# Patient Record
Sex: Female | Born: 1945
Health system: Southern US, Community
[De-identification: ages and names within clinical notes are randomized; demographics above are authoritative.]

## PROBLEM LIST (undated history)

## (undated) DIAGNOSIS — I200 Unstable angina: Secondary | ICD-10-CM

## (undated) DIAGNOSIS — I219 Acute myocardial infarction, unspecified: Secondary | ICD-10-CM

## (undated) DIAGNOSIS — J42 Unspecified chronic bronchitis: Secondary | ICD-10-CM

## (undated) DIAGNOSIS — I2510 Atherosclerotic heart disease of native coronary artery without angina pectoris: Secondary | ICD-10-CM

## (undated) DIAGNOSIS — R079 Chest pain, unspecified: Secondary | ICD-10-CM

## (undated) DIAGNOSIS — J189 Pneumonia, unspecified organism: Secondary | ICD-10-CM

## (undated) DIAGNOSIS — R7303 Prediabetes: Secondary | ICD-10-CM

## (undated) DIAGNOSIS — J449 Chronic obstructive pulmonary disease, unspecified: Secondary | ICD-10-CM

## (undated) DIAGNOSIS — D649 Anemia, unspecified: Secondary | ICD-10-CM

## (undated) DIAGNOSIS — I739 Peripheral vascular disease, unspecified: Secondary | ICD-10-CM

## (undated) DIAGNOSIS — J90 Pleural effusion, not elsewhere classified: Secondary | ICD-10-CM

## (undated) DIAGNOSIS — I771 Stricture of artery: Secondary | ICD-10-CM

## (undated) DIAGNOSIS — E785 Hyperlipidemia, unspecified: Secondary | ICD-10-CM

## (undated) DIAGNOSIS — I10 Essential (primary) hypertension: Secondary | ICD-10-CM

## (undated) DIAGNOSIS — Z9861 Coronary angioplasty status: Secondary | ICD-10-CM

## (undated) DIAGNOSIS — M1990 Unspecified osteoarthritis, unspecified site: Secondary | ICD-10-CM

## (undated) DIAGNOSIS — R911 Solitary pulmonary nodule: Secondary | ICD-10-CM

## (undated) DIAGNOSIS — I469 Cardiac arrest, cause unspecified: Secondary | ICD-10-CM

## (undated) DIAGNOSIS — R918 Other nonspecific abnormal finding of lung field: Secondary | ICD-10-CM

## (undated) DIAGNOSIS — F1110 Opioid abuse, uncomplicated: Secondary | ICD-10-CM

## (undated) DIAGNOSIS — Z720 Tobacco use: Secondary | ICD-10-CM

## (undated) DIAGNOSIS — I5042 Chronic combined systolic (congestive) and diastolic (congestive) heart failure: Secondary | ICD-10-CM

## (undated) HISTORY — PX: FRACTURE SURGERY: SHX138

## (undated) HISTORY — DX: Solitary pulmonary nodule: R91.1

## (undated) HISTORY — DX: Unstable angina: I20.0

## (undated) HISTORY — PX: ABDOMINAL HYSTERECTOMY: SHX81

## (undated) HISTORY — PX: CORONARY ANGIOPLASTY WITH STENT PLACEMENT: SHX49

## (undated) HISTORY — DX: Anemia, unspecified: D64.9

## (undated) HISTORY — DX: Pleural effusion, not elsewhere classified: J90

## (undated) HISTORY — DX: Tobacco use: Z72.0

## (undated) HISTORY — DX: Prediabetes: R73.03

## (undated) HISTORY — DX: Hyperlipidemia, unspecified: E78.5

## (undated) HISTORY — DX: Chronic obstructive pulmonary disease, unspecified: J44.9

## (undated) HISTORY — DX: Other nonspecific abnormal finding of lung field: R91.8

## (undated) HISTORY — DX: Chronic combined systolic (congestive) and diastolic (congestive) heart failure: I50.42

## (undated) HISTORY — PX: ANKLE FRACTURE SURGERY: SHX122

## (undated) HISTORY — DX: Peripheral vascular disease, unspecified: I73.9

## (undated) HISTORY — DX: Cardiac arrest, cause unspecified: I46.9

## (undated) HISTORY — DX: Chest pain, unspecified: R07.9

## (undated) HISTORY — DX: Stricture of artery: I77.1

---

## 2007-05-16 DIAGNOSIS — I219 Acute myocardial infarction, unspecified: Secondary | ICD-10-CM

## 2007-05-16 HISTORY — DX: Acute myocardial infarction, unspecified: I21.9

## 2009-01-13 DIAGNOSIS — I469 Cardiac arrest, cause unspecified: Secondary | ICD-10-CM

## 2009-01-13 HISTORY — DX: Cardiac arrest, cause unspecified: I46.9

## 2009-01-14 ENCOUNTER — Inpatient Hospital Stay (HOSPITAL_COMMUNITY): Admission: EM | Admit: 2009-01-14 | Discharge: 2009-01-16 | Payer: Self-pay | Admitting: Emergency Medicine

## 2009-01-14 ENCOUNTER — Ambulatory Visit: Payer: Self-pay | Admitting: Cardiology

## 2010-07-04 ENCOUNTER — Observation Stay (HOSPITAL_COMMUNITY)
Admission: RE | Admit: 2010-07-04 | Discharge: 2010-07-05 | Disposition: A | Discharge status: Home / Self Care | Payer: Medicare Other | Source: Ambulatory Visit | Attending: Interventional Cardiology | Admitting: Interventional Cardiology

## 2010-07-04 DIAGNOSIS — T82897A Other specified complication of cardiac prosthetic devices, implants and grafts, initial encounter: Principal | ICD-10-CM | POA: Insufficient documentation

## 2010-07-04 DIAGNOSIS — I2510 Atherosclerotic heart disease of native coronary artery without angina pectoris: Secondary | ICD-10-CM | POA: Insufficient documentation

## 2010-07-04 DIAGNOSIS — Y831 Surgical operation with implant of artificial internal device as the cause of abnormal reaction of the patient, or of later complication, without mention of misadventure at the time of the procedure: Secondary | ICD-10-CM | POA: Insufficient documentation

## 2010-07-04 LAB — POCT ACTIVATED CLOTTING TIME: Activated Clotting Time: 470 seconds

## 2010-07-05 LAB — BASIC METABOLIC PANEL
CO2: 25 mEq/L (ref 19–32)
Calcium: 9.4 mg/dL (ref 8.4–10.5)
GFR calc Af Amer: >60 mL/min (ref >60)
GFR calc non Af Amer: >60 mL/min (ref >60)
Sodium: 142 mEq/L (ref 135–145)

## 2010-07-05 LAB — CBC
Hemoglobin: 11.9 g/dL — ABNORMAL LOW (ref 12.0–15.0)
MCHC: 33.5 g/dL (ref 30.0–36.0)
RDW: 12.7 % (ref 11.5–15.5)

## 2010-07-22 NOTE — Procedures (Signed)
Kathryn Garrett, Kathryn Garrett             ACCOUNT NO.:  [id number]  MEDICAL RECORD NO.:  [id number]           PATIENT TYPE:  I  LOCATION:  6527                         FACILITY:  [facility]  PHYSICIAN:  Corky Crafts, MDDATE OF BIRTH:  1945-12-27  DATE OF PROCEDURE:  07/04/2010 DATE OF DISCHARGE:  07/05/2010                           CARDIAC CATHETERIZATION   REFERRING PHYSICIAN:  Mauricio Po, NP, [facility].  PROCEDURES PERFORMED:  Coronary angiogram, FFR of the LAD, PCI of the RCA.  OPERATOR:  Corky Crafts, MD  INDICATIONS:  Angina.  PROCEDURE NOTE:  The risks and benefits of cardiac catheterization were explained to the patient and informed consent was obtained.  She was brought to the cath lab.  She was prepped and draped in the usual sterile fashion.  Her right wrist was infiltrated with 1% lidocaine.  A 5-French glide sheath was placed in right radial artery using modified Seldinger technique.  Verapamil was given to prevent vasospasm.  Right coronary artery angiography was performed using a JR-4 pigtail catheter. The catheter was advanced to the vessel ostium under fluoroscopic guidance.  Digital angiography was performed in multiple projections using hand injection of contrast.  Left coronary artery angiography was performed using a JL-3.5 catheter in similar fashion.  A JL-3.5 guiding catheter was used.  Angiomax was given.  An FFR was performed on the disease in the distal left main and ostial LAD with adenosine been given for the stress, please see below for details.  Subsequently, the intervention of the RCA was performed.  The sheath was removed using TR band.  FINDINGS:  The left main had a distal 25% lesion.  Left circumflex is a medium-sized vessel and appeared widely patent.  The LAD had an ostial 50% lesion.  There were three small diagonals all of which were widely patent.  The distal LAD was small. The right coronary artery in the  proximal to mid RCA the bare-metal stent was noted.  There is a focal area of 75% stenosis and mild diffuse in-stent restenosis throughout the midportion of the stent.  The posterolateral artery and PDA appeared patent.  Interventional narrative JL-3.5 guiding catheter was used.  Pressure wire was placed in the LAD. Resting FFR was 0.97, after adenosine FFR was decreased to 0.89.  Intervention of the RCA was performed with a JR-4 guiding catheter. Prowater wire was placed across the lesion.  A 2.5 x 20 promise stent was used to cover the entire area of in-stent restenosis in the proximal to midportion of the old stent.  It was deployed at 14 atmospheres for 28 seconds.  This stent was postdilated with a 3.0 x 15 [place] Trek balloon inflated to 20 atmospheres for 30 seconds.  There is no residual stenosis and TIMI 3 flow was maintained throughout.  IMPRESSION: 1. Moderate left main/left anterior descending disease which was not     significant by fractional flow reserve. 2. In-stent restenosis of the bare-metal stent of the right coronary     artery.  This was successfully treated with a drug-eluting stent to     the proximal to mid right coronary artery postdilated  to greater     than 3 mm in diameter.  RECOMMENDATIONS:  Continue aspirin and Plavix for at least a year.  I will follow up with her in the office.     Corky Crafts, MD     JSV/MEDQ  D:  07/05/2010  T:  07/05/2010  Job:  [id number]  Electronically Signed by Lance Muss MD on 07/21/2010 10:32:39 AM

## 2010-07-22 NOTE — Discharge Summary (Signed)
  [name], Kathryn Garrett             ACCOUNT NO.:  [id number]  MEDICAL RECORD NO.:  [id number]           PATIENT TYPE:  I  LOCATION:  6527                         FACILITY:  [facility]  PHYSICIAN:  Corky Crafts, MDDATE OF BIRTH:  January 01, 1946  DATE OF ADMISSION:  07/04/2010 DATE OF DISCHARGE:  07/05/2010                              DISCHARGE SUMMARY   FINAL DIAGNOSES: 1. Coronary artery disease. 2. Angina. 3. Tobacco abuse. 4. Hyperlipidemia.  PROCEDURE PERFORMED:  Cardiac catheterization and drug-eluting stent placement to the right coronary artery.  FFR of the left main/LAD revealed nonsignificant disease.  PROCEDURE NOTE:  The patient underwent cardiac catheterization with the above findings.  She was admitted afterwards for observation.  She did well.  She had no further chest pain or shortness of breath.  Her right radial cath site maintain a normal pulse and had no significant bleeding issues.  She felt well and was deemed ready for discharge the day after the procedure.  DISCHARGE MEDICATIONS: 1. Aspirin 325 mg daily. 2. Celexa 20 mg daily. 3. Crestor 10 mg daily. 4. Gemfibrozil 600 mg daily. 5. Vicodin p.r.n. 6. Isosorbide 30 mg daily. 7. Metoprolol 50 mg b.i.d. 8. Plavix 75 mg daily.  DIET:  Heart-healthy diet.  ACTIVITY:  Increase activity slowly.  She may shower and bathe.  No driving for 2 days.  Follow the post cath radial instructions.  FOLLOWUP APPOINTMENT:  With Dr. Eldridge Dace on July 19, 2010 at 1:30.  LAB WORK:  Shows a creatinine of 0.62 and a potassium of 4.4, on day of discharge, hemoglobin 11.9.  SPECIAL INSTRUCTIONS:  Avoid any heavy activity with the right wrist for at least 48 hours.     Corky Crafts, MD     JSV/MEDQ  D:  07/05/2010  T:  07/05/2010  Job:  [id number]  cc:   Dema Severin, NP  Electronically Signed by Lance Muss MD on 07/21/2010 10:31:26 AM

## 2010-08-19 LAB — CBC
HCT: 30 % — ABNORMAL LOW (ref 36.0–46.0)
HCT: 32.6 % — ABNORMAL LOW (ref 36.0–46.0)
Hemoglobin: 10.1 g/dL — ABNORMAL LOW (ref 12.0–15.0)
Hemoglobin: 10.9 g/dL — ABNORMAL LOW (ref 12.0–15.0)
MCHC: 33.7 g/dL (ref 30.0–36.0)
MCV: 96.2 fL (ref 78.0–100.0)
MCV: 96.3 fL (ref 78.0–100.0)
MCV: 96.7 fL (ref 78.0–100.0)
Platelets: 224 10*3/uL (ref 150–400)
Platelets: 232 10*3/uL (ref 150–400)
RBC: 3.12 MIL/uL — ABNORMAL LOW (ref 3.87–5.11)
RBC: 3.37 MIL/uL — ABNORMAL LOW (ref 3.87–5.11)
RDW: 13 % (ref 11.5–15.5)
RDW: 13.3 % (ref 11.5–15.5)
WBC: 12 10*3/uL — ABNORMAL HIGH (ref 4.0–10.5)
WBC: 12.1 10*3/uL — ABNORMAL HIGH (ref 4.0–10.5)

## 2010-08-19 LAB — BASIC METABOLIC PANEL
BUN: 15 mg/dL (ref 6–23)
BUN: 9 mg/dL (ref 6–23)
CO2: 26 mEq/L (ref 19–32)
Chloride: 111 mEq/L (ref 96–112)
Chloride: 111 mEq/L (ref 96–112)
Creatinine, Ser: 0.56 mg/dL (ref 0.4–1.2)
Creatinine, Ser: 0.64 mg/dL (ref 0.4–1.2)
GFR calc Af Amer: >60 mL/min (ref >60)
GFR calc non Af Amer: >60 mL/min (ref >60)
Glucose, Bld: 105 mg/dL — ABNORMAL HIGH (ref 70–99)
Glucose, Bld: 114 mg/dL — ABNORMAL HIGH (ref 70–99)
Potassium: 3.8 mEq/L (ref 3.5–5.1)
Sodium: 141 mEq/L (ref 135–145)

## 2010-08-19 LAB — LIPID PANEL
LDL Cholesterol: 91 mg/dL (ref 0–99)
Triglycerides: 210 mg/dL — ABNORMAL HIGH (ref <150)

## 2010-08-19 LAB — CK TOTAL AND CKMB (NOT AT ARMC): Total CK: 754 U/L — ABNORMAL HIGH (ref 7–177)

## 2010-08-19 LAB — POCT CARDIAC MARKERS
CKMB, poc: 1.1 ng/mL (ref 1.0–8.0)
Troponin i, poc: <0.05 ng/mL (ref 0.00–0.09)

## 2010-08-19 LAB — POCT I-STAT, CHEM 8
Chloride: 107 mEq/L (ref 96–112)
HCT: 37 % (ref 36.0–46.0)
Potassium: 3.3 mEq/L — ABNORMAL LOW (ref 3.5–5.1)
Sodium: 141 mEq/L (ref 135–145)

## 2010-08-19 LAB — TROPONIN I: Troponin I: 11.46 ng/mL (ref 0.00–0.06)

## 2010-08-19 LAB — CARDIAC PANEL(CRET KIN+CKTOT+MB+TROPI): Troponin I: 8.72 ng/mL (ref 0.00–0.06)

## 2010-08-19 LAB — APTT: aPTT: 33 seconds (ref 24–37)

## 2010-08-19 LAB — TSH: TSH: 6.454 u[IU]/mL — ABNORMAL HIGH (ref 0.350–4.500)

## 2010-09-21 ENCOUNTER — Inpatient Hospital Stay: Payer: Self-pay | Admitting: Specialist

## 2010-09-21 IMAGING — CR DG CHEST 1V PORT
1 series · 1 of 1 positions shown · non-contrast
Comparison: none

REASON FOR EXAM: preop
COMMENTS:

PROCEDURE:     DXR - DXR PORTABLE CHEST SINGLE VIEW  - [DATE]  [DATE]
RESULT:     The lung fields are clear. No pneumonia, pneumothorax or pleural
effusion is seen. The heart size is normal. The mediastinal and osseous
structures show no significant abnormalities.

[view not recorded]
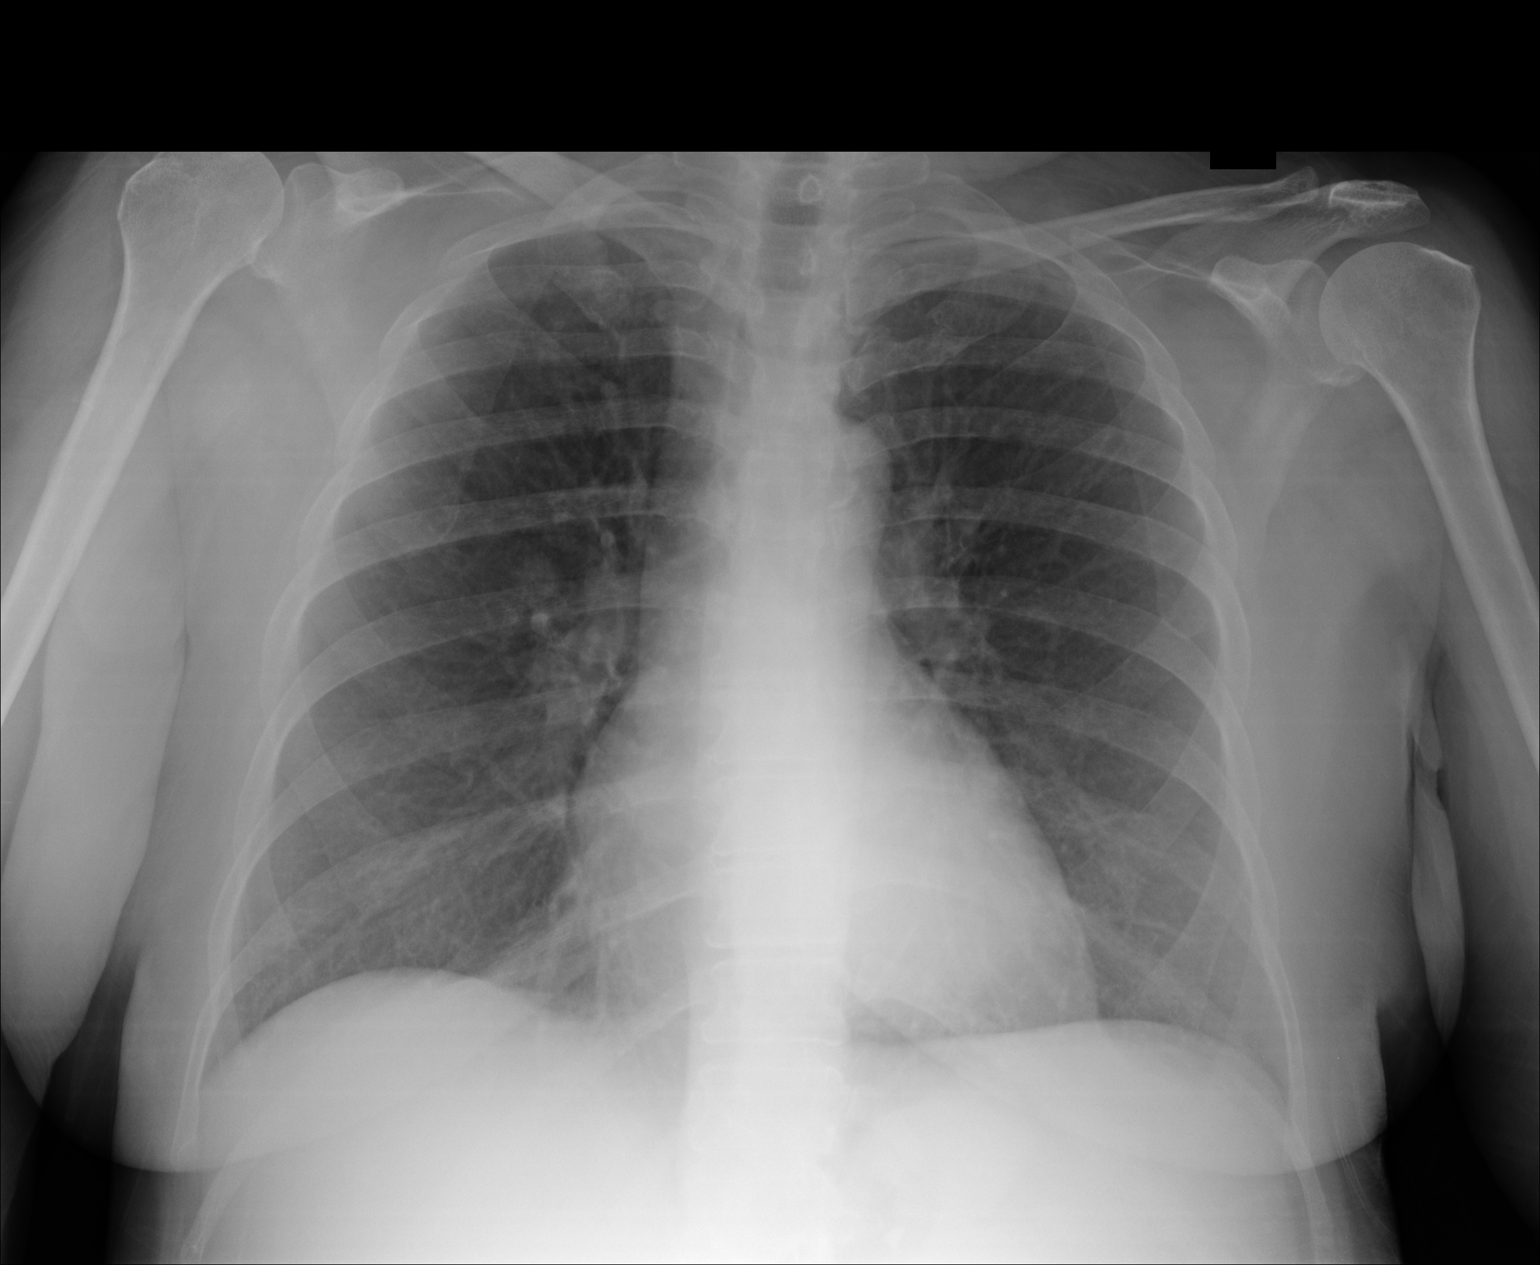

[1 of 1 positions shown; findings below may reference images not displayed]

IMPRESSION: No acute changes are identified.

## 2010-09-21 IMAGING — CR RIGHT ANKLE - COMPLETE 3+ VIEW
1 series · 5 of 5 positions shown · non-contrast
Comparison: none

REASON FOR EXAM: fall; pt in [HOSPITAL]
COMMENTS:

[Series 1: view not recorded · 0.17mm/px · 5 of 5 slices shown]
[im 1/5]
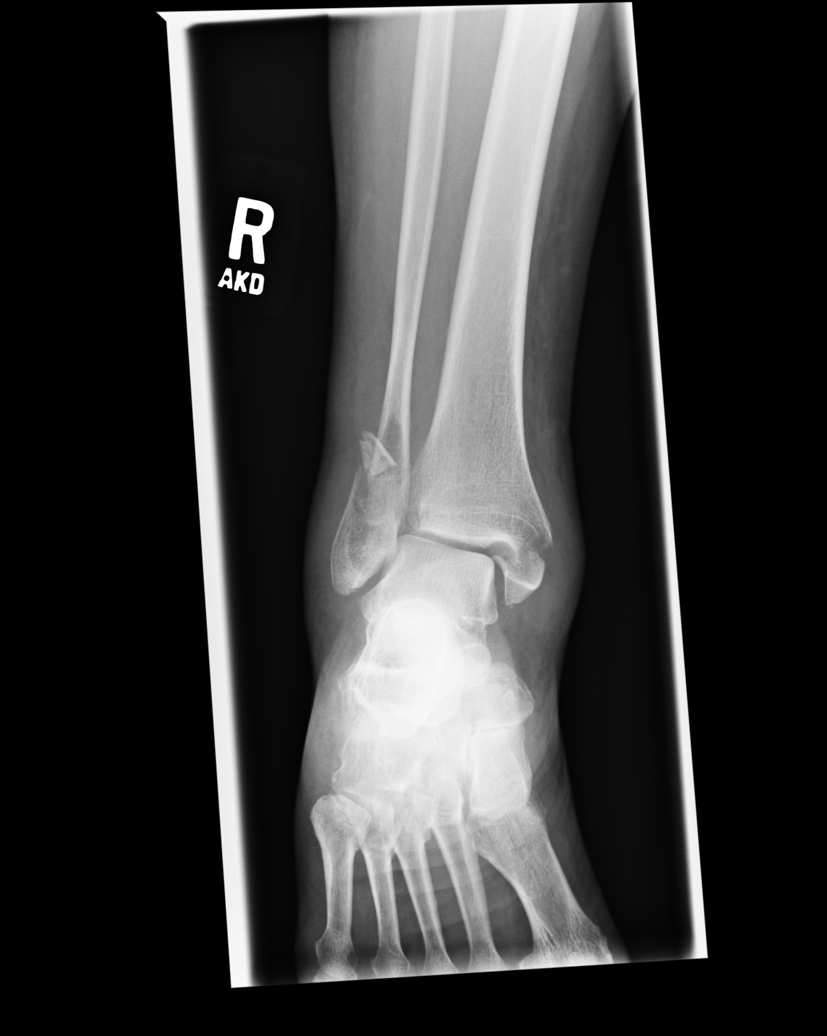
[im 2/5]
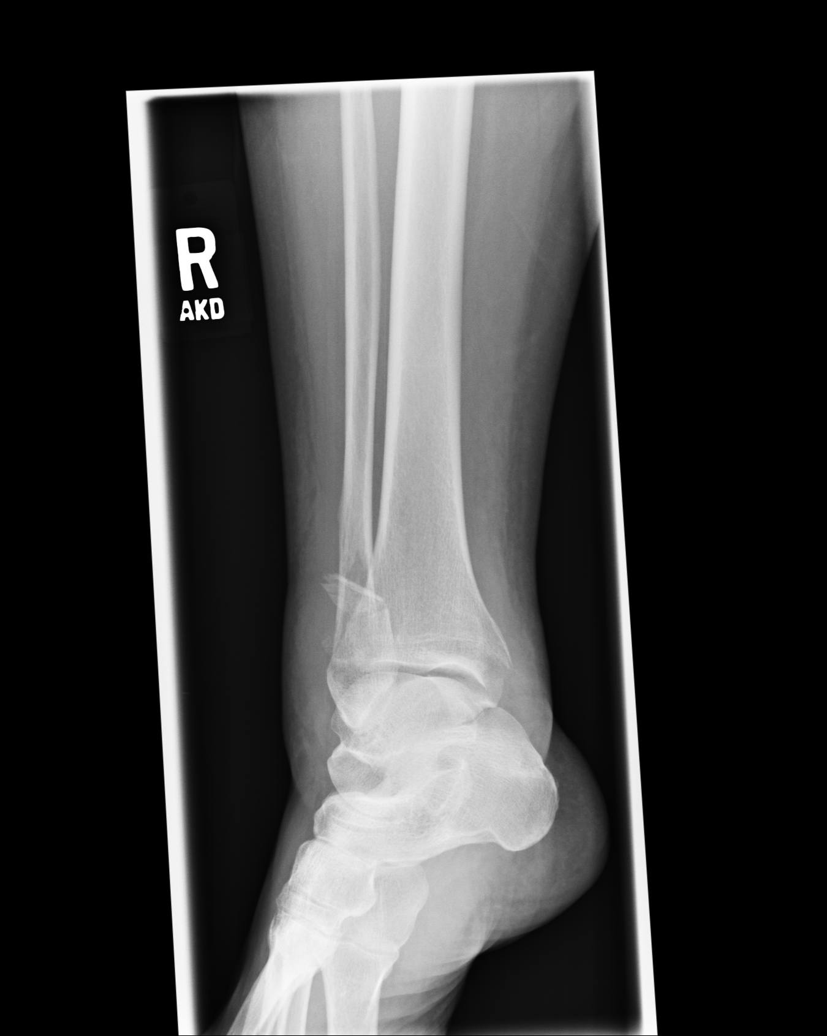
[im 3/5]
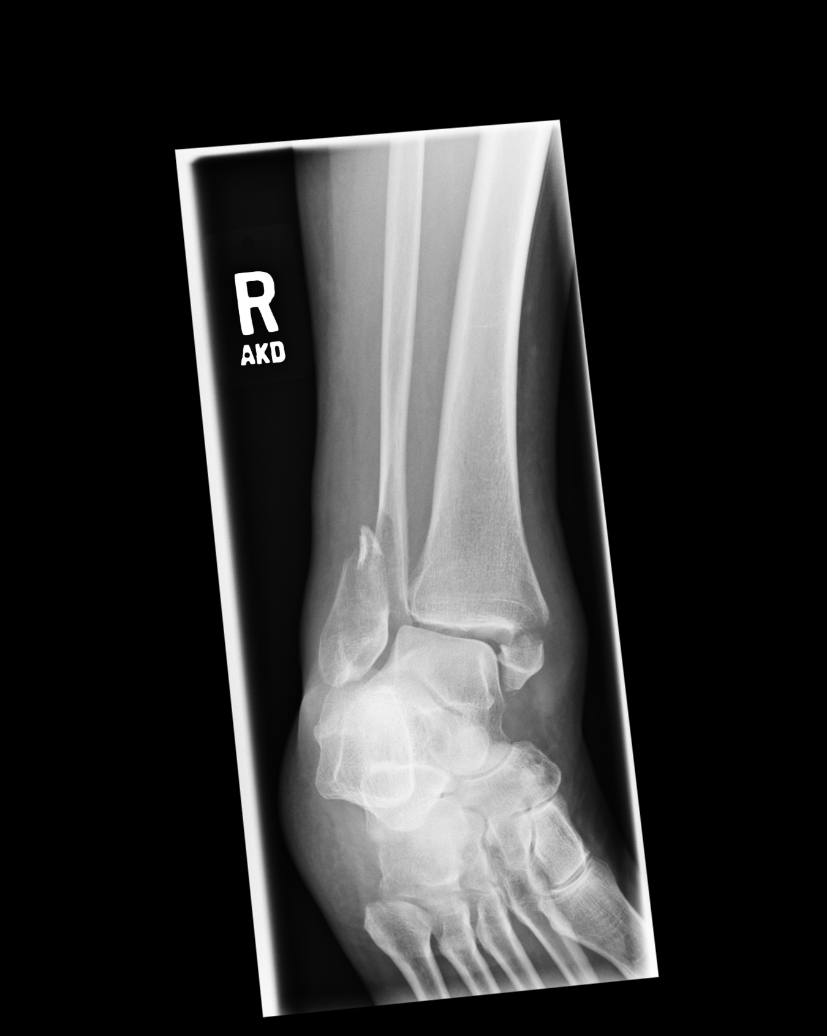
[im 4/5]
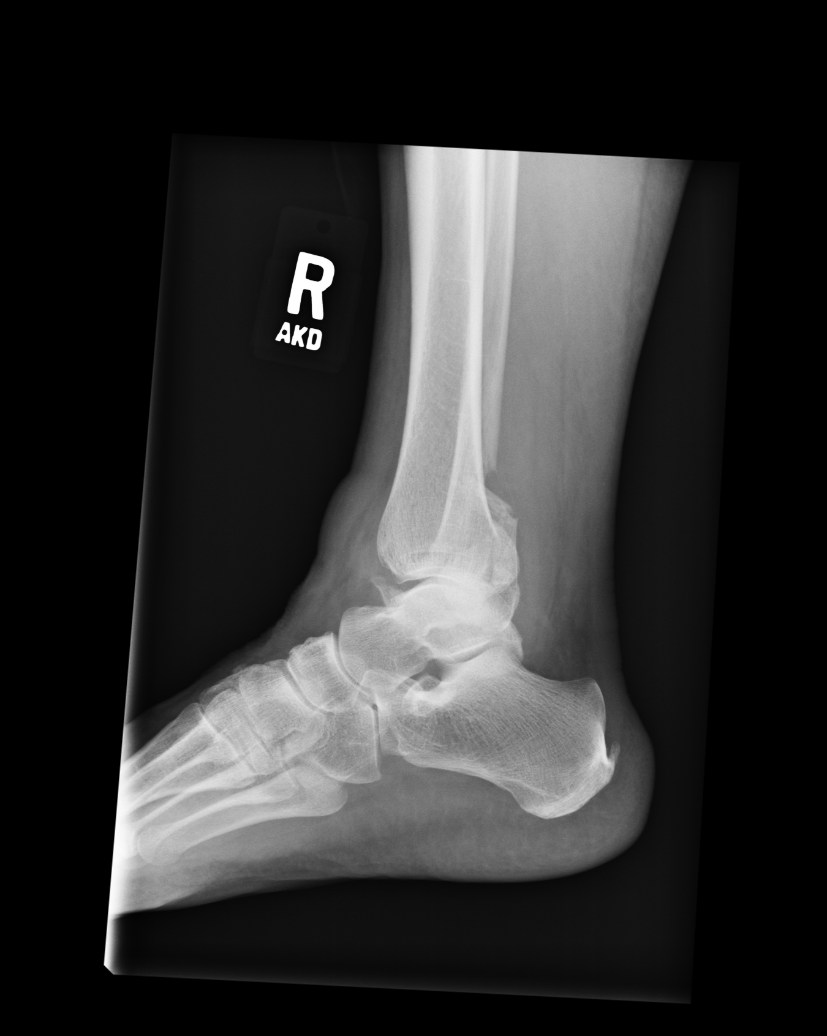
[im 5/5]
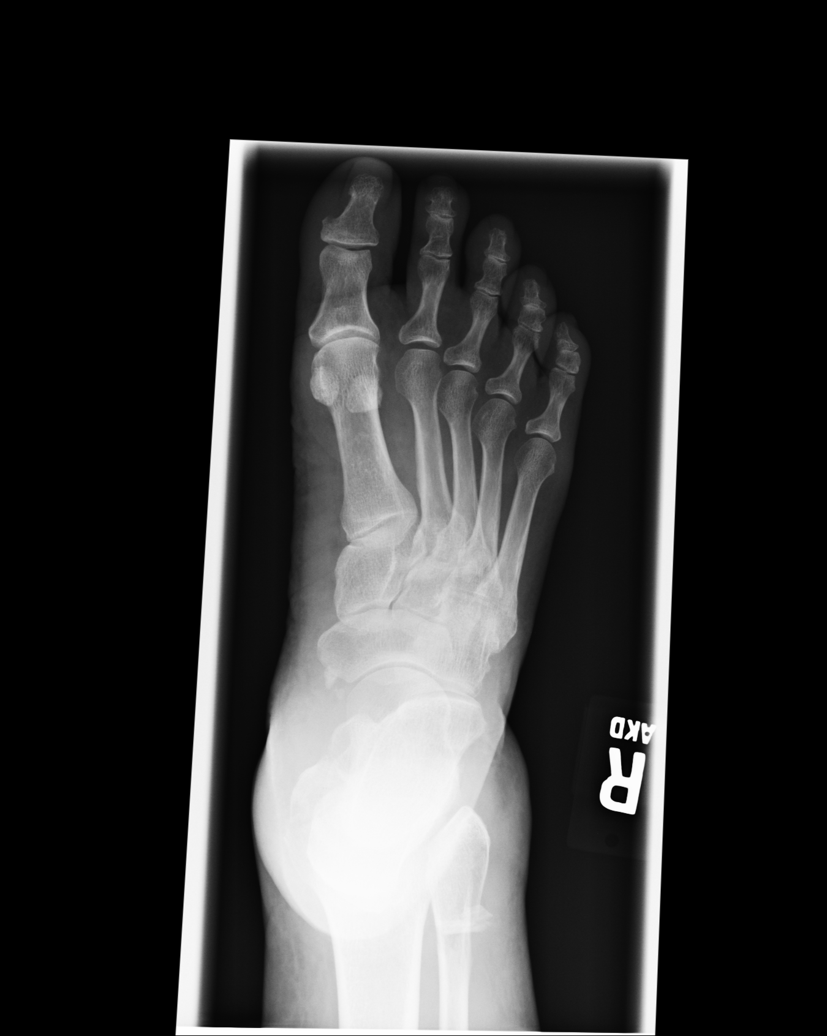

[5 of 5 positions shown; findings below may reference images not displayed]

PROCEDURE:     DXR - DXR ANKLE RIGHT COMPLETE  - [DATE]  [DATE]

RESULT:     Images of the right ankle demonstrate a fracture superior to the
distal tibiofibular articulation with comminution present in the distal
fibula. There is a fracture at the base of the medial malleolus. The talus
and fracture fragments are displaced laterally. There is evidence of a
posterior malleolus fracture as well without significant anterior or
posterior displacement. Small fracture from the anterior portion of the
distal tibia is not excluded.
IMPRESSION: Fracture-dislocation with trimalleolar fracture in the
right ankle with some comminution. Orthopedic surgical consultation and
follow-up is recommended.

## 2010-09-27 NOTE — H&P (Signed)
Kathryn Garrett, Kathryn Garrett             ACCOUNT NO.:  [id number]   MEDICAL RECORD NO.:  [id number]          PATIENT TYPE:  INP   LOCATION:  1823                         FACILITY:  [facility]   PHYSICIAN:  Darryl D. Prime, MD    DATE OF BIRTH:  1945/12/28   DATE OF ADMISSION:  01/14/2009  DATE OF DISCHARGE:                              HISTORY & PHYSICAL   The patient is full code.  She has no primary care physician that we are  aware of.  No cardiologist.  No history of coronary artery disease.   CHIEF COMPLAINT:  Chest pain.   HISTORY OF PRESENT ILLNESS:  Kathryn Garrett is a 65 year old female with a  history of gastroesophageal reflux disease, although the details of her  past medical history is unclear as she has been hemodynamically  unstable.  No prior history of coronary artery disease.  She called EMS  with chest pain at home at 1:30 a.m.  When they got out there, EKG  showed an inferior ST elevation profound, greater than 5 mm with  profound reciprocal changes laterally.  She also had ST depressions at  V1-V2 and R-wave forces strong in V2.  The patient was given aspirin  there and two sublingual nitroglycerins and transported.  Around 3 a.m.  right before she got in to the ER here en route, she went into  ventricular fibrillation and shock x1 and came out immediately  hemodynamically stable.  When she got into the ED here, she had five  episodes of V fib, full arrest and shock x5.  She received amiodarone  300, lidocaine 100 and heparin 4000 units and stabilized and brought  into the cath lab.  She did not require intubation.  She was on 100%  nonrebreather.   PAST MEDICAL HISTORY/PAST SURGICAL HISTORY:  Unsure of the details as  noted above.  She has a history of gastroesophageal reflux disease.   ALLERGIES:  SHE IS ALLERGIC TO SULFA DRUGS.   MEDICATIONS:  Unknown.   SOCIAL HISTORY:  Positive for tobacco apparently, unsure of the further  details.   FAMILY HISTORY:  Could not  obtain from the patient.   REVIEW OF SYSTEMS:  A review of systems could not be fully obtained due  to hemodynamic instability.   PHYSICAL EXAMINATION:  VITAL SIGNS:  Blood pressure 144/82, pulse 80,  respiratory rate 24, saturations 100% on 100% nonrebreather in the cath  lab.  GENERAL:  She is a female who is very drowsy and lethargic laying flat  in bed in the ER.  She is able to follow commands.  The patient had  signs of mild cyanosis, duskiness in the extremities, cool extremities.  HEENT:  Normocephalic, atraumatic.  Pupils are equal, round and reactive  to light.  The oropharynx reveals no posterior pharyngeal lesions.  NECK:  Supple with no evidence of adenopathy or thyromegaly.  No carotid  bruits.  CARDIOVASCULAR:  Shows no murmurs.  LUNGS:  Clear, although poor inspiratory effort.  ABDOMEN:  Soft and nontender.  Decreased bowel sounds.  EXTREMITIES:  Show no clubbing, cyanosis or edema.  She had  1+ pulse  bilaterally in the femoral.  NEUROLOGIC:  Again, able to answer simple questions, very drowsy.   Her EKG is as above.  She had a ventricular rate in the range of about  100.  Her PR interval was in the range of 0.122 seconds, QRS 66  milliseconds, QT corrected 396 milliseconds.  Chest x-ray is pending.  Creatinine is 0.7, hemoglobin 12.6.   ASSESSMENT/PLAN:  This is patient with no history of coronary artery  disease, who is here for an inferior ST elevation myocardial infarction,  possible inferior-posterior ST elevation myocardial infarction.  She  will be taken to the cath lab for reperfusion.  She will need  antiplatelet and anticoagulant therapy.  We will need an assessment of  her left ventricular function.  She will need statin therapy, ACE  inhibition and counseled concerning discontinuation of tobacco products  if needed.  Her sister is here and we will fill her in on what has  happened.  Gastrointestinal and deep venous thrombosis prophylaxis will  be  ordered.      Darryl D. Prime, MD  Electronically Signed     DDP/MEDQ  D:  01/14/2009  T:  01/14/2009  Job:  [id number]

## 2010-09-27 NOTE — Cardiovascular Report (Signed)
Kathryn Garrett, MAYOR             ACCOUNT NO.:  [id number]   MEDICAL RECORD NO.:  [id number]          PATIENT TYPE:  INP   LOCATION:  2902                         FACILITY:  [facility]   PHYSICIAN:  Corky Crafts, MDDATE OF BIRTH:  06/28/1945   DATE OF PROCEDURE:  01/14/2009  DATE OF DISCHARGE:                            CARDIAC CATHETERIZATION   REFERRING PHYSICIAN:  Stoney Bang, MD, [place]   PROCEDURES PERFORMED:  1. Left heart catheterization.  2. Coronary angiogram.  3. Abdominal aortogram.  4. Left ventriculogram.  5. Percutaneous coronary intervention of the right coronary artery.  6. Aspiration thrombectomy of the right coronary artery.   OPERATOR:  Corky Crafts, MD   INDICATIONS:  Acute inferoposterior MI.   PROCEDURE NARRATIVE:  The patient was brought emergently from the ER to  the cath lab.  She was prepped and draped in the usual sterile fashion.  Her right groin was infiltrated with 1% lidocaine.  A 6-French sheath  was placed into the right femoral vein using the modified Seldinger  technique.  A 6-French sheath was then placed into the right femoral  artery using modified Seldinger technique.  A JL-4 pigtail catheter was  advanced to the ostium of the left main and digital angiography was  performed in multiple projections using hand injection of contrast.  A  JR-4 guiding catheter was then advanced to the ostium of the RCA and  digital angiography was performed in multiple projections using hand  injection of contrast.  The PCI of the right coronary artery was then  performed.  Please see below for details.  The pigtail catheter was  advanced to the ascending aorta and across the aortic valve under  fluoroscopic guidance.  A power injection of contrast performed in the  RAO projection to image the left ventricle.  The catheter was pulled  back under continuous hemodynamic pressure monitoring.  The catheter was  then withdrawn to the  abdominal aorta and a power injection of contrast  was performed in the AP projection.  The sheaths were sewn in and will  be removed after the Angiomax is worn off.   FINDINGS:  The left main was widely patent proximally.  There is a 25%  distal left main stenosis that extended into the ostium of the LAD.  The left circumflex was a medium-sized vessel with mild irregularities.  There is a medium-sized OM-1 with mild irregularities proximally.  The  remainder of the circumflex is very small.  The left anterior descending had an ostial 50% stenosis as described  above with this plaque extending from the distal left main into this  vessel.  There are mild irregularities proximally.  The distal LAD was a  small vessel which gave right to left collaterals.  There are three  diagonal vessels all of which were small but widely patent.  The right coronary artery is occluded proximally.  After the PCI, it was  better seen and there was mild disease in the mid vessel.  There was a  medium-sized PDA and posterolateral artery, both of which were widely  patent.  Left ventriculogram  showed inferior hypokinesis from the apical to the  base.  The estimated ejection fraction was 40-45%.  There is apparently  2+ MR.   HEMODYNAMICS:  Ventricular pressure 85/16 with an LVEDP of 24 mmHg.  Aortic pressure 85/45 with a mean aortic pressure of 62 mmHg.  The abdominal aortogram showed mild atherosclerosis in the infrarenal  aorta.  There are bilateral single renal arteries.  The left renal had  an ostial 25% stenosis.  The right renal was widely patent.  The right  common iliac had an ostial 50% stenosis.   PERCUTANEOUS CORONARY INTERVENTION NARRATIVE:  A JR-4 guiding catheter  was used.  Prowater wire was used to cross the lesion.  A fetch catheter  was used and successfully aspirated the thrombus.  After the thrombus  aspiration, a 2.0 x 12 apex was placed across the lesion and inflated to  10  atmospheres for 19 seconds and then more proximally to 12 atmospheres  for 20 seconds, 12 atmospheres for 20 seconds, 12 atmospheres for 30  seconds.  A 2.5 x 23 Vision stent was then placed across the diseased  area and inflated to 12 atmospheres for 30 seconds.  The stent was  postdilated with a 3.0 x 20 [place] balloon inflated to 16  atmospheres for 35 seconds and then to 16 atmospheres for 15 seconds.  The initial flow was TIMI 0.  This was improved to TIMI 3.  The initial  stenosis was 100%.  This was decreased to 0%.  The lesion length was 21  mm.   IMPRESSION:  1. Acute inferoposterior myocardial infarction.  2. Moderate left main and ostial left anterior descending disease.  3. Decreased left ventricular ejection fraction at about 40-45% with      2+ mitral regurgitation.  4. Peripheral vascular disease in the infrarenal aorta and      specifically at the right common iliac.   RECOMMENDATIONS:  Continue aspirin and Plavix for at least 30 days but  likely longer.  Continue amiodarone IV given that she had 6 episodes of  ventricular fibrillation requiring defibrillation.  Encourage smoking  cessation.  We will start statin as well and watch her in the CCU.      Corky Crafts, MD  Electronically Signed     JSV/MEDQ  D:  01/14/2009  T:  01/14/2009  Job:  [id number]

## 2013-03-19 ENCOUNTER — Inpatient Hospital Stay: Payer: Self-pay | Admitting: Internal Medicine

## 2013-03-19 LAB — COMPREHENSIVE METABOLIC PANEL
Alkaline Phosphatase: 99 U/L (ref 50–136)
Anion Gap: 4 — ABNORMAL LOW (ref 7–16)
BUN: 16 mg/dL (ref 7–18)
Calcium, Total: 9.6 mg/dL (ref 8.5–10.1)
Chloride: 101 mmol/L (ref 98–107)
Co2: 28 mmol/L (ref 21–32)
EGFR (Non-African Amer.): >60
Glucose: 104 mg/dL — ABNORMAL HIGH (ref 65–99)
Osmolality: 268 (ref 275–301)
SGOT(AST): 16 U/L (ref 15–37)
SGPT (ALT): 13 U/L (ref 12–78)
Sodium: 133 mmol/L — ABNORMAL LOW (ref 136–145)

## 2013-03-19 LAB — URINALYSIS, COMPLETE
Bilirubin,UR: NEGATIVE
Leukocyte Esterase: NEGATIVE
Nitrite: NEGATIVE
Ph: 6 (ref 4.5–8.0)
Protein: 30
RBC,UR: 4 /HPF (ref 0–5)

## 2013-03-19 LAB — CK TOTAL AND CKMB (NOT AT ARMC): CK, Total: 92 U/L (ref 21–215)

## 2013-03-19 LAB — CBC
HCT: 41.7 % (ref 35.0–47.0)
HGB: 14 g/dL (ref 12.0–16.0)
Platelet: 333 10*3/uL (ref 150–440)
RDW: 12.9 % (ref 11.5–14.5)

## 2013-03-19 LAB — TROPONIN I: Troponin-I: <0.02 ng/mL

## 2013-03-19 LAB — RAPID INFLUENZA A&B ANTIGENS

## 2013-03-19 IMAGING — CR DG CHEST 2V
1 series · 2 of 2 positions shown · non-contrast
Comparison: [DATE]

CLINICAL DATA: Shortness of breath

EXAM:
CHEST  2 VIEW

[Series 1: pa · 0.17mm/px · 2 of 2 slices shown]
[im 1/2]
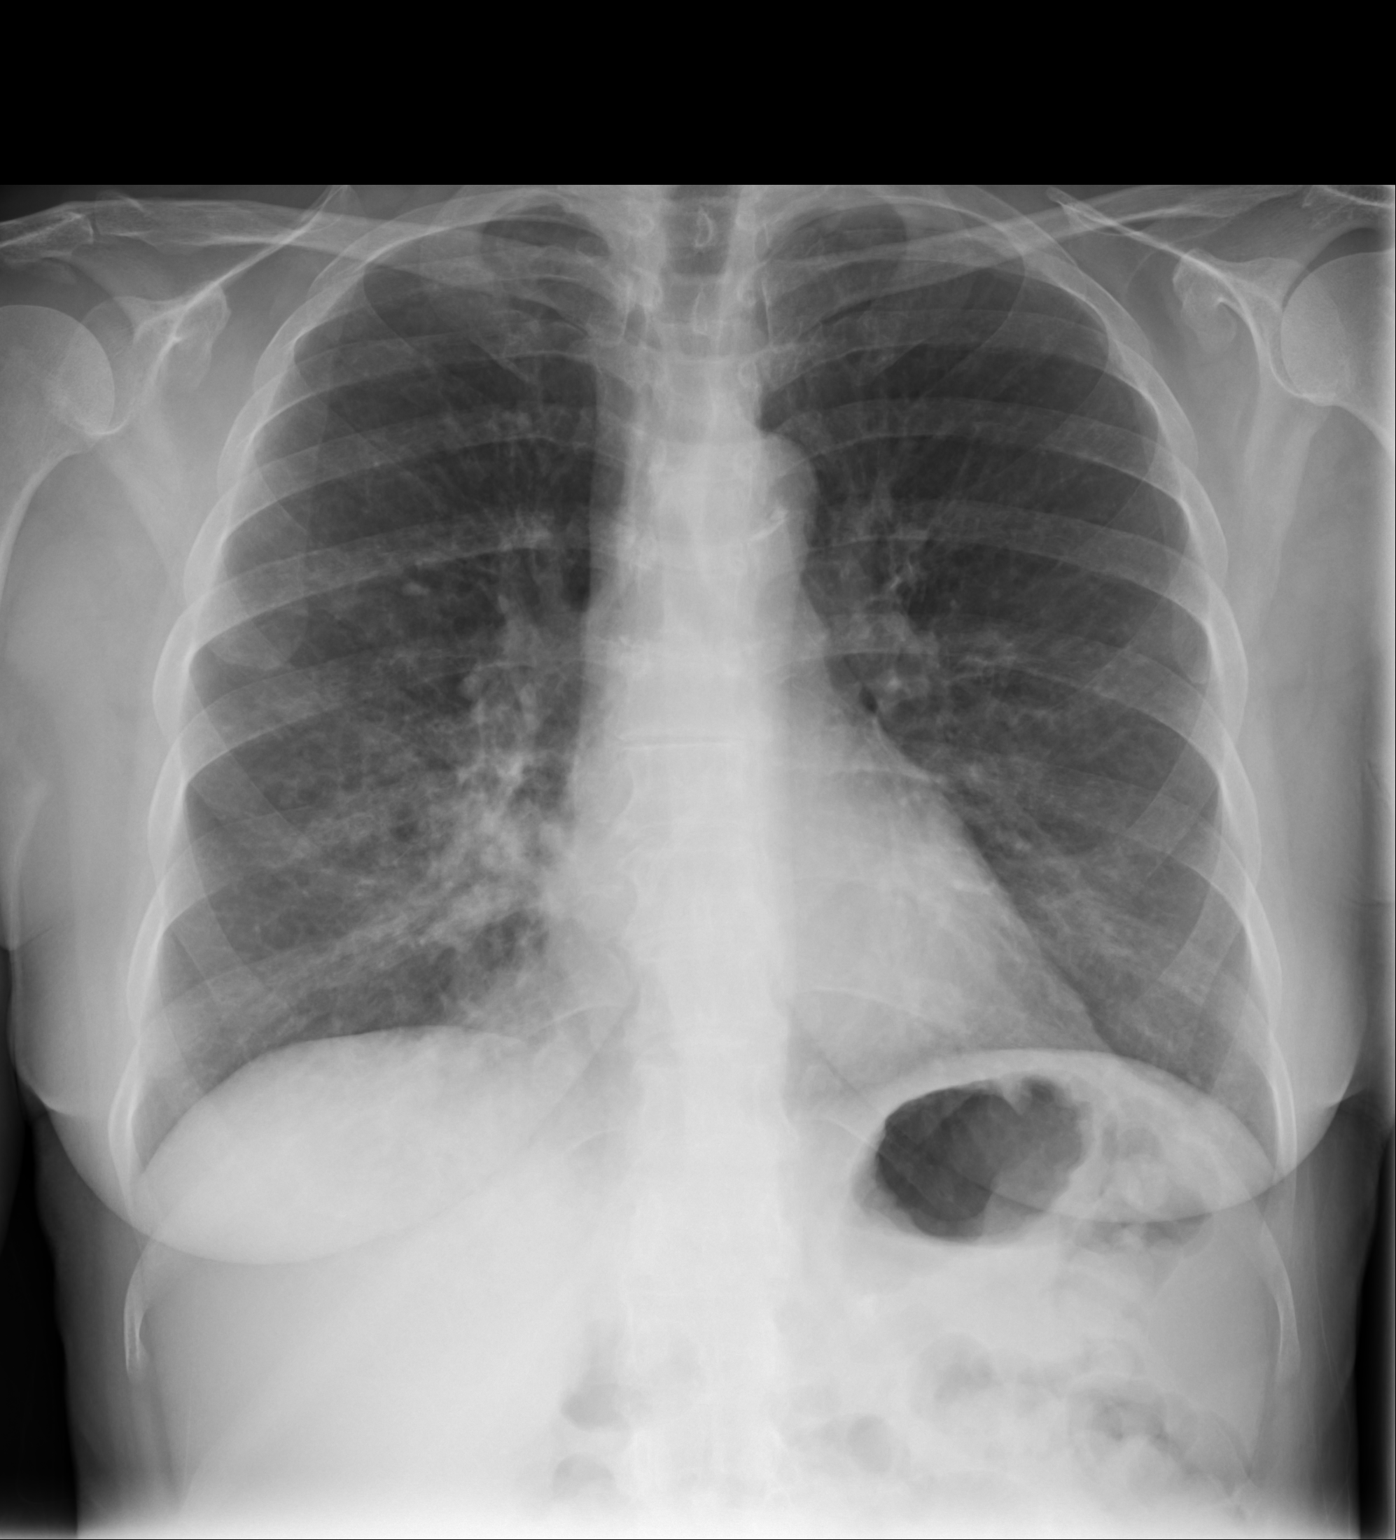
[im 2/2]
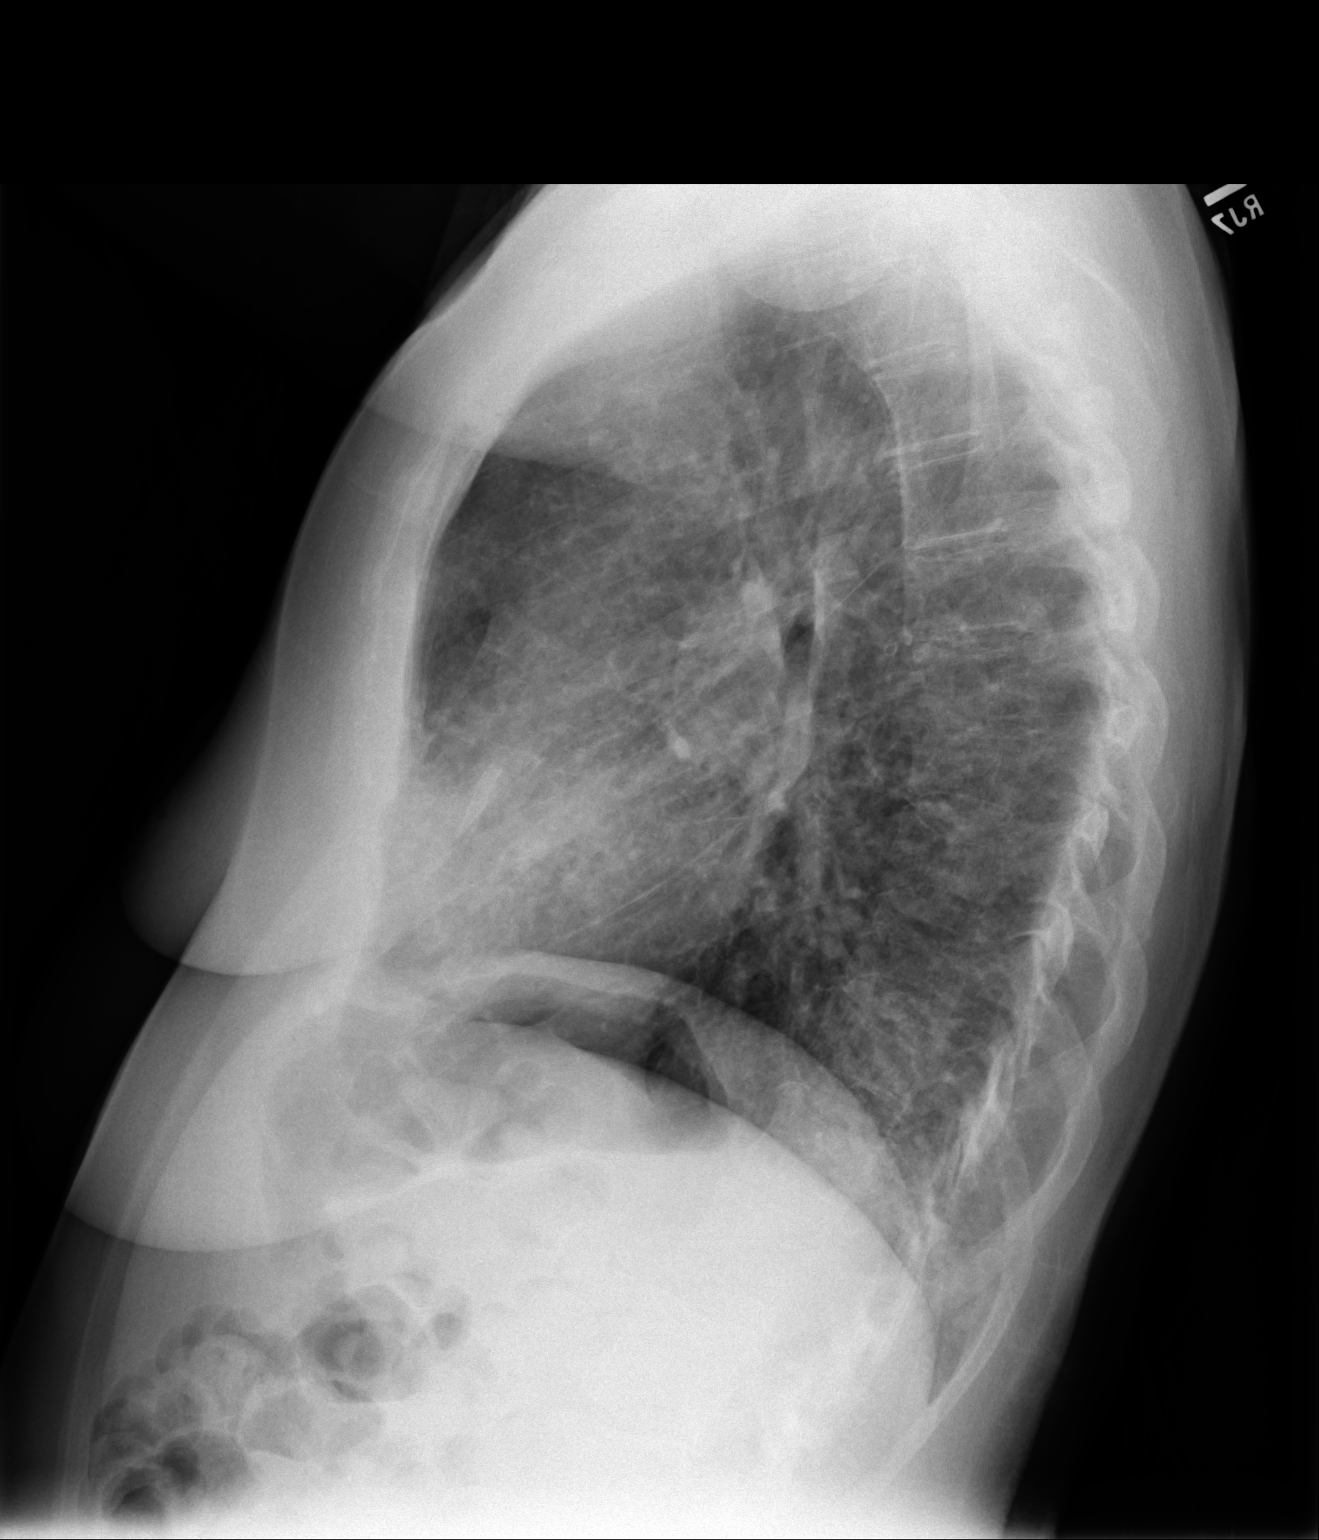

[2 of 2 positions shown; findings below may reference images not displayed]

FINDINGS: Normal heart size, mediastinal contours, and pulmonary vascularity.

Right middle lobe consolidation consistent with pneumonia.

Atherosclerotic calcification aorta.

Minimal peribronchial thickening.

Remaining lungs clear.

No pleural effusion or pneumothorax.

Minimal endplate spur formation thoracic spine.

Coronary arterial stent noted.
IMPRESSION: Right middle lobe consolidation consistent with pneumonia.

## 2013-03-20 LAB — CBC WITH DIFFERENTIAL/PLATELET
Basophil #: 0 10*3/uL (ref 0.0–0.1)
Eosinophil #: 0 10*3/uL (ref 0.0–0.7)
Eosinophil %: 0.3 %
HGB: 10.4 g/dL — ABNORMAL LOW (ref 12.0–16.0)
Lymphocyte #: 1.4 10*3/uL (ref 1.0–3.6)
Lymphocyte %: 13.7 %
MCH: 33.1 pg (ref 26.0–34.0)
Monocyte #: 1.4 x10 3/mm — ABNORMAL HIGH (ref 0.2–0.9)
Neutrophil #: 7.1 10*3/uL — ABNORMAL HIGH (ref 1.4–6.5)
Neutrophil %: 71.3 %
RBC: 3.14 10*6/uL — ABNORMAL LOW (ref 3.80–5.20)
RDW: 13.1 % (ref 11.5–14.5)
WBC: 9.9 10*3/uL (ref 3.6–11.0)

## 2013-03-20 LAB — LIPID PANEL
HDL Cholesterol: 21 mg/dL — ABNORMAL LOW (ref 40–60)
Ldl Cholesterol, Calc: 42 mg/dL (ref 0–100)
Triglycerides: 112 mg/dL (ref 0–200)

## 2013-03-20 LAB — BASIC METABOLIC PANEL
BUN: 12 mg/dL (ref 7–18)
Chloride: 107 mmol/L (ref 98–107)
Creatinine: 0.7 mg/dL (ref 0.60–1.30)
EGFR (African American): >60
Glucose: 131 mg/dL — ABNORMAL HIGH (ref 65–99)
Potassium: 3.8 mmol/L (ref 3.5–5.1)
Sodium: 138 mmol/L (ref 136–145)

## 2013-03-20 LAB — TSH: Thyroid Stimulating Horm: 1.87 u[IU]/mL

## 2013-03-22 IMAGING — CT CT CHEST W/O CM
2 of 3 series · 15 of 36 positions shown, 18 images · non-contrast
Comparison: Chest radiograph dated [DATE]

CLINICAL DATA: Follow-up pneumonia

EXAM:
CT CHEST WITHOUT CONTRAST
TECHNIQUE: Multidetector CT imaging of the chest was performed following the
standard protocol without IV contrast.

[Series 2: routine chest wo · axial · 0.55mm/px · z∈[-607,-322]mm · 12 of 67 slices shown, 15 images]
[im 5/67  mediastinal]
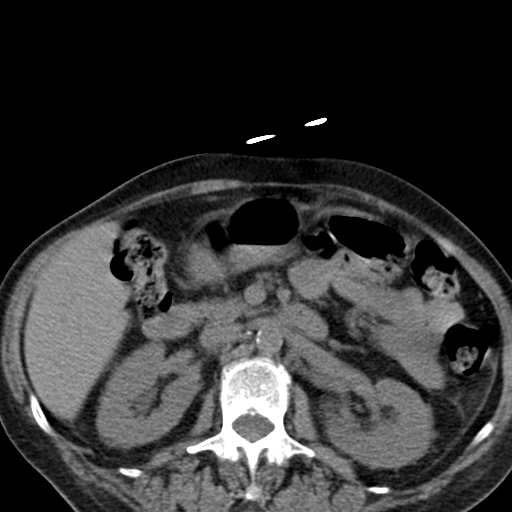
[im 5/67  lung]
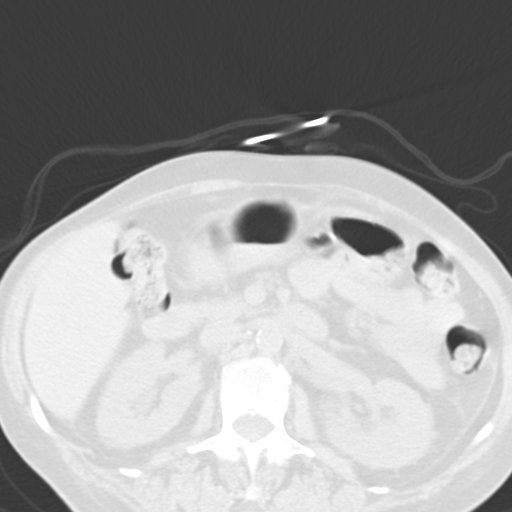
[im 10/67  lung]
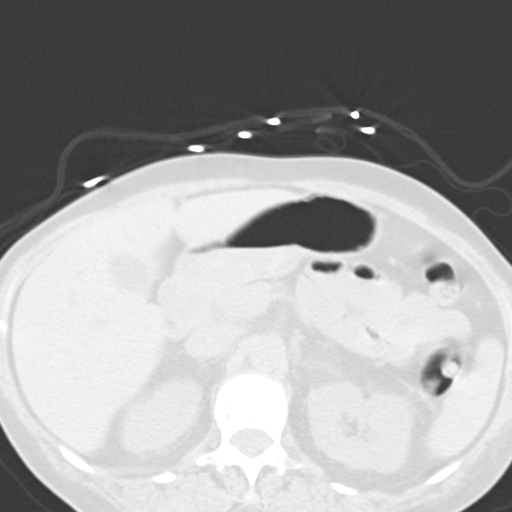
[im 15/67  lung]
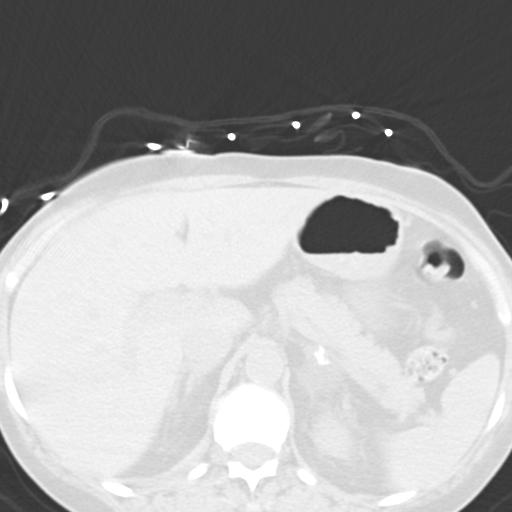
[im 20/67  lung]
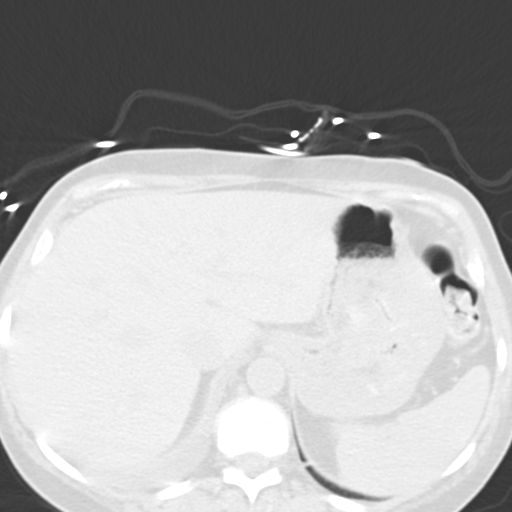
[im 25/67  mediastinal]
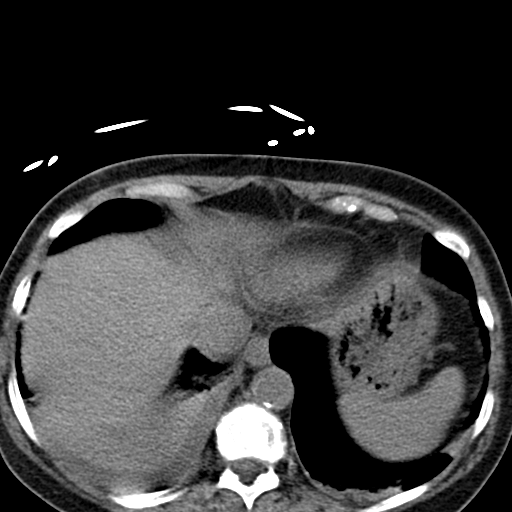
[im 25/67  lung]
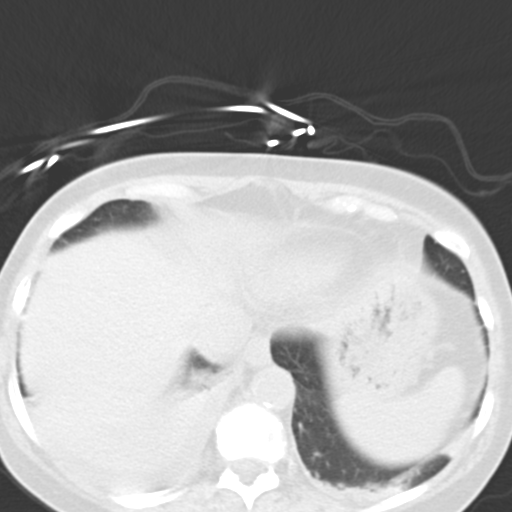
[im 30/67  lung]
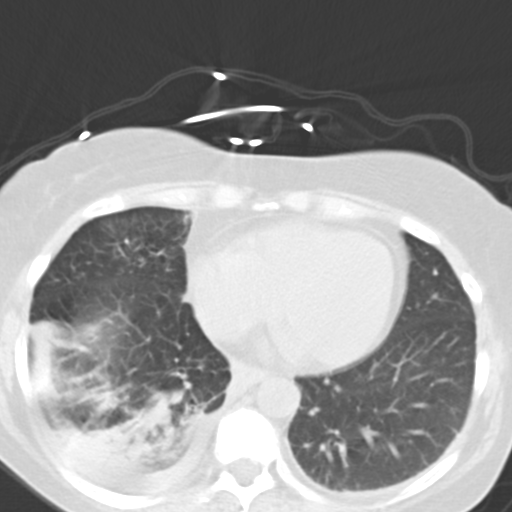
[im 37/67  lung]
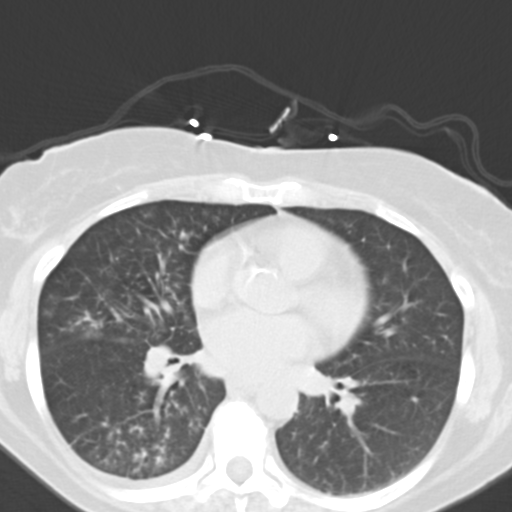
[im 42/67  lung]
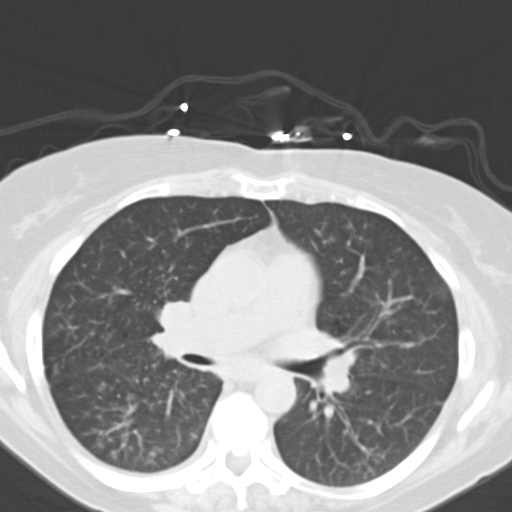
[im 47/67  mediastinal]
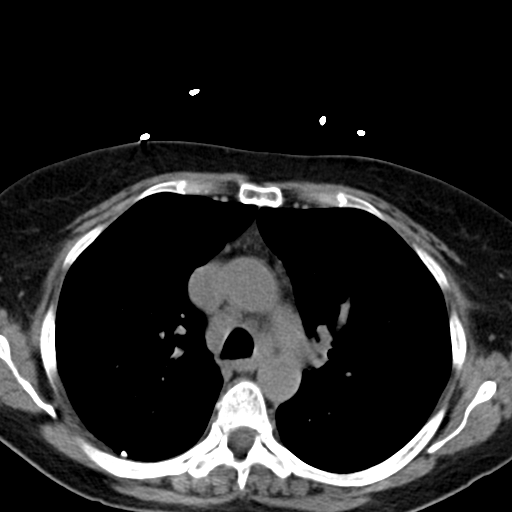
[im 47/67  lung]
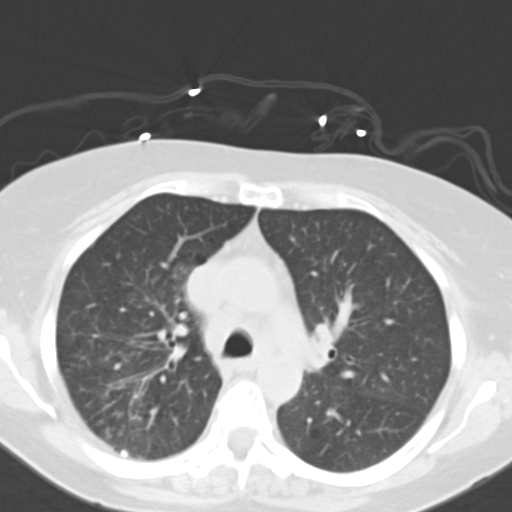
[im 52/67  lung]
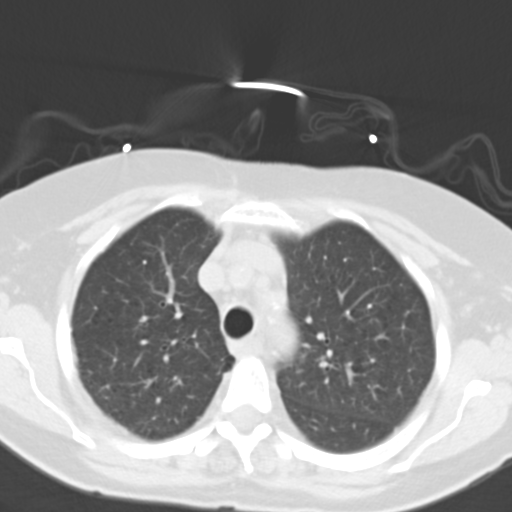
[im 57/67  lung]
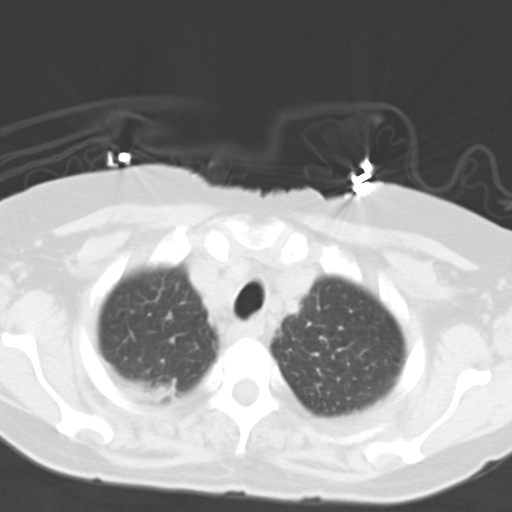
[im 62/67  lung]
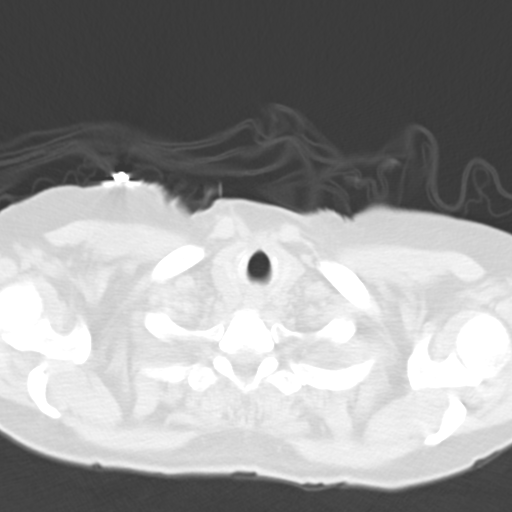

[Series 5: cor routine chest wo · coronal · 0.55mm/px · 3 of 102 slices shown]
[im 21/102  lung]
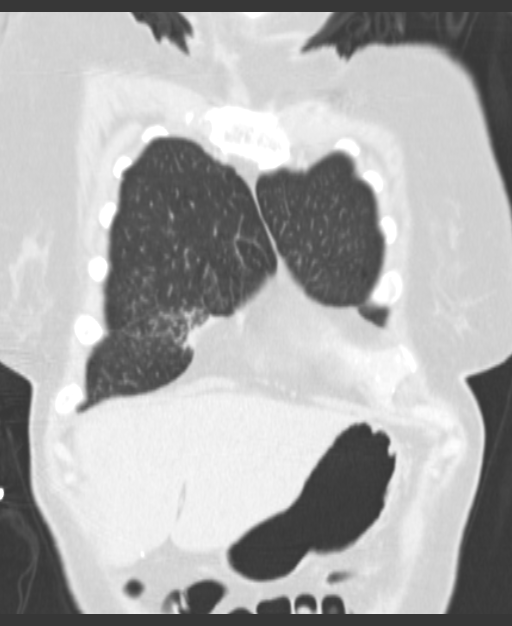
[im 41/102  lung]
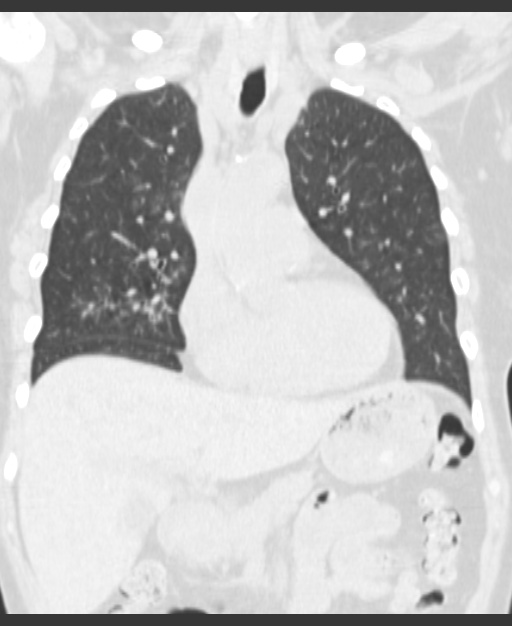
[im 61/102  lung]
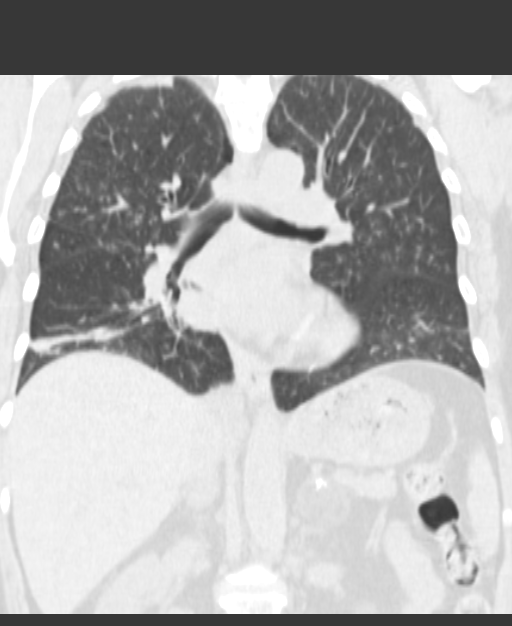

[15 of 36 positions shown; findings below may reference images not displayed]

FINDINGS: Mild patchy opacity/consolidation in the posterior right lower lobe,
suspicious for pneumonia. Associated small right pleural effusion.

Additional faint tree-in-bud nodularity throughout all lobes, right
middle/lower lobe predominant, likely infectious.

Underlying 7 x 6 mm anterior right upper lobe pulmonary nodule
(series 3/ image 19). No pneumothorax.

Visualized thyroid is unremarkable.

Heart is normal in size. No pericardial effusion. Atherosclerotic
calcifications of the aortic arch.

Small mediastinal lymph nodes, likely reactive.

Visualized upper abdomen is notable for a 3.1 cm left adrenal
myelolipoma.

Degenerative changes of the visualized thoracolumbar spine.
IMPRESSION: Patchy opacity/consolidation in the right lower lobe, suspicious for
pneumonia. Associated small right pleural effusion.

Additional faint tree-in-bud nodularity throughout all lobes, likely
infectious.

Underlying 7 mm right upper lobe pulmonary nodule. If high risk for
primary bronchogenic neoplasm, follow-up CT chest is suggested in
3-6 months. If low risk, follow-up CT chest is suggested in 6-12
months.

3.1 cm left adrenal myelolipoma.

Above recommendation follows the consensus statement: Guidelines for
Management of Small Pulmonary Nodules Detected on CT Scans: A
Statement from the [HOSPITAL] as published in Radiology

## 2013-03-23 LAB — CBC WITH DIFFERENTIAL/PLATELET
Basophil #: 0.1 10*3/uL (ref 0.0–0.1)
Eosinophil #: 0 10*3/uL (ref 0.0–0.7)
HCT: 31.5 % — ABNORMAL LOW (ref 35.0–47.0)
Lymphocyte #: 2.7 10*3/uL (ref 1.0–3.6)
Lymphocyte %: 14.7 %
MCH: 32.3 pg (ref 26.0–34.0)
MCHC: 33.8 g/dL (ref 32.0–36.0)
MCV: 96 fL (ref 80–100)
Monocyte #: 1.6 x10 3/mm — ABNORMAL HIGH (ref 0.2–0.9)
Monocyte %: 8.6 %
Neutrophil #: 13.9 10*3/uL — ABNORMAL HIGH (ref 1.4–6.5)
Neutrophil %: 76.2 %
Platelet: 398 10*3/uL (ref 150–440)
RBC: 3.3 10*6/uL — ABNORMAL LOW (ref 3.80–5.20)
RDW: 13 % (ref 11.5–14.5)
WBC: 18.2 10*3/uL — ABNORMAL HIGH (ref 3.6–11.0)

## 2013-03-23 LAB — BASIC METABOLIC PANEL
Anion Gap: 3 — ABNORMAL LOW (ref 7–16)
Co2: 30 mmol/L (ref 21–32)
EGFR (Non-African Amer.): >60
Glucose: 94 mg/dL (ref 65–99)
Osmolality: 278 (ref 275–301)
Sodium: 138 mmol/L (ref 136–145)

## 2013-03-24 LAB — CULTURE, BLOOD (SINGLE)

## 2013-03-24 LAB — WBC: WBC: 17.7 10*3/uL — ABNORMAL HIGH (ref 3.6–11.0)

## 2013-03-25 ENCOUNTER — Emergency Department: Payer: Self-pay | Admitting: Emergency Medicine

## 2013-03-25 IMAGING — CR DG TIBIA/FIBULA 2V*L*
1 series · 2 of 2 positions shown · non-contrast
Comparison: None.

CLINICAL DATA: Proximal left lower leg puncture wound following a
fall today.

EXAM:
LEFT TIBIA AND FIBULA - 2 VIEW

[Series 1: x tib-fib ap left · 0.14mm/px · 2 of 2 slices shown]
[im 1/2]
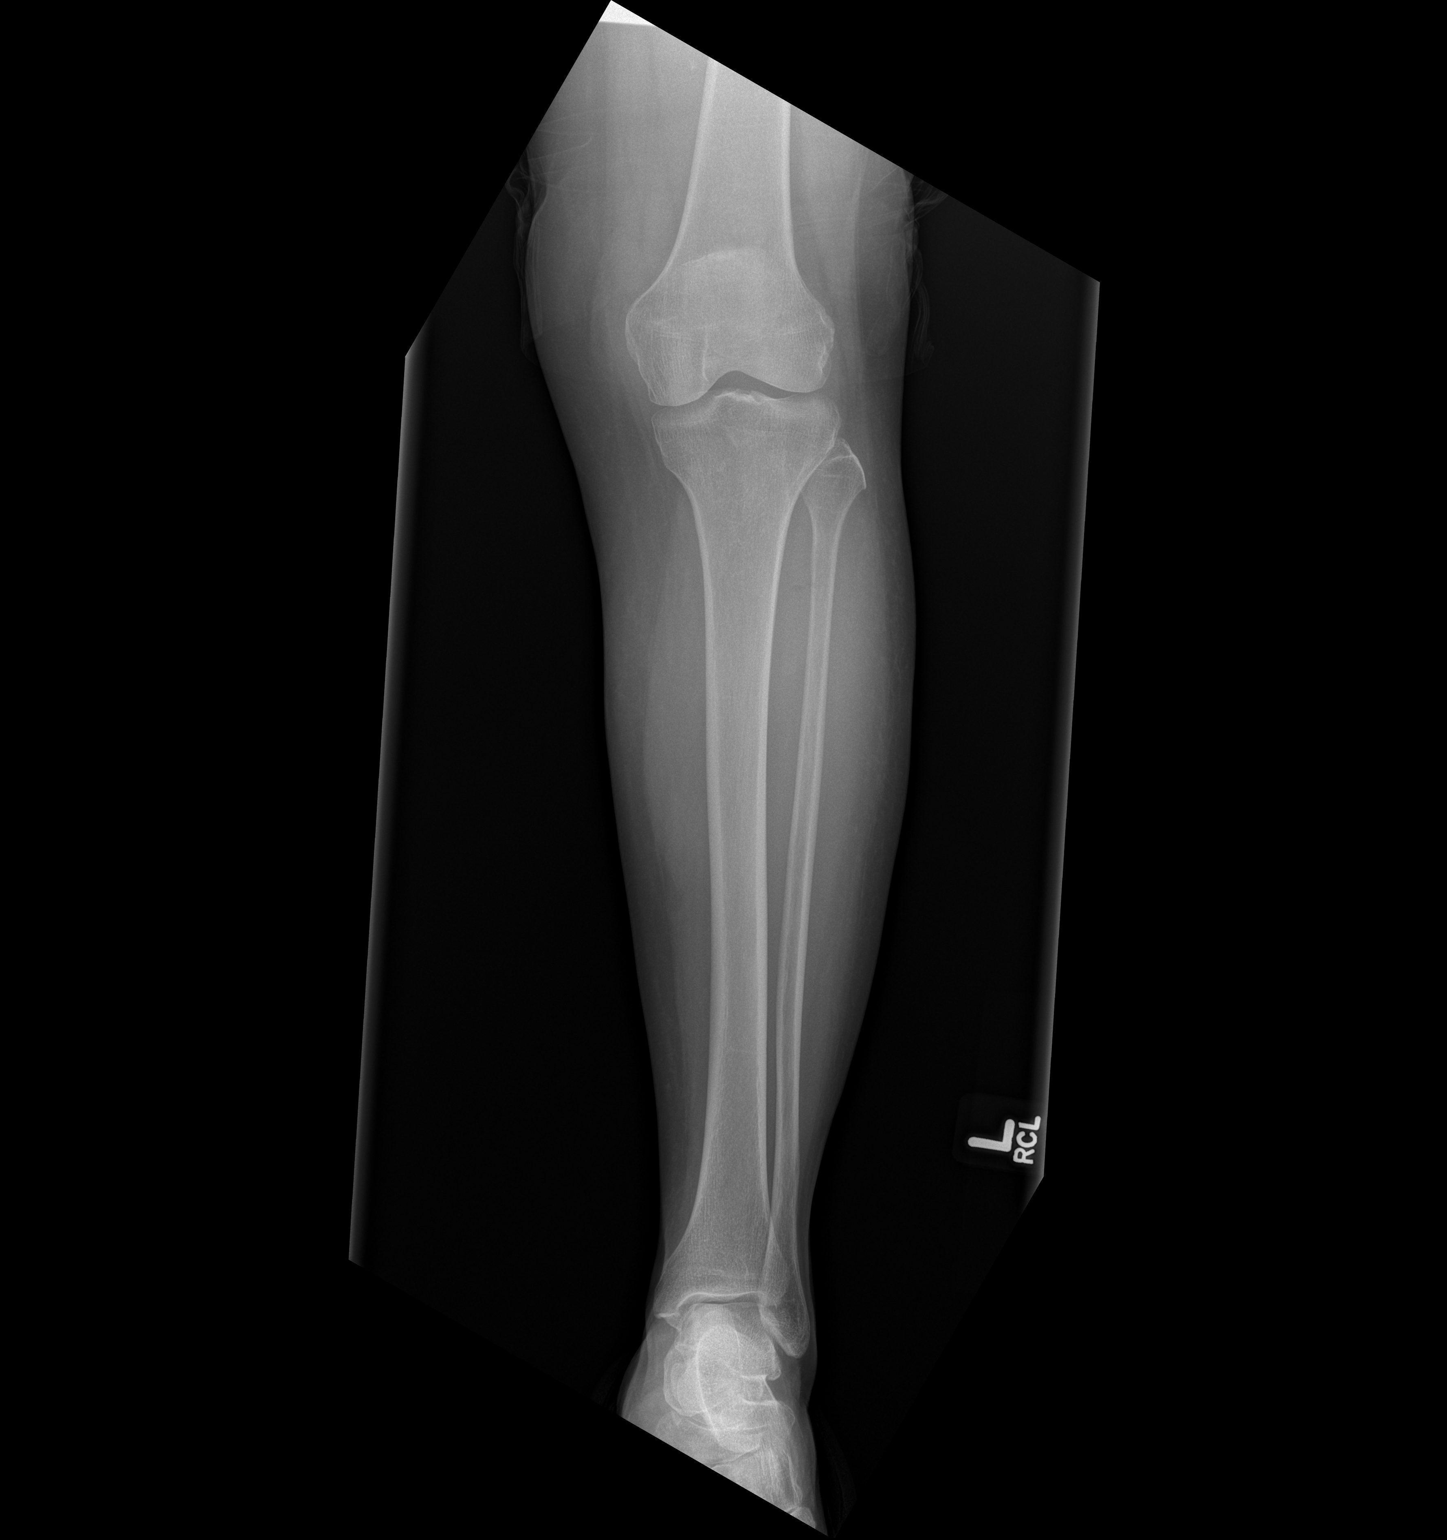
[im 2/2]
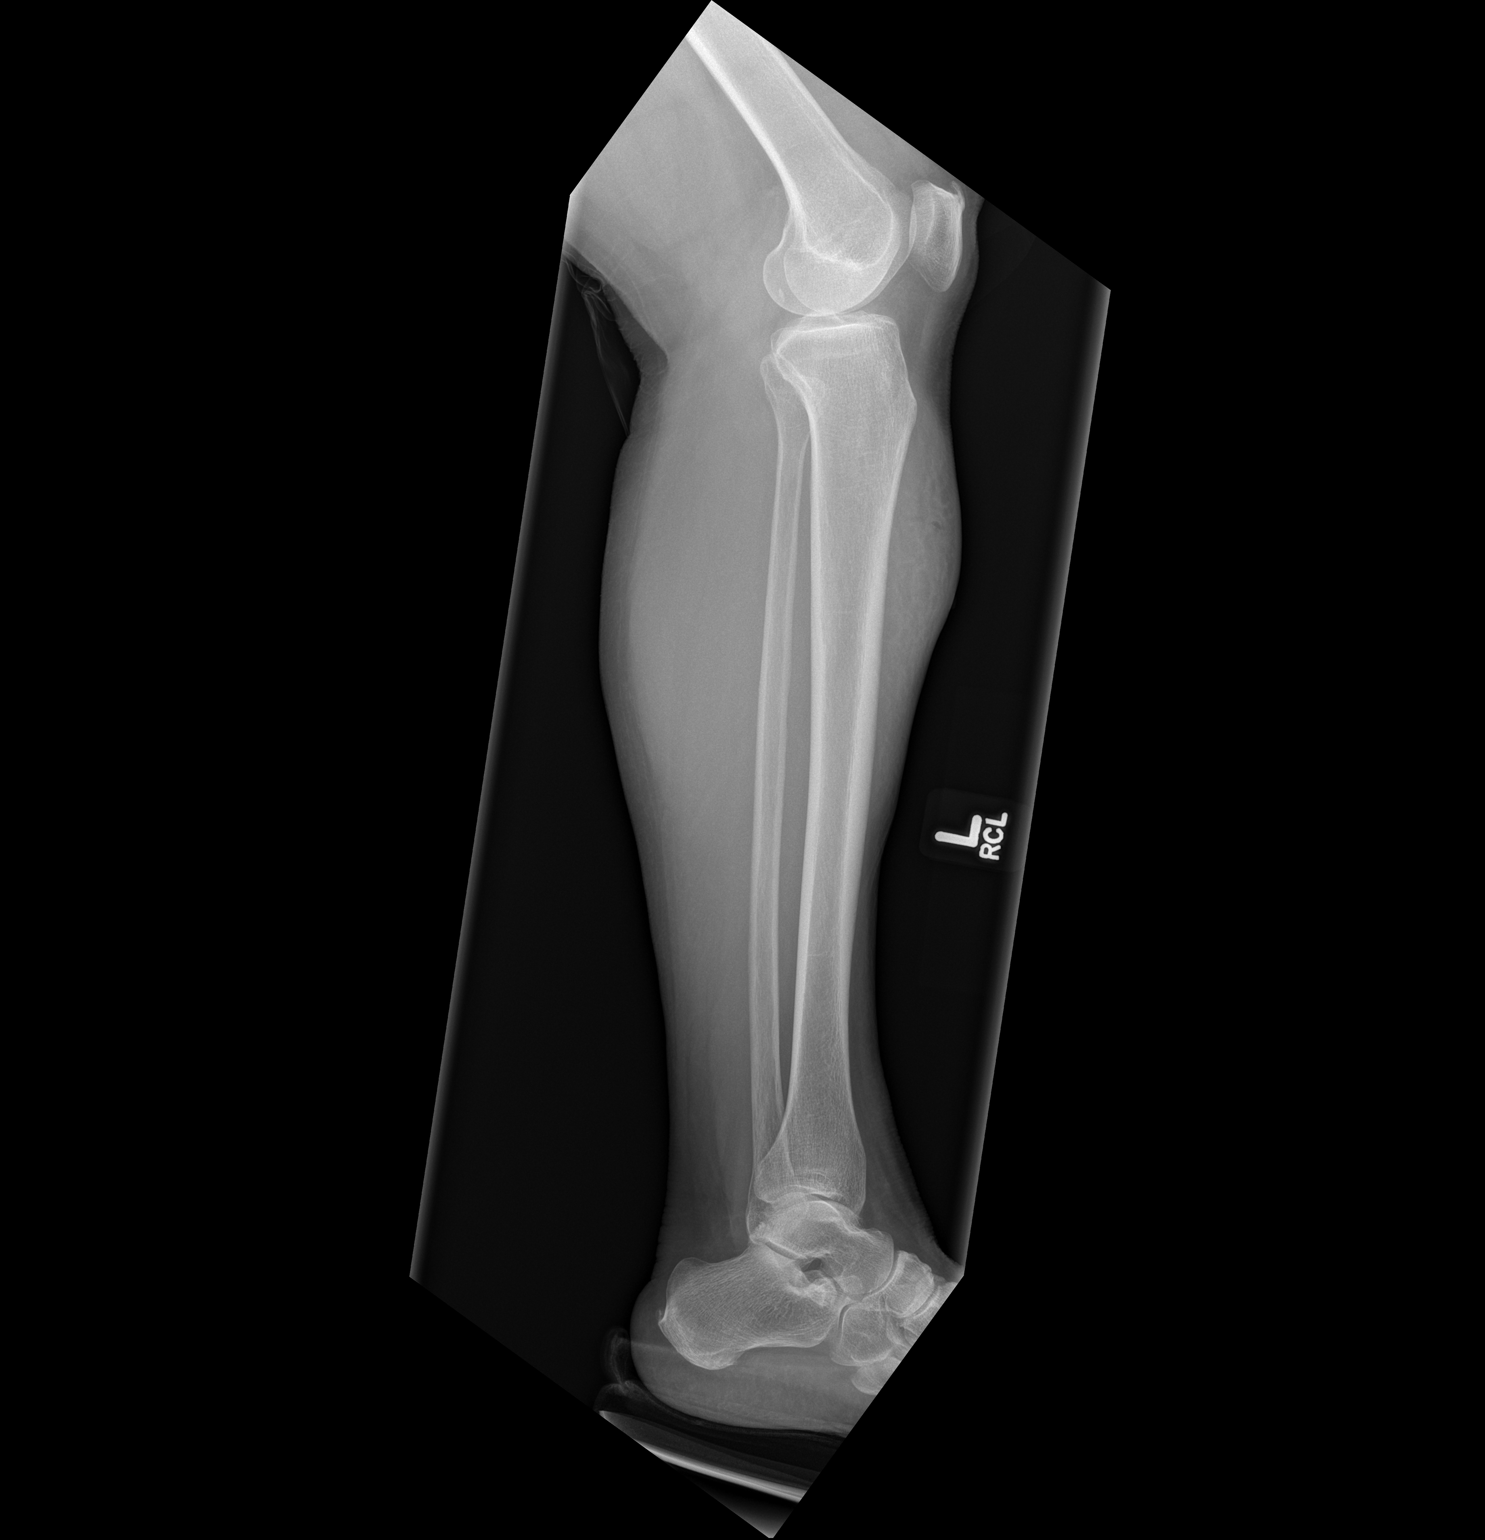

[2 of 2 positions shown; findings below may reference images not displayed]

FINDINGS: Anterior soft tissue swelling in the proximal left lower leg. There
is also a linear collection of air within the swelling, compatible
with the site of puncture wound. No fracture or radiopaque foreign
body seen.
IMPRESSION: No fracture or radiopaque foreign body. Anterior soft tissue
swelling and air.

## 2014-01-27 LAB — PULMONARY FUNCTION TEST

## 2014-03-31 DIAGNOSIS — K7689 Other specified diseases of liver: Secondary | ICD-10-CM | POA: Insufficient documentation

## 2014-03-31 DIAGNOSIS — R911 Solitary pulmonary nodule: Secondary | ICD-10-CM | POA: Insufficient documentation

## 2014-09-04 NOTE — H&P (Signed)
PATIENT NAME:  Kathryn Garrett, Kathryn Garrett MR#:  [id number] DATE OF BIRTH:  12/16/1945  DATE OF ADMISSION:  03/19/2013  PRIMARY CARE PHYSICIAN: None local.   REFERRING PHYSICIAN:  Dr. Karma Greaser   CHIEF COMPLAINT: Cough, sputum, shortness of breath, fever for 3 days.   HISTORY OF PRESENT ILLNESS:  A 69 year old Caucasian female with a history of CAD, cardiac arrest, depression and hyperlipidemia presented to ED with cough, sputum, shortness of breath for 3 days. The patient is alert, awake, oriented, in no acute distress. The patient said that she got a cold then started to have cough, sputum, shortness of breath and fever for the past 3 days. The symptoms have been worsening, so she came to the ED for further evaluation. The patient's O2 saturation was in the 90s in the ED and her x-rays show infiltrates.  She was treated with Levaquin in the ED.   PAST MEDICAL HISTORY: CAD, cardiac arrest 6 times, depression and hyperlipidemia.   PAST SURGICAL HISTORY: Hysterectomy.  FAMILY HISTORY: Brother and sister have CAD. Father died of heart attack at 68.   SOCIAL HISTORY: The patient has smoked 1 pack a day since 69 years old. Denies any alcohol drinking or illicit drugs.   ALLERGIES: None.  HOME MEDICATIONS: Plavix 75 mg p.o. daily, Lopressor 50 mg p.o. b.i.d., aspirin 325 mg p.o. daily.   REVIEW OF SYSTEMS: CONSTITUTIONAL: The patient had a fever, chills. No headache or dizziness, but has weakness.  EYES: No double vision or blurry vision.  ENT: No postnasal drip, slurred speech or dysphagia.  CARDIOVASCULAR: No chest pain, palpitation, orthopnea, or nocturnal dyspnea. No leg edema.  PULMONARY: Positive for cough, shortness of breath, sputum, but no hemoptysis.  GASTROINTESTINAL: No abdominal pain, nausea, vomiting or diarrhea. No melena or bloody stool.  GENITOURINARY: No dysuria, hematuria, or incontinence.  SKIN: No rash or jaundice.  NEUROLOGY: No syncope, loss of consciousness or seizure.   HEMATOLOGY: No easy bruising or bleeding.  ENDOCRINE: No polyuria, polydipsia, heat or cold intolerance.  SKIN: No rash or jaundice.   PHYSICAL EXAMINATION:  VITAL SIGNS:  Temperature was 101, blood pressure is 160/70, O2 saturation 95% on oxygen.  GENERAL: The patient is alert, awake, oriented, in no acute distress.  HEENT: Pupils round, equal and reactive to light and accommodation. Moist oral mucosa. Clear oropharynx.  NECK: Supple. No JVD or carotid bruit. No lymphadenopathy. No thyromegaly.  CARDIOVASCULAR: S1, S2 regular rate and rhythm. No murmurs or gallops.  PULMONARY: Bilateral air entry. Bilateral crackles especially on the left side, but no wheezing. No rales. No use of accessory muscle to breathe.  ABDOMEN: Soft. No distention or tenderness. No organomegaly. Bowel sounds present.  EXTREMITIES: No edema, clubbing or cyanosis. No calf tenderness. Strong bilateral pedal pulses.  SKIN: No rash or jaundice.  NEUROLOGY: A and O x 3. No focal deficit. Power 5/5. Sensation intact.   LABORATORY AND IMAGING DATA: ABG showed pH of 7.43, CO2 39, pO2 51, FiO2 21. Influenza A and  negative. Glucose 104, BUN 16, creatinine 0.77, sodium 133, potassium 3.9, chloride 101, bicarbonate 28. Troponin less than 0.02. WBC 13.2, hemoglobin 14, platelets 333. Chest x-ray shows right middle lobe consolidation consistent with pneumonia.   EKG shows normal sinus rhythm at 107 BPM.  IMPRESSIONS: 1.  Pneumonia, community acquired pneumonia (CAP).  2.  Acute respiratory failure.  3.  Systemic inflammatory response syndrome. 4.  Hyponatremia.  5.  Hypertension.  6.  Coronary artery disease.  7.  Hyperlipidemia.  8.  Tobacco abuse.   PLAN OF TREATMENT: 1.  The patient will be admitted to a medical floor. We will continue O2 by nasal cannula. Continue Levaquin, nebulizer, robitussin p.r.n.  2.  We will follow up CBC, blood culture and sputum culture.  3.  We will give normal saline IV. Follow up BMP.   4.  For hypertension we will continue Lopressor, aspirin, Plavix.  5.  Smoking cessation. Was counseled for 6 minutes.    The patient is a FULL CODE.   I discussed patient's condition and plan of treatment with the patient and the patient's sister.   TIME SPENT: About 52 minutes.   ____________________________ Demetrios Loll, MD qc:dp D: 03/19/2013 15:17:11 ET T: 03/19/2013 17:11:45 ET JOB#: [id number]  cc: Demetrios Loll, MD, <Dictator> Demetrios Loll MD ELECTRONICALLY SIGNED 03/22/2013 13:43

## 2014-09-04 NOTE — Discharge Summary (Signed)
PATIENT NAME:  Kathryn Garrett, Kathryn Garrett MR#:  [id number] DATE OF BIRTH:  03-22-1946  DATE OF ADMISSION:  03/19/2013 DATE OF DISCHARGE:  03/25/2013  ADMITTING PHYSICIAN:  Demetrios Loll, MD  DISCHARGING PHYSICIAN: Gladstone Lighter, MD  PRIMARY CARE PHYSICIAN: Nonlocal.    [place]: None.   DISCHARGE DIAGNOSES:  1.  Community-acquired pneumonia right lower lobe.  2.  Right upper lobe pulmonary nodule. 3.  Coronary artery disease.  4.  Chronic obstructive pulmonary disease exacerbation.  5.  Tobacco use disorder.   DISCHARGE HOME MEDICATIONS:  1.  Aspirin 320 mg p.o. daily.  2.  Plavix 75 mg p.o. daily.  3.  Benzonatate 100 mg capsule q.6h. p.r.n. for cough.  4.  Metoprolol 50 mg p.o. b.i.d.  5.  Advair 500/50 one puff b.i.d.  6.  Combivent Respimat 1 puff 4 times a day.  7.  Guaifenesin 600 mg 1 tablet p.o. b.i.d.   8.  Robitussin-AC 10 mL q.6 hours p.r.n. for cough. 9.  Levaquin 500 mg p.o. daily for 4 more days.  10.  Augmentin 875 mg 1 tablet p.o. b.i.d. for 6 more days.  11.  Prednisone taper.   DISCHARGE HOME OXYGEN:  None.  DISCHARGE DIET: Low-sodium diet.   DISCHARGE ACTIVITY: As tolerated.    FOLLOWUP INSTRUCTIONS: 1.  PCP followup in 2 weeks.  2.  CT chest followup in 3 months for right upper lobe pulmonary nodule.  4.  Smoking cessation.   LABORATORY AND RADIOLOGICAL DATA PRIOR TO DISCHARGE:   1.  WBC 17.7, hemoglobin 10.6, hematocrit 31.5, platelet count 398.  2.  Sodium 138, potassium 4.2, chloride 105, bicarbonate 30, BUN 19, creatinine 0.75, glucose 94 and calcium of 9.0.  3.  Sputum culture is growing E. coli.  4.  CT of the chest without contrast done showing 7 x 6 mm anterior right upper lobe pulmonary nodule and right lower lobe opacity/consolidation and associated small right pleural effusion.  5.  LDL cholesterol 42, HDL 21, total cholesterol 85, triglycerides 112.  6.  TSH within normal limits at 1.87.  7.  Urinalysis negative for any  infection.  8.  Blood cultures negative.  9.  Influenza antigen test is negative.  10.  Chest x-ray on admission showing clear lung fields other than right middle lobe and lower lobe consolidation consistent with pneumonia.   BRIEF HOSPITAL COURSE: Ms. Chahal is a 69 year old Caucasian female with past medical history significant coronary artery disease and ongoing smoking, presents to the hospital secondary to possible pneumonia.  1.  Community-acquired pneumonia. The patient initially required 2 liters of oxygen and since her cough is getting worse, not improving, she had a CT chest done which showed right lower lobe consolidation with associated small effusion, no empyema noted, and there were scattered possible infectious nodules in the lungs.  She was initially on Levaquin but was broadened coverage with vancomycin and Zosyn. Clinically, she has improved a lot except for the cough.  Her breathing has improved and she is off the oxygen. Antibiotics are being narrowed down to Levaquin and Augmentin to cover a total of 10-day course for her pneumonia. She will follow up with her PCP.  2.  Right upper lobe nodule. Incidentally noted to have a 6 mm right upper lobe lung nodule. Since she is a smoker, she will need strict followup to make sure it is not malignant. Three-month CT is recommended at this time.  3.  Coronary artery disease has been stable without any cardiac  symptoms. She is on aspirin and Plavix for the same.   4.  Tobacco use disorder. She was strongly counseled against smoking. Now, with the right upper lobe pulmonary nodule, the patient is very concerned and has decided to quit smoking.  5.  COPD exacerbation. Treated with steroids while in the hospital.  Her leukocytosis was partly from pneumonia and partly from her steroids. She is being discharged on a prednisone taper. She is also on Advair and Combivent Respimat.    Her course has been otherwise uneventful in the hospital.    DISCHARGE CONDITION: Stable.   DISCHARGE DISPOSITION: Home.   TIME SPENT ON DISCHARGE: 45 minutes.    ____________________________ Gladstone Lighter, MD rk:cs D: 03/25/2013 13:06:26 ET T: 03/25/2013 14:49:16 ET JOB#: [id number]  cc: Gladstone Lighter, MD, <Dictator> Gladstone Lighter MD ELECTRONICALLY SIGNED 04/08/2013 18:06

## 2015-06-08 DIAGNOSIS — J209 Acute bronchitis, unspecified: Secondary | ICD-10-CM | POA: Diagnosis not present

## 2015-09-13 DIAGNOSIS — J209 Acute bronchitis, unspecified: Secondary | ICD-10-CM | POA: Diagnosis not present

## 2015-09-24 ENCOUNTER — Inpatient Hospital Stay
Admission: EM | Admit: 2015-09-24 | Discharge: 2015-10-01 | DRG: 853 | Disposition: A | Discharge status: Home / Self Care | Payer: Medicare HMO | Attending: Internal Medicine | Admitting: Internal Medicine

## 2015-09-24 ENCOUNTER — Emergency Department: Payer: Medicare HMO

## 2015-09-24 ENCOUNTER — Encounter: Payer: Self-pay | Admitting: Emergency Medicine

## 2015-09-24 DIAGNOSIS — R0781 Pleurodynia: Secondary | ICD-10-CM | POA: Diagnosis not present

## 2015-09-24 DIAGNOSIS — M1990 Unspecified osteoarthritis, unspecified site: Secondary | ICD-10-CM | POA: Diagnosis present

## 2015-09-24 DIAGNOSIS — I509 Heart failure, unspecified: Secondary | ICD-10-CM | POA: Diagnosis present

## 2015-09-24 DIAGNOSIS — J159 Unspecified bacterial pneumonia: Secondary | ICD-10-CM | POA: Diagnosis not present

## 2015-09-24 DIAGNOSIS — R0602 Shortness of breath: Secondary | ICD-10-CM

## 2015-09-24 DIAGNOSIS — I10 Essential (primary) hypertension: Secondary | ICD-10-CM | POA: Diagnosis present

## 2015-09-24 DIAGNOSIS — A419 Sepsis, unspecified organism: Principal | ICD-10-CM | POA: Diagnosis present

## 2015-09-24 DIAGNOSIS — Z7901 Long term (current) use of anticoagulants: Secondary | ICD-10-CM | POA: Diagnosis not present

## 2015-09-24 DIAGNOSIS — J90 Pleural effusion, not elsewhere classified: Secondary | ICD-10-CM | POA: Diagnosis not present

## 2015-09-24 DIAGNOSIS — Z79899 Other long term (current) drug therapy: Secondary | ICD-10-CM

## 2015-09-24 DIAGNOSIS — R091 Pleurisy: Secondary | ICD-10-CM | POA: Diagnosis not present

## 2015-09-24 DIAGNOSIS — J869 Pyothorax without fistula: Secondary | ICD-10-CM | POA: Diagnosis not present

## 2015-09-24 DIAGNOSIS — I110 Hypertensive heart disease with heart failure: Secondary | ICD-10-CM | POA: Diagnosis not present

## 2015-09-24 DIAGNOSIS — R52 Pain, unspecified: Secondary | ICD-10-CM | POA: Diagnosis not present

## 2015-09-24 DIAGNOSIS — I959 Hypotension, unspecified: Secondary | ICD-10-CM | POA: Diagnosis not present

## 2015-09-24 DIAGNOSIS — E872 Acidosis: Secondary | ICD-10-CM | POA: Diagnosis present

## 2015-09-24 DIAGNOSIS — R079 Chest pain, unspecified: Secondary | ICD-10-CM | POA: Diagnosis not present

## 2015-09-24 DIAGNOSIS — R509 Fever, unspecified: Secondary | ICD-10-CM

## 2015-09-24 DIAGNOSIS — R1012 Left upper quadrant pain: Secondary | ICD-10-CM | POA: Diagnosis not present

## 2015-09-24 DIAGNOSIS — F17200 Nicotine dependence, unspecified, uncomplicated: Secondary | ICD-10-CM | POA: Diagnosis not present

## 2015-09-24 DIAGNOSIS — I252 Old myocardial infarction: Secondary | ICD-10-CM | POA: Diagnosis not present

## 2015-09-24 DIAGNOSIS — R000 Tachycardia, unspecified: Secondary | ICD-10-CM | POA: Diagnosis present

## 2015-09-24 DIAGNOSIS — D72829 Elevated white blood cell count, unspecified: Secondary | ICD-10-CM | POA: Diagnosis not present

## 2015-09-24 DIAGNOSIS — R69 Illness, unspecified: Secondary | ICD-10-CM | POA: Diagnosis not present

## 2015-09-24 DIAGNOSIS — J189 Pneumonia, unspecified organism: Secondary | ICD-10-CM | POA: Diagnosis present

## 2015-09-24 DIAGNOSIS — Z8249 Family history of ischemic heart disease and other diseases of the circulatory system: Secondary | ICD-10-CM

## 2015-09-24 DIAGNOSIS — J948 Other specified pleural conditions: Secondary | ICD-10-CM | POA: Diagnosis not present

## 2015-09-24 DIAGNOSIS — Z881 Allergy status to other antibiotic agents status: Secondary | ICD-10-CM

## 2015-09-24 DIAGNOSIS — R05 Cough: Secondary | ICD-10-CM | POA: Diagnosis not present

## 2015-09-24 DIAGNOSIS — Z9889 Other specified postprocedural states: Secondary | ICD-10-CM

## 2015-09-24 DIAGNOSIS — Z09 Encounter for follow-up examination after completed treatment for conditions other than malignant neoplasm: Secondary | ICD-10-CM | POA: Diagnosis not present

## 2015-09-24 DIAGNOSIS — I2510 Atherosclerotic heart disease of native coronary artery without angina pectoris: Secondary | ICD-10-CM | POA: Diagnosis not present

## 2015-09-24 DIAGNOSIS — Z801 Family history of malignant neoplasm of trachea, bronchus and lung: Secondary | ICD-10-CM

## 2015-09-24 DIAGNOSIS — I739 Peripheral vascular disease, unspecified: Secondary | ICD-10-CM | POA: Diagnosis not present

## 2015-09-24 DIAGNOSIS — J910 Malignant pleural effusion: Secondary | ICD-10-CM | POA: Diagnosis not present

## 2015-09-24 DIAGNOSIS — R0600 Dyspnea, unspecified: Secondary | ICD-10-CM | POA: Diagnosis not present

## 2015-09-24 HISTORY — DX: Pleural effusion, not elsewhere classified: J90

## 2015-09-24 HISTORY — DX: Atherosclerotic heart disease of native coronary artery without angina pectoris: I25.10

## 2015-09-24 HISTORY — DX: Unspecified osteoarthritis, unspecified site: M19.90

## 2015-09-24 HISTORY — DX: Essential (primary) hypertension: I10

## 2015-09-24 HISTORY — DX: Acute myocardial infarction, unspecified: I21.9

## 2015-09-24 LAB — BRAIN NATRIURETIC PEPTIDE: B NATRIURETIC PEPTIDE 5: 30 pg/mL (ref 0.0–100.0)

## 2015-09-24 LAB — COMPREHENSIVE METABOLIC PANEL
ALK PHOS: 73 U/L (ref 38–126)
ALT: 12 U/L — AB (ref 14–54)
AST: 19 U/L (ref 15–41)
Albumin: 3.8 g/dL (ref 3.5–5.0)
Anion gap: 11 (ref 5–15)
BILIRUBIN TOTAL: 0.3 mg/dL (ref 0.3–1.2)
BUN: 20 mg/dL (ref 6–20)
CALCIUM: 9.1 mg/dL (ref 8.9–10.3)
CO2: 20 mmol/L — ABNORMAL LOW (ref 22–32)
CREATININE: 0.65 mg/dL (ref 0.44–1.00)
Chloride: 109 mmol/L (ref 101–111)
Glucose, Bld: 92 mg/dL (ref 65–99)
Potassium: 4.3 mmol/L (ref 3.5–5.1)
Sodium: 140 mmol/L (ref 135–145)
Total Protein: 7.5 g/dL (ref 6.5–8.1)

## 2015-09-24 LAB — CBC
HEMATOCRIT: 41.8 % (ref 35.0–47.0)
HEMOGLOBIN: 13.7 g/dL (ref 12.0–16.0)
MCH: 31.3 pg (ref 26.0–34.0)
MCHC: 32.8 g/dL (ref 32.0–36.0)
MCV: 95.4 fL (ref 80.0–100.0)
Platelets: 356 10*3/uL (ref 150–440)
RBC: 4.38 MIL/uL (ref 3.80–5.20)
RDW: 13.4 % (ref 11.5–14.5)
WBC: 14 10*3/uL — AB (ref 3.6–11.0)

## 2015-09-24 LAB — TROPONIN I

## 2015-09-24 LAB — LACTIC ACID, PLASMA: Lactic Acid, Venous: 1.5 mmol/L (ref 0.5–2.0)

## 2015-09-24 IMAGING — CT CT ANGIO CHEST
2 of 6 series · 18 of 46 positions shown · IV contrast (APPLIED)
Comparison: [DATE]

CLINICAL DATA: Reports left side pain with deep breathing. Seen at
[REDACTED] and dx with plural effusion. No resp distress

EXAM:
CT ANGIOGRAPHY CHEST WITH CONTRAST
TECHNIQUE: Multidetector CT imaging of the chest was performed using the
standard protocol during bolus administration of intravenous
contrast. Multiplanar CT image reconstructions and MIPs were
obtained to evaluate the vascular anatomy.
CONTRAST:  100 cc Isovue 370

[Series 5: pe thins 1.5 · axial · 0.68mm/px · z∈[-312,-53]mm · 15 of 242 slices shown]
[im 13/242  lung]
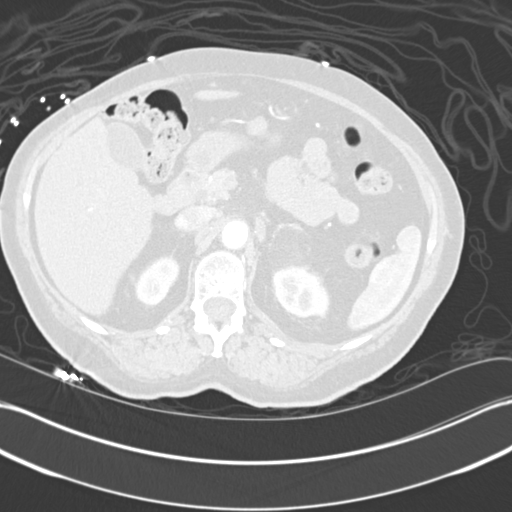
[im 26/242  soft-tissue]
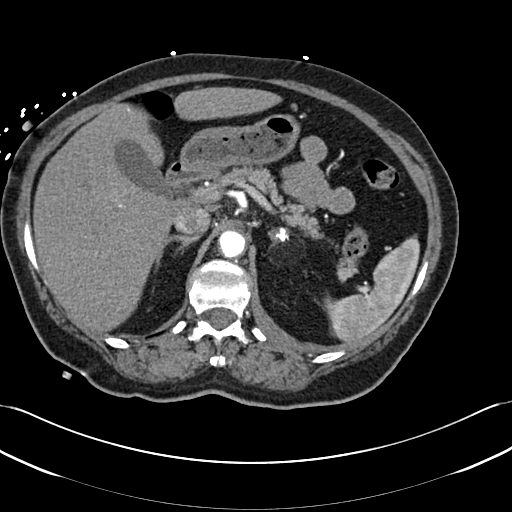
[im 51/242  lung]
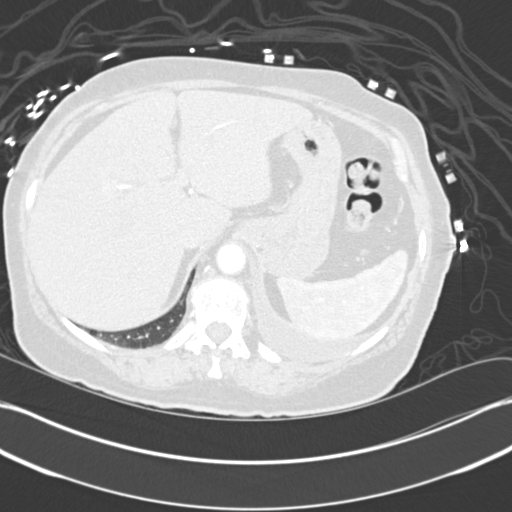
[im 64/242  soft-tissue]
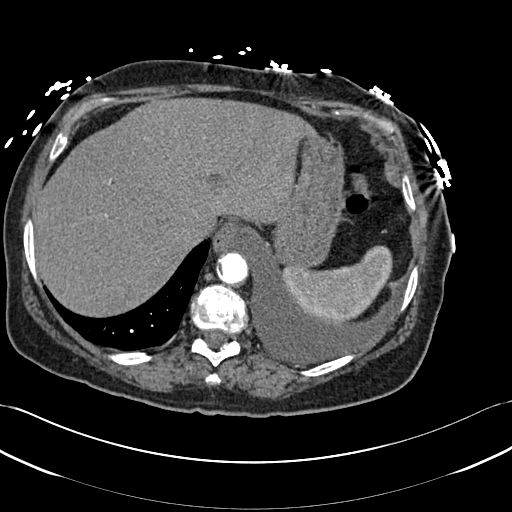
[im 77/242  lung]
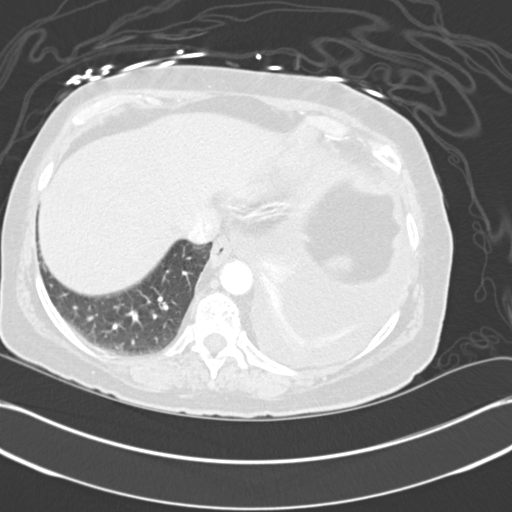
[im 89/242  soft-tissue]
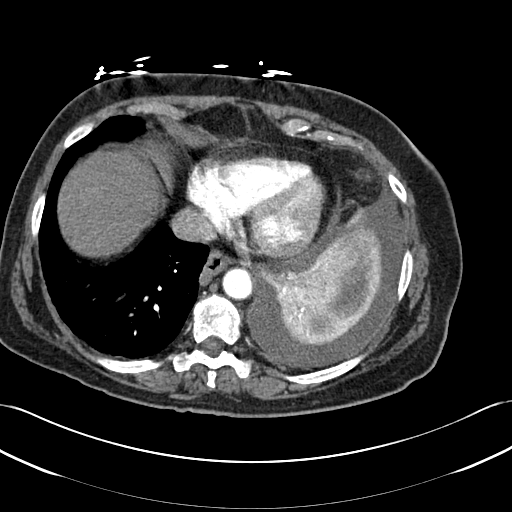
[im 102/242  lung]
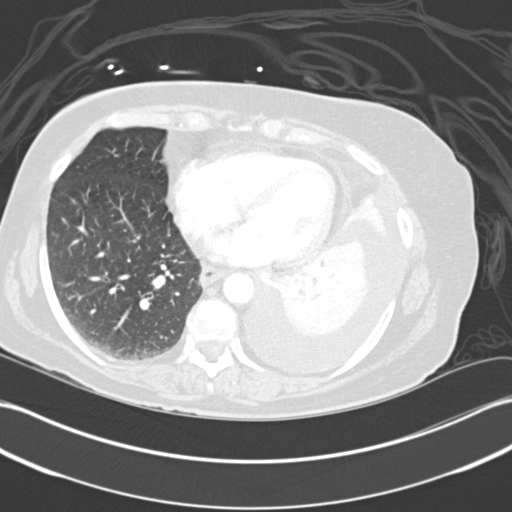
[im 127/242  soft-tissue]
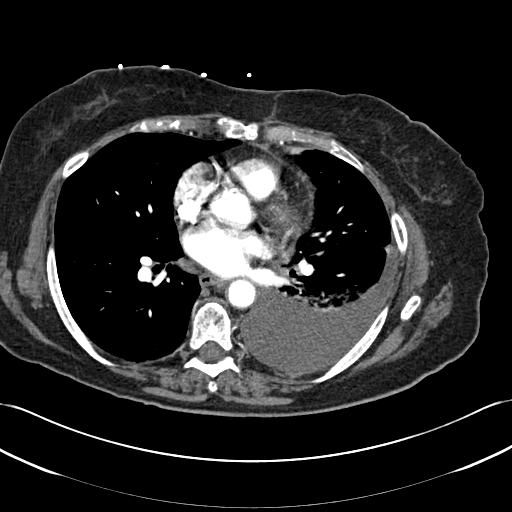
[im 140/242  lung]
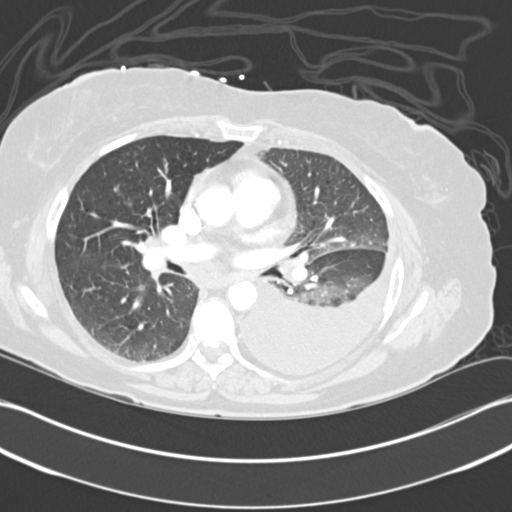
[im 153/242  soft-tissue]
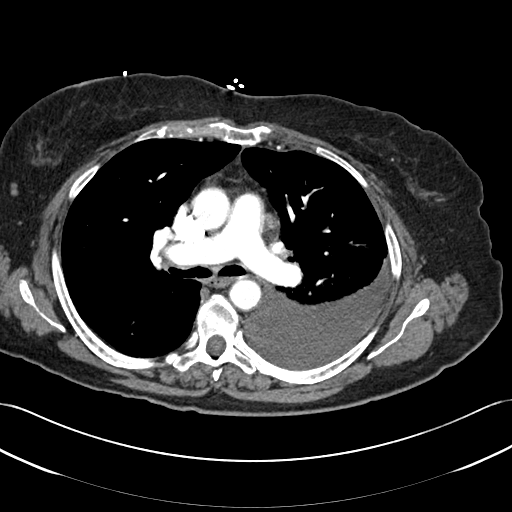
[im 165/242  lung]
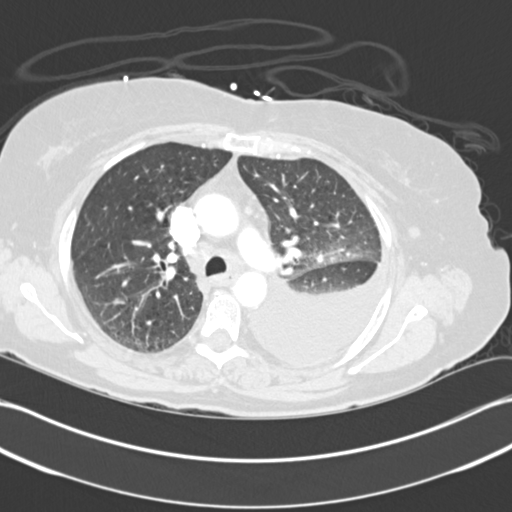
[im 178/242  soft-tissue]
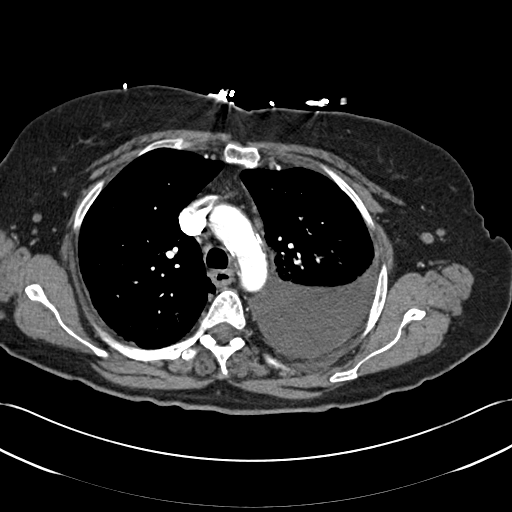
[im 203/242  lung]
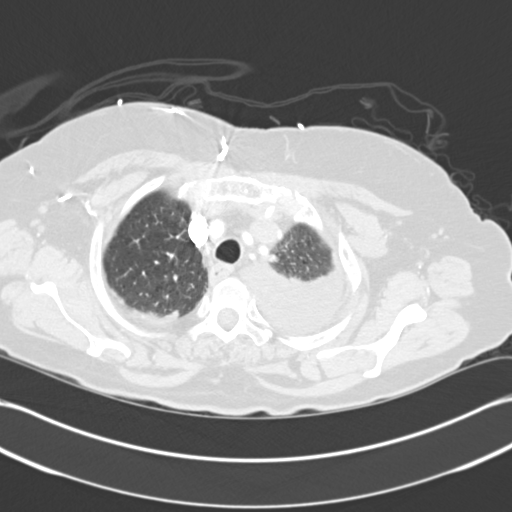
[im 216/242  soft-tissue]
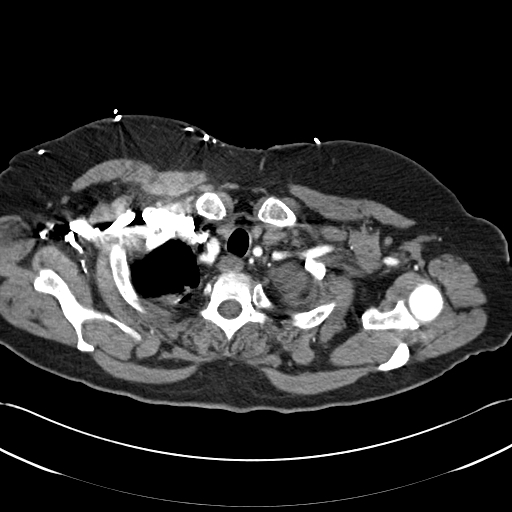
[im 229/242  lung]
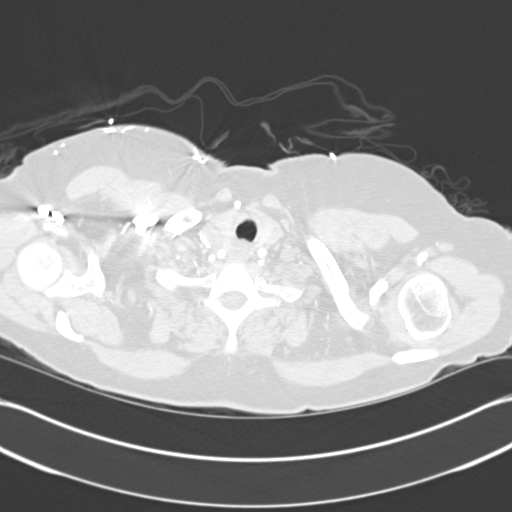

[Series 7: cor mpr 2.0 · coronal · 0.61mm/px · 3 of 117 slices shown]
[im 30/117  soft-tissue]
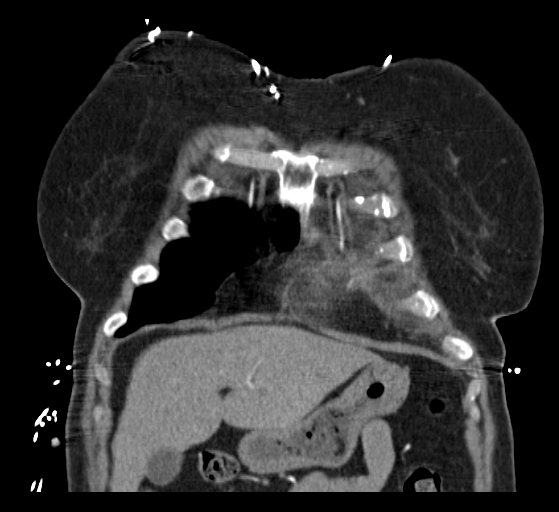
[im 59/117  soft-tissue]
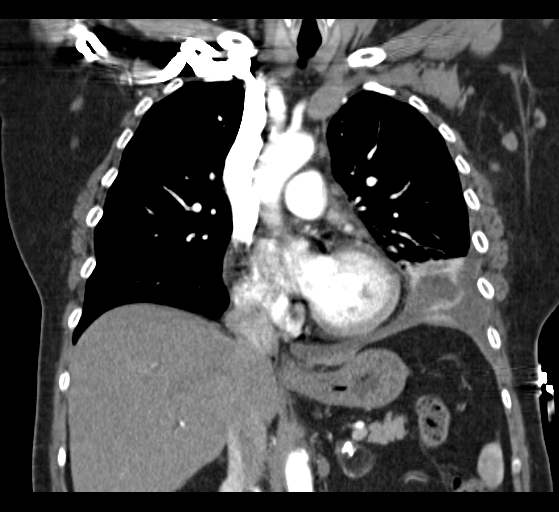
[im 88/117  soft-tissue]
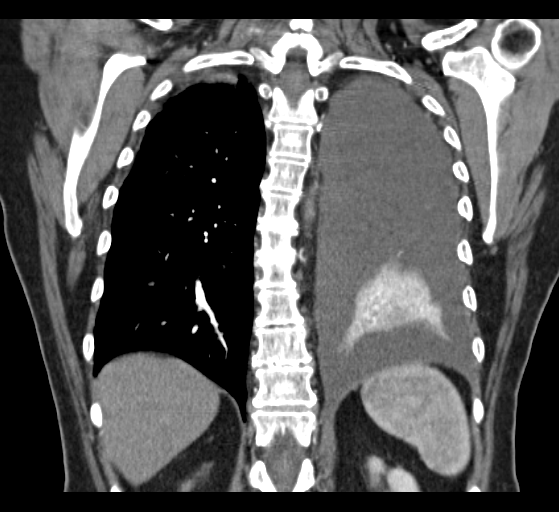

[18 of 46 positions shown; findings below may reference images not displayed]

FINDINGS: Heart:  Heart size is normal.  There is trace pericardial effusion.

Vascular structures: There is atherosclerotic calcification of the
thoracic aorta. No aneurysm. Note is made of a discrete origin for
the left vertebral artery from the aortic arch.

The pulmonary arteries are well opacified by contrast bolus. No
acute pulmonary embolus.

Mediastinum/thyroid: The visualized portion of the thyroid gland has
a normal appearance. Esophagus has a normal appearance.

There are small mediastinal lymph nodes, measuring 1.0 cm in the
prevascular region, 1.0 cm in the pre carinal region, 1.3 cm in the
left infrahilar region.

Lungs/Airways: There is a moderate left pleural effusion. In the
right upper lobe there is a 7 x 6 mm nodule (7 mm mean diameter) on
image 35 of series 6. Appearance is unchanged since previous chest
CT from [1C]. Mild emphysematous changes are present. There is
smooth septal thickening of the lungs bilaterally. Left lower lobe
consolidation is present.

Upper abdomen: Gallbladder is present. Benign left adrenal lesion is
fatty attenuation consistent with myelolipoma measuring 3.3 cm.

Chest wall/osseous structures: Degenerative changes are seen in the
spine. No suspicious lytic or blastic lesions are identified.

Review of the MIP images confirms the above findings.
IMPRESSION: 1. Moderate left pleural effusion and left lower lobe consolidation,
favored to be benign. However, follow-up is recommended to document
clearing.
2. Stable appearance of right upper lobe 7 mm nodule. Given the
long-term stability, this process is benign and no specific
follow-up is necessary.
3. Small mediastinal lymph nodes may be reactive.
4. Trace pericardial effusion.

## 2015-09-24 IMAGING — CR DG CHEST 2V
1 series · 2 of 2 positions shown · non-contrast
Comparison: [DATE]

CLINICAL DATA: Left side pain with deep breathing starting
[REDACTED]

EXAM:
CHEST  2 VIEW

[Series 1: dg chest 2 view · 0.14mm/px · 2 of 2 slices shown]
[im 1/2]
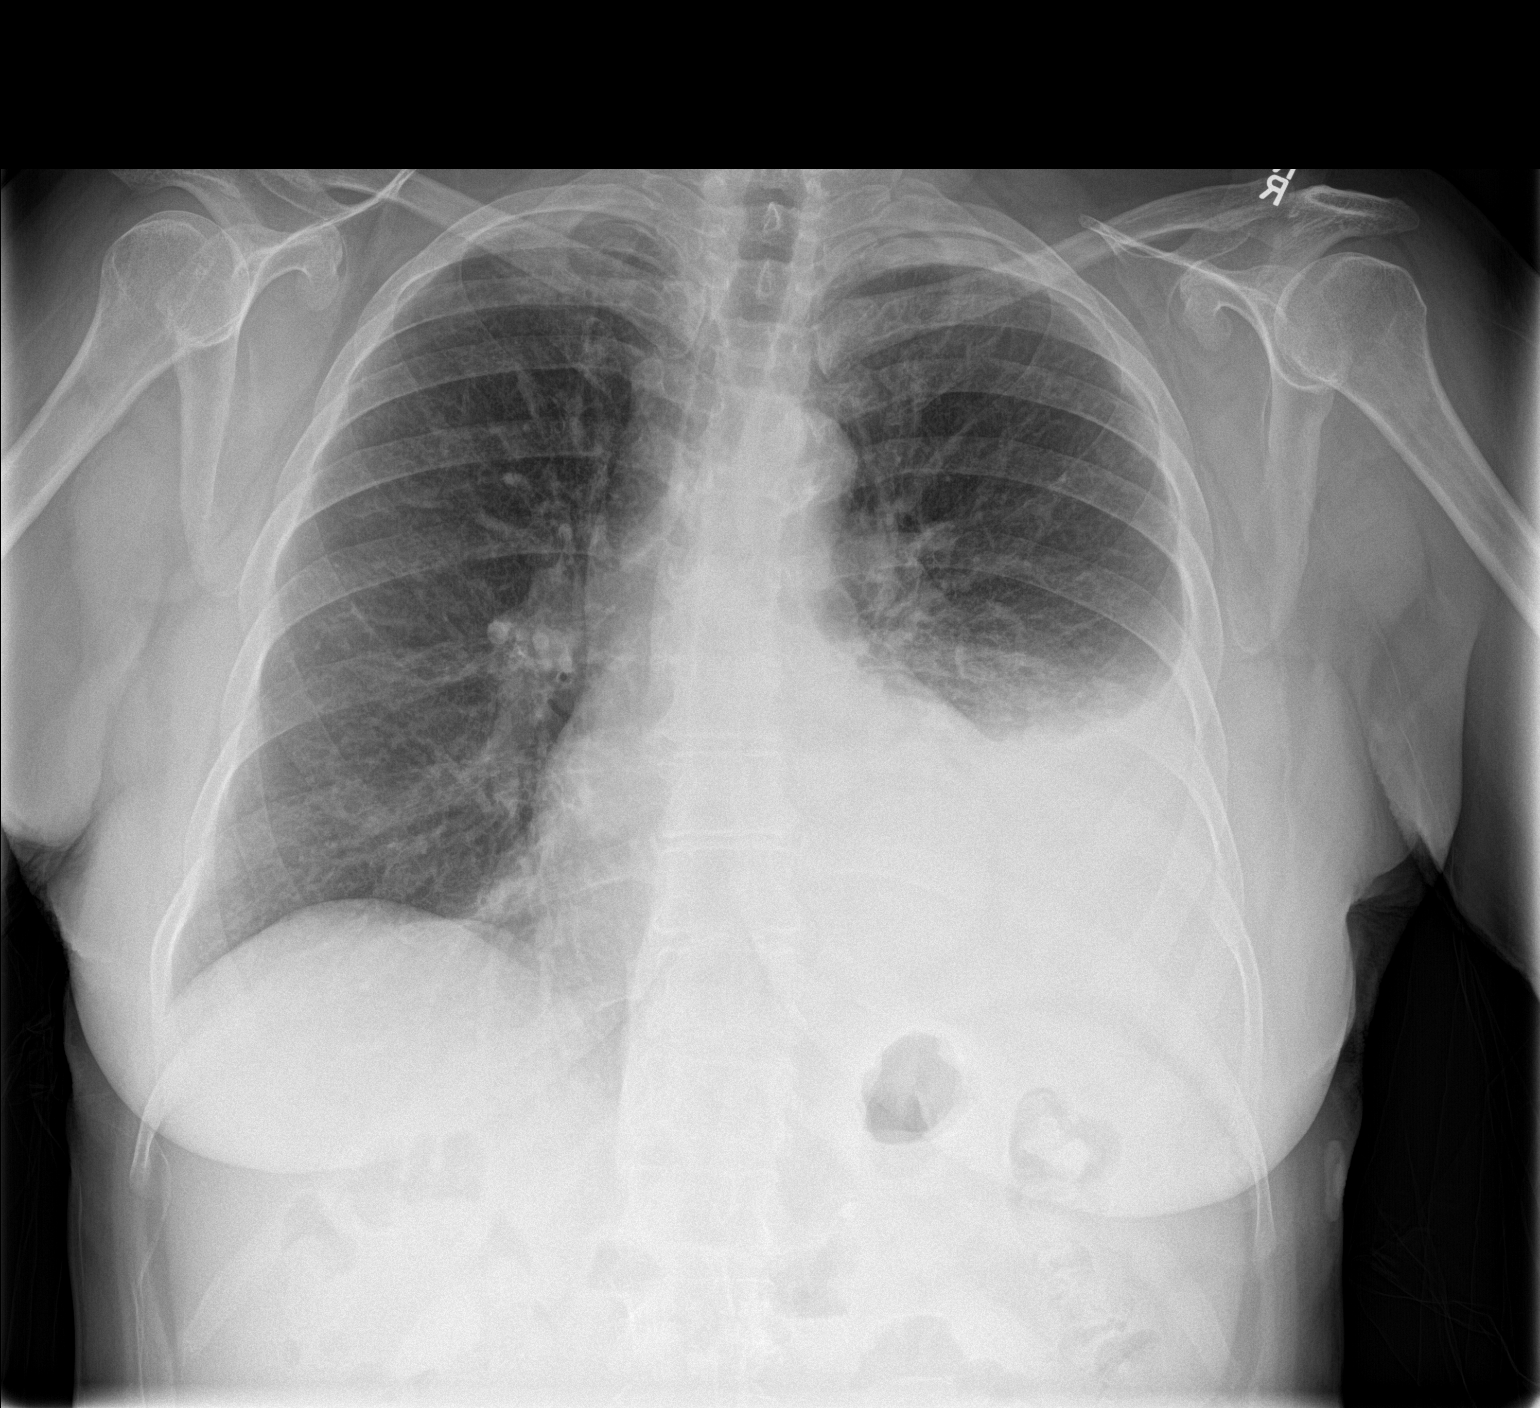
[im 2/2]
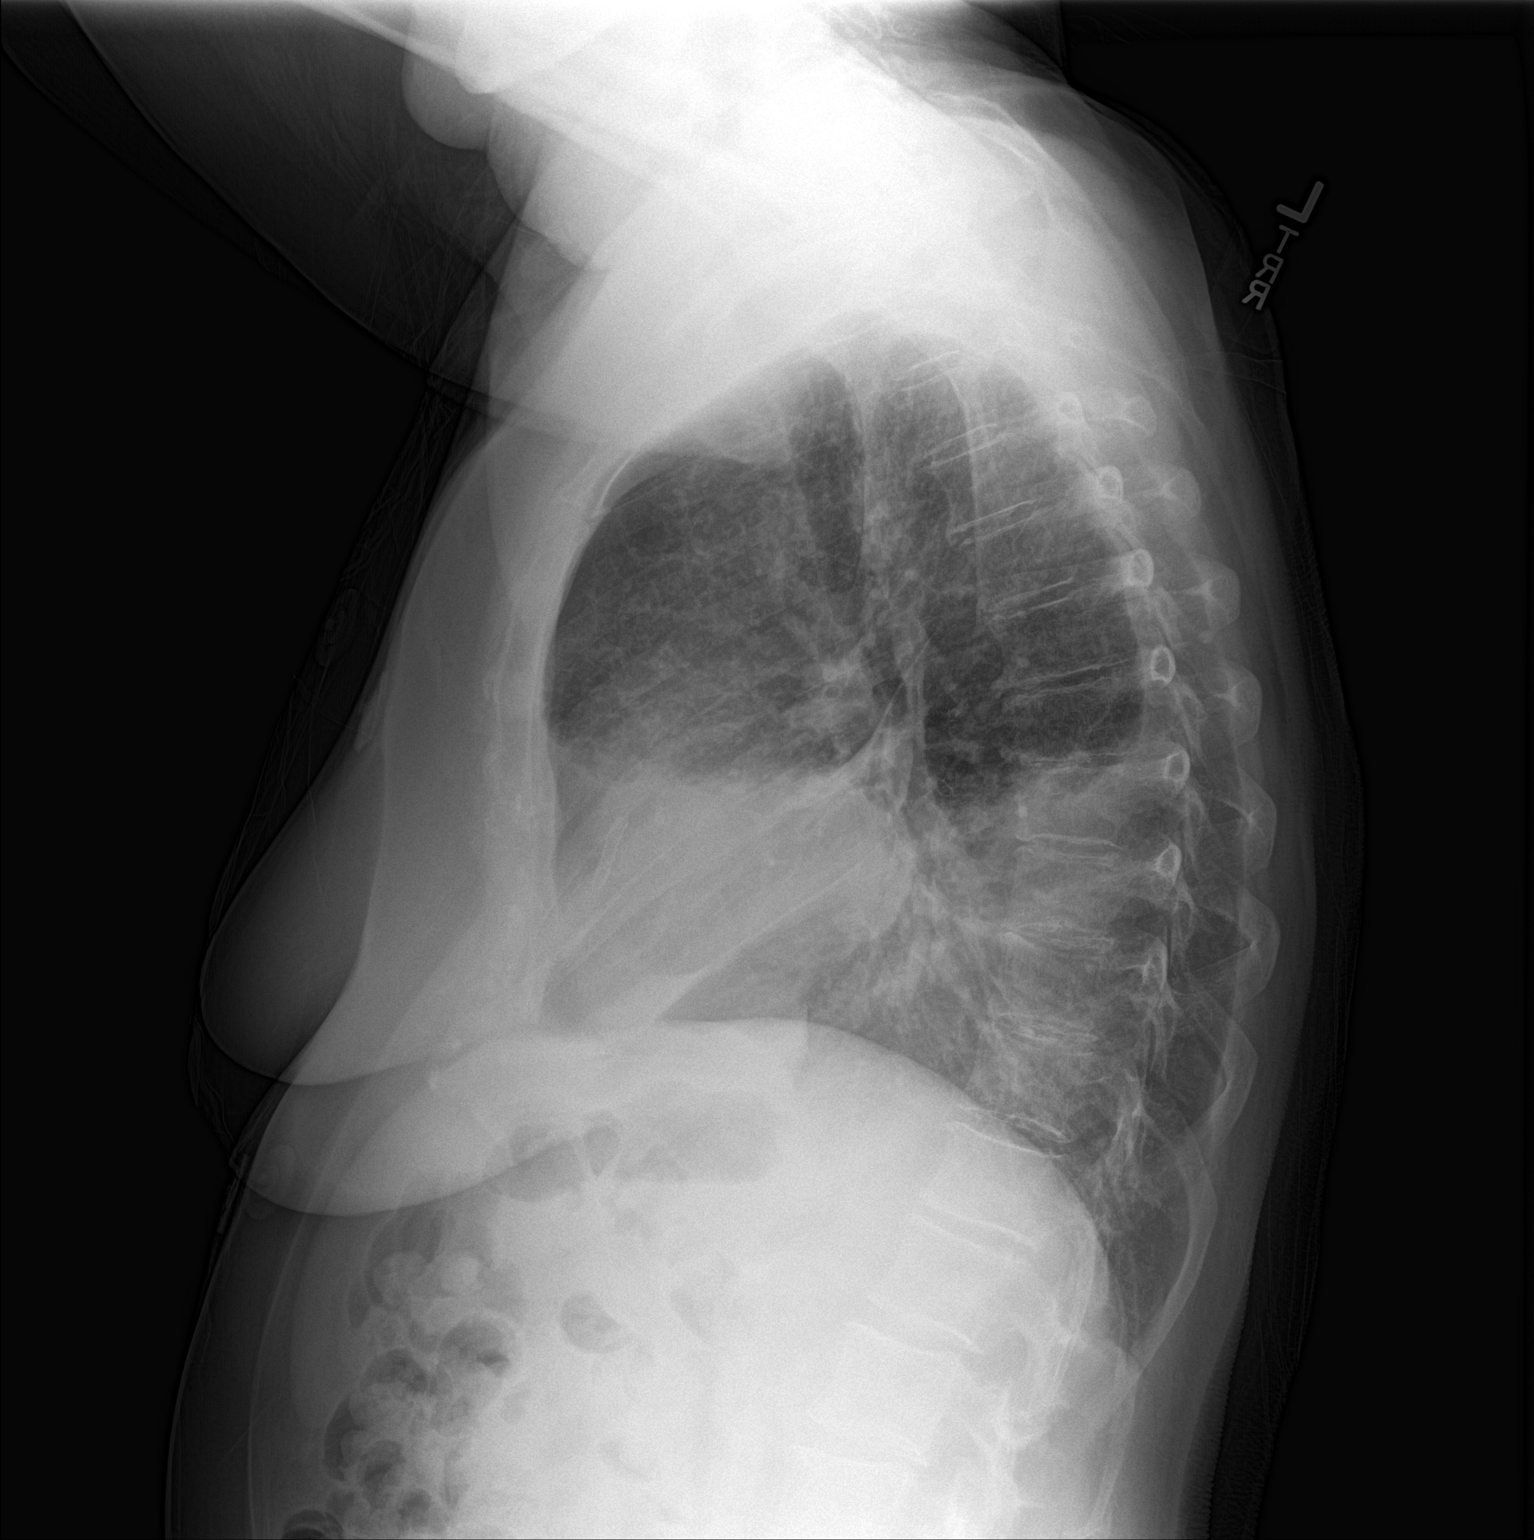

[2 of 2 positions shown; findings below may reference images not displayed]

FINDINGS: Cardiomediastinal silhouette is stable. There is moderate size left
pleural effusion with left lower lobe atelectasis or infiltrate.
Right lung is clear. No pulmonary edema.
IMPRESSION: Moderate left pleural effusion with left lower lobe atelectasis or
infiltrate. No pulmonary edema.

## 2015-09-24 MED ORDER — ONDANSETRON HCL 4 MG/2ML IJ SOLN: 4.0000 mg | Freq: Four times a day (QID) | INTRAMUSCULAR | Status: DC | PRN

## 2015-09-24 MED ORDER — SODIUM CHLORIDE 0.9 % IV SOLN
INTRAVENOUS | Status: AC
  Administered 2015-09-24: 23:00:00 via INTRAVENOUS

## 2015-09-24 MED ORDER — ONDANSETRON HCL 4 MG PO TABS: 4.0000 mg | ORAL_TABLET | Freq: Four times a day (QID) | ORAL | Status: DC | PRN

## 2015-09-24 MED ORDER — IOPAMIDOL (ISOVUE-370) INJECTION 76%
100.0000 mL | Freq: Once | INTRAVENOUS | Status: AC | PRN
Start: 2015-09-24 — End: 2015-09-24
  Administered 2015-09-24: 100 mL via INTRAVENOUS

## 2015-09-24 MED ORDER — ACETAMINOPHEN 650 MG RE SUPP: 650.0000 mg | Freq: Four times a day (QID) | RECTAL | Status: DC | PRN

## 2015-09-24 MED ORDER — ACETAMINOPHEN 325 MG PO TABS
650.0000 mg | ORAL_TABLET | Freq: Four times a day (QID) | ORAL | Status: DC | PRN
  Administered 2015-09-27 – 2015-10-01 (×4): 650 mg via ORAL
  Filled 2015-09-24 (×4): qty 2

## 2015-09-24 MED ORDER — METOPROLOL TARTRATE 25 MG PO TABS
25.0000 mg | ORAL_TABLET | Freq: Two times a day (BID) | ORAL | Status: DC
  Administered 2015-09-25 – 2015-09-26 (×4): 25 mg via ORAL
  Filled 2015-09-24 (×5): qty 1

## 2015-09-24 MED ORDER — OXYCODONE HCL 5 MG PO TABS
5.0000 mg | ORAL_TABLET | ORAL | Status: DC | PRN
  Administered 2015-09-24 – 2015-10-01 (×17): 5 mg via ORAL
  Filled 2015-09-24 (×18): qty 1

## 2015-09-24 MED ORDER — VANCOMYCIN HCL IN DEXTROSE 1-5 GM/200ML-% IV SOLN
1000.0000 mg | Freq: Once | INTRAVENOUS | Status: AC
  Administered 2015-09-25: 1000 mg via INTRAVENOUS
  Filled 2015-09-24: qty 200

## 2015-09-24 MED ORDER — CLOPIDOGREL BISULFATE 75 MG PO TABS
75.0000 mg | ORAL_TABLET | Freq: Every day | ORAL | Status: DC
  Administered 2015-09-25 – 2015-09-27 (×3): 75 mg via ORAL
  Filled 2015-09-24 (×3): qty 1

## 2015-09-24 MED ORDER — VANCOMYCIN HCL IN DEXTROSE 750-5 MG/150ML-% IV SOLN
750.0000 mg | INTRAVENOUS | Status: DC
  Administered 2015-09-25 – 2015-09-27 (×3): 750 mg via INTRAVENOUS
  Filled 2015-09-24 (×4): qty 150

## 2015-09-24 MED ORDER — SODIUM CHLORIDE 0.9% FLUSH
3.0000 mL | Freq: Two times a day (BID) | INTRAVENOUS | Status: DC
  Administered 2015-09-25 – 2015-09-30 (×8): 3 mL via INTRAVENOUS

## 2015-09-24 MED ORDER — LEVOFLOXACIN IN D5W 750 MG/150ML IV SOLN
750.0000 mg | Freq: Once | INTRAVENOUS | Status: AC
  Administered 2015-09-24: 750 mg via INTRAVENOUS
  Filled 2015-09-24: qty 150

## 2015-09-24 MED ORDER — PIPERACILLIN-TAZOBACTAM 3.375 G IVPB
3.3750 g | Freq: Three times a day (TID) | INTRAVENOUS | Status: DC
  Administered 2015-09-25 – 2015-09-27 (×7): 3.375 g via INTRAVENOUS
  Filled 2015-09-24 (×9): qty 50

## 2015-09-24 MED ORDER — ENOXAPARIN SODIUM 40 MG/0.4ML ~~LOC~~ SOLN
40.0000 mg | SUBCUTANEOUS | Status: DC
  Administered 2015-09-25 – 2015-09-26 (×2): 40 mg via SUBCUTANEOUS
  Filled 2015-09-24 (×2): qty 0.4

## 2015-09-24 NOTE — ED Notes (Signed)
This RN checking on pt pt reports pain to IV site where antibiotic infusing, Removed IV site dry minimal swelling noted, Dr. Corky Downs made aware  Removed IV and placed ice pack on left hand . Instructed pt if she feels pain or discomfort while fluid being infused in vein to immediately report to nurse pt verbalizes understanding  Antibiotic infusion changed to right [facility]

## 2015-09-24 NOTE — ED Notes (Signed)
Reports left side pain with deep breathing.  Seen at [facility] and dx with plural effusion.  No resp distress

## 2015-09-24 NOTE — Progress Notes (Signed)
Pharmacy Antibiotic Note  Kathryn Garrett is a 70 y.o. female admitted on 09/24/2015 with pneumonia.  Pharmacy has been consulted for Zosyn and vancomycin dosing.  Plan: 1. Zosyn 3.375 gm IV Q8H EI 2. Vancomycin 1 gm IV x 1 in ED followed by vancomycin 750 mg IV Q18H, predicted trough 20 mcg/mL. Pharmacy will continue to follow and adjust as needed to maintain trough 15 to 20 mcg/mL.  Vd 31.5 L, Ke 0.043 hr-1, T1/2 16.1 hr  Height: 4\' 11"  (149.9 cm) Weight: 136 lb (61.689 kg) IBW/kg (Calculated) : 43.2  Temp (24hrs), Avg:99.3 F (37.4 C), Min:99.1 F (37.3 C), Max:99.4 F (37.4 C)   Recent Labs Lab 09/24/15 1807 09/24/15 2209  WBC 14.0*  --   CREATININE 0.65  --   LATICACIDVEN  --  1.5    Estimated Creatinine Clearance: 52.3 mL/min (by C-G formula based on Cr of 0.65).    Allergies  Allergen Reactions  . Azithromycin Rash   Thank you for allowing pharmacy to be a part of this patient's care.  Laural Benes, Pharm.D., BCPS Clinical Pharmacist 09/24/2015 10:54 PM

## 2015-09-24 NOTE — ED Provider Notes (Signed)
[facility] Emergency Department Provider Note  ____________________________________________    I have reviewed the triage vital signs and the nursing notes.   HISTORY  Chief Complaint Pleurisy    HPI Kathryn Garrett is a 70 y.o. female who presents with complaints of left-sided chest pain with deep inspiration. Patient was seen by her physician and she was found to have a pleural effusion. She complains of mild shortness of breath. She has had low-grade temperatures at home. No nausea or vomiting or diaphoresis. No recent travel. No history of blood clots.     Past Medical History  Diagnosis Date  . MI (myocardial infarction) ([place])   . Arthritis     There are no active problems to display for this patient.   Past Surgical History  Procedure Laterality Date  . Abdominal hysterectomy      Current Outpatient Rx  Name  Route  Sig  Dispense  Refill  . amoxicillin (AMOXIL) 875 MG tablet   Oral   Take 875 mg by mouth 2 (two) times daily. for 10 days      0   . clopidogrel (PLAVIX) 75 MG tablet   Oral   Take 1 tablet by mouth daily.         . metoprolol tartrate (LOPRESSOR) 25 MG tablet   Oral   Take 1 tablet by mouth 2 (two) times daily.         . SUBOXONE 8-2 MG FILM   Sublingual   Place 1 Film under the tongue 3 (three) times daily.      0     Dispense as written.     Allergies Azithromycin  No family history on file.  Social History Social History  Substance Use Topics  . Smoking status: Never Smoker   . Smokeless tobacco: None  . Alcohol Use: None    Review of Systems  Constitutional: Negative for fever. Eyes: Negative for redness ENT: Negative for sore throat Cardiovascular: As above Respiratory: As above Gastrointestinal: Negative for abdominal pain Genitourinary: Negative for dysuria. Musculoskeletal: Negative for back pain. Skin: Negative for rash. Neurological: Negative for focal weakness Psychiatric:  no anxiety    ____________________________________________   PHYSICAL EXAM:  VITAL SIGNS: ED Triage Vitals  Enc Vitals Group     BP 09/24/15 1659 161/73 mmHg     Pulse Rate 09/24/15 1659 110     Resp 09/24/15 1659 20     Temp 09/24/15 1659 99.1 F (37.3 C)     Temp Source 09/24/15 1659 Oral     SpO2 09/24/15 1659 96 %     Weight 09/24/15 1659 136 lb (61.689 kg)     Height 09/24/15 1659 4\' 11"  (1.499 m)     Head Cir --      Peak Flow --      Pain Score 09/24/15 1655 5     Pain Loc --      Pain Edu? --      Excl. in [place]? --      Constitutional: Alert and oriented. Well appearing and in no distress.  Eyes: Conjunctivae are normal. No erythema or injection ENT   Head: Normocephalic and atraumatic.   Mouth/Throat: Mucous membranes are moist. Cardiovascular: Tachycardia, regular rhythm. Normal and symmetric distal pulses are present in the upper extremities. Respiratory: Normal respiratory effort without tachypnea nor retractions. Breath sounds are clear and equal bilaterally.  Gastrointestinal: Soft and non-tender in all quadrants. No distention. There is no CVA tenderness. Genitourinary: deferred  Musculoskeletal: Nontender with normal range of motion in all extremities. No lower extremity tenderness nor edema. Neurologic:  Normal speech and language. No gross focal neurologic deficits are appreciated. Skin:  Skin is warm, dry and intact. No rash noted. Psychiatric: Mood and affect are normal. Patient exhibits appropriate insight and judgment.  ____________________________________________    LABS (pertinent positives/negatives)  Labs Reviewed  CBC - Abnormal; Notable for the following:    WBC 14.0 (*)    All other components within normal limits  COMPREHENSIVE METABOLIC PANEL - Abnormal; Notable for the following:    CO2 20 (*)    ALT 12 (*)    All other components within normal limits  TROPONIN I  BRAIN NATRIURETIC PEPTIDE     ____________________________________________   EKG  ED ECG REPORT I, Lavonia Drafts, the attending physician, personally viewed and interpreted this ECG.  Date: 09/24/2015 EKG Time: 6:17 PM Rate: 103 Rhythm: normal sinus rhythm QRS Axis: normal Intervals: normal ST/T Wave abnormalities: normal Conduction Disturbances: none Narrative Interpretation: unremarkable   ____________________________________________    RADIOLOGY  Left-sided pleural effusion, possible infiltrate CT angiography shows likely consolidation secondary to pneumonia ____________________________________________   PROCEDURES  Procedure(s) performed: none  Critical Care performed: none  ____________________________________________   INITIAL IMPRESSION / ASSESSMENT AND PLAN / ED COURSE  Pertinent labs & imaging results that were available during my care of the patient were reviewed by me and considered in my medical decision making (see chart for details).  Patient with pleurisy, left-sided and a pleural effusion, elevated white blood cell count, we will sent for CT scan of the chest to further evaluate,  ----------------------------------------- 8:45 PM on 09/24/2015 -----------------------------------------  CT scan most consistent with pneumonia. We will draw blood cultures, given IV Levaquin and admitted to the hospital given continue tachycardia elevated white blood cell count.  ____________________________________________   FINAL CLINICAL IMPRESSION(S) / ED DIAGNOSES  Final diagnoses:  Community acquired pneumonia          Lavonia Drafts, MD 09/24/15 2313

## 2015-09-24 NOTE — H&P (Signed)
[place] [facility] [place] NAME: Kathryn Garrett    MR#:  [id number]  DATE OF BIRTH:  1945/11/15  DATE OF ADMISSION:  09/24/2015  PRIMARY CARE PHYSICIAN: No primary care provider on file.   REQUESTING/REFERRING PHYSICIAN: Corky Downs, MD  CHIEF COMPLAINT:   Chief Complaint  Patient presents with  . Pleurisy    HISTORY OF PRESENT ILLNESS:  Kathryn Garrett  is a 70 y.o. female who presents with Shortness of breath and left-sided pleurisy. Patient states that she was seen in outpatient clinic a couple weeks ago and diagnosed with bronchitis. She was given azithromycin at that time. She subsequently developed a rash and her antibiotic was changed to amoxicillin. However, over the last several days she's been feeling significantly worse. She denies any measured fevers, but states that she feels "cold" all the time. She's had a mild cough, nonproductive. She went back to the clinic for reevaluation today and was found on chest x-ray to have left-sided pleural effusion, and was sent to the ED for further evaluation. In the ED she was found to be tachycardic and to have a leukocytosis on laboratory evaluation, and CT scan confirming pleural effusion and favoring infectious etiology. Hospitals were called for admission.  PAST MEDICAL HISTORY:   Past Medical History  Diagnosis Date  . MI (myocardial infarction) ([place])   . Arthritis   . CHF (congestive heart failure) ([place])   . Hypertension   . CAD (coronary artery disease)     PAST SURGICAL HISTORY:   Past Surgical History  Procedure Laterality Date  . Abdominal hysterectomy      SOCIAL HISTORY:   Social History  Substance Use Topics  . Smoking status: Current Every Day Smoker  . Smokeless tobacco: Not on file  . Alcohol Use: No    FAMILY HISTORY:   Family History  Problem Relation Age of Onset  . Cancer Brother   . Coronary artery disease Father   . Coronary artery disease Mother      DRUG ALLERGIES:   Allergies  Allergen Reactions  . Azithromycin Rash    MEDICATIONS AT HOME:   Prior to Admission medications   Medication Sig Start Date End Date Taking? Authorizing Provider  amoxicillin (AMOXIL) 875 MG tablet Take 875 mg by mouth 2 (two) times daily. for 10 days 09/17/15 09/26/15 Yes Historical Provider, MD  clopidogrel (PLAVIX) 75 MG tablet Take 1 tablet by mouth daily. 08/23/15  Yes Historical Provider, MD  metoprolol tartrate (LOPRESSOR) 25 MG tablet Take 1 tablet by mouth 2 (two) times daily. 08/23/15  Yes Historical Provider, MD  SUBOXONE 8-2 MG FILM Place 1 Film under the tongue 3 (three) times daily. 09/14/15  Yes Historical Provider, MD    REVIEW OF SYSTEMS:  Review of Systems  Constitutional: Positive for malaise/fatigue. Negative for fever, chills and weight loss.  HENT: Negative for ear pain, hearing loss and tinnitus.   Eyes: Negative for blurred vision, double vision, pain and redness.  Respiratory: Positive for cough and shortness of breath. Negative for hemoptysis.   Cardiovascular: Positive for chest pain (left-sided, pleuritic). Negative for palpitations, orthopnea and leg swelling.  Gastrointestinal: Negative for nausea, vomiting, abdominal pain, diarrhea and constipation.  Genitourinary: Negative for dysuria, frequency and hematuria.  Musculoskeletal: Negative for back pain, joint pain and neck pain.  Skin:       No acne, rash, or lesions  Neurological: Negative for dizziness, tremors, focal weakness and weakness.  Endo/Heme/Allergies: Negative for polydipsia.  Does not bruise/bleed easily.  Psychiatric/Behavioral: Negative for depression. The patient is not nervous/anxious and does not have insomnia.      VITAL SIGNS:   Filed Vitals:   09/24/15 1659 09/24/15 1819 09/24/15 1819 09/24/15 1830  BP: 161/73 140/64 140/64 133/49  Pulse: 110 105 106   Temp: 99.1 F (37.3 C)     TempSrc: Oral     Resp: 20 20 18 19   Height: 4\' 11"  (1.499 m)      Weight: 61.689 kg (136 lb)     SpO2: 96% 95% 97%    Wt Readings from Last 3 Encounters:  09/24/15 61.689 kg (136 lb)    PHYSICAL EXAMINATION:  Physical Exam  Vitals reviewed. Constitutional: She is oriented to person, place, and time. She appears well-developed and well-nourished. No distress.  HENT:  Head: Normocephalic and atraumatic.  Dry mucous membranes  Eyes: Conjunctivae and EOM are normal. Pupils are equal, round, and reactive to light. No scleral icterus.  Neck: Normal range of motion. Neck supple. No JVD present. No thyromegaly present.  Cardiovascular: Regular rhythm and intact distal pulses.  Exam reveals no gallop and no friction rub.   No murmur heard. Tachycardic  Respiratory: Effort normal. No respiratory distress. She has no wheezes.  Sounds absent left lower lung field, with crackles in mid lung field, otherwise clear throughout  GI: Soft. Bowel sounds are normal. She exhibits no distension. There is no tenderness.  Musculoskeletal: Normal range of motion. She exhibits no edema.  No arthritis, no gout  Lymphadenopathy:    She has no cervical adenopathy.  Neurological: She is alert and oriented to person, place, and time. No cranial nerve deficit.  No dysarthria, no aphasia  Skin: Skin is warm and dry. No rash noted. No erythema.  Psychiatric: She has a normal mood and affect. Her behavior is normal. Judgment and thought content normal.    LABORATORY PANEL:   CBC  Recent Labs Lab 09/24/15 1807  WBC 14.0*  HGB 13.7  HCT 41.8  PLT 356   ------------------------------------------------------------------------------------------------------------------  Chemistries   Recent Labs Lab 09/24/15 1807  NA 140  K 4.3  CL 109  CO2 20*  GLUCOSE 92  BUN 20  CREATININE 0.65  CALCIUM 9.1  AST 19  ALT 12*  ALKPHOS 73  BILITOT 0.3    ------------------------------------------------------------------------------------------------------------------  Cardiac Enzymes  Recent Labs Lab 09/24/15 1807  TROPONINI <0.03   ------------------------------------------------------------------------------------------------------------------  RADIOLOGY:  Dg Chest 2 View  09/24/2015  CLINICAL DATA:  Left side pain with deep breathing starting Wednesday EXAM: CHEST  2 VIEW COMPARISON:  01/27/2014 FINDINGS: Cardiomediastinal silhouette is stable. There is moderate size left pleural effusion with left lower lobe atelectasis or infiltrate. Right lung is clear. No pulmonary edema. IMPRESSION: Moderate left pleural effusion with left lower lobe atelectasis or infiltrate. No pulmonary edema. Electronically Signed   By: Lahoma Crocker M.D.   On: 09/24/2015 18:54   Ct Angio Chest Pe W/cm &/or Wo Cm  09/24/2015  CLINICAL DATA:  Reports left side pain with deep breathing. Seen at [facility] and dx with plural effusion. No resp distress EXAM: CT ANGIOGRAPHY CHEST WITH CONTRAST TECHNIQUE: Multidetector CT imaging of the chest was performed using the standard protocol during bolus administration of intravenous contrast. Multiplanar CT image reconstructions and MIPs were obtained to evaluate the vascular anatomy. CONTRAST:  100 cc Isovue 370 COMPARISON:  01/27/2014 FINDINGS: Heart:  Heart size is normal.  There is trace pericardial effusion. Vascular structures: There is atherosclerotic calcification  of the thoracic aorta. No aneurysm. Note is made of a discrete origin for the left vertebral artery from the aortic arch. The pulmonary arteries are well opacified by contrast bolus. No acute pulmonary embolus. Mediastinum/thyroid: The visualized portion of the thyroid gland has a normal appearance. Esophagus has a normal appearance. There are small mediastinal lymph nodes, measuring 1.0 cm in the prevascular region, 1.0 cm in the pre carinal region, 1.3 cm in the left  infrahilar region. Lungs/Airways: There is a moderate left pleural effusion. In the right upper lobe there is a 7 x 6 mm nodule (7 mm mean diameter) on image 35 of series 6. Appearance is unchanged since previous chest CT from 2014. Mild emphysematous changes are present. There is smooth septal thickening of the lungs bilaterally. Left lower lobe consolidation is present. Upper abdomen: Gallbladder is present. Benign left adrenal lesion is fatty attenuation consistent with myelolipoma measuring 3.3 cm. Chest wall/osseous structures: Degenerative changes are seen in the spine. No suspicious lytic or blastic lesions are identified. Review of the MIP images confirms the above findings. IMPRESSION: 1. Moderate left pleural effusion and left lower lobe consolidation, favored to be benign. However, follow-up is recommended to document clearing. 2. Stable appearance of right upper lobe 7 mm nodule. Given the long-term stability, this process is benign and no specific follow-up is necessary. 3. Small mediastinal lymph nodes may be reactive. 4. Trace pericardial effusion. Electronically Signed   By: Nolon Nations M.D.   On: 09/24/2015 20:12    EKG:   Orders placed or performed during the hospital encounter of 09/24/15  . ED EKG  . ED EKG  . EKG 12-Lead  . EKG 12-Lead    IMPRESSION AND PLAN:  Principal Problem:   Sepsis ([place]) - leukocytosis and tachycardia, hemodynamically stable, sources left-sided pneumonia, lactic acid pending. IV antibiotics started in the ED, we will use IV vancomycin and Zosyn initially on admission. Blood culture sent from the ED. Active Problems:   Pleural effusion on left - likely infectious in origin, we will get an ultrasound guided thoracentesis tomorrow with labs for further evaluation   CAP (community acquired pneumonia) - IV antibiotics and cultures as above, add sputum culture patient is able to produce sputum.   CAD (coronary artery disease) - continue home meds   HTN  (hypertension) - continue home meds  All the records are reviewed and case discussed with ED provider. Management plans discussed with the patient and/or family.  DVT PROPHYLAXIS: SubQ lovenox  GI PROPHYLAXIS: None  ADMISSION STATUS: Inpatient  CODE STATUS: Full Code Status History    This patient does not have a recorded code status. Please follow your organizational policy for patients in this situation.      TOTAL TIME TAKING CARE OF THIS PATIENT: 45 minutes.    Kathryn Garrett [place] 09/24/2015, 9:22 PM  [facility]  Office  [phone]  CC: Primary care physician; No primary care provider on file.

## 2015-09-25 ENCOUNTER — Inpatient Hospital Stay: Payer: Medicare HMO

## 2015-09-25 LAB — CBC
HCT: 37 % (ref 35.0–47.0)
Hemoglobin: 12.2 g/dL (ref 12.0–16.0)
MCH: 31.2 pg (ref 26.0–34.0)
MCHC: 33.1 g/dL (ref 32.0–36.0)
MCV: 94.4 fL (ref 80.0–100.0)
PLATELETS: 340 10*3/uL (ref 150–440)
RBC: 3.92 MIL/uL (ref 3.80–5.20)
RDW: 13.2 % (ref 11.5–14.5)
WBC: 12.1 10*3/uL — ABNORMAL HIGH (ref 3.6–11.0)

## 2015-09-25 LAB — AMYLASE: Amylase: 71 U/L (ref 28–100)

## 2015-09-25 LAB — COMPREHENSIVE METABOLIC PANEL
ALT: 11 U/L — AB (ref 14–54)
ANION GAP: 3 — AB (ref 5–15)
AST: 14 U/L — ABNORMAL LOW (ref 15–41)
Albumin: 3.2 g/dL — ABNORMAL LOW (ref 3.5–5.0)
Alkaline Phosphatase: 70 U/L (ref 38–126)
BILIRUBIN TOTAL: 0.3 mg/dL (ref 0.3–1.2)
BUN: 15 mg/dL (ref 6–20)
CALCIUM: 8.8 mg/dL — AB (ref 8.9–10.3)
CO2: 26 mmol/L (ref 22–32)
CREATININE: 0.66 mg/dL (ref 0.44–1.00)
Chloride: 110 mmol/L (ref 101–111)
GFR calc Af Amer: >60 mL/min (ref >60)
Glucose, Bld: 119 mg/dL — ABNORMAL HIGH (ref 65–99)
Potassium: 4 mmol/L (ref 3.5–5.1)
Sodium: 139 mmol/L (ref 135–145)
Total Protein: 6.5 g/dL (ref 6.5–8.1)

## 2015-09-25 LAB — LACTATE DEHYDROGENASE, PLEURAL OR PERITONEAL FLUID: LD, Fluid: 247 U/L — ABNORMAL HIGH (ref 3–23)

## 2015-09-25 LAB — BODY FLUID CELL COUNT WITH DIFFERENTIAL
Eos, Fluid: 0 %
Lymphs, Fluid: 31 %
Monocyte-Macrophage-Serous Fluid: 4 %
NEUTROPHIL FLUID: 65 %
OTHER CELLS FL: 0 %
Total Nucleated Cell Count, Fluid: 3037 cu mm

## 2015-09-25 LAB — PROTEIN, BODY FLUID: Total protein, fluid: 4.5 g/dL

## 2015-09-25 LAB — GLUCOSE, SEROUS FLUID: GLUCOSE FL: 127 mg/dL

## 2015-09-25 LAB — LACTIC ACID, PLASMA: Lactic Acid, Venous: 0.7 mmol/L (ref 0.5–2.0)

## 2015-09-25 LAB — LACTATE DEHYDROGENASE: LDH: 136 U/L (ref 98–192)

## 2015-09-25 IMAGING — CR DG CHEST 1V
1 series · 1 of 1 positions shown · non-contrast
Comparison: [DATE]

CLINICAL DATA: Status post left thoracentesis, left pleural
effusion, shortness of breath

EXAM:
CHEST 1 VIEW

[chest ap]
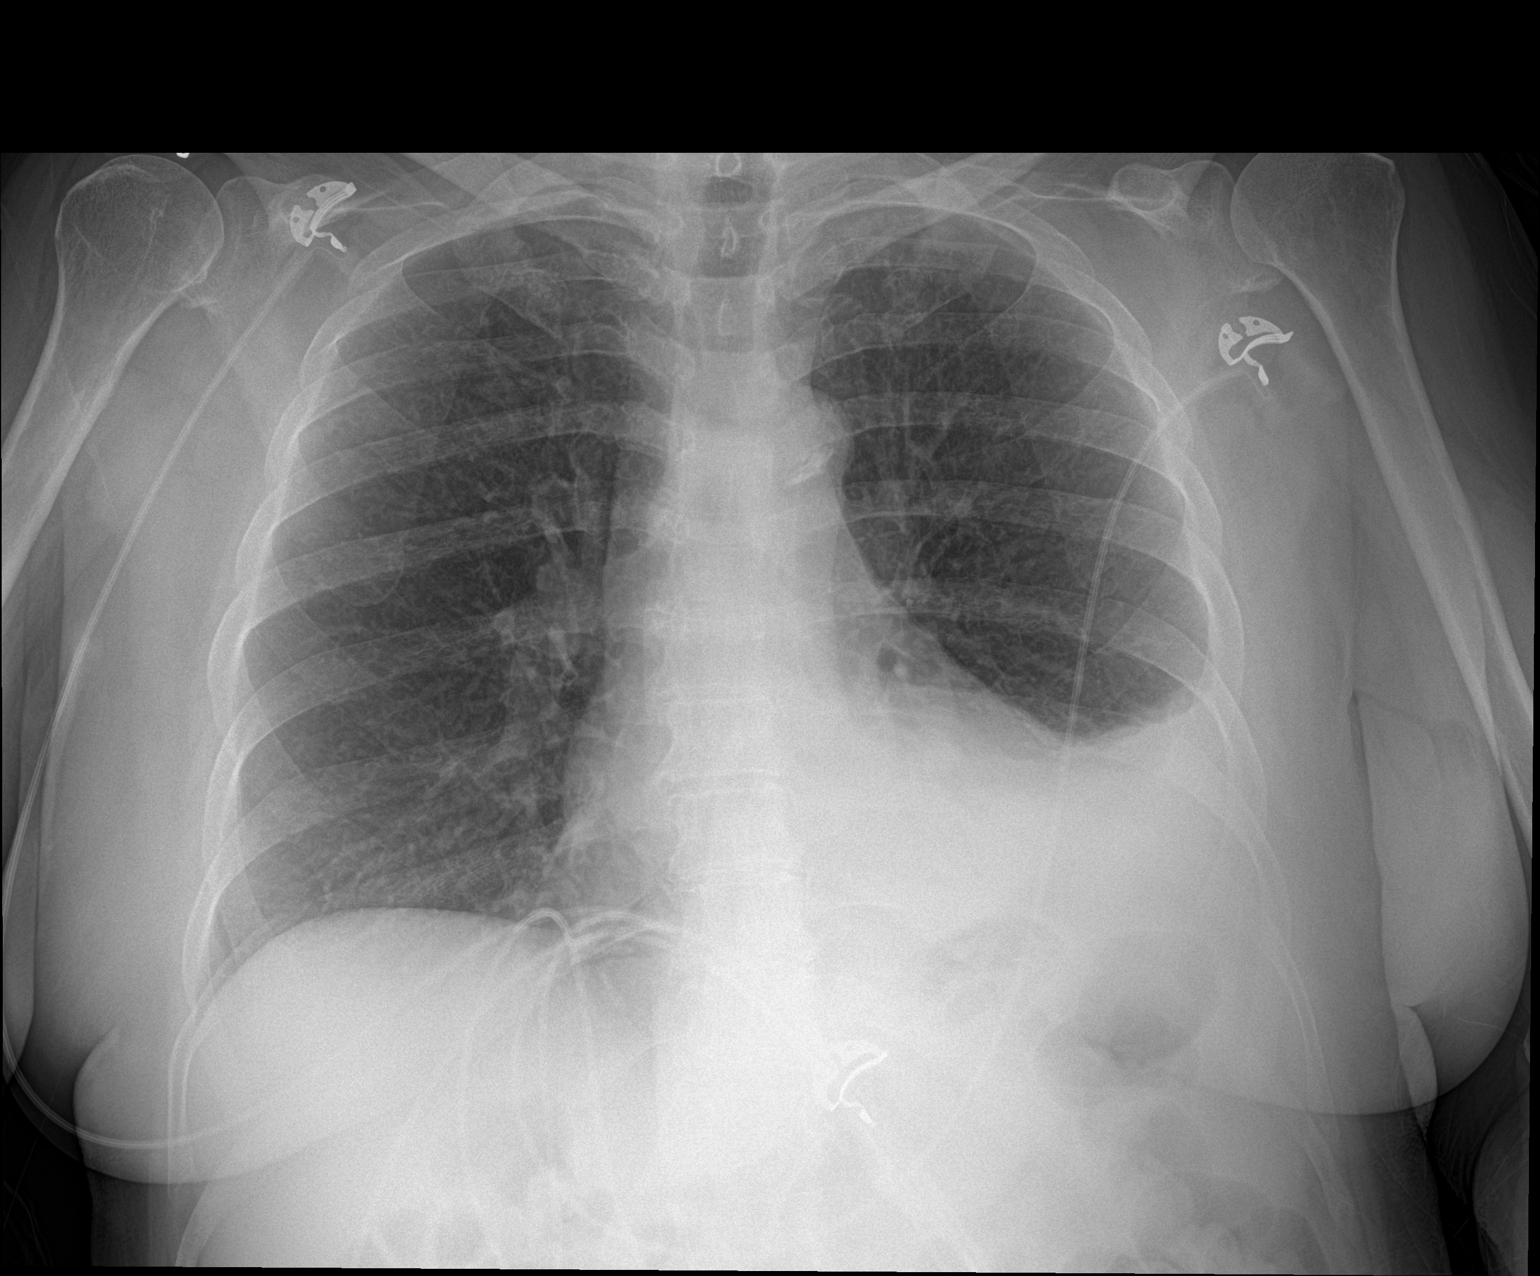

[1 of 1 positions shown; findings below may reference images not displayed]

FINDINGS: Slight decrease in the left effusion following left thoracentesis
removing 700 cc pleural fluid. Minimal improvement in the lingular
aeration. No pneumothorax. Right lung remains clear. Normal heart
size and vascularity. Trachea midline. Atherosclerosis of the aorta
evident. Degenerative changes of the spine.
IMPRESSION: Slight improvement in the left effusion following thoracentesis
yielding 700 cc.

No complicating feature or pneumothorax.

## 2015-09-25 IMAGING — US US THORACENTESIS ASP PLEURAL SPACE W/IMG GUIDE
1 series · 5 of 5 positions shown · non-contrast
Comparison: none

INDICATION: Large left pleural effusion and left lower lobe collapse. Shortness
of breath.

[Series 1: us thoracentesis asp pleural space w/img guide · 0.28mm/px · 5 of 5 slices shown]
[im 1/5]
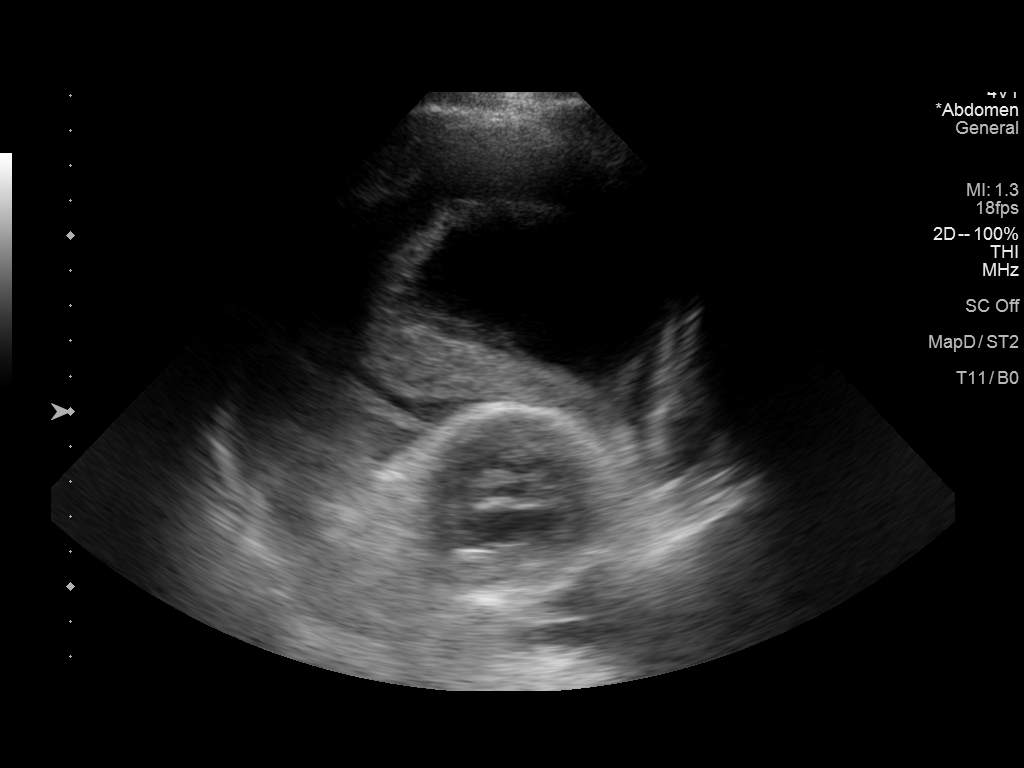
[im 2/5]
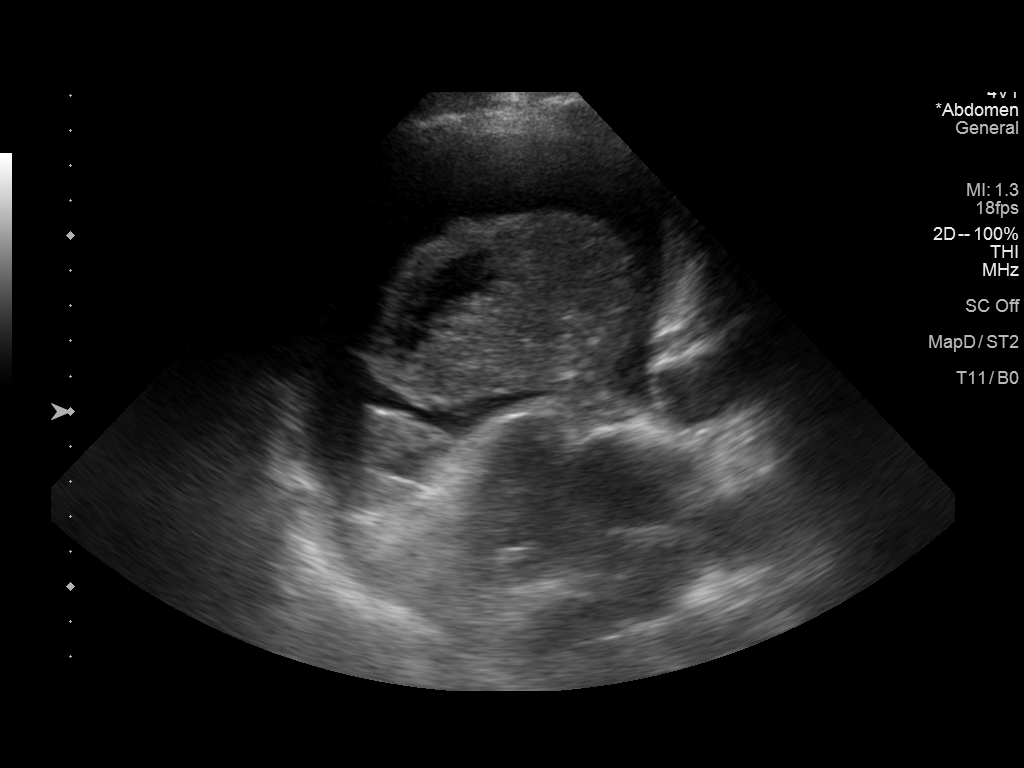
[im 3/5]
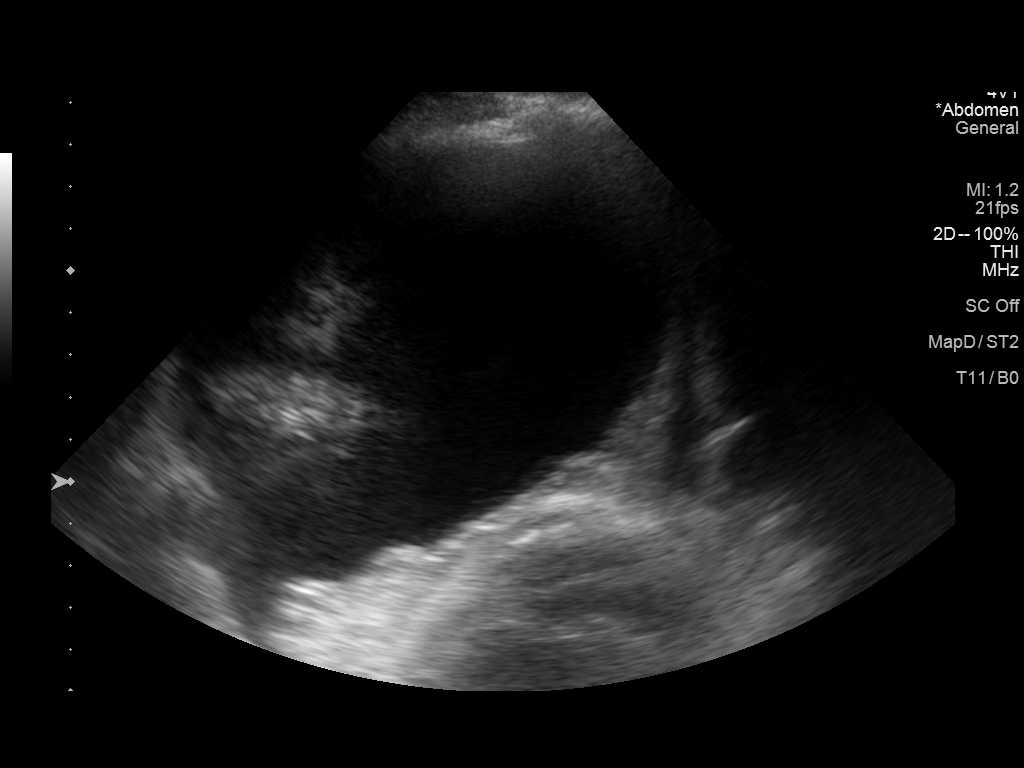
[im 4/5]
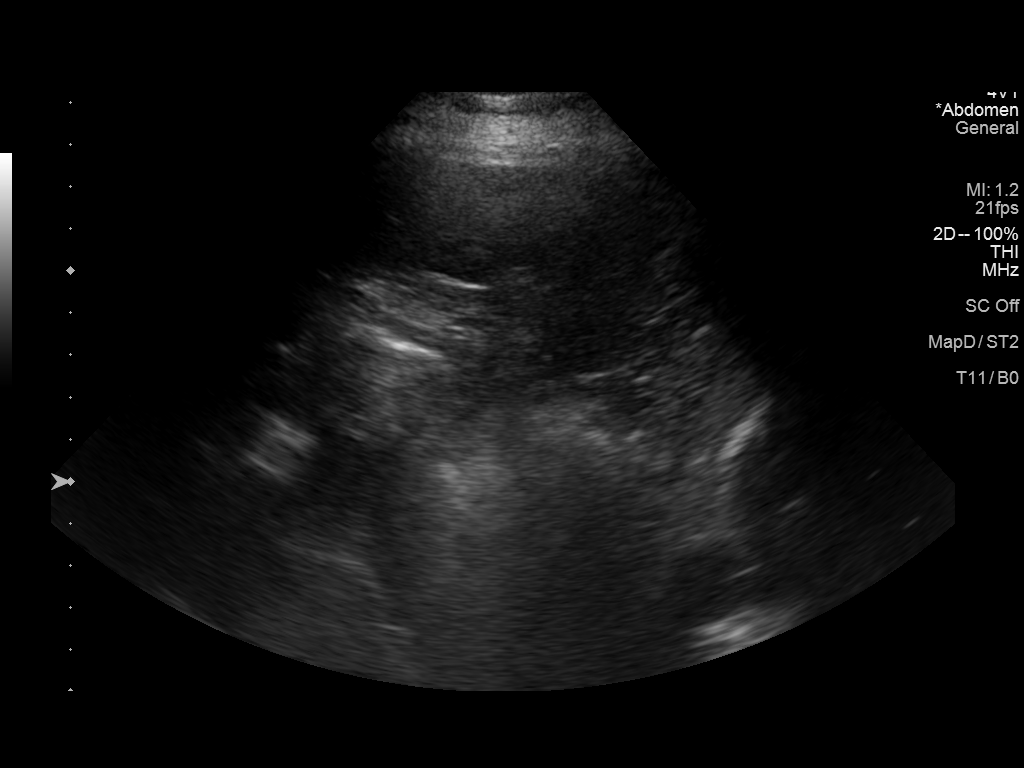
[im 5/5]
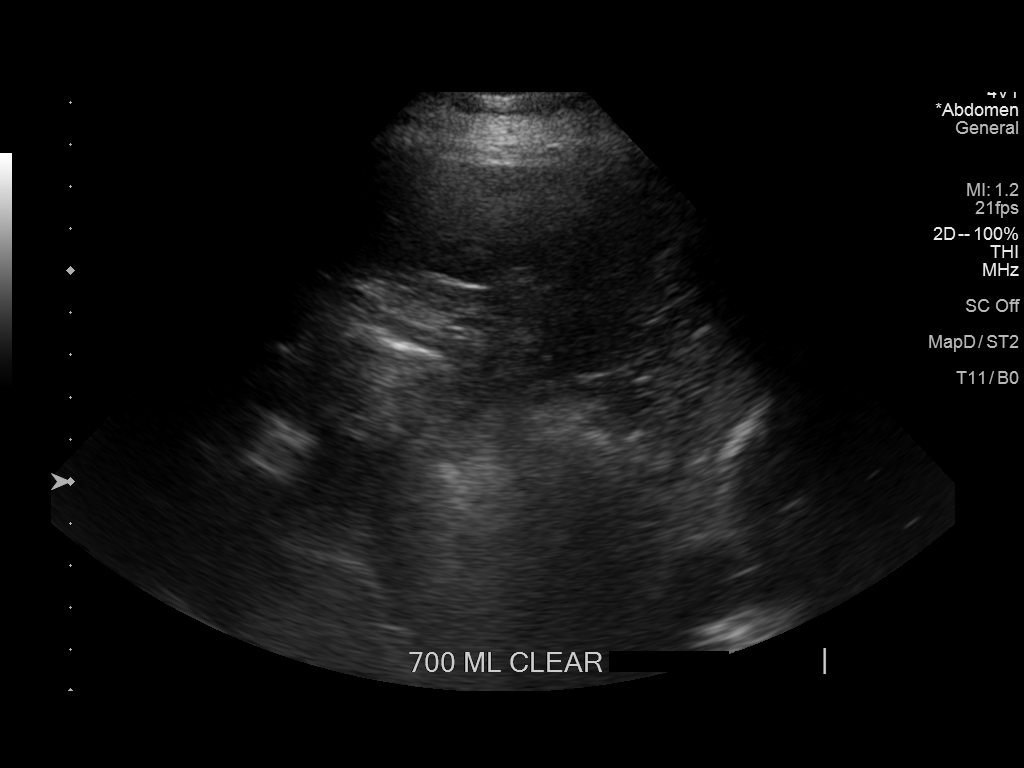

[5 of 5 positions shown; findings below may reference images not displayed]

EXAM:
ULTRASOUND GUIDED LEFT THORACENTESIS

MEDICATIONS:
1% lidocaine locally

COMPLICATIONS:
None immediate.

PROCEDURE:
An ultrasound guided thoracentesis was thoroughly discussed with the
patient and questions answered. The benefits, risks, alternatives
and complications were also discussed. The patient understands and
wishes to proceed with the procedure. Written consent was obtained.

Ultrasound was performed to localize and mark an adequate pocket of
fluid in the left chest. The area was then prepped and draped in the
normal sterile fashion. 1% Lidocaine was used for local anesthesia.
Under ultrasound guidance a 6 Fr Safe-T-Centesis catheter was
introduced. Thoracentesis was performed. The catheter was removed
and a dressing applied.
FINDINGS: A total of approximately 700 cc of clear pleural fluid was removed.
Samples were sent to the laboratory as requested by the clinical
team.
IMPRESSION: Successful ultrasound guided left thoracentesis yielding 700 cc of
pleural fluid.

## 2015-09-25 MED ORDER — MORPHINE SULFATE (PF) 2 MG/ML IV SOLN
2.0000 mg | Freq: Once | INTRAVENOUS | Status: AC
  Administered 2015-09-25: 2 mg via INTRAVENOUS
  Filled 2015-09-25: qty 1

## 2015-09-25 NOTE — Progress Notes (Signed)
Pt arrived from her procedure. She was having significant pain in her left side/flank area.  Pt medicated with oxycodone

## 2015-09-25 NOTE — Progress Notes (Signed)
Dr Ether Griffins gave me an update that radiology plans to do thoracentesis either today or tomorrow. Clear liquid diet ordered

## 2015-09-25 NOTE — Progress Notes (Signed)
Pt left for thoracentesis

## 2015-09-25 NOTE — Procedures (Signed)
Successful LT THORACENTESIS 700cc removed Labs sent No comp Full report in PACS

## 2015-09-25 NOTE — Progress Notes (Signed)
Pt. Still hurting 8 out of 10 on left side and pain med not due at this time. Dr. Jannifer Franklin notified and ordered morphine 2mg  IV once.

## 2015-09-25 NOTE — Progress Notes (Signed)
[place] at [place] NAME: Kathryn Garrett    MR#:  [id number]  DATE OF BIRTH:  05-28-1945  SUBJECTIVE:  CHIEF COMPLAINT:   Chief Complaint  Patient presents with  . Pleurisy  Patient is 70 year old Caucasian female with past history significant for history of coronary artery disease, CHF, hypertension, who presents to the hospital with complaints of shortness of breath, left-sided chest pains. Apparently patient was diagnosed with bronchitis couple weeks ago, given azithromycin, however, over the past few days she's been feeling poorly, having chills, nonproductive cough, left-sided chest pain. She was seen in emergency room, where she was noted to be tachycardic, had leukocytosis on lab studies, CT scan revealed pleural effusion. Patient was admitted for therapy due to concerns of empyema. I discussed case with the radiologist, who is going to perform thoracentesis this weekend.   Review of Systems  Unable to perform ROS: medical condition  Constitutional: Positive for chills and malaise/fatigue. Negative for fever and weight loss.  HENT: Negative for congestion.   Eyes: Negative for blurred vision and double vision.  Respiratory: Positive for cough and shortness of breath. Negative for sputum production and wheezing.   Cardiovascular: Positive for chest pain. Negative for palpitations, orthopnea, leg swelling and PND.  Gastrointestinal: Negative for nausea, vomiting, abdominal pain, diarrhea, constipation and blood in stool.  Genitourinary: Negative for dysuria, urgency, frequency and hematuria.  Musculoskeletal: Negative for falls.  Neurological: Negative for dizziness, tremors, focal weakness and headaches.  Endo/Heme/Allergies: Does not bruise/bleed easily.  Psychiatric/Behavioral: Negative for depression. The patient does not have insomnia.     VITAL SIGNS: Blood pressure 140/64, pulse 93, temperature 98 F (36.7 C), temperature  source Oral, resp. rate 18, height 4\' 11"  (1.499 m), weight 61.961 kg (136 lb 9.6 oz), SpO2 94 %.  PHYSICAL EXAMINATION:   GENERAL:  70 y.o.-year-old patient lying in the bed in moderate distress, flushed in the face, somewhat somnolent, and uncomfortable, suffering facial expression.  EYES: Pupils equal, round, reactive to light and accommodation. No scleral icterus. Extraocular muscles intact.  HEENT: Head atraumatic, normocephalic. Oropharynx and nasopharynx clear.  NECK:  Supple, no jugular venous distention. No thyroid enlargement, no tenderness.  LUNGS: Diminished breath sounds on the left, some dullness to percussion in the lower part of the lung, intermittent crackles, normal air entrance on the right, no wheezing, rales,rhonchi or crepitation. No use of accessory muscles of respiration.  CARDIOVASCULAR: S1, S2 normal. No murmurs, rubs, or gallops.  ABDOMEN: Soft, nontender, nondistended. Bowel sounds present. No organomegaly or mass.  EXTREMITIES: No pedal edema, cyanosis, or clubbing.  NEUROLOGIC: Cranial nerves II through XII are intact. Muscle strength 5/5 in all extremities. Sensation intact. Gait not checked.  PSYCHIATRIC: The patient is somnolent , oriented x 3.  SKIN: No obvious rash, lesion, or ulcer.   ORDERS/RESULTS REVIEWED:   CBC  Recent Labs Lab 09/24/15 1807 09/25/15 0438  WBC 14.0* 12.1*  HGB 13.7 12.2  HCT 41.8 37.0  PLT 356 340  MCV 95.4 94.4  MCH 31.3 31.2  MCHC 32.8 33.1  RDW 13.4 13.2   ------------------------------------------------------------------------------------------------------------------  Chemistries   Recent Labs Lab 09/24/15 1807 09/25/15 0438  NA 140 139  K 4.3 4.0  CL 109 110  CO2 20* 26  GLUCOSE 92 119*  BUN 20 15  CREATININE 0.65 0.66  CALCIUM 9.1 8.8*  AST 19 14*  ALT 12* 11*  ALKPHOS 73 70  BILITOT 0.3 0.3    ------------------------------------------------------------------------------------------------------------------ estimated  creatinine clearance is 52.4 mL/min (by C-G formula based on Cr of 0.66). ------------------------------------------------------------------------------------------------------------------ No results for input(s): TSH, T4TOTAL, T3FREE, THYROIDAB in the last 72 hours.  Invalid input(s): FREET3  Cardiac Enzymes  Recent Labs Lab 09/24/15 1807  TROPONINI <0.03   ------------------------------------------------------------------------------------------------------------------ Invalid input(s): POCBNP ---------------------------------------------------------------------------------------------------------------  RADIOLOGY: Dg Chest 2 View  09/24/2015  CLINICAL DATA:  Left side pain with deep breathing starting Wednesday EXAM: CHEST  2 VIEW COMPARISON:  01/27/2014 FINDINGS: Cardiomediastinal silhouette is stable. There is moderate size left pleural effusion with left lower lobe atelectasis or infiltrate. Right lung is clear. No pulmonary edema. IMPRESSION: Moderate left pleural effusion with left lower lobe atelectasis or infiltrate. No pulmonary edema. Electronically Signed   By: Lahoma Crocker M.D.   On: 09/24/2015 18:54   Ct Angio Chest Pe W/cm &/or Wo Cm  09/24/2015  CLINICAL DATA:  Reports left side pain with deep breathing. Seen at [facility] and dx with plural effusion. No resp distress EXAM: CT ANGIOGRAPHY CHEST WITH CONTRAST TECHNIQUE: Multidetector CT imaging of the chest was performed using the standard protocol during bolus administration of intravenous contrast. Multiplanar CT image reconstructions and MIPs were obtained to evaluate the vascular anatomy. CONTRAST:  100 cc Isovue 370 COMPARISON:  01/27/2014 FINDINGS: Heart:  Heart size is normal.  There is trace pericardial effusion. Vascular structures: There is atherosclerotic calcification of the thoracic aorta. No  aneurysm. Note is made of a discrete origin for the left vertebral artery from the aortic arch. The pulmonary arteries are well opacified by contrast bolus. No acute pulmonary embolus. Mediastinum/thyroid: The visualized portion of the thyroid gland has a normal appearance. Esophagus has a normal appearance. There are small mediastinal lymph nodes, measuring 1.0 cm in the prevascular region, 1.0 cm in the pre carinal region, 1.3 cm in the left infrahilar region. Lungs/Airways: There is a moderate left pleural effusion. In the right upper lobe there is a 7 x 6 mm nodule (7 mm mean diameter) on image 35 of series 6. Appearance is unchanged since previous chest CT from 2014. Mild emphysematous changes are present. There is smooth septal thickening of the lungs bilaterally. Left lower lobe consolidation is present. Upper abdomen: Gallbladder is present. Benign left adrenal lesion is fatty attenuation consistent with myelolipoma measuring 3.3 cm. Chest wall/osseous structures: Degenerative changes are seen in the spine. No suspicious lytic or blastic lesions are identified. Review of the MIP images confirms the above findings. IMPRESSION: 1. Moderate left pleural effusion and left lower lobe consolidation, favored to be benign. However, follow-up is recommended to document clearing. 2. Stable appearance of right upper lobe 7 mm nodule. Given the long-term stability, this process is benign and no specific follow-up is necessary. 3. Small mediastinal lymph nodes may be reactive. 4. Trace pericardial effusion. Electronically Signed   By: Nolon Nations M.D.   On: 09/24/2015 20:12    EKG:  Orders placed or performed during the hospital encounter of 09/24/15  . ED EKG  . ED EKG  . EKG 12-Lead  . EKG 12-Lead    ASSESSMENT AND PLAN:  Principal Problem:   Sepsis ([place]) Active Problems:   Pleural effusion on left   CAP (community acquired pneumonia)   CAD (coronary artery disease)   HTN (hypertension)  #1.  Sepsis due to community-acquired pneumonia and suspected left-sided empyema, continue patient on broad-spectrum antibiotic therapy with levofloxacin, vancomycin and Zosyn, thoracentesis will be performed over this weekend, I discussed case with interventional radiologist today. Blood cultures are negative so  far, sputum culture is pending #2. Left-sided Pleural effusion, concerning for empyema, continue antibiotic therapy, awaiting for left thoracentesis. Adjust antibiotics depending on the culture results. #3. Metabolic acidosis, as based on basic metabolic panel on admission, no lactic acidosis, improved today, following closely #4. Leukocytosis, improving with therapy, follow closely #5. Essential hypertension, continue outpatient medications, following closely #6. Sinus tachycardia, resolved with IV fluid administration, supportive therapy #7. Community acquired bacterial pneumonia, failed outpatient therapy, now on broad-spectrum antibiotics, getting sputum cultures, blood cultures are negative so far, adjust antibodies depending on culture results   Management plans discussed with the patient, family and they are in agreement.   DRUG ALLERGIES:  Allergies  Allergen Reactions  . Azithromycin Rash    CODE STATUS:     Code Status Orders        Start     Ordered   09/24/15 2246  Full code   Continuous     09/24/15 2245    Code Status History    Date Active Date Inactive Code Status Order ID Comments User Context   This patient has a current code status but no historical code status.      TOTAL TIME TAKING CARE OF THIS PATIENT: 40 minutes.    Theodoro Grist M.D on 09/25/2015 at 1:01 PM  Between 7am to 6pm - Pager - [phone]  After 6pm go to [email or web address] - password [email or web address] [facility]  [facility]  Office  [phone]  CC: Primary care physician; No primary care provider on file.

## 2015-09-26 ENCOUNTER — Inpatient Hospital Stay: Payer: Medicare HMO

## 2015-09-26 IMAGING — DX DG CHEST 1V PORT
1 series · 1 of 1 positions shown · non-contrast
Comparison: [DATE]

CLINICAL DATA: Follow-up pleural effusion.  Thoracentesis yesterday

EXAM:
PORTABLE CHEST 1 VIEW

[chest ap]
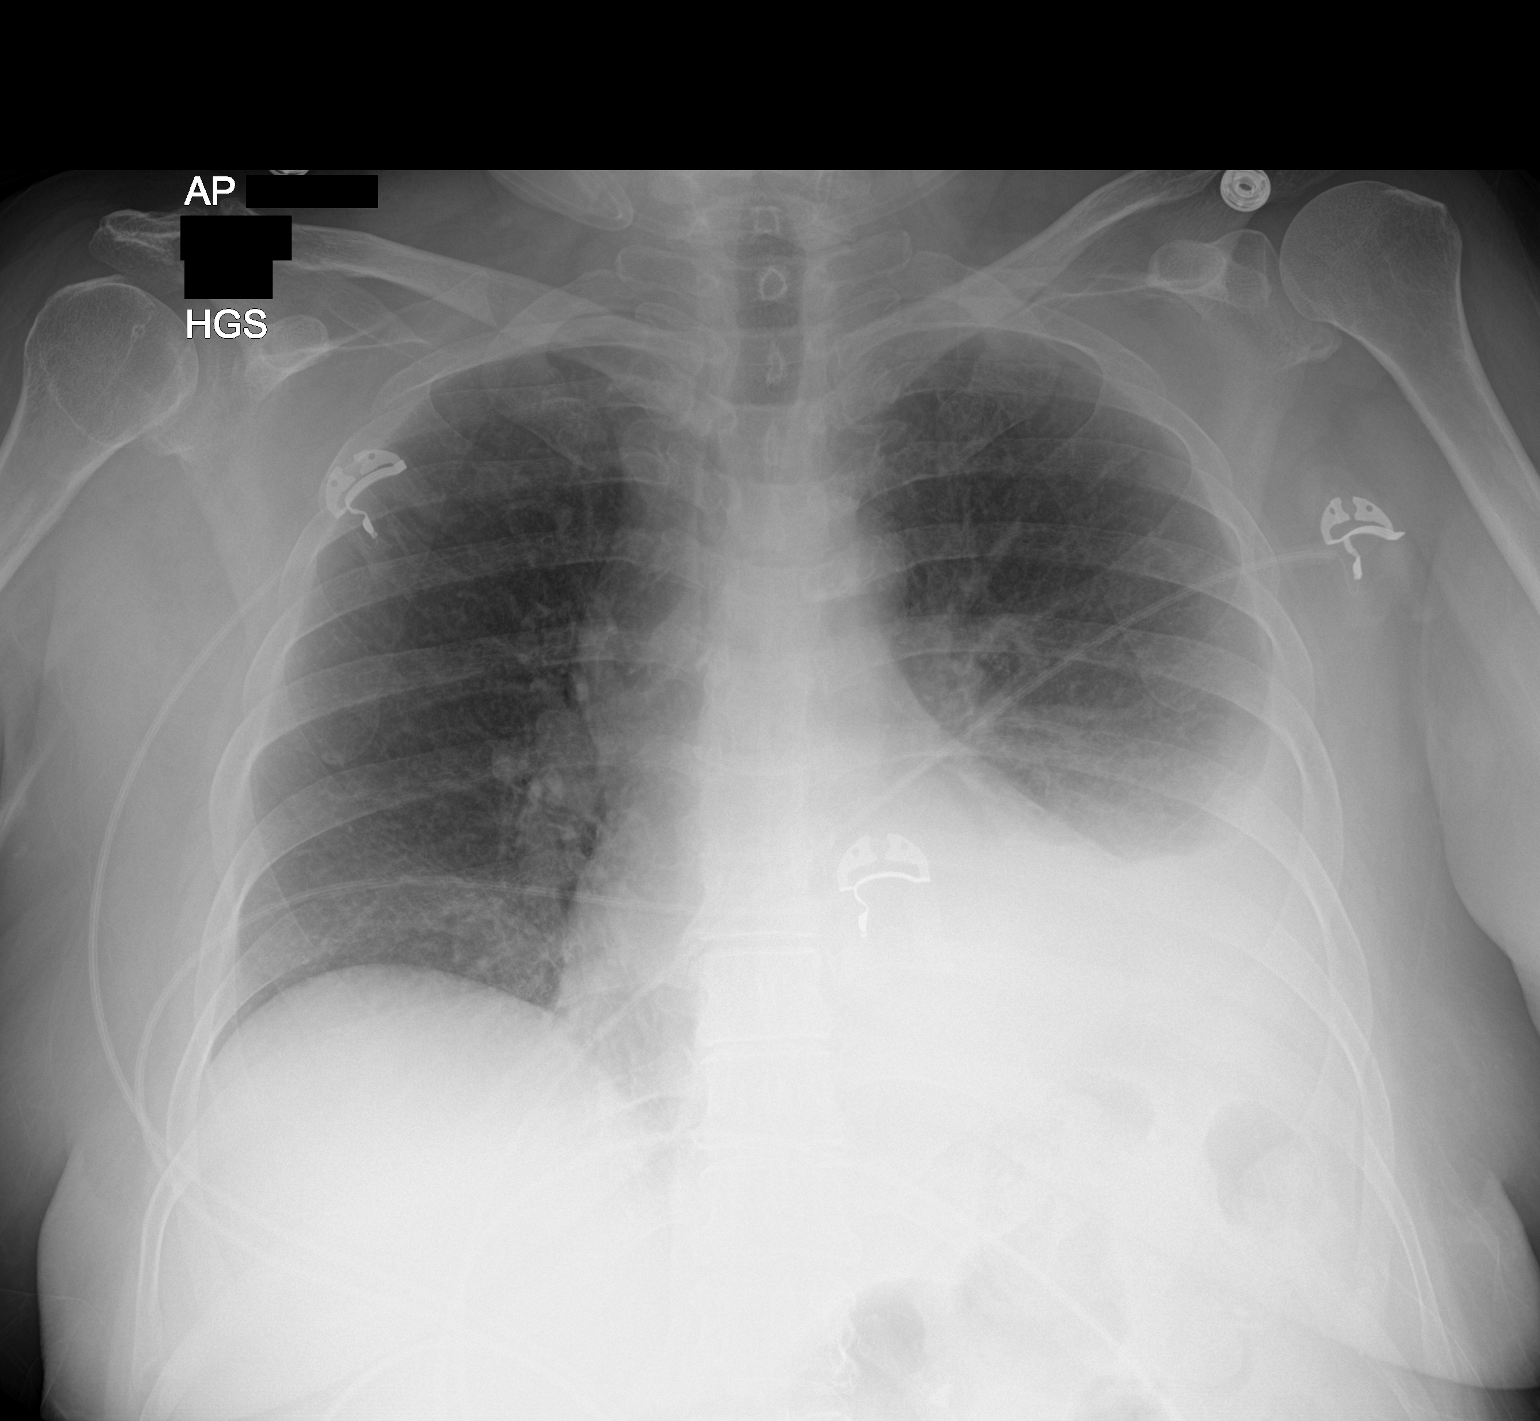

[1 of 1 positions shown; findings below may reference images not displayed]

FINDINGS: Interval increase in left pleural effusion which is now moderate in
size. Compressive atelectasis left lower lobe. No pneumothorax

Right lung clear.
IMPRESSION: Progression of left pleural effusion since yesterday with increase
in left lower lobe atelectasis.

## 2015-09-26 NOTE — Progress Notes (Addendum)
Pt called, c/o sharp chest pain to left side at base of lung. Pt states pain was severe but is now beginning to subside. Lung sounds diminished at left and right base, fine, diminished crackles noted to left base. Notified MD. Portable chest xray ordered STAT. Will notify MD of results.

## 2015-09-26 NOTE — Progress Notes (Signed)
Spoke with Dr. Arther Abbott. Need Pulmonology consult. Notified Dr. Ether Griffins. Consult placed. Will continue to monitor pt, report any changes in status.

## 2015-09-26 NOTE — Care Management Note (Signed)
Case Management Note  Patient Details  Name: Tahje Walkowski MRN: [id number] Date of Birth: 28-Nov-1945  Subjective/Objective:   70yo Mrs Ruqiya Auster was admitted 09/24/15 with pleursy and PNA. Hx: MI, CHF, HTN, Arthritis. Resides at home with her adult son and a grandson. PCP=Regina York at [facility]. Pharmacy=[facility] in [place]. Assistive equipment includes a RW and a wheelchair. No home oxygen. No home health services. Drives her own car and does her own ADL's. Very independent at home. Large number of visitors today. Case management will follow for discharge planning.               Action/Plan:   Expected Discharge Date:                  Expected Discharge Plan:     In-House Referral:     Discharge planning Services     Post Acute Care Choice:    Choice offered to:     DME Arranged:    DME Agency:     HH Arranged:    [place] Agency:     Status of Service:     Medicare Important Message Given:    Date Medicare IM Given:    Medicare IM give by:    Date Additional Medicare IM Given:    Additional Medicare Important Message give by:     If discussed at [place] of Stay Meetings, dates discussed:    Additional Comments:  Paublo Warshawsky A, RN 09/26/2015, 3:12 PM

## 2015-09-26 NOTE — Progress Notes (Signed)
[place] [facility] [place] NAME: Joseph Reith    MR#:  [id number]  DATE OF BIRTH:  [birth year]  SUBJECTIVE:  CHIEF COMPLAINT:   Chief Complaint  Patient presents with  . Pleurisy  Patient is 70 year old Caucasian female with past history significant for history of coronary artery disease, CHF, hypertension, who presents to the hospital with complaints of shortness of breath, left-sided chest pains. Apparently patient was diagnosed with bronchitis couple weeks ago, given azithromycin, however, over the past few days Prior to coming to the hospital she's been feeling poorly, having chills, nonproductive cough, left-sided chest pain. She was seen in emergency room, where she was noted to be tachycardic, had leukocytosis on lab studies, CT scan revealed pleural effusion. Patient was admitted for therapy due to concerns of empyema. The patient underwent thoracentesis 09/26/2015, pain has improved somewhat, less shortness of breath. Pleural fluid studies revealed elevated white blood cell count, LDH, but normal glucose level. Cultures did not show an any growth so far.. The patient is afebrile, white cell count has improved somewhat.  Review of Systems  Constitutional: Positive for malaise/fatigue. Negative for fever, chills and weight loss.  HENT: Negative for congestion.   Eyes: Negative for blurred vision and double vision.  Respiratory: Positive for cough. Negative for sputum production, shortness of breath and wheezing.   Cardiovascular: Positive for chest pain. Negative for palpitations, orthopnea, leg swelling and PND.  Gastrointestinal: Negative for nausea, vomiting, abdominal pain, diarrhea, constipation and blood in stool.  Genitourinary: Negative for dysuria, urgency, frequency and hematuria.  Musculoskeletal: Negative for falls.  Neurological: Negative for dizziness, tremors, focal weakness and headaches.  Endo/Heme/Allergies: Does not  bruise/bleed easily.  Psychiatric/Behavioral: Negative for depression. The patient does not have insomnia.     VITAL SIGNS: Blood pressure 107/50, pulse 71, temperature 98.3 F (36.8 C), temperature source Oral, resp. rate 16, height 4\' 11"  (1.499 m), weight 61.961 kg (136 lb 9.6 oz), SpO2 95 %.  PHYSICAL EXAMINATION:   GENERAL:  70 y.o.-year-old patient lying in the bed in mild distress, more comfortable today, up and eating her breakfast  EYES: Pupils equal, round, reactive to light and accommodation. No scleral icterus. Extraocular muscles intact.  HEENT: Head atraumatic, normocephalic. Oropharynx and nasopharynx clear.  NECK:  Supple, no jugular venous distention. No thyroid enlargement, no tenderness.  LUNGS: Diminished breath sounds on the left at the base, dullness to percussion in the lower part of the lung,  normal air entrance on the right, no wheezing, rales,rhonchi or crepitation. No use of accessory muscles of respiration.  CARDIOVASCULAR: S1, S2 normal. No murmurs, rubs, or gallops.  ABDOMEN: Soft, nontender, nondistended. Bowel sounds present. No organomegaly or mass.  EXTREMITIES: No pedal edema, cyanosis, or clubbing.  NEUROLOGIC: Cranial nerves II through XII are intact. Muscle strength 5/5 in all extremities. Sensation intact. Gait not checked.  PSYCHIATRIC: The patient is alert and oriented x 3.  SKIN: No obvious rash, lesion, or ulcer.   ORDERS/RESULTS REVIEWED:   CBC  Recent Labs Lab 09/24/15 1807 09/25/15 0438  WBC 14.0* 12.1*  HGB 13.7 12.2  HCT 41.8 37.0  PLT 356 340  MCV 95.4 94.4  MCH 31.3 31.2  MCHC 32.8 33.1  RDW 13.4 13.2   ------------------------------------------------------------------------------------------------------------------  Chemistries   Recent Labs Lab 09/24/15 1807 09/25/15 0438  NA 140 139  K 4.3 4.0  CL 109 110  CO2 20* 26  GLUCOSE 92 119*  BUN 20 15  CREATININE 0.65  0.66  CALCIUM 9.1 8.8*  AST 19 14*  ALT 12* 11*   ALKPHOS 73 70  BILITOT 0.3 0.3   ------------------------------------------------------------------------------------------------------------------ estimated creatinine clearance is 52.4 mL/min (by C-G formula based on Cr of 0.66). ------------------------------------------------------------------------------------------------------------------ No results for input(s): TSH, T4TOTAL, T3FREE, THYROIDAB in the last 72 hours.  Invalid input(s): FREET3  Cardiac Enzymes  Recent Labs Lab 09/24/15 1807  TROPONINI <0.03   ------------------------------------------------------------------------------------------------------------------ Invalid input(s): POCBNP ---------------------------------------------------------------------------------------------------------------  RADIOLOGY: Dg Chest 1 View  09/25/2015  CLINICAL DATA:  Status post left thoracentesis, left pleural effusion, shortness of breath EXAM: CHEST 1 VIEW COMPARISON:  09/24/2015 FINDINGS: Slight decrease in the left effusion following left thoracentesis removing 700 cc pleural fluid. Minimal improvement in the lingular aeration. No pneumothorax. Right lung remains clear. Normal heart size and vascularity. Trachea midline. Atherosclerosis of the aorta evident. Degenerative changes of the spine. IMPRESSION: Slight improvement in the left effusion following thoracentesis yielding 700 cc. No complicating feature or pneumothorax. Electronically Signed   By: Jerilynn Mages.  Shick M.D.   On: 09/25/2015 14:31   Dg Chest 2 View  09/24/2015  CLINICAL DATA:  Left side pain with deep breathing starting Wednesday EXAM: CHEST  2 VIEW COMPARISON:  01/27/2014 FINDINGS: Cardiomediastinal silhouette is stable. There is moderate size left pleural effusion with left lower lobe atelectasis or infiltrate. Right lung is clear. No pulmonary edema. IMPRESSION: Moderate left pleural effusion with left lower lobe atelectasis or infiltrate. No pulmonary edema. Electronically  Signed   By: Lahoma Crocker M.D.   On: 09/24/2015 18:54   Ct Angio Chest Pe W/cm &/or Wo Cm  09/24/2015  CLINICAL DATA:  Reports left side pain with deep breathing. Seen at [facility] and dx with plural effusion. No resp distress EXAM: CT ANGIOGRAPHY CHEST WITH CONTRAST TECHNIQUE: Multidetector CT imaging of the chest was performed using the standard protocol during bolus administration of intravenous contrast. Multiplanar CT image reconstructions and MIPs were obtained to evaluate the vascular anatomy. CONTRAST:  100 cc Isovue 370 COMPARISON:  01/27/2014 FINDINGS: Heart:  Heart size is normal.  There is trace pericardial effusion. Vascular structures: There is atherosclerotic calcification of the thoracic aorta. No aneurysm. Note is made of a discrete origin for the left vertebral artery from the aortic arch. The pulmonary arteries are well opacified by contrast bolus. No acute pulmonary embolus. Mediastinum/thyroid: The visualized portion of the thyroid gland has a normal appearance. Esophagus has a normal appearance. There are small mediastinal lymph nodes, measuring 1.0 cm in the prevascular region, 1.0 cm in the pre carinal region, 1.3 cm in the left infrahilar region. Lungs/Airways: There is a moderate left pleural effusion. In the right upper lobe there is a 7 x 6 mm nodule (7 mm mean diameter) on image 35 of series 6. Appearance is unchanged since previous chest CT from 2014. Mild emphysematous changes are present. There is smooth septal thickening of the lungs bilaterally. Left lower lobe consolidation is present. Upper abdomen: Gallbladder is present. Benign left adrenal lesion is fatty attenuation consistent with myelolipoma measuring 3.3 cm. Chest wall/osseous structures: Degenerative changes are seen in the spine. No suspicious lytic or blastic lesions are identified. Review of the MIP images confirms the above findings. IMPRESSION: 1. Moderate left pleural effusion and left lower lobe consolidation, favored to  be benign. However, follow-up is recommended to document clearing. 2. Stable appearance of right upper lobe 7 mm nodule. Given the long-term stability, this process is benign and no specific follow-up is necessary. 3. Small mediastinal lymph nodes  may be reactive. 4. Trace pericardial effusion. Electronically Signed   By: Nolon Nations M.D.   On: 09/24/2015 20:12   US Thoracentesis Asp Pleural Space W/img Guide  09/25/2015  INDICATION: Large left pleural effusion and left lower lobe collapse. Shortness of breath. EXAM: ULTRASOUND GUIDED LEFT THORACENTESIS MEDICATIONS: 1% lidocaine locally COMPLICATIONS: None immediate. PROCEDURE: An ultrasound guided thoracentesis was thoroughly discussed with the patient and questions answered. The benefits, risks, alternatives and complications were also discussed. The patient understands and wishes to proceed with the procedure. Written consent was obtained. Ultrasound was performed to localize and mark an adequate pocket of fluid in the left chest. The area was then prepped and draped in the normal sterile fashion. 1% Lidocaine was used for local anesthesia. Under ultrasound guidance a 6 Fr Safe-T-Centesis catheter was introduced. Thoracentesis was performed. The catheter was removed and a dressing applied. FINDINGS: A total of approximately 700 cc of clear pleural fluid was removed. Samples were sent to the laboratory as requested by the clinical team. IMPRESSION: Successful ultrasound guided left thoracentesis yielding 700 cc of pleural fluid. Electronically Signed   By: Jerilynn Mages.  Shick M.D.   On: 09/25/2015 14:28    EKG:  Orders placed or performed during the hospital encounter of 09/24/15  . ED EKG  . ED EKG  . EKG 12-Lead  . EKG 12-Lead    ASSESSMENT AND PLAN:  Principal Problem:   Sepsis ([place]) Active Problems:   Pleural effusion on left   CAP (community acquired pneumonia)   CAD (coronary artery disease)   HTN (hypertension)  #1. Sepsis due to  community-acquired pneumonia and Parapneumonic effusion, continue patient on broad-spectrum antibiotic therapy with levofloxacin, vancomycin and Zosyn, status post left thoracentesis 09/25/2015. Pleural fluid as well as Blood cultures are negative so far, sputum culture is pending. Patient is clinically better today, follow white blood cell count in the morning. Repeat the chest x-ray revealed improvement of left pleural effusion following thoracentesis yielding 700 cc, no pneumothorax, repeat chest x-ray today again to rule out reaccumulation #2. Left-sided Pleural effusion, likely parapneumonic effusion, status post left thoracentesis 09/25/2015, no evidence of empyema at this time, continue antibiotic therapy . Adjust antibiotics depending on the culture results. #3. Metabolic acidosis, as based on basic metabolic panel on admission, no lactic acidosis, improved , following closely #4. Leukocytosis, improving with therapy, follow closely, recheck BMP tomorrow morning #5. Essential hypertension, continue outpatient medications, good control  #6. Sinus tachycardia, resolved with IV fluid administration, supportive therapy #7. Community acquired bacterial pneumonia, failed outpatient therapy, now on broad-spectrum antibiotics, awaiting for sputum cultures, blood cultures are negative so far, adjust antibodies depending on culture results as above, clinically improved   Management plans discussed with the patient, family and they are in agreement.   DRUG ALLERGIES:  Allergies  Allergen Reactions  . Azithromycin Rash    CODE STATUS:     Code Status Orders        Start     Ordered   09/24/15 2246  Full code   Continuous     09/24/15 2245    Code Status History    Date Active Date Inactive Code Status Order ID Comments User Context   This patient has a current code status but no historical code status.      TOTAL TIME TAKING CARE OF THIS PATIENT: 35 minutes.  Discussed with patient,  all questions answered  Quashon Jesus M.D on 09/26/2015 at 1:41 PM  Between 7am to 6pm - Pager -  [phone]  After 6pm go to [email or web address] - password [email or web address] [facility]  [facility]  Office  [phone]  CC: Primary care physician; No primary care provider on file.

## 2015-09-26 NOTE — Progress Notes (Addendum)
Paged MD. Roosevelt Locks results showed worsening pleural effusion and atelectasis to left side (see results). Dr. Ether Griffins requests surgical consult (called this AM). Pt may need chest tube placement. Consult called again, will await response. Nursing will continue to monitor.

## 2015-09-27 ENCOUNTER — Inpatient Hospital Stay (HOSPITAL_COMMUNITY)
Admit: 2015-09-27 | Discharge: 2015-09-27 | Disposition: A | Discharge status: Home / Self Care | Payer: Medicare HMO | Attending: Internal Medicine | Admitting: Internal Medicine

## 2015-09-27 ENCOUNTER — Inpatient Hospital Stay: Payer: Medicare HMO

## 2015-09-27 DIAGNOSIS — J948 Other specified pleural conditions: Secondary | ICD-10-CM

## 2015-09-27 DIAGNOSIS — R0600 Dyspnea, unspecified: Secondary | ICD-10-CM

## 2015-09-27 IMAGING — DX DG CHEST 1V PORT
1 series · 1 of 1 positions shown · non-contrast
Comparison: [DATE]

CLINICAL DATA: C/o pleural effusion; coronary artery disease, CHF,
hypertension; recent diagnosis bronchitis couple weeks ago, given
azithromycin, however, over the past few days Prior to coming to the
hospital she's been feeling poorly, having chills, nonproductive
cough, left-sided chest pain. Patient was admitted for therapy due
to concerns of empyema. The patient underwent thoracentesis [DATE]

EXAM:
PORTABLE CHEST 1 VIEW

[chest ap]
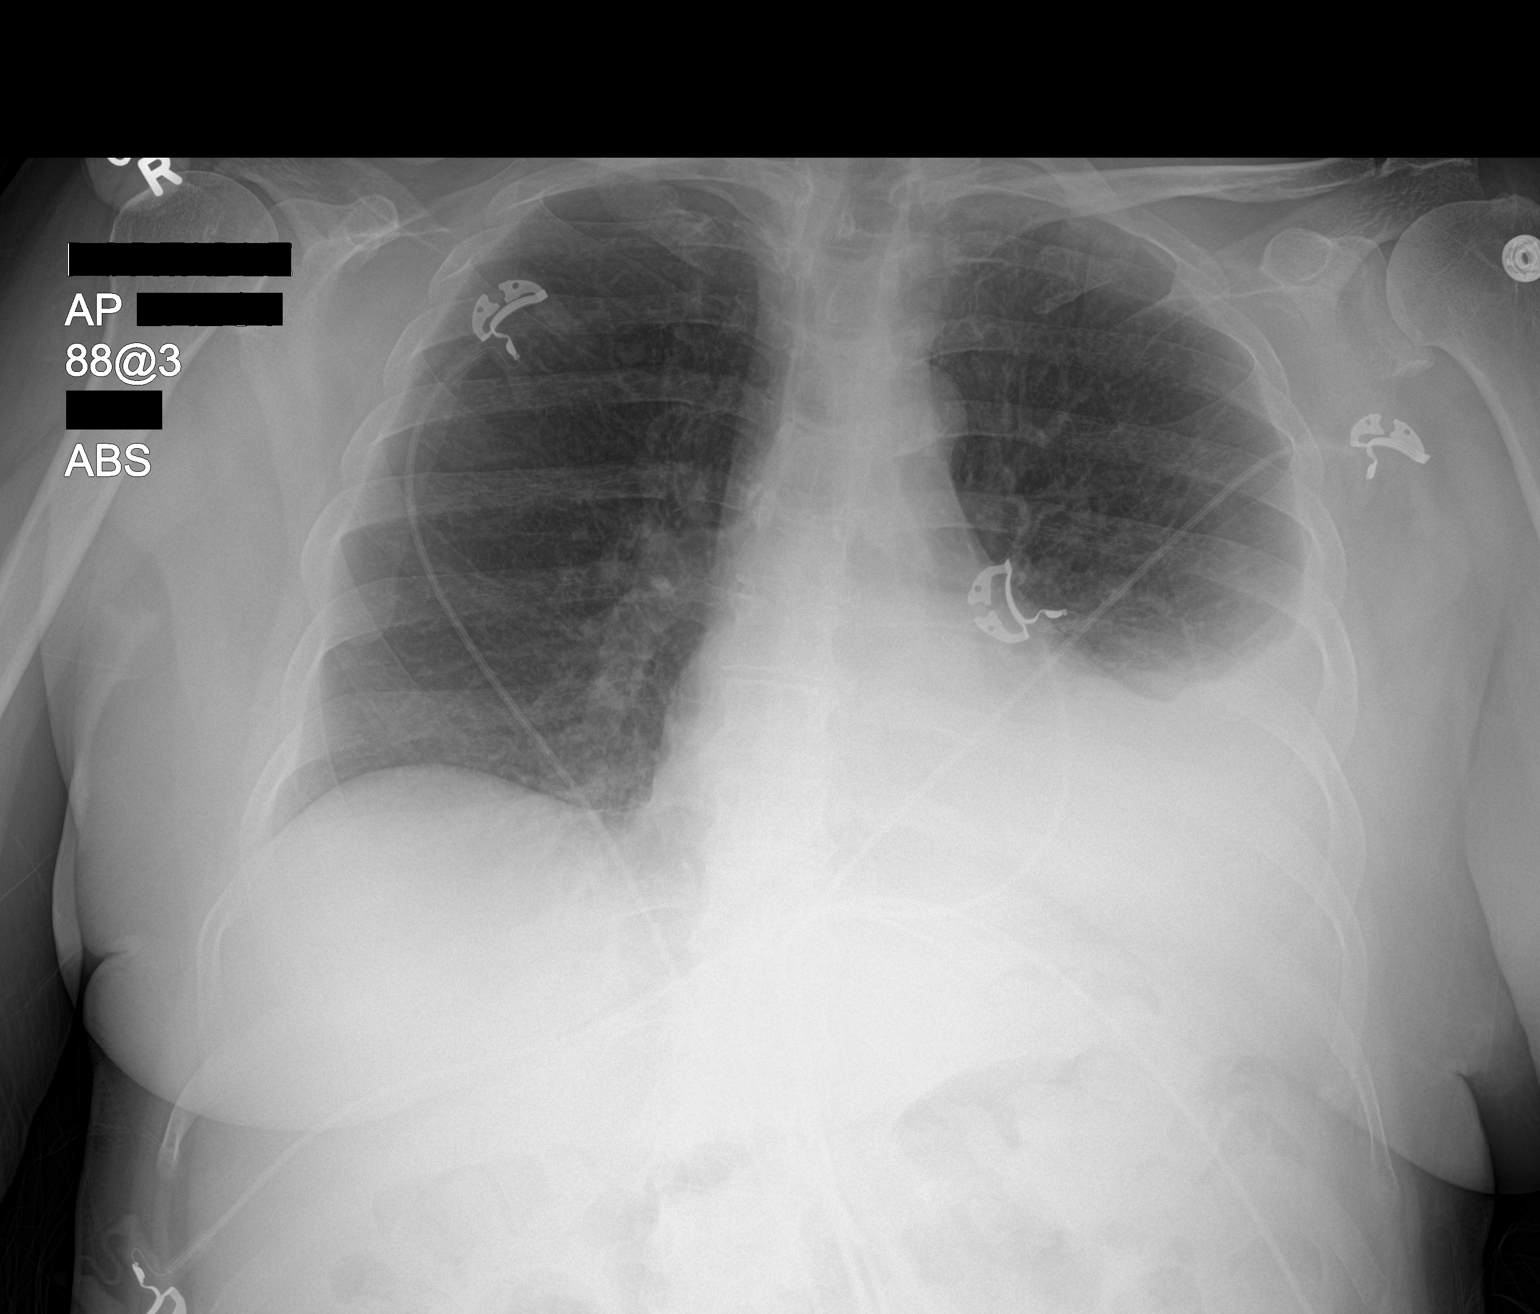

[1 of 1 positions shown; findings below may reference images not displayed]

FINDINGS: No pneumothorax. Moderate left pleural effusion obscures the left
hemidiaphragm and most of the left heart border.

No right pleural effusion.  No pulmonary edema.

Heart, mediastinum and hila are unremarkable.
IMPRESSION: 1. Moderate left pleural effusion is similar to the previous day's
study.
2. No pneumothorax.

## 2015-09-27 MED ORDER — METOPROLOL TARTRATE 25 MG PO TABS
12.5000 mg | ORAL_TABLET | Freq: Two times a day (BID) | ORAL | Status: DC
  Administered 2015-09-27 – 2015-10-01 (×5): 12.5 mg via ORAL
  Filled 2015-09-27 (×5): qty 1

## 2015-09-27 MED ORDER — DOCUSATE SODIUM 100 MG PO CAPS
100.0000 mg | ORAL_CAPSULE | Freq: Two times a day (BID) | ORAL | Status: DC
  Administered 2015-09-27 – 2015-09-30 (×6): 100 mg via ORAL
  Filled 2015-09-27 (×7): qty 1

## 2015-09-27 MED ORDER — SENNA 8.6 MG PO TABS
1.0000 | ORAL_TABLET | Freq: Every day | ORAL | Status: DC
  Administered 2015-09-27 – 2015-09-30 (×3): 8.6 mg via ORAL
  Filled 2015-09-27 (×4): qty 1

## 2015-09-27 NOTE — Progress Notes (Addendum)
   I attempted to reach patient's daughter, Mrs. [name] at [phone]. Unfortunately, I was not able to get to talk to her, the phone was ringing with some beeps, not able even to leave her message.

## 2015-09-27 NOTE — Consult Note (Signed)
[name]      Date: 09/27/2015,   MRN# [id number] Kathryn Garrett 11/29/1945 Code Status:     Code Status Orders        Start     Ordered   09/24/15 2246  Full code   Continuous     09/24/15 2245    Code Status History    Date Active Date Inactive Code Status Order ID Comments User Context   This patient has a current code status but no historical code status.     Hosp day:@LENGTHOFSTAYDAYS @ Referring MD: @ATDPROV @     PCP:      AdmissionWeight: 136 lb (61.689 kg)                 CurrentWeight: 136 lb 9.6 oz (61.961 kg) Kathryn Garrett is a 70 y.o. old female seen in consultation for pleural effusion at the request of Dr. Ether Griffins     CHIEF COMPLAINT:   Left sided  Chest pain and SOB   HISTORY OF PRESENT ILLNESS  Kathryn Garrett is a 70 y.o. female who presents with Shortness of breath and left-sided pleurisy for 1 week -Patient states that she was seen in outpatient clinic a couple weeks ago and diagnosed with bronchitis.  -she had associated  fevers and generalized weakness and progressive SOB -She was given azithromycin at that time. She subsequently developed a rash and her antibiotic was changed to amoxicillin. -last several days she's been feeling significantly worse PTA.  -CT scan confirming pleural effusion unknown etiology s/p thoracentesis 700 cc's of amber fluid removed  Patient states that she feels better, but slight SOB    PAST MEDICAL HISTORY   Past Medical History  Diagnosis Date  . MI (myocardial infarction) ([place])   . Arthritis   . CHF (congestive heart failure) ([place])   . Hypertension   . CAD (coronary artery disease)      SURGICAL HISTORY   Past Surgical History  Procedure Laterality Date  . Abdominal hysterectomy       FAMILY HISTORY   Family History  Problem Relation Age of Onset  . Cancer Brother   . Coronary artery disease Father   . Coronary artery disease Mother      SOCIAL  HISTORY   Social History  Substance Use Topics  . Smoking status: Current Every Day Smoker  . Smokeless tobacco: None  . Alcohol Use: No     MEDICATIONS    Home Medication:  No current outpatient prescriptions on file.  Current Medication:  Current facility-administered medications:  .  acetaminophen (TYLENOL) tablet 650 mg, 650 mg, Oral, Q6H PRN, 650 mg at 09/27/15 0841 **OR** acetaminophen (TYLENOL) suppository 650 mg, 650 mg, Rectal, Q6H PRN, Lance Coon, MD .  docusate sodium (COLACE) capsule 100 mg, 100 mg, Oral, BID, Theodoro Grist, MD .  enoxaparin (LOVENOX) injection 40 mg, 40 mg, Subcutaneous, Q24H, Lance Coon, MD, 40 mg at 09/26/15 2111 .  metoprolol tartrate (LOPRESSOR) tablet 12.5 mg, 12.5 mg, Oral, BID, Theodoro Grist, MD .  ondansetron (ZOFRAN) tablet 4 mg, 4 mg, Oral, Q6H PRN **OR** ondansetron (ZOFRAN) injection 4 mg, 4 mg, Intravenous, Q6H PRN, Lance Coon, MD .  oxyCODONE (Oxy IR/ROXICODONE) immediate release tablet 5 mg, 5 mg, Oral, Q4H PRN, Lance Coon, MD, 5 mg at 09/27/15 1336 .  senna (SENOKOT) tablet 8.6 mg, 1 tablet, Oral, Daily, Theodoro Grist, MD .  sodium chloride flush (NS) 0.9 % injection 3 mL, 3 mL, Intravenous, Q12H, Lance Coon,  MD, 3 mL at 09/27/15 0848    ALLERGIES   Azithromycin     REVIEW OF SYSTEMS   Review of Systems  Constitutional: Positive for fever, chills and malaise/fatigue. Negative for weight loss and diaphoresis.  HENT: Negative for congestion and hearing loss.   Eyes: Negative for blurred vision and double vision.  Respiratory: Positive for cough and shortness of breath. Negative for wheezing.   Cardiovascular: Negative for chest pain, palpitations, orthopnea and leg swelling.  Gastrointestinal: Negative for heartburn, nausea, vomiting and abdominal pain.  Genitourinary: Negative for dysuria and urgency.  Musculoskeletal: Negative for myalgias.  Skin: Negative for rash.  Neurological: Negative for dizziness, weakness  and headaches.  Endo/Heme/Allergies: Does not bruise/bleed easily.  Psychiatric/Behavioral: The patient is nervous/anxious.   All other systems reviewed and are negative.    VS: BP 116/48 mmHg  Pulse 88  Temp(Src) 98.3 F (36.8 C) (Oral)  Resp 18  Ht 4\' 11"  (1.499 m)  Wt 136 lb 9.6 oz (61.961 kg)  BMI 27.57 kg/m2  SpO2 97%     PHYSICAL EXAM  Physical Exam  Constitutional: She is oriented to person, place, and time. She appears well-developed and well-nourished. No distress.  HENT:  Head: Normocephalic and atraumatic.  Mouth/Throat: No oropharyngeal exudate.  Eyes: EOM are normal. Pupils are equal, round, and reactive to light. No scleral icterus.  Neck: Normal range of motion. Neck supple.  Cardiovascular: Normal rate, regular rhythm and normal heart sounds.   No murmur heard. Pulmonary/Chest: No stridor. No respiratory distress. She has no wheezes.  Abdominal: Soft. Bowel sounds are normal. She exhibits distension. There is no tenderness. There is no rebound.  Musculoskeletal: Normal range of motion. She exhibits no edema.  Neurological: She is alert and oriented to person, place, and time. No cranial nerve deficit.  Skin: Skin is warm. She is not diaphoretic.  Psychiatric: She has a normal mood and affect.        LABS    Recent Labs     09/24/15  1807  09/25/15  0438  HGB  13.7  12.2  HCT  41.8  37.0  MCV  95.4  94.4  WBC  14.0*  12.1*  BUN  20  15  CREATININE  0.65  0.66  GLUCOSE  92  119*  CALCIUM  9.1  8.8*  ,      CULTURE RESULTS   Recent Results (from the past 240 hour(s))  Blood culture (routine x 2)     Status: None (Preliminary result)   Collection Time: 09/24/15 10:09 PM  Result Value Ref Range Status   Specimen Description BLOOD RIGHT ASSIST CONTROL  Final   Special Requests   Final    BOTTLES DRAWN AEROBIC AND ANAEROBIC 14CCAERO,15CCANA   Culture NO GROWTH 3 DAYS  Final   Report Status PENDING  Incomplete  Blood culture (routine x 2)      Status: None (Preliminary result)   Collection Time: 09/24/15 10:09 PM  Result Value Ref Range Status   Specimen Description BLOOD LEFT HAND  Final   Special Requests BOTTLES DRAWN AEROBIC AND ANAEROBIC Daniel  Final   Culture NO GROWTH 3 DAYS  Final   Report Status PENDING  Incomplete  Culture, body fluid-bottle     Status: None (Preliminary result)   Collection Time: 09/25/15  2:00 PM  Result Value Ref Range Status   Specimen Description PLEURAL  Final   Special Requests NONE  Final   Gram Stain   Final  WBC SEEN WBC PRESENT,BOTH PMN AND MONONUCLEAR NO ORGANISMS SEEN CYTOSPIN SMEAR    Culture NO GROWTH 2 DAYS  Final   Report Status PENDING  Incomplete          IMAGING    Dg Chest 1 View  09/25/2015  CLINICAL DATA:  Status post left thoracentesis, left pleural effusion, shortness of breath EXAM: CHEST 1 VIEW COMPARISON:  09/24/2015 FINDINGS: Slight decrease in the left effusion following left thoracentesis removing 700 cc pleural fluid. Minimal improvement in the lingular aeration. No pneumothorax. Right lung remains clear. Normal heart size and vascularity. Trachea midline. Atherosclerosis of the aorta evident. Degenerative changes of the spine. IMPRESSION: Slight improvement in the left effusion following thoracentesis yielding 700 cc. No complicating feature or pneumothorax. Electronically Signed   By: Jerilynn Mages.  Shick M.D.   On: 09/25/2015 14:31   Dg Chest 2 View  09/24/2015  CLINICAL DATA:  Left side pain with deep breathing starting Wednesday EXAM: CHEST  2 VIEW COMPARISON:  01/27/2014 FINDINGS: Cardiomediastinal silhouette is stable. There is moderate size left pleural effusion with left lower lobe atelectasis or infiltrate. Right lung is clear. No pulmonary edema. IMPRESSION: Moderate left pleural effusion with left lower lobe atelectasis or infiltrate. No pulmonary edema. Electronically Signed   By: Lahoma Crocker M.D.   On: 09/24/2015 18:54   Ct Angio Chest Pe W/cm &/or  Wo Cm  09/24/2015  CLINICAL DATA:  Reports left side pain with deep breathing. Seen at [facility] and dx with plural effusion. No resp distress EXAM: CT ANGIOGRAPHY CHEST WITH CONTRAST TECHNIQUE: Multidetector CT imaging of the chest was performed using the standard protocol during bolus administration of intravenous contrast. Multiplanar CT image reconstructions and MIPs were obtained to evaluate the vascular anatomy. CONTRAST:  100 cc Isovue 370 COMPARISON:  01/27/2014 FINDINGS: Heart:  Heart size is normal.  There is trace pericardial effusion. Vascular structures: There is atherosclerotic calcification of the thoracic aorta. No aneurysm. Note is made of a discrete origin for the left vertebral artery from the aortic arch. The pulmonary arteries are well opacified by contrast bolus. No acute pulmonary embolus. Mediastinum/thyroid: The visualized portion of the thyroid gland has a normal appearance. Esophagus has a normal appearance. There are small mediastinal lymph nodes, measuring 1.0 cm in the prevascular region, 1.0 cm in the pre carinal region, 1.3 cm in the left infrahilar region. Lungs/Airways: There is a moderate left pleural effusion. In the right upper lobe there is a 7 x 6 mm nodule (7 mm mean diameter) on image 35 of series 6. Appearance is unchanged since previous chest CT from 2014. Mild emphysematous changes are present. There is smooth septal thickening of the lungs bilaterally. Left lower lobe consolidation is present. Upper abdomen: Gallbladder is present. Benign left adrenal lesion is fatty attenuation consistent with myelolipoma measuring 3.3 cm. Chest wall/osseous structures: Degenerative changes are seen in the spine. No suspicious lytic or blastic lesions are identified. Review of the MIP images confirms the above findings. IMPRESSION: 1. Moderate left pleural effusion and left lower lobe consolidation, favored to be benign. However, follow-up is recommended to document clearing. 2. Stable  appearance of right upper lobe 7 mm nodule. Given the long-term stability, this process is benign and no specific follow-up is necessary. 3. Small mediastinal lymph nodes may be reactive. 4. Trace pericardial effusion. Electronically Signed   By: Nolon Nations M.D.   On: 09/24/2015 20:12   Dg Chest Port 1 View  09/26/2015  CLINICAL DATA:  Follow-up pleural  effusion.  Thoracentesis yesterday EXAM: PORTABLE CHEST 1 VIEW COMPARISON:  09/25/2015 FINDINGS: Interval increase in left pleural effusion which is now moderate in size. Compressive atelectasis left lower lobe. No pneumothorax Right lung clear. IMPRESSION: Progression of left pleural effusion since yesterday with increase in left lower lobe atelectasis. Electronically Signed   By: Franchot Gallo M.D.   On: 09/26/2015 16:26   US Thoracentesis Asp Pleural Space W/img Guide  09/25/2015  INDICATION: Large left pleural effusion and left lower lobe collapse. Shortness of breath. EXAM: ULTRASOUND GUIDED LEFT THORACENTESIS MEDICATIONS: 1% lidocaine locally COMPLICATIONS: None immediate. PROCEDURE: An ultrasound guided thoracentesis was thoroughly discussed with the patient and questions answered. The benefits, risks, alternatives and complications were also discussed. The patient understands and wishes to proceed with the procedure. Written consent was obtained. Ultrasound was performed to localize and mark an adequate pocket of fluid in the left chest. The area was then prepped and draped in the normal sterile fashion. 1% Lidocaine was used for local anesthesia. Under ultrasound guidance a 6 Fr Safe-T-Centesis catheter was introduced. Thoracentesis was performed. The catheter was removed and a dressing applied. FINDINGS: A total of approximately 700 cc of clear pleural fluid was removed. Samples were sent to the laboratory as requested by the clinical team. IMPRESSION: Successful ultrasound guided left thoracentesis yielding 700 cc of pleural fluid.  Electronically Signed   By: Jerilynn Mages.  Shick M.D.   On: 09/25/2015 14:28      ASSESSMENT/PLAN   70 yo white female with extensive smoking history, admitted for increased WOB/SOB and fevers with large left sided pleural effusion c/w exudative effusion. Possible empyema and malignant effusion  1.check ECHO 2.check US liver 3.Dr. Genevive Bi with CT surgery to perform thoracoscopy on Wednesday    I have personally obtained a history, examined the patient, evaluated laboratory and independently reviewed imaging results, formulated the assessment and plan and placed orders.  The Patient requires high complexity decision making for assessment and support, frequent evaluation and titration of therapies, application of advanced monitoring technologies and extensive interpretation of multiple databases.   Patient  satisfied with Plan of action and management. All questions answered  Corrin Parker, M.D.  [facility]  Medical Director [place] Director [facility] Cardio-Pulmonary Department

## 2015-09-27 NOTE — Progress Notes (Signed)
[place] [facility] [place] NAME: Kathryn Garrett    MR#:  [id number]  DATE OF BIRTH:  1945-08-22  SUBJECTIVE:  CHIEF COMPLAINT:   Chief Complaint  Patient presents with  . Pleurisy  Patient is 70 year old Caucasian female with past history significant for history of coronary artery disease, CHF, hypertension, who presents to the hospital with complaints of shortness of breath, left-sided chest pains. Apparently patient was diagnosed with bronchitis couple weeks ago, given azithromycin, however, over the past few days Prior to coming to the hospital she's been feeling poorly, having chills, nonproductive cough, left-sided chest pain. She was seen in emergency room, where she was noted to be tachycardic, had leukocytosis on lab studies, CT scan revealed pleural effusion. Patient was admitted for therapy due to concerns of empyema. The patient underwent thoracentesis 09/26/2015, pain has improved somewhat, less shortness of breath. Pleural fluid studies revealed elevated white blood cell count, LDH, but normal glucose level. Cultures did not show an any growth so far.. Cytology of pleural fluid is pending. Patient developed significant left-sided chest pain yesterday, urgent chest x-ray showed worsening pleural effusion. Patient was seen by Dr. Genevive Bi today for possible VATS procedure.  Review of Systems  Constitutional: Positive for malaise/fatigue. Negative for fever, chills and weight loss.  HENT: Negative for congestion.   Eyes: Negative for blurred vision and double vision.  Respiratory: Positive for cough. Negative for sputum production, shortness of breath and wheezing.   Cardiovascular: Positive for chest pain. Negative for palpitations, orthopnea, leg swelling and PND.  Gastrointestinal: Negative for nausea, vomiting, abdominal pain, diarrhea, constipation and blood in stool.  Genitourinary: Negative for dysuria, urgency, frequency and hematuria.   Musculoskeletal: Negative for falls.  Neurological: Negative for dizziness, tremors, focal weakness and headaches.  Endo/Heme/Allergies: Does not bruise/bleed easily.  Psychiatric/Behavioral: Negative for depression. The patient does not have insomnia.     VITAL SIGNS: Blood pressure 104/42, pulse 79, temperature 98.5 F (36.9 C), temperature source Axillary, resp. rate 18, height 4\' 11"  (1.499 m), weight 61.961 kg (136 lb 9.6 oz), SpO2 95 %.  PHYSICAL EXAMINATION:   GENERAL:  70 y.o.-year-old patient lying in the bed in mild distress, more comfortable today, up and eating her breakfast  EYES: Pupils equal, round, reactive to light and accommodation. No scleral icterus. Extraocular muscles intact.  HEENT: Head atraumatic, normocephalic. Oropharynx and nasopharynx clear.  NECK:  Supple, no jugular venous distention. No thyroid enlargement, no tenderness.  LUNGS: Diminished breath sounds on the left at the base, dullness to percussion in the lower part of the lung,  normal air entrance on the right, no wheezing, rales,rhonchi or crepitation. No use of accessory muscles of respiration.  CARDIOVASCULAR: S1, S2 normal. No murmurs, rubs, or gallops.  ABDOMEN: Soft, nontender, nondistended. Bowel sounds present. No organomegaly or mass.  EXTREMITIES: No pedal edema, cyanosis, or clubbing.  NEUROLOGIC: Cranial nerves II through XII are intact. Muscle strength 5/5 in all extremities. Sensation intact. Gait not checked.  PSYCHIATRIC: The patient is alert and oriented x 3.  SKIN: No obvious rash, lesion, or ulcer.   ORDERS/RESULTS REVIEWED:   CBC  Recent Labs Lab 09/24/15 1807 09/25/15 0438  WBC 14.0* 12.1*  HGB 13.7 12.2  HCT 41.8 37.0  PLT 356 340  MCV 95.4 94.4  MCH 31.3 31.2  MCHC 32.8 33.1  RDW 13.4 13.2   ------------------------------------------------------------------------------------------------------------------  Chemistries   Recent Labs Lab 09/24/15 1807  09/25/15 0438  NA 140 139  K 4.3 4.0  CL 109 110  CO2 20* 26  GLUCOSE 92 119*  BUN 20 15  CREATININE 0.65 0.66  CALCIUM 9.1 8.8*  AST 19 14*  ALT 12* 11*  ALKPHOS 73 70  BILITOT 0.3 0.3   ------------------------------------------------------------------------------------------------------------------ estimated creatinine clearance is 52.4 mL/min (by C-G formula based on Cr of 0.66). ------------------------------------------------------------------------------------------------------------------ No results for input(s): TSH, T4TOTAL, T3FREE, THYROIDAB in the last 72 hours.  Invalid input(s): FREET3  Cardiac Enzymes  Recent Labs Lab 09/24/15 1807  TROPONINI <0.03   ------------------------------------------------------------------------------------------------------------------ Invalid input(s): POCBNP ---------------------------------------------------------------------------------------------------------------  RADIOLOGY: Dg Chest 1 View  09/25/2015  CLINICAL DATA:  Status post left thoracentesis, left pleural effusion, shortness of breath EXAM: CHEST 1 VIEW COMPARISON:  09/24/2015 FINDINGS: Slight decrease in the left effusion following left thoracentesis removing 700 cc pleural fluid. Minimal improvement in the lingular aeration. No pneumothorax. Right lung remains clear. Normal heart size and vascularity. Trachea midline. Atherosclerosis of the aorta evident. Degenerative changes of the spine. IMPRESSION: Slight improvement in the left effusion following thoracentesis yielding 700 cc. No complicating feature or pneumothorax. Electronically Signed   By: Jerilynn Mages.  Shick M.D.   On: 09/25/2015 14:31   Dg Chest Port 1 View  09/26/2015  CLINICAL DATA:  Follow-up pleural effusion.  Thoracentesis yesterday EXAM: PORTABLE CHEST 1 VIEW COMPARISON:  09/25/2015 FINDINGS: Interval increase in left pleural effusion which is now moderate in size. Compressive atelectasis left lower lobe. No  pneumothorax Right lung clear. IMPRESSION: Progression of left pleural effusion since yesterday with increase in left lower lobe atelectasis. Electronically Signed   By: Franchot Gallo M.D.   On: 09/26/2015 16:26   US Thoracentesis Asp Pleural Space W/img Guide  09/25/2015  INDICATION: Large left pleural effusion and left lower lobe collapse. Shortness of breath. EXAM: ULTRASOUND GUIDED LEFT THORACENTESIS MEDICATIONS: 1% lidocaine locally COMPLICATIONS: None immediate. PROCEDURE: An ultrasound guided thoracentesis was thoroughly discussed with the patient and questions answered. The benefits, risks, alternatives and complications were also discussed. The patient understands and wishes to proceed with the procedure. Written consent was obtained. Ultrasound was performed to localize and mark an adequate pocket of fluid in the left chest. The area was then prepped and draped in the normal sterile fashion. 1% Lidocaine was used for local anesthesia. Under ultrasound guidance a 6 Fr Safe-T-Centesis catheter was introduced. Thoracentesis was performed. The catheter was removed and a dressing applied. FINDINGS: A total of approximately 700 cc of clear pleural fluid was removed. Samples were sent to the laboratory as requested by the clinical team. IMPRESSION: Successful ultrasound guided left thoracentesis yielding 700 cc of pleural fluid. Electronically Signed   By: Jerilynn Mages.  Shick M.D.   On: 09/25/2015 14:28    EKG:  Orders placed or performed during the hospital encounter of 09/24/15  . ED EKG  . ED EKG  . EKG 12-Lead  . EKG 12-Lead    ASSESSMENT AND PLAN:  Principal Problem:   Sepsis ([place]) Active Problems:   Pleural effusion on left   CAP (community acquired pneumonia)   CAD (coronary artery disease)   HTN (hypertension)  #1. SIRS , was felt due to Community-acquired pneumonia and parapneumonic effusion/empyema, discontinue antibiotics as cultures are negative, status post left thoracentesis  09/25/2015. Pleural fluid as well as Blood cultures are negative so far, sputum culture is pending. Pleural fluid cytology is pending, sent. Patient is not significantly clinically better, follow white blood cell count in the morning. Repeated chest x-ray showed some fluid reaccumulation. Patient is to  be seen by thoracic surgeon, Dr. Faith Rogue and will make decisions about possible VATS procedure. Continue supportive care. Discussed with DR Genevive Bi #2. Left-sided Pleural effusion, which was suspected due to pneumonia/parapneumonic effusion, status post left thoracentesis 09/25/2015,  discontinue antibiotics as cultures are negative, patient is going to be prepped for possible VATS procedure. Pleural fluid cytology is sent, pending results  #3. Metabolic acidosis, as based on basic metabolic panel on admission, no lactic acidosis, improved , following closely #4. Leukocytosis, improving with therapy,  recheck BMP tomorrow  #5. Essential hypertension, now patient is hypotensive, decrease metoprolol dose as patient's blood pressure is low  #6. Sinus tachycardia, resolved with IV fluid administration, supportive therapy, continue lower dose of metoprolol #7. Community acquired bacterial pneumonia ?, The patient was treated with broad-spectrum antibiotics, so far. Pleural fluid, blood cultures are negative  Management plans discussed with the patient, family and they are in agreement.   DRUG ALLERGIES:  Allergies  Allergen Reactions  . Azithromycin Rash    CODE STATUS:     Code Status Orders        Start     Ordered   09/24/15 2246  Full code   Continuous     09/24/15 2245    Code Status History    Date Active Date Inactive Code Status Order ID Comments User Context   This patient has a current code status but no historical code status.      TOTAL TIME TAKING CARE OF THIS PATIENT: 35 minutes.   Discussed with patient,DR Aram Beecham M.D on 09/27/2015 at 12:35 PM  Between 7am to 6pm  - Pager - [phone]  After 6pm go to [email or web address] - password [email or web address] [facility]  [place] [facility]  Office  [phone]  CC: Primary care physician; No primary care provider on file.

## 2015-09-27 NOTE — Progress Notes (Signed)
Patient ID: Kathryn Garrett, female   DOB: 01-22-1946, 70 y.o.   MRN: [id number]  Chief Complaint  Patient presents with  . Pleurisy    Referred By Dr. Aggie Moats Reason for Referral Recurrent left pleural effusion  HPI Location, Quality, Duration, Severity, Timing, Context, Modifying Factors, Associated Signs and Symptoms.  Kathryn Garrett is a 70 y.o. female.  She presented to her primary care physician with symptoms of congestion and cough. She also complained of some left-sided chest pain. She was treated with a Z-Pak and developed a rash to that. She was subsequently switched to amoxicillin. However she continued to have significant pain in her left side associated with some shortness of breath and she came in the emergency room where she was found to have a moderate size left pleural effusion. A ultrasound-guided thoracentesis was performed. This revealed 700 cc of clear fluid. The analysis was not consistent with an empyema. There is no known history of malignancy although the patient is a heavy smoker. She smoked most of her life at least 1-2 packs per day. She also has a very strong family history of 3 siblings and a parent with lung cancer. All of these people smoked. 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 She does not complain of any significant shortness of breath although her job does not require significant exertion. She states she's gained some weight as opposed to having any weight loss. She denied any hemoptysis.   Past Medical History  Diagnosis Date  . MI (myocardial infarction) ([place])   . Arthritis   . CHF (congestive heart failure) ([place])   . Hypertension   . CAD (coronary artery disease)     Past Surgical History  Procedure Laterality Date  . Abdominal hysterectomy      Family History  Problem Relation Age of Onset  . Cancer Brother   . Coronary artery disease Father   . Coronary artery disease Mother     Social History Social  History  Substance Use Topics  . Smoking status: Current Every Day Smoker  . Smokeless tobacco: None  . Alcohol Use: No    Allergies  Allergen Reactions  . Azithromycin Rash    Current Facility-Administered Medications  Medication Dose Route Frequency Provider Last Rate Last Dose  . acetaminophen (TYLENOL) tablet 650 mg  650 mg Oral Q6H PRN Lance Coon, MD   650 mg at 09/27/15 0841   Or  . acetaminophen (TYLENOL) suppository 650 mg  650 mg Rectal Q6H PRN Lance Coon, MD      . docusate sodium (COLACE) capsule 100 mg  100 mg Oral BID Theodoro Grist, MD   100 mg at 09/27/15 1521  . metoprolol tartrate (LOPRESSOR) tablet 12.5 mg  12.5 mg Oral BID Theodoro Grist, MD      . ondansetron (ZOFRAN) tablet 4 mg  4 mg Oral Q6H PRN Lance Coon, MD       Or  . ondansetron [facility]) injection 4 mg  4 mg Intravenous Q6H PRN Lance Coon, MD      . oxyCODONE (Oxy IR/ROXICODONE) immediate release tablet 5 mg  5 mg Oral Q4H PRN Lance Coon, MD   5 mg at 09/27/15 1336  . senna (SENOKOT) tablet 8.6 mg  1 tablet Oral Daily Theodoro Grist, MD   8.6 mg at 09/27/15 1521  . sodium chloride flush (NS) 0.9 % injection 3 mL  3 mL Intravenous Q12H Lance Coon, MD   3  mL at 09/27/15 0848      Review of Systems A complete review of systems was asked and was negative except for the following positive findingsSignificant chest pain prior to admission and after. She believes this may be related to her thoracentesis. In addition she complains of some cough and ankle swelling.  Blood pressure 116/48, pulse 88, temperature 98.3 F (36.8 C), temperature source Oral, resp. rate 18, height 4\' 11"  (1.499 m), weight 136 lb 9.6 oz (61.961 kg), SpO2 97 %.  Physical Exam CONSTITUTIONAL:  Pleasant, well-developed, well-nourished, and in no acute distress. EYES: Pupils equal and reactive to light, Sclera non-icteric EARS, NOSE, MOUTH AND THROAT:  The oropharynx was clear.  Dentition is poorrepair.  Oral mucosa pink and  moist. LYMPH NODES:  Lymph nodes in the neck and axillae were normal RESPIRATORY:  Lungs were clear on the right and diminished at the left base.  Normal respiratory effort without pathologic use of accessory muscles of respiration CARDIOVASCULAR: Heart was regular without murmurs.  There were no carotid bruits. GI: The abdomen was soft, nontender, and nondistended. There were no palpable masses. There was no hepatosplenomegaly. There were normal bowel sounds in all quadrants. GU:  Rectal deferred.   MUSCULOSKELETAL:  Normal muscle strength and tone.  No clubbing or cyanosis.   SKIN:  There were no pathologic skin lesions.  There were no nodules on palpation. NEUROLOGIC:  Sensation is normal.  Cranial nerves are grossly intact. PSYCH:  Oriented to person, place and time.  Mood and affect are normal.  Data Reviewed Chest x-ray and CT scan  I have personally reviewed the patient's imaging, laboratory findings and medical records.    Assessment    I have discussed her care with Dr. Stoney Bang. He believes that a diagnostic thoracoscopy would be indicated and I agree. Of also discussed her care with Dr. Aggie Moats and she is also in agreement. Despite the fact that the CT scan does not show any obvious mass on the left I am still concerned about a malignant pleural effusion.    Plan    I talked to the patient about the options. I explained to her the procedure of a preoperative bronchoscopy with left sided thoracoscopy pleural biopsy and talc pleurodesis. We also discussed the possible role Pleurx catheter insertion. She understood that there were risks associated with this procedure and she would like [place] to proceed. We will hold her Lovenox and Plavix. We will begin pneumatic compression hose. We will check an ultrasound and echocardiogram that been ordered by Dr. Stoney Bang.       [name], MD 09/27/2015, 3:54 PM

## 2015-09-27 NOTE — Progress Notes (Signed)
Paged attending MD r/t BP, awaiting response

## 2015-09-28 ENCOUNTER — Inpatient Hospital Stay: Payer: Medicare HMO

## 2015-09-28 DIAGNOSIS — J90 Pleural effusion, not elsewhere classified: Secondary | ICD-10-CM | POA: Insufficient documentation

## 2015-09-28 LAB — URINALYSIS COMPLETE WITH MICROSCOPIC (ARMC ONLY)
Bacteria, UA: NONE SEEN
Bilirubin Urine: NEGATIVE
Glucose, UA: NEGATIVE mg/dL
KETONES UR: NEGATIVE mg/dL
Leukocytes, UA: NEGATIVE
NITRITE: NEGATIVE
Protein, ur: NEGATIVE mg/dL
SPECIFIC GRAVITY, URINE: 1.011 (ref 1.005–1.030)
pH: 6 (ref 5.0–8.0)

## 2015-09-28 LAB — CBC
HCT: 36.5 % (ref 35.0–47.0)
HEMOGLOBIN: 12.4 g/dL (ref 12.0–16.0)
MCH: 31.3 pg (ref 26.0–34.0)
MCHC: 33.9 g/dL (ref 32.0–36.0)
MCV: 92.4 fL (ref 80.0–100.0)
Platelets: 353 10*3/uL (ref 150–440)
RBC: 3.95 MIL/uL (ref 3.80–5.20)
RDW: 13.1 % (ref 11.5–14.5)
WBC: 13.9 10*3/uL — AB (ref 3.6–11.0)

## 2015-09-28 LAB — COMPREHENSIVE METABOLIC PANEL
ALBUMIN: 2.9 g/dL — AB (ref 3.5–5.0)
ALK PHOS: 58 U/L (ref 38–126)
ALT: 11 U/L — AB (ref 14–54)
ANION GAP: 6 (ref 5–15)
AST: 15 U/L (ref 15–41)
BILIRUBIN TOTAL: 0.3 mg/dL (ref 0.3–1.2)
BUN: 15 mg/dL (ref 6–20)
CALCIUM: 8.2 mg/dL — AB (ref 8.9–10.3)
CO2: 27 mmol/L (ref 22–32)
CREATININE: 0.7 mg/dL (ref 0.44–1.00)
Chloride: 103 mmol/L (ref 101–111)
GFR calc non Af Amer: >60 mL/min (ref >60)
GLUCOSE: 133 mg/dL — AB (ref 65–99)
Potassium: 3.8 mmol/L (ref 3.5–5.1)
Sodium: 136 mmol/L (ref 135–145)
TOTAL PROTEIN: 6.2 g/dL — AB (ref 6.5–8.1)

## 2015-09-28 LAB — GLUCOSE, CAPILLARY: Glucose-Capillary: 133 mg/dL — ABNORMAL HIGH (ref 65–99)

## 2015-09-28 LAB — ECHOCARDIOGRAM COMPLETE
Height: 59 in
WEIGHTICAEL: 2185.6 [oz_av]

## 2015-09-28 LAB — PROCALCITONIN: Procalcitonin: <0.1 ng/mL

## 2015-09-28 MED ORDER — LEVOFLOXACIN IN D5W 500 MG/100ML IV SOLN
500.0000 mg | INTRAVENOUS | Status: DC
  Administered 2015-09-28 – 2015-09-30 (×4): 500 mg via INTRAVENOUS
  Filled 2015-09-28 (×5): qty 100

## 2015-09-28 NOTE — Progress Notes (Signed)
Kathryn Garrett Inpatient Post-Op Note  Patient ID: Kathryn Garrett, female   DOB: 11-25-1945, 70 y.o.   MRN: [id number]  HISTORY: She had an ultrasound of her abdomen and a echocardiogram performed. She has no new complaints today.   Filed Vitals:   09/28/15 0414 09/28/15 0955  BP: 101/43 106/38  Pulse: 96 90  Temp: 98.4 F (36.9 C)   Resp: 19      EXAM: Resp: Lungs are clear bilaterally.  No respiratory distress, normal effort. Heart:  Regular without murmurs Abd:  Abdomen is soft, non distended and non tender. No masses are palpable.  There is no rebound and no guarding.  Neurological: Alert and oriented to person, place, and time. Coordination normal.  Skin: Skin is warm and dry. No rash noted. No diaphoretic. No erythema. No pallor.  Psychiatric: Normal mood and affect. Normal behavior. Judgment and thought content normal.    ASSESSMENT: I have reviewed the results of the echo and ultrasound. I discussed her care with Dr. Ether Griffins.  The echocardiogram and ultrasound were sensing normal. The pleural fluid cytology was again normal.   PLAN:   I had a long discussion with the patient. I reviewed with her the options. I again discussed the role of bronchoscopy with thoracoscopy possible thoracotomy and talc pleurodesis with pleural biopsy. She understands and would like [place] to proceed.    [name], MD

## 2015-09-28 NOTE — Consult Note (Signed)
[facility]     Reason for Consult: sepsis3    Referring Physician:  Ether Griffins Date of Admission:  09/24/2015   Principal Problem:   Sepsis [facility]) Active Problems:   Pleural effusion on left   CAP (community acquired pneumonia)   CAD (coronary artery disease)   HTN (hypertension)   Pleural effusion   HPI: Kathryn Garrett is a 70 y.o. female admitted 5/12 with SOB and L sided pain. Had been dxed with bronchitis as otpt and treated with azitrho but developed a rash and changed to amoxicillin. She had on admit a CXR with L pleural effusion. WBC on admit was 14. BCX has been negative.  Had thoracentesis done 5/13 with fluid exudative by lights criteria, but not evidently empyema. Has been seen by Dr Genevive Bi   Past Medical History  Diagnosis Date  . MI (myocardial infarction) ([place])   . Arthritis   . CHF (congestive heart failure) ([place])   . Hypertension   . CAD (coronary artery disease)    Past Surgical History  Procedure Laterality Date  . Abdominal hysterectomy     Social History  Substance Use Topics  . Smoking status: Current Every Day Smoker  . Smokeless tobacco: None  . Alcohol Use: No   Family History  Problem Relation Age of Onset  . Cancer Brother   . Coronary artery disease Father   . Coronary artery disease Mother     Allergies:  Allergies  Allergen Reactions  . Azithromycin Rash    Current antibiotics: Antibiotics Given (last 72 hours)    Date/Time Action Medication Dose Rate   09/25/15 1800 Given   vancomycin (VANCOCIN) IVPB 750 mg/150 ml premix 750 mg 150 mL/hr   09/25/15 2106 Given   piperacillin-tazobactam (ZOSYN) IVPB 3.375 g 3.375 g 12.5 mL/hr   09/26/15 0515 Given   piperacillin-tazobactam (ZOSYN) IVPB 3.375 g 3.375 g 12.5 mL/hr   09/26/15 1242 Given   vancomycin (VANCOCIN) IVPB 750 mg/150 ml premix 750 mg 150 mL/hr   09/26/15 1730 Given  [rescheduled due to loss of IV access]   piperacillin-tazobactam (ZOSYN) IVPB 3.375 g  3.375 g 12.5 mL/hr   09/26/15 2111 Given   piperacillin-tazobactam (ZOSYN) IVPB 3.375 g 3.375 g 12.5 mL/hr   09/27/15 0502 Given   piperacillin-tazobactam (ZOSYN) IVPB 3.375 g 3.375 g 12.5 mL/hr   09/27/15 0503 Given   vancomycin (VANCOCIN) IVPB 750 mg/150 ml premix 750 mg 150 mL/hr      MEDICATIONS: . docusate sodium  100 mg Oral BID  . levofloxacin (LEVAQUIN) IV  500 mg Intravenous Q24H  . metoprolol tartrate  12.5 mg Oral BID  . senna  1 tablet Oral Daily  . sodium chloride flush  3 mL Intravenous Q12H    Review of Systems - 11 systems reviewed and negative per HPI   OBJECTIVE: Temp:  [98.4 F (36.9 C)-100.2 F (37.9 C)] 98.4 F (36.9 C) (05/16 0414) Pulse Rate:  [90-108] 90 (05/16 0955) Resp:  [18-19] 19 (05/16 0414) BP: (101-140)/(38-78) 106/38 mmHg (05/16 0955) SpO2:  [91 %-94 %] 91 % (05/16 0414) Physical Exam  Constitutional:  oriented to person, place, and time. appears well-developed and well-nourished. No distress.  HENT: [place]/AT, PERRLA, no scleral icterus Mouth/Throat: Oropharynx is clear and moist. No oropharyngeal exudate.  Cardiovascular: Normal rate, regular rhythm and normal heart sounds. Exam reveals no gallop and no friction rub.  No murmur heard.  Pulmonary/Chest: dec BSR base Neck = supple, no nuchal rigidity Abdominal: Soft. Bowel sounds  are normal.  exhibits no distension. There is no tenderness.  Lymphadenopathy: no cervical adenopathy. No axillary adenopathy Neurological: alert and oriented to person, place, and time.  Skin: Skin is warm and dry. No rash noted. No erythema.  Psychiatric: a normal mood and affect.  behavior is normal.    LABS: Results for orders placed or performed during the hospital encounter of 09/24/15 (from the past 48 hour(s))  CBC     Status: Abnormal   Collection Time: 09/28/15  3:36 AM  Result Value Ref Range   WBC 13.9 (H) 3.6 - 11.0 K/uL   RBC 3.95 3.80 - 5.20 MIL/uL   Hemoglobin 12.4 12.0 - 16.0 g/dL   HCT 36.5  35.0 - 47.0 %   MCV 92.4 80.0 - 100.0 fL   MCH 31.3 26.0 - 34.0 pg   MCHC 33.9 32.0 - 36.0 g/dL   RDW 13.1 11.5 - 14.5 %   Platelets 353 150 - 440 K/uL   No components found for: ESR, C REACTIVE PROTEIN MICRO: Recent Results (from the past 720 hour(s))  Blood culture (routine x 2)     Status: None (Preliminary result)   Collection Time: 09/24/15 10:09 PM  Result Value Ref Range Status   Specimen Description BLOOD RIGHT ASSIST CONTROL  Final   Special Requests   Final    BOTTLES DRAWN AEROBIC AND ANAEROBIC 14CCAERO,15CCANA   Culture NO GROWTH 4 DAYS  Final   Report Status PENDING  Incomplete  Blood culture (routine x 2)     Status: None (Preliminary result)   Collection Time: 09/24/15 10:09 PM  Result Value Ref Range Status   Specimen Description BLOOD LEFT HAND  Final   Special Requests BOTTLES DRAWN AEROBIC AND ANAEROBIC [place]  Final   Culture NO GROWTH 4 DAYS  Final   Report Status PENDING  Incomplete  Culture, body fluid-bottle     Status: None (Preliminary result)   Collection Time: 09/25/15  2:00 PM  Result Value Ref Range Status   Specimen Description PLEURAL  Final   Special Requests NONE  Final   Gram Stain   Final    WBC SEEN WBC PRESENT,BOTH PMN AND MONONUCLEAR NO ORGANISMS SEEN CYTOSPIN SMEAR    Culture NO GROWTH 3 DAYS  Final   Report Status PENDING  Incomplete    IMAGING: Dg Chest 1 View  09/25/2015  CLINICAL DATA:  Status post left thoracentesis, left pleural effusion, shortness of breath EXAM: CHEST 1 VIEW COMPARISON:  09/24/2015 FINDINGS: Slight decrease in the left effusion following left thoracentesis removing 700 cc pleural fluid. Minimal improvement in the lingular aeration. No pneumothorax. Right lung remains clear. Normal heart size and vascularity. Trachea midline. Atherosclerosis of the aorta evident. Degenerative changes of the spine. IMPRESSION: Slight improvement in the left effusion following thoracentesis yielding 700 cc. No complicating  feature or pneumothorax. Electronically Signed   By: Jerilynn Mages.  Shick M.D.   On: 09/25/2015 14:31   Dg Chest 2 View  09/24/2015  CLINICAL DATA:  Left side pain with deep breathing starting Wednesday EXAM: CHEST  2 VIEW COMPARISON:  01/27/2014 FINDINGS: Cardiomediastinal silhouette is stable. There is moderate size left pleural effusion with left lower lobe atelectasis or infiltrate. Right lung is clear. No pulmonary edema. IMPRESSION: Moderate left pleural effusion with left lower lobe atelectasis or infiltrate. No pulmonary edema. Electronically Signed   By: Lahoma Crocker M.D.   On: 09/24/2015 18:54   Ct Angio Chest Pe W/cm &/or Wo Cm  09/24/2015  CLINICAL  DATA:  Reports left side pain with deep breathing. Seen at [facility] and dx with plural effusion. No resp distress EXAM: CT ANGIOGRAPHY CHEST WITH CONTRAST TECHNIQUE: Multidetector CT imaging of the chest was performed using the standard protocol during bolus administration of intravenous contrast. Multiplanar CT image reconstructions and MIPs were obtained to evaluate the vascular anatomy. CONTRAST:  100 cc Isovue 370 COMPARISON:  01/27/2014 FINDINGS: Heart:  Heart size is normal.  There is trace pericardial effusion. Vascular structures: There is atherosclerotic calcification of the thoracic aorta. No aneurysm. Note is made of a discrete origin for the left vertebral artery from the aortic arch. The pulmonary arteries are well opacified by contrast bolus. No acute pulmonary embolus. Mediastinum/thyroid: The visualized portion of the thyroid gland has a normal appearance. Esophagus has a normal appearance. There are small mediastinal lymph nodes, measuring 1.0 cm in the prevascular region, 1.0 cm in the pre carinal region, 1.3 cm in the left infrahilar region. Lungs/Airways: There is a moderate left pleural effusion. In the right upper lobe there is a 7 x 6 mm nodule (7 mm mean diameter) on image 35 of series 6. Appearance is unchanged since previous chest CT from 2014.  Mild emphysematous changes are present. There is smooth septal thickening of the lungs bilaterally. Left lower lobe consolidation is present. Upper abdomen: Gallbladder is present. Benign left adrenal lesion is fatty attenuation consistent with myelolipoma measuring 3.3 cm. Chest wall/osseous structures: Degenerative changes are seen in the spine. No suspicious lytic or blastic lesions are identified. Review of the MIP images confirms the above findings. IMPRESSION: 1. Moderate left pleural effusion and left lower lobe consolidation, favored to be benign. However, follow-up is recommended to document clearing. 2. Stable appearance of right upper lobe 7 mm nodule. Given the long-term stability, this process is benign and no specific follow-up is necessary. 3. Small mediastinal lymph nodes may be reactive. 4. Trace pericardial effusion. Electronically Signed   By: Nolon Nations M.D.   On: 09/24/2015 20:12   US Abdomen Complete  09/28/2015  CLINICAL DATA:  Left upper quadrant abdominal pain. Shortness of breath. Clinical concern for ascites. EXAM: ABDOMEN ULTRASOUND COMPLETE COMPARISON:  None. FINDINGS: Gallbladder: No gallstones or wall thickening visualized. No sonographic Murphy sign noted by sonographer. Common bile duct: Diameter: 5 mm Liver: Liver parenchymal echogenicity is at the upper limits of normal. Liver parenchymal echotexture is within normal limits. No definite liver surface irregularity. No liver mass demonstrated. IVC: No abnormality visualized. Pancreas: Visualized portion unremarkable. Spleen: Size and appearance within normal limits. Right Kidney: Length: 10.6 cm. Echogenicity within normal limits. No mass or hydronephrosis visualized. Left Kidney: Length: 11.0 cm. Echogenicity within normal limits. No mass or hydronephrosis visualized. Abdominal aorta: No aneurysm visualized. Other findings: Left pleural effusion is incidentally noted. No ascites. IMPRESSION: 1. Normal abdominal sonogram. No  ascites. Liver appears normal. Normal size spleen. 2. Incidental left pleural effusion. Electronically Signed   By: Ilona Sorrel M.D.   On: 09/28/2015 09:02   Dg Chest Port 1 View  09/27/2015  CLINICAL DATA:  C/o pleural effusion; coronary artery disease, CHF, hypertension; recent diagnosis bronchitis couple weeks ago, given azithromycin, however, over the past few days Prior to coming to the hospital she's been feeling poorly, having chills, nonproductive cough, left-sided chest pain. Patient was admitted for therapy due to concerns of empyema. The patient underwent thoracentesis 09/26/15 EXAM: PORTABLE CHEST 1 VIEW COMPARISON:  09/26/2015 FINDINGS: No pneumothorax. Moderate left pleural effusion obscures the left hemidiaphragm and most  of the left heart border. No right pleural effusion.  No pulmonary edema. Heart, mediastinum and hila are unremarkable. IMPRESSION: 1. Moderate left pleural effusion is similar to the previous day's study. 2. No pneumothorax. Electronically Signed   By: Lajean Manes M.D.   On: 09/27/2015 14:11   Dg Chest Port 1 View  09/26/2015  CLINICAL DATA:  Follow-up pleural effusion.  Thoracentesis yesterday EXAM: PORTABLE CHEST 1 VIEW COMPARISON:  09/25/2015 FINDINGS: Interval increase in left pleural effusion which is now moderate in size. Compressive atelectasis left lower lobe. No pneumothorax Right lung clear. IMPRESSION: Progression of left pleural effusion since yesterday with increase in left lower lobe atelectasis. Electronically Signed   By: Franchot Gallo M.D.   On: 09/26/2015 16:26   US Thoracentesis Asp Pleural Space W/img Guide  09/25/2015  INDICATION: Large left pleural effusion and left lower lobe collapse. Shortness of breath. EXAM: ULTRASOUND GUIDED LEFT THORACENTESIS MEDICATIONS: 1% lidocaine locally COMPLICATIONS: None immediate. PROCEDURE: An ultrasound guided thoracentesis was thoroughly discussed with the patient and questions answered. The benefits, risks,  alternatives and complications were also discussed. The patient understands and wishes to proceed with the procedure. Written consent was obtained. Ultrasound was performed to localize and mark an adequate pocket of fluid in the left chest. The area was then prepped and draped in the normal sterile fashion. 1% Lidocaine was used for local anesthesia. Under ultrasound guidance a 6 Fr Safe-T-Centesis catheter was introduced. Thoracentesis was performed. The catheter was removed and a dressing applied. FINDINGS: A total of approximately 700 cc of clear pleural fluid was removed. Samples were sent to the laboratory as requested by the clinical team. IMPRESSION: Successful ultrasound guided left thoracentesis yielding 700 cc of pleural fluid. Electronically Signed   By: Jerilynn Mages.  Shick M.D.   On: 09/25/2015 14:28    Assessment:   Kathryn Garrett is a 70 y.o. female with what seems to be a para pneumonic effusion and focal consolidation following a resp illness. Cultures are negative but still wti low grad fever and elevated wbc. Has received 3 days azitrho and a few of amoxicillin  Recommendations Add levo for a 7 day course Agree with CT surgery management of the effusion Thank you very much for allowing me to participate in the care of this patient. Please call with questions.   Cheral Marker. Ola Spurr, MD

## 2015-09-28 NOTE — Progress Notes (Signed)
[place] [facility] [place] NAME: Kathryn Garrett    MR#:  [id number]  DATE OF BIRTH:  [birth year]  SUBJECTIVE:  CHIEF COMPLAINT:   Chief Complaint  Patient presents with  . Pleurisy  Patient is 70 year old Caucasian female with past history significant for history of coronary artery disease, CHF, hypertension, who presents to the hospital with complaints of shortness of breath, left-sided chest pains. Apparently patient was diagnosed with bronchitis couple weeks ago, given azithromycin, however, over the past few days Prior to coming to the hospital she's been feeling poorly, having chills, nonproductive cough, left-sided chest pain. She was seen in emergency room, where she was noted to be tachycardic, had leukocytosis on lab studies, CT scan revealed pleural effusion. Patient was admitted for therapy due to concerns of empyema. The patient underwent thoracentesis 09/26/2015, pain has improved somewhat, less shortness of breath. Pleural fluid studies revealed elevated white blood cell count, LDH, but normal glucose level. Cultures did not show an any growth so far.. Cytology of pleural fluid is pending. Patient Is comfortable today, she will undergo thoracoscopy procedure with pleural biopsy tomorrow, per Dr. Genevive Bi  Review of Systems  Constitutional: Positive for malaise/fatigue. Negative for fever, chills and weight loss.  HENT: Negative for congestion.   Eyes: Negative for blurred vision and double vision.  Respiratory: Positive for cough. Negative for sputum production, shortness of breath and wheezing.   Cardiovascular: Positive for chest pain. Negative for palpitations, orthopnea, leg swelling and PND.  Gastrointestinal: Negative for nausea, vomiting, abdominal pain, diarrhea, constipation and blood in stool.  Genitourinary: Negative for dysuria, urgency, frequency and hematuria.  Musculoskeletal: Negative for falls.  Neurological: Negative for  dizziness, tremors, focal weakness and headaches.  Endo/Heme/Allergies: Does not bruise/bleed easily.  Psychiatric/Behavioral: Negative for depression. The patient does not have insomnia.     VITAL SIGNS: Blood pressure 106/38, pulse 90, temperature 98.4 F (36.9 C), temperature source Oral, resp. rate 19, height 4\' 11"  (1.499 m), weight 61.961 kg (136 lb 9.6 oz), SpO2 91 %.  PHYSICAL EXAMINATION:   GENERAL:  70 y.o.-year-old patient lying in the bed in mild distress, more comfortable today, up and eating her breakfast  EYES: Pupils equal, round, reactive to light and accommodation. No scleral icterus. Extraocular muscles intact.  HEENT: Head atraumatic, normocephalic. Oropharynx and nasopharynx clear.  NECK:  Supple, no jugular venous distention. No thyroid enlargement, no tenderness.  LUNGS: Diminished breath sounds on the left at the base, dullness to percussion in the lower part of the lung,  normal air entrance on the right, no wheezing, rales,rhonchi or crepitation. No use of accessory muscles of respiration.  CARDIOVASCULAR: S1, S2 normal. No murmurs, rubs, or gallops.  ABDOMEN: Soft, nontender, nondistended. Bowel sounds present. No organomegaly or mass.  EXTREMITIES: No pedal edema, cyanosis, or clubbing.  NEUROLOGIC: Cranial nerves II through XII are intact. Muscle strength 5/5 in all extremities. Sensation intact. Gait not checked.  PSYCHIATRIC: The patient is alert and oriented x 3.  SKIN: No obvious rash, lesion, or ulcer.   ORDERS/RESULTS REVIEWED:   CBC  Recent Labs Lab 09/24/15 1807 09/25/15 0438 09/28/15 0336  WBC 14.0* 12.1* 13.9*  HGB 13.7 12.2 12.4  HCT 41.8 37.0 36.5  PLT 356 340 353  MCV 95.4 94.4 92.4  MCH 31.3 31.2 31.3  MCHC 32.8 33.1 33.9  RDW 13.4 13.2 13.1   ------------------------------------------------------------------------------------------------------------------  Chemistries   Recent Labs Lab 09/24/15 1807 09/25/15 0438  NA 140 139  K 4.3 4.0  CL 109 110  CO2 20* 26  GLUCOSE 92 119*  BUN 20 15  CREATININE 0.65 0.66  CALCIUM 9.1 8.8*  AST 19 14*  ALT 12* 11*  ALKPHOS 73 70  BILITOT 0.3 0.3   ------------------------------------------------------------------------------------------------------------------ estimated creatinine clearance is 52.4 mL/min (by C-G formula based on Cr of 0.66). ------------------------------------------------------------------------------------------------------------------ No results for input(s): TSH, T4TOTAL, T3FREE, THYROIDAB in the last 72 hours.  Invalid input(s): FREET3  Cardiac Enzymes  Recent Labs Lab 09/24/15 1807  TROPONINI <0.03   ------------------------------------------------------------------------------------------------------------------ Invalid input(s): POCBNP ---------------------------------------------------------------------------------------------------------------  RADIOLOGY: US Abdomen Complete  09/28/2015  CLINICAL DATA:  Left upper quadrant abdominal pain. Shortness of breath. Clinical concern for ascites. EXAM: ABDOMEN ULTRASOUND COMPLETE COMPARISON:  None. FINDINGS: Gallbladder: No gallstones or wall thickening visualized. No sonographic Murphy sign noted by sonographer. Common bile duct: Diameter: 5 mm Liver: Liver parenchymal echogenicity is at the upper limits of normal. Liver parenchymal echotexture is within normal limits. No definite liver surface irregularity. No liver mass demonstrated. IVC: No abnormality visualized. Pancreas: Visualized portion unremarkable. Spleen: Size and appearance within normal limits. Right Kidney: Length: 10.6 cm. Echogenicity within normal limits. No mass or hydronephrosis visualized. Left Kidney: Length: 11.0 cm. Echogenicity within normal limits. No mass or hydronephrosis visualized. Abdominal aorta: No aneurysm visualized. Other findings: Left pleural effusion is incidentally noted. No ascites. IMPRESSION: 1. Normal  abdominal sonogram. No ascites. Liver appears normal. Normal size spleen. 2. Incidental left pleural effusion. Electronically Signed   By: Ilona Sorrel M.D.   On: 09/28/2015 09:02   Dg Chest Port 1 View  09/27/2015  CLINICAL DATA:  C/o pleural effusion; coronary artery disease, CHF, hypertension; recent diagnosis bronchitis couple weeks ago, given azithromycin, however, over the past few days Prior to coming to the hospital she's been feeling poorly, having chills, nonproductive cough, left-sided chest pain. Patient was admitted for therapy due to concerns of empyema. The patient underwent thoracentesis 09/26/15 EXAM: PORTABLE CHEST 1 VIEW COMPARISON:  09/26/2015 FINDINGS: No pneumothorax. Moderate left pleural effusion obscures the left hemidiaphragm and most of the left heart border. No right pleural effusion.  No pulmonary edema. Heart, mediastinum and hila are unremarkable. IMPRESSION: 1. Moderate left pleural effusion is similar to the previous day's study. 2. No pneumothorax. Electronically Signed   By: Lajean Manes M.D.   On: 09/27/2015 14:11   Dg Chest Port 1 View  09/26/2015  CLINICAL DATA:  Follow-up pleural effusion.  Thoracentesis yesterday EXAM: PORTABLE CHEST 1 VIEW COMPARISON:  09/25/2015 FINDINGS: Interval increase in left pleural effusion which is now moderate in size. Compressive atelectasis left lower lobe. No pneumothorax Right lung clear. IMPRESSION: Progression of left pleural effusion since yesterday with increase in left lower lobe atelectasis. Electronically Signed   By: Franchot Gallo M.D.   On: 09/26/2015 16:26    EKG:  Orders placed or performed during the hospital encounter of 09/24/15  . ED EKG  . ED EKG  . EKG 12-Lead  . EKG 12-Lead    ASSESSMENT AND PLAN:  Principal Problem:   Sepsis ([place]) Active Problems:   Pleural effusion on left   CAP (community acquired pneumonia)   CAD (coronary artery disease)   HTN (hypertension)  #1. SIRS , was felt due to  Community-acquired pneumonia and parapneumonic effusion/empyema, Now off antibiotics as cultures were negative, status post left thoracentesis 09/25/2015. Pleural fluid as well as Blood cultures are negative so far, sputum culture is pending. Pleural fluid cytology is pending, sent. White blood cell  count today is up from 12.1 to 13.9 today . Repeated chest x-ray showed some fluid reaccumulation. Patient wasn't seen by thoracic surgeon, Dr. Genevive Bi, patient will be going to thoracoscopy procedure tomorrow with pleural biopsy. Continue supportive care. Appreciate pulmonary input. Discussed with DR Genevive Bi. Get infectious disease specialist consultation as well #2. Left-sided Pleural effusion, which was suspected due to pneumonia/parapneumonic effusion, status post left thoracentesis 09/25/2015,   cultures are negative, patient's older cell count is slightly up, her temperature is also up, questionable reinitiation on antibiotics,  patient is going to be prepped for thoracoscopy procedure. Pleural fluid cytology is sent, pending results  #3. Metabolic acidosis, as based on basic metabolic panel on admission, no lactic acidosis, improved , following closely #4. Leukocytosis, worse today, off antibiotic therapy, awaiting for infectious disease specialist input  #5. Essential hypertension, now patient is hypotensive, now on decreased metoprolol dose as patient's blood pressure was low  #6. Sinus tachycardia, resolved with IV fluid administration, supportive therapy, continue lower dose of metoprolol #7. Community acquired bacterial pneumonia?  The patient was treated with broad-spectrum antibiotics, but so far pleural fluid, blood cultures are negative. Get procalcitonin level, continue levofloxacin  Management plans discussed with the patient, family and they are in agreement.   DRUG ALLERGIES:  Allergies  Allergen Reactions  . Azithromycin Rash    CODE STATUS:     Code Status Orders        Start      Ordered   09/24/15 2246  Full code   Continuous     09/24/15 2245    Code Status History    Date Active Date Inactive Code Status Order ID Comments User Context   This patient has a current code status but no historical code status.      TOTAL TIME TAKING CARE OF THIS PATIENT: 35 minutes.   Discussed with patient,DR Aram Beecham M.D on 09/28/2015 at 2:58 PM  Between 7am to 6pm - Pager - [phone]  After 6pm go to [email or web address] - password [email or web address] [facility]  [facility]  Office  [phone]  CC: Primary care physician; No primary care provider on file.

## 2015-09-28 NOTE — Progress Notes (Signed)
ANTIBIOTIC CONSULT NOTE - INITIAL  Pharmacy Consult for Levaquin  Indication: pneumonia  Allergies  Allergen Reactions  . Azithromycin Rash    Patient Measurements: Height: 4\' 11"  (149.9 cm) Weight: 136 lb 9.6 oz (61.961 kg) IBW/kg (Calculated) : 43.2 Adjusted Body Weight:   Vital Signs: Temp: 98.7 F (37.1 C) (05/16 1909) Temp Source: Oral (05/16 1909) BP: 110/32 mmHg (05/16 1909) Pulse Rate: 99 (05/16 1909) Intake/Output from previous day: 05/15 0701 - 05/16 0700 In: 5444.7 [P.O.:480; I.V.:4964.7] Out: -  Intake/Output from this shift:    Labs:  Recent Labs  09/28/15 0336 09/28/15 1806  WBC 13.9*  --   HGB 12.4  --   PLT 353  --   CREATININE  --  0.70   Estimated Creatinine Clearance: 52.4 mL/min (by C-G formula based on Cr of 0.7). No results for input(s): VANCOTROUGH, VANCOPEAK, VANCORANDOM, GENTTROUGH, GENTPEAK, GENTRANDOM, TOBRATROUGH, TOBRAPEAK, TOBRARND, AMIKACINPEAK, AMIKACINTROU, AMIKACIN in the last 72 hours.   Microbiology: Recent Results (from the past 720 hour(s))  Blood culture (routine x 2)     Status: None (Preliminary result)   Collection Time: 09/24/15 10:09 PM  Result Value Ref Range Status   Specimen Description BLOOD RIGHT ASSIST CONTROL  Final   Special Requests   Final    BOTTLES DRAWN AEROBIC AND ANAEROBIC 14CCAERO,15CCANA   Culture NO GROWTH 4 DAYS  Final   Report Status PENDING  Incomplete  Blood culture (routine x 2)     Status: None (Preliminary result)   Collection Time: 09/24/15 10:09 PM  Result Value Ref Range Status   Specimen Description BLOOD LEFT HAND  Final   Special Requests BOTTLES DRAWN AEROBIC AND ANAEROBIC [place]  Final   Culture NO GROWTH 4 DAYS  Final   Report Status PENDING  Incomplete  Culture, body fluid-bottle     Status: None (Preliminary result)   Collection Time: 09/25/15  2:00 PM  Result Value Ref Range Status   Specimen Description PLEURAL  Final   Special Requests NONE  Final   Gram Stain    Final    WBC SEEN WBC PRESENT,BOTH PMN AND MONONUCLEAR NO ORGANISMS SEEN CYTOSPIN SMEAR    Culture NO GROWTH 3 DAYS  Final   Report Status PENDING  Incomplete    Medical History: Past Medical History  Diagnosis Date  . MI (myocardial infarction) ([place])   . Arthritis   . CHF (congestive heart failure) ([place])   . Hypertension   . CAD (coronary artery disease)     Medications:  Prescriptions prior to admission  Medication Sig Dispense Refill Last Dose  . [EXPIRED] amoxicillin (AMOXIL) 875 MG tablet Take 875 mg by mouth 2 (two) times daily. for 10 days  0 09/24/2015 at 0800  . clopidogrel (PLAVIX) 75 MG tablet Take 1 tablet by mouth daily.   09/24/2015 at 0800  . metoprolol tartrate (LOPRESSOR) 25 MG tablet Take 1 tablet by mouth 2 (two) times daily.   09/24/2015 at 0800  . SUBOXONE 8-2 MG FILM Place 1 Film under the tongue 3 (three) times daily.  0 09/24/2015 at 0800   Assessment: CrCl = 52.4 ml/min   Goal of Therapy:  resolution of infection  Plan:  Expected duration 7 days with resolution of temperature and/or normalization of WBC   Levaquin 500 mg IV Q24H currently ordered.  No dose adjustment needed.   Marciano Mundt D 09/28/2015,9:00 PM

## 2015-09-28 NOTE — Progress Notes (Signed)
Called dr about patient BP and HR. Dr wants to hold patient night time BP medication, will continue to monitor.

## 2015-09-28 NOTE — Plan of Care (Signed)
Problem: Respiratory: Goal: Respiratory status will improve Outcome: Not Progressing Pt still remaining on oxygen at 2L. Goal: Ability to maintain a clear airway will improve Outcome: Progressing No complaints of shortness of breath. Goal: Pain level will decrease Outcome: Progressing Pain control with oral pain medications.

## 2015-09-28 NOTE — Care Management Important Message (Signed)
Important Message  Patient Details  Name: Kathryn Garrett MRN: [id number] Date of Birth: 1945-09-15   Medicare Important Message Given:  Yes    [name] 09/28/2015, 3:50 PM

## 2015-09-28 NOTE — Progress Notes (Signed)
Call to Dr. Ether Griffins, verbal orders to resume previous diet order until NPO order at midnight for 09/28/15 0001.

## 2015-09-29 ENCOUNTER — Encounter: Payer: Self-pay | Admitting: Cardiothoracic Surgery

## 2015-09-29 ENCOUNTER — Inpatient Hospital Stay: Payer: Medicare HMO

## 2015-09-29 ENCOUNTER — Encounter
Admission: EM | Disposition: A | Discharge status: Home / Self Care | Payer: Self-pay | Source: Home / Self Care | Attending: Internal Medicine

## 2015-09-29 ENCOUNTER — Inpatient Hospital Stay: Payer: Medicare HMO | Admitting: Certified Registered Nurse Anesthetist

## 2015-09-29 DIAGNOSIS — J90 Pleural effusion, not elsewhere classified: Secondary | ICD-10-CM

## 2015-09-29 HISTORY — PX: VIDEO ASSISTED THORACOSCOPY (VATS)/THOROCOTOMY: SHX6173

## 2015-09-29 LAB — CBC
HCT: 34.6 % — ABNORMAL LOW (ref 35.0–47.0)
Hemoglobin: 11.8 g/dL — ABNORMAL LOW (ref 12.0–16.0)
MCH: 31.4 pg (ref 26.0–34.0)
MCHC: 34 g/dL (ref 32.0–36.0)
MCV: 92.4 fL (ref 80.0–100.0)
PLATELETS: 359 10*3/uL (ref 150–440)
RBC: 3.74 MIL/uL — AB (ref 3.80–5.20)
RDW: 13 % (ref 11.5–14.5)
WBC: 11.8 10*3/uL — ABNORMAL HIGH (ref 3.6–11.0)

## 2015-09-29 IMAGING — DX DG CHEST 1V PORT
1 series · 1 of 1 positions shown · non-contrast
Comparison: [DATE]

CLINICAL DATA: Postop chest tube.

EXAM:
PORTABLE CHEST 1 VIEW

[chest ap]
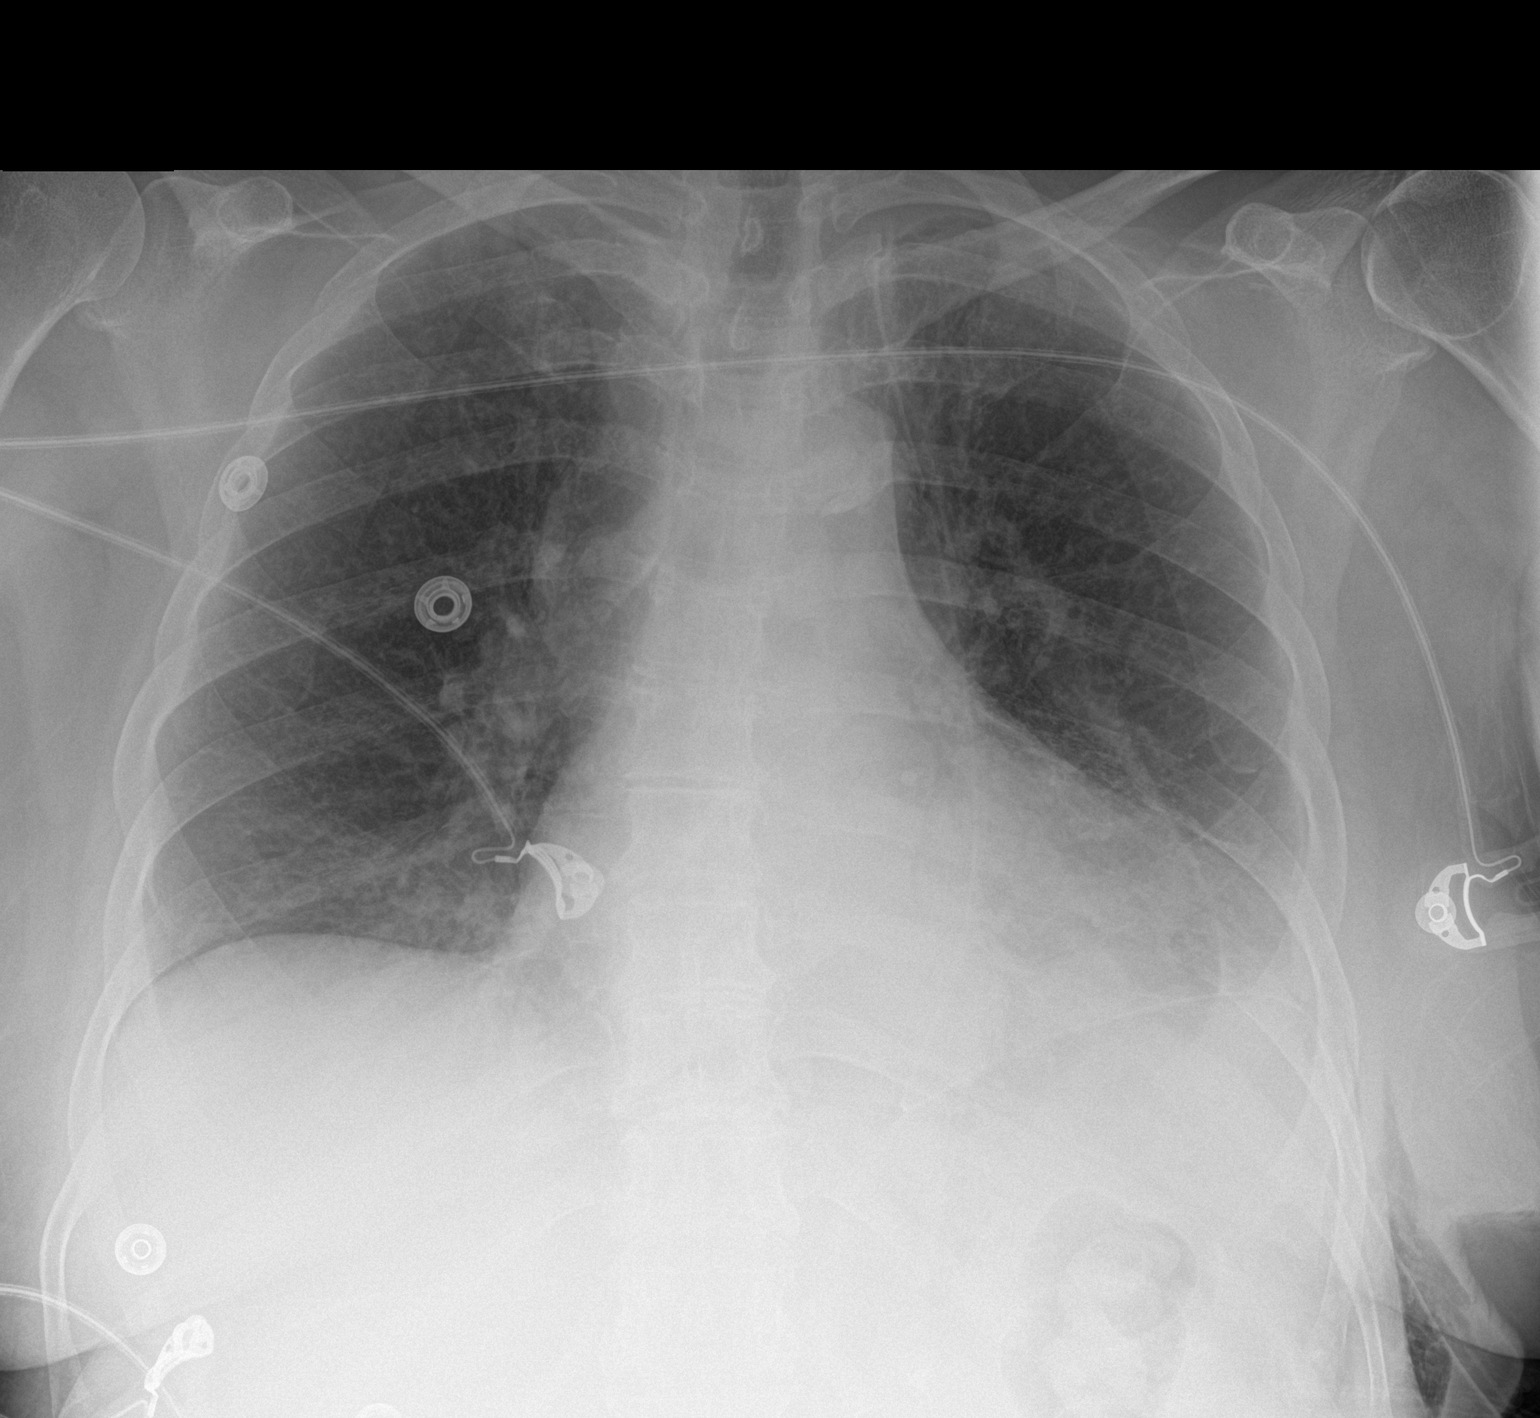

[1 of 1 positions shown; findings below may reference images not displayed]

FINDINGS: The cardiac silhouette is mildly enlarged. A small caliber left
pleural catheter has been placed and courses from the lung base
superiorly, terminating near the apex. Left pleural effusion has
greatly decreased in size, with small volume fluid remaining. There
is improved aeration of the left lung base with mild parenchymal
opacity likely reflecting atelectasis. No pneumothorax or acute
osseous abnormality is identified.
IMPRESSION: 1. Interval left pleural catheter placement with small volume
residual pleural effusion, greatly decreased from prior.
2. Mild left basilar atelectasis.

## 2015-09-29 SURGERY — VIDEO ASSISTED THORACOSCOPY (VATS)/THOROCOTOMY
Anesthesia: General | Laterality: Left | Wound class: Clean

## 2015-09-29 MED ORDER — ONDANSETRON HCL 4 MG/2ML IJ SOLN
INTRAMUSCULAR | Status: DC | PRN
Start: 2015-09-29 — End: 2015-09-29
  Administered 2015-09-29: 4 mg via INTRAVENOUS

## 2015-09-29 MED ORDER — KETAMINE HCL 50 MG/ML IJ SOLN
INTRAMUSCULAR | Status: DC | PRN
  Administered 2015-09-29 (×2): 25 mg via INTRAMUSCULAR

## 2015-09-29 MED ORDER — MIDAZOLAM HCL 2 MG/2ML IJ SOLN
INTRAMUSCULAR | Status: DC | PRN
  Administered 2015-09-29: 1 mg via INTRAVENOUS

## 2015-09-29 MED ORDER — FENTANYL CITRATE (PF) 100 MCG/2ML IJ SOLN
25.0000 ug | INTRAMUSCULAR | Status: AC | PRN
  Administered 2015-09-29 (×6): 25 ug via INTRAVENOUS

## 2015-09-29 MED ORDER — FENTANYL CITRATE (PF) 100 MCG/2ML IJ SOLN
INTRAMUSCULAR | Status: AC
  Administered 2015-09-29: 25 ug via INTRAVENOUS
  Filled 2015-09-29: qty 2

## 2015-09-29 MED ORDER — ALBUTEROL SULFATE (2.5 MG/3ML) 0.083% IN NEBU
2.5000 mg | INHALATION_SOLUTION | RESPIRATORY_TRACT | Status: DC
  Administered 2015-09-29 (×2): 2.5 mg via RESPIRATORY_TRACT
  Filled 2015-09-29 (×2): qty 3

## 2015-09-29 MED ORDER — LACTATED RINGERS IV SOLN
INTRAVENOUS | Status: DC | PRN
Start: 2015-09-29 — End: 2015-09-29
  Administered 2015-09-29: 08:00:00 via INTRAVENOUS

## 2015-09-29 MED ORDER — BISACODYL 5 MG PO TBEC
10.0000 mg | DELAYED_RELEASE_TABLET | Freq: Every day | ORAL | Status: DC
  Administered 2015-09-30: 11:00:00 10 mg via ORAL
  Filled 2015-09-29 (×2): qty 2

## 2015-09-29 MED ORDER — SUCCINYLCHOLINE CHLORIDE 20 MG/ML IJ SOLN
INTRAMUSCULAR | Status: DC | PRN
  Administered 2015-09-29: 100 mg via INTRAVENOUS

## 2015-09-29 MED ORDER — ONDANSETRON HCL 4 MG/2ML IJ SOLN: 4.0000 mg | Freq: Four times a day (QID) | INTRAMUSCULAR | Status: DC | PRN

## 2015-09-29 MED ORDER — ACETAMINOPHEN 10 MG/ML IV SOLN
INTRAVENOUS | Status: DC | PRN
  Administered 2015-09-29: 1000 mg via INTRAVENOUS

## 2015-09-29 MED ORDER — ONDANSETRON HCL 4 MG/2ML IJ SOLN: 4.0000 mg | Freq: Once | INTRAMUSCULAR | Status: DC | PRN

## 2015-09-29 MED ORDER — ALBUTEROL SULFATE (2.5 MG/3ML) 0.083% IN NEBU
2.5000 mg | INHALATION_SOLUTION | RESPIRATORY_TRACT | Status: DC
  Administered 2015-09-30 (×3): 2.5 mg via RESPIRATORY_TRACT
  Filled 2015-09-29 (×3): qty 3

## 2015-09-29 MED ORDER — LEVOFLOXACIN IN D5W 500 MG/100ML IV SOLN
INTRAVENOUS | Status: AC
  Filled 2015-09-29: qty 100

## 2015-09-29 MED ORDER — SUGAMMADEX SODIUM 500 MG/5ML IV SOLN
INTRAVENOUS | Status: DC | PRN
  Administered 2015-09-29: 950 mg via INTRAVENOUS

## 2015-09-29 MED ORDER — TALC 5 G PL SUSR
5.0000 g | Freq: Once | INTRAPLEURAL | Status: DC
  Filled 2015-09-29: qty 5

## 2015-09-29 MED ORDER — LIDOCAINE HCL (CARDIAC) 20 MG/ML IV SOLN
INTRAVENOUS | Status: DC | PRN
  Administered 2015-09-29: 50 mg via INTRAVENOUS

## 2015-09-29 MED ORDER — PHENYLEPHRINE HCL 10 MG/ML IJ SOLN
INTRAMUSCULAR | Status: DC | PRN
  Administered 2015-09-29: 100 ug via INTRAVENOUS

## 2015-09-29 MED ORDER — PROPOFOL 10 MG/ML IV BOLUS
INTRAVENOUS | Status: DC | PRN
  Administered 2015-09-29: 150 mg via INTRAVENOUS

## 2015-09-29 MED ORDER — ROCURONIUM BROMIDE 100 MG/10ML IV SOLN
INTRAVENOUS | Status: DC | PRN
Start: 2015-09-29 — End: 2015-09-29
  Administered 2015-09-29: 45 mg via INTRAVENOUS
  Administered 2015-09-29: 5 mg via INTRAVENOUS

## 2015-09-29 MED ORDER — DEXTROSE-NACL 5-0.45 % IV SOLN
INTRAVENOUS | Status: DC
  Administered 2015-09-29 – 2015-09-30 (×4): via INTRAVENOUS

## 2015-09-29 MED ORDER — FENTANYL CITRATE (PF) 100 MCG/2ML IJ SOLN
INTRAMUSCULAR | Status: DC | PRN
  Administered 2015-09-29: 50 ug via INTRAVENOUS
  Administered 2015-09-29: 100 ug via INTRAVENOUS
  Administered 2015-09-29 (×2): 50 ug via INTRAVENOUS

## 2015-09-29 MED ORDER — HEPARIN SODIUM (PORCINE) 5000 UNIT/ML IJ SOLN
INTRAMUSCULAR | Status: AC
  Filled 2015-09-29: qty 1

## 2015-09-29 MED ORDER — LIDOCAINE HCL 1 % IJ SOLN
INTRAMUSCULAR | Status: DC | PRN
  Administered 2015-09-29: 10 mL

## 2015-09-29 SURGICAL SUPPLY — 59 items
BNDG COHESIVE 4X5 TAN STRL (GAUZE/BANDAGES/DRESSINGS) IMPLANT
BRONCHOSCOPE PED SLIM DISP (MISCELLANEOUS) ×2 IMPLANT
CANISTER SUCT 3000ML PPV (MISCELLANEOUS) ×2 IMPLANT
CATH TRAY 16F METER LATEX (MISCELLANEOUS) ×2 IMPLANT
CATH URET ROBINSON 16FR STRL (CATHETERS) IMPLANT
CHLORAPREP W/TINT 26ML (MISCELLANEOUS) ×4 IMPLANT
CNTNR SPEC 2.5X3XGRAD LEK (MISCELLANEOUS) ×1
CONT SPEC 4OZ STER OR WHT (MISCELLANEOUS) ×1
CONTAINER SPEC 2.5X3XGRAD LEK (MISCELLANEOUS) ×1 IMPLANT
CUTTER ECHEON FLEX ENDO 45 340 (ENDOMECHANICALS) IMPLANT
DEFOGGER SCOPE WARMER CLEARIFY (MISCELLANEOUS) ×2 IMPLANT
DRAIN CHEST DRY SUCT SGL (MISCELLANEOUS) IMPLANT
DRAPE C-SECTION (MISCELLANEOUS) ×2 IMPLANT
DRAPE MAG INST 16X20 L/F (DRAPES) ×2 IMPLANT
DRSG OPSITE POSTOP 3X4 (GAUZE/BANDAGES/DRESSINGS) IMPLANT
ELECT BLADE 6 FLAT ULTRCLN (ELECTRODE) IMPLANT
ELECT BLADE 6.5 EXT (BLADE) IMPLANT
ELECT CAUTERY BLADE TIP 2.5 (TIP) ×2
ELECT REM PT RETURN 9FT ADLT (ELECTROSURGICAL) ×2
ELECTRODE CAUTERY BLDE TIP 2.5 (TIP) ×1 IMPLANT
ELECTRODE REM PT RTRN 9FT ADLT (ELECTROSURGICAL) ×1 IMPLANT
GAUZE SPONGE 4X4 12PLY STRL (GAUZE/BANDAGES/DRESSINGS) ×2 IMPLANT
GLOVE SURG SYN 7.5  E (GLOVE) ×4
GLOVE SURG SYN 7.5 E (GLOVE) ×4 IMPLANT
GOWN STRL REUS W/ TWL LRG LVL3 (GOWN DISPOSABLE) ×2 IMPLANT
GOWN STRL REUS W/TWL LRG LVL3 (GOWN DISPOSABLE) ×2
KIT PLEURX DRAIN CATH 1000ML (MISCELLANEOUS) ×2 IMPLANT
KIT PLEURX DRAIN CATH 15.5FR (DRAIN) ×2 IMPLANT
KIT RM TURNOVER STRD PROC AR (KITS) ×2 IMPLANT
LABEL OR SOLS (LABEL) ×2 IMPLANT
LOOP RED MAXI  1X406MM (MISCELLANEOUS)
LOOP VESSEL MAXI 1X406 RED (MISCELLANEOUS) IMPLANT
MARKER SKIN DUAL TIP RULER LAB (MISCELLANEOUS) ×2 IMPLANT
NEEDLE SPNL 20GX3.5 QUINCKE YW (NEEDLE) IMPLANT
NS IRRIG 500ML POUR BTL (IV SOLUTION) ×2 IMPLANT
PACK BASIN MAJOR ARMC (MISCELLANEOUS) ×2 IMPLANT
SCISSORS METZENBAUM CVD 33 (INSTRUMENTS) IMPLANT
SET DRAINAGE LINE (MISCELLANEOUS) ×2 IMPLANT
SPONGE KITTNER 5P (MISCELLANEOUS) ×2 IMPLANT
STAPLER SKIN PROX 35W (STAPLE) IMPLANT
STAPLER VASCULAR ECHELON 35 (CUTTER) IMPLANT
SUT ETHILON 4 0 P 3 18 (SUTURE) ×2 IMPLANT
SUT ETHILON 4-0 (SUTURE)
SUT ETHILON 4-0 FS2 18XMFL BLK (SUTURE)
SUT MNCRL AB 3-0 PS2 27 (SUTURE) ×2 IMPLANT
SUT SILK 1 SH (SUTURE) ×4 IMPLANT
SUT VIC AB 0 CT1 36 (SUTURE) ×4 IMPLANT
SUT VIC AB 2-0 CT1 27 (SUTURE)
SUT VIC AB 2-0 CT1 TAPERPNT 27 (SUTURE) IMPLANT
SUT VIC AB 2-0 CT2 27 (SUTURE) ×4 IMPLANT
SUT VICRYL 2 TP 1 (SUTURE) IMPLANT
SUTURE ETHLN 4-0 FS2 18XMF BLK (SUTURE) IMPLANT
SYR BULB IRRIG 60ML STRL (SYRINGE) ×2 IMPLANT
TAPE ADH 3 LX (MISCELLANEOUS) ×2 IMPLANT
TAPE TRANSPORE STRL 2 31045 (GAUZE/BANDAGES/DRESSINGS) IMPLANT
TROCAR FLEXIPATH 20X80 (ENDOMECHANICALS) ×2 IMPLANT
TROCAR FLEXIPATH THORACIC 15MM (ENDOMECHANICALS) IMPLANT
WATER STERILE IRR 1000ML POUR (IV SOLUTION) ×2 IMPLANT
YANKAUER SUCT BULB TIP FLEX NO (MISCELLANEOUS) ×2 IMPLANT

## 2015-09-29 NOTE — Care Management (Signed)
Transferred to 1C today after bronchoscopy, thoracoscopy, biopsy and pleurx catheter.

## 2015-09-29 NOTE — Anesthesia Procedure Notes (Signed)
Procedure Name: Intubation Date/Time: 09/29/2015 11:28 AM Performed by: Johnna Acosta Pre-anesthesia Checklist: Patient identified, Emergency Drugs available, Suction available, Patient being monitored and Timeout performed Patient Re-evaluated:Patient Re-evaluated prior to inductionOxygen Delivery Method: Circle system utilized Preoxygenation: Pre-oxygenation with 100% oxygen Intubation Type: IV induction Ventilation: Mask ventilation without difficulty Laryngoscope Size: Miller and 4 Grade View: Grade I Tube type: Oral Endobronchial tube: Double lumen EBT, Left, EBT position confirmed by auscultation and EBT position confirmed by fiberoptic bronchoscope and 37 Fr Number of attempts: 1 Airway Equipment and Method: Stylet Placement Confirmation: ETT inserted through vocal cords under direct vision,  positive ETCO2 and breath sounds checked- equal and bilateral Secured at: 27 cm Tube secured with: Tape Dental Injury: Teeth and Oropharynx as per pre-operative assessment

## 2015-09-29 NOTE — Progress Notes (Signed)
[facility] NAME: Kathryn Garrett    MR#:  [id number]  DATE OF BIRTH:  [birth year]  SUBJECTIVE:  CHIEF COMPLAINT:   Chief Complaint  Patient presents with  . Pleurisy  Patient is 70 year old Caucasian female with past history significant for history of coronary artery disease, CHF, hypertension, who presents to the hospital with complaints of shortness of breath, left-sided chest pains. Apparently patient was diagnosed with bronchitis couple weeks ago, given azithromycin, however, over the past few days Prior to coming to the hospital she's been feeling poorly, having chills, nonproductive cough, left-sided chest pain. She was seen in emergency room, where she was noted to be tachycardic, had leukocytosis on lab studies, CT scan revealed pleural effusion. Patient was admitted for therapy due to concerns of empyema. The patient underwent thoracentesis 09/26/2015, pain has improved somewhat, less shortness of breath. Pleural fluid studies revealed elevated white blood cell count, LDH, but normal glucose level. Cultures did not show an any growth so far.. Cytology of pleural fluid is pending. Patient underwent preoperative bronchoscopy, left thoracoscopy with pleural biopsy, insertion of Pleurx catheter by  Dr. Genevive Bi today 17th of May 2017. Feels satisfactory, denies any significant pain, wants to eat, remains very somnolent, not able to review systems  Review of Systems  Unable to perform ROS: mental acuity    VITAL SIGNS: Blood pressure 120/55, pulse 96, temperature 98.2 F (36.8 C), temperature source Oral, resp. rate 20, height 4\' 11"  (1.499 m), weight 61.961 kg (136 lb 9.6 oz), SpO2 98 %.  PHYSICAL EXAMINATION:   GENERAL:  70 y.o.-year-old patient lying in the bed, not in distress , comfortable today, but sleepy  EYES: Pupils equal, round, reactive to light and accommodation. No scleral icterus. Extraocular muscles intact.  HEENT:  Head atraumatic, normocephalic. Oropharynx and nasopharynx clear.  NECK:  Supple, no jugular venous distention. No thyroid enlargement, no tenderness.  LUNGS: Better air entranceon the left ,  normal air entrance on the right, no wheezing, rales,rhonchi or crepitation. No use of accessory muscles of respiration. Pleurx catheter is draining minimal amount of bloody fluid  CARDIOVASCULAR: S1, S2 normal. No murmurs, rubs, or gallops.  ABDOMEN: Soft, nontender, nondistended. Bowel sounds present. No organomegaly or mass.  EXTREMITIES: No pedal edema, cyanosis, or clubbing.  NEUROLOGIC: Cranial nerves II through XII are intact. Muscle strength 5/5 in all extremities. Sensation intact. Gait not checked.  PSYCHIATRIC: The patient is somnolent, difficult to assess orientation.  SKIN: No obvious rash, lesion, or ulcer.   ORDERS/RESULTS REVIEWED:   CBC  Recent Labs Lab 09/24/15 1807 09/25/15 0438 09/28/15 0336 09/29/15 0400  WBC 14.0* 12.1* 13.9* 11.8*  HGB 13.7 12.2 12.4 11.8*  HCT 41.8 37.0 36.5 34.6*  PLT 356 340 353 359  MCV 95.4 94.4 92.4 92.4  MCH 31.3 31.2 31.3 31.4  MCHC 32.8 33.1 33.9 34.0  RDW 13.4 13.2 13.1 13.0   ------------------------------------------------------------------------------------------------------------------  Chemistries   Recent Labs Lab 09/24/15 1807 09/25/15 0438 09/28/15 1806  NA 140 139 136  K 4.3 4.0 3.8  CL 109 110 103  CO2 20* 26 27  GLUCOSE 92 119* 133*  BUN 20 15 15   CREATININE 0.65 0.66 0.70  CALCIUM 9.1 8.8* 8.2*  AST 19 14* 15  ALT 12* 11* 11*  ALKPHOS 73 70 58  BILITOT 0.3 0.3 0.3   ------------------------------------------------------------------------------------------------------------------ estimated creatinine clearance is 52.4 mL/min (by C-G formula based on Cr of 0.7). ------------------------------------------------------------------------------------------------------------------ No results for input(s):  TSH, T4TOTAL,  T3FREE, THYROIDAB in the last 72 hours.  Invalid input(s): FREET3  Cardiac Enzymes  Recent Labs Lab 09/24/15 1807  TROPONINI <0.03   ------------------------------------------------------------------------------------------------------------------ Invalid input(s): POCBNP ---------------------------------------------------------------------------------------------------------------  RADIOLOGY: US Abdomen Complete  09/28/2015  CLINICAL DATA:  Left upper quadrant abdominal pain. Shortness of breath. Clinical concern for ascites. EXAM: ABDOMEN ULTRASOUND COMPLETE COMPARISON:  None. FINDINGS: Gallbladder: No gallstones or wall thickening visualized. No sonographic Murphy sign noted by sonographer. Common bile duct: Diameter: 5 mm Liver: Liver parenchymal echogenicity is at the upper limits of normal. Liver parenchymal echotexture is within normal limits. No definite liver surface irregularity. No liver mass demonstrated. IVC: No abnormality visualized. Pancreas: Visualized portion unremarkable. Spleen: Size and appearance within normal limits. Right Kidney: Length: 10.6 cm. Echogenicity within normal limits. No mass or hydronephrosis visualized. Left Kidney: Length: 11.0 cm. Echogenicity within normal limits. No mass or hydronephrosis visualized. Abdominal aorta: No aneurysm visualized. Other findings: Left pleural effusion is incidentally noted. No ascites. IMPRESSION: 1. Normal abdominal sonogram. No ascites. Liver appears normal. Normal size spleen. 2. Incidental left pleural effusion. Electronically Signed   By: Ilona Sorrel M.D.   On: 09/28/2015 09:02   Dg Chest Port 1 View  09/29/2015  CLINICAL DATA:  Postop chest tube. EXAM: PORTABLE CHEST 1 VIEW COMPARISON:  09/27/2015 FINDINGS: The cardiac silhouette is mildly enlarged. A small caliber left pleural catheter has been placed and courses from the lung base superiorly, terminating near the apex. Left pleural effusion has greatly decreased in  size, with small volume fluid remaining. There is improved aeration of the left lung base with mild parenchymal opacity likely reflecting atelectasis. No pneumothorax or acute osseous abnormality is identified. IMPRESSION: 1. Interval left pleural catheter placement with small volume residual pleural effusion, greatly decreased from prior. 2. Mild left basilar atelectasis. Electronically Signed   By: Logan Bores M.D.   On: 09/29/2015 13:30    EKG:  Orders placed or performed during the hospital encounter of 09/24/15  . ED EKG  . ED EKG  . EKG 12-Lead  . EKG 12-Lead    ASSESSMENT AND PLAN:  Principal Problem:   Sepsis ([place]) Active Problems:   Pleural effusion on left   CAP (community acquired pneumonia)   CAD (coronary artery disease)   HTN (hypertension)   Pleural effusion  #1. Sepsis, was felt due to Community-acquired pneumonia and parapneumonic effusion/empyema, continue levofloxacin for 7 day course per Dr. Blane Ohara recommendations , cultures were negative, status post left thoracentesis 09/25/2015. Pleural fluid as well as Blood cultures are negative so far, sputum culture is pending. Pleural fluid cytology is negative for malignancy. White blood cell count improved to 11.8 today after levofloxacin was started. Repeated chest x-ray showed some fluid reaccumulation. Patient was seen by thoracic surgeon, Dr. Genevive Bi, patient underwent thoracoscopy  with pleural biopsy, Pleurx catheter placement, perioperative bronchoscopy. Continue supportive care.  Appreciate Dr. Genevive Bi and Dr. Blane Ohara input #2. Left-sided Pleural effusion, which was suspected due to pneumonia/parapneumonic effusion, status post left thoracentesis 09/25/2015,   cultures are negative, . Pleural fluid cytology is negative for malignancy. Patient underwent pleural biopsy today, results pending #3. Metabolic acidosis, as based on basic metabolic panel on admission, no lactic acidosis, improved , following closely #4.  Leukocytosis, improved today on levofloxacin, Dr. Ola Spurr recommends to continue antibiotic for 7 days #5. Essential hypertension, now patient is relatively hypotensive, now on decreased metoprolol dose #6. Sinus tachycardia, resolved with IV fluid administration, supportive therapy, continue lower dose of metoprolol #7.  Community acquired bacterial pneumonia  . Procalcitonin level is low at less than 0.1, however, infectious disease specialist, Dr. Ola Spurr recommended antibiotic therapy for 7 day, continue levofloxacin for 7 day therapy, awaiting for pleural biopsy results. Pleural fluid cytology is negative for malignancy.   Management plans discussed with the patient, family and they are in agreement.   DRUG ALLERGIES:  Allergies  Allergen Reactions  . Azithromycin Rash    CODE STATUS:     Code Status Orders        Start     Ordered   09/24/15 2246  Full code   Continuous     09/24/15 2245    Code Status History    Date Active Date Inactive Code Status Order ID Comments User Context   This patient has a current code status but no historical code status.      TOTAL TIME TAKING CARE OF THIS PATIENT: 35 minutes.   Discussed with Patient, family, all questions were answered, voiced understanding, emotional support provided  Piotr Christopher M.D on 09/29/2015 at 6:41 PM  Between 7am to 6pm - Pager - [phone]  After 6pm go to [email or web address] - password [email or web address] [facility]  [facility]  Office  [phone]  CC: Primary care physician; No primary care provider on file.

## 2015-09-29 NOTE — Op Note (Signed)
  09/24/2015 - 09/29/2015  3:41 PM  PATIENT:  Kathryn Garrett  70 y.o. female  PRE-OPERATIVE DIAGNOSIS:  Recurrent left pleural effusion  POST-OPERATIVE DIAGNOSIS:  Same  PROCEDURE:  #1 preoperative bronchoscopy to assess endobronchial anatomy #2 left thoracoscopy with pleural biopsy #3 insertion of Pleurx catheter  SURGEON:  Surgeon(s) and Role:    * [name], MD - Primary  ASSISTANTS: None  ANESTHESIA: Gen.  INDICATIONS FOR PROCEDURE this is a 70 year old woman with a history of increasing shortness of breath and a chest x-ray showing a recurrent left-sided pleural effusion after thoracentesis. She has a history of smoking and analysis of the pleural fluid was nondiagnostic. She was therefore offered the above-named procedure for definitive diagnosis and treatment. In occasions and risks of the procedure were explained to the patient gave her informed consent.  DICTATION: The patient is brought to the operating suite and placed in the supine position. General endotracheal anesthesia was given through a double-lumen tube. Preoperative bronchoscopy was carried out and was normal for any major airway obstruction or tumor. The patient was then positioned for left thoracoscopy. All pressure points were carefully padded. Patient was prepped and draped in usual sterile fashion. A 20 mm incision was made on the lower aspect of the left hemithorax and the incision was deepened down through the muscles of the chest until the pleural space was entered. There is approximate 500 cc of clear slightly yellow fluid. This was sent for analysis. A thoracoscope was then introduced. There were multiple small 5-6 mm nodules scattered throughout the pleural space. There were some present on the surface of the lung as well. These had an atypical appearance for malignancy. Multiple samples were taken and sent for frozen section which was essentially nondiagnostic. There were sufficient samples present but a  definitive diagnosis of malignancy could not be made. Additional samples were then sent for culture from the pleural biopsies. In the left lower lobe there were several small nodules that were yellowish in appearance and these were similar to what was present on the chest wall. At the completion of this procedure with an placed a Pleurx catheter in the paravertebral space and brought out through a separate stab wound. The wounds were then closed in multiple layers using Vicryl on the muscle. The skin was ultimately closed with nylon. The catheter was secured and connected to a Pleur-evac. Sterile dressings were applied. The patient is expanded and taken to the recovery room in stable condition.   [name], MD

## 2015-09-29 NOTE — Anesthesia Preprocedure Evaluation (Signed)
Anesthesia Evaluation  Patient identified by MRN, date of birth, ID band Patient awake    Reviewed: Allergy & Precautions, H&P , NPO status , Patient's Chart, lab work & pertinent test results, reviewed documented beta blocker date and time   Airway Mallampati: II  TM Distance: >3 FB Neck ROM: full    Dental  (+) Teeth Intact   Pulmonary neg pulmonary ROS, pneumonia, resolved, Current Smoker,    Pulmonary exam normal        Cardiovascular hypertension, + CAD, + Past MI and +CHF  negative cardio ROS Normal cardiovascular exam Rhythm:regular Rate:Normal     Neuro/Psych negative neurological ROS  negative psych ROS   GI/Hepatic negative GI ROS, Neg liver ROS,   Endo/Other  negative endocrine ROS  Renal/GU negative Renal ROS  negative genitourinary   Musculoskeletal   Abdominal   Peds  Hematology negative hematology ROS (+)   Anesthesia Other Findings Past Medical History:   MI (myocardial infarction) ([place])                             Arthritis                                                    CHF (congestive heart failure) ([facility])                         Hypertension                                                 CAD (coronary artery disease)                              Past Surgical History:   ABDOMINAL HYSTERECTOMY                                      BMI    Body Mass Index   27.57 kg/m 2     Reproductive/Obstetrics negative OB ROS                             Anesthesia Physical Anesthesia Plan  ASA: III  Anesthesia Plan: General ETT   Post-op Pain Management:    Induction: Intravenous  Airway Management Planned: Double Lumen EBT  Additional Equipment:   Intra-op Plan:   Post-operative Plan:   Informed Consent: I have reviewed the patients History and Physical, chart, labs and discussed the procedure including the risks, benefits and alternatives for the proposed  anesthesia with the patient or authorized representative who has indicated his/her understanding and acceptance.   Dental Advisory Given  Plan Discussed with: CRNA  Anesthesia Plan Comments:         Anesthesia Quick Evaluation

## 2015-09-29 NOTE — Interval H&P Note (Signed)
History and Physical Interval Note:  09/29/2015 7:17 AM  Kathryn Garrett  has presented today for surgery, with the diagnosis of L  The various methods of treatment have been discussed with the patient and family. After consideration of risks, benefits and other options for treatment, the patient has consented to  Procedure(s): PREOP BRONCHOSCOPY, LEFT THORACOSCOPY, POSSIBLE THORACOTOMY, PLEURAL BIOPSY, TALC (Left) as a surgical intervention .  The patient's history has been reviewed, patient examined, no change in status, stable for surgery.  I have reviewed the patient's chart and labs.  Questions were answered to the patient's satisfaction.     [name]

## 2015-09-29 NOTE — Transfer of Care (Signed)
Immediate Anesthesia Transfer of Care Note  Patient: Kathryn Garrett  Procedure(s) Performed: Procedure(s): PREOP BRONCHOSCOPY, LEFT THORACOSCOPY, POSSIBLE THORACOTOMY, PLEURAL BIOPSY, TALC (Left)  Patient Location: PACU  Anesthesia Type:General  Level of Consciousness: awake and alert   Airway & Oxygen Therapy: Patient Spontanous Breathing and Patient connected to face mask oxygen  Post-op Assessment: Report given to RN and Post -op Vital signs reviewed and stable  Post vital signs: Reviewed and stable  Last Vitals:  Filed Vitals:   09/28/15 2346 09/29/15 0418  BP: 128/45 106/44  Pulse: 94 94  Temp: 36.8 C 37.1 C  Resp: 18 18    Last Pain:  Filed Vitals:   09/29/15 0419  PainSc: 4          Complications: No apparent anesthesia complications

## 2015-09-29 NOTE — H&P (View-Only) (Signed)
[facility] Post-[facility] Note  Patient ID: Yanitzia Traino, female   DOB: 07-Jan-1946, 70 y.o.   MRN: [id number]  HISTORY: She had an ultrasound of her abdomen and a echocardiogram performed. She has no new complaints today.   Filed Vitals:   09/28/15 0414 09/28/15 0955  BP: 101/43 106/38  Pulse: 96 90  Temp: 98.4 F (36.9 C)   Resp: 19      EXAM: Resp: Lungs are clear bilaterally.  No respiratory distress, normal effort. Heart:  Regular without murmurs Abd:  Abdomen is soft, non distended and non tender. No masses are palpable.  There is no rebound and no guarding.  Neurological: Alert and oriented to person, place, and time. Coordination normal.  Skin: Skin is warm and dry. No rash noted. No diaphoretic. No erythema. No pallor.  Psychiatric: Normal mood and affect. Normal behavior. Judgment and thought content normal.    ASSESSMENT: I have reviewed the results of the echo and ultrasound. I discussed her care with Dr. Ether Griffins.  The echocardiogram and ultrasound were sensing normal. The pleural fluid cytology was again normal.   PLAN:   I had a long discussion with the patient. I reviewed with her the options. I again discussed the role of bronchoscopy with thoracoscopy possible thoracotomy and talc pleurodesis with pleural biopsy. She understands and would like [place] to proceed.    [name], MD

## 2015-09-30 LAB — PROCALCITONIN: Procalcitonin: <0.1 ng/mL

## 2015-09-30 LAB — CBC
HEMATOCRIT: 33.5 % — AB (ref 35.0–47.0)
HEMOGLOBIN: 11 g/dL — AB (ref 12.0–16.0)
MCH: 31.3 pg (ref 26.0–34.0)
MCHC: 32.9 g/dL (ref 32.0–36.0)
MCV: 95.2 fL (ref 80.0–100.0)
Platelets: 360 10*3/uL (ref 150–440)
RBC: 3.52 MIL/uL — AB (ref 3.80–5.20)
RDW: 12.9 % (ref 11.5–14.5)
WBC: 12.4 10*3/uL — ABNORMAL HIGH (ref 3.6–11.0)

## 2015-09-30 LAB — CULTURE, BODY FLUID-BOTTLE: CULTURE: NO GROWTH

## 2015-09-30 LAB — CULTURE, BLOOD (ROUTINE X 2)
CULTURE: NO GROWTH
Culture: NO GROWTH

## 2015-09-30 LAB — CULTURE, BODY FLUID W GRAM STAIN -BOTTLE

## 2015-09-30 MED ORDER — ALBUTEROL SULFATE (2.5 MG/3ML) 0.083% IN NEBU
2.5000 mg | INHALATION_SOLUTION | RESPIRATORY_TRACT | Status: DC
  Administered 2015-09-30 – 2015-10-01 (×3): 2.5 mg via RESPIRATORY_TRACT
  Filled 2015-09-30 (×4): qty 3

## 2015-09-30 NOTE — Anesthesia Postprocedure Evaluation (Signed)
Anesthesia Post Note  Patient: Kathryn Garrett  Procedure(s) Performed: Procedure(s) (LRB): PREOP BRONCHOSCOPY, LEFT THORACOSCOPY, POSSIBLE THORACOTOMY, PLEURAL BIOPSY, TALC (Left)  Patient location during evaluation: PACU Anesthesia Type: General Level of consciousness: awake and alert Pain management: pain level controlled Vital Signs Assessment: post-procedure vital signs reviewed and stable Respiratory status: spontaneous breathing, nonlabored ventilation, respiratory function stable and patient connected to nasal cannula oxygen Cardiovascular status: blood pressure returned to baseline and stable Postop Assessment: no signs of nausea or vomiting Anesthetic complications: no    Last Vitals:  Filed Vitals:   09/29/15 2107 09/30/15 0455  BP: 115/41 110/50  Pulse: 107 101  Temp: 37 C 38.1 C  Resp: 18 18    Last Pain:  Filed Vitals:   09/30/15 0530  PainSc: 2                  Martha Clan

## 2015-09-30 NOTE — Progress Notes (Signed)
Did well overnight.  Not short of breath.  No air leak seen and minimal drainage from PleurX catheter  Wounds are clean and dry  Will place to water seal today.  Repeat CXRay in morning.  If OK, may cap PleurX catheter and discharge with patient and family instructions on PleurX management.  [employer].

## 2015-09-30 NOTE — Progress Notes (Signed)
Patient afebrile throughout shift until this AM vitals.  Temperature recorded at 100.5.  Paged Dr. Marcille Blanco who advised to give Tylenol and continue to monitor.

## 2015-09-30 NOTE — Progress Notes (Signed)
[facility]FECTI[facility] PROGRESS NOTE Date of Admission:  09/24/2015     ID: Kathryn Garrett is a 70 y.o. female with pleural effusion  Principal Problem:   Sepsis ([place]) Active Problems:   Pleural effusion on left   CAP (community acquired pneumonia)   CAD (coronary artery disease)   HTN (hypertension)   Pleural effusion   Subjective: Feels ok since surgery yest, no fevers, still on O2  ROS  Eleven systems are reviewed and negative except per hpi  Medications:  Antibiotics Given (last 72 hours)    Date/Time Action Medication Dose Rate   09/28/15 1611 Given   levofloxacin (LEVAQUIN) IVPB 500 mg 500 mg 100 mL/hr   09/29/15 1141 Given   levofloxacin (LEVAQUIN) IVPB 500 mg 500 mg    09/29/15 1535 Given  [given in OR]   levofloxacin (LEVAQUIN) IVPB 500 mg 500 mg 100 mL/hr     . albuterol  2.5 mg Nebulization Q4H while awake  . bisacodyl  10 mg Oral Daily  . docusate sodium  100 mg Oral BID  . levofloxacin (LEVAQUIN) IV  500 mg Intravenous Q24H  . metoprolol tartrate  12.5 mg Oral BID  . senna  1 tablet Oral Daily  . sodium chloride flush  3 mL Intravenous Q12H  . talc  5 g Intrapleural Once    Objective: Vital signs in last 24 hours: Temp:  [97.5 F (36.4 C)-100.5 F (38.1 C)] 98.3 F (36.8 C) (05/18 0838) Pulse Rate:  [81-107] 99 (05/18 0838) Resp:  [13-24] 20 (05/18 0838) BP: (110-155)/(41-74) 112/47 mmHg (05/18 0838) SpO2:  [92 %-100 %] 94 % (05/18 0838) Constitutional: oriented to person, place, and time. appears well-developed and well-nourished. No distress.  HENT: [place]/AT, PERRLA, no scleral icterus Mouth/Throat: Oropharynx is clear and moist. No oropharyngeal exudate.  Cardiovascular: Normal rate, regular rhythm and normal heart sounds. Exam reveals no gallop and no friction rub.  No murmur heard.  Pulmonary/Chest: dec BSR base Neck = supple, no nuchal rigidity Abdominal: Soft. Bowel sounds are normal. exhibits no distension. There is no  tenderness.  Lymphadenopathy: no cervical adenopathy. No axillary adenopathy Neurological: alert and oriented to person, place, and time.  Skin: Skin is warm and dry. No rash noted. No erythema.  Psychiatric: a normal mood and affect. behavior is normal.   Lab Results  Recent Labs  09/28/15 1806 09/29/15 0400 09/30/15 0413  WBC  --  11.8* 12.4*  HGB  --  11.8* 11.0*  HCT  --  34.6* 33.5*  NA 136  --   --   K 3.8  --   --   CL 103  --   --   CO2 27  --   --   BUN 15  --   --   CREATININE 0.70  --   --     Microbiology: Results for orders placed or performed during the hospital encounter of 09/24/15  Blood culture (routine x 2)     Status: None (Preliminary result)   Collection Time: 09/24/15 10:09 PM  Result Value Ref Range Status   Specimen Description BLOOD RIGHT ASSIST CONTROL  Final   Special Requests   Final    BOTTLES DRAWN AEROBIC AND ANAEROBIC 14CCAERO,15CCANA   Culture NO GROWTH 4 DAYS  Final   Report Status PENDING  Incomplete  Blood culture (routine x 2)     Status: None (Preliminary result)   Collection Time: 09/24/15 10:09 PM  Result Value Ref Range Status  Specimen Description BLOOD LEFT HAND  Final   Special Requests BOTTLES DRAWN AEROBIC AND ANAEROBIC [place]  Final   Culture NO GROWTH 4 DAYS  Final   Report Status PENDING  Incomplete  Culture, body fluid-bottle     Status: None (Preliminary result)   Collection Time: 09/25/15  2:00 PM  Result Value Ref Range Status   Specimen Description PLEURAL  Final   Special Requests NONE  Final   Gram Stain   Final    WBC SEEN WBC PRESENT,BOTH PMN AND MONONUCLEAR NO ORGANISMS SEEN CYTOSPIN SMEAR    Culture NO GROWTH 3 DAYS  Final   Report Status PENDING  Incomplete  Tissue culture     Status: None (Preliminary result)   Collection Time: 09/29/15 11:41 AM  Result Value Ref Range Status   Specimen Description BIOPSY  Final   Special Requests NONE  Final   Gram Stain   Final    FEW WBC SEEN FEW  RED BLOOD CELLS NO ORGANISMS SEEN    Culture NO GROWTH < 24 HOURS  Final   Report Status PENDING  Incomplete  Culture, body fluid-bottle     Status: None (Preliminary result)   Collection Time: 09/29/15 11:51 AM  Result Value Ref Range Status   Specimen Description PLEURAL  Final   Special Requests NONE  Final   Gram Stain   Final    WBC PRESENT,BOTH PMN AND MONONUCLEAR NO ORGANISMS SEEN CYTOSPIN SMEAR    Culture PENDING  Incomplete   Report Status PENDING  Incomplete    Studies/Results: Dg Chest Port 1 View  09/29/2015  CLINICAL DATA:  Postop chest tube. EXAM: PORTABLE CHEST 1 VIEW COMPARISON:  09/27/2015 FINDINGS: The cardiac silhouette is mildly enlarged. A small caliber left pleural catheter has been placed and courses from the lung base superiorly, terminating near the apex. Left pleural effusion has greatly decreased in size, with small volume fluid remaining. There is improved aeration of the left lung base with mild parenchymal opacity likely reflecting atelectasis. No pneumothorax or acute osseous abnormality is identified. IMPRESSION: 1. Interval left pleural catheter placement with small volume residual pleural effusion, greatly decreased from prior. 2. Mild left basilar atelectasis. Electronically Signed   By: Logan Bores M.D.   On: 09/29/2015 13:30    Assessment/Plan: Kathryn Garrett is a 70 y.o. female with what seems to be a para pneumonic effusion and focal consolidation following a resp illness. Cultures are negative but significant WBC noted on fluid and on gram stain. She had received 3 days azitrho and a few of amoxicillin as otpt. Now on levofloxacin On 5/17 s/Garrett bronchoscopy and thoracoscopy with removal 500 cc fluid and finding of multiple 5-6 mm nodules on pleural space and lung surface.  Bxps done and more samples sent for culture  Recommendations Continue levo for a 7 day course Cultures for routine and fungal are pending on the fluid and tissue.  I have asked  micro to add on AFB cultures as well to tissue and fluid (denies TB risk factors)   Thank you very much for the consult. Will follow with you.  Kathryn Garrett   09/30/2015, 10:39 AM

## 2015-09-30 NOTE — Plan of Care (Signed)
Problem: Activity: Goal: Ability to tolerate increased activity will improve Outcome: Progressing Able to move in  Bed well.  Problem: Pain Management: Goal: Expressions of feelings of enhanced comfort will increase Outcome: Progressing Prn po x1 for c/o back pain with  improvement  Problem: Skin Integrity: Goal: Risk for impaired skin integrity will decrease Outcome: Progressing Rash on body remains but  improved  Problem: Tissue Perfusion: Goal: Risk factors for ineffective tissue perfusion will decrease Outcome: Progressing Chest tube intact and draining  serosy drainage today

## 2015-09-30 NOTE — Care Management Important Message (Signed)
Important Message  Patient Details  Name: Kathryn Garrett MRN: [id number] Date of Birth: Apr 19, 1946   Medicare Important Message Given:  Yes    Katrina Stack, RN 09/30/2015, 9:51 AM

## 2015-09-30 NOTE — Care Management (Signed)
Had anticipated that patient was going to require Pleurx cath drainage system but informed by attending that now the plan is most likely the tube will be clamped tomorrow and do not anticipate need for the drainage system.  Have completed a referral form and placed it in the medical record. Patient wishes to think about whether she will need home health nursing.  Her need for 02 is acute and will need assessment of need for home 02.

## 2015-10-01 ENCOUNTER — Inpatient Hospital Stay: Payer: Medicare HMO

## 2015-10-01 LAB — CREATININE, SERUM
Creatinine, Ser: 0.71 mg/dL (ref 0.44–1.00)
GFR calc non Af Amer: >60 mL/min (ref >60)

## 2015-10-01 LAB — SURGICAL PATHOLOGY

## 2015-10-01 LAB — CYTOLOGY - NON PAP

## 2015-10-01 IMAGING — CR DG CHEST 2V
1 series · 2 of 2 positions shown · non-contrast
Comparison: [DATE]

CLINICAL DATA: Postop

EXAM:
CHEST  2 VIEW

[Series 1: dg chest 2 view · 0.14mm/px · 2 of 2 slices shown]
[im 1/2]
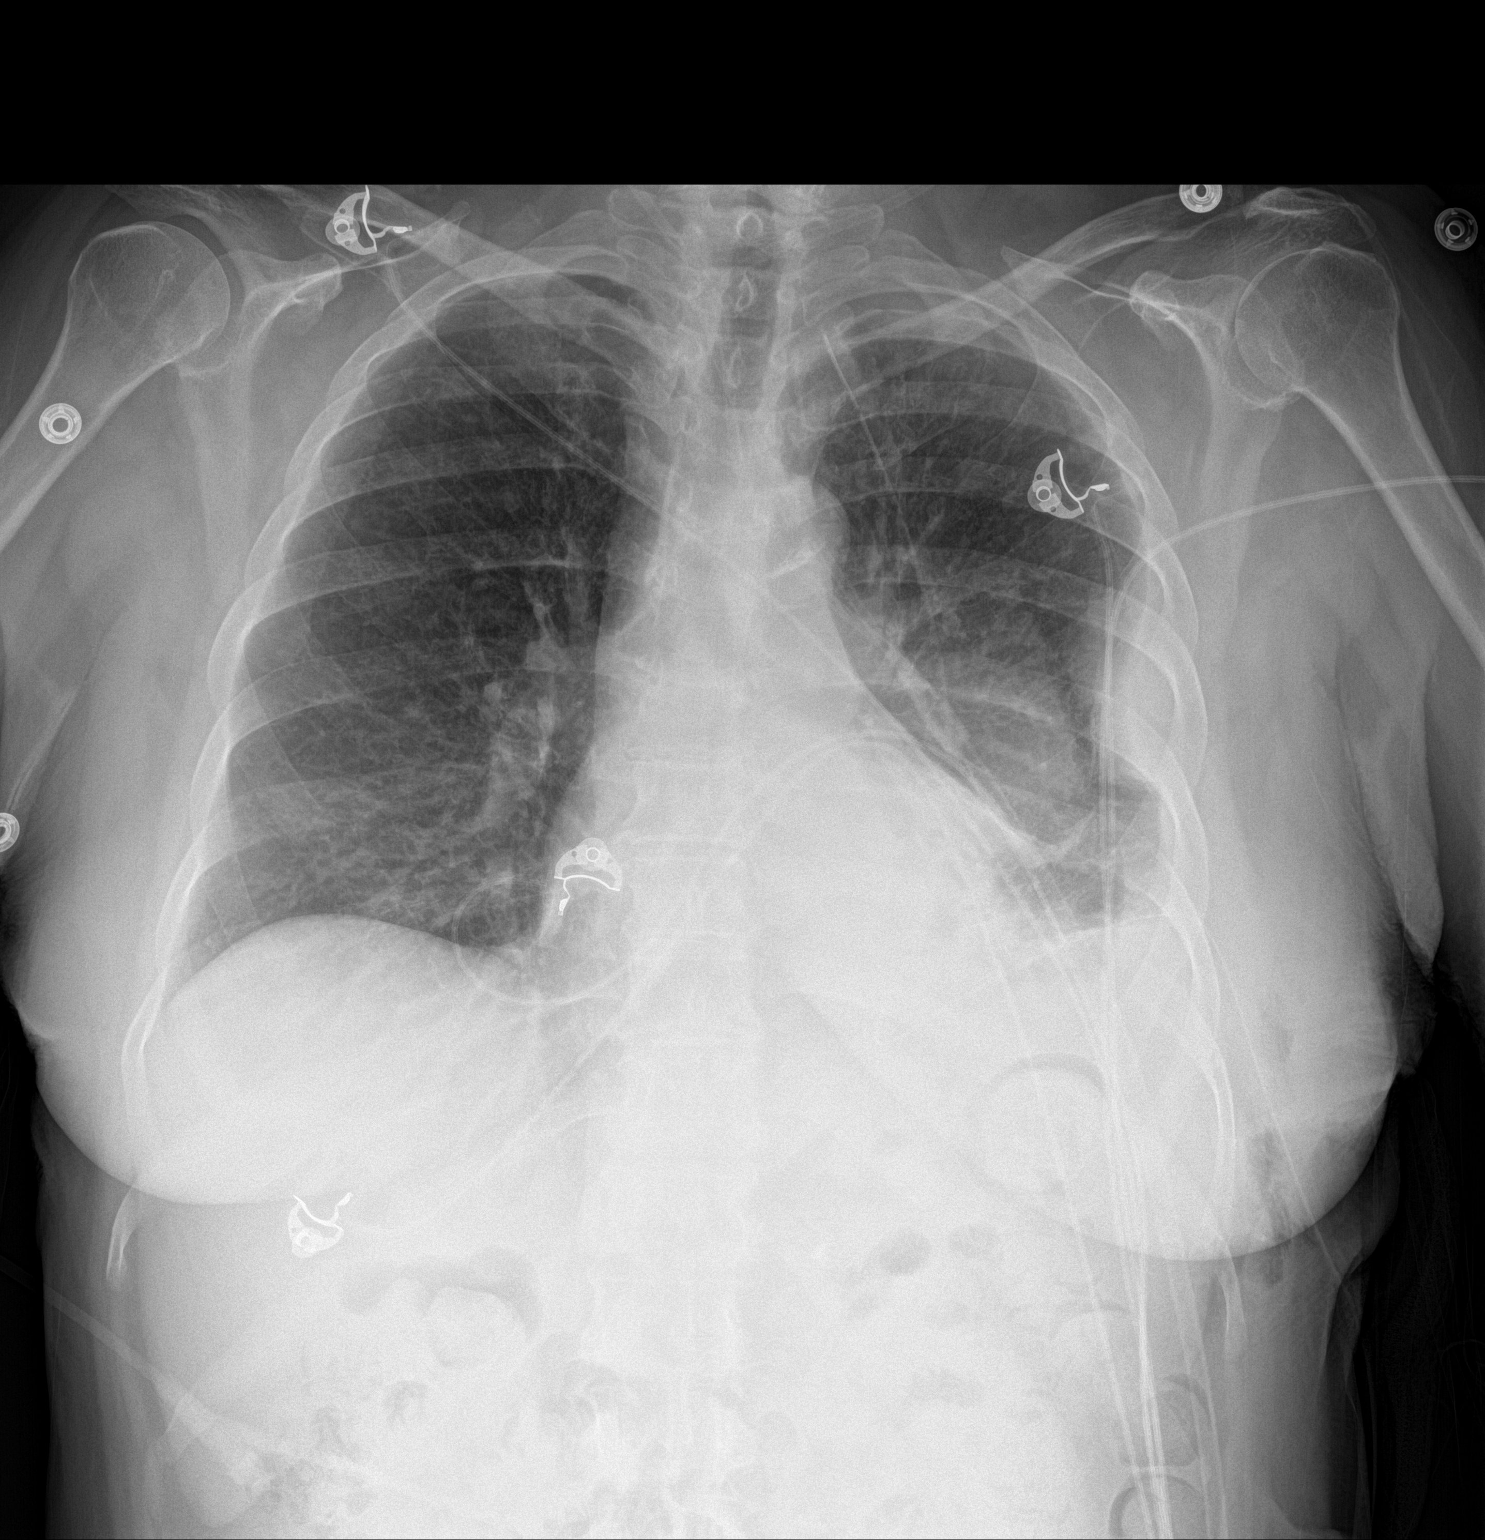
[im 2/2]
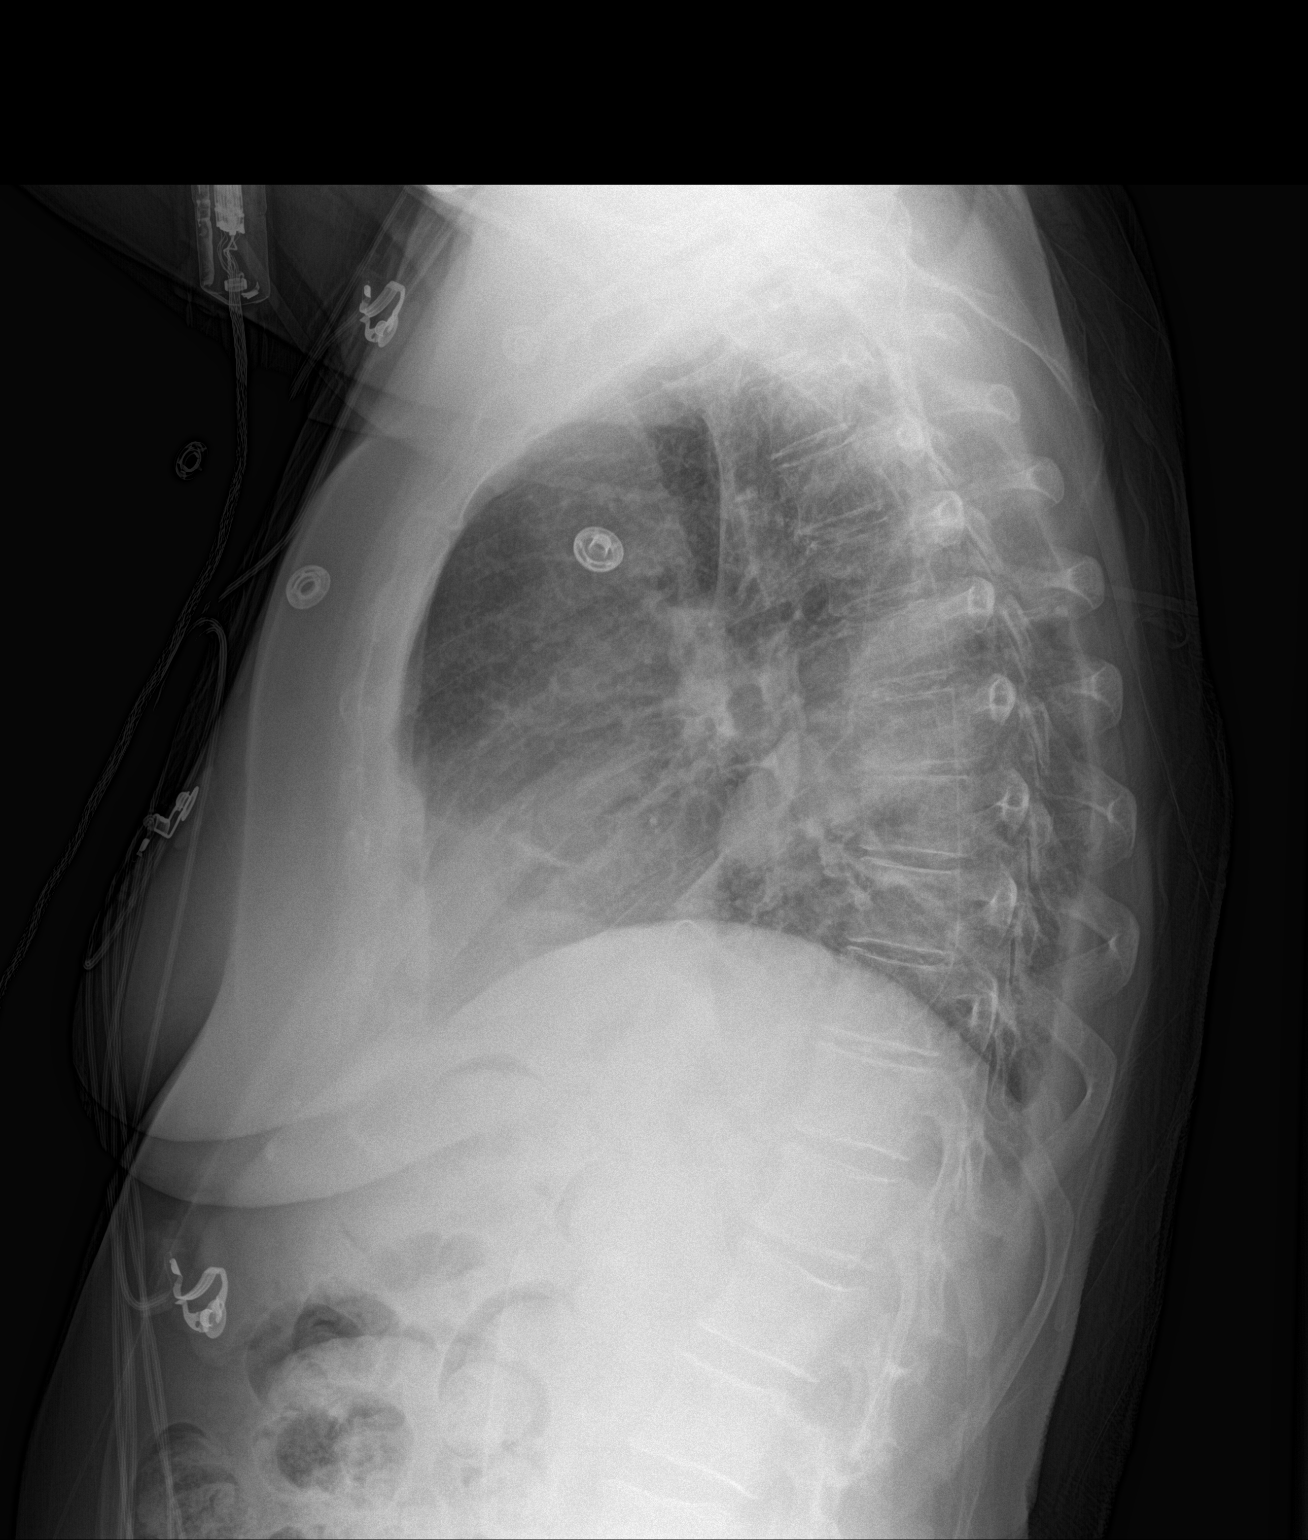

[2 of 2 positions shown; findings below may reference images not displayed]

FINDINGS: Mild cardiomegaly. Consolidation at the left base is stable. Left
pleural effusion has increased. There may be a loculated portion in
the lateral mid lung zone. Right lung is clear. Left chest tube
remains in place and there is no pneumothorax.
IMPRESSION: Stable left basilar consolidation.

Increasing left pleural effusion

No pneumothorax.  Stable left chest tube.

## 2015-10-01 MED ORDER — OXYCODONE HCL 5 MG PO TABS: 5.0000 mg | ORAL_TABLET | ORAL | Status: DC | PRN

## 2015-10-01 MED ORDER — ALBUTEROL SULFATE (2.5 MG/3ML) 0.083% IN NEBU: 2.5000 mg | INHALATION_SOLUTION | Freq: Three times a day (TID) | RESPIRATORY_TRACT | Status: DC

## 2015-10-01 MED ORDER — LEVOFLOXACIN 500 MG PO TABS: 500.0000 mg | ORAL_TABLET | Freq: Every day | ORAL | Status: DC

## 2015-10-01 NOTE — Progress Notes (Signed)
Pt to be discharged home. Alert. No resp distress.  02 weaned to room air. Pt sats 92-93 on room air.  pleuryx cath  Capped chest tube d/cd. Tol.  Instructions discussed with pt and dtr.presc  Given and discussed. Home meds discussed and diet and f/u discussed.verbalizes understanding.

## 2015-10-01 NOTE — Care Management (Signed)
Received Rx for Pleur-X drainage at discharge. Faxed prescription and all insurance info to [phone] at the direction of Pleur-x specialist. They informed this CM that some one will contact the family within 24 hours and supplies will be shipped within 3 business days. No further CM needs at present. Will continue to be available should additional needs arise. Will sign off for now.

## 2015-10-01 NOTE — Progress Notes (Signed)
ANTIBIOTIC CONSULT NOTE - Follow up  Pharmacy Consult for Levaquin  Indication: pneumonia  Allergies  Allergen Reactions  . Azithromycin Rash    Patient Measurements: Height: 4\' 11"  (149.9 cm) Weight: 136 lb 9.6 oz (61.961 kg) IBW/kg (Calculated) : 43.2  Vital Signs: Temp: 98.8 F (37.1 C) (05/19 0817) Temp Source: Oral (05/19 0817) BP: 133/46 mmHg (05/19 0817) Pulse Rate: 92 (05/19 0817) Intake/Output from previous day: 05/18 0701 - 05/19 0700 In: 3226.5 [P.O.:820; I.V.:2406.5] Out: 1995 [id number]; Chest Tube:45] Intake/Output from this shift:    Labs:  Recent Labs  09/28/15 1806 09/29/15 0400 09/30/15 0413 10/01/15 0408  WBC  --  11.8* 12.4*  --   HGB  --  11.8* 11.0*  --   PLT  --  359 360  --   CREATININE 0.70  --   --  0.71   Estimated Creatinine Clearance: 52.4 mL/min (by C-G formula based on Cr of 0.71). No results for input(s): VANCOTROUGH, VANCOPEAK, VANCORANDOM, GENTTROUGH, GENTPEAK, GENTRANDOM, TOBRATROUGH, TOBRAPEAK, TOBRARND, AMIKACINPEAK, AMIKACINTROU, AMIKACIN in the last 72 hours.   Microbiology: Recent Results (from the past 720 hour(s))  Blood culture (routine x 2)     Status: None   Collection Time: 09/24/15 10:09 PM  Result Value Ref Range Status   Specimen Description BLOOD RIGHT ASSIST CONTROL  Final   Special Requests   Final    BOTTLES DRAWN AEROBIC AND ANAEROBIC 14CCAERO,15CCANA   Culture NO GROWTH 6 DAYS  Final   Report Status 09/30/2015 FINAL  Final  Blood culture (routine x 2)     Status: None   Collection Time: 09/24/15 10:09 PM  Result Value Ref Range Status   Specimen Description BLOOD LEFT HAND  Final   Special Requests BOTTLES DRAWN AEROBIC AND ANAEROBIC [place]  Final   Culture NO GROWTH 6 DAYS  Final   Report Status 09/30/2015 FINAL  Final  Culture, body fluid-bottle     Status: None   Collection Time: 09/25/15  2:00 PM  Result Value Ref Range Status   Specimen Description PLEURAL  Final   Special Requests  NONE  Final   Gram Stain   Final    WBC SEEN WBC PRESENT,BOTH PMN AND MONONUCLEAR NO ORGANISMS SEEN CYTOSPIN SMEAR    Culture NO GROWTH 5 DAYS  Final   Report Status 09/30/2015 FINAL  Final  CULTURE, BLOOD (ROUTINE X 2) w Reflex to PCR ID Panel     Status: None (Preliminary result)   Collection Time: 09/28/15  9:26 AM  Result Value Ref Range Status   Specimen Description BLOOD RIGHT AC  Final   Special Requests   Final    BOTTLES DRAWN AEROBIC AND ANAEROBIC AER 6ML ANA 2ML   Culture NO GROWTH 2 DAYS  Final   Report Status PENDING  Incomplete  CULTURE, BLOOD (ROUTINE X 2) w Reflex to PCR ID Panel     Status: None (Preliminary result)   Collection Time: 09/28/15  9:26 AM  Result Value Ref Range Status   Specimen Description BLOOD LEFT AC  Final   Special Requests   Final    BOTTLES DRAWN AEROBIC AND ANAEROBIC AER 9ML ANA 4ML   Culture NO GROWTH 2 DAYS  Final   Report Status PENDING  Incomplete  Tissue culture     Status: None (Preliminary result)   Collection Time: 09/29/15 11:41 AM  Result Value Ref Range Status   Specimen Description BIOPSY  Final   Special Requests NONE  Final   Gram  Stain   Final    FEW WBC SEEN FEW RED BLOOD CELLS NO ORGANISMS SEEN    Culture NO GROWTH < 24 HOURS  Final   Report Status PENDING  Incomplete  Culture, fungus without smear ([facility]-Only)     Status: None (Preliminary result)   Collection Time: 09/29/15 11:41 AM  Result Value Ref Range Status   Specimen Description BIOPSY  Final   Special Requests NONE  Final   Culture NO FUNGUS ISOLATED AFTER 1 DAY  Final   Report Status PENDING  Incomplete  Culture, body fluid-bottle     Status: None (Preliminary result)   Collection Time: 09/29/15 11:51 AM  Result Value Ref Range Status   Specimen Description PLEURAL  Final   Special Requests NONE  Final   Gram Stain   Final    WBC PRESENT,BOTH PMN AND MONONUCLEAR NO ORGANISMS SEEN CYTOSPIN SMEAR    Culture NO GROWTH 1 DAY  Final   Report Status  PENDING  Incomplete   Assessment: 70 yo female on day 4/7 of levaquin for CAP CrCl = 52.4 ml/min   Goal of Therapy:  resolution of infection  Plan:  Expected duration 7 days with resolution of temperature and/or normalization of WBC   Will continue Levaquin 500 mg IV Q24H currently ordered.  No dose adjustment needed. Per ID MD note planning for 7 day course.   Rayna Sexton L 10/01/2015,9:18 AM

## 2015-10-01 NOTE — Progress Notes (Signed)
[facility] [place] NAME: Kathryn Garrett    MR#:  [id number]  DATE OF BIRTH:  Aug 04, 1945  SUBJECTIVE:  CHIEF COMPLAINT:   Chief Complaint  Patient presents with  . Pleurisy  Patient is 70 year old Caucasian female with past history significant for history of coronary artery disease, CHF, hypertension, who presents to the hospital with complaints of shortness of breath, left-sided chest pains. Apparently patient was diagnosed with bronchitis couple weeks ago, given azithromycin, however, over the past few days Prior to coming to the hospital she's been feeling poorly, having chills, nonproductive cough, left-sided chest pain. She was seen in emergency room, where she was noted to be tachycardic, had leukocytosis on lab studies, CT scan revealed pleural effusion. Patient was admitted for therapy due to concerns of empyema. The patient underwent thoracentesis 09/26/2015, pain has improved somewhat, less shortness of breath. Pleural fluid studies revealed elevated white blood cell count, LDH, but normal glucose level. Cultures did not show an any growth so far.. Cytology of pleural fluid is pending. Patient underwent preoperative bronchoscopy, left thoracoscopy with pleural biopsy, insertion of Pleurx catheter by  Dr. Genevive Bi  17th of May 2017. Feels satisfactory, denies any significant pain, catheter is on water seal now.  Review of Systems  Constitutional: Negative for fever, chills and malaise/fatigue.  HENT: Negative for ear discharge, sore throat and tinnitus.   Eyes: Negative for blurred vision and pain.  Respiratory: Negative for cough, hemoptysis, shortness of breath and wheezing.   Cardiovascular: Negative for chest pain, palpitations and leg swelling.  Gastrointestinal: Negative for nausea, vomiting, abdominal pain, diarrhea and melena.  Genitourinary: Negative for urgency, frequency and flank pain.  Musculoskeletal: Negative for myalgias,  back pain and joint pain.  Skin: Negative for itching and rash.  Neurological: Negative for dizziness, tremors, speech change, focal weakness and headaches.  Psychiatric/Behavioral: Negative for depression and suicidal ideas.    VITAL SIGNS: Blood pressure 93/31, pulse 94, temperature 98.6 F (37 C), temperature source Oral, resp. rate 18, height 4\' 11"  (1.499 m), weight 61.961 kg (136 lb 9.6 oz), SpO2 94 %.  PHYSICAL EXAMINATION:   GENERAL:  70 y.o.-year-old patient lying in the bed, not in distress , comfortable today, but sleepy  EYES: Pupils equal, round, reactive to light and accommodation. No scleral icterus. Extraocular muscles intact.  HEENT: Head atraumatic, normocephalic. Oropharynx and nasopharynx clear.  NECK:  Supple, no jugular venous distention. No thyroid enlargement, no tenderness.  LUNGS: Better air entranceon the left ,  normal air entrance on the right, no wheezing, rales,rhonchi or crepitation. No use of accessory muscles of respiration. Pleurx catheter is draining minimal amount of bloody fluid  CARDIOVASCULAR: S1, S2 normal. No murmurs, rubs, or gallops.  ABDOMEN: Soft, nontender, nondistended. Bowel sounds present. No organomegaly or mass.  EXTREMITIES: No pedal edema, cyanosis, or clubbing.  NEUROLOGIC: Cranial nerves II through XII are intact. Muscle strength 5/5 in all extremities. Sensation intact. Gait not checked.  PSYCHIATRIC: The patient is somnolent, difficult to assess orientation.  SKIN: No obvious rash, lesion, or ulcer.   ORDERS/RESULTS REVIEWED:   CBC  Recent Labs Lab 09/24/15 1807 09/25/15 0438 09/28/15 0336 09/29/15 0400 09/30/15 0413  WBC 14.0* 12.1* 13.9* 11.8* 12.4*  HGB 13.7 12.2 12.4 11.8* 11.0*  HCT 41.8 37.0 36.5 34.6* 33.5*  PLT 356 340 353 359 360  MCV 95.4 94.4 92.4 92.4 95.2  MCH 31.3 31.2 31.3 31.4 31.3  MCHC 32.8 33.1 33.9 34.0 32.9  RDW 13.4 13.2 13.1 13.0 12.9    ------------------------------------------------------------------------------------------------------------------  Chemistries   Recent Labs Lab 09/24/15 1807 09/25/15 0438 09/28/15 1806 10/01/15 0408  NA 140 139 136  --   K 4.3 4.0 3.8  --   CL 109 110 103  --   CO2 20* 26 27  --   GLUCOSE 92 119* 133*  --   BUN 20 15 15   --   CREATININE 0.65 0.66 0.70 0.71  CALCIUM 9.1 8.8* 8.2*  --   AST 19 14* 15  --   ALT 12* 11* 11*  --   ALKPHOS 73 70 58  --   BILITOT 0.3 0.3 0.3  --    ------------------------------------------------------------------------------------------------------------------ estimated creatinine clearance is 52.4 mL/min (by C-G formula based on Cr of 0.71). ------------------------------------------------------------------------------------------------------------------ No results for input(s): TSH, T4TOTAL, T3FREE, THYROIDAB in the last 72 hours.  Invalid input(s): FREET3  Cardiac Enzymes  Recent Labs Lab 09/24/15 1807  TROPONINI <0.03   ------------------------------------------------------------------------------------------------------------------ Invalid input(s): POCBNP ---------------------------------------------------------------------------------------------------------------  RADIOLOGY: Dg Chest Port 1 View  09/29/2015  CLINICAL DATA:  Postop chest tube. EXAM: PORTABLE CHEST 1 VIEW COMPARISON:  09/27/2015 FINDINGS: The cardiac silhouette is mildly enlarged. A small caliber left pleural catheter has been placed and courses from the lung base superiorly, terminating near the apex. Left pleural effusion has greatly decreased in size, with small volume fluid remaining. There is improved aeration of the left lung base with mild parenchymal opacity likely reflecting atelectasis. No pneumothorax or acute osseous abnormality is identified. IMPRESSION: 1. Interval left pleural catheter placement with small volume residual pleural effusion, greatly  decreased from prior. 2. Mild left basilar atelectasis. Electronically Signed   By: Logan Bores M.D.   On: 09/29/2015 13:30    EKG:  Orders placed or performed during the hospital encounter of 09/24/15  . ED EKG  . ED EKG  . EKG 12-Lead  . EKG 12-Lead    ASSESSMENT AND PLAN:  Principal Problem:   Sepsis ([place]) Active Problems:   Pleural effusion on left   CAP (community acquired pneumonia)   CAD (coronary artery disease)   HTN (hypertension)   Pleural effusion  #1. Sepsis, was felt due to Community-acquired pneumonia and parapneumonic effusion/empyema, continue levofloxacin for 7 day course per Dr. Blane Ohara recommendations , cultures were negative, status post left thoracentesis 09/25/2015. Pleural fluid as well as Blood cultures are negative so far, sputum culture is pending. Pleural fluid cytology is negative for malignancy. White blood cell count improved to 11.8 today after levofloxacin was started. Repeated chest x-ray showed some fluid reaccumulation. Patient was seen by thoracic surgeon, Dr. Genevive Bi, patient underwent thoracoscopy  with pleural biopsy, Pleurx catheter placement, perioperative bronchoscopy. Continue supportive care.  Appreciate Dr. Genevive Bi and Dr. Blane Ohara input Now catheter is underwater seal, and if stable - may d/c home tomorrow with pleurax care. #2. Left-sided Pleural effusion, which was suspected due to pneumonia/parapneumonic effusion, status post left thoracentesis 09/25/2015,   cultures are negative, . Pleural fluid cytology is negative for malignancy. Patient underwent pleural biopsy today, results pending  ID added fungal and AFB cultures to the samples. #3. Metabolic acidosis, as based on basic metabolic panel on admission, no lactic acidosis, improved , following closely #4. Leukocytosis, improved today on levofloxacin, Dr. Ola Spurr recommends to continue antibiotic for 7 days #5. Essential hypertension, now patient is relatively hypotensive, now on  decreased metoprolol dose #6. Sinus tachycardia, resolved with IV fluid administration, supportive therapy, continue lower dose of metoprolol #7. Community acquired  bacterial pneumonia  . Procalcitonin level is low at less than 0.1, however, infectious disease specialist, Dr. Ola Spurr recommended antibiotic therapy for 7 day, continue levofloxacin for 7 day therapy, awaiting for pleural biopsy results. Pleural fluid cytology is negative for malignancy.   Management plans discussed with the patient, family and they are in agreement.   DRUG ALLERGIES:  Allergies  Allergen Reactions  . Azithromycin Rash    CODE STATUS:     Code Status Orders        Start     Ordered   09/24/15 2246  Full code   Continuous     09/24/15 2245    Code Status History    Date Active Date Inactive Code Status Order ID Comments User Context   This patient has a current code status but no historical code status.      TOTAL TIME TAKING CARE OF THIS PATIENT: 35 minutes.   Discussed with Patient, family, all questions were answered, voiced understanding. Possible d/c tomorrow, CM to help arrange Catheter care at home.  Vaughan Basta M.D on 10/01/2015 at 7:37 AM  Between 7am to 6pm - Pager - [phone]  After 6pm go to [email or web address] - password [email or web address]  [facility]  Office  [phone]  CC: Primary care physician; No primary care provider on file.

## 2015-10-02 LAB — TISSUE CULTURE: CULTURE: NO GROWTH

## 2015-10-03 LAB — CULTURE, BLOOD (ROUTINE X 2)
CULTURE: NO GROWTH
Culture: NO GROWTH

## 2015-10-03 LAB — CULTURE, BODY FLUID-BOTTLE: CULTURE: NO GROWTH

## 2015-10-03 LAB — ANAEROBIC CULTURE

## 2015-10-03 LAB — CULTURE, BODY FLUID W GRAM STAIN -BOTTLE

## 2015-10-05 ENCOUNTER — Encounter: Payer: Self-pay | Admitting: Cardiothoracic Surgery

## 2015-10-05 ENCOUNTER — Ambulatory Visit
Admission: RE | Admit: 2015-10-05 | Discharge: 2015-10-05 | Disposition: A | Discharge status: Home / Self Care | Payer: Medicare HMO | Source: Ambulatory Visit | Attending: Cardiothoracic Surgery | Admitting: Cardiothoracic Surgery

## 2015-10-05 ENCOUNTER — Other Ambulatory Visit: Payer: Self-pay

## 2015-10-05 ENCOUNTER — Ambulatory Visit (INDEPENDENT_AMBULATORY_CARE_PROVIDER_SITE_OTHER): Payer: Medicare HMO | Admitting: Cardiothoracic Surgery

## 2015-10-05 VITALS — BP 131/78 | HR 99 | Temp 98.6°F | Ht 59.0 in | Wt 133.6 lb

## 2015-10-05 DIAGNOSIS — R918 Other nonspecific abnormal finding of lung field: Secondary | ICD-10-CM

## 2015-10-05 DIAGNOSIS — R0600 Dyspnea, unspecified: Secondary | ICD-10-CM

## 2015-10-05 IMAGING — CR DG CHEST 2V
2 series · 2 of 2 positions shown · non-contrast
Comparison: Chest x-ray of [DATE]

CLINICAL DATA: Status post thoracentesis and thoracoscopy on [DATE]

EXAM:
CHEST  2 VIEW

[chest pa]
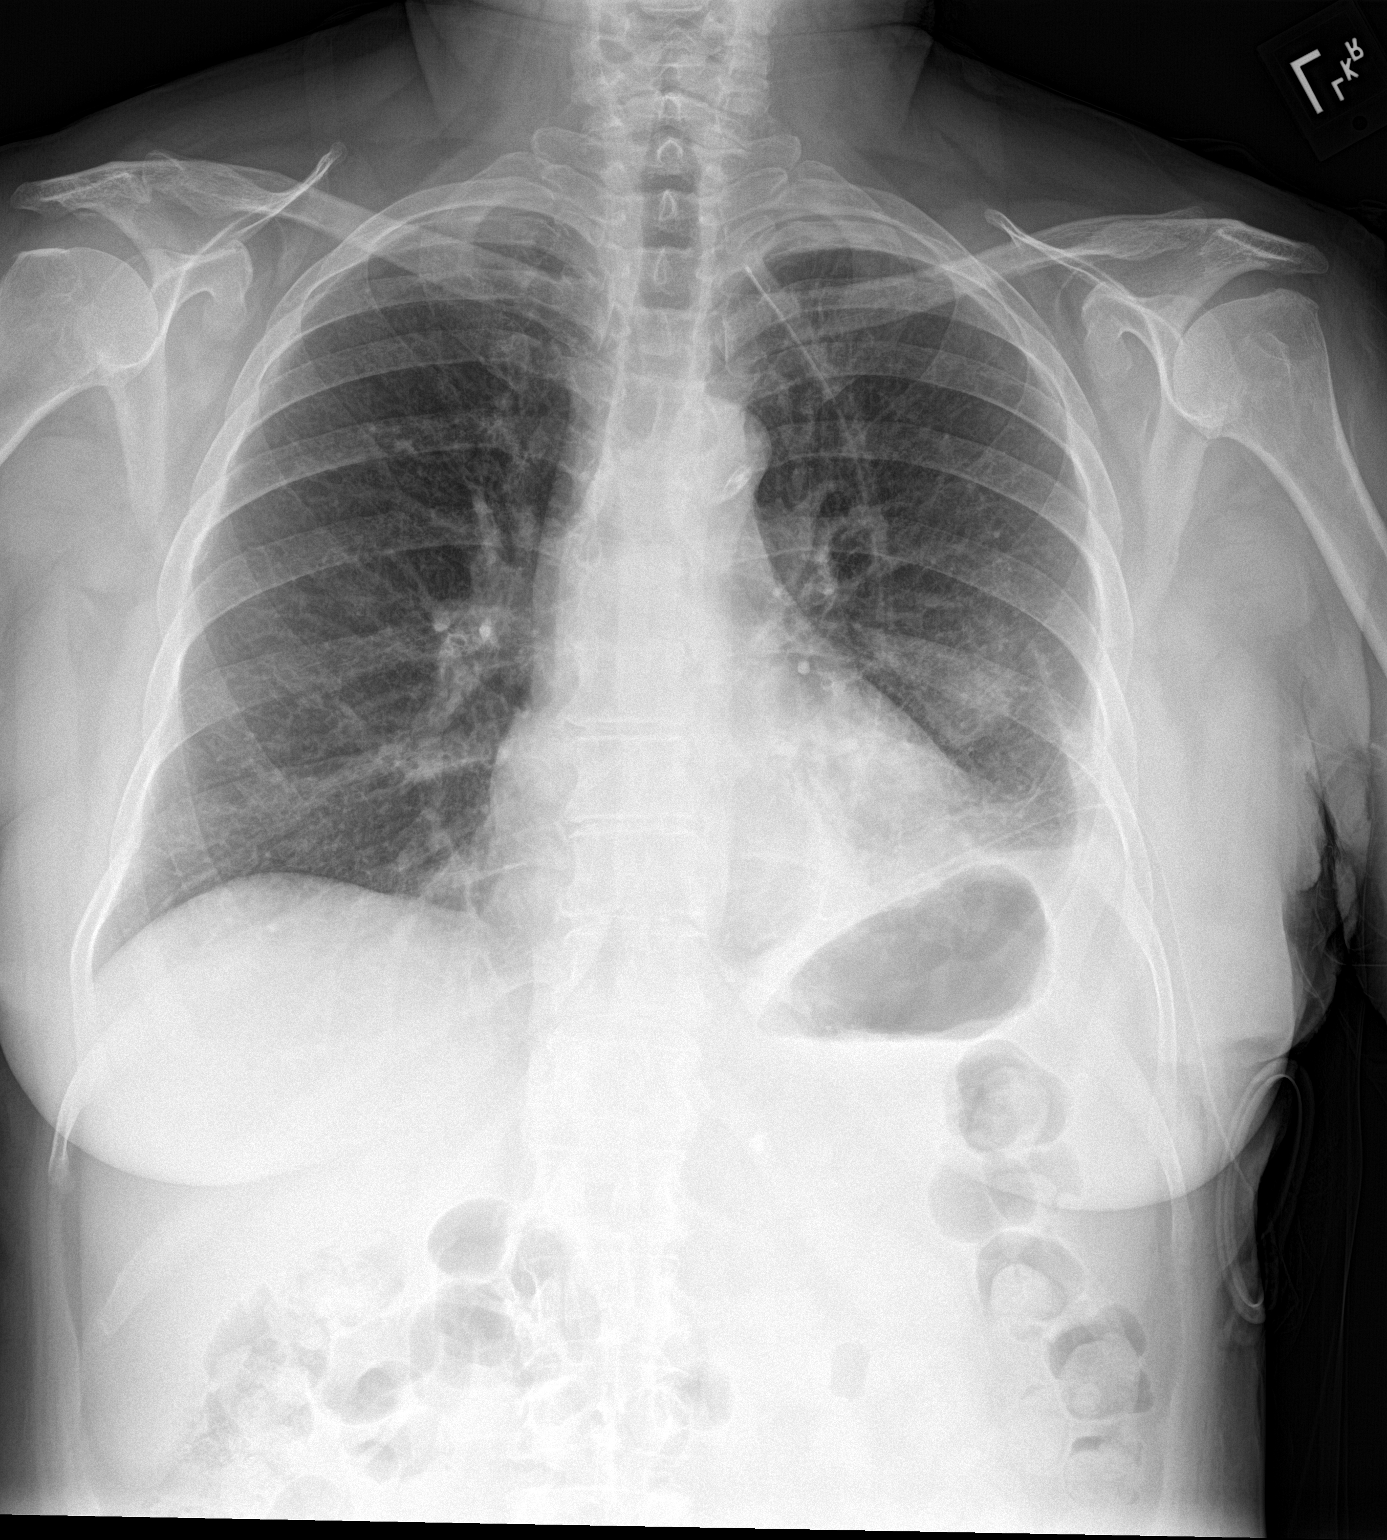

[chest lat]
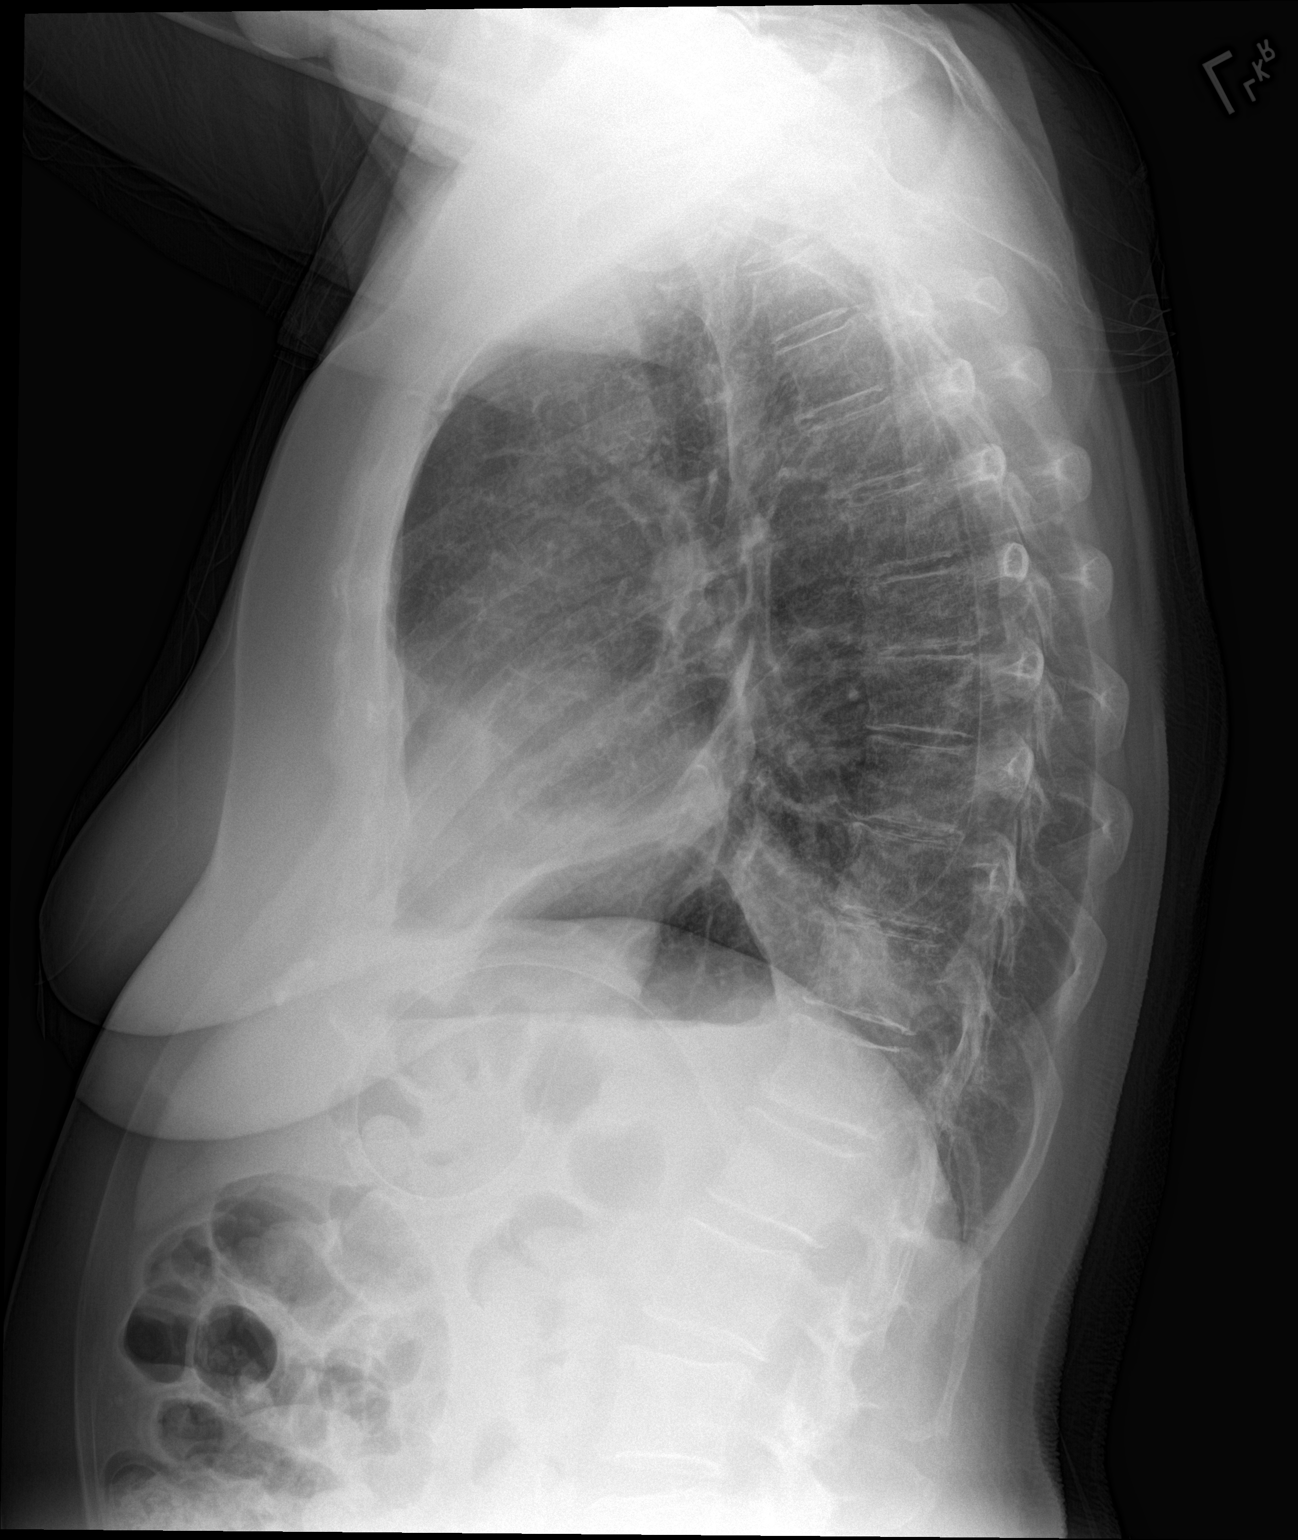

[2 of 2 positions shown; findings below may reference images not displayed]

FINDINGS: There remains a small left pleural effusion. A small caliber
left-sided chest tube is in place with the tip overlying the
posterior medial aspect of the left third rib. There is no
pneumothorax. No right pleural effusion is observed. The
interstitial markings of both lungs remain mildly increased but have
improved. The cardiac silhouette is normal in size. The pulmonary
vascularity is not engorged. The bony thorax exhibits no acute
abnormality.
IMPRESSION: A small amount of residual pleural fluid on the left is present.
Mild interstitial prominence bilaterally is observed. The left chest
tube is in stable position.

## 2015-10-05 NOTE — Progress Notes (Signed)
Kathryn Garrett Inpatient Post-Op Note  Patient ID: Kathryn Garrett, female   DOB: Jul 13, 1945, 70 y.o.   MRN: [id number]  HISTORY: This patient returns today in follow-up. She underwent a left thoracoscopy with pleural biopsy and Pleurx catheter insertion. Our working diagnosis at the time was a malignant pleural effusion. The final result came back from the pleural biopsy and this was negative for malignancy. All showed some inflammatory changes. She was discharged to home about a week ago with a Pleurx catheter in place. She has not change the dressing as she states she was told not remove it. In addition she's not draining the catheter she has no supplies.   Filed Vitals:   10/05/15 1149  BP: 131/78  Pulse: 99  Temp: 98.6 F (37 C)     EXAM: Resp: Lungs are clear bilaterally.  No respiratory distress, normal effort. Heart:  Regular without murmurs Abd:  Abdomen is soft, non distended and non tender. No masses are palpable.  There is no rebound and no guarding.  Neurological: Alert and oriented to person, place, and time. Coordination normal.  Skin: Skin is warm and dry. No rash noted. No diaphoretic. No erythema. No pallor.  her wounds are all clean dry and intact.  Psychiatric: Normal mood and affect. Normal behavior. Judgment and thought content normal.    ASSESSMENT: We did send the patient for chest x-ray. That did not reveal any pleural effusion. There is no infiltrate. There is some blunting of the left costophrenic angle which is postoperative in nature.   PLAN:   I had a long discussion with her regarding the options at this point. There is no sign of malignancy. There is no obvious infection that still present. She denied any fevers or chills. The fluid is present within the tube does not look infected. After long discussion with her she would like to have the catheter removed as there is no evidence of a recurrent pleural effusion. I will set her up for that.    [name], MD

## 2015-10-05 NOTE — Patient Instructions (Signed)
We will send you down for an xray. Please return to the office after your xray.

## 2015-10-06 ENCOUNTER — Telehealth: Payer: Self-pay | Admitting: Cardiothoracic Surgery

## 2015-10-06 NOTE — Telephone Encounter (Signed)
Pt advised of pre op date/time and sx date. Sx: 10/08/15 with Dr Geanie Kenning Cath removal. Pre op: 10/07/15 between 1-5:00pm--Phone.   Patient made aware to call [phone], between 1-3:00pm the day before surgery, to find out what time to arrive.

## 2015-10-07 ENCOUNTER — Other Ambulatory Visit: Payer: Medicare HMO

## 2015-10-07 ENCOUNTER — Encounter: Payer: Self-pay | Admitting: *Deleted

## 2015-10-07 LAB — ACID FAST SMEAR (AFB, MYCOBACTERIA): Acid Fast Smear: NEGATIVE

## 2015-10-07 LAB — ACID FAST CULTURE WITH REFLEXED SENSITIVITIES

## 2015-10-07 LAB — ACID FAST SMEAR (AFB): ACID FAST SMEAR - AFSCU2: NEGATIVE

## 2015-10-07 NOTE — Telephone Encounter (Signed)
I have called patient daughter Amy and advised her of her mothers surgery time to arrive. She stated that she will make sure to tell her.

## 2015-10-07 NOTE — Patient Instructions (Signed)
  Your procedure is scheduled on: 10-08-15  Report to [facility] @ 10:30 AM (PT NOTIFIED OF TIME DURING PHONE INTERVIEW)   Remember: Instructions that are not followed completely may result in serious medical risk, up to and including death, or upon the discretion of your surgeon and anesthesiologist your surgery may need to be rescheduled.    _X___ 1. Do not eat food or drink liquids after midnight. No gum chewing or hard candies.     _X___ 2. No Alcohol for 24 hours before or after surgery.   ____ 3. Bring all medications with you on the day of surgery if instructed.    ____ 4. Notify your doctor if there is any change in your medical condition     (cold, fever, infections).     Do not wear jewelry, make-up, hairpins, clips or nail polish.  Do not wear lotions, powders, or perfumes. You may wear deodorant.  Do not shave 48 hours prior to surgery. Men may shave face and neck.  Do not bring valuables to the hospital.    [facility] is not responsible for any belongings or valuables.               Contacts, dentures or bridgework may not be worn into surgery.  Leave your suitcase in the car. After surgery it may be brought to your room.  For patients admitted to the hospital, discharge time is determined by your treatment team.   Patients discharged the day of surgery will not be allowed to drive home.   Please read over the following fact sheets that you were given:      _X___ Take these medicines the morning of surgery with A SIP OF WATER:    1. METOPROLOL  2. SUBOXONE  3.   4.  5.  6.  ____ Fleet Enema (as directed)   ____ Use CHG Soap as directed  ____ Use inhalers on the day of surgery  ____ Stop metformin 2 days prior to surgery    ____ Take 1/2 of usual insulin dose the night before surgery and none on the morning of surgery.   ____ Stop Coumadin/Plavix/aspirin-N/A  _X___ Stop Anti-inflammatories-NO NSAIDS OR ASA PRODUCTS-TYLENOL OK  TO TAKE TODAY   ____ Stop supplements until after surgery.    ____ Bring C-Pap to the hospital.

## 2015-10-08 ENCOUNTER — Encounter: Payer: Self-pay | Admitting: *Deleted

## 2015-10-08 ENCOUNTER — Ambulatory Visit: Payer: Medicare HMO | Admitting: Anesthesiology

## 2015-10-08 ENCOUNTER — Encounter
Admission: RE | Disposition: A | Discharge status: Home / Self Care | Payer: Self-pay | Source: Ambulatory Visit | Attending: Cardiothoracic Surgery

## 2015-10-08 ENCOUNTER — Encounter: Payer: Self-pay | Admitting: Cardiothoracic Surgery

## 2015-10-08 ENCOUNTER — Ambulatory Visit
Admission: RE | Admit: 2015-10-08 | Discharge: 2015-10-08 | Disposition: A | Discharge status: Home / Self Care | Payer: Medicare HMO | Source: Ambulatory Visit | Attending: Cardiothoracic Surgery | Admitting: Cardiothoracic Surgery

## 2015-10-08 DIAGNOSIS — I509 Heart failure, unspecified: Secondary | ICD-10-CM | POA: Diagnosis not present

## 2015-10-08 DIAGNOSIS — T859XXA Unspecified complication of internal prosthetic device, implant and graft, initial encounter: Secondary | ICD-10-CM | POA: Diagnosis not present

## 2015-10-08 DIAGNOSIS — R918 Other nonspecific abnormal finding of lung field: Secondary | ICD-10-CM | POA: Diagnosis not present

## 2015-10-08 DIAGNOSIS — I252 Old myocardial infarction: Secondary | ICD-10-CM | POA: Insufficient documentation

## 2015-10-08 DIAGNOSIS — I110 Hypertensive heart disease with heart failure: Secondary | ICD-10-CM | POA: Insufficient documentation

## 2015-10-08 DIAGNOSIS — M1990 Unspecified osteoarthritis, unspecified site: Secondary | ICD-10-CM | POA: Insufficient documentation

## 2015-10-08 DIAGNOSIS — I2510 Atherosclerotic heart disease of native coronary artery without angina pectoris: Secondary | ICD-10-CM | POA: Diagnosis not present

## 2015-10-08 DIAGNOSIS — F17200 Nicotine dependence, unspecified, uncomplicated: Secondary | ICD-10-CM | POA: Insufficient documentation

## 2015-10-08 DIAGNOSIS — R69 Illness, unspecified: Secondary | ICD-10-CM | POA: Diagnosis not present

## 2015-10-08 HISTORY — PX: CHEST TUBE INSERTION: SHX231

## 2015-10-08 HISTORY — DX: Opioid abuse, uncomplicated: F11.10

## 2015-10-08 SURGERY — CHEST TUBE INSERTION
Anesthesia: Monitor Anesthesia Care | Wound class: Clean

## 2015-10-08 MED ORDER — FAMOTIDINE 20 MG PO TABS
ORAL_TABLET | ORAL | Status: AC
  Administered 2015-10-08: 20 mg via ORAL
  Filled 2015-10-08: qty 1

## 2015-10-08 MED ORDER — BUPIVACAINE HCL (PF) 0.5 % IJ SOLN
INTRAMUSCULAR | Status: DC | PRN
  Administered 2015-10-08: 6 mL

## 2015-10-08 MED ORDER — FENTANYL CITRATE (PF) 100 MCG/2ML IJ SOLN: 25.0000 ug | INTRAMUSCULAR | Status: DC | PRN

## 2015-10-08 MED ORDER — FAMOTIDINE 20 MG PO TABS
20.0000 mg | ORAL_TABLET | Freq: Once | ORAL | Status: AC
  Administered 2015-10-08: 20 mg via ORAL

## 2015-10-08 MED ORDER — OXYCODONE HCL 5 MG/5ML PO SOLN: 5.0000 mg | Freq: Once | ORAL | Status: DC | PRN

## 2015-10-08 MED ORDER — KETAMINE HCL 50 MG/ML IJ SOLN
INTRAMUSCULAR | Status: DC | PRN
  Administered 2015-10-08 (×2): 50 mg via INTRAMUSCULAR

## 2015-10-08 MED ORDER — BACITRACIN ZINC 500 UNIT/GM EX OINT
TOPICAL_OINTMENT | CUTANEOUS | Status: AC
  Filled 2015-10-08: qty 28.35

## 2015-10-08 MED ORDER — LACTATED RINGERS IV SOLN
INTRAVENOUS | Status: DC
  Administered 2015-10-08 (×2): via INTRAVENOUS

## 2015-10-08 MED ORDER — VANCOMYCIN HCL 500 MG IV SOLR
500.0000 mg | INTRAVENOUS | Status: AC
  Administered 2015-10-08 (×2): 500 mg via INTRAVENOUS
  Filled 2015-10-08: qty 500

## 2015-10-08 MED ORDER — OXYCODONE HCL 5 MG PO TABS: 5.0000 mg | ORAL_TABLET | Freq: Once | ORAL | Status: DC | PRN

## 2015-10-08 MED ORDER — BUPIVACAINE HCL (PF) 0.5 % IJ SOLN
INTRAMUSCULAR | Status: AC
  Filled 2015-10-08: qty 30

## 2015-10-08 MED ORDER — FENTANYL CITRATE (PF) 100 MCG/2ML IJ SOLN
INTRAMUSCULAR | Status: DC | PRN
  Administered 2015-10-08: 100 ug via INTRAVENOUS

## 2015-10-08 MED ORDER — MIDAZOLAM HCL 2 MG/2ML IJ SOLN
INTRAMUSCULAR | Status: DC | PRN
  Administered 2015-10-08: 2 mg via INTRAVENOUS

## 2015-10-08 SURGICAL SUPPLY — 35 items
BLADE SURG SZ11 CARB STEEL (BLADE) IMPLANT
CANISTER SUCT 1200ML W/VALVE (MISCELLANEOUS) IMPLANT
CHLORAPREP W/TINT 26ML (MISCELLANEOUS) ×2 IMPLANT
DRAIN CHEST DRY SUCT SGL (MISCELLANEOUS) IMPLANT
DRAPE LAPAROTOMY 77X122 PED (DRAPES) IMPLANT
ELECT REM PT RETURN 9FT ADLT (ELECTROSURGICAL) ×2
ELECTRODE REM PT RTRN 9FT ADLT (ELECTROSURGICAL) ×1 IMPLANT
GAUZE SPONGE 4X4 12PLY STRL (GAUZE/BANDAGES/DRESSINGS) ×2 IMPLANT
GLOVE SURG SYN 7.5  E (GLOVE) ×1
GLOVE SURG SYN 7.5 E (GLOVE) ×1 IMPLANT
GOWN STRL REUS W/ TWL LRG LVL3 (GOWN DISPOSABLE) IMPLANT
GOWN STRL REUS W/TWL LRG LVL3 (GOWN DISPOSABLE)
KIT PLEURX DRAIN CATH 15.5FR (DRAIN) IMPLANT
KIT RM TURNOVER STRD PROC AR (KITS) ×2 IMPLANT
LABEL OR SOLS (LABEL) IMPLANT
MARKER SKIN DUAL TIP RULER LAB (MISCELLANEOUS) IMPLANT
PACK BASIN MINOR ARMC (MISCELLANEOUS) IMPLANT
SUCTION FRAZIER HANDLE 10FR (MISCELLANEOUS) ×1
SUCTION TUBE FRAZIER 10FR DISP (MISCELLANEOUS) ×1 IMPLANT
SUT ETH BLK MONO 3 0 FS 1 12/B (SUTURE) IMPLANT
SUT ETHILON 3-0 FS-10 30 BLK (SUTURE)
SUT ETHILON 4-0 (SUTURE)
SUT ETHILON 4-0 FS2 18XMFL BLK (SUTURE)
SUT SILK 0 (SUTURE)
SUT SILK 0 30XBRD TIE 6 (SUTURE) IMPLANT
SUT SILK 1 SH (SUTURE) IMPLANT
SUT VIC AB 0 SH 27 (SUTURE) ×2 IMPLANT
SUT VIC AB 2-0 SH 27 (SUTURE) ×1
SUT VIC AB 2-0 SH 27XBRD (SUTURE) ×1 IMPLANT
SUT VIC AB 3-0 SH 27 (SUTURE)
SUT VIC AB 3-0 SH 27X BRD (SUTURE) IMPLANT
SUTURE EHLN 3-0 FS-10 30 BLK (SUTURE) IMPLANT
SUTURE ETHLN 4-0 FS2 18XMF BLK (SUTURE) IMPLANT
SYR 30ML LL (SYRINGE) IMPLANT
SYRINGE 10CC LL (SYRINGE) ×4 IMPLANT

## 2015-10-08 NOTE — Transfer of Care (Signed)
Immediate Anesthesia Transfer of Care Note  Patient: Kathryn Garrett  Procedure(s) Performed: Procedure(s): PLEURX CATH REMOVAL (N/A)  Patient Location: PACU  Anesthesia Type:MAC  Level of Consciousness: awake, alert  and oriented  Airway & Oxygen Therapy: Patient Spontanous Breathing and Patient connected to nasal cannula oxygen  Post-op Assessment: Report given to RN  Post vital signs: Reviewed and stable  Last Vitals:  Filed Vitals:   10/08/15 1036  BP: 125/52  Pulse: 89  Temp: 37.1 C  Resp: 20    Last Pain:  Filed Vitals:   10/08/15 1205  PainSc: 0-No pain      Patients Stated Pain Goal: 2 ([id number])  Complications: No apparent anesthesia complications

## 2015-10-08 NOTE — H&P (View-Only) (Signed)
Kathryn Garrett Inpatient Post-Op Note  Patient ID: Kathryn Garrett, female   DOB: August 26, 1945, 70 y.o.   MRN: [id number]  HISTORY: This patient returns today in follow-up. She underwent a left thoracoscopy with pleural biopsy and Pleurx catheter insertion. Our working diagnosis at the time was a malignant pleural effusion. The final result came back from the pleural biopsy and this was negative for malignancy. All showed some inflammatory changes. She was discharged to home about a week ago with a Pleurx catheter in place. She has not change the dressing as she states she was told not remove it. In addition she's not draining the catheter she has no supplies.   Filed Vitals:   10/05/15 1149  BP: 131/78  Pulse: 99  Temp: 98.6 F (37 C)     EXAM: Resp: Lungs are clear bilaterally.  No respiratory distress, normal effort. Heart:  Regular without murmurs Abd:  Abdomen is soft, non distended and non tender. No masses are palpable.  There is no rebound and no guarding.  Neurological: Alert and oriented to person, place, and time. Coordination normal.  Skin: Skin is warm and dry. No rash noted. No diaphoretic. No erythema. No pallor.  her wounds are all clean dry and intact.  Psychiatric: Normal mood and affect. Normal behavior. Judgment and thought content normal.    ASSESSMENT: We did send the patient for chest x-ray. That did not reveal any pleural effusion. There is no infiltrate. There is some blunting of the left costophrenic angle which is postoperative in nature.   PLAN:   I had a long discussion with her regarding the options at this point. There is no sign of malignancy. There is no obvious infection that still present. She denied any fevers or chills. The fluid is present within the tube does not look infected. After long discussion with her she would like to have the catheter removed as there is no evidence of a recurrent pleural effusion. I will set her up for that.    [name], MD

## 2015-10-08 NOTE — Op Note (Signed)
  10/08/2015  12:05 PM  PATIENT:  Kathryn Garrett  69 y.o. female  PRE-OPERATIVE DIAGNOSIS:  Nonfunctioning PleurX   POST-OPERATIVE DIAGNOSIS:  Same  PROCEDURE:  Removal of PleurX catheter`  SURGEON:  Surgeon(s) and Role:    * [name], MD - Primary  ASSISTANTS: none   ANESTHESIA: MAC  INDICATIONS FOR PROCEDURE Nonfunctioning PleurX  DICTATION: Patient was brought to the OR and placed in the lateral position.  Prepped and draped using sterile technique.  6 cc of 1/2 % Marcaine used to infiltrate the skin exit site.  Suture securing PleurX was cut and steady firm traction on the catheter was applied.  The catheter was removed in toto and documented with photos.  Skin entrance site was covered with sterile gauze and OpSite dressing.  Tolerated well.  Taken to RR in stable condition.    [name], MD

## 2015-10-08 NOTE — Interval H&P Note (Signed)
History and Physical Interval Note:  10/08/2015 11:19 AM  Kathryn Garrett  has presented today for surgery, with the diagnosis of lung mass  The various methods of treatment have been discussed with the patient and family. After consideration of risks, benefits and other options for treatment, the patient has consented to  Procedure(s): PLEURX CATH REMOVAL (N/A) as a surgical intervention .  The patient's history has been reviewed, patient examined, no change in status, stable for surgery.  I have reviewed the patient's chart and labs.  Questions were answered to the patient's satisfaction.     [name]

## 2015-10-08 NOTE — Anesthesia Postprocedure Evaluation (Signed)
Anesthesia Post Note  Patient: Kathryn Garrett  Procedure(s) Performed: Procedure(s) (LRB): PLEURX CATH REMOVAL (N/A)  Patient location during evaluation: PACU Anesthesia Type: General Level of consciousness: awake and alert Pain management: pain level controlled Vital Signs Assessment: post-procedure vital signs reviewed and stable Respiratory status: spontaneous breathing, nonlabored ventilation, respiratory function stable and patient connected to nasal cannula oxygen Cardiovascular status: blood pressure returned to baseline and stable Postop Assessment: no signs of nausea or vomiting Anesthetic complications: no    Last Vitals:  Filed Vitals:   10/08/15 1230 10/08/15 1250  BP: 119/61 128/73  Pulse: 79 87  Temp: 37.6 C   Resp: 15 18    Last Pain:  Filed Vitals:   10/08/15 1251  PainSc: 0-No pain                 Precious Haws Carisha Kantor

## 2015-10-08 NOTE — Discharge Instructions (Signed)

## 2015-10-08 NOTE — Anesthesia Preprocedure Evaluation (Addendum)
Anesthesia Evaluation  Patient identified by MRN, date of birth, ID band Patient awake    Reviewed: Allergy & Precautions, H&P , NPO status , Patient's Chart, lab work & pertinent test results, reviewed documented beta blocker date and time   History of Anesthesia Complications Negative for: history of anesthetic complications  Airway Mallampati: II  TM Distance: <3 FB Neck ROM: limited    Dental  (+) Poor Dentition, Chipped, Missing, Upper Dentures   Pulmonary pneumonia, resolved, Current Smoker,    Pulmonary exam normal        Cardiovascular hypertension, + CAD, + Past MI and +CHF  Normal cardiovascular exam Rhythm:regular Rate:Normal     Neuro/Psych negative neurological ROS  negative psych ROS   GI/Hepatic negative GI ROS, Neg liver ROS,   Endo/Other  negative endocrine ROS  Renal/GU negative Renal ROS  negative genitourinary   Musculoskeletal  (+) Arthritis ,   Abdominal   Peds  Hematology negative hematology ROS (+)   Anesthesia Other Findings Past Medical History:   MI (myocardial infarction) ([facility])                             Arthritis                                                    CHF (congestive heart failure) ([facility])                         Hypertension                                                 CAD (coronary artery disease)                              Past Surgical History:   ABDOMINAL HYSTERECTOMY                                      BMI    Body Mass Index   27.57 kg/m 2     Reproductive/Obstetrics negative OB ROS                            Anesthesia Physical  Anesthesia Plan  ASA: III  Anesthesia Plan: General and MAC   Post-op Pain Management:    Induction: Intravenous  Airway Management Planned: Double Lumen EBT  Additional Equipment:   Intra-op Plan:   Post-operative Plan:   Informed Consent: I have reviewed the patients History and  Physical, chart, labs and discussed the procedure including the risks, benefits and alternatives for the proposed anesthesia with the patient or authorized representative who has indicated his/her understanding and acceptance.   Dental Advisory Given  Plan Discussed with: CRNA  Anesthesia Plan Comments:        Anesthesia Quick Evaluation

## 2015-10-10 ENCOUNTER — Encounter: Payer: Self-pay | Admitting: Cardiothoracic Surgery

## 2015-10-13 NOTE — Discharge Summary (Signed)
[facility] [place] NAME: Kathryn Garrett    MR#:  [id number]  DATE OF BIRTH:  1945-09-13  DATE OF ADMISSION:  09/24/2015 ADMITTING PHYSICIAN: Lance Coon, MD  DATE OF DISCHARGE: 10/01/2015  1:58 PM  PRIMARY CARE PHYSICIAN: No primary care provider on file.    ADMISSION DIAGNOSIS:  Pleural effusion [J90] Community acquired pneumonia [J18.9]  DISCHARGE DIAGNOSIS:  Principal Problem:   Sepsis ([place]) Active Problems:   Pleural effusion on left   CAP (community acquired pneumonia)   CAD (coronary artery disease)   HTN (hypertension)   Pleural effusion   SECONDARY DIAGNOSIS:   Past Medical History  Diagnosis Date  . Arthritis   . CHF (congestive heart failure) ([place])   . Hypertension   . CAD (coronary artery disease)   . MI (myocardial infarction) ([place]) 2009  . Narcotic abuse     pt now taking Suboxone tid    HOSPITAL COURSE:   #1. Sepsis, was felt due to Community-acquired pneumonia and parapneumonic effusion/empyema, continue levofloxacin for 7 day course per Dr. Blane Ohara recommendations , cultures were negative, status post left thoracentesis 09/25/2015. Pleural fluid as well as Blood cultures are negative so far, sputum culture is pending. Pleural fluid cytology is negative for malignancy. White blood cell count improved to 11.8 today after levofloxacin was started. Repeated chest x-ray showed some fluid reaccumulation. Patient was seen by thoracic surgeon, Dr. Genevive Bi, patient underwent thoracoscopy with pleural biopsy, Pleurx catheter placement, perioperative bronchoscopy. Continue supportive care. Appreciate Dr. Genevive Bi and Dr. Blane Ohara input D/c with catheter clamped. #2. Left-sided Pleural effusion, which was suspected due to pneumonia/parapneumonic effusion, status post left thoracentesis 09/25/2015, cultures are negative, . Pleural fluid cytology is negative for malignancy. Patient underwent pleural biopsy ,  results pending- advised to follow in office. ID added fungal and AFB cultures to the samples. #3. Metabolic acidosis, as based on basic metabolic panel on admission, no lactic acidosis, improved , following closely #4. Leukocytosis, improved today on levofloxacin, Dr. Ola Spurr recommends to continue antibiotic for 7 days #5. Essential hypertension, now patient is relatively hypotensive, now on decreased metoprolol dose #6. Sinus tachycardia, resolved with IV fluid administration, supportive therapy, continue lower dose of metoprolol #7. Community acquired bacterial pneumonia . Procalcitonin level is low at less than 0.1, however, infectious disease specialist, Dr. Ola Spurr recommended antibiotic therapy for 7 day, continue levofloxacin for 7 day therapy, awaiting for pleural biopsy results. Pleural fluid cytology is negative for malignancy.   DISCHARGE CONDITIONS:   stable.  CONSULTS OBTAINED:  Treatment Team:  [name], MD Leonel Ramsay, MD  DRUG ALLERGIES:   Allergies  Allergen Reactions  . Azithromycin Rash    DISCHARGE MEDICATIONS:   Discharge Medication List as of 10/01/2015  1:39 PM    START taking these medications   Details  levofloxacin (LEVAQUIN) 500 MG tablet Take 1 tablet (500 mg total) by mouth daily., Starting 10/02/2015, Until Discontinued, Print    oxyCODONE (OXY IR/ROXICODONE) 5 MG immediate release tablet Take 1 tablet (5 mg total) by mouth every 4 (four) hours as needed for moderate pain., Starting 10/01/2015, Until Discontinued, Print      CONTINUE these medications which have NOT CHANGED   Details  clopidogrel (PLAVIX) 75 MG tablet Take 1 tablet by mouth daily., Starting 08/23/2015, Until Discontinued, Historical Med    metoprolol tartrate (LOPRESSOR) 25 MG tablet Take 1 tablet by mouth 2 (two) times daily., Starting 08/23/2015, Until Discontinued, Historical Med  STOP taking these medications     amoxicillin (AMOXIL) 875 MG tablet       SUBOXONE 8-2 MG FILM          DISCHARGE INSTRUCTIONS:    Follow with Dr. Genevive Bi.  If you experience worsening of your admission symptoms, develop shortness of breath, life threatening emergency, suicidal or homicidal thoughts you must seek medical attention immediately by calling 911 or calling your MD immediately  if symptoms less severe.  You Must read complete instructions/literature along with all the possible adverse reactions/side effects for all the Medicines you take and that have been prescribed to you. Take any new Medicines after you have completely understood and accept all the possible adverse reactions/side effects.   Please note  You were cared for by a hospitalist during your hospital stay. If you have any questions about your discharge medications or the care you received while you were in the hospital after you are discharged, you can call the unit and asked to speak with the hospitalist on call if the hospitalist that took care of you is not available. Once you are discharged, your primary care physician will handle any further medical issues. Please note that NO REFILLS for any discharge medications will be authorized once you are discharged, as it is imperative that you return to your primary care physician (or establish a relationship with a primary care physician if you do not have one) for your aftercare needs so that they can reassess your need for medications and monitor your lab values.    Today   CHIEF COMPLAINT:   Chief Complaint  Patient presents with  . Pleurisy    HISTORY OF PRESENT ILLNESS:  Kathryn Garrett  is a 70 y.o. female presents with Shortness of breath and left-sided pleurisy. Patient states that she was seen in outpatient clinic a couple weeks ago and diagnosed with bronchitis. She was given azithromycin at that time. She subsequently developed a rash and her antibiotic was changed to amoxicillin. However, over the last several days she's  been feeling significantly worse. She denies any measured fevers, but states that she feels "cold" all the time. She's had a mild cough, nonproductive. She went back to the clinic for reevaluation today and was found on chest x-ray to have left-sided pleural effusion, and was sent to the ED for further evaluation. In the ED she was found to be tachycardic and to have a leukocytosis on laboratory evaluation, and CT scan confirming pleural effusion and favoring infectious etiology. Hospitals were called for admission.  VITAL SIGNS:  Blood pressure 96/72, pulse 101, temperature 99.4 F (37.4 C), temperature source Oral, resp. rate 18, height 4\' 11"  (1.499 m), weight 61.961 kg (136 lb 9.6 oz), SpO2 94 %.  I/O:  No intake or output data in the 24 hours ending 10/13/15 1527  PHYSICAL EXAMINATION:   GENERAL: 70 y.o.-year-old patient lying in the bed, not in distress , comfortable today, but sleepy  EYES: Pupils equal, round, reactive to light and accommodation. No scleral icterus. Extraocular muscles intact.  HEENT: Head atraumatic, normocephalic. Oropharynx and nasopharynx clear.  NECK: Supple, no jugular venous distention. No thyroid enlargement, no tenderness.  LUNGS: Better air entranceon the left , normal air entrance on the right, no wheezing, rales,rhonchi or crepitation. No use of accessory muscles of respiration. Pleurx catheter is draining minimal amount of bloody fluid  CARDIOVASCULAR: S1, S2 normal. No murmurs, rubs, or gallops.  ABDOMEN: Soft, nontender, nondistended. Bowel sounds present. No organomegaly or  mass.  EXTREMITIES: No pedal edema, cyanosis, or clubbing.  NEUROLOGIC: Cranial nerves II through XII are intact. Muscle strength 5/5 in all extremities. Sensation intact. Gait not checked.  PSYCHIATRIC: The patient is somnolent, difficult to assess orientation.  SKIN: No obvious rash, lesion, or ulcer.   DATA REVIEW:   CBC No results for input(s): WBC, HGB, HCT, PLT  in the last 168 hours.  Chemistries  No results for input(s): NA, K, CL, CO2, GLUCOSE, BUN, CREATININE, CALCIUM, MG, AST, ALT, ALKPHOS, BILITOT in the last 168 hours.  Invalid input(s): GFRCGP  Cardiac Enzymes No results for input(s): TROPONINI in the last 168 hours.  Microbiology Results  Results for orders placed or performed during the hospital encounter of 09/24/15  Blood culture (routine x 2)     Status: None   Collection Time: 09/24/15 10:09 PM  Result Value Ref Range Status   Specimen Description BLOOD RIGHT ASSIST CONTROL  Final   Special Requests   Final    BOTTLES DRAWN AEROBIC AND ANAEROBIC 14CCAERO,15CCANA   Culture NO GROWTH 6 DAYS  Final   Report Status 09/30/2015 FINAL  Final  Blood culture (routine x 2)     Status: None   Collection Time: 09/24/15 10:09 PM  Result Value Ref Range Status   Specimen Description BLOOD LEFT HAND  Final   Special Requests BOTTLES DRAWN AEROBIC AND ANAEROBIC [place]  Final   Culture NO GROWTH 6 DAYS  Final   Report Status 09/30/2015 FINAL  Final  Culture, body fluid-bottle     Status: None   Collection Time: 09/25/15  2:00 PM  Result Value Ref Range Status   Specimen Description PLEURAL  Final   Special Requests NONE  Final   Gram Stain   Final    WBC SEEN WBC PRESENT,BOTH PMN AND MONONUCLEAR NO ORGANISMS SEEN CYTOSPIN SMEAR    Culture NO GROWTH 5 DAYS  Final   Report Status 09/30/2015 FINAL  Final  CULTURE, BLOOD (ROUTINE X 2) w Reflex to PCR ID Panel     Status: None   Collection Time: 09/28/15  9:26 AM  Result Value Ref Range Status   Specimen Description BLOOD RIGHT AC  Final   Special Requests   Final    BOTTLES DRAWN AEROBIC AND ANAEROBIC AER 6ML ANA 2ML   Culture NO GROWTH 5 DAYS  Final   Report Status 10/03/2015 FINAL  Final  CULTURE, BLOOD (ROUTINE X 2) w Reflex to PCR ID Panel     Status: None   Collection Time: 09/28/15  9:26 AM  Result Value Ref Range Status   Specimen Description BLOOD LEFT AC  Final    Special Requests   Final    BOTTLES DRAWN AEROBIC AND ANAEROBIC AER 9ML ANA 4ML   Culture NO GROWTH 5 DAYS  Final   Report Status 10/03/2015 FINAL  Final  Anaerobic culture     Status: None   Collection Time: 09/29/15 11:41 AM  Result Value Ref Range Status   Specimen Description BIOPSY  Final   Special Requests NONE  Final   Culture   Final    NO ANAEROBES ISOLATED; CULTURE IN PROGRESS FOR 5 DAYS   Report Status 10/03/2015 FINAL  Final  Tissue culture     Status: None   Collection Time: 09/29/15 11:41 AM  Result Value Ref Range Status   Specimen Description BIOPSY  Final   Special Requests NONE  Final   Gram Stain   Final    FEW WBC  SEEN FEW RED BLOOD CELLS NO ORGANISMS SEEN    Culture NO GROWTH 3 DAYS  Final   Report Status 10/02/2015 FINAL  Final  Culture, fungus without smear ([facility]-Only)     Status: None (Preliminary result)   Collection Time: 09/29/15 11:41 AM  Result Value Ref Range Status   Specimen Description BIOPSY  Final   Special Requests NONE  Final   Culture NO FUNGUS ISOLATED AFTER 14 DAYS  Final   Report Status PENDING  Incomplete  Acid Fast Smear (AFB)     Status: None   Collection Time: 09/29/15 11:41 AM  Result Value Ref Range Status   AFB Specimen Processing Concentration  Final   Acid Fast Smear Negative  Final    Comment: (NOTE) Performed At: [facility] [place], [place] [id number] Lindon Romp MD [id number]    Source (AFB) TISSUE  Final  Acid Fast Culture with reflexed sensitivities     Status: None   Collection Time: 09/29/15 11:41 AM  Result Value Ref Range Status   Acid Fast Culture Comment  Final    Comment: (NOTE) Specimen has been received and testing has been initiated. Performed At: [facility] [place], [place] [id number] Lindon Romp MD [id number]    Source of Sample TISSUE  Final  Culture, body fluid-bottle     Status: None   Collection Time: 09/29/15 11:51 AM  Result  Value Ref Range Status   Specimen Description PLEURAL  Final   Special Requests NONE  Final   Gram Stain   Final    WBC PRESENT,BOTH PMN AND MONONUCLEAR NO ORGANISMS SEEN CYTOSPIN SMEAR    Culture NO GROWTH 4 DAYS  Final   Report Status 10/03/2015 FINAL  Final  Acid Fast Smear (AFB)     Status: None   Collection Time: 09/29/15 11:51 AM  Result Value Ref Range Status   AFB Specimen Processing Concentration  Final   Acid Fast Smear Negative  Final    Comment: (NOTE) Performed At: [facility] [place], [place] [id number] Lindon Romp MD [id number]    Source (AFB) FLUID  Final  Acid Fast Culture with reflexed sensitivities     Status: None   Collection Time: 09/29/15 11:51 AM  Result Value Ref Range Status   Acid Fast Culture Comment  Final    Comment: (NOTE) Specimen has been received and testing has been initiated. Performed At: [facility] [place], [place] [id number] Lindon Romp MD [id number]    Source of Sample FLUID  Final    RADIOLOGY:  No results found.   Management plans discussed with the patient, family and they are in agreement.  CODE STATUS:  Code Status History    Date Active Date Inactive Code Status Order ID Comments User Context   09/24/2015 10:45 PM 10/01/2015  5:19 PM Full Code [id number]  Lance Coon, MD ED      TOTAL TIME TAKING CARE OF THIS PATIENT: 35 minutes.    Vaughan Basta M.D on 10/13/2015 at 3:27 PM  Between 7am to 6pm - Pager - [phone]  After 6pm go to [email or web address] - password [email or web address] [facility]  Office  [phone]  CC: Primary care physician; No primary care provider on file.   Note: This dictation was prepared with Dragon dictation along with smaller phrase technology. Any transcriptional errors that result from this process are unintentional.

## 2015-10-15 ENCOUNTER — Ambulatory Visit (INDEPENDENT_AMBULATORY_CARE_PROVIDER_SITE_OTHER): Payer: Medicare HMO | Admitting: Cardiothoracic Surgery

## 2015-10-15 ENCOUNTER — Encounter: Payer: Self-pay | Admitting: Cardiothoracic Surgery

## 2015-10-15 VITALS — BP 128/78 | HR 86 | Temp 98.1°F | Resp 20 | Ht 59.0 in | Wt 132.6 lb

## 2015-10-15 DIAGNOSIS — J90 Pleural effusion, not elsewhere classified: Secondary | ICD-10-CM

## 2015-10-15 DIAGNOSIS — Z720 Tobacco use: Secondary | ICD-10-CM | POA: Insufficient documentation

## 2015-10-15 DIAGNOSIS — Z955 Presence of coronary angioplasty implant and graft: Secondary | ICD-10-CM | POA: Insufficient documentation

## 2015-10-15 NOTE — Progress Notes (Signed)
Kathryn Garrett Inpatient Post-Op Note  Patient ID: Kathryn Garrett, female   DOB: 27-Apr-1946, 70 y.o.   MRN: [id number]  HISTORY: She returns today in follow-up. She denies any fevers or chills. She states she does have some early fatigue. She has a slight cough but this has improved. She does not have any shortness of breath   Filed Vitals:   10/15/15 1106  BP: 128/78  Pulse: 86  Temp: 98.1 F (36.7 C)  Resp: 20     EXAM: Resp: Lungs are clear bilaterally.  No respiratory distress, normal effort. Heart:  Regular without murmurs Abd:  Abdomen is soft, non distended and non tender. No masses are palpable.  There is no rebound and no guarding.  Neurological: Alert and oriented to person, place, and time. Coordination normal.  Skin: Skin is warm and dry. No rash noted. No diaphoretic. No erythema. No pallor.  Psychiatric: Normal mood and affect. Normal behavior. Judgment and thought content normal.    ASSESSMENT: Her wounds are all clean and healed. There is no erythema.   PLAN:   I would like her to come back in 3 months time with a repeat chest CT. We'll go ahead and schedule her for. She'll follow-up with her family physician next week.    [name], MD

## 2015-10-15 NOTE — Patient Instructions (Signed)
I will call you with your appointment for Dr. Allean Found (Your PCP). To see what she can do to help you stop smoking.  You look great today. You do not need to set-up a follow-up appointment. However, Call me if you have any questions or concerns.  Smoking Cessation, Tips for Success If you are ready to quit smoking, congratulations! You have chosen to help yourself be healthier. Cigarettes bring nicotine, tar, carbon monoxide, and other irritants into your body. Your lungs, heart, and blood vessels will be able to work better without these poisons. There are many different ways to quit smoking. Nicotine gum, nicotine patches, a nicotine inhaler, or nicotine nasal spray can help with physical craving. Hypnosis, support groups, and medicines help break the habit of smoking. WHAT THINGS CAN I DO TO MAKE QUITTING EASIER?  Here are some tips to help you quit for good:  Pick a date when you will quit smoking completely. Tell all of your friends and family about your plan to quit on that date.  Do not try to slowly cut down on the number of cigarettes you are smoking. Pick a quit date and quit smoking completely starting on that day.  Throw away all cigarettes.   Clean and remove all ashtrays from your home, work, and car.  On a card, write down your reasons for quitting. Carry the card with you and read it when you get the urge to smoke.  Cleanse your body of nicotine. Drink enough water and fluids to keep your urine clear or pale yellow. Do this after quitting to flush the nicotine from your body.  Learn to predict your moods. Do not let a bad situation be your excuse to have a cigarette. Some situations in your life might tempt you into wanting a cigarette.  Never have "just one" cigarette. It leads to wanting another and another. Remind yourself of your decision to quit.  Change habits associated with smoking. If you smoked while driving or when feeling stressed, try other activities to replace  smoking. Stand up when drinking your coffee. Brush your teeth after eating. Sit in a different chair when you read the paper. Avoid alcohol while trying to quit, and try to drink fewer caffeinated beverages. Alcohol and caffeine may urge you to smoke.  Avoid foods and drinks that can trigger a desire to smoke, such as sugary or spicy foods and alcohol.  Ask people who smoke not to smoke around you.  Have something planned to do right after eating or having a cup of coffee. For example, plan to take a walk or exercise.  Try a relaxation exercise to calm you down and decrease your stress. Remember, you may be tense and nervous for the first 2 weeks after you quit, but this will pass.  Find new activities to keep your hands busy. Play with a pen, coin, or rubber band. Doodle or draw things on paper.  Brush your teeth right after eating. This will help cut down on the craving for the taste of tobacco after meals. You can also try mouthwash.   Use oral substitutes in place of cigarettes. Try using lemon drops, carrots, cinnamon sticks, or chewing gum. Keep them handy so they are available when you have the urge to smoke.  When you have the urge to smoke, try deep breathing.  Designate your home as a nonsmoking area.  If you are a heavy smoker, ask your health care provider about a prescription for nicotine chewing gum. It  can ease your withdrawal from nicotine.  Reward yourself. Set aside the cigarette money you save and buy yourself something nice.  Look for support from others. Join a support group or smoking cessation program. Ask someone at home or at work to help you with your plan to quit smoking.  Always ask yourself, "Do I need this cigarette or is this just a reflex?" Tell yourself, "Today, I choose not to smoke," or "I do not want to smoke." You are reminding yourself of your decision to quit.  Do not replace cigarette smoking with electronic cigarettes (commonly called  e-cigarettes). The safety of e-cigarettes is unknown, and some may contain harmful chemicals.  If you relapse, do not give up! Plan ahead and think about what you will do the next time you get the urge to smoke. HOW WILL I FEEL WHEN I QUIT SMOKING? You may have symptoms of withdrawal because your body is used to nicotine (the addictive substance in cigarettes). You may crave cigarettes, be irritable, feel very hungry, cough often, get headaches, or have difficulty concentrating. The withdrawal symptoms are only temporary. They are strongest when you first quit but will go away within 10-14 days. When withdrawal symptoms occur, stay in control. Think about your reasons for quitting. Remind yourself that these are signs that your body is healing and getting used to being without cigarettes. Remember that withdrawal symptoms are easier to treat than the major diseases that smoking can cause.  Even after the withdrawal is over, expect periodic urges to smoke. However, these cravings are generally short lived and will go away whether you smoke or not. Do not smoke! WHAT RESOURCES ARE AVAILABLE TO HELP ME QUIT SMOKING? Your health care provider can direct you to community resources or hospitals for support, which may include:  Group support.  Education.  Hypnosis.  Therapy.   This information is not intended to replace advice given to you by your health care provider. Make sure you discuss any questions you have with your health care provider.   Document Released: 01/28/2004 Document Revised: 05/22/2014 Document Reviewed: 10/17/2012 Elsevier Interactive Patient Education [employer].

## 2015-10-20 ENCOUNTER — Telehealth: Payer: Self-pay

## 2015-10-20 NOTE — Telephone Encounter (Signed)
Appointment made with Dr. Allean Found for Smoking Cessation and Physical on 6/15 at 1230pm. Patient was given this information and read back information over the phone.

## 2015-10-26 DIAGNOSIS — R05 Cough: Secondary | ICD-10-CM | POA: Diagnosis not present

## 2015-10-26 DIAGNOSIS — J188 Other pneumonia, unspecified organism: Secondary | ICD-10-CM | POA: Diagnosis not present

## 2015-10-27 ENCOUNTER — Ambulatory Visit
Admission: RE | Admit: 2015-10-27 | Discharge: 2015-10-27 | Disposition: A | Discharge status: Home / Self Care | Payer: Medicare HMO | Source: Ambulatory Visit | Attending: Nurse Practitioner | Admitting: Nurse Practitioner

## 2015-10-27 ENCOUNTER — Other Ambulatory Visit: Payer: Self-pay | Admitting: Nurse Practitioner

## 2015-10-27 DIAGNOSIS — R05 Cough: Secondary | ICD-10-CM | POA: Insufficient documentation

## 2015-10-27 DIAGNOSIS — J188 Other pneumonia, unspecified organism: Secondary | ICD-10-CM | POA: Insufficient documentation

## 2015-10-27 DIAGNOSIS — R0602 Shortness of breath: Secondary | ICD-10-CM | POA: Diagnosis not present

## 2015-10-27 DIAGNOSIS — R059 Cough, unspecified: Secondary | ICD-10-CM

## 2015-10-27 IMAGING — CR DG CHEST 2V
1 series · 2 of 2 positions shown · non-contrast
Comparison: [DATE]

CLINICAL DATA: Cough for 1 month.  Shortness of breath

EXAM:
CHEST  2 VIEW

[Series 1: dg chest 2 view · 0.14mm/px · 2 of 2 slices shown]
[im 1/2]
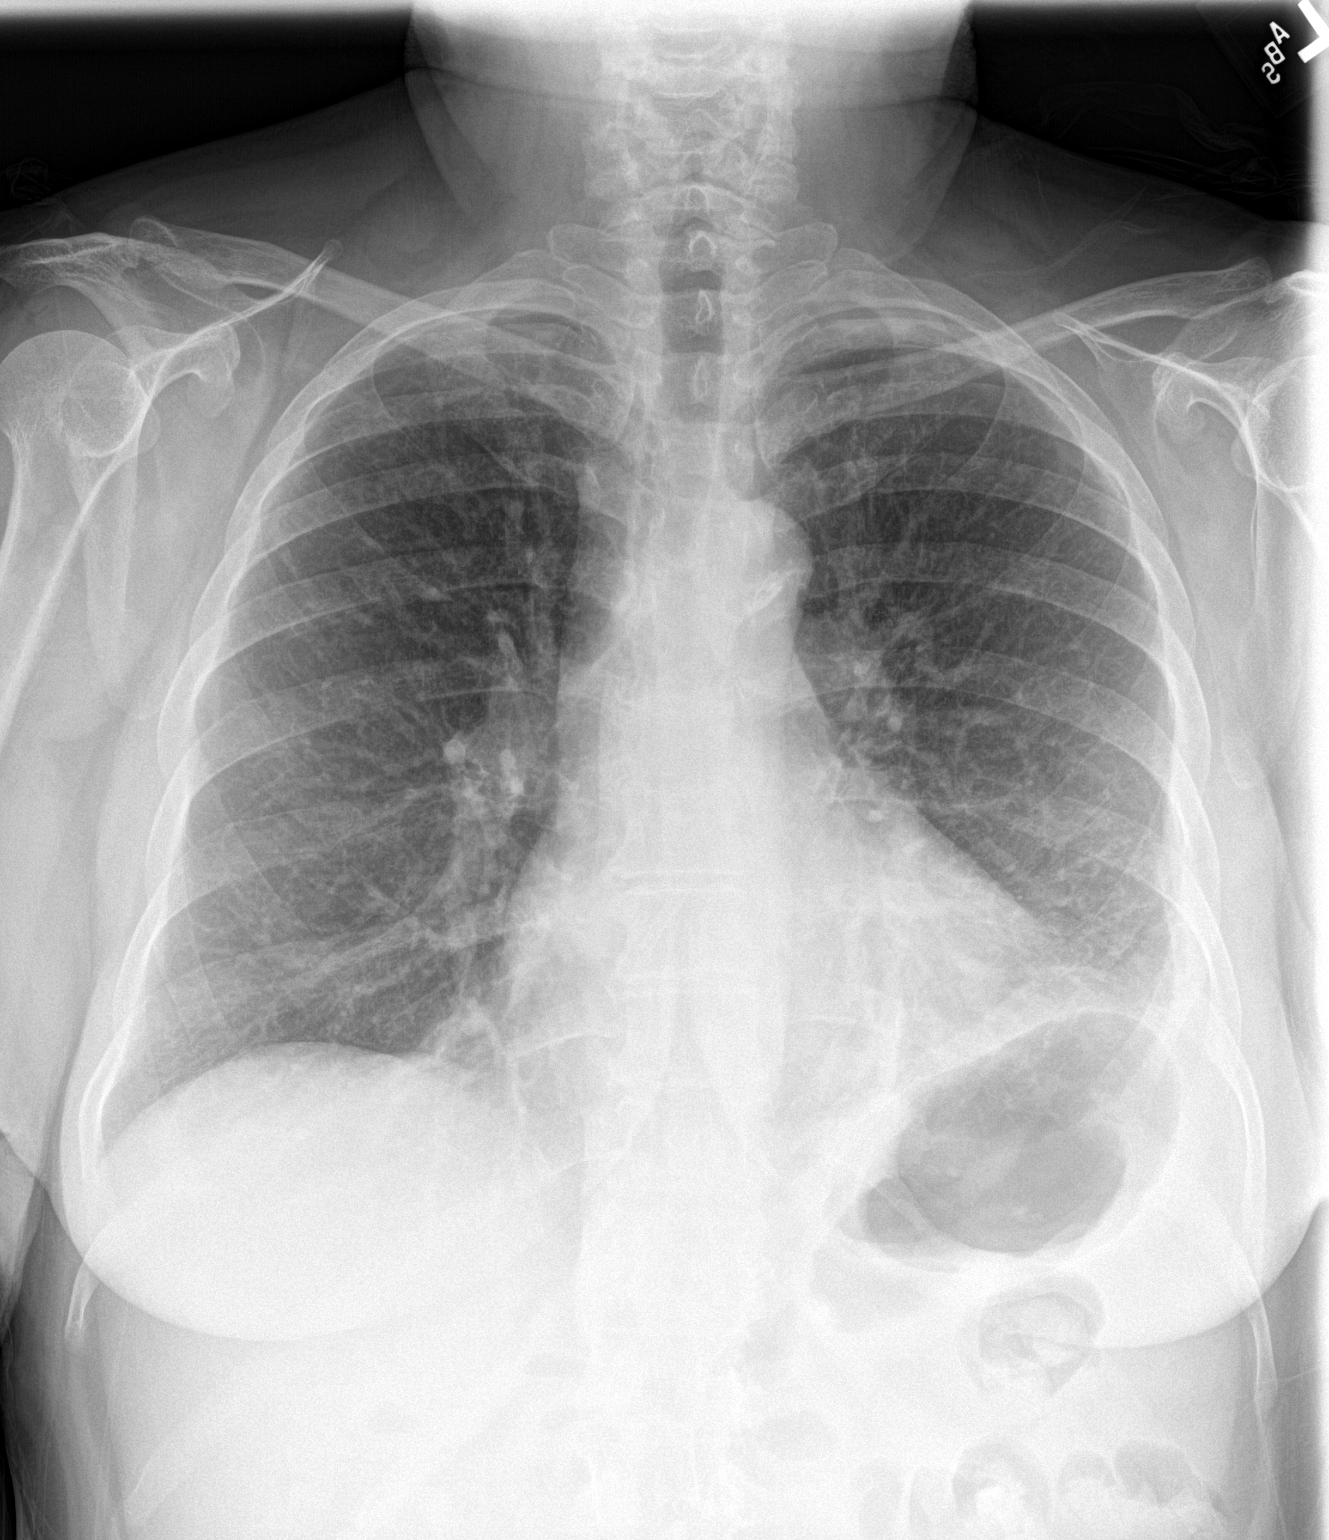
[im 2/2]
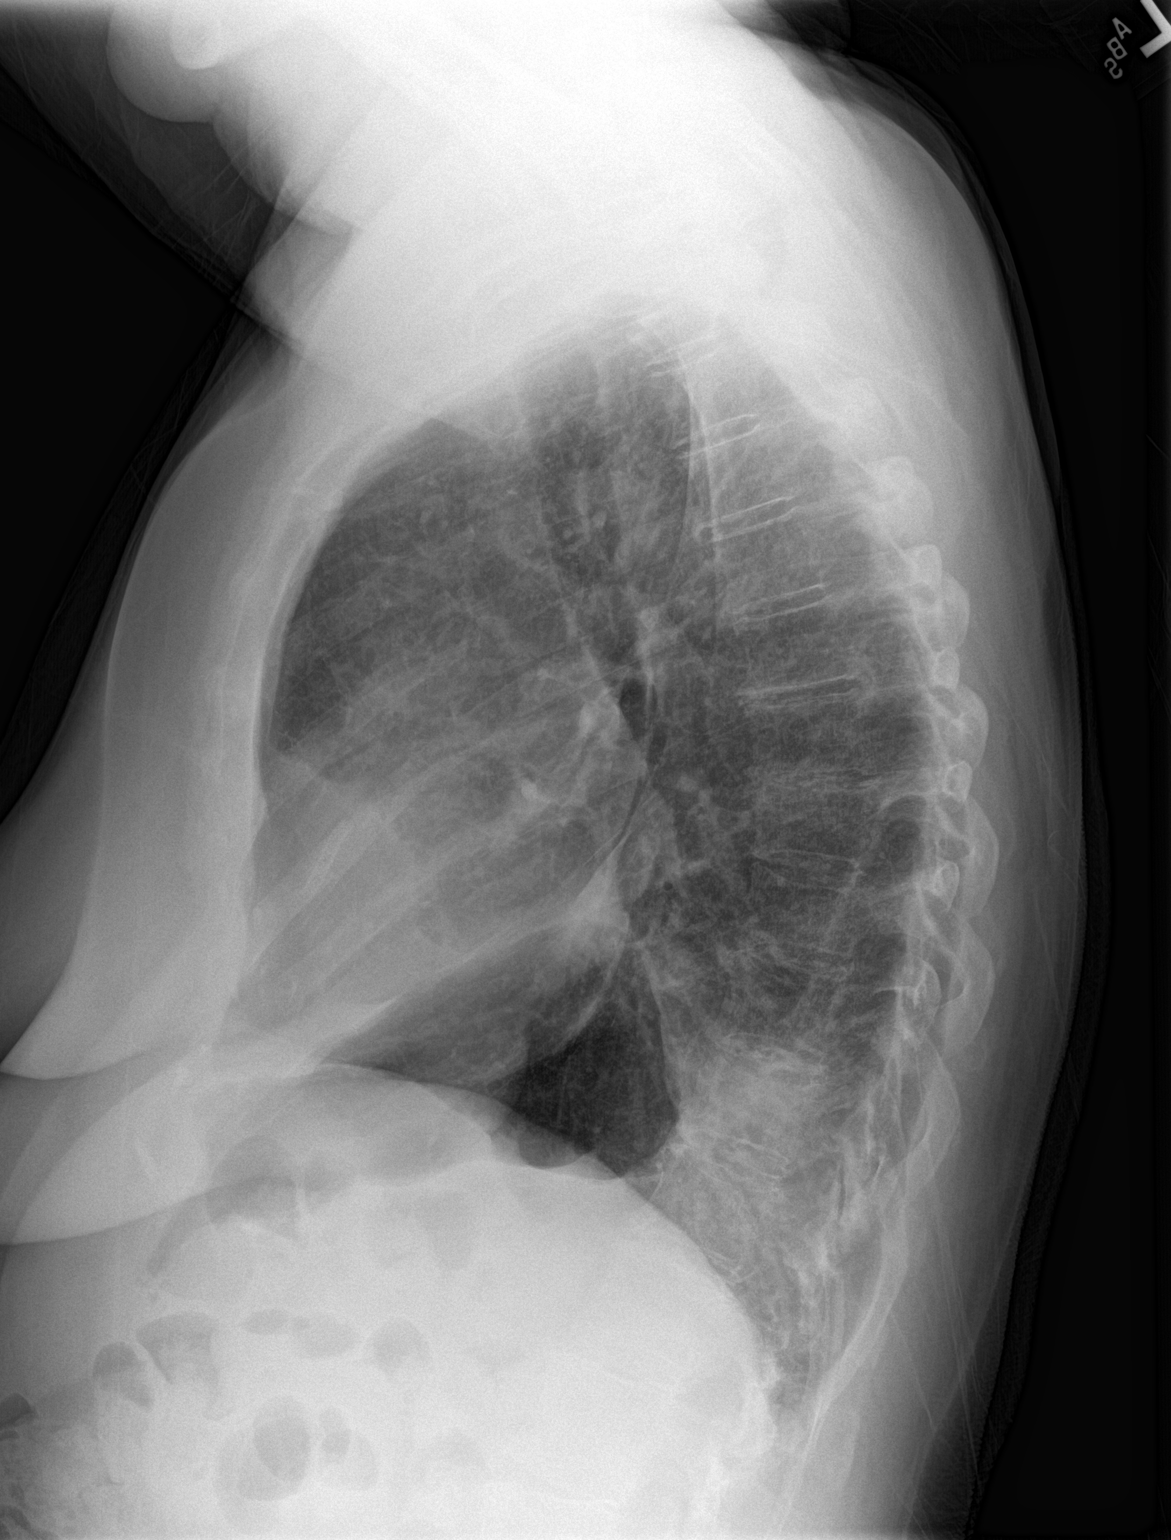

[2 of 2 positions shown; findings below may reference images not displayed]

FINDINGS: There is scarring in the lateral left base with slight elevation of
the left hemidiaphragm. There is no appreciable edema or
consolidation. Heart size and pulmonary vascularity are normal. No
adenopathy. There is atherosclerotic calcification in the aorta.
There is degenerative change in the thoracic spine.
IMPRESSION: Scarring lateral left base with elevation of left hemidiaphragm. No
edema or consolidation. Stable cardiac silhouette.

## 2015-10-29 LAB — CULTURE, FUNGUS WITHOUT SMEAR

## 2015-11-04 ENCOUNTER — Encounter: Payer: Self-pay | Admitting: Interventional Cardiology

## 2015-11-11 ENCOUNTER — Encounter: Payer: Self-pay | Admitting: Interventional Cardiology

## 2015-12-06 LAB — CYTOLOGY - NON PAP

## 2015-12-08 ENCOUNTER — Ambulatory Visit: Payer: Medicare HMO | Admitting: Interventional Cardiology

## 2015-12-09 ENCOUNTER — Ambulatory Visit: Payer: Medicare HMO | Admitting: Interventional Cardiology

## 2015-12-15 ENCOUNTER — Encounter: Payer: Self-pay | Admitting: Interventional Cardiology

## 2016-01-05 ENCOUNTER — Telehealth: Payer: Self-pay

## 2016-01-05 NOTE — Telephone Encounter (Signed)
Patient is in need of 3 month follow-up with Dr. Genevive Bi the first week in September with CT Scan of Chest prior.  CT Scan ordered at this time. Call made to Central Scheduling. No Answer. Will call tomorrow.

## 2016-01-10 NOTE — Telephone Encounter (Signed)
CT Scan scheduled for 01/14/16 at [facility] at Wednesday at 1330.  Follow-up appointment scheduled with Dr. Genevive Bi on 01/14/16 at 0830am.  Called patient at this time to give above information. No answer. Left voicemail for return phone call.

## 2016-01-12 ENCOUNTER — Ambulatory Visit: Payer: Medicare HMO

## 2016-01-12 NOTE — Telephone Encounter (Signed)
Called patient once again at this time. No answer. Left voicemail for return phone call.   Call made to Kathryn Garrett [facility]). No answer. Left voicemail for return phone call.  Call made to patient's employer- [phone] and patient picked up. Number added to her demographics.   CT scheduled for Tomorrow, 8/31 at [facility] at 1445.   Follow-up scheduled for 01/14/16 with Dr. Genevive Bi.

## 2016-01-13 ENCOUNTER — Ambulatory Visit
Admission: RE | Admit: 2016-01-13 | Discharge: 2016-01-13 | Disposition: A | Discharge status: Home / Self Care | Payer: Medicare HMO | Source: Ambulatory Visit | Attending: Cardiothoracic Surgery | Admitting: Cardiothoracic Surgery

## 2016-01-13 DIAGNOSIS — R911 Solitary pulmonary nodule: Secondary | ICD-10-CM | POA: Insufficient documentation

## 2016-01-13 DIAGNOSIS — J90 Pleural effusion, not elsewhere classified: Secondary | ICD-10-CM | POA: Diagnosis not present

## 2016-01-13 DIAGNOSIS — I700 Atherosclerosis of aorta: Secondary | ICD-10-CM | POA: Diagnosis not present

## 2016-01-13 DIAGNOSIS — I2510 Atherosclerotic heart disease of native coronary artery without angina pectoris: Secondary | ICD-10-CM | POA: Insufficient documentation

## 2016-01-13 DIAGNOSIS — J984 Other disorders of lung: Secondary | ICD-10-CM | POA: Diagnosis not present

## 2016-01-13 DIAGNOSIS — J439 Emphysema, unspecified: Secondary | ICD-10-CM | POA: Insufficient documentation

## 2016-01-13 DIAGNOSIS — D3502 Benign neoplasm of left adrenal gland: Secondary | ICD-10-CM | POA: Diagnosis not present

## 2016-01-13 LAB — POCT I-STAT CREATININE: Creatinine, Ser: 0.7 mg/dL (ref 0.44–1.00)

## 2016-01-13 IMAGING — CT CT CHEST W/ CM
2 of 3 series · 14 of 36 positions shown, 17 images · IV contrast (iopamidol)
Comparison: [DATE] chest radiograph. [DATE] chest CT
angiogram.

CLINICAL DATA: Follow-up pleural effusion and pulmonary nodule.

EXAM:
CT CHEST WITH CONTRAST
TECHNIQUE: Multidetector CT imaging of the chest was performed during
intravenous contrast administration.
CONTRAST:  75mL [V8] IOPAMIDOL ([V8]) INJECTION 61%

[Series 2: axial st · axial · 0.65mm/px · z∈[+472,+696]mm · 11 of 132 slices shown, 14 images]
[im 10/132  mediastinal]
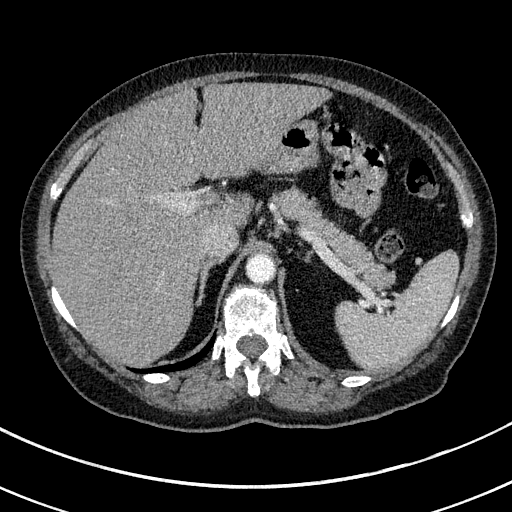
[im 10/132  lung]
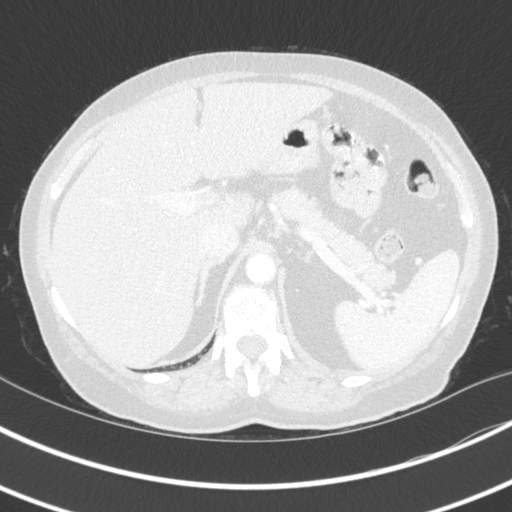
[im 20/132  lung]
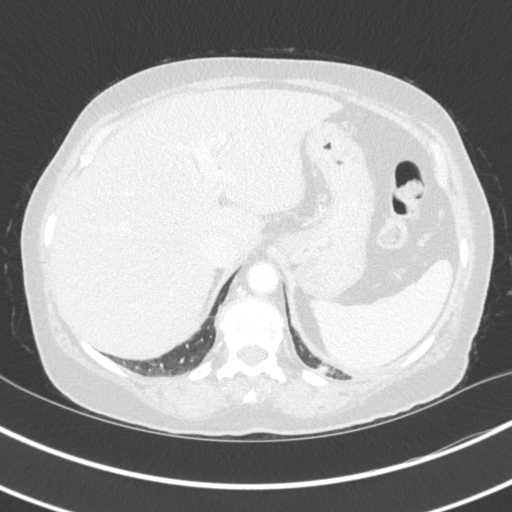
[im 30/132  lung]
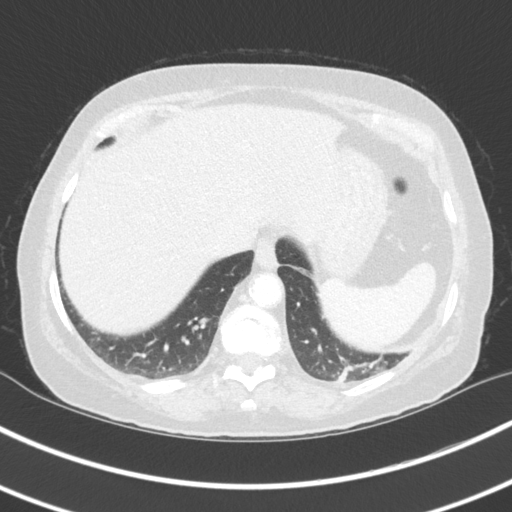
[im 44/132  lung]
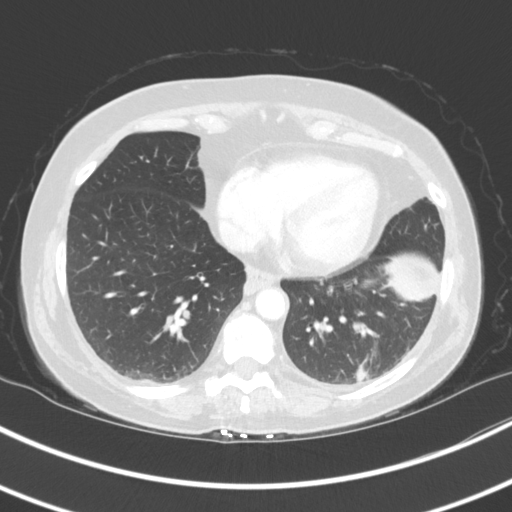
[im 54/132  mediastinal]
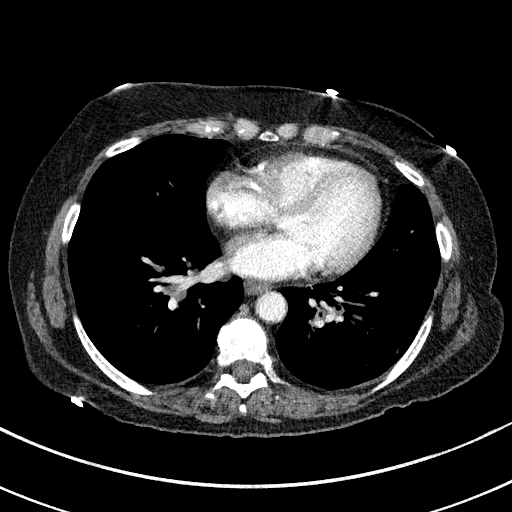
[im 54/132  lung]
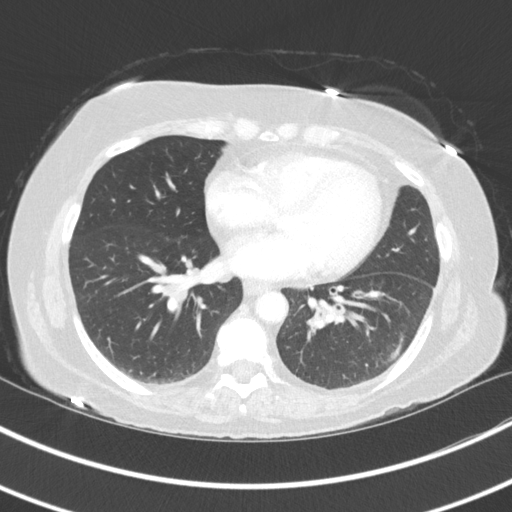
[im 68/132  lung]
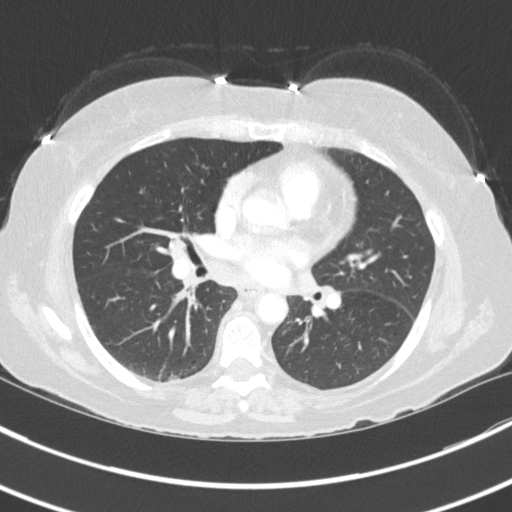
[im 78/132  lung]
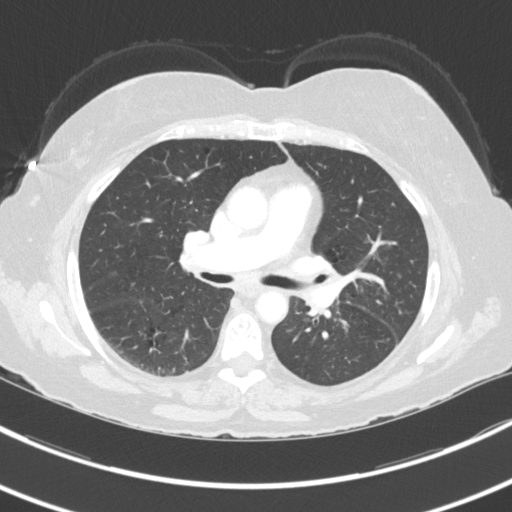
[im 88/132  lung]
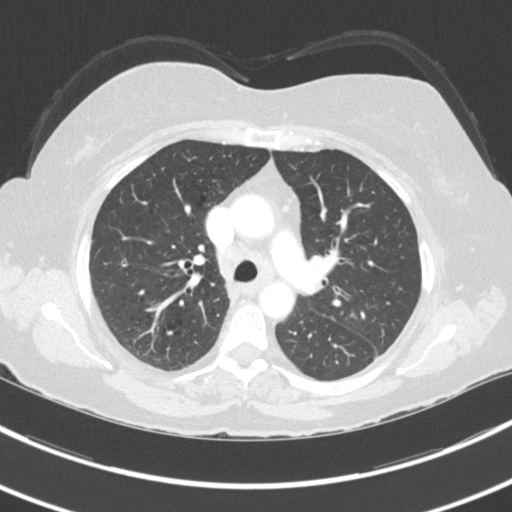
[im 102/132  mediastinal]
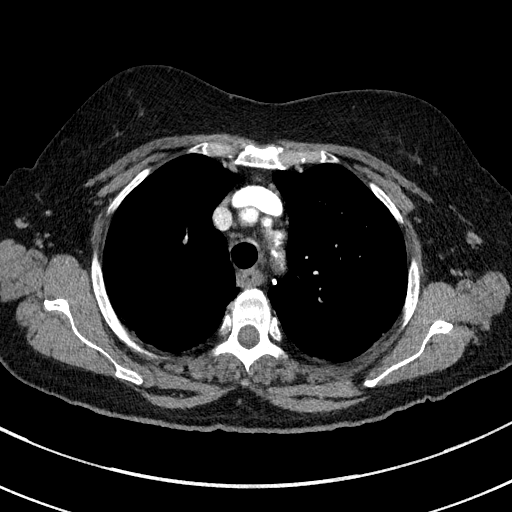
[im 102/132  lung]
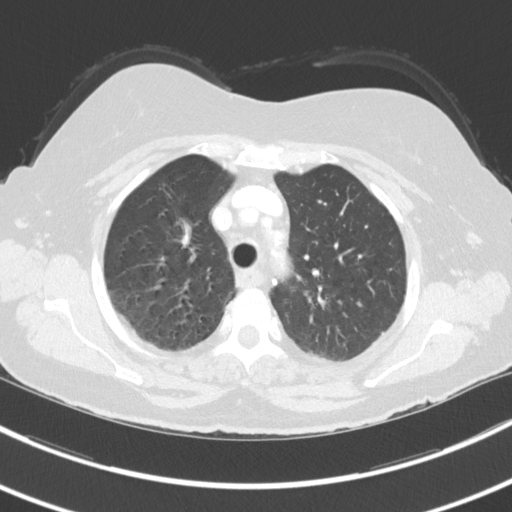
[im 112/132  lung]
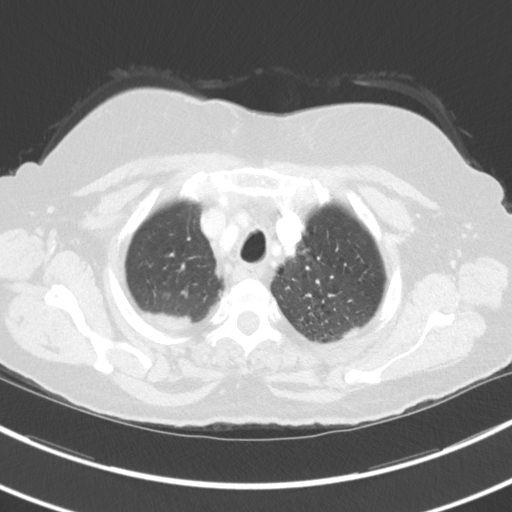
[im 122/132  lung]
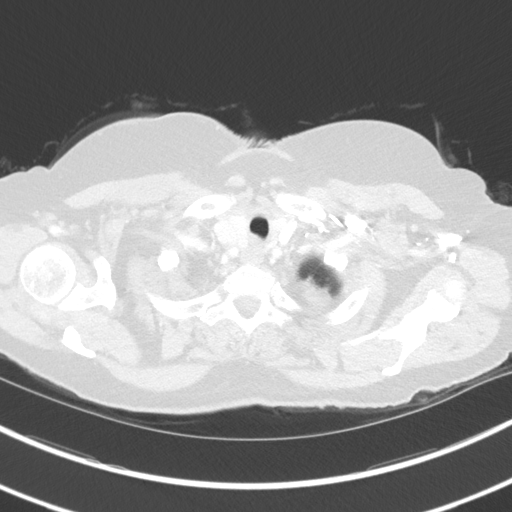

[Series 5: coronal · coronal · 0.57mm/px · 3 of 114 slices shown]
[im 23/114  lung]
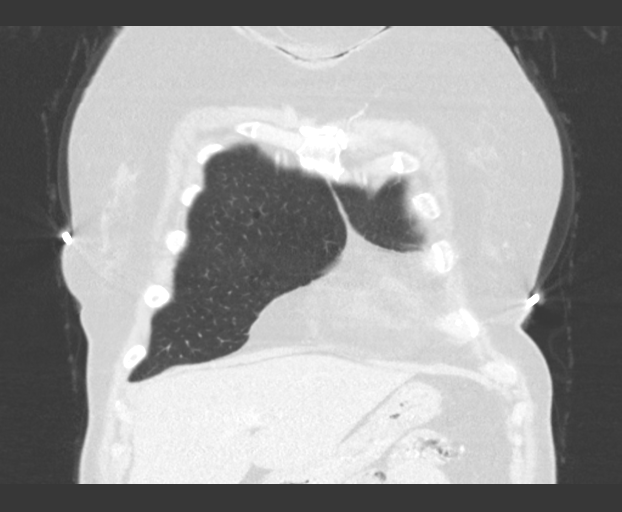
[im 46/114  lung]
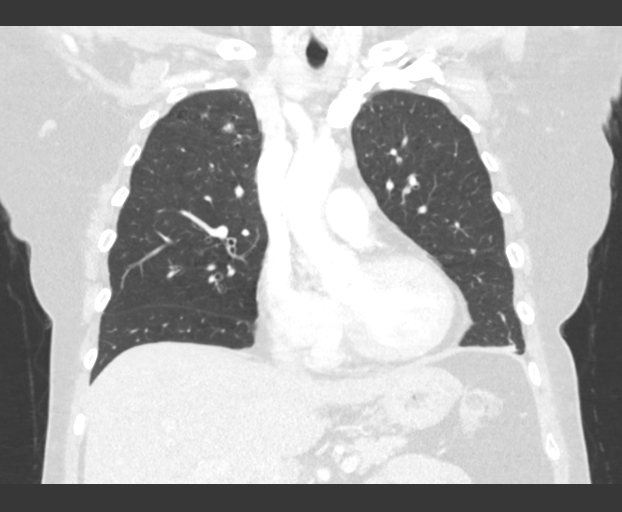
[im 68/114  lung]
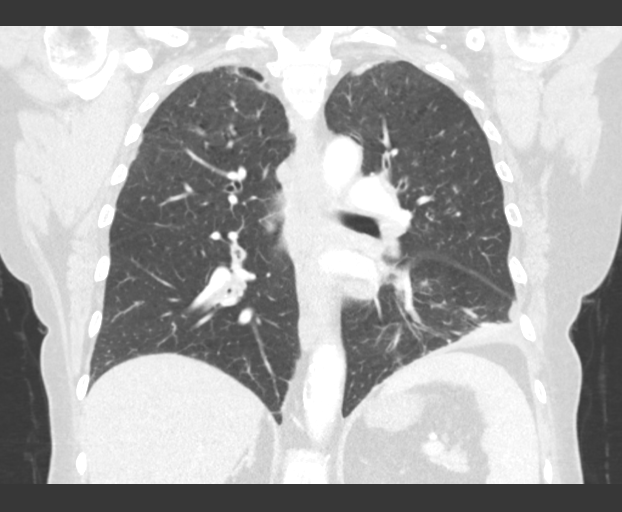

[14 of 36 positions shown; findings below may reference images not displayed]

FINDINGS: Mediastinum/Nodes: Normal heart size. No significant pericardial
fluid/thickening. Left anterior descending and right coronary
atherosclerosis. Atherosclerotic nonaneurysmal thoracic aorta.
Normal caliber pulmonary arteries. No central pulmonary emboli. No
discrete thyroid nodules. Unremarkable esophagus. No axillary
adenopathy. Stable mildly enlarged 1.0 cm right paratracheal node
(series 2/ image 43). Stable mildly enlarged 1.0 cm subcarinal node
(series 2/ image 61). Stable mildly enlarged 1.1 cm left
paratracheal node (series 2/ image 42). Stable mildly enlarged
cm left hilar node (series 2/ image 63). No additional
pathologically enlarged mediastinal or hilar nodes.

Lungs/Pleura: No pneumothorax. No pleural effusion. Mild
centrilobular and paraseptal emphysema and mild diffuse bronchial
wall thickening. Solid right upper lobe 7 x 6 mm pulmonary nodule
(series 3/ image 35), previously 7 x 6 mm back to [DATE] chest
CT, stable. Patchy tiny centrilobular ground-glass pulmonary nodules
in both lungs, upper lung predominant, measuring up to 6 mm in the
right upper lobe (series 3/image 30), stable. Calcified
subcentimeter right lung granuloma associated with the superior
right major fissure. Parenchymal band at the basilar left lower
lobe, consistent with postinfectious/ postinflammatory scarring. No
acute consolidative airspace disease, lung masses or additional
significant solid pulmonary nodules.

Upper abdomen: Subcentimeter hypodense left liver lobe lesion is too
small to characterize and stable. Stable partially visualized left
adrenal 3.2 cm myelolipoma.

Musculoskeletal: No aggressive appearing focal osseous lesions.
Moderate thoracic spondylosis.
IMPRESSION: 1. No residual pleural effusion. Postinfectious scarring at the left
lung base. No new or residual consolidative airspace disease.
2. Solid 7 mm right upper lobe pulmonary nodule, stable back to
[DATE], consistent with a benign nodule.
3. Patchy tiny centrilobular ground-glass pulmonary nodules in both
lungs, upper lung predominant, stable, favored to be due to smoking
related interstitial lung disease if the patient is a current smoker
(as per [REDACTED] records).
4. Stable nonspecific mild mediastinal and left hilar adenopathy.
5. Mild centrilobular and paraseptal emphysema and mild diffuse
bronchial wall thickening, suggesting COPD.
6. Additional findings include aortic atherosclerosis, 2 vessel
coronary atherosclerosis and stable left adrenal myelolipoma.

## 2016-01-13 MED ORDER — IOPAMIDOL (ISOVUE-300) INJECTION 61%
75.0000 mL | Freq: Once | INTRAVENOUS | Status: AC | PRN
  Administered 2016-01-13: 75 mL via INTRAVENOUS

## 2016-01-14 ENCOUNTER — Ambulatory Visit (INDEPENDENT_AMBULATORY_CARE_PROVIDER_SITE_OTHER): Payer: Medicare HMO | Admitting: Cardiothoracic Surgery

## 2016-01-14 ENCOUNTER — Encounter: Payer: Self-pay | Admitting: Cardiothoracic Surgery

## 2016-01-14 VITALS — BP 146/79 | HR 103 | Temp 98.3°F | Wt 132.0 lb

## 2016-01-14 DIAGNOSIS — R911 Solitary pulmonary nodule: Secondary | ICD-10-CM

## 2016-01-14 NOTE — Progress Notes (Signed)
This patient returns today in follow-up. She states that over the last several days she developed some coughing with some chest tightness and shortness of breath. She has been taking Mucinex for it. She denied any fevers or chills. She's not seen her primary care physician for this particular problem but she did have a chest CT scan made yesterday.  When I entered the room I explained to the patient that her CT scan showed no obvious pneumonia on it. While I was in the process of reviewing her CT scan results with her the patient asked if I could call in some antibiotics for her. I reviewed suggested that she contact her primary care physician for that and she promptly left the room. I explained her that I was not the kind of physician who would manage chronic bronchitis and she obviously became upset and left the room without any further discussion. She left the office without making any appointments.  We will send her a letter requesting that she follow-up with her primary care physician for her bronchitis and her chronic lung disease.

## 2016-01-26 ENCOUNTER — Encounter: Payer: Self-pay | Admitting: Physician Assistant

## 2016-01-27 ENCOUNTER — Encounter: Payer: Self-pay | Admitting: Physician Assistant

## 2016-01-28 ENCOUNTER — Telehealth: Payer: Self-pay | Admitting: *Deleted

## 2016-01-28 ENCOUNTER — Encounter: Payer: Self-pay | Admitting: Physician Assistant

## 2016-01-28 ENCOUNTER — Ambulatory Visit: Payer: Medicare HMO | Admitting: Physician Assistant

## 2016-01-28 DIAGNOSIS — E785 Hyperlipidemia, unspecified: Secondary | ICD-10-CM

## 2016-01-28 HISTORY — DX: Hyperlipidemia, unspecified: E78.5

## 2016-01-28 NOTE — Telephone Encounter (Signed)
Patient was scheduled for visit today with APP at 8:30 am.  She was in a 30 min appt slot but is a new patient to the practice so she would have been a 60 min  appt per provider, Kathryn Copa, PA-C   Pt arrived accompanied by a young man at 9:02 am and was informed she would have to reschedule.   Pt became tearful at checkout.  She was offered an 11:30 appointment today with same provider, or reschedule for Monday with a different APP.  I discussed with patient, she stated that she was not going to wait, unless we felt it was emergent that she be seen today.   Advised patient it is her choice which appointment she chooses, and if she needed seen emergently, we would rec ER or urgent care.  Pt remained slightly tearful, but in no acute distress.  The person with her suggested they go eat and come back.  She decided to come back Monday at 1:45 pm.  Pt has not had a new patient appointment as of this time.  She is listed as NO SHOW for new pt visit with Dr. Irish Lack on 12/09/15.

## 2016-01-28 NOTE — Progress Notes (Deleted)
Cardiology Office Note    Date:  01/28/2016  ID:  KATLEEN NESHEIM, DOB 03/27/1946, MRN [id number] PCP:  Imagene Riches, NP  Cardiologist:  Remotely Dr. Irish Lack  ***refresh  - This is a preliminary note being prepped for an upcoming appointment.   Chief Complaint: f/u CAD***  History of Present Illness:  Kathryn Garrett is a 70 y.o. female with history of VF arrest 01/2009/CAD with inferoposterior MI s/p aspiration thrombectomy/BMS of RCA at that time, ISR of BMS s/p DES to RCA 06/2010 with moderate LM/LAD disease not significant by FFR), chronic combined CHF (EF 40-45% by cath 2010, 60-65% with grade 1 DD by echo 09/2015), COPD, tobacco abuse, PVD (mild atherosclerosis of infrarenal aorta, 25% ostial left renal artery stenosis, 50% ostial right common iliac by cath 2010), GERD, prior narcotic abuse, pulm nodule who presents for f/u. She was admitted 09/2015 with sepsis felt due to CAP/parapneumonic pleural effusion/empyema with cytology negative for malignancy, s/p PleurX catheter. CT Chest 01/13/16 without residual effusion, + post infectious scaring LLL, solid 63mm RUL pulm nodule c/w benign nodule, ground glass pulm nodules in both lungs favored to be related to interstitial lung disease, stable nonspecific mild mediastinal and left hilar adenopathy, COPD, aortic atherosclerosis/coronary atherosclerosis. 2D echo 09/2015: EF 60-65%, no RWMA, grade 1 DD, normal RV, normal PASP. Last labs 10/2015 showed Na 140, K 4.2, glu 110, albumin 4.0, LFTs wnl, Tchol 192, trig 214, HDL 38, LDL 123, WBC 8.8, Hgb 12.1, Hct 37.1, A1C 6.0, BNP 38. There is an OV from 01/14/16 which outlines the patient following up with general surgery to review chest CT results in which she requested antibiotics in absence of evidence of recurrent PNA, became upset and left the provider visit.   Chol too high    Past Medical History:  Diagnosis Date  . Arthritis   . CAD (coronary artery disease)    a. VF arrest 01/2009/CAD with  inferoposterior MI s/p aspiration thrombectomy/BMS of RCA at that time. b. ISR of BMS s/p DES to RCA 06/2010 with moderate LM/LAD disease not significant by FFR.  . Cardiac arrest ([place]) 01/2009   a. in setting of inf-post STEMI 01/2009 (VF).  . Chronic combined systolic and diastolic CHF (congestive heart failure) ([place])    a. EF 40-45% by cath 2010. b. 60-65% with grade 1 DD by echo 09/2015  . COPD (chronic obstructive pulmonary disease) ([place])   . Hypertension   . MI (myocardial infarction) ([place]) 2009  . Narcotic abuse    pt now taking Suboxone tid  . Pulmonary nodule   . PVD (peripheral vascular disease) ([facility])    mild atherosclerosis of infrarenal aorta, 25% ostial left renal artery stenosis, 50% ostial right common iliac by cath 2010  . Tobacco abuse     Past Surgical History:  Procedure Laterality Date  . ABDOMINAL HYSTERECTOMY    . CHEST TUBE INSERTION N/A 10/08/2015   Procedure: PLEURX CATH REMOVAL;  Surgeon: [name], MD;  Location: [facility] ORS;  Service: Thoracic;  Laterality: N/A;  . CORONARY ANGIOPLASTY WITH STENT PLACEMENT    . VIDEO ASSISTED THORACOSCOPY (VATS)/THOROCOTOMY Left 09/29/2015   Procedure: PREOP BRONCHOSCOPY, LEFT THORACOSCOPY, POSSIBLE THORACOTOMY, PLEURAL BIOPSY, TALC;  Surgeon: [name], MD;  Location: [facility] ORS;  Service: General;  Laterality: Left;    Current Medications: Current Outpatient Prescriptions  Medication Sig Dispense Refill  . albuterol (PROVENTIL HFA;VENTOLIN HFA) 108 (90 Base) MCG/ACT inhaler Inhale 2 puffs into the lungs as needed.    Marland Kitchen  buprenorphine-naloxone (SUBOXONE) 2-0.5 MG SUBL SL tablet Place 1 tablet under the tongue 3 (three) times daily.    . CHANTIX STARTING MONTH PAK 0.5 MG X 11 & 1 MG X 42 tablet Take 1 tablet by mouth See admin instructions. see package  0  . clopidogrel (PLAVIX) 75 MG tablet Take 1 tablet by mouth daily.    . metoprolol tartrate (LOPRESSOR) 25 MG tablet Take 1 tablet by mouth 2 (two) times daily.    . VENTOLIN  HFA 108 (90 Base) MCG/ACT inhaler Inhale 2 puffs into the lungs every 6 (six) hours as needed.  1   No current facility-administered medications for this visit.      Allergies:   Azithromycin   Social History   Social History  . Marital status: Legally Separated    Spouse name: N/A  . Number of children: N/A  . Years of education: N/A   Social History Main Topics  . Smoking status: Current Every Day Smoker    Packs/day: 0.25    Types: Cigarettes  . Smokeless tobacco: Never Used  . Alcohol use No  . Drug use: No  . Sexual activity: Not on file   Other Topics Concern  . Not on file   Social History Narrative  . No narrative on file     Family History:  The patient's family history includes Cancer in her brother; Coronary artery disease in her father and mother. ***  ROS:   Please see the history of present illness. Otherwise, review of systems is positive for ***.  All other systems are reviewed and otherwise negative.    PHYSICAL EXAM:   VS:  There were no vitals taken for this visit.  BMI: There is no height or weight on file to calculate BMI. GEN: Well nourished, well developed, in no acute distress  HEENT: normocephalic, atraumatic Neck: no JVD, carotid bruits, or masses Cardiac: ***RRR; no murmurs, rubs, or gallops, no edema  Respiratory:  clear to auscultation bilaterally, normal work of breathing GI: soft, nontender, nondistended, + BS MS: no deformity or atrophy  Skin: warm and dry, no rash Neuro:  Alert and Oriented x 3, Strength and sensation are intact, follows commands Psych: euthymic mood, full affect  Wt Readings from Last 3 Encounters:  01/14/16 132 lb (59.9 kg)  10/15/15 132 lb 9.6 oz (60.1 kg)  10/08/15 133 lb (60.3 kg)      Studies/Labs Reviewed:   EKG:  EKG was ordered today and personally reviewed by me and demonstrates *** EKG was not ordered today.***  Recent Labs: 09/24/2015: B Natriuretic Peptide 30.0 09/28/2015: ALT 11; BUN 15;  Potassium 3.8; Sodium 136 09/30/2015: Hemoglobin 11.0; Platelets 360 01/13/2016: Creatinine, Ser 0.70   Lipid Panel    Component Value Date/Time   CHOL 85 03/20/2013 0557   TRIG 112 03/20/2013 0557   HDL 21 (L) 03/20/2013 0557   CHOLHDL 5.8 01/14/2009 0319   VLDL 22 03/20/2013 0557   LDLCALC 42 03/20/2013 0557    Additional studies/ records that were reviewed today include: Summarized above.***    ASSESSMENT & PLAN:   1. CAD 2. Chronic combined CHF with improved LVEF 3. Tobacco abuse 4. PVD 5. Hyperlipidemia  Disposition: F/u with ***   Medication Adjustments/Labs and Tests Ordered: Current medicines are reviewed at length with the patient today.  Concerns regarding medicines are outlined above. Medication changes, Labs and Tests ordered today are summarized above and listed in the Patient Instructions accessible in Encounters.   Signed,  Melina Copa PA-C  01/28/2016 7:41 AM    [facility] [address], [place], [place]  [address] Phone: [phone]; Fax: [phone]

## 2016-01-31 ENCOUNTER — Encounter (INDEPENDENT_AMBULATORY_CARE_PROVIDER_SITE_OTHER): Payer: Self-pay

## 2016-01-31 ENCOUNTER — Other Ambulatory Visit: Payer: Self-pay | Admitting: Physician Assistant

## 2016-01-31 ENCOUNTER — Encounter: Payer: Self-pay | Admitting: Physician Assistant

## 2016-01-31 ENCOUNTER — Encounter: Payer: Self-pay | Admitting: *Deleted

## 2016-01-31 ENCOUNTER — Ambulatory Visit (INDEPENDENT_AMBULATORY_CARE_PROVIDER_SITE_OTHER): Payer: Medicare HMO | Admitting: Physician Assistant

## 2016-01-31 VITALS — BP 146/78 | HR 103 | Ht 59.0 in | Wt 132.4 lb

## 2016-01-31 DIAGNOSIS — R079 Chest pain, unspecified: Secondary | ICD-10-CM | POA: Diagnosis not present

## 2016-01-31 DIAGNOSIS — I25119 Atherosclerotic heart disease of native coronary artery with unspecified angina pectoris: Secondary | ICD-10-CM

## 2016-01-31 DIAGNOSIS — Z720 Tobacco use: Secondary | ICD-10-CM

## 2016-01-31 DIAGNOSIS — I10 Essential (primary) hypertension: Secondary | ICD-10-CM | POA: Diagnosis not present

## 2016-01-31 DIAGNOSIS — E785 Hyperlipidemia, unspecified: Secondary | ICD-10-CM | POA: Diagnosis not present

## 2016-01-31 LAB — CBC WITH DIFFERENTIAL/PLATELET
BASOS PCT: 0 %
Basophils Absolute: 0 cells/uL (ref 0–200)
EOS ABS: 115 {cells}/uL (ref 15–500)
Eosinophils Relative: 1 %
HCT: 37.4 % (ref 35.0–45.0)
HEMOGLOBIN: 12.4 g/dL (ref 11.7–15.5)
LYMPHS PCT: 28 %
Lymphs Abs: 3220 cells/uL (ref 850–3900)
MCH: 30.3 pg (ref 27.0–33.0)
MCHC: 33.2 g/dL (ref 32.0–36.0)
MCV: 91.4 fL (ref 80.0–100.0)
MONO ABS: 1035 {cells}/uL — AB (ref 200–950)
MPV: 9.6 fL (ref 7.5–12.5)
Monocytes Relative: 9 %
NEUTROS ABS: 7130 {cells}/uL (ref 1500–7800)
Neutrophils Relative %: 62 %
Platelets: 341 10*3/uL (ref 140–400)
RBC: 4.09 MIL/uL (ref 3.80–5.10)
RDW: 13.8 % (ref 11.0–15.0)
WBC: 11.5 10*3/uL — AB (ref 3.8–10.8)

## 2016-01-31 MED ORDER — METOPROLOL TARTRATE 25 MG PO TABS: 25.0000 mg | ORAL_TABLET | Freq: Two times a day (BID) | ORAL | 3 refills | Status: DC

## 2016-01-31 MED ORDER — CLOPIDOGREL BISULFATE 75 MG PO TABS: 75.0000 mg | ORAL_TABLET | Freq: Every day | ORAL | 3 refills | Status: DC

## 2016-01-31 MED ORDER — ASPIRIN EC 325 MG PO TBEC: 325.0000 mg | DELAYED_RELEASE_TABLET | Freq: Every day | ORAL | 11 refills | Status: DC

## 2016-01-31 NOTE — Patient Instructions (Signed)
Medication Instructions:  Your physician recommends that you continue on your current medications as directed. Please refer to the Current Medication list given to you today.   Labwork: TODAY:  BMET, CBC W/DIFF, & PT/INR  Testing/Procedures: Your physician has requested that you have a cardiac catheterization. Cardiac catheterization is used to diagnose and/or treat various heart conditions. Doctors may recommend this procedure for a number of different reasons. The most common reason is to evaluate chest pain. Chest pain can be a symptom of coronary artery disease (CAD), and cardiac catheterization can show whether plaque is narrowing or blocking your heart's arteries. This procedure is also used to evaluate the valves, as well as measure the blood flow and oxygen levels in different parts of your heart. For further information please visit www.cardiosmart.org. Please follow instruction sheet, as given.    Follow-Up: Your physician recommends that you schedule a follow-up appointment in: WILL BE SET UP AT DISCHARGE   Any Other Special Instructions Will Be Listed Below (If Applicable).  Coronary Angiogram A coronary angiogram, also called coronary angiography, is an X-ray procedure used to look at the arteries in the heart. In this procedure, a dye (contrast dye) is injected through a long, hollow tube (catheter). The catheter is about the size of a piece of cooked spaghetti and is inserted through your groin, wrist, or arm. The dye is injected into each artery, and X-rays are then taken to show if there is a blockage in the arteries of your heart. LET YOUR HEALTH CARE PROVIDER KNOW ABOUT:  Any allergies you have, including allergies to shellfish or contrast dye.   All medicines you are taking, including vitamins, herbs, eye drops, creams, and over-the-counter medicines.   Previous problems you or members of your family have had with the use of anesthetics.   Any blood disorders you  have.   Previous surgeries you have had.  History of kidney problems or failure.   Other medical conditions you have. RISKS AND COMPLICATIONS  Generally, a coronary angiogram is a safe procedure. However, problems can occur and include:  Allergic reaction to the dye.  Bleeding from the access site or other locations.  Kidney injury, especially in people with impaired kidney function.  Stroke (rare).  Heart attack (rare). BEFORE THE PROCEDURE   Do not eat or drink anything after midnight the night before the procedure or as directed by your health care provider.   Ask your health care provider about changing or stopping your regular medicines. This is especially important if you are taking diabetes medicines or blood thinners. PROCEDURE  You may be given a medicine to help you relax (sedative) before the procedure. This medicine is given through an intravenous (IV) access tube that is inserted into one of your veins.   The area where the catheter will be inserted will be washed and shaved. This is usually done in the groin but may be done in the fold of your arm (near your elbow) or in the wrist.   A medicine will be given to numb the area where the catheter will be inserted (local anesthetic).   The health care provider will insert the catheter into an artery. The catheter will be guided by using a special type of X-ray (fluoroscopy) of the blood vessel being examined.   A special dye will then be injected into the catheter, and X-rays will be taken. The dye will help to show where any narrowing or blockages are located in the heart arteries.    AFTER THE PROCEDURE   If the procedure is done through the leg, you will be kept in bed lying flat for several hours. You will be instructed to not bend or cross your legs.  The insertion site will be checked frequently.   The pulse in your feet or wrist will be checked frequently.   Additional blood tests, X-rays, and an  electrocardiogram may be done.    This information is not intended to replace advice given to you by your health care provider. Make sure you discuss any questions you have with your health care provider.   Document Released: 11/05/2002 Document Revised: 05/22/2014 Document Reviewed: 09/23/2012 Elsevier Interactive Patient Education 2016 [employer]    If you need a refill on your cardiac medications before your next appointment, please call your pharmacy.   

## 2016-01-31 NOTE — Progress Notes (Signed)
Cardiology Office Note    Date:  01/31/2016   ID:  Kathryn Garrett, DOB 1945-07-09, MRN [id number]  PCP:  Imagene Riches, NP  Cardiologist: Dr. Irish Lack   No chief complaint on file.   History of Present Illness:  Kathryn Garrett is a 70 y.o. female history of CAD status post inferior MI treated with bare-metal stent to the RCA in 2010. status post DES to the proximal to mid RCA 06/2010 for in stent restenosis of the bare-metal stent. She had moderate left main/LAD disease which was not significant.  She had admission to the hospital 10/01/15 with sepsis felt secondary to pneumonia. She had left sided pleural effusion and underwent left thoracentesis which was negative for malignancy. Had some sinus tachycardia resolved with IV fluids and low-dose metoprolol. 2-D echo that admission showed normal LVEF 60-65% with grade 1 DD.  Complains of left arm pain with activity-doing laundry, dishes, cooking. Always eases with SL NTG. Occurs about once a month for the past year. Ran out of Plavix and metoprolol for 1 month. 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 Still smoking less than a pack a day. Using chantix but makes her nauseated. CT scan 01/13/16 showed aortic atherosclerosis and 2 vessel coronary atherosclerosis.  Past Medical History:  Diagnosis Date  . Arthritis   . CAD (coronary artery disease)    a. VF arrest 01/2009/CAD with inferoposterior MI s/p aspiration thrombectomy/BMS of RCA at that time. b. ISR of BMS s/p DES to RCA 06/2010 with moderate LM/LAD disease not significant by FFR.  . Cardiac arrest ([place]) 01/2009   a. in setting of inf-post STEMI 01/2009 (VF).  . Chronic combined systolic and diastolic CHF (congestive heart failure) ([place])    a. EF 40-45% by cath 2010. b. 60-65% with grade 1 DD by echo 09/2015  . COPD (chronic obstructive pulmonary disease) ([place])   . Hyperlipidemia 01/28/2016  . Hypertension   . MI (myocardial infarction) ([place]) 2009  . Narcotic  abuse    pt now taking Suboxone tid  . Pulmonary nodule   . PVD (peripheral vascular disease) ([facility])    mild atherosclerosis of infrarenal aorta, 25% ostial left renal artery stenosis, 50% ostial right common iliac by cath 2010  . Tobacco abuse     Past Surgical History:  Procedure Laterality Date  . ABDOMINAL HYSTERECTOMY    . CHEST TUBE INSERTION N/A 10/08/2015   Procedure: PLEURX CATH REMOVAL;  Surgeon: [name], MD;  Location: [facility] ORS;  Service: Thoracic;  Laterality: N/A;  . CORONARY ANGIOPLASTY WITH STENT PLACEMENT    . VIDEO ASSISTED THORACOSCOPY (VATS)/THOROCOTOMY Left 09/29/2015   Procedure: PREOP BRONCHOSCOPY, LEFT THORACOSCOPY, POSSIBLE THORACOTOMY, PLEURAL BIOPSY, TALC;  Surgeon: [name], MD;  Location: [facility] ORS;  Service: General;  Laterality: Left;    Current Medications: Outpatient Medications Prior to Visit  Medication Sig Dispense Refill  . buprenorphine-naloxone (SUBOXONE) 2-0.5 MG SUBL SL tablet Place 1 tablet under the tongue 3 (three) times daily.    . CHANTIX STARTING MONTH PAK 0.5 MG X 11 & 1 MG X 42 tablet Take 1 tablet by mouth See admin instructions. see package  0  . VENTOLIN HFA 108 (90 Base) MCG/ACT inhaler Inhale 2 puffs into the lungs every 4 (four) hours as needed for wheezing or shortness of breath.   1  . clopidogrel (PLAVIX) 75 MG tablet Take 1 tablet by mouth daily.    . metoprolol tartrate (LOPRESSOR) 25 MG tablet Take 1  tablet by mouth 2 (two) times daily.    Marland Kitchen albuterol (PROVENTIL HFA;VENTOLIN HFA) 108 (90 Base) MCG/ACT inhaler Inhale 2 puffs into the lungs every 4 (four) hours as needed for wheezing or shortness of breath.      No facility-administered medications prior to visit.      Allergies:   Azithromycin   Social History   Social History  . Marital status: Legally Separated    Spouse name: N/A  . Number of children: N/A  . Years of education: N/A   Social History Main Topics  . Smoking status: Current Every Day Smoker     Packs/day: 0.25    Types: Cigarettes  . Smokeless tobacco: Never Used  . Alcohol use No  . Drug use: No  . Sexual activity: Not Asked   Other Topics Concern  . None   Social History Narrative  . None     Family History:  The patient's   family history includes Cancer in her brother; Coronary artery disease in her father and mother.   ROS:   Please see the history of present illness.    Review of Systems  Constitution: Positive for malaise/fatigue.  HENT: Negative.   Eyes: Negative.   Cardiovascular: Positive for chest pain, dyspnea on exertion and leg swelling.  Respiratory: Positive for wheezing.   Hematologic/Lymphatic: Negative.   Musculoskeletal: Positive for back pain and myalgias. Negative for joint pain.  Gastrointestinal: Negative.   Genitourinary: Negative.   Neurological: Negative.    All other systems reviewed and are negative.   PHYSICAL EXAM:   VS:  BP (!) 146/78   Pulse (!) 103   Ht 4\' 11"  (1.499 m)   Wt 132 lb 6.4 oz (60.1 kg)   BMI 26.74 kg/m   Physical Exam  GEN: Well nourished, well developed, in no acute distress  HEENT: normal  Neck: no JVD, carotid bruits, or masses Cardiac:RRR; C4, no murmurs, rubs Respiratory:  Decreased breath sounds with scattered rhonchi GI: soft, nontender, nondistended, + BS Ext: without cyanosis, clubbing, or edema, Good distal pulses bilaterally MS: no deformity or atrophy  Skin: warm and dry, no rash Neuro:  Alert and Oriented x 3, Strength and sensation are intact Psych: euthymic mood, full affect  Wt Readings from Last 3 Encounters:  01/31/16 132 lb 6.4 oz (60.1 kg)  01/14/16 132 lb (59.9 kg)  10/15/15 132 lb 9.6 oz (60.1 kg)      Studies/Labs Reviewed:   EKG:  EKG is ordered today.  The ekg ordered today demonstrates Tachycardia at 103 bpm nonspecific ST-T wave changes, no acute change from prior tracing  Recent Labs: 09/24/2015: B Natriuretic Peptide 30.0 09/28/2015: ALT 11; BUN 15; Potassium 3.8; Sodium  136 09/30/2015: Hemoglobin 11.0; Platelets 360 01/13/2016: Creatinine, Ser 0.70   Lipid Panel    Component Value Date/Time   CHOL 85 03/20/2013 0557   TRIG 112 03/20/2013 0557   HDL 21 (L) 03/20/2013 0557   CHOLHDL 5.8 01/14/2009 0319   VLDL 22 03/20/2013 0557   LDLCALC 42 03/20/2013 0557    Additional studies/ records that were reviewed today include:  Cardiac catheterization 06/2010 IMPRESSION: 1. Moderate left main/left anterior descending disease which was not     significant by fractional flow reserve. 2. In-stent restenosis of the bare-metal stent of the right coronary     artery.  This was successfully treated with a drug-eluting stent to     the proximal to mid right coronary artery postdilated to greater  than 3 mm in diameter.   RECOMMENDATIONS:  Continue aspirin and Plavix for at least a year.  I will follow up with her in the office.   The echo 09/2015  Study Conclusions   - Left ventricle: The cavity size was normal. Systolic function was   normal. The estimated ejection fraction was in the range of 60%   to 65%. Wall motion was normal; there were no regional wall   motion abnormalities. Doppler parameters are consistent with   abnormal left ventricular relaxation (grade 1 diastolic   dysfunction). - Right ventricle: Systolic function was normal. - Pulmonary arteries: Systolic pressure was within the normal   range.   Impressions:   - Normal study.    CT of the chest 01/13/16 IMPRESSION: 1. No residual pleural effusion. Postinfectious scarring at the left lung base. No new or residual consolidative airspace disease. 2. Solid 7 mm right upper lobe pulmonary nodule, stable back to 03/22/2013, consistent with a benign nodule. 3. Patchy tiny centrilobular ground-glass pulmonary nodules in both lungs, upper lung predominant, stable, favored to be due to smoking related interstitial lung disease if the patient is a current smoker (as per [facility] records). 4.  Stable nonspecific mild mediastinal and left hilar adenopathy. 5. Mild centrilobular and paraseptal emphysema and mild diffuse bronchial wall thickening, suggesting COPD. 6. Additional findings include aortic atherosclerosis, 2 vessel coronary atherosclerosis and stable left adrenal myelolipoma.     Electronically Signed   By: Ilona Sorrel M.D.   On: 01/13/2016 16:59       ASSESSMENT:    1. Chest pain, unspecified chest pain type   2. Coronary artery disease involving native coronary artery of native heart with angina pectoris ([place])   3. Essential hypertension   4. Hyperlipidemia   5. Current tobacco use      PLAN:  In order of problems listed above:  Left arm pain and some chest pain consistent with her prior angina relieved with sublingual nitroglycerin. Discussed with Dr.Varanasi who recommends cardiac catheterization. Resume Plavix and metoprolol. Aspirin 81 mg daily. I have reviewed the risks, indications, and alternatives to angioplasty and stenting with the patient. Risks include but are not limited to bleeding, infection, vascular injury, stroke, myocardial infection, arrhythmia, kidney injury, radiation-related injury in the case of prolonged fluoroscopy use, emergency cardiac surgery, and death. The patient understands the risks of serious complication is low (123456) and he agrees to proceed.   CAD status post inferior MI with BMS to the RCA in 2010, status post DES to the RCA 2012 for in-stent restenosis, and residual left main and LAD disease that was not significant, now with increase in angina for cardiac catheterization.  Essential hypertension blood pressure is up a bit today but she's been out of her metoprolol for a month. Resume metoprolol  Hyperlipidemia patient is not on a statin and says her cholesterol is normal. He stopped Crestor along, go. Should be on Lipitor. Be started after cath.  Tobacco abuse patient is trying to decrease smoking but is having  trouble. Chantix is making her nauseated.   Medication Adjustments/Labs and Tests Ordered: Current medicines are reviewed at length with the patient today.  Concerns regarding medicines are outlined above.  Medication changes, Labs and Tests ordered today are listed in the Patient Instructions below. Patient Instructions  Medication Instructions:  Your physician recommends that you continue on your current medications as directed. Please refer to the Current Medication list given to you today.   Labwork: TODAY:  BMET, CBC W/DIFF, & PT/INR  Testing/Procedures: Your physician has requested that you have a cardiac catheterization. Cardiac catheterization is used to diagnose and/or treat various heart conditions. Doctors may recommend this procedure for a number of different reasons. The most common reason is to evaluate chest pain. Chest pain can be a symptom of coronary artery disease (CAD), and cardiac catheterization can show whether plaque is narrowing or blocking your heart's arteries. This procedure is also used to evaluate the valves, as well as measure the blood flow and oxygen levels in different parts of your heart. For further information please visit [email or web address]. Please follow instruction sheet, as given.    Follow-Up: Your physician recommends that you schedule a follow-up appointment in: WILL BE SET UP AT DISCHARGE   Any Other Special Instructions Will Be Listed Below (If Applicable).   Coronary Angiogram A coronary angiogram, also called coronary angiography, is an X-ray procedure used to look at the arteries in the heart. In this procedure, a dye (contrast dye) is injected through a long, hollow tube (catheter). The catheter is about the size of a piece of cooked spaghetti and is inserted through your groin, wrist, or arm. The dye is injected into each artery, and X-rays are then taken to show if there is a blockage in the arteries of your heart. LET [facility] CARE  PROVIDER KNOW ABOUT:  Any allergies you have, including allergies to shellfish or contrast dye.   All medicines you are taking, including vitamins, herbs, eye drops, creams, and over-the-counter medicines.   Previous problems you or members of your family have had with the use of anesthetics.   Any blood disorders you have.   Previous surgeries you have had.  History of kidney problems or failure.   Other medical conditions you have. RISKS AND COMPLICATIONS  Generally, a coronary angiogram is a safe procedure. However, problems can occur and include:  Allergic reaction to the dye.  Bleeding from the access site or other locations.  Kidney injury, especially in people with impaired kidney function.  Stroke (rare).  Heart attack (rare). BEFORE THE PROCEDURE   Do not eat or drink anything after midnight the night before the procedure or as directed by your health care provider.   Ask your health care provider about changing or stopping your regular medicines. This is especially important if you are taking diabetes medicines or blood thinners. PROCEDURE  You may be given a medicine to help you relax (sedative) before the procedure. This medicine is given through an intravenous (IV) access tube that is inserted into one of your veins.   The area where the catheter will be inserted will be washed and shaved. This is usually done in the groin but may be done in the fold of your arm (near your elbow) or in the wrist.   A medicine will be given to numb the area where the catheter will be inserted (local anesthetic).   The health care provider will insert the catheter into an artery. The catheter will be guided by using a special type of X-ray (fluoroscopy) of the blood vessel being examined.   A special dye will then be injected into the catheter, and X-rays will be taken. The dye will help to show where any narrowing or blockages are located in the heart arteries.   AFTER THE PROCEDURE   If the procedure is done through the leg, you will be kept in bed lying flat for several hours. You will be instructed to  not bend or cross your legs.  The insertion site will be checked frequently.   The pulse in your feet or wrist will be checked frequently.   Additional blood tests, X-rays, and an electrocardiogram may be done.    This information is not intended to replace advice given to you by your health care provider. Make sure you discuss any questions you have with your health care provider.   Document Released: 11/05/2002 Document Revised: 05/22/2014 Document Reviewed: 09/23/2012 Elsevier Interactive Patient Education [employer].   If you need a refill on your cardiac medications before your next appointment, please call your pharmacy.      Signed, Ermalinda Barrios, PA-C  01/31/2016 2:50 PM    [facility] [address], [place], [place]  [address] Phone: [phone]; Fax: [phone]

## 2016-02-01 ENCOUNTER — Other Ambulatory Visit (HOSPITAL_COMMUNITY): Payer: Self-pay | Admitting: Interventional Cardiology

## 2016-02-01 DIAGNOSIS — I209 Angina pectoris, unspecified: Secondary | ICD-10-CM

## 2016-02-01 LAB — BASIC METABOLIC PANEL
BUN: 19 mg/dL (ref 7–25)
CHLORIDE: 105 mmol/L (ref 98–110)
CO2: 25 mmol/L (ref 20–31)
Calcium: 9.4 mg/dL (ref 8.6–10.4)
Creat: 0.64 mg/dL (ref 0.60–0.93)
Glucose, Bld: 86 mg/dL (ref 65–99)
POTASSIUM: 4.5 mmol/L (ref 3.5–5.3)
SODIUM: 141 mmol/L (ref 135–146)

## 2016-02-01 LAB — PROTIME-INR
INR: 1.1
PROTHROMBIN TIME: 11.5 s (ref 9.0–11.5)

## 2016-02-10 ENCOUNTER — Encounter (HOSPITAL_COMMUNITY): Payer: Self-pay | Admitting: Interventional Cardiology

## 2016-02-10 ENCOUNTER — Encounter (HOSPITAL_COMMUNITY)
Admission: RE | Disposition: A | Discharge status: Home / Self Care | Payer: Self-pay | Source: Ambulatory Visit | Attending: Interventional Cardiology

## 2016-02-10 ENCOUNTER — Ambulatory Visit (HOSPITAL_COMMUNITY)
Admission: RE | Admit: 2016-02-10 | Discharge: 2016-02-11 | Disposition: A | Discharge status: Home / Self Care | Payer: Medicare HMO | Source: Ambulatory Visit | Attending: Interventional Cardiology | Admitting: Interventional Cardiology

## 2016-02-10 DIAGNOSIS — I25119 Atherosclerotic heart disease of native coronary artery with unspecified angina pectoris: Secondary | ICD-10-CM | POA: Diagnosis not present

## 2016-02-10 DIAGNOSIS — F17210 Nicotine dependence, cigarettes, uncomplicated: Secondary | ICD-10-CM | POA: Diagnosis not present

## 2016-02-10 DIAGNOSIS — Z7982 Long term (current) use of aspirin: Secondary | ICD-10-CM | POA: Diagnosis not present

## 2016-02-10 DIAGNOSIS — I200 Unstable angina: Secondary | ICD-10-CM

## 2016-02-10 DIAGNOSIS — E785 Hyperlipidemia, unspecified: Secondary | ICD-10-CM | POA: Diagnosis not present

## 2016-02-10 DIAGNOSIS — I739 Peripheral vascular disease, unspecified: Secondary | ICD-10-CM | POA: Diagnosis not present

## 2016-02-10 DIAGNOSIS — J449 Chronic obstructive pulmonary disease, unspecified: Secondary | ICD-10-CM | POA: Diagnosis not present

## 2016-02-10 DIAGNOSIS — Z9861 Coronary angioplasty status: Secondary | ICD-10-CM

## 2016-02-10 DIAGNOSIS — I10 Essential (primary) hypertension: Secondary | ICD-10-CM | POA: Diagnosis not present

## 2016-02-10 DIAGNOSIS — T82855A Stenosis of coronary artery stent, initial encounter: Secondary | ICD-10-CM | POA: Diagnosis not present

## 2016-02-10 DIAGNOSIS — I252 Old myocardial infarction: Secondary | ICD-10-CM | POA: Insufficient documentation

## 2016-02-10 DIAGNOSIS — Y831 Surgical operation with implant of artificial internal device as the cause of abnormal reaction of the patient, or of later complication, without mention of misadventure at the time of the procedure: Secondary | ICD-10-CM | POA: Insufficient documentation

## 2016-02-10 DIAGNOSIS — R079 Chest pain, unspecified: Secondary | ICD-10-CM | POA: Diagnosis present

## 2016-02-10 DIAGNOSIS — I2510 Atherosclerotic heart disease of native coronary artery without angina pectoris: Secondary | ICD-10-CM | POA: Diagnosis present

## 2016-02-10 DIAGNOSIS — Z720 Tobacco use: Secondary | ICD-10-CM | POA: Diagnosis present

## 2016-02-10 DIAGNOSIS — R69 Illness, unspecified: Secondary | ICD-10-CM | POA: Diagnosis not present

## 2016-02-10 DIAGNOSIS — I209 Angina pectoris, unspecified: Secondary | ICD-10-CM

## 2016-02-10 HISTORY — DX: Coronary angioplasty status: Z98.61

## 2016-02-10 HISTORY — PX: CARDIAC CATHETERIZATION: SHX172

## 2016-02-10 HISTORY — DX: Unspecified chronic bronchitis: J42

## 2016-02-10 HISTORY — DX: Pneumonia, unspecified organism: J18.9

## 2016-02-10 LAB — POCT ACTIVATED CLOTTING TIME: ACTIVATED CLOTTING TIME: 318 s

## 2016-02-10 SURGERY — LEFT HEART CATH AND CORONARY ANGIOGRAPHY

## 2016-02-10 MED ORDER — MOMETASONE FURO-FORMOTEROL FUM 200-5 MCG/ACT IN AERO
2.0000 | INHALATION_SPRAY | Freq: Two times a day (BID) | RESPIRATORY_TRACT | Status: DC
  Administered 2016-02-10 – 2016-02-11 (×2): 2 via RESPIRATORY_TRACT
  Filled 2016-02-10: qty 8.8

## 2016-02-10 MED ORDER — SODIUM CHLORIDE 0.9 % IV SOLN
INTRAVENOUS | Status: AC
  Administered 2016-02-10: 14:00:00 500 mL via INTRAVENOUS

## 2016-02-10 MED ORDER — HEPARIN SODIUM (PORCINE) 1000 UNIT/ML IJ SOLN
INTRAMUSCULAR | Status: AC
  Filled 2016-02-10: qty 1

## 2016-02-10 MED ORDER — LIDOCAINE HCL (PF) 1 % IJ SOLN
INTRAMUSCULAR | Status: AC
  Filled 2016-02-10: qty 30

## 2016-02-10 MED ORDER — CLOPIDOGREL BISULFATE 75 MG PO TABS
75.0000 mg | ORAL_TABLET | Freq: Every day | ORAL | Status: DC
Start: 2016-02-11 — End: 2016-02-10

## 2016-02-10 MED ORDER — SODIUM CHLORIDE 0.9% FLUSH: 3.0000 mL | Freq: Two times a day (BID) | INTRAVENOUS | Status: DC

## 2016-02-10 MED ORDER — MIDAZOLAM HCL 2 MG/2ML IJ SOLN
INTRAMUSCULAR | Status: AC
  Filled 2016-02-10: qty 2

## 2016-02-10 MED ORDER — ASPIRIN 81 MG PO CHEW
81.0000 mg | CHEWABLE_TABLET | ORAL | Status: AC
  Administered 2016-02-10: 81 mg via ORAL

## 2016-02-10 MED ORDER — CLOPIDOGREL BISULFATE 75 MG PO TABS
75.0000 mg | ORAL_TABLET | Freq: Every day | ORAL | Status: DC
  Administered 2016-02-11: 09:00:00 75 mg via ORAL
  Filled 2016-02-10: qty 1

## 2016-02-10 MED ORDER — NITROGLYCERIN 1 MG/10 ML FOR IR/CATH LAB
INTRA_ARTERIAL | Status: AC
  Filled 2016-02-10: qty 10

## 2016-02-10 MED ORDER — VERAPAMIL HCL 2.5 MG/ML IV SOLN
INTRAVENOUS | Status: AC
  Filled 2016-02-10: qty 2

## 2016-02-10 MED ORDER — IOPAMIDOL (ISOVUE-370) INJECTION 76%
INTRAVENOUS | Status: AC
  Filled 2016-02-10: qty 100

## 2016-02-10 MED ORDER — FENTANYL CITRATE (PF) 100 MCG/2ML IJ SOLN
INTRAMUSCULAR | Status: AC
  Filled 2016-02-10: qty 2

## 2016-02-10 MED ORDER — ONDANSETRON HCL 4 MG/2ML IJ SOLN: 4.0000 mg | Freq: Four times a day (QID) | INTRAMUSCULAR | Status: DC | PRN

## 2016-02-10 MED ORDER — SODIUM CHLORIDE 0.9% FLUSH
3.0000 mL | Freq: Two times a day (BID) | INTRAVENOUS | Status: DC
  Administered 2016-02-10 – 2016-02-11 (×3): 3 mL via INTRAVENOUS

## 2016-02-10 MED ORDER — SODIUM CHLORIDE 0.9 % IV SOLN: 250.0000 mL | INTRAVENOUS | Status: DC | PRN

## 2016-02-10 MED ORDER — SODIUM CHLORIDE 0.9 % WEIGHT BASED INFUSION
1.0000 mL/kg/h | INTRAVENOUS | Status: AC
  Administered 2016-02-10: 09:00:00 1 mL/kg/h via INTRAVENOUS

## 2016-02-10 MED ORDER — HEPARIN (PORCINE) IN NACL 2-0.9 UNIT/ML-% IJ SOLN
INTRAMUSCULAR | Status: AC
  Filled 2016-02-10: qty 1500

## 2016-02-10 MED ORDER — SODIUM CHLORIDE 0.9% FLUSH: 3.0000 mL | INTRAVENOUS | Status: DC | PRN

## 2016-02-10 MED ORDER — NITROGLYCERIN 1 MG/10 ML FOR IR/CATH LAB
INTRA_ARTERIAL | Status: DC | PRN
  Administered 2016-02-10: 200 ug via INTRACORONARY
  Administered 2016-02-10: 400 ug via INTRA_ARTERIAL

## 2016-02-10 MED ORDER — CLOPIDOGREL BISULFATE 300 MG PO TABS
ORAL_TABLET | ORAL | Status: DC | PRN
  Administered 2016-02-10 (×2): 300 mg via ORAL

## 2016-02-10 MED ORDER — SODIUM CHLORIDE 0.9 % WEIGHT BASED INFUSION
3.0000 mL/kg/h | INTRAVENOUS | Status: DC
  Administered 2016-02-10: 3 mL/kg/h via INTRAVENOUS

## 2016-02-10 MED ORDER — ALBUTEROL SULFATE HFA 108 (90 BASE) MCG/ACT IN AERS: 2.0000 | INHALATION_SPRAY | RESPIRATORY_TRACT | Status: DC | PRN

## 2016-02-10 MED ORDER — ACETAMINOPHEN 500 MG PO TABS: 1000.0000 mg | ORAL_TABLET | Freq: Four times a day (QID) | ORAL | Status: DC | PRN

## 2016-02-10 MED ORDER — METOPROLOL TARTRATE 25 MG PO TABS
25.0000 mg | ORAL_TABLET | Freq: Two times a day (BID) | ORAL | Status: DC
  Administered 2016-02-11: 09:00:00 25 mg via ORAL
  Filled 2016-02-10 (×2): qty 1

## 2016-02-10 MED ORDER — HEPARIN (PORCINE) IN NACL 2-0.9 UNIT/ML-% IJ SOLN
INTRAMUSCULAR | Status: DC | PRN
  Administered 2016-02-10: 1500 mL via INTRA_ARTERIAL

## 2016-02-10 MED ORDER — LIDOCAINE HCL (PF) 1 % IJ SOLN
INTRAMUSCULAR | Status: DC | PRN
  Administered 2016-02-10: 2 mL via INTRADERMAL

## 2016-02-10 MED ORDER — ASPIRIN 81 MG PO CHEW
CHEWABLE_TABLET | ORAL | Status: AC
  Filled 2016-02-10: qty 1

## 2016-02-10 MED ORDER — HEPARIN SODIUM (PORCINE) 1000 UNIT/ML IJ SOLN
INTRAMUSCULAR | Status: DC | PRN
  Administered 2016-02-10: 3000 [IU] via INTRAVENOUS
  Administered 2016-02-10: 5000 [IU] via INTRAVENOUS

## 2016-02-10 MED ORDER — ASPIRIN 81 MG PO CHEW: 81.0000 mg | CHEWABLE_TABLET | ORAL | Status: DC

## 2016-02-10 MED ORDER — ASPIRIN 81 MG PO CHEW: 81.0000 mg | CHEWABLE_TABLET | Freq: Every day | ORAL | Status: DC

## 2016-02-10 MED ORDER — ACETAMINOPHEN 325 MG PO TABS
650.0000 mg | ORAL_TABLET | ORAL | Status: DC | PRN
  Administered 2016-02-10: 650 mg via ORAL
  Filled 2016-02-10: qty 2

## 2016-02-10 MED ORDER — ASPIRIN EC 81 MG PO TBEC
81.0000 mg | DELAYED_RELEASE_TABLET | Freq: Every day | ORAL | Status: DC
  Administered 2016-02-11: 81 mg via ORAL
  Filled 2016-02-10: qty 1

## 2016-02-10 MED ORDER — ALBUTEROL SULFATE (2.5 MG/3ML) 0.083% IN NEBU: 2.5000 mg | INHALATION_SOLUTION | RESPIRATORY_TRACT | Status: DC | PRN

## 2016-02-10 MED ORDER — CLOPIDOGREL BISULFATE 300 MG PO TABS
ORAL_TABLET | ORAL | Status: AC
  Filled 2016-02-10: qty 1

## 2016-02-10 MED ORDER — SODIUM CHLORIDE 0.9 % WEIGHT BASED INFUSION: 1.0000 mL/kg/h | INTRAVENOUS | Status: DC

## 2016-02-10 MED ORDER — FENTANYL CITRATE (PF) 100 MCG/2ML IJ SOLN
INTRAMUSCULAR | Status: DC | PRN
  Administered 2016-02-10 (×2): 25 ug via INTRAVENOUS

## 2016-02-10 MED ORDER — VERAPAMIL HCL 2.5 MG/ML IV SOLN
INTRAVENOUS | Status: DC | PRN
  Administered 2016-02-10: 08:00:00 via INTRA_ARTERIAL

## 2016-02-10 MED ORDER — MIDAZOLAM HCL 2 MG/2ML IJ SOLN
INTRAMUSCULAR | Status: DC | PRN
  Administered 2016-02-10: 2 mg via INTRAVENOUS
  Administered 2016-02-10: 1 mg via INTRAVENOUS

## 2016-02-10 MED ORDER — IOPAMIDOL (ISOVUE-370) INJECTION 76%
INTRAVENOUS | Status: DC | PRN
  Administered 2016-02-10: 80 mL via INTRA_ARTERIAL

## 2016-02-10 SURGICAL SUPPLY — 15 items
BALLN ANGIOSCULPT RX 3.0X10 (BALLOONS) ×2
BALLOON ANGIOSCULPT RX 3.0X10 (BALLOONS) ×1 IMPLANT
CATH IMPULSE 5F ANG/FL3.5 (CATHETERS) ×2 IMPLANT
CATH LAUNCHER 5F JR4 (CATHETERS) ×2 IMPLANT
DEVICE RAD COMP TR BAND LRG (VASCULAR PRODUCTS) ×2 IMPLANT
GLIDESHEATH SLEND SS 6F .021 (SHEATH) ×2 IMPLANT
KIT ENCORE 26 ADVANTAGE (KITS) ×2 IMPLANT
KIT HEART LEFT (KITS) ×2 IMPLANT
PACK CARDIAC CATHETERIZATION (CUSTOM PROCEDURE TRAY) ×2 IMPLANT
TRANSDUCER W/STOPCOCK (MISCELLANEOUS) ×2 IMPLANT
TUBING CIL FLEX 10 FLL-RA (TUBING) ×2 IMPLANT
VALVE GUARDIAN II ~~LOC~~ HEMO (MISCELLANEOUS) ×2 IMPLANT
WIRE ASAHI PROWATER 180CM (WIRE) ×2 IMPLANT
WIRE HI TORQ VERSACORE-J 145CM (WIRE) ×2 IMPLANT
WIRE SAFE-T 1.5MM-J .035X260CM (WIRE) ×2 IMPLANT

## 2016-02-10 NOTE — H&P (View-Only) (Signed)
Cardiology Office Note    Date:  01/31/2016   ID:  Kathryn Garrett, DOB 1946-01-18, MRN [id number]  PCP:  Imagene Riches, NP  Cardiologist: Dr. Irish Lack   No chief complaint on file.   History of Present Illness:  Kathryn Garrett is a 70 y.o. female history of CAD status post inferior MI treated with bare-metal stent to the RCA in 2010. status post DES to the proximal to mid RCA 06/2010 for in stent restenosis of the bare-metal stent. She had moderate left main/LAD disease which was not significant.  She had admission to the hospital 10/01/15 with sepsis felt secondary to pneumonia. She had left sided pleural effusion and underwent left thoracentesis which was negative for malignancy. Had some sinus tachycardia resolved with IV fluids and low-dose metoprolol. 2-D echo that admission showed normal LVEF 60-65% with grade 1 DD.  Complains of left arm pain with activity-doing laundry, dishes, cooking. Always eases with SL NTG. Occurs about once a month for the past year. Ran out of Plavix and metoprolol for 1 month. 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 Still smoking less than a pack a day. Using chantix but makes her nauseated. CT scan 01/13/16 showed aortic atherosclerosis and 2 vessel coronary atherosclerosis.  Past Medical History:  Diagnosis Date  . Arthritis   . CAD (coronary artery disease)    a. VF arrest 01/2009/CAD with inferoposterior MI s/p aspiration thrombectomy/BMS of RCA at that time. b. ISR of BMS s/p DES to RCA 06/2010 with moderate LM/LAD disease not significant by FFR.  . Cardiac arrest ([place]) 01/2009   a. in setting of inf-post STEMI 01/2009 (VF).  . Chronic combined systolic and diastolic CHF (congestive heart failure) ([place])    a. EF 40-45% by cath 2010. b. 60-65% with grade 1 DD by echo 09/2015  . COPD (chronic obstructive pulmonary disease) ([place])   . Hyperlipidemia 01/28/2016  . Hypertension   . MI (myocardial infarction) ([place]) 2009  . Narcotic  abuse    pt now taking Suboxone tid  . Pulmonary nodule   . PVD (peripheral vascular disease) ([facility])    mild atherosclerosis of infrarenal aorta, 25% ostial left renal artery stenosis, 50% ostial right common iliac by cath 2010  . Tobacco abuse     Past Surgical History:  Procedure Laterality Date  . ABDOMINAL HYSTERECTOMY    . CHEST TUBE INSERTION N/A 10/08/2015   Procedure: PLEURX CATH REMOVAL;  Surgeon: [name], MD;  Location: [facility] ORS;  Service: Thoracic;  Laterality: N/A;  . CORONARY ANGIOPLASTY WITH STENT PLACEMENT    . VIDEO ASSISTED THORACOSCOPY (VATS)/THOROCOTOMY Left 09/29/2015   Procedure: PREOP BRONCHOSCOPY, LEFT THORACOSCOPY, POSSIBLE THORACOTOMY, PLEURAL BIOPSY, TALC;  Surgeon: [name], MD;  Location: [facility] ORS;  Service: General;  Laterality: Left;    Current Medications: Outpatient Medications Prior to Visit  Medication Sig Dispense Refill  . buprenorphine-naloxone (SUBOXONE) 2-0.5 MG SUBL SL tablet Place 1 tablet under the tongue 3 (three) times daily.    . CHANTIX STARTING MONTH PAK 0.5 MG X 11 & 1 MG X 42 tablet Take 1 tablet by mouth See admin instructions. see package  0  . VENTOLIN HFA 108 (90 Base) MCG/ACT inhaler Inhale 2 puffs into the lungs every 4 (four) hours as needed for wheezing or shortness of breath.   1  . clopidogrel (PLAVIX) 75 MG tablet Take 1 tablet by mouth daily.    . metoprolol tartrate (LOPRESSOR) 25 MG tablet Take 1  tablet by mouth 2 (two) times daily.    Marland Kitchen albuterol (PROVENTIL HFA;VENTOLIN HFA) 108 (90 Base) MCG/ACT inhaler Inhale 2 puffs into the lungs every 4 (four) hours as needed for wheezing or shortness of breath.      No facility-administered medications prior to visit.      Allergies:   Azithromycin   Social History   Social History  . Marital status: Legally Separated    Spouse name: N/A  . Number of children: N/A  . Years of education: N/A   Social History Main Topics  . Smoking status: Current Every Day Smoker     Packs/day: 0.25    Types: Cigarettes  . Smokeless tobacco: Never Used  . Alcohol use No  . Drug use: No  . Sexual activity: Not Asked   Other Topics Concern  . None   Social History Narrative  . None     Family History:  The patient's   family history includes Cancer in her brother; Coronary artery disease in her father and mother.   ROS:   Please see the history of present illness.    Review of Systems  Constitution: Positive for malaise/fatigue.  HENT: Negative.   Eyes: Negative.   Cardiovascular: Positive for chest pain, dyspnea on exertion and leg swelling.  Respiratory: Positive for wheezing.   Hematologic/Lymphatic: Negative.   Musculoskeletal: Positive for back pain and myalgias. Negative for joint pain.  Gastrointestinal: Negative.   Genitourinary: Negative.   Neurological: Negative.    All other systems reviewed and are negative.   PHYSICAL EXAM:   VS:  BP (!) 146/78   Pulse (!) 103   Ht 4\' 11"  (1.499 m)   Wt 132 lb 6.4 oz (60.1 kg)   BMI 26.74 kg/m   Physical Exam  GEN: Well nourished, well developed, in no acute distress  HEENT: normal  Neck: no JVD, carotid bruits, or masses Cardiac:RRR; C4, no murmurs, rubs Respiratory:  Decreased breath sounds with scattered rhonchi GI: soft, nontender, nondistended, + BS Ext: without cyanosis, clubbing, or edema, Good distal pulses bilaterally MS: no deformity or atrophy  Skin: warm and dry, no rash Neuro:  Alert and Oriented x 3, Strength and sensation are intact Psych: euthymic mood, full affect  Wt Readings from Last 3 Encounters:  01/31/16 132 lb 6.4 oz (60.1 kg)  01/14/16 132 lb (59.9 kg)  10/15/15 132 lb 9.6 oz (60.1 kg)      Studies/Labs Reviewed:   EKG:  EKG is ordered today.  The ekg ordered today demonstrates Tachycardia at 103 bpm nonspecific ST-T wave changes, no acute change from prior tracing  Recent Labs: 09/24/2015: B Natriuretic Peptide 30.0 09/28/2015: ALT 11; BUN 15; Potassium 3.8; Sodium  136 09/30/2015: Hemoglobin 11.0; Platelets 360 01/13/2016: Creatinine, Ser 0.70   Lipid Panel    Component Value Date/Time   CHOL 85 03/20/2013 0557   TRIG 112 03/20/2013 0557   HDL 21 (L) 03/20/2013 0557   CHOLHDL 5.8 01/14/2009 0319   VLDL 22 03/20/2013 0557   LDLCALC 42 03/20/2013 0557    Additional studies/ records that were reviewed today include:  Cardiac catheterization 06/2010 IMPRESSION: 1. Moderate left main/left anterior descending disease which was not     significant by fractional flow reserve. 2. In-stent restenosis of the bare-metal stent of the right coronary     artery.  This was successfully treated with a drug-eluting stent to     the proximal to mid right coronary artery postdilated to greater  than 3 mm in diameter.   RECOMMENDATIONS:  Continue aspirin and Plavix for at least a year.  I will follow up with her in the office.   The echo 09/2015  Study Conclusions   - Left ventricle: The cavity size was normal. Systolic function was   normal. The estimated ejection fraction was in the range of 60%   to 65%. Wall motion was normal; there were no regional wall   motion abnormalities. Doppler parameters are consistent with   abnormal left ventricular relaxation (grade 1 diastolic   dysfunction). - Right ventricle: Systolic function was normal. - Pulmonary arteries: Systolic pressure was within the normal   range.   Impressions:   - Normal study.    CT of the chest 01/13/16 IMPRESSION: 1. No residual pleural effusion. Postinfectious scarring at the left lung base. No new or residual consolidative airspace disease. 2. Solid 7 mm right upper lobe pulmonary nodule, stable back to 03/22/2013, consistent with a benign nodule. 3. Patchy tiny centrilobular ground-glass pulmonary nodules in both lungs, upper lung predominant, stable, favored to be due to smoking related interstitial lung disease if the patient is a current smoker (as per [facility] records). 4.  Stable nonspecific mild mediastinal and left hilar adenopathy. 5. Mild centrilobular and paraseptal emphysema and mild diffuse bronchial wall thickening, suggesting COPD. 6. Additional findings include aortic atherosclerosis, 2 vessel coronary atherosclerosis and stable left adrenal myelolipoma.     Electronically Signed   By: Ilona Sorrel M.D.   On: 01/13/2016 16:59       ASSESSMENT:    1. Chest pain, unspecified chest pain type   2. Coronary artery disease involving native coronary artery of native heart with angina pectoris ([place])   3. Essential hypertension   4. Hyperlipidemia   5. Current tobacco use      PLAN:  In order of problems listed above:  Left arm pain and some chest pain consistent with her prior angina relieved with sublingual nitroglycerin. Discussed with Dr.Varanasi who recommends cardiac catheterization. Resume Plavix and metoprolol. Aspirin 81 mg daily. I have reviewed the risks, indications, and alternatives to angioplasty and stenting with the patient. Risks include but are not limited to bleeding, infection, vascular injury, stroke, myocardial infection, arrhythmia, kidney injury, radiation-related injury in the case of prolonged fluoroscopy use, emergency cardiac surgery, and death. The patient understands the risks of serious complication is low (123456) and he agrees to proceed.   CAD status post inferior MI with BMS to the RCA in 2010, status post DES to the RCA 2012 for in-stent restenosis, and residual left main and LAD disease that was not significant, now with increase in angina for cardiac catheterization.  Essential hypertension blood pressure is up a bit today but she's been out of her metoprolol for a month. Resume metoprolol  Hyperlipidemia patient is not on a statin and says her cholesterol is normal. He stopped Crestor along, go. Should be on Lipitor. Be started after cath.  Tobacco abuse patient is trying to decrease smoking but is having  trouble. Chantix is making her nauseated.   Medication Adjustments/Labs and Tests Ordered: Current medicines are reviewed at length with the patient today.  Concerns regarding medicines are outlined above.  Medication changes, Labs and Tests ordered today are listed in the Patient Instructions below. Patient Instructions  Medication Instructions:  Your physician recommends that you continue on your current medications as directed. Please refer to the Current Medication list given to you today.   Labwork: TODAY:  BMET, CBC W/DIFF, & PT/INR  Testing/Procedures: Your physician has requested that you have a cardiac catheterization. Cardiac catheterization is used to diagnose and/or treat various heart conditions. Doctors may recommend this procedure for a number of different reasons. The most common reason is to evaluate chest pain. Chest pain can be a symptom of coronary artery disease (CAD), and cardiac catheterization can show whether plaque is narrowing or blocking your heart's arteries. This procedure is also used to evaluate the valves, as well as measure the blood flow and oxygen levels in different parts of your heart. For further information please visit [email or web address]. Please follow instruction sheet, as given.    Follow-Up: Your physician recommends that you schedule a follow-up appointment in: WILL BE SET UP AT DISCHARGE   Any Other Special Instructions Will Be Listed Below (If Applicable).   Coronary Angiogram A coronary angiogram, also called coronary angiography, is an X-ray procedure used to look at the arteries in the heart. In this procedure, a dye (contrast dye) is injected through a long, hollow tube (catheter). The catheter is about the size of a piece of cooked spaghetti and is inserted through your groin, wrist, or arm. The dye is injected into each artery, and X-rays are then taken to show if there is a blockage in the arteries of your heart. LET [facility] CARE  PROVIDER KNOW ABOUT:  Any allergies you have, including allergies to shellfish or contrast dye.   All medicines you are taking, including vitamins, herbs, eye drops, creams, and over-the-counter medicines.   Previous problems you or members of your family have had with the use of anesthetics.   Any blood disorders you have.   Previous surgeries you have had.  History of kidney problems or failure.   Other medical conditions you have. RISKS AND COMPLICATIONS  Generally, a coronary angiogram is a safe procedure. However, problems can occur and include:  Allergic reaction to the dye.  Bleeding from the access site or other locations.  Kidney injury, especially in people with impaired kidney function.  Stroke (rare).  Heart attack (rare). BEFORE THE PROCEDURE   Do not eat or drink anything after midnight the night before the procedure or as directed by your health care provider.   Ask your health care provider about changing or stopping your regular medicines. This is especially important if you are taking diabetes medicines or blood thinners. PROCEDURE  You may be given a medicine to help you relax (sedative) before the procedure. This medicine is given through an intravenous (IV) access tube that is inserted into one of your veins.   The area where the catheter will be inserted will be washed and shaved. This is usually done in the groin but may be done in the fold of your arm (near your elbow) or in the wrist.   A medicine will be given to numb the area where the catheter will be inserted (local anesthetic).   The health care provider will insert the catheter into an artery. The catheter will be guided by using a special type of X-ray (fluoroscopy) of the blood vessel being examined.   A special dye will then be injected into the catheter, and X-rays will be taken. The dye will help to show where any narrowing or blockages are located in the heart arteries.   AFTER THE PROCEDURE   If the procedure is done through the leg, you will be kept in bed lying flat for several hours. You will be instructed to  not bend or cross your legs.  The insertion site will be checked frequently.   The pulse in your feet or wrist will be checked frequently.   Additional blood tests, X-rays, and an electrocardiogram may be done.    This information is not intended to replace advice given to you by your health care provider. Make sure you discuss any questions you have with your health care provider.   Document Released: 11/05/2002 Document Revised: 05/22/2014 Document Reviewed: 09/23/2012 Elsevier Interactive Patient Education [employer].   If you need a refill on your cardiac medications before your next appointment, please call your pharmacy.      Signed, Ermalinda Barrios, PA-C  01/31/2016 2:50 PM    [facility] [place], [place], [place]  [address] Phone: [phone]; Fax: [phone]

## 2016-02-10 NOTE — Progress Notes (Signed)
TR BAND REMOVAL  LOCATION:  right radial  DEFLATED PER PROTOCOL:  Yes.    TIME BAND OFF / DRESSING APPLIED:   1400   SITE UPON ARRIVAL:   Level 0  SITE AFTER BAND REMOVAL:  Level 0  CIRCULATION SENSATION AND MOVEMENT:  Within Normal Limits  Yes.    COMMENTS:    

## 2016-02-10 NOTE — Progress Notes (Signed)
CARDIAC REHAB PHASE I   Pt post PTCA, in bed, BP low, receiving bolus, pt RN agreeable to ambulate with pt later this afternoon. Completed PCI education with pt and daughters at bedside.  Reviewed risk factors, tobacco cessation (gave pt fake cigarette), anti-platelet therapy, activity restrictions, ntg, exercise, heart healthy diet, and phase 2 cardiac rehab. Pt verbalized understanding, somewhat sleepy. Pt agrees to phase 2 cardiac rehab referral, will send to [facility] per pt request. Pt in bed, call bell within reach.  [place], RN, BSN 02/10/2016 3:04 PM

## 2016-02-10 NOTE — Care Management Note (Signed)
Case Management Note  Patient Details  Name: Kathryn Garrett MRN: [id number] Date of Birth: 08/25/1945  Subjective/Objective:  S/p balloon angioplasty will be on plavix.  NCM will cont to follow for dc needs.                  Action/Plan:   Expected Discharge Date:                  Expected Discharge Plan:  Home/Self Care  In-House Referral:     Discharge planning Services  CM Consult  Post Acute Care Choice:    Choice offered to:     DME Arranged:    DME Agency:     HH Arranged:    HH Agency:     Status of Service:  In process, will continue to follow  If discussed at Long Length of Stay Meetings, dates discussed:    Additional Comments:  Zenon Mayo, RN 02/10/2016, 11:36 AM

## 2016-02-10 NOTE — Interval H&P Note (Signed)
Cath Lab Visit (complete for each Cath Lab visit)  Clinical Evaluation Leading to the Procedure:   ACS: No.  Non-ACS:    Anginal Classification: CCS III  Anti-ischemic medical therapy: Minimal Therapy (1 class of medications)  Non-Invasive Test Results: No non-invasive testing performed  Prior CABG: No previous CABG      History and Physical Interval Note:  02/10/2016 7:41 AM  Dillard Cannon  has presented today for surgery, with the diagnosis of cp  The various methods of treatment have been discussed with the patient and family. After consideration of risks, benefits and other options for treatment, the patient has consented to  Procedure(s): Left Heart Cath and Coronary Angiography (N/A) as a surgical intervention .  The patient's history has been reviewed, patient examined, no change in status, stable for surgery.  I have reviewed the patient's chart and labs.  Questions were answered to the patient's satisfaction.     Larae Grooms

## 2016-02-11 ENCOUNTER — Encounter (HOSPITAL_COMMUNITY): Payer: Self-pay | Admitting: Cardiology

## 2016-02-11 DIAGNOSIS — I209 Angina pectoris, unspecified: Secondary | ICD-10-CM

## 2016-02-11 DIAGNOSIS — Z7982 Long term (current) use of aspirin: Secondary | ICD-10-CM | POA: Diagnosis not present

## 2016-02-11 DIAGNOSIS — R69 Illness, unspecified: Secondary | ICD-10-CM | POA: Diagnosis not present

## 2016-02-11 DIAGNOSIS — T82855A Stenosis of coronary artery stent, initial encounter: Secondary | ICD-10-CM | POA: Diagnosis not present

## 2016-02-11 DIAGNOSIS — I10 Essential (primary) hypertension: Secondary | ICD-10-CM | POA: Diagnosis not present

## 2016-02-11 DIAGNOSIS — E785 Hyperlipidemia, unspecified: Secondary | ICD-10-CM | POA: Diagnosis not present

## 2016-02-11 DIAGNOSIS — I739 Peripheral vascular disease, unspecified: Secondary | ICD-10-CM | POA: Diagnosis not present

## 2016-02-11 DIAGNOSIS — I252 Old myocardial infarction: Secondary | ICD-10-CM | POA: Diagnosis not present

## 2016-02-11 DIAGNOSIS — Z9861 Coronary angioplasty status: Secondary | ICD-10-CM

## 2016-02-11 DIAGNOSIS — J449 Chronic obstructive pulmonary disease, unspecified: Secondary | ICD-10-CM | POA: Diagnosis not present

## 2016-02-11 DIAGNOSIS — I25119 Atherosclerotic heart disease of native coronary artery with unspecified angina pectoris: Secondary | ICD-10-CM | POA: Diagnosis not present

## 2016-02-11 HISTORY — DX: Coronary angioplasty status: Z98.61

## 2016-02-11 LAB — CBC
HEMATOCRIT: 37.4 % (ref 36.0–46.0)
Hemoglobin: 11.8 g/dL — ABNORMAL LOW (ref 12.0–15.0)
MCH: 30.2 pg (ref 26.0–34.0)
MCHC: 31.6 g/dL (ref 30.0–36.0)
MCV: 95.7 fL (ref 78.0–100.0)
PLATELETS: 240 10*3/uL (ref 150–400)
RBC: 3.91 MIL/uL (ref 3.87–5.11)
RDW: 13.8 % (ref 11.5–15.5)
WBC: 7.5 10*3/uL (ref 4.0–10.5)

## 2016-02-11 LAB — BASIC METABOLIC PANEL
Anion gap: 4 — ABNORMAL LOW (ref 5–15)
BUN: 12 mg/dL (ref 6–20)
CHLORIDE: 113 mmol/L — AB (ref 101–111)
CO2: 23 mmol/L (ref 22–32)
CREATININE: 0.72 mg/dL (ref 0.44–1.00)
Calcium: 8.7 mg/dL — ABNORMAL LOW (ref 8.9–10.3)
GFR calc Af Amer: >60 mL/min (ref >60)
GFR calc non Af Amer: >60 mL/min (ref >60)
GLUCOSE: 122 mg/dL — AB (ref 65–99)
POTASSIUM: 4.5 mmol/L (ref 3.5–5.1)
SODIUM: 140 mmol/L (ref 135–145)

## 2016-02-11 MED ORDER — NITROGLYCERIN 0.4 MG SL SUBL: 0.4000 mg | SUBLINGUAL_TABLET | SUBLINGUAL | Status: DC | PRN

## 2016-02-11 MED ORDER — ATORVASTATIN CALCIUM 40 MG PO TABS: 40.0000 mg | ORAL_TABLET | Freq: Every day | ORAL | 6 refills | Status: DC

## 2016-02-11 MED ORDER — NITROGLYCERIN 0.4 MG SL SUBL: 0.4000 mg | SUBLINGUAL_TABLET | SUBLINGUAL | 4 refills | Status: DC | PRN

## 2016-02-11 MED ORDER — ANGIOPLASTY BOOK
Freq: Once | Status: AC
  Administered 2016-02-11: 07:00:00
  Filled 2016-02-11: qty 1

## 2016-02-11 MED ORDER — ATORVASTATIN CALCIUM 40 MG PO TABS: 40.0000 mg | ORAL_TABLET | Freq: Every day | ORAL | Status: DC

## 2016-02-11 NOTE — Discharge Instructions (Signed)
Call [facility] at [phone] if any bleeding, swelling or drainage at cath site.  May shower, no tub baths for 48 hours for groin sticks. No lifting over 5 pounds for 3 days.  No Driving for 3 days  Heart healthy diet  Do not stop Asprin and Plavix.  Bring meds with you to follow up appt.  Stop smoking

## 2016-02-11 NOTE — Progress Notes (Signed)
70 y.o. female history of CAD status post inferior MI treated with bare-metal stent to the RCA in 2010. status post DES to the proximal to mid RCA 06/2010 for in stent restenosis of the bare-metal stent. She had moderate left main/LAD disease which was not significant.  She presented with ongoing pain.     Subjective: No complaints ambulated without problems  Objective: Vital signs in last 24 hours: Temp:  [97 F (36.1 C)-97.8 F (36.6 C)] 97.6 F (36.4 C) (09/29 0821) Pulse Rate:  [56-82] 79 (09/29 0821) Resp:  [7-17] 17 (09/29 0821) BP: (74-151)/(27-74) 114/36 (09/29 0821) SpO2:  [93 %-100 %] 93 % (09/29 0821) Weight:  [136 lb 11 oz (62 kg)] 136 lb 11 oz (62 kg) (09/29 0419) Weight change: 6 lb 11 oz (3.032 kg) Last BM Date: 02/09/16 Intake/Output from previous day: 09/28 0701 - 09/29 0700 In: 735.5 [I.V.:735.5] Out: 350 [Urine:350] Intake/Output this shift: Total I/O In: 120 [P.O.:120] Out: 300 [Urine:300]  PE: General:Pleasant affect, NAD Skin:Warm and dry, brisk capillary refill HEENT:normocephalic, sclera clear, mucus membranes moist Neck:supple, no JVD  Heart:S1S2 RRR without murmur, gallup, rub or click Lungs:clear without rales, rhonchi, or wheezes [id number], non tender, + BS, do not palpate liver spleen or masses Ext:no lower ext edema, 2+ pedal pulses, 2+ radial pulses, cath site without hematoma Neuro:alert and oriented X 3, MAE, follows commands, + facial symmetry Tele:  SR  EKG  SR normal  EKG   Lab Results:  Recent Labs  02/11/16 0426  WBC 7.5  HGB 11.8*  HCT 37.4  PLT 240   BMET  Recent Labs  02/11/16 0426  NA 140  K 4.5  CL 113*  CO2 23  GLUCOSE 122*  BUN 12  CREATININE 0.72  CALCIUM 8.7*   No results for input(s): TROPONINI in the last 72 hours.  Invalid input(s): CK, MB  Lab Results  Component Value Date   CHOL 85 03/20/2013   HDL 21 (L) 03/20/2013   LDLCALC 42 03/20/2013   TRIG 112 03/20/2013   CHOLHDL 5.8  01/14/2009   No results found for: HGBA1C   Lab Results  Component Value Date   TSH 1.87 03/20/2013    Studies/Results: Cardiac cath Procedures   Coronary Balloon Angioplasty  Left Heart Cath and Coronary Angiography  Conclusion     Ost LAD to Prox LAD lesion, 40 %stenosed. Similar to prior cath.  The left ventricular systolic function is normal.  LV end diastolic pressure is normal.  The left ventricular ejection fraction is 55-65% by visual estimate.  There is no aortic valve stenosis.  Prox RCA lesion, 75 %stenosed, in-stent restenosis.  Post intervention with 3.0 x 10 Angiosculpt inflated to 3.5 mm, there is a 0% residual stenosis.   Continue aggressive secondary prevention.  Clopidogrel restarted.     Medications: I have reviewed the patient's current medications. Scheduled Meds: . aspirin EC  81 mg Oral Daily  . clopidogrel  75 mg Oral Q breakfast  . metoprolol tartrate  25 mg Oral BID  . mometasone-formoterol  2 puff Inhalation BID  . sodium chloride flush  3 mL Intravenous Q12H   Continuous Infusions:  PRN Meds:.sodium chloride, acetaminophen, albuterol, ondansetron (ZOFRAN) IV, sodium chloride flush  Assessment/Plan: Principal Problem:   Angina pectoris (HCC) Active Problems:   S/P PTCA (percutaneous transluminal coronary angioplasty) 02/10/16 to RCA lesion for in stent restenois   CAD (coronary artery disease)   HTN (hypertension)   Current tobacco use  Hyperlipidemia  No pain, feels well. VS stable.  Continue ASA plavix, BB is not on statin will place on statin- lipitor.  Her pcp has been following.  LDL goal < 70  EF 55-65%   Plan for discharge home and add statin.     LOS: 0 days   Time spent with pt. :15 minutes. Cecilie Kicks  Nurse Practitioner Certified Pager [phone] or after 5pm and on weekends call [phone] 02/11/2016, 9:35 AM  History and all data above reviewed.  Patient examined.  I agree with the findings as above.  The  patient exam reveals COR:RRR  ,  Lungs: Clear  ,  Abd: Positive bowel sounds, no rebound no guarding, Ext right wrist without bruising or bleeding  .  All available labs, radiology testing, previous records reviewed. Agree with documented assessment and plan. OK to go home.  We discussed at length the need to stop smoking and strategies.    Jeneen Rinks Susi Goslin  11:03 AM  02/11/2016

## 2016-02-11 NOTE — Discharge Summary (Signed)
Physician Discharge Summary       Patient ID: Kathryn Garrett MRN: [id number] DOB/AGE: January 04, 1946 70 y.o.  Admit date: 02/10/2016 Discharge date: 02/11/2016 Primary Cardiologist:Dr. Irish Lack   Discharge Diagnoses:  Principal Problem:   Angina pectoris [facility]) Active Problems:   S/P PTCA (percutaneous transluminal coronary angioplasty) 02/10/16 to RCA lesion for in stent restenois   CAD (coronary artery disease)   HTN (hypertension)   Current tobacco use   Hyperlipidemia LDL goal <70   Discharged Condition: good  Procedures: 02/10/16  Cardiac cath and PCI by Dr. Irish Lack Procedures   Coronary Balloon Angioplasty  Left Heart Cath and Coronary Angiography  Conclusion     Ost LAD to Prox LAD lesion, 40 %stenosed. Similar to prior cath.  The left ventricular systolic function is normal.  LV end diastolic pressure is normal.  The left ventricular ejection fraction is 55-65% by visual estimate.  There is no aortic valve stenosis.  Prox RCA lesion, 75 %stenosed, in-stent restenosis.  Post intervention with 3.0 x 10 Angiosculpt inflated to 3.5 mm, there is a 0% residual stenosis.  Continue aggressive secondary prevention. Clopidogrel restarted.    EF was normal   Hospital Course:   70 y.o. female history of CAD status post inferior MI treated with bare-metal stent to the RCA in 2010. status post DES to the proximal to mid RCA 06/2010 for in stent restenosis of the bare-metal stent. She had moderate left main/LAD disease which was not significant.  She had admission to the hospital 10/01/15 with sepsis felt secondary to pneumonia. She had left sided pleural effusion and underwent left thoracentesis which was negative for malignancy. Had some sinus tachycardia resolved with IV fluids and low-dose metoprolol. 2-D echo that admission showed normal LVEF 60-65% with grade 1 DD.  Complains of left arm pain with activity-doing laundry, dishes, cooking. Always eases with SL  NTG. Occurs about once a month for the past year. Ran out of Plavix and metoprolol for 1 month. 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 Still smoking less than a pack a day. Using chantix but makes her nauseated. CT scan 01/13/16 showed aortic atherosclerosis and 2 vessel coronary atherosclerosis.  Dr. Irish Lack arranged cardiac cath and she presented 02/10/16.  Pt had PTCA without complications.  By the morning post she walked with cardiac rehab without problems except for some wheezing that cleared with inhaler.  EKG was stable.  Pt seen and evaluated by Dr. Percival Spanish and found stable for discharge.  She will keep follow up appt with Dr.  Irish Lack.  She is on BB, she has normal EF and BP well controlled, no ACE. Will add statin.   Consults: None  Significant Diagnostic Studies:  BMP Latest Ref Rng & Units 02/11/2016 01/31/2016 01/13/2016  Glucose 65 - 99 mg/dL 122(H) 86 -  BUN 6 - 20 mg/dL 12 19 -  Creatinine 0.44 - 1.00 mg/dL 0.72 0.64 0.70  Sodium 135 - 145 mmol/L 140 141 -  Potassium 3.5 - 5.1 mmol/L 4.5 4.5 -  Chloride 101 - 111 mmol/L 113(H) 105 -  CO2 22 - 32 mmol/L 23 25 -  Calcium 8.9 - 10.3 mg/dL 8.7(L) 9.4 -   CBC Latest Ref Rng & Units 02/11/2016 01/31/2016 09/30/2015  WBC 4.0 - 10.5 K/uL 7.5 11.5(H) 12.4(H)  Hemoglobin 12.0 - 15.0 g/dL 11.8(L) 12.4 11.0(L)  Hematocrit 36.0 - 46.0 % 37.4 37.4 33.5(L)  Platelets 150 - 400 K/uL 240 341 360   Discharge Exam: Blood  pressure (!) 114/36, pulse 79, temperature 97.6 F (36.4 C), temperature source Oral, resp. rate 17, height 4\' 11"  (1.499 m), weight 136 lb 11 oz (62 kg), SpO2 93 %.  Disposition: 01-Home or Self Care  Discharge Instructions    Amb Referral to Cardiac Rehabilitation    Complete by:  As directed    Diagnosis:  PTCA       Medication List    TAKE these medications   acetaminophen 500 MG tablet Commonly known as:  TYLENOL Take 1,000 mg by mouth every 6 (six) hours as needed for headache.   ADVAIR  DISKUS 500-50 MCG/DOSE Aepb Generic drug:  Fluticasone-Salmeterol Inhale 1 puff into the lungs 2 (two) times daily.   aspirin EC 81 MG tablet Take 81 mg by mouth daily. What changed:  Another medication with the same name was removed. Continue taking this medication, and follow the directions you see here.   atorvastatin 40 MG tablet Commonly known as:  LIPITOR Take 1 tablet (40 mg total) by mouth daily at 6 PM.   clopidogrel 75 MG tablet Commonly known as:  PLAVIX Take 1 tablet (75 mg total) by mouth daily.   metoprolol tartrate 25 MG tablet Commonly known as:  LOPRESSOR Take 1 tablet (25 mg total) by mouth 2 (two) times daily.   nitroGLYCERIN 0.4 MG SL tablet Commonly known as:  NITROSTAT Place 1 tablet (0.4 mg total) under the tongue every 5 (five) minutes as needed for chest pain.   SUBOXONE 8-2 MG Film Generic drug:  Buprenorphine HCl-Naloxone HCl Place 1 Film under the tongue 3 (three) times daily.   VENTOLIN HFA 108 (90 Base) MCG/ACT inhaler Generic drug:  albuterol Inhale 2 puffs into the lungs every 4 (four) hours as needed for wheezing or shortness of breath.         Discharge Instructions: Call [facility] at [phone] if any bleeding, swelling or drainage at cath site.  May shower, no tub baths for 48 hours for groin sticks. No lifting over 5 pounds for 3 days.  No Driving for 3 days  Heart healthy diet  Do not stop Asprin and Plavix.  Bring meds with you to follow up appt  Stop smoking  Signed: Cecilie Kicks Nurse Practitioner-Certified [facility]: [facility] 02/11/2016, 10:10 AM  Time spent on discharge : 30 minutes.

## 2016-02-11 NOTE — Care Management Note (Signed)
Case Management Note  Patient Details  Name: ADANA PEDRO MRN: [id number] Date of Birth: March 13, 1946  Subjective/Objective:     S/p balloon angioplasty will be on plavix, for dc today, no needs.               Action/Plan:   Expected Discharge Date:                  Expected Discharge Plan:  Home/Self Care  In-House Referral:     Discharge planning Services  CM Consult  Post Acute Care Choice:    Choice offered to:     DME Arranged:    DME Agency:     HH Arranged:    HH Agency:     Status of Service:  Completed, signed off  If discussed at [employer] of Stay Meetings, dates discussed:    Additional Comments:  Zenon Mayo, RN 02/11/2016, 11:47 AM

## 2016-02-11 NOTE — Progress Notes (Signed)
CARDIAC REHAB PHASE I   PRE:  Rate/Rhythm: 82 SR  BP:  Sitting: 114/36        SaO2: 94 RA  MODE:  Ambulation: 500 ft   POST:  Rate/Rhythm: 110 ST  BP:  Sitting: 163/39         SaO2: 94 RA  Pt ambulated 500 ft on RA, independent, steady gait, tolerated fairly well. Pt c/o moderate DOE, wheezing, cough with ambulation, denies any other complaints, requesting inhaler, RN notified. Reinforced tobacco cessation, activity restrictions, medication compliance. Pt verbalizes understanding, states she has no additional questions at this time. Pt to bed per pt request after walk, call bell within reach.  [id number] Lenna Sciara, RN, BSN 02/11/2016 8:41 AM

## 2016-02-24 ENCOUNTER — Ambulatory Visit: Payer: Medicare HMO | Admitting: Interventional Cardiology

## 2016-02-26 DIAGNOSIS — Z23 Encounter for immunization: Secondary | ICD-10-CM | POA: Diagnosis not present

## 2016-03-13 ENCOUNTER — Ambulatory Visit (INDEPENDENT_AMBULATORY_CARE_PROVIDER_SITE_OTHER): Payer: Medicare HMO | Admitting: Physician Assistant

## 2016-03-13 ENCOUNTER — Encounter: Payer: Self-pay | Admitting: Physician Assistant

## 2016-03-13 VITALS — BP 120/80 | HR 72 | Ht 59.0 in | Wt 132.0 lb

## 2016-03-13 DIAGNOSIS — I10 Essential (primary) hypertension: Secondary | ICD-10-CM | POA: Diagnosis not present

## 2016-03-13 DIAGNOSIS — I2510 Atherosclerotic heart disease of native coronary artery without angina pectoris: Secondary | ICD-10-CM

## 2016-03-13 DIAGNOSIS — M79604 Pain in right leg: Secondary | ICD-10-CM

## 2016-03-13 DIAGNOSIS — E785 Hyperlipidemia, unspecified: Secondary | ICD-10-CM | POA: Diagnosis not present

## 2016-03-13 DIAGNOSIS — Z720 Tobacco use: Secondary | ICD-10-CM | POA: Diagnosis not present

## 2016-03-13 NOTE — Progress Notes (Signed)
Cardiology Office Note    Date:  03/13/2016  ID:  [name], DOB Oct 05, 1945, MRN [id number] PCP:  Imagene Riches, NP  Cardiologist:  Irish Lack   Chief Complaint: f/u cath  History of Present Illness:  Kathryn Garrett is a 70 y.o. female with history of VF arrest 01/2009/CAD with inferoposterior MI s/p aspiration thrombectomy/BMS of RCA at that time, ISR of BMS s/p DES to RCA 06/2010, PTCA to ISR of prox RCA 01/2016, chronic combined CHF (EF 40-45% by cath 2010, improved to 60-65% in 09/2015), COPD, tobacco abuse, PVD (mild atherosclerosis of infrarenal aorta, 25% ostial left renal artery stenosis, 50% ostial right common iliac by cath 2010), GERD, prior narcotic abuse, pulm nodule who presents for f/u. She was admitted 09/2015 with sepsis felt due to CAP/parapneumonic pleural effusion/empyema with cytology negative for malignancy, s/p PleurX catheter. CT Chest 01/13/16 without residual effusion, + post infectious scaring LLL, solid 25mm RUL pulm nodule c/w benign nodule, ground glass pulm nodules in both lungs favored to be related to interstitial lung disease, stable nonspecific mild mediastinal and left hilar adenopathy, COPD, aortic atherosclerosis/coronary atherosclerosis. She recently saw Dr. Irish Lack complaining of exertional left arm pain, relieved with NTG. She had run out of Plavix and metoprolol for 1 month. LHC 02/10/16: 40% ostial-prox LAD similar to prior cath, 75% prox RCA (ISR) s/p angiosculpt, LVEF 55-65%, normal LVEDP. Plavix was restarted and atorvastatin was added. Prior echo 09/2015: EF 60-65%, grade 1 DD. Lipids 10/2015: LDL 123, trig 214, HDL 38, A1C 6.0, LFTs wnl.  She returns for follow-up overall feeling great. No recurrent left arm pain or chest pain. No shortness of breath. No syncope. Reports compliance with meds. No bleeding problems. She does report an area of focal numbness on her lateral right thigh just proximal to her kneecap. Also gets occasional pain down the side of  that kneecap. She has not had any calf or hip/buttock pain with ambulation.   Past Medical History:  Diagnosis Date  . Arthritis    "qwhere" (02/10/2016)  . CAD (coronary artery disease)    a. VF arrest 01/2009/CAD with inferoposterior MI s/p aspiration thrombectomy/BMS of RCA at that time. b. ISR of BMS s/p DES to RCA 06/2010 with moderate LM/LAD disease not significant by FFR. c. s/p angiosculpt PTCA to prox RCA 01/2016 for ISR.  Marland Kitchen Cardiac arrest ([place]) 01/2009   a. in setting of inf-post STEMI 01/2009 (VF).  . Chronic bronchitis ([place])   . Chronic combined systolic and diastolic CHF (congestive heart failure) ([place])    a. EF 40-45% by cath 2010. b. 60-65% with grade 1 DD by echo 09/2015  . COPD (chronic obstructive pulmonary disease) ([place])   . Hyperlipidemia 01/28/2016  . Hypertension   . MI (myocardial infarction) 01/2009  . Narcotic abuse    pt now taking Suboxone tid  . Pneumonia 06/2014; 09/2015  . Pre-diabetes   . Pulmonary nodule   . PVD (peripheral vascular disease) ([facility])    mild atherosclerosis of infrarenal aorta, 25% ostial left renal artery stenosis, 50% ostial right common iliac by cath 2010  . S/P PTCA (percutaneous transluminal coronary angioplasty) 02/10/16 to RCA lesion for in stent restenois 02/11/2016  . Tobacco abuse     Past Surgical History:  Procedure Laterality Date  . ABDOMINAL HYSTERECTOMY    . ANKLE FRACTURE SURGERY Right 2000s  . CARDIAC CATHETERIZATION N/A 02/10/2016   Procedure: Left Heart Cath and Coronary Angiography;  Surgeon: Jettie Booze, MD;  Location: [facility];  Service: Cardiovascular;  Laterality: N/A;  . CARDIAC CATHETERIZATION N/A 02/10/2016   Procedure: Coronary Balloon Angioplasty;  Surgeon: Jettie Booze, MD;  Location: [facility];  Service: Cardiovascular;  Laterality: N/A;  instent RCA  . CHEST TUBE INSERTION N/A 10/08/2015   Procedure: PLEURX CATH REMOVAL;  Surgeon: [name], MD;  Location: [facility];  Service:  Thoracic;  Laterality: N/A;  . CORONARY ANGIOPLASTY WITH STENT PLACEMENT  01/2009; 2012  . FRACTURE SURGERY    . VIDEO ASSISTED THORACOSCOPY (VATS)/THOROCOTOMY Left 09/29/2015   Procedure: PREOP BRONCHOSCOPY, LEFT THORACOSCOPY, POSSIBLE THORACOTOMY, PLEURAL BIOPSY, TALC;  Surgeon: [name], MD;  Location: [facility] OR[facility];  Service: General;  Laterality: Left;    Current Medications: Current Outpatient Prescriptions  Medication Sig Dispense Refill  . acetaminophen (TYLENOL) 500 MG tablet Take 1,000 mg by mouth every 6 (six) hours as needed for headache.    . ADVAIR DISKUS 500-50 MCG/DOSE AEPB Inhale 1 puff into the lungs 2 (two) times daily.  0  . aspirin EC 81 MG tablet Take 81 mg by mouth daily.    Marland Kitchen atorvastatin (LIPITOR) 40 MG tablet Take 1 tablet (40 mg total) by mouth daily at 6 PM. 30 tablet 6  . clopidogrel (PLAVIX) 75 MG tablet Take 1 tablet (75 mg total) by mouth daily. 90 tablet 3  . metoprolol tartrate (LOPRESSOR) 25 MG tablet Take 1 tablet (25 mg total) by mouth 2 (two) times daily. 180 tablet 3  . nitroGLYCERIN (NITROSTAT) 0.4 MG SL tablet Place 1 tablet (0.4 mg total) under the tongue every 5 (five) minutes as needed for chest pain. 25 tablet 4  . SUBOXONE 8-2 MG FILM Place 1 Film under the tongue 3 (three) times daily.   0  . VENTOLIN HFA 108 (90 Base) MCG/ACT inhaler Inhale 2 puffs into the lungs every 4 (four) hours as needed for wheezing or shortness of breath.   1   No current facility-administered medications for this visit.      Allergies:   Azithromycin   Social History   Social History  . Marital status: Legally Separated    Spouse name: N/A  . Number of children: N/A  . Years of education: N/A   Social History Main Topics  . Smoking status: Current Every Day Smoker    Packs/day: 0.50    Years: 56.00    Types: Cigarettes  . Smokeless tobacco: Never Used  . Alcohol use No  . Drug use: No  . Sexual activity: Not Currently   Other Topics Concern  . None    Social History Narrative  . None     Family History:  The patient's family history includes Cancer in her brother; Coronary artery disease in her father and mother.   ROS:   Please see the history of present illness.  All other systems are reviewed and otherwise negative.    PHYSICAL EXAM:   VS:  BP 120/80   Pulse 72   Ht 4\' 11"  (1.499 m)   Wt 132 lb (59.9 kg)   BMI 26.66 kg/m   BMI: Body mass index is 26.66 kg/m. GEN: Well nourished, well developed WF in no acute distress  HEENT: normocephalic, atraumatic Neck: no JVD, carotid bruits, or masses Cardiac: RRR; no murmurs, rubs, or gallops, no edema, 2+ pedal pulses bilaterally Respiratory:  clear to auscultation bilaterally, normal work of breathing GI: soft, nontender, nondistended, + BS MS: no deformity or atrophy  Skin: warm and dry, no rash,  right radial cath site without hematoma or ecchymosis; good pulse. Neuro:  Alert and Oriented x 3, Strength and sensation are intact, follows commands Psych: euthymic mood, full affect  Wt Readings from Last 3 Encounters:  03/13/16 132 lb (59.9 kg)  02/11/16 136 lb 11 oz (62 kg)  01/31/16 132 lb 6.4 oz (60.1 kg)      Studies/Labs Reviewed:   EKG:  EKG was not ordered today.  Recent Labs: 09/24/2015: B Natriuretic Peptide 30.0 09/28/2015: ALT 11 02/11/2016: BUN 12; Creatinine, Ser 0.72; Hemoglobin 11.8; Platelets 240; Potassium 4.5; Sodium 140   Lipid Panel    Component Value Date/Time   CHOL 85 03/20/2013 0557   TRIG 112 03/20/2013 0557   HDL 21 (L) 03/20/2013 0557   CHOLHDL 5.8 01/14/2009 0319   VLDL 22 03/20/2013 0557   LDLCALC 42 03/20/2013 0557    Additional studies/ records that were reviewed today include: Summarized above.    ASSESSMENT & PLAN:   1. CAD as above - symptoms improved. Compliance with meds reinforced. She plans to contact cardiac rehab to enroll in the program. Continue current regimen. 2. Essential HTN - controlled. 3. Hyperlipidemia -  recently started on statin. Check CMET/lipids in another 2 weeks. If LDL is not at goal would consider further advancing Lipitor to 80mg  daily. 4. Tobacco abuse - counseled on importance of cessation. 5. Right leg pain/numbness - atypical for claudication, more suggestive of nerve compression or orthopedic issue. Good pulses in legs. Reviewed possibility of screening for PAD versus observation for continued symptoms - she would like to proceed with cardiac rehab first to see how things go. If she develops any symptoms of calf/leg pain with exertion, would proceed with ABIs/LE arterial duplex.  Disposition: F/u with Dr. Irish Lack in 3 months.   Medication Adjustments/Labs and Tests Ordered: Current medicines are reviewed at length with the patient today.  Concerns regarding medicines are outlined above. Medication changes, Labs and Tests ordered today are summarized above and listed in the Patient Instructions accessible in Encounters.   Raechel Ache PA-C  03/13/2016 3:23 PM    [facility] [address], [place], [place]  [address] Phone: [phone]; Fax: [phone]

## 2016-03-13 NOTE — Patient Instructions (Addendum)
Medication Instructions:  Your physician recommends that you continue on your current medications as directed. Please refer to the Current Medication list given to you today.   Labwork: 03/28/16:  FASTING LIPID & CMET  Testing/Procedures: None ordered  Follow-Up: Your physician recommends that you schedule a follow-up appointment in: [place] DR. VARANASI  Any Other Special Instructions Will Be Listed Below (If Applicable).   If you need a refill on your cardiac medications before your next appointment, please call your pharmacy.

## 2016-03-28 ENCOUNTER — Other Ambulatory Visit: Payer: Medicare HMO

## 2016-04-14 DIAGNOSIS — R69 Illness, unspecified: Secondary | ICD-10-CM | POA: Diagnosis not present

## 2016-04-14 DIAGNOSIS — F17290 Nicotine dependence, other tobacco product, uncomplicated: Secondary | ICD-10-CM | POA: Diagnosis not present

## 2016-04-23 DIAGNOSIS — R509 Fever, unspecified: Secondary | ICD-10-CM | POA: Diagnosis not present

## 2016-04-23 DIAGNOSIS — J209 Acute bronchitis, unspecified: Secondary | ICD-10-CM | POA: Diagnosis not present

## 2016-04-27 DIAGNOSIS — H524 Presbyopia: Secondary | ICD-10-CM | POA: Diagnosis not present

## 2016-04-27 DIAGNOSIS — H2511 Age-related nuclear cataract, right eye: Secondary | ICD-10-CM | POA: Diagnosis not present

## 2016-04-27 DIAGNOSIS — H2512 Age-related nuclear cataract, left eye: Secondary | ICD-10-CM | POA: Diagnosis not present

## 2016-04-28 DIAGNOSIS — R69 Illness, unspecified: Secondary | ICD-10-CM | POA: Diagnosis not present

## 2016-05-12 DIAGNOSIS — I10 Essential (primary) hypertension: Secondary | ICD-10-CM | POA: Diagnosis not present

## 2016-05-12 DIAGNOSIS — H2511 Age-related nuclear cataract, right eye: Secondary | ICD-10-CM | POA: Diagnosis not present

## 2016-05-12 DIAGNOSIS — H18411 Arcus senilis, right eye: Secondary | ICD-10-CM | POA: Diagnosis not present

## 2016-05-12 DIAGNOSIS — H02839 Dermatochalasis of unspecified eye, unspecified eyelid: Secondary | ICD-10-CM | POA: Diagnosis not present

## 2016-05-23 DIAGNOSIS — R69 Illness, unspecified: Secondary | ICD-10-CM | POA: Diagnosis not present

## 2016-06-04 ENCOUNTER — Inpatient Hospital Stay (HOSPITAL_COMMUNITY)
Admission: EM | Admit: 2016-06-04 | Discharge: 2016-06-06 | DRG: 251 | Disposition: A | Discharge status: Home / Self Care | Payer: Medicare HMO | Attending: Internal Medicine | Admitting: Internal Medicine

## 2016-06-04 ENCOUNTER — Emergency Department (HOSPITAL_COMMUNITY): Payer: Medicare HMO

## 2016-06-04 ENCOUNTER — Encounter (HOSPITAL_COMMUNITY): Payer: Self-pay | Admitting: Emergency Medicine

## 2016-06-04 DIAGNOSIS — I739 Peripheral vascular disease, unspecified: Secondary | ICD-10-CM | POA: Diagnosis present

## 2016-06-04 DIAGNOSIS — I252 Old myocardial infarction: Secondary | ICD-10-CM

## 2016-06-04 DIAGNOSIS — I25110 Atherosclerotic heart disease of native coronary artery with unstable angina pectoris: Secondary | ICD-10-CM | POA: Diagnosis not present

## 2016-06-04 DIAGNOSIS — I110 Hypertensive heart disease with heart failure: Secondary | ICD-10-CM | POA: Diagnosis not present

## 2016-06-04 DIAGNOSIS — I200 Unstable angina: Secondary | ICD-10-CM

## 2016-06-04 DIAGNOSIS — I10 Essential (primary) hypertension: Secondary | ICD-10-CM | POA: Diagnosis not present

## 2016-06-04 DIAGNOSIS — Y831 Surgical operation with implant of artificial internal device as the cause of abnormal reaction of the patient, or of later complication, without mention of misadventure at the time of the procedure: Secondary | ICD-10-CM | POA: Diagnosis present

## 2016-06-04 DIAGNOSIS — R0989 Other specified symptoms and signs involving the circulatory and respiratory systems: Secondary | ICD-10-CM | POA: Diagnosis not present

## 2016-06-04 DIAGNOSIS — I340 Nonrheumatic mitral (valve) insufficiency: Secondary | ICD-10-CM | POA: Diagnosis present

## 2016-06-04 DIAGNOSIS — T82855A Stenosis of coronary artery stent, initial encounter: Principal | ICD-10-CM | POA: Diagnosis present

## 2016-06-04 DIAGNOSIS — J449 Chronic obstructive pulmonary disease, unspecified: Secondary | ICD-10-CM | POA: Diagnosis not present

## 2016-06-04 DIAGNOSIS — R7303 Prediabetes: Secondary | ICD-10-CM | POA: Diagnosis present

## 2016-06-04 DIAGNOSIS — Z7982 Long term (current) use of aspirin: Secondary | ICD-10-CM

## 2016-06-04 DIAGNOSIS — Z7902 Long term (current) use of antithrombotics/antiplatelets: Secondary | ICD-10-CM

## 2016-06-04 DIAGNOSIS — Z79899 Other long term (current) drug therapy: Secondary | ICD-10-CM

## 2016-06-04 DIAGNOSIS — F17210 Nicotine dependence, cigarettes, uncomplicated: Secondary | ICD-10-CM | POA: Diagnosis present

## 2016-06-04 DIAGNOSIS — Z8674 Personal history of sudden cardiac arrest: Secondary | ICD-10-CM | POA: Diagnosis not present

## 2016-06-04 DIAGNOSIS — Z9861 Coronary angioplasty status: Secondary | ICD-10-CM

## 2016-06-04 DIAGNOSIS — Z720 Tobacco use: Secondary | ICD-10-CM

## 2016-06-04 DIAGNOSIS — R69 Illness, unspecified: Secondary | ICD-10-CM | POA: Diagnosis not present

## 2016-06-04 DIAGNOSIS — I5042 Chronic combined systolic (congestive) and diastolic (congestive) heart failure: Secondary | ICD-10-CM | POA: Diagnosis present

## 2016-06-04 DIAGNOSIS — R079 Chest pain, unspecified: Secondary | ICD-10-CM | POA: Diagnosis present

## 2016-06-04 DIAGNOSIS — M1990 Unspecified osteoarthritis, unspecified site: Secondary | ICD-10-CM | POA: Diagnosis present

## 2016-06-04 DIAGNOSIS — E785 Hyperlipidemia, unspecified: Secondary | ICD-10-CM | POA: Diagnosis not present

## 2016-06-04 DIAGNOSIS — R0789 Other chest pain: Secondary | ICD-10-CM | POA: Diagnosis not present

## 2016-06-04 DIAGNOSIS — I2510 Atherosclerotic heart disease of native coronary artery without angina pectoris: Secondary | ICD-10-CM | POA: Diagnosis present

## 2016-06-04 HISTORY — DX: Chest pain, unspecified: R07.9

## 2016-06-04 LAB — CBC
HCT: 38.8 % (ref 36.0–46.0)
Hemoglobin: 12.9 g/dL (ref 12.0–15.0)
MCH: 31.3 pg (ref 26.0–34.0)
MCHC: 33.2 g/dL (ref 30.0–36.0)
MCV: 94.2 fL (ref 78.0–100.0)
PLATELETS: 295 10*3/uL (ref 150–400)
RBC: 4.12 MIL/uL (ref 3.87–5.11)
RDW: 13.2 % (ref 11.5–15.5)
WBC: 9.2 10*3/uL (ref 4.0–10.5)

## 2016-06-04 LAB — BASIC METABOLIC PANEL
Anion gap: 8 (ref 5–15)
BUN: 9 mg/dL (ref 6–20)
CALCIUM: 9.3 mg/dL (ref 8.9–10.3)
CHLORIDE: 107 mmol/L (ref 101–111)
CO2: 25 mmol/L (ref 22–32)
CREATININE: 0.68 mg/dL (ref 0.44–1.00)
GFR calc non Af Amer: >60 mL/min (ref >60)
GLUCOSE: 98 mg/dL (ref 65–99)
Potassium: 4.3 mmol/L (ref 3.5–5.1)
Sodium: 140 mmol/L (ref 135–145)

## 2016-06-04 LAB — I-STAT TROPONIN, ED: TROPONIN I, POC: 0 ng/mL (ref 0.00–0.08)

## 2016-06-04 LAB — TROPONIN I

## 2016-06-04 IMAGING — DX DG CHEST 2V
2 series · 2 of 2 positions shown · non-contrast
Comparison: [DATE]

CLINICAL DATA: Left arm pain over the last 4 days. No chest pain.
Previous history of myocardial infarction and stents.

EXAM:
CHEST  2 VIEW

[chest pa]
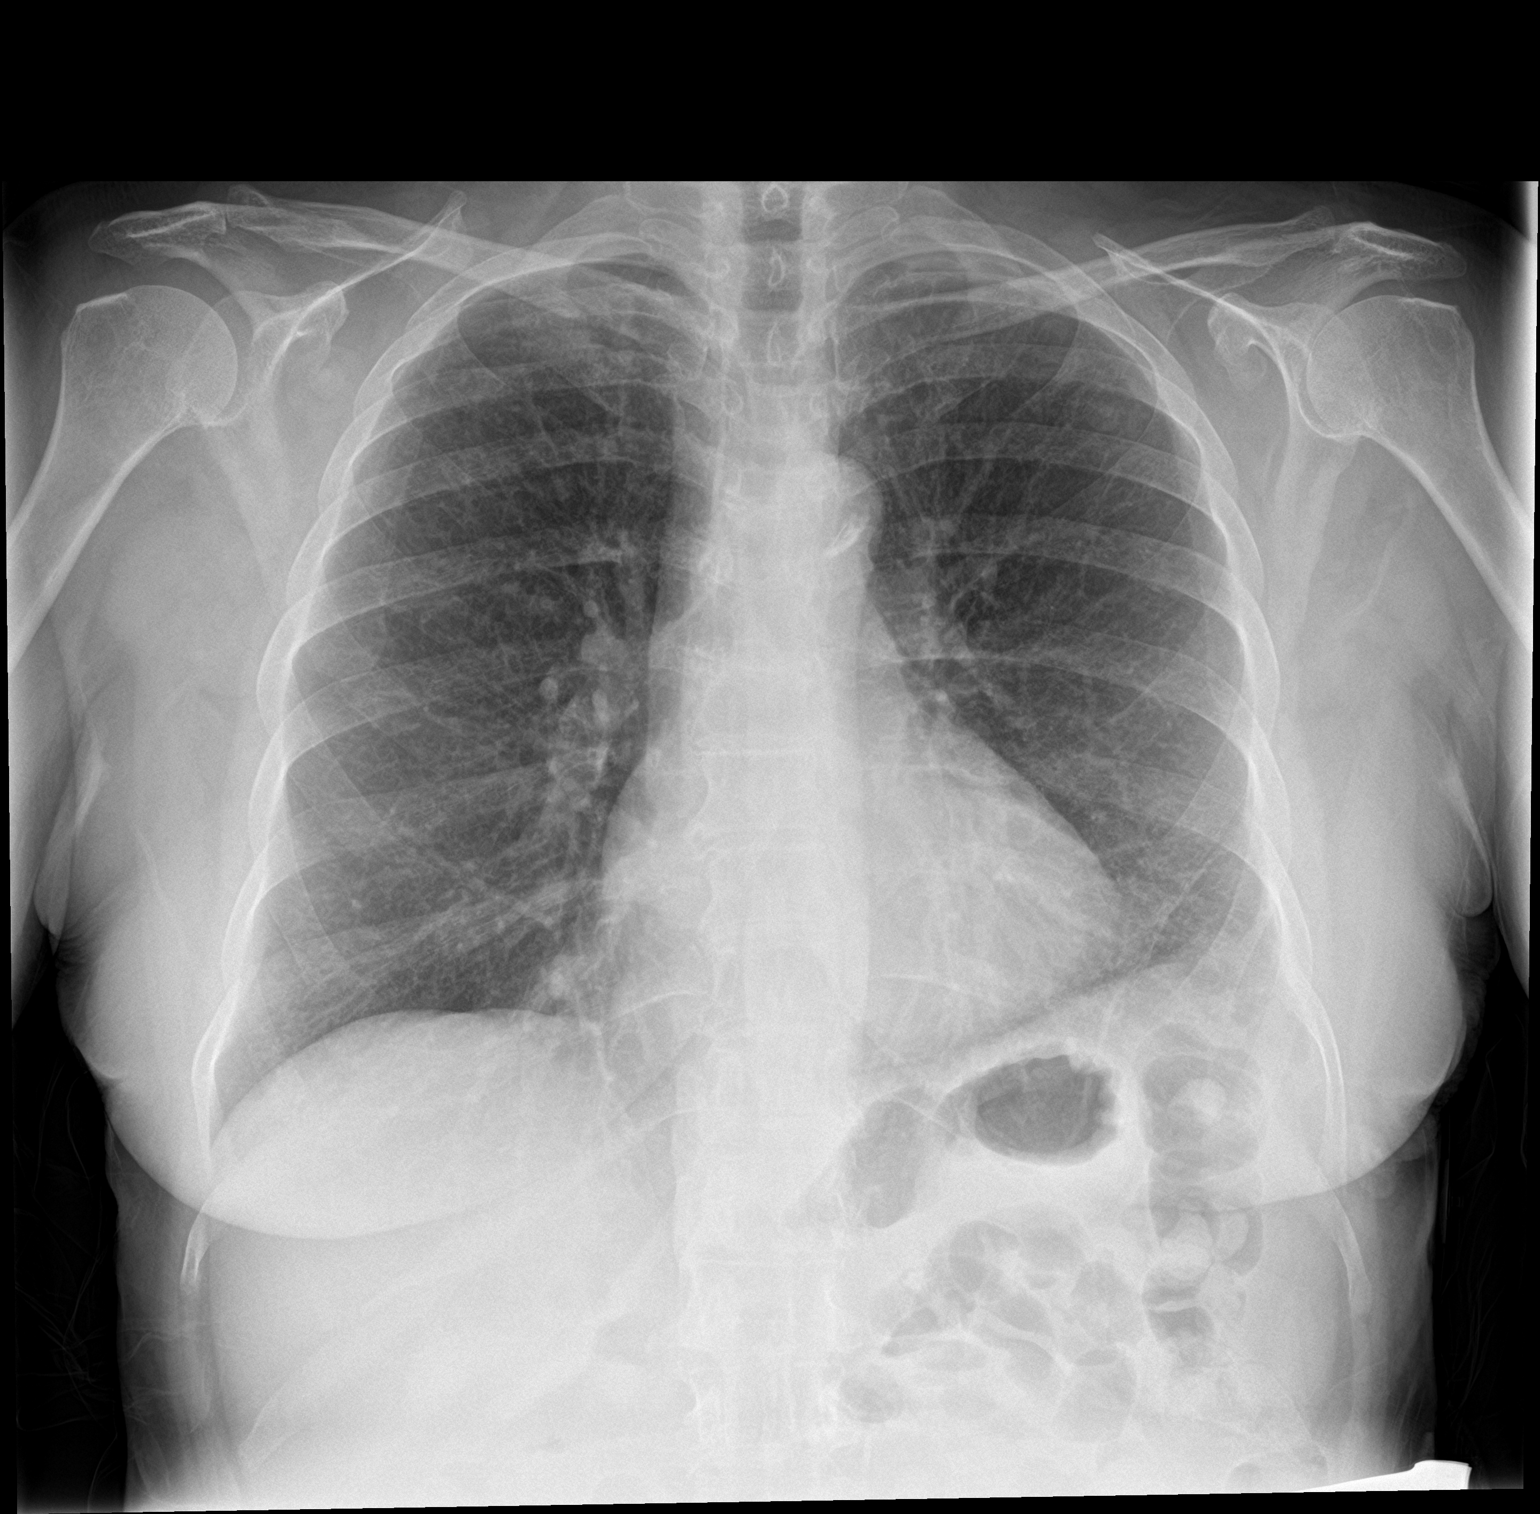

[chest lat]
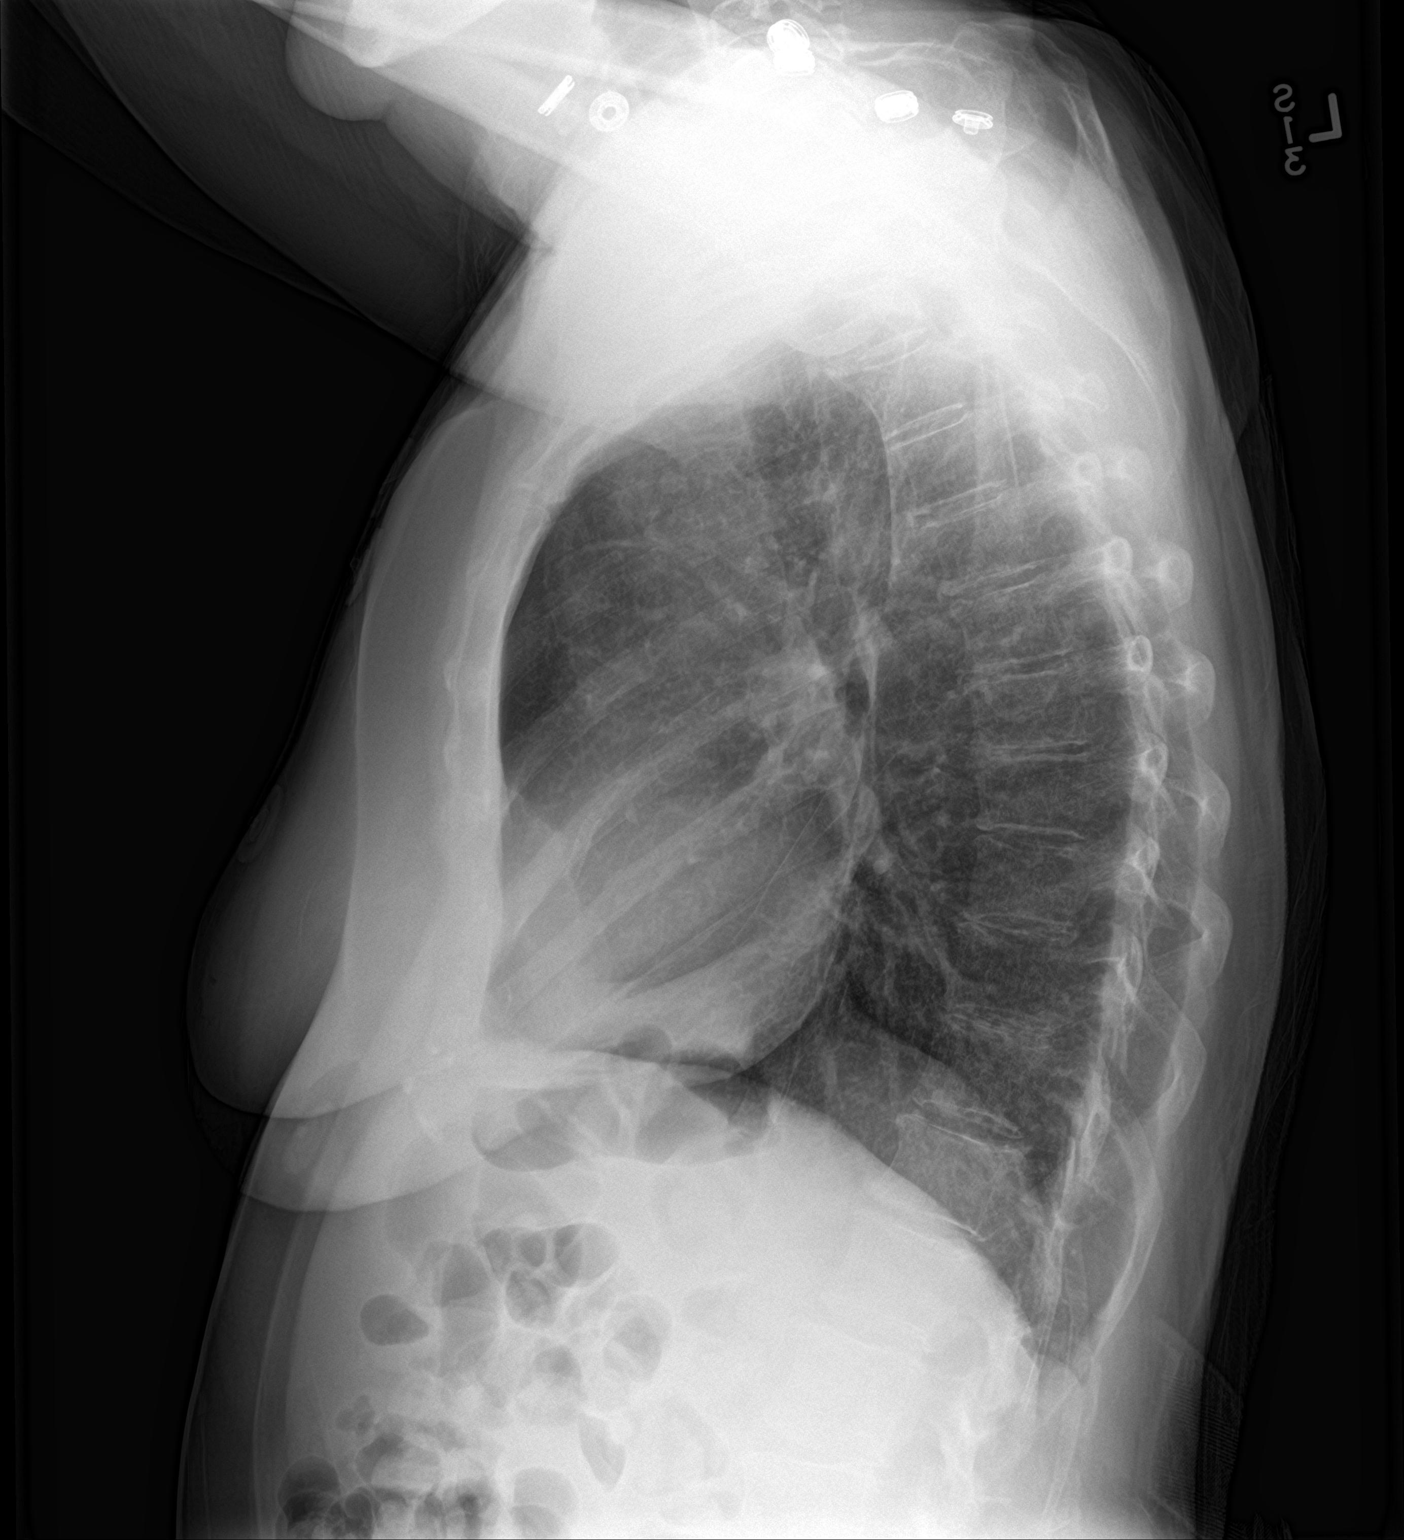

[2 of 2 positions shown; findings below may reference images not displayed]

FINDINGS: Heart size is normal. There is aortic atherosclerosis. There is
chronic pleural and parenchymal scarring at the left base laterally.
No evidence of active infiltrate, mass, effusion or collapse. No
pulmonary edema. No significant bone finding.
IMPRESSION: Chronic pleural and parenchymal scarring at the left base. No active
process.

Aortic atherosclerosis.

## 2016-06-04 MED ORDER — ASPIRIN 81 MG PO CHEW
81.0000 mg | CHEWABLE_TABLET | ORAL | Status: AC
  Administered 2016-06-05: 81 mg via ORAL
  Filled 2016-06-04: qty 1

## 2016-06-04 MED ORDER — METOPROLOL TARTRATE 25 MG PO TABS
25.0000 mg | ORAL_TABLET | Freq: Two times a day (BID) | ORAL | Status: DC
  Administered 2016-06-04 – 2016-06-06 (×4): 25 mg via ORAL
  Filled 2016-06-04 (×4): qty 1

## 2016-06-04 MED ORDER — SODIUM CHLORIDE 0.9 % IV SOLN: 250.0000 mL | INTRAVENOUS | Status: DC | PRN

## 2016-06-04 MED ORDER — ALBUTEROL SULFATE (2.5 MG/3ML) 0.083% IN NEBU: 2.5000 mg | INHALATION_SOLUTION | RESPIRATORY_TRACT | Status: DC | PRN

## 2016-06-04 MED ORDER — SODIUM CHLORIDE 0.9% FLUSH: 3.0000 mL | INTRAVENOUS | Status: DC | PRN

## 2016-06-04 MED ORDER — SODIUM CHLORIDE 0.9 % WEIGHT BASED INFUSION
1.0000 mL/kg/h | INTRAVENOUS | Status: DC
  Administered 2016-06-05 (×2): 1 mL/kg/h via INTRAVENOUS

## 2016-06-04 MED ORDER — ONDANSETRON HCL 4 MG/2ML IJ SOLN: 4.0000 mg | Freq: Four times a day (QID) | INTRAMUSCULAR | Status: DC | PRN

## 2016-06-04 MED ORDER — ASPIRIN EC 81 MG PO TBEC
81.0000 mg | DELAYED_RELEASE_TABLET | Freq: Every day | ORAL | Status: DC
  Administered 2016-06-06: 81 mg via ORAL
  Filled 2016-06-04: qty 1

## 2016-06-04 MED ORDER — HEPARIN (PORCINE) IN NACL 100-0.45 UNIT/ML-% IJ SOLN
800.0000 [IU]/h | INTRAMUSCULAR | Status: DC
  Administered 2016-06-04: 700 [IU]/h via INTRAVENOUS
  Filled 2016-06-04 (×2): qty 250

## 2016-06-04 MED ORDER — ALBUTEROL SULFATE HFA 108 (90 BASE) MCG/ACT IN AERS: 2.0000 | INHALATION_SPRAY | RESPIRATORY_TRACT | Status: DC | PRN

## 2016-06-04 MED ORDER — ACETAMINOPHEN 325 MG PO TABS: 650.0000 mg | ORAL_TABLET | ORAL | Status: DC | PRN

## 2016-06-04 MED ORDER — ATORVASTATIN CALCIUM 40 MG PO TABS
40.0000 mg | ORAL_TABLET | Freq: Every day | ORAL | Status: DC
  Administered 2016-06-04 – 2016-06-05 (×2): 40 mg via ORAL
  Filled 2016-06-04 (×2): qty 1

## 2016-06-04 MED ORDER — HEPARIN BOLUS VIA INFUSION
2500.0000 [IU] | Freq: Once | INTRAVENOUS | Status: AC
  Administered 2016-06-04: 2500 [IU] via INTRAVENOUS
  Filled 2016-06-04: qty 2500

## 2016-06-04 MED ORDER — SODIUM CHLORIDE 0.9 % WEIGHT BASED INFUSION
3.0000 mL/kg/h | INTRAVENOUS | Status: DC
  Administered 2016-06-05: 3 mL/kg/h via INTRAVENOUS

## 2016-06-04 MED ORDER — NITROGLYCERIN 0.4 MG SL SUBL: 0.4000 mg | SUBLINGUAL_TABLET | SUBLINGUAL | Status: DC | PRN

## 2016-06-04 MED ORDER — NITROGLYCERIN 0.2 MG/HR TD PT24
0.2000 mg | MEDICATED_PATCH | Freq: Every day | TRANSDERMAL | Status: DC
  Administered 2016-06-04 – 2016-06-05 (×2): 0.2 mg via TRANSDERMAL
  Filled 2016-06-04 (×3): qty 1

## 2016-06-04 MED ORDER — SODIUM CHLORIDE 0.9% FLUSH
3.0000 mL | Freq: Two times a day (BID) | INTRAVENOUS | Status: DC
  Administered 2016-06-04: 3 mL via INTRAVENOUS

## 2016-06-04 MED ORDER — CLOPIDOGREL BISULFATE 75 MG PO TABS
75.0000 mg | ORAL_TABLET | Freq: Every day | ORAL | Status: DC
  Administered 2016-06-04 – 2016-06-06 (×3): 75 mg via ORAL
  Filled 2016-06-04 (×3): qty 1

## 2016-06-04 NOTE — H&P (Signed)
CARDIOLOGY H&P   HPI:     71 y.o.femalewith history of HTN, HL, COPD with ongoing tobacco use, CAD status post inferior MI treated with bare-metal stent to the RCA in 2010. status post DES to the proximal to mid RCA 06/2010 for instent restenosis of the bare-metal stent.She had moderate left main/LAD disease which was not significant.  She was admitted in 9/17 for [place]. Cath with ISR of RCA underwent balloon angioplasty of RCA with ISR 75%->0%. Plavix restarted.   Has been doing fine until Tuesday. Since that time has increasing episodes of CP radiating to left arm. Has about 4-5 episodes per day. Feels like previous angina. Relieved quickly with NTG.   CP more frequent today so came to ER. CP relieved with NTG. Now pain free. ECG with mild ST depression in inferior lead. 1st troponin normal.   Still smoking < 1ppd. Taking Plavix.    Cath 9/17:    Ost LAD to Prox LAD lesion, 40 %stenosed. Similar to prior cath.  The left ventricular systolic function is normal.  LV end diastolic pressure is normal.  The left ventricular ejection fraction is 55-65% by visual estimate.  There is no aortic valve stenosis.  Prox RCA lesion, 75 %stenosed, in-stent restenosis.  Post intervention with 3.0 x 10 Angiosculpt inflated to 3.5 mm, there is a 0% residual stenosis.     Review of Systems:     Cardiac Review of Systems: {Y] = yes [ ]  = no  Chest Pain [ y   ]  Resting SOB [   ] Exertional SOB  [ y ]  Orthopnea [  ]   Pedal Edema [   ]    Palpitations [  ] Syncope  [  ]   Presyncope [   ]  General Review of Systems: [Y] = yes [  ]=no Constitional: recent weight change [  ]; anorexia [  ]; fatigue [ y ]; nausea [  ]; night sweats [  ]; fever [  ]; or chills [  ];                                                                     Dental: poor dentition[  ];   Eye : blurred vision [  ]; diplopia [   ]; vision changes [  ];  Amaurosis fugax[  ]; Resp: cough [  ];  wheezing[  ];   hemoptysis[  ]; shortness of breath[  ]; paroxysmal nocturnal dyspnea[  ]; dyspnea on exertion[  ]; or orthopnea[  ];  GI:  gallstones[  ], vomiting[  ];  dysphagia[  ]; melena[  ];  hematochezia [  ]; heartburn[  ];   GU: kidney stones [  ]; hematuria[  ];   dysuria [  ];  nocturia[  ];               Skin: rash [  ], swelling[  ];, hair loss[  ];  peripheral edema[  ];  or itching[  ]; Musculosketetal: myalgias[  ];  joint swelling[  ];  joint erythema[  ];  joint pain[ y ];  back pain[ y ];  Heme/Lymph: bruising[  ];  bleeding[  ];  anemia[  ];  Neuro: TIA[  ];  headaches[  ];  stroke[  ];  vertigo[  ];  seizures[  ];   paresthesias[  ];  difficulty walking[  ];  Psych:depression[  ]; anxiety[  ];  Endocrine: diabetes[  ];  thyroid dysfunction[  ];  Other:  Past Medical History:  Diagnosis Date  . Arthritis    "qwhere" (02/10/2016)  . CAD (coronary artery disease)    a. VF arrest 01/2009/CAD with inferoposterior MI s/p aspiration thrombectomy/BMS of RCA at that time. b. ISR of BMS s/p DES to RCA 06/2010 with moderate LM/LAD disease not significant by FFR. c. s/p angiosculpt PTCA to prox RCA 01/2016 for ISR.  Marland Kitchen Cardiac arrest ([place]) 01/2009   a. in setting of inf-post STEMI 01/2009 (VF).  . Chronic bronchitis ([place])   . Chronic combined systolic and diastolic CHF (congestive heart failure) ([place])    a. EF 40-45% by cath 2010. b. 60-65% with grade 1 DD by echo 09/2015  . COPD (chronic obstructive pulmonary disease) ([place])   . Hyperlipidemia 01/28/2016  . Hypertension   . MI (myocardial infarction) 01/2009  . Narcotic abuse    pt now taking Suboxone tid  . Pneumonia 06/2014; 09/2015  . Pre-diabetes   . Pulmonary nodule   . PVD (peripheral vascular disease) (H[facility])    mild atherosclerosis of infrarenal aorta, 25% ostial left renal artery stenosis, 50% ostial right common iliac by cath 2010  . S/P PTCA (percutaneous transluminal coronary angioplasty) 02/10/16 to RCA lesion for in stent restenois  02/11/2016  . Tobacco abuse     Prior to Admission medications   Medication Sig Start Date End Date Taking? Authorizing Provider  acetaminophen (TYLENOL) 500 MG tablet Take 1,000 mg by mouth every 6 (six) hours as needed for headache.   Yes Historical Provider, MD  albuterol (PROVENTIL HFA;VENTOLIN HFA) 108 (90 Base) MCG/ACT inhaler Inhale 2 puffs into the lungs every 4 (four) hours as needed for wheezing or shortness of breath.   Yes Historical Provider, MD  aspirin EC 81 MG tablet Take 81 mg by mouth at bedtime.    Yes Historical Provider, MD  atorvastatin (LIPITOR) 40 MG tablet Take 1 tablet (40 mg total) by mouth daily at 6 PM. 02/11/16  Yes Isaiah Serge, NP  Buprenorphine HCl-Naloxone HCl 8-2 MG FILM Place 1 Film under the tongue 3 (three) times daily.   Yes Historical Provider, MD  clopidogrel (PLAVIX) 75 MG tablet Take 1 tablet (75 mg total) by mouth daily. Patient taking differently: Take 75 mg by mouth at bedtime.  01/31/16  Yes Imogene Burn, PA-C  metoprolol tartrate (LOPRESSOR) 25 MG tablet Take 1 tablet (25 mg total) by mouth 2 (two) times daily. 01/31/16  Yes Imogene Burn, PA-C  nitroGLYCERIN (NITROSTAT) 0.4 MG SL tablet Place 1 tablet (0.4 mg total) under the tongue every 5 (five) minutes as needed for chest pain. 02/11/16  Yes Isaiah Serge, NP      Allergies  Allergen Reactions  . Azithromycin Rash    Social History   Social History  . Marital status: Legally Separated    Spouse name: N/A  . Number of children: N/A  . Years of education: N/A   Occupational History  . Not on file.   Social History Main Topics  . Smoking status: Current Every Day Smoker    Packs/day: 0.50    Years: 56.00    Types: Cigarettes  . Smokeless tobacco: Never Used  . Alcohol use No  .  Drug use: No  . Sexual activity: Not Currently   Other Topics Concern  . Not on file   Social History Narrative  . No narrative on file    Family History  Problem Relation Age of Onset  .  Coronary artery disease Father   . Coronary artery disease Mother   . Cancer Brother     PHYSICAL EXAM: Vitals:   06/04/16 1602  BP: (!) 110/47  Resp: 16  Temp: 98.6 F (37 C)   General:  Well appearing. No respiratory difficulty HEENT: normal Neck: supple. no JVD. Carotids 2+ bilat; + R bruit. No lymphadenopathy or thryomegaly appreciated. Cor: PMI nondisplaced. Regular rate & rhythm. No rubs, gallops or murmurs. Lungs: clear but markedly diminished breath sounds throughout Abdomen: soft, nontender, nondistended. No hepatosplenomegaly. No bruits or masses. Good bowel sounds. Extremities: no cyanosis, clubbing, rash, edema Neuro: alert & oriented x 3, cranial nerves grossly intact. moves all 4 extremities w/o difficulty. Affect pleasant.  ECG: NSR 87 mild ST depression in inferior leads.   Results for orders placed or performed during the hospital encounter of 06/04/16 (from the past 24 hour(s))  Basic metabolic panel     Status: None   Collection Time: 06/04/16  1:16 PM  Result Value Ref Range   Sodium 140 135 - 145 mmol/L   Potassium 4.3 3.5 - 5.1 mmol/L   Chloride 107 101 - 111 mmol/L   CO2 25 22 - 32 mmol/L   Glucose, Bld 98 65 - 99 mg/dL   BUN 9 6 - 20 mg/dL   Creatinine, Ser 0.68 0.44 - 1.00 mg/dL   Calcium 9.3 8.9 - 10.3 mg/dL   GFR calc non Af Amer >60 >60 mL/min   GFR calc Af Amer >60 >60 mL/min   Anion gap 8 5 - 15  CBC     Status: None   Collection Time: 06/04/16  1:16 PM  Result Value Ref Range   WBC 9.2 4.0 - 10.5 K/uL   RBC 4.12 3.87 - 5.11 MIL/uL   Hemoglobin 12.9 12.0 - 15.0 g/dL   HCT 38.8 36.0 - 46.0 %   MCV 94.2 78.0 - 100.0 fL   MCH 31.3 26.0 - 34.0 pg   MCHC 33.2 30.0 - 36.0 g/dL   RDW 13.2 11.5 - 15.5 %   Platelets 295 150 - 400 K/uL  I-stat troponin, ED     Status: None   Collection Time: 06/04/16  1:27 PM  Result Value Ref Range   Troponin i, poc 0.00 0.00 - 0.08 ng/mL   Comment 3           Dg Chest 2 View  Result Date:  06/04/2016 CLINICAL DATA:  Left arm pain over the last 4 days. No chest pain. Previous history of myocardial infarction and stents. EXAM: CHEST  2 VIEW COMPARISON:  10/27/2015 FINDINGS: Heart size is normal. There is aortic atherosclerosis. There is chronic pleural and parenchymal scarring at the left base laterally. No evidence of active infiltrate, mass, effusion or collapse. No pulmonary edema. No significant bone finding. IMPRESSION: Chronic pleural and parenchymal scarring at the left base. No active process. Aortic atherosclerosis. Electronically Signed   By: Nelson Chimes M.D.   On: 06/04/2016 15:12     ASSESSMENT: 1. Unstable angina 2. CAD s/p PCI of RCA due to in-stent restenosis 9/17 3. Ongoing tobacco use 4. HTN 5. HL 6. R carotid bruit  PLAN/DISCUSSION:  Chest pain concerning for unstable angina. Will start heparin. Continue  ASA, Plavix, statin and bblocker. Add NTG patch.  Plan cath in am. Reinforced need for smoking cessation. U/s for R carotid bruit.   Bensimhon, Daniel,MD 4:11 PM

## 2016-06-04 NOTE — ED Provider Notes (Signed)
[facility] DEPT Provider Note   CSN: [id number] Arrival date & time: 06/04/16  1302     History   Chief Complaint Chief Complaint  Patient presents with  . Chest Pain    HPI Kathryn Garrett is a 71 y.o. female.  HPI   71 year old female with history CAD status post inferior MI treated with parent mental stent, status post DES, with hospitalization admission for sepsis secondary to pneumonia in May 2017 presents to the chest pain. Patient notes since her hospitalization she has been doing relatively well. She notes that approximate 5 days ago at 1:30 AM she started having pain in her left arm described as achy with centralized chest burning. She notes she took her nitroglycerin which she has not taken before, this resolved her symptoms. She notes since then she has had recurrence of symptoms daily. She notes these are worse with exertion, but also occasionally happen with no exertion. She notes the nitroglycerin seems to improve the symptoms but only temporarily. She notes yesterday she took approximately 5 and the nitroglycerin, she needed to take 1 today. Patient reports symptoms felt similar to previous MI. Patient denies any productive cough, URI symptoms, fever or chills, shortness of breath. She denies any lower extremity swelling or edema, diaphoresis, nausea. Patient and she continues to smoke.   Patient most recently with cardiac cath on 0000000 without complication's. She was noted to have Ost LAD to Prox LAD lesion 40 %stenosed an dProx RCA lesion 75 %stenosed, in-stent restenosis.     Past Medical History:  Diagnosis Date  . Arthritis    "qwhere" (02/10/2016)  . CAD (coronary artery disease)    a. VF arrest 01/2009/CAD with inferoposterior MI s/p aspiration thrombectomy/BMS of RCA at that time. b. ISR of BMS s/p DES to RCA 06/2010 with moderate LM/LAD disease not significant by FFR. c. s/p angiosculpt PTCA to prox RCA 01/2016 for ISR.  Marland Kitchen Cardiac arrest ([place]) 01/2009   a. in setting of inf-post STEMI 01/2009 (VF).  . Chronic bronchitis ([place])   . Chronic combined systolic and diastolic CHF (congestive heart failure) ([place])    a. EF 40-45% by cath 2010. b. 60-65% with grade 1 DD by echo 09/2015  . COPD (chronic obstructive pulmonary disease) ([place])   . Hyperlipidemia 01/28/2016  . Hypertension   . MI (myocardial infarction) 01/2009  . Narcotic abuse    pt now taking Suboxone tid  . Pneumonia 06/2014; 09/2015  . Pre-diabetes   . Pulmonary nodule   . PVD (peripheral vascular disease) (H[facility])    mild atherosclerosis of infrarenal aorta, 25% ostial left renal artery stenosis, 50% ostial right common iliac by cath 2010  . S/P PTCA (percutaneous transluminal coronary angioplasty) 02/10/16 to RCA lesion for in stent restenois 02/11/2016  . Tobacco abuse     Patient Active Problem List   Diagnosis Date Noted  . Chest pain 06/04/2016  . S/P PTCA (percutaneous transluminal coronary angioplasty) 02/10/16 to RCA lesion for in stent restenois 02/11/2016  . Angina pectoris ([place])   . Hyperlipidemia LDL goal <70 01/28/2016  . Presence of coronary angioplasty implant and graft 10/15/2015  . Current tobacco use 10/15/2015  . Lung mass   . Pleural effusion   . Pleural effusion on left 09/24/2015  . Sepsis ([place]) 09/24/2015  . CAP (community acquired pneumonia) 09/24/2015  . CAD (coronary artery disease) 09/24/2015  . HTN (hypertension) 09/24/2015  . Lung nodule, solitary 03/31/2014  . Liver nodule 03/31/2014    Past Surgical History:  Procedure Laterality Date  . ABDOMINAL HYSTERECTOMY    . ANKLE FRACTURE SURGERY Right 2000s  . CARDIAC CATHETERIZATION N/A 02/10/2016   Procedure: Left Heart Cath and Coronary Angiography;  Surgeon: Jettie Booze, MD;  Location: [facility];  Service: Cardiovascular;  Laterality: N/A;  . CARDIAC CATHETERIZATION N/A 02/10/2016   Procedure: Coronary Balloon Angioplasty;  Surgeon: Jettie Booze, MD;  Location: [facility];  Service: Cardiovascular;  Laterality: N/A;  instent RCA  . CHEST TUBE INSERTION N/A 10/08/2015   Procedure: PLEURX CATH REMOVAL;  Surgeon: [name], MD;  Location: [facility];  Service: Thoracic;  Laterality: N/A;  . CORONARY ANGIOPLASTY WITH STENT PLACEMENT  01/2009; 2012  . FRACTURE SURGERY    . VIDEO ASSISTED THORACOSCOPY (VATS)/THOROCOTOMY Left 09/29/2015   Procedure: PREOP BRONCHOSCOPY, LEFT THORACOSCOPY, POSSIBLE THORACOTOMY, PLEURAL BIOPSY, TALC;  Surgeon: [name], MD;  Location: [facility] OR[facility];  Service: General;  Laterality: Left;    OB History    No data available       Home Medications    Prior to Admission medications   Medication Sig Start Date End Date Taking? Authorizing Provider  acetaminophen (TYLENOL) 500 MG tablet Take 1,000 mg by mouth every 6 (six) hours as needed for headache.   Yes Historical Provider, MD  albuterol (PROVENTIL HFA;VENTOLIN HFA) 108 (90 Base) MCG/ACT inhaler Inhale 2 puffs into the lungs every 4 (four) hours as needed for wheezing or shortness of breath.   Yes Historical Provider, MD  aspirin EC 81 MG tablet Take 81 mg by mouth at bedtime.    Yes Historical Provider, MD  atorvastatin (LIPITOR) 40 MG tablet Take 1 tablet (40 mg total) by mouth daily at 6 PM. 02/11/16  Yes Isaiah Serge, NP  Buprenorphine HCl-Naloxone HCl 8-2 MG FILM Place 1 Film under the tongue 3 (three) times daily.   Yes Historical Provider, MD  clopidogrel (PLAVIX) 75 MG tablet Take 1 tablet (75 mg total) by mouth daily. Patient taking differently: Take 75 mg by mouth at bedtime.  01/31/16  Yes Imogene Burn, PA-C  metoprolol tartrate (LOPRESSOR) 25 MG tablet Take 1 tablet (25 mg total) by mouth 2 (two) times daily. 01/31/16  Yes Imogene Burn, PA-C  nitroGLYCERIN (NITROSTAT) 0.4 MG SL tablet Place 1 tablet (0.4 mg total) under the tongue every 5 (five) minutes as needed for chest pain. 02/11/16  Yes Isaiah Serge, NP    Family History Family History  Problem Relation  Age of Onset  . Coronary artery disease Father   . Coronary artery disease Mother   . Cancer Brother     Social History Social History  Substance Use Topics  . Smoking status: Current Every Day Smoker    Packs/day: 0.50    Years: 56.00    Types: Cigarettes  . Smokeless tobacco: Never Used  . Alcohol use No     Allergies   Azithromycin   Review of Systems Review of Systems  All other systems reviewed and are negative.    Physical Exam Updated Vital Signs BP (!) 114/40   Pulse 78 Comment: Simultaneous filing. User may not have seen previous data.  Temp 98.6 F (37 C) (Oral)   Resp 12 Comment: Simultaneous filing. User may not have seen previous data.  SpO2 96% Comment: Simultaneous filing. User may not have seen previous data.  Physical Exam  Constitutional: She is oriented to person, place, and time. She appears well-developed and well-nourished.  HENT:  Head:  Normocephalic and atraumatic.  Eyes: Conjunctivae are normal. Pupils are equal, round, and reactive to light. Right eye exhibits no discharge. Left eye exhibits no discharge. No scleral icterus.  Neck: Normal range of motion. No JVD present. No tracheal deviation present.  Cardiovascular: Normal rate, regular rhythm, normal heart sounds and intact distal pulses.  Exam reveals no gallop and no friction rub.   No murmur heard. Pulmonary/Chest: Effort normal and breath sounds normal. No stridor. No respiratory distress.  Faint crackles left lower lobe   Abdominal: Soft. She exhibits no distension. There is no tenderness.  Musculoskeletal: Normal range of motion. She exhibits no edema.  Neurological: She is alert and oriented to person, place, and time. Coordination normal.  Skin: Skin is warm.  Psychiatric: She has a normal mood and affect. Her behavior is normal. Judgment and thought content normal.  Nursing note and vitals reviewed.   ED Treatments / Results  Labs (all labs ordered are listed, but only  abnormal results are displayed) Labs Reviewed  [place], ED    EKG  EKG Interpretation  Date/Time:  Sunday June 04 2016 13:07:15 EST Ventricular Rate:  87 PR Interval:  120 QRS Duration: 80 QT Interval:  348 QTC Calculation: 418 R Axis:   74 Text Interpretation:  Normal sinus rhythm more pronounced  ST depression in Inferior leads Confirmed by Maryan Rued  MD, Loree Fee ([id number]) on 06/04/2016 1:47:58 PM       Radiology Dg Chest 2 View  Result Date: 06/04/2016 CLINICAL DATA:  Left arm pain over the last 4 days. No chest pain. Previous history of myocardial infarction and stents. EXAM: CHEST  2 VIEW COMPARISON:  10/27/2015 FINDINGS: Heart size is normal. There is aortic atherosclerosis. There is chronic pleural and parenchymal scarring at the left base laterally. No evidence of active infiltrate, mass, effusion or collapse. No pulmonary edema. No significant bone finding. IMPRESSION: Chronic pleural and parenchymal scarring at the left base. No active process. Aortic atherosclerosis. Electronically Signed   By: Nelson Chimes M.D.   On: 06/04/2016 15:12    Procedures Procedures (including critical care time)  Medications Ordered in ED Medications  nitroGLYCERIN (NITRODUR - Dosed in mg/24 hr) patch 0.2 mg (0.2 mg Transdermal Patch Applied 06/04/16 1706)     Initial Impression / Assessment and Plan / ED Course  I have reviewed the triage vital signs and the nursing notes.  Pertinent labs & imaging results that were available during my care of the patient were reviewed by me and considered in my medical decision making (see chart for details).      Final Clinical Impressions(s) / ED Diagnoses   Final diagnoses:  Chest pain, unspecified type    Labs:Troponin, BMP, CBC  Imaging: DG chest 2 view  Consults:  Therapeutics:  Discharge Meds:   Assessment/Plan: 71 year old female presents today with complaints of chest pain. Patient has a  significant past medical history of MI and cardiac arrest. Most recently she had cardiac cath with intervention in September due to significant stenosis. Patient having intermittent chest pain worse with exertion concerning for unstable angina. Patient well-appearing here with reassuring vital signs, no elevation in troponin. Very minor changes in her EKG. Cardiology was consult at 2 personally evaluated the patient here in the ED and will be admitting the patient for further management.      New Prescriptions New Prescriptions   No medications on file     Okey Regal, PA-C 06/04/16 1711  Blanchie Dessert, MD 06/04/16 2128

## 2016-06-04 NOTE — ED Notes (Signed)
Patient given Coffee Decaf. Diet tray ordered

## 2016-06-04 NOTE — Progress Notes (Signed)
New pt admission from ED. Pt brought to the floor in stable condition. Vitals taken. Initial Assessment done. All immediate pertinent needs to patient addressed. Important safety instructions relating to hospitalization reviewed with patient. Patient verbalized understanding. Pt having cardiac cath tomorrow, NPO from midnight, OR procedures due, report will be provided to the next shift since pt arrived to the unit at end of the shift. Will continue to monitor pt.

## 2016-06-04 NOTE — Progress Notes (Deleted)
Cardiology Office Note   Date:  06/04/2016   ID:  Kathryn Garrett, DOB [birth year], MRN [id number]  PCP:  Imagene Riches, NP    No chief complaint on file.    Wt Readings from Last 3 Encounters:  03/13/16 132 lb (59.9 kg)  02/11/16 136 lb 11 oz (62 kg)  01/31/16 132 lb 6.4 oz (60.1 kg)       History of Present Illness: Kathryn Garrett is a 71 y.o. female   with history of VF arrest 01/2009/CAD with inferoposterior MI s/p aspiration thrombectomy/BMS of RCA at that time, ISR of BMS s/p DES to RCA 06/2010, PTCA to ISR of prox RCA 01/2016, chronic combined CHF (EF 40-45% by cath 2010, improved to 60-65% in 09/2015), COPD, tobacco abuse, PVD (mild atherosclerosis of infrarenal aorta, 25% ostial left renal artery stenosis, 50% ostial right common iliac by cath 2010), GERD, prior narcotic abuse, pulm nodule who presents for f/u. She was admitted 09/2015 with sepsis felt due to CAP/parapneumonic pleural effusion/empyema with cytology negative for malignancy, s/p PleurX catheter. CT Chest 01/13/16 without residual effusion, + post infectious scaring LLL, solid 61mm RUL pulm nodule c/w benign nodule, ground glass pulm nodules in both lungs favored to be related to interstitial lung disease, stable nonspecific mild mediastinal and left hilar adenopathy, COPD, aortic atherosclerosis/coronary atherosclerosis.   In 9/17, she reported exertional left arm pain, relieved with NTG. She had run out of Plavix and metoprolol for 1 month. LHC 02/10/16: 40% ostial-prox LAD similar to prior cath, 75% prox RCA (ISR) s/p angiosculpt, LVEF 55-65%, normal LVEDP. Plavix was restarted and atorvastatin was added. Prior echo 09/2015: EF 60-65%, grade 1 DD. Lipids 10/2015: LDL 123, trig 214, HDL 38, A1C 6.0, LFTs wnl.  She returns for follow-up overall feeling great. No recurrent left arm pain or chest pain. No shortness of breath. No syncope. Reports compliance with meds. No bleeding problems.   At her last visit post PTCA,  she did report an area of focal numbness on her lateral right thigh just proximal to her kneecap. Also gets occasional pain down the side of that kneecap. She has not had any calf or hip/buttock pain with ambulation.    Past Medical History:  Diagnosis Date  . Arthritis    "qwhere" (02/10/2016)  . CAD (coronary artery disease)    a. VF arrest 01/2009/CAD with inferoposterior MI s/p aspiration thrombectomy/BMS of RCA at that time. b. ISR of BMS s/p DES to RCA 06/2010 with moderate LM/LAD disease not significant by FFR. c. s/p angiosculpt PTCA to prox RCA 01/2016 for ISR.  Marland Kitchen Cardiac arrest ([place]) 01/2009   a. in setting of inf-post STEMI 01/2009 (VF).  . Chronic bronchitis ([place])   . Chronic combined systolic and diastolic CHF (congestive heart failure) ([place])    a. EF 40-45% by cath 2010. b. 60-65% with grade 1 DD by echo 09/2015  . COPD (chronic obstructive pulmonary disease) ([place])   . Hyperlipidemia 01/28/2016  . Hypertension   . MI (myocardial infarction) 01/2009  . Narcotic abuse    pt now taking Suboxone tid  . Pneumonia 06/2014; 09/2015  . Pre-diabetes   . Pulmonary nodule   . PVD (peripheral vascular disease) ([facility])    mild atherosclerosis of infrarenal aorta, 25% ostial left renal artery stenosis, 50% ostial right common iliac by cath 2010  . S/P PTCA (percutaneous transluminal coronary angioplasty) 02/10/16 to RCA lesion for in stent restenois 02/11/2016  . Tobacco abuse  Past Surgical History:  Procedure Laterality Date  . ABDOMINAL HYSTERECTOMY    . ANKLE FRACTURE SURGERY Right 2000s  . CARDIAC CATHETERIZATION N/A 02/10/2016   Procedure: Left Heart Cath and Coronary Angiography;  Surgeon: Jettie Booze, MD;  Location: [facility];  Service: Cardiovascular;  Laterality: N/A;  . CARDIAC CATHETERIZATION N/A 02/10/2016   Procedure: Coronary Balloon Angioplasty;  Surgeon: Jettie Booze, MD;  Location: [facility];  Service: Cardiovascular;  Laterality: N/A;   instent RCA  . CHEST TUBE INSERTION N/A 10/08/2015   Procedure: PLEURX CATH REMOVAL;  Surgeon: [name], MD;  Location: [facility];  Service: Thoracic;  Laterality: N/A;  . CORONARY ANGIOPLASTY WITH STENT PLACEMENT  01/2009; 2012  . FRACTURE SURGERY    . VIDEO ASSISTED THORACOSCOPY (VATS)/THOROCOTOMY Left 09/29/2015   Procedure: PREOP BRONCHOSCOPY, LEFT THORACOSCOPY, POSSIBLE THORACOTOMY, PLEURAL BIOPSY, TALC;  Surgeon: [name], MD;  Location: [facility];  Service: General;  Laterality: Left;     No current facility-administered medications for this visit.    Current Outpatient Prescriptions  Medication Sig Dispense Refill  . acetaminophen (TYLENOL) 500 MG tablet Take 1,000 mg by mouth every 6 (six) hours as needed for headache.    . albuterol (PROVENTIL HFA;VENTOLIN HFA) 108 (90 Base) MCG/ACT inhaler Inhale 2 puffs into the lungs every 4 (four) hours as needed for wheezing or shortness of breath.    Marland Kitchen aspirin EC 81 MG tablet Take 81 mg by mouth at bedtime.     Marland Kitchen atorvastatin (LIPITOR) 40 MG tablet Take 1 tablet (40 mg total) by mouth daily at 6 PM. 30 tablet 6  . Buprenorphine HCl-Naloxone HCl 8-2 MG FILM Place 1 Film under the tongue 3 (three) times daily.    . clopidogrel (PLAVIX) 75 MG tablet Take 1 tablet (75 mg total) by mouth daily. (Patient taking differently: Take 75 mg by mouth at bedtime. ) 90 tablet 3  . metoprolol tartrate (LOPRESSOR) 25 MG tablet Take 1 tablet (25 mg total) by mouth 2 (two) times daily. 180 tablet 3  . nitroGLYCERIN (NITROSTAT) 0.4 MG SL tablet Place 1 tablet (0.4 mg total) under the tongue every 5 (five) minutes as needed for chest pain. 25 tablet 4   Facility-Administered Medications Ordered in Other Visits  Medication Dose Route Frequency Provider Last Rate Last Dose  . nitroGLYCERIN (NITRODUR - Dosed in mg/24 hr) patch 0.2 mg  0.2 mg Transdermal Daily Jolaine Artist, MD   0.2 mg at 06/04/16 1706    Allergies:   Azithromycin    Social History:   The patient  reports that she has been smoking Cigarettes.  She has a 28.00 pack-year smoking history. She has never used smokeless tobacco. She reports that she does not drink alcohol or use drugs.   Family History:  The patient's ***family history includes Cancer in her brother; Coronary artery disease in her father and mother.    ROS:  Please see the history of present illness.   Otherwise, review of systems are positive for ***.   All other systems are reviewed and negative.    PHYSICAL EXAM: VS:  There were no vitals taken for this visit. , BMI There is no height or weight on file to calculate BMI. GEN: Well nourished, well developed, in no acute distress  HEENT: normal  Neck: no JVD, carotid bruits, or masses Cardiac: ***RRR; no murmurs, rubs, or gallops,no edema  Respiratory:  clear to auscultation bilaterally, normal work of breathing GI: soft, nontender,  nondistended, + BS MS: no deformity or atrophy  Skin: warm and dry, no rash Neuro:  Strength and sensation are intact Psych: euthymic mood, full affect   EKG:   The ekg ordered today demonstrates ***   Recent Labs: 09/24/2015: B Natriuretic Peptide 30.0 09/28/2015: ALT 11 06/04/2016: BUN 9; Creatinine, Ser 0.68; Hemoglobin 12.9; Platelets 295; Potassium 4.3; Sodium 140   Lipid Panel    Component Value Date/Time   CHOL 85 03/20/2013 0557   TRIG 112 03/20/2013 0557   HDL 21 (L) 03/20/2013 0557   CHOLHDL 5.8 01/14/2009 0319   VLDL 22 03/20/2013 0557   LDLCALC 42 03/20/2013 0557     Other studies Reviewed: Additional studies/ records that were reviewed today with results demonstrating: ***.   ASSESSMENT AND PLAN:  1. CAD  2. Hyperlipidemia: Needs a f/u lipid panel.  LDL target 70. 3. Tobacco abuse: She needs to stop smoking. 4. HTN:   Current medicines are reviewed at length with the patient today.  The patient concerns regarding her medicines were addressed.  The following changes have been made:  No  change***  Labs/ tests ordered today include: *** No orders of the defined types were placed in this encounter.   Recommend 150 minutes/week of aerobic exercise Low fat, low carb, high fiber diet recommended  Disposition:   FU in ***   Signed, Larae Grooms, MD  06/04/2016 5:39 PM    [facility] [place], [place], [place]  [address] Phone: [phone]; Fax: [phone]

## 2016-06-04 NOTE — Progress Notes (Signed)
ANTICOAGULATION CONSULT NOTE - Initial Consult  Pharmacy Consult:  Heparin Indication: chest pain/ACS  Allergies  Allergen Reactions  . Azithromycin Rash    Patient Measurements: Weight: 132 lb 0.9 oz (59.9 kg) Heparin Dosing Weight: 56 kg  Vital Signs: Temp: 98.6 F (37 C) (01/21 1602) Temp Source: Oral (01/21 1602) BP: 120/50 (01/21 1730) Pulse Rate: 103 (01/21 1730)  Labs:  Recent Labs  06/04/16 1316  HGB 12.9  HCT 38.8  PLT 295  CREATININE 0.68    Estimated Creatinine Clearance: 50.8 mL/min (by C-G formula based on SCr of 0.68 mg/dL).   Medical History: Past Medical History:  Diagnosis Date  . Arthritis    "qwhere" (02/10/2016)  . CAD (coronary artery disease)    a. VF arrest 01/2009/CAD with inferoposterior MI s/p aspiration thrombectomy/BMS of RCA at that time. b. ISR of BMS s/p DES to RCA 06/2010 with moderate LM/LAD disease not significant by FFR. c. s/p angiosculpt PTCA to prox RCA 01/2016 for ISR.  Marland Kitchen Cardiac arrest ([place]) 01/2009   a. in setting of inf-post STEMI 01/2009 (VF).  . Chronic bronchitis ([place])   . Chronic combined systolic and diastolic CHF (congestive heart failure) ([place])    a. EF 40-45% by cath 2010. b. 60-65% with grade 1 DD by echo 09/2015  . COPD (chronic obstructive pulmonary disease) ([place])   . Hyperlipidemia 01/28/2016  . Hypertension   . MI (myocardial infarction) 01/2009  . Narcotic abuse    pt now taking Suboxone tid  . Pneumonia 06/2014; 09/2015  . Pre-diabetes   . Pulmonary nodule   . PVD (peripheral vascular disease) (H[facility])    mild atherosclerosis of infrarenal aorta, 25% ostial left renal artery stenosis, 50% ostial right common iliac by cath 2010  . S/P PTCA (percutaneous transluminal coronary angioplasty) 02/10/16 to RCA lesion for in stent restenois 02/11/2016  . Tobacco abuse        Assessment: 24 YOF with history of HTN, HLD, HF and CAD post stenting.  In September of 2017, patient underwent a cath and clean out the previous  BMS.  Plavix was restarted at that time.  She presented today with chest pain and Pharmacy consulted to initiate IV heparin for ACS.  Baseline labs and home meds reviewed.    Goal of Therapy:  Heparin level 0.3-0.7 units/ml Monitor platelets by anticoagulation protocol: Yes    Plan:  - Heparin 2500 units IV bolus x 1, then - Heparin gtt at 700 units/hr - Check 8 hr heparin level - Daily heparin level and CBC   Reinhardt Licausi D. Mina Marble, PharmD, BCPS Pager:  [phone] 06/04/2016, 6:04 PM

## 2016-06-04 NOTE — Progress Notes (Signed)
When doing assessment asked patient about home medications. Pt told me that she has some medication from home with her ( in her bag). Informed patient about policy and procedure and advised patient that we need to count medication and send them to pharmacy. Informed her that medications will be returned prior discharge. Patient refused to send medication to pharmacy, told me that she will not be taking anything except what's given here and told me that she will send them home as soon as possible. Will continue to monitor.  Calvin Chura, RN

## 2016-06-04 NOTE — ED Triage Notes (Signed)
Has had cp since Tuesday has been taking nitro  And it has been helping but she did n ot come in  No n/v/sob ,states it is burning

## 2016-06-05 ENCOUNTER — Encounter (HOSPITAL_COMMUNITY)
Admission: EM | Disposition: A | Discharge status: Home / Self Care | Payer: Self-pay | Source: Home / Self Care | Attending: Internal Medicine

## 2016-06-05 ENCOUNTER — Inpatient Hospital Stay (HOSPITAL_COMMUNITY): Payer: Medicare HMO

## 2016-06-05 ENCOUNTER — Ambulatory Visit: Payer: Medicare HMO | Admitting: Interventional Cardiology

## 2016-06-05 DIAGNOSIS — R0989 Other specified symptoms and signs involving the circulatory and respiratory systems: Secondary | ICD-10-CM

## 2016-06-05 DIAGNOSIS — I25110 Atherosclerotic heart disease of native coronary artery with unstable angina pectoris: Secondary | ICD-10-CM

## 2016-06-05 HISTORY — PX: CARDIAC CATHETERIZATION: SHX172

## 2016-06-05 LAB — TROPONIN I
TROPONIN I: 0.04 ng/mL — AB (ref <0.03)
Troponin I: 0.06 ng/mL (ref <0.03)

## 2016-06-05 LAB — VAS US CAROTID
LCCADDIAS: -21 cm/s
LEFT ECA DIAS: -20 cm/s
LEFT VERTEBRAL DIAS: 18 cm/s
LICAPDIAS: -22 cm/s
Left CCA dist sys: -89 cm/s
Left CCA prox dias: 20 cm/s
Left CCA prox sys: 112 cm/s
Left ICA dist dias: -34 cm/s
Left ICA dist sys: -140 cm/s
Left ICA prox sys: -112 cm/s
RCCADSYS: -87 cm/s
RCCAPDIAS: 24 cm/s
RIGHT ECA DIAS: -16 cm/s
RIGHT VERTEBRAL DIAS: 9 cm/s
Right CCA prox sys: 106 cm/s

## 2016-06-05 LAB — BASIC METABOLIC PANEL
Anion gap: 11 (ref 5–15)
BUN: 8 mg/dL (ref 6–20)
CHLORIDE: 103 mmol/L (ref 101–111)
CO2: 27 mmol/L (ref 22–32)
Calcium: 9.2 mg/dL (ref 8.9–10.3)
Creatinine, Ser: 0.86 mg/dL (ref 0.44–1.00)
GFR calc Af Amer: >60 mL/min (ref >60)
GLUCOSE: 118 mg/dL — AB (ref 65–99)
POTASSIUM: 4.3 mmol/L (ref 3.5–5.1)
SODIUM: 141 mmol/L (ref 135–145)

## 2016-06-05 LAB — LIPID PANEL
CHOL/HDL RATIO: 3.5 ratio
Cholesterol: 113 mg/dL (ref 0–200)
HDL: 32 mg/dL — AB (ref >40)
LDL Cholesterol: 57 mg/dL (ref 0–99)
Triglycerides: 120 mg/dL (ref <150)
VLDL: 24 mg/dL (ref 0–40)

## 2016-06-05 LAB — POCT ACTIVATED CLOTTING TIME
ACTIVATED CLOTTING TIME: 356 s
ACTIVATED CLOTTING TIME: 373 s

## 2016-06-05 LAB — PROTIME-INR
INR: 1.29
PROTHROMBIN TIME: 16.2 s — AB (ref 11.4–15.2)

## 2016-06-05 LAB — CBC
HEMATOCRIT: 35.5 % — AB (ref 36.0–46.0)
HEMOGLOBIN: 11.4 g/dL — AB (ref 12.0–15.0)
MCH: 30.7 pg (ref 26.0–34.0)
MCHC: 32.1 g/dL (ref 30.0–36.0)
MCV: 95.7 fL (ref 78.0–100.0)
PLATELETS: 246 10*3/uL (ref 150–400)
RBC: 3.71 MIL/uL — AB (ref 3.87–5.11)
RDW: 13.3 % (ref 11.5–15.5)
WBC: 10 10*3/uL (ref 4.0–10.5)

## 2016-06-05 LAB — HEPARIN LEVEL (UNFRACTIONATED)
Heparin Unfractionated: 0.22 IU/mL — ABNORMAL LOW (ref 0.30–0.70)
Heparin Unfractionated: 0.31 IU/mL (ref 0.30–0.70)

## 2016-06-05 SURGERY — LEFT HEART CATH AND CORONARY ANGIOGRAPHY
Anesthesia: LOCAL

## 2016-06-05 MED ORDER — ATROPINE SULFATE 1 MG/10ML IJ SOSY
PREFILLED_SYRINGE | INTRAMUSCULAR | Status: AC
  Filled 2016-06-05: qty 10

## 2016-06-05 MED ORDER — IOPAMIDOL (ISOVUE-370) INJECTION 76%
INTRAVENOUS | Status: AC
  Filled 2016-06-05: qty 50

## 2016-06-05 MED ORDER — FENTANYL CITRATE (PF) 100 MCG/2ML IJ SOLN
INTRAMUSCULAR | Status: AC
  Filled 2016-06-05: qty 2

## 2016-06-05 MED ORDER — SODIUM CHLORIDE 0.9 % IV SOLN: 250.0000 mL | INTRAVENOUS | Status: DC | PRN

## 2016-06-05 MED ORDER — IOPAMIDOL (ISOVUE-370) INJECTION 76%
INTRAVENOUS | Status: AC
Start: 2016-06-05 — End: 2016-06-05
  Filled 2016-06-05: qty 100

## 2016-06-05 MED ORDER — HEPARIN (PORCINE) IN NACL 2-0.9 UNIT/ML-% IJ SOLN
INTRAMUSCULAR | Status: AC
  Filled 2016-06-05: qty 1000

## 2016-06-05 MED ORDER — SODIUM CHLORIDE 0.9% FLUSH: 3.0000 mL | INTRAVENOUS | Status: DC | PRN

## 2016-06-05 MED ORDER — ADENOSINE 12 MG/4ML IV SOLN
INTRAVENOUS | Status: AC
  Filled 2016-06-05: qty 16

## 2016-06-05 MED ORDER — ADENOSINE (DIAGNOSTIC) 140MCG/KG/MIN
INTRAVENOUS | Status: DC | PRN
  Administered 2016-06-05: 140 ug/kg/min via INTRAVENOUS

## 2016-06-05 MED ORDER — LABETALOL HCL 5 MG/ML IV SOLN: 10.0000 mg | INTRAVENOUS | Status: AC | PRN

## 2016-06-05 MED ORDER — HEPARIN (PORCINE) IN NACL 2-0.9 UNIT/ML-% IJ SOLN
INTRAMUSCULAR | Status: DC | PRN
  Administered 2016-06-05: 1000 mL

## 2016-06-05 MED ORDER — HYDRALAZINE HCL 20 MG/ML IJ SOLN: 5.0000 mg | INTRAMUSCULAR | Status: AC | PRN

## 2016-06-05 MED ORDER — LABETALOL HCL 5 MG/ML IV SOLN
INTRAVENOUS | Status: AC
  Filled 2016-06-05: qty 4

## 2016-06-05 MED ORDER — HEPARIN SODIUM (PORCINE) 1000 UNIT/ML IJ SOLN
INTRAMUSCULAR | Status: AC
  Filled 2016-06-05: qty 1

## 2016-06-05 MED ORDER — LIDOCAINE HCL (PF) 1 % IJ SOLN
INTRAMUSCULAR | Status: DC | PRN
  Administered 2016-06-05: 2 mL via INTRADERMAL

## 2016-06-05 MED ORDER — VERAPAMIL HCL 2.5 MG/ML IV SOLN
INTRAVENOUS | Status: AC
Start: 2016-06-05 — End: 2016-06-05
  Filled 2016-06-05: qty 2

## 2016-06-05 MED ORDER — MIDAZOLAM HCL 2 MG/2ML IJ SOLN
INTRAMUSCULAR | Status: DC | PRN
Start: 2016-06-05 — End: 2016-06-05
  Administered 2016-06-05 (×4): 1 mg via INTRAVENOUS

## 2016-06-05 MED ORDER — FENTANYL CITRATE (PF) 100 MCG/2ML IJ SOLN
INTRAMUSCULAR | Status: DC | PRN
  Administered 2016-06-05: 50 ug via INTRAVENOUS
  Administered 2016-06-05: 25 ug via INTRAVENOUS
  Administered 2016-06-05 (×2): 50 ug via INTRAVENOUS
  Administered 2016-06-05: 25 ug via INTRAVENOUS

## 2016-06-05 MED ORDER — IOPAMIDOL (ISOVUE-370) INJECTION 76%
INTRAVENOUS | Status: DC | PRN
  Administered 2016-06-05: 175 mL via INTRA_ARTERIAL

## 2016-06-05 MED ORDER — HEPARIN SODIUM (PORCINE) 1000 UNIT/ML IJ SOLN
INTRAMUSCULAR | Status: DC | PRN
Start: 2016-06-05 — End: 2016-06-05
  Administered 2016-06-05: 6000 [IU] via INTRAVENOUS

## 2016-06-05 MED ORDER — SODIUM CHLORIDE 0.9% FLUSH: 3.0000 mL | Freq: Two times a day (BID) | INTRAVENOUS | Status: DC

## 2016-06-05 MED ORDER — SODIUM CHLORIDE 0.9 % IV SOLN
INTRAVENOUS | Status: DC
  Administered 2016-06-05: 19:00:00 via INTRAVENOUS

## 2016-06-05 MED ORDER — LIDOCAINE HCL (PF) 1 % IJ SOLN
INTRAMUSCULAR | Status: AC
Start: 2016-06-05 — End: 2016-06-05
  Filled 2016-06-05: qty 30

## 2016-06-05 MED ORDER — MIDAZOLAM HCL 2 MG/2ML IJ SOLN
INTRAMUSCULAR | Status: AC
  Filled 2016-06-05: qty 2

## 2016-06-05 MED ORDER — VERAPAMIL HCL 2.5 MG/ML IV SOLN
INTRAVENOUS | Status: AC
  Filled 2016-06-05: qty 2

## 2016-06-05 SURGICAL SUPPLY — 29 items
BALLN ANGIOSCULPT RX 3.0X10 (BALLOONS) ×2
BALLN EUPHORA RX 2.5X12 (BALLOONS) ×2
BALLN ~~LOC~~ MOZEC 3.5X15 (BALLOONS) ×2
BALLOON ANGIOSCULPT RX 3.0X10 (BALLOONS) ×1 IMPLANT
BALLOON EUPHORA RX 2.5X12 (BALLOONS) ×1 IMPLANT
BALLOON ~~LOC~~ MOZEC 3.5X15 (BALLOONS) ×1 IMPLANT
CATH INFINITI 5FR MULTPACK ANG (CATHETERS) ×2 IMPLANT
CATH LAUNCHER 5F JL3.5 (CATHETERS) ×1 IMPLANT
CATH LAUNCHER 5F JR4 (CATHETERS) ×2 IMPLANT
CATH VISTA GUIDE 6FR XBLAD3.5 (CATHETERS) ×2 IMPLANT
CATHETER LAUNCHER 5F JL3.5 (CATHETERS) ×2
COVER PRB 48X5XTLSCP FOLD TPE (BAG) ×1 IMPLANT
COVER PROBE 5X48 (BAG) ×1
GLIDESHEATH SLEND A-KIT 6F 22G (SHEATH) ×2 IMPLANT
GLIDESHEATH SLEND SS 6F .021 (SHEATH) ×2 IMPLANT
GUIDEWIRE INQWIRE 1.5J.035X260 (WIRE) ×1 IMPLANT
GUIDEWIRE PRESSURE COMET II (WIRE) ×2 IMPLANT
INQWIRE 1.5J .035X260CM (WIRE) ×2
KIT ENCORE 26 ADVANTAGE (KITS) ×2 IMPLANT
KIT ESSENTIALS PG (KITS) ×2 IMPLANT
KIT HEART LEFT (KITS) ×2 IMPLANT
PACK CARDIAC CATHETERIZATION (CUSTOM PROCEDURE TRAY) ×2 IMPLANT
SHEATH PINNACLE 5F 10CM (SHEATH) ×2 IMPLANT
SHEATH PINNACLE 6F 10CM (SHEATH) ×2 IMPLANT
SYR MEDRAD MARK V 150ML (SYRINGE) ×2 IMPLANT
TRANSDUCER W/STOPCOCK (MISCELLANEOUS) ×2 IMPLANT
TUBING CIL FLEX 10 FLL-RA (TUBING) ×2 IMPLANT
WIRE ASAHI PROWATER 180CM (WIRE) ×2 IMPLANT
WIRE EMERALD 3MM-J .035X150CM (WIRE) ×2 IMPLANT

## 2016-06-05 NOTE — Progress Notes (Signed)
Patient with no complaints or concerns during 7pm - 7am shift.  Tia Gelb, RN 

## 2016-06-05 NOTE — Progress Notes (Signed)
Spoke with [name],NP informed of patient event.

## 2016-06-05 NOTE — Progress Notes (Signed)
*  PRELIMINARY RESULTS* Vascular Ultrasound Carotid Duplex (Doppler) has been completed.  Preliminary findings: Bilateral: No significant (1-39%) ICA stenosis. Antegrade vertebral flow.  Bilateral >50% ECA stenosis, by velocity (>250 cm/s)   Landry Mellow, RDMS, RVT  06/05/2016, 10:04 AM

## 2016-06-05 NOTE — Progress Notes (Addendum)
Pt's daughters are at bedside, as expected. Per daughters a family member called; and the conversation had bothered the patient to the point of agitation. Pt reports rapid onset of left chest pain at this time. EKG to be performed. Pt just had nitro patch replaced, metoprolol PO per orders, plavix PO per orders, Heparin drip infusing.   Pt currently displaying sinus rhythm on the monitor, HR of  89.  Pt denies need for EKG. Per protocol, and given pt hx/condition EKG was performed. Page to cardiology to notify, pt is awaiting cath lab.   NP with Cardiology paged @ [phone]

## 2016-06-05 NOTE — Progress Notes (Signed)
CRITICAL VALUE ALERT  Critical value received:  Troponin 0.06  Date of notification:  06/05/2016  Time of notification:  3:20  Critical value read back:Yes.    Nurse who received alert:  Amadeus Oyama   MD notified (1st page):  Turner T  Time of first page:  3:21  MD notified (2nd page):  Time of second page:  Responding MD:  Ashok Norris  Time MD responded:  3:32  No new orders at this time per MD. Will continue to monitor.  Elivia Robotham, RN

## 2016-06-05 NOTE — Interval H&P Note (Signed)
History and Physical Interval Note:  06/05/2016 3:31 PM  Kathryn Garrett  has presented today for surgery, with the diagnosis of unstable angina  The various methods of treatment have been discussed with the patient and family. After consideration of risks, benefits and other options for treatment, the patient has consented to  Procedure(s): Left Heart Cath and Coronary Angiography (N/A) with possible Percutaneous Coronary Intervention as a surgical intervention .  The patient's history has been reviewed, patient examined, no change in status, stable for surgery.  I have reviewed the patient's chart and labs.  Questions were answered to the patient's satisfaction.    Cath Lab Visit (complete for each Cath Lab visit)  Clinical Evaluation Leading to the Procedure:   ACS: Yes.    Non-ACS:    Anginal Classification: CCS III  Anti-ischemic medical therapy: Minimal Therapy (1 class of medications)  Non-Invasive Test Results: No non-invasive testing performed  Prior CABG: No previous CABG    Glenetta Hew

## 2016-06-05 NOTE — Progress Notes (Signed)
ANTICOAGULATION CONSULT NOTE - Follow Up Consult  Pharmacy Consult for Heparin  Indication: chest pain/ACS  Allergies  Allergen Reactions  . Azithromycin Rash    Patient Measurements: Height: 4\' 11"  (149.9 cm) Weight: 127 lb 3.2 oz (57.7 kg) IBW/kg (Calculated) : 43.2  Vital Signs: Temp: 97.6 F (36.4 C) (01/22 0222) Temp Source: Oral (01/22 0222) BP: 100/35 (01/22 0222) Pulse Rate: 68 (01/22 0222)  Labs:  Recent Labs  06/04/16 1316 06/04/16 1904 06/05/16 0225  HGB 12.9  --   --   HCT 38.8  --   --   PLT 295  --   --   LABPROT  --   --  16.2*  INR  --   --  1.29  HEPARINUNFRC  --   --  0.22*  CREATININE 0.68  --  0.86  TROPONINI  --  <0.03 0.06*    Estimated Creatinine Clearance: 46.4 mL/min (by C-G formula based on SCr of 0.86 mg/dL).  Assessment: Heparin for CP, initial heparin level is low, no issues per RN, likely cath today  Goal of Therapy:  Heparin level 0.3-0.7 units/ml Monitor platelets by anticoagulation protocol: Yes   Plan:  -Inc heparin to 800 units/hr -1200 HL  Kathryn Garrett 06/05/2016,3:20 AM

## 2016-06-05 NOTE — H&P (View-Only) (Signed)
Progress Note  Patient Name: Kathryn Garrett Date of Encounter: 06/05/2016  Primary Cardiologist: Dr. Irish Lack  Subjective   Feels well, denies chest pain and SOB.   Inpatient Medications    Scheduled Meds: . aspirin EC  81 mg Oral Daily  . atorvastatin  40 mg Oral q1800  . clopidogrel  75 mg Oral Daily  . metoprolol tartrate  25 mg Oral BID  . nitroGLYCERIN  0.2 mg Transdermal Daily  . sodium chloride flush  3 mL Intravenous Q12H   Continuous Infusions: . sodium chloride 1 mL/kg/hr (06/05/16 0645)  . heparin 800 Units/hr (06/05/16 0328)   PRN Meds: sodium chloride, acetaminophen, albuterol, nitroGLYCERIN, ondansetron (ZOFRAN) IV, sodium chloride flush   Vital Signs    Vitals:   06/04/16 1840 06/04/16 2100 06/05/16 0222 06/05/16 0705  BP: (!) 102/43 (!) 105/40 (!) 100/35 (!) 111/46  Pulse: 80 72 68 70  Resp: 16 17 18 18   Temp: 98.4 F (36.9 C) 97.3 F (36.3 C) 97.6 F (36.4 C) 97.3 F (36.3 C)  TempSrc: Oral Oral Oral Oral  SpO2: 93% 93% 100% 99%  Weight: 127 lb 3.2 oz (57.7 kg)   128 lb 1.6 oz (58.1 kg)  Height: 4\' 11"  (1.499 m)       Intake/Output Summary (Last 24 hours) at 06/05/16 1035 Last data filed at 06/05/16 0700  Gross per 24 hour  Intake            459.3 ml  Output              950 ml  Net           -490.7 ml   Filed Weights   06/04/16 1745 06/04/16 1840 06/05/16 0705  Weight: 132 lb 0.9 oz (59.9 kg) 127 lb 3.2 oz (57.7 kg) 128 lb 1.6 oz (58.1 kg)    Telemetry     NSR- Personally Reviewed  ECG    NSR with some nonspecific ST changes.  - Personally Reviewed  Physical Exam   GEN: No acute distress.  Neck: No JVD Cardiac: RRR, no murmurs, rubs, or gallops.  Respiratory: Clear to auscultation bilaterally. GI: Soft, nontender, non-distended  MS: No edema; No deformity. Neuro:  AAOx3. Psych: Normal affect  Labs    Chemistry Recent Labs Lab 06/04/16 1316 06/05/16 0225  NA 140 141  K 4.3 4.3  CL 107 103  CO2 25 27    GLUCOSE 98 118*  BUN 9 8  CREATININE 0.68 0.86  CALCIUM 9.3 9.2  GFRNONAA >60 >60  GFRAA >60 >60  ANIONGAP 8 11     Hematology Recent Labs Lab 06/04/16 1316 06/05/16 0911  WBC 9.2 10.0  RBC 4.12 3.71*  HGB 12.9 11.4*  HCT 38.8 35.5*  MCV 94.2 95.7  MCH 31.3 30.7  MCHC 33.2 32.1  RDW 13.2 13.3  PLT 295 246    Cardiac Enzymes Recent Labs Lab 06/04/16 1904 06/05/16 0225  TROPONINI <0.03 0.06*    Recent Labs Lab 06/04/16 1327  TROPIPOC 0.00     BNPNo results for input(s): BNP, PROBNP in the last 168 hours.   DDimer No results for input(s): DDIMER in the last 168 hours.   Radiology    Dg Chest 2 View  Result Date: 06/04/2016 CLINICAL DATA:  Left arm pain over the last 4 days. No chest pain. Previous history of myocardial infarction and stents. EXAM: CHEST  2 VIEW COMPARISON:  10/27/2015 FINDINGS: Heart size is normal. There is aortic atherosclerosis. There  is chronic pleural and parenchymal scarring at the left base laterally. No evidence of active infiltrate, mass, effusion or collapse. No pulmonary edema. No significant bone finding. IMPRESSION: Chronic pleural and parenchymal scarring at the left base. No active process. Aortic atherosclerosis. Electronically Signed   By: Nelson Chimes M.D.   On: 06/04/2016 15:12      Patient Profile     71 y.o. female with history of HTN, HL, COPD with ongoing tobacco use, CAD status post inferior MI treated with bare-metal stent to the RCA in 2010. status post DES to the proximal to mid RCA 06/2010 for instent restenosis of the bare-metal stent.She had moderate left main/LAD disease which was not significant.  She was admitted in 9/17 for [place]. Cath with ISR of RCA underwent balloon angioplasty of RCA with ISR 75%->0%. Plavix restarted.   Has been doing fine until Tuesday 05/30/16. Since that time has increasing episodes of CP radiating to left arm. Has about 4-5 episodes per day. Feels like previous angina. Relieved quickly  with NTG. For cath today.   Assessment & Plan    1. Unstable angina: For cath today. Continue ASA, Plavix.   2. CAD s/p PCI of RCA due to in-stent restenosis 9/17  3. Ongoing tobacco abuse  4. R Carotid bruit: vascular ultrasound done this am, bilateral ECA stenosis >50%. No significant ICA.     Signed, Arbutus Leas, NP  06/05/2016, 10:35 AM   Patient seen and examined and history reviewed. Agree with above findings and plan. Patient is still having shoulder pain intermittently. States her symptoms are typical for her angina. She continues to smoke. She is s/p stenting of the proximal RCA in 2010 with BMS. Repeat stenting in 2012 with DES. Recurrent stenosis in September 2017 with POBA. Also has 50% ostial LAD disease. FFR of this lesion in 2012 was not significant. Plan cardiac cath today. The procedure and risks were reviewed including but not limited to death, myocardial infarction, stroke, arrythmias, bleeding, transfusion, emergency surgery, dye allergy, or renal dysfunction. The patient voices understanding and is agreeable to proceed.Marland Kitchen  Peter Martinique, [place] 06/05/2016 11:02 AM

## 2016-06-05 NOTE — Progress Notes (Signed)
Progress Note  Patient Name: Kathryn Garrett Date of Encounter: 06/05/2016  Primary Cardiologist: Dr. Irish Lack  Subjective   Feels well, denies chest pain and SOB.   Inpatient Medications    Scheduled Meds: . aspirin EC  81 mg Oral Daily  . atorvastatin  40 mg Oral q1800  . clopidogrel  75 mg Oral Daily  . metoprolol tartrate  25 mg Oral BID  . nitroGLYCERIN  0.2 mg Transdermal Daily  . sodium chloride flush  3 mL Intravenous Q12H   Continuous Infusions: . sodium chloride 1 mL/kg/hr (06/05/16 0645)  . heparin 800 Units/hr (06/05/16 0328)   PRN Meds: sodium chloride, acetaminophen, albuterol, nitroGLYCERIN, ondansetron (ZOFRAN) IV, sodium chloride flush   Vital Signs    Vitals:   06/04/16 1840 06/04/16 2100 06/05/16 0222 06/05/16 0705  BP: (!) 102/43 (!) 105/40 (!) 100/35 (!) 111/46  Pulse: 80 72 68 70  Resp: 16 17 18 18   Temp: 98.4 F (36.9 C) 97.3 F (36.3 C) 97.6 F (36.4 C) 97.3 F (36.3 C)  TempSrc: Oral Oral Oral Oral  SpO2: 93% 93% 100% 99%  Weight: 127 lb 3.2 oz (57.7 kg)   128 lb 1.6 oz (58.1 kg)  Height: 4\' 11"  (1.499 m)       Intake/Output Summary (Last 24 hours) at 06/05/16 1035 Last data filed at 06/05/16 0700  Gross per 24 hour  Intake            459.3 ml  Output              950 ml  Net           -490.7 ml   Filed Weights   06/04/16 1745 06/04/16 1840 06/05/16 0705  Weight: 132 lb 0.9 oz (59.9 kg) 127 lb 3.2 oz (57.7 kg) 128 lb 1.6 oz (58.1 kg)    Telemetry     NSR- Personally Reviewed  ECG    NSR with some nonspecific ST changes.  - Personally Reviewed  Physical Exam   GEN: No acute distress.  Neck: No JVD Cardiac: RRR, no murmurs, rubs, or gallops.  Respiratory: Clear to auscultation bilaterally. GI: Soft, nontender, non-distended  MS: No edema; No deformity. Neuro:  AAOx3. Psych: Normal affect  Labs    Chemistry Recent Labs Lab 06/04/16 1316 06/05/16 0225  NA 140 141  K 4.3 4.3  CL 107 103  CO2 25 27    GLUCOSE 98 118*  BUN 9 8  CREATININE 0.68 0.86  CALCIUM 9.3 9.2  GFRNONAA >60 >60  GFRAA >60 >60  ANIONGAP 8 11     Hematology Recent Labs Lab 06/04/16 1316 06/05/16 0911  WBC 9.2 10.0  RBC 4.12 3.71*  HGB 12.9 11.4*  HCT 38.8 35.5*  MCV 94.2 95.7  MCH 31.3 30.7  MCHC 33.2 32.1  RDW 13.2 13.3  PLT 295 246    Cardiac Enzymes Recent Labs Lab 06/04/16 1904 06/05/16 0225  TROPONINI <0.03 0.06*    Recent Labs Lab 06/04/16 1327  TROPIPOC 0.00     BNPNo results for input(s): BNP, PROBNP in the last 168 hours.   DDimer No results for input(s): DDIMER in the last 168 hours.   Radiology    Dg Chest 2 View  Result Date: 06/04/2016 CLINICAL DATA:  Left arm pain over the last 4 days. No chest pain. Previous history of myocardial infarction and stents. EXAM: CHEST  2 VIEW COMPARISON:  10/27/2015 FINDINGS: Heart size is normal. There is aortic atherosclerosis. There  is chronic pleural and parenchymal scarring at the left base laterally. No evidence of active infiltrate, mass, effusion or collapse. No pulmonary edema. No significant bone finding. IMPRESSION: Chronic pleural and parenchymal scarring at the left base. No active process. Aortic atherosclerosis. Electronically Signed   By: Nelson Chimes M.D.   On: 06/04/2016 15:12      Patient Profile     71 y.o. female with history of HTN, HL, COPD with ongoing tobacco use, CAD status post inferior MI treated with bare-metal stent to the RCA in 2010. status post DES to the proximal to mid RCA 06/2010 for instent restenosis of the bare-metal stent.She had moderate left main/LAD disease which was not significant.  She was admitted in 9/17 for [place]. Cath with ISR of RCA underwent balloon angioplasty of RCA with ISR 75%->0%. Plavix restarted.   Has been doing fine until Tuesday 05/30/16. Since that time has increasing episodes of CP radiating to left arm. Has about 4-5 episodes per day. Feels like previous angina. Relieved quickly  with NTG. For cath today.   Assessment & Plan    1. Unstable angina: For cath today. Continue ASA, Plavix.   2. CAD s/p PCI of RCA due to in-stent restenosis 9/17  3. Ongoing tobacco abuse  4. R Carotid bruit: vascular ultrasound done this am, bilateral ECA stenosis >50%. No significant ICA.     Signed, Arbutus Leas, NP  06/05/2016, 10:35 AM   Patient seen and examined and history reviewed. Agree with above findings and plan. Patient is still having shoulder pain intermittently. States her symptoms are typical for her angina. She continues to smoke. She is s/p stenting of the proximal RCA in 2010 with BMS. Repeat stenting in 2012 with DES. Recurrent stenosis in September 2017 with POBA. Also has 50% ostial LAD disease. FFR of this lesion in 2012 was not significant. Plan cardiac cath today. The procedure and risks were reviewed including but not limited to death, myocardial infarction, stroke, arrythmias, bleeding, transfusion, emergency surgery, dye allergy, or renal dysfunction. The patient voices understanding and is agreeable to proceed.Marland Kitchen  Jerney Baksh [place], [place] 06/05/2016 11:02 AM

## 2016-06-06 ENCOUNTER — Encounter (HOSPITAL_COMMUNITY): Payer: Self-pay | Admitting: Cardiology

## 2016-06-06 ENCOUNTER — Telehealth: Payer: Self-pay | Admitting: *Deleted

## 2016-06-06 DIAGNOSIS — I10 Essential (primary) hypertension: Secondary | ICD-10-CM

## 2016-06-06 LAB — CBC
HCT: 35.9 % — ABNORMAL LOW (ref 36.0–46.0)
Hemoglobin: 11.7 g/dL — ABNORMAL LOW (ref 12.0–15.0)
MCH: 30.7 pg (ref 26.0–34.0)
MCHC: 32.6 g/dL (ref 30.0–36.0)
MCV: 94.2 fL (ref 78.0–100.0)
Platelets: 229 10*3/uL (ref 150–400)
RBC: 3.81 MIL/uL — ABNORMAL LOW (ref 3.87–5.11)
RDW: 13.5 % (ref 11.5–15.5)
WBC: 9.7 10*3/uL (ref 4.0–10.5)

## 2016-06-06 LAB — POCT ACTIVATED CLOTTING TIME: ACTIVATED CLOTTING TIME: 153 s

## 2016-06-06 LAB — BASIC METABOLIC PANEL
Anion gap: 7 (ref 5–15)
BUN: 10 mg/dL (ref 6–20)
CO2: 24 mmol/L (ref 22–32)
CREATININE: 0.64 mg/dL (ref 0.44–1.00)
Calcium: 8.7 mg/dL — ABNORMAL LOW (ref 8.9–10.3)
Chloride: 106 mmol/L (ref 101–111)
GFR calc Af Amer: >60 mL/min (ref >60)
GFR calc non Af Amer: >60 mL/min (ref >60)
Glucose, Bld: 140 mg/dL — ABNORMAL HIGH (ref 65–99)
Potassium: 3.9 mmol/L (ref 3.5–5.1)
SODIUM: 137 mmol/L (ref 135–145)

## 2016-06-06 MED ORDER — ANGIOPLASTY BOOK
Freq: Once | Status: DC
  Filled 2016-06-06: qty 1

## 2016-06-06 MED FILL — Verapamil HCl IV Soln 2.5 MG/ML: INTRAVENOUS | Qty: 2 | Status: AC

## 2016-06-06 MED FILL — Labetalol HCl IV Soln 5 MG/ML: INTRAVENOUS | Qty: 4 | Status: AC

## 2016-06-06 NOTE — Telephone Encounter (Signed)
-----   Message from Arbutus Leas, NP sent at 06/06/2016 10:29 AM EST ----- Regarding: Need TOC phonecall  Appt. 2/1 with Dayna. Thank you!

## 2016-06-06 NOTE — Progress Notes (Signed)
Reviewed discharge instructions with patient. Awaiting wheelchair for discharge.   Ledia Han[place], Mervin Kung RN

## 2016-06-06 NOTE — Progress Notes (Signed)
Progress Note  Patient Name: Kathryn Garrett Date of Encounter: 06/06/2016  Primary Cardiologist: Dr. Irish Lack  Subjective   Feels well. Denies chest pain and SOB.   Inpatient Medications    Scheduled Meds: . angioplasty book   Does not apply Once  . aspirin EC  81 mg Oral Daily  . atorvastatin  40 mg Oral q1800  . clopidogrel  75 mg Oral Daily  . metoprolol tartrate  25 mg Oral BID  . nitroGLYCERIN  0.2 mg Transdermal Daily  . sodium chloride flush  3 mL Intravenous Q12H   Continuous Infusions:  PRN Meds: sodium chloride, acetaminophen, albuterol, nitroGLYCERIN, ondansetron (ZOFRAN) IV, sodium chloride flush   Vital Signs    Vitals:   06/05/16 2335 06/05/16 2338 06/06/16 0000 06/06/16 0220  BP: (!) 119/41 (!) 119/41 (!) 115/36 113/66  Pulse: 81 94 81 75  Resp: 19  20 11   Temp:   98 F (36.7 C) 98.3 F (36.8 C)  TempSrc:   Oral Oral  SpO2: 95%  95% 97%  Weight:    127 lb 13.9 oz (58 kg)  Height:        Intake/Output Summary (Last 24 hours) at 06/06/16 0659 Last data filed at 06/06/16 0224  Gross per 24 hour  Intake            277.5 ml  Output             1650 ml  Net          -1372.5 ml   Filed Weights   06/04/16 1840 06/05/16 0705 06/06/16 0220  Weight: 127 lb 3.2 oz (57.7 kg) 128 lb 1.6 oz (58.1 kg) 127 lb 13.9 oz (58 kg)    Telemetry    NSR - Personally Reviewed  ECG     NSR- Personally Reviewed  Physical Exam   GEN: No acute distress.  Neck: No JVD Cardiac: RRR, no murmurs, rubs, or gallops. Right radial and right femoral site stable.  Respiratory: Clear to auscultation bilaterally. GI: Soft, nontender, non-distended  MS: No edema; No deformity. Neuro:  AAOx3. Psych: Normal affect  Labs    Chemistry Recent Labs Lab 06/04/16 1316 06/05/16 0225 06/06/16 0205  NA 140 141 137  K 4.3 4.3 3.9  CL 107 103 106  CO2 25 27 24   GLUCOSE 98 118* 140*  BUN 9 8 10   CREATININE 0.68 0.86 0.64  CALCIUM 9.3 9.2 8.7*  GFRNONAA >60 >60 >60   GFRAA >60 >60 >60  ANIONGAP 8 11 7      Hematology Recent Labs Lab 06/04/16 1316 06/05/16 0911 06/06/16 0205  WBC 9.2 10.0 9.7  RBC 4.12 3.71* 3.81*  HGB 12.9 11.4* 11.7*  HCT 38.8 35.5* 35.9*  MCV 94.2 95.7 94.2  MCH 31.3 30.7 30.7  MCHC 33.2 32.1 32.6  RDW 13.2 13.3 13.5  PLT 295 246 229    Cardiac Enzymes Recent Labs Lab 06/04/16 1904 06/05/16 0225 06/05/16 0911  TROPONINI <0.03 0.06* 0.04*    Recent Labs Lab 06/04/16 1327  TROPIPOC 0.00     BNPNo results for input(s): BNP, PROBNP in the last 168 hours.   DDimer No results for input(s): DDIMER in the last 168 hours.   Radiology    Dg Chest 2 View  Result Date: 06/04/2016 CLINICAL DATA:  Left arm pain over the last 4 days. No chest pain. Previous history of myocardial infarction and stents. EXAM: CHEST  2 VIEW COMPARISON:  10/27/2015 FINDINGS: Heart size is normal.  There is aortic atherosclerosis. There is chronic pleural and parenchymal scarring at the left base laterally. No evidence of active infiltrate, mass, effusion or collapse. No pulmonary edema. No significant bone finding. IMPRESSION: Chronic pleural and parenchymal scarring at the left base. No active process. Aortic atherosclerosis. Electronically Signed   By: Nelson Chimes M.D.   On: 06/04/2016 15:12    Cardiac Studies   Coronary Balloon Angioplasty  Left Heart Cath and Coronary Angiography 06/05/16    Ost RCA lesion, 30 %stenosed.  LM lesion, 30 %stenosed.  Ost LAD lesion, 50 %stenosed - looks similar to prior catheterization. FFR 0.87  The left ventricular systolic function is normal.  LV end diastolic pressure is normal.  The left ventricular ejection fraction is 55-65% by visual estimate.  There is mild (2+) mitral regurgitation.  _______CULPRIT LESION_____________  Prox RCA Overlapped BMS-DES stents, 99 % -n-stent re-stenosed.  PTCA with 3.0 mm Scoring Balloon with Post-dilation using 3.5 mm Section Balloon was perfromed. Post  intervention, there is a 0% residual stenosis.   Recurrent in-stent restenosis of the overlapped stent (BMS and DES) in proximal RCA treated with aggressive angioplasty. Repeat FFR on ostial LAD confirms this is not likely significant.   Plan:  She'll be transferred to 6 Central post procedure unit for sheath removal and post PCI care.  Continue aggressive risk factor modification  Continue aspirin and Plavix lifelong  Smoking cessation counseling is mandatory to maintain stent patency  Patient Profile     71 y.o. female with history of HTN, HL, COPD with ongoing tobacco use, CAD status post inferior MI treated with bare-metal stent to the RCA in 2010. status post DES to the proximal to mid RCA 06/2010 for instent restenosis of the bare-metal stent.She had moderate left main/LAD disease which was not significant.  She was admitted in 9/17 for [place]. Cath with ISR of RCA underwent balloon angioplasty of RCA with ISR 75%->0%. Plavix restarted.   Has been doing fine until Tuesday 05/30/16. Since that time has increasing episodes of CP radiating to left arm. Has about 4-5 episodes per day. Feels like previous angina.Relieved quicklywith NTG. Cath on 06/05/16 showed 99% in stent restenosis of RCA.    Assessment & Plan    1. CAD s/p PTCA with scoring balloon: original stent placed to RCA in 2010 with BMS, then repeat stenting in 2012 with DES. Recurrent stenosis in Sept. 2017 with POBA. Now again with restenosis of RCA, PTCA with scoring balloon yesterday.   She will be on ASA and Plavix indefinitely. Smoking cessation is imperative.   2. Ongoing tobacco abuse.   3. R Carotid bruit: vascular ultrasound done this am, bilateral ECA stenosis >50%. No significant ICA.  4. HTN: Continue current regimen with metoprolol 25mg  BID. BP well controlled.   Signed, Arbutus Leas, NP  06/06/2016, 6:59 AM   Patient seen and examined and history reviewed. Agree with above findings and plan.  Patient feels very well this am. No chest or shoulder pain. No dyspnea. Right radial and right groin sites without hematoma. Ecg is normal. Labs Ok. No arrhythmia. Lipids look great on lipitor. We discussed the importance of smoking cessation. She is motivated to quit. Has tried Chantix and nicotine products in the past without benefit. Planning to quit cold [place]. She is stable for DC today.  Kable Haywood [place], [place] 06/06/2016 7:56 AM

## 2016-06-06 NOTE — Progress Notes (Signed)
CARDIAC REHAB PHASE I   PRE:  Rate/Rhythm: 82 SR    BP: sitting 119/48    SaO2:   MODE:  Ambulation: 500 ft   POST:  Rate/Rhythm: 103 ST    BP: sitting 113/35     SaO2:   Tolerated well, no c/o. Sts she has to quit smoking. She is trying to get motivated. Long discussion of quitting cold [place] and finding distractions/substitutions. Her whole family smokes. Encouraged her to be open and honest with them. She is interested in [facility] classes. Declined fake cigarette as it just "made her mad" previously. She wants to do CRPII this time and I will refer again to [place]. Sts she needs to "be hounded". Gave walking gl and reminders of Plavix.She understands. [place], ACSM 06/06/2016 8:37 AM

## 2016-06-06 NOTE — Discharge Summary (Signed)
Discharge Summary    Patient ID: Kathryn Garrett,  MRN: [id number], DOB/AGE: 1945-08-04 71 y.o.  Admit date: 06/04/2016 Discharge date: 06/06/2016  Primary Care Provider: Imagene Riches Primary Cardiologist: Dr. Irish Lack  Discharge Diagnoses    Principal Problem:   Unstable angina [facility]) Active Problems:   CAD (coronary artery disease)   HTN (hypertension)   Current tobacco use   Chest pain   Allergies Allergies  Allergen Reactions  . Azithromycin Rash    Diagnostic Studies/Procedures    Coronary Balloon Angioplasty  Left Heart Cath and Coronary Angiography 06/05/16   Ost RCA lesion, 30 %stenosed.  LM lesion, 30 %stenosed.  Ost LAD lesion, 50 %stenosed - looks similar to prior catheterization. FFR 0.87  The left ventricular systolic function is normal.  LV end diastolic pressure is normal.  The left ventricular ejection fraction is 55-65% by visual estimate.  There is mild (2+) mitral regurgitation.  _______CULPRIT LESION_____________  Prox RCA Overlapped BMS-DES stents, 99 % -n-stent re-stenosed.  PTCA with 3.0 mm Scoring Balloon with Post-dilation using 3.5 mm [place] Balloon was perfromed. Post intervention, there is a 0% residual stenosis.   Recurrent in-stent restenosis of the overlapped stent (BMS and DES) in proximal RCA treated with aggressive angioplasty. Repeat FFR on ostial LAD confirms this is not likely significant.   Plan:  She'll be transferred to 6 Central post procedure unit for sheath removal and post PCI care.  Continue aggressive risk factor modification  Continue aspirin and Plavix lifelong  Smoking cessation counseling is mandatory to maintain stent patency  Noninvasive Vascular Lab  Carotid Duplex Study Summary:  - Bilateral: No significant (1-39%) ICA stenosis. Antegrade   vertebral flow.   Bilateral >50% ECA stenosis, by velocity. - Appears to be >50% stenosis right subclavian artery    _____________   History  of Present Illness   71 y.o.femalewith history of HTN, HL, COPD with ongoing tobacco use, CAD status post inferior MI treated with bare-metal stent to the RCA in 2010. status post DES to the proximal to mid RCA 06/2010 for instent restenosis of the bare-metal stent.She had moderate left main/LAD disease which was not significant.  She was admitted in 9/17 for [place]. Cath with ISR of RCA underwent balloon angioplasty of RCA with ISR 75%->0%. Plavix restarted.   Has been doing fine until 05/30/16. Since that time has increasing episodes of CP radiating to left arm. Has about 4-5 episodes per day. Feels like previous angina. Relieved quickly with NTG.   CP more frequent today so came to ER. CP relieved with NTG. Now pain free. ECG with mild ST depression in inferior lead. 1st troponin normal.   Still smoking < 1ppd. Taking Plavix    Hospital Course     1. CAD s/p PTCA with scoring balloon: original stent placed to RCA in 2010 with BMS, then repeat stenting in 2012 with DES. Recurrent stenosis in Sept. 2017 with POBA. Now again with restenosis of RCA, PTCA with scoring balloon yesterday.   She will be on ASA and Plavix indefinitely. Smoking cessation is imperative.   2. Ongoing tobacco abuse:  We discussed the importance of smoking cessation. She is motivated to quit. Has tried Chantix and nicotine products in the past without benefit. Planning to quit [place].   3. R Carotid bruit: vascular ultrasound done this am, bilateral ECA stenosis >50%. No significant ICA.  4. HTN: Continue metoprolol 25mg  BID. Well controlled.    She was seen today by Dr.  [place] and deemed suitable for discharge.   _____________  Discharge Vitals Blood pressure (!) 113/35, pulse 77, temperature 98.1 F (36.7 C), temperature source Oral, resp. rate 15, height 4\' 11"  (1.499 m), weight 127 lb 13.9 oz (58 kg), SpO2 92 %.  Filed Weights   06/04/16 1840 06/05/16 0705 06/06/16 0220  Weight: 127 lb 3.2 oz  (57.7 kg) 128 lb 1.6 oz (58.1 kg) 127 lb 13.9 oz (58 kg)    Labs & Radiologic Studies     CBC  Recent Labs  06/05/16 0911 06/06/16 0205  WBC 10.0 9.7  HGB 11.4* 11.7*  HCT 35.5* 35.9*  MCV 95.7 94.2  PLT 246 [id number]   Basic Metabolic Panel  Recent Labs  06/05/16 0225 06/06/16 0205  NA 141 137  K 4.3 3.9  CL 103 106  CO2 27 24  GLUCOSE 118* 140*  BUN 8 10  CREATININE 0.86 0.64  CALCIUM 9.2 8.7*   Cardiac Enzymes  Recent Labs  06/04/16 1904 06/05/16 0225 06/05/16 0911  TROPONINI <0.03 0.06* 0.04*   Fasting Lipid Panel  Recent Labs  06/05/16 0911  CHOL 113  HDL 32*  LDLCALC 57  TRIG 120  CHOLHDL 3.5    Dg Chest 2 View  Result Date: 06/04/2016 CLINICAL DATA:  Left arm pain over the last 4 days. No chest pain. Previous history of myocardial infarction and stents. EXAM: CHEST  2 VIEW COMPARISON:  10/27/2015 FINDINGS: Heart size is normal. There is aortic atherosclerosis. There is chronic pleural and parenchymal scarring at the left base laterally. No evidence of active infiltrate, mass, effusion or collapse. No pulmonary edema. No significant bone finding. IMPRESSION: Chronic pleural and parenchymal scarring at the left base. No active process. Aortic atherosclerosis. Electronically Signed   By: Nelson Chimes M.D.   On: 06/04/2016 15:12    Disposition   Pt is being discharged home today in good condition.  Follow-up Plans & Appointments    Follow-up Information    Charlie Pitter, PA-C Follow up on 06/15/2016.   Specialties:  Cardiology, Radiology Why:  at 9:30 am for hospital follow up  Contact information: [address] [place] [address] [phone]          Discharge Instructions    Amb Referral to Cardiac Rehabilitation    Complete by:  As directed    Diagnosis:  PTCA      Discharge Medications   Current Discharge Medication List    CONTINUE these medications which have NOT CHANGED   Details  acetaminophen  (TYLENOL) 500 MG tablet Take 1,000 mg by mouth every 6 (six) hours as needed for headache.    albuterol (PROVENTIL HFA;VENTOLIN HFA) 108 (90 Base) MCG/ACT inhaler Inhale 2 puffs into the lungs every 4 (four) hours as needed for wheezing or shortness of breath.    aspirin EC 81 MG tablet Take 81 mg by mouth at bedtime.     atorvastatin (LIPITOR) 40 MG tablet Take 1 tablet (40 mg total) by mouth daily at 6 PM. Qty: 30 tablet, Refills: 6    Buprenorphine HCl-Naloxone HCl 8-2 MG FILM Place 1 Film under the tongue 3 (three) times daily.    clopidogrel (PLAVIX) 75 MG tablet Take 1 tablet (75 mg total) by mouth daily. Qty: 90 tablet, Refills: 3    metoprolol tartrate (LOPRESSOR) 25 MG tablet Take 1 tablet (25 mg total) by mouth 2 (two) times daily. Qty: 180 tablet, Refills: 3    nitroGLYCERIN (NITROSTAT) 0.4 MG  SL tablet Place 1 tablet (0.4 mg total) under the tongue every 5 (five) minutes as needed for chest pain. Qty: 25 tablet, Refills: 4         Aspirin prescribed at discharge?  Yes High Intensity Statin Prescribed? (Lipitor 40-80mg  or Crestor 20-40mg ): Yes Beta Blocker Prescribed? Yes For EF 40% or less, Was ACEI/ARB Prescribed? No: EF normal  ADP Receptor Inhibitor Prescribed? (i.e. Plavix etc.-Includes Medically Managed Patients): Yes For EF <40%, Aldosterone Inhibitor Prescribed? No: EF normal  Was EF assessed during THIS hospitalization? No:  Was Cardiac Rehab II ordered? (Included Medically managed Patients): Yes   Outstanding Labs/Studies    Duration of Discharge Encounter   Greater than 30 minutes including physician time.  Signed, Arbutus Leas NP 06/06/2016, 10:31 AM

## 2016-06-06 NOTE — Progress Notes (Signed)
R Femerol sheath pulled. Small bruise noted. Level 1 Will continue to monitor. VSS.

## 2016-06-06 NOTE — Care Management Note (Signed)
Case Management Note  Patient Details  Name: Kathryn Garrett MRN: [id number] Date of Birth: 02-Jan-1946  Subjective/Objective:     S/p Coronary Balloon Angioplasty, will be on plavix, NCM will cont to follow for dc needs.                 Action/Plan:   Expected Discharge Date:                  Expected Discharge Plan:  Home/Self Care  In-House Referral:     Discharge planning Services  CM Consult  Post Acute Care Choice:    Choice offered to:     DME Arranged:    DME Agency:     HH Arranged:    HH Agency:     Status of Service:  In process, will continue to follow  If discussed at Long Length of Stay Meetings, dates discussed:    Additional Comments:  Zenon Mayo, RN 06/06/2016, 8:45 AM

## 2016-06-07 NOTE — Telephone Encounter (Signed)
Called patient.  Stated that she is doing well after cath.  Understands her med list. Is aware that she has an appt with Melina Copa on 06-15-16 @9 :59.  Didn't have any questions.

## 2016-06-12 ENCOUNTER — Encounter: Payer: Self-pay | Admitting: Physician Assistant

## 2016-06-12 NOTE — Progress Notes (Deleted)
Cardiology Office Note    Date:  06/12/2016  ID:  Kathryn Garrett, DOB 14-Feb-1946, MRN [id number] PCP:  Imagene Riches, NP  Cardiologist:  Dr. Irish Lack   Chief Complaint: f/u cath  History of Present Illness:  Kathryn Garrett is a 71 y.o. female with history of CAD (h/o VF arrest 01/2009 with inferoposterior MI s/p aspiration thrombectomy/BMS of RCA at that time, ISR of BMS s/p DES to RCA 06/2010, PTCA to ISR of prox RCA 01/2016, PTCA to ISR of prox RCA 05/2016), chronic combined CHF (EF 40-45% by cath 2010, subsequently normal since then), COPD, tobacco abuse, PVD (mild atherosclerosis of infrarenal aorta, 25% ostial left renal artery stenosis, 50% ostial right common iliac by cath 2010, >50% R subclavian 05/2016), GERD, prior narcotic abuse, pulm nodule, mild anemia who presents for f/u. She had complex last couple of years.  She was admitted 09/2015 with sepsis felt due to CAP/parapneumonic pleural effusion/empyema with cytology negative for malignancy, s/p PleurX catheter. CT Chest 01/13/16 without residual effusion, + post infectious scaring LLL, solid 18mm RUL pulm nodule c/w benign nodule, ground glass pulm nodules in both lungs favored to be related to interstitial lung disease, stable nonspecific mild mediastinal and left hilar adenopathy, COPD, aortic atherosclerosis/coronary atherosclerosis. In 01/2016 she underwent PCI of RCA due to in-stent restenosis. More recently in 05/2016 she was admitted with unstable angina with LHC resulting in aggressive angioplasty to ISR of the previously stented proximal RCA. Repeat FFR on ostial LAD confirmed this was not likely significant. It was recommended to continue aspirin and Plavix lifelong. Carotid duplex 05/2016: 1-39% BICA, >50% ECA, >50% right subclavian stenosis. Labs showed glucose 140, Cr 0.64, Hgb 11.7, LDL 57.  p2y12 advance lipitor if cmet OK a1C? Subclavian Cath site  CAD Chronic combined CHF Ongoing tobacco  abuse Hyperglycemia Hyperlipidemia PAD    Past Medical History:  Diagnosis Date  . Anemia, mild   . Arthritis    "qwhere" (02/10/2016)  . CAD (coronary artery disease)    a. VF arrest 01/2009/CAD with inferoposterior MI s/p aspiration thrombectomy/BMS of RCA at that time. b. ISR of BMS s/p DES to RCA 06/2010 with moderate LM/LAD disease not significant by FFR. c. s/p angiosculpt PTCA to prox RCA 01/2016 for ISR. d. Aggressive PTCA to prox RCA for ISR 05/2016.  . Cardiac arrest ([place]) 01/2009   a. in setting of inf-post STEMI 01/2009 (VF).  . Chronic bronchitis ([place])   . Chronic combined systolic and diastolic CHF (congestive heart failure) ([place])    a. EF 40-45% by cath 2010. b. 60-65% with grade 1 DD by echo 09/2015  . COPD (chronic obstructive pulmonary disease) ([place])   . Hyperlipidemia 01/28/2016  . Hypertension   . MI (myocardial infarction) 01/2009  . Narcotic abuse    pt now taking Suboxone tid  . Pneumonia 06/2014; 09/2015  . Pre-diabetes   . Pulmonary nodule   . PVD (peripheral vascular disease) (H[facility])    mild atherosclerosis of infrarenal aorta, 25% ostial left renal artery stenosis, 50% ostial right common iliac by cath 2010  . S/P PTCA (percutaneous transluminal coronary angioplasty) 02/10/16 to RCA lesion for in stent restenois 02/11/2016  . Subclavian artery stenosis (H[facility])    a. >50% by duplex 05/2016.  . Tobacco abuse     Past Surgical History:  Procedure Laterality Date  . ABDOMINAL HYSTERECTOMY    . ANKLE FRACTURE SURGERY Right 2000s  . CARDIAC CATHETERIZATION N/A 02/10/2016   Procedure: Left Heart Cath and Coronary  Angiography;  Surgeon: Jettie Booze, MD;  Location: [facility];  Service: Cardiovascular;  Laterality: N/A;  . CARDIAC CATHETERIZATION N/A 02/10/2016   Procedure: Coronary Balloon Angioplasty;  Surgeon: Jettie Booze, MD;  Location: [facility];  Service: Cardiovascular;  Laterality: N/A;  instent RCA  . CARDIAC CATHETERIZATION N/A  06/05/2016   Procedure: Left Heart Cath and Coronary Angiography;  Surgeon: Leonie Man, MD;  Location: [facility];  Service: Cardiovascular;  Laterality: N/A;  . CARDIAC CATHETERIZATION N/A 06/05/2016   Procedure: Coronary Balloon Angioplasty;  Surgeon: Leonie Man, MD;  Location: [facility];  Service: Cardiovascular;  Laterality: N/A;  . CHEST TUBE INSERTION N/A 10/08/2015   Procedure: PLEURX CATH REMOVAL;  Surgeon: [name], MD;  Location: [facility];  Service: Thoracic;  Laterality: N/A;  . CORONARY ANGIOPLASTY WITH STENT PLACEMENT  01/2009; 2012  . FRACTURE SURGERY    . VIDEO ASSISTED THORACOSCOPY (VATS)/THOROCOTOMY Left 09/29/2015   Procedure: PREOP BRONCHOSCOPY, LEFT THORACOSCOPY, POSSIBLE THORACOTOMY, PLEURAL BIOPSY, TALC;  Surgeon: [name], MD;  Location: [facility];  Service: General;  Laterality: Left;    Current Medications: Current Outpatient Prescriptions  Medication Sig Dispense Refill  . acetaminophen (TYLENOL) 500 MG tablet Take 1,000 mg by mouth every 6 (six) hours as needed for headache.    . albuterol (PROVENTIL HFA;VENTOLIN HFA) 108 (90 Base) MCG/ACT inhaler Inhale 2 puffs into the lungs every 4 (four) hours as needed for wheezing or shortness of breath.    Marland Kitchen aspirin EC 81 MG tablet Take 81 mg by mouth at bedtime.     Marland Kitchen atorvastatin (LIPITOR) 40 MG tablet Take 1 tablet (40 mg total) by mouth daily at 6 PM. 30 tablet 6  . Buprenorphine HCl-Naloxone HCl 8-2 MG FILM Place 1 Film under the tongue 3 (three) times daily.    . clopidogrel (PLAVIX) 75 MG tablet Take 1 tablet (75 mg total) by mouth daily. (Patient taking differently: Take 75 mg by mouth at bedtime. ) 90 tablet 3  . metoprolol tartrate (LOPRESSOR) 25 MG tablet Take 1 tablet (25 mg total) by mouth 2 (two) times daily. 180 tablet 3  . nitroGLYCERIN (NITROSTAT) 0.4 MG SL tablet Place 1 tablet (0.4 mg total) under the tongue every 5 (five) minutes as needed for chest pain. 25 tablet 4   No  current facility-administered medications for this visit.      Allergies:   Azithromycin   Social History   Social History  . Marital status: Legally Separated    Spouse name: N/A  . Number of children: N/A  . Years of education: N/A   Social History Main Topics  . Smoking status: Current Every Day Smoker    Packs/day: 0.50    Years: 56.00    Types: Cigarettes  . Smokeless tobacco: Never Used  . Alcohol use No  . Drug use: No  . Sexual activity: Not Currently   Other Topics Concern  . Not on file   Social History Narrative  . No narrative on file     Family History:  The patient's family history includes Cancer in her brother; Coronary artery disease in her father and mother. ***  ROS:   Please see the history of present illness. Otherwise, review of systems is positive for ***.  All other systems are reviewed and otherwise negative.    PHYSICAL EXAM:   VS:  There were no vitals taken for this visit.  BMI: There is no  height or weight on file to calculate BMI. GEN: Well nourished, well developed, in no acute distress  HEENT: normocephalic, atraumatic Neck: no JVD, carotid bruits, or masses Cardiac: ***RRR; no murmurs, rubs, or gallops, no edema  Respiratory:  clear to auscultation bilaterally, normal work of breathing GI: soft, nontender, nondistended, + BS MS: no deformity or atrophy  Skin: warm and dry, no rash Neuro:  Alert and Oriented x 3, Strength and sensation are intact, follows commands Psych: euthymic mood, full affect  Wt Readings from Last 3 Encounters:  06/06/16 127 lb 13.9 oz (58 kg)  03/13/16 132 lb (59.9 kg)  02/11/16 136 lb 11 oz (62 kg)      Studies/Labs Reviewed:   EKG:  EKG was ordered today and personally reviewed by me and demonstrates *** EKG was not ordered today.***  Recent Labs: 09/24/2015: B Natriuretic Peptide 30.0 09/28/2015: ALT 11 06/06/2016: BUN 10; Creatinine, Ser 0.64; Hemoglobin 11.7; Platelets 229; Potassium 3.9; Sodium  137   Lipid Panel    Component Value Date/Time   CHOL 113 06/05/2016 0911   CHOL 85 03/20/2013 0557   TRIG 120 06/05/2016 0911   TRIG 112 03/20/2013 0557   HDL 32 (L) 06/05/2016 0911   HDL 21 (L) 03/20/2013 0557   CHOLHDL 3.5 06/05/2016 0911   VLDL 24 06/05/2016 0911   VLDL 22 03/20/2013 0557   LDLCALC 57 06/05/2016 0911   LDLCALC 42 03/20/2013 0557    Additional studies/ records that were reviewed today include: Summarized above.***    ASSESSMENT & PLAN:   1. ***  Disposition: F/u with ***   Medication Adjustments/Labs and Tests Ordered: Current medicines are reviewed at length with the patient today.  Concerns regarding medicines are outlined above. Medication changes, Labs and Tests ordered today are summarized above and listed in the Patient Instructions accessible in Encounters.   Raechel Ache PA-C  06/12/2016 4:54 PM    [facility] [place], [place], [place]  [address] Phone: [phone]; Fax: [phone]

## 2016-06-15 ENCOUNTER — Encounter: Payer: Medicare HMO | Admitting: Physician Assistant

## 2016-06-16 ENCOUNTER — Encounter: Payer: Medicare HMO | Admitting: Cardiology

## 2016-06-16 ENCOUNTER — Telehealth: Payer: Self-pay

## 2016-06-16 NOTE — Telephone Encounter (Signed)
The pt is scheduled to have cataract extractions w/intraocular lens implantation of the left eye followed by the right eye on 07/10/16. They are requesting: 1. Cardiac clearance. 2. The pt is taking Asa 81 mg and Plavix 75 mg daily.  Please advise.

## 2016-06-16 NOTE — Telephone Encounter (Signed)
Antiplatelet and cardiac clearance are per MD recommendation.

## 2016-06-20 ENCOUNTER — Encounter (INDEPENDENT_AMBULATORY_CARE_PROVIDER_SITE_OTHER): Payer: Self-pay

## 2016-06-20 ENCOUNTER — Ambulatory Visit (INDEPENDENT_AMBULATORY_CARE_PROVIDER_SITE_OTHER): Payer: Medicare HMO | Admitting: Cardiology

## 2016-06-20 ENCOUNTER — Encounter: Payer: Self-pay | Admitting: Cardiology

## 2016-06-20 VITALS — BP 136/44 | HR 94 | Ht 59.0 in | Wt 129.4 lb

## 2016-06-20 DIAGNOSIS — I739 Peripheral vascular disease, unspecified: Secondary | ICD-10-CM

## 2016-06-20 NOTE — Telephone Encounter (Signed)
Continue aspirin and Plavix.  No further cardiac testing needed before surgery.  Please cc her ophthalmologist.

## 2016-06-20 NOTE — Progress Notes (Signed)
06/20/2016 Kathryn Garrett   09-22-1945  [id number]  Primary Physician Imagene Riches, NP Primary Cardiologist: Dr. Irish Lack    Reason for Visit/CC: [facility] f/u for CAD   HPI:  71 y.o.femalewith history of HTN, HL, COPD with ongoing tobacco use, CAD status post inferior MI treated with bare-metal stent to the RCA in 2010, status post DES to the proximal to mid RCA 06/2010 for instent restenosis of the bare-metal stent.She had moderate left main/LAD disease which was not significant.  She was admitted in 9/17 for [place]. Cath with ISR of RCA for which she  underwent balloon angioplasty of RCA with ISR 75%->0%. Plavix was restarted.   She presented [facility] to [facility] on 06[facility]16 with complaint of recurrent CP c/w unstable angina. Her CP was relieved with NTG. ECG with mild ST depression in inferior leads. 1st troponin was normal. Given her history and symptomatology, she was referred for LHC. She was found to have recurrent in-stent restenosis within the proximal RCA, treated with PTCA with scoring balloon. Repeat FFR on ostial LAD confirms this is not likely significant. She was continued on DAPT with ASA + Plavix and metoprolol. Smoking cessation was advised.   Of note, she was also found to have a right carotid bruit on exam. Subsequent doppler study showed bilateral ECA stenosis >50% but no significant ICA.   She presents back to clinic for post hospital f/u. She reports that she has done well w/o recurrent anginal symptoms. No post cath complications. She reports full medication compliance. Unfortunately she continues to smoke but is making efforts to cut back. She has an Rx for chantix but is afraid to try it. She did not do well with patches in the past.   She also notes symptoms of claudication. She notes pain in both lower extremities with exercise that improve with rest. Her feet also stay cold, per patient report.   Current Meds  Medication Sig  . acetaminophen (TYLENOL) 500 MG  tablet Take 1,000 mg by mouth every 6 (six) hours as needed for headache.  . albuterol (PROVENTIL HFA;VENTOLIN HFA) 108 (90 Base) MCG/ACT inhaler Inhale 2 puffs into the lungs every 4 (four) hours as needed for wheezing or shortness of breath.  Marland Kitchen aspirin EC 81 MG tablet Take 81 mg by mouth at bedtime.   Marland Kitchen atorvastatin (LIPITOR) 40 MG tablet Take 1 tablet (40 mg total) by mouth daily at 6 PM.  . Buprenorphine HCl-Naloxone HCl 8-2 MG FILM Place 1 Film under the tongue 3 (three) times daily.  . clopidogrel (PLAVIX) 75 MG tablet Take 1 tablet (75 mg total) by mouth daily. (Patient taking differently: Take 75 mg by mouth at bedtime. )  . metoprolol tartrate (LOPRESSOR) 25 MG tablet Take 1 tablet (25 mg total) by mouth 2 (two) times daily.  . nitroGLYCERIN (NITROSTAT) 0.4 MG SL tablet Place 1 tablet (0.4 mg total) under the tongue every 5 (five) minutes as needed for chest pain.   Allergies  Allergen Reactions  . Azithromycin Rash   Past Medical History:  Diagnosis Date  . Anemia, mild   . Arthritis    "qwhere" (02/10/2016)  . CAD (coronary artery disease)    a. VF arrest 01/2009/CAD with inferoposterior MI s/p aspiration thrombectomy/BMS of RCA at that time. b. ISR of BMS s/p DES to RCA 06/2010 with moderate LM/LAD disease not significant by FFR. c. s/p angiosculpt PTCA to prox RCA 01/2016 for ISR. d. Aggressive PTCA to prox RCA for ISR 05/2016.  Marland Kitchen  Cardiac arrest ([place]) 01/2009   a. in setting of inf-post STEMI 01/2009 (VF).  . Chronic bronchitis ([place])   . Chronic combined systolic and diastolic CHF (congestive heart failure) ([place])    a. EF 40-45% by cath 2010. b. 60-65% with grade 1 DD by echo 09/2015  . COPD (chronic obstructive pulmonary disease) ([place])   . Hyperlipidemia 01/28/2016  . Hypertension   . MI (myocardial infarction) 01/2009  . Narcotic abuse    pt now taking Suboxone tid  . Pneumonia 06/2014; 09/2015  . Pre-diabetes   . Pulmonary nodule   . PVD (peripheral vascular disease) (H[facility])     mild atherosclerosis of infrarenal aorta, 25% ostial left renal artery stenosis, 50% ostial right common iliac by cath 2010  . S/P PTCA (percutaneous transluminal coronary angioplasty) 02/10/16 to RCA lesion for in stent restenois 02/11/2016  . Subclavian artery stenosis (H[facility])    a. >50% by duplex 05/2016.  . Tobacco abuse    Family History  Problem Relation Age of Onset  . Coronary artery disease Father   . Coronary artery disease Mother   . Cancer Brother    Past Surgical History:  Procedure Laterality Date  . ABDOMINAL HYSTERECTOMY    . ANKLE FRACTURE SURGERY Right 2000s  . CARDIAC CATHETERIZATION N/A 02/10/2016   Procedure: Left Heart Cath and Coronary Angiography;  Surgeon: Jettie Booze, MD;  Location: [place] [facility];  Service: Cardiovascular;  Laterality: N/A;  . CARDIAC CATHETERIZATION N/A 02/10/2016   Procedure: Coronary Balloon Angioplasty;  Surgeon: Jettie Booze, MD;  Location: [place] [facility];  Service: Cardiovascular;  Laterality: N/A;  instent RCA  . CARDIAC CATHETERIZATION N/A 06/05/2016   Procedure: Left Heart Cath and Coronary Angiography;  Surgeon: Leonie Man, MD;  Location: [place] [facility];  Service: Cardiovascular;  Laterality: N/A;  . CARDIAC CATHETERIZATION N/A 06/05/2016   Procedure: Coronary Balloon Angioplasty;  Surgeon: Leonie Man, MD;  Location: [place] [facility];  Service: Cardiovascular;  Laterality: N/A;  . CHEST TUBE INSERTION N/A 10/08/2015   Procedure: PLEURX CATH REMOVAL;  Surgeon: [name], MD;  Location: [facility];  Service: Thoracic;  Laterality: N/A;  . CORONARY ANGIOPLASTY WITH STENT PLACEMENT  01/2009; 2012  . FRACTURE SURGERY    . VIDEO ASSISTED THORACOSCOPY (VATS)/THOROCOTOMY Left 09/29/2015   Procedure: PREOP BRONCHOSCOPY, LEFT THORACOSCOPY, POSSIBLE THORACOTOMY, PLEURAL BIOPSY, TALC;  Surgeon: [name], MD;  Location: [facility];  Service: General;  Laterality: Left;   Social History   Social History  . Marital  status: Legally Separated    Spouse name: N/A  . Number of children: N/A  . Years of education: N/A   Occupational History  . Not on file.   Social History Main Topics  . Smoking status: Current Every Day Smoker    Packs/day: 0.50    Years: 56.00    Types: Cigarettes  . Smokeless tobacco: Never Used  . Alcohol use No  . Drug use: No  . Sexual activity: Not Currently   Other Topics Concern  . Not on file   Social History Narrative  . No narrative on file     Review of Systems: General: negative for chills, fever, night sweats or weight changes.  Cardiovascular: negative for chest pain, dyspnea on exertion, edema, orthopnea, palpitations, paroxysmal nocturnal dyspnea or shortness of breath Dermatological: negative for rash Respiratory: negative for cough or wheezing Urologic: negative for hematuria Abdominal: negative for nausea, vomiting, diarrhea, bright red blood per rectum, melena,  or hematemesis Neurologic: negative for visual changes, syncope, or dizziness All other systems reviewed and are otherwise negative except as noted above.   Physical Exam:  Blood pressure (!) 136/44, pulse 94, height 4\' 11"  (1.499 m), weight 129 lb 6.4 oz (58.7 kg), SpO2 96 %.  General appearance: alert, cooperative and no distress Neck: no carotid bruit and no JVD Lungs: clear to auscultation bilaterally Heart: regular rate and rhythm, S1, S2 normal, no murmur, click, rub or gallop Extremities: extremities normal, atraumatic, no cyanosis or edema Pulses: 2+ and symmetric Skin: Skin color, texture, turgor normal. No rashes or lesions Neurologic: Grossly normal  EKG not performed   ASSESSMENT AND PLAN:   1. CAD: as outlined above, multiple PCIs in the past, most recent PTCA for RCA stent restenosis. Stable w/o recurrent angina. Continue DAPT with ASA + Plavix, statin therapy + Beta blocker. We dicussed the importance of compete smoking cessation to reduce risk of worsening CAD/ recurrent  ISR.  2. Claudication: she has decreased DPs bilaterally, also known CAD and ongoing tobacco abuse. We will screen for PVD with bilateral LE arterial dopplers. If abnormal, we will refer to Dr. Gwenlyn Found or Dr. Fletcher Anon.   3. Tobacco Abuse: long discussion held regarding need for complete smoking cessation.   4. HTN: BP is controlled.  5. HLD: on statin therapy with Lipitor. LDL at goal at 57 mg/dL.   PLAN  F/u with Dr. Irish Lack in 3 months.   Vandy Tsuchiya PA-C 06/20/2016 10:04 AM

## 2016-06-20 NOTE — Telephone Encounter (Signed)
Will fax to [facility].

## 2016-06-20 NOTE — Patient Instructions (Addendum)
Medication Instructions:   Your physician recommends that you continue on your current medications as directed. Please refer to the Current Medication list given to you today.   If you need a refill on your cardiac medications before your next appointment, please call your pharmacy.  Labwork: NONE ORDERED  TODAY    Testing/Procedures: Your physician has requested that you have a lower extremity arterial exercise duplex. During this test, exercise and ultrasound are used to evaluate arterial blood flow in the legs. Allow one hour for this exam. There are no restrictions or special instructions.    Follow-Up: 3 MONTHS WITH DR Irish Lack   Any Other Special Instructions Will Be Listed Below (If Applicable).   Call [place] Telephone Service is available 24/7 toll-free at 1-800-QUIT-NOW [phone]).  Interpretation services available for many languages.  Spanish: 1-855-Dejelo-Ya [phone])  [id number]  OR save on time and phone minutes by registering online.   Steps to Quit Smoking Smoking tobacco can be bad for your health. It can also affect almost every organ in your body. Smoking puts you and people around you at risk for many serious long-lasting (chronic) diseases. Quitting smoking is hard, but it is one of the best things that you can do for your health. It is never too late to quit. What are the benefits of quitting smoking? When you quit smoking, you lower your risk for getting serious diseases and conditions. They can include:  Lung cancer or lung disease.  Heart disease.  Stroke.  Heart attack.  Not being able to have children (infertility).  Weak bones (osteoporosis) and broken bones (fractures). If you have coughing, wheezing, and shortness of breath, those symptoms may get better when you quit. You may also get sick less often. If you are pregnant, quitting smoking can help to lower your chances of having a baby of low birth weight. What can  I do to help me quit smoking? Talk with your doctor about what can help you quit smoking. Some things you can do (strategies) include:  Quitting smoking totally, instead of slowly cutting back how much you smoke over a period of time.  Going to in-person counseling. You are more likely to quit if you go to many counseling sessions.  Using resources and support systems, such as:  Online chats with a Social worker.  Phone quitlines.  Printed Furniture conservator/restorer.  Support groups or group counseling.  Text messaging programs.  Mobile phone apps or applications.  Taking medicines. Some of these medicines may have nicotine in them. If you are pregnant or breastfeeding, do not take any medicines to quit smoking unless your doctor says it is okay. Talk with your doctor about counseling or other things that can help you. Talk with your doctor about using more than one strategy at the same time, such as taking medicines while you are also going to in-person counseling. This can help make quitting easier. What things can I do to make it easier to quit? Quitting smoking might feel very hard at first, but there is a lot that you can do to make it easier. Take these steps:  Talk to your family and friends. Ask them to support and encourage you.  Call phone quitlines, reach out to support groups, or work with a Social worker.  Ask people who smoke to not smoke around you.  Avoid places that make you want (trigger) to smoke, such as:  Bars.  Parties.  Smoke-break areas at work.  Spend time with people who do  not smoke.  Lower the stress in your life. Stress can make you want to smoke. Try these things to help your stress:  Getting regular exercise.  Deep-breathing exercises.  Yoga.  Meditating.  Doing a body scan. To do this, close your eyes, focus on one area of your body at a time from head to toe, and notice which parts of your body are tense. Try to relax the muscles in those  areas.  Download or buy apps on your mobile phone or tablet that can help you stick to your quit plan. There are many free apps, such as QuitGuide from the [employer] Office manager for Disease Control and Prevention). You can find more support from smokefree.gov and other websites. This information is not intended to replace advice given to you by your health care provider. Make sure you discuss any questions you have with your health care provider. Document Released: 02/25/2009 Document Revised: 12/28/2015 Document Reviewed: 09/15/2014 Elsevier Interactive Patient Education  2017 [employer].

## 2016-06-26 ENCOUNTER — Encounter: Payer: Medicare HMO | Attending: Interventional Cardiology

## 2016-06-26 IMAGING — US US ABDOMEN COMPLETE
1 series · 14 of 25 positions shown · non-contrast
Comparison: None.

CLINICAL DATA: Left upper quadrant abdominal pain. Shortness of
breath. Clinical concern for ascites.

EXAM:
ABDOMEN ULTRASOUND COMPLETE

[Series 1: us abdomen complete · 0.23mm/px · 14 of 92 slices shown]
[im 1/92]
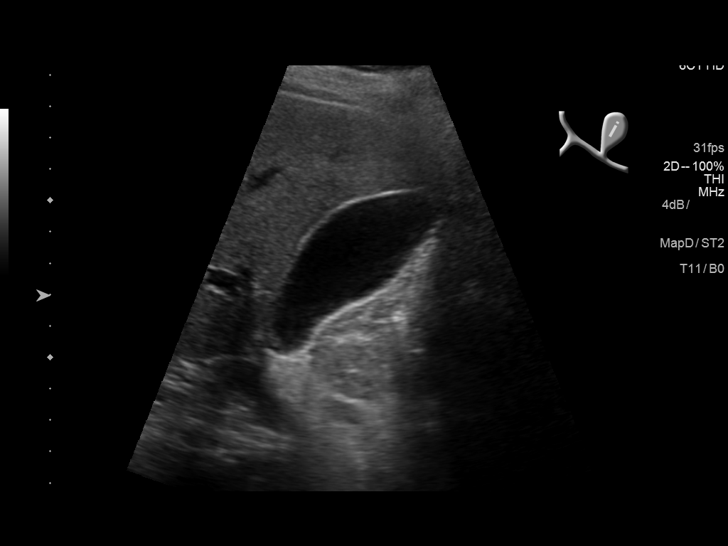
[im 8/92]
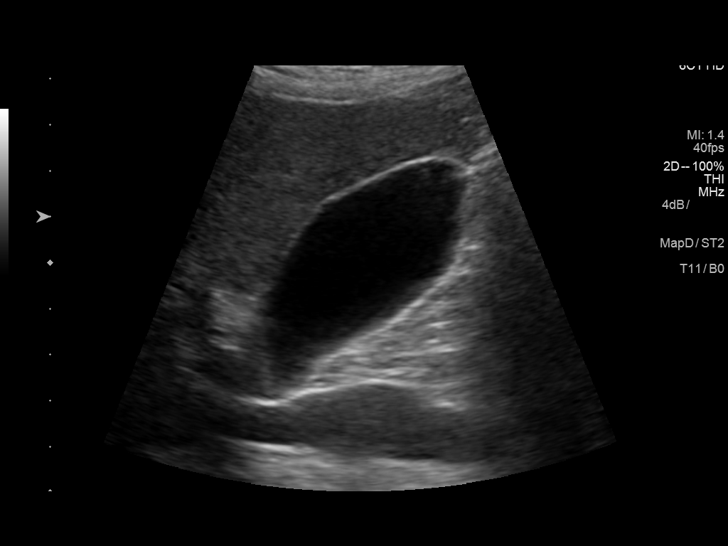
[im 16/92]
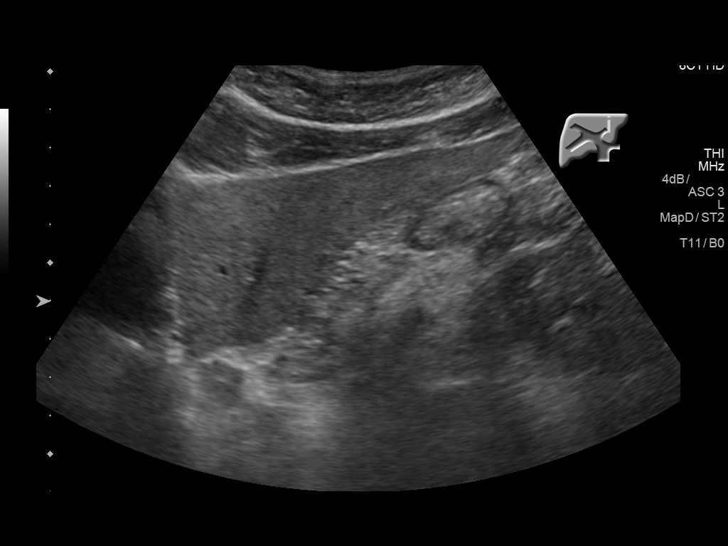
[im 23/92]
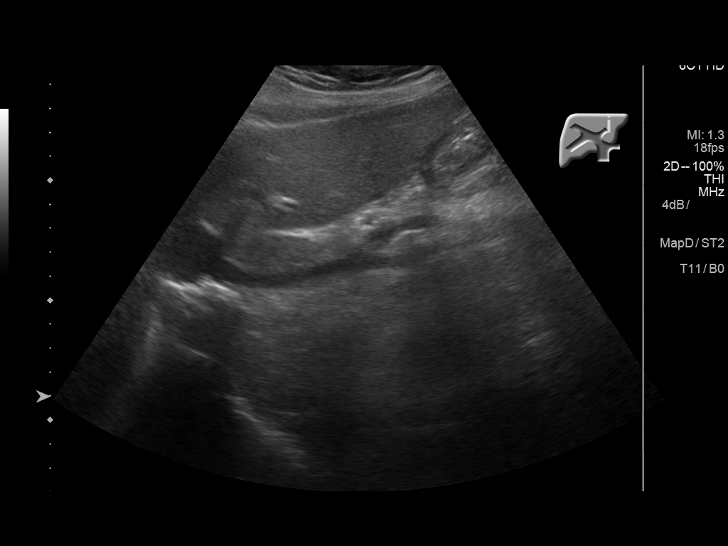
[im 31/92]
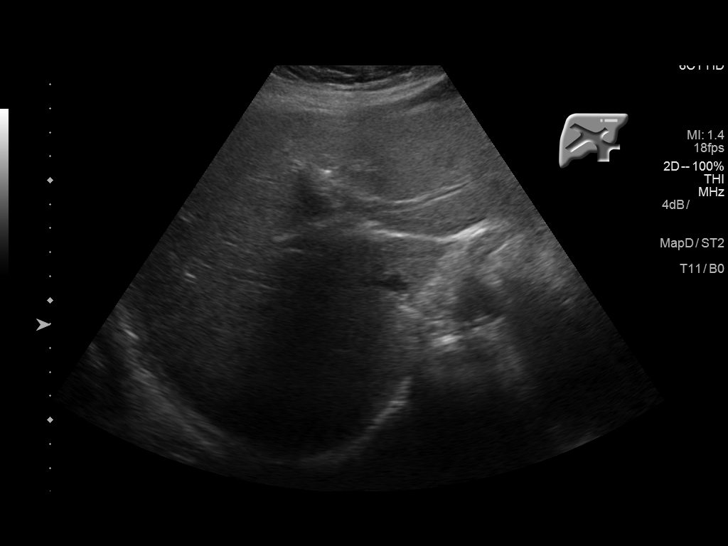
[im 35/92]
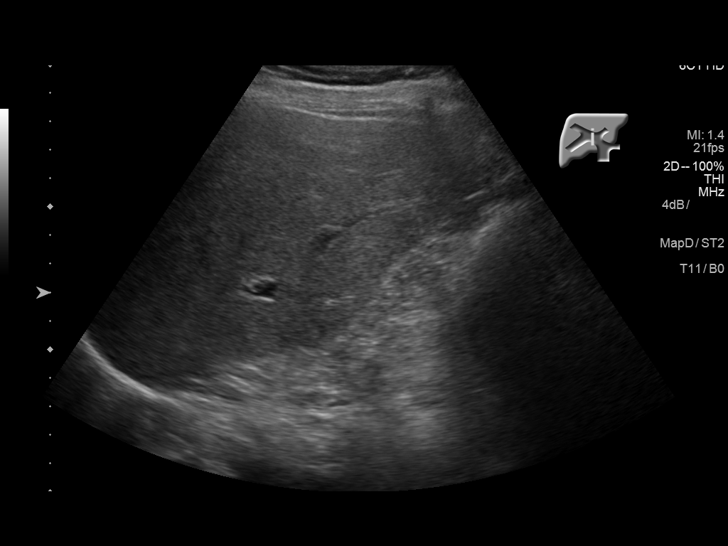
[im 42/92]
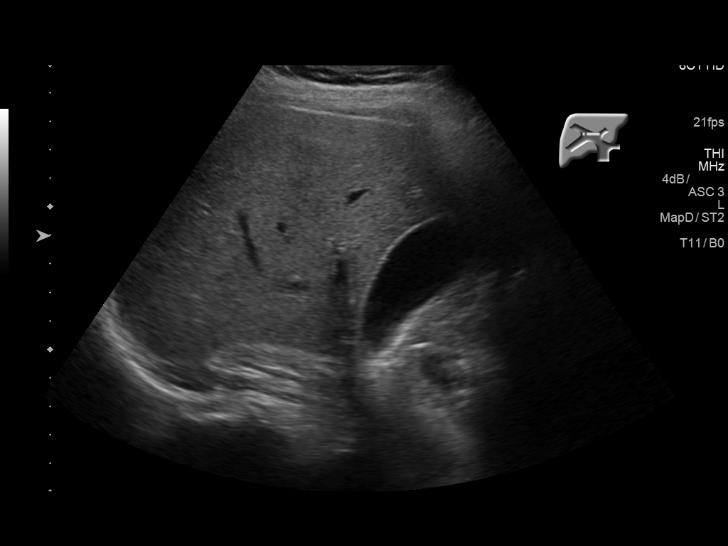
[im 50/92]
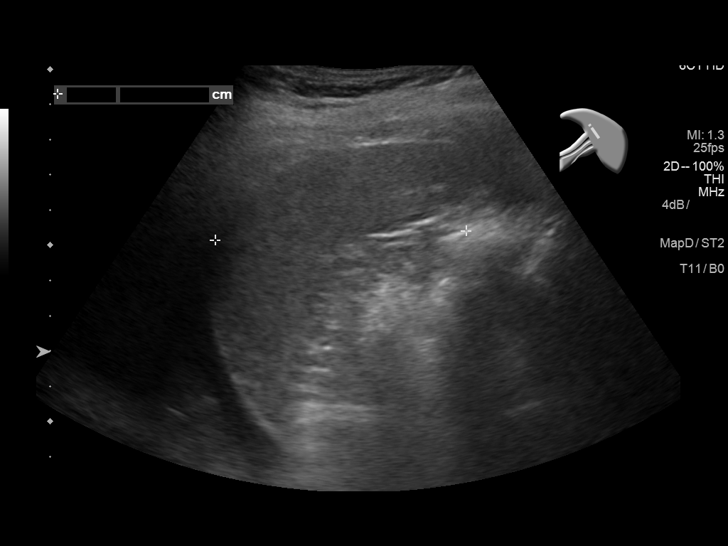
[im 57/92]
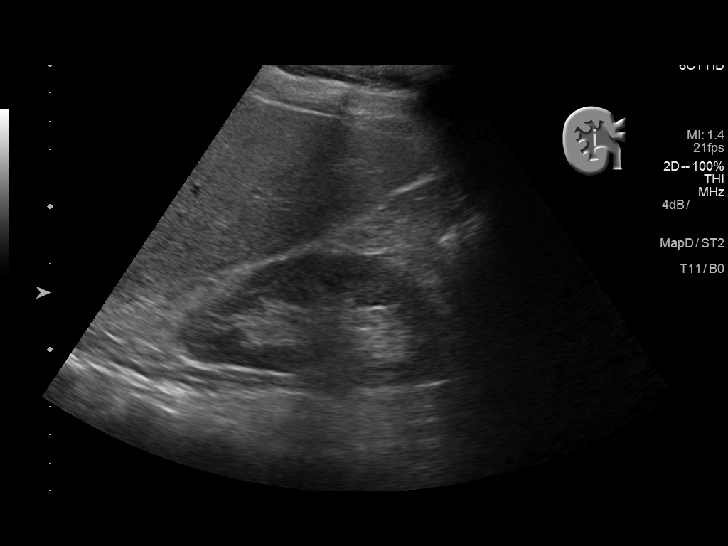
[im 61/92]
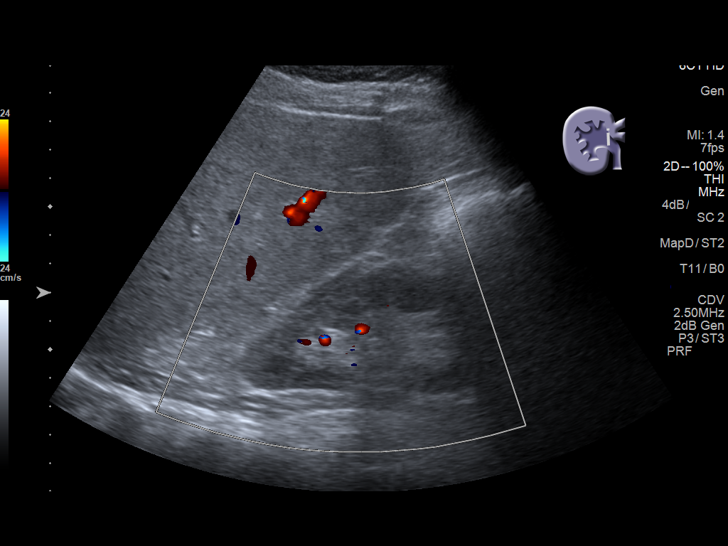
[im 69/92]
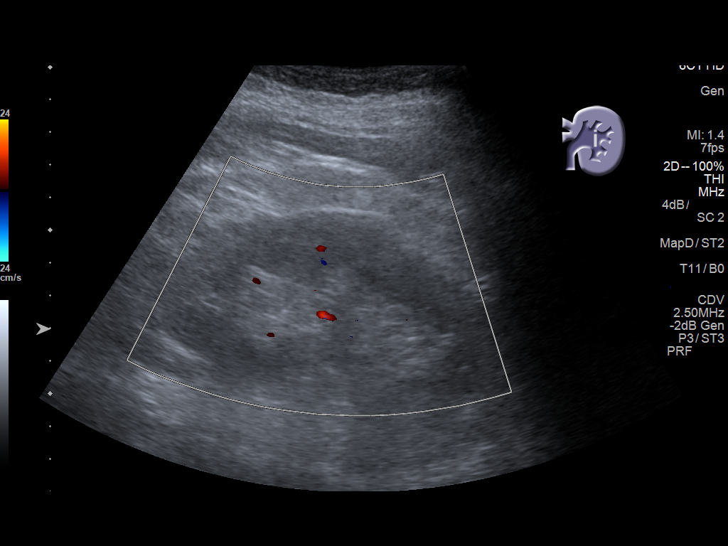
[im 76/92]
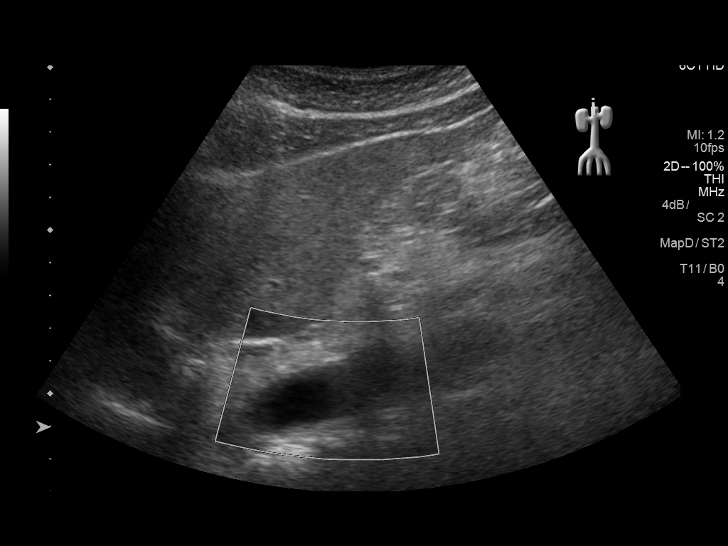
[im 84/92]
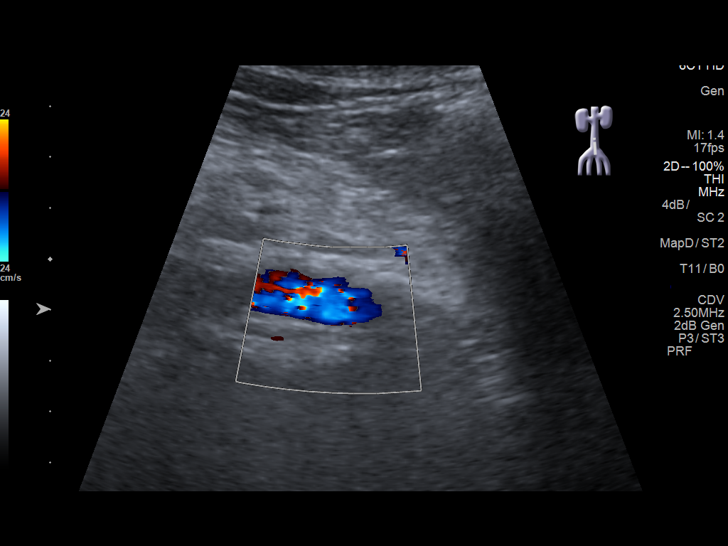
[im 92/92]
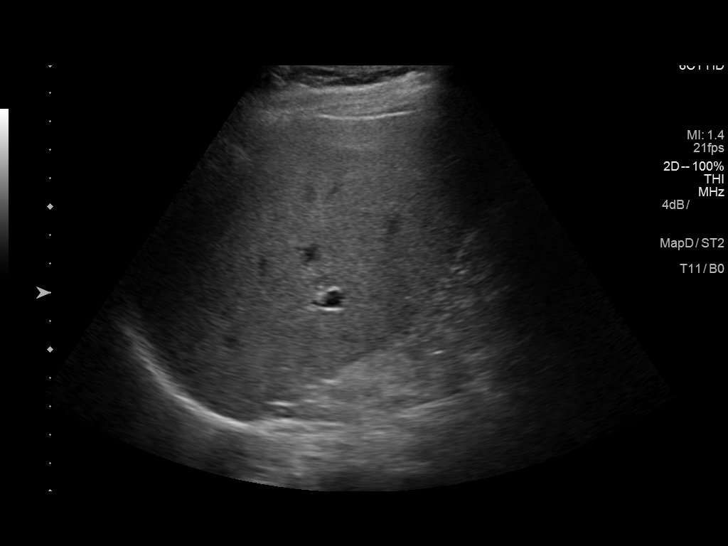

[14 of 25 positions shown; findings below may reference images not displayed]

FINDINGS: Gallbladder: No gallstones or wall thickening visualized. No
sonographic Murphy sign noted by sonographer.

Common bile duct: Diameter: 5 mm

Liver: Liver parenchymal echogenicity is at the upper limits of
normal. Liver parenchymal echotexture is within normal limits. No
definite liver surface irregularity. No liver mass demonstrated.

IVC: No abnormality visualized.

Pancreas: Visualized portion unremarkable.

Spleen: Size and appearance within normal limits.

Right Kidney: Length: 10.6 cm. Echogenicity within normal limits. No
mass or hydronephrosis visualized.

Left Kidney: Length: 11.0 cm. Echogenicity within normal limits. No
mass or hydronephrosis visualized.

Abdominal aorta: No aneurysm visualized.

Other findings: Left pleural effusion is incidentally noted. No
ascites.
IMPRESSION: 1. Normal abdominal sonogram. No ascites. Liver appears normal.
Normal size spleen.
2. Incidental left pleural effusion.

## 2016-06-28 DIAGNOSIS — R69 Illness, unspecified: Secondary | ICD-10-CM | POA: Diagnosis not present

## 2016-06-30 ENCOUNTER — Other Ambulatory Visit: Payer: Self-pay | Admitting: Cardiology

## 2016-06-30 DIAGNOSIS — I739 Peripheral vascular disease, unspecified: Secondary | ICD-10-CM

## 2016-07-05 ENCOUNTER — Inpatient Hospital Stay (HOSPITAL_COMMUNITY): Admission: RE | Admit: 2016-07-05 | Payer: Medicare HMO | Source: Ambulatory Visit

## 2016-07-10 DIAGNOSIS — H2512 Age-related nuclear cataract, left eye: Secondary | ICD-10-CM | POA: Diagnosis not present

## 2016-07-10 DIAGNOSIS — Z961 Presence of intraocular lens: Secondary | ICD-10-CM | POA: Diagnosis not present

## 2016-07-11 DIAGNOSIS — H2511 Age-related nuclear cataract, right eye: Secondary | ICD-10-CM | POA: Diagnosis not present

## 2016-07-24 DIAGNOSIS — H2511 Age-related nuclear cataract, right eye: Secondary | ICD-10-CM | POA: Diagnosis not present

## 2016-07-24 DIAGNOSIS — Z961 Presence of intraocular lens: Secondary | ICD-10-CM | POA: Diagnosis not present

## 2016-07-25 DIAGNOSIS — R69 Illness, unspecified: Secondary | ICD-10-CM | POA: Diagnosis not present

## 2016-08-07 ENCOUNTER — Ambulatory Visit (HOSPITAL_COMMUNITY)
Admission: RE | Admit: 2016-08-07 | Discharge: 2016-08-07 | Disposition: A | Discharge status: Home / Self Care | Payer: Medicare HMO | Source: Ambulatory Visit | Attending: Cardiology | Admitting: Cardiology

## 2016-08-07 DIAGNOSIS — I739 Peripheral vascular disease, unspecified: Secondary | ICD-10-CM

## 2016-08-07 DIAGNOSIS — R0989 Other specified symptoms and signs involving the circulatory and respiratory systems: Secondary | ICD-10-CM | POA: Diagnosis not present

## 2016-08-22 DIAGNOSIS — R69 Illness, unspecified: Secondary | ICD-10-CM | POA: Diagnosis not present

## 2016-08-30 ENCOUNTER — Ambulatory Visit (INDEPENDENT_AMBULATORY_CARE_PROVIDER_SITE_OTHER): Payer: Medicare HMO | Admitting: Cardiovascular Disease

## 2016-08-30 ENCOUNTER — Encounter: Payer: Self-pay | Admitting: Cardiovascular Disease

## 2016-08-30 DIAGNOSIS — I739 Peripheral vascular disease, unspecified: Secondary | ICD-10-CM | POA: Insufficient documentation

## 2016-08-30 NOTE — Progress Notes (Signed)
08/30/2016 Kathryn Garrett   [birth year]  [id number]  Primary Physician Kathryn Riches, NP Primary Cardiologist: Kathryn Harp MD Kathryn Garrett  HPI:  Kathryn Garrett is a 71 year old thin and frail-appearing Caucasian female referred by Kathryn Garrett per peripheral vascular evaluation. She has a history of ongoing tobacco abuse, hyperlipidemia and hypertension. She had cath in 2010 bare-metal stenting to her RCA. She had DES stent to her mid RCA February 2012 for "in-stent restenosis of the bare metal stent. That time showed moderate left main/LAD disease. She was noted to have a 50% ostial right common iliac artery stenosis at the time of her cath in 2010. She recently underwent repeat cath by Kathryn Garrett 06/05/16 revealing progressive "in-stent restenosis within the RCA and had aggressive wound dilatation of this. She has nocturnal cramps but really denies lifestyle limiting claudication. Recent Dopplers from 08/07/16 revealed right ABI 0.75 and left of 0.94. Her iliac arteries were not well visualized.   Current Outpatient Prescriptions  Medication Sig Dispense Refill  . acetaminophen (TYLENOL) 500 MG tablet Take 1,000 mg by mouth every 6 (six) hours as needed for headache.    . albuterol (PROVENTIL HFA;VENTOLIN HFA) 108 (90 Base) MCG/ACT inhaler Inhale 2 puffs into the lungs every 4 (four) hours as needed for wheezing or shortness of breath.    Marland Kitchen aspirin EC 81 MG tablet Take 81 mg by mouth at bedtime.     Marland Kitchen atorvastatin (LIPITOR) 40 MG tablet Take 1 tablet (40 mg total) by mouth daily at 6 PM. 30 tablet 6  . Buprenorphine HCl-Naloxone HCl 8-2 MG FILM Place 1 Film under the tongue 3 (three) times daily.    . clopidogrel (PLAVIX) 75 MG tablet Take 1 tablet (75 mg total) by mouth daily. (Patient taking differently: Take 75 mg by mouth at bedtime. ) 90 tablet 3  . metoprolol tartrate (LOPRESSOR) 25 MG tablet Take 1 tablet (25 mg total) by mouth 2 (two) times daily. 180 tablet 3  .  nitroGLYCERIN (NITROSTAT) 0.4 MG SL tablet Place 1 tablet (0.4 mg total) under the tongue every 5 (five) minutes as needed for chest pain. 25 tablet 4   No current facility-administered medications for this visit.     Allergies  Allergen Reactions  . Azithromycin Rash    Social History   Social History  . Marital status: Legally Separated    Spouse name: N/A  . Number of children: N/A  . Years of education: N/A   Occupational History  . Not on file.   Social History Main Topics  . Smoking status: Current Every Day Smoker    Packs/day: 0.50    Years: 56.00    Types: Cigarettes  . Smokeless tobacco: Never Used  . Alcohol use No  . Drug use: No  . Sexual activity: Not Currently   Other Topics Concern  . Not on file   Social History Narrative  . No narrative on file     Review of Systems: General: negative for chills, fever, night sweats or weight changes.  Cardiovascular: negative for chest pain, dyspnea on exertion, edema, orthopnea, palpitations, paroxysmal nocturnal dyspnea or shortness of breath Dermatological: negative for rash Respiratory: negative for cough or wheezing Urologic: negative for hematuria Abdominal: negative for nausea, vomiting, diarrhea, bright red blood per rectum, melena, or hematemesis Neurologic: negative for visual changes, syncope, or dizziness All other systems reviewed and are otherwise negative except as noted above.    Blood pressure (!) 129/56, pulse  81, height 4\' 11"  (1.499 m), weight 132 lb (59.9 kg), SpO2 94 %.  General appearance: alert and no distress Neck: no adenopathy, no JVD, supple, symmetrical, trachea midline, thyroid not enlarged, symmetric, no tenderness/mass/nodules and Right carotid and subclavian bruit Lungs: clear to auscultation bilaterally Heart: regular rate and rhythm, S1, S2 normal, no murmur, click, rub or gallop Extremities: 2+ femorals with soft bruits bilaterally  EKG not performed today  ASSESSMENT  AND PLAN:   Peripheral arterial disease (Kathryn Garrett) Kathryn Garrett was referred by Kathryn Garrett for peripheral vascular evaluation. She does have a history of coronary artery disease status post RCA stenting in the past with re-intervention by Kathryn Garrett 06/03/16 because of progressive "in-stent restenosis. She does continue to smoke and has a history of hyperlipidemia and hypertension. She really denies claudication but has nocturnal cramps. The cath in 2010 was noted she had a 50% ostial right common iliac artery stenosis. Dopplers performed in our office 08/07/16 revealed a right ABI 0.75 a left of .94. There is no obvious obstruction noted although no comment was made of the iliac system. She has bilateral femoral bruits on exam. At this point, given the Garrett of a developing claudication I would not pursue this further.      Kathryn Harp MD FACP,FACC,FAHA, [facility] 08/30/2016 4:50 PM

## 2016-08-30 NOTE — Assessment & Plan Note (Signed)
Kathryn Garrett was referred by Dr. Irish Lack for peripheral vascular evaluation. She does have a history of coronary artery disease status post RCA stenting in the past with re-intervention by Dr. Ellyn Hack 06/03/16 because of progressive "in-stent restenosis. She does continue to smoke and has a history of hyperlipidemia and hypertension. She really denies claudication but has nocturnal cramps. The cath in 2010 was noted she had a 50% ostial right common iliac artery stenosis. Dopplers performed in our office 08/07/16 revealed a right ABI 0.75 a left of .94. There is no obvious obstruction noted although no comment was made of the iliac system. She has bilateral femoral bruits on exam. At this point, given the lack of a developing claudication I would not pursue this further.

## 2016-08-30 NOTE — Patient Instructions (Signed)
Medication Instructions: Your physician recommends that you continue on your current medications as directed. Please refer to the Current Medication list given to you today.   Follow-Up: Your physician recommends that you schedule a follow-up appointment as needed with Dr. Berry.    

## 2016-08-31 ENCOUNTER — Encounter: Payer: Self-pay | Admitting: Interventional Cardiology

## 2016-09-20 ENCOUNTER — Ambulatory Visit: Payer: Medicare HMO | Admitting: Interventional Cardiology

## 2016-09-21 ENCOUNTER — Encounter: Payer: Self-pay | Admitting: Interventional Cardiology

## 2016-09-22 DIAGNOSIS — R69 Illness, unspecified: Secondary | ICD-10-CM | POA: Diagnosis not present

## 2016-10-17 ENCOUNTER — Other Ambulatory Visit: Payer: Self-pay | Admitting: Cardiology

## 2016-10-20 DIAGNOSIS — M654 Radial styloid tenosynovitis [de Quervain]: Secondary | ICD-10-CM | POA: Diagnosis not present

## 2016-10-23 DIAGNOSIS — R69 Illness, unspecified: Secondary | ICD-10-CM | POA: Diagnosis not present

## 2016-11-21 DIAGNOSIS — Z634 Disappearance and death of family member: Secondary | ICD-10-CM | POA: Diagnosis not present

## 2016-11-21 DIAGNOSIS — R69 Illness, unspecified: Secondary | ICD-10-CM | POA: Diagnosis not present

## 2016-12-22 DIAGNOSIS — R69 Illness, unspecified: Secondary | ICD-10-CM | POA: Diagnosis not present

## 2016-12-31 DIAGNOSIS — J209 Acute bronchitis, unspecified: Secondary | ICD-10-CM | POA: Diagnosis not present

## 2016-12-31 DIAGNOSIS — R062 Wheezing: Secondary | ICD-10-CM | POA: Diagnosis not present

## 2016-12-31 DIAGNOSIS — R69 Illness, unspecified: Secondary | ICD-10-CM | POA: Diagnosis not present

## 2016-12-31 DIAGNOSIS — R05 Cough: Secondary | ICD-10-CM | POA: Diagnosis not present

## 2017-01-01 DIAGNOSIS — M654 Radial styloid tenosynovitis [de Quervain]: Secondary | ICD-10-CM | POA: Diagnosis not present

## 2017-01-19 DIAGNOSIS — R69 Illness, unspecified: Secondary | ICD-10-CM | POA: Diagnosis not present

## 2017-01-19 DIAGNOSIS — F4323 Adjustment disorder with mixed anxiety and depressed mood: Secondary | ICD-10-CM | POA: Diagnosis not present

## 2017-02-13 DIAGNOSIS — R69 Illness, unspecified: Secondary | ICD-10-CM | POA: Diagnosis not present

## 2017-02-13 DIAGNOSIS — F4323 Adjustment disorder with mixed anxiety and depressed mood: Secondary | ICD-10-CM | POA: Diagnosis not present

## 2017-03-20 DIAGNOSIS — R69 Illness, unspecified: Secondary | ICD-10-CM | POA: Diagnosis not present

## 2017-03-20 DIAGNOSIS — F4323 Adjustment disorder with mixed anxiety and depressed mood: Secondary | ICD-10-CM | POA: Diagnosis not present

## 2017-04-09 ENCOUNTER — Other Ambulatory Visit: Payer: Self-pay

## 2017-04-09 MED ORDER — CLOPIDOGREL BISULFATE 75 MG PO TABS: 75.0000 mg | ORAL_TABLET | Freq: Every day | ORAL | 3 refills | Status: DC

## 2017-04-09 MED ORDER — METOPROLOL TARTRATE 25 MG PO TABS: 25.0000 mg | ORAL_TABLET | Freq: Two times a day (BID) | ORAL | 3 refills | Status: DC

## 2017-04-17 DIAGNOSIS — R69 Illness, unspecified: Secondary | ICD-10-CM | POA: Diagnosis not present

## 2017-04-17 DIAGNOSIS — F4323 Adjustment disorder with mixed anxiety and depressed mood: Secondary | ICD-10-CM | POA: Diagnosis not present

## 2017-05-11 ENCOUNTER — Encounter: Payer: Self-pay | Admitting: Nurse Practitioner

## 2017-05-18 DIAGNOSIS — R69 Illness, unspecified: Secondary | ICD-10-CM | POA: Diagnosis not present

## 2017-05-18 DIAGNOSIS — F4323 Adjustment disorder with mixed anxiety and depressed mood: Secondary | ICD-10-CM | POA: Diagnosis not present

## 2017-05-22 ENCOUNTER — Encounter (INDEPENDENT_AMBULATORY_CARE_PROVIDER_SITE_OTHER): Payer: Self-pay

## 2017-05-22 ENCOUNTER — Ambulatory Visit (INDEPENDENT_AMBULATORY_CARE_PROVIDER_SITE_OTHER): Payer: Medicare HMO | Admitting: Interventional Cardiology

## 2017-05-22 ENCOUNTER — Encounter: Payer: Self-pay | Admitting: Interventional Cardiology

## 2017-05-22 ENCOUNTER — Telehealth: Payer: Self-pay | Admitting: *Deleted

## 2017-05-22 VITALS — BP 116/80 | HR 76 | Ht 59.0 in | Wt 139.4 lb

## 2017-05-22 DIAGNOSIS — G4730 Sleep apnea, unspecified: Secondary | ICD-10-CM | POA: Diagnosis not present

## 2017-05-22 DIAGNOSIS — R400 Somnolence: Secondary | ICD-10-CM | POA: Diagnosis not present

## 2017-05-22 DIAGNOSIS — Z9861 Coronary angioplasty status: Secondary | ICD-10-CM | POA: Diagnosis not present

## 2017-05-22 DIAGNOSIS — I2510 Atherosclerotic heart disease of native coronary artery without angina pectoris: Secondary | ICD-10-CM

## 2017-05-22 DIAGNOSIS — R682 Dry mouth, unspecified: Secondary | ICD-10-CM

## 2017-05-22 DIAGNOSIS — R0683 Snoring: Secondary | ICD-10-CM

## 2017-05-22 MED ORDER — VARENICLINE TARTRATE 0.5 MG X 11 & 1 MG X 42 PO MISC: ORAL | 0 refills | Status: DC

## 2017-05-22 MED ORDER — ROSUVASTATIN CALCIUM 20 MG PO TABS: 20.0000 mg | ORAL_TABLET | Freq: Every day | ORAL | 3 refills | Status: DC

## 2017-05-22 NOTE — Patient Instructions (Addendum)
Medication Instructions:  Your physician has recommended you make the following change in your medication:   START: Chantix- Use as directed  START rosuvastatin (crestor) 20 mg daily  Labwork: None ordered  Testing/Procedures: Your physician has recommended that you have a sleep study. This test records several body functions during sleep, including: brain activity, eye movement, oxygen and carbon dioxide blood levels, heart rate and rhythm, breathing rate and rhythm, the flow of air through your mouth and nose, snoring, body muscle movements, and chest and belly movement.  Follow-Up: Your physician recommends that you schedule a follow-up appointment in: 4 months with Dr. Irish Lack    Any Other Special Instructions Will Be Listed Below (If Applicable).     If you need a refill on your cardiac medications before your next appointment, please call your pharmacy.

## 2017-05-22 NOTE — Telephone Encounter (Signed)
-----   Message from Cleon Gustin, [place] sent at 05/22/2017  2:21 PM EST ----- Regarding: Sleep Study Dr. Irish Lack ordered a sleep study for this patient for: Excessive daytime sleepiness, snoring, and dry mouth. Please forward to Sleep Study Pool for Approval and then schedule. ESS=19  Thanks, [place], Therapist, sports

## 2017-05-22 NOTE — Progress Notes (Signed)
Cardiology Office Note   Date:  05/22/2017   ID:  Kathryn Garrett, DOB 02-Mar-1946, MRN [id number]  PCP:  Imagene Riches, NP    No chief complaint on file. CAD   Wt Readings from Last 3 Encounters:  05/22/17 139 lb 6.4 oz (63.2 kg)  08/30/16 132 lb (59.9 kg)  06/20/16 129 lb 6.4 oz (58.7 kg)       History of Present Illness: Kathryn Garrett is a 72 y.o. female  with history of HTN, HL, COPD with ongoing tobacco use, CAD status post inferior MI treated with bare-metal stent to the RCA in 2010, status post DES to the proximal to mid RCA 06/2010 for instent restenosis of the bare-metal stent.She had moderate left main/LAD disease which was not significant.  She was admitted in 9/17 for [place]. Cath with ISR of RCA for which she  underwent balloon angioplasty of RCA with ISR 75%->0%. Plavix was restarted.   She presented recently to [facility] on 06/04/16 with complaint of recurrent CP c/w unstable angina. Her CP was relieved with NTG. ECG with mild ST depression in inferior leads. 1st troponin was normal. Given her history and symptomatology, she was referred for LHC. She was found to have recurrent in-stent restenosis within the proximal RCA, treated with PTCA with scoring balloon. Repeat FFR on ostial LAD confirms this is not likely significant. She was continued on DAPT with ASA + Plavix and metoprolol. Smoking cessation was advised.   Of note, she was also found to have a right carotid bruit on exam. Subsequent doppler study showed bilateral ECA stenosis >50% but no significant ICA.   Sleepiness: Falls asleep easy during the day.  Does not feel refreshed in AM.  She snores and has a dry mouth upon walking.   Denies :  Dizziness. Leg edema. Orthopnea. Palpitations. Paroxysmal nocturnal dyspnea. Shortness of breath. Syncope.  Did not tolerate atorvastatin.  Has had some occasional left arm pain and used NTG.  Not the frequency of sx. like when she had her PTCA.   Past Medical  History:  Diagnosis Date  . Anemia, mild   . Arthritis    "qwhere" (02/10/2016)  . CAD (coronary artery disease)    a. VF arrest 01/2009/CAD with inferoposterior MI s/p aspiration thrombectomy/BMS of RCA at that time. b. ISR of BMS s/p DES to RCA 06/2010 with moderate LM/LAD disease not significant by FFR. c. s/p angiosculpt PTCA to prox RCA 01/2016 for ISR. d. Aggressive PTCA to prox RCA for ISR 05/2016.  . Cardiac arrest ([place]) 01/2009   a. in setting of inf-post STEMI 01/2009 (VF).  . Chronic bronchitis ([place])   . Chronic combined systolic and diastolic CHF (congestive heart failure) ([place])    a. EF 40-45% by cath 2010. b. 60-65% with grade 1 DD by echo 09/2015  . COPD (chronic obstructive pulmonary disease) ([place])   . Hyperlipidemia 01/28/2016  . Hypertension   . MI (myocardial infarction) ([place]) 01/2009  . Narcotic abuse ([place])    pt now taking Suboxone tid  . Pneumonia 06/2014; 09/2015  . Pre-diabetes   . Pulmonary nodule   . PVD (peripheral vascular disease) ([facility])    mild atherosclerosis of infrarenal aorta, 25% ostial left renal artery stenosis, 50% ostial right common iliac by cath 2010  . S/P PTCA (percutaneous transluminal coronary angioplasty) 02/10/16 to RCA lesion for in stent restenois 02/11/2016  . Subclavian artery stenosis ([facility])    a. >50% by duplex 05/2016.  . Tobacco  abuse     Past Surgical History:  Procedure Laterality Date  . ABDOMINAL HYSTERECTOMY    . ANKLE FRACTURE SURGERY Right 2000s  . CARDIAC CATHETERIZATION N/A 02/10/2016   Procedure: Left Heart Cath and Coronary Angiography;  Surgeon: Jettie Booze, MD;  Location: [place] [facility];  Service: Cardiovascular;  Laterality: N/A;  . CARDIAC CATHETERIZATION N/A 02/10/2016   Procedure: Coronary Balloon Angioplasty;  Surgeon: Jettie Booze, MD;  Location: [place] [facility];  Service: Cardiovascular;  Laterality: N/A;  instent RCA  . CARDIAC CATHETERIZATION N/A 06/05/2016   Procedure: Left Heart Cath and Coronary  Angiography;  Surgeon: Leonie Man, MD;  Location: [place] [facility];  Service: Cardiovascular;  Laterality: N/A;  . CARDIAC CATHETERIZATION N/A 06/05/2016   Procedure: Coronary Balloon Angioplasty;  Surgeon: Leonie Man, MD;  Location: [place] [facility];  Service: Cardiovascular;  Laterality: N/A;  . CHEST TUBE INSERTION N/A 10/08/2015   Procedure: PLEURX CATH REMOVAL;  Surgeon: [name], MD;  Location: [facility];  Service: Thoracic;  Laterality: N/A;  . CORONARY ANGIOPLASTY WITH STENT PLACEMENT  01/2009; 2012  . FRACTURE SURGERY    . VIDEO ASSISTED THORACOSCOPY (VATS)/THOROCOTOMY Left 09/29/2015   Procedure: PREOP BRONCHOSCOPY, LEFT THORACOSCOPY, POSSIBLE THORACOTOMY, PLEURAL BIOPSY, TALC;  Surgeon: [name], MD;  Location: [facility];  Service: General;  Laterality: Left;     Current Outpatient Medications  Medication Sig Dispense Refill  . acetaminophen (TYLENOL) 500 MG tablet Take 1,000 mg by mouth every 6 (six) hours as needed for headache.    . albuterol (PROVENTIL HFA;VENTOLIN HFA) 108 (90 Base) MCG/ACT inhaler Inhale 2 puffs into the lungs every 4 (four) hours as needed for wheezing or shortness of breath.    Marland Kitchen aspirin EC 81 MG tablet Take 81 mg by mouth at bedtime.     . Buprenorphine HCl-Naloxone HCl 8-2 MG FILM Place 1 Film under the tongue 3 (three) times daily.    . clopidogrel (PLAVIX) 75 MG tablet Take 1 tablet (75 mg total) by mouth at bedtime. 60 tablet 3  . metoprolol tartrate (LOPRESSOR) 25 MG tablet Take 1 tablet (25 mg total) by mouth 2 (two) times daily. 180 tablet 3  . nitroGLYCERIN (NITROSTAT) 0.4 MG SL tablet Place 1 tablet (0.4 mg total) under the tongue every 5 (five) minutes as needed for chest pain. 25 tablet 4  . varenicline (CHANTIX STARTING MONTH PAK) 0.5 MG X 11 & 1 MG X 42 tablet Take one 0.5 mg tablet by mouth once daily for 3 days, then increase to one 0.5 mg tablet twice daily for 4 days, then increase to one 1 mg tablet twice daily. 53 tablet 0     No current facility-administered medications for this visit.     Allergies:   Azithromycin    Social History:  The patient  reports that she has been smoking cigarettes.  She has a 28.00 pack-year smoking history. she has never used smokeless tobacco. She reports that she does not drink alcohol or use drugs.   Family History:  The patient's family history includes Cancer in her brother; Coronary artery disease in her father and mother.    ROS:  Please see the history of present illness.   Otherwise, review of systems are positive for falls asleep easily during the day.   All other systems are reviewed and negative.    PHYSICAL EXAM: VS:  BP 116/80   Pulse 76   Ht 4\' 11"  (1.499 m)  Wt 139 lb 6.4 oz (63.2 kg)   SpO2 93%   BMI 28.16 kg/m  , BMI Body mass index is 28.16 kg/m. GEN: Well nourished, well developed, in no acute distress  HEENT: normal  Neck: no JVD, carotid bruits, or masses Cardiac: RRR; no murmurs, rubs, or gallops,no edema  Respiratory:  clear to auscultation bilaterally, normal work of breathing GI: soft, nontender, nondistended, + BS MS: no deformity or atrophy  Skin: warm and dry, no rash Neuro:  Strength and sensation are intact Psych: euthymic mood, full affect   EKG:   The ekg ordered today demonstrates NSR, no ST changes   Recent Labs: 06/06/2016: BUN 10; Creatinine, Ser 0.64; Hemoglobin 11.7; Platelets 229; Potassium 3.9; Sodium 137   Lipid Panel    Component Value Date/Time   CHOL 113 06/05/2016 0911   CHOL 85 03/20/2013 0557   TRIG 120 06/05/2016 0911   TRIG 112 03/20/2013 0557   HDL 32 (L) 06/05/2016 0911   HDL 21 (L) 03/20/2013 0557   CHOLHDL 3.5 06/05/2016 0911   VLDL 24 06/05/2016 0911   VLDL 22 03/20/2013 0557   LDLCALC 57 06/05/2016 0911   LDLCALC 42 03/20/2013 0557     Other studies Reviewed: Additional studies/ records that were reviewed today with results demonstrating: cath results reviewed.   ASSESSMENT AND  PLAN:  1. CAD: Mild anginal sx.  We discussed eval with cath but the patient states they are rare, not like before her last to PTCA.  Sx typically in the left arm.  She will let us know if they get worse.  Did not tolerate atorvastatin.  Start Crestor 20 mg daily. No problems in the past on Crestor.  Check labs at that time.  If she required PCI of the same spot in the RCA that has been troubling, would use SYNERGY DES to see if this gave a better result.   2. Sleepiness: Falls asleep easy during the day.  Does not feel refreshed in AM.  She snores and has a dry mouth upon walking.  Will plan for sleep study.  3. Tobacco abuse: Willing to try Chantix.  WIll give Rx.   Current medicines are reviewed at length with the patient today.  The patient concerns regarding her medicines were addressed.  The following changes have been made:  No change  Labs/ tests ordered today include:   Orders Placed This Encounter  Procedures  . EKG 12-Lead  . Split night study    Recommend 150 minutes/week of aerobic exercise Low fat, low carb, high fiber diet recommended  Disposition:   FU in 3 months to check angina   Signed, Larae Grooms, MD  05/22/2017 1:54 PM    [facility] [place], [place], [place]  [address] Phone: [phone]; Fax: [phone]

## 2017-05-22 NOTE — Telephone Encounter (Signed)
Sent to sleep pool. 

## 2017-05-29 ENCOUNTER — Telehealth: Payer: Self-pay

## 2017-05-29 ENCOUNTER — Encounter: Payer: Self-pay | Admitting: Adult Health

## 2017-05-29 ENCOUNTER — Encounter: Payer: Self-pay | Admitting: Nurse Practitioner

## 2017-05-29 ENCOUNTER — Ambulatory Visit (INDEPENDENT_AMBULATORY_CARE_PROVIDER_SITE_OTHER): Payer: Medicare HMO | Admitting: Nurse Practitioner

## 2017-05-29 VITALS — BP 128/80 | HR 66 | Ht 59.0 in | Wt 137.7 lb

## 2017-05-29 DIAGNOSIS — Z7901 Long term (current) use of anticoagulants: Secondary | ICD-10-CM | POA: Diagnosis not present

## 2017-05-29 DIAGNOSIS — Z1211 Encounter for screening for malignant neoplasm of colon: Secondary | ICD-10-CM

## 2017-05-29 NOTE — Progress Notes (Unsigned)
   Primary Cardiologist: No primary care provider on file.  Chart reviewed as part of pre-operative protocol coverage. Given past medical history and time since last visit, based on ACC/AHA guidelines, Kathryn Garrett would be at acceptable risk for the planned procedure without further cardiovascular testing.   The patient is on Plavix will need to stop this from 5-7 days prior to procedure if polypectomy or biopsies are anticipated.  I will route this recommendation to the requesting party via Epic fax function and remove from pre-op pool.  Please call with questions.  Jory Sims, NP 05/29/2017, 2:49 PM

## 2017-05-29 NOTE — Telephone Encounter (Signed)
  [facility] [address] [place], [place]  [address] Phone:  [phone]   Fax:  [phone]  05/29/2017   RE:      Kathryn Garrett DOB:   22-May-1945 MRN:   [id number]   Dear Dr. Irish Lack,    We have scheduled the above patient for an endoscopic procedure. Our records show that she is on anticoagulation therapy.   Please advise as to how long the patient may come off her therapy of Plavix prior to the colonoscopy procedure, which is scheduled for 06/12/17.  Please fax back/ or route the completed form to [place] at [phone].   Sincerely,    Thurmon Fair, RMA

## 2017-05-29 NOTE — Progress Notes (Addendum)
Chief Complaint:  Colon cancer screening  Referring Provider:      Self-referred   ASSESSMENT AND PLAN;   19.  72 year old female self-referred for colon cancer screening (request of insurance company).   -Patient will be scheduled for a screening colonoscopy with possible polypectomy.  The risks and benefits of the procedure were discussed and the patient agrees to proceed.  -Patient has a significant cardiac history.  She has had recurrent restenosis of stents .  Cardiology might feel it is too risky for her to hold Plavix for screening colonoscopy.  If so then we can pursue noninvasive methods of colon cancer screening    2.  Significant cardiac history  / chronic Plavix for cardiac stents / chronic combined systolic and diastolic CHF. EF 60-65%.  CAD /MI s/p BMS in 2010. Had DES in 2012 for in-stent restenosis of the bare-metal stent.  Admitted September 2017 for unstable angina.  Cath done - in-stent restenosis of RCA, s/p balloon angioplasty.  [facility]2016 again with unstable angina / mild ST depression.  Had LHC, found to have recurrent in-stent restenosis within the proximal RCA treated with PTCA with balloon.  Last seen by cardiology on the eighth of this month.   3. COPD, ongoing tobacco use   Agree with Ms. Maui Ahart's assessment and plan.  If unable to hold clopidogrel we can do a colonoscopy on that and anticipate clipping post-polypectomy if needed.  Gatha Mayer, MD, [facility]    HPI:    Patient is a 72 year old female here for colon cancer screening.  Never had a colonoscopy, apparently her insurance recommended she have one done. No bowel changes, rectal bleeding, weight loss or other alarm features.  She has mild normocytic anemia but this is chronic.  No upper GI complaints.  Patient has a significant cardiac history, she is on chronic Plavix.  Last cardiology visit was earlier this month, she had mild chest pain   Past Medical History:    Diagnosis Date  . Anemia, mild   . Arthritis    "qwhere" (02/10/2016)  . CAD (coronary artery disease)    a. VF arrest 01/2009/CAD with inferoposterior MI s/p aspiration thrombectomy/BMS of RCA at that time. b. ISR of BMS s/p DES to RCA 06/2010 with moderate LM/LAD disease not significant by FFR. c. s/p angiosculpt PTCA to prox RCA 01/2016 for ISR. d. Aggressive PTCA to prox RCA for ISR 05/2016.  . Cardiac arrest ([place]) 01/2009   a. in setting of inf-post STEMI 01/2009 (VF).  . Chronic bronchitis ([place])   . Chronic combined systolic and diastolic CHF (congestive heart failure) ([place])    a. EF 40-45% by cath 2010. b. 60-65% with grade 1 DD by echo 09/2015  . COPD (chronic obstructive pulmonary disease) ([place])   . Hyperlipidemia 01/28/2016  . Hypertension   . MI (myocardial infarction) ([place]) 01/2009  . Narcotic abuse ([place])    pt now taking Suboxone tid  . Pneumonia 06/2014; 09/2015  . Pre-diabetes   . Pulmonary nodule   . PVD (peripheral vascular disease) ([facility])    mild atherosclerosis of infrarenal aorta, 25% ostial left renal artery stenosis, 50% ostial right common iliac by cath 2010  . S/P PTCA (percutaneous transluminal coronary angioplasty) 02/10/16 to RCA lesion for in stent restenois 02/11/2016  . Subclavian artery stenosis ([facility])    a. >50% by duplex 05/2016.  . Tobacco abuse      Past Surgical History:  Procedure Laterality Date  .  ABDOMINAL HYSTERECTOMY    . ANKLE FRACTURE SURGERY Right 2000s  . CARDIAC CATHETERIZATION N/A 02/10/2016   Procedure: Left Heart Cath and Coronary Angiography;  Surgeon: Jettie Booze, MD;  Location: [place] [facility];  Service: Cardiovascular;  Laterality: N/A;  . CARDIAC CATHETERIZATION N/A 02/10/2016   Procedure: Coronary Balloon Angioplasty;  Surgeon: Jettie Booze, MD;  Location: [place] [facility];  Service: Cardiovascular;  Laterality: N/A;  instent RCA  . CARDIAC CATHETERIZATION N/A 06/05/2016   Procedure: Left Heart Cath and Coronary  Angiography;  Surgeon: Leonie Man, MD;  Location: [place] [facility];  Service: Cardiovascular;  Laterality: N/A;  . CARDIAC CATHETERIZATION N/A 06/05/2016   Procedure: Coronary Balloon Angioplasty;  Surgeon: Leonie Man, MD;  Location: [place] [facility];  Service: Cardiovascular;  Laterality: N/A;  . CHEST TUBE INSERTION N/A 10/08/2015   Procedure: PLEURX CATH REMOVAL;  Surgeon: [name], MD;  Location: [facility];  Service: Thoracic;  Laterality: N/A;  . CORONARY ANGIOPLASTY WITH STENT PLACEMENT  01/2009; 2012  . FRACTURE SURGERY    . VIDEO ASSISTED THORACOSCOPY (VATS)/THOROCOTOMY Left 09/29/2015   Procedure: PREOP BRONCHOSCOPY, LEFT THORACOSCOPY, POSSIBLE THORACOTOMY, PLEURAL BIOPSY, TALC;  Surgeon: [name], MD;  Location: [facility];  Service: General;  Laterality: Left;   Family History  Problem Relation Age of Onset  . Coronary artery disease Father   . Coronary artery disease Mother   . Cancer Brother    Social History   Tobacco Use  . Smoking status: Current Every Day Smoker    Packs/day: 0.50    Years: 56.00    Pack years: 28.00    Types: Cigarettes  . Smokeless tobacco: Never Used  Substance Use Topics  . Alcohol use: No    Alcohol/week: 0.0 oz  . Drug use: No   Current Outpatient Medications  Medication Sig Dispense Refill  . acetaminophen (TYLENOL) 500 MG tablet Take 1,000 mg by mouth every 6 (six) hours as needed for headache.    . albuterol (PROVENTIL HFA;VENTOLIN HFA) 108 (90 Base) MCG/ACT inhaler Inhale 2 puffs into the lungs every 4 (four) hours as needed for wheezing or shortness of breath.    Marland Kitchen aspirin EC 81 MG tablet Take 81 mg by mouth at bedtime.     . Buprenorphine HCl-Naloxone HCl 8-2 MG FILM Place 1 Film under the tongue 3 (three) times daily.    . clopidogrel (PLAVIX) 75 MG tablet Take 1 tablet (75 mg total) by mouth at bedtime. 60 tablet 3  . metoprolol tartrate (LOPRESSOR) 25 MG tablet Take 1 tablet (25 mg total) by mouth 2 (two) times  daily. 180 tablet 3  . nitroGLYCERIN (NITROSTAT) 0.4 MG SL tablet Place 1 tablet (0.4 mg total) under the tongue every 5 (five) minutes as needed for chest pain. 25 tablet 4  . rosuvastatin (CRESTOR) 20 MG tablet Take 1 tablet (20 mg total) by mouth daily. 90 tablet 3  . varenicline (CHANTIX STARTING MONTH PAK) 0.5 MG X 11 & 1 MG X 42 tablet Take one 0.5 mg tablet by mouth once daily for 3 days, then increase to one 0.5 mg tablet twice daily for 4 days, then increase to one 1 mg tablet twice daily. 53 tablet 0   No current facility-administered medications for this visit.    Allergies  Allergen Reactions  . Azithromycin Rash     Review of Systems: All systems reviewed and negative except where noted in HPI.     Physical Exam:  BP 128/80   Pulse 66   Ht 4\' 11"  (1.499 m)   Wt 137 lb 11.2 oz (62.5 kg)   SpO2 96%   BMI 27.81 kg/m  Constitutional:  Well-developed, white female in no acute distress. Psychiatric: Normal mood and affect. Behavior is normal. EENT: Pupils normal.  Conjunctivae are normal. No scleral icterus. Neck supple.  Cardiovascular: Normal rate, regular rhythm. No edema Pulmonary/chest: Effort normal and breath sounds normal. No wheezing, rales or rhonchi. Abdominal: Soft, nondistended. Nontender. Bowel sounds active throughout. There are no masses palpable. No hepatomegaly. Lymphadenopathy: No cervical adenopathy noted. Neurological: Alert and oriented to person place and time. Skin: Skin is warm and dry. No rashes noted.  Tye Savoy, NP  05/29/2017, 1:38 PM   Imagene Riches, NP

## 2017-05-29 NOTE — Telephone Encounter (Signed)
**Note De-Identified [name] Obfuscation** Approval on Chantix PA received Kathryn Garrett fax from [place]. Approval good from 05/13/2017 until 11/26/2017.

## 2017-05-29 NOTE — Patient Instructions (Addendum)
If you are age 73 or older, your body mass index should be between 23-30. Your Body mass index is 27.81 kg/m. If this is out of the aforementioned range listed, please consider follow up with your Primary Care Provider.  If you are age 88 or younger, your body mass index should be between 19-25. Your Body mass index is 27.81 kg/m. If this is out of the aformentioned range listed, please consider follow up with your Primary Care Provider.   You have been scheduled for a colonoscopy. Please follow written instructions given to you at your visit today.  Please pick up your prep supplies at the pharmacy within the next 1-3 days. If you use inhalers (even only as needed), please bring them with you on the day of your procedure. Your physician has requested that you go to www.startemmi.com and enter the access code given to you at your visit today. This web site gives a general overview about your procedure. However, you should still follow specific instructions given to you by our office regarding your preparation for the procedure.  You will be contacted by our office prior to your procedure for directions on holding your Plavix.  If you do not hear from our office 1 week prior to your scheduled procedure, please call [phone] to discuss.   Thank you for choosing me and [facility].   Tye Savoy, NP

## 2017-05-29 NOTE — Telephone Encounter (Signed)
**Note De-Identified Kathryn Garrett Obfuscation** I have done a Chantix PA through covermymeds.

## 2017-05-30 ENCOUNTER — Encounter: Payer: Self-pay | Admitting: Nurse Practitioner

## 2017-05-30 ENCOUNTER — Telehealth: Payer: Self-pay | Admitting: Interventional Cardiology

## 2017-05-30 NOTE — Telephone Encounter (Signed)
Patient calling and states that she is suppose to have a colonoscopy done and will need to hold her plavix. Patient asking if this is okay to do. Jory Sims, NP has already cleared the patient to hold plavix 5-7 days prior to procedure. Also discussed with Dr. Irish Lack, who is okay with the patient holding her plavix prior to her colonoscopy. Made patient aware. She verbalizes understanding.

## 2017-05-30 NOTE — Telephone Encounter (Signed)
Kathryn Garrett is calling because she has a question for you about her colonoscopy . Please call

## 2017-06-01 ENCOUNTER — Telehealth: Payer: Self-pay | Admitting: Interventional Cardiology

## 2017-06-01 NOTE — Telephone Encounter (Signed)
New message   Patient daughter calling with concerns about patient being weak and does not have appetite. Patient daughter is requesting a stress test be ordered. Please call

## 2017-06-01 NOTE — Telephone Encounter (Signed)
Returned call to patient's daughter Amy. There is no DPR on file for her. Made her aware that I cannot speak to her regarding the patient's without permission. Daughter called the patient on 3-way for her to give permission. Patient on the phone and gives permission for me to speak to daughter regarding her care.   Patient's daughter states that she is concerned that her mother has been weak with no appetite and has been very sleepy for the past 3 weeks. The daughter states that this is how the patient was when she had her last heart attack. Daughter states that she feels like the patient needs a stress test.   The patient is on the phone yelling at her daughter stating that she is not having any pain and has not had to use any NTG and that at her OV on 05/22/17 with Dr. Irish Lack he ordered for her to have a sleep study. Patient is saying that she has just had a hard time staying awake. Made patient and daughter aware that the seelp study has been ordered and that it has to be approved by her insurance prior to scheduling. (Spoke with Gae Bon, Sleep Coordinator, who has sent this to the pre-cert pool for sleep studies and it has not been approved to schedule yet.) Daughter is yelling back at the patient telling her that she is just scared to admit that something is wrong and that she knows her mother better than anyone else and she feels like she needs a stress test.   Asked the patient if she was willing or if she wanted to do a stress test if Dr. Irish Lack felt like that was necessary. Patient states that she would be willing to do it if he felt it necessary. Made the patient and her daughter both aware that the information would be forwarded to Dr. Irish Lack for review and recommendation. ER precautions reviewed. They verbalize understanding.

## 2017-06-05 NOTE — Telephone Encounter (Signed)
Attempted to return call to daughter but there was no answer and VM was full.   Attempted to contact patient but there was no answer. Left message for patient to call back.  Reviewed with Dr. Irish Lack. Per Dr. Irish Lack, we will hold off on ordering the stress test for now and patient should proceed with endoscopic procedure that she has already been cleared for.

## 2017-06-06 ENCOUNTER — Telehealth: Payer: Self-pay

## 2017-06-06 NOTE — Telephone Encounter (Signed)
I called patient this morning to advise her when to stop Plavix prior to her procedure.  She stated that she needed reschedule her procedure because she states she may have a blockage; that Dr. Hassell Done office contacted her yesterday.  I rescheduled her colonoscopy for 07/1517 at 330 pm.  I explained to her that she only needed to change the dates on her instructions; since the time of the procedure is the same, she would not need to change the times in her instructions.  Also advised that she would need to stop Plavix 5-7 days prior to 07/27/17 as advised by her cardiologist unless they advise her differently.  Patient verbalized understanding.

## 2017-06-08 NOTE — Telephone Encounter (Signed)
Kathryn Garrett is returning your call . Thanks

## 2017-06-08 NOTE — Telephone Encounter (Signed)
Returned call to patient. Patient states that she has been feeling pretty good other than having the sleepy spells. Made her aware that per Dr. Irish Lack, we will hold off on ordering the stress test for now and that she should proceed with the endoscopic procedure that she has already been cleared for. Touched base with Gae Bon, Sleep Coordinator who is going to follow up on the patient's sleep study. Made patient aware that she should be contacted soon about scheduling her sleep study. Patient verbalizes understanding and thanked me for the call.

## 2017-06-12 ENCOUNTER — Encounter: Payer: Medicare HMO | Admitting: Internal Medicine

## 2017-06-14 DIAGNOSIS — R69 Illness, unspecified: Secondary | ICD-10-CM | POA: Diagnosis not present

## 2017-06-14 DIAGNOSIS — F4323 Adjustment disorder with mixed anxiety and depressed mood: Secondary | ICD-10-CM | POA: Diagnosis not present

## 2017-07-08 ENCOUNTER — Ambulatory Visit (HOSPITAL_BASED_OUTPATIENT_CLINIC_OR_DEPARTMENT_OTHER): Payer: Medicare HMO | Attending: Interventional Cardiology | Admitting: Cardiology

## 2017-07-08 VITALS — Ht 59.0 in | Wt 134.0 lb

## 2017-07-08 DIAGNOSIS — G4710 Hypersomnia, unspecified: Secondary | ICD-10-CM | POA: Diagnosis not present

## 2017-07-08 DIAGNOSIS — R682 Dry mouth, unspecified: Secondary | ICD-10-CM

## 2017-07-08 DIAGNOSIS — R0683 Snoring: Secondary | ICD-10-CM

## 2017-07-08 DIAGNOSIS — R400 Somnolence: Secondary | ICD-10-CM

## 2017-07-08 DIAGNOSIS — G4734 Idiopathic sleep related nonobstructive alveolar hypoventilation: Secondary | ICD-10-CM

## 2017-07-08 DIAGNOSIS — R0902 Hypoxemia: Secondary | ICD-10-CM | POA: Insufficient documentation

## 2017-07-09 NOTE — Procedures (Signed)
   NAME: JEARLEAN DEMAURO DATE OF BIRTH:  1946/02/21 MEDICAL RECORD NUMBER [id number]  LOCATION: [facility]  PHYSICIAN: Callum Wolf  DATE OF STUDY: 07/08/2017  SLEEP STUDY TYPE: Nocturnal Polysomnogram               REFERRING PHYSICIAN: Jettie Booze, MD   Gender: Female D.O.B: 02-Oct-1945 Age (years): 71 Referring Provider: Larae Grooms Height (inches): 32 Interpreting Physician: Fransico Him MD, ABSM Weight (lbs): 134 RPSGT: Earney Hamburg BMI: 27 MRN: [id number] Neck Size: 14.00  CLINICAL INFORMATION Sleep Study Type: NPSG  Indication for sleep study: Excessive Daytime Sleepiness, Snoring  Epworth Sleepiness Score: 15  SLEEP STUDY TECHNIQUE As per the AASM Manual for the Scoring of Sleep and Associated Events v2.3 (April 2016) with a hypopnea requiring 4% desaturations.  The channels recorded and monitored were frontal, central and occipital EEG, electrooculogram (EOG), submentalis EMG (chin), nasal and oral airflow, thoracic and abdominal wall motion, anterior tibialis EMG, snore microphone, electrocardiogram, and pulse oximetry.  MEDICATIONS Medications self-administered by patient taken the night of the study : ROSUVASTAIN CALCIUM , CLOPIDOGREL, METOPROLOL TARTRATE  SLEEP ARCHITECTURE The study was initiated at 9:58:10 PM and ended at 4:44:11 AM.  Sleep onset time was 5.4 minutes and the sleep efficiency was 95.0%%. The total sleep time was 385.6 minutes.  Stage REM latency was 81.5 minutes.  The patient spent 0.9%% of the night in stage N1 sleep, 86.9%% in stage N2 sleep, 0.5%% in stage N3 and 11.67% in REM.  Alpha intrusion was absent.  Supine sleep was 71.97%.  RESPIRATORY PARAMETERS The overall apnea/hypopnea index (AHI) was 0.0 per hour. There were 0 total apneas, including 0 obstructive, 0 central and 0 mixed apneas. There were 0 hypopneas and 4 RERAs.  The AHI during Stage REM sleep was 0.0 per hour.  AHI while  supine was 0.0 per hour.  The mean oxygen saturation was 93.8%. The minimum SpO2 during sleep was 85.0%.  loud snoring was noted during this study.  CARDIAC DATA The 2 lead EKG demonstrated sinus rhythm. The mean heart rate was 75.1 beats per minute. Other EKG findings include: None.  LEG MOVEMENT DATA The total PLMS were 0 with a resulting PLMS index of 0.0. Associated arousal with leg movement index was 0.0 .  IMPRESSIONS - No significant obstructive sleep apnea occurred during this study (AHI = 0.0/h). - No significant central sleep apnea occurred during this study (CAI = 0.0/h). - Mild oxygen desaturation was noted during this study (Min O2 = 85.0%). - The patient snored with loud snoring volume. - No cardiac abnormalities were noted during this study. - Clinically significant periodic limb movements did not occur during sleep. No significant associated arousals. - test 2 of template  DIAGNOSIS - Nocturnal Hypoxemia (327.26 [G47.36 ICD-10])  RECOMMENDATIONS - Avoid alcohol, sedatives and other CNS depressants that may worsen sleep apnea and disrupt normal sleep architecture. - Sleep hygiene should be reviewed to assess factors that may improve sleep quality. - Weight management and regular exercise should be initiated or continued if appropriate. - Oxygen at 2L [place] during sleep - Overnight pulse oximetry on O2  Devonte Migues Diplomate, Tax adviser of Sleep Medicine  ELECTRONICALLY SIGNED ON:  07/09/2017, 11:00 PM [place] PH: [phone]   FX: (336) [place]

## 2017-07-10 DIAGNOSIS — F4323 Adjustment disorder with mixed anxiety and depressed mood: Secondary | ICD-10-CM | POA: Diagnosis not present

## 2017-07-10 DIAGNOSIS — R69 Illness, unspecified: Secondary | ICD-10-CM | POA: Diagnosis not present

## 2017-07-12 ENCOUNTER — Telehealth: Payer: Self-pay | Admitting: *Deleted

## 2017-07-12 NOTE — Telephone Encounter (Signed)
-----   Message from Sueanne Margarita, MD sent at 07/09/2017 11:05 PM EST ----- Please let patient know that sleep study showed no significant sleep apnea but did show nocturnal hypoxemia - please start O2 at 2L [place] a night time.  Please get an overnigh pulse ox on 2 L O2

## 2017-07-12 NOTE — Telephone Encounter (Signed)
Informed patient of sleep study results and patient understanding was verbalized. Patient understands her sleep study showed no significant sleep apnea but did show nocturnal hypoxemia (night time oxygen drops) -Patient understands Dr Radford Pax recommends she start O2 at 2L [place] at night time. Patient understand Dr Radford Pax recommends she get an overnight pulse ox on 2 L O2. Patient understands she will be contacted by [facility] to set up her 02 and her overnight pulse ox on 2L 02. She understands to call if [facility] does not contact her with new setup in a timely manner.  [facility] notified of new cpap order  She was grateful for the call and thanked me.

## 2017-07-26 ENCOUNTER — Telehealth: Payer: Self-pay | Admitting: Internal Medicine

## 2017-07-26 ENCOUNTER — Telehealth: Payer: Self-pay | Admitting: *Deleted

## 2017-07-26 NOTE — Telephone Encounter (Signed)
Patient will undergo additional testing for her nocturnal hypoxemia. She may be starting on home O2.  She will call back when they complete her medical testing.

## 2017-07-26 NOTE — Telephone Encounter (Signed)
OK no charge ?

## 2017-07-26 NOTE — Telephone Encounter (Signed)
Please advise as to rescheduling. Pt is not sure if she should have a colonoscopy at this time.

## 2017-07-27 ENCOUNTER — Encounter: Payer: Medicare HMO | Admitting: Internal Medicine

## 2017-07-30 NOTE — Telephone Encounter (Signed)
This chart was accessed in error. SM

## 2017-08-03 ENCOUNTER — Telehealth: Payer: Self-pay | Admitting: Interventional Cardiology

## 2017-08-03 NOTE — Telephone Encounter (Signed)
Spoke with patient who reports she has never beencontacted about the overnight oximetry or home 02.  Called [facility] at [phone] and spoke with Ivin Booty.  They use a company [employer] and she will contact them to determine the status.

## 2017-08-03 NOTE — Telephone Encounter (Signed)
  Freada Bergeron, [place]  Certified Medical Assistant                   [] Hide copied text  [] Hover for details   Informed patient of sleep study results and patient understanding was verbalized. Patient understands her sleep study showed no significant sleep apnea but did show nocturnal hypoxemia (night time oxygen drops) -Patient understands Dr Radford Pax recommends she start O2 at 2L [place] at night time. Patient understand Dr Radford Pax recommends she get an overnight pulse ox on 2 L O2. Patient understands she will be contacted by [place] to set up her 02 and her overnight pulse ox on 2L 02. She understands to call if [facility] does not contact her with new setup in a timely manner.  [facility] notified of new cpap order  She was grateful for the call and thanked me.            Left message for pt to c/b to discuss her needs.  I believe the above information she what she referring to.

## 2017-08-03 NOTE — Telephone Encounter (Signed)
Kathryn Garrett is calling about a test in which she is supposed to have because he oxygen level drops to 71 while she is asleep . Please call

## 2017-08-03 NOTE — Telephone Encounter (Signed)
Follow Up ° °Pt returning call for nurse °

## 2017-08-06 NOTE — Telephone Encounter (Signed)
Never received a call back from [place].  Will forward to [facility] for f/u.

## 2017-08-07 DIAGNOSIS — F4323 Adjustment disorder with mixed anxiety and depressed mood: Secondary | ICD-10-CM | POA: Diagnosis not present

## 2017-08-07 DIAGNOSIS — R69 Illness, unspecified: Secondary | ICD-10-CM | POA: Diagnosis not present

## 2017-08-07 NOTE — Telephone Encounter (Signed)
Patient has a 10 week follow up appointment scheduled for  October 30 2017 with doctor Turner. Patient understands she needs to keep this appointment for insurance compliance. Patient was grateful for the call and thanked me.

## 2017-08-09 NOTE — Telephone Encounter (Signed)
Called Sharon at [facility] to follow up on patient's ONO and she says she will call call [employer] and call me back today.

## 2017-08-28 ENCOUNTER — Encounter: Payer: Medicare HMO | Admitting: Internal Medicine

## 2017-09-04 DIAGNOSIS — F4323 Adjustment disorder with mixed anxiety and depressed mood: Secondary | ICD-10-CM | POA: Diagnosis not present

## 2017-09-04 DIAGNOSIS — R69 Illness, unspecified: Secondary | ICD-10-CM | POA: Diagnosis not present

## 2017-09-07 ENCOUNTER — Encounter: Payer: Self-pay | Admitting: Interventional Cardiology

## 2017-09-20 NOTE — Telephone Encounter (Signed)
Spoke to patient today 09/20/17 and gave her contact information for [facility] at [phone] to call and be set up for a ONO pulse ox and 02. [facility] or [facility]  have not be able to reach the patient until now. Pt is aware and agreeable to treatment.

## 2017-09-24 NOTE — Progress Notes (Signed)
Cardiology Office Note   Date:  09/25/2017   ID:  Kathryn Garrett, DOB July 28, 1945, MRN [id number]  PCP:  Imagene Riches, NP    No chief complaint on file.  CAD  Wt Readings from Last 3 Encounters:  09/25/17 139 lb (63 kg)  07/08/17 134 lb (60.8 kg)  05/29/17 137 lb 11.2 oz (62.5 kg)       History of Present Illness: Kathryn Garrett is a 72 y.o. female  with history of HTN, HL, COPD with ongoing tobacco use, CAD status post inferior MI treated with bare-metal stent to the RCA in 2010,status post DES to the proximal to mid RCA 06/2010 for instent restenosis of the bare-metal stent.She had moderate left main/LAD disease which was not significant.  She was admitted in 9/17 for [place]. Cath with ISR of RCAfor which sheunderwent balloon angioplasty of RCA with ISR 75%->0%. Plavix wasrestarted.   She presented recently to [facility] on [facility]16 with complaint of recurrent CP c/w unstable angina. Her CP was relieved with NTG.ECG with mild ST depression in inferior leads. 1st troponinwasnormal. Given her history and symptomatology, she was referred for LHC. She was found to have recurrent in-stent restenosis within the proximal RCA, treated with PTCA with scoring balloon.Repeat FFR on ostial LAD confirms this was not significant.She was continued on DAPT with ASA + Plavix and metoprolol. Smoking cessation was advised.   Of note, she was also found to have a right carotid bruit on exam. Subsequent doppler study showed bilateral ECA stenosis >50%but no significant ICA.   Since the last visit, she had a couple of episodes of left arm pain.  THey last a few seconds and resolve.  She was found to desat duing sleep into the 70s.    She has some pain in her calves with walking.     She is still tying Chantix.  SHe has cut back significantly.       Past Medical History:  Diagnosis Date  . Anemia, mild   . Arthritis    "qwhere" (02/10/2016)  . CAD (coronary artery disease)    a.  VF arrest 01/2009/CAD with inferoposterior MI s/p aspiration thrombectomy/BMS of RCA at that time. b. ISR of BMS s/p DES to RCA 06/2010 with moderate LM/LAD disease not significant by FFR. c. s/p angiosculpt PTCA to prox RCA 01/2016 for ISR. d. Aggressive PTCA to prox RCA for ISR 05/2016.  . Cardiac arrest ([place]) 01/2009   a. in setting of inf-post STEMI 01/2009 (VF).  . Chronic bronchitis ([place])   . Chronic combined systolic and diastolic CHF (congestive heart failure) ([place])    a. EF 40-45% by cath 2010. b. 60-65% with grade 1 DD by echo 09/2015  . COPD (chronic obstructive pulmonary disease) ([place])   . Hyperlipidemia 01/28/2016  . Hypertension   . MI (myocardial infarction) ([place]) 01/2009  . Narcotic abuse ([place])    pt now taking Suboxone tid  . Pneumonia 06/2014; 09/2015  . Pre-diabetes   . Pulmonary nodule   . PVD (peripheral vascular disease) ([facility])    mild atherosclerosis of infrarenal aorta, 25% ostial left renal artery stenosis, 50% ostial right common iliac by cath 2010  . S/P PTCA (percutaneous transluminal coronary angioplasty) 02/10/16 to RCA lesion for in stent restenois 02/11/2016  . Subclavian artery stenosis ([facility])    a. >50% by duplex 05/2016.  . Tobacco abuse     Past Surgical History:  Procedure Laterality Date  . ABDOMINAL HYSTERECTOMY    .  ANKLE FRACTURE SURGERY Right 2000s  . CARDIAC CATHETERIZATION N/A 02/10/2016   Procedure: Left Heart Cath and Coronary Angiography;  Surgeon: Jettie Booze, MD;  Location: [place] [facility];  Service: Cardiovascular;  Laterality: N/A;  . CARDIAC CATHETERIZATION N/A 02/10/2016   Procedure: Coronary Balloon Angioplasty;  Surgeon: Jettie Booze, MD;  Location: [place] [facility];  Service: Cardiovascular;  Laterality: N/A;  instent RCA  . CARDIAC CATHETERIZATION N/A 06/05/2016   Procedure: Left Heart Cath and Coronary Angiography;  Surgeon: Leonie Man, MD;  Location: [place] [facility];  Service: Cardiovascular;  Laterality: N/A;  .  CARDIAC CATHETERIZATION N/A 06/05/2016   Procedure: Coronary Balloon Angioplasty;  Surgeon: Leonie Man, MD;  Location: [place] [facility];  Service: Cardiovascular;  Laterality: N/A;  . CHEST TUBE INSERTION N/A 10/08/2015   Procedure: PLEURX CATH REMOVAL;  Surgeon: [name], MD;  Location: [facility];  Service: Thoracic;  Laterality: N/A;  . CORONARY ANGIOPLASTY WITH STENT PLACEMENT  01/2009; 2012  . FRACTURE SURGERY    . VIDEO ASSISTED THORACOSCOPY (VATS)/THOROCOTOMY Left 09/29/2015   Procedure: PREOP BRONCHOSCOPY, LEFT THORACOSCOPY, POSSIBLE THORACOTOMY, PLEURAL BIOPSY, TALC;  Surgeon: [name], MD;  Location: [facility];  Service: General;  Laterality: Left;     Current Outpatient Medications  Medication Sig Dispense Refill  . acetaminophen (TYLENOL) 500 MG tablet Take 1,000 mg by mouth every 6 (six) hours as needed for headache.    . albuterol (PROVENTIL HFA;VENTOLIN HFA) 108 (90 Base) MCG/ACT inhaler Inhale 2 puffs into the lungs every 4 (four) hours as needed for wheezing or shortness of breath.    Marland Kitchen aspirin EC 81 MG tablet Take 81 mg by mouth at bedtime.     Marland Kitchen azelastine (ASTELIN) 0.1 % nasal spray USE 2 SPRAYS BID UNTIL DIRECTED TO STOP. USE FOR RUNNY NOSE  0  . Buprenorphine HCl-Naloxone HCl 8-2 MG FILM Place 1 Film under the tongue 3 (three) times daily.    . clopidogrel (PLAVIX) 75 MG tablet Take 1 tablet (75 mg total) by mouth at bedtime. 60 tablet 3  . ibuprofen (ADVIL,MOTRIN) 600 MG tablet Take 600 mg by mouth as needed for pain.  0  . metoprolol tartrate (LOPRESSOR) 25 MG tablet Take 1 tablet (25 mg total) by mouth 2 (two) times daily. 180 tablet 3  . nitroGLYCERIN (NITROSTAT) 0.4 MG SL tablet Place 1 tablet (0.4 mg total) under the tongue every 5 (five) minutes as needed for chest pain. 25 tablet 4  . rosuvastatin (CRESTOR) 20 MG tablet Take 20 mg by mouth daily.    . varenicline (CHANTIX STARTING MONTH PAK) 0.5 MG X 11 & 1 MG X 42 tablet Take one 0.5 mg tablet by mouth  once daily for 3 days, then increase to one 0.5 mg tablet twice daily for 4 days, then increase to one 1 mg tablet twice daily. 53 tablet 0   No current facility-administered medications for this visit.     Allergies:   Azithromycin    Social History:  The patient  reports that she has been smoking cigarettes.  She has a 28.00 pack-year smoking history. She has never used smokeless tobacco. She reports that she does not drink alcohol or use drugs.   Family History:  The patient's family history includes Cancer in her brother; Coronary artery disease in her father and mother.    ROS:  Please see the history of present illness.   Otherwise, review of systems are positive for occasional NTG  use.   All other systems are reviewed and negative.    PHYSICAL EXAM: VS:  BP 124/76   Pulse 68   Ht 4\' 11"  (1.499 m)   Wt 139 lb (63 kg)   SpO2 97%   BMI 28.07 kg/m  , BMI Body mass index is 28.07 kg/m. GEN: Well nourished, well developed, in no acute distress  HEENT: normal  Neck: no JVD, carotid bruits, or masses Cardiac: RRR; no murmurs, rubs, or gallops,no edema ; pulses: 2+ DP pulses bilaterally; 2+ right radial pulse Respiratory:  clear to auscultation bilaterally, normal work of breathing GI: soft, nontender, nondistended, + BS MS: no deformity or atrophy  Skin: warm and dry, no rash Neuro:  Strength and sensation are intact Psych: euthymic mood, full affect    Recent Labs: No results found for requested labs within last 8760 hours.   Lipid Panel    Component Value Date/Time   CHOL 113 06/05/2016 0911   CHOL 85 03/20/2013 0557   TRIG 120 06/05/2016 0911   TRIG 112 03/20/2013 0557   HDL 32 (L) 06/05/2016 0911   HDL 21 (L) 03/20/2013 0557   CHOLHDL 3.5 06/05/2016 0911   VLDL 24 06/05/2016 0911   VLDL 22 03/20/2013 0557   LDLCALC 57 06/05/2016 0911   LDLCALC 42 03/20/2013 0557     Other studies Reviewed: Additional studies/ records that were reviewed today with results  demonstrating: prior cath records reviewed.   ASSESSMENT AND PLAN:  1. CAD: Mild angina, controlled wth medicine.  If hte frequency of her NTG use increases, she will let us know.  I would plan on heart cath if she were to have more frequent symptoms.  Check lipids today for lyperlipidemia. 2. Tobacco abuse: She is trying to cut back.  Currently, she is using Chantix. 3. ?OSA based on past sx of daytime sleepiness: She has follow-up with Dr. Radford Pax regarding her oxygen desaturations. 4. PAD: Mild in the past.  INcrease exercise.  PV studies from 2018 reviewed.  Palpable pulses in feet are reassuring regarding leg circulation.   She needs to increase walking to help her circulation.   Current medicines are reviewed at length with the patient today.  The patient concerns regarding her medicines were addressed.  The following changes have been made:  No change  Labs/ tests ordered today include:  No orders of the defined types were placed in this encounter.   Recommend 150 minutes/week of aerobic exercise Low fat, low carb, high fiber diet recommended  Disposition:   FU in 6 months   Signed, Larae Grooms, MD  09/25/2017 10:54 AM    [facility] [place], [place], [place]  [address] Phone: [phone]; Fax: [phone]

## 2017-09-25 ENCOUNTER — Ambulatory Visit (INDEPENDENT_AMBULATORY_CARE_PROVIDER_SITE_OTHER): Payer: Medicare HMO | Admitting: Interventional Cardiology

## 2017-09-25 ENCOUNTER — Encounter: Payer: Self-pay | Admitting: Interventional Cardiology

## 2017-09-25 ENCOUNTER — Telehealth: Payer: Self-pay

## 2017-09-25 VITALS — BP 124/76 | HR 68 | Ht 59.0 in | Wt 139.0 lb

## 2017-09-25 DIAGNOSIS — E785 Hyperlipidemia, unspecified: Secondary | ICD-10-CM

## 2017-09-25 DIAGNOSIS — Z720 Tobacco use: Secondary | ICD-10-CM | POA: Diagnosis not present

## 2017-09-25 DIAGNOSIS — I2510 Atherosclerotic heart disease of native coronary artery without angina pectoris: Secondary | ICD-10-CM

## 2017-09-25 DIAGNOSIS — R400 Somnolence: Secondary | ICD-10-CM | POA: Diagnosis not present

## 2017-09-25 LAB — COMPREHENSIVE METABOLIC PANEL
A/G RATIO: 1.6 (ref 1.2–2.2)
ALBUMIN: 4.3 g/dL (ref 3.5–4.8)
ALK PHOS: 84 IU/L (ref 39–117)
ALT: 14 IU/L (ref 0–32)
AST: 17 IU/L (ref 0–40)
BILIRUBIN TOTAL: 0.3 mg/dL (ref 0.0–1.2)
BUN / CREAT RATIO: 26 (ref 12–28)
BUN: 21 mg/dL (ref 8–27)
CHLORIDE: 102 mmol/L (ref 96–106)
CO2: 22 mmol/L (ref 20–29)
Calcium: 9.7 mg/dL (ref 8.7–10.3)
Creatinine, Ser: 0.81 mg/dL (ref 0.57–1.00)
GFR calc non Af Amer: 73 mL/min/{1.73_m2} (ref >59)
GFR, EST AFRICAN AMERICAN: 84 mL/min/{1.73_m2} (ref >59)
Globulin, Total: 2.7 g/dL (ref 1.5–4.5)
Glucose: 96 mg/dL (ref 65–99)
POTASSIUM: 5 mmol/L (ref 3.5–5.2)
SODIUM: 139 mmol/L (ref 134–144)
Total Protein: 7 g/dL (ref 6.0–8.5)

## 2017-09-25 LAB — LIPID PANEL
CHOLESTEROL TOTAL: 191 mg/dL (ref 100–199)
Chol/HDL Ratio: 4.2 ratio (ref 0.0–4.4)
HDL: 46 mg/dL (ref >39)
LDL Calculated: 119 mg/dL — ABNORMAL HIGH (ref 0–99)
Triglycerides: 128 mg/dL (ref 0–149)
VLDL CHOLESTEROL CAL: 26 mg/dL (ref 5–40)

## 2017-09-25 NOTE — Telephone Encounter (Signed)
-----   Message from Jettie Booze, MD sent at 09/25/2017  4:43 PM EDT ----- LDL increased. Other labs normal.  Has she been taking the Crestor regularly.  If she has, would increase to 40 mg daily.  If not, she needs to take it regularly.  Repeat lipids and liver in three months.

## 2017-09-25 NOTE — Patient Instructions (Addendum)
Medication Instructions:  Your physician recommends that you continue on your current medications as directed. Please refer to the Current Medication list given to you today.   Labwork: TODAY: CMET, LIPIDS  Testing/Procedures: None ordered  Follow-Up: Your physician wants you to follow-up in: 6 months with Dr. Varanasi. You will receive a reminder letter in the mail two months in advance. If you don't receive a letter, please call our office to schedule the follow-up appointment.   Any Other Special Instructions Will Be Listed Below (If Applicable).     If you need a refill on your cardiac medications before your next appointment, please call your pharmacy.   

## 2017-09-25 NOTE — Telephone Encounter (Signed)
Patient made aware of results. Patient states that she has not been taking her crestor daily. Instructed patient to take her crestor 20 mg QD as prescribed to help lower her LDL and decrease her risk for cardiac event or stroke. Lab appointment made for 7/16. Patient verbalizes understanding.

## 2017-09-28 ENCOUNTER — Telehealth: Payer: Self-pay | Admitting: *Deleted

## 2017-09-28 NOTE — Telephone Encounter (Signed)
ONO completed and report received and scanned.

## 2017-09-28 NOTE — Telephone Encounter (Signed)
-----   Message from Sueanne Margarita, MD sent at 09/28/2017  1:45 PM EDT ----- No oxygen desats on current dose of nighttime O2

## 2017-09-28 NOTE — Telephone Encounter (Signed)
Pt is aware and agreeable to normal results. Patient is aware and agreeable to ONO results having No oxygen desats on current dose of nighttime O2 Patient is agreeable to trearment.

## 2017-10-02 DIAGNOSIS — R69 Illness, unspecified: Secondary | ICD-10-CM | POA: Diagnosis not present

## 2017-10-30 ENCOUNTER — Ambulatory Visit: Payer: Medicare HMO | Admitting: Cardiology

## 2017-10-30 DIAGNOSIS — G4734 Idiopathic sleep related nonobstructive alveolar hypoventilation: Secondary | ICD-10-CM | POA: Insufficient documentation

## 2017-10-31 DIAGNOSIS — F4323 Adjustment disorder with mixed anxiety and depressed mood: Secondary | ICD-10-CM | POA: Diagnosis not present

## 2017-10-31 DIAGNOSIS — R69 Illness, unspecified: Secondary | ICD-10-CM | POA: Diagnosis not present

## 2017-11-01 ENCOUNTER — Encounter: Payer: Self-pay | Admitting: Cardiology

## 2017-11-27 ENCOUNTER — Other Ambulatory Visit: Payer: Medicare HMO

## 2017-11-27 DIAGNOSIS — R69 Illness, unspecified: Secondary | ICD-10-CM | POA: Diagnosis not present

## 2017-11-27 DIAGNOSIS — E785 Hyperlipidemia, unspecified: Secondary | ICD-10-CM | POA: Diagnosis not present

## 2017-11-27 DIAGNOSIS — F4323 Adjustment disorder with mixed anxiety and depressed mood: Secondary | ICD-10-CM | POA: Diagnosis not present

## 2017-11-27 LAB — LIPID PANEL
CHOLESTEROL TOTAL: 191 mg/dL (ref 100–199)
Chol/HDL Ratio: 5.6 ratio — ABNORMAL HIGH (ref 0.0–4.4)
HDL: 34 mg/dL — ABNORMAL LOW (ref >39)
LDL Calculated: 105 mg/dL — ABNORMAL HIGH (ref 0–99)
Triglycerides: 262 mg/dL — ABNORMAL HIGH (ref 0–149)
VLDL Cholesterol Cal: 52 mg/dL — ABNORMAL HIGH (ref 5–40)

## 2017-11-30 ENCOUNTER — Telehealth: Payer: Self-pay | Admitting: Interventional Cardiology

## 2017-11-30 DIAGNOSIS — E785 Hyperlipidemia, unspecified: Secondary | ICD-10-CM

## 2017-11-30 NOTE — Telephone Encounter (Signed)
Called patient and made her aware of results and that her LDL goal is <70. Patient states that she has not been taking her crestor 20 mg QD. She states that she doesn't take it everyday because it makes her tired. Made patient aware that she could try to make changes to her diet, get regular exercise, and take her crestor QD as prescribed or if she felt like she could not tolerate takeing the crestor everyday that we could schedule her in the lipid clinic to discuss other options. Patient states that she wants to try taking the crestor everyday and make dietary changes to see if that helps. Repeat fasting LIPIDS ordered for 01/14/18.

## 2017-11-30 NOTE — Telephone Encounter (Signed)
New Message ° ° °Patient is returning call in reference to her labs. Please call to discuss.  °

## 2017-11-30 NOTE — Telephone Encounter (Signed)
-----   Message from Kathryn Garrett, [facility] sent at 11/29/2017  1:30 PM EDT ----- Pt with history of CAD and LDL not at goal <70. Would see if compliant with rosuvastatin 20mg  daily. Of note she does have a history of intolerance to atorvastatin. If compliant without issues would recommend increase rosuvastatin to 40mg  daily and start ezetimibe 10mg  daily to bring LDL to goal. Recheck lipid and hepatic panel in 2-3 months. If unable to tolerate rosuvastatin would schedule in lipid clinic for discussion of PCSK9i/other options.

## 2017-12-21 DIAGNOSIS — R69 Illness, unspecified: Secondary | ICD-10-CM | POA: Diagnosis not present

## 2017-12-21 DIAGNOSIS — R51 Headache: Secondary | ICD-10-CM | POA: Diagnosis not present

## 2018-01-15 ENCOUNTER — Other Ambulatory Visit: Payer: Medicare HMO | Admitting: *Deleted

## 2018-01-15 DIAGNOSIS — E785 Hyperlipidemia, unspecified: Secondary | ICD-10-CM

## 2018-01-15 LAB — LIPID PANEL
CHOLESTEROL TOTAL: 91 mg/dL — AB (ref 100–199)
Chol/HDL Ratio: 2.8 ratio (ref 0.0–4.4)
HDL: 33 mg/dL — ABNORMAL LOW (ref >39)
LDL CALC: 36 mg/dL (ref 0–99)
Triglycerides: 108 mg/dL (ref 0–149)
VLDL Cholesterol Cal: 22 mg/dL (ref 5–40)

## 2018-01-22 DIAGNOSIS — R69 Illness, unspecified: Secondary | ICD-10-CM | POA: Diagnosis not present

## 2018-01-22 DIAGNOSIS — F4323 Adjustment disorder with mixed anxiety and depressed mood: Secondary | ICD-10-CM | POA: Diagnosis not present

## 2018-02-11 ENCOUNTER — Telehealth: Payer: Self-pay | Admitting: Internal Medicine

## 2018-02-11 NOTE — Telephone Encounter (Signed)
Patient will need an OV.  No recent OV with cardiology and patient is on Plavix.  I reached the patient, but then was cut off. I attempted to reach her back but no answer.  I am unable to leave a message because her VM is full.

## 2018-02-12 NOTE — Telephone Encounter (Signed)
Patient notified she will need appt.  She is scheduled for 02/19/18 with Tye Savoy RNP

## 2018-02-15 DIAGNOSIS — F4323 Adjustment disorder with mixed anxiety and depressed mood: Secondary | ICD-10-CM | POA: Diagnosis not present

## 2018-02-15 DIAGNOSIS — R69 Illness, unspecified: Secondary | ICD-10-CM | POA: Diagnosis not present

## 2018-02-18 ENCOUNTER — Other Ambulatory Visit: Payer: Self-pay | Admitting: Interventional Cardiology

## 2018-02-18 MED ORDER — CLOPIDOGREL BISULFATE 75 MG PO TABS: 75.0000 mg | ORAL_TABLET | Freq: Every day | ORAL | 2 refills | Status: DC

## 2018-02-19 ENCOUNTER — Ambulatory Visit: Payer: Medicare HMO | Admitting: Nurse Practitioner

## 2018-02-23 ENCOUNTER — Telehealth: Payer: Medicare HMO | Admitting: Nurse Practitioner

## 2018-02-23 DIAGNOSIS — R059 Cough, unspecified: Secondary | ICD-10-CM

## 2018-02-23 DIAGNOSIS — R05 Cough: Secondary | ICD-10-CM

## 2018-02-23 MED ORDER — DOXYCYCLINE HYCLATE 100 MG PO TABS: 100.0000 mg | ORAL_TABLET | Freq: Two times a day (BID) | ORAL | 0 refills | Status: DC

## 2018-02-23 NOTE — Progress Notes (Signed)
We are sorry that you are not feeling well.  Here is how we plan to help!  Based on your presentation I believe you most likely have A cough due to bacteria.  When patients have a fever and a productive cough with a change in color or increased sputum production, we are concerned about bacterial bronchitis.  If left untreated it can progress to pneumonia.  If your symptoms do not improve with your treatment plan it is important that you contact your provider.   I have prescribed Doxycycline 100 mg twice a day for 7 days     In addition you may use A non-prescription cough medication called Mucinex DM: take 2 tablets every 12 hours.   From your responses in the eVisit questionnaire you describe inflammation in the upper respiratory tract which is causing a significant cough.  This is commonly called Bronchitis and has four common causes:    Allergies  Viral Infections  Acid Reflux  Bacterial Infection Allergies, viruses and acid reflux are treated by controlling symptoms or eliminating the cause. An example might be a cough caused by taking certain blood pressure medications. You stop the cough by changing the medication. Another example might be a cough caused by acid reflux. Controlling the reflux helps control the cough.  USE OF BRONCHODILATOR ("RESCUE") INHALERS: There is a risk from using your bronchodilator too frequently.  The risk is that over-reliance on a medication which only relaxes the muscles surrounding the breathing tubes can reduce the effectiveness of medications prescribed to reduce swelling and congestion of the tubes themselves.  Although you feel brief relief from the bronchodilator inhaler, your asthma may actually be worsening with the tubes becoming more swollen and filled with mucus.  This can delay other crucial treatments, such as oral steroid medications. If you need to use a bronchodilator inhaler daily, several times per day, you should discuss this with your  provider.  There are probably better treatments that could be used to keep your asthma under control.     HOME CARE . Only take medications as instructed by your medical team. . Complete the entire course of an antibiotic. . Drink plenty of fluids and get plenty of rest. . Avoid close contacts especially the very young and the elderly . Cover your mouth if you cough or cough into your sleeve. . Always remember to wash your hands . A steam or ultrasonic humidifier can help congestion.   GET HELP RIGHT AWAY IF: . You develop worsening fever. . You become short of breath . You cough up blood. . Your symptoms persist after you have completed your treatment plan MAKE SURE YOU   Understand these instructions.  Will watch your condition.  Will get help right away if you are not doing well or get worse.  Your e-visit answers were reviewed by a board certified advanced clinical practitioner to complete your personal care plan.  Depending on the condition, your plan could have included both over the counter or prescription medications. If there is a problem please reply  once you have received a response from your provider. Your safety is important to us.  If you have drug allergies check your prescription carefully.    You can use MyChart to ask questions about today's visit, request a non-urgent call back, or ask for a work or school excuse for 24 hours related to this e-Visit. If it has been greater than 24 hours you will need to follow up with your provider,   or enter a new e-Visit to address those concerns. You will get an e-mail in the next two days asking about your experience.  I hope that your e-visit has been valuable and will speed your recovery. Thank you for using e-visits.   

## 2018-03-05 ENCOUNTER — Encounter: Payer: Self-pay | Admitting: Primary Care

## 2018-03-05 ENCOUNTER — Ambulatory Visit (INDEPENDENT_AMBULATORY_CARE_PROVIDER_SITE_OTHER): Payer: Medicare HMO | Admitting: Primary Care

## 2018-03-05 VITALS — BP 130/70 | HR 60 | Temp 98.1°F | Ht 59.0 in | Wt 141.8 lb

## 2018-03-05 DIAGNOSIS — I10 Essential (primary) hypertension: Secondary | ICD-10-CM | POA: Diagnosis not present

## 2018-03-05 DIAGNOSIS — Z720 Tobacco use: Secondary | ICD-10-CM

## 2018-03-05 DIAGNOSIS — F1111 Opioid abuse, in remission: Secondary | ICD-10-CM

## 2018-03-05 DIAGNOSIS — Z23 Encounter for immunization: Secondary | ICD-10-CM

## 2018-03-05 DIAGNOSIS — I25110 Atherosclerotic heart disease of native coronary artery with unstable angina pectoris: Secondary | ICD-10-CM

## 2018-03-05 DIAGNOSIS — I739 Peripheral vascular disease, unspecified: Secondary | ICD-10-CM

## 2018-03-05 DIAGNOSIS — R911 Solitary pulmonary nodule: Secondary | ICD-10-CM

## 2018-03-05 DIAGNOSIS — J449 Chronic obstructive pulmonary disease, unspecified: Secondary | ICD-10-CM | POA: Diagnosis not present

## 2018-03-05 DIAGNOSIS — E785 Hyperlipidemia, unspecified: Secondary | ICD-10-CM

## 2018-03-05 DIAGNOSIS — R69 Illness, unspecified: Secondary | ICD-10-CM | POA: Diagnosis not present

## 2018-03-05 MED ORDER — BUDESONIDE-FORMOTEROL FUMARATE 160-4.5 MCG/ACT IN AERO
1.0000 | INHALATION_SPRAY | Freq: Two times a day (BID) | RESPIRATORY_TRACT | 0 refills | Status: DC
Start: 2018-03-05 — End: 2020-09-23

## 2018-03-05 NOTE — Progress Notes (Signed)
Subjective:    Patient ID: Kathryn Garrett, female    DOB: 04-07-1946, 72 y.o.   MRN: [id number]  HPI  Kathryn Garrett is a 72 year old female who presents today to establish care and discuss the problems mentioned below. Will obtain old records. Her last physical was years ago.   1) CAD with stent placement/Unstable Angina/PAD/Hypertension: Currently managed on clopidogrel 75 mg, metoprolol tartrate 25 mg BID, rosuvastatin 20 mg, nitroglycerin PRN.   She is currently following with cardiology with her last visit being in May 2019. She has had use of her nitrocycline two weeks ago.   2) Chronic Pain: Currently managed on buprenorphine-naloxone 8-2 mg tablets. She is currently following with Dr. Koleen Nimrod in [place]. She was once addicted to opoid pain medications, she's had no opioids in years.   3) COPD: Diagnosed around 2018. She is currently using her albuterol inhaler once daily three to four times weekly for symptoms of shortness of breath and wheezing. She smokes cigarettes and is down to 1/2 PPD. She's smoked since the age of 72. She has never been managed on an ICS or LABA.   Review of Systems  Respiratory: Positive for shortness of breath and wheezing. Negative for cough.   Cardiovascular: Negative for chest pain.  Musculoskeletal: Positive for arthralgias.  Neurological: Negative for headaches.       Past Medical History:  Diagnosis Date  . Anemia, mild   . Arthritis    "qwhere" (02/10/2016)  . CAD (coronary artery disease)    a. VF arrest 01/2009/CAD with inferoposterior MI s/p aspiration thrombectomy/BMS of RCA at that time. b. ISR of BMS s/p DES to RCA 06/2010 with moderate LM/LAD disease not significant by FFR. c. s/p angiosculpt PTCA to prox RCA 01/2016 for ISR. d. Aggressive PTCA to prox RCA for ISR 05/2016.  . Cardiac arrest ([place]) 01/2009   a. in setting of inf-post STEMI 01/2009 (VF).  . Chest pain 06/04/2016  . Chronic bronchitis ([place])   . Chronic combined systolic and  diastolic CHF (congestive heart failure) ([place])    a. EF 40-45% by cath 2010. b. 60-65% with grade 1 DD by echo 09/2015  . COPD (chronic obstructive pulmonary disease) ([place])   . Hyperlipidemia 01/28/2016  . Hyperlipidemia LDL goal <70 01/28/2016  . Hypertension   . Lung mass   . MI (myocardial infarction) ([place]) 01/2009  . Narcotic abuse ([place])    pt now taking Suboxone tid  . Pleural effusion on left 09/24/2015  . Pneumonia 06/2014; 09/2015  . Pre-diabetes   . Pulmonary nodule   . PVD (peripheral vascular disease) ([facility])    mild atherosclerosis of infrarenal aorta, 25% ostial left renal artery stenosis, 50% ostial right common iliac by cath 2010  . S/P PTCA (percutaneous transluminal coronary angioplasty) 02/10/16 to RCA lesion for in stent restenois 02/11/2016  . Subclavian artery stenosis ([facility])    a. >50% by duplex 05/2016.  . Tobacco abuse   . Unstable angina [facility])      Social History   Socioeconomic History  . Marital status: Legally Separated    Spouse name: Not on file  . Number of children: Not on file  . Years of education: Not on file  . Highest education level: Not on file  Occupational History  . Not on file  Social Needs  . Financial resource strain: Not on file  . Food insecurity:    Worry: Not on file    Inability: Not on file  .  Transportation needs:    Medical: Not on file    Non-medical: Not on file  Tobacco Use  . Smoking status: Current Every Day Smoker    Packs/day: 0.50    Years: 56.00    Pack years: 28.00    Types: Cigarettes  . Smokeless tobacco: Never Used  Substance and Sexual Activity  . Alcohol use: No    Alcohol/week: 0.0 standard drinks  . Drug use: No  . Sexual activity: Not Currently  Lifestyle  . Physical activity:    Days per week: Not on file    Minutes per session: Not on file  . Stress: Not on file  Relationships  . Social connections:    Talks on phone: Not on file    Gets together: Not on file    Attends religious service: Not on  file    Active member of club or organization: Not on file    Attends meetings of clubs or organizations: Not on file    Relationship status: Not on file  . Intimate partner violence:    Fear of current or ex partner: Not on file    Emotionally abused: Not on file    Physically abused: Not on file    Forced sexual activity: Not on file  Other Topics Concern  . Not on file  Social History Narrative  . Not on file    Past Surgical History:  Procedure Laterality Date  . ABDOMINAL HYSTERECTOMY    . ANKLE FRACTURE SURGERY Right 2000s  . CARDIAC CATHETERIZATION N/A 02/10/2016   Procedure: Left Heart Cath and Coronary Angiography;  Surgeon: Jettie Booze, MD;  Location: [facility];  Service: Cardiovascular;  Laterality: N/A;  . CARDIAC CATHETERIZATION N/A 02/10/2016   Procedure: Coronary Balloon Angioplasty;  Surgeon: Jettie Booze, MD;  Location: [facility];  Service: Cardiovascular;  Laterality: N/A;  instent RCA  . CARDIAC CATHETERIZATION N/A 06/05/2016   Procedure: Left Heart Cath and Coronary Angiography;  Surgeon: Leonie Man, MD;  Location: [facility];  Service: Cardiovascular;  Laterality: N/A;  . CARDIAC CATHETERIZATION N/A 06/05/2016   Procedure: Coronary Balloon Angioplasty;  Surgeon: Leonie Man, MD;  Location: [facility];  Service: Cardiovascular;  Laterality: N/A;  . CHEST TUBE INSERTION N/A 10/08/2015   Procedure: PLEURX CATH REMOVAL;  Surgeon: [name], MD;  Location: [facility];  Service: Thoracic;  Laterality: N/A;  . CORONARY ANGIOPLASTY WITH STENT PLACEMENT  01/2009; 2012  . FRACTURE SURGERY    . VIDEO ASSISTED THORACOSCOPY (VATS)/THOROCOTOMY Left 09/29/2015   Procedure: PREOP BRONCHOSCOPY, LEFT THORACOSCOPY, POSSIBLE THORACOTOMY, PLEURAL BIOPSY, TALC;  Surgeon: [name], MD;  Location: [facility];  Service: General;  Laterality: Left;    Family History  Problem Relation Age of Onset  . Coronary artery disease Father     . Coronary artery disease Mother   . Cancer Brother     Allergies  Allergen Reactions  . Azithromycin Rash    Current Outpatient Medications on File Prior to Visit  Medication Sig Dispense Refill  . acetaminophen (TYLENOL) 500 MG tablet Take 1,000 mg by mouth every 6 (six) hours as needed for headache.    . albuterol (PROVENTIL HFA;VENTOLIN HFA) 108 (90 Base) MCG/ACT inhaler Inhale 2 puffs into the lungs every 4 (four) hours as needed for wheezing or shortness of breath.    Marland Kitchen aspirin EC 81 MG tablet Take 81 mg by mouth at bedtime.     Marland Kitchen azelastine (  ASTELIN) 0.1 % nasal spray USE 2 SPRAYS BID UNTIL DIRECTED TO STOP. USE FOR RUNNY NOSE  0  . Buprenorphine HCl-Naloxone HCl 8-2 MG FILM Place 1 Film under the tongue 3 (three) times daily.    . clopidogrel (PLAVIX) 75 MG tablet Take 1 tablet (75 mg total) by mouth at bedtime. 90 tablet 2  . fluticasone (FLONASE) 50 MCG/ACT nasal spray Place 1 spray into both nostrils daily.    Marland Kitchen ibuprofen (ADVIL,MOTRIN) 600 MG tablet Take 600 mg by mouth as needed for pain.  0  . metoprolol tartrate (LOPRESSOR) 25 MG tablet Take 1 tablet (25 mg total) by mouth 2 (two) times daily. 180 tablet 3  . nitroGLYCERIN (NITROSTAT) 0.4 MG SL tablet Place 1 tablet (0.4 mg total) under the tongue every 5 (five) minutes as needed for chest pain. 25 tablet 4  . rosuvastatin (CRESTOR) 20 MG tablet Take 20 mg by mouth daily.     No current facility-administered medications on file prior to visit.     BP 130/70 (BP Location: Right Arm, Patient Position: Sitting, Cuff Size: Normal)   Pulse 60   Temp 98.1 F (36.7 C) (Oral)   Ht 4\' 11"  (1.499 m)   Wt 141 lb 12.8 oz (64.3 kg)   SpO2 100%   BMI 28.64 kg/m    Objective:   Physical Exam  Constitutional: She appears well-nourished.  Neck: Neck supple.  Cardiovascular: Normal rate and regular rhythm.  Respiratory: Effort normal and breath sounds normal.  Skin: Skin is warm and dry.  Psychiatric: She has a normal mood  and affect.           Assessment & Plan:

## 2018-03-05 NOTE — Assessment & Plan Note (Signed)
Stable in the office today, continue metoprolol tartrate BID.

## 2018-03-05 NOTE — Assessment & Plan Note (Signed)
Following with cardiology. S/p stent placement. Last use of nitroglycerin 2 weeks ago with improvement in symptoms. Continue clopidogrel, statin, metoprolol.

## 2018-03-05 NOTE — Patient Instructions (Signed)
Start the budesonide-formoterol (Symbicort) inhaler for COPD symptoms. Inhale 1 puff into the lungs twice daily.   Use the albuterol/ventolin inhaler only as needed for breakthrough shortness of breath/wheezing.  Continue to follow up with Dr. Koleen Nimrod and your cardiologist.  Please schedule a physical with me and a Wellness Visit with our nurse in 1-2 months.   It was a pleasure to meet you today! Please don't hesitate to call or message me with any questions. Welcome to [facility]!

## 2018-03-05 NOTE — Assessment & Plan Note (Signed)
Lipid panel in September 2019 with LDL below goal. Continue rosuvastatin.

## 2018-03-05 NOTE — Assessment & Plan Note (Addendum)
Evaluated by vascular last year, work up negative for blockages. Discussed to quit smoking, she is working on cutting back. Continue statin and clopidogrel.

## 2018-03-05 NOTE — Assessment & Plan Note (Signed)
Managed on Suboxone and is followed by Dr. Koleen Nimrod in [place].

## 2018-03-05 NOTE — Assessment & Plan Note (Signed)
Inappropriate use of albuterol inhaler, more than 3 times weekly on average. Exam today overall unremarkable.   Trial of Symbicort 160 mcg, 1 puff BID. Continue albuterol PRN. She will update in 3-4 weeks. Follow up in 1-2 months.

## 2018-03-05 NOTE — Assessment & Plan Note (Signed)
She is working on cutting back tobacco abuse, encouraged to quit.

## 2018-03-05 NOTE — Assessment & Plan Note (Signed)
CT chest from 2017 reviewed and confirmed benign lung nodule. Consider lung cancer screening testing during upcoming visit.

## 2018-03-11 ENCOUNTER — Telehealth: Payer: Medicare HMO | Admitting: Physician Assistant

## 2018-03-11 ENCOUNTER — Other Ambulatory Visit: Payer: Self-pay | Admitting: Physician Assistant

## 2018-03-11 DIAGNOSIS — J208 Acute bronchitis due to other specified organisms: Principal | ICD-10-CM

## 2018-03-11 DIAGNOSIS — B9689 Other specified bacterial agents as the cause of diseases classified elsewhere: Secondary | ICD-10-CM

## 2018-03-11 DIAGNOSIS — Z8709 Personal history of other diseases of the respiratory system: Secondary | ICD-10-CM

## 2018-03-11 MED ORDER — LEVOFLOXACIN 500 MG PO TABS: 500.0000 mg | ORAL_TABLET | Freq: Every day | ORAL | 0 refills | Status: DC

## 2018-03-11 MED ORDER — FLUTICASONE PROPIONATE 50 MCG/ACT NA SUSP: 2.0000 | Freq: Every day | NASAL | 0 refills | Status: DC

## 2018-03-11 MED ORDER — PREDNISONE 10 MG PO TABS
ORAL_TABLET | ORAL | 0 refills | Status: DC
Start: 2018-03-11 — End: 2018-09-17

## 2018-03-11 MED ORDER — CETIRIZINE HCL 10 MG PO TABS
10.0000 mg | ORAL_TABLET | Freq: Every day | ORAL | 0 refills | Status: DC
Start: 2018-03-11 — End: 2024-03-12

## 2018-03-11 NOTE — Progress Notes (Signed)
If your cough persists or if you develop symptoms such as shortness of breath or wheezing, or if your demand to your inhaler use is increased,please follow up with your primary care provider to rule out COPD exacerbation.

## 2018-03-11 NOTE — Progress Notes (Signed)
We are sorry that you are not feeling well.  Here is how we plan to help!  Based on your presentation I believe you most likely have A cough due to bacteria.  When patients have a fever and a productive cough with a change in color or increased sputum production, we are concerned about bacterial bronchitis.  If left untreated it can progress to pneumonia.  If your symptoms do not improve with your treatment plan it is important that you contact your provider.   I have prescribed Levofloxacin 500 mg daily for 7 days    In addition you may use A prescription cough medication called Tessalon Perles 100mg . You may take 1-2 capsules every 8 hours as needed for your cough.   To help with allergic symptoms, take prescribed Zyrtec once daily and Flonase, 2 sprays in each nostril once daily.    I have also prescribed Prednisone 5 mg daily for 6 days (see taper instructions below)  Directions for 6 day taper: Day 1: 2 tablets before breakfast, 1 after both lunch & dinner and 2 at bedtime Day 2: 1 tab before breakfast, 1 after both lunch & dinner and 2 at bedtime Day 3: 1 tab at each meal & 1 at bedtime Day 4: 1 tab at breakfast, 1 at lunch, 1 at bedtime Day 5: 1 tab at breakfast & 1 tab at bedtime Day 6: 1 tab at breakfast   From your responses in the eVisit questionnaire you describe inflammation in the upper respiratory tract which is causing a significant cough.  This is commonly called Bronchitis and has four common causes:    Allergies  Viral Infections  Acid Reflux  Bacterial Infection Allergies, viruses and acid reflux are treated by controlling symptoms or eliminating the cause. An example might be a cough caused by taking certain blood pressure medications. You stop the cough by changing the medication. Another example might be a cough caused by acid reflux. Controlling the reflux helps control the cough.  USE OF BRONCHODILATOR ("RESCUE") INHALERS: There is a risk from using your  bronchodilator too frequently.  The risk is that over-reliance on a medication which only relaxes the muscles surrounding the breathing tubes can reduce the effectiveness of medications prescribed to reduce swelling and congestion of the tubes themselves.  Although you feel brief relief from the bronchodilator inhaler, your asthma may actually be worsening with the tubes becoming more swollen and filled with mucus.  This can delay other crucial treatments, such as oral steroid medications. If you need to use a bronchodilator inhaler daily, several times per day, you should discuss this with your provider.  There are probably better treatments that could be used to keep your asthma under control.     HOME CARE . Only take medications as instructed by your medical team. . Complete the entire course of an antibiotic. . Drink plenty of fluids and get plenty of rest. . Avoid close contacts especially the very young and the elderly . Cover your mouth if you cough or cough into your sleeve. . Always remember to wash your hands . A steam or ultrasonic humidifier can help congestion.   GET HELP RIGHT AWAY IF: . You develop worsening fever. . You become short of breath . You cough up blood. . Your symptoms persist after you have completed your treatment plan MAKE SURE YOU   Understand these instructions.  Will watch your condition.  Will get help right away if you are not doing well  or get worse.  Your e-visit answers were reviewed by a board certified advanced clinical practitioner to complete your personal care plan.  Depending on the condition, your plan could have included both over the counter or prescription medications. If there is a problem please reply  once you have received a response from your provider. Your safety is important to [place].  If you have drug allergies check your prescription carefully.    You can use MyChart to ask questions about today's visit, request a non-urgent call  back, or ask for a work or school excuse for 24 hours related to this e-Visit. If it has been greater than 24 hours you will need to follow up with your provider, or enter a new e-Visit to address those concerns. You will get an e-mail in the next two days asking about your experience.  I hope that your e-visit has been valuable and will speed your recovery. Thank you for using e-visits.

## 2018-03-12 ENCOUNTER — Telehealth: Payer: Self-pay | Admitting: Interventional Cardiology

## 2018-03-12 DIAGNOSIS — Z01419 Encounter for gynecological examination (general) (routine) without abnormal findings: Secondary | ICD-10-CM | POA: Diagnosis not present

## 2018-03-12 DIAGNOSIS — Z1231 Encounter for screening mammogram for malignant neoplasm of breast: Secondary | ICD-10-CM | POA: Diagnosis not present

## 2018-03-12 MED ORDER — NITROGLYCERIN 0.4 MG SL SUBL: 0.4000 mg | SUBLINGUAL_TABLET | SUBLINGUAL | 4 refills | Status: DC | PRN

## 2018-03-12 NOTE — Telephone Encounter (Signed)
New Message   Patients daughter Amy is calling on her behalf. She states that her mother called her aunt who in turned called 45 because she thought she was having a heart attack. But when the ambulance arrived she refused to go to the ER. She states at this point her mom is not answering the phone and her aunt is on the way to her moms house. She wants her mom to be seen right away. She could not really answer any questions about symptoms other than that her mom took some nitroglycerin this morning about 4am when she called for the ambulance. I did schedule an appointment for tomorrow at 8:30am with Ermalinda Barrios.

## 2018-03-12 NOTE — Telephone Encounter (Signed)
Returned call to Amy. Made her aware that she is not on the patient's DPR. Listened to the information provided below:  Amy states that the patient called her sister at 4 AM this morning complaining of chest pain. She states that she told her sister she felt like she was having a heart attack. They called 911. The patient took NTG x 2 and an ASA. She states that the patient's pain subsided by the time the ambulance got there so she would not go to the hospital. She states that her BP was very high. She states that the operator scheduled an appointment for her to come in tomorrow morning at 8:30 AM see an APP.   Let Amy know that I will reach out to the patient.

## 2018-03-12 NOTE — Telephone Encounter (Signed)
Multiple attempts have been made to reach the patient, but there was no answer and VM is full. Will try again later.

## 2018-03-12 NOTE — Progress Notes (Signed)
Cardiology Office Note    Date:  03/13/2018   ID:  EMMETT ARNTZ, DOB 1946/03/05, MRN [id number]  PCP:  Pleas Koch, NP  Cardiologist: Larae Grooms, MD EPS: None  Chief Complaint  Patient presents with  . Chest Pain    History of Present Illness:  Kathryn Garrett is a 72 y.o. female with history of CAD status post inferior MI treated with BMS to the RCA in 2010, DES to the proximal to mid RCA 06/2010 for in-stent restenosis of the BMS, moderate left main and LAD disease which was not significant.  Unstable angina 01/2016 cath showing ISR of RCA which she underwent balloon angioplasty of the RCA.  Plavix restarted.  Unstable angina 06/04/2016 with mild ST depression negative troponins found to have in-stent restenosis of the proximal RCA treated with PTCA with scoring balloon.  Repeat FFR on ostial LAD confirms this was not significant.  Also has hypertension, HLD, COPD with ongoing tobacco abuse.  Last saw Dr. Irish Lack 09/25/2017 and had mild angina.  He said if the frequency of her nitroglycerin use increases he recommended the patient have a cardiac catheterization.  Patient was placed on steroids for URI and took 6 tablets along with levaquin on an empty stomach with coffee. She awaked 4:30 am with burning chest discomfort and sweaty and nausea. No arm pain which she always has with her angina. Started easing before she took a NTG. Has not had any recent angina or nitroglycerin use.  Continues to smoke a half a pack per day.  Trying to quit with her sister.  Past Medical History:  Diagnosis Date  . Anemia, mild   . Arthritis    "qwhere" (02/10/2016)  . CAD (coronary artery disease)    a. VF arrest 01/2009/CAD with inferoposterior MI s/p aspiration thrombectomy/BMS of RCA at that time. b. ISR of BMS s/p DES to RCA 06/2010 with moderate LM/LAD disease not significant by FFR. c. s/p angiosculpt PTCA to prox RCA 01/2016 for ISR. d. Aggressive PTCA to prox RCA for ISR 05/2016.    . Cardiac arrest ([place]) 01/2009   a. in setting of inf-post STEMI 01/2009 (VF).  . Chest pain 06/04/2016  . Chronic bronchitis ([place])   . Chronic combined systolic and diastolic CHF (congestive heart failure) ([place])    a. EF 40-45% by cath 2010. b. 60-65% with grade 1 DD by echo 09/2015  . COPD (chronic obstructive pulmonary disease) ([place])   . Hyperlipidemia 01/28/2016  . Hyperlipidemia LDL goal <70 01/28/2016  . Hypertension   . Lung mass   . MI (myocardial infarction) ([place]) 01/2009  . Narcotic abuse ([place])    pt now taking Suboxone tid  . Pleural effusion on left 09/24/2015  . Pneumonia 06/2014; 09/2015  . Pre-diabetes   . Pulmonary nodule   . PVD (peripheral vascular disease) (H[facility])    mild atherosclerosis of infrarenal aorta, 25% ostial left renal artery stenosis, 50% ostial right common iliac by cath 2010  . S/P PTCA (percutaneous transluminal coronary angioplasty) 02/10/16 to RCA lesion for in stent restenois 02/11/2016  . Subclavian artery stenosis (H[facility])    a. >50% by duplex 05/2016.  . Tobacco abuse   . Unstable angina [facility])     Past Surgical History:  Procedure Laterality Date  . ABDOMINAL HYSTERECTOMY    . ANKLE FRACTURE SURGERY Right 2000s  . CARDIAC CATHETERIZATION N/A 02/10/2016   Procedure: Left Heart Cath and Coronary Angiography;  Surgeon: Jettie Booze, MD;  Location: [facility]  INVASIVE CV LAB;  Service: Cardiovascular;  Laterality: N/A;  . CARDIAC CATHETERIZATION N/A 02/10/2016   Procedure: Coronary Balloon Angioplasty;  Surgeon: Jettie Booze, MD;  Location: [place] CV LAB;  Service: Cardiovascular;  Laterality: N/A;  instent RCA  . CARDIAC CATHETERIZATION N/A 06/05/2016   Procedure: Left Heart Cath and Coronary Angiography;  Surgeon: Leonie Man, MD;  Location: [place] CV LAB;  Service: Cardiovascular;  Laterality: N/A;  . CARDIAC CATHETERIZATION N/A 06/05/2016   Procedure: Coronary Balloon Angioplasty;  Surgeon: Leonie Man, MD;  Location: [place] CV  LAB;  Service: Cardiovascular;  Laterality: N/A;  . CHEST TUBE INSERTION N/A 10/08/2015   Procedure: PLEURX CATH REMOVAL;  Surgeon: [name], MD;  Location: [facility] ORS;  Service: Thoracic;  Laterality: N/A;  . CORONARY ANGIOPLASTY WITH STENT PLACEMENT  01/2009; 2012  . FRACTURE SURGERY    . VIDEO ASSISTED THORACOSCOPY (VATS)/THOROCOTOMY Left 09/29/2015   Procedure: PREOP BRONCHOSCOPY, LEFT THORACOSCOPY, POSSIBLE THORACOTOMY, PLEURAL BIOPSY, TALC;  Surgeon: [name], MD;  Location: [facility] ORS;  Service: General;  Laterality: Left;    Current Medications: Current Meds  Medication Sig  . acetaminophen (TYLENOL) 500 MG tablet Take 1,000 mg by mouth every 6 (six) hours as needed for headache.  . albuterol (PROVENTIL HFA;VENTOLIN HFA) 108 (90 Base) MCG/ACT inhaler Inhale 2 puffs into the lungs every 4 (four) hours as needed for wheezing or shortness of breath.  Marland Kitchen aspirin EC 81 MG tablet Take 81 mg by mouth at bedtime.   Marland Kitchen azelastine (ASTELIN) 0.1 % nasal spray USE 2 SPRAYS BID UNTIL DIRECTED TO STOP. USE FOR RUNNY NOSE  . budesonide-formoterol (SYMBICORT) 160-4.5 MCG/ACT inhaler Inhale 1 puff into the lungs 2 (two) times daily.  . Buprenorphine HCl-Naloxone HCl 8-2 MG FILM Place 1 Film under the tongue 3 (three) times daily.  . cetirizine (ZYRTEC) 10 MG tablet Take 1 tablet (10 mg total) by mouth daily.  . clopidogrel (PLAVIX) 75 MG tablet Take 1 tablet (75 mg total) by mouth at bedtime.  . fluticasone (FLONASE) 50 MCG/ACT nasal spray Place 1 spray into both nostrils daily.  Marland Kitchen ibuprofen (ADVIL,MOTRIN) 600 MG tablet Take 600 mg by mouth as needed for pain.  Marland Kitchen levofloxacin (LEVAQUIN) 500 MG tablet Take 1 tablet (500 mg total) by mouth daily.  . metoprolol tartrate (LOPRESSOR) 25 MG tablet Take 1 tablet (25 mg total) by mouth 2 (two) times daily.  . nitroGLYCERIN (NITROSTAT) 0.4 MG SL tablet Place 1 tablet (0.4 mg total) under the tongue every 5 (five) minutes as needed for chest pain.  . predniSONE  (DELTASONE) 10 MG tablet Take 6 pills day one, 5 day two, 4 day three, 3 day four, 2 day five, 1 pill day six  . rosuvastatin (CRESTOR) 20 MG tablet Take 20 mg by mouth daily.     Allergies:   Azithromycin   Social History   Socioeconomic History  . Marital status: Legally Separated    Spouse name: Not on file  . Number of children: Not on file  . Years of education: Not on file  . Highest education level: Not on file  Occupational History  . Not on file  Social Needs  . Financial resource strain: Not on file  . Food insecurity:    Worry: Not on file    Inability: Not on file  . Transportation needs:    Medical: Not on file    Non-medical: Not on file  Tobacco Use  . Smoking status: Current Every  Day Smoker    Packs/day: 0.50    Years: 56.00    Pack years: 28.00    Types: Cigarettes  . Smokeless tobacco: Never Used  Substance and Sexual Activity  . Alcohol use: No    Alcohol/week: 0.0 standard drinks  . Drug use: No  . Sexual activity: Not Currently  Lifestyle  . Physical activity:    Days per week: Not on file    Minutes per session: Not on file  . Stress: Not on file  Relationships  . Social connections:    Talks on phone: Not on file    Gets together: Not on file    Attends religious service: Not on file    Active member of club or organization: Not on file    Attends meetings of clubs or organizations: Not on file    Relationship status: Not on file  Other Topics Concern  . Not on file  Social History Narrative  . Not on file     Family History:  The patient's family history includes Cancer in her brother; Coronary artery disease in her father and mother; Early death in her father; Heart attack in her father and mother; Hyperlipidemia in her father and mother.   ROS:   Please see the history of present illness.    Review of Systems  Constitution: Negative.  HENT: Negative.   Eyes: Negative.   Cardiovascular: Positive for dyspnea on exertion.    Respiratory: Positive for cough and wheezing.   Hematologic/Lymphatic: Negative.   Musculoskeletal: Positive for back pain and myalgias. Negative for joint pain.  Gastrointestinal: Negative.   Genitourinary: Negative.   Neurological: Negative.    All other systems reviewed and are negative.   PHYSICAL EXAM:   VS:  BP (!) 130/98   Pulse 88   Ht '4\' 11"'$  (1.499 m)   Wt 138 lb 12.8 oz (63 kg)   SpO2 97%   BMI 28.03 kg/m   Physical Exam  GEN: Well nourished, well developed, in no acute distress  Neck: no JVD, carotid bruits, or masses Cardiac:RRR; no murmurs, rubs, or gallops  Respiratory:  clear to auscultation bilaterally, normal work of breathing GI: soft, nontender, nondistended, + BS Ext: without cyanosis, clubbing, or edema, Good distal pulses bilaterally Neuro:  Alert and Oriented x 3 Psych: euthymic mood, full affect  Wt Readings from Last 3 Encounters:  03/13/18 138 lb 12.8 oz (63 kg)  03/05/18 141 lb 12.8 oz (64.3 kg)  09/25/17 139 lb (63 kg)      Studies/Labs Reviewed:   EKG:  EKG is ordered today.  The ekg ordered today demonstrates normal sinus rhythm nonspecific ST-T wave changes, no acute change from prior tracing and EKG reviewed from EMS and shows no acute change  Recent Labs: 09/25/2017: ALT 14; BUN 21; Creatinine, Ser 0.81; Potassium 5.0; Sodium 139   Lipid Panel    Component Value Date/Time   CHOL 91 (L) 01/15/2018 1110   CHOL 85 03/20/2013 0557   TRIG 108 01/15/2018 1110   TRIG 112 03/20/2013 0557   HDL 33 (L) 01/15/2018 1110   HDL 21 (L) 03/20/2013 0557   CHOLHDL 2.8 01/15/2018 1110   CHOLHDL 3.5 06/05/2016 0911   VLDL 24 06/05/2016 0911   VLDL 22 03/20/2013 0557   LDLCALC 36 01/15/2018 1110   LDLCALC 42 03/20/2013 0557    Additional studies/ records that were reviewed today include:  5/2017Study Conclusions   - Left ventricle: The cavity size was normal. Systolic function  was   normal. The estimated ejection fraction was in the range of  60%   to 65%. Wall motion was normal; there were no regional wall   motion abnormalities. Doppler parameters are consistent with   abnormal left ventricular relaxation (grade 1 diastolic   dysfunction). - Right ventricle: Systolic function was normal. - Pulmonary arteries: Systolic pressure was within the normal   range.   Impressions:   - Normal study.  Cardiac catheterization 06/05/2016    Ost RCA lesion, 30 %stenosed.  LM lesion, 30 %stenosed.  Ost LAD lesion, 50 %stenosed - looks similar to prior catheterization. FFR 0.87  The left ventricular systolic function is normal.  LV end diastolic pressure is normal.  The left ventricular ejection fraction is 55-65% by visual estimate.  There is mild (2+) mitral regurgitation.  _______CULPRIT LESION_____________  Prox RCA Overlapped BMS-DES stents, 99 % -n-stent re-stenosed.  PTCA with 3.0 mm Scoring Balloon with Post-dilation using 3.5 mm [place] Balloon was perfromed. Post intervention, there is a 0% residual stenosis.   Recurrent in-stent restenosis of the overlapped stent (BMS and DES) in proximal RCA treated with aggressive angioplasty. Repeat FFR on ostial LAD confirms this is not likely significant.     Plan:  She'll be transferred to 6 [facility] post procedure unit for sheath removal and post PCI care.  Continue aggressive risk factor modification  Continue aspirin and Plavix lifelong  Smoking cessation counseling is mandatory to maintain stent patency       ASSESSMENT:    1. Coronary artery disease involving native coronary artery of native heart with unstable angina pectoris ([place])   2. Essential hypertension   3. Hyperlipidemia LDL goal <70   4. Chronic obstructive pulmonary disease, unspecified COPD type ([place])      PLAN:  In order of problems listed above:  CAD status post inferior MI treated with BMS to the RCA in 2010, DES to the proximal mid RCA 2012 for in-stent restenosis of the BMS, unstable  angina 01/2016 with ISR of the RCA treated with balloon angioplasty of the RCA, Plavix restarted, and stable angina 05/2016 with mild ST depression and negative troponins with in-stent restenosis of the proximal RCA treated with PTCA and scoring balloon.  Repeat FFR and ostial LAD confirms this was not significant.  Patient with recurrent chest pain that awakened her from sleep but not the same as her regular angina.  She always has pain in her arm which she did not have.  She had taken steroids and Levaquin on an empty stomach with coffee the night before.  She truly thinks it was GI.  EKG without acute change.  Patient feels great and has had no recent angina.  Continue medical management.  She will call if she has any recurrent symptoms.  Add Imdur 30 mg once daily because blood pressure is up.  Follow-up with Dr. Irish Lack as scheduled.  Be met today  Essential hypertension blood pressure elevated today.  Add Imdur 30 mg once daily.  Hyperlipidemia LDL 36 on Crestor 01/15/2018  COPD with recent exacerbation on steroids and Levaquin  Tobacco abuse advised to quit smoking  Medication Adjustments/Labs and Tests Ordered: Current medicines are reviewed at length with the patient today.  Concerns regarding medicines are outlined above.  Medication changes, Labs and Tests ordered today are listed in the Patient Instructions below. Patient Instructions  Medication Instructions:  START: Isosorbide 30 MG daily  If you need a refill on your cardiac medications before your next appointment,  please call your pharmacy.   Lab work: TODAY: BMET  If you have labs (blood work) drawn today and your tests are completely normal, you will receive your results only by: Marland Kitchen MyChart Message (if you have MyChart) OR . A paper copy in the mail If you have any lab test that is abnormal or we need to change your treatment, we will call you to review the results.  Testing/Procedures: None  Follow-Up: Please keep follow  up appointment with Dr. Irish Lack on 04/18/18 @ 10:20 AM   Any Other Special Instructions Will Be Listed Below (If Applicable).  Steps to Quit Smoking Smoking tobacco can be bad for your health. It can also affect almost every organ in your body. Smoking puts you and people around you at risk for many serious long-lasting (chronic) diseases. Quitting smoking is hard, but it is one of the best things that you can do for your health. It is never too late to quit. What are the benefits of quitting smoking? When you quit smoking, you lower your risk for getting serious diseases and conditions. They can include:  Lung cancer or lung disease.  Heart disease.  Stroke.  Heart attack.  Not being able to have children (infertility).  Weak bones (osteoporosis) and broken bones (fractures).  If you have coughing, wheezing, and shortness of breath, those symptoms may get better when you quit. You may also get sick less often. If you are pregnant, quitting smoking can help to lower your chances of having a baby of low birth weight. What can I do to help me quit smoking? Talk with your doctor about what can help you quit smoking. Some things you can do (strategies) include:  Quitting smoking totally, instead of slowly cutting back how much you smoke over a period of time.  Going to in-person counseling. You are more likely to quit if you go to many counseling sessions.  Using resources and support systems, such as: ? Database administrator with a Social worker. ? Phone quitlines. ? Careers information officer. ? Support groups or group counseling. ? Text messaging programs. ? Mobile phone apps or applications.  Taking medicines. Some of these medicines may have nicotine in them. If you are pregnant or breastfeeding, do not take any medicines to quit smoking unless your doctor says it is okay. Talk with your doctor about counseling or other things that can help you.  Talk with your doctor about using more  than one strategy at the same time, such as taking medicines while you are also going to in-person counseling. This can help make quitting easier. What things can I do to make it easier to quit? Quitting smoking might feel very hard at first, but there is a lot that you can do to make it easier. Take these steps:  Talk to your family and friends. Ask them to support and encourage you.  Call phone quitlines, reach out to support groups, or work with a Social worker.  Ask people who smoke to not smoke around you.  Avoid places that make you want (trigger) to smoke, such as: ? Bars. ? Parties. ? Smoke-break areas at work.  Spend time with people who do not smoke.  Lower the stress in your life. Stress can make you want to smoke. Try these things to help your stress: ? Getting regular exercise. ? Deep-breathing exercises. ? Yoga. ? Meditating. ? Doing a body scan. To do this, close your eyes, focus on one area of your body at a  time from head to toe, and notice which parts of your body are tense. Try to relax the muscles in those areas.  Download or buy apps on your mobile phone or tablet that can help you stick to your quit plan. There are many free apps, such as QuitGuide from the [employer] Office manager for Disease Control and Prevention). You can find more support from smokefree.gov and other websites.  This information is not intended to replace advice given to you by your health care provider. Make sure you discuss any questions you have with your health care provider. Document Released: 02/25/2009 Document Revised: 12/28/2015 Document Reviewed: 09/15/2014 Elsevier Interactive Patient Education  2018 [place], Ermalinda Barrios, [place]  03/13/2018 9:12 AM    [facility] [address], [place],   [address] Phone: [phone]; Fax: [phone]

## 2018-03-12 NOTE — Telephone Encounter (Signed)
Patient returning call. Patient states that she woke up out of her sleep at 4 AM and was sweaty, clammy, and felt like she was going to pass out. She states that she was SOB. She states that she did not have chest pain but she did have a burning sensation in the center of her chest. She called 911. She states that she took a baby ASA and NTG x 2 with relief. She states that by the time EMS arrived she felt better. She states that they did an EKG and it was normal. She states that her SBP was 190. Patient states that since she felt better she did not go to the hospital and she did not want to go to [facility]. Patient states that she has not had any recurrent episodes. Made patient aware that the when Dr. Irish Lack saw her last he said that she would need a cath if she had Sx or had to increase NTG use. Patient did not want to go to the ER. Patient does not have any Sx at this time. Patient already has appointment with Ermalinda Barrios, PA in the morning. Instructed her to keep appointment in the morning to set up cath. Instructed the patient to be seen in the ER if she has any recurrent Sx. Patient verbalized understanding and thanked me for the call.

## 2018-03-13 ENCOUNTER — Ambulatory Visit (INDEPENDENT_AMBULATORY_CARE_PROVIDER_SITE_OTHER): Payer: Medicare HMO | Admitting: Physician Assistant

## 2018-03-13 ENCOUNTER — Encounter: Payer: Self-pay | Admitting: Physician Assistant

## 2018-03-13 VITALS — BP 130/98 | HR 88 | Ht 59.0 in | Wt 138.8 lb

## 2018-03-13 DIAGNOSIS — E785 Hyperlipidemia, unspecified: Secondary | ICD-10-CM | POA: Diagnosis not present

## 2018-03-13 DIAGNOSIS — I10 Essential (primary) hypertension: Secondary | ICD-10-CM | POA: Diagnosis not present

## 2018-03-13 DIAGNOSIS — I25110 Atherosclerotic heart disease of native coronary artery with unstable angina pectoris: Secondary | ICD-10-CM | POA: Diagnosis not present

## 2018-03-13 DIAGNOSIS — J449 Chronic obstructive pulmonary disease, unspecified: Secondary | ICD-10-CM | POA: Diagnosis not present

## 2018-03-13 LAB — BASIC METABOLIC PANEL
BUN / CREAT RATIO: 25 (ref 12–28)
BUN: 20 mg/dL (ref 8–27)
CO2: 20 mmol/L (ref 20–29)
Calcium: 9.6 mg/dL (ref 8.7–10.3)
Chloride: 102 mmol/L (ref 96–106)
Creatinine, Ser: 0.79 mg/dL (ref 0.57–1.00)
GFR calc Af Amer: 86 mL/min/{1.73_m2} (ref >59)
GFR calc non Af Amer: 74 mL/min/{1.73_m2} (ref >59)
Glucose: 104 mg/dL — ABNORMAL HIGH (ref 65–99)
POTASSIUM: 4.7 mmol/L (ref 3.5–5.2)
SODIUM: 140 mmol/L (ref 134–144)

## 2018-03-13 MED ORDER — ISOSORBIDE MONONITRATE ER 30 MG PO TB24: 30.0000 mg | ORAL_TABLET | Freq: Every day | ORAL | 3 refills | Status: DC

## 2018-03-13 NOTE — Patient Instructions (Signed)
Medication Instructions:  START: Isosorbide 30 MG daily  If you need a refill on your cardiac medications before your next appointment, please call your pharmacy.   Lab work: TODAY: BMET  If you have labs (blood work) drawn today and your tests are completely normal, you will receive your results only by: Marland Kitchen MyChart Message (if you have MyChart) OR . A paper copy in the mail If you have any lab test that is abnormal or we need to change your treatment, we will call you to review the results.  Testing/Procedures: None  Follow-Up: Please keep follow up appointment with Dr. Irish Lack on 04/18/18 @ 10:20 AM   Any Other Special Instructions Will Be Listed Below (If Applicable).  Steps to Quit Smoking Smoking tobacco can be bad for your health. It can also affect almost every organ in your body. Smoking puts you and people around you at risk for many serious long-lasting (chronic) diseases. Quitting smoking is hard, but it is one of the best things that you can do for your health. It is never too late to quit. What are the benefits of quitting smoking? When you quit smoking, you lower your risk for getting serious diseases and conditions. They can include:  Lung cancer or lung disease.  Heart disease.  Stroke.  Heart attack.  Not being able to have children (infertility).  Weak bones (osteoporosis) and broken bones (fractures).  If you have coughing, wheezing, and shortness of breath, those symptoms may get better when you quit. You may also get sick less often. If you are pregnant, quitting smoking can help to lower your chances of having a baby of low birth weight. What can I do to help me quit smoking? Talk with your doctor about what can help you quit smoking. Some things you can do (strategies) include:  Quitting smoking totally, instead of slowly cutting back how much you smoke over a period of time.  Going to in-person counseling. You are more likely to quit if you go to  many counseling sessions.  Using resources and support systems, such as: ? Database administrator with a Social worker. ? Phone quitlines. ? Careers information officer. ? Support groups or group counseling. ? Text messaging programs. ? Mobile phone apps or applications.  Taking medicines. Some of these medicines may have nicotine in them. If you are pregnant or breastfeeding, do not take any medicines to quit smoking unless your doctor says it is okay. Talk with your doctor about counseling or other things that can help you.  Talk with your doctor about using more than one strategy at the same time, such as taking medicines while you are also going to in-person counseling. This can help make quitting easier. What things can I do to make it easier to quit? Quitting smoking might feel very hard at first, but there is a lot that you can do to make it easier. Take these steps:  Talk to your family and friends. Ask them to support and encourage you.  Call phone quitlines, reach out to support groups, or work with a Social worker.  Ask people who smoke to not smoke around you.  Avoid places that make you want (trigger) to smoke, such as: ? Bars. ? Parties. ? Smoke-break areas at work.  Spend time with people who do not smoke.  Lower the stress in your life. Stress can make you want to smoke. Try these things to help your stress: ? Getting regular exercise. ? Deep-breathing exercises. ? Yoga. ?  Meditating. ? Doing a body scan. To do this, close your eyes, focus on one area of your body at a time from head to toe, and notice which parts of your body are tense. Try to relax the muscles in those areas.  Download or buy apps on your mobile phone or tablet that can help you stick to your quit plan. There are many free apps, such as QuitGuide from the [employer] Office manager for Disease Control and Prevention). You can find more support from smokefree.gov and other websites.  This information is not intended to replace  advice given to you by your health care provider. Make sure you discuss any questions you have with your health care provider. Document Released: 02/25/2009 Document Revised: 12/28/2015 Document Reviewed: 09/15/2014 Elsevier Interactive Patient Education  2018 [employer].

## 2018-03-17 DIAGNOSIS — F4323 Adjustment disorder with mixed anxiety and depressed mood: Secondary | ICD-10-CM | POA: Diagnosis not present

## 2018-03-17 DIAGNOSIS — R69 Illness, unspecified: Secondary | ICD-10-CM | POA: Diagnosis not present

## 2018-03-18 ENCOUNTER — Other Ambulatory Visit: Payer: Self-pay | Admitting: Obstetrics and Gynecology

## 2018-03-18 DIAGNOSIS — J181 Lobar pneumonia, unspecified organism: Secondary | ICD-10-CM | POA: Diagnosis not present

## 2018-03-18 DIAGNOSIS — R05 Cough: Secondary | ICD-10-CM | POA: Diagnosis not present

## 2018-03-18 DIAGNOSIS — N6310 Unspecified lump in the right breast, unspecified quadrant: Secondary | ICD-10-CM

## 2018-03-19 ENCOUNTER — Other Ambulatory Visit: Payer: Self-pay | Admitting: Obstetrics and Gynecology

## 2018-03-19 DIAGNOSIS — E2839 Other primary ovarian failure: Secondary | ICD-10-CM

## 2018-03-22 ENCOUNTER — Ambulatory Visit
Admission: RE | Admit: 2018-03-22 | Discharge: 2018-03-22 | Disposition: A | Discharge status: Home / Self Care | Payer: Medicare HMO | Source: Ambulatory Visit | Attending: Obstetrics and Gynecology | Admitting: Obstetrics and Gynecology

## 2018-03-22 ENCOUNTER — Other Ambulatory Visit: Payer: Self-pay | Admitting: Obstetrics and Gynecology

## 2018-03-22 DIAGNOSIS — N6310 Unspecified lump in the right breast, unspecified quadrant: Secondary | ICD-10-CM

## 2018-03-22 DIAGNOSIS — N6311 Unspecified lump in the right breast, upper outer quadrant: Secondary | ICD-10-CM | POA: Diagnosis not present

## 2018-03-22 DIAGNOSIS — R928 Other abnormal and inconclusive findings on diagnostic imaging of breast: Secondary | ICD-10-CM | POA: Diagnosis not present

## 2018-03-22 IMAGING — US ULTRASOUND RIGHT BREAST LIMITED
1 series · 13 of 16 positions shown · non-contrast
Comparison: Baseline screening mammogram dated [DATE].

CLINICAL DATA: Patient was called back from screening mammogram for
a possible mass in the right breast.

EXAM:
DIGITAL DIAGNOSTIC RIGHT MAMMOGRAM WITH TOMO
ULTRASOUND RIGHT BREAST

[Series 1: ultrasound right breast limited · 0.07mm/px · 13 of 16 slices shown]
[im 1/16]
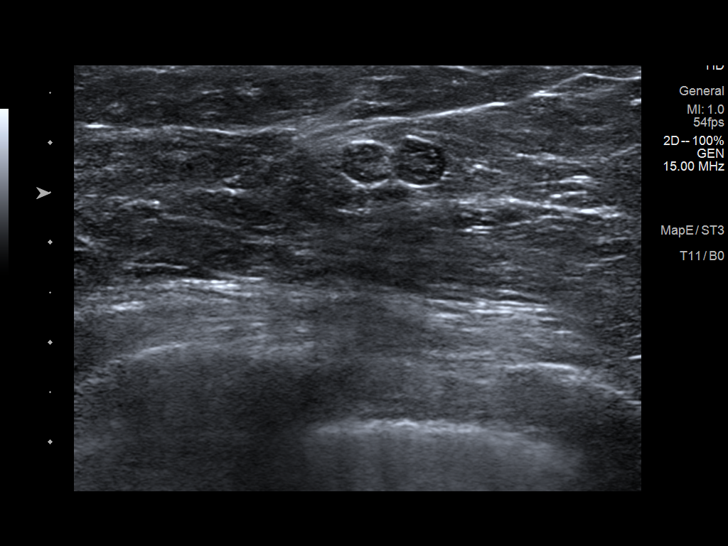
[im 2/16]
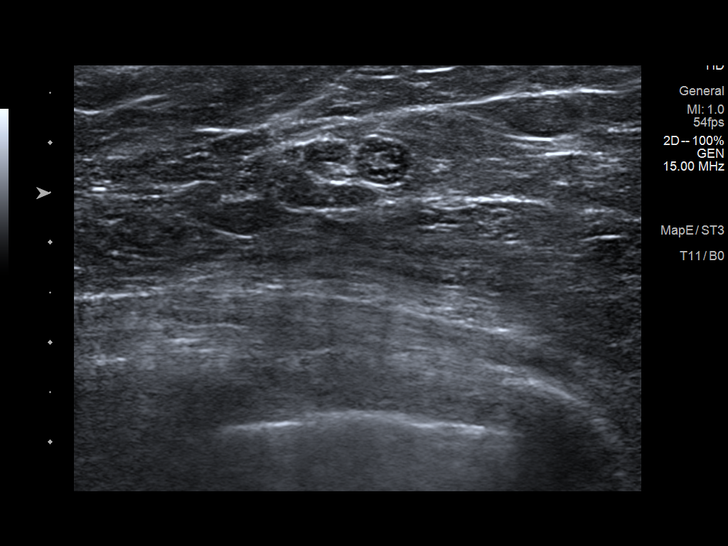
[im 4/16]
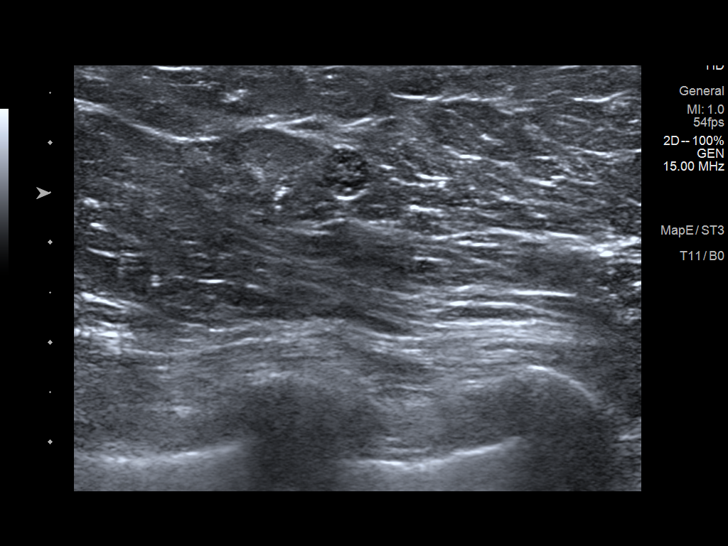
[im 5/16]
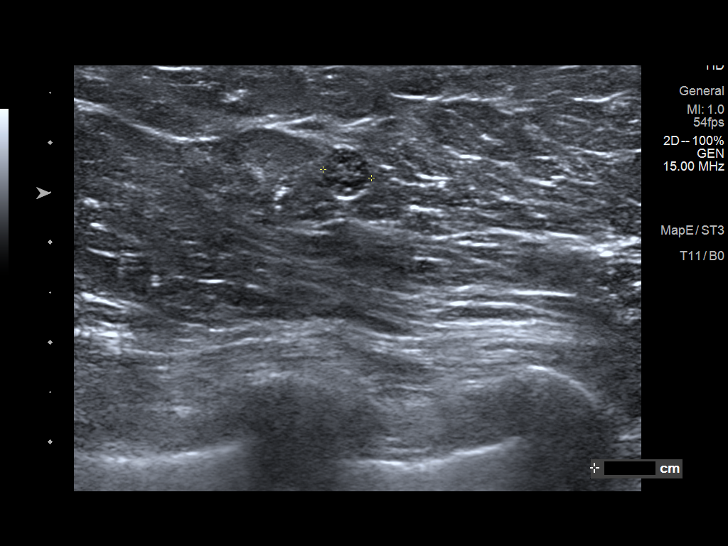
[im 6/16]
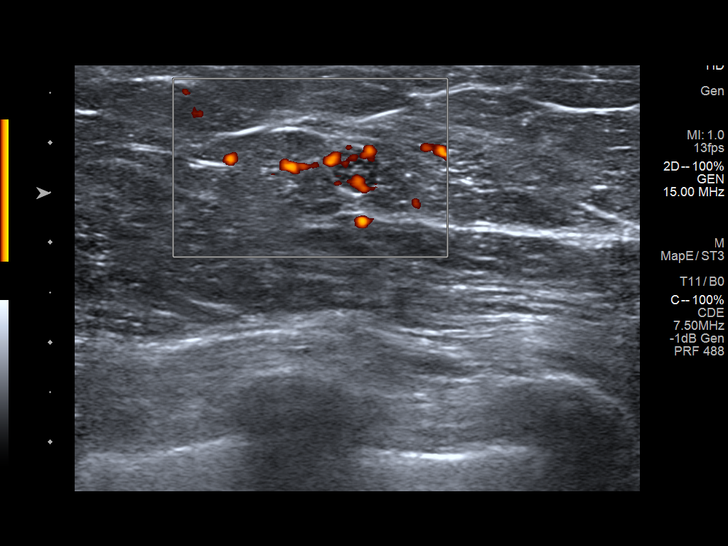
[im 7/16]
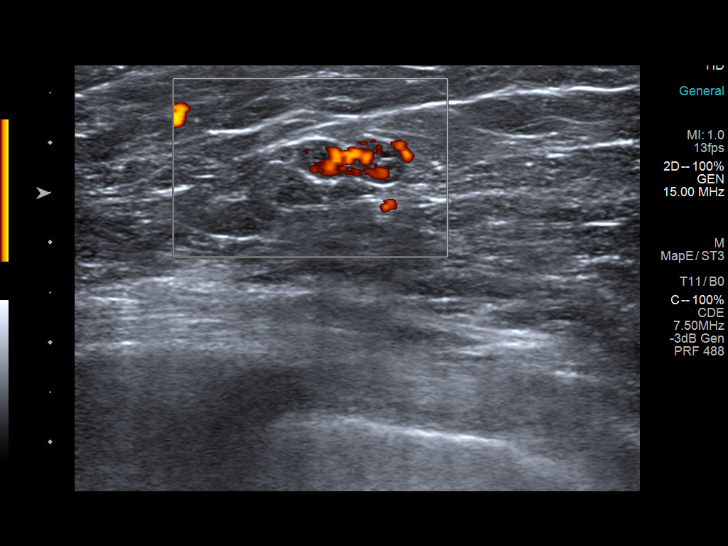
[im 9/16]
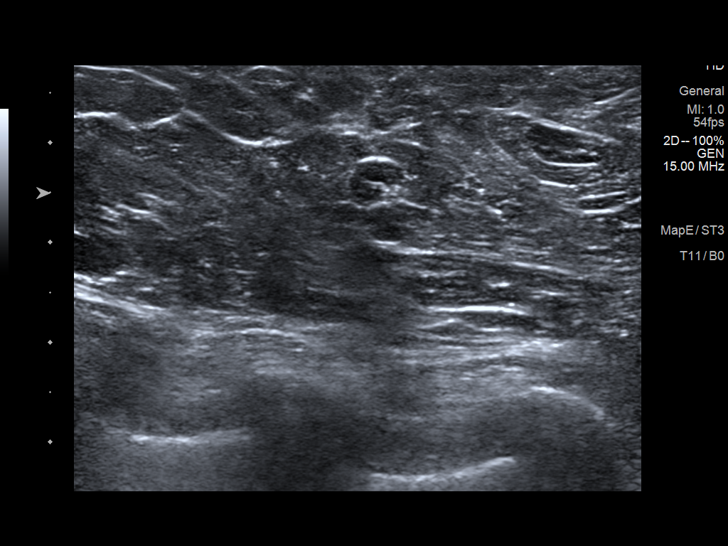
[im 10/16]
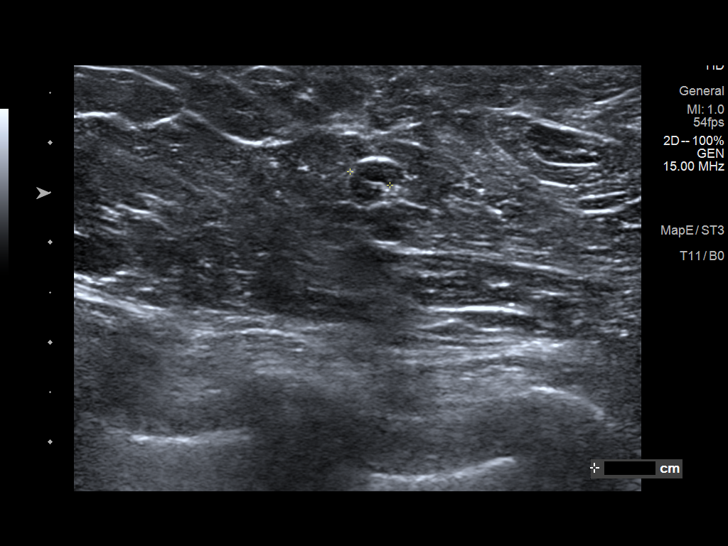
[im 11/16]
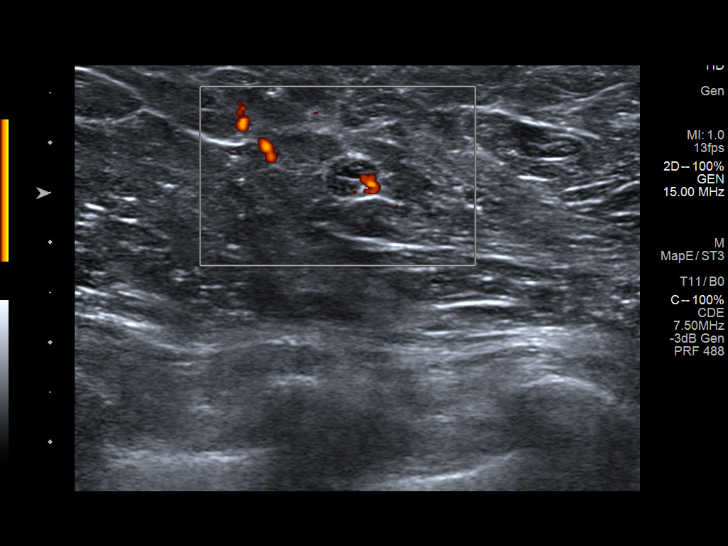
[im 12/16]
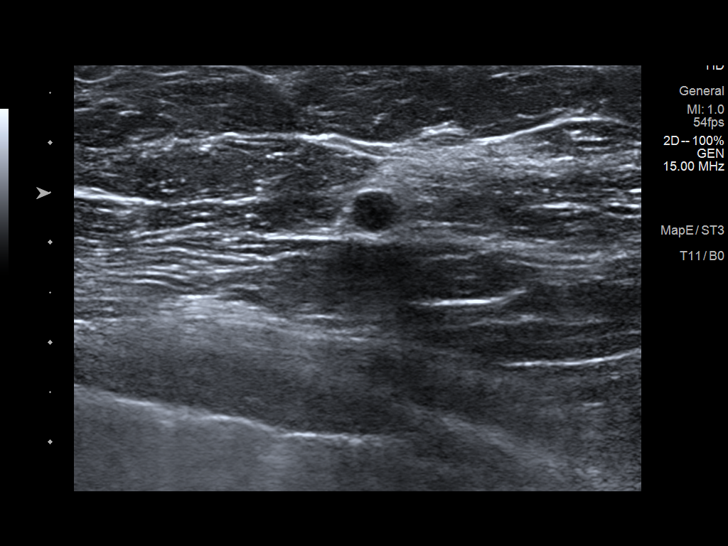
[im 13/16]
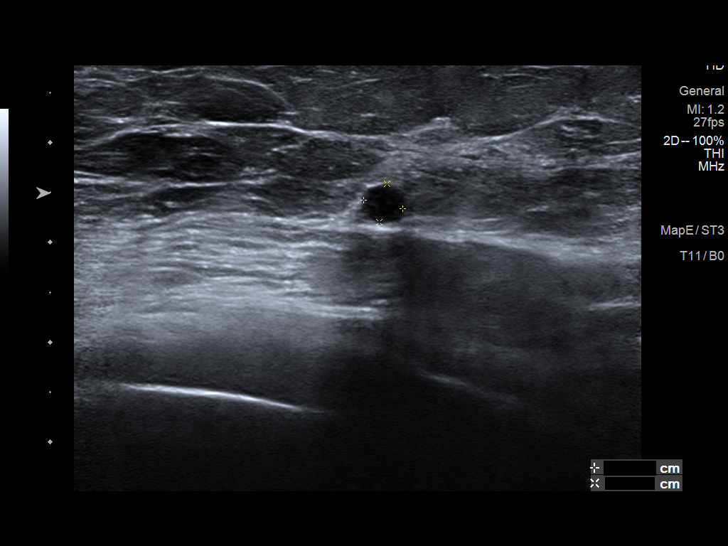
[im 15/16]
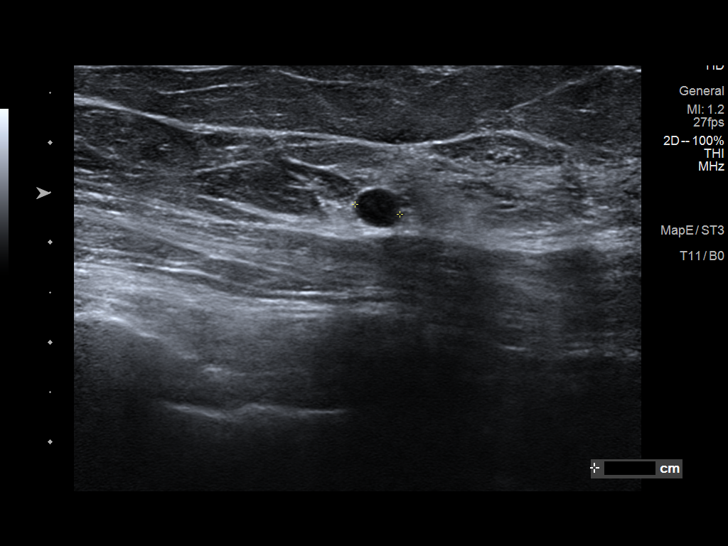
[im 16/16]
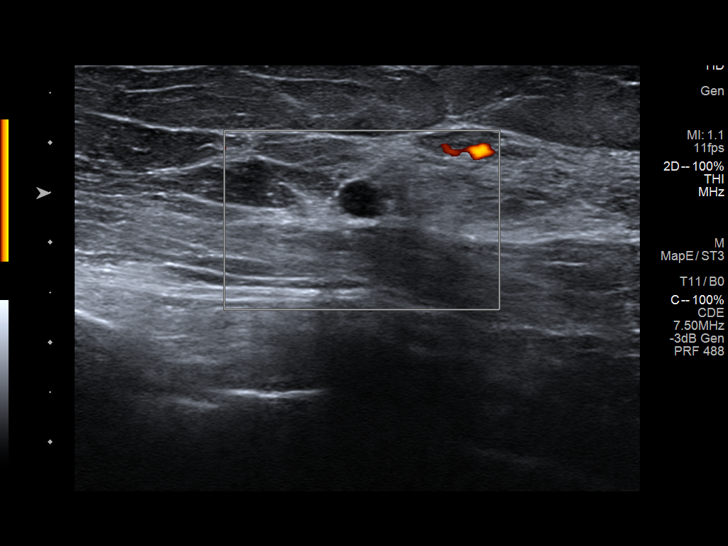

[13 of 16 positions shown; findings below may reference images not displayed]

ACR Breast Density Category b: There are scattered areas of
fibroglandular density.
FINDINGS: Additional imaging of the right breast was performed. There is
persistence of a well-circumscribed 8 mm mass in the upper-outer
quadrant of the breast. There are no malignant type
microcalcifications.

Targeted ultrasound is performed, showing a well-circumscribed near
anechoic mass in the right breast at 10 o'clock 6 cm from the nipple
measuring 4 x 4 x 5 mm. Two benign-appearing lymph nodes are seen in
the right breast at 10 o'clock 10 cm from the nipple.
IMPRESSION: Probable benign cyst in the right breast.

RECOMMENDATION:
Short-term interval follow-up right breast ultrasound in 6 months is
recommended.

I have discussed the findings and recommendations with the patient.
Results were also provided in writing at the conclusion of the
visit. If applicable, a reminder letter will be sent to the patient
regarding the next appointment.

BI-RADS CATEGORY  3: Probably benign.

## 2018-04-02 ENCOUNTER — Other Ambulatory Visit: Payer: Medicare HMO

## 2018-04-13 ENCOUNTER — Other Ambulatory Visit: Payer: Self-pay | Admitting: Physician Assistant

## 2018-04-13 DIAGNOSIS — B9689 Other specified bacterial agents as the cause of diseases classified elsewhere: Secondary | ICD-10-CM

## 2018-04-13 DIAGNOSIS — J208 Acute bronchitis due to other specified organisms: Principal | ICD-10-CM

## 2018-04-17 NOTE — Progress Notes (Deleted)
Cardiology Office Note   Date:  04/17/2018   ID:  Kathryn Garrett, DOB 24-Sep-1945, MRN [id number]  PCP:  Pleas Koch, NP    No chief complaint on file.    Wt Readings from Last 3 Encounters:  03/13/18 138 lb 12.8 oz (63 kg)  03/05/18 141 lb 12.8 oz (64.3 kg)  09/25/17 139 lb (63 kg)       History of Present Illness: Kathryn Garrett is a 72 y.o. female  with history of HTN, HL, COPD with ongoing tobacco use, CAD status post inferior MI treated with bare-metal stent to the RCA in 2010,status post DES to the proximal to mid RCA 06/2010 for instent restenosis of the bare-metal stent.She had moderate left main/LAD disease which was not significant.  She was admitted in 9/17 for [place]. Cath with ISR of RCAfor which sheunderwent balloon angioplasty of RCA with ISR 75%->0%. Plavix wasrestarted.   She presented recently to [facility] on 06/04/16 with complaint of recurrent CP c/w unstable angina. Her CP was relieved with NTG.ECG with mild ST depression in inferior leads. 1st troponinwasnormal. Given her history and symptomatology, she was referred for LHC. She was found to have recurrent in-stent restenosis within the proximal RCA, treated with PTCA with scoring balloon.Repeat FFR on ostial LAD confirms this was not significant.She was continued on DAPT with ASA + Plavix and metoprolol. Smoking cessation was advised.   Of note, she was also found to have a right carotid bruit on exam. Subsequent doppler study showed bilateral ECA stenosis >50%but no significant ICA.  She was found to desat duing sleep into the 70s.    Tried Chantix for smoking cessation.  Past Medical History:  Diagnosis Date  . Anemia, mild   . Arthritis    "qwhere" (02/10/2016)  . CAD (coronary artery disease)    a. VF arrest 01/2009/CAD with inferoposterior MI s/p aspiration thrombectomy/BMS of RCA at that time. b. ISR of BMS s/p DES to RCA 06/2010 with moderate LM/LAD disease not significant by  FFR. c. s/p angiosculpt PTCA to prox RCA 01/2016 for ISR. d. Aggressive PTCA to prox RCA for ISR 05/2016.  . Cardiac arrest ([place]) 01/2009   a. in setting of inf-post STEMI 01/2009 (VF).  . Chest pain 06/04/2016  . Chronic bronchitis ([place])   . Chronic combined systolic and diastolic CHF (congestive heart failure) ([place])    a. EF 40-45% by cath 2010. b. 60-65% with grade 1 DD by echo 09/2015  . COPD (chronic obstructive pulmonary disease) ([place])   . Hyperlipidemia 01/28/2016  . Hyperlipidemia LDL goal <70 01/28/2016  . Hypertension   . Lung mass   . MI (myocardial infarction) ([place]) 01/2009  . Narcotic abuse ([place])    pt now taking Suboxone tid  . Pleural effusion on left 09/24/2015  . Pneumonia 06/2014; 09/2015  . Pre-diabetes   . Pulmonary nodule   . PVD (peripheral vascular disease) (H[facility])    mild atherosclerosis of infrarenal aorta, 25% ostial left renal artery stenosis, 50% ostial right common iliac by cath 2010  . S/P PTCA (percutaneous transluminal coronary angioplasty) 02/10/16 to RCA lesion for in stent restenois 02/11/2016  . Subclavian artery stenosis (H[facility])    a. >50% by duplex 05/2016.  . Tobacco abuse   . Unstable angina [facility])     Past Surgical History:  Procedure Laterality Date  . ABDOMINAL HYSTERECTOMY    . ANKLE FRACTURE SURGERY Right 2000s  . CARDIAC CATHETERIZATION N/A 02/10/2016   Procedure: Left  Heart Cath and Coronary Angiography;  Surgeon: Jettie Booze, MD;  Location: [place] [facility];  Service: Cardiovascular;  Laterality: N/A;  . CARDIAC CATHETERIZATION N/A 02/10/2016   Procedure: Coronary Balloon Angioplasty;  Surgeon: Jettie Booze, MD;  Location: [place] [facility];  Service: Cardiovascular;  Laterality: N/A;  instent RCA  . CARDIAC CATHETERIZATION N/A 06/05/2016   Procedure: Left Heart Cath and Coronary Angiography;  Surgeon: Leonie Man, MD;  Location: [place] [facility];  Service: Cardiovascular;  Laterality: N/A;  . CARDIAC CATHETERIZATION N/A  06/05/2016   Procedure: Coronary Balloon Angioplasty;  Surgeon: Leonie Man, MD;  Location: [place] [facility];  Service: Cardiovascular;  Laterality: N/A;  . CHEST TUBE INSERTION N/A 10/08/2015   Procedure: PLEURX CATH REMOVAL;  Surgeon: [name], MD;  Location: [facility] ORS;  Service: Thoracic;  Laterality: N/A;  . CORONARY ANGIOPLASTY WITH STENT PLACEMENT  01/2009; 2012  . FRACTURE SURGERY    . VIDEO ASSISTED THORACOSCOPY (VATS)/THOROCOTOMY Left 09/29/2015   Procedure: PREOP BRONCHOSCOPY, LEFT THORACOSCOPY, POSSIBLE THORACOTOMY, PLEURAL BIOPSY, TALC;  Surgeon: [name], MD;  Location: [facility] ORS;  Service: General;  Laterality: Left;     Current Outpatient Medications  Medication Sig Dispense Refill  . acetaminophen (TYLENOL) 500 MG tablet Take 1,000 mg by mouth every 6 (six) hours as needed for headache.    . albuterol (PROVENTIL HFA;VENTOLIN HFA) 108 (90 Base) MCG/ACT inhaler Inhale 2 puffs into the lungs every 4 (four) hours as needed for wheezing or shortness of breath.    Marland Kitchen aspirin EC 81 MG tablet Take 81 mg by mouth at bedtime.     Marland Kitchen azelastine (ASTELIN) 0.1 % nasal spray USE 2 SPRAYS BID UNTIL DIRECTED TO STOP. USE FOR RUNNY NOSE  0  . budesonide-formoterol (SYMBICORT) 160-4.5 MCG/ACT inhaler Inhale 1 puff into the lungs 2 (two) times daily. 3 Inhaler 0  . Buprenorphine HCl-Naloxone HCl 8-2 MG FILM Place 1 Film under the tongue 3 (three) times daily.    . cetirizine (ZYRTEC) 10 MG tablet Take 1 tablet (10 mg total) by mouth daily. 30 tablet 0  . clopidogrel (PLAVIX) 75 MG tablet Take 1 tablet (75 mg total) by mouth at bedtime. 90 tablet 2  . fluticasone (FLONASE) 50 MCG/ACT nasal spray Place 1 spray into both nostrils daily.    Marland Kitchen ibuprofen (ADVIL,MOTRIN) 600 MG tablet Take 600 mg by mouth as needed for pain.  0  . isosorbide mononitrate (IMDUR) 30 MG 24 hr tablet Take 1 tablet (30 mg total) by mouth daily. 90 tablet 3  . levofloxacin (LEVAQUIN) 500 MG tablet Take 1 tablet (500 mg  total) by mouth daily. 7 tablet 0  . metoprolol tartrate (LOPRESSOR) 25 MG tablet Take 1 tablet (25 mg total) by mouth 2 (two) times daily. 180 tablet 3  . nitroGLYCERIN (NITROSTAT) 0.4 MG SL tablet Place 1 tablet (0.4 mg total) under the tongue every 5 (five) minutes as needed for chest pain. 25 tablet 4  . predniSONE (DELTASONE) 10 MG tablet Take 6 pills day one, 5 day two, 4 day three, 3 day four, 2 day five, 1 pill day six 21 tablet 0  . rosuvastatin (CRESTOR) 20 MG tablet Take 20 mg by mouth daily.     No current facility-administered medications for this visit.     Allergies:   Azithromycin    Social History:  The patient  reports that she has been smoking cigarettes. She has a 28.00 pack-year smoking history. She has never  used smokeless tobacco. She reports that she does not drink alcohol or use drugs.   Family History:  The patient's ***family history includes Cancer in her brother; Coronary artery disease in her father and mother; Early death in her father; Heart attack in her father and mother; Hyperlipidemia in her father and mother.    ROS:  Please see the history of present illness.   Otherwise, review of systems are positive for ***.   All other systems are reviewed and negative.    PHYSICAL EXAM: VS:  There were no vitals taken for this visit. , BMI There is no height or weight on file to calculate BMI. GEN: Well nourished, well developed, in no acute distress  HEENT: normal  Neck: no JVD, carotid bruits, or masses Cardiac: ***RRR; no murmurs, rubs, or gallops,no edema  Respiratory:  clear to auscultation bilaterally, normal work of breathing GI: soft, nontender, nondistended, + BS MS: no deformity or atrophy  Skin: warm and dry, no rash Neuro:  Strength and sensation are intact Psych: euthymic mood, full affect   EKG:   The ekg ordered today demonstrates ***   Recent Labs: 09/25/2017: ALT 14 03/13/2018: BUN 20; Creatinine, Ser 0.79; Potassium 4.7; Sodium 140    Lipid Panel    Component Value Date/Time   CHOL 91 (L) 01/15/2018 1110   CHOL 85 03/20/2013 0557   TRIG 108 01/15/2018 1110   TRIG 112 03/20/2013 0557   HDL 33 (L) 01/15/2018 1110   HDL 21 (L) 03/20/2013 0557   CHOLHDL 2.8 01/15/2018 1110   CHOLHDL 3.5 06/05/2016 0911   VLDL 24 06/05/2016 0911   VLDL 22 03/20/2013 0557   LDLCALC 36 01/15/2018 1110   LDLCALC 42 03/20/2013 0557     Other studies Reviewed: Additional studies/ records that were reviewed today with results demonstrating: ***.   ASSESSMENT AND PLAN:  1. CAD:   2. Tobacco abuse: 3. PAD: Mild in the past.   4. ?OSA:   Current medicines are reviewed at length with the patient today.  The patient concerns regarding her medicines were addressed.  The following changes have been made:  No change***  Labs/ tests ordered today include: *** No orders of the defined types were placed in this encounter.   Recommend 150 minutes/week of aerobic exercise Low fat, low carb, high fiber diet recommended  Disposition:   FU in ***   Signed, Larae Grooms, MD  04/17/2018 1:33 PM    [facility] [address], [place], [place]  [address] Phone: [phone]; Fax: [phone]

## 2018-04-18 ENCOUNTER — Ambulatory Visit: Payer: Medicare HMO | Admitting: Interventional Cardiology

## 2018-04-23 ENCOUNTER — Encounter: Payer: Self-pay | Admitting: Interventional Cardiology

## 2018-05-13 ENCOUNTER — Other Ambulatory Visit: Payer: Self-pay | Admitting: Primary Care

## 2018-05-13 DIAGNOSIS — Z1159 Encounter for screening for other viral diseases: Secondary | ICD-10-CM

## 2018-05-13 DIAGNOSIS — I10 Essential (primary) hypertension: Secondary | ICD-10-CM

## 2018-05-13 DIAGNOSIS — E785 Hyperlipidemia, unspecified: Secondary | ICD-10-CM

## 2018-05-20 ENCOUNTER — Encounter: Payer: Medicare HMO | Admitting: Primary Care

## 2018-05-21 ENCOUNTER — Ambulatory Visit: Payer: Medicare HMO | Admitting: Physician Assistant

## 2018-05-21 ENCOUNTER — Encounter

## 2018-05-21 ENCOUNTER — Ambulatory Visit: Payer: Medicare HMO

## 2018-05-21 DIAGNOSIS — F4323 Adjustment disorder with mixed anxiety and depressed mood: Secondary | ICD-10-CM | POA: Diagnosis not present

## 2018-05-21 DIAGNOSIS — R69 Illness, unspecified: Secondary | ICD-10-CM | POA: Diagnosis not present

## 2018-05-22 ENCOUNTER — Encounter: Payer: Self-pay | Admitting: Physician Assistant

## 2018-05-28 ENCOUNTER — Encounter: Payer: Medicare HMO | Admitting: Primary Care

## 2018-05-28 ENCOUNTER — Other Ambulatory Visit: Payer: Medicare HMO

## 2018-05-30 ENCOUNTER — Ambulatory Visit
Admission: RE | Admit: 2018-05-30 | Discharge: 2018-05-30 | Disposition: A | Discharge status: Home / Self Care | Payer: Medicare HMO | Source: Ambulatory Visit | Attending: Obstetrics and Gynecology | Admitting: Obstetrics and Gynecology

## 2018-05-30 DIAGNOSIS — E2839 Other primary ovarian failure: Secondary | ICD-10-CM

## 2018-05-30 DIAGNOSIS — M8589 Other specified disorders of bone density and structure, multiple sites: Secondary | ICD-10-CM | POA: Diagnosis not present

## 2018-05-30 DIAGNOSIS — Z780 Asymptomatic menopausal state: Secondary | ICD-10-CM | POA: Diagnosis not present

## 2018-05-31 ENCOUNTER — Telehealth: Payer: Self-pay | Admitting: Primary Care

## 2018-05-31 NOTE — Telephone Encounter (Signed)
Lvm asking patient to call office to schedule awv with Kathryn Garrett. Ok for it to be after pt sees Anda Kraft on Tuesday due to availability on Kathryn Garrett's schedule or we can reschedule visit with Anda Kraft.

## 2018-06-04 ENCOUNTER — Encounter: Payer: Medicare HMO | Admitting: Primary Care

## 2018-07-01 ENCOUNTER — Other Ambulatory Visit: Payer: Self-pay | Admitting: Physician Assistant

## 2018-07-01 ENCOUNTER — Other Ambulatory Visit: Payer: Self-pay | Admitting: Interventional Cardiology

## 2018-08-12 DIAGNOSIS — R69 Illness, unspecified: Secondary | ICD-10-CM | POA: Diagnosis not present

## 2018-08-12 DIAGNOSIS — F4323 Adjustment disorder with mixed anxiety and depressed mood: Secondary | ICD-10-CM | POA: Diagnosis not present

## 2018-09-16 ENCOUNTER — Telehealth: Payer: Self-pay

## 2018-09-16 NOTE — Progress Notes (Signed)
Virtual Visit via Video Note   This visit type was conducted due to national recommendations for restrictions regarding the COVID-19 Pandemic (e.g. social distancing) in an effort to limit this patient's exposure and mitigate transmission in our community.  Due to her co-morbid illnesses, this patient is at least at moderate risk for complications without adequate follow up.  This format is felt to be most appropriate for this patient at this time.  All issues noted in this document were discussed and addressed.  A limited physical exam was performed with this format.  Please refer to the patient's chart for her consent to telehealth for [facility].   Date:  09/17/2018   ID:  Kathryn Garrett, DOB June 12, 1945, MRN [id number]  Patient Location: Home Provider Location: Home  PCP:  Pleas Koch, NP  Cardiologist:  Larae Grooms, MD  Electrophysiologist:  None   Evaluation Performed:  Follow-Up Visit  Chief Complaint:  F/U  History of Present Illness:    Kathryn Garrett is a 73 y.o. female with with history of CAD status post inferior MI treated with BMS to the RCA in 2010, DES to the proximal to mid RCA 06/2010 for in-stent restenosis of the BMS, moderate left main and LAD disease which was not significant.  Unstable angina 01/2016 cath showing ISR of RCA which she underwent balloon angioplasty of the RCA.  Plavix restarted.  Unstable angina 06/04/2016 with mild ST depression negative troponins found to have in-stent restenosis of the proximal RCA treated with PTCA with scoring balloon.  Repeat FFR on ostial LAD confirms this was not significant.  Also has hypertension, HLD, COPD with ongoing tobacco abuse.  Last saw Dr. Irish Lack 09/25/2017 and had mild angina.  He said if the frequency of her nitroglycerin use increases he recommended the patient have a cardiac catheterization.   I saw the patient 02/2018 at which time she had some burning chest discomfort after taking Levaquin  and prednisone on an empty stomach with coffee.  She did not feel like it was her angina and was not having to use nitroglycerin.  She was smoking a half a pack of cigarettes daily. I added Imdur 30 mg daily.  Patient says she very seldom has angina. Occasionally has left arm pain when laying in bed and relieved with NTG. Only happens once every couple of months. Denies chest tightness. Works as a [employer] park manager.  Still smoking less than a pack a day. Working in the garden but no other exercise. Ankle swelling. Eating fast food daily.  The patient does not have symptoms concerning for COVID-19 infection (fever, chills, cough, or new shortness of breath).    Past Medical History:  Diagnosis Date  . Anemia, mild   . Arthritis    "qwhere" (02/10/2016)  . CAD (coronary artery disease)    a. VF arrest 01/2009/CAD with inferoposterior MI s/p aspiration thrombectomy/BMS of RCA at that time. b. ISR of BMS s/p DES to RCA 06/2010 with moderate LM/LAD disease not significant by FFR. c. s/p angiosculpt PTCA to prox RCA 01/2016 for ISR. d. Aggressive PTCA to prox RCA for ISR 05/2016.  . Cardiac arrest ([place]) 01/2009   a. in setting of inf-post STEMI 01/2009 (VF).  . Chest pain 06/04/2016  . Chronic bronchitis ([place])   . Chronic combined systolic and diastolic CHF (congestive heart failure) ([place])    a. EF 40-45% by cath 2010. b. 60-65% with grade 1 DD by echo 09/2015  . COPD (chronic obstructive pulmonary disease) (  [place])   . Hyperlipidemia 01/28/2016  . Hyperlipidemia LDL goal <70 01/28/2016  . Hypertension   . Lung mass   . MI (myocardial infarction) ([place]) 01/2009  . Narcotic abuse ([place])    pt now taking Suboxone tid  . Pleural effusion on left 09/24/2015  . Pneumonia 06/2014; 09/2015  . Pre-diabetes   . Pulmonary nodule   . PVD (peripheral vascular disease) ([facility])    mild atherosclerosis of infrarenal aorta, 25% ostial left renal artery stenosis, 50% ostial right common iliac by cath 2010  . S/P PTCA (percutaneous  transluminal coronary angioplasty) 02/10/16 to RCA lesion for in stent restenois 02/11/2016  . Subclavian artery stenosis (H[facility])    a. >50% by duplex 05/2016.  . Tobacco abuse   . Unstable angina [facility])    Past Surgical History:  Procedure Laterality Date  . ABDOMINAL HYSTERECTOMY    . ANKLE FRACTURE SURGERY Right 2000s  . CARDIAC CATHETERIZATION N/A 02/10/2016   Procedure: Left Heart Cath and Coronary Angiography;  Surgeon: Jettie Booze, MD;  Location: [place] [facility];  Service: Cardiovascular;  Laterality: N/A;  . CARDIAC CATHETERIZATION N/A 02/10/2016   Procedure: Coronary Balloon Angioplasty;  Surgeon: Jettie Booze, MD;  Location: [place] [facility];  Service: Cardiovascular;  Laterality: N/A;  instent RCA  . CARDIAC CATHETERIZATION N/A 06/05/2016   Procedure: Left Heart Cath and Coronary Angiography;  Surgeon: Leonie Man, MD;  Location: [place] [facility];  Service: Cardiovascular;  Laterality: N/A;  . CARDIAC CATHETERIZATION N/A 06/05/2016   Procedure: Coronary Balloon Angioplasty;  Surgeon: Leonie Man, MD;  Location: [place] [facility];  Service: Cardiovascular;  Laterality: N/A;  . CHEST TUBE INSERTION N/A 10/08/2015   Procedure: PLEURX CATH REMOVAL;  Surgeon: [name], MD;  Location: [facility] ORS;  Service: Thoracic;  Laterality: N/A;  . CORONARY ANGIOPLASTY WITH STENT PLACEMENT  01/2009; 2012  . FRACTURE SURGERY    . VIDEO ASSISTED THORACOSCOPY (VATS)/THOROCOTOMY Left 09/29/2015   Procedure: PREOP BRONCHOSCOPY, LEFT THORACOSCOPY, POSSIBLE THORACOTOMY, PLEURAL BIOPSY, TALC;  Surgeon: [name], MD;  Location: [facility] ORS;  Service: General;  Laterality: Left;     Current Meds  Medication Sig  . acetaminophen (TYLENOL) 500 MG tablet Take 1,000 mg by mouth every 6 (six) hours as needed for headache.  . albuterol (PROVENTIL HFA;VENTOLIN HFA) 108 (90 Base) MCG/ACT inhaler Inhale 2 puffs into the lungs every 4 (four) hours as needed for wheezing or shortness of breath.   Marland Kitchen aspirin EC 81 MG tablet Take 81 mg by mouth at bedtime.   Marland Kitchen azelastine (ASTELIN) 0.1 % nasal spray USE 2 SPRAYS BID UNTIL DIRECTED TO STOP. USE FOR RUNNY NOSE  . budesonide-formoterol (SYMBICORT) 160-4.5 MCG/ACT inhaler Inhale 1 puff into the lungs 2 (two) times daily.  . Buprenorphine HCl-Naloxone HCl 8-2 MG FILM Place 1 Film under the tongue 3 (three) times daily.  . cetirizine (ZYRTEC) 10 MG tablet Take 1 tablet (10 mg total) by mouth daily.  . clopidogrel (PLAVIX) 75 MG tablet Take 1 tablet (75 mg total) by mouth at bedtime.  . fluticasone (FLONASE) 50 MCG/ACT nasal spray Place 1 spray into both nostrils daily.  Marland Kitchen ibuprofen (ADVIL,MOTRIN) 600 MG tablet Take 600 mg by mouth as needed for pain.  . isosorbide mononitrate (IMDUR) 30 MG 24 hr tablet Take 1 tablet (30 mg total) by mouth daily.  . metoprolol tartrate (LOPRESSOR) 25 MG tablet TAKE 1 TABLET BY MOUTH TWICE DAILY  . nitroGLYCERIN (NITROSTAT) 0.4 MG SL tablet  Place 1 tablet (0.4 mg total) under the tongue every 5 (five) minutes as needed for chest pain.  . rosuvastatin (CRESTOR) 20 MG tablet TAKE 1 TABLET BY MOUTH ONCE DAILY  . [DISCONTINUED] clopidogrel (PLAVIX) 75 MG tablet Take 1 tablet (75 mg total) by mouth at bedtime.     Allergies:   Azithromycin   Social History   Tobacco Use  . Smoking status: Current Every Day Smoker    Packs/day: 0.50    Years: 56.00    Pack years: 28.00    Types: Cigarettes  . Smokeless tobacco: Never Used  Substance Use Topics  . Alcohol use: No    Alcohol/week: 0.0 standard drinks  . Drug use: No     Family Hx: The patient's family history includes Cancer in her brother; Coronary artery disease in her father and mother; Early death in her father; Heart attack in her father and mother; Hyperlipidemia in her father and mother.  ROS:   Please see the history of present illness.    Review of Systems  Constitution: Negative.  HENT: Negative.   Eyes: Negative.   Cardiovascular: Positive  for dyspnea on exertion.  Respiratory: Negative.   Hematologic/Lymphatic: Negative.   Musculoskeletal: Negative.  Negative for joint pain.  Gastrointestinal: Negative.   Genitourinary: Negative.   Neurological: Negative.     All other systems reviewed and are negative.   Prior CV studies:   The following studies were reviewed today:  09/2015 Study Conclusions   - Left ventricle: The cavity size was normal. Systolic function was   normal. The estimated ejection fraction was in the range of 60%   to 65%. Wall motion was normal; there were no regional wall   motion abnormalities. Doppler parameters are consistent with   abnormal left ventricular relaxation (grade 1 diastolic   dysfunction). - Right ventricle: Systolic function was normal. - Pulmonary arteries: Systolic pressure was within the normal   range.   Impressions:   - Normal study.   Cardiac catheterization 06/05/2016    Ost RCA lesion, 30 %stenosed.  LM lesion, 30 %stenosed.  Ost LAD lesion, 50 %stenosed - looks similar to prior catheterization. FFR 0.87  The left ventricular systolic function is normal.  LV end diastolic pressure is normal.  The left ventricular ejection fraction is 55-65% by visual estimate.  There is mild (2+) mitral regurgitation.  _______CULPRIT LESION_____________  Prox RCA Overlapped BMS-DES stents, 99 % -n-stent re-stenosed.  PTCA with 3.0 mm Scoring Balloon with Post-dilation using 3.5 mm  Balloon was perfromed. Post intervention, there is a 0% residual stenosis.   Recurrent in-stent restenosis of the overlapped stent (BMS and DES) in proximal RCA treated with aggressive angioplasty. Repeat FFR on ostial LAD confirms this is not likely significant.     Plan:  She'll be transferred to 6 [facility] post procedure unit for sheath removal and post PCI care.  Continue aggressive risk factor modification  Continue aspirin and Plavix lifelong  Smoking cessation counseling is  mandatory to maintain stent patency         Labs/Other Tests and Data Reviewed:    EKG:  An ECG dated 03/13/18 was personally reviewed today and demonstrated:  NSR with nonspecific ST changes  Recent Labs: 09/25/2017: ALT 14 03/13/2018: BUN 20; Creatinine, Ser 0.79; Potassium 4.7; Sodium 140   Recent Lipid Panel Lab Results  Component Value Date/Time   CHOL 91 (L) 01/15/2018 11:10 AM   CHOL 85 03/20/2013 05:57 AM   TRIG 108 01/15/2018 11:10  AM   TRIG 112 03/20/2013 05:57 AM   HDL 33 (L) 01/15/2018 11:10 AM   HDL 21 (L) 03/20/2013 05:57 AM   CHOLHDL 2.8 01/15/2018 11:10 AM   CHOLHDL 3.5 06/05/2016 09:11 AM   LDLCALC 36 01/15/2018 11:10 AM   LDLCALC 42 03/20/2013 05:57 AM    Wt Readings from Last 3 Encounters:  09/17/18 136 lb (61.7 kg)  03/13/18 138 lb 12.8 oz (63 kg)  03/05/18 141 lb 12.8 oz (64.3 kg)     Objective:    Vital Signs:  BP 115/71 (BP Location: Left Arm, Patient Position: Sitting, Cuff Size: Normal)   Pulse 85   Ht 4\' 11"  (1.499 m)   Wt 136 lb (61.7 kg)   BMI 27.47 kg/m    VITAL SIGNS:  reviewed GEN:  no acute distress RESPIRATORY:  normal respiratory effort, symmetric expansion CARDIOVASCULAR:  lower extremity edema noted  ASSESSMENT & PLAN:    CAD status post inferior MI treated with BMS to the RCA in 2010, DES to the proximal mid RCA 2012 for in-stent restenosis of the BMS, unstable angina 01/2016 with ISR of the RCA treated with balloon angioplasty of the RCA, Plavix restarted, and stable angina 05/2016 with mild ST depression and negative troponins with in-stent restenosis of the proximal RCA treated with PTCA and scoring balloon.  Repeat FFR and ostial LAD confirms this was not significant.  Needs refills on Plavix. Minimal angina. No changes  Essential hypertension-well controlled- some leg swelling and   Hyperlipidemia on Crestor LDL 36 01/15/18  Tobacco abuse-smoking cessation discussed. Nightmares on nicotine patches and chantix didn't work.    COVID-19 Education: The signs and symptoms of COVID-19 were discussed with the patient and how to seek care for testing (follow up with PCP or arrange E-visit).   The importance of social distancing was discussed today.  Time:   Today, I have spent  12:30 minutes with the patient with telehealth technology discussing the above problems.     Medication Adjustments/Labs and Tests Ordered: Current medicines are reviewed at length with the patient today.  Concerns regarding medicines are outlined above.   Tests Ordered: No orders of the defined types were placed in this encounter.   Medication Changes: Meds ordered this encounter  Medications  . clopidogrel (PLAVIX) 75 MG tablet    Sig: Take 1 tablet (75 mg total) by mouth at bedtime.    Dispense:  90 tablet    Refill:  3    Disposition:  Follow up in 6 month(s) Dr.Varanasi  Signed, Ermalinda Barrios, PA-C  09/17/2018 11:04 AM    [facility]

## 2018-09-16 NOTE — Telephone Encounter (Signed)

## 2018-09-17 ENCOUNTER — Telehealth (INDEPENDENT_AMBULATORY_CARE_PROVIDER_SITE_OTHER): Payer: Medicare HMO | Admitting: Physician Assistant

## 2018-09-17 ENCOUNTER — Other Ambulatory Visit: Payer: Self-pay

## 2018-09-17 ENCOUNTER — Encounter: Payer: Self-pay | Admitting: Physician Assistant

## 2018-09-17 VITALS — BP 115/71 | HR 85 | Ht 59.0 in | Wt 136.0 lb

## 2018-09-17 DIAGNOSIS — Z720 Tobacco use: Secondary | ICD-10-CM

## 2018-09-17 DIAGNOSIS — I10 Essential (primary) hypertension: Secondary | ICD-10-CM

## 2018-09-17 DIAGNOSIS — E785 Hyperlipidemia, unspecified: Secondary | ICD-10-CM

## 2018-09-17 DIAGNOSIS — I2510 Atherosclerotic heart disease of native coronary artery without angina pectoris: Secondary | ICD-10-CM | POA: Diagnosis not present

## 2018-09-17 MED ORDER — CLOPIDOGREL BISULFATE 75 MG PO TABS: 75.0000 mg | ORAL_TABLET | Freq: Every day | ORAL | 3 refills | Status: DC

## 2018-09-17 NOTE — Patient Instructions (Signed)
Medication Instructions:  Your physician recommends that you continue on your current medications as directed. Please refer to the Current Medication list given to you today.  If you need a refill on your cardiac medications before your next appointment, please call your pharmacy.   Lab work: None  If you have labs (blood work) drawn today and your tests are completely normal, you will receive your results only by: Marland Kitchen MyChart Message (if you have MyChart) OR . A paper copy in the mail If you have any lab test that is abnormal or we need to change your treatment, we will call you to review the results.  Testing/Procedures: None  Follow-Up: At [facility], you and your health needs are our priority.  As part of our continuing mission to provide you with exceptional heart care, we have created designated Provider Care Teams.  These Care Teams include your primary Cardiologist (physician) and Advanced Practice Providers (APPs -  Physician Assistants and Nurse Practitioners) who all work together to provide you with the care you need, when you need it. You will need a follow up appointment in 6 months.  Please call our office 2 months in advance to schedule this appointment.  You may see Larae Grooms, MD or one of the following Advanced Practice Providers on your designated Care Team:   [place], PA-C Melina Copa, PA-C . Ermalinda Barrios, PA-C  Any Other Special Instructions Will Be Listed Below (If Applicable).  Coping with Quitting Smoking  Quitting smoking is a physical and mental challenge. You will face cravings, withdrawal symptoms, and temptation. Before quitting, work with your health care provider to make a plan that can help you cope. Preparation can help you quit and keep you from giving in. How can I cope with cravings? Cravings usually last for 5-10 minutes. If you get through it, the craving will pass. Consider taking the following actions to help you cope with  cravings:  Keep your mouth busy: ? Chew sugar-free gum. ? Suck on hard candies or a straw. ? Brush your teeth.  Keep your hands and body busy: ? Immediately change to a different activity when you feel a craving. ? Squeeze or play with a ball. ? Do an activity or a hobby, like making bead jewelry, practicing needlepoint, or working with wood. ? Mix up your normal routine. ? Take a short exercise break. Go for a quick walk or run up and down stairs. ? Spend time in public places where smoking is not allowed.  Focus on doing something kind or helpful for someone else.  Call a friend or family member to talk during a craving.  Join a support group.  Call a quit line, such as 1-800-QUIT-NOW.  Talk with your health care provider about medicines that might help you cope with cravings and make quitting easier for you. How can I deal with withdrawal symptoms? Your body may experience negative effects as it tries to get used to not having nicotine in the system. These effects are called withdrawal symptoms. They may include:  Feeling hungrier than normal.  Trouble concentrating.  Irritability.  Trouble sleeping.  Feeling depressed.  Restlessness and agitation.  Craving a cigarette. To manage withdrawal symptoms:  Avoid places, people, and activities that trigger your cravings.  Remember why you want to quit.  Get plenty of sleep.  Avoid coffee and other caffeinated drinks. These may worsen some of your symptoms. How can I handle social situations? Social situations can be difficult when  you are quitting smoking, especially in the first few weeks. To manage this, you can:  Avoid parties, bars, and other social situations where people might be smoking.  Avoid alcohol.  Leave right away if you have the urge to smoke.  Explain to your family and friends that you are quitting smoking. Ask for understanding and support.  Plan activities with friends or family where  smoking is not an option. What are some ways I can cope with stress? Wanting to smoke may cause stress, and stress can make you want to smoke. Find ways to manage your stress. Relaxation techniques can help. For example:  Breathe slowly and deeply, in through your nose and out through your mouth.  Listen to soothing, relaxing music.  Talk with a family member or friend about your stress.  Light a candle.  Soak in a bath or take a shower.  Think about a peaceful place. What are some ways I can prevent weight gain? Be aware that many people gain weight after they quit smoking. However, not everyone does. To keep from gaining weight, have a plan in place before you quit and stick to the plan after you quit. Your plan should include:  Having healthy snacks. When you have a craving, it may help to: ? Eat plain popcorn, crunchy carrots, celery, or other cut vegetables. ? Chew sugar-free gum.  Changing how you eat: ? Eat small portion sizes at meals. ? Eat 4-6 small meals throughout the day instead of 1-2 large meals a day. ? Be mindful when you eat. Do not watch television or do other things that might distract you as you eat.  Exercising regularly: ? Make time to exercise each day. If you do not have time for a long workout, do short bouts of exercise for 5-10 minutes several times a day. ? Do some form of strengthening exercise, like weight lifting, and some form of aerobic exercise, like running or swimming.  Drinking plenty of water or other low-calorie or no-calorie drinks. Drink 6-8 glasses of water daily, or as much as instructed by your health care provider. Summary  Quitting smoking is a physical and mental challenge. You will face cravings, withdrawal symptoms, and temptation to smoke again. Preparation can help you as you go through these challenges.  You can cope with cravings by keeping your mouth busy (such as by chewing gum), keeping your body and hands busy, and making  calls to family, friends, or a helpline for people who want to quit smoking.  You can cope with withdrawal symptoms by avoiding places where people smoke, avoiding drinks with caffeine, and getting plenty of rest.  Ask your health care provider about the different ways to prevent weight gain, avoid stress, and handle social situations. This information is not intended to replace advice given to you by your health care provider. Make sure you discuss any questions you have with your health care provider. Document Released: 04/28/2016 Document Revised: 04/28/2016 Document Reviewed: 04/28/2016 Elsevier Interactive Patient Education  2019 [place].   Two Gram Sodium Diet 2000 mg  What is Sodium? Sodium is a mineral found naturally in many foods. The most significant source of sodium in the diet is table salt, which is about 40% sodium.  Processed, convenience, and preserved foods also contain a large amount of sodium.  The body needs only 500 mg of sodium daily to function,  A normal diet provides more than enough sodium even if you do not use salt.  Why Limit Sodium? A build up of sodium in the body can cause thirst, increased blood pressure, shortness of breath, and water retention.  Decreasing sodium in the diet can reduce edema and risk of heart attack or stroke associated with high blood pressure.  Keep in mind that there are many other factors involved in these health problems.  Heredity, obesity, lack of exercise, cigarette smoking, stress and what you eat all play a role.  General Guidelines:  Do not add salt at the table or in cooking.  One teaspoon of salt contains over 2 grams of sodium.  Read food labels  Avoid processed and convenience foods  Ask your dietitian before eating any foods not dicussed in the menu planning guidelines  Consult your physician if you wish to use a salt substitute or a sodium containing medication such as antacids.  Limit milk and milk products to 16  oz (2 cups) per day.  Shopping Hints:  READ LABELS!! "Dietetic" does not necessarily mean low sodium.  Salt and other sodium ingredients are often added to foods during processing.   Menu Planning Guidelines Food Group Choose More Often Avoid  Beverages (see also the milk group All fruit juices, low-sodium, salt-free vegetables juices, low-sodium carbonated beverages Regular vegetable or tomato juices, commercially softened water used for drinking or cooking  Breads and Cereals Enriched white, wheat, rye and pumpernickel bread, hard rolls and dinner rolls; muffins, cornbread and waffles; most dry cereals, cooked cereal without added salt; unsalted crackers and breadsticks; low sodium or homemade bread crumbs Bread, rolls and crackers with salted tops; quick breads; instant hot cereals; pancakes; commercial bread stuffing; self-rising flower and biscuit mixes; regular bread crumbs or cracker crumbs  Desserts and Sweets Desserts and sweets mad with mild should be within allowance Instant pudding mixes and cake mixes  Fats Butter or margarine; vegetable oils; unsalted salad dressings, regular salad dressings limited to 1 Tbs; light, sour and heavy cream Regular salad dressings containing bacon fat, bacon bits, and salt pork; snack dips made with instant soup mixes or processed cheese; salted nuts  Fruits Most fresh, frozen and canned fruits Fruits processed with salt or sodium-containing ingredient (some dried fruits are processed with sodium sulfites        Vegetables Fresh, frozen vegetables and low- sodium canned vegetables Regular canned vegetables, sauerkraut, pickled vegetables, and others prepared in brine; frozen vegetables in sauces; vegetables seasoned with ham, bacon or salt pork  Condiments, Sauces, Miscellaneous  Salt substitute with physician's approval; pepper, herbs, spices; vinegar, lemon or lime juice; hot pepper sauce; garlic powder, onion powder, low sodium soy sauce (1  Tbs.); low sodium condiments (ketchup, chili sauce, mustard) in limited amounts (1 tsp.) fresh ground horseradish; unsalted tortilla chips, pretzels, potato chips, popcorn, salsa (1/4 cup) Any seasoning made with salt including garlic salt, celery salt, onion salt, and seasoned salt; sea salt, rock salt, kosher salt; meat tenderizers; monosodium glutamate; mustard, regular soy sauce, barbecue, sauce, chili sauce, teriyaki sauce, steak sauce, Worcestershire sauce, and most flavored vinegars; canned gravy and mixes; regular condiments; salted snack foods, olives, picles, relish, horseradish sauce, catsup   Food preparation: Try these seasonings Meats:    Pork Sage, onion Serve with applesauce  Chicken Poultry seasoning, thyme, parsley Serve with cranberry sauce  Lamb Curry powder, rosemary, garlic, thyme Serve with mint sauce or jelly  Veal Marjoram, basil Serve with current jelly, cranberry sauce  Beef Pepper, bay leaf Serve with dry mustard, unsalted chive butter  [place] leaf,  dill Serve with unsalted lemon butter, unsalted parsley butter  Vegetables:    Asparagus Lemon juice   Broccoli Lemon juice   Carrots Mustard dressing parsley, mint, nutmeg, glazed with unsalted butter and sugar   Green beans Marjoram, lemon juice, nutmeg,dill seed   Tomatoes Basil, marjoram, onion   Spice /blend for [employer]" 4 tsp ground thyme 1 tsp ground sage 3 tsp ground rosemary 4 tsp ground marjoram   Test your knowledge 1. A product that says "Salt Free" may still contain sodium. True or False 2. Garlic Powder and Hot Pepper Sauce an be used as alternative seasonings.True or False 3. Processed foods have more sodium than fresh foods.  True or False 4. Canned Vegetables have less sodium than froze True or False  WAYS TO DECREASE YOUR SODIUM INTAKE 1. Avoid the use of added salt in cooking and at the table.  Table salt (and other prepared seasonings which contain salt) is probably one of the greatest  sources of sodium in the diet.  Unsalted foods can gain flavor from the sweet, sour, and butter taste sensations of herbs and spices.  Instead of using salt for seasoning, try the following seasonings with the foods listed.  Remember: how you use them to enhance natural food flavors is limited only by your creativity... Allspice-Meat, fish, eggs, fruit, peas, red and yellow vegetables Almond Extract-Fruit baked goods Anise Seed-Sweet breads, fruit, carrots, beets, cottage cheese, cookies (tastes like licorice) Basil-Meat, fish, eggs, vegetables, rice, vegetables salads, soups, sauces Bay Leaf-Meat, fish, stews, poultry Burnet-Salad, vegetables (cucumber-like flavor) Caraway Seed-Bread, cookies, cottage cheese, meat, vegetables, cheese, rice Cardamon-Baked goods, fruit, soups Celery Powder or seed-Salads, salad dressings, sauces, meatloaf, soup, bread.Do not use  celery salt Chervil-Meats, salads, fish, eggs, vegetables, cottage cheese (parsley-like flavor) Chili Power-Meatloaf, chicken cheese, corn, eggplant, egg dishes Chives-Salads cottage cheese, egg dishes, soups, vegetables, sauces Cilantro-Salsa, casseroles Cinnamon-Baked goods, fruit, pork, lamb, chicken, carrots Cloves-Fruit, baked goods, fish, pot roast, green beans, beets, carrots Coriander-Pastry, cookies, meat, salads, cheese (lemon-orange flavor) Cumin-Meatloaf, fish,cheese, eggs, cabbage,fruit pie (caraway flavor) [employer], fruit, eggs, fish, poultry, cottage cheese, vegetables Dill Seed-Meat, cottage cheese, poultry, vegetables, fish, salads, bread Fennel Seed-Bread, cookies, apples, pork, eggs, fish, beets, cabbage, cheese, Licorice-like flavor Garlic-(buds or powder) Salads, meat, poultry, fish, bread, butter, vegetables, potatoes.Do not  use garlic salt Ginger-Fruit, vegetables, baked goods, meat, fish, poultry Horseradish Root-Meet, vegetables, butter Lemon Juice or Extract-Vegetables, fruit, tea, baked goods, fish  salads Mace-Baked goods fruit, vegetables, fish, poultry (taste like nutmeg) Maple Extract-Syrups Marjoram-Meat, chicken, fish, vegetables, breads, green salads (taste like Sage) Mint-Tea, lamb, sherbet, vegetables, desserts, carrots, cabbage Mustard, Dry or Seed-Cheese, eggs, meats, vegetables, poultry Nutmeg-Baked goods, fruit, chicken, eggs, vegetables, desserts Onion Powder-Meat, fish, poultry, vegetables, cheese, eggs, bread, rice salads (Do not use   Onion salt) Orange Extract-Desserts, baked goods Oregano-Pasta, eggs, cheese, onions, pork, lamb, fish, chicken, vegetables, green salads Paprika-Meat, fish, poultry, eggs, cheese, vegetables Parsley Flakes-Butter, vegetables, meat fish, poultry, eggs, bread, salads (certain forms may   Contain sodium Pepper-Meat fish, poultry, vegetables, eggs Peppermint Extract-Desserts, baked goods Poppy Seed-Eggs, bread, cheese, fruit dressings, baked goods, noodles, vegetables, cottage  [employer], poultry, meat, fish, cauliflower, turnips,eggs bread Saffron-Rice, bread, veal, chicken, fish, eggs Sage-Meat, fish, poultry, onions, eggplant, tomateos, pork, stews Savory-Eggs, salads, poultry, meat, rice, vegetables, soups, pork Tarragon-Meat, poultry, fish, eggs, butter, vegetables (licorice-like flavor)  Thyme-Meat, poultry, fish, eggs, vegetables, (clover-like flavor), sauces, soups Tumeric-Salads, butter, eggs, fish, rice, vegetables (saffron-like flavor) Vanilla Extract-Baked  goods, candy Vinegar-Salads, vegetables, meat marinades Walnut Extract-baked goods, candy  2. Choose your Foods Wisely   The following is a list of foods to avoid which are high in sodium:  Meats-Avoid all smoked, canned, salt cured, dried and kosher meat and fish as well as Anchovies   [employer] meats:Bologna, Liverwurst, Pastrami Canned meat or fish  Marinated herring Caviar    Pepperoni Corned Beef   Pizza  Dried chipped beef  Salami Frozen breaded fish or meat Salt pork Frankfurters or hot dogs  Sardines Gefilte fish   Sausage Ham (boiled ham, Proscuitto Smoked butt    spiced ham)   Spam      TV Dinners Vegetables Canned vegetables (Regular) Relish Canned mushrooms  Sauerkraut Olives    Tomato juice Pickles  Bakery and Dessert Products Canned puddings  Cream pies Cheesecake   Decorated cakes Cookies  Beverages/Juices Tomato juice, regular  Gatorade   V-8 vegetable juice, regular  Breads and Cereals Biscuit mixes   Salted potato chips, corn chips, pretzels Bread stuffing mixes  Salted crackers and rolls Pancake and waffle mixes Self-rising flour  Seasonings Accent    Meat sauces Barbecue sauce  Meat tenderizer Catsup    Monosodium glutamate (MSG) Celery salt   Onion salt Chili sauce   Prepared mustard Garlic salt   Salt, seasoned salt, sea salt Gravy mixes   Soy sauce Horseradish   Steak sauce Ketchup   Tartar sauce Lite salt    Teriyaki sauce Marinade mixes   Worcestershire sauce  Others Baking powder   Cocoa and cocoa mixes Baking soda   Commercial casserole mixes Candy-caramels, chocolate  Dehydrated soups    Bars, fudge,nougats  Instant rice and pasta mixes Canned broth or soup  Maraschino cherries Cheese, aged and processed cheese and cheese spreads  Learning Assessment Quiz  Indicated T (for True) or F (for False) for each of the following statements:  1. _____ Fresh fruits and vegetables and unprocessed grains are generally low in sodium 2. _____ Water may contain a considerable amount of sodium, depending on the source 3. _____ You can always tell if a food is high in sodium by tasting it 4. _____ Certain laxatives my be high in sodium and should be avoided unless prescribed   by a physician or pharmacist 5. _____ Salt substitutes may be used freely by anyone on a sodium restricted diet 6. _____ Sodium is present in table salt, food additives and as a  natural component of   most foods 7. _____ Table salt is approximately 90% sodium 8. _____ Limiting sodium intake may help prevent excess fluid accumulation in the body 9. _____ On a sodium-restricted diet, seasonings such as bouillon soy sauce, and    cooking wine should be used in place of table salt 10. _____ On an ingredient list, a product which lists monosodium glutamate as the first   ingredient is an appropriate food to include on a low sodium diet  Circle the best answer(s) to the following statements (Hint: there may be more than one correct answer)  11. On a low-sodium diet, some acceptable snack items are:    A. Olives  F. Bean dip   K. Grapefruit juice    B. Salted Pretzels G. Commercial Popcorn   L. Canned peaches    C. Carrot Sticks  H. Bouillon   M. Unsalted nuts   D. [place] fries  I. Peanut butter crackers N. Salami   E. Sweet pickles  [place]  12.  Seasonings that may be used freely on a reduced - sodium diet include   A. Lemon wedges F.Monosodium glutamate K. Celery seed    B.Soysauce   G. Pepper   L. Mustard powder   C. Sea salt  H. Cooking wine  M. Onion flakes   D. Vinegar  E. Prepared horseradish N. Salsa   E. Sage   J. Worcestershire sauce  O. Chutney

## 2018-09-23 ENCOUNTER — Other Ambulatory Visit: Payer: Medicare HMO

## 2018-10-04 DIAGNOSIS — F4323 Adjustment disorder with mixed anxiety and depressed mood: Secondary | ICD-10-CM | POA: Diagnosis not present

## 2018-10-04 DIAGNOSIS — R69 Illness, unspecified: Secondary | ICD-10-CM | POA: Diagnosis not present

## 2018-10-10 ENCOUNTER — Other Ambulatory Visit: Payer: Self-pay | Admitting: Physician Assistant

## 2018-10-18 DIAGNOSIS — I10 Essential (primary) hypertension: Secondary | ICD-10-CM | POA: Diagnosis not present

## 2018-10-18 DIAGNOSIS — J449 Chronic obstructive pulmonary disease, unspecified: Secondary | ICD-10-CM | POA: Diagnosis not present

## 2018-10-18 DIAGNOSIS — E785 Hyperlipidemia, unspecified: Secondary | ICD-10-CM | POA: Diagnosis not present

## 2018-11-01 DIAGNOSIS — R69 Illness, unspecified: Secondary | ICD-10-CM | POA: Diagnosis not present

## 2018-11-27 DIAGNOSIS — F4323 Adjustment disorder with mixed anxiety and depressed mood: Secondary | ICD-10-CM | POA: Diagnosis not present

## 2018-11-27 DIAGNOSIS — R69 Illness, unspecified: Secondary | ICD-10-CM | POA: Diagnosis not present

## 2018-12-28 DIAGNOSIS — F4323 Adjustment disorder with mixed anxiety and depressed mood: Secondary | ICD-10-CM | POA: Diagnosis not present

## 2018-12-28 DIAGNOSIS — R69 Illness, unspecified: Secondary | ICD-10-CM | POA: Diagnosis not present

## 2019-01-28 DIAGNOSIS — R69 Illness, unspecified: Secondary | ICD-10-CM | POA: Diagnosis not present

## 2019-01-28 DIAGNOSIS — F4323 Adjustment disorder with mixed anxiety and depressed mood: Secondary | ICD-10-CM | POA: Diagnosis not present

## 2019-02-25 DIAGNOSIS — R69 Illness, unspecified: Secondary | ICD-10-CM | POA: Diagnosis not present

## 2019-02-25 DIAGNOSIS — F4323 Adjustment disorder with mixed anxiety and depressed mood: Secondary | ICD-10-CM | POA: Diagnosis not present

## 2019-02-27 DIAGNOSIS — I10 Essential (primary) hypertension: Secondary | ICD-10-CM | POA: Diagnosis not present

## 2019-02-27 DIAGNOSIS — Z23 Encounter for immunization: Secondary | ICD-10-CM | POA: Diagnosis not present

## 2019-02-27 DIAGNOSIS — N3000 Acute cystitis without hematuria: Secondary | ICD-10-CM | POA: Diagnosis not present

## 2019-04-02 DIAGNOSIS — R69 Illness, unspecified: Secondary | ICD-10-CM | POA: Diagnosis not present

## 2019-04-02 DIAGNOSIS — F4323 Adjustment disorder with mixed anxiety and depressed mood: Secondary | ICD-10-CM | POA: Diagnosis not present

## 2019-04-17 ENCOUNTER — Other Ambulatory Visit: Payer: Self-pay | Admitting: Interventional Cardiology

## 2019-04-17 MED ORDER — ROSUVASTATIN CALCIUM 20 MG PO TABS: 20.0000 mg | ORAL_TABLET | Freq: Every day | ORAL | 1 refills | Status: DC

## 2019-09-22 ENCOUNTER — Other Ambulatory Visit: Payer: Self-pay | Admitting: Physician Assistant

## 2019-09-22 MED ORDER — ROSUVASTATIN CALCIUM 20 MG PO TABS: 20.0000 mg | ORAL_TABLET | Freq: Every day | ORAL | 0 refills | Status: DC

## 2019-10-01 NOTE — Progress Notes (Signed)
Cardiology Office Note   Date:  10/02/2019   ID:  JRU BELLMORE, DOB [birth year], MRN [id number]  PCP:  Imagene Riches, NP    No chief complaint on file.  CAD/old MI  Wt Readings from Last 3 Encounters:  10/02/19 135 lb 3.2 oz (61.3 kg)  09/17/18 136 lb (61.7 kg)  03/13/18 138 lb 12.8 oz (63 kg)       History of Present Illness: Kathryn Garrett is a 74 y.o. female  with history of HTN, HL, COPD with ongoing tobacco use, CAD status post inferior MI treated with bare-metal stent to the RCA in 2010,status post DES to the proximal to mid RCA 06/2010 for instent restenosis of the bare-metal stent.She had moderate left main/LAD disease which was not significant.  She was admitted in 9/17 for [place]. Cath with ISR of RCAfor which sheunderwent balloon angioplasty of RCA with ISR 75%->0%. Plavix wasrestarted.   She presented recently to [facility] on 06/04/16 with complaint of recurrent CP c/w unstable angina. Her CP was relieved with NTG.ECG with mild ST depression in inferior leads. 1st troponinwasnormal. Given her history and symptomatology, she was referred for LHC. She was found to have recurrent in-stent restenosis within the proximal RCA, treated with PTCA with scoring balloon.Repeat FFR on ostial LAD confirms this was not significant.She was continued on DAPT with ASA + Plavix and metoprolol. Smoking cessation was advised.   Of note, she was also found to have a right carotid bruit on exam. Subsequent doppler study showed bilateral ECA stenosis >50%but no significant ICA.  Between 2019 and today's visit, she has had some atypical chest discomfort.  She has felt some left arm pain at times.  No symptoms related to exertion.  She very rarely uses any nitroglycerin.  Since the last visit, she has had some arm pain which prompts use of NTG.  She takes 1 every few months.  She is more bothered by "pins and needles" feeling in her legs.  She has some right leg pain as well.      Past Medical History:  Diagnosis Date  . Anemia, mild   . Arthritis    "qwhere" (02/10/2016)  . CAD (coronary artery disease)    a. VF arrest 01/2009/CAD with inferoposterior MI s/p aspiration thrombectomy/BMS of RCA at that time. b. ISR of BMS s/p DES to RCA 06/2010 with moderate LM/LAD disease not significant by FFR. c. s/p angiosculpt PTCA to prox RCA 01/2016 for ISR. d. Aggressive PTCA to prox RCA for ISR 05/2016.  . Cardiac arrest ([place]) 01/2009   a. in setting of inf-post STEMI 01/2009 (VF).  . Chest pain 06/04/2016  . Chronic bronchitis ([place])   . Chronic combined systolic and diastolic CHF (congestive heart failure) ([place])    a. EF 40-45% by cath 2010. b. 60-65% with grade 1 DD by echo 09/2015  . COPD (chronic obstructive pulmonary disease) ([place])   . Hyperlipidemia 01/28/2016  . Hyperlipidemia LDL goal <70 01/28/2016  . Hypertension   . Lung mass   . MI (myocardial infarction) ([place]) 01/2009  . Narcotic abuse ([place])    pt now taking Suboxone tid  . Pleural effusion on left 09/24/2015  . Pneumonia 06/2014; 09/2015  . Pre-diabetes   . Pulmonary nodule   . PVD (peripheral vascular disease) ([facility])    mild atherosclerosis of infrarenal aorta, 25% ostial left renal artery stenosis, 50% ostial right common iliac by cath 2010  . S/P PTCA (percutaneous transluminal coronary angioplasty) 02/10/16  to RCA lesion for in stent restenois 02/11/2016  . Subclavian artery stenosis ([facility])    a. >50% by duplex 05/2016.  . Tobacco abuse   . Unstable angina [facility])     Past Surgical History:  Procedure Laterality Date  . ABDOMINAL HYSTERECTOMY    . ANKLE FRACTURE SURGERY Right 2000s  . CARDIAC CATHETERIZATION N/A 02/10/2016   Procedure: Left Heart Cath and Coronary Angiography;  Surgeon: Jettie Booze, MD;  Location: [place] [facility];  Service: Cardiovascular;  Laterality: N/A;  . CARDIAC CATHETERIZATION N/A 02/10/2016   Procedure: Coronary Balloon Angioplasty;  Surgeon: Jettie Booze, MD;   Location: [place] [facility];  Service: Cardiovascular;  Laterality: N/A;  instent RCA  . CARDIAC CATHETERIZATION N/A 06/05/2016   Procedure: Left Heart Cath and Coronary Angiography;  Surgeon: Leonie Man, MD;  Location: [place] [facility];  Service: Cardiovascular;  Laterality: N/A;  . CARDIAC CATHETERIZATION N/A 06/05/2016   Procedure: Coronary Balloon Angioplasty;  Surgeon: Leonie Man, MD;  Location: [place] [facility];  Service: Cardiovascular;  Laterality: N/A;  . CHEST TUBE INSERTION N/A 10/08/2015   Procedure: PLEURX CATH REMOVAL;  Surgeon: [name], MD;  Location: [facility] ORS;  Service: Thoracic;  Laterality: N/A;  . CORONARY ANGIOPLASTY WITH STENT PLACEMENT  01/2009; 2012  . FRACTURE SURGERY    . VIDEO ASSISTED THORACOSCOPY (VATS)/THOROCOTOMY Left 09/29/2015   Procedure: PREOP BRONCHOSCOPY, LEFT THORACOSCOPY, POSSIBLE THORACOTOMY, PLEURAL BIOPSY, TALC;  Surgeon: [name], MD;  Location: [facility] OR[facility];  Service: General;  Laterality: Left;     Current Outpatient Medications  Medication Sig Dispense Refill  . acetaminophen (TYLENOL) 500 MG tablet Take 1,000 mg by mouth every 6 (six) hours as needed for headache.    . albuterol (PROVENTIL HFA;VENTOLIN HFA) 108 (90 Base) MCG/ACT inhaler Inhale 2 puffs into the lungs every 4 (four) hours as needed for wheezing or shortness of breath.    Marland Kitchen aspirin EC 81 MG tablet Take 81 mg by mouth at bedtime.     Marland Kitchen azelastine (ASTELIN) 0.1 % nasal spray USE 2 SPRAYS BID UNTIL DIRECTED TO STOP. USE FOR RUNNY NOSE  0  . budesonide-formoterol (SYMBICORT) 160-4.5 MCG/ACT inhaler Inhale 1 puff into the lungs 2 (two) times daily. 3 Inhaler 0  . Buprenorphine HCl-Naloxone HCl 8-2 MG FILM Place 1 Film under the tongue 3 (three) times daily.    . cetirizine (ZYRTEC) 10 MG tablet Take 1 tablet (10 mg total) by mouth daily. 30 tablet 0  . clopidogrel (PLAVIX) 75 MG tablet TAKE 1 TABLET(75 MG) BY MOUTH AT BEDTIME 90 tablet 0  . fluticasone (FLONASE) 50 MCG/ACT  nasal spray Place 1 spray into both nostrils daily.    Marland Kitchen ibuprofen (ADVIL,MOTRIN) 600 MG tablet Take 600 mg by mouth as needed for pain.  0  . isosorbide mononitrate (IMDUR) 30 MG 24 hr tablet Take 1 tablet (30 mg total) by mouth daily. 90 tablet 3  . metoprolol tartrate (LOPRESSOR) 25 MG tablet TAKE 1 TABLET BY MOUTH TWICE DAILY 180 tablet 3  . nitroGLYCERIN (NITROSTAT) 0.4 MG SL tablet Place 1 tablet (0.4 mg total) under the tongue every 5 (five) minutes as needed for chest pain. 25 tablet 4  . rosuvastatin (CRESTOR) 20 MG tablet Take 1 tablet (20 mg total) by mouth daily. 90 tablet 0   No current facility-administered medications for this visit.    Allergies:   Azithromycin    Social History:  The patient  reports that she has been  smoking cigarettes. She has a 28.00 pack-year smoking history. She has never used smokeless tobacco. She reports that she does not drink alcohol or use drugs.   Family History:  The patient's family history includes Cancer in her brother; Coronary artery disease in her father and mother; Early death in her father; Heart attack in her father and mother; Hyperlipidemia in her father and mother.    ROS:  Please see the history of present illness.   Otherwise, review of systems are positive for occasional shoulder pain; leg pains.   All other systems are reviewed and negative.    PHYSICAL EXAM: VS:  BP 140/78   Pulse 84   Ht 4\' 11"  (1.499 m)   Wt 135 lb 3.2 oz (61.3 kg)   SpO2 95%   BMI 27.31 kg/m  , BMI Body mass index is 27.31 kg/m. GEN: Well nourished, well developed, in no acute distress  HEENT: normal  Neck: no JVD, carotid bruits, or masses Cardiac: RRR; no murmurs, rubs, or gallops,no edema; varicose veins present  Respiratory:  clear to auscultation bilaterally, normal work of breathing GI: soft, nontender, nondistended, + BS MS: no deformity or atrophy  Skin: warm and dry, no rash Neuro:  Strength and sensation are intact Psych: euthymic  mood, full affect   EKG:   The ekg ordered today demonstrates NSR, nonspecific ST changes   Recent Labs: No results found for requested labs within last 8760 hours.   Lipid Panel    Component Value Date/Time   CHOL 91 (L) 01/15/2018 1110   CHOL 85 03/20/2013 0557   TRIG 108 01/15/2018 1110   TRIG 112 03/20/2013 0557   HDL 33 (L) 01/15/2018 1110   HDL 21 (L) 03/20/2013 0557   CHOLHDL 2.8 01/15/2018 1110   CHOLHDL 3.5 06/05/2016 0911   VLDL 24 06/05/2016 0911   VLDL 22 03/20/2013 0557   LDLCALC 36 01/15/2018 1110   LDLCALC 42 03/20/2013 0557     Other studies Reviewed: Additional studies/ records that were reviewed today with results demonstrating: 2019 labs reviewed.   ASSESSMENT AND PLAN:  1. CAD/old MI: Rare anginal sx.  Uses a NTG tab once every few months. If sx get more frequent or intense, would consider cath.  2. Tobacco abuse: cut back but not stopped.  Needs to stop.  3. Hyperlipidemia: Check lipids. Continue Crestor.  Whole food plant based diet.  4. COPD: Needs to stop smoking. She is considering a new inhaler.  5. Elevated fasting blood glucose: Will check A1C.  Glucose was 104. 6. Given leg pains suspicious for neuropathy, will also check B12 level.    Current medicines are reviewed at length with the patient today.  The patient concerns regarding her medicines were addressed.  The following changes have been made:  No change  Labs/ tests ordered today include:  No orders of the defined types were placed in this encounter.   Recommend 150 minutes/week of aerobic exercise Low fat, low carb, high fiber diet recommended  Disposition:   FU in 6 months   Signed, Larae Grooms, MD  10/02/2019 12:05 PM    [facility] [address], [place], [place]  [address] Phone: [phone]; Fax: [phone]

## 2019-10-02 ENCOUNTER — Other Ambulatory Visit: Payer: Self-pay

## 2019-10-02 ENCOUNTER — Ambulatory Visit (INDEPENDENT_AMBULATORY_CARE_PROVIDER_SITE_OTHER): Payer: Medicare HMO | Admitting: Interventional Cardiology

## 2019-10-02 ENCOUNTER — Encounter: Payer: Self-pay | Admitting: Interventional Cardiology

## 2019-10-02 VITALS — BP 140/78 | HR 84 | Ht 59.0 in | Wt 135.2 lb

## 2019-10-02 DIAGNOSIS — R739 Hyperglycemia, unspecified: Secondary | ICD-10-CM

## 2019-10-02 DIAGNOSIS — I2510 Atherosclerotic heart disease of native coronary artery without angina pectoris: Secondary | ICD-10-CM | POA: Diagnosis not present

## 2019-10-02 DIAGNOSIS — E785 Hyperlipidemia, unspecified: Secondary | ICD-10-CM | POA: Diagnosis not present

## 2019-10-02 DIAGNOSIS — M25562 Pain in left knee: Secondary | ICD-10-CM

## 2019-10-02 DIAGNOSIS — J449 Chronic obstructive pulmonary disease, unspecified: Secondary | ICD-10-CM | POA: Diagnosis not present

## 2019-10-02 DIAGNOSIS — M25561 Pain in right knee: Secondary | ICD-10-CM

## 2019-10-02 DIAGNOSIS — Z720 Tobacco use: Secondary | ICD-10-CM

## 2019-10-02 MED ORDER — ISOSORBIDE MONONITRATE ER 30 MG PO TB24: 30.0000 mg | ORAL_TABLET | Freq: Every day | ORAL | 3 refills | Status: DC

## 2019-10-02 NOTE — Patient Instructions (Signed)
Medication Instructions:  Your physician recommends that you continue on your current medications as directed. Please refer to the Current Medication list given to you today.  *If you need a refill on your cardiac medications before your next appointment, please call your pharmacy*   Lab Work: TODAY: CMET, LIPIDS, A1C, B12  If you have labs (blood work) drawn today and your tests are completely normal, you will receive your results only by: Marland Kitchen MyChart Message (if you have MyChart) OR . A paper copy in the mail If you have any lab test that is abnormal or we need to change your treatment, we will call you to review the results.   Testing/Procedures: None ordered   Follow-Up: At [facility], you and your health needs are our priority.  As part of our continuing mission to provide you with exceptional heart care, we have created designated Provider Care Teams.  These Care Teams include your primary Cardiologist (physician) and Advanced Practice Providers (APPs -  Physician Assistants and Nurse Practitioners) who all work together to provide you with the care you need, when you need it.  We recommend signing up for the patient portal called "MyChart".  Sign up information is provided on this After Visit Summary.  MyChart is used to connect with patients for Virtual Visits (Telemedicine).  Patients are able to view lab/test results, encounter notes, upcoming appointments, etc.  Non-urgent messages can be sent to your provider as well.   To learn more about what you can do with MyChart, go to [email or web address].    Your next appointment:   6 month(s)  The format for your next appointment:   In Person  Provider:   Casandra Doffing, MD or Ermalinda Barrios, PA-C   Other Instructions  High-Fiber Diet Fiber, also called dietary fiber, is a type of carbohydrate that is found in fruits, vegetables, whole grains, and beans. A high-fiber diet can have many health benefits. Your health care  provider may recommend a high-fiber diet to help:  Prevent constipation. Fiber can make your bowel movements more regular.  Lower your cholesterol.  Relieve the following conditions: ? Swelling of veins in the anus (hemorrhoids). ? Swelling and irritation (inflammation) of specific areas of the digestive tract (uncomplicated diverticulosis). ? A problem of the large intestine (colon) that sometimes causes pain and diarrhea (irritable bowel syndrome, IBS).  Prevent overeating as part of a weight-loss plan.  Prevent heart disease, type 2 diabetes, and certain cancers. What is my plan? The recommended daily fiber intake in grams (g) includes:  38 g for men age 17 or younger.  30 g for men over age 5.  46 g for women age 78 or younger.  21 g for women over age 44. You can get the recommended daily intake of dietary fiber by:  Eating a variety of fruits, vegetables, grains, and beans.  Taking a fiber supplement, if it is not possible to get enough fiber through your diet. What do I need to know about a high-fiber diet?  It is better to get fiber through food sources rather than from fiber supplements. There is not a lot of research about how effective supplements are.  Always check the fiber content on the nutrition facts label of any prepackaged food. Look for foods that contain 5 g of fiber or more per serving.  Talk with a diet and nutrition specialist (dietitian) if you have questions about specific foods that are recommended or not recommended for your medical condition, especially if  those foods are not listed below.  Gradually increase how much fiber you consume. If you increase your intake of dietary fiber too quickly, you may have bloating, cramping, or gas.  Drink plenty of water. Water helps you to digest fiber. What are tips for following this plan?  Eat a wide variety of high-fiber foods.  Make sure that half of the grains that you eat each day are whole  grains.  Eat breads and cereals that are made with whole-grain flour instead of refined flour or white flour.  Eat brown rice, bulgur wheat, or millet instead of white rice.  Start the day with a breakfast that is high in fiber, such as a cereal that contains 5 g of fiber or more per serving.  Use beans in place of meat in soups, salads, and pasta dishes.  Eat high-fiber snacks, such as berries, raw vegetables, nuts, and popcorn.  Choose whole fruits and vegetables instead of processed forms like juice or sauce. What foods can I eat?  Fruits Berries. Pears. Apples. Oranges. Avocado. Prunes and raisins. Dried figs. Vegetables Sweet potatoes. Spinach. Kale. Artichokes. Cabbage. Broccoli. Cauliflower. Green peas. Carrots. Squash. Grains Whole-grain breads. Multigrain cereal. Oats and oatmeal. Brown rice. Barley. Bulgur wheat. [place]. Quinoa. Bran muffins. Popcorn. Rye wafer crackers. Meats and other proteins Navy, kidney, and pinto beans. Soybeans. Split peas. Lentils. Nuts and seeds. Dairy Fiber-fortified yogurt. Beverages Fiber-fortified soy milk. Fiber-fortified orange juice. Other foods Fiber bars. The items listed above may not be a complete list of recommended foods and beverages. Contact a dietitian for more options. What foods are not recommended? Fruits Fruit juice. Cooked, strained fruit. Vegetables Fried potatoes. Canned vegetables. Well-cooked vegetables. Grains White bread. Pasta made with refined flour. White rice. Meats and other proteins Fatty cuts of meat. Fried chicken or fried fish. Dairy Milk. Yogurt. Cream cheese. Sour cream. Fats and oils Butters. Beverages Soft drinks. Other foods Cakes and pastries. The items listed above may not be a complete list of foods and beverages to avoid. Contact a dietitian for more information. Summary  Fiber is a type of carbohydrate. It is found in fruits, vegetables, whole grains, and beans.  There are many health  benefits of eating a high-fiber diet, such as preventing constipation, lowering blood cholesterol, helping with weight loss, and reducing your risk of heart disease, diabetes, and certain cancers.  Gradually increase your intake of fiber. Increasing too fast can result in cramping, bloating, and gas. Drink plenty of water while you increase your fiber.  The best sources of fiber include whole fruits and vegetables, whole grains, nuts, seeds, and beans. This information is not intended to replace advice given to you by your health care provider. Make sure you discuss any questions you have with your health care provider. Document Revised: 03/05/2017 Document Reviewed: 03/05/2017 Elsevier Patient Education  2020 [employer].

## 2019-10-03 LAB — COMPREHENSIVE METABOLIC PANEL
ALT: 11 IU/L (ref 0–32)
AST: 13 IU/L (ref 0–40)
Albumin/Globulin Ratio: 1.7 (ref 1.2–2.2)
Albumin: 4.2 g/dL (ref 3.7–4.7)
Alkaline Phosphatase: 82 IU/L (ref 48–121)
BUN/Creatinine Ratio: 22 (ref 12–28)
BUN: 19 mg/dL (ref 8–27)
Bilirubin Total: <0.2 mg/dL (ref 0.0–1.2)
CO2: 24 mmol/L (ref 20–29)
Calcium: 9.5 mg/dL (ref 8.7–10.3)
Chloride: 104 mmol/L (ref 96–106)
Creatinine, Ser: 0.86 mg/dL (ref 0.57–1.00)
GFR calc Af Amer: 77 mL/min/{1.73_m2} (ref >59)
GFR calc non Af Amer: 67 mL/min/{1.73_m2} (ref >59)
Globulin, Total: 2.5 g/dL (ref 1.5–4.5)
Glucose: 63 mg/dL — ABNORMAL LOW (ref 65–99)
Potassium: 5 mmol/L (ref 3.5–5.2)
Sodium: 141 mmol/L (ref 134–144)
Total Protein: 6.7 g/dL (ref 6.0–8.5)

## 2019-10-03 LAB — HEMOGLOBIN A1C
Est. average glucose Bld gHb Est-mCnc: 137 mg/dL
Hgb A1c MFr Bld: 6.4 % — ABNORMAL HIGH (ref 4.8–5.6)

## 2019-10-03 LAB — LIPID PANEL
Chol/HDL Ratio: 2.6 ratio (ref 0.0–4.4)
Cholesterol, Total: 107 mg/dL (ref 100–199)
HDL: 41 mg/dL (ref >39)
LDL Chol Calc (NIH): 43 mg/dL (ref 0–99)
Triglycerides: 127 mg/dL (ref 0–149)
VLDL Cholesterol Cal: 23 mg/dL (ref 5–40)

## 2019-10-03 LAB — VITAMIN B12: Vitamin B-12: 368 pg/mL (ref 232–1245)

## 2019-12-21 ENCOUNTER — Other Ambulatory Visit: Payer: Self-pay | Admitting: Physician Assistant

## 2019-12-21 ENCOUNTER — Other Ambulatory Visit: Payer: Self-pay | Admitting: Interventional Cardiology

## 2020-01-16 ENCOUNTER — Telehealth: Payer: Self-pay | Admitting: Interventional Cardiology

## 2020-01-16 MED ORDER — FUROSEMIDE 40 MG PO TABS: 40.0000 mg | ORAL_TABLET | Freq: Every day | ORAL | 3 refills | Status: DC | PRN

## 2020-01-16 MED ORDER — POTASSIUM CHLORIDE CRYS ER 20 MEQ PO TBCR: 20.0000 meq | EXTENDED_RELEASE_TABLET | Freq: Every day | ORAL | 3 refills | Status: DC | PRN

## 2020-01-16 NOTE — Telephone Encounter (Signed)
Pt states she took a home COVID test and it came back positive. She states her grandson is also positive for COVID. Patient states that for the last week her legs & ankles have been extremely swollen. She is not currently on a diuretic. She denies any SOB or chest pain. She does not have her current weight or BP/HR. She is unsure is she has gained any weight. She denies any abdominal swelling.

## 2020-01-16 NOTE — Telephone Encounter (Signed)
Pt c/o swelling: STAT is pt has developed SOB within 24 hours  1) How much weight have you gained and in what time span? No  2) If swelling, where is the swelling located? Ankles  3) Are you currently taking a fluid pill? No  4) Are you currently SOB? Yes  5) Do you have a log of your daily weights (if so, list)? No  6) Have you gained 3 pounds in a day or 5 pounds in a week? 5 lbs in a week  7) Have you traveled recently? No

## 2020-01-16 NOTE — Telephone Encounter (Signed)
Called and spoke to patient. Made her aware of recommendations below. Rx sent to preferred pharmacy. She will take lasix & potassium QD x 3 days and then decrease to PRN. Instructed to elevate legs, wear compression stockings, and avoid salt. She verbalized understanding and thanked me for the call.

## 2020-01-16 NOTE — Telephone Encounter (Signed)
OK to start Lasix 40 mg daily prn swelling, with Kcl 20 mEq daily prn when taking Lasix.  Would take both meds for 3 days and see if swelling improves.  Elevate legs as well.  JV

## 2020-07-05 NOTE — Progress Notes (Unsigned)
Cardiology Office Note   Date:  07/06/2020   ID:  [name], [name] 20-Jun-1945, MRN [id number]  PCP:  Imagene Riches, NP    No chief complaint on file.  CAD  Wt Readings from Last 3 Encounters:  07/06/20 135 lb (61.2 kg)  10/02/19 135 lb 3.2 oz (61.3 kg)  09/17/18 136 lb (61.7 kg)       History of Present Illness: Kathryn Garrett is a 75 y.o. female   with history of HTN, HL, COPD with ongoing tobacco use, CAD status post inferior MI treated with bare-metal stent to the RCA in 2010,status post DES to the proximal to mid RCA 06/2010 for instent restenosis of the bare-metal stent.She had moderate left main/LAD disease which was not significant.  She was admitted in 9/17 for [place]. Cath with ISR of RCAfor which sheunderwent balloon angioplasty of RCA with ISR 75%->0%. Plavix wasrestarted.   She presented recently to [facility] on 06/04/16 with complaint of recurrent CP c/w unstable angina. Her CP was relieved with NTG.ECG with mild ST depression in inferior leads. 1st troponinwasnormal. Given her history and symptomatology, she was referred for LHC. She was found to have recurrent in-stent restenosis within the proximal RCA, treated with PTCA with scoring balloon.Repeat FFR on ostial LAD confirms thiswas not significant.She was continued on DAPT with ASA + Plavix and metoprolol. Smoking cessation was advised.   Of note, she was also found to have a right carotid bruit on exam. Subsequent doppler study showed bilateral ECA stenosis >50%but no significant ICA.  Between 2019 and today's visit, she has had some atypical chest discomfort.  She has felt some left arm pain at times.  No symptoms related to exertion.  She very rarely uses any nitroglycerin.  She had COVID in September 2021.  She was on oxygen for a while, all of the time.   Denies : Chest pain. Dizziness. Leg edema. Nitroglycerin use. Orthopnea. Palpitations. Paroxysmal nocturnal dyspnea. Shortness of breath.  Syncope.   She continues to have fatigue since COVID.  Started on 24 cg of Synthroid recently for hypothyroidism.   Past Medical History:  Diagnosis Date  . Anemia, mild   . Arthritis    "qwhere" (02/10/2016)  . CAD (coronary artery disease)    a. VF arrest 01/2009/CAD with inferoposterior MI s/p aspiration thrombectomy/BMS of RCA at that time. b. ISR of BMS s/p DES to RCA 06/2010 with moderate LM/LAD disease not significant by FFR. c. s/p angiosculpt PTCA to prox RCA 01/2016 for ISR. d. Aggressive PTCA to prox RCA for ISR 05/2016.  . Cardiac arrest ([place]) 01/2009   a. in setting of inf-post STEMI 01/2009 (VF).  . Chest pain 06/04/2016  . Chronic bronchitis ([place])   . Chronic combined systolic and diastolic CHF (congestive heart failure) ([place])    a. EF 40-45% by cath 2010. b. 60-65% with grade 1 DD by echo 09/2015  . COPD (chronic obstructive pulmonary disease) ([place])   . Hyperlipidemia 01/28/2016  . Hyperlipidemia LDL goal <70 01/28/2016  . Hypertension   . Lung mass   . MI (myocardial infarction) ([place]) 01/2009  . Narcotic abuse ([place])    pt now taking Suboxone tid  . Pleural effusion on left 09/24/2015  . Pneumonia 06/2014; 09/2015  . Pre-diabetes   . Pulmonary nodule   . PVD (peripheral vascular disease) ([facility])    mild atherosclerosis of infrarenal aorta, 25% ostial left renal artery stenosis, 50% ostial right common iliac by cath 2010  .  S/P PTCA (percutaneous transluminal coronary angioplasty) 02/10/16 to RCA lesion for in stent restenois 02/11/2016  . Subclavian artery stenosis ([facility])    a. >50% by duplex 05/2016.  . Tobacco abuse   . Unstable angina [facility])     Past Surgical History:  Procedure Laterality Date  . ABDOMINAL HYSTERECTOMY    . ANKLE FRACTURE SURGERY Right 2000s  . CARDIAC CATHETERIZATION N/A 02/10/2016   Procedure: Left Heart Cath and Coronary Angiography;  Surgeon: Jettie Booze, MD;  Location: [place] [facility];  Service: Cardiovascular;  Laterality: N/A;  . CARDIAC  CATHETERIZATION N/A 02/10/2016   Procedure: Coronary Balloon Angioplasty;  Surgeon: Jettie Booze, MD;  Location: [place] [facility];  Service: Cardiovascular;  Laterality: N/A;  instent RCA  . CARDIAC CATHETERIZATION N/A 06/05/2016   Procedure: Left Heart Cath and Coronary Angiography;  Surgeon: Leonie Man, MD;  Location: [place] [facility];  Service: Cardiovascular;  Laterality: N/A;  . CARDIAC CATHETERIZATION N/A 06/05/2016   Procedure: Coronary Balloon Angioplasty;  Surgeon: Leonie Man, MD;  Location: [place] [facility];  Service: Cardiovascular;  Laterality: N/A;  . CHEST TUBE INSERTION N/A 10/08/2015   Procedure: PLEURX CATH REMOVAL;  Surgeon: [name], MD;  Location: [facility];  Service: Thoracic;  Laterality: N/A;  . CORONARY ANGIOPLASTY WITH STENT PLACEMENT  01/2009; 2012  . FRACTURE SURGERY    . VIDEO ASSISTED THORACOSCOPY (VATS)/THOROCOTOMY Left 09/29/2015   Procedure: PREOP BRONCHOSCOPY, LEFT THORACOSCOPY, POSSIBLE THORACOTOMY, PLEURAL BIOPSY, TALC;  Surgeon: [name], MD;  Location: [facility];  Service: General;  Laterality: Left;     Current Outpatient Medications  Medication Sig Dispense Refill  . acetaminophen (TYLENOL) 500 MG tablet Take 1,000 mg by mouth every 6 (six) hours as needed for headache.    . albuterol (PROVENTIL HFA;VENTOLIN HFA) 108 (90 Base) MCG/ACT inhaler Inhale 2 puffs into the lungs every 4 (four) hours as needed for wheezing or shortness of breath.    Marland Kitchen aspirin EC 81 MG tablet Take 81 mg by mouth at bedtime.     Marland Kitchen azelastine (ASTELIN) 0.1 % nasal spray USE 2 SPRAYS BID UNTIL DIRECTED TO STOP. USE FOR RUNNY NOSE  0  . budesonide-formoterol (SYMBICORT) 160-4.5 MCG/ACT inhaler Inhale 1 puff into the lungs 2 (two) times daily. 3 Inhaler 0  . Buprenorphine HCl-Naloxone HCl 8-2 MG FILM Place 1 Film under the tongue 3 (three) times daily.    . cetirizine (ZYRTEC) 10 MG tablet Take 1 tablet (10 mg total) by mouth daily. 30 tablet 0  . clopidogrel  (PLAVIX) 75 MG tablet TAKE 1 TABLET(75 MG) BY MOUTH AT BEDTIME 90 tablet 2  . fluticasone (FLONASE) 50 MCG/ACT nasal spray Place 1 spray into both nostrils daily.    . furosemide (LASIX) 40 MG tablet Take 1 tablet (40 mg total) by mouth daily as needed (swelling). 90 tablet 3  . ibuprofen (ADVIL,MOTRIN) 600 MG tablet Take 600 mg by mouth as needed for pain.  0  . isosorbide mononitrate (IMDUR) 30 MG 24 hr tablet Take 1 tablet (30 mg total) by mouth daily. 90 tablet 3  . levothyroxine (SYNTHROID) 88 MCG tablet Take 88 mcg by mouth every morning.    . metoprolol tartrate (LOPRESSOR) 25 MG tablet Take 50 mg by mouth 2 (two) times daily.    . nitroGLYCERIN (NITROSTAT) 0.4 MG SL tablet Place 1 tablet (0.4 mg total) under the tongue every 5 (five) minutes as needed for chest pain. 25 tablet 4  . potassium  chloride SA (KLOR-CON) 20 MEQ tablet Take 1 tablet (20 mEq total) by mouth daily as needed (Take with furosemide (lasix)). 90 tablet 3  . rosuvastatin (CRESTOR) 20 MG tablet TAKE 1 TABLET(20 MG) BY MOUTH DAILY 90 tablet 2   No current facility-administered medications for this visit.    Allergies:   Azithromycin    Social History:  The patient  reports that she has been smoking cigarettes. She has a 28.00 pack-year smoking history. She has never used smokeless tobacco. She reports that she does not drink alcohol and does not use drugs.   Family History:  The patient's family history includes Cancer in her brother; Coronary artery disease in her father and mother; Early death in her father; Heart attack in her father and mother; Hyperlipidemia in her father and mother.    ROS:  Please see the history of present illness.   Otherwise, review of systems are positive for fatigue, worse since COVID.   All other systems are reviewed and negative.    PHYSICAL EXAM: VS:  BP 136/88   Pulse 88   Ht 4\' 11"  (1.499 m)   Wt 135 lb (61.2 kg)   SpO2 95%   BMI 27.27 kg/m  , BMI Body mass index is 27.27  kg/m. GEN: Well nourished, well developed, in no acute distress  HEENT: normal  Neck: no JVD, carotid bruits, or masses Cardiac: RRR; no murmurs, rubs, or gallops,no edema  Respiratory:  wheezing to auscultation bilaterally, normal work of breathing GI: soft, nontender, nondistended, + BS MS: no deformity or atrophy  Skin: warm and dry, no rash Neuro:  Strength and sensation are intact Psych: euthymic mood, full affect   EKG:   The ekg ordered today demonstrates NSR, no ST changes   Recent Labs: 10/02/2019: ALT 11; BUN 19; Creatinine, Ser 0.86; Potassium 5.0; Sodium 141   Lipid Panel    Component Value Date/Time   CHOL 107 10/02/2019 1228   CHOL 85 03/20/2013 0557   TRIG 127 10/02/2019 1228   TRIG 112 03/20/2013 0557   HDL 41 10/02/2019 1228   HDL 21 (L) 03/20/2013 0557   CHOLHDL 2.6 10/02/2019 1228   CHOLHDL 3.5 06/05/2016 0911   VLDL 24 06/05/2016 0911   VLDL 22 03/20/2013 0557   LDLCALC 43 10/02/2019 1228   LDLCALC 42 03/20/2013 0557     Other studies Reviewed: Additional studies/ records that were reviewed today with results demonstrating: labs reviewed, LDL 43 in 5/21.   ASSESSMENT AND PLAN:  1. CAD/Old MI: No angina. COntiue aggressive secondary prevention.  2. Tobacco abuse: Still smoking.  3. Hyperlipidemia: Whole food, plant based diet.  Avoid processed foods.  4. COPD: Needs to stop smoking.  5. Elevated blood glucose: A1C 6.4.  PreDM.  6. Hypothyroid: Now on Synthroid.   Current medicines are reviewed at length with the patient today.  The patient concerns regarding her medicines were addressed.  The following changes have been made:  No change  Labs/ tests ordered today include:  No orders of the defined types were placed in this encounter.   Recommend 150 minutes/week of aerobic exercise Low fat, low carb, high fiber diet recommended  Disposition:   FU in 1 year   Signed, Larae Grooms, MD  07/06/2020 2:25 PM    [facility] [address], [address], [place]  [address] Phone: [phone]; Fax: [phone]

## 2020-07-06 ENCOUNTER — Other Ambulatory Visit: Payer: Self-pay

## 2020-07-06 ENCOUNTER — Encounter: Payer: Self-pay | Admitting: Interventional Cardiology

## 2020-07-06 ENCOUNTER — Ambulatory Visit (INDEPENDENT_AMBULATORY_CARE_PROVIDER_SITE_OTHER): Payer: Medicare HMO | Admitting: Interventional Cardiology

## 2020-07-06 VITALS — BP 136/88 | HR 88 | Ht 59.0 in | Wt 135.0 lb

## 2020-07-06 DIAGNOSIS — I10 Essential (primary) hypertension: Secondary | ICD-10-CM

## 2020-07-06 DIAGNOSIS — E785 Hyperlipidemia, unspecified: Secondary | ICD-10-CM | POA: Diagnosis not present

## 2020-07-06 DIAGNOSIS — Z720 Tobacco use: Secondary | ICD-10-CM

## 2020-07-06 DIAGNOSIS — J449 Chronic obstructive pulmonary disease, unspecified: Secondary | ICD-10-CM

## 2020-07-06 DIAGNOSIS — I2510 Atherosclerotic heart disease of native coronary artery without angina pectoris: Secondary | ICD-10-CM | POA: Diagnosis not present

## 2020-07-06 NOTE — Patient Instructions (Signed)
Medication Instructions:  Your physician recommends that you continue on your current medications as directed. Please refer to the Current Medication list given to you today.  *If you need a refill on your cardiac medications before your next appointment, please call your pharmacy*   Lab Work: none If you have labs (blood work) drawn today and your tests are completely normal, you will receive your results only by: Marland Kitchen MyChart Message (if you have MyChart) OR . A paper copy in the mail If you have any lab test that is abnormal or we need to change your treatment, we will call you to review the results.   Testing/Procedures: none   Follow-Up: At [facility], you and your health needs are our priority.  As part of our continuing mission to provide you with exceptional heart care, we have created designated Provider Care Teams.  These Care Teams include your primary Cardiologist (physician) and Advanced Practice Providers (APPs -  Physician Assistants and Nurse Practitioners) who all work together to provide you with the care you need, when you need it.  We recommend signing up for the patient portal called "MyChart".  Sign up information is provided on this After Visit Summary.  MyChart is used to connect with patients for Virtual Visits (Telemedicine).  Patients are able to view lab/test results, encounter notes, upcoming appointments, etc.  Non-urgent messages can be sent to your provider as well.   To learn more about what you can do with MyChart, go to [email or web address].    Your next appointment:   12 month(s)  The format for your next appointment:   In Person  Provider:   You may see Larae Grooms, MD or one of the following Advanced Practice Providers on your designated Care Team:    Melina Copa, PA-C  Ermalinda Barrios, PA-C    Other Instructions  Dr Irish Lack recommends you quit smoking   High-Fiber Eating Plan Fiber, also called dietary fiber, is a type of  carbohydrate. It is found foods such as fruits, vegetables, whole grains, and beans. A high-fiber diet can have many health benefits. Your health care provider may recommend a high-fiber diet to help:  Prevent constipation. Fiber can make your bowel movements more regular.  Lower your cholesterol.  Relieve the following conditions: ? Inflammation of veins in the anus (hemorrhoids). ? Inflammation of specific areas of the digestive tract (uncomplicated diverticulosis). ? A problem of the large intestine, also called the colon, that sometimes causes pain and diarrhea (irritable bowel syndrome, or IBS).  Prevent overeating as part of a weight-loss plan.  Prevent heart disease, type 2 diabetes, and certain cancers. What are tips for following this plan? Reading food labels  Check the nutrition facts label on food products for the amount of dietary fiber. Choose foods that have 5 grams of fiber or more per serving.  The goals for recommended daily fiber intake include: ? Men (age 62 or younger): 34-38 g. ? Men (over age 89): 28-34 g. ? Women (age 31 or younger): 25-28 g. ? Women (over age 57): 22-25 g. Your daily fiber goal is _____________ g.   Shopping  Choose whole fruits and vegetables instead of processed forms, such as apple juice or applesauce.  Choose a wide variety of high-fiber foods such as avocados, lentils, oats, and kidney beans.  Read the nutrition facts label of the foods you choose. Be aware of foods with added fiber. These foods often have high sugar and sodium amounts per serving. Cooking  Use whole-grain flour for baking and cooking.  Cook with brown rice instead of white rice. Meal planning  Start the day with a breakfast that is high in fiber, such as a cereal that contains 5 g of fiber or more per serving.  Eat breads and cereals that are made with whole-grain flour instead of refined flour or white flour.  Eat brown rice, bulgur wheat, or millet instead  of white rice.  Use beans in place of meat in soups, salads, and pasta dishes.  Be sure that half of the grains you eat each day are whole grains. General information  You can get the recommended daily intake of dietary fiber by: ? Eating a variety of fruits, vegetables, grains, nuts, and beans. ? Taking a fiber supplement if you are not able to take in enough fiber in your diet. It is better to get fiber through food than from a supplement.  Gradually increase how much fiber you consume. If you increase your intake of dietary fiber too quickly, you may have bloating, cramping, or gas.  Drink plenty of water to help you digest fiber.  Choose high-fiber snacks, such as berries, raw vegetables, nuts, and popcorn. What foods should I eat? Fruits Berries. Pears. Apples. Oranges. Avocado. Prunes and raisins. Dried figs. Vegetables Sweet potatoes. Spinach. Kale. Artichokes. Cabbage. Broccoli. Cauliflower. Green peas. Carrots. Squash. Grains Whole-grain breads. Multigrain cereal. Oats and oatmeal. Brown rice. Barley. Bulgur wheat. Ferry. Quinoa. Bran muffins. Popcorn. Rye wafer crackers. Meats and other proteins Navy beans, kidney beans, and pinto beans. Soybeans. Split peas. Lentils. Nuts and seeds. Dairy Fiber-fortified yogurt. Beverages Fiber-fortified soy milk. Fiber-fortified orange juice. Other foods Fiber bars. The items listed above may not be a complete list of recommended foods and beverages. Contact a dietitian for more information. What foods should I avoid? Fruits Fruit juice. Cooked, strained fruit. Vegetables Fried potatoes. Canned vegetables. Well-cooked vegetables. Grains White bread. Pasta made with refined flour. White rice. Meats and other proteins Fatty cuts of meat. Fried chicken or fried fish. Dairy Milk. Yogurt. Cream cheese. Sour cream. Fats and oils Butters. Beverages Soft drinks. Other foods Cakes and pastries. The items listed above may not be a  complete list of foods and beverages to avoid. Talk with your dietitian about what choices are best for you. Summary  Fiber is a type of carbohydrate. It is found in foods such as fruits, vegetables, whole grains, and beans.  A high-fiber diet has many benefits. It can help to prevent constipation, lower blood cholesterol, aid weight loss, and reduce your risk of heart disease, diabetes, and certain cancers.  Increase your intake of fiber gradually. Increasing fiber too quickly may cause cramping, bloating, and gas. Drink plenty of water while you increase the amount of fiber you consume.  The best sources of fiber include whole fruits and vegetables, whole grains, nuts, seeds, and beans. This information is not intended to replace advice given to you by your health care provider. Make sure you discuss any questions you have with your health care provider. Document Revised: 09/04/2019 Document Reviewed: 09/04/2019 Elsevier Patient Education  2021 [employer].

## 2020-09-10 ENCOUNTER — Emergency Department: Payer: Medicare HMO

## 2020-09-10 ENCOUNTER — Other Ambulatory Visit: Payer: Self-pay

## 2020-09-10 ENCOUNTER — Inpatient Hospital Stay
Admission: EM | Admit: 2020-09-10 | Discharge: 2020-10-13 | DRG: 870 | Disposition: A | Discharge status: Home Health | Payer: Medicare HMO | Attending: Internal Medicine | Admitting: Internal Medicine

## 2020-09-10 DIAGNOSIS — I5041 Acute combined systolic (congestive) and diastolic (congestive) heart failure: Secondary | ICD-10-CM | POA: Diagnosis not present

## 2020-09-10 DIAGNOSIS — J9602 Acute respiratory failure with hypercapnia: Secondary | ICD-10-CM | POA: Diagnosis present

## 2020-09-10 DIAGNOSIS — I480 Paroxysmal atrial fibrillation: Secondary | ICD-10-CM | POA: Diagnosis present

## 2020-09-10 DIAGNOSIS — E785 Hyperlipidemia, unspecified: Secondary | ICD-10-CM | POA: Diagnosis present

## 2020-09-10 DIAGNOSIS — Z8249 Family history of ischemic heart disease and other diseases of the circulatory system: Secondary | ICD-10-CM

## 2020-09-10 DIAGNOSIS — R7881 Bacteremia: Secondary | ICD-10-CM | POA: Diagnosis not present

## 2020-09-10 DIAGNOSIS — J432 Centrilobular emphysema: Secondary | ICD-10-CM | POA: Diagnosis present

## 2020-09-10 DIAGNOSIS — R262 Difficulty in walking, not elsewhere classified: Secondary | ICD-10-CM | POA: Diagnosis present

## 2020-09-10 DIAGNOSIS — I739 Peripheral vascular disease, unspecified: Secondary | ICD-10-CM | POA: Diagnosis present

## 2020-09-10 DIAGNOSIS — Z720 Tobacco use: Secondary | ICD-10-CM | POA: Diagnosis not present

## 2020-09-10 DIAGNOSIS — Z1611 Resistance to penicillins: Secondary | ICD-10-CM | POA: Diagnosis present

## 2020-09-10 DIAGNOSIS — Z515 Encounter for palliative care: Secondary | ICD-10-CM | POA: Diagnosis not present

## 2020-09-10 DIAGNOSIS — Z20822 Contact with and (suspected) exposure to covid-19: Secondary | ICD-10-CM | POA: Diagnosis present

## 2020-09-10 DIAGNOSIS — I429 Cardiomyopathy, unspecified: Secondary | ICD-10-CM | POA: Diagnosis present

## 2020-09-10 DIAGNOSIS — R0603 Acute respiratory distress: Secondary | ICD-10-CM | POA: Diagnosis not present

## 2020-09-10 DIAGNOSIS — A419 Sepsis, unspecified organism: Secondary | ICD-10-CM | POA: Diagnosis present

## 2020-09-10 DIAGNOSIS — I493 Ventricular premature depolarization: Secondary | ICD-10-CM | POA: Diagnosis present

## 2020-09-10 DIAGNOSIS — J9600 Acute respiratory failure, unspecified whether with hypoxia or hypercapnia: Secondary | ICD-10-CM

## 2020-09-10 DIAGNOSIS — I248 Other forms of acute ischemic heart disease: Secondary | ICD-10-CM | POA: Diagnosis not present

## 2020-09-10 DIAGNOSIS — I428 Other cardiomyopathies: Secondary | ICD-10-CM | POA: Diagnosis present

## 2020-09-10 DIAGNOSIS — R739 Hyperglycemia, unspecified: Secondary | ICD-10-CM | POA: Diagnosis present

## 2020-09-10 DIAGNOSIS — I25110 Atherosclerotic heart disease of native coronary artery with unstable angina pectoris: Secondary | ICD-10-CM | POA: Diagnosis present

## 2020-09-10 DIAGNOSIS — I214 Non-ST elevation (NSTEMI) myocardial infarction: Secondary | ICD-10-CM

## 2020-09-10 DIAGNOSIS — G928 Other toxic encephalopathy: Secondary | ICD-10-CM | POA: Diagnosis present

## 2020-09-10 DIAGNOSIS — Z8674 Personal history of sudden cardiac arrest: Secondary | ICD-10-CM

## 2020-09-10 DIAGNOSIS — R0902 Hypoxemia: Secondary | ICD-10-CM | POA: Diagnosis not present

## 2020-09-10 DIAGNOSIS — J9601 Acute respiratory failure with hypoxia: Secondary | ICD-10-CM

## 2020-09-10 DIAGNOSIS — A4159 Other Gram-negative sepsis: Secondary | ICD-10-CM | POA: Diagnosis present

## 2020-09-10 DIAGNOSIS — Z7902 Long term (current) use of antithrombotics/antiplatelets: Secondary | ICD-10-CM

## 2020-09-10 DIAGNOSIS — E876 Hypokalemia: Secondary | ICD-10-CM | POA: Diagnosis not present

## 2020-09-10 DIAGNOSIS — R570 Cardiogenic shock: Secondary | ICD-10-CM | POA: Diagnosis not present

## 2020-09-10 DIAGNOSIS — F17210 Nicotine dependence, cigarettes, uncomplicated: Secondary | ICD-10-CM | POA: Diagnosis present

## 2020-09-10 DIAGNOSIS — R6520 Severe sepsis without septic shock: Secondary | ICD-10-CM | POA: Diagnosis present

## 2020-09-10 DIAGNOSIS — I471 Supraventricular tachycardia, unspecified: Secondary | ICD-10-CM

## 2020-09-10 DIAGNOSIS — F1110 Opioid abuse, uncomplicated: Secondary | ICD-10-CM | POA: Diagnosis present

## 2020-09-10 DIAGNOSIS — I5020 Unspecified systolic (congestive) heart failure: Secondary | ICD-10-CM

## 2020-09-10 DIAGNOSIS — E039 Hypothyroidism, unspecified: Secondary | ICD-10-CM | POA: Diagnosis present

## 2020-09-10 DIAGNOSIS — I110 Hypertensive heart disease with heart failure: Secondary | ICD-10-CM | POA: Diagnosis present

## 2020-09-10 DIAGNOSIS — I2510 Atherosclerotic heart disease of native coronary artery without angina pectoris: Secondary | ICD-10-CM | POA: Diagnosis not present

## 2020-09-10 DIAGNOSIS — Z978 Presence of other specified devices: Secondary | ICD-10-CM

## 2020-09-10 DIAGNOSIS — Z79891 Long term (current) use of opiate analgesic: Secondary | ICD-10-CM

## 2020-09-10 DIAGNOSIS — Z7982 Long term (current) use of aspirin: Secondary | ICD-10-CM

## 2020-09-10 DIAGNOSIS — Z6824 Body mass index (BMI) 24.0-24.9, adult: Secondary | ICD-10-CM | POA: Diagnosis not present

## 2020-09-10 DIAGNOSIS — E7800 Pure hypercholesterolemia, unspecified: Secondary | ICD-10-CM | POA: Diagnosis not present

## 2020-09-10 DIAGNOSIS — I5043 Acute on chronic combined systolic (congestive) and diastolic (congestive) heart failure: Secondary | ICD-10-CM | POA: Diagnosis present

## 2020-09-10 DIAGNOSIS — E43 Unspecified severe protein-calorie malnutrition: Secondary | ICD-10-CM | POA: Diagnosis present

## 2020-09-10 DIAGNOSIS — R6521 Severe sepsis with septic shock: Secondary | ICD-10-CM | POA: Diagnosis present

## 2020-09-10 DIAGNOSIS — J150 Pneumonia due to Klebsiella pneumoniae: Secondary | ICD-10-CM | POA: Diagnosis present

## 2020-09-10 DIAGNOSIS — J189 Pneumonia, unspecified organism: Secondary | ICD-10-CM

## 2020-09-10 DIAGNOSIS — I5021 Acute systolic (congestive) heart failure: Secondary | ICD-10-CM | POA: Diagnosis not present

## 2020-09-10 DIAGNOSIS — R0602 Shortness of breath: Secondary | ICD-10-CM

## 2020-09-10 DIAGNOSIS — J441 Chronic obstructive pulmonary disease with (acute) exacerbation: Secondary | ICD-10-CM | POA: Diagnosis not present

## 2020-09-10 DIAGNOSIS — Z83438 Family history of other disorder of lipoprotein metabolism and other lipidemia: Secondary | ICD-10-CM

## 2020-09-10 DIAGNOSIS — Z888 Allergy status to other drugs, medicaments and biological substances status: Secondary | ICD-10-CM

## 2020-09-10 DIAGNOSIS — Z79890 Hormone replacement therapy (postmenopausal): Secondary | ICD-10-CM

## 2020-09-10 DIAGNOSIS — I10 Essential (primary) hypertension: Secondary | ICD-10-CM | POA: Diagnosis not present

## 2020-09-10 DIAGNOSIS — Z881 Allergy status to other antibiotic agents status: Secondary | ICD-10-CM

## 2020-09-10 DIAGNOSIS — Z7951 Long term (current) use of inhaled steroids: Secondary | ICD-10-CM

## 2020-09-10 DIAGNOSIS — Z452 Encounter for adjustment and management of vascular access device: Secondary | ICD-10-CM

## 2020-09-10 DIAGNOSIS — Z955 Presence of coronary angioplasty implant and graft: Secondary | ICD-10-CM

## 2020-09-10 DIAGNOSIS — R7303 Prediabetes: Secondary | ICD-10-CM | POA: Diagnosis present

## 2020-09-10 DIAGNOSIS — A414 Sepsis due to anaerobes: Secondary | ICD-10-CM | POA: Diagnosis not present

## 2020-09-10 DIAGNOSIS — I5023 Acute on chronic systolic (congestive) heart failure: Secondary | ICD-10-CM | POA: Diagnosis not present

## 2020-09-10 DIAGNOSIS — Z7189 Other specified counseling: Secondary | ICD-10-CM | POA: Diagnosis not present

## 2020-09-10 DIAGNOSIS — J188 Other pneumonia, unspecified organism: Secondary | ICD-10-CM

## 2020-09-10 DIAGNOSIS — I252 Old myocardial infarction: Secondary | ICD-10-CM

## 2020-09-10 DIAGNOSIS — T380X5A Adverse effect of glucocorticoids and synthetic analogues, initial encounter: Secondary | ICD-10-CM | POA: Diagnosis present

## 2020-09-10 DIAGNOSIS — Z79899 Other long term (current) drug therapy: Secondary | ICD-10-CM

## 2020-09-10 DIAGNOSIS — B961 Klebsiella pneumoniae [K. pneumoniae] as the cause of diseases classified elsewhere: Secondary | ICD-10-CM | POA: Diagnosis not present

## 2020-09-10 DIAGNOSIS — Z7952 Long term (current) use of systemic steroids: Secondary | ICD-10-CM

## 2020-09-10 LAB — BLOOD GAS, VENOUS
Acid-base deficit: 14.4 mmol/L — ABNORMAL HIGH (ref 0.0–2.0)
Bicarbonate: 14.9 mmol/L — ABNORMAL LOW (ref 20.0–28.0)
Delivery systems: POSITIVE
FIO2: 100
Mechanical Rate: 16
O2 Saturation: 97.5 %
Patient temperature: 37
RATE: 16 resp/min
pCO2, Ven: 47 mmHg (ref 44.0–60.0)
pH, Ven: 7.11 — CL (ref 7.250–7.430)
pO2, Ven: 125 mmHg — ABNORMAL HIGH (ref 32.0–45.0)

## 2020-09-10 LAB — PROTIME-INR
INR: 1.4 — ABNORMAL HIGH (ref 0.8–1.2)
Prothrombin Time: 17 seconds — ABNORMAL HIGH (ref 11.4–15.2)

## 2020-09-10 LAB — TROPONIN I (HIGH SENSITIVITY)
Troponin I (High Sensitivity): 70 ng/L — ABNORMAL HIGH (ref <18)
Troponin I (High Sensitivity): 745 ng/L (ref <18)
Troponin I (High Sensitivity): 1838 ng/L (ref <18)

## 2020-09-10 LAB — COMPREHENSIVE METABOLIC PANEL
ALT: 42 U/L (ref 0–44)
AST: 37 U/L (ref 15–41)
Albumin: 3.7 g/dL (ref 3.5–5.0)
Alkaline Phosphatase: 93 U/L (ref 38–126)
Anion gap: 15 (ref 5–15)
BUN: 22 mg/dL (ref 8–23)
CO2: 15 mmol/L — ABNORMAL LOW (ref 22–32)
Calcium: 8.8 mg/dL — ABNORMAL LOW (ref 8.9–10.3)
Chloride: 106 mmol/L (ref 98–111)
Creatinine, Ser: 0.89 mg/dL (ref 0.44–1.00)
GFR, Estimated: >60 mL/min (ref >60)
Glucose, Bld: 350 mg/dL — ABNORMAL HIGH (ref 70–99)
Potassium: 3.8 mmol/L (ref 3.5–5.1)
Sodium: 136 mmol/L (ref 135–145)
Total Bilirubin: 0.7 mg/dL (ref 0.3–1.2)
Total Protein: 7 g/dL (ref 6.5–8.1)

## 2020-09-10 LAB — CBC
HCT: 42.8 % (ref 36.0–46.0)
Hemoglobin: 13.5 g/dL (ref 12.0–15.0)
MCH: 30.3 pg (ref 26.0–34.0)
MCHC: 31.5 g/dL (ref 30.0–36.0)
MCV: 96.2 fL (ref 80.0–100.0)
Platelets: 429 10*3/uL — ABNORMAL HIGH (ref 150–400)
RBC: 4.45 MIL/uL (ref 3.87–5.11)
RDW: 14.2 % (ref 11.5–15.5)
WBC: 40.2 10*3/uL — ABNORMAL HIGH (ref 4.0–10.5)
nRBC: 0.1 % (ref 0.0–0.2)

## 2020-09-10 LAB — RESP PANEL BY RT-PCR (FLU A&B, COVID) ARPGX2
Influenza A by PCR: NEGATIVE
Influenza B by PCR: NEGATIVE
SARS Coronavirus 2 by RT PCR: NEGATIVE

## 2020-09-10 LAB — LACTIC ACID, PLASMA
Lactic Acid, Venous: 2.5 mmol/L (ref 0.5–1.9)
Lactic Acid, Venous: 2.2 mmol/L (ref 0.5–1.9)
Lactic Acid, Venous: 2.5 mmol/L (ref 0.5–1.9)

## 2020-09-10 LAB — PROCALCITONIN: Procalcitonin: 0.12 ng/mL

## 2020-09-10 LAB — APTT: aPTT: 28 seconds (ref 24–36)

## 2020-09-10 LAB — BRAIN NATRIURETIC PEPTIDE: B Natriuretic Peptide: 222.6 pg/mL — ABNORMAL HIGH (ref 0.0–100.0)

## 2020-09-10 LAB — MAGNESIUM: Magnesium: 2.4 mg/dL (ref 1.7–2.4)

## 2020-09-10 IMAGING — DX DG CHEST 1V PORT
1 series · 1 of 1 positions shown · non-contrast
Comparison: [DATE].

CLINICAL DATA: Respiratory distress.

EXAM:
PORTABLE CHEST 1 VIEW

[chest ap]
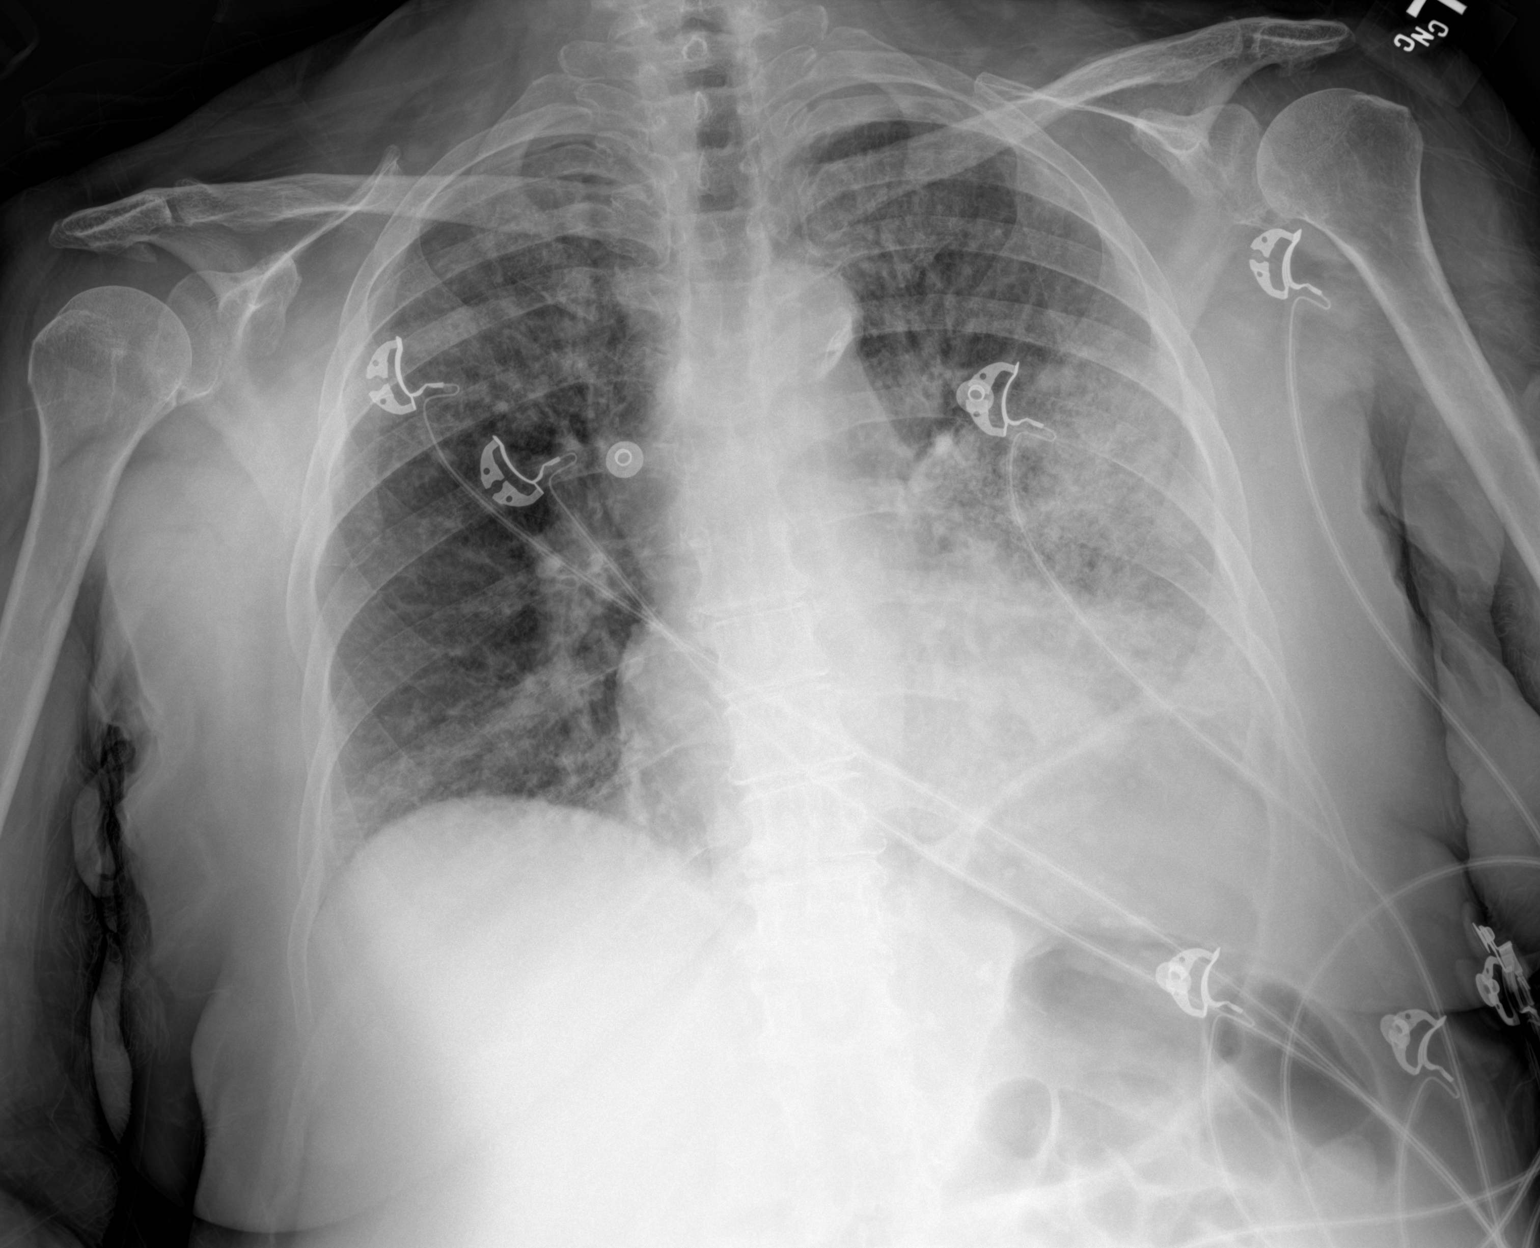

[1 of 1 positions shown; findings below may reference images not displayed]

FINDINGS: The heart size and mediastinal contours are within normal limits. No
pneumothorax or pleural effusion is noted. New large left lower lobe
airspace opacity is noted concerning for pneumonia. Minimal right
basilar atelectasis or infiltrate is noted. The visualized skeletal
structures are unremarkable.
IMPRESSION: New large left lower lobe airspace opacity is noted concerning for
pneumonia.

Aortic Atherosclerosis ([JP]-[JP]).

## 2020-09-10 MED ORDER — LEVOTHYROXINE SODIUM 88 MCG PO TABS
88.0000 ug | ORAL_TABLET | Freq: Every day | ORAL | Status: DC
  Filled 2020-09-10: qty 1

## 2020-09-10 MED ORDER — HEPARIN BOLUS VIA INFUSION
4000.0000 [IU] | Freq: Once | INTRAVENOUS | Status: AC
  Administered 2020-09-10: 4000 [IU] via INTRAVENOUS
  Filled 2020-09-10: qty 4000

## 2020-09-10 MED ORDER — INSULIN ASPART 100 UNIT/ML IJ SOLN: 0.0000 [IU] | Freq: Three times a day (TID) | INTRAMUSCULAR | Status: DC

## 2020-09-10 MED ORDER — VANCOMYCIN HCL 1500 MG/300ML IV SOLN
1500.0000 mg | Freq: Once | INTRAVENOUS | Status: AC
  Administered 2020-09-10: 1500 mg via INTRAVENOUS
  Filled 2020-09-10 (×2): qty 300

## 2020-09-10 MED ORDER — LACTATED RINGERS IV SOLN: INTRAVENOUS | Status: DC

## 2020-09-10 MED ORDER — IPRATROPIUM-ALBUTEROL 0.5-2.5 (3) MG/3ML IN SOLN
3.0000 mL | Freq: Four times a day (QID) | RESPIRATORY_TRACT | Status: DC
  Administered 2020-09-10 – 2020-09-20 (×38): 3 mL via RESPIRATORY_TRACT
  Filled 2020-09-10 (×39): qty 3

## 2020-09-10 MED ORDER — LORAZEPAM 2 MG/ML IJ SOLN: 0.5000 mg | INTRAMUSCULAR | Status: DC | PRN

## 2020-09-10 MED ORDER — VANCOMYCIN HCL 500 MG/100ML IV SOLN
500.0000 mg | INTRAVENOUS | Status: DC
  Administered 2020-09-11 – 2020-09-12 (×2): 500 mg via INTRAVENOUS
  Filled 2020-09-10 (×3): qty 100

## 2020-09-10 MED ORDER — ACETAMINOPHEN 325 MG PO TABS: 650.0000 mg | ORAL_TABLET | Freq: Four times a day (QID) | ORAL | Status: DC | PRN

## 2020-09-10 MED ORDER — METOPROLOL TARTRATE 50 MG PO TABS: 50.0000 mg | ORAL_TABLET | Freq: Two times a day (BID) | ORAL | Status: DC

## 2020-09-10 MED ORDER — SODIUM CHLORIDE 0.9 % IV SOLN
2.0000 g | Freq: Two times a day (BID) | INTRAVENOUS | Status: DC
  Administered 2020-09-11 – 2020-09-14 (×7): 2 g via INTRAVENOUS
  Filled 2020-09-10 (×9): qty 2

## 2020-09-10 MED ORDER — ISOSORBIDE MONONITRATE ER 30 MG PO TB24: 30.0000 mg | ORAL_TABLET | Freq: Every day | ORAL | Status: DC

## 2020-09-10 MED ORDER — ROSUVASTATIN CALCIUM 20 MG PO TABS
20.0000 mg | ORAL_TABLET | Freq: Every day | ORAL | Status: DC
  Filled 2020-09-10: qty 1

## 2020-09-10 MED ORDER — LISINOPRIL 5 MG PO TABS: 2.5000 mg | ORAL_TABLET | Freq: Every day | ORAL | Status: DC

## 2020-09-10 MED ORDER — ALBUTEROL SULFATE (2.5 MG/3ML) 0.083% IN NEBU: 2.5000 mg | INHALATION_SOLUTION | RESPIRATORY_TRACT | Status: DC | PRN

## 2020-09-10 MED ORDER — ASPIRIN EC 81 MG PO TBEC: 81.0000 mg | DELAYED_RELEASE_TABLET | Freq: Every day | ORAL | Status: DC

## 2020-09-10 MED ORDER — SODIUM CHLORIDE 0.9 % IV SOLN
500.0000 mg | Freq: Once | INTRAVENOUS | Status: DC
  Filled 2020-09-10: qty 500

## 2020-09-10 MED ORDER — SODIUM CHLORIDE 0.9 % IV SOLN
2.0000 g | Freq: Once | INTRAVENOUS | Status: AC
  Administered 2020-09-10: 2 g via INTRAVENOUS
  Filled 2020-09-10: qty 2

## 2020-09-10 MED ORDER — FLUTICASONE PROPIONATE 50 MCG/ACT NA SUSP
1.0000 | Freq: Every day | NASAL | Status: DC
  Administered 2020-09-14 – 2020-10-13 (×24): 1 via NASAL
  Filled 2020-09-10 (×2): qty 16

## 2020-09-10 MED ORDER — ACETAMINOPHEN 650 MG RE SUPP: 650.0000 mg | Freq: Four times a day (QID) | RECTAL | Status: DC | PRN

## 2020-09-10 MED ORDER — HEPARIN (PORCINE) 25000 UT/250ML-% IV SOLN
900.0000 [IU]/h | INTRAVENOUS | Status: DC
  Administered 2020-09-10: 800 [IU]/h via INTRAVENOUS
  Administered 2020-09-11: 900 [IU]/h via INTRAVENOUS
  Administered 2020-09-11: 800 [IU]/h via INTRAVENOUS
  Filled 2020-09-10 (×2): qty 250

## 2020-09-10 MED ORDER — CLOPIDOGREL BISULFATE 75 MG PO TABS: 75.0000 mg | ORAL_TABLET | Freq: Every day | ORAL | Status: DC

## 2020-09-10 MED ORDER — SODIUM CHLORIDE 0.9 % IV SOLN
100.0000 mg | Freq: Two times a day (BID) | INTRAVENOUS | Status: DC
  Administered 2020-09-10 – 2020-09-14 (×8): 100 mg via INTRAVENOUS
  Filled 2020-09-10 (×10): qty 100

## 2020-09-10 MED ORDER — METHYLPREDNISOLONE SODIUM SUCC 40 MG IJ SOLR
40.0000 mg | Freq: Four times a day (QID) | INTRAMUSCULAR | Status: DC
  Administered 2020-09-10 – 2020-09-11 (×2): 40 mg via INTRAVENOUS
  Filled 2020-09-10 (×2): qty 1

## 2020-09-10 MED ORDER — SODIUM CHLORIDE 0.9 % IV BOLUS
500.0000 mL | Freq: Once | INTRAVENOUS | Status: AC
  Administered 2020-09-10: 500 mL via INTRAVENOUS

## 2020-09-10 NOTE — ED Notes (Addendum)
MD Robinson at bedside 

## 2020-09-10 NOTE — Consult Note (Signed)
PHARMACY -  BRIEF ANTIBIOTIC NOTE   Pharmacy has received consult(s) for CAP from an ED provider.  The patient's profile has been reviewed for ht/wt/allergies/indication/available labs.    One time order(s) placed for cefepime  Further antibiotics/pharmacy consults should be ordered by admitting physician if indicated.                       Thank you, Oswald Hillock 09/10/2020  3:29 PM

## 2020-09-10 NOTE — ED Triage Notes (Signed)
Pt BIB EMS from work for acute respiratory distress. Pt is on 2L nasal cannula chronically. Pt attempted her inhaler without relief. Placed on C-PAP by EMS and given 125 solumedrol IV, 2g magnesium IV, and 2 duonebs. Pt on CPAP on arrival to ED, hx of pneumonia.

## 2020-09-10 NOTE — ED Provider Notes (Addendum)
[facility] Emergency Department Provider Note    Event Date/Time   First MD Initiated Contact with Patient 09/10/20 1417     (approximate)  I have reviewed the triage vital signs and the nursing notes.   HISTORY  Chief Complaint Respiratory Distress  Level V Caveat:  resp distress  HPI Kathryn Garrett is a 75 y.o. female with extensive past medical history as listed below presents to the ER in severe respiratory distress.   According to EMS patient had sudden onset of worsening shortness of breath while at work today.  Patient tachypneic placed on CPAP.  Given nebs as well as Solu-Medrol in route.   Past Medical History:  Diagnosis Date  . Anemia, mild   . Arthritis    "qwhere" (02/10/2016)  . CAD (coronary artery disease)    a. VF arrest 01/2009/CAD with inferoposterior MI s/p aspiration thrombectomy/BMS of RCA at that time. b. ISR of BMS s/p DES to RCA 06/2010 with moderate LM/LAD disease not significant by FFR. c. s/p angiosculpt PTCA to prox RCA 01/2016 for ISR. d. Aggressive PTCA to prox RCA for ISR 05/2016.  . Cardiac arrest ([place]) 01/2009   a. in setting of inf-post STEMI 01/2009 (VF).  . Chest pain 06/04/2016  . Chronic bronchitis ([place])   . Chronic combined systolic and diastolic CHF (congestive heart failure) ([place])    a. EF 40-45% by cath 2010. b. 60-65% with grade 1 DD by echo 09/2015  . COPD (chronic obstructive pulmonary disease) ([place])   . Hyperlipidemia 01/28/2016  . Hyperlipidemia LDL goal <70 01/28/2016  . Hypertension   . Lung mass   . MI (myocardial infarction) ([place]) 01/2009  . Narcotic abuse ([place])    pt now taking Suboxone tid  . Pleural effusion on left 09/24/2015  . Pneumonia 06/2014; 09/2015  . Pre-diabetes   . Pulmonary nodule   . PVD (peripheral vascular disease) (HCC)    mild atherosclerosis of infrarenal aorta, 25% ostial left renal artery stenosis, 50% ostial right common iliac by cath 2010  . S/P PTCA (percutaneous transluminal  coronary angioplasty) 02/10/16 to RCA lesion for in stent restenois 02/11/2016  . Subclavian artery stenosis (HCC)    a. >50% by duplex 05/2016.  . Tobacco abuse   . Unstable angina (HCC)    Family History  Problem Relation Age of Onset  . Coronary artery disease Father   . Hyperlipidemia Father   . Early death Father   . Heart attack Father   . Coronary artery disease Mother   . Hyperlipidemia Mother   . Heart attack Mother   . Cancer Brother    Past Surgical History:  Procedure Laterality Date  . ABDOMINAL HYSTERECTOMY    . ANKLE FRACTURE SURGERY Right 2000s  . CARDIAC CATHETERIZATION N/A 02/10/2016   Procedure: Left Heart Cath and Coronary Angiography;  Surgeon: Jettie Booze, MD;  Location: [place] [facility];  Service: Cardiovascular;  Laterality: N/A;  . CARDIAC CATHETERIZATION N/A 02/10/2016   Procedure: Coronary Balloon Angioplasty;  Surgeon: Jettie Booze, MD;  Location: [place] [facility];  Service: Cardiovascular;  Laterality: N/A;  instent RCA  . CARDIAC CATHETERIZATION N/A 06/05/2016   Procedure: Left Heart Cath and Coronary Angiography;  Surgeon: Leonie Man, MD;  Location: [place] [facility];  Service: Cardiovascular;  Laterality: N/A;  . CARDIAC CATHETERIZATION N/A 06/05/2016   Procedure: Coronary Balloon Angioplasty;  Surgeon: Leonie Man, MD;  Location: [place] [facility];  Service: Cardiovascular;  Laterality: N/A;  . CHEST TUBE INSERTION N/A 10/08/2015   Procedure: PLEURX CATH REMOVAL;  Surgeon: Hulda Marin, MD;  Location: [facility] ORS;  Service: Thoracic;  Laterality: N/A;  . CORONARY ANGIOPLASTY WITH STENT PLACEMENT  01/2009; 2012  . FRACTURE SURGERY    . VIDEO ASSISTED THORACOSCOPY (VATS)/THOROCOTOMY Left 09/29/2015   Procedure: PREOP BRONCHOSCOPY, LEFT THORACOSCOPY, POSSIBLE THORACOTOMY, PLEURAL BIOPSY, TALC;  Surgeon: Hulda Marin, MD;  Location: [facility] OR[facility];  Service: General;  Laterality: Left;   Patient Active Problem List   Diagnosis Date Noted   . Chronic obstructive pulmonary disease (HCC) 03/05/2018  . History of opioid abuse (HCC) 03/05/2018  . Nocturnal hypoxemia 10/30/2017  . Peripheral arterial disease (HCC) 08/30/2016  . Chest pain 06/04/2016  . S/P PTCA (percutaneous transluminal coronary angioplasty) 02/10/16 to RCA lesion for in stent restenois 02/11/2016  . Unstable angina (HCC)   . Hyperlipidemia LDL goal <70 01/28/2016  . Presence of coronary angioplasty implant and graft 10/15/2015  . Tobacco abuse 10/15/2015  . CAD (coronary artery disease) 09/24/2015  . HTN (hypertension) 09/24/2015  . Lung nodule, solitary 03/31/2014  . Liver nodule 03/31/2014      Prior to Admission medications   Medication Sig Start Date End Date Taking? Authorizing Provider  acetaminophen (TYLENOL) 500 MG tablet Take 1,000 mg by mouth every 6 (six) hours as needed for headache.   Yes [provider]  albuterol (PROVENTIL HFA;VENTOLIN HFA) 108 (90 Base) MCG/ACT inhaler Inhale 2 puffs into the lungs every 4 (four) hours as needed for wheezing or shortness of breath.   Yes [provider]  amoxicillin (AMOXIL) 500 MG tablet Take 500 mg by mouth 3 (three) times daily. 08/03/20  Yes [provider]  aspirin EC 81 MG tablet Take 81 mg by mouth at bedtime.    Yes [provider]  azelastine (ASTELIN) 0.1 % nasal spray USE 2 SPRAYS BID UNTIL DIRECTED TO STOP. USE FOR RUNNY NOSE 08/14/17  Yes [provider]  budesonide-formoterol (SYMBICORT) 160-4.5 MCG/ACT inhaler Inhale 1 puff into the lungs 2 (two) times daily. 03/05/18  Yes Doreene Nest, NP  Buprenorphine HCl-Naloxone HCl 8-2 MG FILM Place 1 Film under the tongue 3 (three) times daily.   Yes [provider]  cetirizine (ZYRTEC) 10 MG tablet Take 1 tablet (10 mg total) by mouth daily. 03/11/18  Yes Camila Li, Sahar M, PA-C  clopidogrel (PLAVIX) 75 MG tablet TAKE 1 TABLET(75 MG) BY MOUTH AT BEDTIME 12/23/19  Yes Corky Crafts, MD   dexamethasone (DECADRON) 6 MG tablet dexamethasone 6 mg tablet  TAKE 1 TABLET BY MOUTH EVERY DAY   Yes [provider]  doxycycline (VIBRAMYCIN) 100 MG capsule doxycycline hyclate 100 mg capsule  TAKE 1 CAPSULE BY MOUTH TWICE DAILY   Yes [provider]  fluticasone (FLONASE) 50 MCG/ACT nasal spray Place 1 spray into both nostrils daily.   Yes [provider]  furosemide (LASIX) 40 MG tablet Take 1 tablet (40 mg total) by mouth daily as needed (swelling). 01/16/20  Yes Corky Crafts, MD  ibuprofen (ADVIL,MOTRIN) 600 MG tablet Take 600 mg by mouth as needed for pain. 08/14/17  Yes [provider]  isosorbide mononitrate (IMDUR) 30 MG 24 hr tablet Take 1 tablet (30 mg total) by mouth daily. 10/02/19  Yes Corky Crafts, MD  levothyroxine (SYNTHROID) 88 MCG tablet Take 88 mcg by mouth every morning. 05/26/20  Yes [provider]  metoprolol tartrate (LOPRESSOR) 25 MG tablet Take 50 mg by mouth  2 (two) times daily.   Yes [provider]  mupirocin ointment (BACTROBAN) 2 % mupirocin 2 % topical ointment  APPLY TOPICALLY TO LOWER LEG THREE TIMES DAILY FOR 10 DAYS   Yes [provider]  nystatin cream (MYCOSTATIN) Apply topically 2 (two) times daily. 08/11/20  Yes [provider]  potassium chloride SA (KLOR-CON) 20 MEQ tablet Take 1 tablet (20 mEq total) by mouth daily as needed (Take with furosemide (lasix)). 01/16/20  Yes Jettie Booze, MD  predniSONE (DELTASONE) 20 MG tablet prednisone 20 mg tablet   Yes [provider]  rosuvastatin (CRESTOR) 20 MG tablet TAKE 1 TABLET(20 MG) BY MOUTH DAILY 12/23/19  Yes Jettie Booze, MD  TRELEGY ELLIPTA 100-62.5-25 MCG/INH AEPB Take 1 puff by mouth daily. 08/13/20  Yes [provider]  triamcinolone cream (KENALOG) 0.1 % Apply topically 2 (two) times daily. to affected area 08/11/20  Yes [provider]  nitroGLYCERIN (NITROSTAT) 0.4 MG SL tablet Place  1 tablet (0.4 mg total) under the tongue every 5 (five) minutes as needed for chest pain. 03/12/18   Jettie Booze, MD    Allergies Azithromycin    Social History Social History   Tobacco Use  . Smoking status: Current Every Day Smoker    Packs/day: 0.50    Years: 56.00    Pack years: 28.00    Types: Cigarettes  . Smokeless tobacco: Never Used  Vaping Use  . Vaping Use: Never used  Substance Use Topics  . Alcohol use: No    Alcohol/week: 0.0 standard drinks  . Drug use: No    Review of Systems Patient denies headaches, rhinorrhea, blurry vision, numbness, shortness of breath, chest pain, edema, cough, abdominal pain, nausea, vomiting, diarrhea, dysuria, fevers, rashes or hallucinations unless otherwise stated above in HPI. ____________________________________________   PHYSICAL EXAM:  VITAL SIGNS: Vitals:   09/10/20 1445 09/10/20 1500  BP:  127/72  Pulse: (!) 110 (!) 108  Resp: (!) 22 (!) 22  Temp:    SpO2: 98% 99%    Constitutional: Alert, critically ill-appearing Eyes: Conjunctivae are normal.  Head: Atraumatic. Nose: No congestion/rhinnorhea. Mouth/Throat: Mucous membranes are dry.   Neck: No stridor. Painless ROM.  Cardiovascular: tachy rate, regular rhythm. Grossly normal heart sounds.  Good peripheral circulation. Respiratory: Significant tachypnea with acute respiratory distress expiratory wheeze Gastrointestinal: Soft and nontender. No distention. No abdominal bruits. No CVA tenderness. Genitourinary:  Musculoskeletal: No lower extremity tenderness nor edema.  No joint effusions. Neurologic:  Normal speech and language. No gross focal neurologic deficits are appreciated. No facial droop Skin:  Skin is warm, dry and intact. No rash noted. Psychiatric: unable to assess.  ____________________________________________   LABS (all labs ordered are listed, but only abnormal results are displayed)  Results for orders placed or performed during the  hospital encounter of 09/10/20 (from the past 24 hour(s))  CBC     Status: Abnormal   Collection Time: 09/10/20  2:17 PM  Result Value Ref Range   WBC 40.2 (H) 4.0 - 10.5 K/uL   RBC 4.45 3.87 - 5.11 MIL/uL   Hemoglobin 13.5 12.0 - 15.0 g/dL   HCT 42.8 36.0 - 46.0 %   MCV 96.2 80.0 - 100.0 fL   MCH 30.3 26.0 - 34.0 pg   MCHC 31.5 30.0 - 36.0 g/dL   RDW 14.2 11.5 - 15.5 %   Platelets 429 (H) 150 - 400 K/uL   nRBC 0.1 0.0 - 0.2 %  Comprehensive metabolic panel  Status: Abnormal   Collection Time: 09/10/20  2:17 PM  Result Value Ref Range   Sodium 136 135 - 145 mmol/L   Potassium 3.8 3.5 - 5.1 mmol/L   Chloride 106 98 - 111 mmol/L   CO2 15 (L) 22 - 32 mmol/L   Glucose, Bld 350 (H) 70 - 99 mg/dL   BUN 22 8 - 23 mg/dL   Creatinine, Ser 0.89 0.44 - 1.00 mg/dL   Calcium 8.8 (L) 8.9 - 10.3 mg/dL   Total Protein 7.0 6.5 - 8.1 g/dL   Albumin 3.7 3.5 - 5.0 g/dL   AST 37 15 - 41 U/L   ALT 42 0 - 44 U/L   Alkaline Phosphatase 93 38 - 126 U/L   Total Bilirubin 0.7 0.3 - 1.2 mg/dL   GFR, Estimated >60 >60 mL/min   Anion gap 15 5 - 15  Troponin I (High Sensitivity)     Status: Abnormal   Collection Time: 09/10/20  2:17 PM  Result Value Ref Range   Troponin I (High Sensitivity) 70 (H) <18 ng/L  Blood gas, venous     Status: Abnormal   Collection Time: 09/10/20  2:18 PM  Result Value Ref Range   FIO2 100.00    Delivery systems BILEVEL POSITIVE AIRWAY PRESSURE    LHR 16 resp/min   pH, Ven 7.11 (LL) 7.250 - 7.430   pCO2, Ven 47 44.0 - 60.0 mmHg   pO2, Ven 125.0 (H) 32.0 - 45.0 mmHg   Bicarbonate 14.9 (L) 20.0 - 28.0 mmol/L   Acid-base deficit 14.4 (H) 0.0 - 2.0 mmol/L   O2 Saturation 97.5 %   Patient temperature 37.0    Collection site VENOUS    Sample type VENOUS    Mechanical Rate 16   Resp Panel by RT-PCR (Flu A&B, Covid) Nasopharyngeal Swab     Status: None   Collection Time: 09/10/20  2:18 PM   Specimen: Nasopharyngeal Swab; Nasopharyngeal(NP) swabs in vial transport medium   Result Value Ref Range   SARS Coronavirus 2 by RT PCR NEGATIVE NEGATIVE   Influenza A by PCR NEGATIVE NEGATIVE   Influenza B by PCR NEGATIVE NEGATIVE   ____________________________________________  EKG My review and personal interpretation at Time: 14:18   Indication: sob  Rate: 135  Rhythm: sobn Axis: normal Other: normal intervals, no stemi, nonspecific st abn, likely rate dependent ____________________________________________  RADIOLOGY  I personally reviewed all radiographic images ordered to evaluate for the above acute complaints and reviewed radiology reports and findings.  These findings were personally discussed with the patient.  Please see medical record for radiology report.  ____________________________________________   PROCEDURES  Procedure(s) performed:  .Critical Care Performed by: Merlyn Lot, MD Authorized by: Merlyn Lot, MD   Critical care provider statement:    Critical care time (minutes):  35   Critical care time was exclusive of:  Separately billable procedures and treating other patients   Critical care was necessary to treat or prevent imminent or life-threatening deterioration of the following conditions:  Respiratory failure   Critical care was time spent personally by me on the following activities:  Development of treatment plan with patient or surrogate, discussions with consultants, evaluation of patient's response to treatment, examination of patient, obtaining history from patient or surrogate, ordering and performing treatments and interventions, ordering and review of laboratory studies, ordering and review of radiographic studies, pulse oximetry, re-evaluation of patient's condition and review of old charts      Critical Care performed: yes ____________________________________________  INITIAL IMPRESSION / ASSESSMENT AND PLAN / ED COURSE  Pertinent labs & imaging results that were available during my care of the patient  were reviewed by me and considered in my medical decision making (see chart for details).   DDX: Asthma, copd, CHF, pna, ptx, malignancy, Pe, anemia   VLADA URIOSTEGUI is a 75 y.o. who presents to the ED with acute respiratory distress with hypoxia.  Patient transitioned over to BiPAP.  Protecting her airway but in severe respiratory distress concerning for requiring intubation.  No lower extremity edema.  Does have wheezing on exam.  After close bedside monitoring patient is starting to turn around on BiPAP saying that she is feeling better went from not being able to speak in more than yes or no responses to now providing some history.  States that she has had some cough.  We will continue to observe on BiPAP order blood work and sent for the but differential.  Clinical Course as of 09/10/20 1541  Fri Sep 10, 2020  1425 Patient appears to be clinically improving on BiPAP. [PR]  1435 Patient continuing to improve.  Now speaking more complete sentences. [PR]  1505 Potassium: 3.8 Chest x-ray with evidence of pneumonia while or antibiotics.  Significantly elevated white count.  Troponin elevated likely secondary to hypoxic demand ischemia. [PR]  1539 Patient with significant improvement.  Broad-spectrum antibiotics ordered.  Will discuss with hospitalist for admission. [PR]    Clinical Course User Index [PR] Merlyn Lot, MD    The patient was evaluated in Emergency Department today for the symptoms described in the history of present illness. He/she was evaluated in the context of the global COVID-19 pandemic, which necessitated consideration that the patient might be at risk for infection with the SARS-CoV-2 virus that causes COVID-19. Institutional protocols and algorithms that pertain to the evaluation of patients at risk for COVID-19 are in a state of rapid change based on information released by regulatory bodies including the CDC and federal and state organizations. These policies and  algorithms were followed during the patient's care in the ED.  As part of my medical decision making, I reviewed the following data within the [facility] notes reviewed and incorporated, Labs reviewed, notes from prior ED visits and [place] Controlled Substance Database   ____________________________________________   FINAL CLINICAL IMPRESSION(S) / ED DIAGNOSES  Final diagnoses:  Acute respiratory distress  Pneumonia of left lower lobe due to infectious organism      NEW MEDICATIONS STARTED DURING THIS VISIT:  New Prescriptions   No medications on file     Note:  This document was prepared using Dragon voice recognition software and may include unintentional dictation errors.       Merlyn Lot, MD 09/10/20 [phone]

## 2020-09-10 NOTE — ED Notes (Signed)
Pharmacy called to send heparin infusion to ED.

## 2020-09-10 NOTE — ED Notes (Signed)
This RN entered room to pt's BiPAP alarming, BiPAP was disconnected at mask and pt was tachypneic in 40s and tachycardic in 120s with complaint of shortness of breath with O2 saturation of 89%. BiPAP reconnected and pt's O2 increased to 97%, heart rate decreased to 115, respiration rate decreased to 30.

## 2020-09-10 NOTE — ED Notes (Addendum)
Per lab lactic acid is 2.5 and troponin is 745, MD paged to make aware.

## 2020-09-10 NOTE — ED Notes (Signed)
Amy, daughter phone [phone]

## 2020-09-10 NOTE — Consult Note (Signed)
Pharmacy Antibiotic Note  Kathryn Garrett is a 75 y.o. female admitted on 09/10/2020 with pneumonia and sepsis.  Pharmacy has been consulted for cefepime and vancomycin dosing.  Plan: Cefepime 2g q12H   Will give vancomycin 1500 mg loading dose followed by 500 mg q24H. Predicted AUC of 435, Goal AUC 400-550. Scr used 0.89. Vd 0.5, (BMI > 30), Plan to order vancomycin level in 4-5 days.    Height: 4\' 11"  (149.9 cm) Weight: 68.2 kg (150 lb 6.4 oz) IBW/kg (Calculated) : 43.2  Temp (24hrs), Avg:98 F (36.7 C), Min:98 F (36.7 C), Max:98 F (36.7 C)  Recent Labs  Lab 09/10/20 1417 09/10/20 1600  WBC 40.2*  --   CREATININE 0.89  --   LATICACIDVEN  --  2.5*    Estimated Creatinine Clearance: 45.9 mL/min (by C-G formula based on SCr of 0.89 mg/dL).    Allergies  Allergen Reactions  . Azithromycin Rash    Antimicrobials this admission: 4/29 cefepime >>  4/29 vancomycin >>   Dose adjustments this admission: None  Microbiology results: 4/29 BCx: pending  Thank you for allowing pharmacy to be a part of this patient's care.  Oswald Hillock, PharmD, BCPS 09/10/2020 5:42 PM

## 2020-09-10 NOTE — ED Notes (Signed)
Pt unable to sing MSE waiver due to critical condition.

## 2020-09-10 NOTE — ED Notes (Signed)
Pt changed to BiPAP by respiratory therapist.

## 2020-09-10 NOTE — ED Notes (Signed)
Pt resting in bed in NAD, BiPAP in place, a/o x 4, GCS 15; explained importance of keeping BiPAP mask on, pt verbalized understanding

## 2020-09-10 NOTE — Progress Notes (Signed)
Brief note regarding plan, with full H&P to follow:  75 year old female with history of COPD, who is admitted to [facility] today for acute COPD exacerbation in the setting of pneumonia after presenting to [facility] ED complaining of 2 to 3 days of progressive shortness of breath associated with new onset cough.  COVID-negative.  On BiPAP, and appearing much more comfortable with current settings of 12/6 and 100% FiO2.  On scheduled duo nebulizer treatments, as needed albuterol nebulizer, and Solu-Medrol as well as broad-spectrum antibiotics.     Babs Bertin, DO Hospitalist

## 2020-09-10 NOTE — Consult Note (Signed)
ANTICOAGULATION CONSULT NOTE - Initial Consult  Pharmacy Consult for Heparin Indication: chest pain/ACS  Allergies  Allergen Reactions  . Azithromycin Rash    Patient Measurements: Height: 4\' 11"  (149.9 cm) Weight: 68.2 kg (150 lb 6.4 oz) IBW/kg (Calculated) : 43.2 Heparin Dosing Weight: 68.2 kg  Vital Signs: Temp: 98 F (36.7 C) (04/29 1418) Temp Source: Axillary (04/29 1418) BP: 117/75 (04/29 1800) Pulse Rate: 92 (04/29 1800)  Labs: Recent Labs    09/10/20 1417 09/10/20 1600  HGB 13.5  --   HCT 42.8  --   PLT 429*  --   APTT  --  28  CREATININE 0.89  --   TROPONINIHS 70* 745*    Estimated Creatinine Clearance: 45.9 mL/min (by C-G formula based on SCr of 0.89 mg/dL).   Medical History: Past Medical History:  Diagnosis Date  . Anemia, mild   . Arthritis    "qwhere" (02/10/2016)  . CAD (coronary artery disease)    a. VF arrest 01/2009/CAD with inferoposterior MI s/p aspiration thrombectomy/BMS of RCA at that time. b. ISR of BMS s/p DES to RCA 06/2010 with moderate LM/LAD disease not significant by FFR. c. s/p angiosculpt PTCA to prox RCA 01/2016 for ISR. d. Aggressive PTCA to prox RCA for ISR 05/2016.  . Cardiac arrest ([place]) 01/2009   a. in setting of inf-post STEMI 01/2009 (VF).  . Chest pain 06/04/2016  . Chronic bronchitis ([place])   . Chronic combined systolic and diastolic CHF (congestive heart failure) ([place])    a. EF 40-45% by cath 2010. b. 60-65% with grade 1 DD by echo 09/2015  . COPD (chronic obstructive pulmonary disease) ([place])   . Hyperlipidemia 01/28/2016  . Hyperlipidemia LDL goal <70 01/28/2016  . Hypertension   . Lung mass   . MI (myocardial infarction) ([place]) 01/2009  . Narcotic abuse ([place])    pt now taking Suboxone tid  . Pleural effusion on left 09/24/2015  . Pneumonia 06/2014; 09/2015  . Pre-diabetes   . Pulmonary nodule   . PVD (peripheral vascular disease) (HCC)    mild atherosclerosis of infrarenal aorta, 25% ostial left renal artery stenosis, 50%  ostial right common iliac by cath 2010  . S/P PTCA (percutaneous transluminal coronary angioplasty) 02/10/16 to RCA lesion for in stent restenois 02/11/2016  . Subclavian artery stenosis (HCC)    a. >50% by duplex 05/2016.  . Tobacco abuse   . Unstable angina (HCC)     Medications:  (Not in a hospital admission)  Scheduled:  . ipratropium-albuterol  3 mL Nebulization Q6H  . methylPREDNISolone (SOLU-MEDROL) injection  40 mg Intravenous Q6H   Infusions:  . [START ON 09/11/2020] ceFEPime (MAXIPIME) IV    . doxycycline (VIBRAMYCIN) IV 100 mg (09/10/20 1744)  . lactated ringers 150 mL/hr at 09/10/20 1742  . vancomycin    . [START ON 09/11/2020] vancomycin     PRN: acetaminophen **OR** acetaminophen, albuterol Anti-infectives (From admission, onward)   Start     Dose/Rate Route Frequency Ordered Stop   09/11/20 1800  vancomycin (VANCOREADY) IVPB 500 mg/100 mL        500 mg 100 mL/hr over 60 Minutes Intravenous Every 24 hours 09/10/20 1754     09/11/20 0600  ceFEPIme (MAXIPIME) 2 g in sodium chloride 0.9 % 100 mL IVPB        2 g 200 mL/hr over 30 Minutes Intravenous Every 12 hours 09/10/20 1754     09/10/20 1900  vancomycin (VANCOREADY) IVPB 1500 mg/300 mL  1,500 mg 150 mL/hr over 120 Minutes Intravenous  Once 09/10/20 1754     09/10/20 1800  doxycycline (VIBRAMYCIN) 100 mg in sodium chloride 0.9 % 250 mL IVPB        100 mg 125 mL/hr over 120 Minutes Intravenous Every 12 hours 09/10/20 1703     09/10/20 1530  ceFEPIme (MAXIPIME) 2 g in sodium chloride 0.9 % 100 mL IVPB        2 g 200 mL/hr over 30 Minutes Intravenous  Once 09/10/20 1516 09/10/20 1633   09/10/20 1530  azithromycin (ZITHROMAX) 500 mg in sodium chloride 0.9 % 250 mL IVPB  Status:  Discontinued        500 mg 250 mL/hr over 60 Minutes Intravenous  Once 09/10/20 1516 09/10/20 1702      Assessment: Pharmacy consulted to start heparin for NSTEMI. Trop 745. No DOAC PTA noted.   Goal of Therapy:  Heparin level  0.3-0.7 units/ml Monitor platelets by anticoagulation protocol: Yes   Plan:  Give 4000 units bolus x 1 Start heparin infusion at 800 units/hr Check anti-Xa level in 8 hours and daily while on heparin Continue to monitor H&H and platelets  Oswald Hillock, PharmD, BCPS 09/10/2020,6:06 PM

## 2020-09-10 NOTE — H&P (Signed)
History and Physical    PLEASE NOTE THAT DRAGON DICTATION SOFTWARE WAS USED IN THE CONSTRUCTION OF THIS NOTE.   JALEENA VIVIANI DDU:[id number] DOB: 02/19/1946 DOA: 09/10/2020  PCP: Imagene Riches, NP Patient coming from: home   I have personally briefly reviewed patient's old medical records in [place]  Chief Complaint: Shortness of breath  HPI: Kathryn Garrett is a 75 y.o. female with medical history significant for coronary artery disease status post RCA stent with ensuing recurrent in-stent restenosis of RCA stent, most recently in 2376, chronic diastolic heart failure, COPD, hypertension, hyperlipidemia, who is admitted to [facility] on 09/10/2020 with acute COPD exacerbation after presenting from home to [facility] ED complaining of shortness of breath.   The patient reports 2 to 3 days of progressive shortness of breath associated with new onset nonproductive cough and subjective fever.  Denies any associated chest pain, palpitations, diaphoresis, nausea, vomiting, dizziness, presyncope, or syncope.  She also denies any associated orthopnea, PND, new lower extremity edema, calf tenderness, lower extremity erythema, or hemoptysis.  Denies associated chills, full body rigors, or generalized myalgias.  No recent trauma, surgical procedures, or periods of prolonged diminished amatory status.  No recent melena or hematochezia.  No recent traveling or known COVID-19 exposures.  She denies any associated abdominal pain, diarrhea, or rash.  She also denies any recent dysuria, gross hematuria, or change in urinary urgency/frequency.  Denies any recent rhinitis, rhinorrhea, sore throat, or wheezing.  She confirms a history of COPD in the context of a long smoking history, for which she admits that she has continuing to smoke.  Not on any supplemental oxygen at baseline.  Her medical history is notable for CAD status post stent to the RCA, PTCA to the RCA for in-stent  restenosis in September 2017, aggressive angioplasty of recurrent in-stent restenosis of stent in proximal RCA in January 2018, which represents her most recent cardiac cath.  She reports compliance with lifelong dual antiplatelet therapy with baby aspirin and Plavix.  She also has a history of chronic diastolic heart failure, with most recent echocardiogram in May 2017 showing LVEF 60 to 65% normal left ventricle cavity size, no focal wall motion abnormalities, grade 1 diastolic dysfunction in the absence of any significant valvular pathology.  She has followed with [facility] cardiology, including Dr. Irish Lack.   In the setting of progression of her shortness of breath, the patient contacted EMS earlier today, who, while not reporting in initial oxygen saturation, with the patient on CPAP in the setting of noted tachypnea.  She received slight Medrol, duo nebulizer treatment in route to [facility] ED for further evaluation of the above.   ED Course:  Vital signs in the ED were notable for the following: Tetramex 98.0; heart rate initially noted to be 135, with interval trend down to 92; blood pressures ranged from 106/65 -131/75; initial respiratory rate 40, which is improved to 21-22 following transition from presenting CPAP to BiPAP with settings of 12/6 and 100% FiO2, upon which the patient has been maintaining oxygen saturations of 95 to 100%.   Labs were notable for the following: CMP was notable for the following: Sodium 136, potassium 3.8, creatinine 0.89, glucose 350.  CBC notable for white blood cell count of 40,000.  Initial lactic acid found to be 2.5, with interval trend down to 2.2 following interval IV fluids, as further described below.  BNP found to be 222, relative to only prior value of 30 in 2017.  Initial high-sensitivity troponin I noted to be 70, without any prior high-sensitivity troponin I values for point of comparison.  Screening nasopharyngeal COVID-19/influenza PCR was performed in the ED  today, and found to be negative.  Blood cultures x2 were collected prior to initiation of antibiotics.  EKG showed sinus tachycardia with heart rate 134, PVC x1, 2 inversion in aVL, less than 1 mm ST depression in lead III.  Chest x-ray showed new left lower lobe infiltrate consistent with pneumonia without evidence of edema, effusion, or pneumothorax.  While in the ED, the following were administered: Cefepime; normal saline x500 cc bolus followed by transition to lactated Ringer's at 150 cc/h.     Review of Systems: As per HPI otherwise 10 point review of systems negative.   Past Medical History:  Diagnosis Date  . Anemia, mild   . Arthritis    "qwhere" (02/10/2016)  . CAD (coronary artery disease)    a. VF arrest 01/2009/CAD with inferoposterior MI s/p aspiration thrombectomy/BMS of RCA at that time. b. ISR of BMS s/p DES to RCA 06/2010 with moderate LM/LAD disease not significant by FFR. c. s/p angiosculpt PTCA to prox RCA 01/2016 for ISR. d. Aggressive PTCA to prox RCA for ISR 05/2016.  . Cardiac arrest ([place]) 01/2009   a. in setting of inf-post STEMI 01/2009 (VF).  . Chest pain 06/04/2016  . Chronic bronchitis ([place])   . Chronic combined systolic and diastolic CHF (congestive heart failure) ([place])    a. EF 40-45% by cath 2010. b. 60-65% with grade 1 DD by echo 09/2015  . COPD (chronic obstructive pulmonary disease) ([place])   . Hyperlipidemia 01/28/2016  . Hyperlipidemia LDL goal <70 01/28/2016  . Hypertension   . Lung mass   . MI (myocardial infarction) ([place]) 01/2009  . Narcotic abuse ([place])    pt now taking Suboxone tid  . Pleural effusion on left 09/24/2015  . Pneumonia 06/2014; 09/2015  . Pre-diabetes   . Pulmonary nodule   . PVD (peripheral vascular disease) (HCC)    mild atherosclerosis of infrarenal aorta, 25% ostial left renal artery stenosis, 50% ostial right common iliac by cath 2010  . S/P PTCA (percutaneous transluminal coronary angioplasty) 02/10/16 to RCA lesion for in stent  restenois 02/11/2016  . Subclavian artery stenosis (HCC)    a. >50% by duplex 05/2016.  . Tobacco abuse   . Unstable angina [facility])     Past Surgical History:  Procedure Laterality Date  . ABDOMINAL HYSTERECTOMY    . ANKLE FRACTURE SURGERY Right 2000s  . CARDIAC CATHETERIZATION N/A 02/10/2016   Procedure: Left Heart Cath and Coronary Angiography;  Surgeon: Jettie Booze, MD;  Location: [place] CV LAB;  Service: Cardiovascular;  Laterality: N/A;  . CARDIAC CATHETERIZATION N/A 02/10/2016   Procedure: Coronary Balloon Angioplasty;  Surgeon: Jettie Booze, MD;  Location: [place] CV LAB;  Service: Cardiovascular;  Laterality: N/A;  instent RCA  . CARDIAC CATHETERIZATION N/A 06/05/2016   Procedure: Left Heart Cath and Coronary Angiography;  Surgeon: Leonie Man, MD;  Location: [place] CV LAB;  Service: Cardiovascular;  Laterality: N/A;  . CARDIAC CATHETERIZATION N/A 06/05/2016   Procedure: Coronary Balloon Angioplasty;  Surgeon: Leonie Man, MD;  Location: [place] CV LAB;  Service: Cardiovascular;  Laterality: N/A;  . CHEST TUBE INSERTION N/A 10/08/2015   Procedure: PLEURX CATH REMOVAL;  Surgeon: [name], MD;  Location: [facility] ORS;  Service: Thoracic;  Laterality: N/A;  . CORONARY ANGIOPLASTY WITH STENT PLACEMENT  01/2009;  2012  . FRACTURE SURGERY    . VIDEO ASSISTED THORACOSCOPY (VATS)/THOROCOTOMY Left 09/29/2015   Procedure: PREOP BRONCHOSCOPY, LEFT THORACOSCOPY, POSSIBLE THORACOTOMY, PLEURAL BIOPSY, TALC;  Surgeon: [name], MD;  Location: [facility] ORS;  Service: General;  Laterality: Left;    Social History:  reports that she has been smoking cigarettes. She has a 28.00 pack-year smoking history. She has never used smokeless tobacco. She reports that she does not drink alcohol and does not use drugs.   Allergies  Allergen Reactions  . Azithromycin Rash    Family History  Problem Relation Age of Onset  . Coronary artery disease Father   . Hyperlipidemia  Father   . Early death Father   . Heart attack Father   . Coronary artery disease Mother   . Hyperlipidemia Mother   . Heart attack Mother   . Cancer Brother     Prior to Admission medications   Medication Sig Start Date End Date Taking? Authorizing Provider  acetaminophen (TYLENOL) 500 MG tablet Take 1,000 mg by mouth every 6 (six) hours as needed for headache.   Yes [provider]  albuterol (PROVENTIL HFA;VENTOLIN HFA) 108 (90 Base) MCG/ACT inhaler Inhale 2 puffs into the lungs every 4 (four) hours as needed for wheezing or shortness of breath.   Yes [provider]  amoxicillin (AMOXIL) 500 MG tablet Take 500 mg by mouth 3 (three) times daily. 08/03/20  Yes [provider]  aspirin EC 81 MG tablet Take 81 mg by mouth at bedtime.    Yes [provider]  azelastine (ASTELIN) 0.1 % nasal spray USE 2 SPRAYS BID UNTIL DIRECTED TO STOP. USE FOR RUNNY NOSE 08/14/17  Yes [provider]  budesonide-formoterol (SYMBICORT) 160-4.5 MCG/ACT inhaler Inhale 1 puff into the lungs 2 (two) times daily. 03/05/18  Yes Pleas Koch, NP  Buprenorphine HCl-Naloxone HCl 8-2 MG FILM Place 1 Film under the tongue 3 (three) times daily.   Yes [provider]  cetirizine (ZYRTEC) 10 MG tablet Take 1 tablet (10 mg total) by mouth daily. 03/11/18  Yes Alene Mires, Sahar M, PA-C  clopidogrel (PLAVIX) 75 MG tablet TAKE 1 TABLET(75 MG) BY MOUTH AT BEDTIME 12/23/19  Yes Jettie Booze, MD  dexamethasone (DECADRON) 6 MG tablet dexamethasone 6 mg tablet  TAKE 1 TABLET BY MOUTH EVERY DAY   Yes [provider]  doxycycline (VIBRAMYCIN) 100 MG capsule doxycycline hyclate 100 mg capsule  TAKE 1 CAPSULE BY MOUTH TWICE DAILY   Yes [provider]  fluticasone (FLONASE) 50 MCG/ACT nasal spray Place 1 spray into both nostrils daily.   Yes [provider]  furosemide (LASIX) 40 MG tablet Take 1 tablet (40 mg total) by mouth daily as needed  (swelling). 01/16/20  Yes Jettie Booze, MD  ibuprofen (ADVIL,MOTRIN) 600 MG tablet Take 600 mg by mouth as needed for pain. 08/14/17  Yes [provider]  isosorbide mononitrate (IMDUR) 30 MG 24 hr tablet Take 1 tablet (30 mg total) by mouth daily. 10/02/19  Yes Jettie Booze, MD  levothyroxine (SYNTHROID) 88 MCG tablet Take 88 mcg by mouth every morning. 05/26/20  Yes [provider]  metoprolol tartrate (LOPRESSOR) 25 MG tablet Take 50 mg by mouth 2 (two) times daily.   Yes [provider]  mupirocin ointment (BACTROBAN) 2 % mupirocin 2 % topical ointment  APPLY TOPICALLY TO LOWER LEG THREE TIMES DAILY FOR 10 DAYS   Yes [provider]  nystatin cream (MYCOSTATIN) Apply topically 2 (  two) times daily. 08/11/20  Yes [provider]  potassium chloride SA (KLOR-CON) 20 MEQ tablet Take 1 tablet (20 mEq total) by mouth daily as needed (Take with furosemide (lasix)). 01/16/20  Yes Corky Crafts, MD  predniSONE (DELTASONE) 20 MG tablet prednisone 20 mg tablet   Yes [provider]  rosuvastatin (CRESTOR) 20 MG tablet TAKE 1 TABLET(20 MG) BY MOUTH DAILY 12/23/19  Yes Corky Crafts, MD  TRELEGY ELLIPTA 100-62.5-25 MCG/INH AEPB Take 1 puff by mouth daily. 08/13/20  Yes [provider]  triamcinolone cream (KENALOG) 0.1 % Apply topically 2 (two) times daily. to affected area 08/11/20  Yes [provider]  nitroGLYCERIN (NITROSTAT) 0.4 MG SL tablet Place 1 tablet (0.4 mg total) under the tongue every 5 (five) minutes as needed for chest pain. 03/12/18   Corky Crafts, MD     Objective    Physical Exam: Vitals:   09/10/20 1430 09/10/20 1435 09/10/20 1445 09/10/20 1500  BP:  131/75  127/72  Pulse: (!) 116 (!) 115 (!) 110 (!) 108  Resp: (!) 26 19 (!) 22 (!) 22  Temp:      TempSrc:      SpO2: 98% 98% 98% 99%  Weight:      Height:        General: appears to be stated age; alert, oriented; increased work  of breathing noted.  Skin: warm, dry, no rash Head:  AT/[place] Mouth:  Oral mucosa membranes appear moist, normal dentition Neck: supple; trachea midline Heart:  RRR; did not appreciate any M/R/G Lungs: Left basilar rales noted; otherwise, CTAB, did not appreciate any wheezes, or rhonchi Abdomen: + BS; soft, ND, NT Vascular: 2+ pedal pulses b/l; 2+ radial pulses b/l Extremities: no peripheral edema, no muscle wasting Neuro: strength and sensation intact in upper and lower extremities b/l   Labs on Admission: I have personally reviewed following labs and imaging studies  CBC: Recent Labs  Lab 09/10/20 1417  WBC 40.2*  HGB 13.5  HCT 42.8  MCV 96.2  PLT 429*   Basic Metabolic Panel: Recent Labs  Lab 09/10/20 1417  NA 136  K 3.8  CL 106  CO2 15*  GLUCOSE 350*  BUN 22  CREATININE 0.89  CALCIUM 8.8*   GFR: Estimated Creatinine Clearance: 45.9 mL/min (by C-G formula based on SCr of 0.89 mg/dL). Liver Function Tests: Recent Labs  Lab 09/10/20 1417  AST 37  ALT 42  ALKPHOS 93  BILITOT 0.7  PROT 7.0  ALBUMIN 3.7   No results for input(s): LIPASE, AMYLASE in the last 168 hours. No results for input(s): AMMONIA in the last 168 hours. Coagulation Profile: No results for input(s): INR, PROTIME in the last 168 hours. Cardiac Enzymes: No results for input(s): CKTOTAL, CKMB, CKMBINDEX, TROPONINI in the last 168 hours. BNP (last 3 results) No results for input(s): PROBNP in the last 8760 hours. HbA1C: No results for input(s): HGBA1C in the last 72 hours. CBG: No results for input(s): GLUCAP in the last 168 hours. Lipid Profile: No results for input(s): CHOL, HDL, LDLCALC, TRIG, CHOLHDL, LDLDIRECT in the last 72 hours. Thyroid Function Tests: No results for input(s): TSH, T4TOTAL, FREET4, T3FREE, THYROIDAB in the last 72 hours. Anemia Panel: No results for input(s): VITAMINB12, FOLATE, FERRITIN, TIBC, IRON, RETICCTPCT in the last 72 hours. Urine analysis:    Component  Value Date/Time   COLORURINE YELLOW (A) 09/28/2015 1600   APPEARANCEUR CLEAR (A) 09/28/2015 1600   APPEARANCEUR Clear 03/19/2013 1617  LABSPEC 1.011 09/28/2015 1600   LABSPEC 1.011 03/19/2013 1617   PHURINE 6.0 09/28/2015 1600   GLUCOSEU NEGATIVE 09/28/2015 1600   GLUCOSEU Negative 03/19/2013 1617   HGBUR 1+ (A) 09/28/2015 1600   BILIRUBINUR NEGATIVE 09/28/2015 1600   BILIRUBINUR Negative 03/19/2013 [place] 09/28/2015 1600   PROTEINUR NEGATIVE 09/28/2015 1600   NITRITE NEGATIVE 09/28/2015 1600   LEUKOCYTESUR NEGATIVE 09/28/2015 1600   LEUKOCYTESUR Negative 03/19/2013 1617    Radiological Exams on Admission: DG Chest Portable 1 View  Result Date: 09/10/2020 CLINICAL DATA:  Respiratory distress. EXAM: PORTABLE CHEST 1 VIEW COMPARISON:  June 04, 2016. FINDINGS: The heart size and mediastinal contours are within normal limits. No pneumothorax or pleural effusion is noted. New large left lower lobe airspace opacity is noted concerning for pneumonia. Minimal right basilar atelectasis or infiltrate is noted. The visualized skeletal structures are unremarkable. IMPRESSION: New large left lower lobe airspace opacity is noted concerning for pneumonia. Aortic Atherosclerosis (ICD10-I70.0). Electronically Signed   By: Marijo Conception M.D.   On: 09/10/2020 14:51     Assessment/Plan   Kathryn Garrett is a 75 y.o. female with medical history significant for coronary artery disease status post RCA stent with ensuing recurrent in-stent restenosis of RCA stent, most recently in 6010, chronic diastolic heart failure, COPD, hypertension, hyperlipidemia, who is admitted to [facility] on 09/10/2020 with acute COPD exacerbation after presenting from home to [facility] ED complaining of shortness of breath.     Principal Problem:   Acute exacerbation of chronic obstructive pulmonary disease (COPD) ([place]) Active Problems:   CAP (community acquired pneumonia)   HTN  (hypertension)   Tobacco abuse   Hyperlipidemia LDL goal <70   Severe sepsis (HCC)   NSTEMI (non-ST elevated myocardial infarction) (HCC)   Hyperglycemia   SOB (shortness of breath)    #) Acute COPD exacerbation: in the context of a documented history of COPD with extensive smoking history, patient presents with 2 to 3 days of progressive shortness of breath increased work of breathing.  The patient Caryn Section is a severe sepsis due to left lower lobe pneumonia, as further described below.  Unfortunately there is no documentation from EMS or in the ED of initial oxygen saturation prior to initiation of BiPAP, upon which the patient meets at this time, with settings of 12/6 on 100% FiO2, therefore unable to quantify any underlying acute hypoxia at this time.  She denies any baseline supplemental oxygen requirements, and confirms that she continues to smoke.  Of note, nasopharyngeal COVID-19/influenza PCR performed today were found to be negative.  Plan: Stat VBG to assess ventilation on current BiPAP settings.  Cymetra 40 mg IV every 6 hours.  Scheduled duo nebulizer treatments.  As needed albuterol nebulizer.  Add on serum phosphorus and magnesium levels.  Antibiotic coverage for suspected precipitating left lower lobe pneumonia, as further described below.  Monitor on continuous pulse oximetry.  Counseled the patient on the importance of complete smoking discontinuation. Will attempt additional chart review to evaluate any prior pulmonary function test results.  Monitor on telemetry.  Repeat VBG in the morning.      #) Severe sepsis due to left lower lobe pneumonia: Diagnosis on the basis of 2 to 3 days of presenting shortness of breath with new onset nonproductive cough, subjective fever, with chest x-ray showing interval development of left lower lobe infiltrate consistent with pneumonia in the absence of evidence of edema, effusion, or pneumothorax.  SIRS criteria met  via presenting leukocytosis  and tachypnea.  Patient sepsis meets criteria to be considered severe nature on the basis of concomitant evidence of endorgan damage in the form of elevated initial lactic acid of 2.5, which is subsequently trended down to 2.2.  No evidence of hypotension.  Consequently, there is no indication at this time for administration of a 30 mL/kg IVF bolus.  Blood cultures x2 collected in the ED prior to antibiotics.  In the setting of presenting severe sepsis, will initiate broad-spectrum IV antibiotics, including IV vancomycin and cefepime.  Additionally, will start doxycycline for atypical coverage given the patient's reported allergy to azithromycin.  No evidence of additional underlying infectious source at this time, including negative COVID-19 and influenza PCR today.  We will also check urinalysis.   Plan: Monitor for results blood cultures x2.  IV vancomycin, cefepime, doxycycline, as above.  MRSA PCR, which, if negative, can likely discontinue IV vancomycin due to the high negative predictive value associated with this result in the setting of pneumonia.  Reduce current IV fluids to 75 cc/h, with repeat lactate ordered in 3 hours from now.  Repeat CBC with differential in the morning.  Add on procalcitonin.  Recurrent management of presenting / suspected consequential acute COPD exacerbation, as above.  Monitor continuous pulse oximetry.  Check strep pneumoniae urine antigen.  Check urinalysis.       #) NSTEMI: Elevated initial troponin of 70, has increased to 745 EKG notable for less than 1 mm ST depression in lead III, aVF, in the absence of any recent chest pain, with chest x-ray showing left lower lobe infiltrate suggestive of pneumonia in the absence of any pneumothorax.  Suspect that his elevated troponin is on the basis of supply demand mismatch causing type II NSTEMI in the setting of presenting severe sepsis and ensuing acute COPD exacerbation.  However, given the patient's history of coronary  artery disease with recurrent in-stent restenosis, I have initiated medical management, including heparin drip, as further detailed below, and have consulted on-call cardiology, Dr. Oval Linsey, who confirms that cardiology will evaluation the patient. No current or recent chest pain.    Plan: Heparin drip, resume home Lopressor, resume home high intensity rosuvastatin.  Start low-dose lisinopril.  Echocardiogram has been ordered for the morning.  Monitor on telemetry.  Monitor continuous pulse oximetry.  Continue to trend high-sensitivity troponin I values.  Cardiology formally consulted, as above.     #) Hyperglycemia: In the context of no documented history of underlying diabetes, presenting blood sugar per CMP noted to be 350.  Potential contributing factors include high-dose Solu-Medrol administered via EMS in route to the ED today as well as elevation and as a consequence of physiologic stress in the setting of presenting severe sepsis, acute COPD exacerbation, and NSTEMI, as further detailed above.  No evidence of anion gap metabolic acidosis.   Plan: Add on hemoglobin A1c level.  Accu-Cheks before every meal and at bedtime with very low-dose sliding scale insulin, particularly given plan for additional systemic corticosteroids as management of presenting acute COPD exacerbation.       #) Essential hypertension: Outpatient hypertensive regimen includes Lopressor, Imdur, but no ACE inhibitor or ARB.  Blood pressures have remained normotensive in the ED.   Plan: Resume home Lopressor and Imdur.  In the setting of presenting NSTEMI, will have also started low-dose lisinopril, as above.  Close monitoring of ensuing blood pressure via routine vital signs.      #) Hyperlipidemia: On high intensity rosuvastatin  as an outpatient.  Plan: Resume home rosuvastatin, with first dose to occur now.       #) Chronic diastolic heart failure: Most recent echocardiogram in May 2017 was notable for  grade 1 diastolic dysfunction, as well as additional results as further detailed above.  No clinical or radiographic evidence to suggest acutely decompensated heart failure at this time.  The patient conveys that she is on as needed Lasix as an outpatient, in the absence of any scheduled diuretic medications.   Plan: Monitor strict I's and O's and daily weights.  Add on serum magnesium level.  Repeat BMP in the morning.        #) Acquired hypothyroidism: On Synthroid as an outpatient.  Plan: Continue home Synthroid.      #) Chronic tobacco abuse: The patient acknowledges a long smoking history, and reports that she continues to smoke cigarettes.  Plan: Counseled the patient on the importance of complete smoking discontinuation, particularly in the setting of her history of coronary artery disease as well as COPD.     DVT prophylaxis: Heparin drip, as above Code Status: Full code Family Communication: none Disposition Plan: Per Rounding Team Consults called: Dr. Oval Linsey of [facility] cardiology consulted, as further detailed above. Admission status: Inpatient; stepdown unit (on bipap)     Of note, this patient was added by me to the following Admit List/Treatment Team: armcadmits.      PLEASE NOTE THAT DRAGON DICTATION SOFTWARE WAS USED IN THE CONSTRUCTION OF THIS NOTE.   [place] [facility] Pager [phone] From 12PM - 12AM  Otherwise, please contact night-coverage  [email or web address] Password [facility]   09/10/2020, 4:14 PM

## 2020-09-11 ENCOUNTER — Inpatient Hospital Stay: Payer: Medicare HMO

## 2020-09-11 ENCOUNTER — Inpatient Hospital Stay (HOSPITAL_COMMUNITY)
Admit: 2020-09-11 | Discharge: 2020-09-11 | Disposition: A | Discharge status: Home / Self Care | Payer: Medicare HMO | Attending: Internal Medicine | Admitting: Internal Medicine

## 2020-09-11 ENCOUNTER — Encounter: Payer: Self-pay | Admitting: Pulmonary Disease

## 2020-09-11 DIAGNOSIS — J441 Chronic obstructive pulmonary disease with (acute) exacerbation: Secondary | ICD-10-CM | POA: Diagnosis not present

## 2020-09-11 DIAGNOSIS — J9600 Acute respiratory failure, unspecified whether with hypoxia or hypercapnia: Secondary | ICD-10-CM | POA: Diagnosis present

## 2020-09-11 DIAGNOSIS — I214 Non-ST elevation (NSTEMI) myocardial infarction: Secondary | ICD-10-CM

## 2020-09-11 DIAGNOSIS — Z720 Tobacco use: Secondary | ICD-10-CM | POA: Diagnosis not present

## 2020-09-11 DIAGNOSIS — I5020 Unspecified systolic (congestive) heart failure: Secondary | ICD-10-CM

## 2020-09-11 LAB — COMPREHENSIVE METABOLIC PANEL
ALT: 58 U/L — ABNORMAL HIGH (ref 0–44)
AST: 42 U/L — ABNORMAL HIGH (ref 15–41)
Albumin: 3.4 g/dL — ABNORMAL LOW (ref 3.5–5.0)
Alkaline Phosphatase: 97 U/L (ref 38–126)
Anion gap: 15 (ref 5–15)
BUN: 26 mg/dL — ABNORMAL HIGH (ref 8–23)
CO2: 19 mmol/L — ABNORMAL LOW (ref 22–32)
Calcium: 8.2 mg/dL — ABNORMAL LOW (ref 8.9–10.3)
Chloride: 108 mmol/L (ref 98–111)
Creatinine, Ser: 0.87 mg/dL (ref 0.44–1.00)
GFR, Estimated: >60 mL/min (ref >60)
Glucose, Bld: 182 mg/dL — ABNORMAL HIGH (ref 70–99)
Potassium: 3.3 mmol/L — ABNORMAL LOW (ref 3.5–5.1)
Sodium: 142 mmol/L (ref 135–145)
Total Bilirubin: 0.8 mg/dL (ref 0.3–1.2)
Total Protein: 6.9 g/dL (ref 6.5–8.1)

## 2020-09-11 LAB — BLOOD GAS, VENOUS
Acid-base deficit: 5.8 mmol/L — ABNORMAL HIGH (ref 0.0–2.0)
Bicarbonate: 20.7 mmol/L (ref 20.0–28.0)
O2 Saturation: 98.3 %
Patient temperature: 37
pCO2, Ven: 43 mmHg — ABNORMAL LOW (ref 44.0–60.0)
pH, Ven: 7.29 (ref 7.250–7.430)
pO2, Ven: 121 mmHg — ABNORMAL HIGH (ref 32.0–45.0)

## 2020-09-11 LAB — ECHOCARDIOGRAM COMPLETE
AR max vel: 1.65 cm2
AV Peak grad: 4.1 mmHg
Ao pk vel: 1.01 m/s
Area-P 1/2: 5.97 cm2
Height: 59 in
S' Lateral: 3.77 cm
Weight: 2243.4 oz

## 2020-09-11 LAB — URINE DRUG SCREEN, QUALITATIVE (ARMC ONLY)
Amphetamines, Ur Screen: NOT DETECTED
Barbiturates, Ur Screen: NOT DETECTED
Benzodiazepine, Ur Scrn: NOT DETECTED
Cannabinoid 50 Ng, Ur ~~LOC~~: NOT DETECTED
Cocaine Metabolite,Ur ~~LOC~~: NOT DETECTED
MDMA (Ecstasy)Ur Screen: NOT DETECTED
Methadone Scn, Ur: NOT DETECTED
Opiate, Ur Screen: NOT DETECTED
Phencyclidine (PCP) Ur S: NOT DETECTED
Tricyclic, Ur Screen: NOT DETECTED

## 2020-09-11 LAB — HEPARIN LEVEL (UNFRACTIONATED)
Heparin Unfractionated: 0.5 IU/mL (ref 0.30–0.70)
Heparin Unfractionated: 0.24 IU/mL — ABNORMAL LOW (ref 0.30–0.70)
Heparin Unfractionated: 0.55 IU/mL (ref 0.30–0.70)

## 2020-09-11 LAB — URINALYSIS, COMPLETE (UACMP) WITH MICROSCOPIC
Bilirubin Urine: NEGATIVE
Glucose, UA: 50 mg/dL — AB
Ketones, ur: 5 mg/dL — AB
Leukocytes,Ua: NEGATIVE
Nitrite: POSITIVE — AB
Protein, ur: 100 mg/dL — AB
Specific Gravity, Urine: 1.023 (ref 1.005–1.030)
pH: 5 (ref 5.0–8.0)

## 2020-09-11 LAB — BLOOD GAS, ARTERIAL
Acid-base deficit: 3.7 mmol/L — ABNORMAL HIGH (ref 0.0–2.0)
Bicarbonate: 20.7 mmol/L (ref 20.0–28.0)
FIO2: 50
MECHVT: 450 mL
Mechanical Rate: 18
O2 Saturation: 96.3 %
PEEP: 8 cmH2O
Patient temperature: 37
RATE: 18 resp/min
pCO2 arterial: 35 mmHg (ref 32.0–48.0)
pH, Arterial: 7.38 (ref 7.350–7.450)
pO2, Arterial: 86 mmHg (ref 83.0–108.0)

## 2020-09-11 LAB — CBC WITH DIFFERENTIAL/PLATELET
Abs Immature Granulocytes: 0.26 10*3/uL — ABNORMAL HIGH (ref 0.00–0.07)
Basophils Absolute: 0.1 10*3/uL (ref 0.0–0.1)
Basophils Relative: 0 %
Eosinophils Absolute: 0 10*3/uL (ref 0.0–0.5)
Eosinophils Relative: 0 %
HCT: 44.8 % (ref 36.0–46.0)
Hemoglobin: 14.2 g/dL (ref 12.0–15.0)
Immature Granulocytes: 1 %
Lymphocytes Relative: 14 %
Lymphs Abs: 4.2 10*3/uL — ABNORMAL HIGH (ref 0.7–4.0)
MCH: 30.7 pg (ref 26.0–34.0)
MCHC: 31.7 g/dL (ref 30.0–36.0)
MCV: 97 fL (ref 80.0–100.0)
Monocytes Absolute: 1.8 10*3/uL — ABNORMAL HIGH (ref 0.1–1.0)
Monocytes Relative: 6 %
Neutro Abs: 24.5 10*3/uL — ABNORMAL HIGH (ref 1.7–7.7)
Neutrophils Relative %: 79 %
Platelets: 324 10*3/uL (ref 150–400)
RBC: 4.62 MIL/uL (ref 3.87–5.11)
RDW: 14.6 % (ref 11.5–15.5)
Smear Review: NORMAL
WBC: 30.7 10*3/uL — ABNORMAL HIGH (ref 4.0–10.5)
nRBC: 0 % (ref 0.0–0.2)

## 2020-09-11 LAB — TROPONIN I (HIGH SENSITIVITY)
Troponin I (High Sensitivity): 2434 ng/L (ref <18)
Troponin I (High Sensitivity): 2265 ng/L (ref <18)
Troponin I (High Sensitivity): 2455 ng/L (ref <18)
Troponin I (High Sensitivity): 2317 ng/L (ref <18)

## 2020-09-11 LAB — MRSA PCR SCREENING: MRSA by PCR: POSITIVE — AB

## 2020-09-11 LAB — HEMOGLOBIN A1C
Hgb A1c MFr Bld: 6.4 % — ABNORMAL HIGH (ref 4.8–5.6)
Mean Plasma Glucose: 136.98 mg/dL

## 2020-09-11 LAB — BRAIN NATRIURETIC PEPTIDE
B Natriuretic Peptide: 1009.8 pg/mL — ABNORMAL HIGH (ref 0.0–100.0)
B Natriuretic Peptide: 1298.4 pg/mL — ABNORMAL HIGH (ref 0.0–100.0)

## 2020-09-11 LAB — D-DIMER, QUANTITATIVE: D-Dimer, Quant: 3.47 ug/mL-FEU — ABNORMAL HIGH (ref 0.00–0.50)

## 2020-09-11 LAB — MAGNESIUM: Magnesium: 2.1 mg/dL (ref 1.7–2.4)

## 2020-09-11 LAB — LACTIC ACID, PLASMA
Lactic Acid, Venous: 5.5 mmol/L (ref 0.5–1.9)
Lactic Acid, Venous: 2.5 mmol/L (ref 0.5–1.9)
Lactic Acid, Venous: 1.9 mmol/L (ref 0.5–1.9)

## 2020-09-11 LAB — CKMB (ARMC ONLY): CK, MB: 26.6 ng/mL — ABNORMAL HIGH (ref 0.5–5.0)

## 2020-09-11 LAB — GLUCOSE, CAPILLARY: Glucose-Capillary: 145 mg/dL — ABNORMAL HIGH (ref 70–99)

## 2020-09-11 LAB — PHOSPHORUS: Phosphorus: 5.1 mg/dL — ABNORMAL HIGH (ref 2.5–4.6)

## 2020-09-11 LAB — AST: AST: 23 U/L (ref 15–41)

## 2020-09-11 LAB — STREP PNEUMONIAE URINARY ANTIGEN: Strep Pneumo Urinary Antigen: NEGATIVE

## 2020-09-11 IMAGING — DX DG CHEST 1V PORT
1 series · 1 of 1 positions shown · non-contrast
Comparison: [DATE]

CLINICAL DATA: Endotracheal tube and orogastric tube placement.

EXAM:
PORTABLE CHEST 1 VIEW

[chest ap]
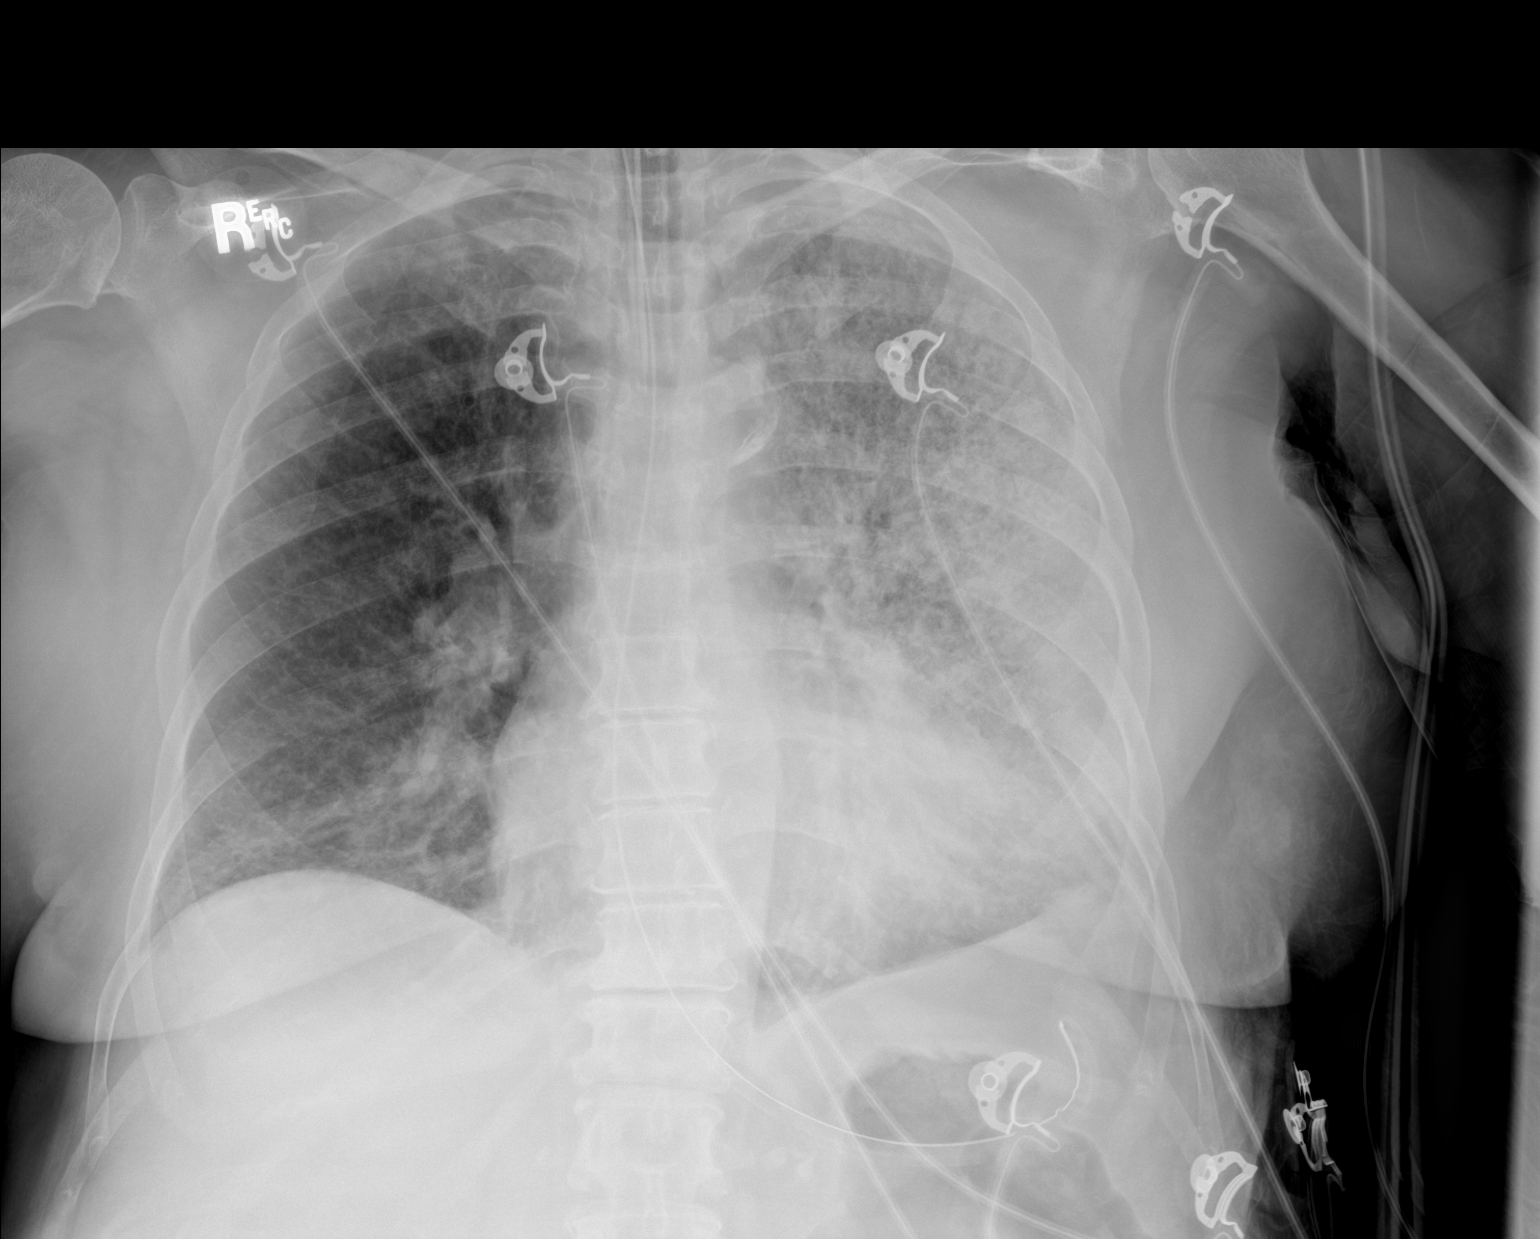

[1 of 1 positions shown; findings below may reference images not displayed]

FINDINGS: An endotracheal tube is seen with its distal tip approximately
cm from the carina. A nasogastric tube is noted with its distal tip
overlying the expected region of the gastric fundus. Marked severity
infiltrate is seen throughout the left lung. This is mildly
increased in severity when compared to the prior study. Mild, stable
right basilar atelectasis and/or infiltrate is seen. There is no
evidence of a pleural effusion or pneumothorax. The heart size and
mediastinal contours are within normal limits. There is moderate
severity calcification of the aortic arch. The visualized skeletal
structures are unremarkable.
IMPRESSION: Interval endotracheal tube and nasogastric tube placement
positioning, as described above.

## 2020-09-11 IMAGING — CT CT HEAD W/O CM
3 series · 15 of 46 positions shown, 18 images · non-contrast
Comparison: None.

CLINICAL DATA: 74-year-old female with unexplained altered mental
status. Intubated and unresponsive.

EXAM:
CT HEAD WITHOUT CONTRAST
TECHNIQUE: Contiguous axial images were obtained from the base of the skull
through the vertex without intravenous contrast.

[Series 3: head wo · axial · 0.42mm/px · z∈[-165,-45]mm · 9 of 29 slices shown, 12 images]
[im 3/29  brain]
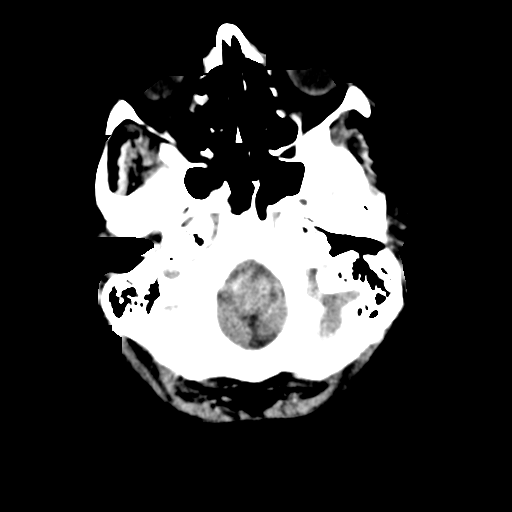
[im 3/29  bone]
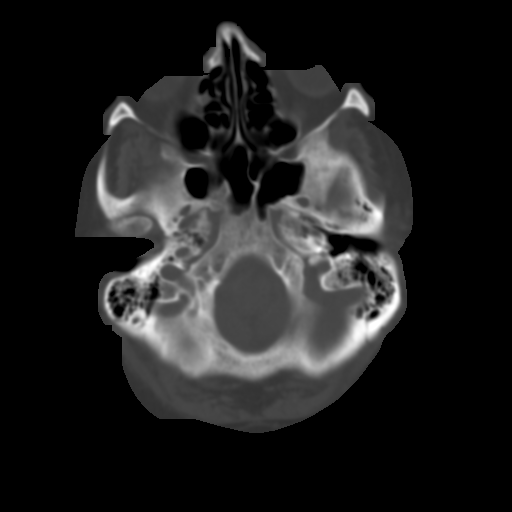
[im 6/29  brain]
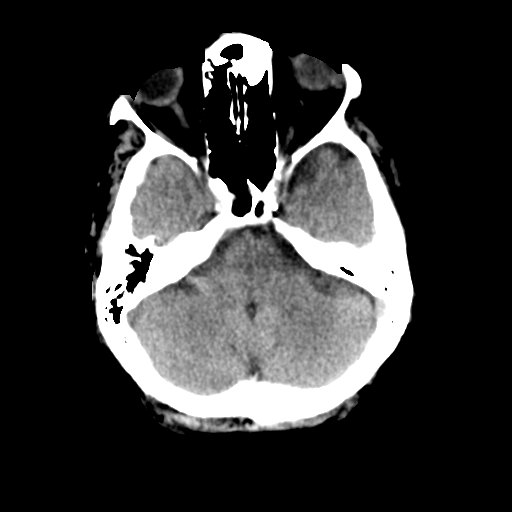
[im 9/29  brain]
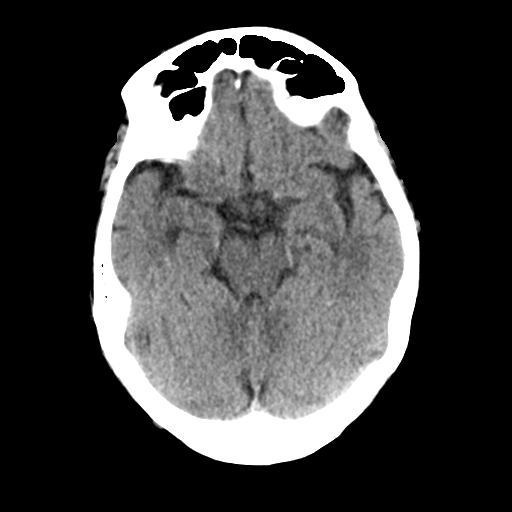
[im 12/29  brain]
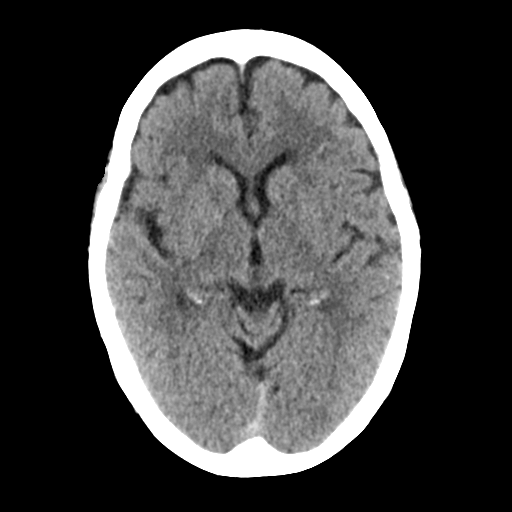
[im 15/29  brain]
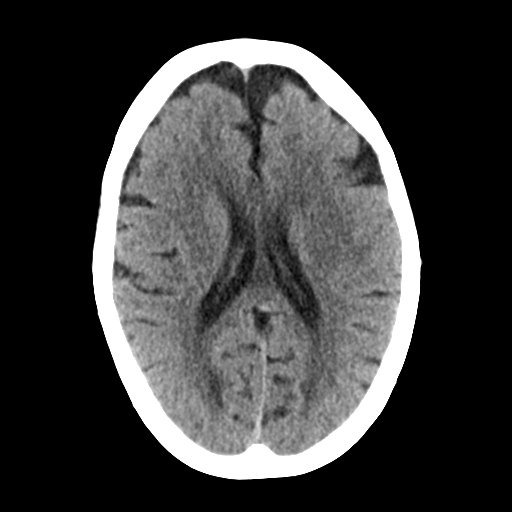
[im 15/29  bone]
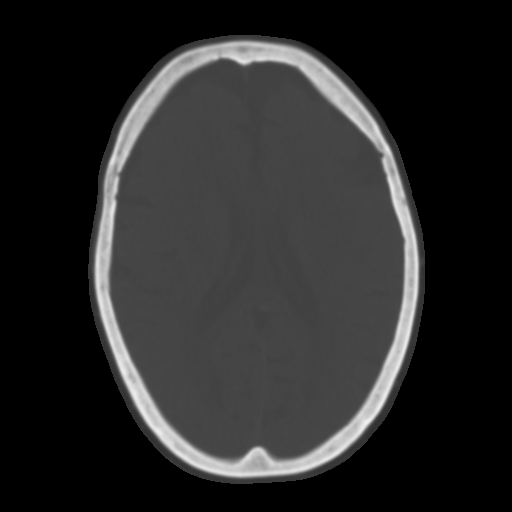
[im 18/29  brain]
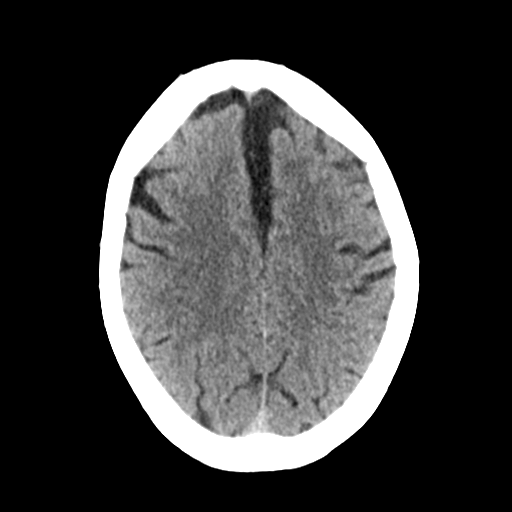
[im 21/29  brain]
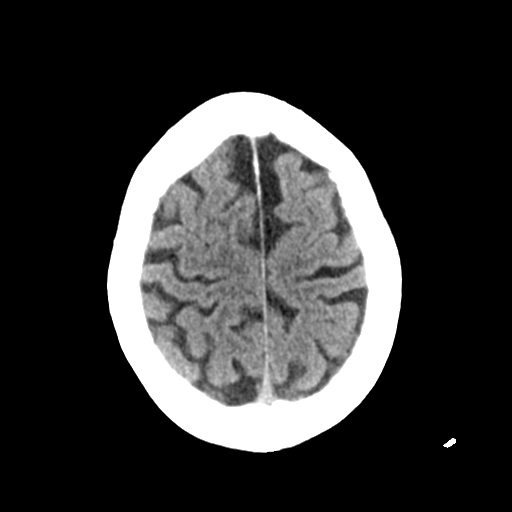
[im 24/29  brain]
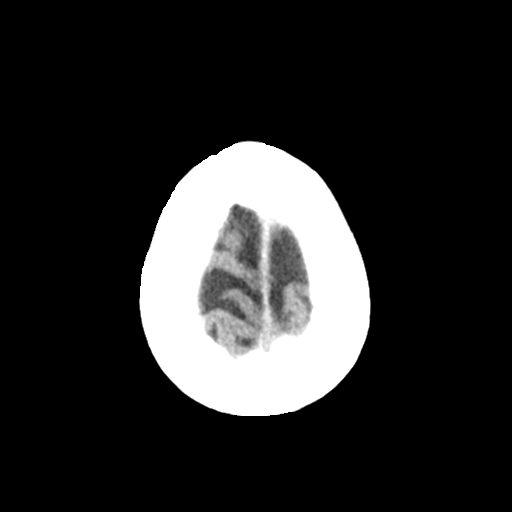
[im 27/29  brain]
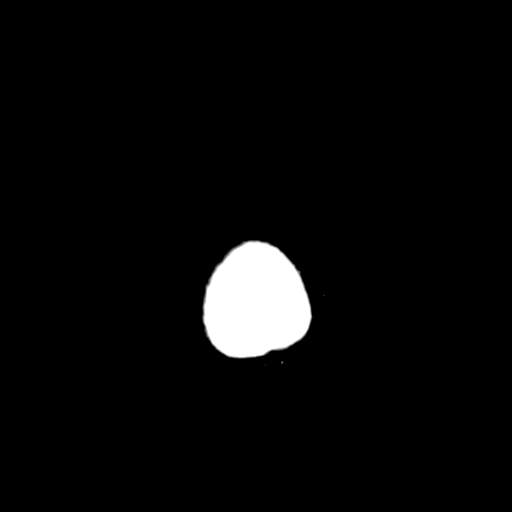
[im 27/29  bone]
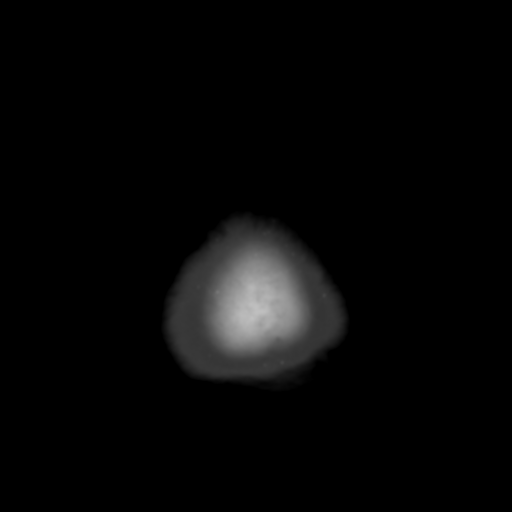

[Series 4: coronal soft tissue · coronal · 0.27mm/px · 3 of 66 slices shown]
[im 22/66  brain]
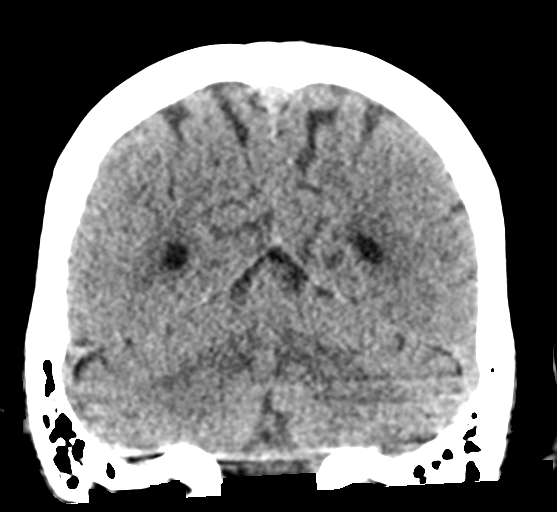
[im 29/66  brain]
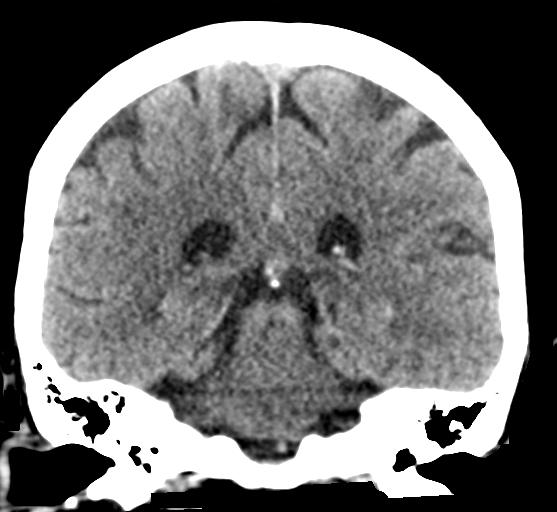
[im 37/66  brain]
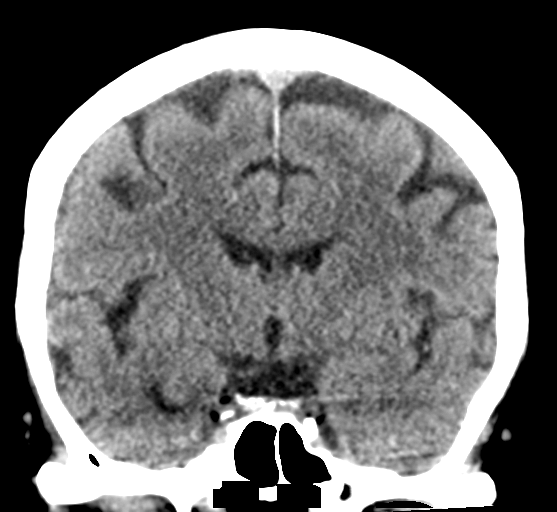

[Series 5: sagittal soft tissue · sagittal · 0.27mm/px · 3 of 51 slices shown]
[im 17/51  brain]
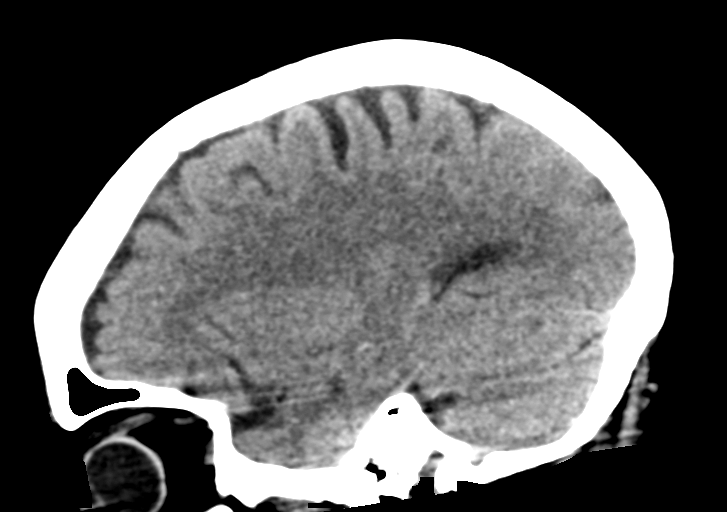
[im 26/51  brain]
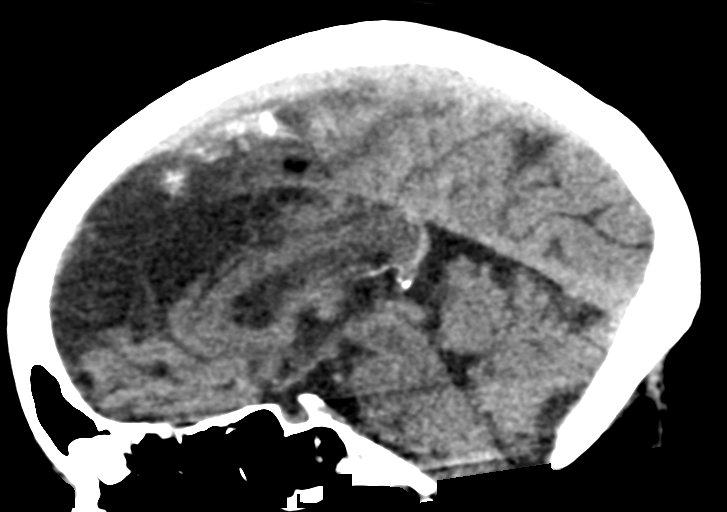
[im 34/51  brain]
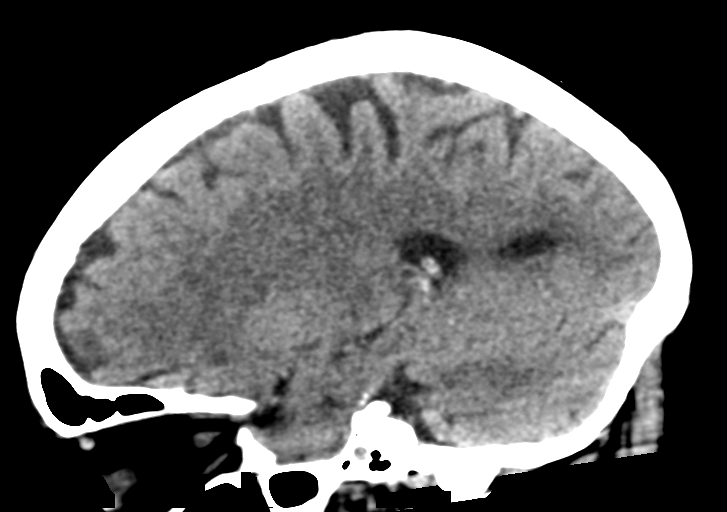

[15 of 46 positions shown; findings below may reference images not displayed]

FINDINGS: Brain: No midline shift, ventriculomegaly, mass effect, evidence of
mass lesion, intracranial hemorrhage or evidence of cortically based
acute infarction. Gray-white matter differentiation appears
symmetric and within normal limits for age. No cortical
encephalomalacia identified. Mild dystrophic calcification in the
basal ganglia.

Vascular: Calcified atherosclerosis at the skull base. No suspicious
intracranial vascular hyperdensity.

Skull: Negative.

Sinuses/Orbits: Frontoethmoidal sinus osteomas appear
inconsequential. Visualized paranasal sinuses and mastoids are
clear.

Other: No acute orbit or scalp soft tissue finding. Chronic
postoperative changes to the globes.
IMPRESSION: Negative for age noncontrast CT appearance of the brain. No acute
intracranial abnormality identified.

## 2020-09-11 IMAGING — DX DG CHEST 1V PORT
1 series · 1 of 1 positions shown · non-contrast
Comparison: Portable chest [1J] hours today and earlier.

CLINICAL DATA: 74-year-old female with unexplained altered mental
status. Intubated and unresponsive.

EXAM:
PORTABLE CHEST 1 VIEW

[chest ap]
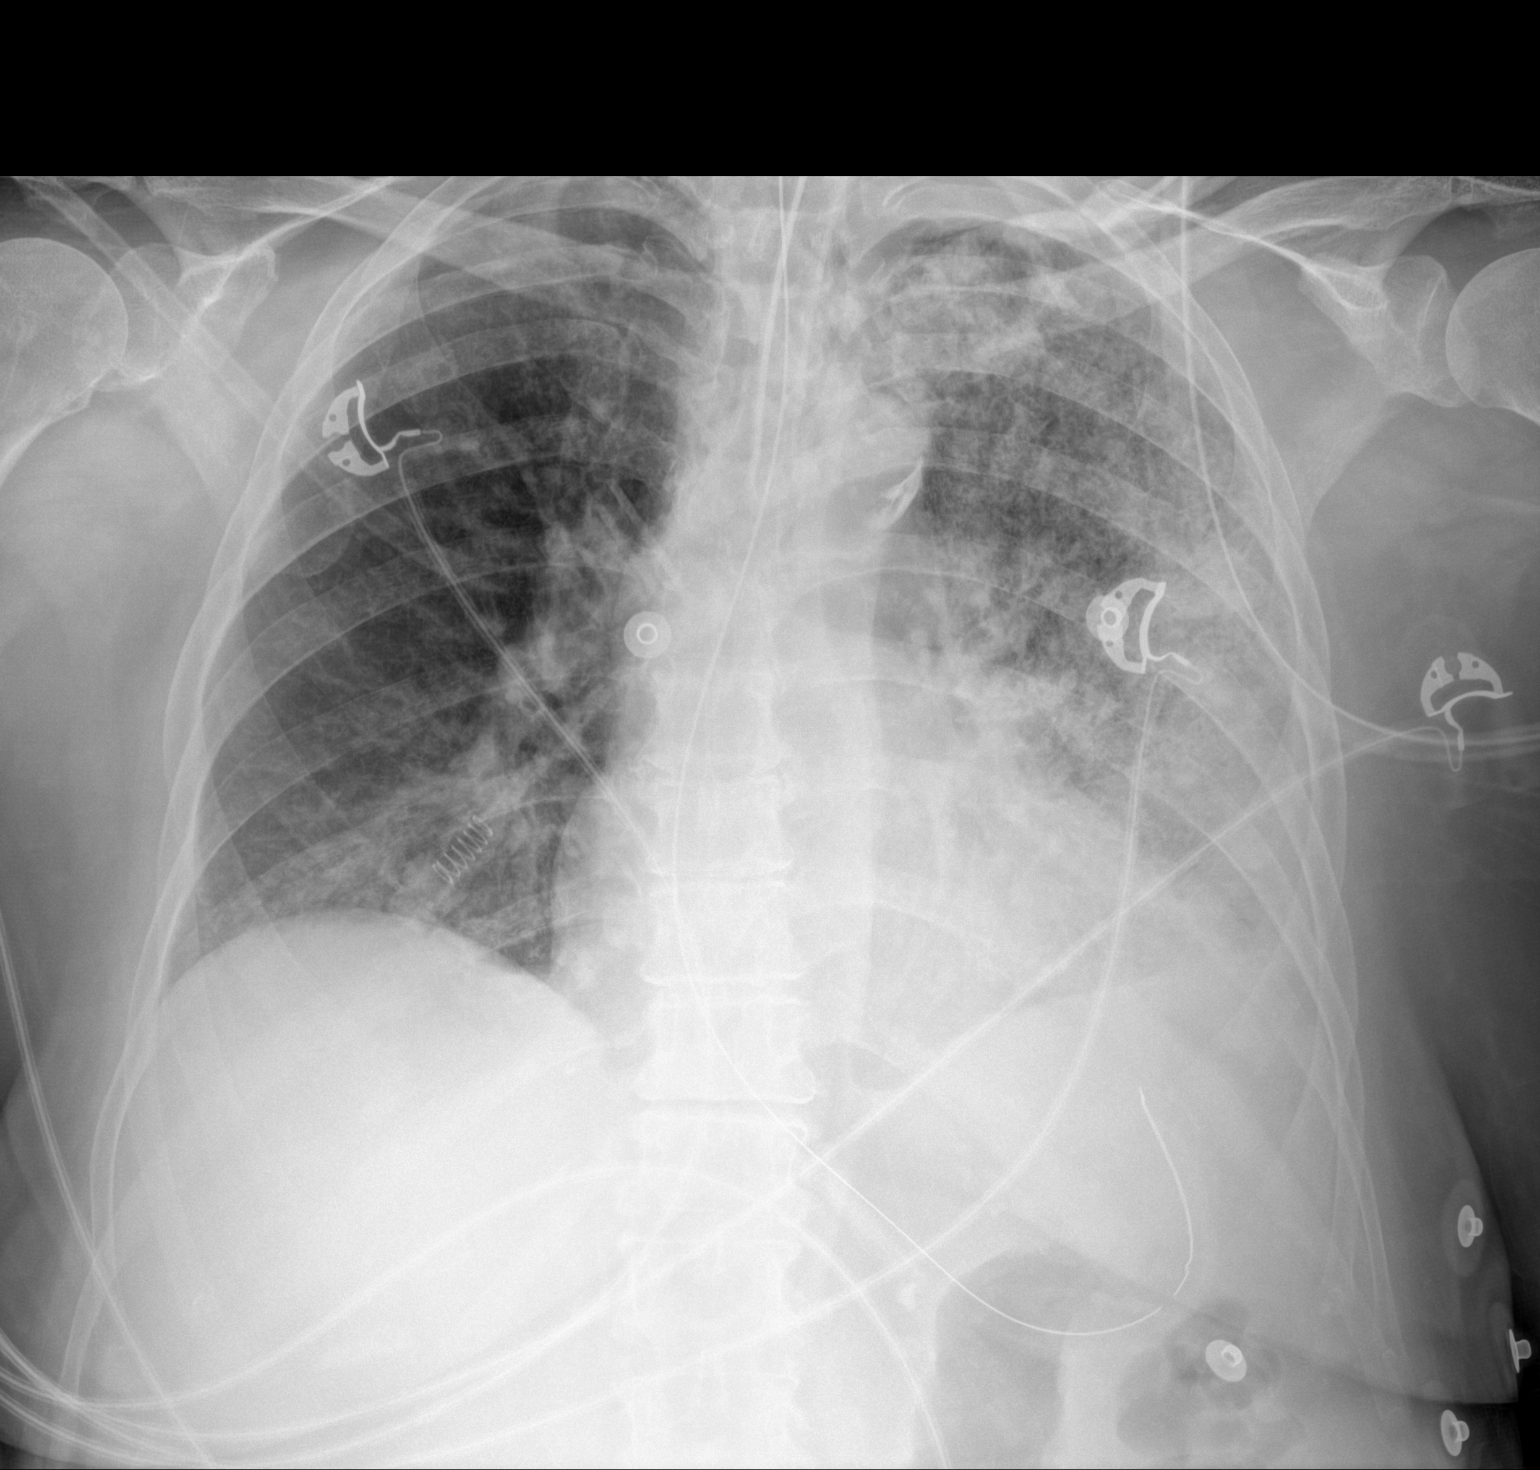

[1 of 1 positions shown; findings below may reference images not displayed]

FINDINGS: Portable AP semi upright view at [1J] hours. Stable lines and tubes.
Mildly regressed widespread patchy airspace and interstitial opacity
in the left lung from earlier today, now resembling that on
[DATE]. Allowing for portable technique the right lung remains
clear. No pneumothorax. Normal cardiac size and mediastinal
contours. Negative visible bowel gas pattern. No acute osseous
abnormality identified.
IMPRESSION: 1. Mildly improved widespread left lung opacity from earlier today.
No new cardiopulmonary abnormality.
2.  Stable lines and tubes.

## 2020-09-11 IMAGING — CT CT ANGIO CHEST
2 of 6 series · 18 of 36 positions shown · IV contrast (APPLIED)
Comparison: Portable chest [GL] hours today.  Chest CTA [DATE].

CLINICAL DATA: 74-year-old female with unexplained altered mental
status. Intubated and unresponsive.

EXAM:
CT ANGIOGRAPHY CHEST WITH CONTRAST
TECHNIQUE: Multidetector CT imaging of the chest was performed using the
standard protocol during bolus administration of intravenous
contrast. Multiplanar CT image reconstructions and MIPs were
obtained to evaluate the vascular anatomy.
CONTRAST:  75mL OMNIPAQUE IOHEXOL 350 MG/ML SOLN

[Series 10: thins · axial · 0.61mm/px · z∈[-531,-293]mm · 17 of 266 slices shown]
[im 14/266  lung]
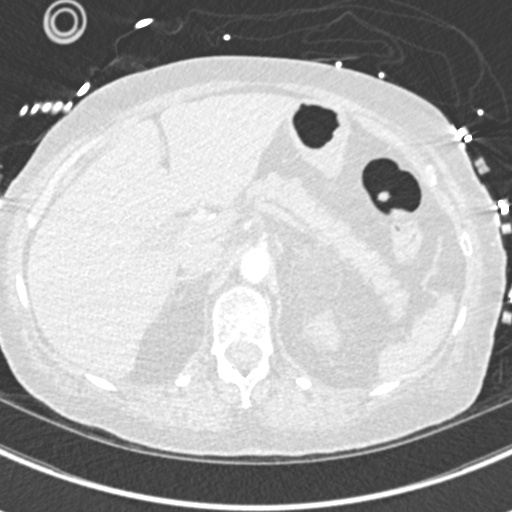
[im 27/266  mediastinal]
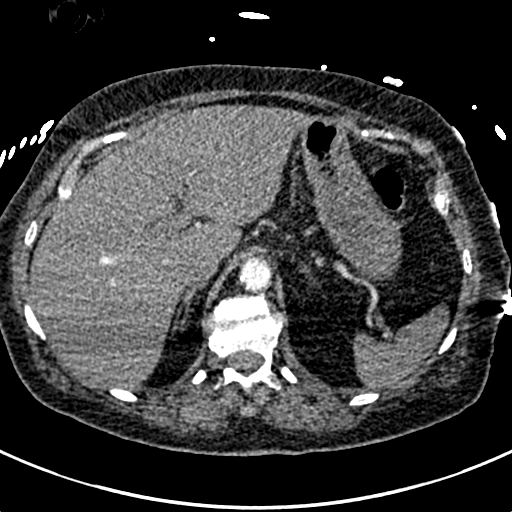
[im 40/266  lung]
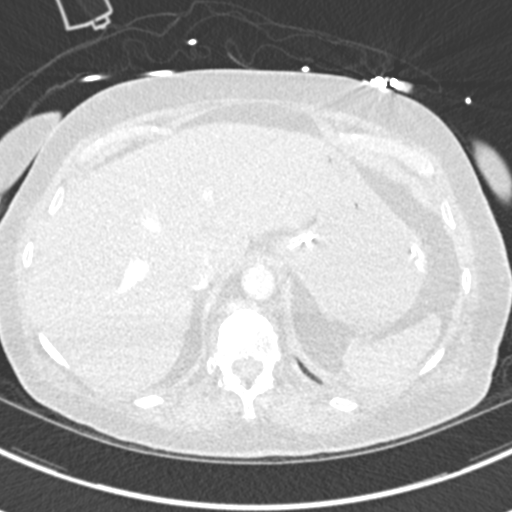
[im 54/266  mediastinal]
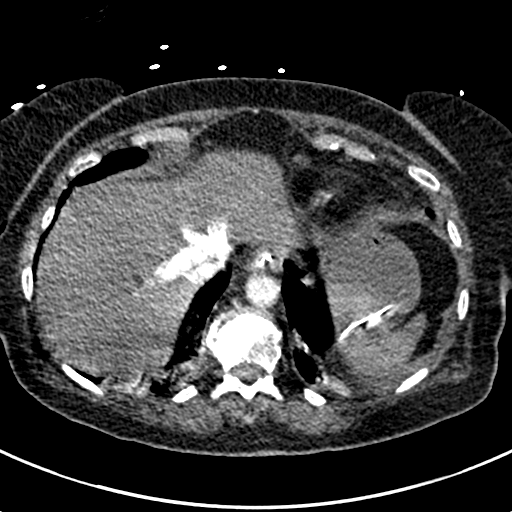
[im 80/266  lung]
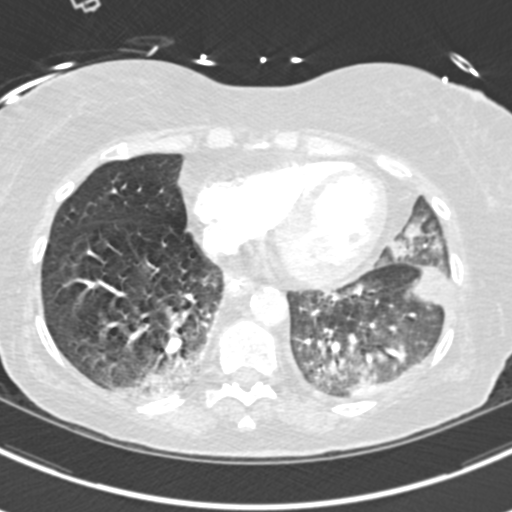
[im 93/266  mediastinal]
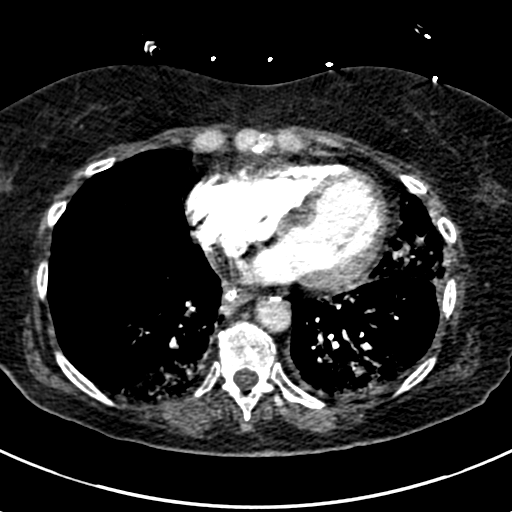
[im 107/266  lung]
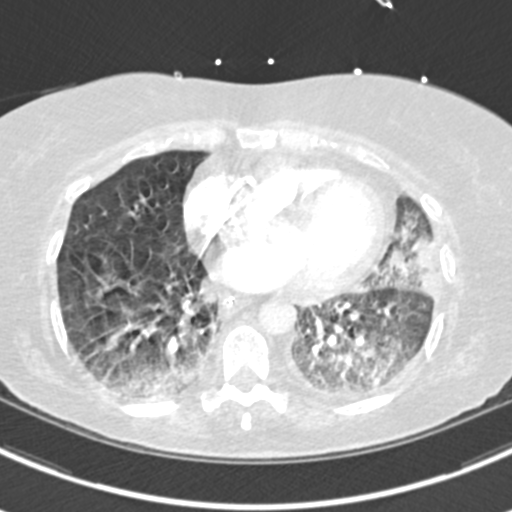
[im 120/266  mediastinal]
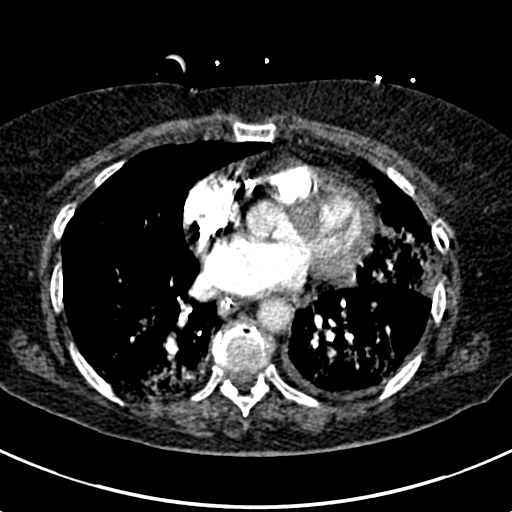
[im 133/266  lung]
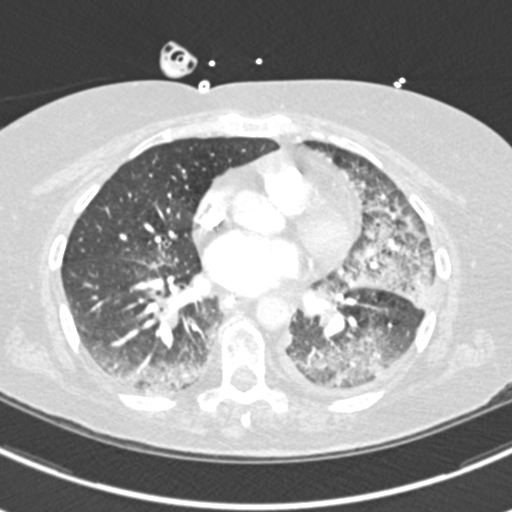
[im 146/266  mediastinal]
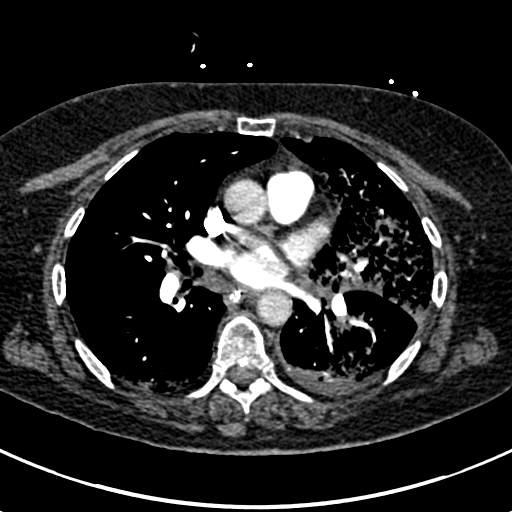
[im 160/266  lung]
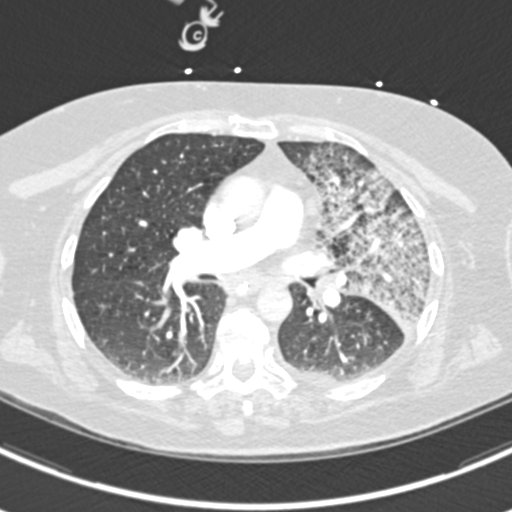
[im 173/266  mediastinal]
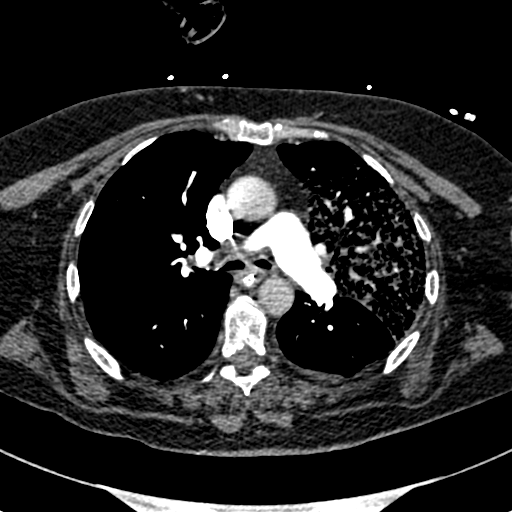
[im 186/266  lung]
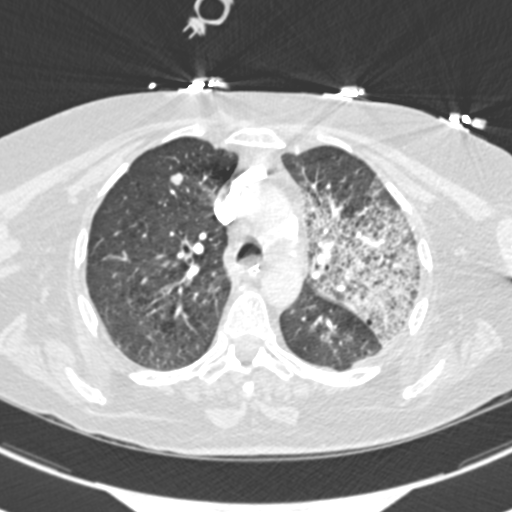
[im 213/266  mediastinal]
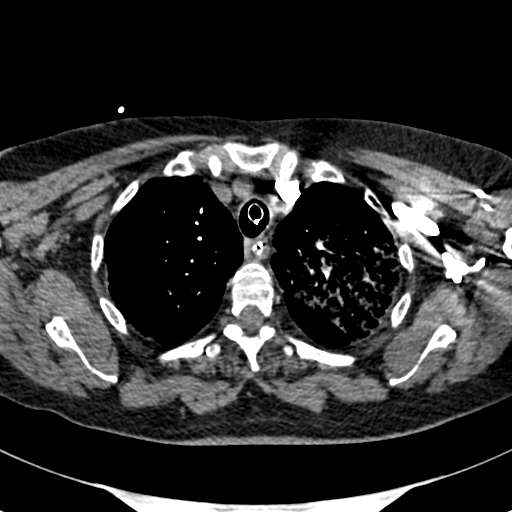
[im 226/266  lung]
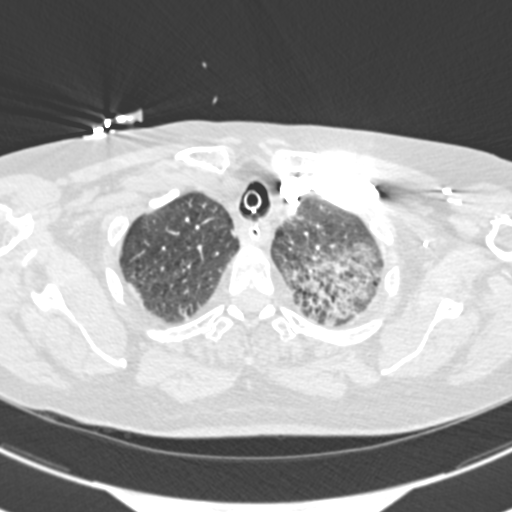
[im 239/266  mediastinal]
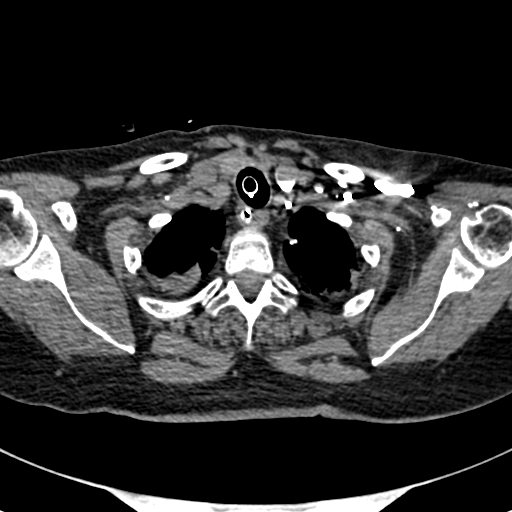
[im 252/266  lung]
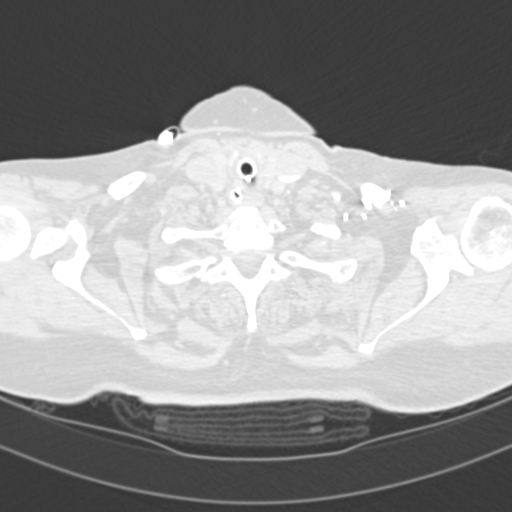

[Series 12: coronal mpr · coronal · 0.54mm/px · 1 of 81 slices shown]
[im 41/81  mediastinal]
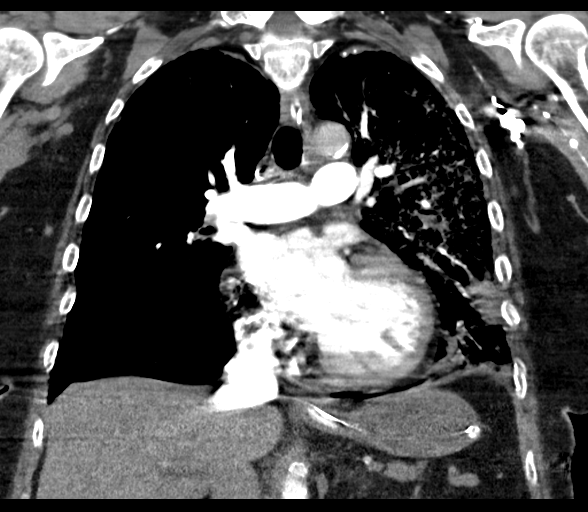

[18 of 36 positions shown; findings below may reference images not displayed]

FINDINGS: Cardiovascular: Good contrast bolus timing in the pulmonary arterial
tree. Mild respiratory motion. There is no central or hilar
pulmonary embolus. The segmental branches also appear to be patent.
There is no convincing subsegmental filling defect.

Calcified coronary artery and Calcified aortic atherosclerosis. No
cardiomegaly or pericardial effusion.

Mediastinum/Nodes: No mediastinal lymphadenopathy. Enteric tube
courses through the esophagus and into the stomach.

Lungs/Pleura: Confluent reticulonodular opacity throughout the left
upper lobe and lingula. Less pronounced similar opacity in the left
lower lobe sparing the superior segment. Similar opacity in the
medial basal segment of the right lower lobe. Some superimposed
dependent atelectasis as well which is enhancing. There is trace
pleural fluid in the left lung.

Endotracheal tube tip in good position above the carina. Atelectatic
changes to the mainstem bronchi.

Underlying centrilobular emphysema. Mildly lobulated right upper
lobe 7-8 mm pulmonary nodule is stable since [GL], and stable by
report since [GL] at that time-benign.

Upper Abdomen: Benign left adrenal myelolipoma. Mild reflux of IV
contrast into the hepatic veins. Otherwise negative visible liver,
gallbladder, spleen, pancreas, right adrenal gland and bowel.
Enteric tube terminates in the stomach.

Musculoskeletal: Advanced degenerative changes in the mid and lower
thoracic spine. No acute osseous abnormality identified.

Review of the MIP images confirms the above findings.
IMPRESSION: 1. Negative for PE. Mild respiratory motion degrades some distal
pulmonary branch detail but there is no convincing pulmonary
embolus.
2. Multilobar Pneumonia maximal in the left upper lobe. Early right
lower lobe involvement. Trace left pleural effusion.
3. Stable and benign right upper lobe pulmonary nodule. Underlying
Aortic Atherosclerosis ([GL]-[GL]) and Emphysema ([GL]-[GL]).
Calcified coronary artery atherosclerosis.

## 2020-09-11 MED ORDER — SODIUM BICARBONATE 8.4 % IV SOLN
50.0000 meq | Freq: Once | INTRAVENOUS | Status: AC
  Administered 2020-09-11: 50 meq via INTRAVENOUS
  Filled 2020-09-11: qty 50

## 2020-09-11 MED ORDER — NOREPINEPHRINE 4 MG/250ML-% IV SOLN: 2.0000 ug/min | INTRAVENOUS | Status: DC

## 2020-09-11 MED ORDER — FENTANYL 2500MCG IN NS 250ML (10MCG/ML) PREMIX INFUSION
25.0000 ug/h | INTRAVENOUS | Status: DC
  Administered 2020-09-11: 25 ug/h via INTRAVENOUS
  Administered 2020-09-11 – 2020-09-12 (×2): 200 ug/h via INTRAVENOUS
  Administered 2020-09-12: 125 ug/h via INTRAVENOUS
  Filled 2020-09-11 (×5): qty 250

## 2020-09-11 MED ORDER — LORAZEPAM 2 MG/ML IJ SOLN
INTRAMUSCULAR | Status: AC
  Administered 2020-09-11: 0.5 mg via INTRAVENOUS
  Filled 2020-09-11: qty 1

## 2020-09-11 MED ORDER — MIDAZOLAM HCL 2 MG/2ML IJ SOLN
1.0000 mg | INTRAMUSCULAR | Status: DC | PRN
  Filled 2020-09-11: qty 2

## 2020-09-11 MED ORDER — CHLORHEXIDINE GLUCONATE 0.12% ORAL RINSE (MEDLINE KIT)
15.0000 mL | Freq: Two times a day (BID) | OROMUCOSAL | Status: DC
  Administered 2020-09-11 – 2020-09-17 (×14): 15 mL via OROMUCOSAL

## 2020-09-11 MED ORDER — POTASSIUM CHLORIDE 10 MEQ/100ML IV SOLN
10.0000 meq | INTRAVENOUS | Status: AC
  Administered 2020-09-11 (×4): 10 meq via INTRAVENOUS
  Filled 2020-09-11 (×4): qty 100

## 2020-09-11 MED ORDER — ORAL CARE MOUTH RINSE
15.0000 mL | OROMUCOSAL | Status: DC
  Administered 2020-09-11 – 2020-09-17 (×66): 15 mL via OROMUCOSAL

## 2020-09-11 MED ORDER — FUROSEMIDE 10 MG/ML IJ SOLN
40.0000 mg | Freq: Once | INTRAMUSCULAR | Status: AC
  Administered 2020-09-11: 40 mg via INTRAVENOUS
  Filled 2020-09-11: qty 4

## 2020-09-11 MED ORDER — POLYETHYLENE GLYCOL 3350 17 G PO PACK: 17.0000 g | PACK | Freq: Every day | ORAL | Status: DC | PRN

## 2020-09-11 MED ORDER — BUDESONIDE 0.25 MG/2ML IN SUSP
0.2500 mg | Freq: Two times a day (BID) | RESPIRATORY_TRACT | Status: DC
  Administered 2020-09-11 – 2020-10-04 (×44): 0.25 mg via RESPIRATORY_TRACT
  Filled 2020-09-11 (×46): qty 2

## 2020-09-11 MED ORDER — LEVOTHYROXINE SODIUM 88 MCG PO TABS
88.0000 ug | ORAL_TABLET | Freq: Every day | ORAL | Status: DC
  Administered 2020-09-11 – 2020-09-17 (×7): 88 ug
  Filled 2020-09-11 (×7): qty 1

## 2020-09-11 MED ORDER — MORPHINE SULFATE (PF) 2 MG/ML IV SOLN
2.0000 mg | Freq: Once | INTRAVENOUS | Status: AC
  Administered 2020-09-11: 2 mg via INTRAVENOUS
  Filled 2020-09-11: qty 1

## 2020-09-11 MED ORDER — DOCUSATE SODIUM 100 MG PO CAPS: 100.0000 mg | ORAL_CAPSULE | Freq: Two times a day (BID) | ORAL | Status: DC | PRN

## 2020-09-11 MED ORDER — FENTANYL CITRATE (PF) 100 MCG/2ML IJ SOLN: 25.0000 ug | Freq: Once | INTRAMUSCULAR | Status: DC

## 2020-09-11 MED ORDER — CLOPIDOGREL BISULFATE 75 MG PO TABS
75.0000 mg | ORAL_TABLET | Freq: Every day | ORAL | Status: DC
  Administered 2020-09-11 – 2020-09-16 (×6): 75 mg
  Filled 2020-09-11 (×6): qty 1

## 2020-09-11 MED ORDER — FENTANYL BOLUS VIA INFUSION
25.0000 ug | INTRAVENOUS | Status: DC | PRN
Start: 2020-09-11 — End: 2020-09-13
  Administered 2020-09-11 – 2020-09-13 (×3): 50 ug via INTRAVENOUS
  Filled 2020-09-11: qty 100

## 2020-09-11 MED ORDER — POTASSIUM CHLORIDE 20 MEQ PO PACK
20.0000 meq | PACK | ORAL | Status: AC
  Administered 2020-09-11 (×2): 20 meq
  Filled 2020-09-11 (×2): qty 1

## 2020-09-11 MED ORDER — POLYETHYLENE GLYCOL 3350 17 G PO PACK
17.0000 g | PACK | Freq: Every day | ORAL | Status: DC
  Administered 2020-09-11 – 2020-09-17 (×7): 17 g
  Filled 2020-09-11 (×6): qty 1

## 2020-09-11 MED ORDER — PROPOFOL 1000 MG/100ML IV EMUL
0.0000 ug/kg/min | INTRAVENOUS | Status: DC
  Administered 2020-09-11: 50 ug/kg/min via INTRAVENOUS
  Administered 2020-09-11: 5 ug/kg/min via INTRAVENOUS
  Administered 2020-09-11 – 2020-09-12 (×5): 50 ug/kg/min via INTRAVENOUS
  Administered 2020-09-12: 40 ug/kg/min via INTRAVENOUS
  Administered 2020-09-12 – 2020-09-13 (×4): 50 ug/kg/min via INTRAVENOUS
  Administered 2020-09-13: 20 ug/kg/min via INTRAVENOUS
  Administered 2020-09-13: 50 ug/kg/min via INTRAVENOUS
  Administered 2020-09-14: 30 ug/kg/min via INTRAVENOUS
  Administered 2020-09-15 (×2): 25 ug/kg/min via INTRAVENOUS
  Filled 2020-09-11 (×17): qty 100

## 2020-09-11 MED ORDER — HEPARIN BOLUS VIA INFUSION
900.0000 [IU] | Freq: Once | INTRAVENOUS | Status: AC
  Administered 2020-09-11: 900 [IU] via INTRAVENOUS
  Filled 2020-09-11: qty 900

## 2020-09-11 MED ORDER — MUPIROCIN 2 % EX OINT
1.0000 "application " | TOPICAL_OINTMENT | Freq: Two times a day (BID) | CUTANEOUS | Status: AC
  Administered 2020-09-11 – 2020-09-15 (×10): 1 via NASAL
  Filled 2020-09-11: qty 22

## 2020-09-11 MED ORDER — SODIUM CHLORIDE 0.9 % IV SOLN
250.0000 mL | INTRAVENOUS | Status: DC
  Administered 2020-09-16: 250 mL via INTRAVENOUS
  Administered 2020-09-24: 10 mL/h via INTRAVENOUS
  Administered 2020-10-11: 250 mL via INTRAVENOUS

## 2020-09-11 MED ORDER — METHYLPREDNISOLONE SODIUM SUCC 40 MG IJ SOLR
40.0000 mg | Freq: Two times a day (BID) | INTRAMUSCULAR | Status: DC
  Administered 2020-09-11 – 2020-09-12 (×2): 40 mg via INTRAVENOUS
  Filled 2020-09-11 (×2): qty 1

## 2020-09-11 MED ORDER — CHLORHEXIDINE GLUCONATE CLOTH 2 % EX PADS: 6.0000 | MEDICATED_PAD | Freq: Every day | CUTANEOUS | Status: DC

## 2020-09-11 MED ORDER — DOCUSATE SODIUM 50 MG/5ML PO LIQD
100.0000 mg | Freq: Two times a day (BID) | ORAL | Status: DC
  Administered 2020-09-11 – 2020-09-17 (×13): 100 mg
  Filled 2020-09-11 (×15): qty 10

## 2020-09-11 MED ORDER — PROPOFOL 1000 MG/100ML IV EMUL
INTRAVENOUS | Status: AC
  Filled 2020-09-11: qty 100

## 2020-09-11 MED ORDER — MIDAZOLAM HCL 2 MG/2ML IJ SOLN
1.0000 mg | INTRAMUSCULAR | Status: DC | PRN
  Administered 2020-09-11 – 2020-09-12 (×2): 1 mg via INTRAVENOUS
  Filled 2020-09-11 (×2): qty 2

## 2020-09-11 MED ORDER — ASPIRIN 81 MG PO CHEW
81.0000 mg | CHEWABLE_TABLET | Freq: Every day | ORAL | Status: DC
  Administered 2020-09-11 – 2020-09-16 (×6): 81 mg
  Filled 2020-09-11 (×6): qty 1

## 2020-09-11 MED ORDER — PHENYLEPHRINE HCL-NACL 10-0.9 MG/250ML-% IV SOLN
0.0000 ug/min | INTRAVENOUS | Status: DC
  Administered 2020-09-11: 50 ug/min via INTRAVENOUS
  Administered 2020-09-11: 60 ug/min via INTRAVENOUS
  Administered 2020-09-11: 80 ug/min via INTRAVENOUS
  Administered 2020-09-11: 40 ug/min via INTRAVENOUS
  Administered 2020-09-11: 20 ug/min via INTRAVENOUS
  Administered 2020-09-12: 50 ug/min via INTRAVENOUS
  Administered 2020-09-12: 40 ug/min via INTRAVENOUS
  Administered 2020-09-12: 30 ug/min via INTRAVENOUS
  Administered 2020-09-12: 50 ug/min via INTRAVENOUS
  Filled 2020-09-11 (×8): qty 250

## 2020-09-11 MED ORDER — FAMOTIDINE IN NACL 20-0.9 MG/50ML-% IV SOLN
20.0000 mg | Freq: Two times a day (BID) | INTRAVENOUS | Status: DC
  Administered 2020-09-11 – 2020-09-15 (×11): 20 mg via INTRAVENOUS
  Filled 2020-09-11 (×12): qty 50

## 2020-09-11 MED ORDER — IOHEXOL 350 MG/ML SOLN
75.0000 mL | Freq: Once | INTRAVENOUS | Status: AC | PRN
  Administered 2020-09-11: 75 mL via INTRAVENOUS

## 2020-09-11 MED ORDER — CHLORHEXIDINE GLUCONATE CLOTH 2 % EX PADS
6.0000 | MEDICATED_PAD | Freq: Every day | CUTANEOUS | Status: DC
  Administered 2020-09-11 – 2020-10-07 (×24): 6 via TOPICAL

## 2020-09-11 MED ORDER — INSULIN ASPART 100 UNIT/ML IJ SOLN
0.0000 [IU] | INTRAMUSCULAR | Status: DC
  Administered 2020-09-11: 2 [IU] via SUBCUTANEOUS
  Administered 2020-09-11: 3 [IU] via SUBCUTANEOUS
  Administered 2020-09-11: 2 [IU] via SUBCUTANEOUS
  Administered 2020-09-11: 3 [IU] via SUBCUTANEOUS
  Administered 2020-09-11 (×2): 2 [IU] via SUBCUTANEOUS
  Administered 2020-09-12: 3 [IU] via SUBCUTANEOUS
  Administered 2020-09-12 (×2): 2 [IU] via SUBCUTANEOUS
  Administered 2020-09-12: 5 [IU] via SUBCUTANEOUS
  Administered 2020-09-12: 2 [IU] via SUBCUTANEOUS
  Administered 2020-09-13 (×2): 3 [IU] via SUBCUTANEOUS
  Administered 2020-09-14 (×3): 2 [IU] via SUBCUTANEOUS
  Administered 2020-09-14 (×2): 3 [IU] via SUBCUTANEOUS
  Administered 2020-09-14 – 2020-09-15 (×4): 2 [IU] via SUBCUTANEOUS
  Administered 2020-09-15 – 2020-09-16 (×2): 3 [IU] via SUBCUTANEOUS
  Administered 2020-09-16: 2 [IU] via SUBCUTANEOUS
  Administered 2020-09-16: 3 [IU] via SUBCUTANEOUS
  Administered 2020-09-16: 2 [IU] via SUBCUTANEOUS
  Administered 2020-09-16: 3 [IU] via SUBCUTANEOUS
  Administered 2020-09-16 – 2020-09-19 (×9): 2 [IU] via SUBCUTANEOUS
  Administered 2020-09-20: 3 [IU] via SUBCUTANEOUS
  Administered 2020-09-20: 5 [IU] via SUBCUTANEOUS
  Administered 2020-09-20 – 2020-09-21 (×5): 2 [IU] via SUBCUTANEOUS
  Administered 2020-09-21: 3 [IU] via SUBCUTANEOUS
  Administered 2020-09-22: 2 [IU] via SUBCUTANEOUS
  Administered 2020-09-22: 3 [IU] via SUBCUTANEOUS
  Administered 2020-09-23 (×2): 2 [IU] via SUBCUTANEOUS
  Administered 2020-09-24: 3 [IU] via SUBCUTANEOUS
  Administered 2020-09-24 – 2020-09-29 (×10): 2 [IU] via SUBCUTANEOUS
  Administered 2020-09-30 (×2): 3 [IU] via SUBCUTANEOUS
  Administered 2020-10-01 (×3): 2 [IU] via SUBCUTANEOUS
  Administered 2020-10-02 (×2): 3 [IU] via SUBCUTANEOUS
  Administered 2020-10-02: 2 [IU] via SUBCUTANEOUS
  Administered 2020-10-03: 3 [IU] via SUBCUTANEOUS
  Administered 2020-10-03 – 2020-10-04 (×7): 2 [IU] via SUBCUTANEOUS
  Administered 2020-10-05: 3 [IU] via SUBCUTANEOUS
  Administered 2020-10-05 – 2020-10-10 (×9): 2 [IU] via SUBCUTANEOUS
  Administered 2020-10-10: 3 [IU] via SUBCUTANEOUS
  Administered 2020-10-11: 2 [IU] via SUBCUTANEOUS
  Filled 2020-09-11 (×88): qty 1

## 2020-09-11 MED ORDER — METOPROLOL TARTRATE 5 MG/5ML IV SOLN
2.5000 mg | Freq: Four times a day (QID) | INTRAVENOUS | Status: DC
  Administered 2020-09-11: 2.5 mg via INTRAVENOUS
  Filled 2020-09-11: qty 5

## 2020-09-11 NOTE — ED Provider Notes (Signed)
-----------------------------------------   12:39 AM on 09/11/2020 -----------------------------------------  Although the patient has been admitted for hours, I was called to the room because she had a sudden mental status change.  Blood pressure was about 192/110 and although she had been able to hit the call button to call in her nurse, she went unresponsive.  She was on BiPAP and maintaining her own breathing, but was no longer responsive including to painful stimuli.  Her oxygen saturation was 100 but she had very thick and wet breath sounds throughout with some frothy pink sputum consistent with flash pulmonary edema.  I alerted the hospitalist team who also alerted Domingo Pulse Rust-Chester, ICU NP, and we met in the room.  For the patient's best interest I intubated her emergently to protect her airway and to support her oxygenation and ventilation with the agreement of Dr. Damita Dunnings with the hospitalist service.  Domingo Pulse Rust-Chester, ICU NP, will put in all of the postintubation orders and to take her to the ICU.  Procedure Name: Intubation Date/Time: 09/11/2020 12:41 AM Performed by: Hinda Kehr, MD Pre-anesthesia Checklist: Patient identified, Emergency Drugs available, Suction available and Patient being monitored Oxygen Delivery Method: Non-rebreather mask Preoxygenation: Pre-oxygenation with 100% oxygen (Bipap) Induction Type: IV induction and Rapid sequence Laryngoscope Size: Glidescope and 3 Tube size: 7.5 mm Number of attempts: 1 Placement Confirmation: ETT inserted through vocal cords under direct vision,  CO2 detector and Breath sounds checked- equal and bilateral Secured at: 23 cm Tube secured with: ETT holder Dental Injury: Teeth and Oropharynx as per pre-operative assessment  Comments: Etomidate 20 mg IV and succinylcholine 100 mg IV used for RSI.  No desaturation noted on pulse oximeter during the procedure.    .Critical Care Performed by: Hinda Kehr,  MD Authorized by: Hinda Kehr, MD   Critical care provider statement:    Critical care time (minutes):  15   Critical care time was exclusive of:  Separately billable procedures and treating other patients   Critical care was necessary to treat or prevent imminent or life-threatening deterioration of the following conditions:  Respiratory failure and CNS failure or compromise   Critical care was time spent personally by me on the following activities:  Development of treatment plan with patient or surrogate, discussions with consultants, evaluation of patient's response to treatment, examination of patient, obtaining history from patient or surrogate, ordering and performing treatments and interventions, ordering and review of laboratory studies, ordering and review of radiographic studies, pulse oximetry, re-evaluation of patient's condition and review of old charts   Care discussed with: admitting provider        Hinda Kehr, MD 09/11/20 [phone]

## 2020-09-11 NOTE — Consult Note (Addendum)
Pharmacy Antibiotic Note  Kathryn Garrett is a 75 y.o. female admitted on 09/10/2020 with pneumonia and sepsis.  Pharmacy has been consulted for cefepime and vancomycin dosing.  MRSA PCR + Also on Doxycycline (azithromycin allergy)  Plan: Cefepime 2g q12H   Will give vancomycin 1500 mg loading dose followed by 500 mg q24H. Predicted AUC of 435, Goal AUC 400-550. Scr used 0.89. Vd 0.5, (BMI > 30), Plan to order vancomycin level in 4-5 days.      Height: 4\' 11"  (149.9 cm) Weight: 63.6 kg (140 lb 3.4 oz) IBW/kg (Calculated) : 43.2  Temp (24hrs), Avg:98.9 F (37.2 C), Min:97.34 F (36.3 C), Max:100.4 F (38 C)  Recent Labs  Lab 09/10/20 1417 09/10/20 1600 09/10/20 1716 09/10/20 2050 09/11/20 0437  WBC 40.2*  --   --   --  30.7*  CREATININE 0.89  --   --   --  0.87  LATICACIDVEN  --  2.5* 2.2* 2.5* 5.5*    Estimated Creatinine Clearance: 45.3 mL/min (by C-G formula based on SCr of 0.87 mg/dL).    Allergies  Allergen Reactions  . Azithromycin Rash    Antimicrobials this admission: 4/29 cefepime >>  4/29 vancomycin >>   Dose adjustments this admission: None  Microbiology results: 4/29 BCx: pending 4/29 MRSA PCR +  Thank you for allowing pharmacy to be a part of this patient's care.  Janira Mandell A, PharmD 09/11/2020 12:28 PM

## 2020-09-11 NOTE — ED Notes (Signed)
Pt became more agitated and restless, purewick applied to patient d/t needing to void, MD made aware of increased agitation, RT requested to come to eval pt, pt airway suctioned d/t mucus present

## 2020-09-11 NOTE — Consult Note (Signed)
[place] for Heparin Indication: chest pain/ACS  Allergies  Allergen Reactions  . Azithromycin Rash    Patient Measurements: Height: 4' 11" (149.9 cm) Weight: 63.6 kg (140 lb 3.4 oz) IBW/kg (Calculated) : 43.2 Heparin Dosing Weight: 57 kg  Vital Signs: Temp: 100.4 F (38 C) (04/30 1145) Temp Source: Bladder (04/30 0600) BP: 137/81 (04/30 1145) Pulse Rate: 139 (04/30 1145)  Labs: Recent Labs    09/10/20 1417 09/10/20 1600 09/10/20 1743 09/10/20 2050 09/11/20 0121 09/11/20 0437 09/11/20 1008  HGB 13.5  --   --   --   --  14.2  --   HCT 42.8  --   --   --   --  44.8  --   PLT 429*  --   --   --   --  324  --   APTT  --  28  --   --   --   --   --   LABPROT  --   --  17.0*  --   --   --   --   INR  --   --  1.4*  --   --   --   --   HEPARINUNFRC  --   --   --   --  0.50  --  0.24*  CREATININE 0.89  --   --   --   --  0.87  --   TROPONINIHS 70* 745*  --  1,838*  --  2,434*  --     Estimated Creatinine Clearance: 45.3 mL/min (by C-G formula based on SCr of 0.87 mg/dL).   Medical History: Past Medical History:  Diagnosis Date  . Anemia, mild   . Arthritis    "qwhere" (02/10/2016)  . CAD (coronary artery disease)    a. VF arrest 01/2009/CAD with inferoposterior MI s/p aspiration thrombectomy/BMS of RCA at that time. b. ISR of BMS s/p DES to RCA 06/2010 with moderate LM/LAD disease not significant by FFR. c. s/p angiosculpt PTCA to prox RCA 01/2016 for ISR. d. Aggressive PTCA to prox RCA for ISR 05/2016.  . Cardiac arrest ([place]) 01/2009   a. in setting of inf-post STEMI 01/2009 (VF).  . Chest pain 06/04/2016  . Chronic bronchitis ([place])   . Chronic combined systolic and diastolic CHF (congestive heart failure) ([place])    a. EF 40-45% by cath 2010. b. 60-65% with grade 1 DD by echo 09/2015  . COPD (chronic obstructive pulmonary disease) ([place])   . Hyperlipidemia 01/28/2016  . Hyperlipidemia LDL goal <70 01/28/2016  . Hypertension   . Lung  mass   . MI (myocardial infarction) ([place]) 01/2009  . Narcotic abuse ([place])    pt now taking Suboxone tid  . Pleural effusion on left 09/24/2015  . Pneumonia 06/2014; 09/2015  . Pre-diabetes   . Pulmonary nodule   . PVD (peripheral vascular disease) ([facility])    mild atherosclerosis of infrarenal aorta, 25% ostial left renal artery stenosis, 50% ostial right common iliac by cath 2010  . S/P PTCA (percutaneous transluminal coronary angioplasty) 02/10/16 to RCA lesion for in stent restenois 02/11/2016  . Subclavian artery stenosis ([facility])    a. >50% by duplex 05/2016.  . Tobacco abuse   . Unstable angina ([facility])     Medications:  Medications Prior to Admission  Medication Sig Dispense Refill Last Dose  . acetaminophen (TYLENOL) 500 MG tablet Take 1,000 mg by mouth every 6 (six) hours as needed for headache.  09/09/2020 at Unknown time  . albuterol (PROVENTIL HFA;VENTOLIN HFA) 108 (90 Base) MCG/ACT inhaler Inhale 2 puffs into the lungs every 4 (four) hours as needed for wheezing or shortness of breath.   09/10/2020 at Unknown time  . amoxicillin (AMOXIL) 500 MG tablet Take 500 mg by mouth 3 (three) times daily.   09/10/2020 at Unknown time  . aspirin EC 81 MG tablet Take 81 mg by mouth at bedtime.    09/09/2020 at Unknown time  . azelastine (ASTELIN) 0.1 % nasal spray USE 2 SPRAYS BID UNTIL DIRECTED TO STOP. USE FOR RUNNY NOSE  0 09/10/2020 at Unknown time  . budesonide-formoterol (SYMBICORT) 160-4.5 MCG/ACT inhaler Inhale 1 puff into the lungs 2 (two) times daily. 3 Inhaler 0 09/10/2020 at Unknown time  . Buprenorphine HCl-Naloxone HCl 8-2 MG FILM Place 1 Film under the tongue 3 (three) times daily.   09/10/2020 at Unknown time  . cetirizine (ZYRTEC) 10 MG tablet Take 1 tablet (10 mg total) by mouth daily. 30 tablet 0 Past Week at Unknown time  . clopidogrel (PLAVIX) 75 MG tablet TAKE 1 TABLET(75 MG) BY MOUTH AT BEDTIME 90 tablet 2 09/09/2020 at Unknown time  . dexamethasone (DECADRON) 6 MG tablet dexamethasone  6 mg tablet  TAKE 1 TABLET BY MOUTH EVERY DAY   09/10/2020 at Unknown time  . doxycycline (VIBRAMYCIN) 100 MG capsule doxycycline hyclate 100 mg capsule  TAKE 1 CAPSULE BY MOUTH TWICE DAILY   09/10/2020 at Unknown time  . fluticasone (FLONASE) 50 MCG/ACT nasal spray Place 1 spray into both nostrils daily.   09/10/2020 at Unknown time  . furosemide (LASIX) 40 MG tablet Take 1 tablet (40 mg total) by mouth daily as needed (swelling). 90 tablet 3 09/10/2020 at Unknown time  . ibuprofen (ADVIL,MOTRIN) 600 MG tablet Take 600 mg by mouth as needed for pain.  0 09/10/2020 at Unknown time  . isosorbide mononitrate (IMDUR) 30 MG 24 hr tablet Take 1 tablet (30 mg total) by mouth daily. 90 tablet 3 09/10/2020 at Unknown time  . levothyroxine (SYNTHROID) 88 MCG tablet Take 88 mcg by mouth every morning.   09/10/2020 at Unknown time  . metoprolol tartrate (LOPRESSOR) 25 MG tablet Take 50 mg by mouth 2 (two) times daily.   09/10/2020 at Unknown time  . mupirocin ointment (BACTROBAN) 2 % mupirocin 2 % topical ointment  APPLY TOPICALLY TO LOWER LEG THREE TIMES DAILY FOR 10 DAYS   09/10/2020 at Unknown time  . nystatin cream (MYCOSTATIN) Apply topically 2 (two) times daily.   09/10/2020 at Unknown time  . potassium chloride SA (KLOR-CON) 20 MEQ tablet Take 1 tablet (20 mEq total) by mouth daily as needed (Take with furosemide (lasix)). 90 tablet 3 09/10/2020 at Unknown time  . predniSONE (DELTASONE) 20 MG tablet prednisone 20 mg tablet   09/10/2020 at Unknown time  . rosuvastatin (CRESTOR) 20 MG tablet TAKE 1 TABLET(20 MG) BY MOUTH DAILY 90 tablet 2 09/09/2020 at Unknown time  . TRELEGY ELLIPTA 100-62.5-25 MCG/INH AEPB Take 1 puff by mouth daily.   09/10/2020 at Unknown time  . triamcinolone cream (KENALOG) 0.1 % Apply topically 2 (two) times daily. to affected area   09/10/2020 at Unknown time  . nitroGLYCERIN (NITROSTAT) 0.4 MG SL tablet Place 1 tablet (0.4 mg total) under the tongue every 5 (five) minutes as needed for chest  pain. 25 tablet 4 prn at prn   Scheduled:  . aspirin  81 mg Per Tube QHS  . budesonide (PULMICORT) nebulizer solution  0.25 mg Nebulization BID  . chlorhexidine gluconate (MEDLINE KIT)  15 mL Mouth Rinse BID  . Chlorhexidine Gluconate Cloth  6 each Topical QHS  . clopidogrel  75 mg Per Tube QHS  . docusate  100 mg Per Tube BID  . fentaNYL (SUBLIMAZE) injection  25 mcg Intravenous Once  . fluticasone  1 spray Each Nare Daily  . insulin aspart  0-15 Units Subcutaneous Q4H  . ipratropium-albuterol  3 mL Nebulization Q6H  . levothyroxine  88 mcg Per Tube Q0600  . mouth rinse  15 mL Mouth Rinse 10 times per day  . methylPREDNISolone (SOLU-MEDROL) injection  40 mg Intravenous Q12H  . metoprolol tartrate  2.5 mg Intravenous Q6H  . mupirocin ointment  1 application Nasal BID  . polyethylene glycol  17 g Per Tube Daily   Infusions:  . sodium chloride    . ceFEPime (MAXIPIME) IV Stopped (09/11/20 0537)  . doxycycline (VIBRAMYCIN) IV 125 mL/hr at 09/11/20 0700  . famotidine (PEPCID) IV Stopped (09/11/20 0316)  . fentaNYL infusion INTRAVENOUS 150 mcg/hr (09/11/20 1006)  . heparin 800 Units/hr (09/11/20 0700)  . phenylephrine (NEO-SYNEPHRINE) Adult infusion 40 mcg/min (09/11/20 0952)  . propofol (DIPRIVAN) infusion 60 mcg/kg/min (09/11/20 1214)  . vancomycin     PRN: acetaminophen **OR** acetaminophen, docusate sodium, fentaNYL, midazolam, midazolam, polyethylene glycol Anti-infectives (From admission, onward)   Start     Dose/Rate Route Frequency Ordered Stop   09/11/20 1800  vancomycin (VANCOREADY) IVPB 500 mg/100 mL        500 mg 100 mL/hr over 60 Minutes Intravenous Every 24 hours 09/10/20 1754     09/11/20 0600  ceFEPIme (MAXIPIME) 2 g in sodium chloride 0.9 % 100 mL IVPB        2 g 200 mL/hr over 30 Minutes Intravenous Every 12 hours 09/10/20 1754     09/10/20 1900  vancomycin (VANCOREADY) IVPB 1500 mg/300 mL        1,500 mg 150 mL/hr over 120 Minutes Intravenous  Once 09/10/20  1754 09/10/20 2307   09/10/20 1800  doxycycline (VIBRAMYCIN) 100 mg in sodium chloride 0.9 % 250 mL IVPB        100 mg 125 mL/hr over 120 Minutes Intravenous Every 12 hours 09/10/20 1703     09/10/20 1530  ceFEPIme (MAXIPIME) 2 g in sodium chloride 0.9 % 100 mL IVPB        2 g 200 mL/hr over 30 Minutes Intravenous  Once 09/10/20 1516 09/10/20 1633   09/10/20 1530  azithromycin (ZITHROMAX) 500 mg in sodium chloride 0.9 % 250 mL IVPB  Status:  Discontinued        500 mg 250 mL/hr over 60 Minutes Intravenous  Once 09/10/20 1516 09/10/20 1702      Assessment: Pharmacy consulted to start heparin for NSTEMI. Trop 745. No DOAC PTA noted.   4/30:  HL @ 0121 = 0.5 Will continue pt on current rate  Goal of Therapy:  Heparin level 0.3-0.7 units/ml Monitor platelets by anticoagulation protocol: Yes   Plan:  4/30:  HL @ 1008 = 0.24 Will order 900 unit bolus x 1 and increase drip rate to 900 units/hr and recheck HL in 8 hrs on   Merrill,Kristin A, PharmD 09/11/2020,12:14 PM

## 2020-09-11 NOTE — ED Notes (Signed)
ED MD to bedside to eval pt, BiPAP in place after adjustment of mask fit from RT during eval; hospitalist MD notified of change in patient condition d/t increased WOB and worsening lung sounds

## 2020-09-11 NOTE — Consult Note (Signed)
ANTICOAGULATION CONSULT NOTE - Initial Consult  Pharmacy Consult for Heparin Indication: chest pain/ACS  Allergies  Allergen Reactions  . Azithromycin Rash    Patient Measurements: Height: 4\' 11"  (149.9 cm) Weight: 63.6 kg (140 lb 3.4 oz) IBW/kg (Calculated) : 43.2 Heparin Dosing Weight: 68.2 kg  Vital Signs: Temp: 98.06 F (36.7 C) (04/30 0330) Temp Source: Axillary (04/30 0330) BP: 110/58 (04/30 0330) Pulse Rate: 133 (04/30 0330)  Labs: Recent Labs    09/10/20 1417 09/10/20 1600 09/10/20 1743 09/10/20 2050 09/11/20 0121  HGB 13.5  --   --   --   --   HCT 42.8  --   --   --   --   PLT 429*  --   --   --   --   APTT  --  28  --   --   --   LABPROT  --   --  17.0*  --   --   INR  --   --  1.4*  --   --   HEPARINUNFRC  --   --   --   --  0.50  CREATININE 0.89  --   --   --   --   TROPONINIHS 70* 745*  --  1,838*  --     Estimated Creatinine Clearance: 44.3 mL/min (by C-G formula based on SCr of 0.89 mg/dL).   Medical History: Past Medical History:  Diagnosis Date  . Anemia, mild   . Arthritis    "qwhere" (02/10/2016)  . CAD (coronary artery disease)    a. VF arrest 01/2009/CAD with inferoposterior MI s/p aspiration thrombectomy/BMS of RCA at that time. b. ISR of BMS s/p DES to RCA 06/2010 with moderate LM/LAD disease not significant by FFR. c. s/p angiosculpt PTCA to prox RCA 01/2016 for ISR. d. Aggressive PTCA to prox RCA for ISR 05/2016.  . Cardiac arrest ([place]) 01/2009   a. in setting of inf-post STEMI 01/2009 (VF).  . Chest pain 06/04/2016  . Chronic bronchitis ([place])   . Chronic combined systolic and diastolic CHF (congestive heart failure) ([place])    a. EF 40-45% by cath 2010. b. 60-65% with grade 1 DD by echo 09/2015  . COPD (chronic obstructive pulmonary disease) ([place])   . Hyperlipidemia 01/28/2016  . Hyperlipidemia LDL goal <70 01/28/2016  . Hypertension   . Lung mass   . MI (myocardial infarction) ([place]) 01/2009  . Narcotic abuse ([place])    pt now taking  Suboxone tid  . Pleural effusion on left 09/24/2015  . Pneumonia 06/2014; 09/2015  . Pre-diabetes   . Pulmonary nodule   . PVD (peripheral vascular disease) ([facility])    mild atherosclerosis of infrarenal aorta, 25% ostial left renal artery stenosis, 50% ostial right common iliac by cath 2010  . S/P PTCA (percutaneous transluminal coronary angioplasty) 02/10/16 to RCA lesion for in stent restenois 02/11/2016  . Subclavian artery stenosis ([facility])    a. >50% by duplex 05/2016.  . Tobacco abuse   . Unstable angina ([facility])     Medications:  Medications Prior to Admission  Medication Sig Dispense Refill Last Dose  . acetaminophen (TYLENOL) 500 MG tablet Take 1,000 mg by mouth every 6 (six) hours as needed for headache.   09/09/2020 at Unknown time  . albuterol (PROVENTIL HFA;VENTOLIN HFA) 108 (90 Base) MCG/ACT inhaler Inhale 2 puffs into the lungs every 4 (four) hours as needed for wheezing or shortness of breath.   09/10/2020 at Unknown time  . amoxicillin (  AMOXIL) 500 MG tablet Take 500 mg by mouth 3 (three) times daily.   09/10/2020 at Unknown time  . aspirin EC 81 MG tablet Take 81 mg by mouth at bedtime.    09/09/2020 at Unknown time  . azelastine (ASTELIN) 0.1 % nasal spray USE 2 SPRAYS BID UNTIL DIRECTED TO STOP. USE FOR RUNNY NOSE  0 09/10/2020 at Unknown time  . budesonide-formoterol (SYMBICORT) 160-4.5 MCG/ACT inhaler Inhale 1 puff into the lungs 2 (two) times daily. 3 Inhaler 0 09/10/2020 at Unknown time  . Buprenorphine HCl-Naloxone HCl 8-2 MG FILM Place 1 Film under the tongue 3 (three) times daily.   09/10/2020 at Unknown time  . cetirizine (ZYRTEC) 10 MG tablet Take 1 tablet (10 mg total) by mouth daily. 30 tablet 0 Past Week at Unknown time  . clopidogrel (PLAVIX) 75 MG tablet TAKE 1 TABLET(75 MG) BY MOUTH AT BEDTIME 90 tablet 2 09/09/2020 at Unknown time  . dexamethasone (DECADRON) 6 MG tablet dexamethasone 6 mg tablet  TAKE 1 TABLET BY MOUTH EVERY DAY   09/10/2020 at Unknown time  . doxycycline  (VIBRAMYCIN) 100 MG capsule doxycycline hyclate 100 mg capsule  TAKE 1 CAPSULE BY MOUTH TWICE DAILY   09/10/2020 at Unknown time  . fluticasone (FLONASE) 50 MCG/ACT nasal spray Place 1 spray into both nostrils daily.   09/10/2020 at Unknown time  . furosemide (LASIX) 40 MG tablet Take 1 tablet (40 mg total) by mouth daily as needed (swelling). 90 tablet 3 09/10/2020 at Unknown time  . ibuprofen (ADVIL,MOTRIN) 600 MG tablet Take 600 mg by mouth as needed for pain.  0 09/10/2020 at Unknown time  . isosorbide mononitrate (IMDUR) 30 MG 24 hr tablet Take 1 tablet (30 mg total) by mouth daily. 90 tablet 3 09/10/2020 at Unknown time  . levothyroxine (SYNTHROID) 88 MCG tablet Take 88 mcg by mouth every morning.   09/10/2020 at Unknown time  . metoprolol tartrate (LOPRESSOR) 25 MG tablet Take 50 mg by mouth 2 (two) times daily.   09/10/2020 at Unknown time  . mupirocin ointment (BACTROBAN) 2 % mupirocin 2 % topical ointment  APPLY TOPICALLY TO LOWER LEG THREE TIMES DAILY FOR 10 DAYS   09/10/2020 at Unknown time  . nystatin cream (MYCOSTATIN) Apply topically 2 (two) times daily.   09/10/2020 at Unknown time  . potassium chloride SA (KLOR-CON) 20 MEQ tablet Take 1 tablet (20 mEq total) by mouth daily as needed (Take with furosemide (lasix)). 90 tablet 3 09/10/2020 at Unknown time  . predniSONE (DELTASONE) 20 MG tablet prednisone 20 mg tablet   09/10/2020 at Unknown time  . rosuvastatin (CRESTOR) 20 MG tablet TAKE 1 TABLET(20 MG) BY MOUTH DAILY 90 tablet 2 09/09/2020 at Unknown time  . TRELEGY ELLIPTA 100-62.5-25 MCG/INH AEPB Take 1 puff by mouth daily.   09/10/2020 at Unknown time  . triamcinolone cream (KENALOG) 0.1 % Apply topically 2 (two) times daily. to affected area   09/10/2020 at Unknown time  . nitroGLYCERIN (NITROSTAT) 0.4 MG SL tablet Place 1 tablet (0.4 mg total) under the tongue every 5 (five) minutes as needed for chest pain. 25 tablet 4 prn at prn   Scheduled:  . aspirin EC  81 mg Oral QHS  . chlorhexidine  gluconate (MEDLINE KIT)  15 mL Mouth Rinse BID  . Chlorhexidine Gluconate Cloth  6 each Topical Daily  . clopidogrel  75 mg Per Tube QHS  . docusate  100 mg Per Tube BID  . fentaNYL (SUBLIMAZE) injection  25 mcg  Intravenous Once  . fluticasone  1 spray Each Nare Daily  . insulin aspart  0-15 Units Subcutaneous Q4H  . ipratropium-albuterol  3 mL Nebulization Q6H  . levothyroxine  88 mcg Per Tube Q0600  . mouth rinse  15 mL Mouth Rinse 10 times per day  . methylPREDNISolone (SOLU-MEDROL) injection  40 mg Intravenous Q12H  . polyethylene glycol  17 g Per Tube Daily   Infusions:  . sodium chloride    . ceFEPime (MAXIPIME) IV    . doxycycline (VIBRAMYCIN) IV Stopped (09/10/20 2026)  . famotidine (PEPCID) IV 100 mL/hr at 09/11/20 0300  . fentaNYL infusion INTRAVENOUS 100 mcg/hr (09/11/20 0300)  . heparin 800 Units/hr (09/11/20 0300)  . phenylephrine (NEO-SYNEPHRINE) Adult infusion    . propofol (DIPRIVAN) infusion 50 mcg/kg/min (09/11/20 0300)  . vancomycin     PRN: acetaminophen **OR** acetaminophen, albuterol, docusate sodium, fentaNYL, midazolam, midazolam, polyethylene glycol Anti-infectives (From admission, onward)   Start     Dose/Rate Route Frequency Ordered Stop   09/11/20 1800  vancomycin (VANCOREADY) IVPB 500 mg/100 mL        500 mg 100 mL/hr over 60 Minutes Intravenous Every 24 hours 09/10/20 1754     09/11/20 0600  ceFEPIme (MAXIPIME) 2 g in sodium chloride 0.9 % 100 mL IVPB        2 g 200 mL/hr over 30 Minutes Intravenous Every 12 hours 09/10/20 1754     09/10/20 1900  vancomycin (VANCOREADY) IVPB 1500 mg/300 mL        1,500 mg 150 mL/hr over 120 Minutes Intravenous  Once 09/10/20 1754 09/10/20 2307   09/10/20 1800  doxycycline (VIBRAMYCIN) 100 mg in sodium chloride 0.9 % 250 mL IVPB        100 mg 125 mL/hr over 120 Minutes Intravenous Every 12 hours 09/10/20 1703     09/10/20 1530  ceFEPIme (MAXIPIME) 2 g in sodium chloride 0.9 % 100 mL IVPB        2 g 200 mL/hr over  30 Minutes Intravenous  Once 09/10/20 1516 09/10/20 1633   09/10/20 1530  azithromycin (ZITHROMAX) 500 mg in sodium chloride 0.9 % 250 mL IVPB  Status:  Discontinued        500 mg 250 mL/hr over 60 Minutes Intravenous  Once 09/10/20 1516 09/10/20 1702      Assessment: Pharmacy consulted to start heparin for NSTEMI. Trop 745. No DOAC PTA noted.   Goal of Therapy:  Heparin level 0.3-0.7 units/ml Monitor platelets by anticoagulation protocol: Yes   Plan:  4/30:  HL @ 0121 = 0.5 Will continue pt on current rate and recheck HL in 8 hrs on 4/30 @ 0900 .   Matteson Blue D, PharmD 09/11/2020,4:12 AM

## 2020-09-11 NOTE — Progress Notes (Signed)
*  PRELIMINARY RESULTS* Echocardiogram 2D Echocardiogram has been performed.  Kathryn Garrett 09/11/2020, 2:02 PM

## 2020-09-11 NOTE — ED Notes (Signed)
Pt intubated with Dr. Karma Greaser at bedside, given 20 mg etomidate IV and 100 succinylcholine IV for intubation; Toribio Harbour, ARNP and Dr. Damita Dunnings at bedside to eval pt and orders given for CXR and OG tube XR

## 2020-09-11 NOTE — Progress Notes (Signed)
Pharmacy Electrolyte Monitoring Consult:  Pharmacy consulted to assist in monitoring and replacing electrolytes in this 75 y.o. female admitted on 09/10/2020 with Respiratory Distress   Labs:  Sodium (mmol/L)  Date Value  09/11/2020 142  10/02/2019 141  03/23/2013 138   Potassium (mmol/L)  Date Value  09/11/2020 3.3 (L)  03/23/2013 4.2   Magnesium (mg/dL)  Date Value  09/11/2020 2.1  03/20/2013 2.0   Phosphorus (mg/dL)  Date Value  09/11/2020 5.1 (H)   Calcium (mg/dL)  Date Value  09/11/2020 8.2 (L)   Calcium, Total (mg/dL)  Date Value  03/23/2013 9.0   Albumin (g/dL)  Date Value  09/11/2020 3.4 (L)  10/02/2019 4.2  03/19/2013 3.3 (L)    Assessment/Plan: K 3.3  Mag 2.1  Phos 5.1  Scr 0.87  Na 142 Patient received Lasix x 2 this am NP ordered KCL 10 meq IV x4 doses and KCL 20 meq per tube q4h x 2 doses. Will f/u with electrolytes in am per CCM monitoring  Ayesha Markwell A 09/11/2020 12:30 PM

## 2020-09-11 NOTE — Consult Note (Addendum)
Cardiology Consult    Patient ID: Kathryn Garrett MRN: [id number], DOB/AGE: 1946-05-09   Admit date: 09/10/2020 Date of Consult: 09/11/2020  Primary Physician: Imagene Riches, NP Primary Cardiologist: Larae Grooms, MD Requesting Provider: F. Lanney Gins, MD  Patient Profile    Kathryn Garrett is a 75 y.o. female with a history of CAD status post prior VF arrest and RCA stenting complicated by prior restenosis, chronic combined systolic diastolic congestive heart failure, hypertension, hyperlipidemia, ongoing tobacco abuse, COPD, anemia, and narcotic abuse on chronic Suboxone therapy, who is being seen today for the evaluation of non-STEMI and acute pulmonary edema in the setting of respiratory failure/pneumonia at the request of Dr. Lanney Gins.  Past Medical History   Past Medical History:  Diagnosis Date  . Anemia, mild   . Arthritis    "qwhere" (02/10/2016)  . CAD (coronary artery disease)    a. VF arrest 01/2009/CAD with inferoposterior MI s/p aspiration thrombectomy/BMS of RCA at that time. b. ISR of BMS s/p DES to RCA 06/2010 with moderate LM/LAD disease not significant by FFR. c. s/p angiosculpt PTCA to prox RCA 01/2016 for ISR. d. Aggressive PTCA to prox RCA for ISR 05/2016.  . Cardiac arrest ([place]) 01/2009   a. in setting of inf-post STEMI 01/2009 (VF).  . Chest pain 06/04/2016  . Chronic bronchitis ([place])   . Chronic combined systolic and diastolic CHF (congestive heart failure) ([place])    a. EF 40-45% by cath 2010. b. 60-65% with grade 1 DD by echo 09/2015  . COPD (chronic obstructive pulmonary disease) ([place])   . Hyperlipidemia 01/28/2016  . Hyperlipidemia LDL goal <70 01/28/2016  . Hypertension   . Lung mass   . MI (myocardial infarction) ([place]) 01/2009  . Narcotic abuse ([place])    pt now taking Suboxone tid  . Pleural effusion on left 09/24/2015  . Pneumonia 06/2014; 09/2015  . Pre-diabetes   . Pulmonary nodule   . PVD (peripheral vascular disease) ([facility])    mild  atherosclerosis of infrarenal aorta, 25% ostial left renal artery stenosis, 50% ostial right common iliac by cath 2010  . S/P PTCA (percutaneous transluminal coronary angioplasty) 02/10/16 to RCA lesion for in stent restenois 02/11/2016  . Subclavian artery stenosis ([facility])    a. >50% by duplex 05/2016.  . Tobacco abuse   . Unstable angina [facility])     Past Surgical History:  Procedure Laterality Date  . ABDOMINAL HYSTERECTOMY    . ANKLE FRACTURE SURGERY Right 2000s  . CARDIAC CATHETERIZATION N/A 02/10/2016   Procedure: Left Heart Cath and Coronary Angiography;  Surgeon: Jettie Booze, MD;  Location: [place] [facility];  Service: Cardiovascular;  Laterality: N/A;  . CARDIAC CATHETERIZATION N/A 02/10/2016   Procedure: Coronary Balloon Angioplasty;  Surgeon: Jettie Booze, MD;  Location: [place] [facility];  Service: Cardiovascular;  Laterality: N/A;  instent RCA  . CARDIAC CATHETERIZATION N/A 06/05/2016   Procedure: Left Heart Cath and Coronary Angiography;  Surgeon: Leonie Man, MD;  Location: [place] [facility];  Service: Cardiovascular;  Laterality: N/A;  . CARDIAC CATHETERIZATION N/A 06/05/2016   Procedure: Coronary Balloon Angioplasty;  Surgeon: Leonie Man, MD;  Location: [place] [facility];  Service: Cardiovascular;  Laterality: N/A;  . CHEST TUBE INSERTION N/A 10/08/2015   Procedure: PLEURX CATH REMOVAL;  Surgeon: [name], MD;  Location: [facility] ORS;  Service: Thoracic;  Laterality: N/A;  . CORONARY ANGIOPLASTY WITH STENT PLACEMENT  01/2009; 2012  . FRACTURE SURGERY    .  VIDEO ASSISTED THORACOSCOPY (VATS)/THOROCOTOMY Left 09/29/2015   Procedure: PREOP BRONCHOSCOPY, LEFT THORACOSCOPY, POSSIBLE THORACOTOMY, PLEURAL BIOPSY, TALC;  Surgeon: [name], MD;  Location: [facility] ORS;  Service: General;  Laterality: Left;     Allergies  Allergies  Allergen Reactions  . Azithromycin Rash    History of Present Illness    75 year old female with the above complex past medical  history including CAD, chronic combined systolic and diastolic congestive heart failure, hypertension, hyperlipidemia, ongoing tobacco abuse, COPD, anemia, and narcotic abuse on chronic Suboxone therapy.  Cardiac history dates back to 2010, when she suffered a VF arrest in the setting of inferoposterior MI and required aspiration thrombectomy and bare-metal stenting of the right coronary artery.  She subsequently developed in-stent restenosis and required drug-eluting stent placement to the mid right coronary artery and February 2012, balloon angioplasty for in-stent restenosis in September 2017, and repeat balloon angioplasty in the setting of unstable angina and in-stent restenosis in January 2018.  At that time, she was also noted to have moderate LAD disease and fractional flow reserve was performed and normal.  She has been medically managed since with chronic dual antiplatelet therapy.  Patient lives locally and still works full-time.  She continues to smoke.  Per dtrs, when they last spoke to her earlier in the week, she was doing well.  However, they have since learned that she's been having increased dyspnea, cough, congestion, for about a week, and has been taking an old prednisone rx.  She was reportedly @ work on 4/29, when she went out to smoke a cigarette and became increasingly dyspneic. She returned inside and told colleagues that she couldn't breathe, and EMS was called.  She was placed on CPAP and treated w/ solumedrol and duoneb en route to [facility].  Here, she was afebrile, tachypneic, and tachycardic (sinus).  She was transitioned from CPAP to BiPAP @ 100% with sats 95-100%.  Labs notable for hyperglycemia @ 350, WBC 40k, lactage 2.5, initial HsTrop of 70, and neg COVID swab.  ECG showed sinus tach @ 134, LAE, 16mm ST elev in aVL, old antsept infarct, and upsloping inflat ST dep.  CXR w/ new LLL infiltrate w/o edema.  She was placed on IV abx, nebs, and IV steroids.  HsTrop has since risen (70  745   1838  2434).  F/u ECG yesterday afternoon did show mild ST elev in I and aVL, though pt denied chest pain.  She was placed on heparin.  Early this AM, just after midnight, pt became more agitated and dyspneic.  She was then witnessed to lose consciousness w/ frothy pink sputum and marked HTN - 192/110.  She was subsequently intubated and sedated.  She became hypotensive this AM and was placed on phenylephrine.  She remains intubated and sedated w/ 2 daughters @ bedside.  Inpatient Medications    . aspirin  81 mg Per Tube QHS  . chlorhexidine gluconate (MEDLINE KIT)  15 mL Mouth Rinse BID  . Chlorhexidine Gluconate Cloth  6 each Topical Daily  . clopidogrel  75 mg Per Tube QHS  . docusate  100 mg Per Tube BID  . fentaNYL (SUBLIMAZE) injection  25 mcg Intravenous Once  . fluticasone  1 spray Each Nare Daily  . furosemide  40 mg Intravenous Once  . insulin aspart  0-15 Units Subcutaneous Q4H  . ipratropium-albuterol  3 mL Nebulization Q6H  . levothyroxine  88 mcg Per Tube Q0600  . mouth rinse  15 mL Mouth Rinse 10  times per day  . methylPREDNISolone (SOLU-MEDROL) injection  40 mg Intravenous Q12H  . mupirocin ointment  1 application Nasal BID  . polyethylene glycol  17 g Per Tube Daily  . potassium chloride  20 mEq Per Tube Q4H    Family History-patient is intubated and sedated.  Family history obtained from prior records.    Family History  Problem Relation Age of Onset  . Coronary artery disease Father   . Hyperlipidemia Father   . Early death Father   . Heart attack Father   . Coronary artery disease Mother   . Hyperlipidemia Mother   . Heart attack Mother   . Cancer Brother    She indicated that her mother is deceased. She indicated that her father is deceased. She indicated that her sister is alive. She indicated that the status of her brother is unknown. She indicated that her maternal grandmother is deceased. She indicated that her maternal grandfather is deceased. She  indicated that her paternal grandmother is deceased. She indicated that her paternal grandfather is deceased.   Social History-patient is intubated and sedated.  Social history obtained from prior records.    Social History   Socioeconomic History  . Marital status: Legally Separated    Spouse name: Not on file  . Number of children: Not on file  . Years of education: Not on file  . Highest education level: Not on file  Occupational History  . Not on file  Tobacco Use  . Smoking status: Current Every Day Smoker    Packs/day: 0.50    Years: 56.00    Pack years: 28.00    Types: Cigarettes  . Smokeless tobacco: Never Used  Vaping Use  . Vaping Use: Never used  Substance and Sexual Activity  . Alcohol use: No    Alcohol/week: 0.0 standard drinks  . Drug use: No  . Sexual activity: Not Currently  Other Topics Concern  . Not on file  Social History Narrative  . Not on file   Social Determinants of Health   Financial Resource Strain: Not on file  Food Insecurity: Not on file  Transportation Needs: Not on file  Physical Activity: Not on file  Stress: Not on file  Social Connections: Not on file  Intimate Partner Violence: Not on file     Review of Systems    Patient is currently debated and sedated and unable to provide a complete review of systems.  Physical Exam    Blood pressure 128/69, pulse 98, temperature 98.78 F (37.1 C), resp. rate 12, height $RemoveBe'4\' 11"'hmIbMLReF$  (1.499 m), weight 63.6 kg, SpO2 99 %.  General: Intubated and sedated Psych: Sedated and unresponsive. Neuro: Unresponsive in the setting of sedation. HEENT: Normal  Neck: Supple without bruits or JVD. Lungs:  Resp regular and unlabored, coarse breath sounds bilaterally. Heart: RRR, distant, no s3, s4, or murmurs. Abdomen: Soft, non-tender, non-distended, BS + x 4.  Extremities: No clubbing, cyanosis or edema. DP/PT2+, Radials 2+ and equal bilaterally.  Labs    Cardiac Enzymes Recent Labs  Lab  09/10/20 1417 09/10/20 1600 09/10/20 2050 09/11/20 0437  TROPONINIHS 70* 745* 1,838* 2,434*      Lab Results  Component Value Date   WBC 30.7 (H) 09/11/2020   HGB 14.2 09/11/2020   HCT 44.8 09/11/2020   MCV 97.0 09/11/2020   PLT 324 09/11/2020    Recent Labs  Lab 09/11/20 0437  NA 142  K 3.3*  CL 108  CO2 19*  BUN  26*  CREATININE 0.87  CALCIUM 8.2*  PROT 6.9  BILITOT 0.8  ALKPHOS 97  ALT 58*  AST 42*  GLUCOSE 182*   Lab Results  Component Value Date   CHOL 107 10/02/2019   HDL 41 10/02/2019   LDLCALC 43 10/02/2019   TRIG 127 10/02/2019   Lab Results  Component Value Date   DDIMER 3.47 (H) 09/11/2020     Radiology Studies    DG Chest Portable 1 View  Result Date: 09/11/2020 CLINICAL DATA:  Endotracheal tube and orogastric tube placement. EXAM: PORTABLE CHEST 1 VIEW COMPARISON:  September 10, 2020 FINDINGS: An endotracheal tube is seen with its distal tip approximately 3.9 cm from the carina. A nasogastric tube is noted with its distal tip overlying the expected region of the gastric fundus. Marked severity infiltrate is seen throughout the left lung. This is mildly increased in severity when compared to the prior study. Mild, stable right basilar atelectasis and/or infiltrate is seen. There is no evidence of a pleural effusion or pneumothorax. The heart size and mediastinal contours are within normal limits. There is moderate severity calcification of the aortic arch. The visualized skeletal structures are unremarkable. IMPRESSION: Interval endotracheal tube and nasogastric tube placement positioning, as described above. Electronically Signed   By: Virgina Norfolk M.D.   On: 09/11/2020 02:57   DG Chest Portable 1 View  Result Date: 09/10/2020 CLINICAL DATA:  Respiratory distress. EXAM: PORTABLE CHEST 1 VIEW COMPARISON:  June 04, 2016. FINDINGS: The heart size and mediastinal contours are within normal limits. No pneumothorax or pleural effusion is noted. New large  left lower lobe airspace opacity is noted concerning for pneumonia. Minimal right basilar atelectasis or infiltrate is noted. The visualized skeletal structures are unremarkable. IMPRESSION: New large left lower lobe airspace opacity is noted concerning for pneumonia. Aortic Atherosclerosis (ICD10-I70.0). Electronically Signed   By: Marijo Conception M.D.   On: 09/10/2020 14:51    ECG & Cardiac Imaging    4/29 @ 1418: Sinus tachycardia, 134, LAE, 59mm ST elev aVL, upsloping inflat ST dep, old anteroseptal infarct - personally reviewed. 4/29 @ 1602: RSR, 98, LAE, 44mm ST elev I/aVL w/ inf ST dep. Old anteroseptal infarct - personally reviewed  Assessment & Plan    1.  Left lower lobe pneumonia/acute on chronic respiratory failure/AE COPD: Patient reportedly has been experiencing increasing dyspnea, cough, congestion, for which she has been using an old prednisone prescription at home.  She had acute worsening of dyspnea on April 29 prompting coworkers to call EMS.  She initially required BiPAP and left lower lobe pneumonia was noted on chest x-ray.  She is currently on antibiotics, steroids, nebulizers.  She did require intubation in the setting of hypertensive emergency and acute pulmonary edema around midnight this morning (see below).  Management per critical care.  2.  Non-STEMI/coronary artery disease: Patient with prior history of CAD with multiple intervention to the right coronary artery in the setting of recurrent in-stent restenosis.  Last intervention was in 2018 (PTCA).  She is known to have moderate ostial LAD disease as well.  In the setting of presentation with respiratory failure and pneumonia, she was noted to have a rise in troponin from 70  745  1838  2434.  Upon review of ECGs, ECG at 1602 yesterday afternoon also showed 1 mm ST elevation in 1 and aVL with inferior ST depression and old anteroseptal infarct.  Lateral ST elevation was new compared to prior ECGs.  Per report, patient  was not  having chest pain.  Repeat ECG was that this morning is more similar to ECG at 1418 on 429, with resolution of ST elevation in lead I but persistent ST elevation in aVL only.  Continue to trend troponins to peak.  Agree with heparin as well as continuation of dual antiplatelet therapy.  Home dose of beta-blocker and nitrate currently on hold in the setting of hypotension requiring vasopressor therapy.  Resume statin when taking p.o.'s.  Following recovery from pneumonia, we will plan on diagnostic catheterization.  Discussed with daughters at bedside today.  Echo pending.  3.  Acute on chronic combined systolic diastolic congestive heart failure/acute pulmonary edema: Most recent EF 60-65% with grade 1 diastolic dysfunction in May 2017.  Initial imaging did not show any significant heart failure though just after midnight this morning, patient became unresponsive with marked hypertension and frothy sputum concerning for acute pulmonary edema.  She was treated with Lasix and intubated at that time.  She received an additional dose of IV Lasix this morning.  Follow-up chest x-ray this morning shows mildly regressed widespread patchy airspace and interstitial opacity in the left lung.  Echo pending.  We will hold off on further diuresis at this time.  Blood pressure stable on phenylephrine.  4.  Essential hypertension: Patient with hypotension this morning required initiation of phenylephrine.  Follow.  5.  Hyperlipidemia: On rosuvastatin at home.  LDL 43 last year.  Plan to resume once taking p.o.'s.  6.  Hypokalemia: Supplementation ordered.  7.  Tobacco abuse: Will require cessation counseling.  8.  Chronic narcotic usage: On Suboxone at home.  Currently on fentanyl in setting of sedation.  9.  Hyperglycemia: A1c 6.4 this morning.  Follow while on steroids.  Signed, Murray Hodgkins, NP 09/11/2020, 7:27 AM  For questions or updates, please contact   Please consult [email or web address] for contact info  under Cardiology/STEMI.

## 2020-09-11 NOTE — ED Notes (Signed)
Report called to [facility], RN in ICU, pt to be assigned to room 19; pt output in foley 400 mL

## 2020-09-11 NOTE — H&P (Signed)
NAME:  Kathryn Garrett, MRN:  [id number], DOB:  03/08/1946, LOS: 1 ADMISSION DATE:  09/10/2020, CONSULTATION DATE: 09/11/2020 REFERRING MD: Dr. Damita Dunnings, CHIEF COMPLAINT: Shortness of breath  History of Present Illness:  75 year old female presented to the [facility] ED from home with complaints of shortness of breath.  Patient reported 2 to 3 days of progressive shortness of breath and associated new onset nonproductive cough as well as a subjective fever.  Per ED documentation she denied associated chest pain/palpitations/diaphoresis.  And denies any recent nausea/vomiting, chills/rigors/myalgias.  Patient confirmed a history of COPD and current smoking history stating she is not on any supplemental oxygen at baseline.  ED course: Patient received cefepime due to concerns for a left lower lobe infiltrate, 500 mL NS bolus & IV fluids at 150 mL an hour Initial vitals: Afebrile at 98, tachypneic at 22, tachycardic at 108, BP 127/72 & 99% on BiPAP Significant labs: Serum bicarb 15, hyperglycemic at 350, troponin 70 > 745 > 1838, BNP 222.6, VBG revealed metabolic acidosis: 8.54/62/703/50.0, leukocytosis at 40.2, lactic acidosis 2.5> 2.2 > 2.5  TRH hospitalist were consulted for admission.  Around midnight PCCM was called due to patient's deteriorating respiratory status.  Per ED provider Dr. Karma Greaser and care RN the patient complained that " I can't breathe", was given a dose of Ativan and became somnolent on BiPAP.  Coarse crackles auscultated bilaterally, and the decision was made to emergently intubate the patient and place her on mechanical ventilation. PCCM consulted for management Pertinent  Medical History  CAD status post stent to the RCA with in-stent restenosis in 02/02/2016 & in-stent restenosis in January 2018 CHF COPD Current smoker  Significant Hospital Events: Including procedures, antibiotic start and stop dates in addition to other pertinent events   . 09/10/2020-patient admits admitted with  hospitalist service for COPD exacerbation and left lower lobe pneumonia . 09/11/2020-overnight patient had suspected flash pulmonary edema, dyspnea followed by somnolence requiring emergent intubation and mechanical ventilation.  Admitted to ICU  Interim History / Subjective:  Patient unresponsive on BiPAP, not responsive to painful stimuli.  Dr. Karma Greaser setting up for intubation.  Vital signs include hypertension with SBP in the 190s and tachycardia with heart rate in the 130's  Objective   Blood pressure (!) 149/65, pulse (!) 127, temperature 98 F (36.7 C), temperature source Axillary, resp. rate (!) 23, height 4\' 11"  (1.499 m), weight 68.2 kg, SpO2 100 %.       No intake or output data in the 24 hours ending 09/11/20 0058 Filed Weights   09/10/20 1421  Weight: 68.2 kg    Examination: General: Adult female, critically ill, lying in bed-unresponsive intubation being prepared HEENT: MM pink/moist, anicteric, atraumatic, neck supple Neuro: Unresponsive, unable to follow commands, PERRL +3 CV: s1s2 RRR, sinus tachycardia on monitor, no r/m/g Pulm: Regular, non labored on BiPAP-being transitioned to mechanical ventilation, breath sounds coarse crackles-BUL & diminished-BLL GI: soft, rounded, non tender, bs x 4 Skin: Limited exam- no rashes/lesions noted Extremities: warm/dry, pulses + 2 R/P, trace edema noted BLE  Labs/imaging that I have personally reviewed  (right click and "Reselect all SmartList Selections" daily)  Na+/ K+: 136/3.8 BUN/Cr.:  22/0.89 Serum CO2/ AG: 15/15  Hgb: 13.5 Troponin: 70 > 745 > 1838 BNP: 222.6  WBC/ TMAX: 40.2/afebrile Lactic/ PCT: 2.5 > 2.2 > 2.5/0.12  VBG: 7.11/47/125/14.9 CXR 09/10/2020: New large left lower lobe airspace opacity concerning for pneumonia Resolved Hospital Problem list     Assessment & Plan:  Acute  Hypoxic Respiratory Failure secondary to suspected flash pulmonary edema in the setting of suspected CAP PMHx: COPD, current  smoker - Ventilator settings: PRVC  8 mL/kg, 100% FiO2, 8 PEEP, continue ventilator support & lung protective strategies - Wean PEEP & FiO2 as tolerated, maintain SpO2 > 88% - Head of bed elevated 30 degrees, VAP protocol in place - Plateau pressures less than 30 cm H20  - Intermittent chest x-ray & ABG PRN - Daily WUA with SBT as tolerated  - Ensure adequate pulmonary hygiene  - F/u cultures, trend PCT - Continue CAP Pna coverage cefepime & doxycycline (allergic to azithromycin) - Steroids initiated: solu-medrol 40 mg BID - Budesonide nebs BID, bronchodilators PRN - PAD protocol in place: continue Fentanyl & Propofol   Sepsis without septic shock due to suspected CAP Lactic acidosis Lactic: 2.5 > 2.2 > 2.5, Baseline PCT: 0.12, UA: Borderline with + Nitrites with rare bacteria, CXR: New left lower lobe opacity  Initial interventions/workup included: 500 L of NS followed by continuous IV fluid & Cefepime/ Vancomycin - Supplemental oxygen as needed, to maintain SpO2 > 90% - f/u cultures, trend lactic/ PCT - Daily CBC - monitor WBC/ fever curve - IV antibiotics: cefepime, doxycycline & vancomycin  -Holding further IVF hydration in the setting of acute pulmonary edema - Consider vasopressors to maintain MAP< 65 - Strict I/O's: alert provider if UOP < 0.5 mL/kg/hr  N-STEMI PMHx: CAD status post stent to RCA requiring additional interventions due to restenosis, HFpEF No complaints of chest pain, only dyspnea.  Concern for flash pulmonary edema prior to emergent intubation -Diurese as tolerated - Trend troponins  - Echocardiogram ordered - heparin drip per pharmacy consult - anti platelet: Plavix - continuous cardiac monitoring - Cardiology consulted, appreciate input  Non-anion gap metabolic acidosis of unknown etiology Patient's renal function is normal, with a borderline UA and no signs and symptoms of GI distress. - f/u repeat ABG, if still showing metabolic acidosis consider an  amp of bicarb - Strict I/O's: alert provider if UOP < 0.5 mL/kg/hr - Daily BMP, replace electrolytes PRN -Supportive care  Best practice (right click and "Reselect all SmartList Selections" daily)  Diet:  NPO Pain/Anxiety/Delirium protocol (if indicated): Yes (RASS goal -1) VAP protocol (if indicated): Yes DVT prophylaxis: Systemic AC GI prophylaxis: H2B Glucose control:  SSI Yes Central venous access:  N/A Arterial line:  N/A Foley:  Yes, and it is still needed Mobility:  bed rest  PT consulted: N/A Last date of multidisciplinary goals of care discussion 09/10/20 Code Status:  full code Disposition: ICU  Labs   CBC: Recent Labs  Lab 09/10/20 1417  WBC 40.2*  HGB 13.5  HCT 42.8  MCV 96.2  PLT 429*    Basic Metabolic Panel: Recent Labs  Lab 09/10/20 1417 09/10/20 1743  NA 136  --   K 3.8  --   CL 106  --   CO2 15*  --   GLUCOSE 350*  --   BUN 22  --   CREATININE 0.89  --   CALCIUM 8.8*  --   MG  --  2.4   GFR: Estimated Creatinine Clearance: 45.9 mL/min (by C-G formula based on SCr of 0.89 mg/dL). Recent Labs  Lab 09/10/20 1417 09/10/20 1600 09/10/20 1716 09/10/20 1954 09/10/20 2050  PROCALCITON  --   --   --  0.12  --   WBC 40.2*  --   --   --   --   LATICACIDVEN  --  2.5* 2.2*  --  2.5*    Liver Function Tests: Recent Labs  Lab 09/10/20 1417  AST 37  ALT 42  ALKPHOS 93  BILITOT 0.7  PROT 7.0  ALBUMIN 3.7   No results for input(s): LIPASE, AMYLASE in the last 168 hours. No results for input(s): AMMONIA in the last 168 hours.  ABG    Component Value Date/Time   HCO3 14.9 (L) 09/10/2020 1418   TCO2 23 01/14/2009 0327   ACIDBASEDEF 14.4 (H) 09/10/2020 1418   O2SAT 97.5 09/10/2020 1418     Coagulation Profile: Recent Labs  Lab 09/10/20 1743  INR 1.4*    Cardiac Enzymes: No results for input(s): CKTOTAL, CKMB, CKMBINDEX, TROPONINI in the last 168 hours.  HbA1C: Hgb A1c MFr Bld  Date/Time Value Ref Range Status  10/02/2019  12:28 PM 6.4 (H) 4.8 - 5.6 % Final    Comment:             Prediabetes: 5.7 - 6.4          Diabetes: >6.4          Glycemic control for adults with diabetes: <7.0     CBG: No results for input(s): GLUCAP in the last 168 hours.  Review of Systems:   UTA patient unresponsive -preparing for emergent intubation  Past Medical History:  She,  has a past medical history of Anemia, mild, Arthritis, CAD (coronary artery disease), Cardiac arrest ([place]) (01/2009), Chest pain (06/04/2016), Chronic bronchitis ([place]), Chronic combined systolic and diastolic CHF (congestive heart failure) ([place]), COPD (chronic obstructive pulmonary disease) ([place]), Hyperlipidemia (01/28/2016), Hyperlipidemia LDL goal <70 (01/28/2016), Hypertension, Lung mass, MI (myocardial infarction) ([place]) (01/2009), Narcotic abuse ([place]), Pleural effusion on left (09/24/2015), Pneumonia (06/2014; 09/2015), Pre-diabetes, Pulmonary nodule, PVD (peripheral vascular disease) ([place]), S/P PTCA (percutaneous transluminal coronary angioplasty) 02/10/16 to RCA lesion for in stent restenois (02/11/2016), Subclavian artery stenosis ([place]), Tobacco abuse, and Unstable angina ([place]).   Surgical History:   Past Surgical History:  Procedure Laterality Date  . ABDOMINAL HYSTERECTOMY    . ANKLE FRACTURE SURGERY Right 2000s  . CARDIAC CATHETERIZATION N/A 02/10/2016   Procedure: Left Heart Cath and Coronary Angiography;  Surgeon: Jettie Booze, MD;  Location: [place] [facility];  Service: Cardiovascular;  Laterality: N/A;  . CARDIAC CATHETERIZATION N/A 02/10/2016   Procedure: Coronary Balloon Angioplasty;  Surgeon: Jettie Booze, MD;  Location: [place] [facility];  Service: Cardiovascular;  Laterality: N/A;  instent RCA  . CARDIAC CATHETERIZATION N/A 06/05/2016   Procedure: Left Heart Cath and Coronary Angiography;  Surgeon: Leonie Man, MD;  Location: [place] [facility];  Service: Cardiovascular;  Laterality: N/A;  . CARDIAC CATHETERIZATION N/A  06/05/2016   Procedure: Coronary Balloon Angioplasty;  Surgeon: Leonie Man, MD;  Location: [place] [facility];  Service: Cardiovascular;  Laterality: N/A;  . CHEST TUBE INSERTION N/A 10/08/2015   Procedure: PLEURX CATH REMOVAL;  Surgeon: [name], MD;  Location: [facility];  Service: Thoracic;  Laterality: N/A;  . CORONARY ANGIOPLASTY WITH STENT PLACEMENT  01/2009; 2012  . FRACTURE SURGERY    . VIDEO ASSISTED THORACOSCOPY (VATS)/THOROCOTOMY Left 09/29/2015   Procedure: PREOP BRONCHOSCOPY, LEFT THORACOSCOPY, POSSIBLE THORACOTOMY, PLEURAL BIOPSY, TALC;  Surgeon: [name], MD;  Location: [facility];  Service: General;  Laterality: Left;     Social History:   reports that she has been smoking cigarettes. She has a 28.00 pack-year smoking history. She has never used smokeless tobacco. She reports that she does not drink  alcohol and does not use drugs.   Family History:  Her family history includes Cancer in her brother; Coronary artery disease in her father and mother; Early death in her father; Heart attack in her father and mother; Hyperlipidemia in her father and mother.   Allergies Allergies  Allergen Reactions  . Azithromycin Rash     Home Medications  Prior to Admission medications   Medication Sig Start Date End Date Taking? Authorizing Provider  acetaminophen (TYLENOL) 500 MG tablet Take 1,000 mg by mouth every 6 (six) hours as needed for headache.   Yes [provider]  albuterol (PROVENTIL HFA;VENTOLIN HFA) 108 (90 Base) MCG/ACT inhaler Inhale 2 puffs into the lungs every 4 (four) hours as needed for wheezing or shortness of breath.   Yes [provider]  amoxicillin (AMOXIL) 500 MG tablet Take 500 mg by mouth 3 (three) times daily. 08/03/20  Yes [provider]  aspirin EC 81 MG tablet Take 81 mg by mouth at bedtime.    Yes [provider]  azelastine (ASTELIN) 0.1 % nasal spray USE 2 SPRAYS BID UNTIL DIRECTED TO STOP. USE FOR RUNNY NOSE  08/14/17  Yes [provider]  budesonide-formoterol (SYMBICORT) 160-4.5 MCG/ACT inhaler Inhale 1 puff into the lungs 2 (two) times daily. 03/05/18  Yes Pleas Koch, NP  Buprenorphine HCl-Naloxone HCl 8-2 MG FILM Place 1 Film under the tongue 3 (three) times daily.   Yes [provider]  cetirizine (ZYRTEC) 10 MG tablet Take 1 tablet (10 mg total) by mouth daily. 03/11/18  Yes Alene Mires, Sahar M, PA-C  clopidogrel (PLAVIX) 75 MG tablet TAKE 1 TABLET(75 MG) BY MOUTH AT BEDTIME 12/23/19  Yes Jettie Booze, MD  dexamethasone (DECADRON) 6 MG tablet dexamethasone 6 mg tablet  TAKE 1 TABLET BY MOUTH EVERY DAY   Yes [provider]  doxycycline (VIBRAMYCIN) 100 MG capsule doxycycline hyclate 100 mg capsule  TAKE 1 CAPSULE BY MOUTH TWICE DAILY   Yes [provider]  fluticasone (FLONASE) 50 MCG/ACT nasal spray Place 1 spray into both nostrils daily.   Yes [provider]  furosemide (LASIX) 40 MG tablet Take 1 tablet (40 mg total) by mouth daily as needed (swelling). 01/16/20  Yes Jettie Booze, MD  ibuprofen (ADVIL,MOTRIN) 600 MG tablet Take 600 mg by mouth as needed for pain. 08/14/17  Yes [provider]  isosorbide mononitrate (IMDUR) 30 MG 24 hr tablet Take 1 tablet (30 mg total) by mouth daily. 10/02/19  Yes Jettie Booze, MD  levothyroxine (SYNTHROID) 88 MCG tablet Take 88 mcg by mouth every morning. 05/26/20  Yes [provider]  metoprolol tartrate (LOPRESSOR) 25 MG tablet Take 50 mg by mouth 2 (two) times daily.   Yes [provider]  mupirocin ointment (BACTROBAN) 2 % mupirocin 2 % topical ointment  APPLY TOPICALLY TO LOWER LEG THREE TIMES DAILY FOR 10 DAYS   Yes [provider]  nystatin cream (MYCOSTATIN) Apply topically 2 (two) times daily. 08/11/20  Yes [provider]  potassium chloride SA (KLOR-CON) 20 MEQ tablet Take 1 tablet (20 mEq total) by mouth daily as needed (Take with  furosemide (lasix)). 01/16/20  Yes Jettie Booze, MD  predniSONE (DELTASONE) 20 MG tablet prednisone 20 mg tablet   Yes [provider]  rosuvastatin (CRESTOR) 20 MG tablet TAKE 1 TABLET(20 MG) BY MOUTH DAILY 12/23/19  Yes Jettie Booze, MD  TRELEGY ELLIPTA 100-62.5-25 MCG/INH AEPB Take 1 puff by mouth daily. 08/13/20  Yes [provider]  triamcinolone cream (KENALOG) 0.1 % Apply topically 2 (two) times daily. to affected area 08/11/20  Yes [provider]  nitroGLYCERIN (NITROSTAT) 0.4 MG SL tablet Place 1 tablet (0.4 mg total) under the tongue every 5 (five) minutes as needed for chest pain. 03/12/18   Jettie Booze, MD     Critical care time: 73 minutes    Venetia Night, AGACNP-BC Acute Care Nurse Practitioner [facility]   [phone] / [phone] Please see Amion for pager details.

## 2020-09-11 NOTE — Consult Note (Signed)
[facility] for Heparin Indication: chest pain/ACS  Allergies  Allergen Reactions  . Azithromycin Rash    Patient Measurements: Height: 4\' 11"  (149.9 cm) Weight: 63.6 kg (140 lb 3.4 oz) IBW/kg (Calculated) : 43.2 Heparin Dosing Weight: 57 kg  Vital Signs: Temp: 100.76 F (38.2 C) (04/30 1900) BP: 104/57 (04/30 1900) Pulse Rate: 91 (04/30 1900)  Labs: Recent Labs    09/10/20 1417 09/10/20 1600 09/10/20 1743 09/10/20 2050 09/11/20 0121 09/11/20 0437 09/11/20 1008 09/11/20 1433 09/11/20 1611 09/11/20 1720 09/11/20 2011  HGB 13.5  --   --   --   --  14.2  --   --   --   --   --   HCT 42.8  --   --   --   --  44.8  --   --   --   --   --   PLT 429*  --   --   --   --  324  --   --   --   --   --   APTT  --  28  --   --   --   --   --   --   --   --   --   LABPROT  --   --  17.0*  --   --   --   --   --   --   --   --   INR  --   --  1.4*  --   --   --   --   --   --   --   --   HEPARINUNFRC  --   --   --   --  0.50  --  0.24*  --   --   --  0.55  CREATININE 0.89  --   --   --   --  0.87  --   --   --   --   --   CKMB  --   --   --   --   --   --   --   --   --  26.6*  --   TROPONINIHS 70* 745*  --    < >  --  2,434*  --  2,265* 2,455*  --  2,317*   < > = values in this interval not displayed.    Estimated Creatinine Clearance: 45.3 mL/min (by C-G formula based on SCr of 0.87 mg/dL).   Medical History: Past Medical History:  Diagnosis Date  . Anemia, mild   . Arthritis    "qwhere" (02/10/2016)  . CAD (coronary artery disease)    a. VF arrest 01/2009/CAD with inferoposterior MI s/p aspiration thrombectomy/BMS of RCA at that time. b. ISR of BMS s/p DES to RCA 06/2010 with moderate LM/LAD disease not significant by FFR. c. s/p angiosculpt PTCA to prox RCA 01/2016 for ISR. d. Aggressive PTCA to prox RCA for ISR 05/2016.  . Cardiac arrest ([place]) 01/2009   a. in setting of inf-post STEMI 01/2009 ([facility]).  . Chest pain 06/04/2016  . Chronic  bronchitis ([place])   . Chronic combined systolic and diastolic CHF (congestive heart failure) ([place])    a. EF 40-45% by cath 2010. b. 60-65% with grade 1 DD by echo 09/2015  . COPD (chronic obstructive pulmonary disease) ([place])   . Hyperlipidemia 01/28/2016  . Hyperlipidemia LDL goal <70 01/28/2016  . Hypertension   . Lung  mass   . MI (myocardial infarction) ([place]) 01/2009  . Narcotic abuse ([place])    pt now taking Suboxone tid  . Pleural effusion on left 09/24/2015  . Pneumonia 06/2014; 09/2015  . Pre-diabetes   . Pulmonary nodule   . PVD (peripheral vascular disease) ([facility])    mild atherosclerosis of infrarenal aorta, 25% ostial left renal artery stenosis, 50% ostial right common iliac by cath 2010  . S/P PTCA (percutaneous transluminal coronary angioplasty) 02/10/16 to RCA lesion for in stent restenois 02/11/2016  . Subclavian artery stenosis ([facility])    a. >50% by duplex 05/2016.  . Tobacco abuse   . Unstable angina ([facility])     Medications:  Medications Prior to Admission  Medication Sig Dispense Refill Last Dose  . acetaminophen (TYLENOL) 500 MG tablet Take 1,000 mg by mouth every 6 (six) hours as needed for headache.   09/09/2020 at Unknown time  . albuterol (PROVENTIL HFA;VENTOLIN HFA) 108 (90 Base) MCG/ACT inhaler Inhale 2 puffs into the lungs every 4 (four) hours as needed for wheezing or shortness of breath.   09/10/2020 at Unknown time  . amoxicillin (AMOXIL) 500 MG tablet Take 500 mg by mouth 3 (three) times daily.   09/10/2020 at Unknown time  . aspirin EC 81 MG tablet Take 81 mg by mouth at bedtime.    09/09/2020 at Unknown time  . azelastine (ASTELIN) 0.1 % nasal spray USE 2 SPRAYS BID UNTIL DIRECTED TO STOP. USE FOR RUNNY NOSE  0 09/10/2020 at Unknown time  . budesonide-formoterol (SYMBICORT) 160-4.5 MCG/ACT inhaler Inhale 1 puff into the lungs 2 (two) times daily. 3 Inhaler 0 09/10/2020 at Unknown time  . Buprenorphine HCl-Naloxone HCl 8-2 MG FILM Place 1 Film under the tongue 3 (three) times  daily.   09/10/2020 at Unknown time  . cetirizine (ZYRTEC) 10 MG tablet Take 1 tablet (10 mg total) by mouth daily. 30 tablet 0 Past Week at Unknown time  . clopidogrel (PLAVIX) 75 MG tablet TAKE 1 TABLET(75 MG) BY MOUTH AT BEDTIME 90 tablet 2 09/09/2020 at Unknown time  . dexamethasone (DECADRON) 6 MG tablet dexamethasone 6 mg tablet  TAKE 1 TABLET BY MOUTH EVERY DAY   09/10/2020 at Unknown time  . doxycycline (VIBRAMYCIN) 100 MG capsule doxycycline hyclate 100 mg capsule  TAKE 1 CAPSULE BY MOUTH TWICE DAILY   09/10/2020 at Unknown time  . fluticasone (FLONASE) 50 MCG/ACT nasal spray Place 1 spray into both nostrils daily.   09/10/2020 at Unknown time  . furosemide (LASIX) 40 MG tablet Take 1 tablet (40 mg total) by mouth daily as needed (swelling). 90 tablet 3 09/10/2020 at Unknown time  . ibuprofen (ADVIL,MOTRIN) 600 MG tablet Take 600 mg by mouth as needed for pain.  0 09/10/2020 at Unknown time  . isosorbide mononitrate (IMDUR) 30 MG 24 hr tablet Take 1 tablet (30 mg total) by mouth daily. 90 tablet 3 09/10/2020 at Unknown time  . levothyroxine (SYNTHROID) 88 MCG tablet Take 88 mcg by mouth every morning.   09/10/2020 at Unknown time  . metoprolol tartrate (LOPRESSOR) 25 MG tablet Take 50 mg by mouth 2 (two) times daily.   09/10/2020 at Unknown time  . mupirocin ointment (BACTROBAN) 2 % mupirocin 2 % topical ointment  APPLY TOPICALLY TO LOWER LEG THREE TIMES DAILY FOR 10 DAYS   09/10/2020 at Unknown time  . nystatin cream (MYCOSTATIN) Apply topically 2 (two) times daily.   09/10/2020 at Unknown time  . potassium chloride SA (KLOR-CON) 20 MEQ tablet  Take 1 tablet (20 mEq total) by mouth daily as needed (Take with furosemide (lasix)). 90 tablet 3 09/10/2020 at Unknown time  . predniSONE (DELTASONE) 20 MG tablet prednisone 20 mg tablet   09/10/2020 at Unknown time  . rosuvastatin (CRESTOR) 20 MG tablet TAKE 1 TABLET(20 MG) BY MOUTH DAILY 90 tablet 2 09/09/2020 at Unknown time  . TRELEGY ELLIPTA 100-62.5-25  MCG/INH AEPB Take 1 puff by mouth daily.   09/10/2020 at Unknown time  . triamcinolone cream (KENALOG) 0.1 % Apply topically 2 (two) times daily. to affected area   09/10/2020 at Unknown time  . nitroGLYCERIN (NITROSTAT) 0.4 MG SL tablet Place 1 tablet (0.4 mg total) under the tongue every 5 (five) minutes as needed for chest pain. 25 tablet 4 prn at prn   Scheduled:  . aspirin  81 mg Per Tube QHS  . budesonide (PULMICORT) nebulizer solution  0.25 mg Nebulization BID  . chlorhexidine gluconate (MEDLINE KIT)  15 mL Mouth Rinse BID  . Chlorhexidine Gluconate Cloth  6 each Topical QHS  . clopidogrel  75 mg Per Tube QHS  . docusate  100 mg Per Tube BID  . fentaNYL (SUBLIMAZE) injection  25 mcg Intravenous Once  . fluticasone  1 spray Each Nare Daily  . insulin aspart  0-15 Units Subcutaneous Q4H  . ipratropium-albuterol  3 mL Nebulization Q6H  . levothyroxine  88 mcg Per Tube Q0600  . mouth rinse  15 mL Mouth Rinse 10 times per day  . methylPREDNISolone (SOLU-MEDROL) injection  40 mg Intravenous Q12H  . metoprolol tartrate  2.5 mg Intravenous Q6H  . mupirocin ointment  1 application Nasal BID  . polyethylene glycol  17 g Per Tube Daily   Infusions:  . sodium chloride    . ceFEPime (MAXIPIME) IV Stopped (09/11/20 0537)  . doxycycline (VIBRAMYCIN) IV Stopped (09/11/20 1811)  . famotidine (PEPCID) IV 20 mg (09/11/20 2126)  . fentaNYL infusion INTRAVENOUS 200 mcg/hr (09/11/20 2000)  . heparin 900 Units/hr (09/11/20 2000)  . phenylephrine (NEO-SYNEPHRINE) Adult infusion 50 mcg/min (09/11/20 2125)  . propofol (DIPRIVAN) infusion 50 mcg/kg/min (09/11/20 2000)  . vancomycin Stopped (09/11/20 1944)   PRN: acetaminophen **OR** acetaminophen, docusate sodium, fentaNYL, midazolam, midazolam, polyethylene glycol Anti-infectives (From admission, onward)   Start     Dose/Rate Route Frequency Ordered Stop   09/11/20 1800  vancomycin (VANCOREADY) IVPB 500 mg/100 mL        500 mg 100 mL/hr over 60  Minutes Intravenous Every 24 hours 09/10/20 1754     09/11/20 0600  ceFEPIme (MAXIPIME) 2 g in sodium chloride 0.9 % 100 mL IVPB        2 g 200 mL/hr over 30 Minutes Intravenous Every 12 hours 09/10/20 1754     09/10/20 1900  vancomycin (VANCOREADY) IVPB 1500 mg/300 mL        1,500 mg 150 mL/hr over 120 Minutes Intravenous  Once 09/10/20 1754 09/10/20 2307   09/10/20 1800  doxycycline (VIBRAMYCIN) 100 mg in sodium chloride 0.9 % 250 mL IVPB        100 mg 125 mL/hr over 120 Minutes Intravenous Every 12 hours 09/10/20 1703     09/10/20 1530  ceFEPIme (MAXIPIME) 2 g in sodium chloride 0.9 % 100 mL IVPB        2 g 200 mL/hr over 30 Minutes Intravenous  Once 09/10/20 1516 09/10/20 1633   09/10/20 1530  azithromycin (ZITHROMAX) 500 mg in sodium chloride 0.9 % 250 mL IVPB  Status:  Discontinued  500 mg 250 mL/hr over 60 Minutes Intravenous  Once 09/10/20 1516 09/10/20 1702      Assessment: Pharmacy consulted to start heparin for NSTEMI. Trop 745. No DOAC PTA noted.   4/30:  HL @ 0121 = 0.5, therapeutic x 1 4/30:  HL @ 1008 = 0.24, rate increased to 900 units/hr 4/30:  HL @ 2011 = 0.55, therapeutic x 1  Goal of Therapy:  Heparin level 0.3-0.7 units/ml Monitor platelets by anticoagulation protocol: Yes   Plan:  4/30:  HL @ 2011 = 0.55 is therapeutic x 1 Will continue with current rate of  900 units/hr and recheck HL in 8 hrs for confirmation Daily CBC while on Heparin drip  Paulina Fusi, PharmD, BCPS 09/11/2020 9:48 PM

## 2020-09-11 NOTE — Progress Notes (Signed)
Rt assisted with patient transport from ICU to CT and back to ICU with no complications.

## 2020-09-12 ENCOUNTER — Inpatient Hospital Stay: Payer: Medicare HMO

## 2020-09-12 ENCOUNTER — Encounter: Payer: Self-pay | Admitting: Pulmonary Disease

## 2020-09-12 DIAGNOSIS — I5020 Unspecified systolic (congestive) heart failure: Secondary | ICD-10-CM | POA: Diagnosis not present

## 2020-09-12 DIAGNOSIS — J441 Chronic obstructive pulmonary disease with (acute) exacerbation: Secondary | ICD-10-CM | POA: Diagnosis not present

## 2020-09-12 DIAGNOSIS — I214 Non-ST elevation (NSTEMI) myocardial infarction: Secondary | ICD-10-CM | POA: Diagnosis not present

## 2020-09-12 LAB — BASIC METABOLIC PANEL
Anion gap: 8 (ref 5–15)
BUN: 31 mg/dL — ABNORMAL HIGH (ref 8–23)
CO2: 19 mmol/L — ABNORMAL LOW (ref 22–32)
Calcium: 8.4 mg/dL — ABNORMAL LOW (ref 8.9–10.3)
Chloride: 115 mmol/L — ABNORMAL HIGH (ref 98–111)
Creatinine, Ser: 0.72 mg/dL (ref 0.44–1.00)
GFR, Estimated: >60 mL/min (ref >60)
Glucose, Bld: 156 mg/dL — ABNORMAL HIGH (ref 70–99)
Potassium: 4.3 mmol/L (ref 3.5–5.1)
Sodium: 142 mmol/L (ref 135–145)

## 2020-09-12 LAB — CBC
HCT: 40.7 % (ref 36.0–46.0)
Hemoglobin: 12.6 g/dL (ref 12.0–15.0)
MCH: 30 pg (ref 26.0–34.0)
MCHC: 31 g/dL (ref 30.0–36.0)
MCV: 96.9 fL (ref 80.0–100.0)
Platelets: 353 10*3/uL (ref 150–400)
RBC: 4.2 MIL/uL (ref 3.87–5.11)
RDW: 15.4 % (ref 11.5–15.5)
WBC: 27.8 10*3/uL — ABNORMAL HIGH (ref 4.0–10.5)
nRBC: 0 % (ref 0.0–0.2)

## 2020-09-12 LAB — GLUCOSE, CAPILLARY
Glucose-Capillary: 131 mg/dL — ABNORMAL HIGH (ref 70–99)
Glucose-Capillary: 133 mg/dL — ABNORMAL HIGH (ref 70–99)
Glucose-Capillary: 139 mg/dL — ABNORMAL HIGH (ref 70–99)
Glucose-Capillary: 140 mg/dL — ABNORMAL HIGH (ref 70–99)
Glucose-Capillary: 142 mg/dL — ABNORMAL HIGH (ref 70–99)
Glucose-Capillary: 145 mg/dL — ABNORMAL HIGH (ref 70–99)
Glucose-Capillary: 153 mg/dL — ABNORMAL HIGH (ref 70–99)
Glucose-Capillary: 199 mg/dL — ABNORMAL HIGH (ref 70–99)
Glucose-Capillary: 200 mg/dL — ABNORMAL HIGH (ref 70–99)
Glucose-Capillary: 234 mg/dL — ABNORMAL HIGH (ref 70–99)
Glucose-Capillary: 86 mg/dL (ref 70–99)

## 2020-09-12 LAB — LACTIC ACID, PLASMA
Lactic Acid, Venous: 1.6 mmol/L (ref 0.5–1.9)
Lactic Acid, Venous: 1.3 mmol/L (ref 0.5–1.9)

## 2020-09-12 LAB — STREP PNEUMONIAE URINARY ANTIGEN: Strep Pneumo Urinary Antigen: NEGATIVE

## 2020-09-12 LAB — BRAIN NATRIURETIC PEPTIDE: B Natriuretic Peptide: 970.2 pg/mL — ABNORMAL HIGH (ref 0.0–100.0)

## 2020-09-12 LAB — MAGNESIUM: Magnesium: 2.1 mg/dL (ref 1.7–2.4)

## 2020-09-12 LAB — PHOSPHORUS: Phosphorus: 3.9 mg/dL (ref 2.5–4.6)

## 2020-09-12 LAB — PROCALCITONIN: Procalcitonin: 0.74 ng/mL

## 2020-09-12 LAB — HEPARIN LEVEL (UNFRACTIONATED): Heparin Unfractionated: 0.62 IU/mL (ref 0.30–0.70)

## 2020-09-12 LAB — TRIGLYCERIDES: Triglycerides: 179 mg/dL — ABNORMAL HIGH (ref <150)

## 2020-09-12 IMAGING — DX DG CHEST 1V PORT
2 series · 2 of 2 positions shown · non-contrast
Comparison: Radiograph and CT [DATE]

CLINICAL DATA: Acute respiratory failure, intubation

EXAM:
PORTABLE CHEST 1 VIEW

[chest ap (1 of 2)]
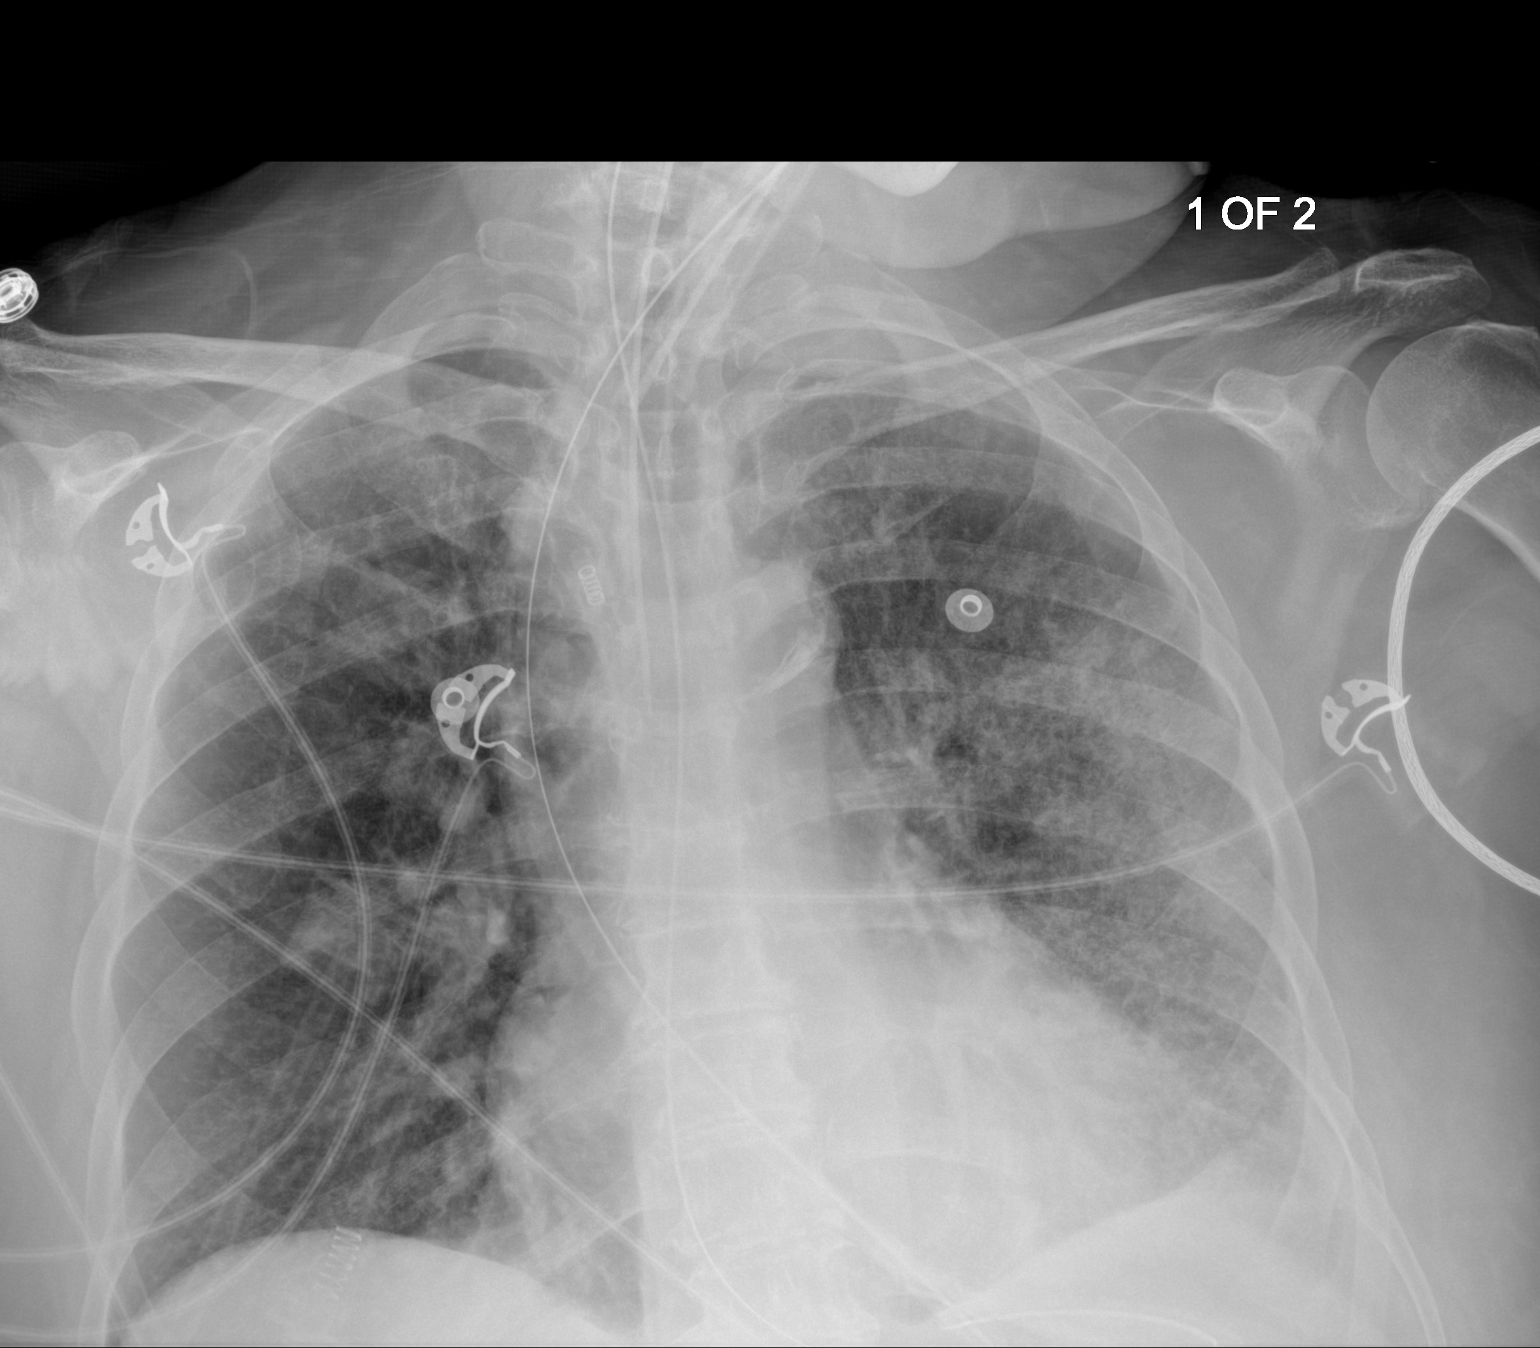

[chest ap (2 of 2)]
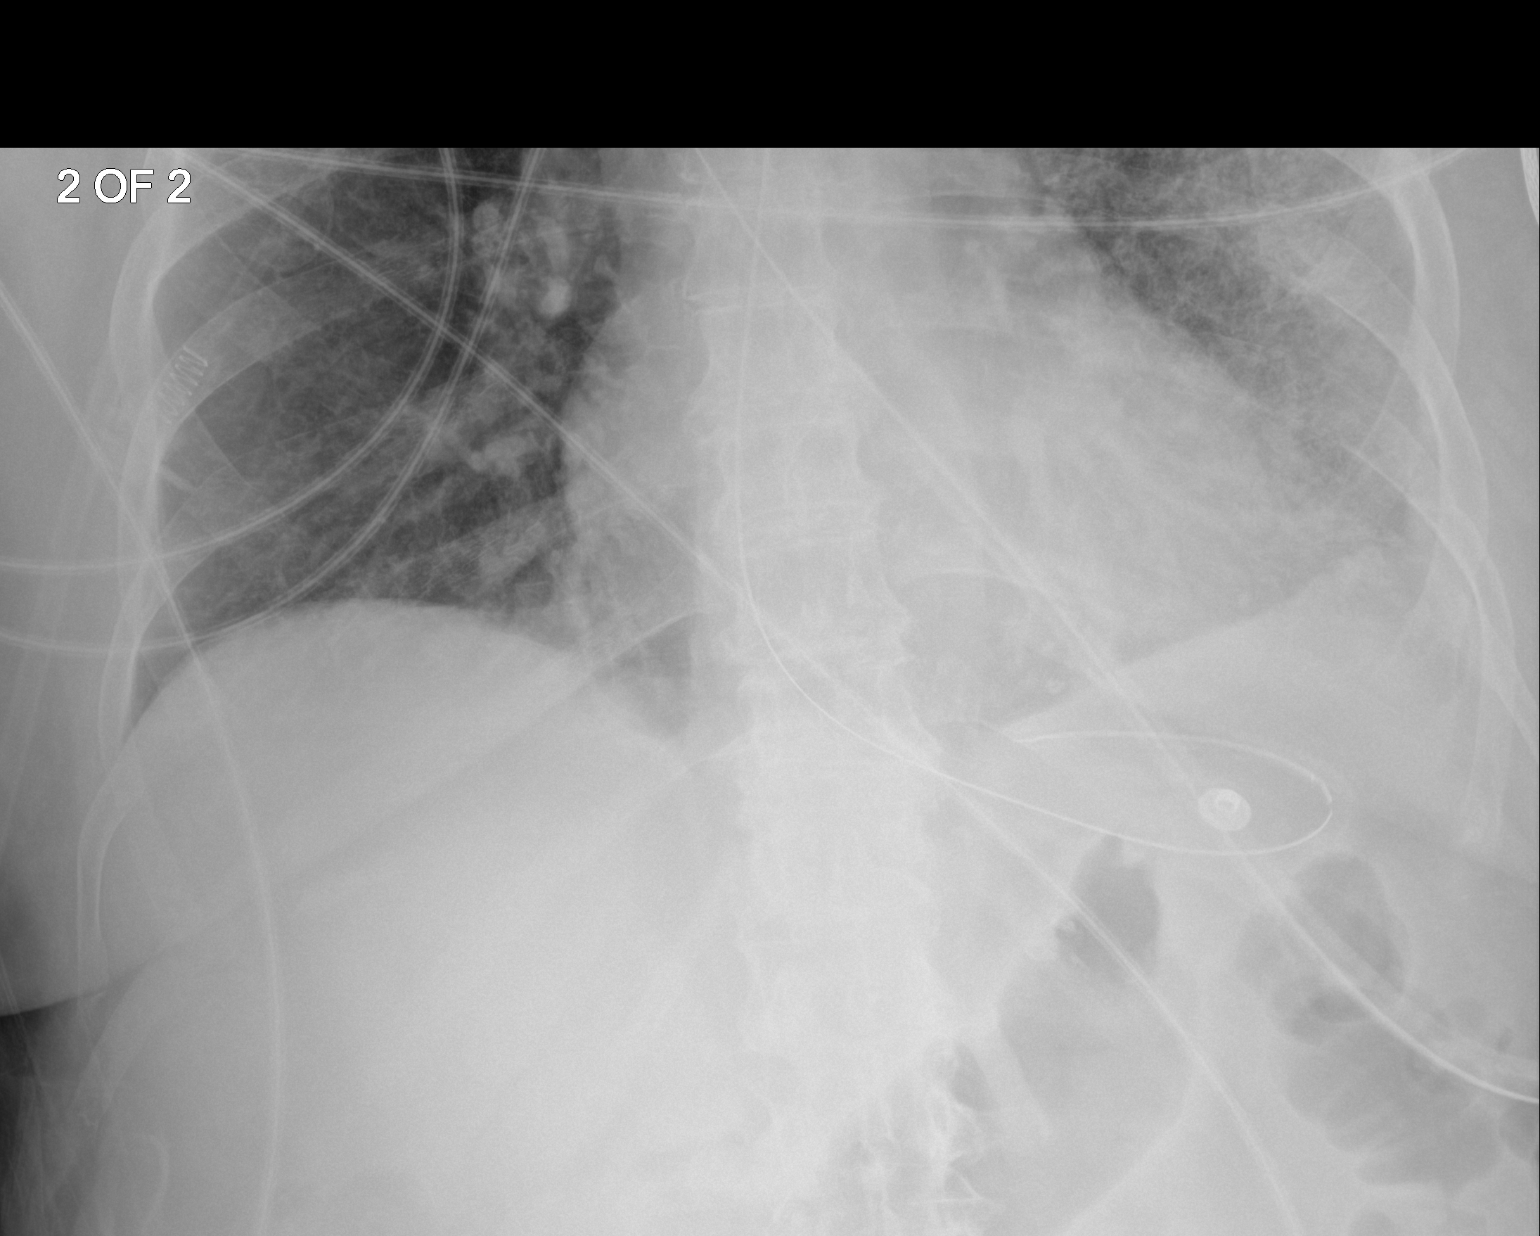

[2 of 2 positions shown; findings below may reference images not displayed]

FINDINGS: *Low positioning of the endotracheal tube 1.5 cm from the carina.
*Transesophageal tube tip and side port distal to the GE junction,
coiled in the left upper quadrant.
*Telemetry leads and external support devices overlie the chest.

Persistent multifocal patchy and interstitial opacities are present
throughout both lungs, most pronounced within the left hemithorax
though not significantly changed from recent comparison radiograph
and CT. Small left effusion. Cardiomediastinal contours are
unchanged from prior with a calcified aorta. No acute osseous or
soft tissue abnormality.
IMPRESSION: Persistent widespread pulmonary opacity in both lungs, left greater
than right.

Low positioning of the endotracheal tube 1.5 cm from the carina,
consider retraction 2-3 cm to the mid trachea.

Additional support devices as above.

Aortic Atherosclerosis ([ID]-[ID]).

These results will be called to the ordering clinician or
representative by the Radiologist Assistant, and communication
documented in the PACS or [REDACTED].

## 2020-09-12 IMAGING — DX DG CHEST 1V PORT
1 series · 1 of 1 positions shown · non-contrast
Comparison: [DATE] at 4 a.m.

CLINICAL DATA: Central line placement

EXAM:
PORTABLE CHEST 1 VIEW

[chest ap]
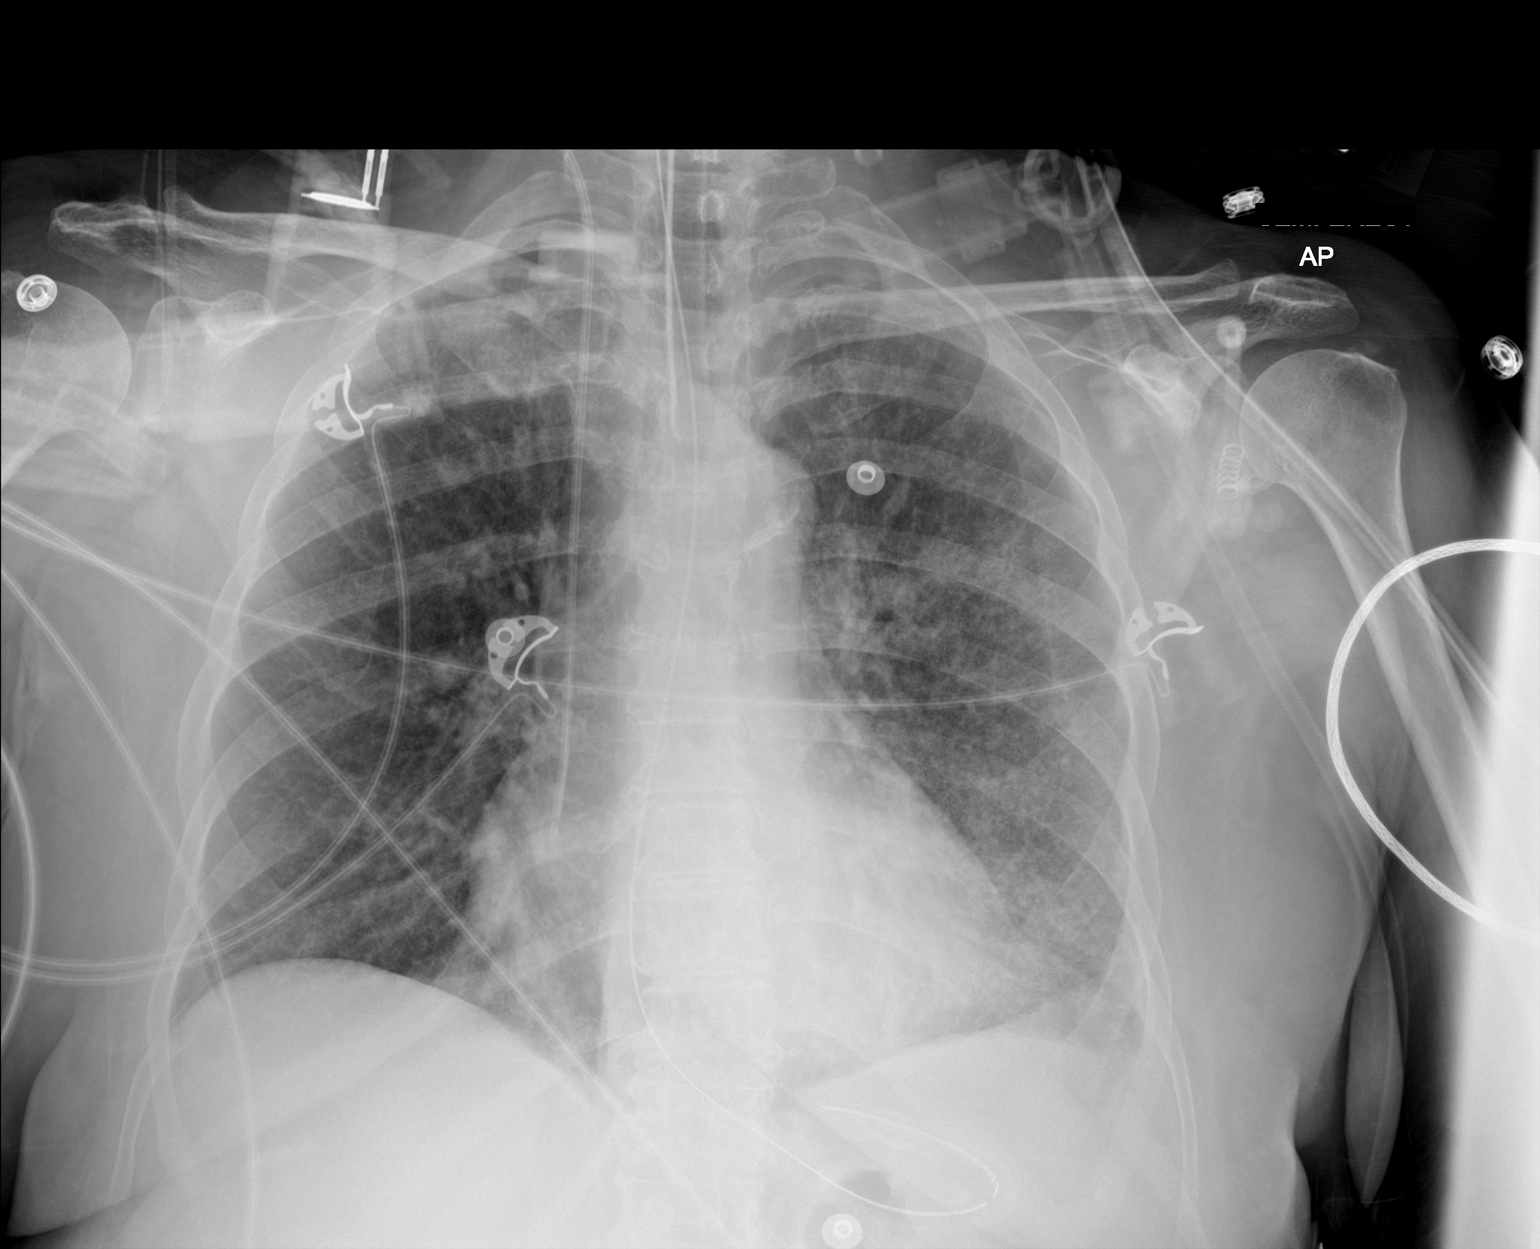

[1 of 1 positions shown; findings below may reference images not displayed]

FINDINGS: Single frontal view of the chest demonstrates interval placement of
a right internal jugular catheter tip overlying the atriocaval
junction. Endotracheal tube overlies tracheal air column tip midway
between thoracic inlet and carina. Enteric catheter tip projects
over the gastric fundus. Cardiac silhouette is stable. Continued
left greater than right ground-glass airspace disease. Small left
effusion again noted. No pneumothorax.
IMPRESSION: 1. No complication after central venous catheter placement.
2. Bilateral left greater than right ground-glass airspace disease
worrisome for multifocal pneumonia.
3. Stable trace left effusion.

## 2020-09-12 MED ORDER — METHYLPREDNISOLONE SODIUM SUCC 40 MG IJ SOLR
40.0000 mg | Freq: Every day | INTRAMUSCULAR | Status: DC
  Administered 2020-09-13 – 2020-09-17 (×5): 40 mg via INTRAVENOUS
  Filled 2020-09-12 (×5): qty 1

## 2020-09-12 MED ORDER — FUROSEMIDE 10 MG/ML IJ SOLN
40.0000 mg | Freq: Two times a day (BID) | INTRAMUSCULAR | Status: DC
  Administered 2020-09-12: 40 mg via INTRAVENOUS
  Filled 2020-09-12: qty 4

## 2020-09-12 MED ORDER — FUROSEMIDE 10 MG/ML IJ SOLN: 20.0000 mg | Freq: Every day | INTRAMUSCULAR | Status: DC

## 2020-09-12 MED ORDER — MORPHINE SULFATE (PF) 2 MG/ML IV SOLN: 2.0000 mg | INTRAVENOUS | Status: DC | PRN

## 2020-09-12 MED ORDER — METOPROLOL TARTRATE 5 MG/5ML IV SOLN
2.5000 mg | INTRAVENOUS | Status: DC | PRN
  Administered 2020-09-16: 2.5 mg via INTRAVENOUS
  Filled 2020-09-12 (×3): qty 5

## 2020-09-12 MED ORDER — MILRINONE LACTATE IN DEXTROSE 20-5 MG/100ML-% IV SOLN
0.1250 ug/kg/min | INTRAVENOUS | Status: DC
  Administered 2020-09-12 – 2020-09-13 (×2): 0.25 ug/kg/min via INTRAVENOUS
  Filled 2020-09-12 (×3): qty 100

## 2020-09-12 NOTE — Progress Notes (Signed)
Family at bedside (Amy and Amanda-daughters) and noted that pt's blood pressure on the softer side (84/44 MAP 56). Pressures had been slightly higher prior to this particular measurement. RN advised Amy that this could be for several reasons to include medications, positioning of cuff, or other reasons. Assured her that investigation would be done to determine the cause and that NP would be notified. Daughters left to go home. BP cuff readjusted and recycled with BP coming to 106/53 MAP 68. NP made aware of BP status and had no further orders but stated that if pt needed to be returned to pressors, that could be completed. Will continue to monitor BP closely and take necessary measures at they arise.

## 2020-09-12 NOTE — Plan of Care (Signed)
  Problem: Activity: Goal: Risk for activity intolerance will decrease Outcome: Progressing   Problem: Pain Managment: Goal: General experience of comfort will improve Outcome: Progressing   Problem: Safety: Goal: Ability to remain free from injury will improve Outcome: Progressing   Problem: Skin Integrity: Goal: Risk for impaired skin integrity will decrease Outcome: Progressing   Problem: Nutrition: Goal: Adequate nutrition will be maintained Outcome: Not Progressing

## 2020-09-12 NOTE — Consult Note (Signed)
[facility] for Heparin Indication: chest pain/ACS  Allergies  Allergen Reactions  . Azithromycin Rash    Patient Measurements: Height: 4\' 11"  (149.9 cm) Weight: 63.3 kg (139 lb 8.8 oz) IBW/kg (Calculated) : 43.2 Heparin Dosing Weight: 57 kg  Vital Signs: Temp: 98.42 F (36.9 C) (05/01 0500) Temp Source: Bladder (05/01 0000) BP: 125/63 (05/01 0500) Pulse Rate: 71 (05/01 0500)  Labs: Recent Labs    09/10/20 1417 09/10/20 1600 09/10/20 1743 09/10/20 2050 09/11/20 0437 09/11/20 1008 09/11/20 1433 09/11/20 1611 09/11/20 1720 09/11/20 2011 09/12/20 0441  HGB 13.5  --   --   --  14.2  --   --   --   --   --  12.6  HCT 42.8  --   --   --  44.8  --   --   --   --   --  40.7  PLT 429*  --   --   --  324  --   --   --   --   --  353  APTT  --  28  --   --   --   --   --   --   --   --   --   LABPROT  --   --  17.0*  --   --   --   --   --   --   --   --   INR  --   --  1.4*  --   --   --   --   --   --   --   --   HEPARINUNFRC  --   --   --    < >  --  0.24*  --   --   --  0.55 0.62  CREATININE 0.89  --   --   --  0.87  --   --   --   --   --  0.72  CKMB  --   --   --   --   --   --   --   --  26.6*  --   --   TROPONINIHS 70* 745*  --    < > 2,434*  --  2,265* 2,455*  --  2,317*  --    < > = values in this interval not displayed.    Estimated Creatinine Clearance: 49.1 mL/min (by C-G formula based on SCr of 0.72 mg/dL).   Medical History: Past Medical History:  Diagnosis Date  . Anemia, mild   . Arthritis    "qwhere" (02/10/2016)  . CAD (coronary artery disease)    a. VF arrest 01/2009/CAD with inferoposterior MI s/p aspiration thrombectomy/BMS of RCA at that time. b. ISR of BMS s/p DES to RCA 06/2010 with moderate LM/LAD disease not significant by FFR. c. s/p angiosculpt PTCA to prox RCA 01/2016 for ISR. d. Aggressive PTCA to prox RCA for ISR 05/2016.  . Cardiac arrest ([place]) 01/2009   a. in setting of inf-post STEMI 01/2009 (VF).  .  Chest pain 06/04/2016  . Chronic bronchitis ([place])   . Chronic combined systolic and diastolic CHF (congestive heart failure) ([place])    a. EF 40-45% by cath 2010. b. 60-65% with grade 1 DD by echo 09/2015  . COPD (chronic obstructive pulmonary disease) ([place])   . Hyperlipidemia 01/28/2016  . Hyperlipidemia LDL goal <70 01/28/2016  . Hypertension   . Lung mass   .  MI (myocardial infarction) ([place]) 01/2009  . Narcotic abuse ([place])    pt now taking Suboxone tid  . Pleural effusion on left 09/24/2015  . Pneumonia 06/2014; 09/2015  . Pre-diabetes   . Pulmonary nodule   . PVD (peripheral vascular disease) ([facility])    mild atherosclerosis of infrarenal aorta, 25% ostial left renal artery stenosis, 50% ostial right common iliac by cath 2010  . S/P PTCA (percutaneous transluminal coronary angioplasty) 02/10/16 to RCA lesion for in stent restenois 02/11/2016  . Subclavian artery stenosis ([facility])    a. >50% by duplex 05/2016.  . Tobacco abuse   . Unstable angina ([facility])     Medications:  Medications Prior to Admission  Medication Sig Dispense Refill Last Dose  . acetaminophen (TYLENOL) 500 MG tablet Take 1,000 mg by mouth every 6 (six) hours as needed for headache.   09/09/2020 at Unknown time  . albuterol (PROVENTIL HFA;VENTOLIN HFA) 108 (90 Base) MCG/ACT inhaler Inhale 2 puffs into the lungs every 4 (four) hours as needed for wheezing or shortness of breath.   09/10/2020 at Unknown time  . amoxicillin (AMOXIL) 500 MG tablet Take 500 mg by mouth 3 (three) times daily.   09/10/2020 at Unknown time  . aspirin EC 81 MG tablet Take 81 mg by mouth at bedtime.    09/09/2020 at Unknown time  . azelastine (ASTELIN) 0.1 % nasal spray USE 2 SPRAYS BID UNTIL DIRECTED TO STOP. USE FOR RUNNY NOSE  0 09/10/2020 at Unknown time  . budesonide-formoterol (SYMBICORT) 160-4.5 MCG/ACT inhaler Inhale 1 puff into the lungs 2 (two) times daily. 3 Inhaler 0 09/10/2020 at Unknown time  . Buprenorphine HCl-Naloxone HCl 8-2 MG FILM Place 1 Film  under the tongue 3 (three) times daily.   09/10/2020 at Unknown time  . cetirizine (ZYRTEC) 10 MG tablet Take 1 tablet (10 mg total) by mouth daily. 30 tablet 0 Past Week at Unknown time  . clopidogrel (PLAVIX) 75 MG tablet TAKE 1 TABLET(75 MG) BY MOUTH AT BEDTIME 90 tablet 2 09/09/2020 at Unknown time  . dexamethasone (DECADRON) 6 MG tablet dexamethasone 6 mg tablet  TAKE 1 TABLET BY MOUTH EVERY DAY   09/10/2020 at Unknown time  . doxycycline (VIBRAMYCIN) 100 MG capsule doxycycline hyclate 100 mg capsule  TAKE 1 CAPSULE BY MOUTH TWICE DAILY   09/10/2020 at Unknown time  . fluticasone (FLONASE) 50 MCG/ACT nasal spray Place 1 spray into both nostrils daily.   09/10/2020 at Unknown time  . furosemide (LASIX) 40 MG tablet Take 1 tablet (40 mg total) by mouth daily as needed (swelling). 90 tablet 3 09/10/2020 at Unknown time  . ibuprofen (ADVIL,MOTRIN) 600 MG tablet Take 600 mg by mouth as needed for pain.  0 09/10/2020 at Unknown time  . isosorbide mononitrate (IMDUR) 30 MG 24 hr tablet Take 1 tablet (30 mg total) by mouth daily. 90 tablet 3 09/10/2020 at Unknown time  . levothyroxine (SYNTHROID) 88 MCG tablet Take 88 mcg by mouth every morning.   09/10/2020 at Unknown time  . metoprolol tartrate (LOPRESSOR) 25 MG tablet Take 50 mg by mouth 2 (two) times daily.   09/10/2020 at Unknown time  . mupirocin ointment (BACTROBAN) 2 % mupirocin 2 % topical ointment  APPLY TOPICALLY TO LOWER LEG THREE TIMES DAILY FOR 10 DAYS   09/10/2020 at Unknown time  . nystatin cream (MYCOSTATIN) Apply topically 2 (two) times daily.   09/10/2020 at Unknown time  . potassium chloride SA (KLOR-CON) 20 MEQ tablet Take 1 tablet (20  mEq total) by mouth daily as needed (Take with furosemide (lasix)). 90 tablet 3 09/10/2020 at Unknown time  . predniSONE (DELTASONE) 20 MG tablet prednisone 20 mg tablet   09/10/2020 at Unknown time  . rosuvastatin (CRESTOR) 20 MG tablet TAKE 1 TABLET(20 MG) BY MOUTH DAILY 90 tablet 2 09/09/2020 at Unknown time   . TRELEGY ELLIPTA 100-62.5-25 MCG/INH AEPB Take 1 puff by mouth daily.   09/10/2020 at Unknown time  . triamcinolone cream (KENALOG) 0.1 % Apply topically 2 (two) times daily. to affected area   09/10/2020 at Unknown time  . nitroGLYCERIN (NITROSTAT) 0.4 MG SL tablet Place 1 tablet (0.4 mg total) under the tongue every 5 (five) minutes as needed for chest pain. 25 tablet 4 prn at prn   Scheduled:  . aspirin  81 mg Per Tube QHS  . budesonide (PULMICORT) nebulizer solution  0.25 mg Nebulization BID  . chlorhexidine gluconate (MEDLINE KIT)  15 mL Mouth Rinse BID  . Chlorhexidine Gluconate Cloth  6 each Topical QHS  . clopidogrel  75 mg Per Tube QHS  . docusate  100 mg Per Tube BID  . fentaNYL (SUBLIMAZE) injection  25 mcg Intravenous Once  . fluticasone  1 spray Each Nare Daily  . insulin aspart  0-15 Units Subcutaneous Q4H  . ipratropium-albuterol  3 mL Nebulization Q6H  . levothyroxine  88 mcg Per Tube Q0600  . mouth rinse  15 mL Mouth Rinse 10 times per day  . methylPREDNISolone (SOLU-MEDROL) injection  40 mg Intravenous Q12H  . metoprolol tartrate  2.5 mg Intravenous Q6H  . mupirocin ointment  1 application Nasal BID  . polyethylene glycol  17 g Per Tube Daily   Infusions:  . sodium chloride    . ceFEPime (MAXIPIME) IV Stopped (09/11/20 2231)  . doxycycline (VIBRAMYCIN) IV 100 mg (09/12/20 0506)  . famotidine (PEPCID) IV Stopped (09/11/20 2156)  . fentaNYL infusion INTRAVENOUS 200 mcg/hr (09/12/20 0506)  . heparin 900 Units/hr (09/12/20 0506)  . phenylephrine (NEO-SYNEPHRINE) Adult infusion 50 mcg/min (09/12/20 0506)  . propofol (DIPRIVAN) infusion 50 mcg/kg/min (09/12/20 0506)  . vancomycin Stopped (09/11/20 1944)   PRN: acetaminophen **OR** acetaminophen, docusate sodium, fentaNYL, midazolam, midazolam, polyethylene glycol Anti-infectives (From admission, onward)   Start     Dose/Rate Route Frequency Ordered Stop   09/11/20 1800  vancomycin (VANCOREADY) IVPB 500 mg/100 mL         500 mg 100 mL/hr over 60 Minutes Intravenous Every 24 hours 09/10/20 1754     09/11/20 0600  ceFEPIme (MAXIPIME) 2 g in sodium chloride 0.9 % 100 mL IVPB        2 g 200 mL/hr over 30 Minutes Intravenous Every 12 hours 09/10/20 1754     09/10/20 1900  vancomycin (VANCOREADY) IVPB 1500 mg/300 mL        1,500 mg 150 mL/hr over 120 Minutes Intravenous  Once 09/10/20 1754 09/10/20 2307   09/10/20 1800  doxycycline (VIBRAMYCIN) 100 mg in sodium chloride 0.9 % 250 mL IVPB        100 mg 125 mL/hr over 120 Minutes Intravenous Every 12 hours 09/10/20 1703     09/10/20 1530  ceFEPIme (MAXIPIME) 2 g in sodium chloride 0.9 % 100 mL IVPB        2 g 200 mL/hr over 30 Minutes Intravenous  Once 09/10/20 1516 09/10/20 1633   09/10/20 1530  azithromycin (ZITHROMAX) 500 mg in sodium chloride 0.9 % 250 mL IVPB  Status:  Discontinued  500 mg 250 mL/hr over 60 Minutes Intravenous  Once 09/10/20 1516 09/10/20 1702      Assessment: Pharmacy consulted to start heparin for NSTEMI. Trop 745. No DOAC PTA noted.   4/30:  HL @ 0121 = 0.5, therapeutic x 1 4/30:  HL @ 1008 = 0.24, rate increased to 900 units/hr 4/30:  HL @ 2011 = 0.55, therapeutic x 1 5/01:  HL @ 0441 = 0.62, therapeutic X 2   Goal of Therapy:  Heparin level 0.3-0.7 units/ml Monitor platelets by anticoagulation protocol: Yes   Plan:  5/1:  HL @ 0441 = 0.62 Will continue pt on current rate and recheck HL on 5/2 with AM labs.   Kathyjo Briere D 09/12/2020 5:52 AM

## 2020-09-12 NOTE — Progress Notes (Addendum)
Progress Note  Patient Name: Kathryn Garrett Date of Encounter: 09/12/2020  Primary Cardiologist: Larae Grooms, MD  Subjective   Remains intubated and sedated.  Patient became tachycardic with attempts to wake her up earlier.  Sedation reapplied.  Bloody secretions noted in suction equipment.  Heparin discontinued.  Inpatient Medications    Scheduled Meds: . aspirin  81 mg Per Tube QHS  . budesonide (PULMICORT) nebulizer solution  0.25 mg Nebulization BID  . chlorhexidine gluconate (MEDLINE KIT)  15 mL Mouth Rinse BID  . Chlorhexidine Gluconate Cloth  6 each Topical QHS  . clopidogrel  75 mg Per Tube QHS  . docusate  100 mg Per Tube BID  . fentaNYL (SUBLIMAZE) injection  25 mcg Intravenous Once  . fluticasone  1 spray Each Nare Daily  . insulin aspart  0-15 Units Subcutaneous Q4H  . ipratropium-albuterol  3 mL Nebulization Q6H  . levothyroxine  88 mcg Per Tube Q0600  . mouth rinse  15 mL Mouth Rinse 10 times per day  . [START ON 09/13/2020] methylPREDNISolone (SOLU-MEDROL) injection  40 mg Intravenous Daily  . mupirocin ointment  1 application Nasal BID  . polyethylene glycol  17 g Per Tube Daily   Continuous Infusions: . sodium chloride    . ceFEPime (MAXIPIME) IV 2 g (09/12/20 1100)  . doxycycline (VIBRAMYCIN) IV 125 mL/hr at 09/12/20 0600  . famotidine (PEPCID) IV 20 mg (09/12/20 0928)  . fentaNYL infusion INTRAVENOUS 125 mcg/hr (09/12/20 0936)  . milrinone    . phenylephrine (NEO-SYNEPHRINE) Adult infusion Stopped (09/12/20 1015)  . propofol (DIPRIVAN) infusion 20 mcg/kg/min (09/12/20 1022)  . vancomycin Stopped (09/11/20 1944)   PRN Meds:.acetaminophen **OR** acetaminophen, docusate sodium, fentaNYL, metoprolol tartrate, midazolam, midazolam, morphine injection, polyethylene glycol    Vital Signs    Vitals:   09/12/20 1315 09/12/20 1330 09/12/20 1345 09/12/20 1400  BP: (!) 96/52 (!) 89/51 (!) 95/57 (!) 96/53  Pulse: 81 74 75 74  Resp: _0 Temp:  (!) 97.34 F (36.3 C) (!) 97.16 F (36.2 C) (!) 97.16 F (36.2 C) (!) 97.16 F (36.2 C)  TempSrc:      SpO2: 97% 97% 98% 97%  Weight:      Height:        Intake/Output Summary (Last 24 hours) at 09/12/2020 1422 Last data filed at 09/12/2020 1025 Gross per 24 hour  Intake 3543.45 ml  Output 1450 ml  Net 2093.45 ml   Filed Weights   09/10/20 1421 09/11/20 0346 09/12/20 0233  Weight: 68.2 kg 63.6 kg 63.3 kg    Physical Exam   GEN: Ill-appearing.  Intubated.  Sedated. HEENT: Grossly normal.  Neck: Supple, moderately elevated JVP. Cardiac: RRR, distant, no murmurs, rubs, or gallops. No clubbing, cyanosis, edema.  Radials 2+, DP/PT 2+ and equal bilaterally.  Respiratory: Intubated.  Respirations regular-ventilated.  Coarse breath sound bilaterally.  Bloody secretions noted and suctioning equipment. GI: Soft, nontender, nondistended, BS + x 4. MS: no deformity or atrophy. Skin: warm and dry, no rash. Neuro: Sedated. Psych: Sedated.  Labs    Chemistry Recent Labs  Lab 09/10/20 1417 09/11/20 0437 09/11/20 1720 09/12/20 0441  NA 136 142  --  142  K 3.8 3.3*  --  4.3  CL 106 108  --  115*  CO2 15* 19*  --  19*  GLUCOSE 350* 182*  --  156*  BUN 22 26*  --  31*  CREATININE 0.89 0.87  --  0.72  CALCIUM  8.8* 8.2*  --  8.4*  PROT 7.0 6.9  --   --   ALBUMIN 3.7 3.4*  --   --   AST 37 42* 23  --   ALT 42 58*  --   --   ALKPHOS 93 97  --   --   BILITOT 0.7 0.8  --   --   GFRNONAA >60 >60  --  >60  ANIONGAP 15 15  --  8     Hematology Recent Labs  Lab 09/10/20 1417 09/11/20 0437 09/12/20 0441  WBC 40.2* 30.7* 27.8*  RBC 4.45 4.62 4.20  HGB 13.5 14.2 12.6  HCT 42.8 44.8 40.7  MCV 96.2 97.0 96.9  MCH 30.3 30.7 30.0  MCHC 31.5 31.7 31.0  RDW 14.2 14.6 15.4  PLT 429* 324 353    Cardiac Enzymes  Recent Labs  Lab 09/10/20 2050 09/11/20 0437 09/11/20 1433 09/11/20 1611 09/11/20 2011  TROPONINIHS 1,838* 2,434* 2,265* 2,455* 2,317*      BNP Recent Labs  Lab  09/11/20 0437 09/11/20 1720 09/12/20 0441  BNP 1,009.8* 1,298.4* 970.2*     DDimer  Recent Labs  Lab 09/11/20 0437  DDIMER 3.47*     Lipids  Lab Results  Component Value Date   CHOL 107 10/02/2019   HDL 41 10/02/2019   LDLCALC 43 10/02/2019   TRIG 179 (H) 09/12/2020   CHOLHDL 2.6 10/02/2019    HbA1c  Lab Results  Component Value Date   HGBA1C 6.4 (H) 09/11/2020    Radiology    CT HEAD WO CONTRAST  Result Date: 09/11/2020 CLINICAL DATA:  75 year old female with unexplained altered mental status. Intubated and unresponsive. EXAM: CT HEAD WITHOUT CONTRAST TECHNIQUE: Contiguous axial images were obtained from the base of the skull through the vertex without intravenous contrast. COMPARISON:  None. FINDINGS: Brain: No midline shift, ventriculomegaly, mass effect, evidence of mass lesion, intracranial hemorrhage or evidence of cortically based acute infarction. Gray-white matter differentiation appears symmetric and within normal limits for age. No cortical encephalomalacia identified. Mild dystrophic calcification in the basal ganglia. Vascular: Calcified atherosclerosis at the skull base. No suspicious intracranial vascular hyperdensity. Skull: Negative. Sinuses/Orbits: Frontoethmoidal sinus osteomas appear inconsequential. Visualized paranasal sinuses and mastoids are clear. Other: No acute orbit or scalp soft tissue finding. Chronic postoperative changes to the globes. IMPRESSION: Negative for age noncontrast CT appearance of the brain. No acute intracranial abnormality identified. Electronically Signed   By: Genevie Ann M.D.   On: 09/11/2020 09:20   CT ANGIO CHEST PE W OR WO CONTRAST  Result Date: 09/11/2020 CLINICAL DATA:  75 year old female with unexplained altered mental status. Intubated and unresponsive. EXAM: CT ANGIOGRAPHY CHEST WITH CONTRAST TECHNIQUE: Multidetector CT imaging of the chest was performed using the standard protocol during bolus administration of intravenous  contrast. Multiplanar CT image reconstructions and MIPs were obtained to evaluate the vascular anatomy. CONTRAST:  90m OMNIPAQUE IOHEXOL 350 MG/ML SOLN COMPARISON:  Portable chest 0706 hours today.  Chest CTA 09/24/2015. FINDINGS: Cardiovascular: Good contrast bolus timing in the pulmonary arterial tree. Mild respiratory motion. There is no central or hilar pulmonary embolus. The segmental branches also appear to be patent. There is no convincing subsegmental filling defect. Calcified coronary artery and Calcified aortic atherosclerosis. No cardiomegaly or pericardial effusion. Mediastinum/Nodes: No mediastinal lymphadenopathy. Enteric tube courses through the esophagus and into the stomach. Lungs/Pleura: Confluent reticulonodular opacity throughout the left upper lobe and lingula. Less pronounced similar opacity in the left lower lobe sparing the superior segment. Similar  opacity in the medial basal segment of the right lower lobe. Some superimposed dependent atelectasis as well which is enhancing. There is trace pleural fluid in the left lung. Endotracheal tube tip in good position above the carina. Atelectatic changes to the mainstem bronchi. Underlying centrilobular emphysema. Mildly lobulated right upper lobe 7-8 mm pulmonary nodule is stable since 2017, and stable by report since 2014 at that time-benign. Upper Abdomen: Benign left adrenal myelolipoma. Mild reflux of IV contrast into the hepatic veins. Otherwise negative visible liver, gallbladder, spleen, pancreas, right adrenal gland and bowel. Enteric tube terminates in the stomach. Musculoskeletal: Advanced degenerative changes in the mid and lower thoracic spine. No acute osseous abnormality identified. Review of the MIP images confirms the above findings. IMPRESSION: 1. Negative for PE. Mild respiratory motion degrades some distal pulmonary branch detail but there is no convincing pulmonary embolus. 2. Multilobar Pneumonia maximal in the left upper  lobe. Early right lower lobe involvement. Trace left pleural effusion. 3. Stable and benign right upper lobe pulmonary nodule. Underlying Aortic Atherosclerosis (ICD10-I70.0) and Emphysema (ICD10-J43.9). Calcified coronary artery atherosclerosis. Electronically Signed   By: Genevie Ann M.D.   On: 09/11/2020 09:35   DG Chest Port 1 View  Result Date: 09/12/2020 CLINICAL DATA:  Acute respiratory failure, intubation EXAM: PORTABLE CHEST 1 VIEW COMPARISON:  Radiograph and CT 09/11/2020 FINDINGS: *Low positioning of the endotracheal tube 1.5 cm from the carina. *Transesophageal tube tip and side port distal to the GE junction, coiled in the left upper quadrant. *Telemetry leads and external support devices overlie the chest. Persistent multifocal patchy and interstitial opacities are present throughout both lungs, most pronounced within the left hemithorax though not significantly changed from recent comparison radiograph and CT. Small left effusion. Cardiomediastinal contours are unchanged from prior with a calcified aorta. No acute osseous or soft tissue abnormality. IMPRESSION: Persistent widespread pulmonary opacity in both lungs, left greater than right. Low positioning of the endotracheal tube 1.5 cm from the carina, consider retraction 2-3 cm to the mid trachea. Additional support devices as above. Aortic Atherosclerosis (ICD10-I70.0). These results will be called to the ordering clinician or representative by the Radiologist Assistant, and communication documented in the PACS or [employer]. Electronically Signed   By: Lovena Le M.D.   On: 09/12/2020 06:32   DG Chest Port 1 View  Result Date: 09/11/2020 CLINICAL DATA:  75 year old female with unexplained altered mental status. Intubated and unresponsive. EXAM: PORTABLE CHEST 1 VIEW COMPARISON:  Portable chest 0043 hours today and earlier. FINDINGS: Portable AP semi upright view at 0706 hours. Stable lines and tubes. Mildly regressed widespread patchy  airspace and interstitial opacity in the left lung from earlier today, now resembling that on 09/10/2020. Allowing for portable technique the right lung remains clear. No pneumothorax. Normal cardiac size and mediastinal contours. Negative visible bowel gas pattern. No acute osseous abnormality identified. IMPRESSION: 1. Mildly improved widespread left lung opacity from earlier today. No new cardiopulmonary abnormality. 2.  Stable lines and tubes. Electronically Signed   By: Genevie Ann M.D.   On: 09/11/2020 09:22   DG Chest Portable 1 View  Result Date: 09/11/2020 CLINICAL DATA:  Endotracheal tube and orogastric tube placement. EXAM: PORTABLE CHEST 1 VIEW COMPARISON:  September 10, 2020 FINDINGS: An endotracheal tube is seen with its distal tip approximately 3.9 cm from the carina. A nasogastric tube is noted with its distal tip overlying the expected region of the gastric fundus. Marked severity infiltrate is seen throughout the left lung. This is  mildly increased in severity when compared to the prior study. Mild, stable right basilar atelectasis and/or infiltrate is seen. There is no evidence of a pleural effusion or pneumothorax. The heart size and mediastinal contours are within normal limits. There is moderate severity calcification of the aortic arch. The visualized skeletal structures are unremarkable. IMPRESSION: Interval endotracheal tube and nasogastric tube placement positioning, as described above. Electronically Signed   By: Virgina Norfolk M.D.   On: 09/11/2020 02:57   DG Chest Portable 1 View  Result Date: 09/10/2020 CLINICAL DATA:  Respiratory distress. EXAM: PORTABLE CHEST 1 VIEW COMPARISON:  June 04, 2016. FINDINGS: The heart size and mediastinal contours are within normal limits. No pneumothorax or pleural effusion is noted. New large left lower lobe airspace opacity is noted concerning for pneumonia. Minimal right basilar atelectasis or infiltrate is noted. The visualized skeletal  structures are unremarkable. IMPRESSION: New large left lower lobe airspace opacity is noted concerning for pneumonia. Aortic Atherosclerosis (ICD10-I70.0). Electronically Signed   By: Marijo Conception M.D.   On: 09/10/2020 14:51    Telemetry    Sinus rhythm to sinus tach with runs of atrial tachycardia up to 145 bpm- Personally Reviewed  Cardiac Studies   2D Echocardiogram 4.30.2022  Recommend limited echo with iv contrast agent (definity) for addequate  assessment of LVEF and wall motion.. Left ventricular ejection fraction,  by estimation, is 25 to 30%. The left ventricle has severely decreased  function. Left ventricular  endocardial border not optimally defined to evaluate regional wall motion.  Left ventricular diastolic parameters are indeterminate.   2. Right ventricular systolic function is normal. The right ventricular  size is normal.   3. The mitral valve was not well visualized. Mild mitral valve  regurgitation.   4. The aortic valve was not well visualized. Aortic valve regurgitation  is not visualized.  _____________   Patient Profile     75 y.o. female with a history of CAD status post prior VF arrest and RCA stenting complicated by prior restenosis, chronic combined systolic diastolic congestive heart failure, hypertension, hyperlipidemia, ongoing tobacco abuse, COPD, anemia, and narcotic abuse on chronic Suboxone therapy, who was admitted 4/30 w/ resp failure & PNA req intubation, and transient ST elevation w/ subsequent rise in HsTrop to 2455. Echo 4/30 w/ new LV dysfxn and EF of 25-30%.  Assessment & Plan    1.  Left lower lobe pneumonia/acute on chronic respiratory failure/acute exacerbation of COPD: Increasing dyspnea, cough, congestion for about a week prior to admission, with worsening dyspnea on April 29.  Initially managed with BiPAP but required intubation in the setting of hypotension and acute pulmonary edema in the early hours of April 30.  Chest x-ray with  left lower lobe pneumonia.  Antibiotics, steroids, nebulizers per critical care.  New LV dysfunction also noted with evidence of heart failure on chest x-ray.  See below.  2.  Acute on chronic combined systolic and diastolic congestive heart failure/acute pulmonary edema: Echo this admission with new LV dysfunction and an EF of 25 to 30%.  Chest x-ray with worsening lung opacities and heart failure.  Ideally would like to diurese - holding @ rec of CCM due to hypotension.  As previously noted, will need right and left heart cath following recovery from above.  Unable to implement guideline directed medical therapy secondary to ongoing hypotension intermittently requiring phenylephrine.  3.  Non-STEMI/CAD: History of CAD with multiple interventions to the right coronary artery due to in-stent restenosis.  Also  noted to have ostial LAD disease.  Peak troponin of 2455 in the setting of above.  She was also noted to have transient lateral ST segment elevation on admission.  Heparin has been discontinued in the setting of bloody respiratory secretions.  As above, plan on diagnostic catheterization once she has shown improved recovery.  4.  Hypotension: Vasopressor therapy per critical care medicine.  Home antihypertensives are on hold.  5.  Hyperlipidemia: On statin therapy at home.  6.  Hypokalemia: Normal this morning.  7.  Tobacco abuse: Will require cessation counseling.  8.  Chronic narcotic usage: On Suboxone at home.  9.  PSVT: Runs of what appears to be atrial tachycardia interspersed within sinus tachycardia with rates of 245 bpm.  She did not tolerate beta-blocker therapy yesterday secondary to recurrent hypotension requiring reinitiation of phenylephrine.  Rates being driven by acute illness and respiratory failure.  Follow.  Signed, Murray Hodgkins, NP  09/12/2020, 2:22 PM    For questions or updates, please contact   Please consult [email or web address] for contact info under Cardiology/STEMI.

## 2020-09-12 NOTE — Procedures (Addendum)
Central Venous Catheter Insertion Procedure Note  Kathryn Garrett  [id number]  [birth year]  Date:09/12/20  Time:10:20 PM   Provider Performing:Malinda Mayden L Rust-Chester   Procedure: Insertion of Non-tunneled Central Venous [phone]) with US guidance ([id number])   Indication(s) Medication administration  Consent Risks of the procedure as well as the alternatives and risks of each were explained to the patient and/or caregiver.  Consent for the procedure was obtained and is signed in the bedside chart  Anesthesia Topical only with 1% lidocaine   Timeout Verified patient identification, verified procedure, site/side was marked, verified correct patient position, special equipment/implants available, medications/allergies/relevant history reviewed, required imaging and test results available.  Sterile Technique Maximal sterile technique including full sterile barrier drape, hand hygiene, sterile gown, sterile gloves, mask, hair covering, sterile ultrasound probe cover (if used).  Procedure Description Area of catheter insertion was cleaned with chlorhexidine and draped in sterile fashion.  With real-time ultrasound guidance a central venous catheter was placed into the right internal jugular vein. Nonpulsatile blood flow and easy flushing noted in all ports.  The catheter was sutured in place and sterile dressing applied.  Complications/Tolerance None; patient tolerated the procedure well. Chest X-ray is ordered to verify placement for internal jugular or subclavian cannulation.   Chest x-ray is not ordered for femoral cannulation.  EBL Minimal  Specimen(s) None   Central line retracted 2 cm after CXR placed the catheter tip to be overlying the atrio caval junction.    Domingo Pulse Rust-Chester, AGACNP-BC Acute Care Nurse Practitioner [facility]   [phone] / [phone] Please see Amion for pager details.

## 2020-09-12 NOTE — Progress Notes (Signed)
Pharmacy Electrolyte Monitoring Consult:  Pharmacy consulted to assist in monitoring and replacing electrolytes in this 75 y.o. female admitted on 09/10/2020 with Respiratory Distress  Labs:  Sodium (mmol/L)  Date Value  09/12/2020 142  10/02/2019 141  03/23/2013 138   Potassium (mmol/L)  Date Value  09/12/2020 4.3  03/23/2013 4.2   Magnesium (mg/dL)  Date Value  09/12/2020 2.1  03/20/2013 2.0   Phosphorus (mg/dL)  Date Value  09/12/2020 3.9   Calcium (mg/dL)  Date Value  09/12/2020 8.4 (L)   Calcium, Total (mg/dL)  Date Value  03/23/2013 9.0   Albumin (g/dL)  Date Value  09/11/2020 3.4 (L)  10/02/2019 4.2  03/19/2013 3.3 (L)    Assessment: Patient is a 75 y/o F with medical history including CAD s/p stenting, CHF, COPD who is admitted with COPD exacerbation / pneumonia / decompensated heart failure / NSTEMI. Pharmacy has been consulted to assist with electrolyte monitoring and replacement as indicated.   Goals of therapy: K > 2 Mg > 2 All other electrolytes within normal limits  Plan --No electrolyte replacement warranted at this time --Follow-up electrolytes with AM labs tomorrow  Benita Gutter 09/12/2020 3:58 PM

## 2020-09-12 NOTE — Progress Notes (Signed)
NAME:  Kathryn Garrett, MRN:  [id number], DOB:  1945/05/18, LOS: 2 ADMISSION DATE:  09/10/2020, CONSULTATION DATE: 09/11/2020 REFERRING MD: Dr. Damita Dunnings, CHIEF COMPLAINT: Shortness of breath  History of Present Illness:  75 year old female presented to the [facility] ED from home with complaints of shortness of breath.  Patient reported 2 to 3 days of progressive shortness of breath and associated new onset nonproductive cough as well as a subjective fever.  Per ED documentation she denied associated chest pain/palpitations/diaphoresis.  And denies any recent nausea/vomiting, chills/rigors/myalgias.  Patient confirmed a history of COPD and current smoking history stating she is not on any supplemental oxygen at baseline.  ED course: Patient received cefepime due to concerns for a left lower lobe infiltrate, 500 mL NS bolus & IV fluids at 150 mL an hour Initial vitals: Afebrile at 98, tachypneic at 22, tachycardic at 108, BP 127/72 & 99% on BiPAP Significant labs: Serum bicarb 15, hyperglycemic at 350, troponin 70 > 745 > 1838, BNP 222.6, VBG revealed metabolic acidosis: 7.82/95/621/30.8, leukocytosis at 40.2, lactic acidosis 2.5> 2.2 > 2.5  TRH hospitalist were consulted for admission.  Around midnight PCCM was called due to patient's deteriorating respiratory status.  Per ED provider Dr. Karma Greaser and care RN the patient complained that " I can't breathe", was given a dose of Ativan and became somnolent on BiPAP.  Coarse crackles auscultated bilaterally, and the decision was made to emergently intubate the patient and place her on mechanical ventilation. PCCM consulted for management Pertinent  Medical History  CAD status post stent to the RCA with in-stent restenosis in 02/02/2016 & in-stent restenosis in January 2018 CHF COPD Current smoker  Significant Hospital Events: Including procedures, antibiotic start and stop dates in addition to other pertinent events   . 09/10/2020-patient admits admitted with  hospitalist service for COPD exacerbation and left lower lobe pneumonia . 09/11/2020-overnight patient had suspected flash pulmonary edema, dyspnea followed by somnolence requiring emergent intubation and mechanical ventilation.  Admitted to ICU . 09/12/20- patient failed SBT , she had blood tinged secretions fromETT and aggitation, family was at bedside and we agreed on giving her more time and try again.   Interim History / Subjective:  Patient unresponsive on BiPAP, not responsive to painful stimuli.  Dr. Karma Greaser setting up for intubation.  Vital signs include hypertension with SBP in the 190s and tachycardia with heart rate in the 130's  Objective   Blood pressure 133/78, pulse 95, temperature 98.42 F (36.9 C), resp. rate 19, height 4\' 11"  (1.499 m), weight 63.3 kg, SpO2 98 %.    Vent Mode: PRVC FiO2 (%):  [30 %-50 %] 30 % Set Rate:  [18 bmp] 18 bmp Vt Set:  [450 mL] 450 mL PEEP:  [5 cmH20-8 cmH20] 5 cmH20 Plateau Pressure:  [12 cmH20-20 cmH20] 20 cmH20   Intake/Output Summary (Last 24 hours) at 09/12/2020 1100 Last data filed at 09/12/2020 1025 Gross per 24 hour  Intake 3793.45 ml  Output 1450 ml  Net 2343.45 ml   Filed Weights   09/10/20 1421 09/11/20 0346 09/12/20 0233  Weight: 68.2 kg 63.6 kg 63.3 kg    Examination: General: Adult female, critically ill, lying in bed-unresponsive intubation being prepared HEENT: MM pink/moist, anicteric, atraumatic, neck supple Neuro: Unresponsive, unable to follow commands, PERRL +3 CV: s1s2 RRR, sinus tachycardia on monitor, no r/m/g Pulm: Regular, non labored on BiPAP-being transitioned to mechanical ventilation, breath sounds coarse crackles-BUL & diminished-BLL GI: soft, rounded, non tender, bs x 4 Skin: Limited  exam- no rashes/lesions noted Extremities: warm/dry, pulses + 2 R/P, trace edema noted BLE  Labs/imaging that I have personally reviewed  (right click and "Reselect all SmartList Selections" daily)   09/12/2020-patient remains  on the ventilator PRVC, blood and respiratory cultures are pending, leukocytosis trending down from 40-27,000, BMP with metabolic acidosis preserved renal, beta natruretic peptide trending down from 1300 to 970.   IMAGING     Assessment & Plan:   Acute Hypoxic Respiratory Failure -Left lung pneumonia           -present on admission-tracheal aspirate pending             -initially febrile over 101 with severe leukocytosis both are improving fevers have now resolved leukocytosis significantly improved on IV doxycycline and cefepime.             -CXR repeat with still significant sized consolidated infiltrate over the left lung present -Acute decompensated systolic CHF-cardiology on case appreciate input -Acute exacerbation of COPD-noted significant centrilobular emphysema on current CT chest-we will decrease steroids to 40 IV daily from twice daily due to overlying severe infection with pneumonia -will repeat tracheal aspirate with cytology due to some concern for possible overlying malignancy and lieu of hemoptysis. -Failed spontaneous breathing trial today currently remains on PRVC, reviewed with respiratory therapist.  Sedated to a RASS of -1   Sepsis due to pneumonia of the left lung -Present on admission cultures are pending-currently treating with IV antibiotics cefepime and doxycycline -MRSA PCR is positive -Calcitonin trend -Lactate trended -Short-term goal is improvement of pulmonary physiology to liberate from mechanical ventilation -Solu-Medrol 40 IV twice daily changed to once daily - COVID19 Negative  -Respiratory viral panel -serum fungitell -legionella ab -strep pneumoniae ur AG -Histoplasma Ur Ag -tracheal aspt resp cultures -AFB sputum expectorated specimen -tracheal aspt cytology     Acute decompensated systolic CHF with EF less than 30%    -Cardiology on case appreciate input    -Holding IV Lasix twice daily 40 mg today due to hypotension in the setting of  sepsis    Nonmassive hemoptysis  - Suspect due to pneumonia with overlying anticoagulation for NSTEMI  - Holding heparin for now    N-STEMI PMHx: CAD status post stent to RCA requiring additional interventions due to restenosis, HFpEF -Diurese held due to hypotension - Trend troponins  - Echocardiogram ordered - heparin drip holding due to hemoptysis - anti platelet: Plavix - continuous cardiac monitoring - Cardiology consulted, appreciate input    Best practice (right click and "Reselect all SmartList Selections" daily)  Diet:  NPO Pain/Anxiety/Delirium protocol (if indicated): Yes (RASS goal -1) VAP protocol (if indicated): Yes DVT prophylaxis: Systemic AC GI prophylaxis: H2B Glucose control:  SSI Yes Central venous access:  N/A Arterial line:  N/A Foley:  Yes, and it is still needed Mobility:  bed rest  PT consulted: N/A Code Status:  full code Disposition: ICU  Labs   CBC: Recent Labs  Lab 09/10/20 1417 09/11/20 0437 09/12/20 0441  WBC 40.2* 30.7* 27.8*  NEUTROABS  --  24.5*  --   HGB 13.5 14.2 12.6  HCT 42.8 44.8 40.7  MCV 96.2 97.0 96.9  PLT 429* 324 0000000    Basic Metabolic Panel: Recent Labs  Lab 09/10/20 1417 09/10/20 1743 09/11/20 0437 09/12/20 0441  NA 136  --  142 142  K 3.8  --  3.3* 4.3  CL 106  --  108 115*  CO2 15*  --  19* 19*  GLUCOSE 350*  --  182* 156*  BUN 22  --  26* 31*  CREATININE 0.89  --  0.87 0.72  CALCIUM 8.8*  --  8.2* 8.4*  MG  --  2.4 2.1 2.1  PHOS  --   --  5.1* 3.9   GFR: Estimated Creatinine Clearance: 49.1 mL/min (by C-G formula based on SCr of 0.72 mg/dL). Recent Labs  Lab 09/10/20 1417 09/10/20 1600 09/10/20 1954 09/10/20 2050 09/11/20 0437 09/11/20 1720 09/11/20 2011 09/12/20 0441  PROCALCITON  --   --  0.12  --   --   --   --   --   WBC 40.2*  --   --   --  30.7*  --   --  27.8*  LATICACIDVEN  --    < >  --  2.5* 5.5* 2.5* 1.9  --    < > = values in this interval not displayed.    Liver  Function Tests: Recent Labs  Lab 09/10/20 1417 09/11/20 0437 09/11/20 1720  AST 37 42* 23  ALT 42 58*  --   ALKPHOS 93 97  --   BILITOT 0.7 0.8  --   PROT 7.0 6.9  --   ALBUMIN 3.7 3.4*  --    No results for input(s): LIPASE, AMYLASE in the last 168 hours. No results for input(s): AMMONIA in the last 168 hours.  ABG    Component Value Date/Time   PHART 7.38 09/11/2020 1112   PCO2ART 35 09/11/2020 1112   PO2ART 86 09/11/2020 1112   HCO3 20.7 09/11/2020 1112   TCO2 23 01/14/2009 0327   ACIDBASEDEF 3.7 (H) 09/11/2020 1112   O2SAT 96.3 09/11/2020 1112     Coagulation Profile: Recent Labs  Lab 09/10/20 1743  INR 1.4*    Cardiac Enzymes: Recent Labs  Lab 09/11/20 1720  CKMB 26.6*    HbA1C: Hgb A1c MFr Bld  Date/Time Value Ref Range Status  09/11/2020 04:37 AM 6.4 (H) 4.8 - 5.6 % Final    Comment:    (NOTE) Pre diabetes:          5.7%-6.4%  Diabetes:              >6.4%  Glycemic control for   <7.0% adults with diabetes   10/02/2019 12:28 PM 6.4 (H) 4.8 - 5.6 % Final    Comment:             Prediabetes: 5.7 - 6.4          Diabetes: >6.4          Glycemic control for adults with diabetes: <7.0     CBG: Recent Labs  Lab 09/11/20 1555 09/11/20 1930 09/11/20 2354 09/12/20 0343 09/12/20 0746  GLUCAP 145* 133* 131* 142* 139*    Review of Systems:   UTA patient unresponsive -preparing for emergent intubation  Past Medical History:  She,  has a past medical history of Anemia, mild, Arthritis, CAD (coronary artery disease), Cardiac arrest ([place]) (01/2009), Chest pain (06/04/2016), Chronic bronchitis ([place]), Chronic combined systolic and diastolic CHF (congestive heart failure) ([place]), COPD (chronic obstructive pulmonary disease) ([place]), Hyperlipidemia (01/28/2016), Hyperlipidemia LDL goal <70 (01/28/2016), Hypertension, Lung mass, MI (myocardial infarction) ([place]) (01/2009), Narcotic abuse ([place]), Pleural effusion on left (09/24/2015), Pneumonia (06/2014; 09/2015),  Pre-diabetes, Pulmonary nodule, PVD (peripheral vascular disease) ([place]), S/P PTCA (percutaneous transluminal coronary angioplasty) 02/10/16 to RCA lesion for in stent restenois (02/11/2016), Subclavian artery stenosis ([place]), Tobacco abuse, and Unstable angina ([place]).   Surgical  History:   Past Surgical History:  Procedure Laterality Date  . ABDOMINAL HYSTERECTOMY    . ANKLE FRACTURE SURGERY Right 2000s  . CARDIAC CATHETERIZATION N/A 02/10/2016   Procedure: Left Heart Cath and Coronary Angiography;  Surgeon: Jettie Booze, MD;  Location: [facility];  Service: Cardiovascular;  Laterality: N/A;  . CARDIAC CATHETERIZATION N/A 02/10/2016   Procedure: Coronary Balloon Angioplasty;  Surgeon: Jettie Booze, MD;  Location: [place] [facility];  Service: Cardiovascular;  Laterality: N/A;  instent RCA  . CARDIAC CATHETERIZATION N/A 06/05/2016   Procedure: Left Heart Cath and Coronary Angiography;  Surgeon: Leonie Man, MD;  Location: [place] [facility];  Service: Cardiovascular;  Laterality: N/A;  . CARDIAC CATHETERIZATION N/A 06/05/2016   Procedure: Coronary Balloon Angioplasty;  Surgeon: Leonie Man, MD;  Location: [place] [facility];  Service: Cardiovascular;  Laterality: N/A;  . CHEST TUBE INSERTION N/A 10/08/2015   Procedure: PLEURX CATH REMOVAL;  Surgeon: [name], MD;  Location: [facility];  Service: Thoracic;  Laterality: N/A;  . CORONARY ANGIOPLASTY WITH STENT PLACEMENT  01/2009; 2012  . FRACTURE SURGERY    . VIDEO ASSISTED THORACOSCOPY (VATS)/THOROCOTOMY Left 09/29/2015   Procedure: PREOP BRONCHOSCOPY, LEFT THORACOSCOPY, POSSIBLE THORACOTOMY, PLEURAL BIOPSY, TALC;  Surgeon: [name], MD;  Location: [facility];  Service: General;  Laterality: Left;     Social History:   reports that she has been smoking cigarettes. She has a 28.00 pack-year smoking history. She has never used smokeless tobacco. She reports that she does not drink alcohol and does not use drugs.   Family  History:  Her family history includes Cancer in her brother; Coronary artery disease in her father and mother; Early death in her father; Heart attack in her father and mother; Hyperlipidemia in her father and mother.   Allergies Allergies  Allergen Reactions  . Azithromycin Rash     Home Medications  Prior to Admission medications   Medication Sig Start Date End Date Taking? Authorizing Provider  acetaminophen (TYLENOL) 500 MG tablet Take 1,000 mg by mouth every 6 (six) hours as needed for headache.   Yes [provider]  albuterol (PROVENTIL HFA;VENTOLIN HFA) 108 (90 Base) MCG/ACT inhaler Inhale 2 puffs into the lungs every 4 (four) hours as needed for wheezing or shortness of breath.   Yes [provider]  amoxicillin (AMOXIL) 500 MG tablet Take 500 mg by mouth 3 (three) times daily. 08/03/20  Yes [provider]  aspirin EC 81 MG tablet Take 81 mg by mouth at bedtime.    Yes [provider]  azelastine (ASTELIN) 0.1 % nasal spray USE 2 SPRAYS BID UNTIL DIRECTED TO STOP. USE FOR RUNNY NOSE 08/14/17  Yes [provider]  budesonide-formoterol (SYMBICORT) 160-4.5 MCG/ACT inhaler Inhale 1 puff into the lungs 2 (two) times daily. 03/05/18  Yes Pleas Koch, NP  Buprenorphine HCl-Naloxone HCl 8-2 MG FILM Place 1 Film under the tongue 3 (three) times daily.   Yes [provider]  cetirizine (ZYRTEC) 10 MG tablet Take 1 tablet (10 mg total) by mouth daily. 03/11/18  Yes Lacy Duverney M, PA-C  clopidogrel (PLAVIX) 75 MG tablet TAKE 1 TABLET(75 MG) BY MOUTH AT BEDTIME 12/23/19  Yes Jettie Booze, MD  dexamethasone (DECADRON) 6 MG tablet dexamethasone 6 mg tablet  TAKE 1 TABLET BY MOUTH EVERY DAY   Yes [provider]  doxycycline (VIBRAMYCIN) 100 MG capsule doxycycline hyclate 100 mg capsule  TAKE 1 CAPSULE BY MOUTH  TWICE DAILY   Yes [provider]  fluticasone (FLONASE) 50 MCG/ACT nasal spray Place 1 spray into both  nostrils daily.   Yes [provider]  furosemide (LASIX) 40 MG tablet Take 1 tablet (40 mg total) by mouth daily as needed (swelling). 01/16/20  Yes Jettie Booze, MD  ibuprofen (ADVIL,MOTRIN) 600 MG tablet Take 600 mg by mouth as needed for pain. 08/14/17  Yes [provider]  isosorbide mononitrate (IMDUR) 30 MG 24 hr tablet Take 1 tablet (30 mg total) by mouth daily. 10/02/19  Yes Jettie Booze, MD  levothyroxine (SYNTHROID) 88 MCG tablet Take 88 mcg by mouth every morning. 05/26/20  Yes [provider]  metoprolol tartrate (LOPRESSOR) 25 MG tablet Take 50 mg by mouth 2 (two) times daily.   Yes [provider]  mupirocin ointment (BACTROBAN) 2 % mupirocin 2 % topical ointment  APPLY TOPICALLY TO LOWER LEG THREE TIMES DAILY FOR 10 DAYS   Yes [provider]  nystatin cream (MYCOSTATIN) Apply topically 2 (two) times daily. 08/11/20  Yes [provider]  potassium chloride SA (KLOR-CON) 20 MEQ tablet Take 1 tablet (20 mEq total) by mouth daily as needed (Take with furosemide (lasix)). 01/16/20  Yes Jettie Booze, MD  predniSONE (DELTASONE) 20 MG tablet prednisone 20 mg tablet   Yes [provider]  rosuvastatin (CRESTOR) 20 MG tablet TAKE 1 TABLET(20 MG) BY MOUTH DAILY 12/23/19  Yes Jettie Booze, MD  TRELEGY ELLIPTA 100-62.5-25 MCG/INH AEPB Take 1 puff by mouth daily. 08/13/20  Yes [provider]  triamcinolone cream (KENALOG) 0.1 % Apply topically 2 (two) times daily. to affected area 08/11/20  Yes [provider]  nitroGLYCERIN (NITROSTAT) 0.4 MG SL tablet Place 1 tablet (0.4 mg total) under the tongue every 5 (five) minutes as needed for chest pain. 03/12/18   Jettie Booze, MD     Critical care provider statement:    Critical care time (minutes):  109   Critical care time was exclusive of:  Separately billable procedures and  treating other patients   Critical care was necessary to treat  or prevent imminent or  life-threatening deterioration of the following conditions:   Acute hypoxemic respiratory failure, left lung pneumonia, sepsis, NSTEMI, flash pulmonary edema, acute decompensated systolic CHF, acute exacerbation of COPD, nonmassive hemoptysis, multiple comorbid conditions.   Critical care was time spent personally by me on the following  activities:  Development of treatment plan with patient or surrogate,  discussions with consultants, evaluation of patient's response to  treatment, examination of patient, obtaining history from patient or  surrogate, ordering and performing treatments and interventions, ordering  and review of laboratory studies and re-evaluation of patient's condition   I assumed direction of critical care for this patient from another  provider in my specialty: no        Ottie Glazier, M.D.  [facility] [place]

## 2020-09-13 ENCOUNTER — Inpatient Hospital Stay: Payer: Medicare HMO

## 2020-09-13 DIAGNOSIS — I10 Essential (primary) hypertension: Secondary | ICD-10-CM

## 2020-09-13 DIAGNOSIS — J9601 Acute respiratory failure with hypoxia: Secondary | ICD-10-CM

## 2020-09-13 DIAGNOSIS — I5020 Unspecified systolic (congestive) heart failure: Secondary | ICD-10-CM | POA: Diagnosis not present

## 2020-09-13 DIAGNOSIS — J441 Chronic obstructive pulmonary disease with (acute) exacerbation: Secondary | ICD-10-CM

## 2020-09-13 DIAGNOSIS — I214 Non-ST elevation (NSTEMI) myocardial infarction: Secondary | ICD-10-CM | POA: Diagnosis not present

## 2020-09-13 DIAGNOSIS — J189 Pneumonia, unspecified organism: Secondary | ICD-10-CM | POA: Diagnosis not present

## 2020-09-13 DIAGNOSIS — I5023 Acute on chronic systolic (congestive) heart failure: Secondary | ICD-10-CM | POA: Diagnosis not present

## 2020-09-13 LAB — URINE CULTURE: Culture: 20000 — AB

## 2020-09-13 LAB — CBC WITH DIFFERENTIAL/PLATELET
Abs Immature Granulocytes: 0.23 10*3/uL — ABNORMAL HIGH (ref 0.00–0.07)
Basophils Absolute: 0 10*3/uL (ref 0.0–0.1)
Basophils Relative: 0 %
Eosinophils Absolute: 0 10*3/uL (ref 0.0–0.5)
Eosinophils Relative: 0 %
HCT: 39.9 % (ref 36.0–46.0)
Hemoglobin: 12.5 g/dL (ref 12.0–15.0)
Immature Granulocytes: 1 %
Lymphocytes Relative: 16 %
Lymphs Abs: 2.8 10*3/uL (ref 0.7–4.0)
MCH: 30.3 pg (ref 26.0–34.0)
MCHC: 31.3 g/dL (ref 30.0–36.0)
MCV: 96.8 fL (ref 80.0–100.0)
Monocytes Absolute: 1.3 10*3/uL — ABNORMAL HIGH (ref 0.1–1.0)
Monocytes Relative: 7 %
Neutro Abs: 13.8 10*3/uL — ABNORMAL HIGH (ref 1.7–7.7)
Neutrophils Relative %: 76 %
Platelets: 277 10*3/uL (ref 150–400)
RBC: 4.12 MIL/uL (ref 3.87–5.11)
RDW: 15.6 % — ABNORMAL HIGH (ref 11.5–15.5)
WBC: 18.1 10*3/uL — ABNORMAL HIGH (ref 4.0–10.5)
nRBC: 0.1 % (ref 0.0–0.2)

## 2020-09-13 LAB — CULTURE, RESPIRATORY W GRAM STAIN

## 2020-09-13 LAB — COOXEMETRY PANEL
Carboxyhemoglobin: 2 % — ABNORMAL HIGH (ref 0.5–1.5)
Carboxyhemoglobin: 2.2 % — ABNORMAL HIGH (ref 0.5–1.5)
Methemoglobin: 0.5 % (ref 0.0–1.5)
Methemoglobin: 0.6 % (ref 0.0–1.5)
O2 Saturation: 78.6 %
O2 Saturation: 78.1 %
Total hemoglobin: 13.7 g/dL (ref 12.0–16.0)
Total oxygen content: 76.6 mL/dL
Total oxygen content: 73.1 mL/dL

## 2020-09-13 LAB — GLUCOSE, CAPILLARY
Glucose-Capillary: 110 mg/dL — ABNORMAL HIGH (ref 70–99)
Glucose-Capillary: 141 mg/dL — ABNORMAL HIGH (ref 70–99)
Glucose-Capillary: 161 mg/dL — ABNORMAL HIGH (ref 70–99)
Glucose-Capillary: 170 mg/dL — ABNORMAL HIGH (ref 70–99)
Glucose-Capillary: 246 mg/dL — ABNORMAL HIGH (ref 70–99)
Glucose-Capillary: 89 mg/dL (ref 70–99)
Glucose-Capillary: 99 mg/dL (ref 70–99)

## 2020-09-13 LAB — BASIC METABOLIC PANEL
Anion gap: 10 (ref 5–15)
BUN: 49 mg/dL — ABNORMAL HIGH (ref 8–23)
CO2: 19 mmol/L — ABNORMAL LOW (ref 22–32)
Calcium: 8.7 mg/dL — ABNORMAL LOW (ref 8.9–10.3)
Chloride: 112 mmol/L — ABNORMAL HIGH (ref 98–111)
Creatinine, Ser: 0.76 mg/dL (ref 0.44–1.00)
GFR, Estimated: >60 mL/min (ref >60)
Glucose, Bld: 104 mg/dL — ABNORMAL HIGH (ref 70–99)
Potassium: 3.7 mmol/L (ref 3.5–5.1)
Sodium: 141 mmol/L (ref 135–145)

## 2020-09-13 LAB — MAGNESIUM: Magnesium: 2.3 mg/dL (ref 1.7–2.4)

## 2020-09-13 LAB — PROCALCITONIN: Procalcitonin: 0.45 ng/mL

## 2020-09-13 LAB — PHOSPHORUS: Phosphorus: 3.9 mg/dL (ref 2.5–4.6)

## 2020-09-13 IMAGING — MR MR HEAD W/O CM
13 of 17 series · 39 of 48 positions shown · non-contrast
Comparison: Head CT [DATE]

CLINICAL DATA: Altered mental status.  Stroke presentation.

EXAM:
MRI HEAD WITHOUT CONTRAST
TECHNIQUE: Multiplanar, multiecho pulse sequences of the brain and surrounding
structures were obtained without intravenous contrast.

[Series 5: ax dwi_tracew · axial · 3.0mm · 0.65mm/px · z∈[-5,+139]mm · 4 of 95 slices shown]
[im 1/95]
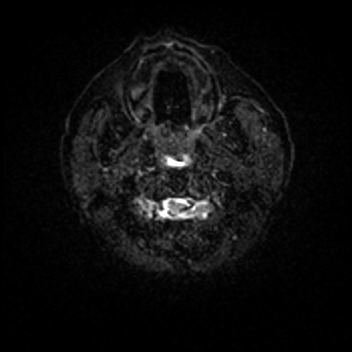
[im 32/95]
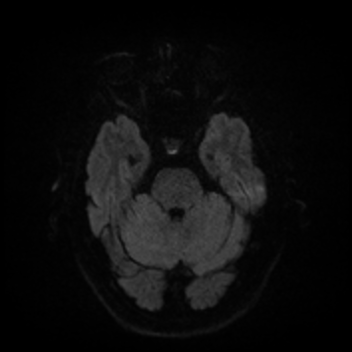
[im 63/95]
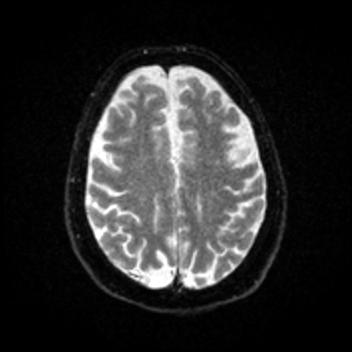
[im 95/95]
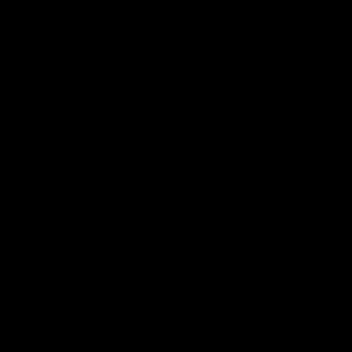

[Series 6: ax dwi_adc · axial · 3.0mm · 0.65mm/px · z∈[-5,+133]mm · 2 of 46 slices shown]
[im 1/46]
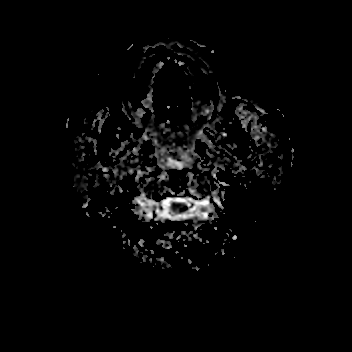
[im 46/46]
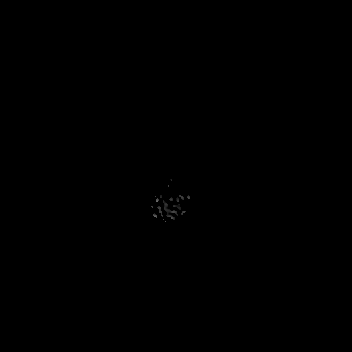

[Series 7: cor dwi_tracew · coronal · 5.0mm · 1.31mm/px · 4 of 76 slices shown]
[im 1/76]
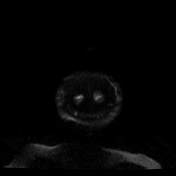
[im 26/76]
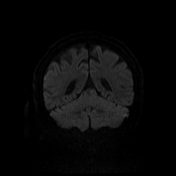
[im 51/76]
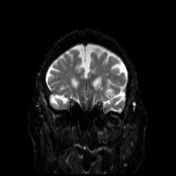
[im 76/76]
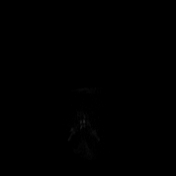

[Series 8: cor dwi_adc · coronal · 5.0mm · 1.31mm/px · 2 of 38 slices shown]
[im 1/38]
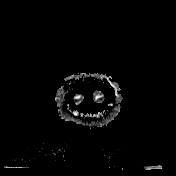
[im 38/38]
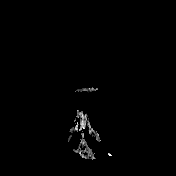

[Series 9: T1 · sagittal · 5.0mm · 0.62mm/px · 1 of 25 slices shown (1 of 2)]
[im 1/25]
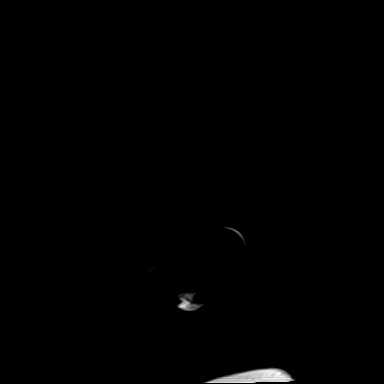

[Series 10: T2 · axial · 5.0mm · 0.53mm/px · 1 of 25 slices shown (1 of 2)]
[im 1/25]
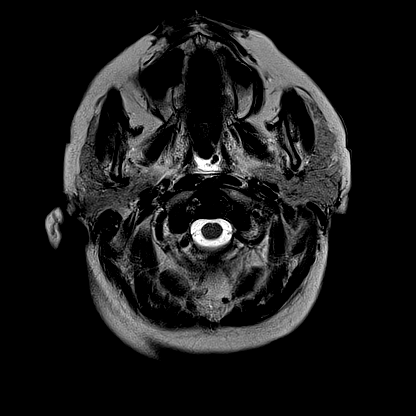

[Series 11: mag_images · axial · 3.0mm · 0.90mm/px · z∈[-21,+147]mm · 3 of 60 slices shown]
[im 1/60]
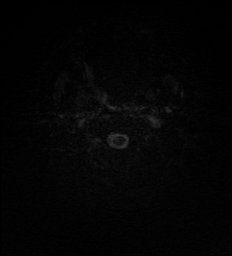
[im 30/60]
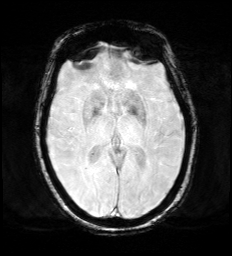
[im 60/60]
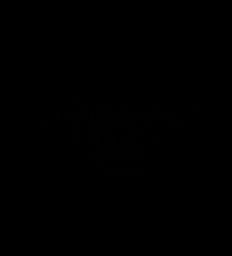

[Series 12: pha_images · axial · 3.0mm · 0.90mm/px · z∈[-21,+144]mm · 3 of 58 slices shown]
[im 1/58]
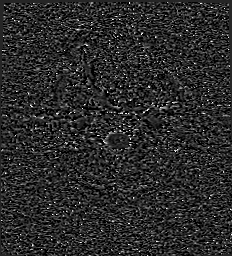
[im 29/58]
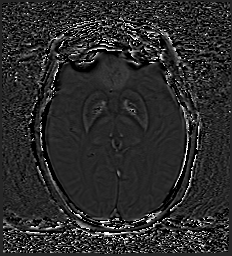
[im 58/58]
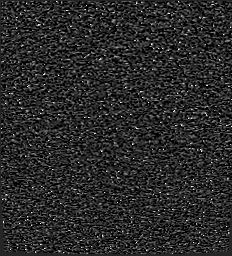

[Series 13: swi_images · axial · 3.0mm · 0.90mm/px · z∈[-21,+147]mm · 3 of 60 slices shown]
[im 1/60]
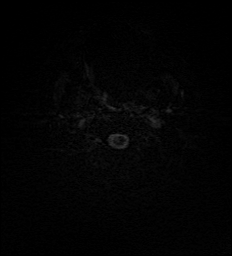
[im 30/60]
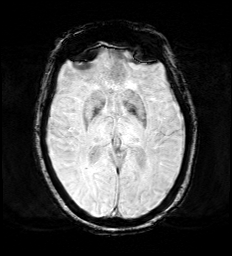
[im 60/60]
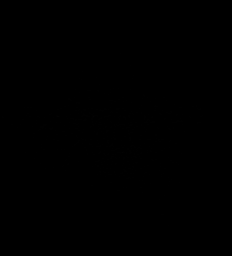

[Series 14: mip_images(sw) · axial · 24.0mm · 0.90mm/px · z∈[-11,+137]mm · 3 of 53 slices shown]
[im 1/53]
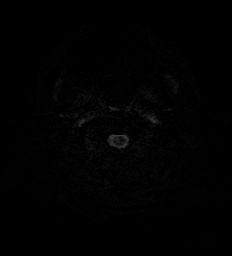
[im 27/53]
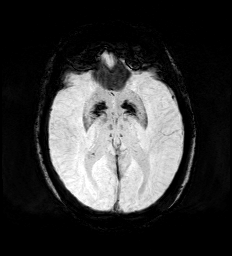
[im 53/53]
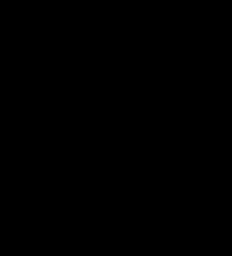

[Series 15: FLAIR · axial · 3.0mm · 0.53mm/px · z∈[-15,+138]mm · 3 of 55 slices shown]
[im 1/55]
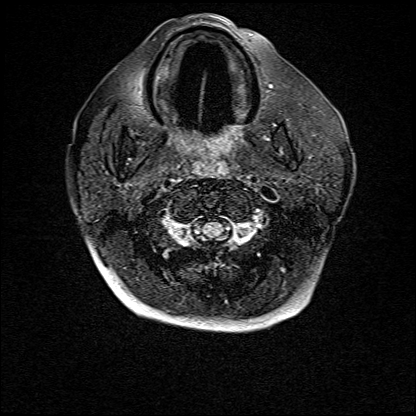
[im 28/55]
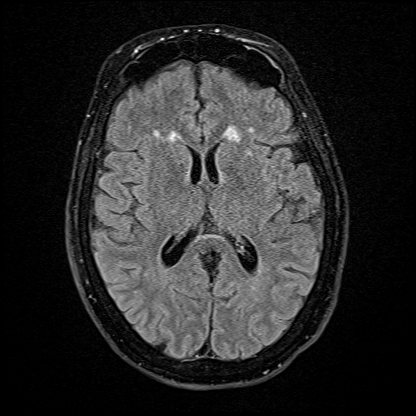
[im 55/55]
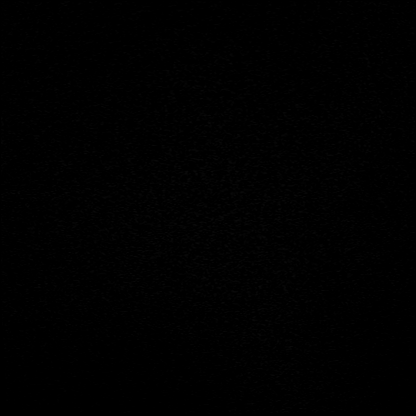

[Series 16: T1 · axial · 1.0mm · 0.98mm/px · z∈[-12,+139]mm · 8 of 160 slices shown (2 of 2)]
[im 1/160]
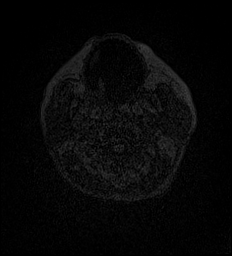
[im 23/160]
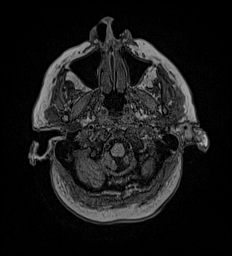
[im 46/160]
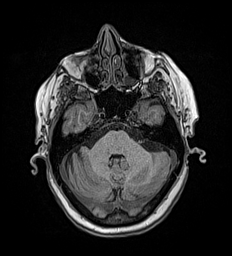
[im 69/160]
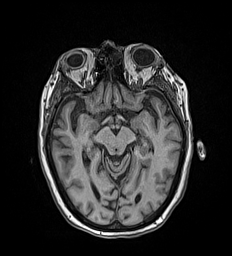
[im 91/160]
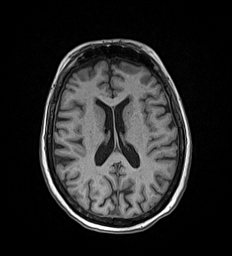
[im 114/160]
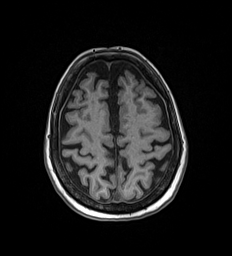
[im 137/160]
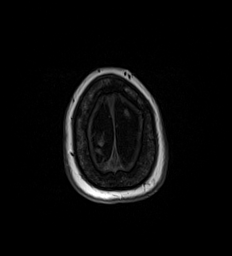
[im 160/160]
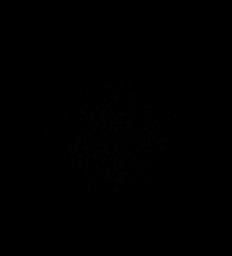

[Series 17: T2 · coronal · 5.0mm · 0.45mm/px · 2 of 31 slices shown (2 of 2)]
[im 1/31]
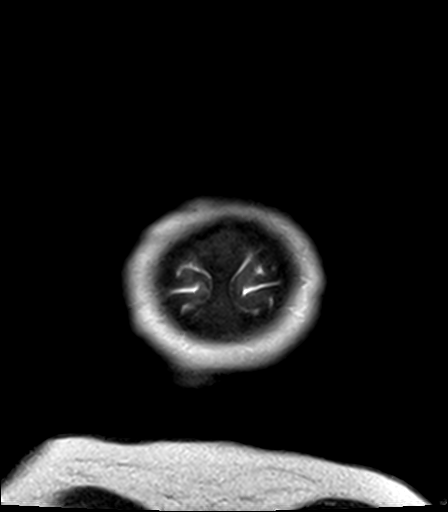
[im 31/31]
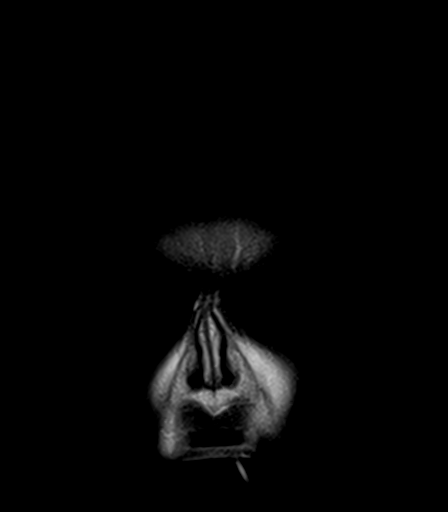

[39 of 48 positions shown; findings below may reference images not displayed]

FINDINGS: Brain: Diffusion imaging does not show any acute or subacute
infarction. The brain shows generalized age related atrophy. No
focal abnormality affects brainstem or cerebellum. Cerebral
hemispheres show mild changes of chronic small vessel disease,
fairly typical for age. No cortical or large vessel territory
infarction. No mass lesion, hemorrhage, hydrocephalus or extra-axial
collection.

Vascular: Major vessels at the base of the brain show flow.

Skull and upper cervical spine: Negative

Sinuses/Orbits: No significant sinus inflammation. Bilateral mastoid
effusions, often seen with mechanical ventilation. Orbits negative.

Other: None
IMPRESSION: No acute brain finding. Mild chronic small-vessel ischemic change of
the white matter, fairly typical for age.

## 2020-09-13 MED ORDER — VITAL HIGH PROTEIN PO LIQD
1000.0000 mL | ORAL | Status: DC
  Administered 2020-09-13 – 2020-09-17 (×5): 1000 mL

## 2020-09-13 MED ORDER — ADULT MULTIVITAMIN LIQUID CH
15.0000 mL | Freq: Every day | ORAL | Status: DC
  Administered 2020-09-14 – 2020-09-17 (×4): 15 mL
  Filled 2020-09-13 (×4): qty 15

## 2020-09-13 MED ORDER — FUROSEMIDE 10 MG/ML IJ SOLN
40.0000 mg | Freq: Once | INTRAMUSCULAR | Status: AC
  Administered 2020-09-13: 40 mg via INTRAVENOUS
  Filled 2020-09-13: qty 4

## 2020-09-13 MED ORDER — FREE WATER
30.0000 mL | Status: DC
  Administered 2020-09-13 – 2020-09-15 (×10): 30 mL

## 2020-09-13 MED ORDER — POTASSIUM CHLORIDE 20 MEQ PO PACK
40.0000 meq | PACK | Freq: Once | ORAL | Status: AC
  Administered 2020-09-13: 40 meq
  Filled 2020-09-13: qty 2

## 2020-09-13 MED ORDER — DEXMEDETOMIDINE HCL IN NACL 400 MCG/100ML IV SOLN
0.4000 ug/kg/h | INTRAVENOUS | Status: DC
  Administered 2020-09-13: 0.8 ug/kg/h via INTRAVENOUS
  Administered 2020-09-13: 0.4 ug/kg/h via INTRAVENOUS
  Administered 2020-09-14: 1.2 ug/kg/h via INTRAVENOUS
  Administered 2020-09-14: 0.8 ug/kg/h via INTRAVENOUS
  Administered 2020-09-14: 0.6 ug/kg/h via INTRAVENOUS
  Administered 2020-09-15 (×2): 1.8 ug/kg/h via INTRAVENOUS
  Administered 2020-09-15: 0.8 ug/kg/h via INTRAVENOUS
  Administered 2020-09-15 (×2): 1.6 ug/kg/h via INTRAVENOUS
  Administered 2020-09-16: 1.2 ug/kg/h via INTRAVENOUS
  Administered 2020-09-16 (×4): 1.8 ug/kg/h via INTRAVENOUS
  Administered 2020-09-17: 1 ug/kg/h via INTRAVENOUS
  Administered 2020-09-17: 0.4 ug/kg/h via INTRAVENOUS
  Filled 2020-09-13 (×21): qty 100

## 2020-09-13 NOTE — Progress Notes (Signed)
Pt off unit for MRI at this time.

## 2020-09-13 NOTE — Progress Notes (Signed)
Pharmacy Electrolyte Monitoring Consult:  Pharmacy consulted to assist in monitoring and replacing electrolytes in this 75 y.o. female admitted on 09/10/2020 with Respiratory Distress  Labs:  Sodium (mmol/L)  Date Value  09/13/2020 141  10/02/2019 141  03/23/2013 138   Potassium (mmol/L)  Date Value  09/13/2020 3.7  03/23/2013 4.2   Magnesium (mg/dL)  Date Value  09/13/2020 2.3  03/20/2013 2.0   Phosphorus (mg/dL)  Date Value  09/13/2020 3.9   Calcium (mg/dL)  Date Value  09/13/2020 8.7 (L)   Calcium, Total (mg/dL)  Date Value  03/23/2013 9.0   Albumin (g/dL)  Date Value  09/11/2020 3.4 (L)  10/02/2019 4.2  03/19/2013 3.3 (L)    Assessment: Patient is a 75 y/o F with medical history including CAD s/p stenting, CHF, COPD who is admitted with COPD exacerbation / pneumonia / decompensated heart failure / NSTEMI. Pharmacy has been consulted to assist with electrolyte monitoring and replacement as indicated.   Goals of therapy: K > 2 Mg > 2 All other electrolytes within normal limits  Plan --K 3.7 - will replace with 53mEq Kcl packet  --Follow-up electrolytes with AM labs tomorrow  Sherilyn Banker, PharmD Pharmacy Resident  09/13/2020 7:52 AM

## 2020-09-13 NOTE — Progress Notes (Signed)
Progress Note  Patient Name: Kathryn Garrett Date of Encounter: 09/13/2020  [facility] Cardiologist: Larae Grooms, MD   Subjective   Intubated.  Currently undergoing SBT but does not follow commands or open eyes.  Inpatient Medications    Scheduled Meds: . aspirin  81 mg Per Tube QHS  . budesonide (PULMICORT) nebulizer solution  0.25 mg Nebulization BID  . chlorhexidine gluconate (MEDLINE KIT)  15 mL Mouth Rinse BID  . Chlorhexidine Gluconate Cloth  6 each Topical QHS  . clopidogrel  75 mg Per Tube QHS  . docusate  100 mg Per Tube BID  . fentaNYL (SUBLIMAZE) injection  25 mcg Intravenous Once  . fluticasone  1 spray Each Nare Daily  . insulin aspart  0-15 Units Subcutaneous Q4H  . ipratropium-albuterol  3 mL Nebulization Q6H  . levothyroxine  88 mcg Per Tube Q0600  . mouth rinse  15 mL Mouth Rinse 10 times per day  . methylPREDNISolone (SOLU-MEDROL) injection  40 mg Intravenous Daily  . mupirocin ointment  1 application Nasal BID  . polyethylene glycol  17 g Per Tube Daily   Continuous Infusions: . sodium chloride    . ceFEPime (MAXIPIME) IV Stopped (09/12/20 2321)  . doxycycline (VIBRAMYCIN) IV 125 mL/hr at 09/13/20 0600  . famotidine (PEPCID) IV Stopped (09/13/20 0549)  . fentaNYL infusion INTRAVENOUS 125 mcg/hr (09/13/20 0600)  . milrinone 0.25 mcg/kg/min (09/13/20 0811)  . propofol (DIPRIVAN) infusion 50 mcg/kg/min (09/13/20 0600)   PRN Meds: acetaminophen **OR** acetaminophen, docusate sodium, metoprolol tartrate, polyethylene glycol   Vital Signs    Vitals:   09/13/20 0630 09/13/20 0645 09/13/20 0700 09/13/20 0800  BP: (!) 113/58 122/66 120/60 130/70  Pulse: 88 91 92 90  Resp: $Remo'17 18 18 11  'lQwZT$ Temp: (!) 96.98 F (36.1 C) (!) 96.98 F (36.1 C) (!) 96.98 F (36.1 C) (!) 97.16 F (36.2 C)  TempSrc:      SpO2: 99% 99% 98% 99%  Weight:      Height:        Intake/Output Summary (Last 24 hours) at 09/13/2020 0829 Last data filed at 09/13/2020  0600 Gross per 24 hour  Intake 2063.08 ml  Output 1300 ml  Net 763.08 ml   Last 3 Weights 09/13/2020 09/12/2020 09/11/2020  Weight (lbs) 152 lb 12.5 oz 139 lb 8.8 oz 140 lb 3.4 oz  Weight (kg) 69.3 kg 63.3 kg 63.6 kg      Telemetry    NSR/sinus tachycardia with PVC's.  Single 5-beat atrial run noted. - Personally Reviewed  ECG    No new tracing - Personally Reviewed  Physical Exam   GEN: Intubated and uncomfortable-appearing. Neck: Unable to assess JVP. Cardiac: Tachycardic but regular without murmurs. Respiratory: Coarse breath sounds anteriorly. GI: Soft, nontender, non-distended  MS: 2+ edema in upper extremities (R>L); No deformity. Neuro:  Agitated but does not follow commands. Psych: Unable to assess as patient is agitated and intubated.  Labs    High Sensitivity Troponin:   Recent Labs  Lab 09/10/20 2050 09/11/20 0437 09/11/20 1433 09/11/20 1611 09/11/20 2011  TROPONINIHS 1,838* 2,434* 2,265* 2,455* 2,317*      Chemistry Recent Labs  Lab 09/10/20 1417 09/11/20 0437 09/11/20 1720 09/12/20 0441 09/13/20 0439  NA 136 142  --  142 141  K 3.8 3.3*  --  4.3 3.7  CL 106 108  --  115* 112*  CO2 15* 19*  --  19* 19*  GLUCOSE 350* 182*  --  156* 104*  BUN 22 26*  --  31* 49*  CREATININE 0.89 0.87  --  0.72 0.76  CALCIUM 8.8* 8.2*  --  8.4* 8.7*  PROT 7.0 6.9  --   --   --   ALBUMIN 3.7 3.4*  --   --   --   AST 37 42* 23  --   --   ALT 42 58*  --   --   --   ALKPHOS 93 97  --   --   --   BILITOT 0.7 0.8  --   --   --   GFRNONAA >60 >60  --  >60 >60  ANIONGAP 15 15  --  8 10     Hematology Recent Labs  Lab 09/11/20 0437 09/12/20 0441 09/13/20 0439  WBC 30.7* 27.8* 18.1*  RBC 4.62 4.20 4.12  HGB 14.2 12.6 12.5  HCT 44.8 40.7 39.9  MCV 97.0 96.9 96.8  MCH 30.7 30.0 30.3  MCHC 31.7 31.0 31.3  RDW 14.6 15.4 15.6*  PLT 324 353 277    BNP Recent Labs  Lab 09/11/20 0437 09/11/20 1720 09/12/20 0441  BNP 1,009.8* 1,298.4* 970.2*     DDimer   Recent Labs  Lab 09/11/20 0437  DDIMER 3.47*     Radiology    CT HEAD WO CONTRAST  Result Date: 09/11/2020 CLINICAL DATA:  75 year old female with unexplained altered mental status. Intubated and unresponsive. EXAM: CT HEAD WITHOUT CONTRAST TECHNIQUE: Contiguous axial images were obtained from the base of the skull through the vertex without intravenous contrast. COMPARISON:  None. FINDINGS: Brain: No midline shift, ventriculomegaly, mass effect, evidence of mass lesion, intracranial hemorrhage or evidence of cortically based acute infarction. Gray-white matter differentiation appears symmetric and within normal limits for age. No cortical encephalomalacia identified. Mild dystrophic calcification in the basal ganglia. Vascular: Calcified atherosclerosis at the skull base. No suspicious intracranial vascular hyperdensity. Skull: Negative. Sinuses/Orbits: Frontoethmoidal sinus osteomas appear inconsequential. Visualized paranasal sinuses and mastoids are clear. Other: No acute orbit or scalp soft tissue finding. Chronic postoperative changes to the globes. IMPRESSION: Negative for age noncontrast CT appearance of the brain. No acute intracranial abnormality identified. Electronically Signed   By: Genevie Ann M.D.   On: 09/11/2020 09:20   CT ANGIO CHEST PE W OR WO CONTRAST  Result Date: 09/11/2020 CLINICAL DATA:  75 year old female with unexplained altered mental status. Intubated and unresponsive. EXAM: CT ANGIOGRAPHY CHEST WITH CONTRAST TECHNIQUE: Multidetector CT imaging of the chest was performed using the standard protocol during bolus administration of intravenous contrast. Multiplanar CT image reconstructions and MIPs were obtained to evaluate the vascular anatomy. CONTRAST:  5mL OMNIPAQUE IOHEXOL 350 MG/ML SOLN COMPARISON:  Portable chest 0706 hours today.  Chest CTA 09/24/2015. FINDINGS: Cardiovascular: Good contrast bolus timing in the pulmonary arterial tree. Mild respiratory motion. There is  no central or hilar pulmonary embolus. The segmental branches also appear to be patent. There is no convincing subsegmental filling defect. Calcified coronary artery and Calcified aortic atherosclerosis. No cardiomegaly or pericardial effusion. Mediastinum/Nodes: No mediastinal lymphadenopathy. Enteric tube courses through the esophagus and into the stomach. Lungs/Pleura: Confluent reticulonodular opacity throughout the left upper lobe and lingula. Less pronounced similar opacity in the left lower lobe sparing the superior segment. Similar opacity in the medial basal segment of the right lower lobe. Some superimposed dependent atelectasis as well which is enhancing. There is trace pleural fluid in the left lung. Endotracheal tube tip in good position above the carina. Atelectatic changes to the mainstem  bronchi. Underlying centrilobular emphysema. Mildly lobulated right upper lobe 7-8 mm pulmonary nodule is stable since 2017, and stable by report since 2014 at that time-benign. Upper Abdomen: Benign left adrenal myelolipoma. Mild reflux of IV contrast into the hepatic veins. Otherwise negative visible liver, gallbladder, spleen, pancreas, right adrenal gland and bowel. Enteric tube terminates in the stomach. Musculoskeletal: Advanced degenerative changes in the mid and lower thoracic spine. No acute osseous abnormality identified. Review of the MIP images confirms the above findings. IMPRESSION: 1. Negative for PE. Mild respiratory motion degrades some distal pulmonary branch detail but there is no convincing pulmonary embolus. 2. Multilobar Pneumonia maximal in the left upper lobe. Early right lower lobe involvement. Trace left pleural effusion. 3. Stable and benign right upper lobe pulmonary nodule. Underlying Aortic Atherosclerosis (ICD10-I70.0) and Emphysema (ICD10-J43.9). Calcified coronary artery atherosclerosis. Electronically Signed   By: Genevie Ann M.D.   On: 09/11/2020 09:35   DG Chest Port 1 View  Result  Date: 09/12/2020 CLINICAL DATA:  Central line placement EXAM: PORTABLE CHEST 1 VIEW COMPARISON:  09/12/2020 at 4 a.m. FINDINGS: Single frontal view of the chest demonstrates interval placement of a right internal jugular catheter tip overlying the atriocaval junction. Endotracheal tube overlies tracheal air column tip midway between thoracic inlet and carina. Enteric catheter tip projects over the gastric fundus. Cardiac silhouette is stable. Continued left greater than right ground-glass airspace disease. Small left effusion again noted. No pneumothorax. IMPRESSION: 1. No complication after central venous catheter placement. 2. Bilateral left greater than right ground-glass airspace disease worrisome for multifocal pneumonia. 3. Stable trace left effusion. Electronically Signed   By: Randa Ngo M.D.   On: 09/12/2020 22:39   DG Chest Port 1 View  Result Date: 09/12/2020 CLINICAL DATA:  Acute respiratory failure, intubation EXAM: PORTABLE CHEST 1 VIEW COMPARISON:  Radiograph and CT 09/11/2020 FINDINGS: *Low positioning of the endotracheal tube 1.5 cm from the carina. *Transesophageal tube tip and side port distal to the GE junction, coiled in the left upper quadrant. *Telemetry leads and external support devices overlie the chest. Persistent multifocal patchy and interstitial opacities are present throughout both lungs, most pronounced within the left hemithorax though not significantly changed from recent comparison radiograph and CT. Small left effusion. Cardiomediastinal contours are unchanged from prior with a calcified aorta. No acute osseous or soft tissue abnormality. IMPRESSION: Persistent widespread pulmonary opacity in both lungs, left greater than right. Low positioning of the endotracheal tube 1.5 cm from the carina, consider retraction 2-3 cm to the mid trachea. Additional support devices as above. Aortic Atherosclerosis (ICD10-I70.0). These results will be called to the ordering clinician or  representative by the Radiologist Assistant, and communication documented in the PACS or [employer]. Electronically Signed   By: Lovena Le M.D.   On: 09/12/2020 06:32   ECHOCARDIOGRAM COMPLETE  Result Date: 09/11/2020    ECHOCARDIOGRAM REPORT   Patient Name:   Kathryn Garrett Date of Exam: 09/11/2020 Medical Rec #:  [id number]         Height:       59.0 in Accession #:    [id number]        Weight:       140.2 lb Date of Birth:  [birth year]         BSA:          1.586 m Patient Age:    17 years          BP:  136/88 mmHg Patient Gender: F                 HR:           88 bpm. Exam Location:  [facility] Procedure: 2D Echo, Cardiac Doppler and Color Doppler Indications:     NSTEMI I21.4  History:         Patient has prior history of Echocardiogram examinations. CAD;                  Risk Factors:Hypertension.  Sonographer:     Alyse Low Roar Referring Phys:  [id number] Rhetta Mura Diagnosing Phys: Kate Sable MD IMPRESSIONS  1. Recommend limited echo with iv contrast agent (definity) for addequate assessment of LVEF and wall motion.. Left ventricular ejection fraction, by estimation, is 25 to 30%. The left ventricle has severely decreased function. Left ventricular endocardial border not optimally defined to evaluate regional wall motion. Left ventricular diastolic parameters are indeterminate.  2. Right ventricular systolic function is normal. The right ventricular size is normal.  3. The mitral valve was not well visualized. Mild mitral valve regurgitation.  4. The aortic valve was not well visualized. Aortic valve regurgitation is not visualized. FINDINGS  Left Ventricle: Recommend limited echo with iv contrast agent (definity) for addequate assessment of LVEF and wall motion. Left ventricular ejection fraction, by estimation, is 25 to 30%. The left ventricle has severely decreased function. Left ventricular endocardial border not optimally defined to evaluate regional wall motion. The left  ventricular internal cavity size was normal in size. There is no left ventricular hypertrophy. Left ventricular diastolic parameters are indeterminate. Right Ventricle: The right ventricular size is normal. No increase in right ventricular wall thickness. Right ventricular systolic function is normal. Left Atrium: Left atrial size was normal in size. Right Atrium: Right atrial size was normal in size. Pericardium: There is no evidence of pericardial effusion. Mitral Valve: The mitral valve was not well visualized. Mild mitral valve regurgitation. Tricuspid Valve: The tricuspid valve is not well visualized. Tricuspid valve regurgitation is mild. Aortic Valve: The aortic valve was not well visualized. Aortic valve regurgitation is not visualized. Aortic valve peak gradient measures 4.1 mmHg. Pulmonic Valve: The pulmonic valve was not well visualized. Pulmonic valve regurgitation is not visualized. Aorta: The aortic root is normal in size and structure. Venous: IVC assessment for right atrial pressure unable to be performed due to mechanical ventilation. IAS/Shunts: No atrial level shunt detected by color flow Doppler.  LEFT VENTRICLE PLAX 2D LVIDd:         4.20 cm  Diastology LVIDs:         3.77 cm  LV e' medial:    10.80 cm/s LV PW:         1.09 cm  LV E/e' medial:  12.5 LV IVS:        1.21 cm  LV e' lateral:   9.25 cm/s LVOT diam:     1.70 cm  LV E/e' lateral: 14.6 LVOT Area:     2.27 cm  RIGHT VENTRICLE RV Mid diam:    2.36 cm LEFT ATRIUM           Index       RIGHT ATRIUM          Index LA diam:      3.05 cm 1.92 cm/m  RA Area:     5.91 cm LA Vol (A2C): 16.9 ml 10.66 ml/m RA Volume:   7.72 ml  4.87 ml/m LA Vol (  A4C): 25.1 ml 15.83 ml/m  AORTIC VALVE                PULMONIC VALVE AV Area (Vmax): 1.65 cm    PV Vmax:        0.98 m/s AV Vmax:        101.00 cm/s PV Peak grad:   3.8 mmHg AV Peak Grad:   4.1 mmHg    RVOT Peak grad: 3 mmHg LVOT Vmax:      73.50 cm/s  AORTA Ao Root diam: 2.10 cm MITRAL VALVE                 TRICUSPID VALVE MV Area (PHT): 5.97 cm     TR Peak grad:   28.1 mmHg MV Decel Time: 127 msec     TR Vmax:        265.00 cm/s MV E velocity: 135.00 cm/s                             SHUNTS                             Systemic Diam: 1.70 cm Kate Sable MD Electronically signed by Kate Sable MD Signature Date/Time: 09/11/2020/2:41:45 PM    Final     Cardiac Studies   TTE (09/11/2020): 1. Recommend limited echo with iv contrast agent (definity) for addequate  assessment of LVEF and wall motion.. Left ventricular ejection fraction,  by estimation, is 25 to 30%. The left ventricle has severely decreased  function. Left ventricular  endocardial border not optimally defined to evaluate regional wall motion.  Left ventricular diastolic parameters are indeterminate.  2. Right ventricular systolic function is normal. The right ventricular  size is normal.  3. The mitral valve was not well visualized. Mild mitral valve  regurgitation.  4. The aortic valve was not well visualized. Aortic valve regurgitation  is not visualized.   Patient Profile     75 y.o. female with h/o CAD status post prior VF arrest and RCA stenting complicated by prior restenosis, chronic combined systolic diastolic congestive heart failure, hypertension, hyperlipidemia, ongoing tobacco abuse, COPD, anemia, and narcotic abuse on chronic Suboxone therapy, who was admitted 4/30 w/ resp failure & PNA req intubation, and transient ST elevation w/ subsequent rise in HsTrop to 2455. Echo 4/30 w/ new LV dysfxn and EF of 25-30%.  Assessment & Plan    Acute on chronic HFrEF: LVEF 25-30% during this admission.  CXR with bilateral opacities concerning for edema and/or multifocal PNA.  Patient started on milrinone yesterday for "blood pressure support."  Patient appears warm and well-perfused.  SvO2 this AM 79%.  Wean off milrinone.  I will decrease this to 0.125 mcg/kg/min now and repeat an SvO2 in 6 hours.  Continue  gentle diuresis.  Would aim for net negative fluid balance of 1-2L/24 hours as blood pressure tolerates.  Consider adding low-dose beta-blocker tomorrow if BP and HR tolerate after weaning milrinone today.  Add low-dose ACEI/ARB tomorrow if blood pressure tolerates.  NSTEMI: Troponin elevated in the setting of know CAD s/p PCI's to RCA.  Troponin trend plateaued on 4/30, most suggestive of supply-demand mismatch.  Heparin infusion completed.  Continue aspirin and clopidogrel.  Continue statin therapy.  Will need R/LHC once acute respiratory failure has resolved.  Acute respiratory failure with hypoxia, COPD, and pneumonia: Respiratory failure likely multifactorial, including PNA, COPD and  acute on chronic HFrEF.  Vent management per CCM.  ABX and steroids per CCM.  Continue gentle diuresis and HF management, as above.  For questions or updates, please contact [facility] Please consult [email or web address] for contact info under [facility].  CRITICAL CARE Performed by: Nelva Bush, MD  Total critical care time: 35 minutes  Critical care time was exclusive of separately billable procedures and treating other patients.  Critical care was necessary to treat or prevent imminent or life-threatening deterioration.  Critical care was time spent personally by me (independent of midlevel providers or residents) on the following activities: development of treatment plan with patient and/or surrogate as well as nursing, discussions with consultants, evaluation of patient's response to treatment, examination of patient, obtaining history from patient or surrogate, ordering and performing treatments and interventions, ordering and review of laboratory studies, ordering and review of radiographic studies, pulse oximetry and re-evaluation of patient's condition.  Signed, Nelva Bush, MD  09/13/2020, 8:29 AM

## 2020-09-13 NOTE — Consult Note (Signed)
Pharmacy Antibiotic Note  Kathryn Garrett is a 75 y.o. female with medical history including CAD sp stenting, CHF, COPD admitted on 09/10/2020 with pneumonia and sepsis. Pt was started on cefepime, doxycycline, and vancomycin. Vancomycin was stopped 5/2. Pharmacy has been consulted for cefepime dosing. 20,000 colonies/ml E coli from urine culture. Tracheal aspirate with rare Kleb pneumo (ampicillin resistant) and candida albicans.   D4 total IV abx; WBC 40.2>>18.1; procal 0.74>0.45; lactic acid 2.5>1.3  Plan: Continue Cefepime 2g q12H   Monitor renal function and adjust dose as clinically indicated  Height: 4\' 11"  (149.9 cm) Weight: 69.3 kg (152 lb 12.5 oz) IBW/kg (Calculated) : 43.2  Temp (24hrs), Avg:96.6 F (35.9 C), Min:96.08 F (35.6 C), Max:98.06 F (36.7 C)  Recent Labs  Lab 09/10/20 1417 09/10/20 1600 09/11/20 0437 09/11/20 1720 09/11/20 2011 09/12/20 0441 09/12/20 1159 09/12/20 1404 09/13/20 0439  WBC 40.2*  --  30.7*  --   --  27.8*  --   --  18.1*  CREATININE 0.89  --  0.87  --   --  0.72  --   --  0.76  LATICACIDVEN  --    < > 5.5* 2.5* 1.9  --  1.6 1.3  --    < > = values in this interval not displayed.    Estimated Creatinine Clearance: 51.4 mL/min (by C-G formula based on SCr of 0.76 mg/dL).    Allergies  Allergen Reactions  . Azithromycin Rash    Antimicrobials this admission: 4/29 cefepime >>  4/29 vancomycin >> 5/2 4/29 doxycycline >>   Dose adjustments this admission: None  Microbiology results: 4/29 BCx: NGTD  4/29 MRSA PCR: positive  4/29 Ucx: 20,000 colonies/ml E coli 4/30 Trach asp: rare Kleb pneumo (amp resistant), rare candida albicans  Thank you for allowing pharmacy to be a part of this patient's care.  Sherilyn Banker, PharmD Pharmacy Resident  09/13/2020 1:18 PM

## 2020-09-13 NOTE — Progress Notes (Signed)
NAME:  Kathryn Garrett, MRN:  [id number], DOB:  [birth year], LOS: 3 ADMISSION DATE:  09/10/2020, CONSULTATION DATE: 09/11/2020 REFERRING MD: Dr. Damita Dunnings, CHIEF COMPLAINT: Shortness of breath  History of Present Illness:  75 year old female presented to the [facility] ED from home with complaints of shortness of breath.  Patient reported 2 to 3 days of progressive shortness of breath and associated new onset nonproductive cough as well as a subjective fever.  Per ED documentation she denied associated chest pain/palpitations/diaphoresis.  And denies any recent nausea/vomiting, chills/rigors/myalgias.  Patient confirmed a history of COPD and current smoking history stating she is not on any supplemental oxygen at baseline.  ED course: Patient received cefepime due to concerns for a left lower lobe infiltrate, 500 mL NS bolus & IV fluids at 150 mL an hour Initial vitals: Afebrile at 98, tachypneic at 22, tachycardic at 108, BP 127/72 & 99% on BiPAP Significant labs: Serum bicarb 15, hyperglycemic at 350, troponin 70 > 745 > 1838, BNP 222.6, VBG revealed metabolic acidosis: 9.62/95/284/13.2, leukocytosis at 40.2, lactic acidosis 2.5> 2.2 > 2.5  TRH hospitalist were consulted for admission.  Around midnight PCCM was called due to patient's deteriorating respiratory status.  Per ED provider Dr. Karma Greaser and care RN the patient complained that " I can't breathe", was given a dose of Ativan and became somnolent on BiPAP.  Coarse crackles auscultated bilaterally, and the decision was made to emergently intubate the patient and place her on mechanical ventilation. PCCM consulted for management  Significant Hospital Events: Including procedures, antibiotic start and stop dates in addition to other pertinent events   . 09/10/2020-patient admits admitted with hospitalist service for COPD exacerbation and left lower lobe pneumonia . 09/11/2020-overnight patient had suspected flash pulmonary edema, dyspnea followed by  somnolence requiring emergent intubation and mechanical ventilation.  Admitted to ICU . 09/12/20- patient failed SBT , she had blood tinged secretions fromETT and aggitation, family was at bedside and we agreed on giving her more time and try again.   Interim History / Subjective:  Patient became agitated, tachypneic and tachycardic when sedation was turned off.  Last night she was started on milrinone infusion for systolic congestive heart failure This morning she is in A. fib with rapid ventricular response, heart rate reaching up to 150s  Objective   Blood pressure (!) 169/99, pulse (!) 128, temperature (!) 97.52 F (36.4 C), resp. rate 18, height 4\' 11"  (1.499 m), weight 69.3 kg, SpO2 94 %.    Vent Mode: PRVC FiO2 (%):  [30 %-40 %] 30 % Set Rate:  [18 bmp] 18 bmp Vt Set:  [450 mL] 450 mL PEEP:  [5 cmH20] 5 cmH20 Plateau Pressure:  [14 cmH20-19 cmH20] 16 cmH20   Intake/Output Summary (Last 24 hours) at 09/13/2020 4401 Last data filed at 09/13/2020 0272 Gross per 24 hour  Intake 2401.34 ml  Output 1400 ml  Net 1001.34 ml   Filed Weights   09/11/20 0346 09/12/20 0233 09/13/20 0500  Weight: 63.6 kg 63.3 kg 69.3 kg    Examination: General: Adult female, critically ill, lying in bed, orally intubated HEENT: MM pink/moist, anicteric, atraumatic, neck supple Neuro: Eyes closed, not following commands, moving all extremities except less movement was noted in right upper extremity CV: Irregularly irregular, tachycardic, no murmur Pulm: Bilateral faint crackles at bases, no wheezes or rhonchi GI: soft, rounded, non tender, bs x 4 Skin: Limited exam- no rashes/lesions noted Extremities: warm/dry, pulses + 2 R/P, trace edema noted BLE Skin: No rash  Labs/imaging that I have personally reviewed  (right click and "Reselect all SmartList Selections" daily)  Central venous oxygen 76% Procalcitonin 0.45 White count is trending down to 18 from 40  Assessment & Plan:  Acute hypoxic  respiratory failure  Sepsis due to left lung multifocal pneumonia Continue lung protective ventilation, patient was placed on a spontaneous breathing trial, she became tachypneic and tachycardic after stopping sedation Restarted on Precedex with RASS goal -1 Continue to hold propofol and fentanyl Continue IV antibiotics with cefepime and doxycycline as her white count has been trending down and she became afebrile Stop vancomycin Follow-up respiratory culture  Acute COPD exacerbation Continue nebs Continue IV steroid with Solu-Medrol 40 mg once daily  Acute systolic congestive heart failure Acute non-ST elevation MI New diagnosis of A. fib with RVR Patient's ejection fraction on echocardiogram is 30% Continue IV diuresis with Lasix Cardiology input is appreciated Taper off milrinone Patient completed therapy with IV heparin Continue aspirin Plavix and statin She will need left heart cath once stable She is diagnosed with new onset of atrial fibrillation, with rapid ventricular response Once milrinone is off we will start her on beta-blocker to see if she can tolerate  Acute metabolic/toxic encephalopathy Possible acute a stroke involving left MCA territory CT head was done on admission, it was read as negative but there is an area of suspicion involving left basal ganglia/internal capsule region We will get MRI brain Patient is on aspirin, Plavix and atorvastatin   Best practice (right click and "Reselect all SmartList Selections" daily)  Diet:  NPO Pain/Anxiety/Delirium protocol (if indicated): Yes (RASS goal -1) VAP protocol (if indicated): Yes DVT prophylaxis: SCD GI prophylaxis: H2B Glucose control:  SSI Yes Central venous access:, Needed Arterial line:  N/A Foley: Yes, discontinue today Mobility:  bed rest  PT consulted: N/A Code Status:  full code Disposition: ICU  Labs   CBC: Recent Labs  Lab 09/10/20 1417 09/11/20 0437 09/12/20 0441 09/13/20 0439  WBC  40.2* 30.7* 27.8* 18.1*  NEUTROABS  --  24.5*  --  13.8*  HGB 13.5 14.2 12.6 12.5  HCT 42.8 44.8 40.7 39.9  MCV 96.2 97.0 96.9 96.8  PLT 429* 324 353 951    Basic Metabolic Panel: Recent Labs  Lab 09/10/20 1417 09/10/20 1743 09/11/20 0437 09/12/20 0441 09/13/20 0439  NA 136  --  142 142 141  K 3.8  --  3.3* 4.3 3.7  CL 106  --  108 115* 112*  CO2 15*  --  19* 19* 19*  GLUCOSE 350*  --  182* 156* 104*  BUN 22  --  26* 31* 49*  CREATININE 0.89  --  0.87 0.72 0.76  CALCIUM 8.8*  --  8.2* 8.4* 8.7*  MG  --  2.4 2.1 2.1 2.3  PHOS  --   --  5.1* 3.9 3.9   GFR: Estimated Creatinine Clearance: 51.4 mL/min (by C-G formula based on SCr of 0.76 mg/dL). Recent Labs  Lab 09/10/20 1417 09/10/20 1600 09/10/20 1954 09/10/20 2050 09/11/20 0437 09/11/20 1720 09/11/20 2011 09/12/20 0441 09/12/20 1159 09/12/20 1404 09/13/20 0439  PROCALCITON  --   --  0.12  --   --   --   --   --  0.74  --  0.45  WBC 40.2*  --   --   --  30.7*  --   --  27.8*  --   --  18.1*  LATICACIDVEN  --    < >  --    < >  5.5* 2.5* 1.9  --  1.6 1.3  --    < > = values in this interval not displayed.    Liver Function Tests: Recent Labs  Lab 09/10/20 1417 09/11/20 0437 09/11/20 1720  AST 37 42* 23  ALT 42 58*  --   ALKPHOS 93 97  --   BILITOT 0.7 0.8  --   PROT 7.0 6.9  --   ALBUMIN 3.7 3.4*  --    No results for input(s): LIPASE, AMYLASE in the last 168 hours. No results for input(s): AMMONIA in the last 168 hours.  ABG    Component Value Date/Time   PHART 7.38 09/11/2020 1112   PCO2ART 35 09/11/2020 1112   PO2ART 86 09/11/2020 1112   HCO3 20.7 09/11/2020 1112   TCO2 23 01/14/2009 0327   ACIDBASEDEF 3.7 (H) 09/11/2020 1112   O2SAT 78.6 09/13/2020 0439     Coagulation Profile: Recent Labs  Lab 09/10/20 1743  INR 1.4*    Cardiac Enzymes: Recent Labs  Lab 09/11/20 1720  CKMB 26.6*    HbA1C: Hgb A1c MFr Bld  Date/Time Value Ref Range Status  09/11/2020 04:37 AM 6.4 (H) 4.8 -  5.6 % Final    Comment:    (NOTE) Pre diabetes:          5.7%-6.4%  Diabetes:              >6.4%  Glycemic control for   <7.0% adults with diabetes   10/02/2019 12:28 PM 6.4 (H) 4.8 - 5.6 % Final    Comment:             Prediabetes: 5.7 - 6.4          Diabetes: >6.4          Glycemic control for adults with diabetes: <7.0     CBG: Recent Labs  Lab 09/12/20 1538 09/12/20 1936 09/12/20 2349 09/13/20 0329 09/13/20 0712  GLUCAP 140* 234* 86 99 89    Total critical care time: 52 minutes  Performed by: [place] care time was exclusive of separately billable procedures and treating other patients.   Critical care was necessary to treat or prevent imminent or life-threatening deterioration.   Critical care was time spent personally by me on the following activities: development of treatment plan with patient and/or surrogate as well as nursing, discussions with consultants, evaluation of patient's response to treatment, examination of patient, obtaining history from patient or surrogate, ordering and performing treatments and interventions, ordering and review of laboratory studies, ordering and review of radiographic studies, pulse oximetry and re-evaluation of patient's condition.   Jacky Kindle MD [facility] See Amion for pager If no response to pager, please call [phone] until 7pm After 7pm, Please call E-link [phone]

## 2020-09-13 NOTE — Progress Notes (Signed)
Pt returned from MRI.  VSS.

## 2020-09-13 NOTE — Progress Notes (Signed)
Initial Nutrition Assessment  DOCUMENTATION CODES:   Obesity unspecified  INTERVENTION:   Initiate Vital HP @ 84ml/hr  Free water flushes 66ml q4 hours to maintain tube patency   Regimen provides 1200kcal/day, 105g/day protein and 1175ml/day free water   Liquid MVI daily via tube   NUTRITION DIAGNOSIS:   Inadequate oral intake related to inability to eat (pt sedated and ventilated) as evidenced by NPO status.  GOAL:   Provide needs based on ASPEN/SCCM guidelines  MONITOR:   Vent status,Labs,Weight trends,TF tolerance,Skin,I & O's  REASON FOR ASSESSMENT:   Ventilator    ASSESSMENT:   75 y/o female with h/o COPD, HTN, NSTEMI, CHF and lung mass who is admitted with PNA and possible acute a stroke involving left MCA territory   Pt sedated and ventilated. OGT in place. Plan is to start tube feeds today. Family at bedside reports pt with good appetite and oral intake at baseline. Per chart, pt appears weight stable at baseline.   Medications reviewed and include: aspirin, plavix, colace, insulin, synthroid, solu-medrol, miralax, cefepime, precedex, doxycycline, pepcid, fentanyl, propofol  Labs reviewed: K 3.7 wnl, BUN 49(H), P 3.9 wnl, Mg 2.3 wnl Wbc- 18.1(H)  Patient is currently intubated on ventilator support MV: 9.9 L/min Temp (24hrs), Avg:96.6 F (35.9 C), Min:96.08 F (35.6 C), Max:98.06 F (36.7 C)  Propofol: 3.8 ml/hr- provides 100kcal/day   MAP- >28mmHg  UOP- 1386ml  NUTRITION - FOCUSED PHYSICAL EXAM:  Flowsheet Row Most Recent Value  Orbital Region No depletion  Upper Arm Region No depletion  Thoracic and Lumbar Region No depletion  Buccal Region No depletion  Temple Region Moderate depletion  Clavicle Bone Region No depletion  Clavicle and Acromion Bone Region No depletion  Scapular Bone Region No depletion  Dorsal Hand No depletion  Patellar Region No depletion  Anterior Thigh Region No depletion  Posterior Calf Region No depletion  Edema  (RD Assessment) Moderate  Hair Reviewed  Eyes Reviewed  Mouth Reviewed  Skin Reviewed  Nails Reviewed     Diet Order:   Diet Order            Diet NPO time specified  Diet effective now                EDUCATION NEEDS:   No education needs have been identified at this time  Skin:  Skin Assessment: Reviewed RN Assessment  Last BM:  pta  Height:   Ht Readings from Last 1 Encounters:  09/11/20 4\' 11"  (1.499 m)    Weight:   Wt Readings from Last 1 Encounters:  09/13/20 69.3 kg    Ideal Body Weight:  44.5 kg  BMI:  Body mass index is 30.86 kg/m.  Estimated Nutritional Needs:   Kcal:  1279kcal/day  Protein:  100-110g/day  Fluid:  1.4L/day  [name] MS, RD, LDN Please refer to [facility] for RD and/or RD on-call/weekend/after hours pager

## 2020-09-14 ENCOUNTER — Inpatient Hospital Stay: Payer: Medicare HMO

## 2020-09-14 DIAGNOSIS — J9601 Acute respiratory failure with hypoxia: Secondary | ICD-10-CM | POA: Diagnosis not present

## 2020-09-14 DIAGNOSIS — I214 Non-ST elevation (NSTEMI) myocardial infarction: Secondary | ICD-10-CM | POA: Diagnosis not present

## 2020-09-14 DIAGNOSIS — I5020 Unspecified systolic (congestive) heart failure: Secondary | ICD-10-CM | POA: Diagnosis not present

## 2020-09-14 DIAGNOSIS — J441 Chronic obstructive pulmonary disease with (acute) exacerbation: Secondary | ICD-10-CM | POA: Diagnosis not present

## 2020-09-14 DIAGNOSIS — J189 Pneumonia, unspecified organism: Secondary | ICD-10-CM | POA: Diagnosis not present

## 2020-09-14 LAB — CBC WITH DIFFERENTIAL/PLATELET
Abs Immature Granulocytes: 0.22 K/uL — ABNORMAL HIGH (ref 0.00–0.07)
Basophils Absolute: 0 K/uL (ref 0.0–0.1)
Basophils Relative: 0 %
Eosinophils Absolute: 0 K/uL (ref 0.0–0.5)
Eosinophils Relative: 0 %
HCT: 36.5 % (ref 36.0–46.0)
Hemoglobin: 11.3 g/dL — ABNORMAL LOW (ref 12.0–15.0)
Immature Granulocytes: 2 %
Lymphocytes Relative: 16 %
Lymphs Abs: 1.8 K/uL (ref 0.7–4.0)
MCH: 30 pg (ref 26.0–34.0)
MCHC: 31 g/dL (ref 30.0–36.0)
MCV: 96.8 fL (ref 80.0–100.0)
Monocytes Absolute: 0.7 K/uL (ref 0.1–1.0)
Monocytes Relative: 7 %
Neutro Abs: 8.1 K/uL — ABNORMAL HIGH (ref 1.7–7.7)
Neutrophils Relative %: 75 %
Platelets: 215 K/uL (ref 150–400)
RBC: 3.77 MIL/uL — ABNORMAL LOW (ref 3.87–5.11)
RDW: 15.7 % — ABNORMAL HIGH (ref 11.5–15.5)
WBC: 10.9 K/uL — ABNORMAL HIGH (ref 4.0–10.5)
nRBC: 0.2 % (ref 0.0–0.2)

## 2020-09-14 LAB — LEGIONELLA PNEUMOPHILA SEROGP 1 UR AG: L. pneumophila Serogp 1 Ur Ag: NEGATIVE

## 2020-09-14 LAB — MAGNESIUM: Magnesium: 2.4 mg/dL (ref 1.7–2.4)

## 2020-09-14 LAB — BASIC METABOLIC PANEL
Anion gap: 9 (ref 5–15)
BUN: 66 mg/dL — ABNORMAL HIGH (ref 8–23)
CO2: 21 mmol/L — ABNORMAL LOW (ref 22–32)
Calcium: 8.6 mg/dL — ABNORMAL LOW (ref 8.9–10.3)
Chloride: 112 mmol/L — ABNORMAL HIGH (ref 98–111)
Creatinine, Ser: 0.72 mg/dL (ref 0.44–1.00)
GFR, Estimated: >60 mL/min (ref >60)
Glucose, Bld: 136 mg/dL — ABNORMAL HIGH (ref 70–99)
Potassium: 4 mmol/L (ref 3.5–5.1)
Sodium: 142 mmol/L (ref 135–145)

## 2020-09-14 LAB — GLUCOSE, CAPILLARY
Glucose-Capillary: 128 mg/dL — ABNORMAL HIGH (ref 70–99)
Glucose-Capillary: 177 mg/dL — ABNORMAL HIGH (ref 70–99)
Glucose-Capillary: 114 mg/dL — ABNORMAL HIGH (ref 70–99)
Glucose-Capillary: 177 mg/dL — ABNORMAL HIGH (ref 70–99)
Glucose-Capillary: 145 mg/dL — ABNORMAL HIGH (ref 70–99)
Glucose-Capillary: 128 mg/dL — ABNORMAL HIGH (ref 70–99)

## 2020-09-14 LAB — MYOGLOBIN, SERUM: Myoglobin: 44 ng/mL (ref 25–58)

## 2020-09-14 LAB — CYTOLOGY - NON PAP

## 2020-09-14 LAB — PHOSPHORUS: Phosphorus: 5.1 mg/dL — ABNORMAL HIGH (ref 2.5–4.6)

## 2020-09-14 IMAGING — DX DG CHEST 1V PORT
1 series · 1 of 1 positions shown · non-contrast
Comparison: Two days ago

CLINICAL DATA: Acute respiratory failure with hypoxia

EXAM:
PORTABLE CHEST 1 VIEW

[chest ap]
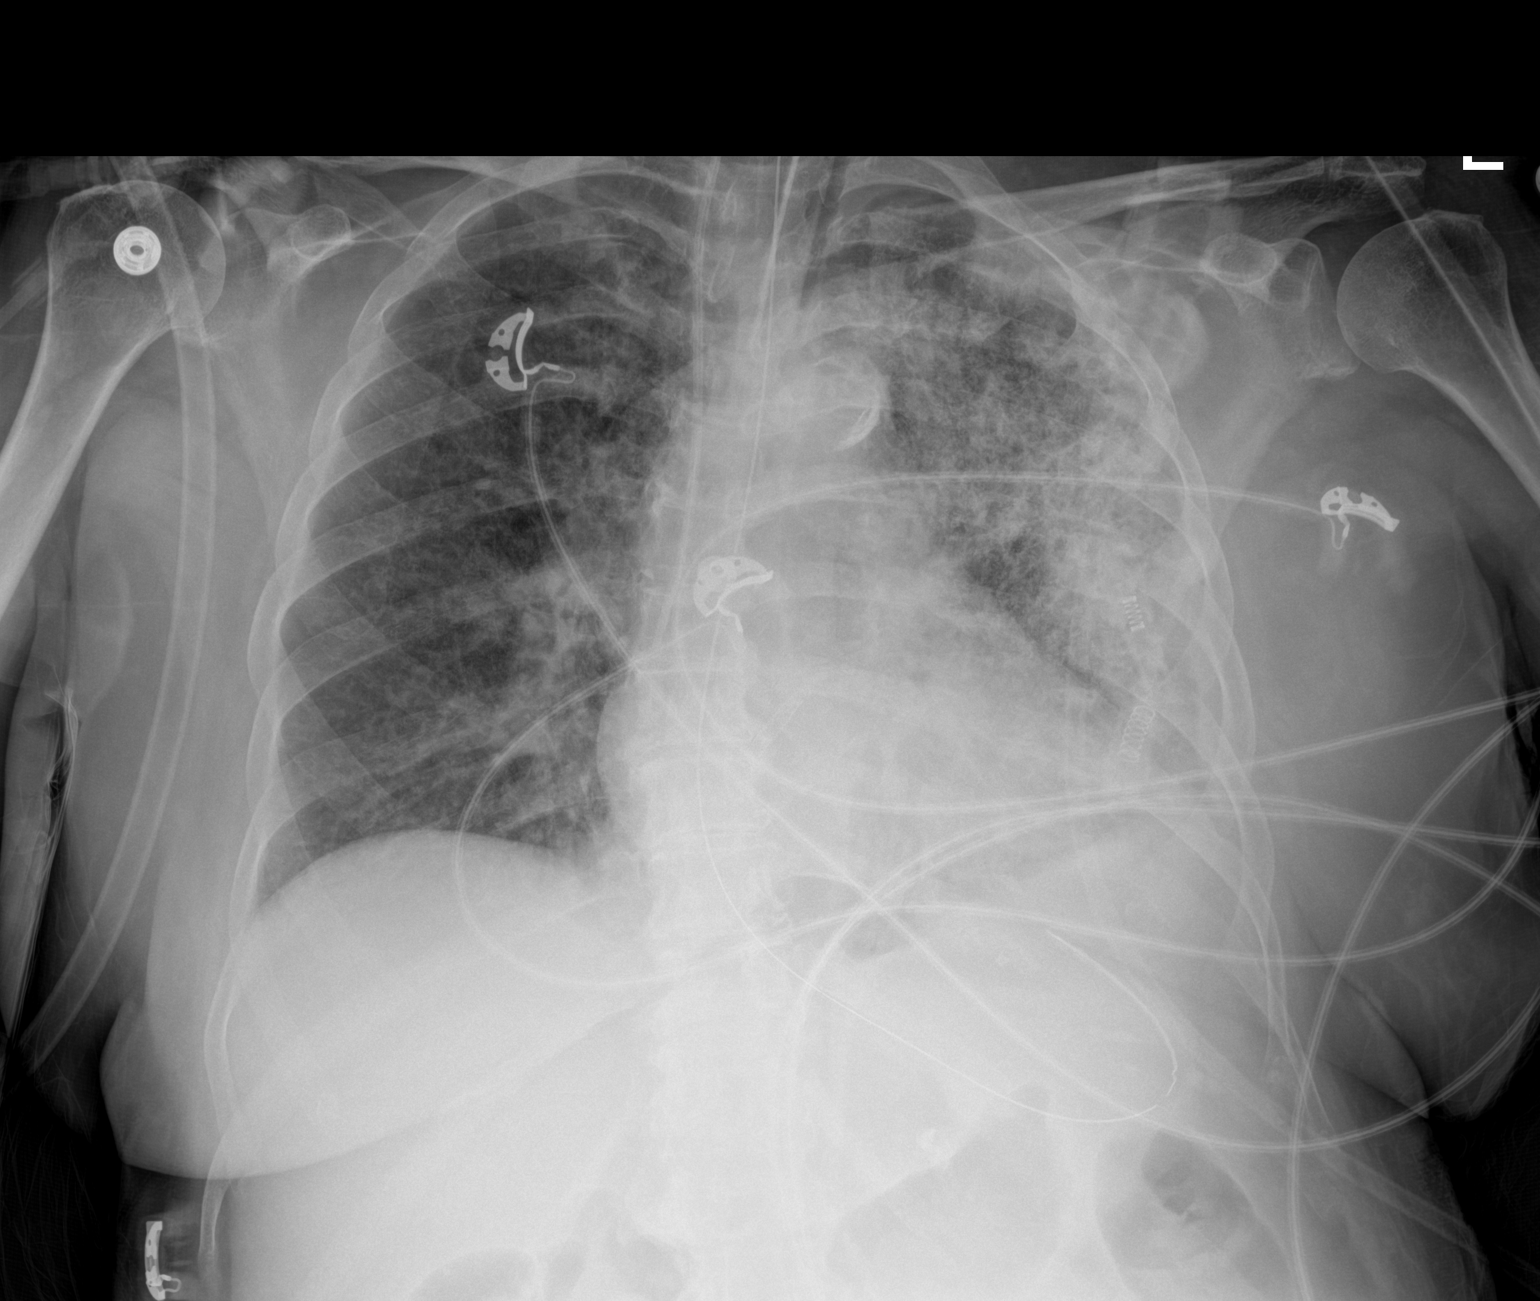

[1 of 1 positions shown; findings below may reference images not displayed]

FINDINGS: Airspace disease asymmetric to the left and progressed. No visible
effusion or pneumothorax.

Endotracheal tube with tip between the clavicular heads and carina.
Right IJ line with tip at the upper cavoatrial junction. Enteric
tube with tip at the stomach.

Normal heart size
IMPRESSION: 1. Worsening airspace disease on the left.
2. Unremarkable hardware.

## 2020-09-14 MED ORDER — FENTANYL 2500MCG IN NS 250ML (10MCG/ML) PREMIX INFUSION
25.0000 ug/h | INTRAVENOUS | Status: DC
  Administered 2020-09-14: 100 ug/h via INTRAVENOUS
  Administered 2020-09-16: 200 ug/h via INTRAVENOUS
  Filled 2020-09-14 (×4): qty 250

## 2020-09-14 MED ORDER — MIDAZOLAM HCL 2 MG/2ML IJ SOLN
2.0000 mg | INTRAMUSCULAR | Status: DC | PRN
  Administered 2020-09-14 – 2020-09-15 (×3): 2 mg via INTRAVENOUS
  Filled 2020-09-14 (×3): qty 2

## 2020-09-14 MED ORDER — FUROSEMIDE 10 MG/ML IJ SOLN
40.0000 mg | Freq: Once | INTRAMUSCULAR | Status: AC
  Administered 2020-09-14: 40 mg via INTRAVENOUS
  Filled 2020-09-14: qty 4

## 2020-09-14 MED ORDER — ROSUVASTATIN CALCIUM 10 MG PO TABS
20.0000 mg | ORAL_TABLET | Freq: Every day | ORAL | Status: DC
  Administered 2020-09-14 – 2020-09-17 (×4): 20 mg
  Filled 2020-09-14 (×4): qty 2

## 2020-09-14 MED ORDER — FENTANYL BOLUS VIA INFUSION
25.0000 ug | INTRAVENOUS | Status: DC | PRN
  Administered 2020-09-16: 100 ug via INTRAVENOUS
  Administered 2020-09-16 (×2): 50 ug via INTRAVENOUS
  Administered 2020-09-16: 100 ug via INTRAVENOUS
  Administered 2020-09-16: 50 ug via INTRAVENOUS
  Filled 2020-09-14: qty 100

## 2020-09-14 MED ORDER — CEFAZOLIN SODIUM-DEXTROSE 2-4 GM/100ML-% IV SOLN: 2.0000 g | Freq: Three times a day (TID) | INTRAVENOUS | Status: DC

## 2020-09-14 MED ORDER — METOPROLOL TARTRATE 25 MG PO TABS
12.5000 mg | ORAL_TABLET | Freq: Two times a day (BID) | ORAL | Status: DC
  Administered 2020-09-15 – 2020-09-16 (×3): 12.5 mg
  Filled 2020-09-14 (×4): qty 1

## 2020-09-14 MED ORDER — METOPROLOL TARTRATE 50 MG PO TABS: 50.0000 mg | ORAL_TABLET | Freq: Two times a day (BID) | ORAL | Status: DC

## 2020-09-14 MED ORDER — CEFAZOLIN SODIUM-DEXTROSE 2-4 GM/100ML-% IV SOLN
2.0000 g | Freq: Three times a day (TID) | INTRAVENOUS | Status: AC
  Administered 2020-09-14 – 2020-09-16 (×6): 2 g via INTRAVENOUS
  Filled 2020-09-14 (×6): qty 100

## 2020-09-14 MED ORDER — POTASSIUM CHLORIDE 20 MEQ PO PACK
20.0000 meq | PACK | Freq: Once | ORAL | Status: AC
  Administered 2020-09-14: 20 meq
  Filled 2020-09-14: qty 1

## 2020-09-14 MED ORDER — VECURONIUM BROMIDE 10 MG IV SOLR: 10.0000 mg | Freq: Once | INTRAVENOUS | Status: DC

## 2020-09-14 NOTE — Progress Notes (Signed)
Progress Note  Patient Name: Kathryn Garrett Date of Encounter: 09/14/2020  [facility] Cardiologist: Larae Grooms, MD   Subjective   Patient remains intubated and sedated.  She did not tolerate SBT well again today.  Inpatient Medications    Scheduled Meds: . aspirin  81 mg Per Tube QHS  . budesonide (PULMICORT) nebulizer solution  0.25 mg Nebulization BID  . chlorhexidine gluconate (MEDLINE KIT)  15 mL Mouth Rinse BID  . Chlorhexidine Gluconate Cloth  6 each Topical QHS  . clopidogrel  75 mg Per Tube QHS  . docusate  100 mg Per Tube BID  . fentaNYL (SUBLIMAZE) injection  25 mcg Intravenous Once  . fluticasone  1 spray Each Nare Daily  . free water  30 mL Per Tube Q4H  . insulin aspart  0-15 Units Subcutaneous Q4H  . ipratropium-albuterol  3 mL Nebulization Q6H  . levothyroxine  88 mcg Per Tube Q0600  . mouth rinse  15 mL Mouth Rinse 10 times per day  . methylPREDNISolone (SOLU-MEDROL) injection  40 mg Intravenous Daily  . metoprolol tartrate  50 mg Per Tube BID  . multivitamin  15 mL Per Tube Daily  . mupirocin ointment  1 application Nasal BID  . polyethylene glycol  17 g Per Tube Daily  . rosuvastatin  20 mg Per Tube Daily  . vecuronium  10 mg Intravenous Once   Continuous Infusions: . sodium chloride 20 mL/hr at 09/14/20 0910  .  ceFAZolin (ANCEF) IV 2 g (09/14/20 1737)  . dexmedetomidine (PRECEDEX) IV infusion 0.6 mcg/kg/hr (09/14/20 1500)  . famotidine (PEPCID) IV 20 mg (09/14/20 1040)  . feeding supplement (VITAL HIGH PROTEIN) 1,000 mL (09/14/20 1501)  . fentaNYL infusion INTRAVENOUS 100 mcg/hr (09/14/20 1502)  . propofol (DIPRIVAN) infusion 30 mcg/kg/min (09/14/20 1632)   PRN Meds: acetaminophen **OR** acetaminophen, docusate sodium, fentaNYL, metoprolol tartrate, midazolam, polyethylene glycol   Vital Signs    Vitals:   09/14/20 1200 09/14/20 1300 09/14/20 1400 09/14/20 1500  BP: (!) 106/54 106/62 (!) 92/48 121/60  Pulse: 98 (!) 102 (!) 103    Resp: 15 15 (!) 9 (!) 9  Temp:  99.5 F (37.5 C)    TempSrc:      SpO2: 95% 95% 99%   Weight:      Height:        Intake/Output Summary (Last 24 hours) at 09/14/2020 1743 Last data filed at 09/14/2020 1200 Gross per 24 hour  Intake 832.49 ml  Output 1150 ml  Net -317.51 ml   Last 3 Weights 09/14/2020 09/13/2020 09/12/2020  Weight (lbs) 150 lb 2.1 oz 152 lb 12.5 oz 139 lb 8.8 oz  Weight (kg) 68.1 kg 69.3 kg 63.3 kg      Telemetry    Normal sinus rhythm with PVCs - Personally Reviewed  ECG    No new tracing.  Physical Exam   GEN:  Intubated and sedated Neck:  Unable to assess JVP due to support devices and positioning. Cardiac:  Tachycardic but regular without murmurs. Respiratory:  Coarse breath sounds anteriorly. GI: Soft, nontender, non-distended  MS: No edema; No deformity. Neuro:   Intubated and sedated Psych: Intubated and sedated  Labs    High Sensitivity Troponin:   Recent Labs  Lab 09/10/20 2050 09/11/20 0437 09/11/20 1433 09/11/20 1611 09/11/20 2011  TROPONINIHS 1,838* 2,434* 2,265* 2,455* 2,317*      Chemistry Recent Labs  Lab 09/10/20 1417 09/11/20 0437 09/11/20 1720 09/12/20 0441 09/13/20 0439 09/14/20 0523  NA  136 142  --  142 141 142  K 3.8 3.3*  --  4.3 3.7 4.0  CL 106 108  --  115* 112* 112*  CO2 15* 19*  --  19* 19* 21*  GLUCOSE 350* 182*  --  156* 104* 136*  BUN 22 26*  --  31* 49* 66*  CREATININE 0.89 0.87  --  0.72 0.76 0.72  CALCIUM 8.8* 8.2*  --  8.4* 8.7* 8.6*  PROT 7.0 6.9  --   --   --   --   ALBUMIN 3.7 3.4*  --   --   --   --   AST 37 42* 23  --   --   --   ALT 42 58*  --   --   --   --   ALKPHOS 93 97  --   --   --   --   BILITOT 0.7 0.8  --   --   --   --   GFRNONAA >60 >60  --  >60 >60 >60  ANIONGAP 15 15  --  $R'8 10 9     'nl$ Hematology Recent Labs  Lab 09/12/20 0441 09/13/20 0439 09/14/20 0523  WBC 27.8* 18.1* 10.9*  RBC 4.20 4.12 3.77*  HGB 12.6 12.5 11.3*  HCT 40.7 39.9 36.5  MCV 96.9 96.8 96.8  MCH 30.0  30.3 30.0  MCHC 31.0 31.3 31.0  RDW 15.4 15.6* 15.7*  PLT 353 277 215    BNP Recent Labs  Lab 09/11/20 0437 09/11/20 1720 09/12/20 0441  BNP 1,009.8* 1,298.4* 970.2*     DDimer  Recent Labs  Lab 09/11/20 0437  DDIMER 3.47*     Radiology    MR BRAIN WO CONTRAST  Result Date: 09/13/2020 CLINICAL DATA:  Altered mental status.  Stroke presentation. EXAM: MRI HEAD WITHOUT CONTRAST TECHNIQUE: Multiplanar, multiecho pulse sequences of the brain and surrounding structures were obtained without intravenous contrast. COMPARISON:  Head CT 09/11/2020 FINDINGS: Brain: Diffusion imaging does not show any acute or subacute infarction. The brain shows generalized age related atrophy. No focal abnormality affects brainstem or cerebellum. Cerebral hemispheres show mild changes of chronic small vessel disease, fairly typical for age. No cortical or large vessel territory infarction. No mass lesion, hemorrhage, hydrocephalus or extra-axial collection. Vascular: Major vessels at the base of the brain show flow. Skull and upper cervical spine: Negative Sinuses/Orbits: No significant sinus inflammation. Bilateral mastoid effusions, often seen with mechanical ventilation. Orbits negative. Other: None IMPRESSION: No acute brain finding. Mild chronic small-vessel ischemic change of the white matter, fairly typical for age. Electronically Signed   By: Nelson Chimes M.D.   On: 09/13/2020 14:39   DG Chest Port 1 View  Result Date: 09/14/2020 CLINICAL DATA:  Acute respiratory failure with hypoxia EXAM: PORTABLE CHEST 1 VIEW COMPARISON:  Two days ago FINDINGS: Airspace disease asymmetric to the left and progressed. No visible effusion or pneumothorax. Endotracheal tube with tip between the clavicular heads and carina. Right IJ line with tip at the upper cavoatrial junction. Enteric tube with tip at the stomach. Normal heart size IMPRESSION: 1. Worsening airspace disease on the left. 2. Unremarkable hardware.  Electronically Signed   By: Monte Fantasia M.D.   On: 09/14/2020 10:49   DG Chest Port 1 View  Result Date: 09/12/2020 CLINICAL DATA:  Central line placement EXAM: PORTABLE CHEST 1 VIEW COMPARISON:  09/12/2020 at 4 a.m. FINDINGS: Single frontal view of the chest demonstrates interval placement of a right internal  jugular catheter tip overlying the atriocaval junction. Endotracheal tube overlies tracheal air column tip midway between thoracic inlet and carina. Enteric catheter tip projects over the gastric fundus. Cardiac silhouette is stable. Continued left greater than right ground-glass airspace disease. Small left effusion again noted. No pneumothorax. IMPRESSION: 1. No complication after central venous catheter placement. 2. Bilateral left greater than right ground-glass airspace disease worrisome for multifocal pneumonia. 3. Stable trace left effusion. Electronically Signed   By: Randa Ngo M.D.   On: 09/12/2020 22:39    Cardiac Studies   TTE (09/11/2020): 1. Recommend limited echo with iv contrast agent (definity) for addequate  assessment of LVEF and wall motion.. Left ventricular ejection fraction,  by estimation, is 25 to 30%. The left ventricle has severely decreased  function. Left ventricular  endocardial border not optimally defined to evaluate regional wall motion.  Left ventricular diastolic parameters are indeterminate.  2. Right ventricular systolic function is normal. The right ventricular  size is normal.  3. The mitral valve was not well visualized. Mild mitral valve  regurgitation.  4. The aortic valve was not well visualized. Aortic valve regurgitation  is not visualized.   Patient Profile     75 y.o. female with h/o CAD status post prior VF arrest and RCA stenting complicated by prior restenosis, chronic combined systolic diastolic congestive heart failure, hypertension, hyperlipidemia, ongoing tobacco abuse, COPD, anemia, and narcotic abuse on chronic Suboxone  therapy, who was admitted 4/30 w/ resp failure & PNA req intubation, and transient ST elevation w/ subsequent rise in HsTrop to 2455. Echo 4/30 w/ new LV dysfxn and EF of 25-30%.  Assessment & Plan    Acute on chronic HFrEF: Patient net -1.3 L yesterday and successfully weaned from milrinone.  Renal function is stable.  Continue gentle diuresis.  Metoprolol tartrate 50 mg twice daily ordered by primary service; we will decrease this to 12.5 mg daily in order to minimize risk for acute heart failure decompensation from excessive beta-blockade in the setting of severely reduced LVEF.  Coronary artery disease and NSTEMI: Unable to assess symptoms.  Troponin quite elevated on presentation.  This may reflect supply-demand mismatch in the setting of known CAD and acute medical illness.  IV heparin has been completed.  Continue aspirin and clopidogrel.  Add beta-blocker, as above.  Continue statin therapy.  Once respiratory status has improved, patient will likely need catheterization to redefine coronary anatomy.  Acute respiratory failure with hypoxia: Likely multifactorial including pneumonia, COPD exacerbation, and heart failure.  Gentle diuresis.  Antimicrobial and COPD therapy per CCM.   For questions or updates, please contact [place] Please consult [email or web address] for contact info under [facility].     Signed, Nelva Bush, MD  09/14/2020, 5:43 PM

## 2020-09-14 NOTE — Progress Notes (Addendum)
0730 spontaneous breathing trial- Patients respiratory rate 47, heart rate 168,pulse ox. down to 88%. B/P 208/108. Patient very agitated. Suctioned x 5 by nurse for copious amounts of blood tinged tan sputum. Daughters in room during SBT. All actions explained to daughters. Started on Fentanyl IV and bolused with Fentanyl 100 mcg x 2,Versed 2 mg and Propfol increased to 30 mcg/kg/min. Finally patient calmed down. B/P down to 140/70 respiratory rate 26 and heart rate down to 110. 0845 Dr. Tacy Learn assessed patient. Explained that NP ,RTand nurse had already done SBT a few minutes ago. Verbal orders given to d/c Propfol infusion. Heart rate up to 168,respiratory rate 47 and B/P 240/124. Patient very agitated. Verbal order to increase Fentanyl drip to 200 mcg/min. Per verbal order. Heart rate and respiratory rate  Still increased heart rate,rr rate and b/p. After no improvement patient was resedated and placed back on her original ventilator settings. Daughter extremely upset about 2nd SBT per Dr. Tacy Learn.

## 2020-09-14 NOTE — Progress Notes (Signed)
NAME:  Kathryn Garrett, MRN:  [id number], DOB:  Oct 20, 1945, LOS: 4 ADMISSION DATE:  09/10/2020, CONSULTATION DATE: 09/11/2020 REFERRING MD: Dr. Damita Dunnings, CHIEF COMPLAINT: Shortness of breath  History of Present Illness:  75 year old female presented to the [facility] ED from home with complaints of shortness of breath.  Patient reported 2 to 3 days of progressive shortness of breath and associated new onset nonproductive cough as well as a subjective fever.  Per ED documentation she denied associated chest pain/palpitations/diaphoresis.  And denies any recent nausea/vomiting, chills/rigors/myalgias.  Patient confirmed a history of COPD and current smoking history stating she is not on any supplemental oxygen at baseline.  ED course: Patient received cefepime due to concerns for a left lower lobe infiltrate, 500 mL NS bolus & IV fluids at 150 mL an hour Initial vitals: Afebrile at 98, tachypneic at 22, tachycardic at 108, BP 127/72 & 99% on BiPAP Significant labs: Serum bicarb 15, hyperglycemic at 350, troponin 70 > 745 > 1838, BNP 222.6, VBG revealed metabolic acidosis: 3.81/84/037/54.3, leukocytosis at 40.2, lactic acidosis 2.5> 2.2 > 2.5  [facility] hospitalist were consulted for admission.  Around midnight PCCM was called due to patient's deteriorating respiratory status.  Per ED provider Dr. Karma Greaser and care RN the patient complained that " I can't breathe", was given a dose of Ativan and became somnolent on BiPAP.  Coarse crackles auscultated bilaterally, and the decision was made to emergently intubate the patient and place her on mechanical ventilation. PCCM consulted for management  Significant Hospital Events: Including procedures, antibiotic start and stop dates in addition to other pertinent events   . 09/10/2020-patient admits admitted with hospitalist service for COPD exacerbation and left lower lobe pneumonia . 09/11/2020-overnight patient had suspected flash pulmonary edema, dyspnea followed by  somnolence requiring emergent intubation and mechanical ventilation.  Admitted to ICU . 09/12/20- patient failed SBT , she had blood tinged secretions fromETT and aggitation, family was at bedside and we agreed on giving her more time and try again.  . 09/14/20- Failed SBT (became hypoxic with sats mid 80's, increased WOB and assessory muscle use, HR 140-160's). Will diurese today and add Metoprolol.  Tracheal aspirate from 4/30 with KLEBSIELLA PNEUMONIAE (resistant to Ampicillin)    Interim History / Subjective:  -No acute events reported overnight -Afebrile, hemodynamically stable, NO vasopressors -MRI Brain yesterday negative -Failed SBT this morning (became hypoxic with sats mid 80's, increased WOB and assessory muscle use, HR 140-160's). -Will diurese today with 40 mg IV Lasix, add Metoprolol   Objective   Blood pressure 134/72, pulse 76, temperature (!) 97.1 F (36.2 C), temperature source Axillary, resp. rate 18, height _0  (1.499 m), weight 68.1 kg, SpO2 91 %.    Vent Mode: PRVC FiO2 (%):  [30 %-50 %] 50 % Set Rate:  [15 bmp-18 bmp] 15 bmp Vt Set:  [450 mL] 450 mL PEEP:  [5 cmH20] 5 cmH20   Intake/Output Summary (Last 24 hours) at 09/14/2020 0849 Last data filed at 09/14/2020 0600 Gross per 24 hour  Intake 1194.9 ml  Output 2500 ml  Net -1305.1 ml   Filed Weights   09/12/20 0233 09/13/20 0500 09/14/20 0500  Weight: 63.3 kg 69.3 kg 68.1 kg    Examination: General: Critically ill appearing female, laying in bed, intubated/sedated HEENT: Atraumatic, normocephalic, neck supple, no JVD, pupils PERRLA Neuro: Lightly sedated, does not follow commands CV: Tachycardia, regular rhythm, S1-S2, no murmurs, rubs, gallops, 2+ distal pulses Pulm: Coarse crackles throughout, no wheezing noted, overbreathing the  ventilator, tachypnea, even GI: Soft, nondistended, nontender, no guarding or rebound tenderness, bowel sounds positive x4 Skin: Limited exam-warm and dry.  No obvious rashes,  lesions, ulcerations Extremities: Warm and dry.  1+ edema bilateral lower extremities, no deformities   Labs/imaging that I have personally reviewed  (right click and "Reselect all SmartList Selections" daily)  Labs 09/14/2020: Bicarb 21, glucose 136, BUN 66, creatinine 0.72, WBC 10.9, hemoglobin 11.3 Tracheal aspirate from 4/30 w/ KLEBSIELLA PNEUMONIAE (resistant to Ampicillin) MRI brain 09/13/2020:No acute brain finding. Mild chronic small-vessel ischemic change of the white matter, fairly typical for age.  Assessment & Plan:  Acute hypoxic respiratory failure secondary to Acute COPD Exacerbation & left lung multifocal pneumonia -Full vent support, continue lung protective ventilation -Wean FiO2 and PEEP as tolerated to maintain O2 sats 88 to 92% -Follow intermittent chest x-ray and ABG as needed -Spontaneous breathing trials when respiratory parameters met -Implement VAP bundle -Bronchodilators -IV (Solu-Medrol 40 mg daily) and inhaled Steroids -Continue Cefepime and Doxycycline   Sepsis due to left lung multifocal pneumonia>> tracheal aspirate on 4/30 with KLEBSIELLA PNEUMONIAE (resistant to Ampicillin) -Monitor fever curve -Trend WBCs -Follow cultures as above -Continue cefepime and doxycycline  Acute systolic congestive heart failure Acute non-ST elevation MI New diagnosis of A. fib with RVR -Continuous cardiac monitoring -Patient's ejection fraction on echocardiogram is 30% -Continue IV diuresis with Lasix>>will give 40 mg IV Lasix x1 today 5/3 -Cardiology following, input is appreciated -Milrinone has been tapered off -Patient completed therapy with IV heparin -Continue aspirin Plavix and statin -She will need left heart cath once stable -Start Metoprolol 50 mg PO BID  Acute metabolic/toxic encephalopathy Possible acute a stroke involving left MCA territory -Maintain RASS goal of 0 to -1 -Fentanyl, Propofol, and Precedex as needed  -Avoid sedating meds as able -Daily  wake up assessment -CT Head at admission negative -MRI Brain 09/13/20 negative -Continue Aspirin, Plavix, Atorvastatin -Provide supportive care    Best practice (right click and "Reselect all SmartList Selections" daily)  Diet:  NPO Pain/Anxiety/Delirium protocol (if indicated): Yes (RASS goal -1) VAP protocol (if indicated): Yes DVT prophylaxis: SCD GI prophylaxis: H2B Glucose control:  SSI Yes Central venous access:, Needed Arterial line:  N/A Foley: N/A Mobility:  bed rest  PT consulted: N/A Code Status:  full code Disposition: ICU  Updated pt's 2 daughters at beside 09/14/20.  They were present at bedside during failed SBT.  Labs   CBC: Recent Labs  Lab 09/10/20 1417 09/11/20 0437 09/12/20 0441 09/13/20 0439 09/14/20 0523  WBC 40.2* 30.7* 27.8* 18.1* 10.9*  NEUTROABS  --  24.5*  --  13.8* 8.1*  HGB 13.5 14.2 12.6 12.5 11.3*  HCT 42.8 44.8 40.7 39.9 36.5  MCV 96.2 97.0 96.9 96.8 96.8  PLT 429* 324 353 277 269    Basic Metabolic Panel: Recent Labs  Lab 09/10/20 1417 09/10/20 1743 09/11/20 0437 09/12/20 0441 09/13/20 0439 09/14/20 0523  NA 136  --  142 142 141 142  K 3.8  --  3.3* 4.3 3.7 4.0  CL 106  --  108 115* 112* 112*  CO2 15*  --  19* 19* 19* 21*  GLUCOSE 350*  --  182* 156* 104* 136*  BUN 22  --  26* 31* 49* 66*  CREATININE 0.89  --  0.87 0.72 0.76 0.72  CALCIUM 8.8*  --  8.2* 8.4* 8.7* 8.6*  MG  --  2.4 2.1 2.1 2.3 2.4  PHOS  --   --  5.1* 3.9  3.9 5.1*   GFR: Estimated Creatinine Clearance: 51 mL/min (by C-G formula based on SCr of 0.72 mg/dL). Recent Labs  Lab 09/10/20 1954 09/10/20 2050 09/11/20 0437 09/11/20 1720 09/11/20 2011 09/12/20 0441 09/12/20 1159 09/12/20 1404 09/13/20 0439 09/14/20 0523  PROCALCITON 0.12  --   --   --   --   --  0.74  --  0.45  --   WBC  --   --  30.7*  --   --  27.8*  --   --  18.1* 10.9*  LATICACIDVEN  --    < > 5.5* 2.5* 1.9  --  1.6 1.3  --   --    < > = values in this interval not displayed.     Liver Function Tests: Recent Labs  Lab 09/10/20 1417 09/11/20 0437 09/11/20 1720  AST 37 42* 23  ALT 42 58*  --   ALKPHOS 93 97  --   BILITOT 0.7 0.8  --   PROT 7.0 6.9  --   ALBUMIN 3.7 3.4*  --    No results for input(s): LIPASE, AMYLASE in the last 168 hours. No results for input(s): AMMONIA in the last 168 hours.  ABG    Component Value Date/Time   PHART 7.38 09/11/2020 1112   PCO2ART 35 09/11/2020 1112   PO2ART 86 09/11/2020 1112   HCO3 20.7 09/11/2020 1112   TCO2 23 01/14/2009 0327   ACIDBASEDEF 3.7 (H) 09/11/2020 1112   O2SAT 78.1 09/13/2020 1716     Coagulation Profile: Recent Labs  Lab 09/10/20 1743  INR 1.4*    Cardiac Enzymes: Recent Labs  Lab 09/11/20 1720  CKMB 26.6*    HbA1C: Hgb A1c MFr Bld  Date/Time Value Ref Range Status  09/11/2020 04:37 AM 6.4 (H) 4.8 - 5.6 % Final    Comment:    (NOTE) Pre diabetes:          5.7%-6.4%  Diabetes:              >6.4%  Glycemic control for   <7.0% adults with diabetes   10/02/2019 12:28 PM 6.4 (H) 4.8 - 5.6 % Final    Comment:             Prediabetes: 5.7 - 6.4          Diabetes: >6.4          Glycemic control for adults with diabetes: <7.0     CBG: Recent Labs  Lab 09/13/20 1622 09/13/20 1922 09/13/20 2355 09/14/20 0336 09/14/20 0713  GLUCAP 170* 161* 141* 128* 177*      Darel Hong, AGACNP-BC [facility] Prefer epic messenger for cross cover needs If after hours, please call E-link

## 2020-09-14 NOTE — Progress Notes (Signed)
Patient remained sedated after SBT.  She remains in ST.as well. No new issues this PM

## 2020-09-14 NOTE — Progress Notes (Addendum)
Pharmacy Electrolyte Monitoring Consult:  Pharmacy consulted to assist in monitoring and replacing electrolytes in this 75 y.o. female admitted on 09/10/2020 with Respiratory Distress  Labs:  Sodium (mmol/L)  Date Value  09/14/2020 142  10/02/2019 141  03/23/2013 138   Potassium (mmol/L)  Date Value  09/14/2020 4.0  03/23/2013 4.2   Magnesium (mg/dL)  Date Value  09/14/2020 2.4  03/20/2013 2.0   Phosphorus (mg/dL)  Date Value  09/14/2020 5.1 (H)   Calcium (mg/dL)  Date Value  09/14/2020 8.6 (L)   Calcium, Total (mg/dL)  Date Value  03/23/2013 9.0   Albumin (g/dL)  Date Value  09/11/2020 3.4 (L)  10/02/2019 4.2  03/19/2013 3.3 (L)    Assessment: Patient is a 75 y/o F with medical history including CAD s/p stenting, CHF, COPD who is admitted with COPD exacerbation / pneumonia / decompensated heart failure / NSTEMI. Pharmacy has been consulted to assist with electrolyte monitoring and replacement as indicated.   Vital High Protein @50mL /hr  Goals of therapy: K 4-5.1 mmol/L Mg 2-2.4 mg/dL All other electrolytes within normal limits  Plan --K 4.0, however pt receiving 1x dose IV lasix - will give 69mEq KCl packet  --Phos 5.1 - will continue to monitor, if continues to increase tomorrow can consider phos binder  --Follow-up electrolytes with AM labs tomorrow  Sherilyn Banker, PharmD Pharmacy Resident  09/14/2020 6:39 AM

## 2020-09-15 ENCOUNTER — Inpatient Hospital Stay: Payer: Medicare HMO

## 2020-09-15 DIAGNOSIS — I214 Non-ST elevation (NSTEMI) myocardial infarction: Secondary | ICD-10-CM | POA: Diagnosis not present

## 2020-09-15 DIAGNOSIS — J441 Chronic obstructive pulmonary disease with (acute) exacerbation: Secondary | ICD-10-CM | POA: Diagnosis not present

## 2020-09-15 DIAGNOSIS — I5020 Unspecified systolic (congestive) heart failure: Secondary | ICD-10-CM | POA: Diagnosis not present

## 2020-09-15 LAB — BASIC METABOLIC PANEL
Anion gap: 4 — ABNORMAL LOW (ref 5–15)
BUN: 62 mg/dL — ABNORMAL HIGH (ref 8–23)
CO2: 25 mmol/L (ref 22–32)
Calcium: 8.3 mg/dL — ABNORMAL LOW (ref 8.9–10.3)
Chloride: 114 mmol/L — ABNORMAL HIGH (ref 98–111)
Creatinine, Ser: 0.62 mg/dL (ref 0.44–1.00)
GFR, Estimated: >60 mL/min (ref >60)
Glucose, Bld: 120 mg/dL — ABNORMAL HIGH (ref 70–99)
Potassium: 4.5 mmol/L (ref 3.5–5.1)
Sodium: 143 mmol/L (ref 135–145)

## 2020-09-15 LAB — GLUCOSE, CAPILLARY
Glucose-Capillary: 132 mg/dL — ABNORMAL HIGH (ref 70–99)
Glucose-Capillary: 104 mg/dL — ABNORMAL HIGH (ref 70–99)
Glucose-Capillary: 142 mg/dL — ABNORMAL HIGH (ref 70–99)
Glucose-Capillary: 175 mg/dL — ABNORMAL HIGH (ref 70–99)
Glucose-Capillary: 143 mg/dL — ABNORMAL HIGH (ref 70–99)
Glucose-Capillary: 126 mg/dL — ABNORMAL HIGH (ref 70–99)

## 2020-09-15 LAB — CULTURE, BLOOD (ROUTINE X 2)
Culture: NO GROWTH
Culture: NO GROWTH
Special Requests: ADEQUATE
Special Requests: ADEQUATE

## 2020-09-15 LAB — TRIGLYCERIDES: Triglycerides: 183 mg/dL — ABNORMAL HIGH (ref <150)

## 2020-09-15 LAB — CBC
HCT: 35 % — ABNORMAL LOW (ref 36.0–46.0)
Hemoglobin: 10.7 g/dL — ABNORMAL LOW (ref 12.0–15.0)
MCH: 30.1 pg (ref 26.0–34.0)
MCHC: 30.6 g/dL (ref 30.0–36.0)
MCV: 98.3 fL (ref 80.0–100.0)
Platelets: 218 10*3/uL (ref 150–400)
RBC: 3.56 MIL/uL — ABNORMAL LOW (ref 3.87–5.11)
RDW: 16.2 % — ABNORMAL HIGH (ref 11.5–15.5)
WBC: 13.6 10*3/uL — ABNORMAL HIGH (ref 4.0–10.5)
nRBC: 0 % (ref 0.0–0.2)

## 2020-09-15 LAB — MAGNESIUM: Magnesium: 2.4 mg/dL (ref 1.7–2.4)

## 2020-09-15 LAB — PHOSPHORUS: Phosphorus: 4.1 mg/dL (ref 2.5–4.6)

## 2020-09-15 LAB — LEGIONELLA PNEUMOPHILA TOTAL AB: Legionella Pneumo Total Ab: <0.91 OD ratio (ref 0.00–0.90)

## 2020-09-15 IMAGING — DX DG CHEST 1V PORT
1 series · 1 of 1 positions shown · non-contrast
Comparison: Radiograph [DATE], CT [DATE]

CLINICAL DATA: Acute respiratory failure with hypoxia

EXAM:
PORTABLE CHEST 1 VIEW

[chest ap]
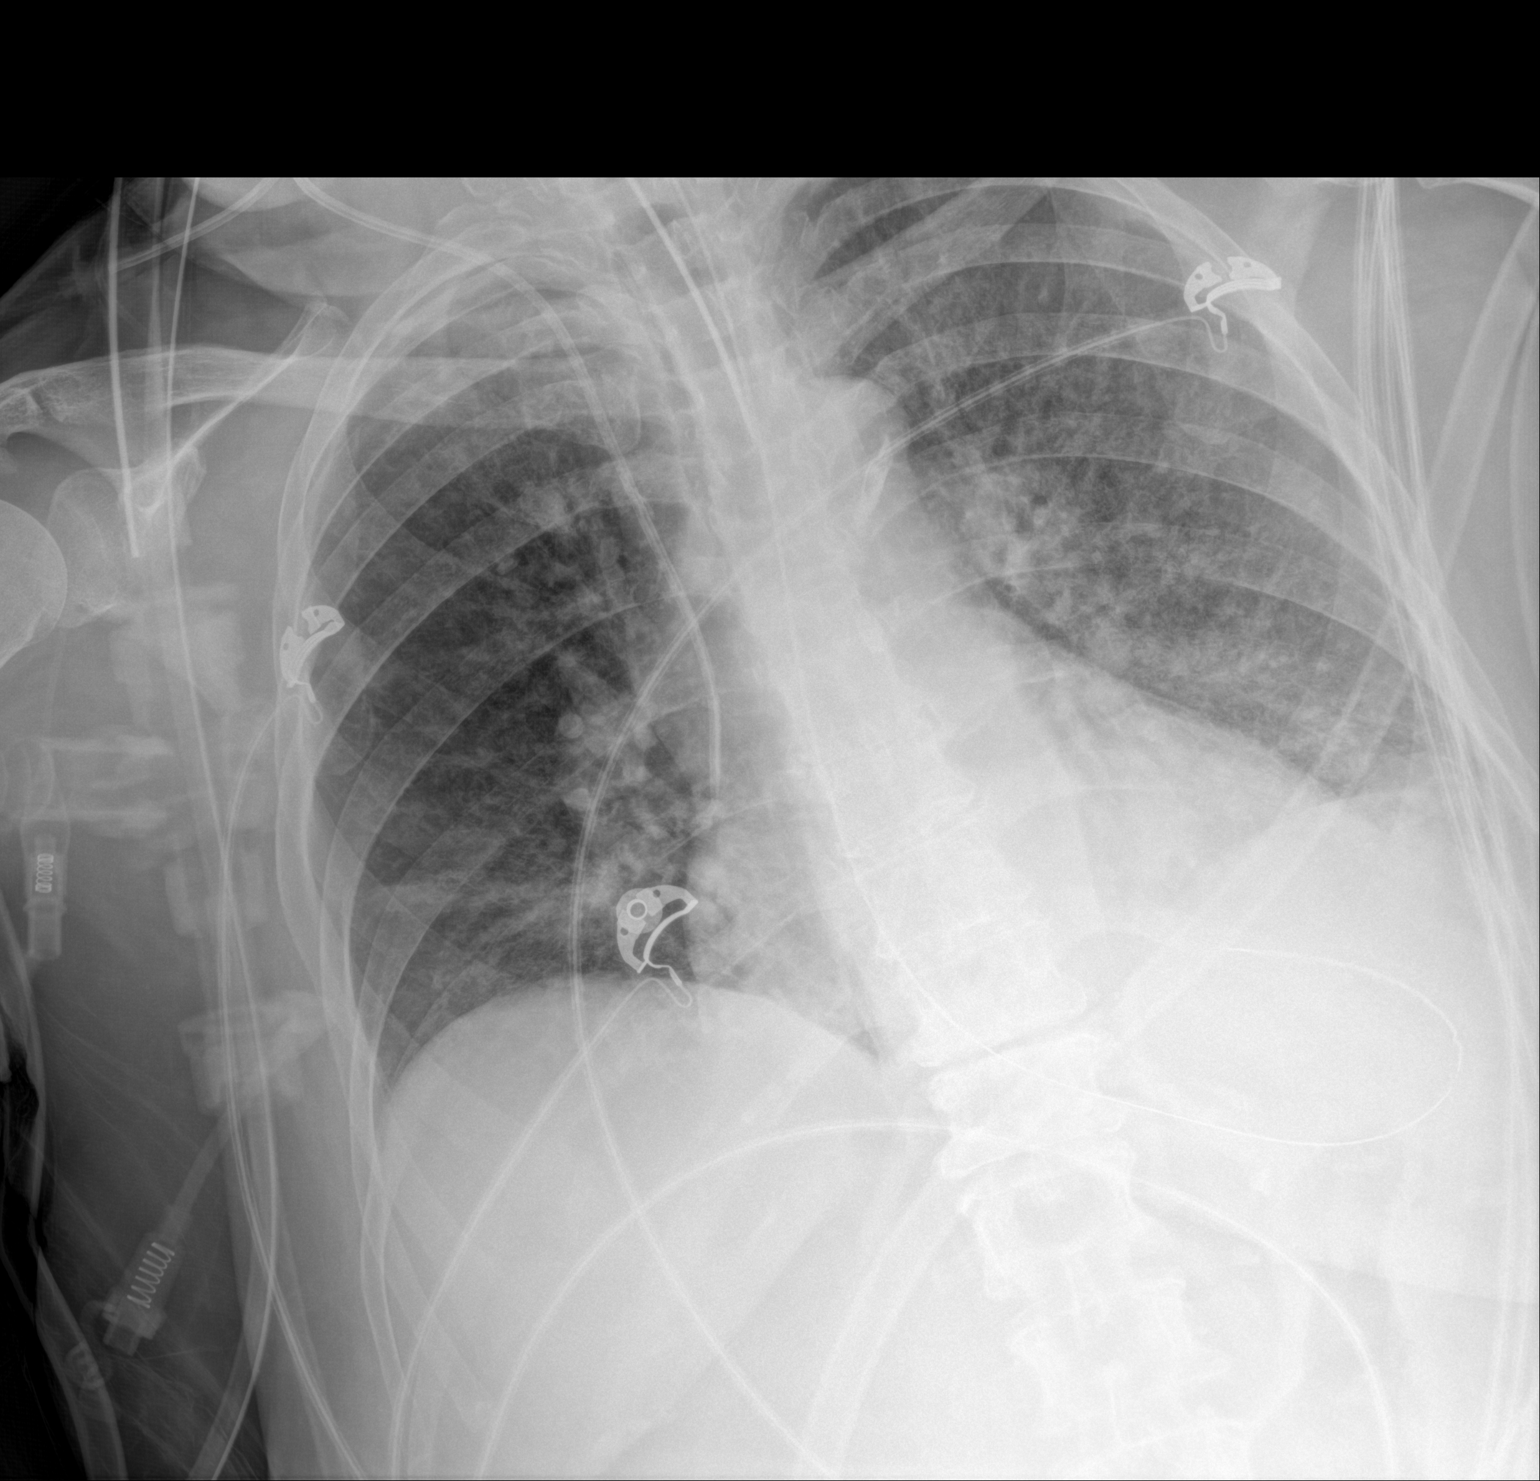

[1 of 1 positions shown; findings below may reference images not displayed]

FINDINGS: *Endotracheal tube tip terminates in the mid trachea, approximately
3.2 cm from the expected location of the carina.
*Transesophageal tube tip and side-port coiled in the left upper
quadrant, distal to the GE junction.
*A right IJ catheter tip terminates near the superior cavoatrial
junction.
*Stable cardiomediastinal contours with a calcified aorta.
*Telemetry leads and external support devices overlie the chest.

Redemonstration of the heterogeneous opacities throughout much of
the left hemithorax and the right lung base as well, overall
slightly improved from comparison exam, particular in the periphery
of the left lung. No pneumothorax. Suspect trace left effusion. No
visible right effusion. Stable cardiomediastinal contours with a
calcified aorta. No acute osseous or soft tissue abnormality.
Degenerative changes are present in the imaged spine and shoulders.
IMPRESSION: Lines and tubes as above.

Bilateral heterogeneous opacities in the lungs with fairly
significant interval clearing of the left lung periphery.

Trace residual left effusion.

Aortic Atherosclerosis ([2L]-[2L]).

## 2020-09-15 MED ORDER — CLONAZEPAM 0.5 MG PO TABS
0.5000 mg | ORAL_TABLET | Freq: Two times a day (BID) | ORAL | Status: DC
  Administered 2020-09-15 – 2020-09-17 (×5): 0.5 mg
  Filled 2020-09-15 (×5): qty 1

## 2020-09-15 MED ORDER — FUROSEMIDE 10 MG/ML IJ SOLN
40.0000 mg | Freq: Every day | INTRAMUSCULAR | Status: DC
  Administered 2020-09-16 – 2020-09-20 (×5): 40 mg via INTRAVENOUS
  Filled 2020-09-15 (×5): qty 4

## 2020-09-15 MED ORDER — FUROSEMIDE 10 MG/ML IJ SOLN
40.0000 mg | Freq: Once | INTRAMUSCULAR | Status: AC
  Administered 2020-09-15: 40 mg via INTRAVENOUS
  Filled 2020-09-15: qty 4

## 2020-09-15 MED ORDER — CLONAZEPAM 0.1 MG/ML ORAL SUSPENSION: 0.5000 mg | Freq: Two times a day (BID) | ORAL | Status: DC

## 2020-09-15 MED ORDER — NOREPINEPHRINE 4 MG/250ML-% IV SOLN
INTRAVENOUS | Status: AC
  Filled 2020-09-15: qty 250

## 2020-09-15 MED ORDER — OXYCODONE HCL 5 MG PO TABS
5.0000 mg | ORAL_TABLET | Freq: Four times a day (QID) | ORAL | Status: DC
  Administered 2020-09-15 – 2020-09-16 (×4): 5 mg
  Filled 2020-09-15 (×4): qty 1

## 2020-09-15 MED ORDER — FREE WATER
30.0000 mL | Status: DC
  Administered 2020-09-15 – 2020-09-17 (×13): 30 mL

## 2020-09-15 NOTE — Progress Notes (Signed)
Progress Note  Patient Name: Kathryn Garrett Date of Encounter: 09/15/2020  [facility] Cardiologist: Larae Grooms, MD   Subjective   Intubated and sedated. Family at bedside. Some sinus tach and elevated BP this AM, resolved now.  Kidney function normal.   Inpatient Medications    Scheduled Meds: . aspirin  81 mg Per Tube QHS  . budesonide (PULMICORT) nebulizer solution  0.25 mg Nebulization BID  . chlorhexidine gluconate (MEDLINE KIT)  15 mL Mouth Rinse BID  . Chlorhexidine Gluconate Cloth  6 each Topical QHS  . clonazePAM  0.5 mg Per Tube BID  . clopidogrel  75 mg Per Tube QHS  . docusate  100 mg Per Tube BID  . fentaNYL (SUBLIMAZE) injection  25 mcg Intravenous Once  . fluticasone  1 spray Each Nare Daily  . free water  30 mL Per Tube Q4H  . insulin aspart  0-15 Units Subcutaneous Q4H  . ipratropium-albuterol  3 mL Nebulization Q6H  . levothyroxine  88 mcg Per Tube Q0600  . mouth rinse  15 mL Mouth Rinse 10 times per day  . methylPREDNISolone (SOLU-MEDROL) injection  40 mg Intravenous Daily  . metoprolol tartrate  12.5 mg Per Tube BID  . multivitamin  15 mL Per Tube Daily  . mupirocin ointment  1 application Nasal BID  . polyethylene glycol  17 g Per Tube Daily  . rosuvastatin  20 mg Per Tube Daily  . vecuronium  10 mg Intravenous Once   Continuous Infusions: . sodium chloride 20 mL/hr at 09/14/20 0910  .  ceFAZolin (ANCEF) IV 2 g (09/15/20 0904)  . dexmedetomidine (PRECEDEX) IV infusion 0.8 mcg/kg/hr (09/15/20 0600)  . famotidine (PEPCID) IV 20 mg (09/15/20 7989)  . feeding supplement (VITAL HIGH PROTEIN) 1,000 mL (09/15/20 0857)  . fentaNYL infusion INTRAVENOUS 100 mcg/hr (09/15/20 0600)  . propofol (DIPRIVAN) infusion 25 mcg/kg/min (09/15/20 0600)   PRN Meds: acetaminophen **OR** acetaminophen, docusate sodium, fentaNYL, metoprolol tartrate, midazolam, polyethylene glycol   Vital Signs    Vitals:   09/15/20 0538 09/15/20 0600 09/15/20 0803  09/15/20 0815  BP:  (!) 104/57  (!) 152/82  Pulse:   80 (!) 149  Resp:  14 19   Temp:      TempSrc:      SpO2: 97%  100%   Weight:      Height:        Intake/Output Summary (Last 24 hours) at 09/15/2020 0943 Last data filed at 09/15/2020 0600 Gross per 24 hour  Intake 2782.66 ml  Output 2175 ml  Net 607.66 ml   Last 3 Weights 09/15/2020 09/14/2020 09/13/2020  Weight (lbs) 150 lb 9.2 oz 150 lb 2.1 oz 152 lb 12.5 oz  Weight (kg) 68.3 kg 68.1 kg 69.3 kg      Telemetry    SR/ST, HR 80-120s - Personally Reviewed  ECG    No new - Personally Reviewed  Physical Exam   GEN: Sedated and intubated Neck: No JVD Cardiac: RRR, no murmurs, rubs, or gallops.  Respiratory: Course breath sounds GI: Soft, nontender, non-distended  MS: No edema; No deformity. Neuro:  Nonfocal  Psych: Normal affect   Labs    High Sensitivity Troponin:   Recent Labs  Lab 09/10/20 2050 09/11/20 0437 09/11/20 1433 09/11/20 1611 09/11/20 2011  TROPONINIHS 1,838* 2,434* 2,265* 2,455* 2,317*      Chemistry Recent Labs  Lab 09/10/20 1417 09/11/20 0437 09/11/20 1720 09/12/20 0441 09/13/20 0439 09/14/20 0523 09/15/20 0552  NA 136  142  --    < > 141 142 143  K 3.8 3.3*  --    < > 3.7 4.0 4.5  CL 106 108  --    < > 112* 112* 114*  CO2 15* 19*  --    < > 19* 21* 25  GLUCOSE 350* 182*  --    < > 104* 136* 120*  BUN 22 26*  --    < > 49* 66* 62*  CREATININE 0.89 0.87  --    < > 0.76 0.72 0.62  CALCIUM 8.8* 8.2*  --    < > 8.7* 8.6* 8.3*  PROT 7.0 6.9  --   --   --   --   --   ALBUMIN 3.7 3.4*  --   --   --   --   --   AST 37 42* 23  --   --   --   --   ALT 42 58*  --   --   --   --   --   ALKPHOS 93 97  --   --   --   --   --   BILITOT 0.7 0.8  --   --   --   --   --   GFRNONAA >60 >60  --    < > >60 >60 >60  ANIONGAP 15 15  --    < > 10 9 4*   < > = values in this interval not displayed.     Hematology Recent Labs  Lab 09/13/20 0439 09/14/20 0523 09/15/20 0552  WBC 18.1* 10.9* 13.6*   RBC 4.12 3.77* 3.56*  HGB 12.5 11.3* 10.7*  HCT 39.9 36.5 35.0*  MCV 96.8 96.8 98.3  MCH 30.3 30.0 30.1  MCHC 31.3 31.0 30.6  RDW 15.6* 15.7* 16.2*  PLT 277 215 218    BNP Recent Labs  Lab 09/11/20 0437 09/11/20 1720 09/12/20 0441  BNP 1,009.8* 1,298.4* 970.2*     DDimer  Recent Labs  Lab 09/11/20 0437  DDIMER 3.47*     Radiology    MR BRAIN WO CONTRAST  Result Date: 09/13/2020 CLINICAL DATA:  Altered mental status.  Stroke presentation. EXAM: MRI HEAD WITHOUT CONTRAST TECHNIQUE: Multiplanar, multiecho pulse sequences of the brain and surrounding structures were obtained without intravenous contrast. COMPARISON:  Head CT 09/11/2020 FINDINGS: Brain: Diffusion imaging does not show any acute or subacute infarction. The brain shows generalized age related atrophy. No focal abnormality affects brainstem or cerebellum. Cerebral hemispheres show mild changes of chronic small vessel disease, fairly typical for age. No cortical or large vessel territory infarction. No mass lesion, hemorrhage, hydrocephalus or extra-axial collection. Vascular: Major vessels at the base of the brain show flow. Skull and upper cervical spine: Negative Sinuses/Orbits: No significant sinus inflammation. Bilateral mastoid effusions, often seen with mechanical ventilation. Orbits negative. Other: None IMPRESSION: No acute brain finding. Mild chronic small-vessel ischemic change of the white matter, fairly typical for age. Electronically Signed   By: Nelson Chimes M.D.   On: 09/13/2020 14:39   DG Chest Port 1 View  Result Date: 09/15/2020 CLINICAL DATA:  Acute respiratory failure with hypoxia EXAM: PORTABLE CHEST 1 VIEW COMPARISON:  Radiograph 09/14/2020, CT 09/11/2020 FINDINGS: *Endotracheal tube tip terminates in the mid trachea, approximately 3.2 cm from the expected location of the carina. *Transesophageal tube tip and side-port coiled in the left upper quadrant, distal to the GE junction. *A right IJ catheter  tip terminates near  the superior cavoatrial junction. *Stable cardiomediastinal contours with a calcified aorta. *Telemetry leads and external support devices overlie the chest. Redemonstration of the heterogeneous opacities throughout much of the left hemithorax and the right lung base as well, overall slightly improved from comparison exam, particular in the periphery of the left lung. No pneumothorax. Suspect trace left effusion. No visible right effusion. Stable cardiomediastinal contours with a calcified aorta. No acute osseous or soft tissue abnormality. Degenerative changes are present in the imaged spine and shoulders. IMPRESSION: Lines and tubes as above. Bilateral heterogeneous opacities in the lungs with fairly significant interval clearing of the left lung periphery. Trace residual left effusion. Aortic Atherosclerosis (ICD10-I70.0). Electronically Signed   By: Lovena Le M.D.   On: 09/15/2020 03:48   DG Chest Port 1 View  Result Date: 09/14/2020 CLINICAL DATA:  Acute respiratory failure with hypoxia EXAM: PORTABLE CHEST 1 VIEW COMPARISON:  Two days ago FINDINGS: Airspace disease asymmetric to the left and progressed. No visible effusion or pneumothorax. Endotracheal tube with tip between the clavicular heads and carina. Right IJ line with tip at the upper cavoatrial junction. Enteric tube with tip at the stomach. Normal heart size IMPRESSION: 1. Worsening airspace disease on the left. 2. Unremarkable hardware. Electronically Signed   By: Monte Fantasia M.D.   On: 09/14/2020 10:49    Cardiac Studies   TTE (09/11/2020): 1. Recommend limited echo with iv contrast agent (definity) for addequate  assessment of LVEF and wall motion.. Left ventricular ejection fraction,  by estimation, is 25 to 30%. The left ventricle has severely decreased  function. Left ventricular  endocardial border not optimally defined to evaluate regional wall motion.  Left ventricular diastolic parameters are  indeterminate.  2. Right ventricular systolic function is normal. The right ventricular  size is normal.  3. The mitral valve was not well visualized. Mild mitral valve  regurgitation.  4. The aortic valve was not well visualized. Aortic valve regurgitation  is not visualized.    Patient Profile     75 y.o. female with h/o CAD s/p prior VF arrest and RCA stenting complicated by prior IRS, chronic combined heart failure, HTN, HLD, ongoing tobacco use, COPD, anemia, narcotic abuse on suboxone admitted 4/30 with respiratory failure & PNA req intubation and transient ST elevation with troponin elevation to 2455. Echo showed new los LV dysfunction EF 25-30%.   Assessment & Plan    Acute on chronic HFrEF - LVEF 25-30%, mild MR - weaned from milrinone - Bps stable - IV lasix 11m to keep Net 0, appears relatively euvolemic on exam - kidney function stable - continue BB   NSTEMI CAD with prior stenting/ISR Elevated troponin - 48 hours of IV heparin completed - still intubated and sedated, plan for LHC when stable from a respiratory standpoint - continue aspirin, plavix, BB, statin  Acute respiratory failure with hypoxia - multifactorial with PNA with sepsis, COPD and CHF - IV lasix as needed - abx per IM - per CCM  For questions or updates, please contact [facility] Please consult [email or web address] for contact info under        Signed, Alexandrina Fiorini HNinfa Meeker PA-C  09/15/2020, 9:43 AM

## 2020-09-15 NOTE — Progress Notes (Signed)
Patient remained stable throughout the night. Fentanyl 100 mcg/hr, Precedex 0.8 mcg/kg/hr & Propofol 25 mcg/kg/min infusing without incident. Vitals WNL.

## 2020-09-15 NOTE — Progress Notes (Signed)
Pharmacy Electrolyte Monitoring Consult:  Pharmacy consulted to assist in monitoring and replacing electrolytes in this 75 y.o. female admitted on 09/10/2020 with Respiratory Distress  Labs:  Sodium (mmol/L)  Date Value  09/15/2020 143  10/02/2019 141  03/23/2013 138   Potassium (mmol/L)  Date Value  09/15/2020 4.5  03/23/2013 4.2   Magnesium (mg/dL)  Date Value  09/15/2020 2.4  03/20/2013 2.0   Phosphorus (mg/dL)  Date Value  09/15/2020 4.1   Calcium (mg/dL)  Date Value  09/15/2020 8.3 (L)   Calcium, Total (mg/dL)  Date Value  03/23/2013 9.0   Albumin (g/dL)  Date Value  09/11/2020 3.4 (L)  10/02/2019 4.2  03/19/2013 3.3 (L)    Assessment: Patient is a 75 y/o F with medical history including CAD s/p stenting, CHF, COPD who is admitted with COPD exacerbation / pneumonia / decompensated heart failure / NSTEMI. Pharmacy has been consulted to assist with electrolyte monitoring and replacement as indicated.   Vital High Protein @50mL /hr Phos 5.1>4.1  Goals of therapy: K 4-5.1 mmol/L Mg 2-2.4 mg/dL All other electrolytes within normal limits  Plan --Electrolytes WNL, no replacement needed at this time --Follow-up electrolytes with AM labs tomorrow  Sherilyn Banker, PharmD Pharmacy Resident  09/15/2020 7:16 AM

## 2020-09-15 NOTE — Progress Notes (Signed)
     Referral received for Kathryn Garrett :goals of care discussion. Chart reviewed and updates received from RN. Patient assessed, remains intubated and sedated.   I spoke with patient's daughter, Kathryn Garrett and introduced myself and role of palliative. Kathryn Garrett became upset and tearful in hearing palliative stating family is not interested in giving up and her mother is not there yet. Emotional support provided. I was able to explain my role and she was more accepting to our involvement. Kathryn Garrett states she would like to speak with her siblings (sister and brother) and explain our involvement and see if they would be open and available to come in for a family meeting. She has my contact information and plans to give me a call back later today or tomorrow morning with a specified time.   PMT will re-attempt to contact family at a later time/date. Detailed note and recommendations to follow once GOC has been completed.   Thank you for your referral and allowing PMT to assist in Mrs. Kathryn Garrett's care.   Alda Lea, AGPCNP-BC Palliative Medicine Team  Phone: [phone] Pager: [phone] Amion: N. Cousar   NO CHARGE

## 2020-09-15 NOTE — Plan of Care (Signed)
Pt with unstable hemodynamics after approx 30 minutes off propofol and < 1 minute SBT this AM.  RR 40s, HR 140s, BP 200/100.  Pt agonal breathing on vent, flushed and diaphoretic.  Precedex and fentanyl remained on during this time.  Pt seemed to take several hours to recover from this brief WUA and SBT.  She would appear to be peaceful w/ relatively stable VS and suddenly she would tach into 130s and RR would increase with pronounced agonal breaths on vent, even though sedation had been restarted and vent was in [facility] mode.  O2 sats low as 79 during this time so FIO2 was eventually increased to 60% Grandson and his wife at bedside most of this shift.  Updated on POC, understanding verbalized. Pt responded well to lasix, Per Dr Loanne Drilling precedex was increased to 1.8 mcg, and propofol was titrated down from 25 mcgs to 5 mcgs.  Pt appears comfortable at this time and VSS with FIO2 back at 30%.

## 2020-09-15 NOTE — Progress Notes (Signed)
NAME:  Kathryn Garrett, MRN:  [id number], DOB:  12-Nov-1945, LOS: 5 ADMISSION DATE:  09/10/2020, CONSULTATION DATE: 09/11/2020 REFERRING MD: Dr. Damita Dunnings, CHIEF COMPLAINT: Shortness of breath  History of Present Illness:  75 year old female presented to the [facility] ED from home with complaints of shortness of breath.  Patient reported 2 to 3 days of progressive shortness of breath and associated new onset nonproductive cough as well as a subjective fever.  Per ED documentation she denied associated chest pain/palpitations/diaphoresis.  And denies any recent nausea/vomiting, chills/rigors/myalgias.  Patient confirmed a history of COPD and current smoking history stating she is not on any supplemental oxygen at baseline.  ED course: Patient received cefepime due to concerns for a left lower lobe infiltrate, 500 mL NS bolus & IV fluids at 150 mL an hour Initial vitals: Afebrile at 98, tachypneic at 22, tachycardic at 108, BP 127/72 & 99% on BiPAP Significant labs: Serum bicarb 15, hyperglycemic at 350, troponin 70 > 745 > 1838, BNP 222.6, VBG revealed metabolic acidosis: 3.15/40/086/76.1, leukocytosis at 40.2, lactic acidosis 2.5> 2.2 > 2.5  TRH hospitalist were consulted for admission.  Around midnight PCCM was called due to patient's deteriorating respiratory status.  Per ED provider Dr. Karma Greaser and care RN the patient complained that " I can't breathe", was given a dose of Ativan and became somnolent on BiPAP.  Coarse crackles auscultated bilaterally, and the decision was made to emergently intubate the patient and place her on mechanical ventilation. PCCM consulted for management  Significant Hospital Events: Including procedures, antibiotic start and stop dates in addition to other pertinent events   . 09/10/2020-patient admits admitted with hospitalist service for COPD exacerbation and left lower lobe pneumonia . 09/11/2020-overnight patient had suspected flash pulmonary edema, dyspnea followed by  somnolence requiring emergent intubation and mechanical ventilation.  Admitted to ICU . 09/12/20- patient failed SBT , she had blood tinged secretions fromETT and aggitation, family was at bedside and we agreed on giving her more time and try again.  . 09/14/20- Failed SBT (became hypoxic with sats mid 80's, increased WOB and assessory muscle use, HR 140-160's). Will diurese today and add Metoprolol.  Tracheal aspirate from 4/30 with KLEBSIELLA PNEUMONIAE (resistant to Ampicillin)   . 09/15/20- Failed SBT (increased WOB and assessory muscle use, RR 40's, HR 140-150's, Hypertensive); plan to add scheduled PO Klonopin and Oxycodone, Lasix 40 mg IV x1, consult Palliative Care  Interim History / Subjective:  -No acute events reported overnight -Afebrile, hemodynamically stable, NO vasopressors -Diuresed well yesterday, urine output 2.1L over last 24 hrs (+ 1.4L since admit), Creatinine remains stable @ 0.62 -Will diurese today with 40 mg IV Lasix x1 -Failed SBT today (tachypnea with RR 40's, accessory and abdominal muscle use, HR 150's, Hypertensive) -Plan to add PO Klonopin and Oxycodone  -Will consult Palliative Care to assist with goals of care  Objective   Blood pressure 118/64, pulse (!) 103, temperature 100.04 F (37.8 C), resp. rate 18, height $RemoveBe'4\' 11"'vzbFenbPw$  (1.499 m), weight 68.3 kg, SpO2 96 %.    Vent Mode: PRVC FiO2 (%):  [30 %-60 %] 40 % Set Rate:  [15 bmp] 15 bmp Vt Set:  [500 mL] 500 mL PEEP:  [5 cmH20] 5 cmH20 Plateau Pressure:  [24 cmH20] 24 cmH20   Intake/Output Summary (Last 24 hours) at 09/15/2020 1135 Last data filed at 09/15/2020 1000 Gross per 24 hour  Intake 3981.63 ml  Output 1575 ml  Net 2406.63 ml   Filed Weights   09/13/20 0500  09/14/20 0500 09/15/20 0500  Weight: 69.3 kg 68.1 kg 68.3 kg    Examination: General: Critically ill appearing female, laying in bed, intubated/sedated HEENT: Atraumatic, normocephalic, neck supple, no JVD, pupils PERRLA Neuro: Sedation vacation in  progress, will nod to questions, unable to perform full Neuro exam due to respiratory distress requiring re-sedation, pupils PERRLA CV: Tachycardia, regular rhythm, S1-S2, no murmurs, rubs, gallops, 2+ distal pulses Pulm: Mechanical breath sounds, no wheezing or rhonchi noted, overbreathing the ventilator at times, even GI: Soft, nondistended, nontender, no guarding or rebound tenderness, bowel sounds positive x4 Skin: Limited exam-warm and dry.  No obvious rashes, lesions, ulcerations Extremities: Warm and dry.  1+ edema bilateral lower extremities, no deformities   Labs/imaging that I have personally reviewed  (right click and "Reselect all SmartList Selections" daily)  Labs 09/15/2020: Glucose 120, BUN 62, creatinine 0.62, triglycerides 183, WBC 13.6, hemoglobin 10.7, hematocrit 35 ABG: pH 7.38/PCO2 41/PO2 123/bicarb 24.3 Tracheal aspirate from 4/30 w/ KLEBSIELLA PNEUMONIAE (resistant to Ampicillin) Chest x-ray 09/15/2020>>Redemonstration of the heterogeneous opacities throughout much of the left hemithorax and the right lung base as well, overall slightly improved from comparison exam, particular in the periphery of the left lung. No pneumothorax. Suspect trace left effusion. No visible right effusion. Stable cardiomediastinal contours with a calcified aorta. No acute osseous or soft tissue abnormality. Degenerative changes are present in the imaged spine and shoulders. MRI brain 09/13/2020:No acute brain finding. Mild chronic small-vessel ischemic change of the white matter, fairly typical for age.  Assessment & Plan:  Acute hypoxic respiratory failure secondary to Acute COPD Exacerbation & left lung multifocal pneumonia -Full vent support, continue lung protective ventilation -Wean FiO2 and PEEP as tolerated to maintain O2 sats 88 to 92% -Follow intermittent chest x-ray and ABG as needed -Spontaneous breathing trials when respiratory parameters met ~ failed SBT today 5/4 -Implement VAP  bundle -Bronchodilators -IV (Solu-Medrol 40 mg daily) and inhaled Steroids -Continue Cefazolin   Sepsis due to left lung multifocal pneumonia>> tracheal aspirate on 4/30 with KLEBSIELLA PNEUMONIAE (resistant to Ampicillin) -Monitor fever curve -Trend WBCs -Follow cultures as above -Continue Cefazolin (plan for 7 day total course of ABX)  Acute systolic congestive heart failure Acute non-ST elevation MI New diagnosis of A. fib with RVR -Continuous cardiac monitoring -Patient's ejection fraction on echocardiogram is 30% -Diuresis as renal function and BP permits >>will give 40 mg IV Lasix x1 today 5/4 -Cardiology following, input is appreciated -Milrinone has been tapered off -Patient completed therapy with IV heparin -Continue aspirin Plavix and statin -She will need left heart cath once stable -Continue Metoprolol 12.5 mg PO BID  Acute metabolic/toxic encephalopathy Sedation needs in setting of Mechanical Ventilation -Maintain RASS goal of 0 to -1 -Fentanyl, Propofol, and Precedex as needed  -Will add scheduled Klonopin and Oxycodone -Daily wake up assessment -CT Head at admission negative -MRI Brain 09/13/20 negative -Continue Aspirin, Plavix, Atorvastatin -Provide supportive care    Best practice (right click and "Reselect all SmartList Selections" daily)  Diet:  NPO, tube feeds Pain/Anxiety/Delirium protocol (if indicated): Yes (RASS goal -1) VAP protocol (if indicated): Yes DVT prophylaxis: SCD (chemical prophylaxis held due to previous hemoptysis) GI prophylaxis: H2B Glucose control:  SSI Yes Central venous access:, Needed Arterial line:  N/A Foley: N/A Mobility:  bed rest  PT consulted: N/A Code Status:  full code Disposition: ICU  Updated pt's grandson at bedside 09/15/20.  He was present at bedside during failed SBT.  Labs   CBC: Recent Labs  Lab 09/11/20  3159 09/12/20 0441 09/13/20 0439 09/14/20 0523 09/15/20 0552  WBC 30.7* 27.8* 18.1* 10.9* 13.6*   NEUTROABS 24.5*  --  13.8* 8.1*  --   HGB 14.2 12.6 12.5 11.3* 10.7*  HCT 44.8 40.7 39.9 36.5 35.0*  MCV 97.0 96.9 96.8 96.8 98.3  PLT 324 353 277 215 458    Basic Metabolic Panel: Recent Labs  Lab 09/11/20 0437 09/12/20 0441 09/13/20 0439 09/14/20 0523 09/15/20 0552  NA 142 142 141 142 143  K 3.3* 4.3 3.7 4.0 4.5  CL 108 115* 112* 112* 114*  CO2 19* 19* 19* 21* 25  GLUCOSE 182* 156* 104* 136* 120*  BUN 26* 31* 49* 66* 62*  CREATININE 0.87 0.72 0.76 0.72 0.62  CALCIUM 8.2* 8.4* 8.7* 8.6* 8.3*  MG 2.1 2.1 2.3 2.4 2.4  PHOS 5.1* 3.9 3.9 5.1* 4.1   GFR: Estimated Creatinine Clearance: 51 mL/min (by C-G formula based on SCr of 0.62 mg/dL). Recent Labs  Lab 09/10/20 1954 09/10/20 2050 09/11/20 1720 09/11/20 2011 09/12/20 0441 09/12/20 1159 09/12/20 1404 09/13/20 0439 09/14/20 0523 09/15/20 0552  PROCALCITON 0.12  --   --   --   --  0.74  --  0.45  --   --   WBC  --    < >  --   --  27.8*  --   --  18.1* 10.9* 13.6*  LATICACIDVEN  --    < > 2.5* 1.9  --  1.6 1.3  --   --   --    < > = values in this interval not displayed.    Liver Function Tests: Recent Labs  Lab 09/10/20 1417 09/11/20 0437 09/11/20 1720  AST 37 42* 23  ALT 42 58*  --   ALKPHOS 93 97  --   BILITOT 0.7 0.8  --   PROT 7.0 6.9  --   ALBUMIN 3.7 3.4*  --    No results for input(s): LIPASE, AMYLASE in the last 168 hours. No results for input(s): AMMONIA in the last 168 hours.  ABG    Component Value Date/Time   PHART 7.38 09/15/2020 0500   PCO2ART 41 09/15/2020 0500   PO2ART 123 (H) 09/15/2020 0500   HCO3 24.3 09/15/2020 0500   TCO2 23 01/14/2009 0327   ACIDBASEDEF 0.8 09/15/2020 0500   O2SAT 98.7 09/15/2020 0500     Coagulation Profile: Recent Labs  Lab 09/10/20 1743  INR 1.4*    Cardiac Enzymes: Recent Labs  Lab 09/11/20 1720  CKMB 26.6*    HbA1C: Hgb A1c MFr Bld  Date/Time Value Ref Range Status  09/11/2020 04:37 AM 6.4 (H) 4.8 - 5.6 % Final    Comment:     (NOTE) Pre diabetes:          5.7%-6.4%  Diabetes:              >6.4%  Glycemic control for   <7.0% adults with diabetes   10/02/2019 12:28 PM 6.4 (H) 4.8 - 5.6 % Final    Comment:             Prediabetes: 5.7 - 6.4          Diabetes: >6.4          Glycemic control for adults with diabetes: <7.0     CBG: Recent Labs  Lab 09/14/20 1919 09/14/20 2328 09/15/20 0348 09/15/20 0742 09/15/20 [place]      Darel Hong, AGACNP-BC [facility] Prefer epic messenger for cross cover needs If after hours, please call E-link

## 2020-09-15 NOTE — Progress Notes (Signed)
Progress Note  Patient Name: Kathryn Garrett Date of Encounter: 09/15/2020  [place] Cardiologist: Larae Grooms, MD   Subjective   Still intubated and sedated.  Failed weaning trials this morning.  Blood pressure slightly elevated due to agitation.  Inpatient Medications    Scheduled Meds: . aspirin  81 mg Per Tube QHS  . budesonide (PULMICORT) nebulizer solution  0.25 mg Nebulization BID  . chlorhexidine gluconate (MEDLINE KIT)  15 mL Mouth Rinse BID  . Chlorhexidine Gluconate Cloth  6 each Topical QHS  . clonazePAM  0.5 mg Per Tube BID  . clopidogrel  75 mg Per Tube QHS  . docusate  100 mg Per Tube BID  . fentaNYL (SUBLIMAZE) injection  25 mcg Intravenous Once  . fluticasone  1 spray Each Nare Daily  . free water  30 mL Per Tube Q4H  . insulin aspart  0-15 Units Subcutaneous Q4H  . ipratropium-albuterol  3 mL Nebulization Q6H  . levothyroxine  88 mcg Per Tube Q0600  . mouth rinse  15 mL Mouth Rinse 10 times per day  . methylPREDNISolone (SOLU-MEDROL) injection  40 mg Intravenous Daily  . metoprolol tartrate  12.5 mg Per Tube BID  . multivitamin  15 mL Per Tube Daily  . mupirocin ointment  1 application Nasal BID  . polyethylene glycol  17 g Per Tube Daily  . rosuvastatin  20 mg Per Tube Daily  . vecuronium  10 mg Intravenous Once   Continuous Infusions: . sodium chloride 20 mL/hr at 09/14/20 0910  .  ceFAZolin (ANCEF) IV 2 g (09/15/20 0904)  . dexmedetomidine (PRECEDEX) IV infusion 0.8 mcg/kg/hr (09/15/20 0600)  . famotidine (PEPCID) IV 20 mg (09/15/20 9604)  . feeding supplement (VITAL HIGH PROTEIN) 1,000 mL (09/15/20 0857)  . fentaNYL infusion INTRAVENOUS 100 mcg/hr (09/15/20 0600)  . propofol (DIPRIVAN) infusion 25 mcg/kg/min (09/15/20 0600)   PRN Meds: acetaminophen **OR** acetaminophen, docusate sodium, fentaNYL, metoprolol tartrate, midazolam, polyethylene glycol   Vital Signs    Vitals:   09/15/20 0538 09/15/20 0600 09/15/20 0803 09/15/20  0815  BP:  (!) 104/57  (!) 152/82  Pulse:   80 (!) 149  Resp:  14 19   Temp:      TempSrc:      SpO2: 97%  100%   Weight:      Height:        Intake/Output Summary (Last 24 hours) at 09/15/2020 0949 Last data filed at 09/15/2020 0600 Gross per 24 hour  Intake 2782.66 ml  Output 2175 ml  Net 607.66 ml   Last 3 Weights 09/15/2020 09/14/2020 09/13/2020  Weight (lbs) 150 lb 9.2 oz 150 lb 2.1 oz 152 lb 12.5 oz  Weight (kg) 68.3 kg 68.1 kg 69.3 kg      Telemetry    Sinus tachycardia, heart rate 104- Personally Reviewed  ECG    No new tracing reviewed  Physical Exam   GEN:  Intubated, sedated Neck: No JVD Cardiac: RRR, no murmurs, rubs, or gallops.  Respiratory:  Vented breath sounds GI: Soft, nontender, non-distended  MS: No edema; No deformity. Neuro:   Unable to assess Psych: Unable to assess  Labs    High Sensitivity Troponin:   Recent Labs  Lab 09/10/20 2050 09/11/20 0437 09/11/20 1433 09/11/20 1611 09/11/20 2011  TROPONINIHS 1,838* 2,434* 2,265* 2,455* 2,317*      Chemistry Recent Labs  Lab 09/10/20 1417 09/11/20 5409 09/11/20 1720 09/12/20 0441 09/13/20 0439 09/14/20 0523 09/15/20 0552  NA 136  142  --    < > 141 142 143  K 3.8 3.3*  --    < > 3.7 4.0 4.5  CL 106 108  --    < > 112* 112* 114*  CO2 15* 19*  --    < > 19* 21* 25  GLUCOSE 350* 182*  --    < > 104* 136* 120*  BUN 22 26*  --    < > 49* 66* 62*  CREATININE 0.89 0.87  --    < > 0.76 0.72 0.62  CALCIUM 8.8* 8.2*  --    < > 8.7* 8.6* 8.3*  PROT 7.0 6.9  --   --   --   --   --   ALBUMIN 3.7 3.4*  --   --   --   --   --   AST 37 42* 23  --   --   --   --   ALT 42 58*  --   --   --   --   --   ALKPHOS 93 97  --   --   --   --   --   BILITOT 0.7 0.8  --   --   --   --   --   GFRNONAA >60 >60  --    < > >60 >60 >60  ANIONGAP 15 15  --    < > 10 9 4*   < > = values in this interval not displayed.     Hematology Recent Labs  Lab 09/13/20 0439 09/14/20 0523 09/15/20 0552  WBC 18.1* 10.9*  13.6*  RBC 4.12 3.77* 3.56*  HGB 12.5 11.3* 10.7*  HCT 39.9 36.5 35.0*  MCV 96.8 96.8 98.3  MCH 30.3 30.0 30.1  MCHC 31.3 31.0 30.6  RDW 15.6* 15.7* 16.2*  PLT 277 215 218    BNP Recent Labs  Lab 09/11/20 0437 09/11/20 1720 09/12/20 0441  BNP 1,009.8* 1,298.4* 970.2*     DDimer  Recent Labs  Lab 09/11/20 0437  DDIMER 3.47*     Radiology    MR BRAIN WO CONTRAST  Result Date: 09/13/2020 CLINICAL DATA:  Altered mental status.  Stroke presentation. EXAM: MRI HEAD WITHOUT CONTRAST TECHNIQUE: Multiplanar, multiecho pulse sequences of the brain and surrounding structures were obtained without intravenous contrast. COMPARISON:  Head CT 09/11/2020 FINDINGS: Brain: Diffusion imaging does not show any acute or subacute infarction. The brain shows generalized age related atrophy. No focal abnormality affects brainstem or cerebellum. Cerebral hemispheres show mild changes of chronic small vessel disease, fairly typical for age. No cortical or large vessel territory infarction. No mass lesion, hemorrhage, hydrocephalus or extra-axial collection. Vascular: Major vessels at the base of the brain show flow. Skull and upper cervical spine: Negative Sinuses/Orbits: No significant sinus inflammation. Bilateral mastoid effusions, often seen with mechanical ventilation. Orbits negative. Other: None IMPRESSION: No acute brain finding. Mild chronic small-vessel ischemic change of the white matter, fairly typical for age. Electronically Signed   By: Nelson Chimes M.D.   On: 09/13/2020 14:39   DG Chest Port 1 View  Result Date: 09/15/2020 CLINICAL DATA:  Acute respiratory failure with hypoxia EXAM: PORTABLE CHEST 1 VIEW COMPARISON:  Radiograph 09/14/2020, CT 09/11/2020 FINDINGS: *Endotracheal tube tip terminates in the mid trachea, approximately 3.2 cm from the expected location of the carina. *Transesophageal tube tip and side-port coiled in the left upper quadrant, distal to the GE junction. *A right IJ  catheter tip terminates near  the superior cavoatrial junction. *Stable cardiomediastinal contours with a calcified aorta. *Telemetry leads and external support devices overlie the chest. Redemonstration of the heterogeneous opacities throughout much of the left hemithorax and the right lung base as well, overall slightly improved from comparison exam, particular in the periphery of the left lung. No pneumothorax. Suspect trace left effusion. No visible right effusion. Stable cardiomediastinal contours with a calcified aorta. No acute osseous or soft tissue abnormality. Degenerative changes are present in the imaged spine and shoulders. IMPRESSION: Lines and tubes as above. Bilateral heterogeneous opacities in the lungs with fairly significant interval clearing of the left lung periphery. Trace residual left effusion. Aortic Atherosclerosis (ICD10-I70.0). Electronically Signed   By: Lovena Le M.D.   On: 09/15/2020 03:48   DG Chest Port 1 View  Result Date: 09/14/2020 CLINICAL DATA:  Acute respiratory failure with hypoxia EXAM: PORTABLE CHEST 1 VIEW COMPARISON:  Two days ago FINDINGS: Airspace disease asymmetric to the left and progressed. No visible effusion or pneumothorax. Endotracheal tube with tip between the clavicular heads and carina. Right IJ line with tip at the upper cavoatrial junction. Enteric tube with tip at the stomach. Normal heart size IMPRESSION: 1. Worsening airspace disease on the left. 2. Unremarkable hardware. Electronically Signed   By: Monte Fantasia M.D.   On: 09/14/2020 10:49    Cardiac Studies   TTEcho 09/11/2020 1. Left ventricular ejection fraction,  by estimation, is 25 to 30%. The left ventricle has severely decreased  function. Left ventricular  endocardial border not optimally defined to evaluate regional wall motion.  Left ventricular diastolic parameters are indeterminate.  2. Right ventricular systolic function is normal. The right ventricular  size is normal.   3. The mitral valve was not well visualized. Mild mitral valve  regurgitation.  4. The aortic valve was not well visualized. Aortic valve regurgitation  is not visualized.   Patient Profile     75 y.o. female with history of CAD/PCI to RCA 2017, PTCA to RCA for in-stent restenosis 2018 presenting with shortness of breath, cough. Admitted with respiratory failure, lower lobe pneumonia and COPD exacerbation.  Being seen due to CHF and NSTEMI   Assessment & Plan    1. NSTEMI, history of RCA PCI  -Troponins peaked at 2455  -s/p heparin gtt -Continue aspirin, Plavix.   -Left heart cath when respiratory function improves    2. Cardiomyopathy, EF 30%  -start lasix 17m iv daily -keep patient net negative.  -Additional CHF meds pending clinical course.   3. Pneumonia, COPD, respiratory failure  -Management as per ICU/medicine team   Total encounter time more than 35 minutes  Greater than 50% was spent in counseling and coordination of care with the patient     Signed, BKate Sable MD  09/15/2020, 9:49 AM

## 2020-09-16 ENCOUNTER — Inpatient Hospital Stay: Payer: Medicare HMO

## 2020-09-16 ENCOUNTER — Other Ambulatory Visit: Payer: Self-pay | Admitting: Interventional Cardiology

## 2020-09-16 DIAGNOSIS — I5020 Unspecified systolic (congestive) heart failure: Secondary | ICD-10-CM | POA: Diagnosis not present

## 2020-09-16 DIAGNOSIS — J441 Chronic obstructive pulmonary disease with (acute) exacerbation: Secondary | ICD-10-CM | POA: Diagnosis not present

## 2020-09-16 DIAGNOSIS — J189 Pneumonia, unspecified organism: Secondary | ICD-10-CM | POA: Diagnosis not present

## 2020-09-16 DIAGNOSIS — J9601 Acute respiratory failure with hypoxia: Secondary | ICD-10-CM | POA: Diagnosis not present

## 2020-09-16 LAB — CBC
HCT: 36.7 % (ref 36.0–46.0)
Hemoglobin: 11.3 g/dL — ABNORMAL LOW (ref 12.0–15.0)
MCH: 29.9 pg (ref 26.0–34.0)
MCHC: 30.8 g/dL (ref 30.0–36.0)
MCV: 97.1 fL (ref 80.0–100.0)
Platelets: 250 10*3/uL (ref 150–400)
RBC: 3.78 MIL/uL — ABNORMAL LOW (ref 3.87–5.11)
RDW: 15.9 % — ABNORMAL HIGH (ref 11.5–15.5)
WBC: 21.7 10*3/uL — ABNORMAL HIGH (ref 4.0–10.5)
nRBC: 0 % (ref 0.0–0.2)

## 2020-09-16 LAB — CBC WITH DIFFERENTIAL/PLATELET
Abs Immature Granulocytes: 0.31 10*3/uL — ABNORMAL HIGH (ref 0.00–0.07)
Basophils Absolute: 0 10*3/uL (ref 0.0–0.1)
Basophils Relative: 0 %
Eosinophils Absolute: 0 10*3/uL (ref 0.0–0.5)
Eosinophils Relative: 0 %
HCT: 37 % (ref 36.0–46.0)
Hemoglobin: 11.3 g/dL — ABNORMAL LOW (ref 12.0–15.0)
Immature Granulocytes: 1 %
Lymphocytes Relative: 23 %
Lymphs Abs: 5 10*3/uL — ABNORMAL HIGH (ref 0.7–4.0)
MCH: 29.9 pg (ref 26.0–34.0)
MCHC: 30.5 g/dL (ref 30.0–36.0)
MCV: 97.9 fL (ref 80.0–100.0)
Monocytes Absolute: 2 10*3/uL — ABNORMAL HIGH (ref 0.1–1.0)
Monocytes Relative: 9 %
Neutro Abs: 14.1 10*3/uL — ABNORMAL HIGH (ref 1.7–7.7)
Neutrophils Relative %: 67 %
Platelets: 256 10*3/uL (ref 150–400)
RBC: 3.78 MIL/uL — ABNORMAL LOW (ref 3.87–5.11)
RDW: 16 % — ABNORMAL HIGH (ref 11.5–15.5)
WBC: 21.4 10*3/uL — ABNORMAL HIGH (ref 4.0–10.5)
nRBC: 0 % (ref 0.0–0.2)

## 2020-09-16 LAB — BASIC METABOLIC PANEL
Anion gap: 9 (ref 5–15)
BUN: 60 mg/dL — ABNORMAL HIGH (ref 8–23)
CO2: 25 mmol/L (ref 22–32)
Calcium: 8.6 mg/dL — ABNORMAL LOW (ref 8.9–10.3)
Chloride: 108 mmol/L (ref 98–111)
Creatinine, Ser: 0.54 mg/dL (ref 0.44–1.00)
GFR, Estimated: >60 mL/min (ref >60)
Glucose, Bld: 141 mg/dL — ABNORMAL HIGH (ref 70–99)
Potassium: 4.2 mmol/L (ref 3.5–5.1)
Sodium: 142 mmol/L (ref 135–145)

## 2020-09-16 LAB — GLUCOSE, CAPILLARY
Glucose-Capillary: 139 mg/dL — ABNORMAL HIGH (ref 70–99)
Glucose-Capillary: 131 mg/dL — ABNORMAL HIGH (ref 70–99)
Glucose-Capillary: 197 mg/dL — ABNORMAL HIGH (ref 70–99)
Glucose-Capillary: 198 mg/dL — ABNORMAL HIGH (ref 70–99)
Glucose-Capillary: 189 mg/dL — ABNORMAL HIGH (ref 70–99)
Glucose-Capillary: 145 mg/dL — ABNORMAL HIGH (ref 70–99)

## 2020-09-16 LAB — PHOSPHORUS: Phosphorus: 3.8 mg/dL (ref 2.5–4.6)

## 2020-09-16 LAB — FUNGITELL, SERUM

## 2020-09-16 LAB — MAGNESIUM: Magnesium: 2.2 mg/dL (ref 1.7–2.4)

## 2020-09-16 IMAGING — DX DG CHEST 1V PORT
1 series · 1 of 1 positions shown · non-contrast
Comparison: [DATE].

CLINICAL DATA: Acute respiratory failure.

EXAM:
PORTABLE CHEST 1 VIEW

[chest ap]
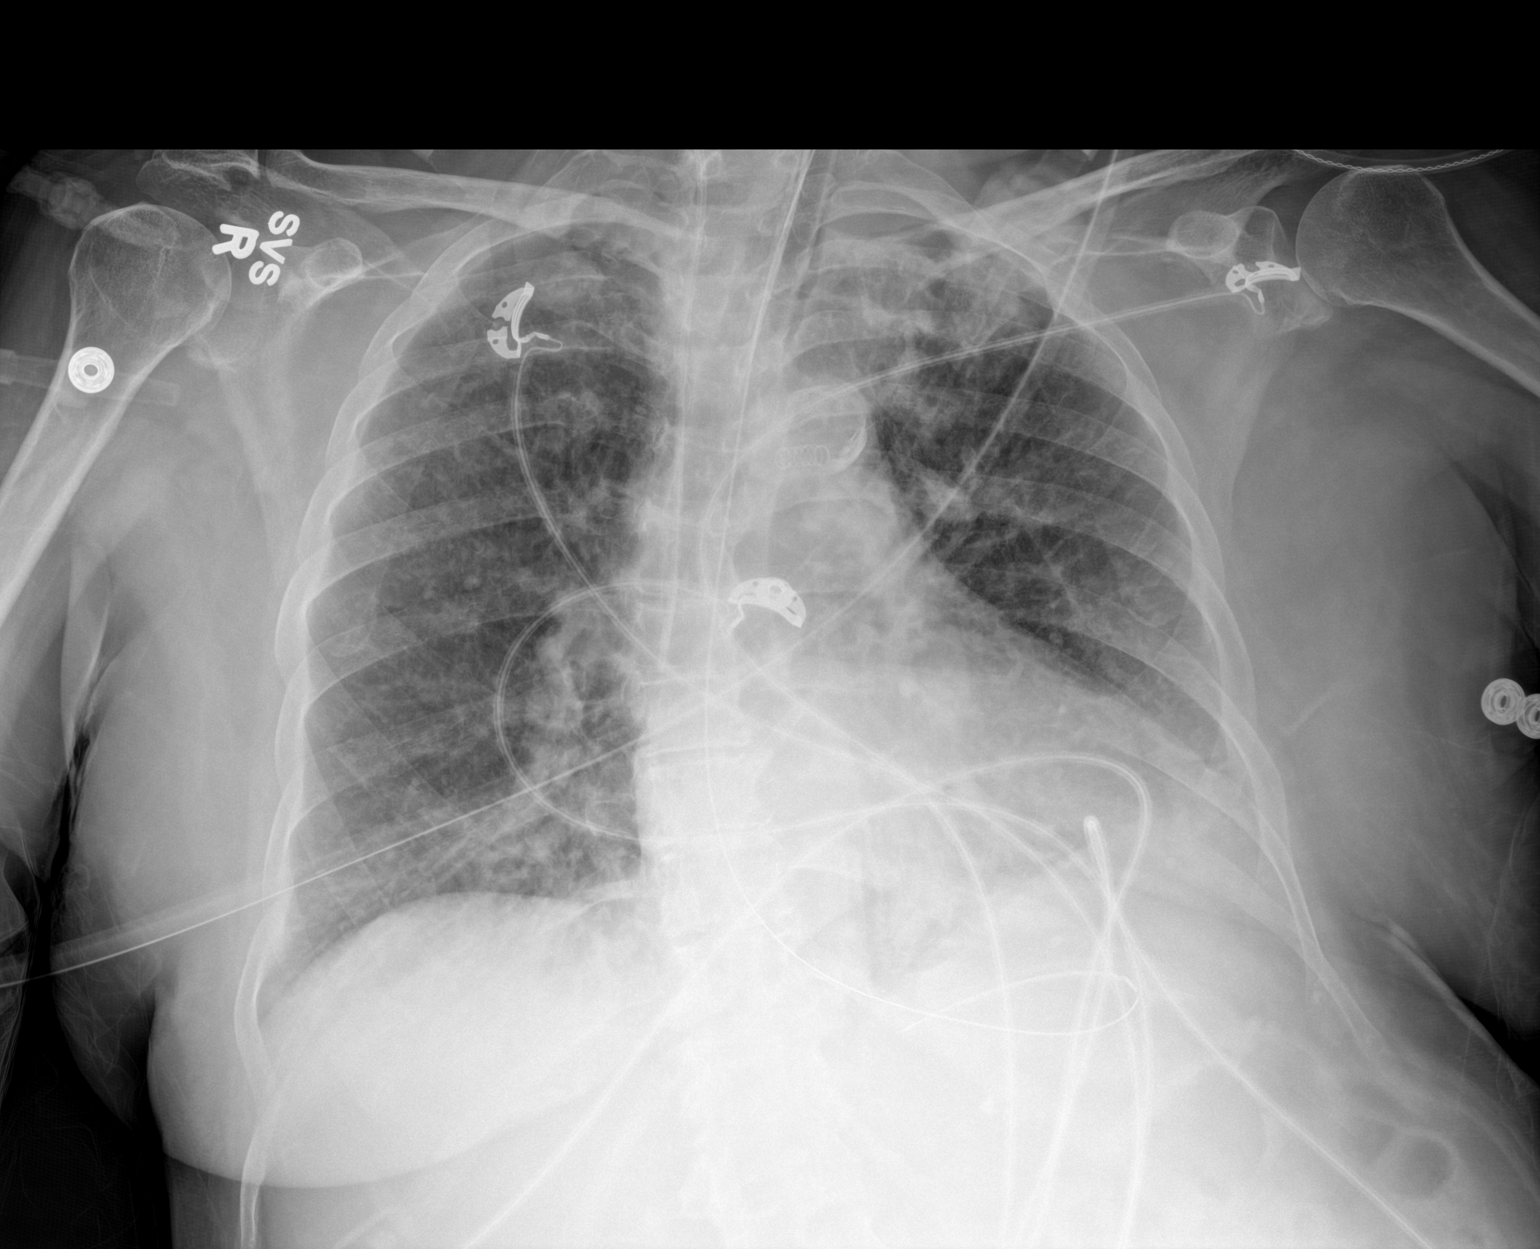

[1 of 1 positions shown; findings below may reference images not displayed]

FINDINGS: Endotracheal tube, NG tube, right IJ line in stable position. Heart
size normal. Persistent but improved bilateral interstitial
infiltrates. Tiny left pleural effusion cannot be excluded. No
pneumothorax.
IMPRESSION: 1.  Lines and tubes in stable position.

2.  Persistent but improved bilateral interstitial infiltrates.

## 2020-09-16 MED ORDER — ACETAMINOPHEN 650 MG RE SUPP: 650.0000 mg | Freq: Four times a day (QID) | RECTAL | Status: DC | PRN

## 2020-09-16 MED ORDER — POLYETHYLENE GLYCOL 3350 17 G PO PACK: 17.0000 g | PACK | Freq: Every day | ORAL | Status: DC | PRN

## 2020-09-16 MED ORDER — ACETAMINOPHEN 325 MG PO TABS
650.0000 mg | ORAL_TABLET | Freq: Four times a day (QID) | ORAL | Status: DC | PRN
Start: 2020-09-16 — End: 2020-09-17

## 2020-09-16 MED ORDER — METOPROLOL TARTRATE 25 MG PO TABS
25.0000 mg | ORAL_TABLET | Freq: Two times a day (BID) | ORAL | Status: DC
  Administered 2020-09-16 – 2020-09-17 (×2): 25 mg
  Filled 2020-09-16 (×2): qty 1

## 2020-09-16 MED ORDER — OXYCODONE HCL 5 MG PO TABS
7.5000 mg | ORAL_TABLET | Freq: Four times a day (QID) | ORAL | Status: DC
Start: 2020-09-16 — End: 2020-09-17
  Administered 2020-09-16 – 2020-09-17 (×3): 7.5 mg
  Filled 2020-09-16 (×3): qty 2

## 2020-09-16 MED ORDER — GUAIFENESIN 100 MG/5ML PO SOLN
10.0000 mL | ORAL | Status: DC
  Administered 2020-09-16 – 2020-09-17 (×5): 200 mg
  Filled 2020-09-16 (×7): qty 10

## 2020-09-16 MED ORDER — GUAIFENESIN ER 600 MG PO TB12
600.0000 mg | ORAL_TABLET | Freq: Two times a day (BID) | ORAL | Status: DC
  Administered 2020-09-16: 600 mg via ORAL
  Filled 2020-09-16: qty 1

## 2020-09-16 MED ORDER — PANTOPRAZOLE SODIUM 40 MG PO PACK
40.0000 mg | PACK | Freq: Every day | ORAL | Status: DC
  Administered 2020-09-16 – 2020-09-17 (×2): 40 mg
  Filled 2020-09-16 (×2): qty 20

## 2020-09-16 MED ORDER — LACTATED RINGERS IV BOLUS
500.0000 mL | Freq: Once | INTRAVENOUS | Status: AC
  Administered 2020-09-16: 500 mL via INTRAVENOUS

## 2020-09-16 MED ORDER — FUROSEMIDE 10 MG/ML IJ SOLN
40.0000 mg | Freq: Once | INTRAMUSCULAR | Status: AC
  Administered 2020-09-16: 40 mg via INTRAVENOUS
  Filled 2020-09-16: qty 4

## 2020-09-16 MED ORDER — ARTIFICIAL TEARS OPHTHALMIC OINT
TOPICAL_OINTMENT | OPHTHALMIC | Status: DC | PRN
  Filled 2020-09-16 (×2): qty 3.5

## 2020-09-16 MED ORDER — DOCUSATE SODIUM 50 MG/5ML PO LIQD: 100.0000 mg | Freq: Two times a day (BID) | ORAL | Status: DC | PRN

## 2020-09-16 NOTE — Progress Notes (Signed)
Progress Note  Patient Name: Kathryn Garrett Date of Encounter: 09/16/2020  Primary Cardiologist: [place]   She remains intubated and sedated. Milrinone has been tapered off as of 5/2. Net positive ~ 800 mL over the past 24 hours with a net + 2.9 L for the admission. T-max 100.4 over the past 24 hours. Has failed SBT over the past several days, planning for retrial today.   Inpatient Medications    Scheduled Meds: . aspirin  81 mg Per Tube QHS  . budesonide (PULMICORT) nebulizer solution  0.25 mg Nebulization BID  . chlorhexidine gluconate (MEDLINE KIT)  15 mL Mouth Rinse BID  . Chlorhexidine Gluconate Cloth  6 each Topical QHS  . clonazePAM  0.5 mg Per Tube BID  . clopidogrel  75 mg Per Tube QHS  . docusate  100 mg Per Tube BID  . fentaNYL (SUBLIMAZE) injection  25 mcg Intravenous Once  . fluticasone  1 spray Each Nare Daily  . free water  30 mL Per Tube Q4H  . furosemide  40 mg Intravenous Daily  . guaiFENesin  10 mL Per Tube Q4H  . insulin aspart  0-15 Units Subcutaneous Q4H  . ipratropium-albuterol  3 mL Nebulization Q6H  . levothyroxine  88 mcg Per Tube Q0600  . mouth rinse  15 mL Mouth Rinse 10 times per day  . methylPREDNISolone (SOLU-MEDROL) injection  40 mg Intravenous Daily  . metoprolol tartrate  25 mg Per Tube BID  . multivitamin  15 mL Per Tube Daily  . oxyCODONE  7.5 mg Per Tube Q6H  . pantoprazole sodium  40 mg Per Tube Daily  . polyethylene glycol  17 g Per Tube Daily  . rosuvastatin  20 mg Per Tube Daily  . vecuronium  10 mg Intravenous Once   Continuous Infusions: . sodium chloride 250 mL (09/16/20 0212)  . dexmedetomidine (PRECEDEX) IV infusion 1.8 mcg/kg/hr (09/16/20 0531)  . feeding supplement (VITAL HIGH PROTEIN) 1,000 mL (09/16/20 0648)  . fentaNYL infusion INTRAVENOUS 200 mcg/hr (09/16/20 0604)  . propofol (DIPRIVAN) infusion Stopped (09/15/20 2255)   PRN Meds: acetaminophen **OR** acetaminophen, docusate, fentaNYL, metoprolol  tartrate, midazolam, polyethylene glycol   Vital Signs    Vitals:   09/16/20 0625 09/16/20 0630 09/16/20 0700 09/16/20 0922  BP:  (!) 143/89 (!) 142/90 (!) 109/54  Pulse: (!) 138 (!) 117 (!) 119 (!) 127  Resp: (!) 21 (!) 26 (!) 25   Temp: 98.96 F (37.2 C) 99.32 F (37.4 C) 99.68 F (37.6 C)   TempSrc:      SpO2: 100% 98% 93%   Weight:      Height:        Intake/Output Summary (Last 24 hours) at 09/16/2020 1055 Last data filed at 09/16/2020 0344 Gross per 24 hour  Intake 1009.96 ml  Output 2100 ml  Net -1090.04 ml   Filed Weights   09/13/20 0500 09/14/20 0500 09/15/20 0500  Weight: 69.3 kg 68.1 kg 68.3 kg    Telemetry    SR with sinus tachycardia - Personally Reviewed  ECG    No new tracings - Personally Reviewed  Physical Exam   GEN: No acute distress. Ill appearing.   Neck: No JVD. Cardiac: RRR, no murmurs, rubs, or gallops.  Respiratory: Diminished, coarse and vented breath sounds bilaterally.  GI: Soft, nontender, non-distended.   MS: No edema; No deformity. Neuro:  Intubated and sedated.  Psych: Intubated and sedated.  Labs    Chemistry Recent Labs  Lab 09/10/20 1417 09/11/20 0437 09/11/20 1720 09/12/20 0441 09/14/20 0523 09/15/20 0552 09/16/20 0433  NA 136 142  --    < > 142 143 142  K 3.8 3.3*  --    < > 4.0 4.5 4.2  CL 106 108  --    < > 112* 114* 108  CO2 15* 19*  --    < > 21* 25 25  GLUCOSE 350* 182*  --    < > 136* 120* 141*  BUN 22 26*  --    < > 66* 62* 60*  CREATININE 0.89 0.87  --    < > 0.72 0.62 0.54  CALCIUM 8.8* 8.2*  --    < > 8.6* 8.3* 8.6*  PROT 7.0 6.9  --   --   --   --   --   ALBUMIN 3.7 3.4*  --   --   --   --   --   AST 37 42* 23  --   --   --   --   ALT 42 58*  --   --   --   --   --   ALKPHOS 93 97  --   --   --   --   --   BILITOT 0.7 0.8  --   --   --   --   --   GFRNONAA >60 >60  --    < > >60 >60 >60  ANIONGAP 15 15  --    < > 9 4* 9   < > = values in this interval not displayed.     Hematology Recent Labs   Lab 09/15/20 0552 09/16/20 0433 09/16/20 0500  WBC 13.6* 21.7* 21.4*  RBC 3.56* 3.78* 3.78*  HGB 10.7* 11.3* 11.3*  HCT 35.0* 36.7 37.0  MCV 98.3 97.1 97.9  MCH 30.1 29.9 29.9  MCHC 30.6 30.8 30.5  RDW 16.2* 15.9* 16.0*  PLT 218 250 256    Cardiac EnzymesNo results for input(s): TROPONINI in the last 168 hours. No results for input(s): TROPIPOC in the last 168 hours.   BNP Recent Labs  Lab 09/11/20 0437 09/11/20 1720 09/12/20 0441  BNP 1,009.8* 1,298.4* 970.2*     DDimer  Recent Labs  Lab 09/11/20 0437  DDIMER 3.47*     Radiology    DG Chest Port 1 View  Result Date: 09/16/2020 IMPRESSION: 1.  Lines and tubes in stable position. 2.  Persistent but improved bilateral interstitial infiltrates. Electronically Signed   By: Corn   On: 09/16/2020 07:44   DG Chest Port 1 View  Result Date: 09/15/2020 IMPRESSION: Lines and tubes as above. Bilateral heterogeneous opacities in the lungs with fairly significant interval clearing of the left lung periphery. Trace residual left effusion. Aortic Atherosclerosis (ICD10-I70.0). Electronically Signed   By: Lovena Le M.D.   On: 09/15/2020 03:48    Cardiac Studies   2D echo 09/11/2020: 1. Recommend limited echo with iv contrast agent (definity) for addequate  assessment of LVEF and wall motion.. Left ventricular ejection fraction,  by estimation, is 25 to 30%. The left ventricle has severely decreased  function. Left ventricular  endocardial border not optimally defined to evaluate regional wall motion.  Left ventricular diastolic parameters are indeterminate.  2. Right ventricular systolic function is normal. The right ventricular  size is normal.  3. The mitral valve was not well visualized. Mild mitral valve  regurgitation.  4. The aortic valve was not  well visualized. Aortic valve regurgitation  is not visualized.  __________  LHC 05/2016:  Ost RCA lesion, 30 %stenosed.  LM lesion, 30  %stenosed.  Ost LAD lesion, 50 %stenosed - looks similar to prior catheterization. FFR 0.87  The left ventricular systolic function is normal.  LV end diastolic pressure is normal.  The left ventricular ejection fraction is 55-65% by visual estimate.  There is mild (2+) mitral regurgitation.  _______CULPRIT LESION_____________  Prox RCA Overlapped BMS-DES stents, 99 % -n-stent re-stenosed.  PTCA with 3.0 mm [place] Balloon with Post-dilation using 3.5 mm [place] Balloon was perfromed. Post intervention, there is a 0% residual stenosis.   Recurrent in-stent restenosis of the overlapped stent (BMS and DES) in proximal RCA treated with aggressive angioplasty. Repeat FFR on ostial LAD confirms this is not likely significant.   Plan:  She'll be transferred to 6 Central post procedure unit for sheath removal and post PCI care.  Continue aggressive risk factor modification  Continue aspirin and Plavix lifelong  Smoking cessation counseling is mandatory to maintain stent patency __________  LHC 01/2016:  Ost LAD to Prox LAD lesion, 40 %stenosed. Similar to prior cath.  The left ventricular systolic function is normal.  LV end diastolic pressure is normal.  The left ventricular ejection fraction is 55-65% by visual estimate.  There is no aortic valve stenosis.  Prox RCA lesion, 75 %stenosed, in-stent restenosis.  Post intervention with 3.0 x 10 Angiosculpt inflated to 3.5 mm, there is a 0% residual stenosis.   Continue aggressive secondary prevention.  Clopidogrel restarted. _________  2D echo 09/2015: - Left ventricle: The cavity size was normal. Systolic function was  normal. The estimated ejection fraction was in the range of 60%  to 65%. Wall motion was normal; there were no regional wall  motion abnormalities. Doppler parameters are consistent with  abnormal left ventricular relaxation (grade 1 diastolic  dysfunction).  - Right ventricle: Systolic function  was normal.  - Pulmonary arteries: Systolic pressure was within the normal  range.   Impressions:   - Normal study.   Patient Profile     75 y.o. female with history of CAD with VF arrest s/p PCI to the RCA in 2017 s/p PTCA to the RCA for ISR in 2018, chronic combined systolic and diastolic CHF, COPD with ongoing tobacco use, HTN, HLD, anemia, and narcotic abuse on Suboxone therapy admitted with acute hypoxic respiratory failure requiring mechanical ventilation secondary to COPD exacerbation Klebsiella PNA and acute on chronic combined CHF with transient ST elevation with subsequent rise on HS-Tn to 2455 and echo demonstrating new LV dysfunction requiring augmentation with milrinone for diuresis.   Assessment & Plan    1. Acute on chronic combined systolic and diastolic CHF: -Previously required milrinone for augmentation of IV diuresis, now off milrinone as of late in the day on 5/2 -IV Lasix 40 mg daily with KCl repletion as indicated  -Lopressor for now, prior to discharge, would look to consolidate to Toprl XL or change to Coreg given her cardiomyopathy  -Escalate GDMT throughout her admission as able -Daily weights -Strict I/O  2. CAD involving the native coronary arteries with NSTEMI: -Unable to assess symptoms -Elevated troponin may represent supply demand mismatch ischemia in the context of known CAD with her acute multifactorial illness -She has completed IV heparin -ASA and Plavix -Crestor  -Once her respiratory status has improved she will need a cardiac cath  3. Acute respiratory failure with hypoxia: -Multifactorial including PNA, COPD exacerbation, and heart  failure -Gentle diuresis  -Further management per CCM  4. HLD: -LDL 43 in 09/2019 -PTA Crestor     For questions or updates, please contact [place] Please consult [email or web address] for contact info under Cardiology/STEMI.    Signed, Christell Faith, PA-C [place] Pager: [phone] 09/16/2020,  10:55 AM

## 2020-09-16 NOTE — Progress Notes (Signed)
Pt able to be weaned off propofol. Attempted to wean fentanyl pt HR went into the 120s, RR-30s and using accessory muscles to breathe, pt able to maintain O2> 90. PRN bolus was able to settle patient briefly. Boluses of Fentanyl wouldn't keep pt sedated. Pt was awake with eyes open, moving arms, but disoriented.  HR went back up into 130s-140s and RRin 40s, patient waking up. Fentanyl increased back to previous rate, along with PRN bolus. Pt was able to be sedated with prn bolus and increase of rate. Pt vitals stabilized and pt resting quietly.

## 2020-09-16 NOTE — Progress Notes (Signed)
Pharmacy Electrolyte Monitoring Consult:  Pharmacy consulted to assist in monitoring and replacing electrolytes in this 75 y.o. female admitted on 09/10/2020 with Respiratory Distress  Labs:  Sodium (mmol/L)  Date Value  09/16/2020 142  10/02/2019 141  03/23/2013 138   Potassium (mmol/L)  Date Value  09/16/2020 4.2  03/23/2013 4.2   Magnesium (mg/dL)  Date Value  09/16/2020 2.2  03/20/2013 2.0   Phosphorus (mg/dL)  Date Value  09/16/2020 3.8   Calcium (mg/dL)  Date Value  09/16/2020 8.6 (L)   Calcium, Total (mg/dL)  Date Value  03/23/2013 9.0   Albumin (g/dL)  Date Value  09/11/2020 3.4 (L)  10/02/2019 4.2  03/19/2013 3.3 (L)    Assessment: Patient is a 75 y/o F with medical history including CAD s/p stenting, CHF, COPD who is admitted with COPD exacerbation / pneumonia / decompensated heart failure / NSTEMI. Pharmacy has been consulted to assist with electrolyte monitoring and replacement as indicated.   Vital High Protein @50mL /hr Free water 49mL q4h  Goals of therapy: K 4-5.1 mmol/L Mg 2-2.4 mg/dL All other electrolytes within normal limits  Plan --Electrolytes WNL, no replacement needed at this time --Follow-up electrolytes with AM labs tomorrow  Sherilyn Banker, PharmD Pharmacy Resident  09/16/2020 6:45 AM

## 2020-09-16 NOTE — Progress Notes (Signed)
Patient is having multiple PACs, rhythm irregular. Dr. [name] Fulling notified and stat labs ordered. Will continue to monitor.

## 2020-09-16 NOTE — Progress Notes (Signed)
NAME:  Kathryn Garrett, MRN:  [id number], DOB:  17-Aug-1945, LOS: 6 ADMISSION DATE:  09/10/2020, CONSULTATION DATE: 09/11/2020 REFERRING MD: Dr. Damita Dunnings, CHIEF COMPLAINT: Shortness of breath  History of Present Illness:  75 year old female presented to the [facility] ED from home with complaints of shortness of breath.  Patient reported 2 to 3 days of progressive shortness of breath and associated new onset nonproductive cough as well as a subjective fever.  Per ED documentation she denied associated chest pain/palpitations/diaphoresis.  And denies any recent nausea/vomiting, chills/rigors/myalgias.  Patient confirmed a history of COPD and current smoking history stating she is not on any supplemental oxygen at baseline.  ED course: Patient received cefepime due to concerns for a left lower lobe infiltrate, 500 mL NS bolus & IV fluids at 150 mL an hour Initial vitals: Afebrile at 98, tachypneic at 22, tachycardic at 108, BP 127/72 & 99% on BiPAP Significant labs: Serum bicarb 15, hyperglycemic at 350, troponin 70 > 745 > 1838, BNP 222.6, VBG revealed metabolic acidosis: 9.89/21/194/17.4, leukocytosis at 40.2, lactic acidosis 2.5> 2.2 > 2.5  TRH hospitalist were consulted for admission.  Around midnight PCCM was called due to patient's deteriorating respiratory status.  Per ED provider Dr. Karma Greaser and care RN the patient complained that " I can't breathe", was given a dose of Ativan and became somnolent on BiPAP.  Coarse crackles auscultated bilaterally, and the decision was made to emergently intubate the patient and place her on mechanical ventilation. PCCM consulted for management  Significant Hospital Events: Including procedures, antibiotic start and stop dates in addition to other pertinent events   . 09/10/2020-patient admits admitted with hospitalist service for COPD exacerbation and left lower lobe pneumonia . 09/11/2020-overnight patient had suspected flash pulmonary edema, dyspnea followed by  somnolence requiring emergent intubation and mechanical ventilation.  Admitted to ICU . 09/12/20- patient failed SBT , she had blood tinged secretions fromETT and aggitation, family was at bedside and we agreed on giving her more time and try again.  . 09/14/20- Failed SBT (became hypoxic with sats mid 80's, increased WOB and assessory muscle use, HR 140-160's). Will diurese today and add Metoprolol.  Tracheal aspirate from 4/30 with KLEBSIELLA PNEUMONIAE (resistant to Ampicillin)   . 09/15/20- Failed SBT (increased WOB and assessory muscle use, RR 40's, HR 140-150's, Hypertensive); plan to add scheduled PO Klonopin and Oxycodone, Lasix 40 mg IV x1, consult Palliative Care . 09/16/20- Worsening Leukocytosis up to 21.7, low grade fever overnight, increased secretions overnight, will repeat Tracheal aspirate, remove central line, add mucinex, plan for SBT  Interim History / Subjective:  Overnight with low grade fever of 100.4, increased secretions Worsening Leukocytosis up to 21.7 Obtain repeat Tracheal aspirate, remove central line as not on vasopressors Diuresed 2.3 L yesterday (+ 1.3L since admit), creatinine remains stable Hemodynamically stable, afebrile this morning Plan for SBT today  Objective   Blood pressure (!) 142/90, pulse (!) 119, temperature 99.68 F (37.6 C), resp. rate (!) 25, height $RemoveBe'4\' 11"'[id number]$  (1.499 m), weight 68.3 kg, SpO2 93 %.    Vent Mode: PRVC FiO2 (%):  [30 %-60 %] 30 % Set Rate:  [15 bmp] 15 bmp Vt Set:  [500 mL] 500 mL PEEP:  [5 cmH20] 5 cmH20   Intake/Output Summary (Last 24 hours) at 09/16/2020 0804 Last data filed at 09/16/2020 0344 Gross per 24 hour  Intake 1272.94 ml  Output 2100 ml  Net -827.06 ml   Filed Weights   09/13/20 0500 09/14/20 0500 09/15/20 0500  Weight: 69.3 kg 68.1 kg 68.3 kg    Examination: General: Critically ill appearing female, laying in bed, intubated/sedated HEENT: Atraumatic, normocephalic, neck supple, no JVD, pupils PERRLA Neuro: Sedated,  does not follow commands, pupils PERRLA CV: Tachycardia, regular rhythm, S1-S2, no murmurs, rubs, gallops, 2+ distal pulses Pulm: Coarse breath sounds bilaterally, synchronous with the vent, even, no assessory muscle use GI: Soft, nondistended, nontender, no guarding or rebound tenderness, bowel sounds positive x4 Skin: Limited exam-warm and dry.  No obvious rashes, lesions, ulcerations Extremities: Warm and dry.  1+ edema bilateral lower extremities, no deformities   Labs/imaging that I have personally reviewed  (right click and "Reselect all SmartList Selections" daily)  Labs 09/15/2020: WBC 21.4, hemoglobin 11.3, glucose 141, BUN 60, creatinine 0.54 Chest x-ray 09/16/2020>>Endotracheal tube, NG tube, right IJ line in stable position. Heart size normal. Persistent but improved bilateral interstitial infiltrates. Tiny left pleural effusion cannot be excluded. No pneumothorax. MRI brain 09/13/2020:No acute brain finding. Mild chronic small-vessel ischemic change of the white matter, fairly typical for age.  Assessment & Plan:  Acute hypoxic respiratory failure secondary to Acute COPD Exacerbation & left lung multifocal pneumonia -Full vent support, continue lung protective ventilation -Wean FiO2 and PEEP as tolerated to maintain O2 sats 88 to 92% -Follow intermittent chest x-ray and ABG as needed -Spontaneous breathing trials when respiratory parameters met  -Implement VAP bundle -Bronchodilators -IV (Solu-Medrol 40 mg daily) and inhaled Steroids -Continue Cefazolin   Sepsis due to left lung multifocal pneumonia>> tracheal aspirate on 4/30 with KLEBSIELLA PNEUMONIAE (resistant to Ampicillin) -Monitor fever curve -Trend WBCs -Follow cultures as above -Continue Cefazolin (plan for 7 day total course of ABX, to complete on 5/5) -Worsening leukocytosis this morning and increased secretions ~ obtain repeat Tracheal Aspirate 5/5  Acute systolic congestive heart failure Acute non-ST elevation  MI New diagnosis of A. fib with RVR -Continuous cardiac monitoring -Patient's ejection fraction on echocardiogram is 30% -Diuresis as renal function and BP permits >> currently on 40 mg IV Lasix daily -Cardiology following, input is appreciated -Milrinone has been tapered off -Patient completed therapy with IV heparin -Continue aspirin Plavix and statin -She will need left heart cath once stable -Continue Metoprolol 12.5 mg PO BID  Acute metabolic/toxic encephalopathy Sedation needs in setting of Mechanical Ventilation -Maintain RASS goal of 0 to -1 -Fentanyl, Propofol, and Precedex as needed  -Continue Klonopin, will increase Oxycodone to 7.5 mg q6h -Daily wake up assessment -CT Head at admission negative -MRI Brain 09/13/20 negative -Continue Aspirin, Plavix, Atorvastatin -Provide supportive care    Best practice (right click and "Reselect all SmartList Selections" daily)  Diet:  NPO, tube feeds Pain/Anxiety/Delirium protocol (if indicated): Yes (RASS goal -1) VAP protocol (if indicated): Yes DVT prophylaxis: SCD (chemical prophylaxis held due to previous hemoptysis) GI prophylaxis: H2B Glucose control:  SSI Yes Central venous access: discontinue Right IJ CVC today 5/5 Arterial line:  N/A Foley: N/A Mobility:  bed rest  PT consulted: N/A Code Status:  full code Disposition: ICU  Myself and Dr. Loanne Drilling updated pt's 2 daughters at bedside 09/16/20.  Labs   CBC: Recent Labs  Lab 09/11/20 0437 09/12/20 0441 09/13/20 0439 09/14/20 0523 09/15/20 0552 09/16/20 0433  WBC 30.7* 27.8* 18.1* 10.9* 13.6* 21.7*  NEUTROABS 24.5*  --  13.8* 8.1*  --   --   HGB 14.2 12.6 12.5 11.3* 10.7* 11.3*  HCT 44.8 40.7 39.9 36.5 35.0* 36.7  MCV 97.0 96.9 96.8 96.8 98.3 97.1  PLT 324 353 277 215  218 373    Basic Metabolic Panel: Recent Labs  Lab 09/12/20 0441 09/13/20 0439 09/14/20 0523 09/15/20 0552 09/16/20 0433  NA 142 141 142 143 142  K 4.3 3.7 4.0 4.5 4.2  CL 115* 112*  112* 114* 108  CO2 19* 19* 21* 25 25  GLUCOSE 156* 104* 136* 120* 141*  BUN 31* 49* 66* 62* 60*  CREATININE 0.72 0.76 0.72 0.62 0.54  CALCIUM 8.4* 8.7* 8.6* 8.3* 8.6*  MG 2.1 2.3 2.4 2.4 2.2  PHOS 3.9 3.9 5.1* 4.1 3.8   GFR: Estimated Creatinine Clearance: 51 mL/min (by C-G formula based on SCr of 0.54 mg/dL). Recent Labs  Lab 09/10/20 1954 09/10/20 2050 09/11/20 1720 09/11/20 2011 09/12/20 0441 09/12/20 1159 09/12/20 1404 09/13/20 0439 09/14/20 0523 09/15/20 0552 09/16/20 0433  PROCALCITON 0.12  --   --   --   --  0.74  --  0.45  --   --   --   WBC  --    < >  --   --    < >  --   --  18.1* 10.9* 13.6* 21.7*  LATICACIDVEN  --    < > 2.5* 1.9  --  1.6 1.3  --   --   --   --    < > = values in this interval not displayed.    Liver Function Tests: Recent Labs  Lab 09/10/20 1417 09/11/20 0437 09/11/20 1720  AST 37 42* 23  ALT 42 58*  --   ALKPHOS 93 97  --   BILITOT 0.7 0.8  --   PROT 7.0 6.9  --   ALBUMIN 3.7 3.4*  --    No results for input(s): LIPASE, AMYLASE in the last 168 hours. No results for input(s): AMMONIA in the last 168 hours.  ABG    Component Value Date/Time   PHART 7.38 09/15/2020 0500   PCO2ART 41 09/15/2020 0500   PO2ART 123 (H) 09/15/2020 0500   HCO3 24.3 09/15/2020 0500   TCO2 23 01/14/2009 0327   ACIDBASEDEF 0.8 09/15/2020 0500   O2SAT 98.7 09/15/2020 0500     Coagulation Profile: Recent Labs  Lab 09/10/20 1743  INR 1.4*    Cardiac Enzymes: Recent Labs  Lab 09/11/20 1720  CKMB 26.6*    HbA1C: Hgb A1c MFr Bld  Date/Time Value Ref Range Status  09/11/2020 04:37 AM 6.4 (H) 4.8 - 5.6 % Final    Comment:    (NOTE) Pre diabetes:          5.7%-6.4%  Diabetes:              >6.4%  Glycemic control for   <7.0% adults with diabetes   10/02/2019 12:28 PM 6.4 (H) 4.8 - 5.6 % Final    Comment:             Prediabetes: 5.7 - 6.4          Diabetes: >6.4          Glycemic control for adults with diabetes: <7.0      CBG: Recent Labs  Lab 09/15/20 1536 09/15/20 1925 09/15/20 2317 09/16/20 0342 09/16/20 0739  GLUCAP 175* 143* 126* 139* 131*   Pt is critically ill requiring mechanical ventilation.  Prognosis is guarded.  Critical Care Time: 35 minutes  Darel Hong, AGACNP-BC [facility] Prefer epic messenger for cross cover needs If after hours, please call E-link

## 2020-09-16 NOTE — Progress Notes (Signed)
Patient BP 74/42, MAP 52. Sedation stopped. Dr. Erling Cruz notified of patient condition. Advised to trend next BP and notify if patient remains low or cannot withstand having sedation held. BP interval changed to q 59min. Will continue to monitor.

## 2020-09-16 NOTE — Progress Notes (Signed)
PCCM Update: Called by nursing staff about low MAPs. Patient continued to have MAPs in the mid 50s after sedation (fentanyl + precedex) were held for half hour or longer. Ordered for 563mL bolus of LR to be given.   Freda Jackson, MD [facility] Office: [phone]   See Amion for personal pager Please call [place] 7p-7a. [phone]

## 2020-09-16 NOTE — Plan of Care (Addendum)
Pt tolerated WUA/SBT much better this shift.  Sedation off 40 minutes.  HR did elevate to 130s and SBP in 170s, but RR remained in 20s w/ O2 sats in 90s and patient was not agonal on the vent, was able to move her feet on command and partially open her eyes.  Family at bedside for [place].  They agreed the patient had exercised enough after 5 minutes spontaneous breathing and PRVC/sedation was restarted.  VSS remained the same during this period, not ideal but much improved from yesterday. Currently attempting to wean fentanyl off

## 2020-09-17 ENCOUNTER — Inpatient Hospital Stay: Payer: Medicare HMO

## 2020-09-17 DIAGNOSIS — R0603 Acute respiratory distress: Secondary | ICD-10-CM

## 2020-09-17 DIAGNOSIS — I5023 Acute on chronic systolic (congestive) heart failure: Secondary | ICD-10-CM

## 2020-09-17 LAB — BLOOD GAS, VENOUS
Acid-Base Excess: 9.5 mmol/L — ABNORMAL HIGH (ref 0.0–2.0)
Bicarbonate: 33.5 mmol/L — ABNORMAL HIGH (ref 20.0–28.0)
FIO2: 0.3
O2 Saturation: 88.8 %
PEEP: 5 cmH2O
Patient temperature: 37
Pressure support: 5 cmH2O
pCO2, Ven: 42 mmHg — ABNORMAL LOW (ref 44.0–60.0)
pH, Ven: 7.51 — ABNORMAL HIGH (ref 7.250–7.430)
pO2, Ven: 50 mmHg — ABNORMAL HIGH (ref 32.0–45.0)

## 2020-09-17 LAB — CBC
HCT: 40.4 % (ref 36.0–46.0)
Hemoglobin: 12.3 g/dL (ref 12.0–15.0)
MCH: 29.9 pg (ref 26.0–34.0)
MCHC: 30.4 g/dL (ref 30.0–36.0)
MCV: 98.1 fL (ref 80.0–100.0)
Platelets: 280 10*3/uL (ref 150–400)
RBC: 4.12 MIL/uL (ref 3.87–5.11)
RDW: 15.5 % (ref 11.5–15.5)
WBC: 28.8 10*3/uL — ABNORMAL HIGH (ref 4.0–10.5)
nRBC: 0 % (ref 0.0–0.2)

## 2020-09-17 LAB — GLUCOSE, CAPILLARY
Glucose-Capillary: 113 mg/dL — ABNORMAL HIGH (ref 70–99)
Glucose-Capillary: 147 mg/dL — ABNORMAL HIGH (ref 70–99)
Glucose-Capillary: 139 mg/dL — ABNORMAL HIGH (ref 70–99)
Glucose-Capillary: 140 mg/dL — ABNORMAL HIGH (ref 70–99)
Glucose-Capillary: 132 mg/dL — ABNORMAL HIGH (ref 70–99)
Glucose-Capillary: 97 mg/dL (ref 70–99)

## 2020-09-17 LAB — BASIC METABOLIC PANEL
Anion gap: 11 (ref 5–15)
Anion gap: 11 (ref 5–15)
BUN: 64 mg/dL — ABNORMAL HIGH (ref 8–23)
BUN: 65 mg/dL — ABNORMAL HIGH (ref 8–23)
CO2: 28 mmol/L (ref 22–32)
CO2: 28 mmol/L (ref 22–32)
Calcium: 8.5 mg/dL — ABNORMAL LOW (ref 8.9–10.3)
Calcium: 8.9 mg/dL (ref 8.9–10.3)
Chloride: 100 mmol/L (ref 98–111)
Chloride: 99 mmol/L (ref 98–111)
Creatinine, Ser: 0.66 mg/dL (ref 0.44–1.00)
Creatinine, Ser: 0.7 mg/dL (ref 0.44–1.00)
GFR, Estimated: >60 mL/min (ref >60)
GFR, Estimated: >60 mL/min (ref >60)
Glucose, Bld: 107 mg/dL — ABNORMAL HIGH (ref 70–99)
Glucose, Bld: 122 mg/dL — ABNORMAL HIGH (ref 70–99)
Potassium: 4.3 mmol/L (ref 3.5–5.1)
Potassium: 4.4 mmol/L (ref 3.5–5.1)
Sodium: 138 mmol/L (ref 135–145)
Sodium: 139 mmol/L (ref 135–145)

## 2020-09-17 LAB — MAGNESIUM
Magnesium: 2.1 mg/dL (ref 1.7–2.4)
Magnesium: 2.2 mg/dL (ref 1.7–2.4)

## 2020-09-17 LAB — PHOSPHORUS: Phosphorus: 4.5 mg/dL (ref 2.5–4.6)

## 2020-09-17 IMAGING — DX DG CHEST 1V PORT
1 series · 1 of 1 positions shown · non-contrast
Comparison: Portable exam [8H] hours compared to [DATE]

CLINICAL DATA: Hypoxia, increased respiratory distress, history
coronary artery disease post MI, COPD, hypertension, former smoker

EXAM:
PORTABLE CHEST 1 VIEW

[chest ap]
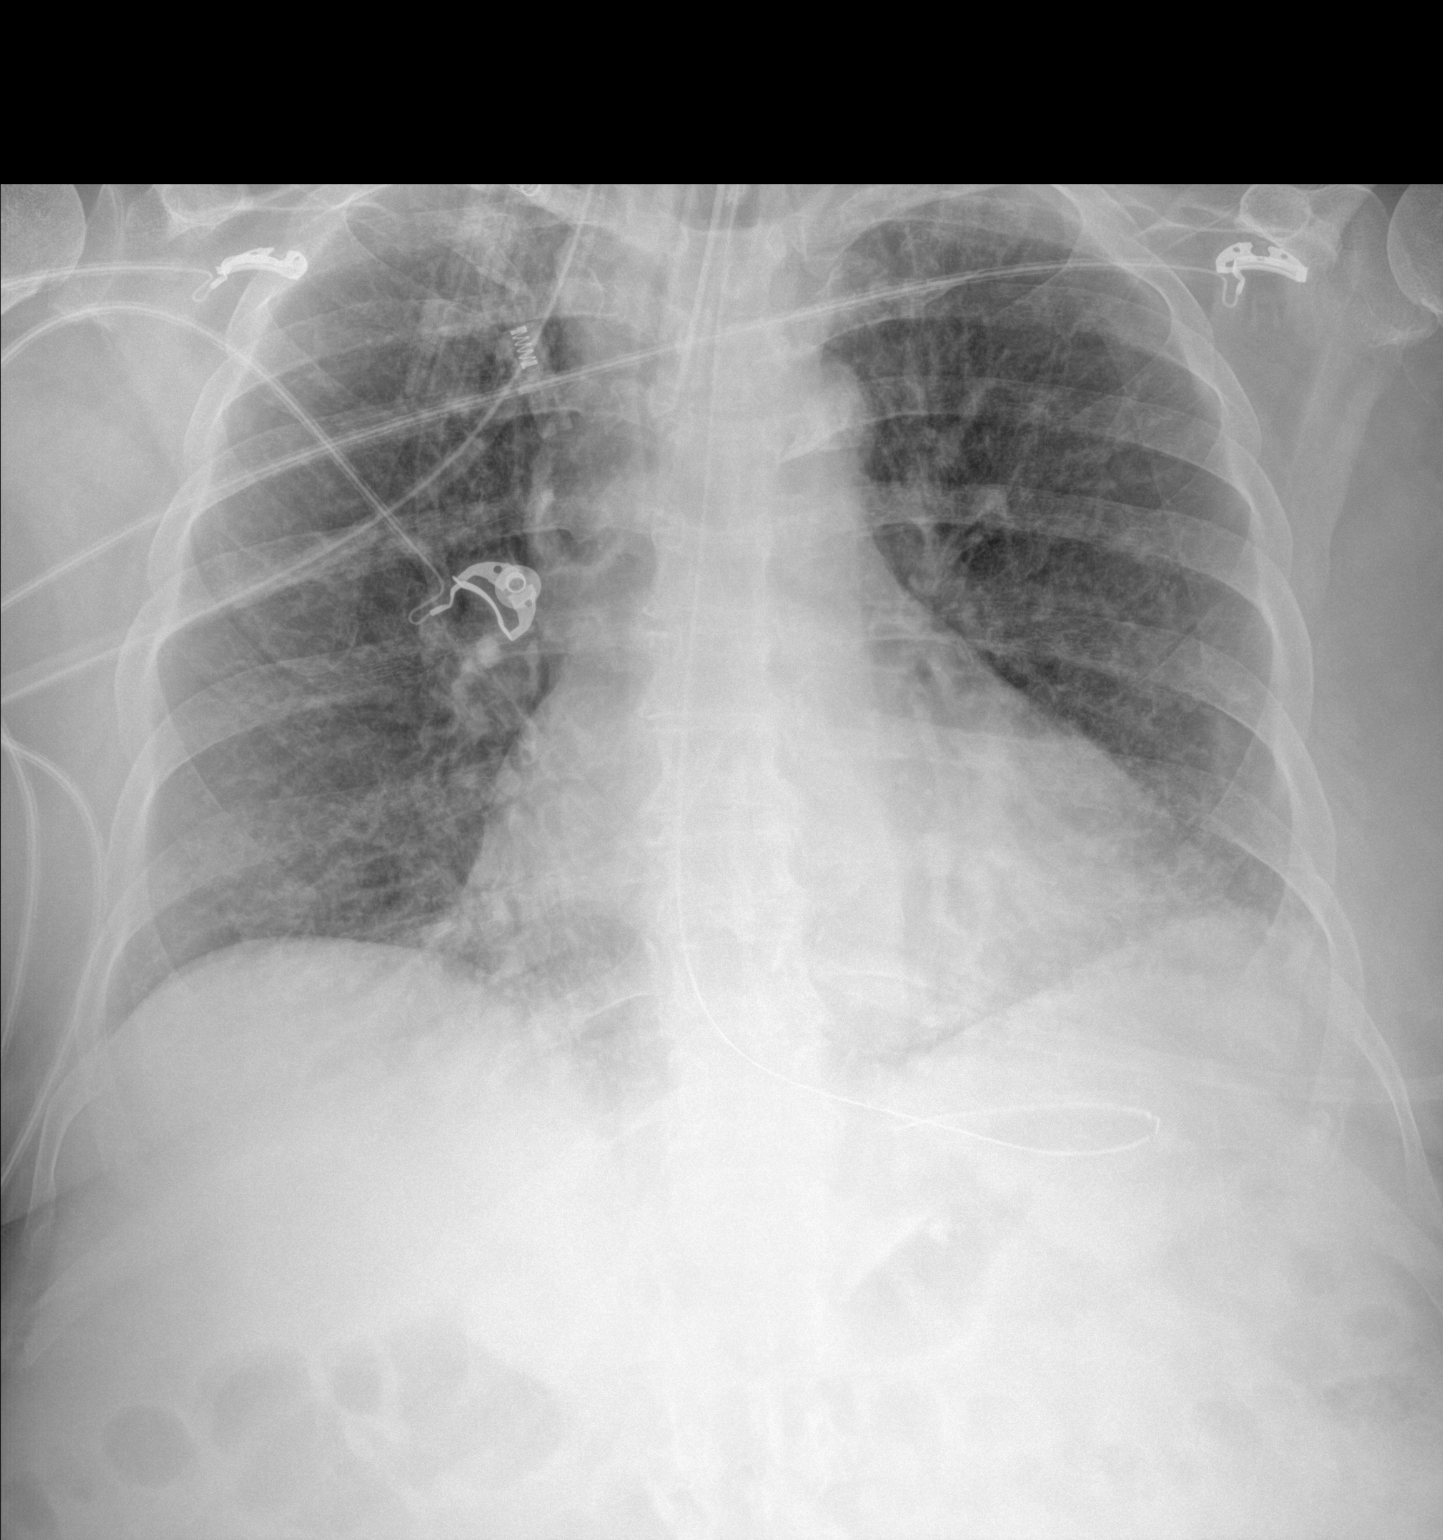

[1 of 1 positions shown; findings below may reference images not displayed]

FINDINGS: Tip of endotracheal tube projects 3.0 cm above carina.

Nasogastric tube coiled in proximal stomach.

Upper normal heart size with slight pulmonary vascular congestion.

Mediastinal contours normal.

Atherosclerotic calcification aorta.

Lungs clear.

No definite infiltrate, pleural effusion, or pneumothorax.
IMPRESSION: No acute abnormalities.

Aortic Atherosclerosis ([8H]-[8H]).

## 2020-09-17 IMAGING — CT CT HEAD W/O CM
3 series · 15 of 47 positions shown, 18 images · non-contrast
Comparison: [DATE] CT, MRI [DATE]

CLINICAL DATA: Acute neuro deficit, stroke suspected, acute COPD
exacerbation

EXAM:
CT HEAD WITHOUT CONTRAST
TECHNIQUE: Contiguous axial images were obtained from the base of the skull
through the vertex without intravenous contrast.

[Series 3: head wo · axial · 0.45mm/px · z∈[+347,+482]mm · 9 of 33 slices shown, 12 images]
[im 3/33  brain]
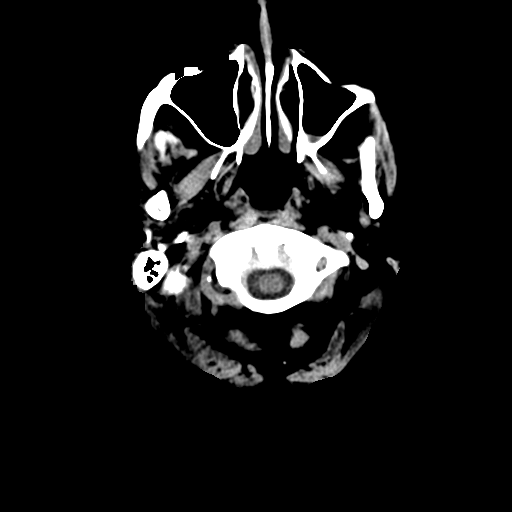
[im 3/33  bone]
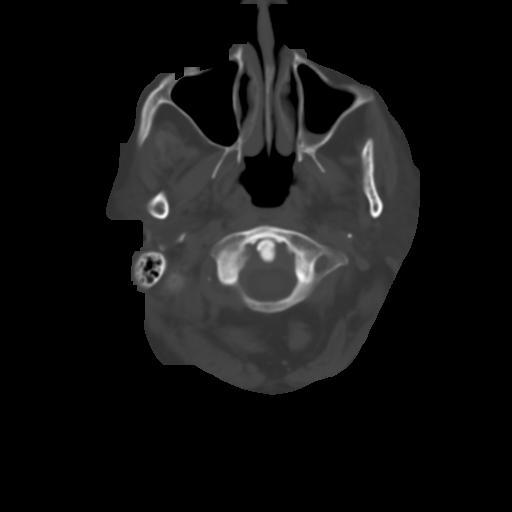
[im 6/33  brain]
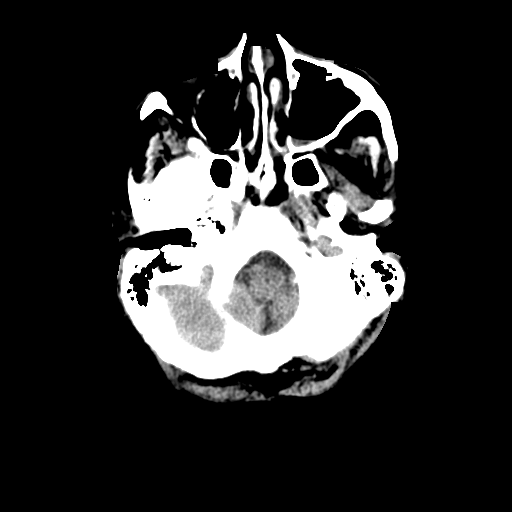
[im 9/33  brain]
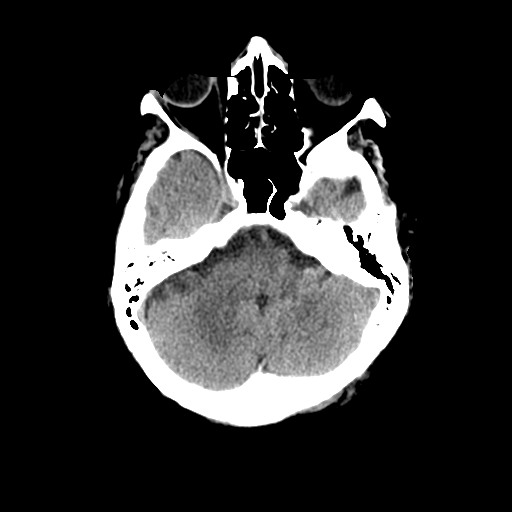
[im 13/33  brain]
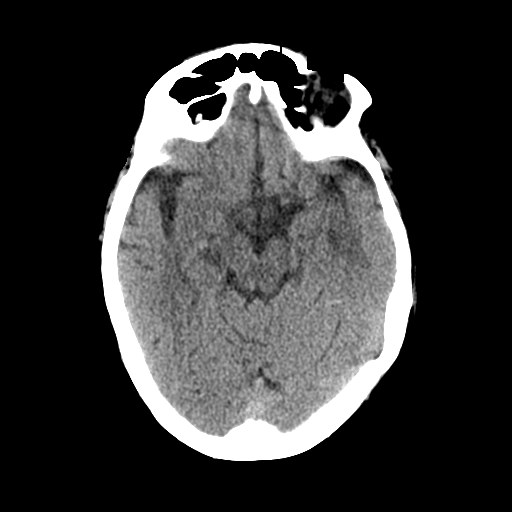
[im 17/33  brain]
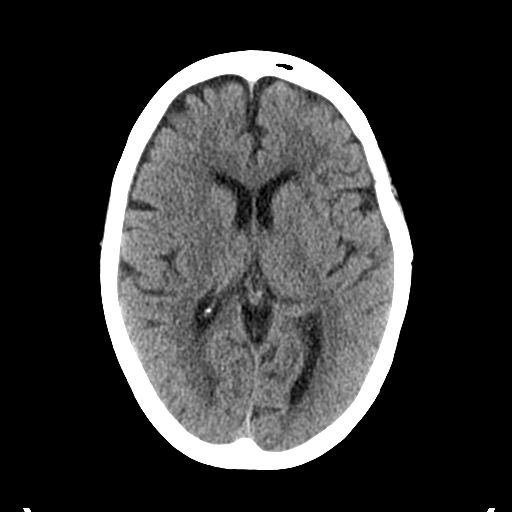
[im 17/33  bone]
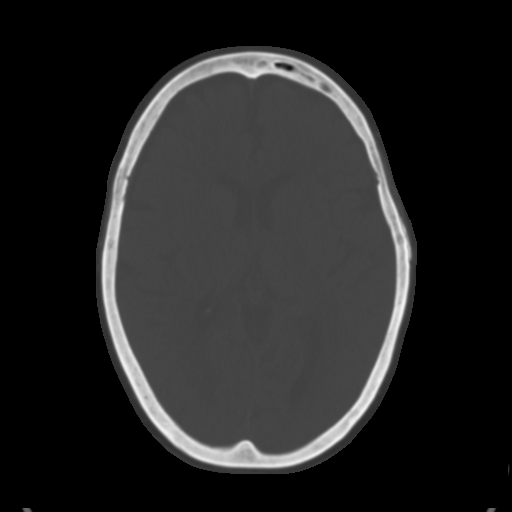
[im 20/33  brain]
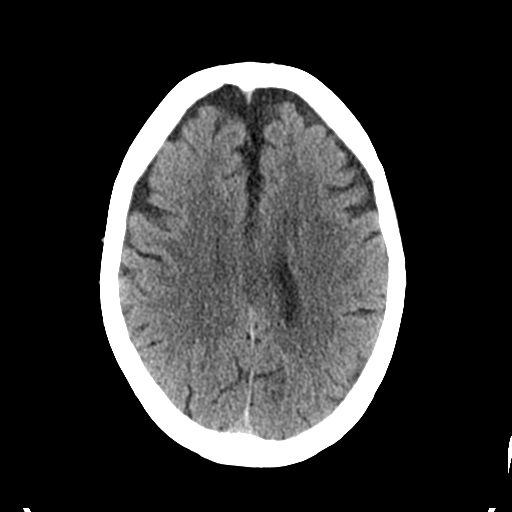
[im 24/33  brain]
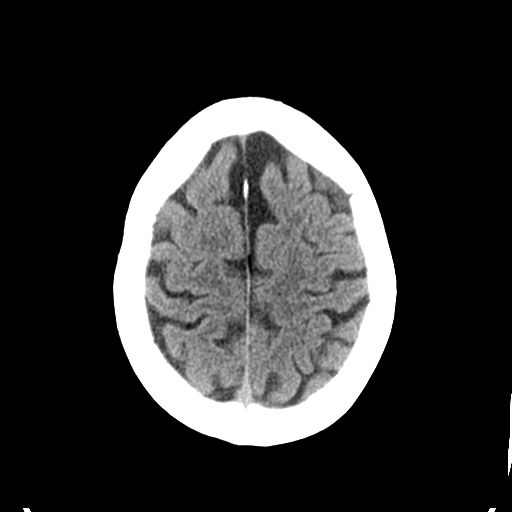
[im 27/33  brain]
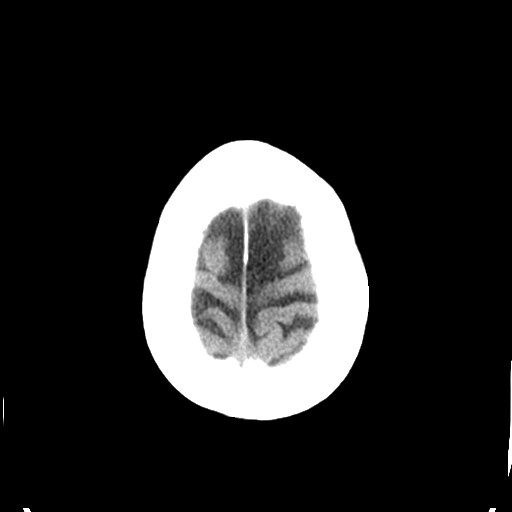
[im 30/33  brain]
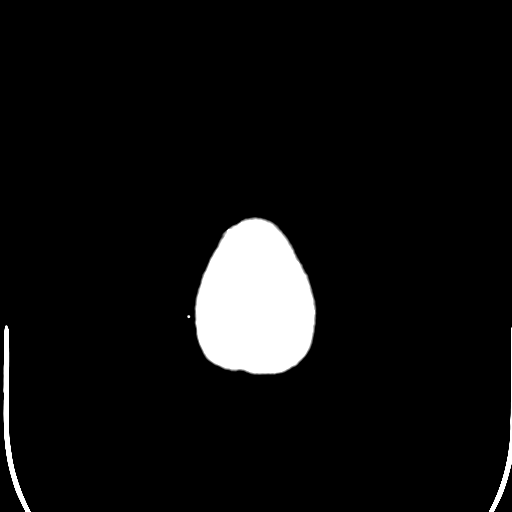
[im 30/33  bone]
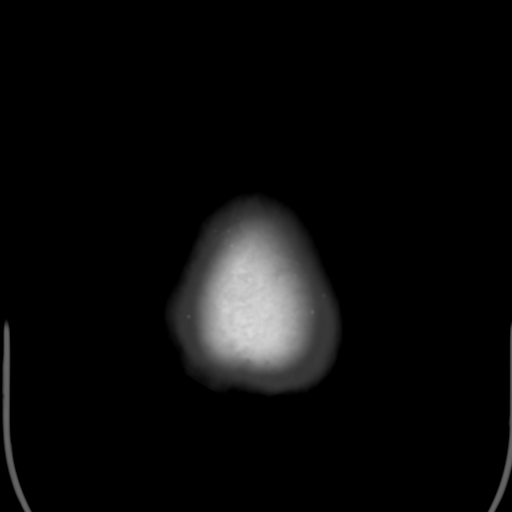

[Series 4: coronal soft tissue · coronal · 0.29mm/px · 3 of 70 slices shown]
[im 24/70  brain]
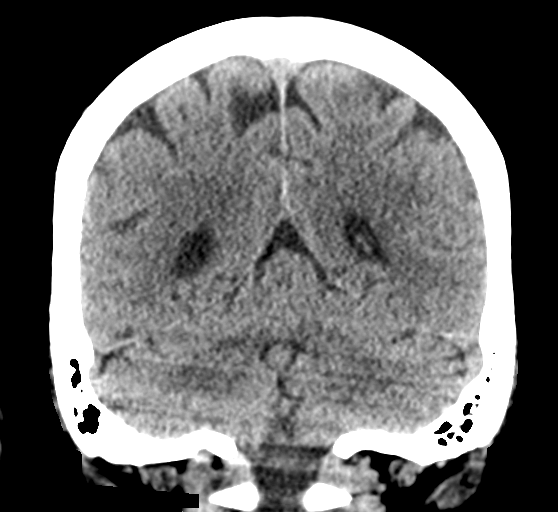
[im 31/70  brain]
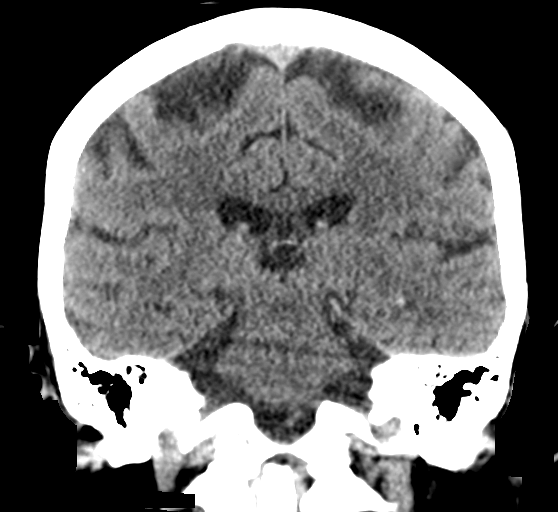
[im 39/70  brain]
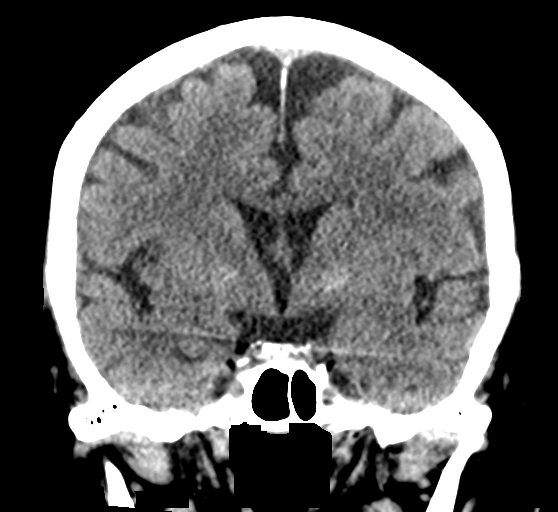

[Series 5: sagittal soft tissue · sagittal · 0.31mm/px · 3 of 54 slices shown]
[im 18/54  brain]
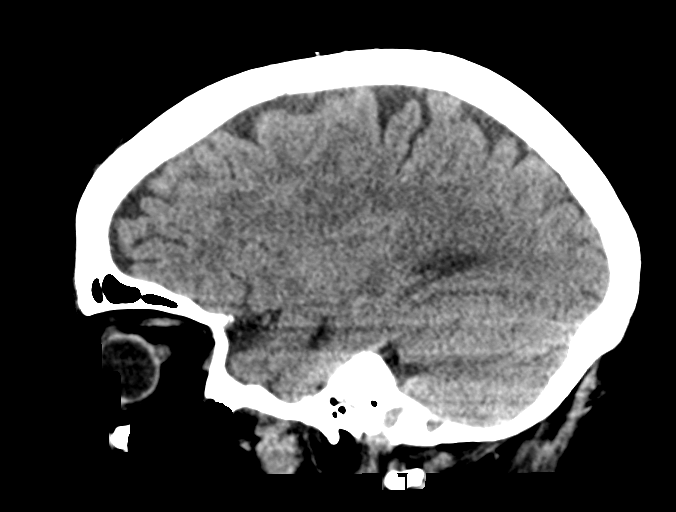
[im 27/54  brain]
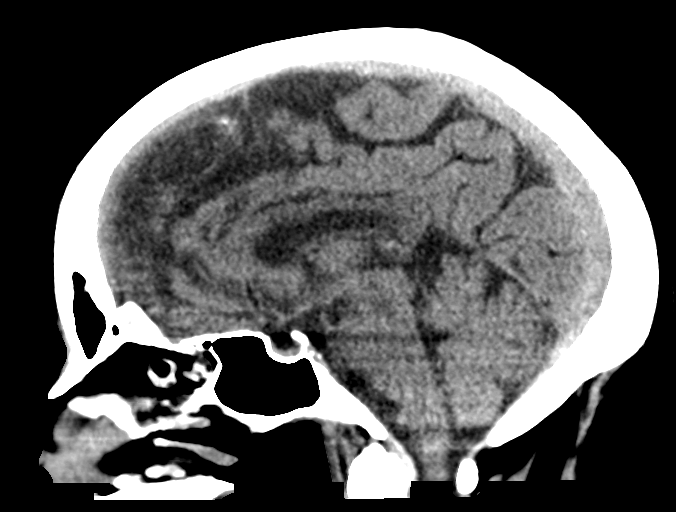
[im 36/54  brain]
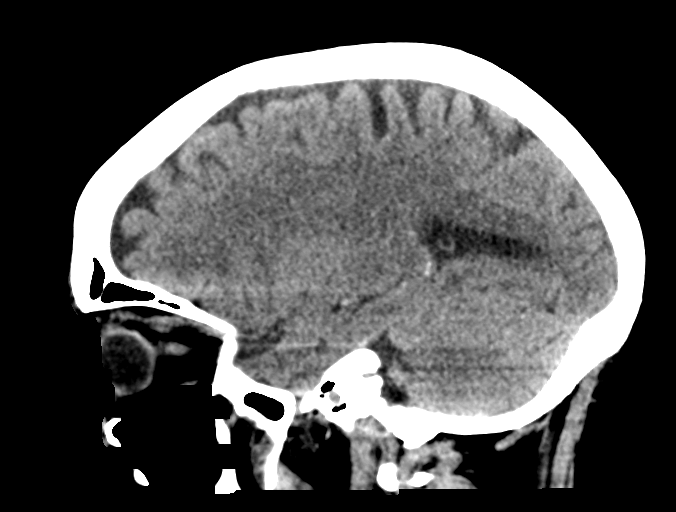

[15 of 47 positions shown; findings below may reference images not displayed]

FINDINGS: Brain: Stable benign mineralization in the bilateral basal ganglia.
No evidence of acute infarction, hemorrhage, hydrocephalus,
extra-axial collection, visible mass lesion or mass effect.
Symmetric prominence of the ventricles, cisterns and sulci
compatible with parenchymal volume loss. Patchy areas of white
matter hypoattenuation are most compatible with chronic
microvascular angiopathy.

Vascular: Atherosclerotic calcification of the carotid siphons. No
hyperdense vessel.

Skull: No calvarial fracture or suspicious osseous lesion. No scalp
swelling or hematoma.

Sinuses/Orbits: Layering air-fluid level noted in the left maxillary
sinus with some mild thickening in the ethmoids. Mastoid air cells
and middle ear cavities are clear. Prior bilateral lens extractions.
Stable mineralization along the left lateral rectus insertion.
Orbits otherwise unremarkable.

Other: None.
IMPRESSION: No acute intracranial abnormality. If there is persisting concern
for acute infarction, MRI is more sensitive and specific for early
features of ischemia.

Air-fluid level left maxillary sinus, possibly related to
instrumentation versus sinusitis.

## 2020-09-17 MED ORDER — POLYETHYLENE GLYCOL 3350 17 G PO PACK
17.0000 g | PACK | Freq: Every day | ORAL | Status: DC | PRN
Start: 2020-09-17 — End: 2020-10-13

## 2020-09-17 MED ORDER — METOPROLOL TARTRATE 5 MG/5ML IV SOLN
5.0000 mg | Freq: Once | INTRAVENOUS | Status: AC
  Administered 2020-09-17: 5 mg via INTRAVENOUS

## 2020-09-17 MED ORDER — DOCUSATE SODIUM 50 MG/5ML PO LIQD: 100.0000 mg | Freq: Two times a day (BID) | ORAL | Status: DC | PRN

## 2020-09-17 MED ORDER — PREDNISONE 10 MG PO TABS: 20.0000 mg | ORAL_TABLET | Freq: Every day | ORAL | Status: DC

## 2020-09-17 MED ORDER — ASPIRIN 81 MG PO CHEW: 81.0000 mg | CHEWABLE_TABLET | Freq: Every day | ORAL | Status: DC

## 2020-09-17 MED ORDER — PANTOPRAZOLE SODIUM 40 MG PO PACK
40.0000 mg | PACK | Freq: Every day | ORAL | Status: DC
  Administered 2020-09-19: 40 mg via ORAL
  Filled 2020-09-17: qty 20

## 2020-09-17 MED ORDER — METOPROLOL TARTRATE 25 MG PO TABS: 25.0000 mg | ORAL_TABLET | Freq: Two times a day (BID) | ORAL | Status: DC

## 2020-09-17 MED ORDER — LEVOTHYROXINE SODIUM 88 MCG PO TABS
88.0000 ug | ORAL_TABLET | Freq: Every day | ORAL | Status: DC
  Filled 2020-09-17 (×3): qty 1

## 2020-09-17 MED ORDER — GUAIFENESIN 100 MG/5ML PO SOLN
10.0000 mL | ORAL | Status: DC
  Filled 2020-09-17 (×17): qty 10

## 2020-09-17 MED ORDER — CLONAZEPAM 0.5 MG PO TABS: 0.5000 mg | ORAL_TABLET | Freq: Two times a day (BID) | ORAL | Status: DC

## 2020-09-17 MED ORDER — METOPROLOL TARTRATE 5 MG/5ML IV SOLN
5.0000 mg | Freq: Four times a day (QID) | INTRAVENOUS | Status: DC
  Administered 2020-09-17 – 2020-09-19 (×6): 5 mg via INTRAVENOUS
  Filled 2020-09-17 (×6): qty 5

## 2020-09-17 MED ORDER — ALBUTEROL SULFATE (2.5 MG/3ML) 0.083% IN NEBU
2.5000 mg | INHALATION_SOLUTION | RESPIRATORY_TRACT | Status: DC | PRN
  Administered 2020-10-01 – 2020-10-02 (×2): 2.5 mg via RESPIRATORY_TRACT
  Filled 2020-09-17 (×2): qty 3

## 2020-09-17 MED ORDER — ORAL CARE MOUTH RINSE
15.0000 mL | Freq: Two times a day (BID) | OROMUCOSAL | Status: DC
  Administered 2020-09-17 – 2020-10-13 (×41): 15 mL via OROMUCOSAL

## 2020-09-17 MED ORDER — DOCUSATE SODIUM 50 MG/5ML PO LIQD: 100.0000 mg | Freq: Two times a day (BID) | ORAL | Status: DC

## 2020-09-17 MED ORDER — ROSUVASTATIN CALCIUM 10 MG PO TABS: 20.0000 mg | ORAL_TABLET | Freq: Every day | ORAL | Status: DC

## 2020-09-17 MED ORDER — OXYCODONE HCL 5 MG PO TABS
7.5000 mg | ORAL_TABLET | Freq: Four times a day (QID) | ORAL | Status: DC
  Administered 2020-09-19: 5 mg via ORAL
  Administered 2020-09-19: 7.5 mg via ORAL
  Filled 2020-09-17 (×2): qty 2

## 2020-09-17 MED ORDER — METOPROLOL TARTRATE 25 MG PO TABS: 12.5000 mg | ORAL_TABLET | Freq: Two times a day (BID) | ORAL | Status: DC

## 2020-09-17 MED ORDER — ADULT MULTIVITAMIN W/MINERALS CH: 1.0000 | ORAL_TABLET | Freq: Every day | ORAL | Status: DC

## 2020-09-17 MED ORDER — CLOPIDOGREL BISULFATE 75 MG PO TABS: 75.0000 mg | ORAL_TABLET | Freq: Every day | ORAL | Status: DC

## 2020-09-17 MED ORDER — MORPHINE SULFATE (PF) 2 MG/ML IV SOLN
2.0000 mg | INTRAVENOUS | Status: DC | PRN
  Administered 2020-09-17 – 2020-09-18 (×2): 2 mg via INTRAVENOUS
  Filled 2020-09-17 (×2): qty 1

## 2020-09-17 MED ORDER — POLYETHYLENE GLYCOL 3350 17 G PO PACK: 17.0000 g | PACK | Freq: Every day | ORAL | Status: DC

## 2020-09-17 MED ORDER — ACETAMINOPHEN 325 MG PO TABS: 650.0000 mg | ORAL_TABLET | Freq: Four times a day (QID) | ORAL | Status: DC | PRN

## 2020-09-17 MED ORDER — ACETAMINOPHEN 650 MG RE SUPP: 650.0000 mg | Freq: Four times a day (QID) | RECTAL | Status: DC | PRN

## 2020-09-17 NOTE — Progress Notes (Signed)
Patient BP back down in the 62B systolic. Sedation weaned and then stopped. New #[id number] started in patient left hand as IV in RFA is not useable. ELink contacted for further support. Awaiting further orders.

## 2020-09-17 NOTE — Progress Notes (Signed)
Pt extubated per MD order. Pt placed on 3Lnc for pt comfort. Pt tolerated procedure well. Will titrated FiO2 as tolerated.

## 2020-09-17 NOTE — Progress Notes (Signed)
Pharmacy Electrolyte Monitoring Consult:  Labs:  Sodium (mmol/L)  Date Value  09/17/2020 139  10/02/2019 141  03/23/2013 138   Potassium (mmol/L)  Date Value  09/17/2020 4.3  03/23/2013 4.2   Magnesium (mg/dL)  Date Value  09/17/2020 2.2  03/20/2013 2.0   Phosphorus (mg/dL)  Date Value  09/17/2020 4.5   Calcium (mg/dL)  Date Value  09/17/2020 8.9   Calcium, Total (mg/dL)  Date Value  03/23/2013 9.0   Albumin (g/dL)  Date Value  09/11/2020 3.4 (L)  10/02/2019 4.2  03/19/2013 3.3 (L)    Assessment: Patient is a 75 y/o F with medical history including CAD s/p stenting, CHF, COPD who is admitted with COPD exacerbation / pneumonia / decompensated heart failure / NSTEMI. Pharmacy has been consulted to assist with electrolyte monitoring and replacement as indicated.   Vital High Protein @50mL /hr Free water 40mL q4h  Goals of therapy: K 4-5.1 mmol/L Mg 2-2.4 mg/dL All other electrolytes within normal limits  Plan --Electrolytes WNL, no replacement needed at this time --Follow-up electrolytes with AM labs tomorrow  Sherilyn Banker, PharmD Pharmacy Resident  09/17/2020 6:16 AM

## 2020-09-17 NOTE — Progress Notes (Signed)
After a minimal adjustment to Precedex, patient became RASS -1 to RASS -3. Neuro assessment performed and revealed a slight difference in pupil size, Left 36mm and eight 28mm. Eline consulted about neuro change. Patient still arousable, moves legs and left arm. No longer has minimal squeeze of rt hand but this was not consistent this shift. Will continue to monitor.

## 2020-09-17 NOTE — Progress Notes (Signed)
eLink Physician-Brief Progress Note Patient Name: DEBRIA BROECKER DOB: Aug 10, 1945 MRN: [id number]   Date of Service  09/17/2020  HPI/Events of Note  Nursing reports that the left pupil now 3 reactive, right 2 reactive.   Also reports pt was moving right arm nonconsistently earlier in shift but not moving it now.  Appears to follow commands w/left arm/hand  eICU Interventions  Plan: 1. Head CT Scan w/o contrast STAT.      Intervention Category Major Interventions: Change in mental status - evaluation and management  Hades Mathew Eugene 09/17/2020, 2:43 AM

## 2020-09-17 NOTE — Progress Notes (Signed)
NAME:  Kathryn Garrett, MRN:  [id number], DOB:  04-Sep-1945, LOS: 7 ADMISSION DATE:  09/10/2020, CONSULTATION DATE: 09/11/2020 REFERRING MD: Dr. Damita Dunnings, CHIEF COMPLAINT: Shortness of breath  History of Present Illness:  75 year old female presented to the [facility] ED from home with complaints of shortness of breath.  Patient reported 2 to 3 days of progressive shortness of breath and associated new onset nonproductive cough as well as a subjective fever.  Per ED documentation she denied associated chest pain/palpitations/diaphoresis.  And denies any recent nausea/vomiting, chills/rigors/myalgias.  Patient confirmed a history of COPD and current smoking history stating she is not on any supplemental oxygen at baseline.  ED course: Patient received cefepime due to concerns for a left lower lobe infiltrate, 500 mL NS bolus & IV fluids at 150 mL an hour Initial vitals: Afebrile at 98, tachypneic at 22, tachycardic at 108, BP 127/72 & 99% on BiPAP Significant labs: Serum bicarb 15, hyperglycemic at 350, troponin 70 > 745 > 1838, BNP 222.6, VBG revealed metabolic acidosis: 5.10/25/852/77.8, leukocytosis at 40.2, lactic acidosis 2.5> 2.2 > 2.5  TRH hospitalist were consulted for admission.  Around midnight PCCM was called due to patient's deteriorating respiratory status.  Per ED provider Dr. Karma Greaser and care RN the patient complained that " I can't breathe", was given a dose of Ativan and became somnolent on BiPAP.  Coarse crackles auscultated bilaterally, and the decision was made to emergently intubate the patient and place her on mechanical ventilation. PCCM consulted for management  Significant Hospital Events: Including procedures, antibiotic start and stop dates in addition to other pertinent events   . 09/10/2020-patient admits admitted with hospitalist service for COPD exacerbation and left lower lobe pneumonia . 09/11/2020-overnight patient had suspected flash pulmonary edema, dyspnea followed by  somnolence requiring emergent intubation and mechanical ventilation.  Admitted to ICU . 09/12/20- patient failed SBT , she had blood tinged secretions fromETT and aggitation, family was at bedside and we agreed on giving her more time and try again.  . 09/14/20- Failed SBT (became hypoxic with sats mid 80's, increased WOB and assessory muscle use, HR 140-160's). Will diurese today and add Metoprolol.  Tracheal aspirate from 4/30 with KLEBSIELLA PNEUMONIAE (resistant to Ampicillin)   . 09/15/20- Failed SBT (increased WOB and assessory muscle use, RR 40's, HR 140-150's, Hypertensive); plan to add scheduled PO Klonopin and Oxycodone, Lasix 40 mg IV x1, consult Palliative Care . 09/16/20- Worsening Leukocytosis up to 21.7, low grade fever overnight, increased secretions overnight, will repeat Tracheal aspirate, remove central line, add mucinex, plan for SBT . 09/17/20-[name] passed SBT in AM and following commands still having some moderate secretions  Interim History / Subjective:  Overnight with low grade fever of 100.4, increased secretions Worsening Leukocytosis up to 21.7 Obtain repeat Tracheal aspirate, remove central line as not on vasopressors Diuresed 2.3 L yesterday (+ 1.3L since admit), creatinine remains stable Hemodynamically stable, afebrile this morning   Objective   Blood pressure (!) 110/53, pulse (!) 105, temperature 98.6 F (37 C), resp. rate 18, height $RemoveBe'4\' 11"'kmlsQszmc$  (1.499 m), weight 66.4 kg, SpO2 98 %.    Vent Mode: PSV FiO2 (%):  [30 %-40 %] 30 % Set Rate:  [15 bmp] 15 bmp Vt Set:  [500 mL] 500 mL PEEP:  [5 cmH20] 5 cmH20 Pressure Support:  [5 cmH20-9 cmH20] 5 cmH20 Plateau Pressure:  [15 cmH20-18 cmH20] 18 cmH20   Intake/Output Summary (Last 24 hours) at 09/17/2020 0929 Last data filed at 09/17/2020 0700 Gross per 24  hour  Intake 4174.46 ml  Output 4100 ml  Net 74.46 ml   Filed Weights   09/14/20 0500 09/15/20 0500 09/17/20 0445  Weight: 68.1 kg 68.3 kg 66.4 kg     Examination: General: Critically ill appearing female, laying in bed, intubated/sedated. Following commands. Able to lift head off bed.  HEENT: Atraumatic, normocephalic, neck supple, no JVD, pupils PERRLA Neuro: Sedated, does not follow commands, pupils PERRLA CV: Tachycardia, regular rhythm, S1-S2, no murmurs, rubs, gallops, 2+ distal pulses Pulm: Coarse breath sounds bilaterally, synchronous with the vent, even, no assessory muscle use GI: Soft, nondistended, nontender, no guarding or rebound tenderness, bowel sounds positive x4 Skin: Limited exam-warm and dry.  No obvious rashes, lesions, ulcerations Extremities: Warm and dry.  1+ edema bilateral lower extremities, no deformities   Labs/imaging that I have personally reviewed  (right click and "Reselect all SmartList Selections" daily)  Labs 09/15/2020: WBC 21.4, hemoglobin 11.3, glucose 141, BUN 60, creatinine 0.54 Chest x-ray 09/16/2020>>Endotracheal tube, NG tube, right IJ line in stable position. Heart size normal. Persistent but improved bilateral interstitial infiltrates. Tiny left pleural effusion cannot be excluded. No pneumothorax. MRI brain 09/13/2020:No acute brain finding. Mild chronic small-vessel ischemic change of the white matter, fairly typical for age.  Assessment & Plan:  Acute hypoxic respiratory failure secondary to Acute COPD Exacerbation & left lung multifocal pneumonia -Full vent support, continue lung protective ventilation -Wean FiO2 and PEEP as tolerated to maintain O2 sats 88 to 92% -Follow intermittent chest x-ray and ABG as needed -Spontaneous breathing trials when respiratory parameters met  -Implement VAP bundle -Bronchodilators -IV (Solu-Medrol 40 mg daily) and inhaled Steroids -Continue Cefazolin for total of 7 days.  Sepsis due to left lung multifocal pneumonia>> tracheal aspirate on 4/30 with KLEBSIELLA PNEUMONIAE (resistant to Ampicillin) -Monitor fever curve -Trend WBCs -Follow cultures as  above -Continue Cefazolin (plan for 7 day total course of ABX, to complete on 5/5) -Worsening leukocytosis this morning and increased secretions ~ obtain repeat Tracheal Aspirate 5/5 -Ancef total abx course of 7 days  Acute systolic congestive heart failure Acute non-ST elevation MI New diagnosis of A. fib with RVR -Continuous cardiac monitoring -Patient's ejection fraction on echocardiogram is 30% -Diuresis as renal function and BP permits >> currently on 40 mg IV Lasix daily -Cardiology following, input is appreciated -Milrinone has been tapered off -Patient completed therapy with IV heparin -Continue aspirin Plavix and statin -She will need left heart cath once stable -Continue Metoprolol 12.5 mg PO BID  Acute metabolic/toxic encephalopathy Sedation needs in setting of Mechanical Ventilation -Maintain RASS goal of 0 to -1 -  Precedex as needed  -Continue Klonopin, continue Oxycodone to 7.5 mg q6h -Daily wake up assessment -CT Head at admission negative -MRI Brain 09/13/20 negative -Continue Aspirin, Plavix, Atorvastatin -Provide supportive care    Best practice (right click and "Reselect all SmartList Selections" daily)  Diet:  NPO, tube feeds Pain/Anxiety/Delirium protocol (if indicated): Yes (RASS goal -1) VAP protocol (if indicated): Yes DVT prophylaxis: SCD (chemical prophylaxis held due to previous hemoptysis) GI prophylaxis: H2B Glucose control:  SSI Yes Central venous access: discontinue Right IJ CVC today 5/5 Arterial line:  N/A Foley: N/A Mobility:  bed rest  PT consulted: N/A Code Status:  full code Disposition: ICU  Myself and Dr. Loanne Drilling updated pt's 2 daughters at bedside 09/16/20.  Labs   CBC: Recent Labs  Lab 09/11/20 0437 09/12/20 0441 09/13/20 0439 09/14/20 0523 09/15/20 0552 09/16/20 0433 09/16/20 0500 09/17/20 0406  WBC 30.7*   < >  18.1* 10.9* 13.6* 21.7* 21.4* 28.8*  NEUTROABS 24.5*  --  13.8* 8.1*  --   --  14.1*  --   HGB 14.2   < >  12.5 11.3* 10.7* 11.3* 11.3* 12.3  HCT 44.8   < > 39.9 36.5 35.0* 36.7 37.0 40.4  MCV 97.0   < > 96.8 96.8 98.3 97.1 97.9 98.1  PLT 324   < > 277 215 218 250 256 280   < > = values in this interval not displayed.    Basic Metabolic Panel: Recent Labs  Lab 09/13/20 0439 09/14/20 0523 09/15/20 0552 09/16/20 0433 09/17/20 0004 09/17/20 0406  NA 141 142 143 142 138 139  K 3.7 4.0 4.5 4.2 4.4 4.3  CL 112* 112* 114* 108 99 100  CO2 19* 21* $Remov'25 25 28 28  'XmXcyL$ GLUCOSE 104* 136* 120* 141* 122* 107*  BUN 49* 66* 62* 60* 65* 64*  CREATININE 0.76 0.72 0.62 0.54 0.70 0.66  CALCIUM 8.7* 8.6* 8.3* 8.6* 8.5* 8.9  MG 2.3 2.4 2.4 2.2 2.1 2.2  PHOS 3.9 5.1* 4.1 3.8  --  4.5   GFR: Estimated Creatinine Clearance: 50.4 mL/min (by C-G formula based on SCr of 0.66 mg/dL). Recent Labs  Lab 09/10/20 1954 09/10/20 2050 09/11/20 1720 09/11/20 2011 09/12/20 0441 09/12/20 1159 09/12/20 1404 09/13/20 0439 09/14/20 0523 09/15/20 0552 09/16/20 0433 09/16/20 0500 09/17/20 0406  PROCALCITON 0.12  --   --   --   --  0.74  --  0.45  --   --   --   --   --   WBC  --    < >  --   --    < >  --   --  18.1*   < > 13.6* 21.7* 21.4* 28.8*  LATICACIDVEN  --    < > 2.5* 1.9  --  1.6 1.3  --   --   --   --   --   --    < > = values in this interval not displayed.    Liver Function Tests: Recent Labs  Lab 09/10/20 1417 09/11/20 0437 09/11/20 1720  AST 37 42* 23  ALT 42 58*  --   ALKPHOS 93 97  --   BILITOT 0.7 0.8  --   PROT 7.0 6.9  --   ALBUMIN 3.7 3.4*  --    No results for input(s): LIPASE, AMYLASE in the last 168 hours. No results for input(s): AMMONIA in the last 168 hours.  ABG    Component Value Date/Time   PHART 7.38 09/15/2020 0500   PCO2ART 41 09/15/2020 0500   PO2ART 123 (H) 09/15/2020 0500   HCO3 24.3 09/15/2020 0500   TCO2 23 01/14/2009 0327   ACIDBASEDEF 0.8 09/15/2020 0500   O2SAT 98.7 09/15/2020 0500     Coagulation Profile: Recent Labs  Lab 09/10/20 1743  INR 1.4*     Cardiac Enzymes: Recent Labs  Lab 09/11/20 1720  CKMB 26.6*    HbA1C: Hgb A1c MFr Bld  Date/Time Value Ref Range Status  09/11/2020 04:37 AM 6.4 (H) 4.8 - 5.6 % Final    Comment:    (NOTE) Pre diabetes:          5.7%-6.4%  Diabetes:              >6.4%  Glycemic control for   <7.0% adults with diabetes   10/02/2019 12:28 PM 6.4 (H) 4.8 - 5.6 % Final    Comment:  Prediabetes: 5.7 - 6.4          Diabetes: >6.4          Glycemic control for adults with diabetes: <7.0     CBG: Recent Labs  Lab 09/16/20 1617 09/16/20 1913 09/16/20 2306 09/17/20 0348 09/17/20 0731  GLUCAP 198* 189* 145* 113* 147*   Pt is critically ill requiring mechanical ventilation.  Prognosis is guarded.  Critical Care Time: 40 minutes  Newell Coral DO Internal Medicine/Pediatrics Pulmonary and Critical Care Fellow PGY-7

## 2020-09-17 NOTE — Progress Notes (Signed)
Progress Note  Patient Name: Kathryn Garrett Date of Encounter: 09/17/2020  Primary Cardiologist: Irish Lack  Subjective   She remains intubated and sedated. Milrinone has been tapered off as of 5/2. Documented UOP 361 mL for the past 24 hours with a net + 3.1 L for the admission. T-max 99.9 over the past 24 hours. Leukocytosis worsening. Renal function stable. CT head overnight not acute. Passed SBT this morning. With metoprolol, she became hypotensive leading this to be decreased. BP stabilized. Shakes her head "no" to chest pain.   Inpatient Medications    Scheduled Meds: . aspirin  81 mg Per Tube QHS  . budesonide (PULMICORT) nebulizer solution  0.25 mg Nebulization BID  . chlorhexidine gluconate (MEDLINE KIT)  15 mL Mouth Rinse BID  . Chlorhexidine Gluconate Cloth  6 each Topical QHS  . clonazePAM  0.5 mg Per Tube BID  . clopidogrel  75 mg Per Tube QHS  . docusate  100 mg Per Tube BID  . fentaNYL (SUBLIMAZE) injection  25 mcg Intravenous Once  . fluticasone  1 spray Each Nare Daily  . free water  30 mL Per Tube Q4H  . furosemide  40 mg Intravenous Daily  . guaiFENesin  10 mL Per Tube Q4H  . insulin aspart  0-15 Units Subcutaneous Q4H  . ipratropium-albuterol  3 mL Nebulization Q6H  . levothyroxine  88 mcg Per Tube Q0600  . mouth rinse  15 mL Mouth Rinse 10 times per day  . metoprolol tartrate  12.5 mg Per Tube BID  . multivitamin  15 mL Per Tube Daily  . oxyCODONE  7.5 mg Per Tube Q6H  . pantoprazole sodium  40 mg Per Tube Daily  . polyethylene glycol  17 g Per Tube Daily  . [START ON 09/18/2020] predniSONE  20 mg Per Tube Q breakfast  . rosuvastatin  20 mg Per Tube Daily  . vecuronium  10 mg Intravenous Once   Continuous Infusions: . sodium chloride 10 mL/hr at 09/17/20 0700  . dexmedetomidine (PRECEDEX) IV infusion Stopped (09/17/20 1110)  . feeding supplement (VITAL HIGH PROTEIN) Stopped (09/17/20 1141)  . fentaNYL infusion INTRAVENOUS Stopped (09/16/20 2346)    PRN Meds: acetaminophen **OR** acetaminophen, albuterol, artificial tears, docusate, metoprolol tartrate, midazolam, polyethylene glycol   Vital Signs    Vitals:   09/17/20 1145 09/17/20 1200 09/17/20 1215 09/17/20 1230  BP: 139/84 (!) 77/59 117/84 (!) 144/84  Pulse: (!) 112 (!) 107 (!) 108 (!) 109  Resp: 19 (!) $Remo'24 20 19  'VSyoT$ Temp: 99.14 F (37.3 C) 99.68 F (37.6 C) 99.32 F (37.4 C) 99.5 F (37.5 C)  TempSrc:      SpO2: 100% 100% 100% 100%  Weight:      Height:        Intake/Output Summary (Last 24 hours) at 09/17/2020 1244 Last data filed at 09/17/2020 0900 Gross per 24 hour  Intake 3596.24 ml  Output 3200 ml  Net 396.24 ml   Filed Weights   09/14/20 0500 09/15/20 0500 09/17/20 0445  Weight: 68.1 kg 68.3 kg 66.4 kg    Telemetry    SR with sinus tachycardia, PACs and PVCs - Personally Reviewed  ECG    No new tracings - Personally Reviewed  Physical Exam   GEN: No acute distress. Ill appearing.   Neck: No JVD. Cardiac: RRR, no murmurs, rubs, or gallops.  Respiratory: Diminished, coarse and vented breath sounds bilaterally.  GI: Soft, nontender, non-distended.   MS: No edema; No deformity. Neuro:  Intubated and sedated.  Psych: Intubated and sedated, will shake head yes or no.  Labs    Chemistry Recent Labs  Lab 09/10/20 1417 09/11/20 0437 09/11/20 1720 09/12/20 0441 09/16/20 0433 09/17/20 0004 09/17/20 0406  NA 136 142  --    < > 142 138 139  K 3.8 3.3*  --    < > 4.2 4.4 4.3  CL 106 108  --    < > 108 99 100  CO2 15* 19*  --    < > $R'25 28 28  'tF$ GLUCOSE 350* 182*  --    < > 141* 122* 107*  BUN 22 26*  --    < > 60* 65* 64*  CREATININE 0.89 0.87  --    < > 0.54 0.70 0.66  CALCIUM 8.8* 8.2*  --    < > 8.6* 8.5* 8.9  PROT 7.0 6.9  --   --   --   --   --   ALBUMIN 3.7 3.4*  --   --   --   --   --   AST 37 42* 23  --   --   --   --   ALT 42 58*  --   --   --   --   --   ALKPHOS 93 97  --   --   --   --   --   BILITOT 0.7 0.8  --   --   --   --   --    GFRNONAA >60 >60  --    < > >60 >60 >60  ANIONGAP 15 15  --    < > $R'9 11 11   'HY$ < > = values in this interval not displayed.     Hematology Recent Labs  Lab 09/16/20 0433 09/16/20 0500 09/17/20 0406  WBC 21.7* 21.4* 28.8*  RBC 3.78* 3.78* 4.12  HGB 11.3* 11.3* 12.3  HCT 36.7 37.0 40.4  MCV 97.1 97.9 98.1  MCH 29.9 29.9 29.9  MCHC 30.8 30.5 30.4  RDW 15.9* 16.0* 15.5  PLT 250 256 280    Cardiac EnzymesNo results for input(s): TROPONINI in the last 168 hours. No results for input(s): TROPIPOC in the last 168 hours.   BNP Recent Labs  Lab 09/11/20 0437 09/11/20 1720 09/12/20 0441  BNP 1,009.8* 1,298.4* 970.2*     DDimer  Recent Labs  Lab 09/11/20 0437  DDIMER 3.47*     Radiology    DG Chest Port 1 View  Result Date: 09/16/2020 IMPRESSION: 1.  Lines and tubes in stable position. 2.  Persistent but improved bilateral interstitial infiltrates. Electronically Signed   By: Florien   On: 09/16/2020 07:44   DG Chest Port 1 View  Result Date: 09/15/2020 IMPRESSION: Lines and tubes as above. Bilateral heterogeneous opacities in the lungs with fairly significant interval clearing of the left lung periphery. Trace residual left effusion. Aortic Atherosclerosis (ICD10-I70.0). Electronically Signed   By: Lovena Le M.D.   On: 09/15/2020 03:48    Cardiac Studies   2D echo 09/11/2020: 1. Recommend limited echo with iv contrast agent (definity) for addequate  assessment of LVEF and wall motion.. Left ventricular ejection fraction,  by estimation, is 25 to 30%. The left ventricle has severely decreased  function. Left ventricular  endocardial border not optimally defined to evaluate regional wall motion.  Left ventricular diastolic parameters are indeterminate.  2. Right ventricular systolic function is normal. The right ventricular  size  is normal.  3. The mitral valve was not well visualized. Mild mitral valve  regurgitation.  4. The aortic valve was not well  visualized. Aortic valve regurgitation  is not visualized.  __________  LHC 05/2016:  Ost RCA lesion, 30 %stenosed.  LM lesion, 30 %stenosed.  Ost LAD lesion, 50 %stenosed - looks similar to prior catheterization. FFR 0.87  The left ventricular systolic function is normal.  LV end diastolic pressure is normal.  The left ventricular ejection fraction is 55-65% by visual estimate.  There is mild (2+) mitral regurgitation.  _______CULPRIT LESION_____________  Prox RCA Overlapped BMS-DES stents, 99 % -n-stent re-stenosed.  PTCA with 3.0 mm [place] Balloon with Post-dilation using 3.5 mm [place] Balloon was perfromed. Post intervention, there is a 0% residual stenosis.   Recurrent in-stent restenosis of the overlapped stent (BMS and DES) in proximal RCA treated with aggressive angioplasty. Repeat FFR on ostial LAD confirms this is not likely significant.   Plan:  She'll be transferred to 6 Central post procedure unit for sheath removal and post PCI care.  Continue aggressive risk factor modification  Continue aspirin and Plavix lifelong  Smoking cessation counseling is mandatory to maintain stent patency __________  LHC 01/2016:  Ost LAD to Prox LAD lesion, 40 %stenosed. Similar to prior cath.  The left ventricular systolic function is normal.  LV end diastolic pressure is normal.  The left ventricular ejection fraction is 55-65% by visual estimate.  There is no aortic valve stenosis.  Prox RCA lesion, 75 %stenosed, in-stent restenosis.  Post intervention with 3.0 x 10 Angiosculpt inflated to 3.5 mm, there is a 0% residual stenosis.   Continue aggressive secondary prevention.  Clopidogrel restarted. _________  2D echo 09/2015: - Left ventricle: The cavity size was normal. Systolic function was  normal. The estimated ejection fraction was in the range of 60%  to 65%. Wall motion was normal; there were no regional wall  motion abnormalities. Doppler parameters  are consistent with  abnormal left ventricular relaxation (grade 1 diastolic  dysfunction).  - Right ventricle: Systolic function was normal.  - Pulmonary arteries: Systolic pressure was within the normal  range.   Impressions:   - Normal study.   Patient Profile     75 y.o. female with history of CAD with VF arrest s/p PCI to the RCA in 2017 s/p PTCA to the RCA for ISR in 2018, chronic combined systolic and diastolic CHF, COPD with ongoing tobacco use, HTN, HLD, anemia, and narcotic abuse on Suboxone therapy admitted with acute hypoxic respiratory failure requiring mechanical ventilation secondary to COPD exacerbation Klebsiella PNA and acute on chronic combined CHF with transient ST elevation with subsequent rise on HS-Tn to 2455 and echo demonstrating new LV dysfunction requiring augmentation with milrinone for diuresis.   Assessment & Plan    1. Acute on chronic combined systolic and diastolic CHF: -Previously required milrinone for augmentation of IV diuresis, now off milrinone as of late in the day on 5/2 -IV Lasix 40 mg daily, as BP allows, with KCl repletion as indicated  -Continue lower dose Lopressor for now given hypotension noted with higher dose, prior to discharge, would look to consolidate to Toprl XL or change to Coreg given her cardiomyopathy  -Escalate GDMT throughout her admission as able -Daily weights -Strict I/O  2. CAD involving the native coronary arteries with NSTEMI: -Unable to assess symptoms -Elevated troponin may represent supply demand mismatch ischemia in the context of known CAD with her acute multifactorial illness -She  has completed IV heparin -ASA and Plavix -Crestor  -Once her respiratory status has improved she will need a cardiac cath  3. Acute respiratory failure with hypoxia with sepsis: -Multifactorial including PNA, COPD exacerbation, and heart failure -Gentle diuresis  -Further management per CCM  4. HLD: -LDL 43 in 09/2019 -PTA  Crestor     For questions or updates, please contact [place] Please consult [email or web address] for contact info under Cardiology/STEMI.    Signed, Christell Faith, PA-C [place] Pager: [phone] 09/17/2020, 12:44 PM

## 2020-09-17 NOTE — TOC Initial Note (Addendum)
Transition of Care [facility]) - Initial/Assessment Note    Patient Details  Name: Kathryn Garrett MRN: [id number] Date of Birth: 1946/04/30  Transition of Care [facility]) CM/SW Contact:    Ova Freshwater Phone Number: [phone] 09/17/2020, 4:02 PM  Clinical Narrative:                  Patient presents to [facility] ED due to SOB, patient was at work.  Main contacts are [name] (Daughter) [phone], Lisabeth Devoid (Daughter) [phone] and son Norlene Campbell. CSW spoke with Ms. Rathbone to explained the role of TOC in patient care.  Ms. Carmie End stated while the patient is unable to make decisions she and her siblings will make them together.  CSW went over next steps toward d/c and stated I would continue following the patient so assist with d/c plans. Ms. Carmie End verbalized understanding.   Palliative care was consulted and met with family but family and role pf palliative care was explained to the family.  For the time being family has declined to meet with palliative care.  Patient remains in ICU, passed STB, she is following commands but is still experiencing moderate secretions. Patient extubated and placed on 3L nasal cannula.    Barriers to Discharge: Continued Medical Work up   Patient Goals and CMS Choice        Expected Discharge Plan and Services   In-house Referral: Clinical Social Work   Post Acute Care Choice:  [facility]) Living arrangements for the past 2 months: Single Family Home                                      Prior Living Arrangements/Services Living arrangements for the past 2 months: Single Family Home Lives with:: Self Patient language and need for interpreter reviewed:: Yes Do you feel safe going back to the place where you live?: Yes      Need for Family Participation in Patient Care: Yes (Comment) Care giver support system in place?: Yes (comment)      Activities of Daily Living Home Assistive Devices/Equipment: Gilford Rile  (specify type) ADL Screening (condition at time of admission) Patient's cognitive ability adequate to safely complete daily activities?: Yes Is the patient deaf or have difficulty hearing?: No Does the patient have difficulty seeing, even when wearing glasses/contacts?: No Does the patient have difficulty concentrating, remembering, or making decisions?: No Patient able to express need for assistance with ADLs?: Yes Does the patient have difficulty dressing or bathing?: No Independently performs ADLs?: Yes (appropriate for developmental age) Does the patient have difficulty walking or climbing stairs?: Yes Weakness of Legs: Both Weakness of Arms/Hands: None  Permission Sought/Granted Permission sought to share information with : Facility Sport and exercise psychologist    Share Information with NAME: [name] Daughter [phone], Lisabeth Devoid Daughter [phone]           Emotional Assessment Appearance:: Appears stated age Attitude/Demeanor/Rapport: Unable to Assess Affect (typically observed): Unable to Assess Orientation: : Oriented to Self,Oriented to Place   Psych Involvement: No (comment)  Admission diagnosis:  Acute respiratory failure (HCC) [J96.00] Acute respiratory distress [R06.03] Acute exacerbation of chronic obstructive pulmonary disease (COPD) ([place]) [J44.1] Pneumonia of left lower lobe due to infectious organism [J18.9] Patient Active Problem List   Diagnosis Date Noted  . Acute respiratory failure ([place]) 09/11/2020  . HFrEF (heart failure with reduced ejection fraction) ([place])   . Acute exacerbation  of chronic obstructive pulmonary disease (COPD) ([place]) 09/10/2020  . Severe sepsis ([place]) 09/10/2020  . NSTEMI (non-ST elevated myocardial infarction) ([place]) 09/10/2020  . Hyperglycemia 09/10/2020  . SOB (shortness of breath) 09/10/2020  . Chronic obstructive pulmonary disease ([place]) 03/05/2018  . History of opioid abuse ([place]) 03/05/2018  . Nocturnal hypoxemia  10/30/2017  . Peripheral arterial disease ([place]) 08/30/2016  . Chest pain 06/04/2016  . S/P PTCA (percutaneous transluminal coronary angioplasty) 02/10/16 to RCA lesion for in stent restenois 02/11/2016  . Unstable angina ([place])   . Hyperlipidemia LDL goal <70 01/28/2016  . Presence of coronary angioplasty implant and graft 10/15/2015  . Tobacco abuse 10/15/2015  . CAP (community acquired pneumonia) 09/24/2015  . CAD (coronary artery disease) 09/24/2015  . HTN (hypertension) 09/24/2015  . Lung nodule, solitary 03/31/2014  . Liver nodule 03/31/2014   PCP:  Imagene Riches, NP Pharmacy:   [facility] [place], [place] - [address] [place] [address] Phone: [phone] Fax: [phone]  [id number] [address], [place] [address] [place] [address] Phone: [phone] Fax: [phone]     Social Determinants of Health (SDOH) Interventions    Readmission Risk Interventions No flowsheet data found.

## 2020-09-18 DIAGNOSIS — J441 Chronic obstructive pulmonary disease with (acute) exacerbation: Secondary | ICD-10-CM | POA: Diagnosis not present

## 2020-09-18 LAB — BASIC METABOLIC PANEL
Anion gap: 11 (ref 5–15)
BUN: 52 mg/dL — ABNORMAL HIGH (ref 8–23)
CO2: 27 mmol/L (ref 22–32)
Calcium: 9 mg/dL (ref 8.9–10.3)
Chloride: 101 mmol/L (ref 98–111)
Creatinine, Ser: 0.51 mg/dL (ref 0.44–1.00)
GFR, Estimated: >60 mL/min (ref >60)
Glucose, Bld: 92 mg/dL (ref 70–99)
Potassium: 3.3 mmol/L — ABNORMAL LOW (ref 3.5–5.1)
Sodium: 139 mmol/L (ref 135–145)

## 2020-09-18 LAB — GLUCOSE, CAPILLARY
Glucose-Capillary: 100 mg/dL — ABNORMAL HIGH (ref 70–99)
Glucose-Capillary: 102 mg/dL — ABNORMAL HIGH (ref 70–99)
Glucose-Capillary: 106 mg/dL — ABNORMAL HIGH (ref 70–99)
Glucose-Capillary: 137 mg/dL — ABNORMAL HIGH (ref 70–99)
Glucose-Capillary: 89 mg/dL (ref 70–99)
Glucose-Capillary: 98 mg/dL (ref 70–99)

## 2020-09-18 LAB — CBC
HCT: 43.2 % (ref 36.0–46.0)
Hemoglobin: 13.5 g/dL (ref 12.0–15.0)
MCH: 30.3 pg (ref 26.0–34.0)
MCHC: 31.3 g/dL (ref 30.0–36.0)
MCV: 97.1 fL (ref 80.0–100.0)
Platelets: 358 10*3/uL (ref 150–400)
RBC: 4.45 MIL/uL (ref 3.87–5.11)
RDW: 15.7 % — ABNORMAL HIGH (ref 11.5–15.5)
WBC: 34.3 10*3/uL — ABNORMAL HIGH (ref 4.0–10.5)
nRBC: 0 % (ref 0.0–0.2)

## 2020-09-18 LAB — MAGNESIUM: Magnesium: 2.3 mg/dL (ref 1.7–2.4)

## 2020-09-18 LAB — PHOSPHORUS: Phosphorus: 3.5 mg/dL (ref 2.5–4.6)

## 2020-09-18 MED ORDER — SACUBITRIL-VALSARTAN 24-26 MG PO TABS
1.0000 | ORAL_TABLET | Freq: Two times a day (BID) | ORAL | Status: DC
  Administered 2020-09-19: 1 via ORAL
  Filled 2020-09-18 (×4): qty 1

## 2020-09-18 MED ORDER — POTASSIUM CHLORIDE 10 MEQ/100ML IV SOLN
10.0000 meq | INTRAVENOUS | Status: AC
  Administered 2020-09-18 (×6): 10 meq via INTRAVENOUS
  Filled 2020-09-18 (×7): qty 100

## 2020-09-18 NOTE — Evaluation (Signed)
Clinical/Bedside Swallow Evaluation Patient Details  Name: Kathryn Garrett MRN: [id number] Date of Birth: 12/14/1945  Today's Date: 09/18/2020 Time: SLP Start Time (ACUTE ONLY): 1029 SLP Stop Time (ACUTE ONLY): 1041 SLP Time Calculation (min) (ACUTE ONLY): 12 min  Past Medical History:  Past Medical History:  Diagnosis Date  . Anemia, mild   . Arthritis    "qwhere" (02/10/2016)  . CAD (coronary artery disease)    a. VF arrest 01/2009/CAD with inferoposterior MI s/p aspiration thrombectomy/BMS of RCA at that time. b. ISR of BMS s/p DES to RCA 06/2010 with moderate LM/LAD disease not significant by FFR. c. s/p angiosculpt PTCA to prox RCA 01/2016 for ISR. d. Aggressive PTCA to prox RCA for ISR 05/2016.  . Cardiac arrest ([place]) 01/2009   a. in setting of inf-post STEMI 01/2009 (VF).  . Chest pain 06/04/2016  . Chronic bronchitis ([place])   . Chronic combined systolic and diastolic CHF (congestive heart failure) ([place])    a. EF 40-45% by cath 2010. b. 60-65% with grade 1 DD by echo 09/2015  . COPD (chronic obstructive pulmonary disease) ([place])   . Hyperlipidemia 01/28/2016  . Hyperlipidemia LDL goal <70 01/28/2016  . Hypertension   . Lung mass   . MI (myocardial infarction) ([place]) 01/2009  . Narcotic abuse ([place])    pt now taking Suboxone tid  . Pleural effusion on left 09/24/2015  . Pneumonia 06/2014; 09/2015  . Pre-diabetes   . Pulmonary nodule   . PVD (peripheral vascular disease) (HCC)    mild atherosclerosis of infrarenal aorta, 25% ostial left renal artery stenosis, 50% ostial right common iliac by cath 2010  . S/P PTCA (percutaneous transluminal coronary angioplasty) 02/10/16 to RCA lesion for in stent restenois 02/11/2016  . Subclavian artery stenosis (HCC)    a. >50% by duplex 05/2016.  . Tobacco abuse   . Unstable angina [facility])    Past Surgical History:  Past Surgical History:  Procedure Laterality Date  . ABDOMINAL HYSTERECTOMY    . ANKLE FRACTURE SURGERY Right 2000s  . CARDIAC  CATHETERIZATION N/A 02/10/2016   Procedure: Left Heart Cath and Coronary Angiography;  Surgeon: Jettie Booze, MD;  Location: [place] CV [facility];  Service: Cardiovascular;  Laterality: N/A;  . CARDIAC CATHETERIZATION N/A 02/10/2016   Procedure: Coronary Balloon Angioplasty;  Surgeon: Jettie Booze, MD;  Location: [place] CV LAB;  Service: Cardiovascular;  Laterality: N/A;  instent RCA  . CARDIAC CATHETERIZATION N/A 06/05/2016   Procedure: Left Heart Cath and Coronary Angiography;  Surgeon: Leonie Man, MD;  Location: [place] [facility] LAB;  Service: Cardiovascular;  Laterality: N/A;  . CARDIAC CATHETERIZATION N/A 06/05/2016   Procedure: Coronary Balloon Angioplasty;  Surgeon: Leonie Man, MD;  Location: [place] CV LAB;  Service: Cardiovascular;  Laterality: N/A;  . CHEST TUBE INSERTION N/A 10/08/2015   Procedure: PLEURX CATH REMOVAL;  Surgeon: [name], MD;  Location: [facility] ORS;  Service: Thoracic;  Laterality: N/A;  . CORONARY ANGIOPLASTY WITH STENT PLACEMENT  01/2009; 2012  . FRACTURE SURGERY    . VIDEO ASSISTED THORACOSCOPY (VATS)/THOROCOTOMY Left 09/29/2015   Procedure: PREOP BRONCHOSCOPY, LEFT THORACOSCOPY, POSSIBLE THORACOTOMY, PLEURAL BIOPSY, TALC;  Surgeon: [name], MD;  Location: [facility] ORS;  Service: General;  Laterality: Left;   HPI:  09/10/2020-patient admits admitted with hospitalist service for COPD exacerbation and left lower lobe pneumonia. Tracheal aspirate from 4/30 with KLEBSIELLA PNEUMONIAE (resistant to Ampicillin). Head CT 09/17/2020 No acute intracranial abnormality. If there is persisting concern for acute  infarction, MRI is more sensitive and specific for early features of ischemia. Past medical history including CAD s/p stenting, CHF, COPD who is admitted with COPD exacerbation / pneumonia / decompensated heart failure / NSTEMI.  Pt intubated 09/11/2020 thru 09/17/2020.   Assessment / Plan / Recommendation Clinical Impression  Pt presents with likely  reversible dysphagia s/p 7 day intubation. Pt has hoarse voice, reports a sore throat and pain with swallowing. This is further complicated by decreased alertness/confusion. When consuming ice chips via spoon, she exhibits decreased oral manipulation and coughing after swallow. Pt's cough is congested but appeared productive. Pt's RR went up to 28. Currently, pt is at a very high risk of aspiration, therefore recommend NPO at this time. Pt's daughters were present and all questions were answered to their satisfaction. ST to follow for possible diet advancement. SLP Visit Diagnosis: Dysphagia, oropharyngeal phase (R13.12)    Aspiration Risk  Severe aspiration risk    Diet Recommendation NPO   Medication Administration: Via alternative means    Other  Recommendations Oral Care Recommendations: Oral care QID   Follow up Recommendations  (TBD)      Frequency and Duration min 2x/week  2 weeks       Prognosis Prognosis for Safe Diet Advancement: Fair Barriers to Reach Goals: Cognitive deficits;Severity of deficits;Time post onset      Swallow Study   General Date of Onset: 09/10/20 HPI: 09/10/2020-patient admits admitted with hospitalist service for COPD exacerbation and left lower lobe pneumonia. Tracheal aspirate from 4/30 with KLEBSIELLA PNEUMONIAE (resistant to Ampicillin). Head CT 09/17/2020 No acute intracranial abnormality. If there is persisting concern for acute infarction, MRI is more sensitive and specific for early features of ischemia. Past medical history including CAD s/p stenting, CHF, COPD who is admitted with COPD exacerbation / pneumonia / decompensated heart failure / NSTEMI.  Pt intubated 09/11/2020 thru 09/17/2020. Type of Study: Bedside Swallow Evaluation Previous Swallow Assessment: none in chart Diet Prior to this Study: NPO Temperature Spikes Noted: No Respiratory Status: Nasal cannula History of Recent Intubation: Yes Length of Intubations (days): 7 days Date  extubated: 09/17/20 Behavior/Cognition: Cooperative;Pleasant mood;Confused;Lethargic/Drowsy;Requires cueing;Doesn't follow directions Oral Cavity Assessment: Within Functional Limits Oral Care Completed by SLP: Recent completion by staff Oral Cavity - Dentition: Edentulous Self-Feeding Abilities: Total assist Patient Positioning: Upright in bed Baseline Vocal Quality: Low vocal intensity;Hoarse;Breathy Volitional Cough: Cognitively unable to elicit Volitional Swallow: Unable to elicit    Oral/Motor/Sensory Function Overall Oral Motor/Sensory Function: Generalized oral weakness   Ice Chips Ice chips: Impaired Presentation: Spoon Oral Phase Impairments: Reduced lingual movement/coordination;Poor awareness of bolus Oral Phase Functional Implications: Oral holding;Prolonged oral transit Pharyngeal Phase Impairments: Suspected delayed Swallow;Cough - Delayed;Wet Vocal Quality;Multiple swallows;Change in Vital Signs   Thin Liquid Thin Liquid: Not tested    Nectar Thick Nectar Thick Liquid: Not tested   Honey Thick Honey Thick Liquid: Not tested   Puree Puree: Not tested   Solid     Solid: Not tested     Nyellie Yetter B. Rutherford Nail M.S., CCC-SLP, Ali Molina Pathologist Rehabilitation Services Office [phone]  Khary Schaben 09/18/2020,1:17 PM

## 2020-09-18 NOTE — Progress Notes (Addendum)
Progress Note  Patient Name: Kathryn Garrett Date of Encounter: 09/18/2020  Primary Cardiologist: Irish Lack  Subjective   Extubated 5/6. Had a brief episode of Afib during the evening of 5/6, none since. Leukocytosis worsening. Documented UOP ~ 1 L for the past 24 hours with net + 2.5 L for the admission. Renal function stable. Potassium 3.3. Metoprolol was briefly decreased on 5/6 due to hypotension, though this was increased during the evening in the setting of Afib with RVR. BP elevated this morning.   Inpatient Medications    Scheduled Meds: . aspirin  81 mg Oral QHS  . budesonide (PULMICORT) nebulizer solution  0.25 mg Nebulization BID  . Chlorhexidine Gluconate Cloth  6 each Topical QHS  . clonazePAM  0.5 mg Oral BID  . clopidogrel  75 mg Oral QHS  . fluticasone  1 spray Each Nare Daily  . furosemide  40 mg Intravenous Daily  . guaiFENesin  10 mL Oral Q4H  . insulin aspart  0-15 Units Subcutaneous Q4H  . ipratropium-albuterol  3 mL Nebulization Q6H  . levothyroxine  88 mcg Oral Q0600  . mouth rinse  15 mL Mouth Rinse BID  . metoprolol tartrate  5 mg Intravenous Q6H  . metoprolol tartrate  25 mg Oral BID  . multivitamin with minerals  1 tablet Oral Daily  . oxyCODONE  7.5 mg Oral Q6H  . pantoprazole sodium  40 mg Oral Daily  . predniSONE  20 mg Oral Q breakfast  . rosuvastatin  20 mg Oral Daily   Continuous Infusions: . sodium chloride 10 mL/hr at 09/17/20 0700  . dexmedetomidine (PRECEDEX) IV infusion Stopped (09/17/20 1416)  . potassium chloride     PRN Meds: acetaminophen **OR** acetaminophen, albuterol, artificial tears, docusate, metoprolol tartrate, morphine injection, polyethylene glycol   Vital Signs    Vitals:   09/18/20 0300 09/18/20 0344 09/18/20 0400 09/18/20 0500  BP: (!) 153/77  (!) 150/86 (!) 161/76  Pulse: 95  (!) 101 (!) 103  Resp: 18  20 (!) 21  Temp:  98.9 F (37.2 C)    TempSrc:  Oral    SpO2: 96%  96% 99%  Weight:      Height:         Intake/Output Summary (Last 24 hours) at 09/18/2020 0721 Last data filed at 09/18/2020 0552 Gross per 24 hour  Intake 1110.17 ml  Output 2100 ml  Net -989.83 ml   Filed Weights   09/14/20 0500 09/15/20 0500 09/17/20 0445  Weight: 68.1 kg 68.3 kg 66.4 kg    Telemetry    SR with sinus tachycardia, PACs and PVCs, brief episode of Afib with RVR 5/6 - Personally Reviewed  ECG    No new tracings - Personally Reviewed  Physical Exam   GEN: No acute distress. Ill appearing.   Neck: JVD unable to be assessed secondary to dressing. Cardiac: RRR, no murmurs, rubs, or gallops.  Respiratory: Diminished breath sounds bilaterally.  GI: Soft, nontender, non-distended.   MS: No edema; No deformity. Neuro:  Intubated and sedated.  Psych: Intubated and sedated, will shake head yes or no.  Labs    Chemistry Recent Labs  Lab 09/11/20 1720 09/12/20 0441 09/17/20 0004 09/17/20 0406 09/18/20 0550  NA  --    < > 138 139 139  K  --    < > 4.4 4.3 3.3*  CL  --    < > 99 100 101  CO2  --    < >  28 28 27   GLUCOSE  --    < > 122* 107* 92  BUN  --    < > 65* 64* 52*  CREATININE  --    < > 0.70 0.66 0.51  CALCIUM  --    < > 8.5* 8.9 9.0  AST 23  --   --   --   --   GFRNONAA  --    < > >60 >60 >60  ANIONGAP  --    < > 11 11 11    < > = values in this interval not displayed.     Hematology Recent Labs  Lab 09/16/20 0500 09/17/20 0406 09/18/20 0550  WBC 21.4* 28.8* 34.3*  RBC 3.78* 4.12 4.45  HGB 11.3* 12.3 13.5  HCT 37.0 40.4 43.2  MCV 97.9 98.1 97.1  MCH 29.9 29.9 30.3  MCHC 30.5 30.4 31.3  RDW 16.0* 15.5 15.7*  PLT 256 280 358    Cardiac EnzymesNo results for input(s): TROPONINI in the last 168 hours. No results for input(s): TROPIPOC in the last 168 hours.   BNP Recent Labs  Lab 09/11/20 1720 09/12/20 0441  BNP 1,298.4* 970.2*     DDimer  No results for input(s): DDIMER in the last 168 hours.   Radiology    DG Chest Port 1 View  Result Date:  09/16/2020 IMPRESSION: 1.  Lines and tubes in stable position. 2.  Persistent but improved bilateral interstitial infiltrates. Electronically Signed   By: Spencer   On: 09/16/2020 07:44   DG Chest Port 1 View  Result Date: 09/15/2020 IMPRESSION: Lines and tubes as above. Bilateral heterogeneous opacities in the lungs with fairly significant interval clearing of the left lung periphery. Trace residual left effusion. Aortic Atherosclerosis (ICD10-I70.0). Electronically Signed   By: Lovena Le M.D.   On: 09/15/2020 03:48    Cardiac Studies   2D echo 09/11/2020: 1. Recommend limited echo with iv contrast agent (definity) for addequate  assessment of LVEF and wall motion.. Left ventricular ejection fraction,  by estimation, is 25 to 30%. The left ventricle has severely decreased  function. Left ventricular  endocardial border not optimally defined to evaluate regional wall motion.  Left ventricular diastolic parameters are indeterminate.  2. Right ventricular systolic function is normal. The right ventricular  size is normal.  3. The mitral valve was not well visualized. Mild mitral valve  regurgitation.  4. The aortic valve was not well visualized. Aortic valve regurgitation  is not visualized.  __________  LHC 05/2016:  Ost RCA lesion, 30 %stenosed.  LM lesion, 30 %stenosed.  Ost LAD lesion, 50 %stenosed - looks similar to prior catheterization. FFR 0.87  The left ventricular systolic function is normal.  LV end diastolic pressure is normal.  The left ventricular ejection fraction is 55-65% by visual estimate.  There is mild (2+) mitral regurgitation.  _______CULPRIT LESION_____________  Prox RCA Overlapped BMS-DES stents, 99 % -n-stent re-stenosed.  PTCA with 3.0 mm [place] Balloon with Post-dilation using 3.5 mm [place] Balloon was perfromed. Post intervention, there is a 0% residual stenosis.   Recurrent in-stent restenosis of the overlapped stent (BMS and DES) in  proximal RCA treated with aggressive angioplasty. Repeat FFR on ostial LAD confirms this is not likely significant.   Plan:  She'll be transferred to 6 [facility] post procedure unit for sheath removal and post PCI care.  Continue aggressive risk factor modification  Continue aspirin and Plavix lifelong  Smoking cessation counseling is mandatory to maintain stent  patency __________  LHC 01/2016:  Ost LAD to Prox LAD lesion, 40 %stenosed. Similar to prior cath.  The left ventricular systolic function is normal.  LV end diastolic pressure is normal.  The left ventricular ejection fraction is 55-65% by visual estimate.  There is no aortic valve stenosis.  Prox RCA lesion, 75 %stenosed, in-stent restenosis.  Post intervention with 3.0 x 10 Angiosculpt inflated to 3.5 mm, there is a 0% residual stenosis.   Continue aggressive secondary prevention.  Clopidogrel restarted. _________  2D echo 09/2015: - Left ventricle: The cavity size was normal. Systolic function was  normal. The estimated ejection fraction was in the range of 60%  to 65%. Wall motion was normal; there were no regional wall  motion abnormalities. Doppler parameters are consistent with  abnormal left ventricular relaxation (grade 1 diastolic  dysfunction).  - Right ventricle: Systolic function was normal.  - Pulmonary arteries: Systolic pressure was within the normal  range.   Impressions:   - Normal study.   Patient Profile     75 y.o. female with history of CAD with VF arrest s/p PCI to the RCA in 2017 s/p PTCA to the RCA for ISR in 2018, chronic combined systolic and diastolic CHF, COPD with ongoing tobacco use, HTN, HLD, anemia, and narcotic abuse on Suboxone therapy admitted with acute hypoxic respiratory failure requiring mechanical ventilation secondary to COPD exacerbation Klebsiella PNA and acute on chronic combined CHF with transient ST elevation with subsequent rise on HS-Tn to 2455 and  echo demonstrating new LV dysfunction requiring augmentation with milrinone for diuresis.   Assessment & Plan    1. Acute on chronic combined systolic and diastolic CHF: -Previously required milrinone for augmentation of IV diuresis, now off milrinone as of late in the day on 5/2 -Continue IV Lasix 40 mg daily with KCl repletion  -Continue Lopressor, prior to discharge, would look to consolidate to Toprl XL  -Start Entresto 24/26 mg bid  -Escalate GDMT throughout her admission as able -Daily weights -Strict I/O  2. CAD involving the native coronary arteries with NSTEMI: -Unable to assess symptoms -Elevated troponin may represent supply demand mismatch ischemia in the context of known CAD with her acute multifactorial illness -She has completed IV heparin -ASA and Plavix -Crestor  -Once her respiratory status has improved she will need a cardiac cath, likely next week  3. Acute respiratory failure with hypoxia with sepsis: -Multifactorial including PNA, COPD exacerbation, and heart failure -Gentle diuresis  -Further management per CCM  4. Afib: -Brief episode on 5/6 -In the context of her acute illness -If she has recurrent episodes, will need to start heparin gtt -Metoprolol as above  5. HLD: -LDL 43 in 09/2019 -PTA Crestor   6. Hypokalemia: -Replete to 4.0    For questions or updates, please contact [place] Please consult [email or web address] for contact info under Cardiology/STEMI.    Signed, Christell Faith, PA-C [place] Pager: [phone] 09/18/2020, 7:21 AM   Attending Note:   The patient was seen and examined.  Agree with assessment and plan as noted above.  Changes made to the above note as needed.  Patient seen and independently examined with  Christell Faith, PA .   We discussed all aspects of the encounter. I agree with the assessment and plan as stated above.  1.    CAD - NSTEMI :   Troponin peaked at 2455.  Will likely need a cath  Could be supply /  demand issue in the setting  of her other issues  2.  CHF :   Start entresto 24-26 BID   3.  Hyperlipidemia :   Cont rosuvastatin   4.     I have spent a total of 40 minutes with patient reviewing hospital  notes , telemetry, EKGs, labs and examining patient as well as establishing an assessment and plan that was discussed with the patient. > 50% of time was spent in direct patient care.    Thayer Headings, Brooke Bonito., MD, [facility] 09/18/2020, 11:40 AM [address],  [place] Pager [phone]

## 2020-09-18 NOTE — Progress Notes (Signed)
Inpatient Rehab Admissions Coordinator Note:   Per PT recommendations, pt was screened for CIR candidacy by Gayland Curry, MS, CCC-SLP.  At this time we are not recommending an inpatient rehab consult.  Will monitor pt's tolerance and progress with therapies from a distance. Please contact me with questions.    Gayland Curry, [place], [place] Admissions Coordinator [phone] 09/18/20 5:03 PM

## 2020-09-18 NOTE — Evaluation (Signed)
Physical Therapy Evaluation Patient Details Name: Kathryn Garrett MRN: [id number] DOB: 1945-08-24 Today's Date: 09/18/2020   History of Present Illness  75 y.o. female with history of CAD with VF arrest s/p PCI to the RCA in 2017 s/p PTCA to the RCA for ISR in 2018, chronic combined systolic and diastolic CHF, COPD with ongoing tobacco use, HTN, HLD, anemia, and narcotic abuse on Suboxone therapy admitted with acute hypoxic respiratory failure requiring mechanical ventilation secondary to COPD exacerbation Klebsiella PNA and acute on chronic combined CHF with transient ST elevation with subsequent rise on HS-Tn to 2455 and echo demonstrating new LV dysfunction requiring augmentation with milrinone for diuresis.  Clinical Impression  Pt seen for PT evaluation with daughters present. Pt verbally communicating throughout session but lethargic throughout. Pt requires max assist for supine<>sit with max assist for static sitting balance with little effort given by pt to hold self upright. Pt performs BLE heel slides with MAX multimodal cuing for technique but unable to follow commands to perform BLE ankle pumps so instructed daughters in heel cord stretches. Also spent time discussing therapy f/u with current recommendations of CIR as prior to admission pt was independent without AD, driving, working, & negotiating a flight of stairs daily to access her apartment on the 2nd floor. Will continue to follow pt acutely to progress activity as able.     Follow Up Recommendations CIR    Equipment Recommendations  None recommended by PT (TBD in next venue)    Recommendations for Other Services Rehab consult     Precautions / Restrictions Precautions Precautions: Fall Restrictions Weight Bearing Restrictions: No      Mobility  Bed Mobility Overal bed mobility: Needs Assistance Bed Mobility: Supine to Sit;Sit to Supine     Supine to sit: Max assist;HOB elevated Sit to supine: Max assist;HOB  elevated   General bed mobility comments: +2 total assist to scoot to [facility]    Transfers                    Ambulation/Gait                Stairs            Wheelchair Mobility    Modified Rankin (Stroke Patients Only)       Balance Overall balance assessment: Needs assistance Sitting-balance support: Feet unsupported Sitting balance-Leahy Scale: Zero Sitting balance - Comments: max assist static sitting balance                                     Pertinent Vitals/Pain Pain Assessment:  (c/o unrated indigestion)    Home Living Family/patient expects to be discharged to:: Private residence Living Arrangements: Alone Available Help at Discharge: Family;Available 24 hours/day Type of Home: Apartment Home Access: Stairs to enter Entrance Stairs-Rails: Right;Left (widset) Entrance Stairs-Number of Steps: flight Home Layout: One level Home Equipment: None      Prior Function Level of Independence: Independent         Comments: driving, working as Theme park manager, 1 fall in past 6 months (tripped over cords in bathroom), active     Hand Dominance        Extremity/Trunk Assessment   Upper Extremity Assessment Upper Extremity Assessment: Generalized weakness    Lower Extremity Assessment Lower Extremity Assessment: Generalized weakness       Communication      Cognition Arousal/Alertness: Lethargic Behavior During  Therapy: Flat affect Overall Cognitive Status: Impaired/Different from baseline Area of Impairment: Orientation;Attention;Memory;Safety/judgement;Awareness;Following commands;Problem solving                 Orientation Level: Disoriented to;Time   Memory: Decreased short-term memory Following Commands: Follows one step commands inconsistently;Follows one step commands with increased time Safety/Judgement: Decreased awareness of safety;Decreased awareness of deficits Awareness:  Intellectual;Emergent Problem Solving: Slow processing;Decreased initiation;Requires tactile cues;Requires verbal cues        General Comments      Exercises General Exercises - Lower Extremity Heel Slides: AAROM;Strengthening;Both;5 reps;Supine (multimodal cuing for proper technique) Other Exercises Other Exercises: PT & daughter perform BLE heel cord stretch with PT educating pt'sa daughter's on frequency of stretching regimen (30 seconds, 2-3x each LE), pt with difficulty following commands to perform BLE ankle pumps   Assessment/Plan    PT Assessment Patient needs continued PT services  PT Problem List Decreased safety awareness;Decreased strength;Decreased mobility;Decreased activity tolerance;Cardiopulmonary status limiting activity;Decreased balance;Decreased knowledge of use of DME;Decreased cognition       PT Treatment Interventions DME instruction;Therapeutic activities;Cognitive remediation;Modalities;Therapeutic exercise;Gait training;Patient/family education;Stair training;Balance training;Wheelchair mobility training;Functional mobility training;Neuromuscular re-education;Manual techniques    PT Goals (Current goals can be found in the Care Plan section)  Acute Rehab PT Goals Patient Stated Goal: get better PT Goal Formulation: With patient/family Time For Goal Achievement: 10/02/20 Potential to Achieve Goals: Fair    Frequency 7X/week   Barriers to discharge Inaccessible home environment;Decreased caregiver support flight of stairs to access apartment, lives alone    Co-evaluation               AM-PAC PT "6 Clicks" Mobility  Outcome Measure Help needed turning from your back to your side while in a flat bed without using bedrails?: Total Help needed moving from lying on your back to sitting on the side of a flat bed without using bedrails?: Total Help needed moving to and from a bed to a chair (including a wheelchair)?: Total Help needed standing up  from a chair using your arms (e.g., wheelchair or bedside chair)?: Total Help needed to walk in hospital room?: Total Help needed climbing 3-5 steps with a railing? : Total 6 Click Score: 6    End of Session Equipment Utilized During Treatment: Oxygen Activity Tolerance: Patient limited by lethargy Patient left: in bed;with call bell/phone within reach;with bed alarm set;with family/visitor present Nurse Communication: Mobility status PT Visit Diagnosis: Difficulty in walking, not elsewhere classified (R26.2);Muscle weakness (generalized) (M62.81)    Time: 1224-8250 PT Time Calculation (min) (ACUTE ONLY): 24 min   Charges:   PT Evaluation $PT Eval Low Complexity: 1 Low PT Treatments $Therapeutic Activity: 8-22 mins        [name], PT, DPT 09/18/20, 1:58 PM   [name] 09/18/2020, 1:56 PM

## 2020-09-18 NOTE — Progress Notes (Signed)
NAME:  Kathryn Garrett, MRN:  [id number], DOB:  06/21/1945, LOS: 8 ADMISSION DATE:  09/10/2020, CONSULTATION DATE: 09/11/2020 REFERRING MD: Dr. Damita Dunnings, CHIEF COMPLAINT: Shortness of breath  History of Present Illness:  75 year old female presented to the [facility] ED from home with complaints of shortness of breath.  Patient reported 2 to 3 days of progressive shortness of breath and associated new onset nonproductive cough as well as a subjective fever.  Per ED documentation she denied associated chest pain/palpitations/diaphoresis.  And denies any recent nausea/vomiting, chills/rigors/myalgias.  Patient confirmed a history of COPD and current smoking history stating she is not on any supplemental oxygen at baseline.  ED course: Patient received cefepime due to concerns for a left lower lobe infiltrate, 500 mL NS bolus & IV fluids at 150 mL an hour Initial vitals: Afebrile at 98, tachypneic at 22, tachycardic at 108, BP 127/72 & 99% on BiPAP Significant labs: Serum bicarb 15, hyperglycemic at 350, troponin 70 > 745 > 1838, BNP 222.6, VBG revealed metabolic acidosis: 0.93/26/712/45.8, leukocytosis at 40.2, lactic acidosis 2.5> 2.2 > 2.5  TRH hospitalist were consulted for admission.  Around midnight PCCM was called due to patient's deteriorating respiratory status.  Per ED provider Dr. Karma Greaser and care RN the patient complained that " I can't breathe", was given a dose of Ativan and became somnolent on BiPAP.  Coarse crackles auscultated bilaterally, and the decision was made to emergently intubate the patient and place her on mechanical ventilation. PCCM consulted for management  Significant Hospital Events: Including procedures, antibiotic start and stop dates in addition to other pertinent events   . 09/10/2020-patient admits admitted with hospitalist service for COPD exacerbation and left lower lobe pneumonia . 09/11/2020-overnight patient had suspected flash pulmonary edema, dyspnea followed by  somnolence requiring emergent intubation and mechanical ventilation.  Admitted to ICU . 09/12/20- patient failed SBT , she had blood tinged secretions fromETT and aggitation, family was at bedside and we agreed on giving her more time and try again.  . 09/14/20- Failed SBT (became hypoxic with sats mid 80's, increased WOB and assessory muscle use, HR 140-160's). Will diurese today and add Metoprolol.  Tracheal aspirate from 4/30 with KLEBSIELLA PNEUMONIAE (resistant to Ampicillin)   . 09/15/20- Failed SBT (increased WOB and assessory muscle use, RR 40's, HR 140-150's, Hypertensive); plan to add scheduled PO Klonopin and Oxycodone, Lasix 40 mg IV x1, consult Palliative Care . 09/16/20- Worsening Leukocytosis up to 21.7, low grade fever overnight, increased secretions overnight, will repeat Tracheal aspirate, remove central line, add mucinex, plan for SBT . 09/17/20-[name] passed SBT in AM and following commands still having some moderate secretions . 09/18/20-  Extubated yesterday.  Failed swallow eval today.  Interim History / Subjective:  Afebrile Worsening Leukocytosis up to 34.3 but remains on steroids Hemodynamically stable   Objective   Blood pressure (!) 149/84, pulse (!) 105, temperature 98.9 F (37.2 C), temperature source Oral, resp. rate (!) 22, height 4\' 11"  (1.499 m), weight 66.4 kg, SpO2 95 %.    FiO2 (%):  [30 %] 30 %   Intake/Output Summary (Last 24 hours) at 09/18/2020 1232 Last data filed at 09/18/2020 0998 Gross per 24 hour  Intake 1050.17 ml  Output 700 ml  Net 350.17 ml   Filed Weights   09/14/20 0500 09/15/20 0500 09/17/20 0445  Weight: 68.1 kg 68.3 kg 66.4 kg    Examination: General: Laying in bed awake and appears chronically ill but not in acute distress HEENT: Atraumatic, normocephalic, neck  supple, no JVD Neuro: Able to answer simple questions appropriately but mildly confused; generally very weak CV: Tachycardia, regular rhythm, S1-S2, no murmurs, rubs, gallops, 2+  distal pulses Pulm: Equal bilateral breath sounds without focal wheezing or rales GI: Soft, nondistended, nontender, no guarding or rebound tenderness, bowel sounds positive x4 Skin: Limited exam-warm and dry.  No obvious rashes, lesions, ulcerations Extremities: Warm and dry.  Minimal edema   Labs/imaging that I have personally reviewed  (right click and "Reselect all SmartList Selections" daily)  Labs 09/18/2020: WBC 34.3, hemoglobin 13.5, sodium 139, BUN 52, creatinine 0.51 Chest x-ray 09/16/2020>>Endotracheal tube, NG tube, right IJ line in stable position. Heart size normal. Persistent but improved bilateral interstitial infiltrates. Tiny left pleural effusion cannot be excluded. No pneumothorax. MRI brain 09/13/2020:No acute brain finding. Mild chronic small-vessel ischemic change of the white matter, fairly typical for age.  Assessment & Plan:  Acute hypoxic respiratory failure secondary to Acute COPD Exacerbation & left lung multifocal pneumonia -extubated yesterday and now doing well on low flow [place] -Completed 7 days of antibiotics  Sepsis due to left lung multifocal pneumonia>> tracheal aspirate on 4/30 with KLEBSIELLA PNEUMONIAE (resistant to Ampicillin) -Monitor fever curve -Trend WBCs.  Elevated WBC in part related to steroids. -Follow cultures as above -Completed 7 days of antibiotics  Acute systolic congestive heart failure Acute non-ST elevation MI New diagnosis of A. fib with RVR -Continuous cardiac monitoring -Patient's ejection fraction on echocardiogram is 30% -Diuresis as renal function and BP permits >> currently on 40 mg IV Lasix daily and will continue -Cardiology following, input is appreciated -Milrinone has been tapered off -Patient completed therapy with IV heparin -Continue aspirin Plavix and statin -She will need left heart cath once stable -Metoprolol BID ordered orally but did not pass swallow evaluation.  She is now on scheduled IV dosing.  Acute  metabolic/toxic encephalopathy Sedation needs in setting of Mechanical Ventilation -Improved now that she is extubated and off continuous sedation -stop scheduled Klonopin as unable to take p.o. -Was on buprenorphine/naloxone as an outpatient which is now held.  Not tolerating oral intake.  Will need to monitor closely for withdrawal.  He has morphine every 2 hours as needed ordered. -Daily wake up assessment -CT Head at admission negative -MRI Brain 09/13/20 negative -Continue Aspirin, Plavix, Atorvastatin -Provide supportive care  Best practice (right click and "Reselect all SmartList Selections" daily)  Diet:  NPO.  Repeat swallow evaluation to be performed on 09/20/2020 Pain/Anxiety/Delirium protocol (if indicated): N/A VAP protocol (if indicated): Now extubated. DVT prophylaxis: SCD (chemical prophylaxis held due to previous hemoptysis) GI prophylaxis: H2B Glucose control:  SSI Yes Central venous access: n/a Arterial line:  N/A Foley: N/A Mobility: Out of bed with assistance as tolerated PT consulted: needs Pt/OT Code Status:  full code Disposition: ICU   Labs   CBC: Recent Labs  Lab 09/13/20 0439 09/14/20 0523 09/15/20 0552 09/16/20 0433 09/16/20 0500 09/17/20 0406 09/18/20 0550  WBC 18.1* 10.9* 13.6* 21.7* 21.4* 28.8* 34.3*  NEUTROABS 13.8* 8.1*  --   --  14.1*  --   --   HGB 12.5 11.3* 10.7* 11.3* 11.3* 12.3 13.5  HCT 39.9 36.5 35.0* 36.7 37.0 40.4 43.2  MCV 96.8 96.8 98.3 97.1 97.9 98.1 97.1  PLT 277 215 218 250 256 280 176    Basic Metabolic Panel: Recent Labs  Lab 09/14/20 0523 09/15/20 0552 09/16/20 0433 09/17/20 0004 09/17/20 0406 09/18/20 0550  NA 142 143 142 138 139 139  K 4.0 4.5  4.2 4.4 4.3 3.3*  CL 112* 114* 108 99 100 101  CO2 21* 25 25 28 28 27   GLUCOSE 136* 120* 141* 122* 107* 92  BUN 66* 62* 60* 65* 64* 52*  CREATININE 0.72 0.62 0.54 0.70 0.66 0.51  CALCIUM 8.6* 8.3* 8.6* 8.5* 8.9 9.0  MG 2.4 2.4 2.2 2.1 2.2 2.3  PHOS 5.1* 4.1 3.8  --   4.5 3.5   GFR: Estimated Creatinine Clearance: 50.4 mL/min (by C-G formula based on SCr of 0.51 mg/dL). Recent Labs  Lab 09/11/20 1720 09/11/20 2011 09/12/20 0441 09/12/20 1159 09/12/20 1404 09/13/20 0439 09/14/20 0523 09/16/20 0433 09/16/20 0500 09/17/20 0406 09/18/20 0550  PROCALCITON  --   --   --  0.74  --  0.45  --   --   --   --   --   WBC  --   --    < >  --   --  18.1*   < > 21.7* 21.4* 28.8* 34.3*  LATICACIDVEN 2.5* 1.9  --  1.6 1.3  --   --   --   --   --   --    < > = values in this interval not displayed.    Liver Function Tests: Recent Labs  Lab 09/11/20 1720  AST 23   No results for input(s): LIPASE, AMYLASE in the last 168 hours. No results for input(s): AMMONIA in the last 168 hours.  ABG    Component Value Date/Time   PHART 7.38 09/15/2020 0500   PCO2ART 41 09/15/2020 0500   PO2ART 123 (H) 09/15/2020 0500   HCO3 33.5 (H) 09/17/2020 0944   TCO2 23 01/14/2009 0327   ACIDBASEDEF 0.8 09/15/2020 0500   O2SAT 88.8 09/17/2020 0944     Coagulation Profile: No results for input(s): INR, PROTIME in the last 168 hours.  Cardiac Enzymes: Recent Labs  Lab 09/11/20 1720  CKMB 26.6*    HbA1C: Hgb A1c MFr Bld  Date/Time Value Ref Range Status  09/11/2020 04:37 AM 6.4 (H) 4.8 - 5.6 % Final    Comment:    (NOTE) Pre diabetes:          5.7%-6.4%  Diabetes:              >6.4%  Glycemic control for   <7.0% adults with diabetes   10/02/2019 12:28 PM 6.4 (H) 4.8 - 5.6 % Final    Comment:             Prediabetes: 5.7 - 6.4          Diabetes: >6.4          Glycemic control for adults with diabetes: <7.0     CBG: Recent Labs  Lab 09/17/20 1936 09/17/20 2307 09/18/20 0339 09/18/20 0719 09/18/20 1127  GLUCAP 132* 97 137* 98 100*

## 2020-09-18 NOTE — Progress Notes (Signed)
Pharmacy Electrolyte Monitoring Consult:  Labs:  Sodium (mmol/L)  Date Value  09/18/2020 139  10/02/2019 141  03/23/2013 138   Potassium (mmol/L)  Date Value  09/18/2020 3.3 (L)  03/23/2013 4.2   Magnesium (mg/dL)  Date Value  09/18/2020 2.3  03/20/2013 2.0   Phosphorus (mg/dL)  Date Value  09/18/2020 3.5   Calcium (mg/dL)  Date Value  09/18/2020 9.0   Calcium, Total (mg/dL)  Date Value  03/23/2013 9.0   Albumin (g/dL)  Date Value  09/11/2020 3.4 (L)  10/02/2019 4.2  03/19/2013 3.3 (L)    Assessment: Patient is a 75 y/o F with medical history including CAD s/p stenting, CHF, COPD who is admitted with COPD exacerbation / pneumonia / decompensated heart failure / NSTEMI. Pharmacy has been consulted to assist with electrolyte monitoring and replacement as indicated.   Vital High Protein @50mL /hr Free water 42mL q4h  Goals of therapy: K 4-5.1 mmol/L Mg 2-2.4 mg/dL All other electrolytes within normal limits  Plan K 3.3  Mag 2.3  Phos 3.5  Scr 0.51   On Lasix 40 IV daily, Entresto -NP has ordered KCL 10 meq IV x 6 doses --Follow-up electrolytes with AM labs tomorrow  Noralee Space, PharmD 09/18/2020 10:40 AM

## 2020-09-19 ENCOUNTER — Inpatient Hospital Stay: Payer: Medicare HMO

## 2020-09-19 DIAGNOSIS — J441 Chronic obstructive pulmonary disease with (acute) exacerbation: Secondary | ICD-10-CM | POA: Diagnosis not present

## 2020-09-19 LAB — CULTURE, RESPIRATORY W GRAM STAIN

## 2020-09-19 LAB — GLUCOSE, CAPILLARY
Glucose-Capillary: 100 mg/dL — ABNORMAL HIGH (ref 70–99)
Glucose-Capillary: 117 mg/dL — ABNORMAL HIGH (ref 70–99)
Glucose-Capillary: 124 mg/dL — ABNORMAL HIGH (ref 70–99)
Glucose-Capillary: 127 mg/dL — ABNORMAL HIGH (ref 70–99)
Glucose-Capillary: 92 mg/dL (ref 70–99)
Glucose-Capillary: 96 mg/dL (ref 70–99)

## 2020-09-19 LAB — BASIC METABOLIC PANEL
Anion gap: 15 (ref 5–15)
BUN: 67 mg/dL — ABNORMAL HIGH (ref 8–23)
CO2: 22 mmol/L (ref 22–32)
Calcium: 9.3 mg/dL (ref 8.9–10.3)
Chloride: 106 mmol/L (ref 98–111)
Creatinine, Ser: 0.66 mg/dL (ref 0.44–1.00)
GFR, Estimated: >60 mL/min (ref >60)
Glucose, Bld: 89 mg/dL (ref 70–99)
Potassium: 5.3 mmol/L — ABNORMAL HIGH (ref 3.5–5.1)
Sodium: 143 mmol/L (ref 135–145)

## 2020-09-19 LAB — CBC
HCT: 46.5 % — ABNORMAL HIGH (ref 36.0–46.0)
Hemoglobin: 14.3 g/dL (ref 12.0–15.0)
MCH: 29.9 pg (ref 26.0–34.0)
MCHC: 30.8 g/dL (ref 30.0–36.0)
MCV: 97.3 fL (ref 80.0–100.0)
Platelets: 361 10*3/uL (ref 150–400)
RBC: 4.78 MIL/uL (ref 3.87–5.11)
RDW: 15.5 % (ref 11.5–15.5)
WBC: 28.5 10*3/uL — ABNORMAL HIGH (ref 4.0–10.5)
nRBC: 0 % (ref 0.0–0.2)

## 2020-09-19 LAB — PHOSPHORUS: Phosphorus: 4.4 mg/dL (ref 2.5–4.6)

## 2020-09-19 LAB — MAGNESIUM: Magnesium: 2.5 mg/dL — ABNORMAL HIGH (ref 1.7–2.4)

## 2020-09-19 IMAGING — DX DG ABDOMEN 1V
1 series · 1 of 1 positions shown · non-contrast
Comparison: One view chest [DATE]

CLINICAL DATA: Nasogastric tube placement.

EXAM:
ABDOMEN - 1 VIEW

[abdomen supine]
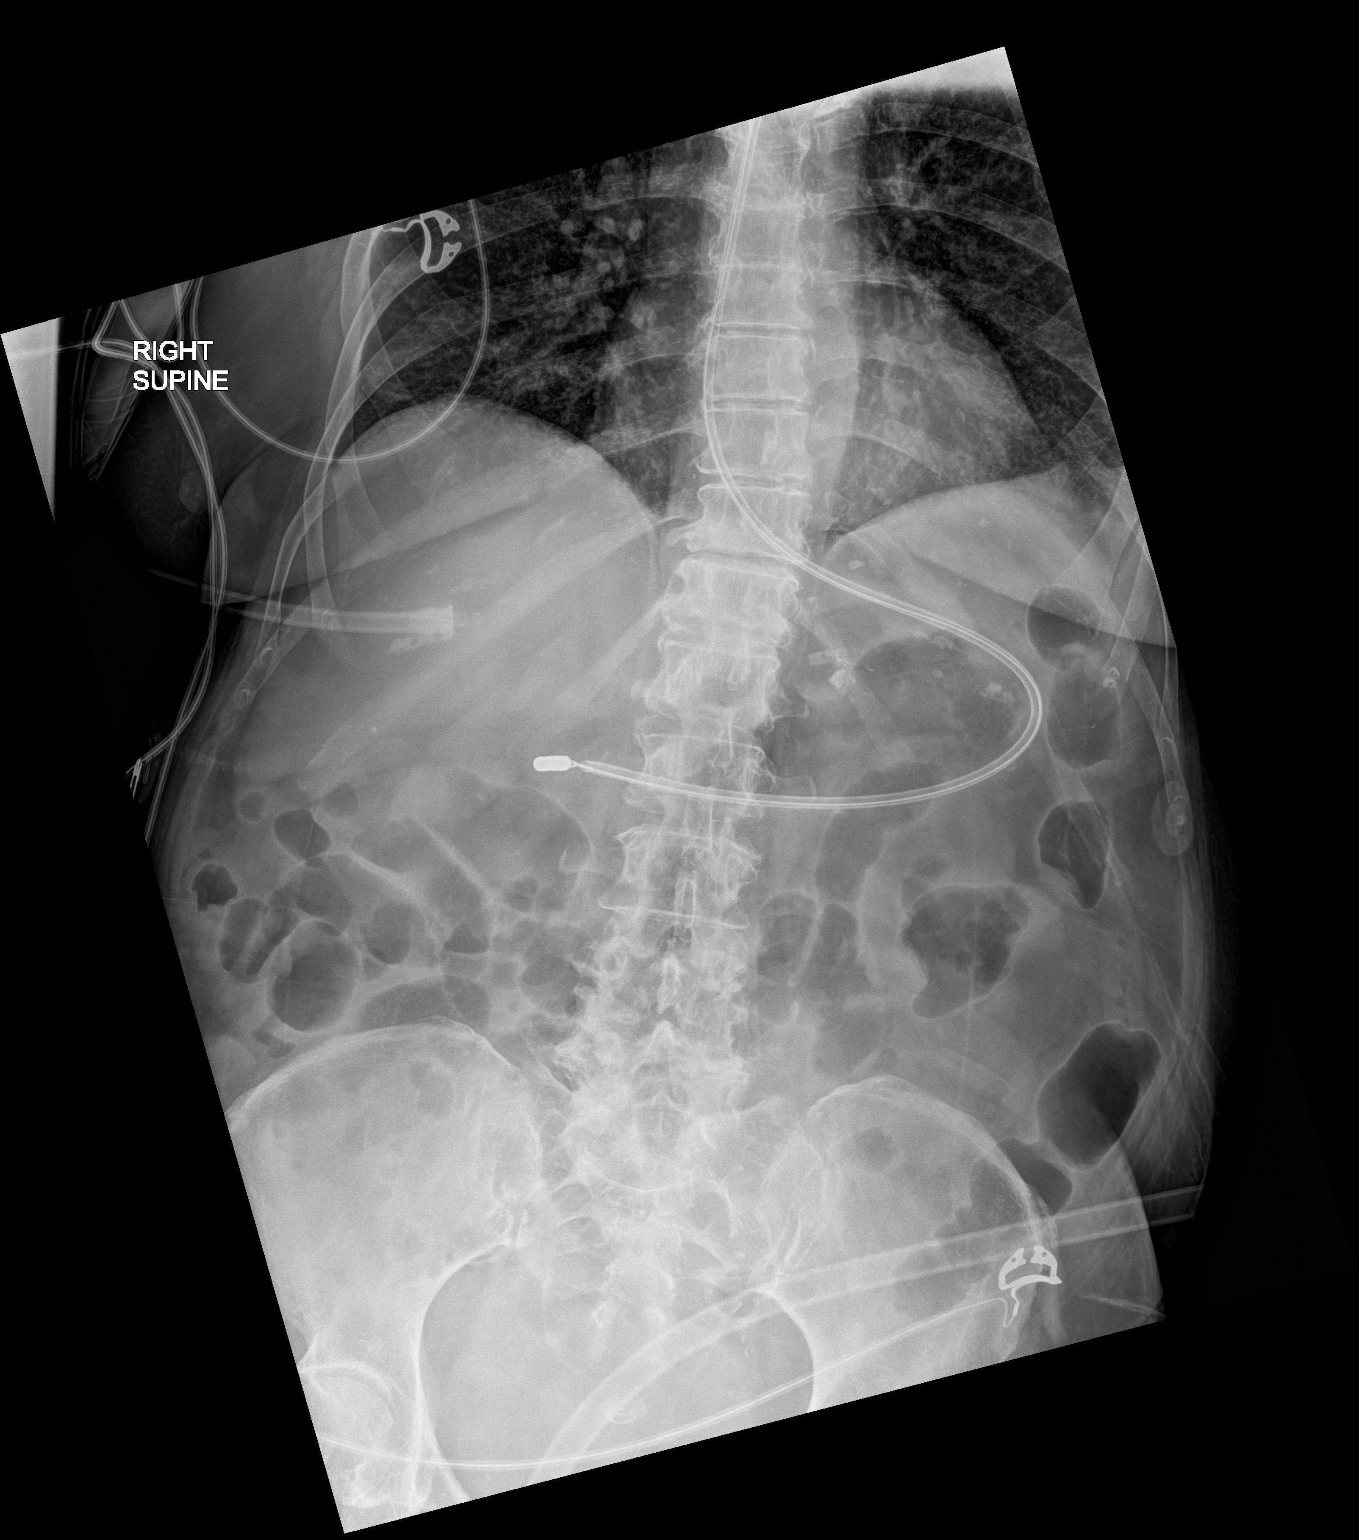

[1 of 1 positions shown; findings below may reference images not displayed]

FINDINGS: A feeding tube projects over the right upper quadrant of the abdomen
consistent with tip in the distal stomach or proximal duodenum. The
previously demonstrated nasogastric tube has been removed. The
visualized bowel gas pattern is normal. Mild lumbar spine
degenerative changes are present.
IMPRESSION: Feeding tube projects to the right upper quadrant of the abdomen
consistent with position in the distal stomach or proximal duodenum.

## 2020-09-19 MED ORDER — OXYCODONE HCL 5 MG PO TABS
7.5000 mg | ORAL_TABLET | Freq: Four times a day (QID) | ORAL | Status: DC
  Administered 2020-09-20 (×3): 7.5 mg
  Filled 2020-09-19 (×3): qty 2

## 2020-09-19 MED ORDER — ACETAMINOPHEN 650 MG RE SUPP: 650.0000 mg | Freq: Four times a day (QID) | RECTAL | Status: DC | PRN

## 2020-09-19 MED ORDER — VITAL HIGH PROTEIN PO LIQD
1000.0000 mL | ORAL | Status: DC
  Administered 2020-09-19: 1000 mL

## 2020-09-19 MED ORDER — METOPROLOL TARTRATE 25 MG PO TABS: 37.5000 mg | ORAL_TABLET | Freq: Two times a day (BID) | ORAL | Status: DC

## 2020-09-19 MED ORDER — ACETAMINOPHEN 325 MG PO TABS: 650.0000 mg | ORAL_TABLET | Freq: Four times a day (QID) | ORAL | Status: DC | PRN

## 2020-09-19 MED ORDER — METOPROLOL TARTRATE 25 MG PO TABS
25.0000 mg | ORAL_TABLET | Freq: Two times a day (BID) | ORAL | Status: DC
  Administered 2020-09-19 – 2020-09-20 (×2): 25 mg
  Filled 2020-09-19 (×2): qty 1

## 2020-09-19 MED ORDER — DOCUSATE SODIUM 50 MG/5ML PO LIQD: 100.0000 mg | Freq: Two times a day (BID) | ORAL | Status: DC | PRN

## 2020-09-19 MED ORDER — ASPIRIN 81 MG PO CHEW
81.0000 mg | CHEWABLE_TABLET | Freq: Every day | ORAL | Status: DC
  Administered 2020-09-19: 81 mg
  Filled 2020-09-19: qty 1

## 2020-09-19 MED ORDER — GUAIFENESIN 100 MG/5ML PO SOLN
10.0000 mL | ORAL | Status: DC | PRN
  Administered 2020-09-20: 200 mg
  Filled 2020-09-19 (×2): qty 10

## 2020-09-19 MED ORDER — CLOPIDOGREL BISULFATE 75 MG PO TABS
75.0000 mg | ORAL_TABLET | Freq: Every day | ORAL | Status: DC
  Administered 2020-09-19: 75 mg
  Filled 2020-09-19: qty 1

## 2020-09-19 MED ORDER — PANTOPRAZOLE SODIUM 40 MG PO PACK
40.0000 mg | PACK | Freq: Every day | ORAL | Status: DC
  Administered 2020-09-20: 40 mg
  Filled 2020-09-19: qty 20

## 2020-09-19 MED ORDER — METOPROLOL TARTRATE 25 MG PO TABS
25.0000 mg | ORAL_TABLET | Freq: Two times a day (BID) | ORAL | Status: DC
  Administered 2020-09-19: 25 mg via ORAL
  Filled 2020-09-19: qty 1

## 2020-09-19 MED ORDER — SACUBITRIL-VALSARTAN 24-26 MG PO TABS
1.0000 | ORAL_TABLET | Freq: Two times a day (BID) | ORAL | Status: DC
  Administered 2020-09-19 – 2020-09-20 (×2): 1
  Filled 2020-09-19 (×3): qty 1

## 2020-09-19 MED ORDER — LEVOTHYROXINE SODIUM 88 MCG PO TABS
88.0000 ug | ORAL_TABLET | Freq: Every day | ORAL | Status: DC
  Administered 2020-09-20: 88 ug
  Filled 2020-09-19: qty 1

## 2020-09-19 NOTE — Evaluation (Signed)
Occupational Therapy Evaluation Patient Details Name: Kathryn Garrett MRN: [id number] DOB: Feb 08, 1946 Today's Date: 09/19/2020    History of Present Illness 75 y.o. female with history of CAD with VF arrest s/p PCI to the RCA in 2017 s/p PTCA to the RCA for ISR in 2018, chronic combined systolic and diastolic CHF, COPD with ongoing tobacco use, HTN, HLD, anemia, and narcotic abuse on Suboxone therapy admitted with acute hypoxic respiratory failure requiring mechanical ventilation (extubated 09/17/2020) secondary to COPD exacerbation Klebsiella PNA and acute on chronic combined CHF with transient ST elevation and echo demonstrating new LV dysfunction.   Clinical Impression   Pt seen for OT evlaution this date in setting of prolonged hospitalization d/t respiratory failure requiring ventilation. Pt is on RA at time of OT assessment with sats sustaining >90% throughout. Pt's daughters are present in room throughout eval/tx and report that pt was INDEP with all I/ADLs at baseline including working and driving and state that pt was walking with no AD. Pt presents this date with decreased cognition, decreased attn to task, gross weakness of trunk and limbs (R UE slightly weaker than L UE on assessment) and general deconditioning over course of hospital stay, impacting her ability to safely and efficiently perform even basic self care or ADL mobility. Pt requires MAX A for bed mobility with HOB elevated to come to sitting and requires MOD A to sustain static sitting balance.  Pt tolerates ~7 mins EOB sitting with POOR static sitting balance and OT encouraging scapular retraction and cervical extension to improve posture and breathing. Pt only able to participate in UB ADLs at bed level as her UEs are too weak to elevate against gravity and she requires UEs for seated support in EOB sitting position. Pt requires MAX A for UB ADLs bed level with hand over hand for oral care and donning clean gown. OT engages pt in  straight arm rasises x5 each side with moderate cues and engages pt in DF of the feet x10 per side with daughters educated on rationale. Pt left in bed with all needs met and in reach. Will continue to follow acutely. Anticipate pt will require extensive rehabilitation upon d/c from acute setting. Recommending f/u in inpatient acute rehabilitation at this time.     Follow Up Recommendations  CIR    Equipment Recommendations  3 in 1 bedside commode;Tub/shower seat;Other (comment) (2ww, w/c)    Recommendations for Other Services       Precautions / Restrictions Precautions Precautions: Fall Restrictions Weight Bearing Restrictions: No Other Position/Activity Restrictions: NG tube      Mobility Bed Mobility Overal bed mobility: Needs Assistance Bed Mobility: Supine to Sit;Sit to Supine     Supine to sit: Max assist;HOB elevated Sit to supine: Max assist;HOB elevated   General bed mobility comments: increased time, cues, MAX A +2 for propulsion towards HOB for repositioning.    Transfers                 General transfer comment: deferred, unsafe    Balance Overall balance assessment: Needs assistance Sitting-balance support: Feet unsupported Sitting balance-Leahy Scale: Poor Sitting balance - Comments: pt makes some minimal effort to sustain static sitting on 1/4 trials for a ~10 second bout with MAX cues, but otherwise requires MOD A and B UE support to attempt to sustain static sitting balance.       Standing balance comment: deferred  ADL either performed or assessed with clinical judgement   ADL Overall ADL's : Needs assistance/impaired                                       General ADL Comments: MAX A hand over hand for UB ADLs bed level, unable to raise UEs against gravity enough in sitting to attempt an UB ADLs and requires UEs for seated support. TOTAL A for LB ADLs bed level.     Vision Patient  Visual Report: No change from baseline Additional Comments: difficult to fomrally assess d/t cognition     Perception     Praxis      Pertinent Vitals/Pain Pain Assessment: Faces Faces Pain Scale: No hurt     Hand Dominance     Extremity/Trunk Assessment Upper Extremity Assessment Upper Extremity Assessment: Generalized weakness (on assessment, R weaker than L. Barely able to I'ly raise R UE against gravity, grip MMT grossly 3-/5. L able to minimally rasie agaist gravity ~2-3 inches from bed, grip grossly 3+/5)   Lower Extremity Assessment Lower Extremity Assessment: Generalized weakness   Cervical / Trunk Assessment Cervical / Trunk Assessment:  (head down in sitting likely d/t weakness/bed bound x9 days versus actual kyphosis.)   Communication     Cognition Arousal/Alertness: Lethargic;Awake/alert (able to attend moderately when awake, but drowsy and requires cues to wake/rouse throughout) Behavior During Therapy: Flat affect Overall Cognitive Status: Impaired/Different from baseline Area of Impairment: Orientation;Attention;Memory;Safety/judgement;Awareness;Following commands;Problem solving                 Orientation Level: Disoriented to;Time;Situation   Memory: Decreased short-term memory Following Commands: Follows one step commands inconsistently;Follows one step commands with increased time Safety/Judgement: Decreased awareness of safety;Decreased awareness of deficits Awareness: Intellectual;Emergent Problem Solving: Slow processing;Decreased initiation;Requires tactile cues;Requires verbal cues     General Comments       Exercises Other Exercises Other Exercises: OT educates pt family members and pt on importance of DF while bed level to maintain strength of anterior lower LE to prevent need for orthosis for standing/walking. Pt with poor reception of education, but her daughters that are present demo good understanding of rationale and technique on  teach-back. Other Exercises: OT ed re: role of OT in acute setting.   Shoulder Instructions      Home Living Family/patient expects to be discharged to:: Private residence Living Arrangements: Alone Available Help at Discharge: Family;Available 24 hours/day Type of Home: Apartment Home Access: Stairs to enter [employer] of Steps: flight Entrance Stairs-Rails: Right;Left (wide) Home Layout: One level               Home Equipment: None          Prior Functioning/Environment Level of Independence: Independent        Comments: driving, working as Theme park manager, 1 fall in past 6 months (tripped over cords in bathroom), active        OT Problem List: Decreased strength;Decreased range of motion;Decreased activity tolerance;Impaired balance (sitting and/or standing);Decreased coordination;Decreased cognition;Decreased safety awareness;Decreased knowledge of use of DME or AE;Cardiopulmonary status limiting activity;Increased edema      OT Treatment/Interventions: Self-care/ADL training;DME and/or AE instruction;Therapeutic activities;Balance training;Therapeutic exercise;Neuromuscular education;Energy conservation;Patient/family education    OT Goals(Current goals can be found in the care plan section) Acute Rehab OT Goals Patient Stated Goal: get better OT Goal Formulation: With patient/family Time For Goal Achievement: 10/03/20 Potential to  Achieve Goals: Good ADL Goals Pt Will Perform Grooming: with supervision;sitting;with min assist (seated unsupported with F static sitting balance to complete 1-2 g/h tasks to increase fxl seated activity tolernace) Pt Will Perform Upper Body Bathing: with min assist;sitting (with F static sitting balance with modified technique as needed d/t UE weakness.) Pt Will Perform Upper Body Dressing: with min assist;sitting (with F static sitting balance with modified technique as needed d/t UE weakness.) Pt Will Transfer to Toilet:  with mod assist;with max assist;with +2 assist;squat pivot transfer;bedside commode Pt Will Perform Toileting - Clothing Manipulation and hygiene: with max assist;with 2+ total assist;sitting/lateral leans Pt/caregiver will Perform Home Exercise Program: Increased strength;Both right and left upper extremity;With minimal assist Additional ADL Goal #1: Pt will tolerate advancing mobility to STS attempt with 2p MAX A in order to further allow for development of OT POC.  OT Frequency: Min 1X/week   Barriers to D/C:            Co-evaluation              AM-PAC OT "6 Clicks" Daily Activity     Outcome Measure Help from another person eating meals?: A Lot Help from another person taking care of personal grooming?: A Lot Help from another person toileting, which includes using toliet, bedpan, or urinal?: Total Help from another person bathing (including washing, rinsing, drying)?: Total Help from another person to put on and taking off regular upper body clothing?: A Lot Help from another person to put on and taking off regular lower body clothing?: Total 6 Click Score: 9   End of Session Nurse Communication: Mobility status  Activity Tolerance: Patient tolerated treatment well Patient left: in bed;with call bell/phone within reach;with bed alarm set;with family/visitor present  OT Visit Diagnosis: Unsteadiness on feet (R26.81);Muscle weakness (generalized) (M62.81);Other symptoms and signs involving cognitive function                Time: 5366-4403 OT Time Calculation (min): 31 min Charges:  OT General Charges $OT Visit: 1 Visit OT Evaluation $OT Eval Moderate Complexity: 1 Mod OT Treatments $Self Care/Home Management : 8-22 mins $Therapeutic Activity: 8-22 mins  Gerrianne Scale, MS, OTR/L ascom [phone] 09/19/20, 5:22 PM

## 2020-09-19 NOTE — Progress Notes (Addendum)
Progress Note  Patient Name: Kathryn Garrett Date of Encounter: 09/19/2020  Primary Cardiologist: Irish Lack  Subjective   Extubated 5/6, now weaned to room air. No chest pain, dyspnea, or palpitations. Documented UOP 300 mL for the past 24 hours with net + 2.2 L for the admission. Renal function stable. Failed swallow test 5/7, so has not yet received Entresto.   Inpatient Medications    Scheduled Meds: . aspirin  81 mg Oral QHS  . budesonide (PULMICORT) nebulizer solution  0.25 mg Nebulization BID  . Chlorhexidine Gluconate Cloth  6 each Topical QHS  . clopidogrel  75 mg Oral QHS  . fluticasone  1 spray Each Nare Daily  . furosemide  40 mg Intravenous Daily  . guaiFENesin  10 mL Oral Q4H  . insulin aspart  0-15 Units Subcutaneous Q4H  . ipratropium-albuterol  3 mL Nebulization Q6H  . levothyroxine  88 mcg Oral Q0600  . mouth rinse  15 mL Mouth Rinse BID  . metoprolol tartrate  5 mg Intravenous Q6H  . metoprolol tartrate  25 mg Oral BID  . multivitamin with minerals  1 tablet Oral Daily  . oxyCODONE  7.5 mg Oral Q6H  . pantoprazole sodium  40 mg Oral Daily  . predniSONE  20 mg Oral Q breakfast  . rosuvastatin  20 mg Oral Daily  . sacubitril-valsartan  1 tablet Oral BID   Continuous Infusions: . sodium chloride 10 mL/hr at 09/17/20 0700   PRN Meds: acetaminophen **OR** acetaminophen, albuterol, artificial tears, docusate, metoprolol tartrate, morphine injection, polyethylene glycol   Vital Signs    Vitals:   09/19/20 0600 09/19/20 0700 09/19/20 0738 09/19/20 0800  BP: (!) 158/70 (!) 159/72  (!) 144/83  Pulse: (!) 105 95  (!) 106  Resp: 19 16  (!) 22  Temp:    97.6 F (36.4 C)  TempSrc:      SpO2: 97% 98% 98% 99%  Weight:      Height:        Intake/Output Summary (Last 24 hours) at 09/19/2020 0925 Last data filed at 09/19/2020 0700 Gross per 24 hour  Intake 400 ml  Output 900 ml  Net -500 ml   Filed Weights   09/15/20 0500 09/17/20 0445 09/19/20 0500   Weight: 68.3 kg 66.4 kg 67 kg    Telemetry    SR with sinus tachycardia, PACs and PVCs, brief episode of atrial tachycardia - Personally Reviewed  ECG    No new tracings - Personally Reviewed  Physical Exam   GEN: No acute distress. Ill appearing.   Neck: JVD unable to be assessed secondary to dressing. Cardiac: RRR, no murmurs, rubs, or gallops.  Respiratory: Diminished breath sounds bilaterally.  GI: Soft, nontender, non-distended.   MS: No edema; No deformity. Neuro:  Intubated and sedated.  Psych: Intubated and sedated, will shake head yes or no.  Labs    Chemistry Recent Labs  Lab 09/17/20 0406 09/18/20 0550 09/19/20 0418  NA 139 139 143  K 4.3 3.3* 5.3*  CL 100 101 106  CO2 28 27 22   GLUCOSE 107* 92 89  BUN 64* 52* 67*  CREATININE 0.66 0.51 0.66  CALCIUM 8.9 9.0 9.3  GFRNONAA >60 >60 >60  ANIONGAP 11 11 15      Hematology Recent Labs  Lab 09/17/20 0406 09/18/20 0550 09/19/20 0418  WBC 28.8* 34.3* 28.5*  RBC 4.12 4.45 4.78  HGB 12.3 13.5 14.3  HCT 40.4 43.2 46.5*  MCV 98.1 97.1 97.3  MCH 29.9 30.3 29.9  MCHC 30.4 31.3 30.8  RDW 15.5 15.7* 15.5  PLT 280 358 361    Cardiac EnzymesNo results for input(s): TROPONINI in the last 168 hours. No results for input(s): TROPIPOC in the last 168 hours.   BNP No results for input(s): BNP, PROBNP in the last 168 hours.   DDimer  No results for input(s): DDIMER in the last 168 hours.   Radiology    DG Chest Port 1 View  Result Date: 09/16/2020 IMPRESSION: 1.  Lines and tubes in stable position. 2.  Persistent but improved bilateral interstitial infiltrates. Electronically Signed   By: Dubois   On: 09/16/2020 07:44   DG Chest Port 1 View  Result Date: 09/15/2020 IMPRESSION: Lines and tubes as above. Bilateral heterogeneous opacities in the lungs with fairly significant interval clearing of the left lung periphery. Trace residual left effusion. Aortic Atherosclerosis (ICD10-I70.0).  Electronically Signed   By: Lovena Le M.D.   On: 09/15/2020 03:48    Cardiac Studies   2D echo 09/11/2020: 1. Recommend limited echo with iv contrast agent (definity) for addequate  assessment of LVEF and wall motion.. Left ventricular ejection fraction,  by estimation, is 25 to 30%. The left ventricle has severely decreased  function. Left ventricular  endocardial border not optimally defined to evaluate regional wall motion.  Left ventricular diastolic parameters are indeterminate.  2. Right ventricular systolic function is normal. The right ventricular  size is normal.  3. The mitral valve was not well visualized. Mild mitral valve  regurgitation.  4. The aortic valve was not well visualized. Aortic valve regurgitation  is not visualized.  __________  LHC 05/2016:  Ost RCA lesion, 30 %stenosed.  LM lesion, 30 %stenosed.  Ost LAD lesion, 50 %stenosed - looks similar to prior catheterization. FFR 0.87  The left ventricular systolic function is normal.  LV end diastolic pressure is normal.  The left ventricular ejection fraction is 55-65% by visual estimate.  There is mild (2+) mitral regurgitation.  _______CULPRIT LESION_____________  Prox RCA Overlapped BMS-DES stents, 99 % -n-stent re-stenosed.  PTCA with 3.0 mm [place] Balloon with Post-dilation using 3.5 mm [place] Balloon was perfromed. Post intervention, there is a 0% residual stenosis.   Recurrent in-stent restenosis of the overlapped stent (BMS and DES) in proximal RCA treated with aggressive angioplasty. Repeat FFR on ostial LAD confirms this is not likely significant.   Plan:  She'll be transferred to 6 Central post procedure unit for sheath removal and post PCI care.  Continue aggressive risk factor modification  Continue aspirin and Plavix lifelong  Smoking cessation counseling is mandatory to maintain stent patency __________  LHC 01/2016:  Ost LAD to Prox LAD lesion, 40 %stenosed. Similar to  prior cath.  The left ventricular systolic function is normal.  LV end diastolic pressure is normal.  The left ventricular ejection fraction is 55-65% by visual estimate.  There is no aortic valve stenosis.  Prox RCA lesion, 75 %stenosed, in-stent restenosis.  Post intervention with 3.0 x 10 Angiosculpt inflated to 3.5 mm, there is a 0% residual stenosis.   Continue aggressive secondary prevention.  Clopidogrel restarted. _________  2D echo 09/2015: - Left ventricle: The cavity size was normal. Systolic function was  normal. The estimated ejection fraction was in the range of 60%  to 65%. Wall motion was normal; there were no regional wall  motion abnormalities. Doppler parameters are consistent with  abnormal left ventricular relaxation (grade 1 diastolic  dysfunction).  -  Right ventricle: Systolic function was normal.  - Pulmonary arteries: Systolic pressure was within the normal  range.   Impressions:   - Normal study.   Patient Profile     75 y.o. female with history of CAD with VF arrest s/p PCI to the RCA in 2017 s/p PTCA to the RCA for ISR in 2018, chronic combined systolic and diastolic CHF, COPD with ongoing tobacco use, HTN, HLD, anemia, and narcotic abuse on Suboxone therapy admitted with acute hypoxic respiratory failure requiring mechanical ventilation secondary to COPD exacerbation Klebsiella PNA and acute on chronic combined CHF with transient ST elevation with subsequent rise on HS-Tn to 2455 and echo demonstrating new LV dysfunction requiring augmentation with milrinone for diuresis.   Assessment & Plan    1. Acute on chronic combined systolic and diastolic CHF: -Previously required milrinone for augmentation of IV diuresis, now off milrinone as of late in the day on 5/2 -Continue IV Lasix 40 mg daily with KCl repletion as indicated -Has Lopressor 25 mg bid ordered, but has been receiving IV metoprolol as she failed swallow test on 5/7 -Prior to  discharge, would look to consolidate to Toprl XL  -Started Entresto 24/26 mg bid on 5/7, though has not yet received this as she failed swallow test on 5/7, start when able -Escalate GDMT throughout her admission as able -Daily weights -Strict I/O  2. CAD involving the native coronary arteries with NSTEMI: -Unable to assess symptoms -Elevated troponin may represent supply demand mismatch ischemia in the context of known CAD with her acute multifactorial illness -She has completed IV heparin -ASA and Plavix -Crestor  -Once her respiratory status has improved she will need a cardiac cath this upcoming week with timing to be determined   3. Acute respiratory failure with hypoxia with sepsis: -Multifactorial including PNA, COPD exacerbation, and heart failure -Extubated 5/6 -Diuresis as above -Further management per CCM  4. Afib: -Brief episode on 5/6, none since -In the context of her acute illness -If she has recurrent episodes, will need to start heparin gtt -Metoprolol as above  5. HLD: -LDL 43 in 09/2019 -PTA Crestor   6. Hypokalemia: -Repleted with a current value of 5.3 with hemolysis noted -BMP    For questions or updates, please contact [place] Please consult [email or web address] for contact info under Cardiology/STEMI.    Signed, Christell Faith, PA-C [place] Pager: [phone] 09/19/2020, 9:25 AM    Attending Note:   The patient was seen and examined.  Agree with assessment and plan as noted above.  Changes made to the above note as needed.  Patient seen and independently examined with  Christell Faith, PA .   We discussed all aspects of the encounter. I agree with the assessment and plan as stated above.  1.   Acute on chronic combined CHF:   Cont current meds. She has not been getting her PO metoprool due to failing her swallowing study  Cont metoprolol 25 PO BID once she has a feeding tube.   2.   CAD :  S/p NSTEMI .  Cont ASA , plavix  Anticipate cath  once she is better.   3.  Respiratory failure:   Multifactorial , COPD, acute / chronic combined CHF  Cont supportive care .    I have spent a total of 40 minutes with patient reviewing hospital  notes , telemetry, EKGs, labs and examining patient as well as establishing an assessment and plan that was discussed with the patient. >  50% of time was spent in direct patient care.    Thayer Headings, Brooke Bonito., MD, [facility] 09/19/2020, 10:09 AM [address]  [place] Pager [phone]

## 2020-09-19 NOTE — Progress Notes (Signed)
PROGRESS NOTE    Kathryn Garrett  [id number] DOB: 1945/12/02 DOA: 09/10/2020 PCP: Imagene Riches, NP  Brief Narrative:  75 year old female presented to the [facility] ED from home with complaints of shortness of breath.  Patient reported 2 to 3 days of progressive shortness of breath and associated new onset nonproductive cough as well as a subjective fever.  Per ED documentation she denied associated chest pain/palpitations/diaphoresis.  And denies any recent nausea/vomiting, chills/rigors/myalgias.  Patient confirmed a history of COPD and current smoking history stating she is not on any supplemental oxygen at baseline.  [facility] hospitalist were consulted for admission.  Around midnight PCCM was called due to patient's deteriorating respiratory status.  Per ED provider Dr. Karma Greaser and care RN the patient complained that " I can't breathe", was given a dose of Ativan and became somnolent on BiPAP.  Coarse crackles auscultated bilaterally, and the decision was made to emergently intubate the patient and place her on mechanical ventilation. PCCM consulted for management   09/10/2020-patient admits admitted with hospitalist service for COPD exacerbation and left lower lobe pneumonia  09/11/2020-overnight patient had suspected flash pulmonary edema, dyspnea followed by somnolence requiring emergent intubation and mechanical ventilation.  Admitted to ICU  09/12/20- patient failed SBT , she had blood tinged secretions fromETT and aggitation, family was at bedside and we agreed on giving her more time and try again.   09/14/20- Failed SBT (became hypoxic with sats mid 80's, increased WOB and assessory muscle use, HR 140-160's). Will diurese today and add Metoprolol.  Tracheal aspirate from 4/30 with KLEBSIELLA PNEUMONIAE (resistant to Ampicillin)    09/15/20- Failed SBT (increased WOB and assessory muscle use, RR 40's, HR 140-150's, Hypertensive); plan to add scheduled PO Klonopin and Oxycodone, Lasix 40 mg IV  x1, consult Palliative Care  09/16/20- Worsening Leukocytosis up to 21.7, low grade fever overnight, increased secretions overnight, will repeat Tracheal aspirate, remove central line, add mucinex, plan for SBT  09/17/20-[name] passed SBT in AM and following commands still having some moderate secretions  09/18/20-  Extubated yesterday.  Failed swallow eval today.  5/8: Patient transferred to [facility] service after successful extubation.  Leukocytosis improving.  Remains markedly encephalopathic.  Unable to provide history.    Assessment & Plan:   Principal Problem:   Acute exacerbation of chronic obstructive pulmonary disease (COPD) (HCC) Active Problems:   CAP (community acquired pneumonia)   HTN (hypertension)   Tobacco abuse   Hyperlipidemia LDL goal <70   Severe sepsis (HCC)   NSTEMI (non-ST elevated myocardial infarction) (HCC)   Hyperglycemia   SOB (shortness of breath)   Acute respiratory failure (HCC)   HFrEF (heart failure with reduced ejection fraction) (HCC)  Acute hypoxic respiratory failure secondary to Acute COPD Exacerbation & left lung multifocal pneumonia Extubated 5/7 Saturating well on 2 L Completed course of antibiotics Continue to provide supportive care  Sepsis due to left lung multifocal pneumonia>> tracheal aspirate on 4/30 with KLEBSIELLA PNEUMONIAE (resistant to Ampicillin) -Completed 7 days of antibiotics. -White blood cell count remains elevated but trending down -Could be secondary to steroids, neutrophilic predominance -No further antibiotics -Monitor vitals and fever curve  Acute systolic congestive heart failure Acute non-ST elevation MI New diagnosis of A. fib with RVR -Patient's ejection fraction on echocardiogram is 30% -Was previously on cardiac inotrope, weaned off -Completed course of IV heparin -Cardiology following Plan: Continue dual antiplatelet epi aspirin Plavix Continue Lasix 40 mg IV daily Metoprolol 25 mg p.o. twice daily  when Dobbhoff tube  placed Entresto 24/26 twice daily when Dobbhoff placed Escalate goal-directed medical therapy as able Continue daily weights and strict I's and O's Cardiology following, recommendations appreciated Will need cardiac catheterization during this admission  Acute metabolic/toxic encephalopathy Patient off continuous sedation Still markedly encephalopathic Was on buprenorphine/naloxone as outpatient.  Currently held Scheduled Klonopin also held MRI brain negative on 09/13/2020 Plan: Avoid sedatives Frequent neurochecks Frequent reorientation Transfer to PCU Therapy evaluations when able  DVT prophylaxis: SCDs Code Status: Full Family Communication: Daughter Amy [phone] on 5/8 Disposition Plan: Status is: Inpatient  Remains inpatient appropriate because:Inpatient level of care appropriate due to severity of illness   Dispo: The patient is from: Home              Anticipated d/c is to: SNF              Patient currently is not medically stable to d/c.   Difficult to place patient No  Resolving respiratory failure.  Acute on chronic systolic and diastolic congestive heart failure, coronary artery disease with associated NSTEMI.  Will need a cardiac catheterization.  Disposition plan pending.     Level of care: Progressive Cardiac  Consultants:   Cardiology   Procedures: Endotracheal intubation  Antimicrobials:  None   Subjective: Seen and examined.  Confused  Objective: Vitals:   09/19/20 0800 09/19/20 0900 09/19/20 1000 09/19/20 1311  BP: (!) 144/83 (!) 143/67 132/69 (!) 112/48  Pulse: (!) 106 (!) 103 (!) 102 (!) 102  Resp: (!) 22 20 19    Temp: 97.6 F (36.4 C)     TempSrc:      SpO2: 99% 91% 92%   Weight:      Height:        Intake/Output Summary (Last 24 hours) at 09/19/2020 1346 Last data filed at 09/19/2020 0700 Gross per 24 hour  Intake 200 ml  Output 900 ml  Net -700 ml   Filed Weights   09/15/20 0500 09/17/20 0445  09/19/20 0500  Weight: 68.3 kg 66.4 kg 67 kg    Examination:  General exam: No acute distress.  Confused Respiratory system: Scattered crackles bilaterally.  Normal work of breathing.  2 L Cardiovascular system: S1-S2, regular rate and rhythm, no murmurs Gastrointestinal system: Abdomen is nondistended, soft and nontender. No organomegaly or masses felt. Normal bowel sounds heard. Central nervous system: Alert, oriented to person only.  No focal deficits  extremities: Symmetric 5 x 5 power. Skin: No rashes, lesions or ulcers Psychiatry: Judgement and insight appear impaired. Mood & affect confused.     Data Reviewed: I have personally reviewed following labs and imaging studies  CBC: Recent Labs  Lab 09/13/20 0439 09/14/20 0523 09/15/20 0552 09/16/20 0433 09/16/20 0500 09/17/20 0406 09/18/20 0550 09/19/20 0418  WBC 18.1* 10.9*   < > 21.7* 21.4* 28.8* 34.3* 28.5*  NEUTROABS 13.8* 8.1*  --   --  14.1*  --   --   --   HGB 12.5 11.3*   < > 11.3* 11.3* 12.3 13.5 14.3  HCT 39.9 36.5   < > 36.7 37.0 40.4 43.2 46.5*  MCV 96.8 96.8   < > 97.1 97.9 98.1 97.1 97.3  PLT 277 215   < > 250 256 280 358 361   < > = values in this interval not displayed.   Basic Metabolic Panel: Recent Labs  Lab 09/15/20 0552 09/16/20 0433 09/17/20 0004 09/17/20 0406 09/18/20 0550 09/19/20 0418  NA 143 142 138 139 139 143  K 4.5  4.2 4.4 4.3 3.3* 5.3*  CL 114* 108 99 100 101 106  CO2 25 25 28 28 27 22   GLUCOSE 120* 141* 122* 107* 92 89  BUN 62* 60* 65* 64* 52* 67*  CREATININE 0.62 0.54 0.70 0.66 0.51 0.66  CALCIUM 8.3* 8.6* 8.5* 8.9 9.0 9.3  MG 2.4 2.2 2.1 2.2 2.3 2.5*  PHOS 4.1 3.8  --  4.5 3.5 4.4   GFR: Estimated Creatinine Clearance: 50.5 mL/min (by C-G formula based on SCr of 0.66 mg/dL). Liver Function Tests: No results for input(s): AST, ALT, ALKPHOS, BILITOT, PROT, ALBUMIN in the last 168 hours. No results for input(s): LIPASE, AMYLASE in the last 168 hours. No results for  input(s): AMMONIA in the last 168 hours. Coagulation Profile: No results for input(s): INR, PROTIME in the last 168 hours. Cardiac Enzymes: No results for input(s): CKTOTAL, CKMB, CKMBINDEX, TROPONINI in the last 168 hours. BNP (last 3 results) No results for input(s): PROBNP in the last 8760 hours. HbA1C: No results for input(s): HGBA1C in the last 72 hours. CBG: Recent Labs  Lab 09/18/20 1939 09/18/20 2350 09/19/20 0300 09/19/20 0720 09/19/20 1114  GLUCAP 106* 89 100* 96 92   Lipid Profile: No results for input(s): CHOL, HDL, LDLCALC, TRIG, CHOLHDL, LDLDIRECT in the last 72 hours. Thyroid Function Tests: No results for input(s): TSH, T4TOTAL, FREET4, T3FREE, THYROIDAB in the last 72 hours. Anemia Panel: No results for input(s): VITAMINB12, FOLATE, FERRITIN, TIBC, IRON, RETICCTPCT in the last 72 hours. Sepsis Labs: Recent Labs  Lab 09/12/20 1404 09/13/20 0439  PROCALCITON  --  0.45  LATICACIDVEN 1.3  --     Recent Results (from the past 240 hour(s))  Urine culture     Status: Abnormal   Collection Time: 09/10/20  2:14 PM   Specimen: In/Out Cath Urine  Result Value Ref Range Status   Specimen Description   Final    IN/OUT CATH URINE Performed at [facility], [address] [place], [place] [address]    Special Requests   Final    NONE Performed at [facility], [place], [place] [address]    Culture 20,000 COLONIES/mL ESCHERICHIA COLI (A)  Final   Report Status 09/13/2020 FINAL  Final   Organism ID, Bacteria ESCHERICHIA COLI (A)  Final      Susceptibility   Escherichia coli - MIC*    AMPICILLIN >=32 RESISTANT Resistant     CEFAZOLIN <=4 SENSITIVE Sensitive     CEFEPIME <=0.12 SENSITIVE Sensitive     CEFTRIAXONE <=0.25 SENSITIVE Sensitive     CIPROFLOXACIN >=4 RESISTANT Resistant     GENTAMICIN <=1 SENSITIVE Sensitive     IMIPENEM <=0.25 SENSITIVE Sensitive     NITROFURANTOIN <=16 SENSITIVE Sensitive     TRIMETH/SULFA  <=20 SENSITIVE Sensitive     AMPICILLIN/SULBACTAM >=32 RESISTANT Resistant     PIP/TAZO <=4 SENSITIVE Sensitive     * 20,000 COLONIES/mL ESCHERICHIA COLI  MRSA PCR Screening     Status: Abnormal   Collection Time: 09/10/20  2:14 PM   Specimen: Nasal Mucosa; Nasopharyngeal  Result Value Ref Range Status   MRSA by PCR POSITIVE (A) NEGATIVE Final    Comment:        The GeneXpert MRSA Assay (FDA approved for NASAL specimens only), is one component of a comprehensive MRSA colonization surveillance program. It is not intended to diagnose MRSA infection nor to guide or monitor treatment for MRSA infections. CRITICAL RESULT CALLED TO, READ BACK BY AND VERIFIED  WITH: [facility] AT 0421 09/11/20 MF Performed at [facility], [address], [place], [place] [address]   Resp Panel by RT-PCR (Flu A&B, Covid) Nasopharyngeal Swab     Status: None   Collection Time: 09/10/20  2:18 PM   Specimen: Nasopharyngeal Swab; Nasopharyngeal(NP) swabs in vial transport medium  Result Value Ref Range Status   SARS Coronavirus 2 by RT PCR NEGATIVE NEGATIVE Final    Comment: (NOTE) SARS-CoV-2 target nucleic acids are NOT DETECTED.  The SARS-CoV-2 RNA is generally detectable in upper respiratory specimens during the acute phase of infection. The lowest concentration of SARS-CoV-2 viral copies this assay can detect is 138 copies/mL. A negative result does not preclude SARS-Cov-2 infection and should not be used as the sole basis for treatment or other patient management decisions. A negative result may occur with  improper specimen collection/handling, submission of specimen other than nasopharyngeal swab, presence of viral mutation(s) within the areas targeted by this assay, and inadequate number of viral copies(<138 copies/mL). A negative result must be combined with clinical observations, patient history, and epidemiological information. The expected result is Negative.  Fact Sheet for  Patients:  EntrepreneurPulse.com.au  Fact Sheet for Healthcare Providers:  IncredibleEmployment.be  This test is no t yet approved or cleared by the [place] FDA and  has been authorized for detection and/or diagnosis of SARS-CoV-2 by FDA under an Emergency Use Authorization (EUA). This EUA will remain  in effect (meaning this test can be used) for the duration of the COVID-19 declaration under Section 564(b)(1) of the Act, 21 U.S.C.section 360bbb-3(b)(1), unless the authorization is terminated  or revoked sooner.       Influenza A by PCR NEGATIVE NEGATIVE Final   Influenza B by PCR NEGATIVE NEGATIVE Final    Comment: (NOTE) The Xpert Xpress SARS-CoV-2/FLU/RSV plus assay is intended as an aid in the diagnosis of influenza from Nasopharyngeal swab specimens and should not be used as a sole basis for treatment. Nasal washings and aspirates are unacceptable for Xpert Xpress SARS-CoV-2/FLU/RSV testing.  Fact Sheet for Patients: EntrepreneurPulse.com.au  Fact Sheet for Healthcare Providers: IncredibleEmployment.be  This test is not yet approved or cleared by the [place] FDA and has been authorized for detection and/or diagnosis of SARS-CoV-2 by FDA under an Emergency Use Authorization (EUA). This EUA will remain in effect (meaning this test can be used) for the duration of the COVID-19 declaration under Section 564(b)(1) of the Act, 21 U.S.C. section 360bbb-3(b)(1), unless the authorization is terminated or revoked.  Performed at [facility], [address] [place], [place] [address]   Blood Culture (routine x 2)     Status: None   Collection Time: 09/10/20  4:00 PM   Specimen: BLOOD  Result Value Ref Range Status   Specimen Description BLOOD RIGHT ANTECUBITAL  Final   Special Requests   Final    BOTTLES DRAWN AEROBIC AND ANAEROBIC Blood Culture adequate volume   Culture   Final     NO GROWTH 5 DAYS Performed at [facility], [address], [place], [place] [address]    Report Status 09/15/2020 FINAL  Final  Blood Culture (routine x 2)     Status: None   Collection Time: 09/10/20  4:00 PM   Specimen: BLOOD  Result Value Ref Range Status   Specimen Description BLOOD LEFT ANTECUBITAL  Final   Special Requests   Final    BOTTLES DRAWN AEROBIC AND ANAEROBIC Blood Culture adequate volume   Culture  Final    NO GROWTH 5 DAYS Performed at [facility], [address] [place], [place] [address]    Report Status 09/15/2020 FINAL  Final  Culture, Respiratory w Gram Stain     Status: None   Collection Time: 09/11/20  3:00 AM   Specimen: Tracheal Aspirate; Respiratory  Result Value Ref Range Status   Specimen Description   Final    TRACHEAL ASPIRATE Performed at [facility], [address] [place], [place] [address]    Special Requests   Final    NONE Performed at [facility], [address] [place], [place] [address]    Gram Stain   Final    MODERATE WBC PRESENT, PREDOMINANTLY PMN NO ORGANISMS SEEN Performed at [facility], [place] [address] [place], [place] [address]    Culture   Final    RARE KLEBSIELLA PNEUMONIAE RARE CANDIDA ALBICANS    Report Status 09/13/2020 FINAL  Final   Organism ID, Bacteria KLEBSIELLA PNEUMONIAE  Final      Susceptibility   Klebsiella pneumoniae - MIC*    AMPICILLIN >=32 RESISTANT Resistant     CEFAZOLIN <=4 SENSITIVE Sensitive     CEFEPIME <=0.12 SENSITIVE Sensitive     CEFTAZIDIME <=1 SENSITIVE Sensitive     CEFTRIAXONE <=0.25 SENSITIVE Sensitive     CIPROFLOXACIN <=0.25 SENSITIVE Sensitive     GENTAMICIN <=1 SENSITIVE Sensitive     IMIPENEM <=0.25 SENSITIVE Sensitive     TRIMETH/SULFA <=20 SENSITIVE Sensitive     AMPICILLIN/SULBACTAM 4 SENSITIVE Sensitive     PIP/TAZO <=4 SENSITIVE Sensitive     * RARE KLEBSIELLA PNEUMONIAE  Culture, Respiratory w Gram Stain     Status:  None   Collection Time: 09/16/20  8:35 AM   Specimen: Tracheal Aspirate; Respiratory  Result Value Ref Range Status   Specimen Description   Final    TRACHEAL ASPIRATE Performed at [facility], [address], [place], [place] [address]    Special Requests   Final    NONE Performed at [facility], [address] [place], [place] [address]    Gram Stain   Final    ABUNDANT WBC PRESENT, PREDOMINANTLY PMN FEW YEAST Performed at [facility], [place] [address] [place], [place] [address]    Culture FEW CANDIDA ALBICANS  Final   Report Status 09/19/2020 FINAL  Final         Radiology Studies: DG Abd 1 View  Result Date: 09/19/2020 CLINICAL DATA:  Nasogastric tube placement. EXAM: ABDOMEN - 1 VIEW COMPARISON:  One view chest 09/17/2020 FINDINGS: A feeding tube projects over the right upper quadrant of the abdomen consistent with tip in the distal stomach or proximal duodenum. The previously demonstrated nasogastric tube has been removed. The visualized bowel gas pattern is normal. Mild lumbar spine degenerative changes are present. IMPRESSION: Feeding tube projects to the right upper quadrant of the abdomen consistent with position in the distal stomach or proximal duodenum. Electronically Signed   By: Richardean Sale M.D.   On: 09/19/2020 12:12        Scheduled Meds: . aspirin  81 mg Oral QHS  . budesonide (PULMICORT) nebulizer solution  0.25 mg Nebulization BID  . Chlorhexidine Gluconate Cloth  6 each Topical QHS  . clopidogrel  75 mg Oral QHS  . fluticasone  1 spray Each Nare Daily  . furosemide  40 mg Intravenous Daily  . guaiFENesin  10 mL Oral Q4H  . insulin  aspart  0-15 Units Subcutaneous Q4H  . ipratropium-albuterol  3 mL Nebulization Q6H  . levothyroxine  88 mcg Oral Q0600  . mouth rinse  15 mL Mouth Rinse BID  . metoprolol tartrate  25 mg Oral BID  . oxyCODONE  7.5 mg Oral Q6H  . pantoprazole sodium  40 mg Oral Daily  .  sacubitril-valsartan  1 tablet Oral BID   Continuous Infusions: . sodium chloride 10 mL/hr at 09/17/20 0700     LOS: 9 days    Time spent: 25 minutes    Sidney Ace, MD [facility] Pager [phone]  If 7PM-7AM, please contact night-coverage 09/19/2020, 1:46 PM

## 2020-09-19 NOTE — Progress Notes (Signed)
Pharmacy Electrolyte Monitoring Consult:  Labs:  Sodium (mmol/L)  Date Value  09/19/2020 143  10/02/2019 141  03/23/2013 138   Potassium (mmol/L)  Date Value  09/19/2020 5.3 (H)  03/23/2013 4.2   Magnesium (mg/dL)  Date Value  09/19/2020 2.5 (H)  03/20/2013 2.0   Phosphorus (mg/dL)  Date Value  09/19/2020 4.4   Calcium (mg/dL)  Date Value  09/19/2020 9.3   Calcium, Total (mg/dL)  Date Value  03/23/2013 9.0   Albumin (g/dL)  Date Value  09/11/2020 3.4 (L)  10/02/2019 4.2  03/19/2013 3.3 (L)    Assessment: Patient is a 74 y/o F with medical history including CAD s/p stenting, CHF, COPD who is admitted with COPD exacerbation / pneumonia / decompensated heart failure / NSTEMI. Pharmacy has been consulted to assist with electrolyte monitoring and replacement as indicated.   Vital High Protein @50mL /hr Free water 35mL q4h  Goals of therapy: K 4-5.1 mmol/L Mg 2-2.4 mg/dL All other electrolytes within normal limits  Plan K 5.3   Mag 2.5  Phos 4.4  Scr 0.66   On Lasix 40 IV daily, Entresto 24/26mg  po bid -No electrolyte replacement at this time --Follow-up electrolytes with AM labs tomorrow  Noralee Space, PharmD 09/19/2020 10:36 AM

## 2020-09-19 NOTE — Progress Notes (Signed)
PT Cancellation Note  Patient Details Name: KEARY HANAK MRN: [id number] DOB: 04-11-1946   Cancelled Treatment:    Reason Eval/Treat Not Completed: Other (comment) Pt noted to have elevated K+ (5.3) & exertional activity contraindicated at this time. Will f/u as able & as pt is medically appropriate.  [name], PT, DPT 09/19/20, 10:43 AM   [name] 09/19/2020, 10:43 AM

## 2020-09-19 NOTE — Progress Notes (Signed)
Still very weak and frail. No IVF or tube feeding was started before today. 10 French Dobb Hoff feeding tube placed per left nurse. Verified by abdominal xray.  Given routine medications and started on Vital HP at 20 mls/hr. with q 4 hour 30 water flushes. Patient pooped -brown diarrhea x 1. Neglects right arm even though she is right hand dominant. CT shows no abnormal findings from 5/6. Slept off and on all day.

## 2020-09-20 DIAGNOSIS — R0603 Acute respiratory distress: Secondary | ICD-10-CM | POA: Diagnosis not present

## 2020-09-20 DIAGNOSIS — J441 Chronic obstructive pulmonary disease with (acute) exacerbation: Secondary | ICD-10-CM | POA: Diagnosis not present

## 2020-09-20 DIAGNOSIS — I5020 Unspecified systolic (congestive) heart failure: Secondary | ICD-10-CM | POA: Diagnosis not present

## 2020-09-20 DIAGNOSIS — J189 Pneumonia, unspecified organism: Secondary | ICD-10-CM | POA: Diagnosis not present

## 2020-09-20 DIAGNOSIS — J9601 Acute respiratory failure with hypoxia: Secondary | ICD-10-CM | POA: Diagnosis not present

## 2020-09-20 LAB — BASIC METABOLIC PANEL
Anion gap: 13 (ref 5–15)
BUN: 68 mg/dL — ABNORMAL HIGH (ref 8–23)
CO2: 25 mmol/L (ref 22–32)
Calcium: 9.1 mg/dL (ref 8.9–10.3)
Chloride: 107 mmol/L (ref 98–111)
Creatinine, Ser: 0.66 mg/dL (ref 0.44–1.00)
GFR, Estimated: >60 mL/min (ref >60)
Glucose, Bld: 124 mg/dL — ABNORMAL HIGH (ref 70–99)
Potassium: 3 mmol/L — ABNORMAL LOW (ref 3.5–5.1)
Sodium: 145 mmol/L (ref 135–145)

## 2020-09-20 LAB — CBC WITH DIFFERENTIAL/PLATELET
Abs Immature Granulocytes: 0.08 10*3/uL — ABNORMAL HIGH (ref 0.00–0.07)
Basophils Absolute: 0 10*3/uL (ref 0.0–0.1)
Basophils Relative: 0 %
Eosinophils Absolute: 0 10*3/uL (ref 0.0–0.5)
Eosinophils Relative: 0 %
HCT: 48.3 % — ABNORMAL HIGH (ref 36.0–46.0)
Hemoglobin: 14.6 g/dL (ref 12.0–15.0)
Immature Granulocytes: 0 %
Lymphocytes Relative: 31 %
Lymphs Abs: 6.2 10*3/uL — ABNORMAL HIGH (ref 0.7–4.0)
MCH: 30.1 pg (ref 26.0–34.0)
MCHC: 30.2 g/dL (ref 30.0–36.0)
MCV: 99.6 fL (ref 80.0–100.0)
Monocytes Absolute: 1.6 10*3/uL — ABNORMAL HIGH (ref 0.1–1.0)
Monocytes Relative: 8 %
Neutro Abs: 12.2 10*3/uL — ABNORMAL HIGH (ref 1.7–7.7)
Neutrophils Relative %: 61 %
Platelets: 335 10*3/uL (ref 150–400)
RBC: 4.85 MIL/uL (ref 3.87–5.11)
RDW: 15.9 % — ABNORMAL HIGH (ref 11.5–15.5)
WBC: 20.1 10*3/uL — ABNORMAL HIGH (ref 4.0–10.5)
nRBC: 0 % (ref 0.0–0.2)

## 2020-09-20 LAB — MAGNESIUM: Magnesium: 2.7 mg/dL — ABNORMAL HIGH (ref 1.7–2.4)

## 2020-09-20 LAB — PHOSPHORUS: Phosphorus: 4.2 mg/dL (ref 2.5–4.6)

## 2020-09-20 LAB — GLUCOSE, CAPILLARY
Glucose-Capillary: 115 mg/dL — ABNORMAL HIGH (ref 70–99)
Glucose-Capillary: 132 mg/dL — ABNORMAL HIGH (ref 70–99)
Glucose-Capillary: 122 mg/dL — ABNORMAL HIGH (ref 70–99)
Glucose-Capillary: 211 mg/dL — ABNORMAL HIGH (ref 70–99)
Glucose-Capillary: 197 mg/dL — ABNORMAL HIGH (ref 70–99)
Glucose-Capillary: 118 mg/dL — ABNORMAL HIGH (ref 70–99)

## 2020-09-20 MED ORDER — LEVOTHYROXINE SODIUM 88 MCG PO TABS
88.0000 ug | ORAL_TABLET | Freq: Every day | ORAL | Status: DC
  Administered 2020-09-21 – 2020-10-13 (×23): 88 ug via ORAL
  Filled 2020-09-20 (×25): qty 1

## 2020-09-20 MED ORDER — GUAIFENESIN 100 MG/5ML PO SOLN
10.0000 mL | ORAL | Status: DC | PRN
  Administered 2020-09-25 – 2020-09-26 (×2): 200 mg via ORAL
  Filled 2020-09-20 (×4): qty 10

## 2020-09-20 MED ORDER — FUROSEMIDE 10 MG/ML IJ SOLN
20.0000 mg | Freq: Every day | INTRAMUSCULAR | Status: DC
  Filled 2020-09-20: qty 2

## 2020-09-20 MED ORDER — PREDNISONE 10 MG PO TABS
20.0000 mg | ORAL_TABLET | Freq: Every day | ORAL | Status: DC
  Administered 2020-09-20: 20 mg
  Filled 2020-09-20: qty 2

## 2020-09-20 MED ORDER — LORATADINE 10 MG PO TABS
10.0000 mg | ORAL_TABLET | Freq: Every day | ORAL | Status: DC
  Administered 2020-09-20: 10 mg
  Filled 2020-09-20: qty 1

## 2020-09-20 MED ORDER — POTASSIUM CHLORIDE 20 MEQ PO PACK
40.0000 meq | PACK | ORAL | Status: AC
  Administered 2020-09-20 (×2): 40 meq
  Filled 2020-09-20 (×2): qty 2

## 2020-09-20 MED ORDER — NEPRO/CARBSTEADY PO LIQD
237.0000 mL | Freq: Three times a day (TID) | ORAL | Status: DC
  Administered 2020-09-20 – 2020-09-27 (×17): 237 mL via ORAL

## 2020-09-20 MED ORDER — LORATADINE 10 MG PO TABS
10.0000 mg | ORAL_TABLET | Freq: Every day | ORAL | Status: DC
  Administered 2020-09-21 – 2020-10-13 (×22): 10 mg via ORAL
  Filled 2020-09-20 (×22): qty 1

## 2020-09-20 MED ORDER — IPRATROPIUM-ALBUTEROL 0.5-2.5 (3) MG/3ML IN SOLN
3.0000 mL | Freq: Three times a day (TID) | RESPIRATORY_TRACT | Status: DC
  Administered 2020-09-20 – 2020-09-24 (×12): 3 mL via RESPIRATORY_TRACT
  Filled 2020-09-20 (×12): qty 3

## 2020-09-20 MED ORDER — DOCUSATE SODIUM 50 MG/5ML PO LIQD
100.0000 mg | Freq: Two times a day (BID) | ORAL | Status: DC | PRN
  Filled 2020-09-20: qty 10

## 2020-09-20 MED ORDER — OSMOLITE 1.5 CAL PO LIQD: 1000.0000 mL | ORAL | Status: DC

## 2020-09-20 MED ORDER — OXYCODONE HCL 5 MG PO TABS
7.5000 mg | ORAL_TABLET | Freq: Four times a day (QID) | ORAL | Status: DC
  Administered 2020-09-20 – 2020-09-21 (×3): 7.5 mg via ORAL
  Filled 2020-09-20 (×3): qty 2

## 2020-09-20 MED ORDER — ACETAMINOPHEN 325 MG PO TABS
650.0000 mg | ORAL_TABLET | Freq: Four times a day (QID) | ORAL | Status: DC | PRN
  Administered 2020-09-23 – 2020-10-05 (×5): 650 mg via ORAL
  Filled 2020-09-20 (×5): qty 2

## 2020-09-20 MED ORDER — ACETAMINOPHEN 650 MG RE SUPP: 650.0000 mg | Freq: Four times a day (QID) | RECTAL | Status: DC | PRN

## 2020-09-20 MED ORDER — SACUBITRIL-VALSARTAN 24-26 MG PO TABS
1.0000 | ORAL_TABLET | Freq: Two times a day (BID) | ORAL | Status: DC
  Administered 2020-09-20 – 2020-10-02 (×22): 1 via ORAL
  Filled 2020-09-20 (×24): qty 1

## 2020-09-20 MED ORDER — IBUPROFEN 100 MG/5ML PO SUSP
200.0000 mg | Freq: Once | ORAL | Status: AC
  Administered 2020-09-20: 200 mg
  Filled 2020-09-20 (×4): qty 10

## 2020-09-20 MED ORDER — FREE WATER: 100.0000 mL | Status: DC

## 2020-09-20 MED ORDER — PROSOURCE TF PO LIQD: 45.0000 mL | Freq: Every day | ORAL | Status: DC

## 2020-09-20 MED ORDER — ASPIRIN 81 MG PO CHEW
81.0000 mg | CHEWABLE_TABLET | Freq: Every day | ORAL | Status: DC
  Administered 2020-09-20 – 2020-10-12 (×23): 81 mg via ORAL
  Filled 2020-09-20 (×22): qty 1

## 2020-09-20 MED ORDER — ADULT MULTIVITAMIN W/MINERALS CH
1.0000 | ORAL_TABLET | Freq: Every day | ORAL | Status: DC
  Administered 2020-09-21 – 2020-10-13 (×22): 1 via ORAL
  Filled 2020-09-20 (×22): qty 1

## 2020-09-20 MED ORDER — PREDNISONE 10 MG PO TABS
20.0000 mg | ORAL_TABLET | Freq: Every day | ORAL | Status: DC
  Administered 2020-09-21 – 2020-09-23 (×3): 20 mg via ORAL
  Filled 2020-09-20 (×3): qty 2

## 2020-09-20 MED ORDER — PANTOPRAZOLE SODIUM 40 MG PO TBEC
40.0000 mg | DELAYED_RELEASE_TABLET | Freq: Every day | ORAL | Status: DC
  Administered 2020-09-21 – 2020-10-13 (×22): 40 mg via ORAL
  Filled 2020-09-20 (×22): qty 1

## 2020-09-20 MED ORDER — CLOPIDOGREL BISULFATE 75 MG PO TABS
75.0000 mg | ORAL_TABLET | Freq: Every day | ORAL | Status: DC
  Administered 2020-09-20 – 2020-10-12 (×23): 75 mg via ORAL
  Filled 2020-09-20 (×22): qty 1

## 2020-09-20 MED ORDER — METOPROLOL TARTRATE 25 MG PO TABS
25.0000 mg | ORAL_TABLET | Freq: Two times a day (BID) | ORAL | Status: DC
  Administered 2020-09-20 – 2020-09-21 (×3): 25 mg via ORAL
  Filled 2020-09-20 (×3): qty 1

## 2020-09-20 NOTE — Progress Notes (Signed)
PROGRESS NOTE    Kathryn Garrett  BMW:[id number] DOB: Feb 09, 1946 DOA: 09/10/2020 PCP: Imagene Riches, NP  Brief Narrative:  75 year old female presented to the [facility] ED from home with complaints of shortness of breath.  Patient reported 2 to 3 days of progressive shortness of breath and associated new onset nonproductive cough as well as a subjective fever.  Per ED documentation she denied associated chest pain/palpitations/diaphoresis.  And denies any recent nausea/vomiting, chills/rigors/myalgias.  Patient confirmed a history of COPD and current smoking history stating she is not on any supplemental oxygen at baseline.  [facility] hospitalist were consulted for admission.  Around midnight PCCM was called due to patient's deteriorating respiratory status.  Per ED provider Dr. Karma Greaser and care RN the patient complained that " I can't breathe", was given a dose of Ativan and became somnolent on BiPAP.  Coarse crackles auscultated bilaterally, and the decision was made to emergently intubate the patient and place her on mechanical ventilation. PCCM consulted for management   09/10/2020-patient admits admitted with hospitalist service for COPD exacerbation and left lower lobe pneumonia  09/11/2020-overnight patient had suspected flash pulmonary edema, dyspnea followed by somnolence requiring emergent intubation and mechanical ventilation.  Admitted to ICU  09/12/20- patient failed SBT , she had blood tinged secretions fromETT and aggitation, family was at bedside and we agreed on giving her more time and try again.   09/14/20- Failed SBT (became hypoxic with sats mid 80's, increased WOB and assessory muscle use, HR 140-160's). Will diurese today and add Metoprolol.  Tracheal aspirate from 4/30 with KLEBSIELLA PNEUMONIAE (resistant to Ampicillin)    09/15/20- Failed SBT (increased WOB and assessory muscle use, RR 40's, HR 140-150's, Hypertensive); plan to add scheduled PO Klonopin and Oxycodone, Lasix 40 mg IV  x1, consult Palliative Care  09/16/20- Worsening Leukocytosis up to 21.7, low grade fever overnight, increased secretions overnight, will repeat Tracheal aspirate, remove central line, add mucinex, plan for SBT  09/17/20-[name] passed SBT in AM and following commands still having some moderate secretions  09/18/20-  Extubated yesterday.  Failed swallow eval today.  5/8: Patient transferred to [facility] service after successful extubation.  Leukocytosis improving.  Remains markedly encephalopathic.  Unable to provide history. 5/9: NG tube in place.  Patient felt worse this morning however her status is improved over the course of the day.  Dobbhoff tube discontinued and diet advanced.  Patient tolerating p.o. intake and medications without issue.    Assessment & Plan:   Principal Problem:   Acute exacerbation of chronic obstructive pulmonary disease (COPD) ([place]) Active Problems:   CAP (community acquired pneumonia)   HTN (hypertension)   Tobacco abuse   Hyperlipidemia LDL goal <70   Severe sepsis (HCC)   NSTEMI (non-ST elevated myocardial infarction) (HCC)   Hyperglycemia   SOB (shortness of breath)   Acute respiratory failure (HCC)   HFrEF (heart failure with reduced ejection fraction) ([place])  Acute hypoxic respiratory failure secondary to Acute COPD Exacerbation & left lung multifocal pneumonia Extubated 5/7 Saturating well on 2 L Completed course of antibiotics Continue to provide supportive care  Sepsis due to left lung multifocal pneumonia>> tracheal aspirate on 4/30 with KLEBSIELLA PNEUMONIAE (resistant to Ampicillin) -Completed 7 days of antibiotics. -White blood cell count remains elevated but trending down -Could be secondary to steroids, neutrophilic predominance -No further antibiotics -Monitor vitals and fever curve  Acute systolic congestive heart failure Acute non-ST elevation MI New diagnosis of A. fib with RVR -Patient's ejection fraction on echocardiogram is  30% -Was previously on cardiac inotrope, weaned off -Completed course of IV heparin -Cardiology following Plan: Continue dual antiplatelet therapy.  Aspirin and Plavix Continue Lasix 40 mg IV daily Metoprolol 25 mg p.o. twice daily when Dobbhoff tube placed Entresto 24/26 twice daily when Dobbhoff placed Escalate goal-directed medical therapy as able Continue daily weights and strict I's and O's Cardiology following, recommendations appreciated Will need cardiac catheterization during this admission  Acute metabolic/toxic encephalopathy Patient off continuous sedation Still markedly encephalopathic Was on buprenorphine/naloxone as outpatient.  Currently held Scheduled Klonopin also held MRI brain negative on 09/13/2020 Plan: Avoid sedatives Frequent neurochecks Frequent reorientation Keep stepdown status for today Can likely downgrade tomorrow Therapy evaluations when able  DVT prophylaxis: SCDs Code Status: Full Family Communication: Daughter Amy [phone] on 5/8 Disposition Plan: Status is: Inpatient  Remains inpatient appropriate because:Inpatient level of care appropriate due to severity of illness   Dispo: The patient is from: Home              Anticipated d/c is to: SNF              Patient currently is not medically stable to d/c.   Difficult to place patient No  Resolving respiratory failure.  Acute on chronic systolic and diastolic congestive heart failure, coronary disease with associated NSTEMI.  Tentative plan for cardiac catheterization during this admission.     Level of care: Stepdown  Consultants:   Cardiology   Procedures: Endotracheal intubation  Antimicrobials:  None   Subjective: Patient seen and examined.  Confused this morning.  Objective: Vitals:   09/20/20 1100 09/20/20 1200 09/20/20 1300 09/20/20 1400  BP: (!) 98/43 (!) 88/60 (!) 112/47 103/64  Pulse: 91 91 95 98  Resp: 16 (!) 21 17 (!) 21  Temp:  97.8 F (36.6 C)     TempSrc:      SpO2: 97% 95% 94% 94%  Weight:      Height:        Intake/Output Summary (Last 24 hours) at 09/20/2020 1419 Last data filed at 09/20/2020 1000 Gross per 24 hour  Intake --  Output 1200 ml  Net -1200 ml   Filed Weights   09/17/20 0445 09/19/20 0500 09/20/20 0500  Weight: 66.4 kg 67 kg 61.1 kg    Examination:  General exam: No acute distress.  Confused Respiratory system: Scattered crackles bilaterally.  Normal work of breathing.  2 L Cardiovascular system: S1-S2, regular rate and rhythm, no murmurs Gastrointestinal system: Abdomen is nondistended, soft and nontender. No organomegaly or masses felt. Normal bowel sounds heard. Central nervous system: Alert, oriented to person only.  No focal deficits  extremities: Symmetric 5 x 5 power. Skin: No rashes, lesions or ulcers Psychiatry: Judgement and insight appear impaired. Mood & affect confused.     Data Reviewed: I have personally reviewed following labs and imaging studies  CBC: Recent Labs  Lab 09/14/20 0523 09/15/20 0552 09/16/20 0500 09/17/20 0406 09/18/20 0550 09/19/20 0418 09/20/20 0356  WBC 10.9*   < > 21.4* 28.8* 34.3* 28.5* 20.1*  NEUTROABS 8.1*  --  14.1*  --   --   --  12.2*  HGB 11.3*   < > 11.3* 12.3 13.5 14.3 14.6  HCT 36.5   < > 37.0 40.4 43.2 46.5* 48.3*  MCV 96.8   < > 97.9 98.1 97.1 97.3 99.6  PLT 215   < > 256 280 358 361 335   < > = values in this interval not displayed.  Basic Metabolic Panel: Recent Labs  Lab 09/16/20 0433 09/17/20 0004 09/17/20 0406 09/18/20 0550 09/19/20 0418 09/20/20 0356  NA 142 138 139 139 143 145  K 4.2 4.4 4.3 3.3* 5.3* 3.0*  CL 108 99 100 101 106 107  CO2 25 28 28 27 22 25   GLUCOSE 141* 122* 107* 92 89 124*  BUN 60* 65* 64* 52* 67* 68*  CREATININE 0.54 0.70 0.66 0.51 0.66 0.66  CALCIUM 8.6* 8.5* 8.9 9.0 9.3 9.1  MG 2.2 2.1 2.2 2.3 2.5* 2.7*  PHOS 3.8  --  4.5 3.5 4.4 4.2   GFR: Estimated Creatinine Clearance: 48.3 mL/min (by C-G formula  based on SCr of 0.66 mg/dL). Liver Function Tests: No results for input(s): AST, ALT, ALKPHOS, BILITOT, PROT, ALBUMIN in the last 168 hours. No results for input(s): LIPASE, AMYLASE in the last 168 hours. No results for input(s): AMMONIA in the last 168 hours. Coagulation Profile: No results for input(s): INR, PROTIME in the last 168 hours. Cardiac Enzymes: No results for input(s): CKTOTAL, CKMB, CKMBINDEX, TROPONINI in the last 168 hours. BNP (last 3 results) No results for input(s): PROBNP in the last 8760 hours. HbA1C: No results for input(s): HGBA1C in the last 72 hours. CBG: Recent Labs  Lab 09/19/20 1948 09/19/20 2349 09/20/20 0403 09/20/20 0739 09/20/20 1119  GLUCAP 124* 117* 115* 132* 122*   Lipid Profile: No results for input(s): CHOL, HDL, LDLCALC, TRIG, CHOLHDL, LDLDIRECT in the last 72 hours. Thyroid Function Tests: No results for input(s): TSH, T4TOTAL, FREET4, T3FREE, THYROIDAB in the last 72 hours. Anemia Panel: No results for input(s): VITAMINB12, FOLATE, FERRITIN, TIBC, IRON, RETICCTPCT in the last 72 hours. Sepsis Labs: No results for input(s): PROCALCITON, LATICACIDVEN in the last 168 hours.  Recent Results (from the past 240 hour(s))  Blood Culture (routine x 2)     Status: None   Collection Time: 09/10/20  4:00 PM   Specimen: BLOOD  Result Value Ref Range Status   Specimen Description BLOOD RIGHT ANTECUBITAL  Final   Special Requests   Final    BOTTLES DRAWN AEROBIC AND ANAEROBIC Blood Culture adequate volume   Culture   Final    NO GROWTH 5 DAYS Performed at [facility], [address], [place], [place] [address]    Report Status 09/15/2020 FINAL  Final  Blood Culture (routine x 2)     Status: None   Collection Time: 09/10/20  4:00 PM   Specimen: BLOOD  Result Value Ref Range Status   Specimen Description BLOOD LEFT ANTECUBITAL  Final   Special Requests   Final    BOTTLES DRAWN AEROBIC AND ANAEROBIC Blood Culture adequate volume    Culture   Final    NO GROWTH 5 DAYS Performed at [facility], [address] [place], [place] [address]    Report Status 09/15/2020 FINAL  Final  Culture, Respiratory w Gram Stain     Status: None   Collection Time: 09/11/20  3:00 AM   Specimen: Tracheal Aspirate; Respiratory  Result Value Ref Range Status   Specimen Description   Final    TRACHEAL ASPIRATE Performed at [facility], [address] [place], [place] [address]    Special Requests   Final    NONE Performed at [facility], [address] [place], [place] [address]    Gram Stain   Final    MODERATE WBC PRESENT, PREDOMINANTLY PMN NO ORGANISMS SEEN Performed at [facility], [place]  [address] [place], [place] [address]    Culture   Final    RARE KLEBSIELLA PNEUMONIAE RARE CANDIDA ALBICANS    Report Status 09/13/2020 FINAL  Final   Organism ID, Bacteria KLEBSIELLA PNEUMONIAE  Final      Susceptibility   Klebsiella pneumoniae - MIC*    AMPICILLIN >=32 RESISTANT Resistant     CEFAZOLIN <=4 SENSITIVE Sensitive     CEFEPIME <=0.12 SENSITIVE Sensitive     CEFTAZIDIME <=1 SENSITIVE Sensitive     CEFTRIAXONE <=0.25 SENSITIVE Sensitive     CIPROFLOXACIN <=0.25 SENSITIVE Sensitive     GENTAMICIN <=1 SENSITIVE Sensitive     IMIPENEM <=0.25 SENSITIVE Sensitive     TRIMETH/SULFA <=20 SENSITIVE Sensitive     AMPICILLIN/SULBACTAM 4 SENSITIVE Sensitive     PIP/TAZO <=4 SENSITIVE Sensitive     * RARE KLEBSIELLA PNEUMONIAE  Culture, Respiratory w Gram Stain     Status: None   Collection Time: 09/16/20  8:35 AM   Specimen: Tracheal Aspirate; Respiratory  Result Value Ref Range Status   Specimen Description   Final    TRACHEAL ASPIRATE Performed at [facility], [address], [place], [place] [address]    Special Requests   Final    NONE Performed at [facility], [address] [place], [place] [address]    Gram Stain   Final    ABUNDANT WBC PRESENT,  PREDOMINANTLY PMN FEW YEAST Performed at [facility], [place] [address], [place], [place] [address]    Culture FEW CANDIDA ALBICANS  Final   Report Status 09/19/2020 FINAL  Final         Radiology Studies: DG Abd 1 View  Result Date: 09/19/2020 CLINICAL DATA:  Nasogastric tube placement. EXAM: ABDOMEN - 1 VIEW COMPARISON:  One view chest 09/17/2020 FINDINGS: A feeding tube projects over the right upper quadrant of the abdomen consistent with tip in the distal stomach or proximal duodenum. The previously demonstrated nasogastric tube has been removed. The visualized bowel gas pattern is normal. Mild lumbar spine degenerative changes are present. IMPRESSION: Feeding tube projects to the right upper quadrant of the abdomen consistent with position in the distal stomach or proximal duodenum. Electronically Signed   By: Richardean Sale M.D.   On: 09/19/2020 12:12        Scheduled Meds: . aspirin  81 mg Per Tube QHS  . budesonide (PULMICORT) nebulizer solution  0.25 mg Nebulization BID  . Chlorhexidine Gluconate Cloth  6 each Topical QHS  . clopidogrel  75 mg Per Tube QHS  . fluticasone  1 spray Each Nare Daily  . [START ON 09/21/2020] furosemide  20 mg Intravenous Daily  . insulin aspart  0-15 Units Subcutaneous Q4H  . ipratropium-albuterol  3 mL Nebulization TID  . levothyroxine  88 mcg Per Tube Q0600  . loratadine  10 mg Per Tube Daily  . mouth rinse  15 mL Mouth Rinse BID  . metoprolol tartrate  25 mg Per Tube BID  . oxyCODONE  7.5 mg Per Tube Q6H  . pantoprazole sodium  40 mg Per Tube Daily  . predniSONE  20 mg Per Tube Q breakfast  . sacubitril-valsartan  1 tablet Per Tube BID   Continuous Infusions: . sodium chloride 10 mL/hr at 09/17/20 0700     LOS: 10 days    Time spent: 15 minutes    Sidney Ace, MD [facility] Pager [phone]  If 7PM-7AM, please contact night-coverage 09/20/2020, 2:19 PM

## 2020-09-20 NOTE — TOC Progression Note (Signed)
Transition of Care [facility]) - Progression Note    Patient Details  Name: Kathryn Garrett MRN: [id number] Date of Birth: 1946-05-04  Transition of Care [facility]) CM/SW Shannon, Dwight Phone Number: [phone] 09/20/2020, 1:54 PM  Clinical Narrative:     PT recommended CIR placement.  Caitlin PT from CIR has placed rehab consult.     Barriers to Discharge: Continued Medical Work up  Expected Discharge Plan and Services   In-house Referral: Clinical Social Work   Post Acute Care Choice:  [facility]) Living arrangements for the past 2 months: Single Family Home                                       Social Determinants of Health (SDOH) Interventions    Readmission Risk Interventions No flowsheet data found.

## 2020-09-20 NOTE — Progress Notes (Signed)
Inpatient Rehab Admissions Coordinator:    at this time I will place a rehab consult order per our protocol.   Shann Medal, PT, DPT Admissions Coordinator [phone] 09/20/20  12:39 PM

## 2020-09-20 NOTE — Progress Notes (Signed)
Progress Note  Patient Name: Kathryn Garrett Date of Encounter: 09/20/2020  Primary Cardiologist: Larae Grooms, MD  Subjective   Was very thirsty but has since passed swallow eval.  No chest pain or dyspnea.  Inpatient Medications    Scheduled Meds: . aspirin  81 mg Per Tube QHS  . budesonide (PULMICORT) nebulizer solution  0.25 mg Nebulization BID  . Chlorhexidine Gluconate Cloth  6 each Topical QHS  . clopidogrel  75 mg Per Tube QHS  . feeding supplement (VITAL HIGH PROTEIN)  1,000 mL Per Tube Q24H  . fluticasone  1 spray Each Nare Daily  . furosemide  40 mg Intravenous Daily  . insulin aspart  0-15 Units Subcutaneous Q4H  . ipratropium-albuterol  3 mL Nebulization TID  . levothyroxine  88 mcg Per Tube Q0600  . loratadine  10 mg Per Tube Daily  . mouth rinse  15 mL Mouth Rinse BID  . metoprolol tartrate  25 mg Per Tube BID  . oxyCODONE  7.5 mg Per Tube Q6H  . pantoprazole sodium  40 mg Per Tube Daily  . potassium chloride  40 mEq Per Tube Q4H  . sacubitril-valsartan  1 tablet Per Tube BID   Continuous Infusions: . sodium chloride 10 mL/hr at 09/17/20 0700   PRN Meds: acetaminophen **OR** acetaminophen, albuterol, artificial tears, docusate, guaiFENesin, metoprolol tartrate, morphine injection, polyethylene glycol   Vital Signs    Vitals:   09/20/20 0800 09/20/20 0900 09/20/20 1000 09/20/20 1004  BP: 102/67 121/63 (!) 102/50 (!) 102/50  Pulse: 98 (!) 105 (!) 119 (!) 118  Resp: 17 17 (!) 25   Temp: 97.8 F (36.6 C)     TempSrc: Axillary     SpO2: 93% 93% 96%   Weight:      Height:        Intake/Output Summary (Last 24 hours) at 09/20/2020 1058 Last data filed at 09/20/2020 1000 Gross per 24 hour  Intake --  Output 1200 ml  Net -1200 ml   Filed Weights   09/17/20 0445 09/19/20 0500 09/20/20 0500  Weight: 66.4 kg 67 kg 61.1 kg    Physical Exam   GEN: Well nourished, well developed, in no acute distress.  HEENT: Grossly normal.  Neck: Supple,  difficult to gauge JVP 2/2 body habitus.  No carotid bruits, or masses. Cardiac: RRR, no murmurs, rubs, or gallops. No clubbing, cyanosis, edema.  DP/PT 1+ and equal bilaterally.  Respiratory:  Respirations regular and unlabored, occas, faint exp wheeze, but otw clear to auscultation bilaterally. GI: Soft, nontender, nondistended, BS + x 4. MS: no deformity or atrophy. Skin: warm and dry, no rash. Neuro:  Strength and sensation are intact. Psych: AAOx3.  Normal affect.  Labs    Chemistry Recent Labs  Lab 09/18/20 0550 09/19/20 0418 09/20/20 0356  NA 139 143 145  K 3.3* 5.3* 3.0*  CL 101 106 107  CO2 27 22 25   GLUCOSE 92 89 124*  BUN 52* 67* 68*  CREATININE 0.51 0.66 0.66  CALCIUM 9.0 9.3 9.1  GFRNONAA >60 >60 >60  ANIONGAP 11 15 13      Hematology Recent Labs  Lab 09/18/20 0550 09/19/20 0418 09/20/20 0356  WBC 34.3* 28.5* 20.1*  RBC 4.45 4.78 4.85  HGB 13.5 14.3 14.6  HCT 43.2 46.5* 48.3*  MCV 97.1 97.3 99.6  MCH 30.3 29.9 30.1  MCHC 31.3 30.8 30.2  RDW 15.7* 15.5 15.9*  PLT 358 361 335    Cardiac Enzymes  Recent Labs  Lab 09/10/20 2050 09/11/20 0437 09/11/20 1433 09/11/20 1611 09/11/20 2011  TROPONINIHS 1,838* 2,434* 2,265* 2,455* 2,317*     Lipids  Lab Results  Component Value Date   CHOL 107 10/02/2019   HDL 41 10/02/2019   LDLCALC 43 10/02/2019   TRIG 183 (H) 09/15/2020   CHOLHDL 2.6 10/02/2019    HbA1c  Lab Results  Component Value Date   HGBA1C 6.4 (H) 09/11/2020    Radiology    DG Abd 1 View  Result Date: 09/19/2020 CLINICAL DATA:  Nasogastric tube placement. EXAM: ABDOMEN - 1 VIEW COMPARISON:  One view chest 09/17/2020 FINDINGS: A feeding tube projects over the right upper quadrant of the abdomen consistent with tip in the distal stomach or proximal duodenum. The previously demonstrated nasogastric tube has been removed. The visualized bowel gas pattern is normal. Mild lumbar spine degenerative changes are present. IMPRESSION: Feeding  tube projects to the right upper quadrant of the abdomen consistent with position in the distal stomach or proximal duodenum. Electronically Signed   By: Richardean Sale M.D.   On: 09/19/2020 12:12   CT HEAD WO CONTRAST  Result Date: 09/17/2020 CLINICAL DATA:  Acute neuro deficit, stroke suspected, acute COPD exacerbation EXAM: CT HEAD WITHOUT CONTRAST TECHNIQUE: Contiguous axial images were obtained from the base of the skull through the vertex without intravenous contrast. COMPARISON:  09/11/2020 CT, MRI 09/13/2020 FINDINGS: Brain: Stable benign mineralization in the bilateral basal ganglia. No evidence of acute infarction, hemorrhage, hydrocephalus, extra-axial collection, visible mass lesion or mass effect. Symmetric prominence of the ventricles, cisterns and sulci compatible with parenchymal volume loss. Patchy areas of white matter hypoattenuation are most compatible with chronic microvascular angiopathy. Vascular: Atherosclerotic calcification of the carotid siphons. No hyperdense vessel. Skull: No calvarial fracture or suspicious osseous lesion. No scalp swelling or hematoma. Sinuses/Orbits: Layering air-fluid level noted in the left maxillary sinus with some mild thickening in the ethmoids. Mastoid air cells and middle ear cavities are clear. Prior bilateral lens extractions. Stable mineralization along the left lateral rectus insertion. Orbits otherwise unremarkable. Other: None. IMPRESSION: No acute intracranial abnormality. If there is persisting concern for acute infarction, MRI is more sensitive and specific for early features of ischemia. Air-fluid level left maxillary sinus, possibly related to instrumentation versus sinusitis. Electronically Signed   By: Lovena Le M.D.   On: 09/17/2020 04:53   DG Chest Port 1 View  Result Date: 09/17/2020 CLINICAL DATA:  Hypoxia, increased respiratory distress, history coronary artery disease post MI, COPD, hypertension, former smoker EXAM: PORTABLE CHEST 1  VIEW COMPARISON:  Portable exam 0922 hours compared to 09/16/2020 FINDINGS: Tip of endotracheal tube projects 3.0 cm above carina. Nasogastric tube coiled in proximal stomach. Upper normal heart size with slight pulmonary vascular congestion. Mediastinal contours normal. Atherosclerotic calcification aorta. Lungs clear. No definite infiltrate, pleural effusion, or pneumothorax. IMPRESSION: No acute abnormalities. Aortic Atherosclerosis (ICD10-I70.0). Electronically Signed   By: Lavonia Dana M.D.   On: 09/17/2020 12:36    Telemetry    RSR - sinus tachycardia w/ brief runs of SVT up to 170 - Personally Reviewed  Cardiac Studies   2D echo 09/11/2020: 1. Recommend limited echo with iv contrast agent (definity) for addequate  assessment of LVEF and wall motion.. Left ventricular ejection fraction,  by estimation, is 25 to 30%. The left ventricle has severely decreased  function. Left ventricular  endocardial border not optimally defined to evaluate regional wall motion.  Left ventricular diastolic parameters are indeterminate.   2. Right ventricular systolic  function is normal. The right ventricular  size is normal.   3. The mitral valve was not well visualized. Mild mitral valve  regurgitation.   4. The aortic valve was not well visualized. Aortic valve regurgitation  is not visualized.  __________   LHC 05/2016:  Ost RCA lesion, 30 %stenosed.  LM lesion, 30 %stenosed.  Ost LAD lesion, 50 %stenosed - looks similar to prior catheterization. FFR 0.87  The left ventricular systolic function is normal.  LV end diastolic pressure is normal.  The left ventricular ejection fraction is 55-65% by visual estimate.  There is mild (2+) mitral regurgitation.  _______CULPRIT LESION_____________  Prox RCA Overlapped BMS-DES stents, 99 % -n-stent re-stenosed.  PTCA with 3.0 mm Sc[place] Balloon with Post-dilation using 3.5 mm [place] Balloon was perfromed. Post intervention, there is a 0% residual  stenosis.   Recurrent in-stent restenosis of the overlapped stent (BMS and DES) in proximal RCA treated with aggressive angioplasty. Repeat FFR on ostial LAD confirms this is not likely significant.     Plan:  She'll be transferred to 6 [facility] post procedure unit for sheath removal and post PCI care.  Continue aggressive risk factor modification  Continue aspirin and Plavix lifelong  Smoking cessation counseling is mandatory to maintain stent patency __________   LHC 01/2016:  Ost LAD to Prox LAD lesion, 40 %stenosed. Similar to prior cath.  The left ventricular systolic function is normal.  LV end diastolic pressure is normal.  The left ventricular ejection fraction is 55-65% by visual estimate.  There is no aortic valve stenosis.  Prox RCA lesion, 75 %stenosed, in-stent restenosis.  Post intervention with 3.0 x 10 Angiosculpt inflated to 3.5 mm, there is a 0% residual stenosis.   Continue aggressive secondary prevention.  Clopidogrel restarted. _________   2D echo 09/2015: - Left ventricle: The cavity size was normal. Systolic function was    normal. The estimated ejection fraction was in the range of 60%    to 65%. Wall motion was normal; there were no regional wall    motion abnormalities. Doppler parameters are consistent with    abnormal left ventricular relaxation (grade 1 diastolic    dysfunction).  - Right ventricle: Systolic function was normal.  - Pulmonary arteries: Systolic pressure was within the normal    range.   Impressions:   - Normal study.   Patient Profile    75 y.o. female with history of CAD with VF arrest s/p PCI to the RCA in 2017 s/p PTCA to the RCA for ISR in 2018, chronic combined systolic and diastolic CHF, COPD with ongoing tobacco use, HTN, HLD, anemia, and narcotic abuse on Suboxone therapy admitted with acute hypoxic respiratory failure requiring mechanical ventilation secondary to COPD exacerbation Klebsiella PNA and acute on  chronic combined CHF with transient ST elevation with subsequent rise on HS-Tn to 2455 and echo demonstrating new LV dysfunction requiring augmentation with milrinone for diuresis. Extubated 5/6.  Assessment & Plan    1.  Acute on chronic combined systolic and diastolic congestive heart failure: Admitted April 29 with acute respiratory failure, multifactorial pneumonia, COPD exacerbation, and volume overload.  Briefly required milrinone for augmentation of IV diuresis this was discontinued May 2.  Extubated May 6.  Previously failed swallow eval but then just passed this morning.  EF 25 to 30% by echo this admission.  I's and O's indicate a net positive of 182.5 mL, though her weight is listed as down to 61.1 kg.  On examination, she appears  relatively euvolemic.  Creatinine has been stable though BUN is bumping was 68 this morning.  Bicarb stable.  Will reduce Lasix to 20 mg daily and can likely be transition to p.o. since she has passed her swallow eval.  Continue beta-blocker and Entresto with eye toward consolidating beta-blocker to Toprol-XL.  Blood pressures are relatively soft and thus there is not much room to add MRA or titrate Entresto further.  As previously noted, will plan on right and left heart cardiac catheterization, hopefully later this week in the setting of ongoing clinical improvement.  2.  Coronary artery disease/non-STEMI: Patient has no recollection of symptoms prior to admission but denies any chest pain or dyspnea currently.  As above, EF 25 to 30% by echo this admission, which is down from prior readings.  She remains on aspirin, Plavix, beta-blocker, and statin therapy and we will plan on diagnostic catheterization, hopefully later this week in the setting of ongoing clinical improvement.  3.  Acute respiratory failure with hypoxia/multifactoral pneumonia/COPD exacerbation: Extubated May 6.  Management per internal medicine.  4.  Paroxysmal atrial fibrillation/SVT: Brief  episodes of tachycardia/SVT noted on telemetry.  Continue beta-blocker therapy.  5.  Hyperlipidemia: Continue statin therapy.  6.  Hypokalemia: Potassium was 3.0 this morning.  Supplementation ordered by primary team.  Signed, Murray Hodgkins, NP  09/20/2020, 10:58 AM    For questions or updates, please contact   Please consult [email or web address] for contact info under Cardiology/STEMI.

## 2020-09-20 NOTE — Progress Notes (Addendum)
Nutrition Follow Up Note   DOCUMENTATION CODES:   Obesity unspecified  INTERVENTION:   Nepro Shake po TID, each supplement provides 425 kcal and 19 grams protein  Magic cup TID with meals, each supplement provides 290 kcal and 9 grams of protein  MVI po daily   NUTRITION DIAGNOSIS:   Inadequate oral intake related to inability to eat (pt sedated and ventilated) as evidenced by NPO status.  GOAL:   Patient will meet greater than or equal to 90% of their needs  MONITOR:   Labs,Weight trends,Skin,I & O's,TF tolerance  ASSESSMENT:   75 y/o female with h/o COPD, HTN, NSTEMI, CHF and lung mass who is admitted with PNA and possible acute a stroke involving left MCA territory   Pt extubated 5/7. Nasogastric tube placed 5/8 as pt with some confusion and unable to have oral intake. Plan was to initiate tube feeds today via NGT, but patient seen by SLP and was initiated on a diet. NGT discontinued per MD. Pt lethargic at time of RD visit today. RD will add supplements to help pt meet her estimated needs. Plan is for possible transfer to CIR once appropriate. Per chart, pt is down ~16lbs since admit but appears to be back at her UBW.   Medications reviewed and include: aspirin, plavix, lasix, insulin. Synthroid, oxycodone, protonix, prednisone  Labs reviewed: K 3.0(L), BUN 68(H), P 4.2 wnl, Mg 2.7(H) Wbc- 20.1(H) cbgs- 115, 132, 122 x 24 hrs  Diet Order:   Diet Order            Diet NPO time specified  Diet effective now                EDUCATION NEEDS:   No education needs have been identified at this time  Skin:  Skin Assessment: Reviewed RN Assessment  Last BM:  5/8- type 7  Height:   Ht Readings from Last 1 Encounters:  09/15/20 4\' 11"  (1.499 m)    Weight:   Wt Readings from Last 1 Encounters:  09/20/20 61.1 kg    Ideal Body Weight:  44.5 kg  BMI:  Body mass index is 27.21 kg/m.  Estimated Nutritional Needs:   Kcal:  1400-1600kcal/day  Protein:   70-80g/day  Fluid:  1.4L/day  Kathryn Distance MS, RD, LDN Please refer to [facility] for RD and/or RD on-call/weekend/after hours pager

## 2020-09-20 NOTE — Progress Notes (Signed)
Speech Language Pathology Treatment: Dysphagia  Patient Details Name: Kathryn Garrett MRN: [id number] DOB: 03/05/1946 Today's Date: 09/20/2020 Time: 1040-1130 SLP Time Calculation (min) (ACUTE ONLY): 50 min  Assessment / Plan / Recommendation Clinical Impression  Pt seen for ongoing assessment of swallowing. She appears much improved and alert today; verbally responsive and following instructions w/ min cues -- easily distracted and weak UEs bilat. Pt has been orally extubated since 09/17/2020. She currently has a NG in place for meds, nutrition. Pt is on 2L [place] O2 support. Oral care completed; min debris on mid-tongue but removable.   Pt explained general aspiration precautions and agreed verbally to the need for following them especially sitting upright for all oral intake and taking Small, Single sips(min cues needed though during trials). Pt assisted w/ positioning d/t weakness then given trials of ice chips, thin and Nectar liquids, and purees. Overt, clinical s/s of aspiration were noted w/ trials of ice chips and thin liquids c/b a delayed cough fairly consistently. NO overt, clinical s/s of aspiration were noted w/ trials of Nectar liquids or purees; respiratory status remained calm and unlabored, vocal quality clear b/t trials, no cough. Pt helped to hold Cup when drinking following instructions for single, small sips slowly -- quite weak UEs bilat. NO straws were utiilized for better oral control. Oral phase appeared grossly [facility] for bolus management and timely A-P transfer for swallowing; oral clearing achieved w/ all consistencies. No solids attempted d/t pt not wearing her Dentures recently, and NG present.  Discussed pt's presentation w/ NSG and MD; recommended for NG removal and initiation of a Dysphagia level 1 diet (puree) w/ gravies added to moisten foods; Nectar liquids VIA Cup or Spoon. Recommend general aspiration precautions; Pills Crushed or Whole in Puree; tray setup, positioning  assistance, and feeding assistance for meals d/t weakness and ease of Distraction/mental status. ST services will continue to f/u w/ pt for toleration of diet, trials to upgrade consistency, and education while admitted. MD/NSG updated, agreed. Precautions posted at bedside.  Education given to pt on dysphagia/swallowing; general aspiration precautions; food and liquid consistencies to start; practice wearing Dentures next 1-2 days for trials to upgrade to soft solid foods.     HPI HPI: Pt is a 75 yo female admitted for COPD exacerbation and left lower lobe pneumonia -- lung mass; and possible acute a stroke involving left MCA territory though Head CT and MRI revealed: "No acute brain finding. Mild chronic small-vessel ischemic change".  Tracheal aspirate from 4/30 with KLEBSIELLA PNEUMONIAE (resistant to Ampicillin). Head CT 09/17/2020 No acute intracranial abnormality.Past medical history including HRrEF, tobacco abuse, CAD s/p stenting, CHF, COPD who is admitted with COPD exacerbation / pneumonia / decompensated heart failure / NSTEMI.  Pt intubated 09/11/2020 thru 09/17/2020. Extubated then w/ NG placed for meds, po's.      SLP Plan  Continue with current plan of care (mbss TBD)       Recommendations  Diet recommendations: Dysphagia 1 (puree);Nectar-thick liquid Liquids provided via: Cup;Teaspoon;No straw Medication Administration: Crushed with puree (vs Whole in Puree for safer swallowing) Supervision: Staff to assist with self feeding;Full supervision/cueing for compensatory strategies Compensations: Minimize environmental distractions;Slow rate;Small sips/bites;Lingual sweep for clearance of pocketing;Follow solids with liquid Postural Changes and/or Swallow Maneuvers: Seated upright 90 degrees;Upright 30-60 min after meal;Out of bed for meals                General recommendations:  (Dietician f/u) Oral Care Recommendations: Oral care BID;Oral care before and  after PO;Staff/trained  caregiver to provide oral care Follow up Recommendations:  (TBD) SLP Visit Diagnosis: Dysphagia, pharyngeal phase (R13.13) Plan: Continue with current plan of care (mbss TBD)       GO                 [name], MS, CCC-SLP Speech Language Pathologist Rehab Services [phone] [facility] 09/20/2020, 12:08 PM

## 2020-09-20 NOTE — Progress Notes (Signed)
Given medications after she passed swallow evaluation. Patient did excellent. Dobb Hoff tube pulled per verbal order Dr. Priscella Mann. Patient pleased to have [place] tube out and taking pos.

## 2020-09-20 NOTE — Progress Notes (Signed)
Physical Therapy Treatment Patient Details Name: Kathryn Garrett MRN: [id number] DOB: 02-23-1946 Today's Date: 09/20/2020    History of Present Illness 75 y.o. female with history of CAD with VF arrest s/p PCI to the RCA in 2017 s/p PTCA to the RCA for ISR in 2018, chronic combined systolic and diastolic CHF, COPD with ongoing tobacco use, HTN, HLD, anemia, and narcotic abuse on Suboxone therapy admitted with acute hypoxic respiratory failure requiring mechanical ventilation (extubated 09/17/2020) secondary to COPD exacerbation Klebsiella PNA and acute on chronic combined CHF with transient ST elevation and echo demonstrating new LV dysfunction.    PT Comments    MD cleared pt for participation in setting of low K+ (3.0). Pt received in bed & agreeable to tx. Pt requires MAX assist for supine>sit with HOB significantly elevated, MAX assist to scoot to sitting EOB. Pt is able to progress from max assist to CGA for sitting balance EOB. Pt completes sit<>stand x 2 & stand pivot to recliner with MAX assist +2 HHA with pt requiring max multimodal cuing but poor demo for upright posture. Pt requires assistance to safely sit on recliner seat & scoot back in chair. PT instructs pt on BLE strengthening exercises. Pt fatigued at end of session but making slow, steady progress towards functional goals. Pt would benefit from intense therapies at CIR level to maximize independence with functional mobility as pt was high level, driving, and working prior to admission to hospital.    Follow Up Recommendations  CIR     Equipment Recommendations  None recommended by PT    Recommendations for Other Services Rehab consult     Precautions / Restrictions Precautions Precautions: Fall Restrictions Weight Bearing Restrictions: No Other Position/Activity Restrictions: NG tube    Mobility  Bed Mobility Overal bed mobility: Needs Assistance Bed Mobility: Supine to Sit     Supine to sit: Max assist;HOB  elevated     General bed mobility comments: use of bed rails, max cuing for sequencing & assistance to upright trunk & scoot to EOB    Transfers Overall transfer level: Needs assistance Equipment used: 2 person hand held assist Transfers: Sit to/from Stand Sit to Stand: Max assist;+2 physical assistance;From elevated surface;+2 safety/equipment            Ambulation/Gait Ambulation/Gait assistance: Max assist;+2 physical assistance Gait Distance (Feet): 2 Feet Assistive device: 2 person hand held assist Gait Pattern/deviations: Decreased step length - left;Decreased step length - right;Decreased dorsiflexion - right;Decreased dorsiflexion - left;Decreased stride length;Trunk flexed Gait velocity: decreased   General Gait Details: a few steps to recliner on R   Stairs             Wheelchair Mobility    Modified Rankin (Stroke Patients Only)       Balance Overall balance assessment: Needs assistance Sitting-balance support: Feet unsupported;Bilateral upper extremity supported Sitting balance-Leahy Scale: Poor Sitting balance - Comments: pt initially requiring max assist for static sitting balance, able to progress to CGA     Standing balance-Leahy Scale: Zero Standing balance comment: max assist +2                            Cognition Arousal/Alertness: Lethargic Behavior During Therapy: Flat affect Overall Cognitive Status: Impaired/Different from baseline Area of Impairment: Attention;Memory;Safety/judgement;Awareness;Following commands;Problem solving                     Memory: Decreased short-term memory Following Commands: Follows  one step commands inconsistently;Follows one step commands with increased time Safety/Judgement: Decreased awareness of safety;Decreased awareness of deficits Awareness: Intellectual;Emergent;Anticipatory Problem Solving: Slow processing;Decreased initiation;Requires tactile cues;Requires verbal cues         Exercises General Exercises - Lower Extremity Ankle Circles/Pumps: AROM;Both;10 reps Heel Slides: AAROM;Strengthening;Both;10 reps Hip ABduction/ADduction: AAROM;Strengthening;Both;10 reps    General Comments General comments (skin integrity, edema, etc.): Pt on room air during session, SPO2 >90% throughout, max HR Of 181 bpm upon sitting EOB with nurse aware & cleared pt for continuation in therapy, provided rest breaks to allow HR to decrease to 120s      Pertinent Vitals/Pain Pain Assessment: Faces Faces Pain Scale: Hurts even more Pain Location: BLE pain, increases when touching BLE Pain Descriptors / Indicators: Grimacing Pain Intervention(s): Monitored during session;Patient requesting pain meds-RN notified;Repositioned    Home Living Family/patient expects to be discharged to:: Private residence Living Arrangements: Alone Available Help at Discharge: Family;Available 24 hours/day                Prior Function            PT Goals (current goals can now be found in the care plan section) Acute Rehab PT Goals Patient Stated Goal: get better PT Goal Formulation: With patient/family Time For Goal Achievement: 10/02/20 Potential to Achieve Goals: Fair Progress towards PT goals: Progressing toward goals    Frequency    7X/week      PT Plan Current plan remains appropriate    Co-evaluation              AM-PAC PT "6 Clicks" Mobility   Outcome Measure  Help needed turning from your back to your side while in a flat bed without using bedrails?: A Lot Help needed moving from lying on your back to sitting on the side of a flat bed without using bedrails?: Total Help needed moving to and from a bed to a chair (including a wheelchair)?: Total Help needed standing up from a chair using your arms (e.g., wheelchair or bedside chair)?: Total Help needed to walk in hospital room?: Total Help needed climbing 3-5 steps with a railing? : Total 6 Click  Score: 7    End of Session Equipment Utilized During Treatment: Gait belt Activity Tolerance: Patient tolerated treatment well;Patient limited by fatigue Patient left: in chair;with call bell/phone within reach Nurse Communication: Mobility status (HR) PT Visit Diagnosis: Difficulty in walking, not elsewhere classified (R26.2);Muscle weakness (generalized) (M62.81);Unsteadiness on feet (R26.81)     Time: 1610-9604 PT Time Calculation (min) (ACUTE ONLY): 23 min  Charges:  $Therapeutic Activity: 23-37 mins                     [name], PT, DPT 09/20/20, 9:33 AM    [name] 09/20/2020, 9:31 AM

## 2020-09-20 NOTE — Progress Notes (Addendum)
Great day. Passed swallow evaluation and progressed to pureed diet with nectar thick liquids. Ate at every some of every meal- fed by family or staff and took medications crushed in applesauce. More alert and interactive. Oriented except for time-no clock. B/P low due to Metoprolol but heart rate less than 100!!

## 2020-09-20 NOTE — Progress Notes (Addendum)
NAME:  Kathryn Garrett, MRN:  [id number], DOB:  06-Mar-1946, LOS: 81 ADMISSION DATE:  09/10/2020, CONSULTATION DATE: 09/11/2020 REFERRING MD: Dr. Damita Dunnings, CHIEF COMPLAINT: Shortness of breath  History of Present Illness:  75 year old female presented to the [facility] ED from home with complaints of shortness of breath.  Patient reported 2 to 3 days of progressive shortness of breath and associated new onset nonproductive cough as well as a subjective fever.  Per ED documentation she denied associated chest pain/palpitations/diaphoresis.  And denies any recent nausea/vomiting, chills/rigors/myalgias.  Patient confirmed a history of COPD and current smoking history stating she is not on any supplemental oxygen at baseline.  ED course: Patient received cefepime due to concerns for a left lower lobe infiltrate, 500 mL NS bolus & IV fluids at 150 mL an hour Initial vitals: Afebrile at 98, tachypneic at 22, tachycardic at 108, BP 127/72 & 99% on BiPAP Significant labs: Serum bicarb 15, hyperglycemic at 350, troponin 70 > 745 > 1838, BNP 222.6, VBG revealed metabolic acidosis: 2.45/80/998/33.8, leukocytosis at 40.2, lactic acidosis 2.5> 2.2 > 2.5  TRH hospitalist were consulted for admission.  Around midnight PCCM was called due to patient's deteriorating respiratory status.  Per ED provider Dr. Karma Greaser and care RN the patient complained that " I can't breathe", was given a dose of Ativan and became somnolent on BiPAP.  Coarse crackles auscultated bilaterally, and the decision was made to emergently intubate the patient and place her on mechanical ventilation. PCCM consulted for management  Significant Hospital Events: Including procedures, antibiotic start and stop dates in addition to other pertinent events   09/10/2020-patient admits admitted with hospitalist service for COPD exacerbation and left lower lobe pneumonia 09/11/2020-overnight patient had suspected flash pulmonary edema, dyspnea followed by  somnolence requiring emergent intubation and mechanical ventilation.  Admitted to ICU 09/12/20- patient failed SBT , she had blood tinged secretions fromETT and aggitation, family was at bedside and we agreed on giving her more time and try again.  09/14/20- Failed SBT (became hypoxic with sats mid 80's, increased WOB and assessory muscle use, HR 140-160's). Will diurese today and add Metoprolol.  Tracheal aspirate from 4/30 with KLEBSIELLA PNEUMONIAE (resistant to Ampicillin)   09/15/20- Failed SBT (increased WOB and assessory muscle use, RR 40's, HR 140-150's, Hypertensive); plan to add scheduled PO Klonopin and Oxycodone, Lasix 40 mg IV x1, consult Palliative Care 09/16/20- Worsening Leukocytosis up to 21.7, low grade fever overnight, increased secretions overnight, will repeat Tracheal aspirate, remove central line, add mucinex, plan for SBT 09/17/20-[name] passed SBT in AM and following commands still having some moderate secretions 09/18/20-  Extubated yesterday.  Failed swallow eval today.  09/20/20- Weaned to room air, Leukocytosis improving; Hemodynamically stable, PCCM will sign off  Interim History / Subjective:  No acute events reported overnight Afebrile, Hemodynamically stable, no Vasopressors Weaned to room air Urine output 900 cc over past 24 hrs (+1.3 L since admit), Creatinine remains stable with diuresis Leukocytosis continues to improve PCCM WILL SIGN OFF AT THIS TIME   Objective   Blood pressure (!) 102/59, pulse 96, temperature 98 F (36.7 C), temperature source Axillary, resp. rate 14, height 4\' 11"  (1.499 m), weight 61.1 kg, SpO2 92 %.        Intake/Output Summary (Last 24 hours) at 09/20/2020 0823 Last data filed at 09/20/2020 0600 Gross per 24 hour  Intake --  Output 901 ml  Net -901 ml   Filed Weights   09/17/20 0445 09/19/20 0500 09/20/20 0500  Weight:  66.4 kg 67 kg 61.1 kg    Examination: General: Acute on chronically ill-appearing female, laying in bed, breathing  treatment and chest PT via bed currently in place, in no acute distress HEENT: Atraumatic, normocephalic, neck supple, no JVD Neuro: Awake, alert and oriented x3, moves all extremities to command (generalized weakness), no focal deficits, speech clear CV: Tachycardia, regular rate and rhythm with occasional PACs, S1-S2, no murmurs, rubs, gallops, 2+ distal pulses Pulm: Coarse breath sounds bilaterally, no wheezing or rales noted, even, nonlabored GI: Soft, nontender, nondistended, no guarding or rebound tenderness, bowel sounds positive x4 Skin: Limited exam-warm and dry.  No obvious rashes, lesions, ulcerations Extremities: Warm and dry.  No deformities, minimal edema   Labs/imaging that I have personally reviewed  (right click and "Reselect all SmartList Selections" daily)  Labs 09/20/2020: WBC 20.1, K 3.0, glucose 124, BUN 68, Cr. 0.66 Chest x-ray 09/16/2020>>Endotracheal tube, NG tube, right IJ line in stable position. Heart size normal. Persistent but improved bilateral interstitial infiltrates. Tiny left pleural effusion cannot be excluded. No pneumothorax. MRI brain 09/13/2020:No acute brain finding. Mild chronic small-vessel ischemic change of the white matter, fairly typical for age.  Assessment & Plan:   Acute hypoxic respiratory failure secondary to Acute COPD Exacerbation & left lung multifocal pneumonia -S/p Extubation -Supplemental O2 as needed to maintain O2 sats 88 to 94% -Follow intermittent CXR & ABG as needed -Bronchodilators -Continue Budesonide nebs -Completed 7 days course of Cefepime/Ancef -Aggressive pulmonary toilet  Sepsis due to left lung multifocal pneumonia>> tracheal aspirate on 4/30 with KLEBSIELLA PNEUMONIAE (resistant to Ampicillin)>>treated -Monitor fever curve -Trend WBCs.  Elevated WBC in part related to steroids. -Follow cultures as above -Completed 7 days of Cefepime/Ancef  Acute systolic congestive heart failure Acute non-ST elevation MI New  diagnosis of A. fib with RVR -Continuous cardiac monitoring -Patient's ejection fraction on echocardiogram is 30% -Diuresis as renal function and BP permits >> currently on 40 mg IV Lasix daily and will continue -Cardiology following, input is appreciated -Milrinone has been tapered off -Patient completed therapy with IV heparin -Continue aspirin Plavix and statin -She will need left heart cath once stable -Metoprolol BID per tube  Acute metabolic/toxic encephalopathy>>improving -Improved now that she is extubated and off continuous sedation -stop scheduled Klonopin as unable to take p.o. -Was on buprenorphine/naloxone as an outpatient which is now held ~ continue scheduled Oxycodone per tube -Provide supportive care -CT Head at admission negative -MRI Brain 09/13/20 negative -Continue Aspirin, Plavix, Atorvastatin   Best practice (right click and "Reselect all SmartList Selections" daily)  Diet:  NPO.  Repeat swallow evaluation to be performed on 09/20/2020 Pain/Anxiety/Delirium protocol (if indicated): Oxycodone VAP protocol (if indicated): Now extubated. DVT prophylaxis: SCD (chemical prophylaxis held due to previous hemoptysis) GI prophylaxis: H2B Glucose control:  SSI Yes Central venous access: n/a Arterial line:  N/A Foley: N/A Mobility: Out of bed with assistance as tolerated PT consulted: needs Pt/OT Code Status:  full code Disposition: Stepdown   Labs   CBC: Recent Labs  Lab 09/14/20 0523 09/15/20 0552 09/16/20 0500 09/17/20 0406 09/18/20 0550 09/19/20 0418 09/20/20 0356  WBC 10.9*   < > 21.4* 28.8* 34.3* 28.5* 20.1*  NEUTROABS 8.1*  --  14.1*  --   --   --  12.2*  HGB 11.3*   < > 11.3* 12.3 13.5 14.3 14.6  HCT 36.5   < > 37.0 40.4 43.2 46.5* 48.3*  MCV 96.8   < > 97.9 98.1 97.1 97.3 99.6  PLT  215   < > 256 280 358 361 335   < > = values in this interval not displayed.    Basic Metabolic Panel: Recent Labs  Lab 09/16/20 0433 09/17/20 0004  09/17/20 0406 09/18/20 0550 09/19/20 0418 09/20/20 0356  NA 142 138 139 139 143 145  K 4.2 4.4 4.3 3.3* 5.3* 3.0*  CL 108 99 100 101 106 107  CO2 25 28 28 27 22 25   GLUCOSE 141* 122* 107* 92 89 124*  BUN 60* 65* 64* 52* 67* 68*  CREATININE 0.54 0.70 0.66 0.51 0.66 0.66  CALCIUM 8.6* 8.5* 8.9 9.0 9.3 9.1  MG 2.2 2.1 2.2 2.3 2.5* 2.7*  PHOS 3.8  --  4.5 3.5 4.4 4.2   GFR: Estimated Creatinine Clearance: 48.3 mL/min (by C-G formula based on SCr of 0.66 mg/dL). Recent Labs  Lab 09/17/20 0406 09/18/20 0550 09/19/20 0418 09/20/20 0356  WBC 28.8* 34.3* 28.5* 20.1*    Liver Function Tests: No results for input(s): AST, ALT, ALKPHOS, BILITOT, PROT, ALBUMIN in the last 168 hours. No results for input(s): LIPASE, AMYLASE in the last 168 hours. No results for input(s): AMMONIA in the last 168 hours.  ABG    Component Value Date/Time   PHART 7.38 09/15/2020 0500   PCO2ART 41 09/15/2020 0500   PO2ART 123 (H) 09/15/2020 0500   HCO3 33.5 (H) 09/17/2020 0944   TCO2 23 01/14/2009 0327   ACIDBASEDEF 0.8 09/15/2020 0500   O2SAT 88.8 09/17/2020 0944     Coagulation Profile: No results for input(s): INR, PROTIME in the last 168 hours.  Cardiac Enzymes: No results for input(s): CKTOTAL, CKMB, CKMBINDEX, TROPONINI in the last 168 hours.  HbA1C: Hgb A1c MFr Bld  Date/Time Value Ref Range Status  09/11/2020 04:37 AM 6.4 (H) 4.8 - 5.6 % Final    Comment:    (NOTE) Pre diabetes:          5.7%-6.4%  Diabetes:              >6.4%  Glycemic control for   <7.0% adults with diabetes   10/02/2019 12:28 PM 6.4 (H) 4.8 - 5.6 % Final    Comment:             Prediabetes: 5.7 - 6.4          Diabetes: >6.4          Glycemic control for adults with diabetes: <7.0     CBG: Recent Labs  Lab 09/19/20 1631 09/19/20 1948 09/19/20 2349 09/20/20 0403 09/20/20 0739  GLUCAP 127* 124* 117* 115* 132*     Darel Hong, AGACNP-BC [facility] Prefer epic messenger  for cross cover needs If after hours, please call E-link   PCCM ATTENDING ATTESTATION:  Patient seen and examined and relevant ancillary tests reviewed.   I agree with the assessment and plan of care as outlined by Darel Hong NP.  This patient was not seen as a shared visit. The following reflects my independent critical care time.  I  personally  reviewed database in its entirety and discussed care plan in detail. In addition, this patient was discussed on multidisciplinary rounds.   I agree with assessment and plan.    Corrin Parker, M.D.  [facility]  Medical Director [place] Director [facility] Cardio-Pulmonary Department

## 2020-09-20 NOTE — Progress Notes (Signed)
Pharmacy Electrolyte Monitoring Consult:  Labs:  Sodium (mmol/L)  Date Value  09/20/2020 145  10/02/2019 141  03/23/2013 138   Potassium (mmol/L)  Date Value  09/20/2020 3.0 (L)  03/23/2013 4.2   Magnesium (mg/dL)  Date Value  09/20/2020 2.7 (H)  03/20/2013 2.0   Phosphorus (mg/dL)  Date Value  09/20/2020 4.2   Calcium (mg/dL)  Date Value  09/20/2020 9.1   Calcium, Total (mg/dL)  Date Value  03/23/2013 9.0   Albumin (g/dL)  Date Value  09/11/2020 3.4 (L)  10/02/2019 4.2  03/19/2013 3.3 (L)    Assessment: Patient is a 75 y/o F with medical history including CAD s/p stenting, CHF, COPD who is admitted with COPD exacerbation / pneumonia / decompensated heart failure / NSTEMI. Pharmacy has been consulted to assist with electrolyte monitoring and replacement as indicated.   Vital High Protein @20mL /hr On Lasix 40 IV daily, Entresto 24/26mg  po bid  Goals of therapy: K 4-5.1 mmol/L Mg 2-2.4 mg/dL All other electrolytes within normal limits  Plan   K 3.0 - will replace with 75mEq Kcl packet x2  Follow-up electrolytes with AM labs tomorrow  Sherilyn Banker, PharmD Pharmacy Resident  09/20/2020 6:51 AM

## 2020-09-21 ENCOUNTER — Inpatient Hospital Stay: Payer: Medicare HMO

## 2020-09-21 DIAGNOSIS — J441 Chronic obstructive pulmonary disease with (acute) exacerbation: Secondary | ICD-10-CM | POA: Diagnosis not present

## 2020-09-21 DIAGNOSIS — I5020 Unspecified systolic (congestive) heart failure: Secondary | ICD-10-CM | POA: Diagnosis not present

## 2020-09-21 DIAGNOSIS — I214 Non-ST elevation (NSTEMI) myocardial infarction: Secondary | ICD-10-CM | POA: Diagnosis not present

## 2020-09-21 LAB — BLOOD GAS, ARTERIAL
Acid-base deficit: 10.8 mmol/L — ABNORMAL HIGH (ref 0.0–2.0)
Acid-base deficit: 0.8 mmol/L (ref 0.0–2.0)
Bicarbonate: 17.5 mmol/L — ABNORMAL LOW (ref 20.0–28.0)
Bicarbonate: 24.3 mmol/L (ref 20.0–28.0)
FIO2: 60
FIO2: 0.45
MECHVT: 450 mL
MECHVT: 500 mL
O2 Saturation: 93.8 %
O2 Saturation: 98.7 %
PEEP: 8 cmH2O
PEEP: 5 cmH2O
Patient temperature: 37
Patient temperature: 37
RATE: 16 resp/min
RATE: 18 resp/min
pCO2 arterial: 47 mmHg (ref 32.0–48.0)
pCO2 arterial: 41 mmHg (ref 32.0–48.0)
pH, Arterial: 7.18 — CL (ref 7.350–7.450)
pH, Arterial: 7.38 (ref 7.350–7.450)
pO2, Arterial: 87 mmHg (ref 83.0–108.0)
pO2, Arterial: 123 mmHg — ABNORMAL HIGH (ref 83.0–108.0)

## 2020-09-21 LAB — CBC WITH DIFFERENTIAL/PLATELET
Abs Immature Granulocytes: 0.2 10*3/uL — ABNORMAL HIGH (ref 0.00–0.07)
Basophils Absolute: 0.1 10*3/uL (ref 0.0–0.1)
Basophils Relative: 0 %
Eosinophils Absolute: 0 10*3/uL (ref 0.0–0.5)
Eosinophils Relative: 0 %
HCT: 48.7 % — ABNORMAL HIGH (ref 36.0–46.0)
Hemoglobin: 14.6 g/dL (ref 12.0–15.0)
Immature Granulocytes: 1 %
Lymphocytes Relative: 15 %
Lymphs Abs: 4.8 10*3/uL — ABNORMAL HIGH (ref 0.7–4.0)
MCH: 30 pg (ref 26.0–34.0)
MCHC: 30 g/dL (ref 30.0–36.0)
MCV: 100.2 fL — ABNORMAL HIGH (ref 80.0–100.0)
Monocytes Absolute: 2 10*3/uL — ABNORMAL HIGH (ref 0.1–1.0)
Monocytes Relative: 6 %
Neutro Abs: 24.6 10*3/uL — ABNORMAL HIGH (ref 1.7–7.7)
Neutrophils Relative %: 78 %
Platelets: 331 10*3/uL (ref 150–400)
RBC: 4.86 MIL/uL (ref 3.87–5.11)
RDW: 16.2 % — ABNORMAL HIGH (ref 11.5–15.5)
Smear Review: NORMAL
WBC: 31.7 10*3/uL — ABNORMAL HIGH (ref 4.0–10.5)
nRBC: 0 % (ref 0.0–0.2)

## 2020-09-21 LAB — BASIC METABOLIC PANEL
Anion gap: 10 (ref 5–15)
BUN: 73 mg/dL — ABNORMAL HIGH (ref 8–23)
CO2: 25 mmol/L (ref 22–32)
Calcium: 9.4 mg/dL (ref 8.9–10.3)
Chloride: 112 mmol/L — ABNORMAL HIGH (ref 98–111)
Creatinine, Ser: 0.65 mg/dL (ref 0.44–1.00)
GFR, Estimated: >60 mL/min (ref >60)
Glucose, Bld: 127 mg/dL — ABNORMAL HIGH (ref 70–99)
Potassium: 3.7 mmol/L (ref 3.5–5.1)
Sodium: 147 mmol/L — ABNORMAL HIGH (ref 135–145)

## 2020-09-21 LAB — GLUCOSE, CAPILLARY
Glucose-Capillary: 110 mg/dL — ABNORMAL HIGH (ref 70–99)
Glucose-Capillary: 127 mg/dL — ABNORMAL HIGH (ref 70–99)
Glucose-Capillary: 128 mg/dL — ABNORMAL HIGH (ref 70–99)
Glucose-Capillary: 146 mg/dL — ABNORMAL HIGH (ref 70–99)
Glucose-Capillary: 162 mg/dL — ABNORMAL HIGH (ref 70–99)
Glucose-Capillary: 98 mg/dL (ref 70–99)

## 2020-09-21 LAB — URINALYSIS, COMPLETE (UACMP) WITH MICROSCOPIC
Bilirubin Urine: NEGATIVE
Glucose, UA: NEGATIVE mg/dL
Hgb urine dipstick: NEGATIVE
Ketones, ur: NEGATIVE mg/dL
Nitrite: NEGATIVE
Protein, ur: 30 mg/dL — AB
Specific Gravity, Urine: 1.024 (ref 1.005–1.030)
pH: 6 (ref 5.0–8.0)

## 2020-09-21 LAB — MAGNESIUM: Magnesium: 2.5 mg/dL — ABNORMAL HIGH (ref 1.7–2.4)

## 2020-09-21 LAB — PHOSPHORUS: Phosphorus: 3.6 mg/dL (ref 2.5–4.6)

## 2020-09-21 IMAGING — DX DG ABDOMEN 1V
2 series · 2 of 2 positions shown · non-contrast
Comparison: [DATE].

CLINICAL DATA: Pain

EXAM:
ABDOMEN - 1 VIEW

[abdomen supine (1 of 2)]
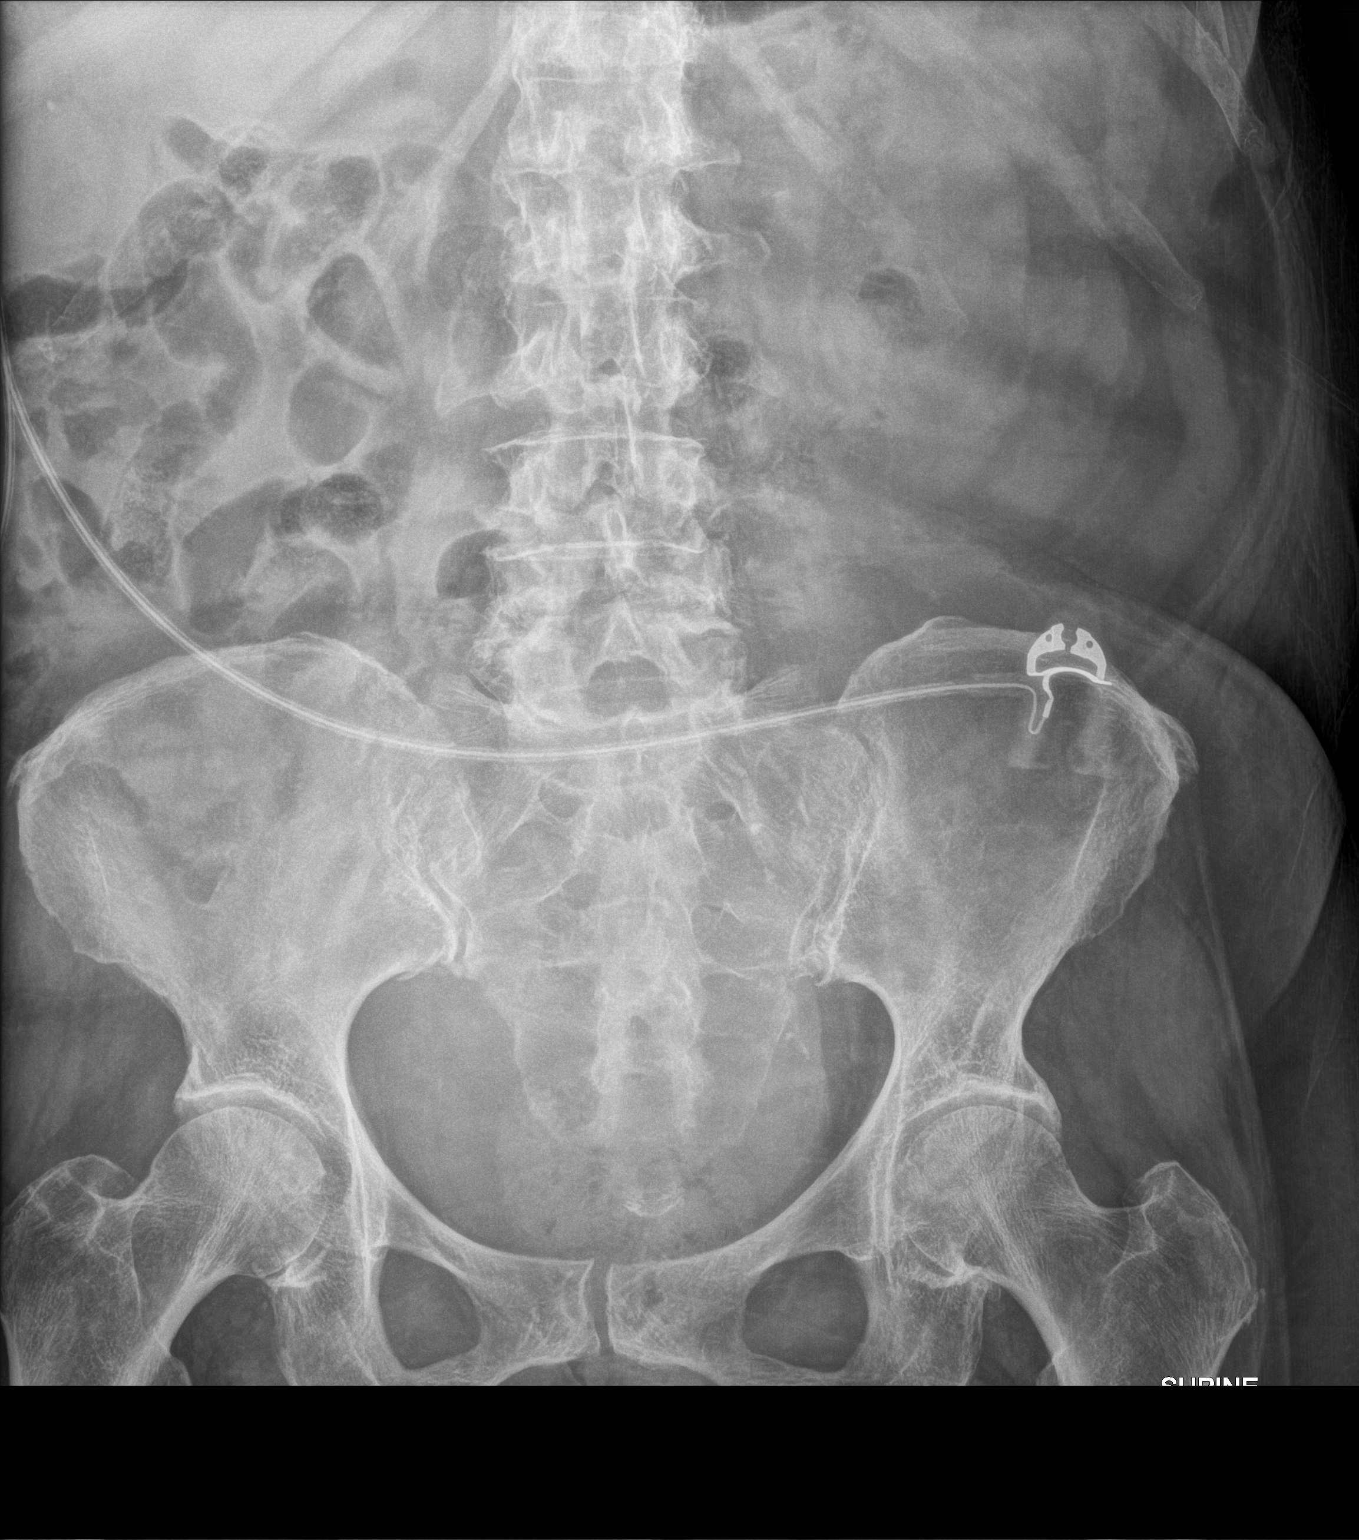

[abdomen supine (2 of 2)]
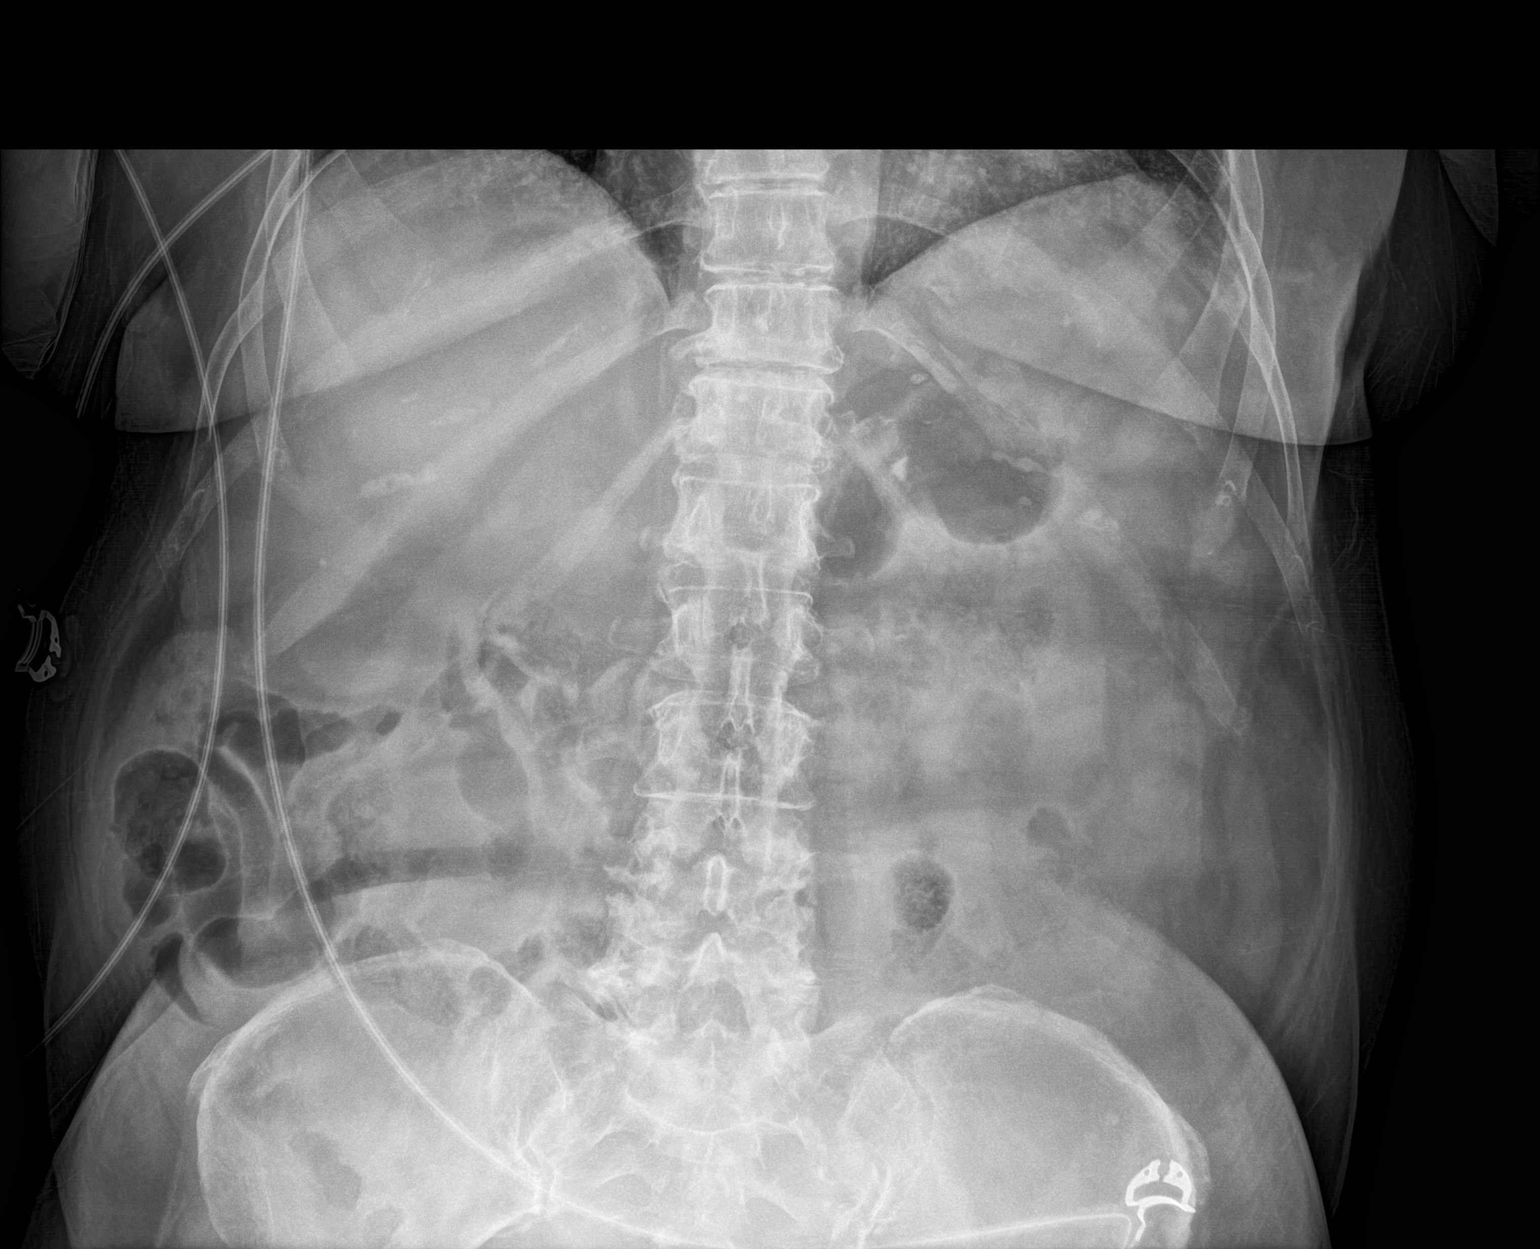

[2 of 2 positions shown; findings below may reference images not displayed]

FINDINGS: Feeding tube no longer present. There is moderate stool in the
colon. There is no bowel dilatation or air-fluid level to suggest
bowel obstruction. No evident free air.
IMPRESSION: Moderate stool in colon. No evident bowel obstruction or free air on
supine examination.

## 2020-09-21 IMAGING — DX DG CHEST 1V PORT
1 series · 1 of 1 positions shown · non-contrast
Comparison: [DATE]

CLINICAL DATA: Shortness of breath

EXAM:
PORTABLE CHEST 1 VIEW

[chest ap]
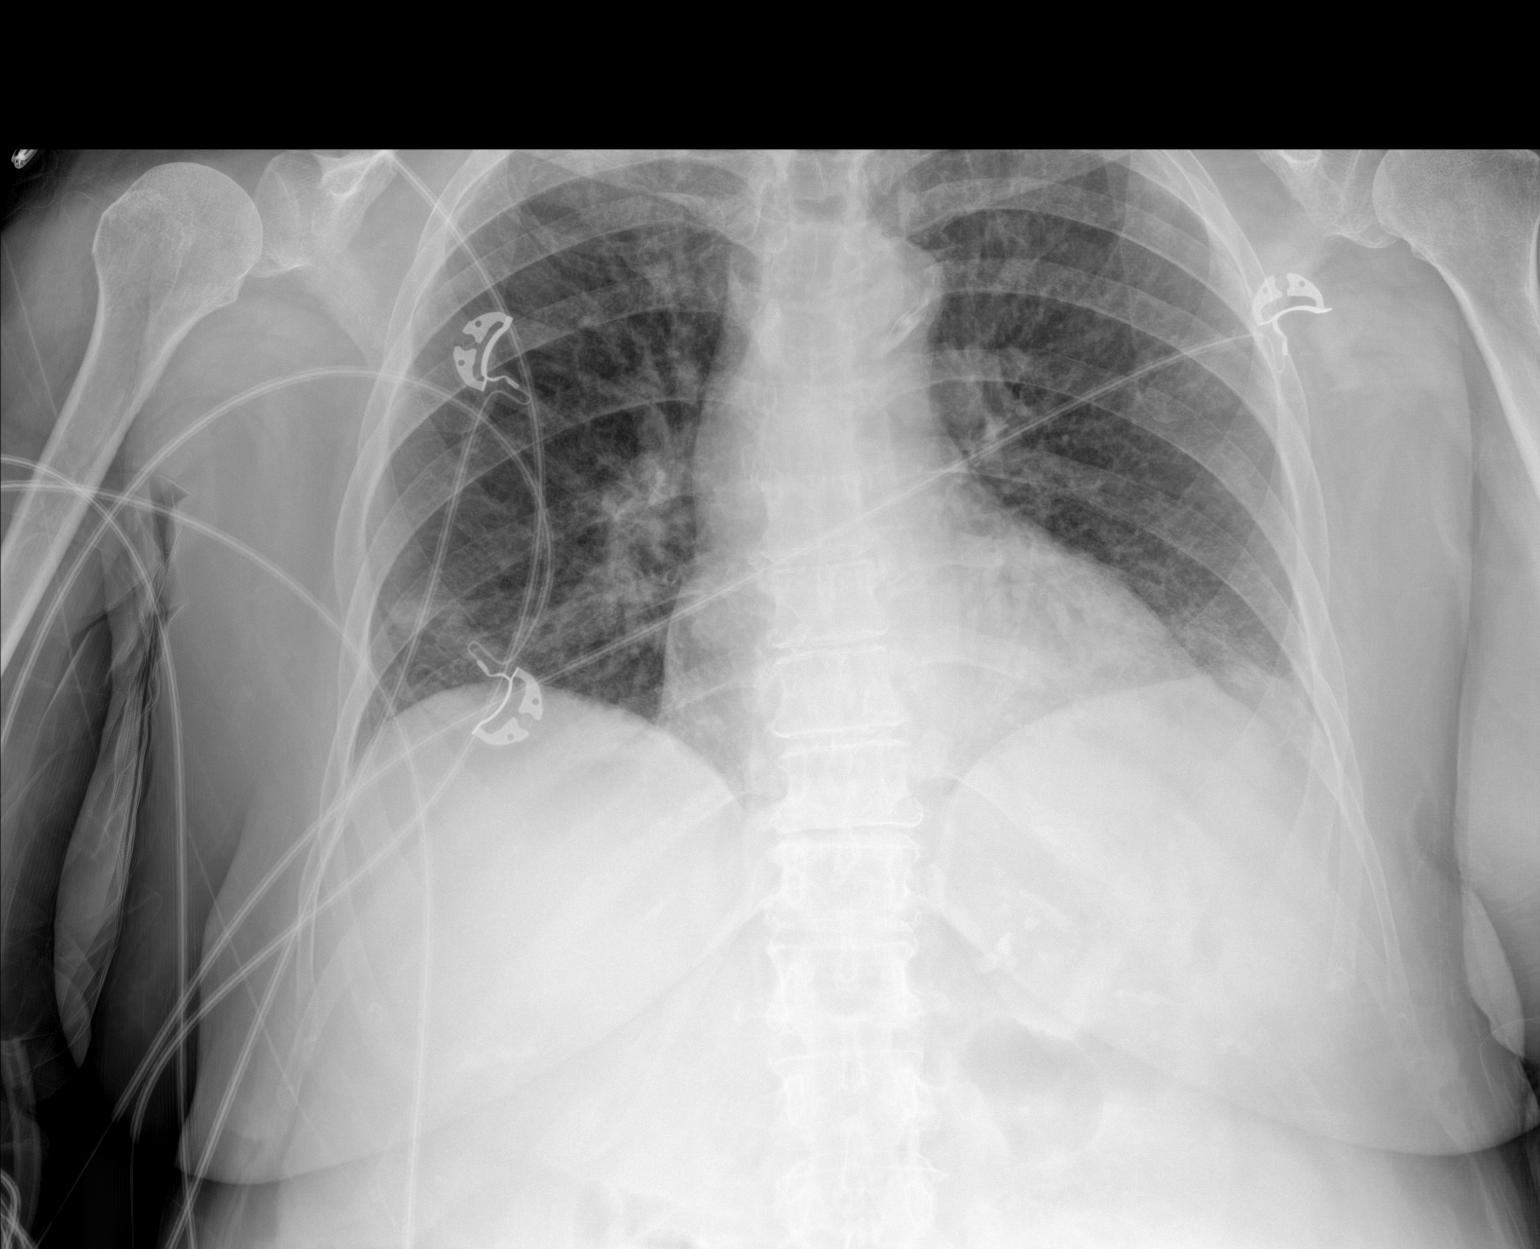

[1 of 1 positions shown; findings below may reference images not displayed]

FINDINGS: Endotracheal tube and nasogastric tube have been removed. No
pneumothorax. There is atelectatic change in the lung bases. The
lungs otherwise are clear. Heart size and pulmonary vascularity are
normal. No adenopathy. There is aortic atherosclerosis. No bone
lesions.
IMPRESSION: No pneumothorax. Bibasilar atelectasis. There may be subtle
interspersed infiltrate in these areas in the bases. Lungs otherwise
clear.

Heart size normal.  Aortic Atherosclerosis ([KJ]-[KJ]).

## 2020-09-21 MED ORDER — IBUPROFEN 400 MG PO TABS
400.0000 mg | ORAL_TABLET | Freq: Four times a day (QID) | ORAL | Status: DC | PRN
  Administered 2020-09-21: 400 mg via ORAL
  Filled 2020-09-21: qty 1

## 2020-09-21 MED ORDER — POTASSIUM CHLORIDE CRYS ER 20 MEQ PO TBCR
40.0000 meq | EXTENDED_RELEASE_TABLET | ORAL | Status: AC
  Administered 2020-09-21 (×2): 40 meq via ORAL
  Filled 2020-09-21 (×2): qty 2

## 2020-09-21 MED ORDER — ROSUVASTATIN CALCIUM 10 MG PO TABS
20.0000 mg | ORAL_TABLET | Freq: Every day | ORAL | Status: DC
  Administered 2020-09-21 – 2020-09-25 (×5): 20 mg via ORAL
  Filled 2020-09-21 (×5): qty 2

## 2020-09-21 MED ORDER — BUPRENORPHINE HCL-NALOXONE HCL 2-0.5 MG SL SUBL
2.0000 | SUBLINGUAL_TABLET | Freq: Every day | SUBLINGUAL | Status: DC
  Administered 2020-09-21 – 2020-10-13 (×22): 2 via SUBLINGUAL
  Filled 2020-09-21 (×8): qty 2
  Filled 2020-09-21: qty 1
  Filled 2020-09-21 (×9): qty 2
  Filled 2020-09-21: qty 1
  Filled 2020-09-21 (×4): qty 2
  Filled 2020-09-21: qty 1

## 2020-09-21 NOTE — Progress Notes (Signed)
Progress Note  Patient Name: Kathryn Garrett Date of Encounter: 09/21/2020  Primary Cardiologist: Larae Grooms, MD  Subjective   Very thirsty. Cleared for thickened liquids, but she doesn't like the taste.  No chest pain.  Breathing stable.  Inpatient Medications    Scheduled Meds: . aspirin  81 mg Oral QHS  . budesonide (PULMICORT) nebulizer solution  0.25 mg Nebulization BID  . Chlorhexidine Gluconate Cloth  6 each Topical QHS  . clopidogrel  75 mg Oral QHS  . feeding supplement (NEPRO CARB STEADY)  237 mL Oral TID BM  . fluticasone  1 spray Each Nare Daily  . furosemide  20 mg Intravenous Daily  . insulin aspart  0-15 Units Subcutaneous Q4H  . ipratropium-albuterol  3 mL Nebulization TID  . levothyroxine  88 mcg Oral Q0600  . loratadine  10 mg Oral Daily  . mouth rinse  15 mL Mouth Rinse BID  . metoprolol tartrate  25 mg Oral BID  . multivitamin with minerals  1 tablet Oral Daily  . oxyCODONE  7.5 mg Oral Q6H  . pantoprazole  40 mg Oral Daily  . potassium chloride  40 mEq Oral Q4H  . predniSONE  20 mg Oral Q breakfast  . sacubitril-valsartan  1 tablet Oral BID   Continuous Infusions: . sodium chloride 10 mL/hr at 09/17/20 0700   PRN Meds: acetaminophen **OR** acetaminophen, albuterol, artificial tears, docusate, guaiFENesin, metoprolol tartrate, morphine injection, polyethylene glycol   Vital Signs    Vitals:   09/21/20 0400 09/21/20 0500 09/21/20 0600 09/21/20 0800  BP: 128/61 128/63 (!) 120/54   Pulse: 100 100 (!) 105   Resp: (!) 23 (!) 24 19   Temp: 98.5 F (36.9 C)   98.9 F (37.2 C)  TempSrc: Axillary   Axillary  SpO2: 96% 96% 94%   Weight:  59.7 kg    Height:        Intake/Output Summary (Last 24 hours) at 09/21/2020 0836 Last data filed at 09/21/2020 0600 Gross per 24 hour  Intake 1077 ml  Output 700 ml  Net 377 ml   Filed Weights   09/19/20 0500 09/20/20 0500 09/21/20 0500  Weight: 67 kg 61.1 kg 59.7 kg    Physical Exam   GEN:  Well nourished, well developed, in no acute distress.  HEENT: Grossly normal.  Neck: Supple, no JVD, carotid bruits, or masses. Cardiac: RRR, tachycardic, no murmurs, rubs, or gallops. No clubbing, cyanosis, edema.  Radials 2+, DP/PT 1+ and equal bilaterally.  Respiratory:  Respirations regular and unlabored, scattered rhonchi. Bibasilar crackles. GI: Soft, nontender, nondistended, BS + x 4. MS: no deformity or atrophy. Skin: warm and dry, no rash. Neuro:  Strength and sensation are intact. Psych: AAOx3.  Normal affect.  Labs    Chemistry Recent Labs  Lab 09/19/20 0418 09/20/20 0356 09/21/20 0206  NA 143 145 147*  K 5.3* 3.0* 3.7  CL 106 107 112*  CO2 22 25 25   GLUCOSE 89 124* 127*  BUN 67* 68* 73*  CREATININE 0.66 0.66 0.65  CALCIUM 9.3 9.1 9.4  GFRNONAA >60 >60 >60  ANIONGAP 15 13 10      Hematology Recent Labs  Lab 09/19/20 0418 09/20/20 0356 09/21/20 0206  WBC 28.5* 20.1* 31.7*  RBC 4.78 4.85 4.86  HGB 14.3 14.6 14.6  HCT 46.5* 48.3* 48.7*  MCV 97.3 99.6 100.2*  MCH 29.9 30.1 30.0  MCHC 30.8 30.2 30.0  RDW 15.5 15.9* 16.2*  PLT 361 335 331  Cardiac Enzymes  Recent Labs  Lab 09/10/20 2050 09/11/20 0437 09/11/20 1433 09/11/20 1611 09/11/20 2011  TROPONINIHS 1,838* 2,434* 2,265* 2,455* 2,317*      Lipids  Lab Results  Component Value Date   CHOL 107 10/02/2019   HDL 41 10/02/2019   LDLCALC 43 10/02/2019   TRIG 183 (H) 09/15/2020   CHOLHDL 2.6 10/02/2019    HbA1c  Lab Results  Component Value Date   HGBA1C 6.4 (H) 09/11/2020    Radiology    DG Abd 1 View  Result Date: 09/19/2020 CLINICAL DATA:  Nasogastric tube placement. EXAM: ABDOMEN - 1 VIEW COMPARISON:  One view chest 09/17/2020 FINDINGS: A feeding tube projects over the right upper quadrant of the abdomen consistent with tip in the distal stomach or proximal duodenum. The previously demonstrated nasogastric tube has been removed. The visualized bowel gas pattern is normal. Mild  lumbar spine degenerative changes are present. IMPRESSION: Feeding tube projects to the right upper quadrant of the abdomen consistent with position in the distal stomach or proximal duodenum. Electronically Signed   By: Richardean Sale M.D.   On: 09/19/2020 12:12   DG Chest Port 1 View  Result Date: 09/17/2020 CLINICAL DATA:  Hypoxia, increased respiratory distress, history coronary artery disease post MI, COPD, hypertension, former smoker EXAM: PORTABLE CHEST 1 VIEW COMPARISON:  Portable exam 0922 hours compared to 09/16/2020 FINDINGS: Tip of endotracheal tube projects 3.0 cm above carina. Nasogastric tube coiled in proximal stomach. Upper normal heart size with slight pulmonary vascular congestion. Mediastinal contours normal. Atherosclerotic calcification aorta. Lungs clear. No definite infiltrate, pleural effusion, or pneumothorax. IMPRESSION: No acute abnormalities. Aortic Atherosclerosis (ICD10-I70.0). Electronically Signed   By: Lavonia Dana M.D.   On: 09/17/2020 12:36    Telemetry    RSR to sinus tachycardia - low 100's this AM.  PACs. Brief run of SVT vs aflutter this AM @ 0103 (8 beats) - Personally Reviewed  Cardiac Studies   2D echo 09/11/2020: 1. Recommend limited echo with iv contrast agent (definity) for addequate  assessment of LVEF and wall motion.. Left ventricular ejection fraction,  by estimation, is 25 to 30%. The left ventricle has severely decreased  function. Left ventricular  endocardial border not optimally defined to evaluate regional wall motion.  Left ventricular diastolic parameters are indeterminate.  2. Right ventricular systolic function is normal. The right ventricular  size is normal.  3. The mitral valve was not well visualized. Mild mitral valve  regurgitation.  4. The aortic valve was not well visualized. Aortic valve regurgitation  is not visualized.  __________  LHC 05/2016:  Ost RCA lesion, 30 %stenosed.  LM lesion, 30 %stenosed.  Ost LAD  lesion, 50 %stenosed - looks similar to prior catheterization. FFR 0.87  The left ventricular systolic function is normal.  LV end diastolic pressure is normal.  The left ventricular ejection fraction is 55-65% by visual estimate.  There is mild (2+) mitral regurgitation.  _______CULPRIT LESION_____________  Prox RCA Overlapped BMS-DES stents, 99 % -n-stent re-stenosed.  PTCA with 3.0 mm [place]oring Balloon with Post-dilation using 3.5 mm [place] Balloon was perfromed. Post intervention, there is a 0% residual stenosis.  Recurrent in-stent restenosis of the overlapped stent (BMS and DES) in proximal RCA treated with aggressive angioplasty. Repeat FFR on ostial LAD confirms this is not likely significant.   Plan:  She'll be transferred to 6 [facility] post procedure unit for sheath removal and post PCI care.  Continue aggressive risk factor modification  Continue aspirin and  Plavix lifelong  Smoking cessation counseling is mandatory to maintain stent patency __________  LHC 01/2016:  Ost LAD to Prox LAD lesion, 40 %stenosed. Similar to prior cath.  The left ventricular systolic function is normal.  LV end diastolic pressure is normal.  The left ventricular ejection fraction is 55-65% by visual estimate.  There is no aortic valve stenosis.  Prox RCA lesion, 75 %stenosed, in-stent restenosis.  Post intervention with 3.0 x 10 Angiosculpt inflated to 3.5 mm, there is a 0% residual stenosis.  Continue aggressive secondary prevention. Clopidogrel restarted. _________  2D echo 09/2015: - Left ventricle: The cavity size was normal. Systolic function was  normal. The estimated ejection fraction was in the range of 60%  to 65%. Wall motion was normal; there were no regional wall  motion abnormalities. Doppler parameters are consistent with  abnormal left ventricular relaxation (grade 1 diastolic  dysfunction).  - Right ventricle: Systolic function was normal.  -  Pulmonary arteries: Systolic pressure was within the normal  range.   Impressions:   - Normal study.  Patient Profile     75 y.o.femalewith history of CAD with VF arrest s/p PCI to the RCA in 2017 s/p PTCA to the RCA for ISR in 2018, chronic combined systolic and diastolic CHF, COPD with ongoing tobacco use, HTN, HLD, anemia, and narcotic abuse on Suboxone therapy admitted with acute hypoxic respiratory failure requiring mechanical ventilation secondary to COPD exacerbation Klebsiella PNA and acute on chronic combined CHF with transient ST elevation with subsequent rise on HS-Tn to 2455 and echo demonstrating new LV dysfunction requiring augmentation with milrinone for diuresis. Extubated 5/6.  Assessment & Plan    1.  Acute on chronic combined systolic and diastolic CHF:  Admitted April 29 with acute respiratory failure, multifactorial pneumonia, COPD exacerbation, and volume overload.  Briefly required milrinone for augmentation of IV diuresis this was discontinued May 2.  Extubated May 6. EF 25 to 30% by echo this admission.  I's and O's indicate +377 overnight and + 116 for admission.  Wt recorded down @ 59.7kg, which is below prior dry wt.  Lasix reduced to 20 IV daily on 5/9.  Creat stable.  BUN higher this AM @ 73.  Bicarb stable.  Was using lasix 40 daily prn @ home previously.  With rise in BUN, sinus tach, and extreme thirst, will hold lasix today.  BP remains soft. Will cont  blocker and entresto.  No room for MRA @ this point. Will plan to transition to Toprol XL prior to d/c.  Plan for cath given LV dysfxn/NSTEMI, once clinically stable.  2.  CAD/NSTEMI:  Patient has no recollection of symptoms prior to admission but denies any chest pain or dyspnea currently.  As above, EF 25 to 30% by echo this admission, which is down from prior readings.  She remains on aspirin, Plavix, beta-blocker, and statin therapy and we will plan on diagnostic catheterization, once clinically stable.  3.   Acute resp failure w/ hypoxia/multifactoral pneumonia/COPD exacerbation: Extubated May 6.  Management per internal medicine.  4.  PAF/SVT:  Cont to have brief episodes of SVT vs flutter.  8 beats overnight.  Cont  blocker.  5.  HL: cont statin.  6.  Hypokalemia:  3.7 this AM.  Signed, Murray Hodgkins, NP  09/21/2020, 8:36 AM    For questions or updates, please contact   Please consult [email or web address] for contact info under Cardiology/STEMI.

## 2020-09-21 NOTE — Progress Notes (Signed)
Physical Therapy Treatment Patient Details Name: Kathryn Garrett MRN: [id number] DOB: 23-Apr-1946 Today's Date: 09/21/2020    History of Present Illness 75 y.o. female with history of CAD with VF arrest s/p PCI to the RCA in 2017 s/p PTCA to the RCA for ISR in 2018, chronic combined systolic and diastolic CHF, COPD with ongoing tobacco use, HTN, HLD, anemia, and narcotic abuse on Suboxone therapy admitted with acute hypoxic respiratory failure requiring mechanical ventilation (extubated 09/17/2020) secondary to COPD exacerbation Klebsiella PNA and acute on chronic combined CHF with transient ST elevation and echo demonstrating new LV dysfunction.    PT Comments    Pt is making gradual progress towards goals with ability to use RW for mobility efforts. Still requires +2 assist from RN and this Probation officer for mobility efforts. HR at 97 at rest increasing to 137bpm with exertion. All mobility performed on 2L of O2 with sats WNL with exertion. Able to follow commands for there-ex, however inconsistent with ability to follow complex commands. Agreeable to sit in recliner, needs +2 assist. Will continue to progress. Good family support in hospital room.   Follow Up Recommendations  CIR     Equipment Recommendations   (TBD in next venue)    Recommendations for Other Services Rehab consult     Precautions / Restrictions Precautions Precautions: Fall Restrictions Weight Bearing Restrictions: No    Mobility  Bed Mobility Overal bed mobility: Needs Assistance Bed Mobility: Supine to Sit     Supine to sit: Mod assist     General bed mobility comments: needs use of hand rail. Half way through movement, rest break required with pt reporting "all my energy is used". Pt then able to complete task and sit at EOB. Slight dizziness, however dissapates quickly.    Transfers Overall transfer level: Needs assistance Equipment used: Rolling walker (2 wheeled) Transfers: Sit to/from Stand Sit to  Stand: Max assist         General transfer comment: able to stand with +2 assist. Fair balance. Cues for correct use of RW.  Ambulation/Gait Ambulation/Gait assistance: Mod assist;+2 physical assistance Gait Distance (Feet): 5 Feet Assistive device: Rolling walker (2 wheeled) Gait Pattern/deviations: Step-to pattern;Shuffle     General Gait Details: able to take short shuffle steps over to recliner. Needs heavy cues for correct use of RW and tends to stay too far away. +2 for safety. Heavy cues for sequencing.   Stairs             Wheelchair Mobility    Modified Rankin (Stroke Patients Only)       Balance Overall balance assessment: Needs assistance Sitting-balance support: Feet unsupported;Bilateral upper extremity supported Sitting balance-Leahy Scale: Fair Sitting balance - Comments: able to demonstrate static sitting with constant supervision   Standing balance support: Bilateral upper extremity supported Standing balance-Leahy Scale: Poor Standing balance comment: needs assist for RW use                            Cognition Arousal/Alertness: Awake/alert Behavior During Therapy: Impulsive Overall Cognitive Status: Impaired/Different from baseline                                 General Comments: impulsive and impaired safety awareness. Inconsistently follows 1 step commands      Exercises Other Exercises Other Exercises: supine ther-ex performed on B LE including quad sets, SLRs, hip  abd/add, hip add squeezes, and SAQ. All ther-ex performed x 10 reps with cues for attention to task. Easily distracted.    General Comments        Pertinent Vitals/Pain Pain Assessment: No/denies pain    Home Living                      Prior Function            PT Goals (current goals can now be found in the care plan section) Acute Rehab PT Goals Patient Stated Goal: get better PT Goal Formulation: With patient Time For Goal  Achievement: 10/02/20 Potential to Achieve Goals: Fair Progress towards PT goals: Progressing toward goals    Frequency    7X/week      PT Plan Current plan remains appropriate    Co-evaluation              AM-PAC PT "6 Clicks" Mobility   Outcome Measure  Help needed turning from your back to your side while in a flat bed without using bedrails?: A Lot Help needed moving from lying on your back to sitting on the side of a flat bed without using bedrails?: A Lot Help needed moving to and from a bed to a chair (including a wheelchair)?: A Lot Help needed standing up from a chair using your arms (e.g., wheelchair or bedside chair)?: A Lot Help needed to walk in hospital room?: Total Help needed climbing 3-5 steps with a railing? : Total 6 Click Score: 10    End of Session Equipment Utilized During Treatment: Gait belt;Oxygen Activity Tolerance: Patient tolerated treatment well Patient left: in chair (left with MD in room) Nurse Communication: Mobility status PT Visit Diagnosis: Difficulty in walking, not elsewhere classified (R26.2);Muscle weakness (generalized) (M62.81);Unsteadiness on feet (R26.81)     Time: 8466-5993 PT Time Calculation (min) (ACUTE ONLY): 24 min  Charges:  $Gait Training: 8-22 mins $Therapeutic Exercise: 8-22 mins                     Kathryn Garrett, PT, DPT [phone]    Kathryn Garrett 09/21/2020, 12:58 PM

## 2020-09-21 NOTE — Progress Notes (Signed)
PROGRESS NOTE    Kathryn Garrett  [id number] DOB: 1946/02/22 DOA: 09/10/2020 PCP: Imagene Riches, NP  Brief Narrative:  75 year old female presented to the [facility] ED from home with complaints of shortness of breath.  Patient reported 2 to 3 days of progressive shortness of breath and associated new onset nonproductive cough as well as a subjective fever.  Per ED documentation she denied associated chest pain/palpitations/diaphoresis.  And denies any recent nausea/vomiting, chills/rigors/myalgias.  Patient confirmed a history of COPD and current smoking history stating she is not on any supplemental oxygen at baseline.  [facility] hospitalist were consulted for admission.  Around midnight PCCM was called due to patient's deteriorating respiratory status.  Per ED provider Dr. Karma Greaser and care RN the patient complained that " I can't breathe", was given a dose of Ativan and became somnolent on BiPAP.  Coarse crackles auscultated bilaterally, and the decision was made to emergently intubate the patient and place her on mechanical ventilation. PCCM consulted for management   09/10/2020-patient admits admitted with hospitalist service for COPD exacerbation and left lower lobe pneumonia  09/11/2020-overnight patient had suspected flash pulmonary edema, dyspnea followed by somnolence requiring emergent intubation and mechanical ventilation.  Admitted to ICU  09/12/20- patient failed SBT , she had blood tinged secretions fromETT and aggitation, family was at bedside and we agreed on giving her more time and try again.   09/14/20- Failed SBT (became hypoxic with sats mid 80's, increased WOB and assessory muscle use, HR 140-160's). Will diurese today and add Metoprolol.  Tracheal aspirate from 4/30 with KLEBSIELLA PNEUMONIAE (resistant to Ampicillin)    09/15/20- Failed SBT (increased WOB and assessory muscle use, RR 40's, HR 140-150's, Hypertensive); plan to add scheduled PO Klonopin and Oxycodone, Lasix 40 mg IV  x1, consult Palliative Care  09/16/20- Worsening Leukocytosis up to 21.7, low grade fever overnight, increased secretions overnight, will repeat Tracheal aspirate, remove central line, add mucinex, plan for SBT  09/17/20-[name] passed SBT in AM and following commands still having some moderate secretions  09/18/20-  Extubated yesterday.  Failed swallow eval today.  5/8: Patient transferred to [facility] service after successful extubation.  Leukocytosis improving.  Remains markedly encephalopathic.  Unable to provide history. 5/9: NG tube in place.  Patient felt worse this morning however her status is improved over the course of the day.  Dobbhoff tube discontinued and diet advanced.  Patient tolerating p.o. intake and medications without issue. 5/10: NG tube removed.  Patient tolerating p.o. intake and medications without issue.  Increasing white blood cell count this morning.    Assessment & Plan:   Principal Problem:   Acute exacerbation of chronic obstructive pulmonary disease (COPD) ([place]) Active Problems:   CAP (community acquired pneumonia)   HTN (hypertension)   Tobacco abuse   Hyperlipidemia LDL goal <70   Severe sepsis (HCC)   NSTEMI (non-ST elevated myocardial infarction) (HCC)   Hyperglycemia   SOB (shortness of breath)   Acute respiratory failure (HCC)   HFrEF (heart failure with reduced ejection fraction) ([place])  Acute hypoxic respiratory failure secondary to Acute COPD Exacerbation & left lung multifocal pneumonia Extubated 5/7 Saturating well on 2 L Completed course of antibiotics Continue to provide supportive care  Sepsis due to left lung multifocal pneumonia>> tracheal aspirate on 4/30 with KLEBSIELLA PNEUMONIAE (resistant to Ampicillin) -Completed 7 days of antibiotics. -White blood cell counts increased to 31.7 on 0000000, neutrophilic predominance Plan: Check UA Check chest x-ray Check KUB Monitor vitals and fever curve Hold antibiotics  for now.  Low threshold to  start if patient develops any clinical indications of infection  Acute systolic congestive heart failure Acute non-ST elevation MI New diagnosis of A. fib with RVR -Patient's ejection fraction on echocardiogram is 30% -Was previously on cardiac inotrope, weaned off -Completed course of IV heparin -Cardiology following Plan: Continue dual antiplatelet therapy.  Aspirin and Plavix Continue Lasix 40 mg IV daily Metoprolol 25 mg p.o. twice daily when Dobbhoff tube placed Entresto 24/26 twice daily when Dobbhoff placed Escalate goal-directed medical therapy as able Continue daily weights and strict I's and O's Cardiology following, recommendations appreciated Will need cardiac catheterization during this admission  Acute metabolic/toxic encephalopathy Patient off continuous sedation Still markedly encephalopathic Was on buprenorphine/naloxone as outpatient.  Currently held Scheduled Klonopin also held MRI brain negative on 09/13/2020 Plan: Avoid sedatives Frequent neurochecks Frequent reorientation Keep stepdown status for today Can likely downgrade tomorrow Therapy evaluations when able  DVT prophylaxis: SCDs Code Status: Full Family Communication: Daughter Amy [phone] on 5/8.  Left VM on 5/10 Disposition Plan: Status is: Inpatient  Remains inpatient appropriate because:Inpatient level of care appropriate due to severity of illness   Dispo: The patient is from: Home              Anticipated d/c is to: SNF              Patient currently is not medically stable to d/c.   Difficult to place patient No  Resolving respiratory failure.  Stable on 2 L.  Persistent encephalopathy.  Suspect ICU/hospital-acquired delirium.  Currently stepdown status.  Persistently elevated white blood cell count.  Cardiology planning for cardiac catheterization sometime during this admission.     Level of care: Stepdown  Consultants:   Cardiology   Procedures: Endotracheal  intubation  Antimicrobials:  None   Subjective: Patient seen and examined.  Confused this morning.  Objective: Vitals:   09/21/20 1105 09/21/20 1200 09/21/20 1210 09/21/20 1300  BP:   (!) 114/46 (!) 121/48  Pulse: 99 89 81 87  Resp: 17 (!) 21 18 17   Temp:   97.8 F (36.6 C)   TempSrc:   Axillary   SpO2: 95% 96% 96% 97%  Weight:      Height:        Intake/Output Summary (Last 24 hours) at 09/21/2020 1353 Last data filed at 09/21/2020 1200 Gross per 24 hour  Intake 1437 ml  Output 400 ml  Net 1037 ml   Filed Weights   09/19/20 0500 09/20/20 0500 09/21/20 0500  Weight: 67 kg 61.1 kg 59.7 kg    Examination:  General exam: No acute distress.  Confused Respiratory system: Scattered crackles bilaterally.  Normal work of breathing.  2 L Cardiovascular system: S1-S2, regular rate and rhythm, no murmurs Gastrointestinal system: Abdomen is nondistended, soft and nontender. No organomegaly or masses felt. Normal bowel sounds heard. Central nervous system: Alert, oriented to person only.  No focal deficits  extremities: Symmetric 5 x 5 power. Skin: No rashes, lesions or ulcers Psychiatry: Judgement and insight appear impaired. Mood & affect confused.     Data Reviewed: I have personally reviewed following labs and imaging studies  CBC: Recent Labs  Lab 09/16/20 0500 09/17/20 0406 09/18/20 0550 09/19/20 0418 09/20/20 0356 09/21/20 0206  WBC 21.4* 28.8* 34.3* 28.5* 20.1* 31.7*  NEUTROABS 14.1*  --   --   --  12.2* 24.6*  HGB 11.3* 12.3 13.5 14.3 14.6 14.6  HCT 37.0 40.4 43.2 46.5* 48.3* 48.7*  MCV 97.9 98.1 97.1 97.3 99.6 100.2*  PLT 256 280 358 361 335 035   Basic Metabolic Panel: Recent Labs  Lab 09/17/20 0406 09/18/20 0550 09/19/20 0418 09/20/20 0356 09/21/20 0206  NA 139 139 143 145 147*  K 4.3 3.3* 5.3* 3.0* 3.7  CL 100 101 106 107 112*  CO2 28 27 22 25 25   GLUCOSE 107* 92 89 124* 127*  BUN 64* 52* 67* 68* 73*  CREATININE 0.66 0.51 0.66 0.66 0.65   CALCIUM 8.9 9.0 9.3 9.1 9.4  MG 2.2 2.3 2.5* 2.7* 2.5*  PHOS 4.5 3.5 4.4 4.2 3.6   GFR: Estimated Creatinine Clearance: 47.8 mL/min (by C-G formula based on SCr of 0.65 mg/dL). Liver Function Tests: No results for input(s): AST, ALT, ALKPHOS, BILITOT, PROT, ALBUMIN in the last 168 hours. No results for input(s): LIPASE, AMYLASE in the last 168 hours. No results for input(s): AMMONIA in the last 168 hours. Coagulation Profile: No results for input(s): INR, PROTIME in the last 168 hours. Cardiac Enzymes: No results for input(s): CKTOTAL, CKMB, CKMBINDEX, TROPONINI in the last 168 hours. BNP (last 3 results) No results for input(s): PROBNP in the last 8760 hours. HbA1C: No results for input(s): HGBA1C in the last 72 hours. CBG: Recent Labs  Lab 09/20/20 1917 09/20/20 2317 09/21/20 0319 09/21/20 0748 09/21/20 1115  GLUCAP 197* 118* 110* 127* 146*   Lipid Profile: No results for input(s): CHOL, HDL, LDLCALC, TRIG, CHOLHDL, LDLDIRECT in the last 72 hours. Thyroid Function Tests: No results for input(s): TSH, T4TOTAL, FREET4, T3FREE, THYROIDAB in the last 72 hours. Anemia Panel: No results for input(s): VITAMINB12, FOLATE, FERRITIN, TIBC, IRON, RETICCTPCT in the last 72 hours. Sepsis Labs: No results for input(s): PROCALCITON, LATICACIDVEN in the last 168 hours.  Recent Results (from the past 240 hour(s))  Culture, Respiratory w Gram Stain     Status: None   Collection Time: 09/16/20  8:35 AM   Specimen: Tracheal Aspirate; Respiratory  Result Value Ref Range Status   Specimen Description   Final    TRACHEAL ASPIRATE Performed at [facility], [address], [place], [place] [address]    Special Requests   Final    NONE Performed at [facility] [address] [place], [place] [address]    Gram Stain   Final    ABUNDANT WBC PRESENT, PREDOMINANTLY PMN FEW YEAST Performed at [facility], [place] [address], [place], [place] [address]    Culture  FEW CANDIDA ALBICANS  Final   Report Status 09/19/2020 FINAL  Final         Radiology Studies: DG Abd 1 View  Result Date: 09/21/2020 CLINICAL DATA:  Pain EXAM: ABDOMEN - 1 VIEW COMPARISON:  Sep 19, 2020. FINDINGS: Feeding tube no longer present. There is moderate stool in the colon. There is no bowel dilatation or air-fluid level to suggest bowel obstruction. No evident free air. IMPRESSION: Moderate stool in colon. No evident bowel obstruction or free air on supine examination. Electronically Signed   By: Lowella Grip III M.D.   On: 09/21/2020 09:57   DG Chest Port 1 View  Result Date: 09/21/2020 CLINICAL DATA:  Shortness of breath EXAM: PORTABLE CHEST 1 VIEW COMPARISON:  Sep 17, 2020 FINDINGS: Endotracheal tube and nasogastric tube have been removed. No pneumothorax. There is atelectatic change in the lung bases. The lungs otherwise are clear. Heart size and pulmonary vascularity are normal. No adenopathy. There is aortic atherosclerosis. No bone lesions. IMPRESSION: No pneumothorax.  Bibasilar atelectasis. There may be subtle interspersed infiltrate in these areas in the bases. Lungs otherwise clear. Heart size normal.  Aortic Atherosclerosis (ICD10-I70.0). Electronically Signed   By: Lowella Grip III M.D.   On: 09/21/2020 09:57        Scheduled Meds: . aspirin  81 mg Oral QHS  . budesonide (PULMICORT) nebulizer solution  0.25 mg Nebulization BID  . buprenorphine-naloxone  2 tablet Sublingual Daily  . Chlorhexidine Gluconate Cloth  6 each Topical QHS  . clopidogrel  75 mg Oral QHS  . feeding supplement (NEPRO CARB STEADY)  237 mL Oral TID BM  . fluticasone  1 spray Each Nare Daily  . insulin aspart  0-15 Units Subcutaneous Q4H  . ipratropium-albuterol  3 mL Nebulization TID  . levothyroxine  88 mcg Oral Q0600  . loratadine  10 mg Oral Daily  . mouth rinse  15 mL Mouth Rinse BID  . metoprolol tartrate  25 mg Oral BID  . multivitamin with minerals  1 tablet Oral Daily  .  pantoprazole  40 mg Oral Daily  . predniSONE  20 mg Oral Q breakfast  . rosuvastatin  20 mg Oral QHS  . sacubitril-valsartan  1 tablet Oral BID   Continuous Infusions: . sodium chloride 10 mL/hr at 09/17/20 0700     LOS: 11 days    Time spent: 15 minutes    Sidney Ace, MD [facility] Pager [phone]  If 7PM-7AM, please contact night-coverage 09/21/2020, 1:53 PM

## 2020-09-21 NOTE — Progress Notes (Signed)
Inpatient Rehabilitation Admissions Coordinator  Inpatient rehab consult received. I spoke with her daughter, Amy, by phone to discuss CIR goals and expectations. Amy would like to discuss further with her other sister preference for rehab venue for she and her sister live in the [place] area and may prefer AIR in that location. I discussed that [facility] may not approve an AIR admit and therefore other rehab venues need to be also considered. Amy will further discuss with family and contact me with venue preference. Noted patient likely to need cardiac cath prior to discharge .  Danne Baxter, RN, MSN Rehab Admissions Coordinator [phone] 09/21/2020 4:57 PM

## 2020-09-21 NOTE — Progress Notes (Signed)
Pharmacy Electrolyte Monitoring Consult:  Labs:  Sodium (mmol/L)  Date Value  09/21/2020 147 (H)  10/02/2019 141  03/23/2013 138   Potassium (mmol/L)  Date Value  09/21/2020 3.7  03/23/2013 4.2   Magnesium (mg/dL)  Date Value  09/21/2020 2.5 (H)  03/20/2013 2.0   Phosphorus (mg/dL)  Date Value  09/21/2020 3.6   Calcium (mg/dL)  Date Value  09/21/2020 9.4   Calcium, Total (mg/dL)  Date Value  03/23/2013 9.0   Albumin (g/dL)  Date Value  09/11/2020 3.4 (L)  10/02/2019 4.2  03/19/2013 3.3 (L)    Assessment: Patient is a 75 y/o F with medical history including CAD s/p stenting, CHF, COPD who is admitted with COPD exacerbation / pneumonia / decompensated heart failure / NSTEMI. Pharmacy has been consulted to assist with electrolyte monitoring and replacement as indicated.   Nepro TID between meals On Lasix 20 IV daily, Entresto 24/26mg  po bid  Goals of therapy: K 4-5.1 mmol/L Mg 2-2.4 mg/dL All other electrolytes WNL  Plan   K 3.7 - will replace with 36mEq Kcl x2  Na 147 - trending up. Pt on 20mg  IV lasix. Will monitor for now  Follow-up electrolytes with AM labs tomorrow  Sherilyn Banker, PharmD Pharmacy Resident  09/21/2020 6:48 AM

## 2020-09-21 NOTE — Progress Notes (Signed)
Speech Language Pathology Treatment: Dysphagia  Patient Details Name: Kathryn Garrett MRN: [id number] DOB: Feb 09, 1946 Today's Date: 09/21/2020 Time: 1050-1150 SLP Time Calculation (min) (ACUTE ONLY): 60 min  Assessment / Plan / Recommendation Clinical Impression  Pt seen for ongoing assessment of swallowing. She appears to improve a little each day; sitting in chair, alert. Pt quite verbally responsive and talkative; followed instructions w/ min cues -- easily distracted and weak UEs bilat. Pt has been orally extubated since 09/17/2020. NG removed post ST session yesterday. Pt is on 2L [place] O2 support; noted wbc strikingly elevated(?). Noted per NP notes, Echocardiogram this admission showed severely reduced ejection fraction, EF 25 to 30%. Pt is eager to drink coffee; NSG provided some earlier per MD ok, but coughing noted w/ intake. Pt's Niece, family present in room. Pt/Family explained general aspiration precautions and agreed verbally to the need for following them especially sitting upright for all oral intake, little to No Talking during oral intake, and taking Small, Single sips(min cues needed during trials). Pt assisted w/ holding Cup but helped to feed self trials of ice chips, thin and Nectar liquids, then softened solids Minced in purees. Overt, clinical s/s of aspiration were noted w/ trials of ice chips and thin liquids c/b a delayed and immediate cough fairly consistently w/ trials -- ~6/8 trials w/ this SLP. No overt, clinical s/s of aspiration were noted w/ trials of Nectar liquids via cup/straw or purees/Minced solids; respiratory status remained calm and unlabored, vocal quality clear b/t trials, No cough. O2 sats remained mid90s. Pt helped to hold Cup when drinking following instructions for single, small sips slowly -- quite weak UEs bilat. Oral phase appeared grossly [facility] for bolus management and timely A-P transfer for swallowing; oral clearing achieved w/ all consistencies including  mashing/gumming Minced/puree foods b/f A-P transfer/swallow. Pt is unable to wear her Dentures d/t ill-fit and discomfort.  Discussed pt's presentation w/ Family, NSG, and MD; recommended remaining on Nectar consistency liquids d/t overt clinical s/s of aspiration and risk for Pulmonary decline from aspiration pneumonia. Recommend diet upgrade to a Dysphagia level 2 diet (MINCED foods w/ gravies added to moisten foods; Nectar liquids, cup/straw. Recommend general aspiration precautions; Pills Whole in Puree; tray setup, positioning assistance, and feeding assistance for meals d/t UE weakness and ease of Distraction/mental status. ST services will continue to f/u w/ pt for toleration of diet, trials to upgrade liquid consistency next 1-2 days w/ possible need for MBSS for objective assessment, and education while admitted. MD/NSG updated, agreed. Precautions posted at bedside.  Education given to pt/Family on dysphagia/swallowing; general aspiration precautions; food and liquid consistencies; education on dysphagia; aspiration and its consequences. Family agreed.    HPI HPI: Pt is a 75 yo female admitted for COPD exacerbation and left lower lobe pneumonia. Tracheal aspirate from 4/30 with KLEBSIELLA PNEUMONIAE (resistant to Ampicillin). Head CT 09/17/2020 No acute intracranial abnormality.Past medical history including HRrEF, tobacco abuse, CAD s/p stenting, CHF, COPD who is admitted with COPD exacerbation / pneumonia / decompensated heart failure / NSTEMI.  Pt intubated 09/11/2020 thru 09/17/2020. Extubated then w/ NG placed for meds, po's.      SLP Plan  Continue with current plan of care       Recommendations  Diet recommendations: Dysphagia 2 (fine chop);Nectar-thick liquid Liquids provided via: Cup;Straw (monitor) Medication Administration: Whole meds with puree (for safer swallowing) Supervision: Patient able to self feed;Staff to assist with self feeding;Intermittent supervision to cue for  compensatory strategies Compensations: Minimize environmental  distractions;Slow rate;Small sips/bites;Lingual sweep for clearance of pocketing;Follow solids with liquid Postural Changes and/or Swallow Maneuvers: Out of bed for meals;Seated upright 90 degrees;Upright 30-60 min after meal                General recommendations:  (Dietician f/u) Oral Care Recommendations: Oral care BID;Oral care before and after PO;Staff/trained caregiver to provide oral care Follow up Recommendations:  (TBD) SLP Visit Diagnosis: Dysphagia, pharyngeal phase (R13.13) Plan: Continue with current plan of care       GO                  [name], [place], [place] Pathologist Rehab Services [phone] [facility] 09/21/2020, 3:50 PM

## 2020-09-21 NOTE — Progress Notes (Signed)
Occupational Therapy Treatment Patient Details Name: Kathryn Garrett MRN: [id number] DOB: [birth year] Today's Date: 09/21/2020    History of present illness 75 y.o. female with history of CAD with VF arrest s/p PCI to the RCA in 2017 s/p PTCA to the RCA for ISR in 2018, chronic combined systolic and diastolic CHF, COPD with ongoing tobacco use, HTN, HLD, anemia, and narcotic abuse on Suboxone therapy admitted with acute hypoxic respiratory failure requiring mechanical ventilation (extubated 09/17/2020) secondary to COPD exacerbation Klebsiella PNA and acute on chronic combined CHF with transient ST elevation and echo demonstrating new LV dysfunction.   OT comments  Upon entering the room, pt seated in recliner chair with family present in the room. Pt has been sitting up in recliner chair for ~ 4 hours since morning therapy session. Pt requires max A to scoot buttocks forwards towards edge of recliner chair. Pt standing with max A and initiating transfer without use of AD this session and forward facing therapist. Pt's LEs do no buckle. Pt needing assistance to sit back onto bed secondary to pt being 4'11. Use of second helper for management of lines/equipment. Max A for sit >supine and repositioning in bed. Pt holding drink in B UEs to bring to mouth without spillage. RN present and giving pt medications at end of session and pt does endorse fatigue. Family requesting prevalon boots and OT secure chatting MD request. Pt is able to demonstrate ankle pumps with cuing x 5 reps each. Pt continues to benefit from OT intervention and would benefit from intensive inpatient rehabilitation program.   Pt tolerating 2 therapy sessions today and sitting up in recliner chair for 4 hours. Excellent progress made towards therapy goals.    Follow Up Recommendations  CIR    Equipment Recommendations  3 in 1 bedside commode;Tub/shower seat       Precautions / Restrictions Precautions Precautions:  Fall Restrictions Weight Bearing Restrictions: No       Mobility Bed Mobility Overal bed mobility: Needs Assistance Bed Mobility: Sit to Supine     Supine to sit: Mod assist Sit to supine: Max assist   General bed mobility comments: needs use of hand rail. Half way through movement, rest break required with pt reporting "all my energy is used". Pt then able to complete task and sit at EOB. Slight dizziness, however dissapates quickly.    Transfers Overall transfer level: Needs assistance Equipment used: None Transfers: Sit to/from [facility] Sit to Stand: Max assist Stand pivot transfers: Max assist       General transfer comment: +2 assistance to manage equipment/lines. Pt 4'11 and needs assistance sitting on hospital bed even with it being on lowest setting secondary to height    Balance Overall balance assessment: Needs assistance Sitting-balance support: Feet unsupported;Bilateral upper extremity supported Sitting balance-Leahy Scale: Fair Sitting balance - Comments: able to demonstrate static sitting with constant supervision   Standing balance support: Bilateral upper extremity supported Standing balance-Leahy Scale: Poor Standing balance comment: needs assistance for safety                           ADL either performed or assessed with clinical judgement   ADL Overall ADL's : Needs assistance/impaired Eating/Feeding: Minimal assistance;Set up;Bed level  Vision Patient Visual Report: No change from baseline            Cognition Arousal/Alertness: Awake/alert Behavior During Therapy: WFL for tasks assessed/performed Overall Cognitive Status: Impaired/Different from baseline Area of Impairment: Attention;Memory;Safety/judgement;Awareness;Following commands;Problem solving                 Orientation Level: Disoriented to;Time;Situation   Memory: Decreased  short-term memory Following Commands: Follows one step commands inconsistently;Follows one step commands with increased time Safety/Judgement: Decreased awareness of safety;Decreased awareness of deficits Awareness: Intellectual Problem Solving: Slow processing;Decreased initiation;Requires tactile cues;Requires verbal cues General Comments: impulsive and impaired safety awareness. Inconsistently follows 1 step commands        Exercises Exercises: Other exercises Other Exercises Other Exercises: supine ther-ex performed on B LE including quad sets, SLRs, hip abd/add, hip add squeezes, and SAQ. All ther-ex performed x 10 reps with cues for attention to task. Easily distracted.           Pertinent Vitals/ Pain       Pain Assessment: Faces Faces Pain Scale: No hurt         Frequency  Min 2X/week        Progress Toward Goals  OT Goals(current goals can now be found in the care plan section)  Progress towards OT goals: Progressing toward goals  Acute Rehab OT Goals Patient Stated Goal: get better OT Goal Formulation: With patient/family Time For Goal Achievement: 10/03/20 Potential to Achieve Goals: Good  Plan Discharge plan remains appropriate       AM-PAC OT "6 Clicks" Daily Activity     Outcome Measure   Help from another person eating meals?: A Lot Help from another person taking care of personal grooming?: A Lot Help from another person toileting, which includes using toliet, bedpan, or urinal?: Total Help from another person bathing (including washing, rinsing, drying)?: Total Help from another person to put on and taking off regular upper body clothing?: A Lot Help from another person to put on and taking off regular lower body clothing?: Total 6 Click Score: 9    End of Session    OT Visit Diagnosis: Unsteadiness on feet (R26.81);Muscle weakness (generalized) (M62.81);Other symptoms and signs involving cognitive function   Activity Tolerance Patient  tolerated treatment well   Patient Left in bed;with call bell/phone within reach;with family/visitor present;with nursing/sitter in room   Nurse Communication Mobility status        Time: 1345-1409 OT Time Calculation (min): 24 min  Charges: OT General Charges $OT Visit: 1 Visit OT Treatments $Self Care/Home Management : 8-22 mins $Therapeutic Activity: 8-22 mins  Darleen Crocker, MS, OTR/L , CBIS ascom [phone]  09/21/20, 2:52 PM

## 2020-09-22 DIAGNOSIS — I5020 Unspecified systolic (congestive) heart failure: Secondary | ICD-10-CM | POA: Diagnosis not present

## 2020-09-22 DIAGNOSIS — I214 Non-ST elevation (NSTEMI) myocardial infarction: Secondary | ICD-10-CM | POA: Diagnosis not present

## 2020-09-22 LAB — CBC WITH DIFFERENTIAL/PLATELET
Abs Immature Granulocytes: 0.06 10*3/uL (ref 0.00–0.07)
Basophils Absolute: 0 10*3/uL (ref 0.0–0.1)
Basophils Relative: 0 %
Eosinophils Absolute: 0.1 10*3/uL (ref 0.0–0.5)
Eosinophils Relative: 0 %
HCT: 44.3 % (ref 36.0–46.0)
Hemoglobin: 13.5 g/dL (ref 12.0–15.0)
Immature Granulocytes: 0 %
Lymphocytes Relative: 30 %
Lymphs Abs: 5.7 10*3/uL — ABNORMAL HIGH (ref 0.7–4.0)
MCH: 30.8 pg (ref 26.0–34.0)
MCHC: 30.5 g/dL (ref 30.0–36.0)
MCV: 101.1 fL — ABNORMAL HIGH (ref 80.0–100.0)
Monocytes Absolute: 1.1 10*3/uL — ABNORMAL HIGH (ref 0.1–1.0)
Monocytes Relative: 6 %
Neutro Abs: 12.3 10*3/uL — ABNORMAL HIGH (ref 1.7–7.7)
Neutrophils Relative %: 64 %
Platelets: 279 10*3/uL (ref 150–400)
RBC: 4.38 MIL/uL (ref 3.87–5.11)
RDW: 16.4 % — ABNORMAL HIGH (ref 11.5–15.5)
Smear Review: NORMAL
WBC: 19.3 10*3/uL — ABNORMAL HIGH (ref 4.0–10.5)
nRBC: 0 % (ref 0.0–0.2)

## 2020-09-22 LAB — BASIC METABOLIC PANEL
Anion gap: 9 (ref 5–15)
BUN: 57 mg/dL — ABNORMAL HIGH (ref 8–23)
CO2: 25 mmol/L (ref 22–32)
Calcium: 9.5 mg/dL (ref 8.9–10.3)
Chloride: 112 mmol/L — ABNORMAL HIGH (ref 98–111)
Creatinine, Ser: 0.56 mg/dL (ref 0.44–1.00)
GFR, Estimated: >60 mL/min (ref >60)
Glucose, Bld: 98 mg/dL (ref 70–99)
Potassium: 4.4 mmol/L (ref 3.5–5.1)
Sodium: 146 mmol/L — ABNORMAL HIGH (ref 135–145)

## 2020-09-22 LAB — GLUCOSE, CAPILLARY
Glucose-Capillary: 105 mg/dL — ABNORMAL HIGH (ref 70–99)
Glucose-Capillary: 110 mg/dL — ABNORMAL HIGH (ref 70–99)
Glucose-Capillary: 137 mg/dL — ABNORMAL HIGH (ref 70–99)
Glucose-Capillary: 165 mg/dL — ABNORMAL HIGH (ref 70–99)
Glucose-Capillary: 89 mg/dL (ref 70–99)
Glucose-Capillary: 92 mg/dL (ref 70–99)

## 2020-09-22 LAB — PHOSPHORUS: Phosphorus: 3.5 mg/dL (ref 2.5–4.6)

## 2020-09-22 LAB — MAGNESIUM: Magnesium: 2.5 mg/dL — ABNORMAL HIGH (ref 1.7–2.4)

## 2020-09-22 MED ORDER — SODIUM CHLORIDE 0.9% FLUSH
3.0000 mL | Freq: Two times a day (BID) | INTRAVENOUS | Status: DC
Start: 2020-09-22 — End: 2020-10-13
  Administered 2020-09-22 – 2020-10-13 (×41): 3 mL via INTRAVENOUS

## 2020-09-22 MED ORDER — METOPROLOL SUCCINATE ER 25 MG PO TB24
25.0000 mg | ORAL_TABLET | Freq: Every day | ORAL | Status: DC
  Administered 2020-09-22 – 2020-09-25 (×3): 25 mg via ORAL
  Filled 2020-09-22 (×4): qty 1

## 2020-09-22 MED ORDER — ENOXAPARIN SODIUM 40 MG/0.4ML IJ SOSY
40.0000 mg | PREFILLED_SYRINGE | INTRAMUSCULAR | Status: DC
  Administered 2020-09-22 – 2020-10-02 (×11): 40 mg via SUBCUTANEOUS
  Filled 2020-09-22 (×11): qty 0.4

## 2020-09-22 NOTE — Plan of Care (Signed)
Patient remained on room air this shift and has maintained SpO2 >93%. She participated in PT/OT services and transferred Reynolds w/ 2 person assist. She sat OOB for about 4 hours. While in bed patient runs NSR, low ST with HR in the 100s. With any exertion, patient's HR is elevated in the 130-140s, but improves when activity is stopped. Remains afebrile this shift. Improving PO intake, family brought her food per her diet restrictions and assisted with meals. Patient used bedside commode, she had 2 bowel movements. Per cardiology, planning for [facility] on Friday 6/13.    Problem: Health Behavior/Discharge Planning: Goal: Ability to manage health-related needs will improve Outcome: Progressing   Problem: Clinical Measurements: Goal: Ability to maintain clinical measurements within normal limits will improve Outcome: Progressing Goal: Will remain free from infection Outcome: Progressing Goal: Diagnostic test results will improve Outcome: Progressing Goal: Respiratory complications will improve Outcome: Progressing Goal: Cardiovascular complication will be avoided Outcome: Progressing   Problem: Activity: Goal: Risk for activity intolerance will decrease Outcome: Progressing   Problem: Coping: Goal: Level of anxiety will decrease Outcome: Progressing   Problem: Elimination: Goal: Will not experience complications related to bowel motility Outcome: Progressing Goal: Will not experience complications related to urinary retention Outcome: Progressing   Problem: Pain Managment: Goal: General experience of comfort will improve Outcome: Progressing

## 2020-09-22 NOTE — Progress Notes (Signed)
Physical Therapy Treatment Patient Details Name: Kathryn Garrett MRN: [id number] DOB: 1945-11-03 Today's Date: 09/22/2020    History of Present Illness 75 y.o. female with history of CAD with VF arrest s/p PCI to the RCA in 2017 s/p PTCA to the RCA for ISR in 2018, chronic combined systolic and diastolic CHF, COPD with ongoing tobacco use, HTN, HLD, anemia, and narcotic abuse on Suboxone therapy admitted with acute hypoxic respiratory failure requiring mechanical ventilation (extubated 09/17/2020) secondary to COPD exacerbation Klebsiella PNA and acute on chronic combined CHF with transient ST elevation and echo demonstrating new LV dysfunction.       Follow Up Recommendations  CIR     Equipment Recommendations  None recommended by PT       Precautions / Restrictions Precautions Precautions: Fall    Mobility  Bed Mobility Overal bed mobility: Needs Assistance Bed Mobility: Supine to Sit;Sit to Supine     Supine to sit: Min assist;+2 for safety/equipment;HOB elevated Sit to supine: Max assist   Transfers Overall transfer level: Needs assistance Equipment used: None Transfers: Sit to/from Stand Sit to Stand: Min assist;From elevated surface;+2 safety/equipment  General transfer comment: Stood 3-4 x EOB for short periods. HR elevation quickly but recovers quickly as well.  Ambulation/Gait Ambulation/Gait assistance: Mod assist;+2 physical assistance Gait Distance (Feet): 5 Feet Assistive device: Rolling walker (2 wheeled) Gait Pattern/deviations: Step-to pattern Gait velocity: decreased   General Gait Details: Pt fatigues extremely quickly and required +2 assist and max vcs for safety to make it to recliner. Highly recommendstand pivot back to bed from recliner.       Balance Overall balance assessment: Needs assistance Sitting-balance support: Feet unsupported;Bilateral upper extremity supported Sitting balance-Leahy Scale: Fair     Standing balance support:  Bilateral upper extremity supported Standing balance-Leahy Scale: Fair Standing balance comment: reliant on UE support throughout standing activity. standing tolerance limited by fatigue      Cognition Arousal/Alertness: Awake/alert Behavior During Therapy: WFL for tasks assessed/performed Overall Cognitive Status: Impaired/Different from baseline Area of Impairment: Attention;Memory;Safety/judgement;Awareness;Following commands;Problem solving      Memory: Decreased short-term memory;Decreased recall of precautions Following Commands: Follows one step commands consistently;Follows one step commands with increased time Safety/Judgement: Decreased awareness of safety;Decreased awareness of deficits Awareness: Intellectual Problem Solving: Slow processing;Decreased initiation;Requires tactile cues;Requires verbal cues General Comments: Pt is alert and conversational but author questions overall cognition. She has poor safety awareness and poor carry-over between task         General Comments General comments (skin integrity, edema, etc.): educated on ther ex to perform throughout the day to promote strengthenig and return in function      Pertinent Vitals/Pain Pain Assessment: No/denies pain           PT Goals (current goals can now be found in the care plan section) Acute Rehab PT Goals Patient Stated Goal: get better Progress towards PT goals: Progressing toward goals    Frequency    7X/week      PT Plan Current plan remains appropriate       AM-PAC PT "6 Clicks" Mobility   Outcome Measure  Help needed turning from your back to your side while in a flat bed without using bedrails?: A Little Help needed moving from lying on your back to sitting on the side of a flat bed without using bedrails?: A Lot Help needed moving to and from a bed to a chair (including a wheelchair)?: A Lot Help needed standing up from a chair using  your arms (e.g., wheelchair or bedside  chair)?: A Lot Help needed to walk in hospital room?: A Lot Help needed climbing 3-5 steps with a railing? : Total 6 Click Score: 12    End of Session Equipment Utilized During Treatment: Gait belt Activity Tolerance: Patient tolerated treatment well Patient left: in chair;with call bell/phone within reach;with chair alarm set;with nursing/sitter in room;with family/visitor present Nurse Communication: Mobility status PT Visit Diagnosis: Difficulty in walking, not elsewhere classified (R26.2);Muscle weakness (generalized) (M62.81);Unsteadiness on feet (R26.81)     Time: 1450-1535 PT Time Calculation (min) (ACUTE ONLY): 45 min  Charges:  $Therapeutic Exercise: 8-22 mins $Therapeutic Activity: 8-22 mins                     Julaine Fusi PTA 09/22/20, 4:48 PM

## 2020-09-22 NOTE — Progress Notes (Signed)
Patient has had poor PO intake, stating she does not like hospital cafeteria food and requested her daughters bring in food from elsewhere. Patient's daughters brought in chicken & dumplings and green beans with plans to chop/puree and assist with eating. Patient tolerating without any s/s of aspiration (no coughing or clearing throat) or resp distress.

## 2020-09-22 NOTE — Progress Notes (Addendum)
Pharmacy Electrolyte Monitoring Consult:  Labs:  Sodium (mmol/L)  Date Value  09/22/2020 146 (H)  10/02/2019 141  03/23/2013 138   Potassium (mmol/L)  Date Value  09/22/2020 4.4  03/23/2013 4.2   Magnesium (mg/dL)  Date Value  09/22/2020 2.5 (H)  03/20/2013 2.0   Phosphorus (mg/dL)  Date Value  09/22/2020 3.5   Calcium (mg/dL)  Date Value  09/22/2020 9.5   Calcium, Total (mg/dL)  Date Value  03/23/2013 9.0   Albumin (g/dL)  Date Value  09/11/2020 3.4 (L)  10/02/2019 4.2  03/19/2013 3.3 (L)    Assessment: Patient is a 75 y/o F with medical history including CAD s/p stenting, CHF, COPD who is admitted with COPD exacerbation / pneumonia / decompensated heart failure / NSTEMI. Pharmacy has been consulted to assist with electrolyte monitoring and replacement as indicated.   Nepro TID between meals On Lasix 20 IV daily d/c'd 5/10 Entresto 24/26mg  PO BID  Goals of therapy: K 4-5.1 mmol/L Mg 2-2.4 mg/dL All other electrolytes WNL  Plan   Na 146, down from 147 yesterday. Will monitor for now  Electrolyte consulted initiated while pt in ICU. Pt being transferred off the floor. Pharmacy will sign off for now.   Sherilyn Banker, PharmD Pharmacy Resident  09/22/2020 6:48 AM

## 2020-09-22 NOTE — Progress Notes (Signed)
PROGRESS NOTE    Kathryn Garrett  PJA:[id number] DOB: 1946/02/22 DOA: 09/10/2020 PCP: Imagene Riches, NP  Brief Narrative:  74/F presented to the [facility] ED from home with complaints of shortness of breath -Admitted on 4/29 with hypoxic respiratory failure secondary to COPD exacerbation and community-acquired pneumonia -Hospital course was complicated by flash pulmonary edema and NSTEMI requiring mechanical ventilation, intubated on 4/30 -Treated with antibiotics and diuretics -Eventually extubated on 5/7 [facility] course further complicated by encephalopathy -Cardiology following, planning left heart catheterization when respiratory and mental status is stabilized   Assessment & Plan:    Acute hypoxic respiratory failure  Acute COPD Exacerbation/left lung multifocal pneumonia Flash pulmonary edema on 4/30 -Intubated for flash pulmonary edema on, extubated 5/7 -Clinically improving, completed antibiotic course -Treated with diuretics as well -Improved and stable, weaned off O2  Sepsis, left lower lobe pneumonia -tracheal aspirate on 4/30 with KLEBSIELLA PNEUMONIAE (resistant to Ampicillin) -Completed 7 days of antibiotics. -Improving, leukocytosis trending down  Acute systolic congestive heart failure/Flash pulmonary edema NSTEMI New diagnosis of A. fib with RVR -Echo noted with drop in EF down to 30%  -Previously on inotropes in ICU, weaned off, also treated with IV heparin briefly  -Cardiology following, continue metoprolol, IV Lasix, Entresto -Also on aspirin Plavix -Cardiology following, plan for left heart cath on Friday  Acute metabolic/toxic encephalopathy -ICU stay complicated by severe encephalopathy while on vent -At baseline on buprenorphine/naloxone which was held on admission, now resumed -Also had an MRI brain which was unremarkable -Improved and stable now, PT OT as tolerated  COPD Ongoing tobacco abuse -Counseled, taper of prednisone, continue duo  nebs  DVT prophylaxis: Add Lovenox Code Status: Full Family Communication: No family at bedside, discussed patient in detail Disposition Plan: Status is: Inpatient  Remains inpatient appropriate because:Inpatient level of care appropriate due to severity of illness   Dispo: The patient is from: Home              Anticipated d/c is to: SNF versus CIR              Patient currently is not medically stable to d/c.   Difficult to place patient No Consultants:   Cardiology   Procedures: Endotracheal intubation  Antimicrobials:  None   Subjective: -Feels better, breathing improving, tells me her mind is much clearer  Objective: Vitals:   09/22/20 1000 09/22/20 1100 09/22/20 1200 09/22/20 1300  BP: 115/65 (!) 119/52 118/65   Pulse: 94  84 77  Resp: (!) 21 16 18 16   Temp:      TempSrc:      SpO2: 100%  92% 92%  Weight:      Height:        Intake/Output Summary (Last 24 hours) at 09/22/2020 1552 Last data filed at 09/22/2020 1300 Gross per 24 hour  Intake 563 ml  Output 1000 ml  Net -437 ml   Filed Weights   09/20/20 0500 09/21/20 0500 09/22/20 0500  Weight: 61.1 kg 59.7 kg 59.3 kg    Examination:  General exam: Chronically ill pleasant female sitting up in bed, AAOx3, no distress CVS: S1-S2, regular rate rhythm Lungs: Few scattered basilar rhonchi, otherwise clear Abdomen: Soft, nontender, bowel sounds present Extremities: No edema Skin: No rashes on exposed skin  Psychiatry: Appropriate mood and affect   Data Reviewed: I have personally reviewed following labs and imaging studies  CBC: Recent Labs  Lab 09/16/20 0500 09/17/20 0406 09/18/20 0550 09/19/20 0418 09/20/20 0356 09/21/20 0206 09/22/20  0418  WBC 21.4*   < > 34.3* 28.5* 20.1* 31.7* 19.3*  NEUTROABS 14.1*  --   --   --  12.2* 24.6* 12.3*  HGB 11.3*   < > 13.5 14.3 14.6 14.6 13.5  HCT 37.0   < > 43.2 46.5* 48.3* 48.7* 44.3  MCV 97.9   < > 97.1 97.3 99.6 100.2* 101.1*  PLT 256   < > 358 361  335 331 279   < > = values in this interval not displayed.   Basic Metabolic Panel: Recent Labs  Lab 09/18/20 0550 09/19/20 0418 09/20/20 0356 09/21/20 0206 09/22/20 0418  NA 139 143 145 147* 146*  K 3.3* 5.3* 3.0* 3.7 4.4  CL 101 106 107 112* 112*  CO2 27 22 25 25 25   GLUCOSE 92 89 124* 127* 98  BUN 52* 67* 68* 73* 57*  CREATININE 0.51 0.66 0.66 0.65 0.56  CALCIUM 9.0 9.3 9.1 9.4 9.5  MG 2.3 2.5* 2.7* 2.5* 2.5*  PHOS 3.5 4.4 4.2 3.6 3.5   GFR: Estimated Creatinine Clearance: 47.6 mL/min (by C-G formula based on SCr of 0.56 mg/dL). Liver Function Tests: No results for input(s): AST, ALT, ALKPHOS, BILITOT, PROT, ALBUMIN in the last 168 hours. No results for input(s): LIPASE, AMYLASE in the last 168 hours. No results for input(s): AMMONIA in the last 168 hours. Coagulation Profile: No results for input(s): INR, PROTIME in the last 168 hours. Cardiac Enzymes: No results for input(s): CKTOTAL, CKMB, CKMBINDEX, TROPONINI in the last 168 hours. BNP (last 3 results) No results for input(s): PROBNP in the last 8760 hours. HbA1C: No results for input(s): HGBA1C in the last 72 hours. CBG: Recent Labs  Lab 09/21/20 1938 09/21/20 2342 09/22/20 0349 09/22/20 0733 09/22/20 1124  GLUCAP 98 128* 92 89 137*   Lipid Profile: No results for input(s): CHOL, HDL, LDLCALC, TRIG, CHOLHDL, LDLDIRECT in the last 72 hours. Thyroid Function Tests: No results for input(s): TSH, T4TOTAL, FREET4, T3FREE, THYROIDAB in the last 72 hours. Anemia Panel: No results for input(s): VITAMINB12, FOLATE, FERRITIN, TIBC, IRON, RETICCTPCT in the last 72 hours. Sepsis Labs: No results for input(s): PROCALCITON, LATICACIDVEN in the last 168 hours.  Recent Results (from the past 240 hour(s))  Culture, Respiratory w Gram Stain     Status: None   Collection Time: 09/16/20  8:35 AM   Specimen: Tracheal Aspirate; Respiratory  Result Value Ref Range Status   Specimen Description   Final    TRACHEAL  ASPIRATE Performed at [facility], [address] [place], [place] [address]    Special Requests   Final    NONE Performed at [facility], [address] [place], [place] [address]    Gram Stain   Final    ABUNDANT WBC PRESENT, PREDOMINANTLY PMN FEW YEAST Performed at [facility], [place] [address] [place], [place] [address]    Culture FEW CANDIDA ALBICANS  Final   Report Status 09/19/2020 FINAL  Final         Radiology Studies: DG Abd 1 View  Result Date: 09/21/2020 CLINICAL DATA:  Pain EXAM: ABDOMEN - 1 VIEW COMPARISON:  Sep 19, 2020. FINDINGS: Feeding tube no longer present. There is moderate stool in the colon. There is no bowel dilatation or air-fluid level to suggest bowel obstruction. No evident free air. IMPRESSION: Moderate stool in colon. No evident bowel obstruction or free air on supine examination. Electronically Signed   By: Lowella Grip III M.D.   On:  09/21/2020 09:57   DG Chest Port 1 View  Result Date: 09/21/2020 CLINICAL DATA:  Shortness of breath EXAM: PORTABLE CHEST 1 VIEW COMPARISON:  Sep 17, 2020 FINDINGS: Endotracheal tube and nasogastric tube have been removed. No pneumothorax. There is atelectatic change in the lung bases. The lungs otherwise are clear. Heart size and pulmonary vascularity are normal. No adenopathy. There is aortic atherosclerosis. No bone lesions. IMPRESSION: No pneumothorax. Bibasilar atelectasis. There may be subtle interspersed infiltrate in these areas in the bases. Lungs otherwise clear. Heart size normal.  Aortic Atherosclerosis (ICD10-I70.0). Electronically Signed   By: Lowella Grip III M.D.   On: 09/21/2020 09:57        Scheduled Meds: . aspirin  81 mg Oral QHS  . budesonide (PULMICORT) nebulizer solution  0.25 mg Nebulization BID  . buprenorphine-naloxone  2 tablet Sublingual Daily  . Chlorhexidine Gluconate Cloth  6 each Topical QHS  . clopidogrel  75 mg Oral QHS  . feeding supplement  (NEPRO CARB STEADY)  237 mL Oral TID BM  . fluticasone  1 spray Each Nare Daily  . insulin aspart  0-15 Units Subcutaneous Q4H  . ipratropium-albuterol  3 mL Nebulization TID  . levothyroxine  88 mcg Oral Q0600  . loratadine  10 mg Oral Daily  . mouth rinse  15 mL Mouth Rinse BID  . metoprolol succinate  25 mg Oral Daily  . multivitamin with minerals  1 tablet Oral Daily  . pantoprazole  40 mg Oral Daily  . predniSONE  20 mg Oral Q breakfast  . rosuvastatin  20 mg Oral QHS  . sacubitril-valsartan  1 tablet Oral BID  . sodium chloride flush  3 mL Intravenous Q12H   Continuous Infusions: . sodium chloride 10 mL/hr at 09/17/20 0700     LOS: 12 days    Time spent: 25 minutes    Domenic Polite, MD [facility]  09/22/2020, 3:52 PM

## 2020-09-22 NOTE — Progress Notes (Signed)
Progress Note  Patient Name: Kathryn Garrett Date of Encounter: 09/22/2020  Primary Cardiologist: Larae Grooms, MD  Subjective   No chest pain or sob.  Resting in bed w/ supplemental O2.  Able to lie flat last night w/o difficulty.   Inpatient Medications    Scheduled Meds: . aspirin  81 mg Oral QHS  . budesonide (PULMICORT) nebulizer solution  0.25 mg Nebulization BID  . buprenorphine-naloxone  2 tablet Sublingual Daily  . Chlorhexidine Gluconate Cloth  6 each Topical QHS  . clopidogrel  75 mg Oral QHS  . feeding supplement (NEPRO CARB STEADY)  237 mL Oral TID BM  . fluticasone  1 spray Each Nare Daily  . insulin aspart  0-15 Units Subcutaneous Q4H  . ipratropium-albuterol  3 mL Nebulization TID  . levothyroxine  88 mcg Oral Q0600  . loratadine  10 mg Oral Daily  . mouth rinse  15 mL Mouth Rinse BID  . metoprolol succinate  25 mg Oral Daily  . multivitamin with minerals  1 tablet Oral Daily  . pantoprazole  40 mg Oral Daily  . predniSONE  20 mg Oral Q breakfast  . rosuvastatin  20 mg Oral QHS  . sacubitril-valsartan  1 tablet Oral BID   Continuous Infusions: . sodium chloride 10 mL/hr at 09/17/20 0700   PRN Meds: acetaminophen **OR** acetaminophen, albuterol, artificial tears, docusate, guaiFENesin, ibuprofen, metoprolol tartrate, morphine injection, polyethylene glycol   Vital Signs    Vitals:   09/22/20 0300 09/22/20 0400 09/22/20 0500 09/22/20 0600  BP: 98/63 (!) 107/38 109/87 (!) 109/46  Pulse: 85 64 81 76  Resp: 14 14 17 13   Temp:  97.6 F (36.4 C)    TempSrc:  Axillary    SpO2: 94% 91% 98% 99%  Weight:   59.3 kg   Height:        Intake/Output Summary (Last 24 hours) at 09/22/2020 0850 Last data filed at 09/22/2020 0700 Gross per 24 hour  Intake 560 ml  Output 400 ml  Net 160 ml   Filed Weights   09/20/20 0500 09/21/20 0500 09/22/20 0500  Weight: 61.1 kg 59.7 kg 59.3 kg    Physical Exam   GEN: Well nourished, well developed, in no  acute distress.  HEENT: Grossly normal.  Neck: Supple, no JVD, carotid bruits, or masses. Cardiac: RRR, no murmurs, rubs, or gallops. No clubbing, cyanosis, edema.  Radials 2+, DP/PT 1+ and equal bilaterally.  Respiratory:  Respirations regular and unlabored, diminished breath sounds @ bilateral bases, otw cta. GI: Soft, nontender, nondistended, BS + x 4. MS: no deformity or atrophy.  Lower ext tender to touch. Skin: warm and dry, no rash. Neuro:  Strength and sensation are intact. Psych: AAOx3.  Normal affect.  Labs    Chemistry Recent Labs  Lab 09/20/20 0356 09/21/20 0206 09/22/20 0418  NA 145 147* 146*  K 3.0* 3.7 4.4  CL 107 112* 112*  CO2 25 25 25   GLUCOSE 124* 127* 98  BUN 68* 73* 57*  CREATININE 0.66 0.65 0.56  CALCIUM 9.1 9.4 9.5  GFRNONAA >60 >60 >60  ANIONGAP 13 10 9      Hematology Recent Labs  Lab 09/20/20 0356 09/21/20 0206 09/22/20 0418  WBC 20.1* 31.7* 19.3*  RBC 4.85 4.86 4.38  HGB 14.6 14.6 13.5  HCT 48.3* 48.7* 44.3  MCV 99.6 100.2* 101.1*  MCH 30.1 30.0 30.8  MCHC 30.2 30.0 30.5  RDW 15.9* 16.2* 16.4*  PLT 335 331 279  Cardiac Enzymes  Recent Labs  Lab 09/10/20 2050 09/11/20 0437 09/11/20 1433 09/11/20 1611 09/11/20 2011  TROPONINIHS 1,838* 2,434* 2,265* 2,455* 2,317*      Lipids  Lab Results  Component Value Date   CHOL 107 10/02/2019   HDL 41 10/02/2019   LDLCALC 43 10/02/2019   TRIG 183 (H) 09/15/2020   CHOLHDL 2.6 10/02/2019    HbA1c  Lab Results  Component Value Date   HGBA1C 6.4 (H) 09/11/2020    Radiology    DG Abd 1 View  Result Date: 09/21/2020 CLINICAL DATA:  Pain EXAM: ABDOMEN - 1 VIEW COMPARISON:  Sep 19, 2020. FINDINGS: Feeding tube no longer present. There is moderate stool in the colon. There is no bowel dilatation or air-fluid level to suggest bowel obstruction. No evident free air. IMPRESSION: Moderate stool in colon. No evident bowel obstruction or free air on supine examination. Electronically Signed    By: Lowella Grip III M.D.   On: 09/21/2020 09:57   DG Abd 1 View  Result Date: 09/19/2020 CLINICAL DATA:  Nasogastric tube placement. EXAM: ABDOMEN - 1 VIEW COMPARISON:  One view chest 09/17/2020 FINDINGS: A feeding tube projects over the right upper quadrant of the abdomen consistent with tip in the distal stomach or proximal duodenum. The previously demonstrated nasogastric tube has been removed. The visualized bowel gas pattern is normal. Mild lumbar spine degenerative changes are present. IMPRESSION: Feeding tube projects to the right upper quadrant of the abdomen consistent with position in the distal stomach or proximal duodenum. Electronically Signed   By: Richardean Sale M.D.   On: 09/19/2020 12:12   DG Chest Port 1 View  Result Date: 09/21/2020 CLINICAL DATA:  Shortness of breath EXAM: PORTABLE CHEST 1 VIEW COMPARISON:  Sep 17, 2020 FINDINGS: Endotracheal tube and nasogastric tube have been removed. No pneumothorax. There is atelectatic change in the lung bases. The lungs otherwise are clear. Heart size and pulmonary vascularity are normal. No adenopathy. There is aortic atherosclerosis. No bone lesions. IMPRESSION: No pneumothorax. Bibasilar atelectasis. There may be subtle interspersed infiltrate in these areas in the bases. Lungs otherwise clear. Heart size normal.  Aortic Atherosclerosis (ICD10-I70.0). Electronically Signed   By: Lowella Grip III M.D.   On: 09/21/2020 09:57    Telemetry    RSR - 70s to 90s, rare, brief runs of atrial tachycardia - Personally Reviewed  Cardiac Studies   2D echo 09/11/2020: 1. Recommend limited echo with iv contrast agent (definity) for addequate  assessment of LVEF and wall motion.. Left ventricular ejection fraction,  by estimation, is 25 to 30%. The left ventricle has severely decreased  function. Left ventricular  endocardial border not optimally defined to evaluate regional wall motion.  Left ventricular diastolic parameters are  indeterminate.  2. Right ventricular systolic function is normal. The right ventricular  size is normal.  3. The mitral valve was not well visualized. Mild mitral valve  regurgitation.  4. The aortic valve was not well visualized. Aortic valve regurgitation  is not visualized.  __________  LHC 05/2016:  Ost RCA lesion, 30 %stenosed.  LM lesion, 30 %stenosed.  Ost LAD lesion, 50 %stenosed - looks similar to prior catheterization. FFR 0.87  The left ventricular systolic function is normal.  LV end diastolic pressure is normal.  The left ventricular ejection fraction is 55-65% by visual estimate.  There is mild (2+) mitral regurgitation.  _______CULPRIT LESION_____________  Prox RCA Overlapped BMS-DES stents, 99 % -n-stent re-stenosed.  PTCA with 3.0 mm Scoring  Balloon with Post-dilation using 3.5 mm Empire Balloon was perfromed. Post intervention, there is a 0% residual stenosis.  Recurrent in-stent restenosis of the overlapped stent (BMS and DES) in proximal RCA treated with aggressive angioplasty. Repeat FFR on ostial LAD confirms this is not likely significant.   Plan:  She'll be transferred to 6 [facility] post procedure unit for sheath removal and post PCI care.  Continue aggressive risk factor modification  Continue aspirin and Plavix lifelong  Smoking cessation counseling is mandatory to maintain stent patency __________  LHC 01/2016:  Ost LAD to Prox LAD lesion, 40 %stenosed. Similar to prior cath.  The left ventricular systolic function is normal.  LV end diastolic pressure is normal.  The left ventricular ejection fraction is 55-65% by visual estimate.  There is no aortic valve stenosis.  Prox RCA lesion, 75 %stenosed, in-stent restenosis.  Post intervention with 3.0 x 10 Angiosculpt inflated to 3.5 mm, there is a 0% residual stenosis.  Continue aggressive secondary prevention. Clopidogrel restarted. _________  2D echo 09/2015: - Left  ventricle: The cavity size was normal. Systolic function was  normal. The estimated ejection fraction was in the range of 60%  to 65%. Wall motion was normal; there were no regional wall  motion abnormalities. Doppler parameters are consistent with  abnormal left ventricular relaxation (grade 1 diastolic  dysfunction).  - Right ventricle: Systolic function was normal.  - Pulmonary arteries: Systolic pressure was within the normal  range.   Impressions:   - Normal study.  Patient Profile     75 y.o.femalewith history of CAD with VF arrest s/p PCI to the RCA in 2017 s/p PTCA to the RCA for ISR in 2018, chronic combined systolic and diastolic CHF, COPD with ongoing tobacco use, HTN, HLD, anemia, and narcotic abuse on Suboxone therapy admitted with acute hypoxic respiratory failure requiring mechanical ventilation secondary to COPD exacerbation Klebsiella PNA and acute on chronic combined CHF with transient ST elevation with subsequent rise on HS-Tn to 2455 and echo demonstrating new LV dysfunction requiring augmentation with milrinone for diuresis.Extubated 5/6.  Assessment & Plan    1.  Acute on chronic combined systolic and diastolic CHF:  Admitted April 29 with acute respiratory failure, multifactorial pneumonia, COPD exacerbation, and volume overload. Briefly required milrinone for augmentation of IV diuresis this was discontinued May 2. Extubated May 6. EF 25 to 30% by echo this admission. Lasix held 5/10 secondary to rising BUN, sinus tachycardia, soft BPs.  Feels better this AM - less thirsty.  HRs improved.  BUN down from 73  57.  Creat stable.  Net + 280 yesterday, though wt stable @ 59.3 kg - lowest she's been in past year.  She was able to lie flat last night w/o supplemental O2.  Will cont to hold lasix (was using prn only prior to admission) and arrange for R and L heart cath on Friday 5/13.  The patient understands that risks include but are not limited to stroke (1 in  1000), death (1 in 39), kidney failure [usually temporary] (1 in 500), bleeding (1 in 200), allergic reaction [possibly serious] (1 in 200), and agrees to proceed.  Cont  blocker and entresto.  No MRA 2/2 soft BPs.  Will consider SGLT2i going forward.  2.  CAD/NSTEMI:  H/o CAD s/p multiple RCA interventions - last in 2018.  In setting of PNA w/ resp failure, HsTrop peaked @ 2455. EF 25-30%.  No c/p or dyspnea.  As above, cath on Friday.  Cont  asa, plavix,  blocker, and statin.  3.  Acute resp failure w/ hypoxia/PNA/COPD exacerbation:  Extubated 5/6.  Off of O2 and saturating well.  4.  PAF/SVT:  Rare, brief episodes of SVT on tele.  Cont  blocker.  5.  HL:  Cont statin.  Signed, Murray Hodgkins, NP  09/22/2020, 8:50 AM    For questions or updates, please contact   Please consult [email or web address] for contact info under Cardiology/STEMI.

## 2020-09-22 NOTE — Progress Notes (Signed)
Occupational Therapy Treatment Patient Details Name: Kathryn Garrett MRN: [id number] DOB: [birth year] Today's Date: 09/22/2020    History of present illness 75 y.o. female with history of CAD with VF arrest s/p PCI to the RCA in 2017 s/p PTCA to the RCA for ISR in 2018, chronic combined systolic and diastolic CHF, COPD with ongoing tobacco use, HTN, HLD, anemia, and narcotic abuse on Suboxone therapy admitted with acute hypoxic respiratory failure requiring mechanical ventilation (extubated 09/17/2020) secondary to COPD exacerbation Klebsiella PNA and acute on chronic combined CHF with transient ST elevation and echo demonstrating new LV dysfunction.   OT comments  Pt seen for PT/OT co-tx this date to maximize session benefits d/t low fxl activity tolerance (note, pt HR elevated at rest to 100-115bpm, up to 168 bpm with transfers, RN notified). Pt requires MIN A +2 to come to EOB sitting. Demos F static sitting balance with UE support. Pt requires MIN A +2 for STS trials x3-4 from EOB and ultimately for SPS from EOB to chair. While seated EOB, OT engages pt in UB dressing with MIN A and cues to orient garment. In addition, pt requires MAX A for LB dressing d/t poor dynamic sitting balance and LE strength. Will continue to follow. Continue to anticipate pt will require CIR f/u to reach highest potential with I/ADLs and ADL mobility as pt was completely INDEP prior to hospitalization and is currently requiring 2p assist for mobilization.    Follow Up Recommendations  CIR    Equipment Recommendations  3 in 1 bedside commode;Tub/shower seat    Recommendations for Other Services      Precautions / Restrictions Precautions Precautions: Fall Restrictions Weight Bearing Restrictions: No       Mobility Bed Mobility Overal bed mobility: Needs Assistance Bed Mobility: Supine to Sit     Supine to sit: Min assist;+2 for safety/equipment;HOB elevated Sit to supine: Max assist   General bed  mobility comments: increased time    Transfers Overall transfer level: Needs assistance Equipment used: Rolling walker (2 wheeled) Transfers: Sit to/from [facility] Sit to Stand: Min assist;From elevated surface;+2 safety/equipment Stand pivot transfers: Min assist;+2 physical assistance;+2 safety/equipment       General transfer comment: increased time, stood x3 trials from EOB. Pt requires cues for hand/foot placement in prep to all trials demo'ing poor carryover. Pt requires MIN A +2 to SPS from EOB to recliner.    Balance Overall balance assessment: Needs assistance Sitting-balance support: Feet unsupported;Bilateral upper extremity supported Sitting balance-Leahy Scale: Fair Sitting balance - Comments: able to demonstrate static sitting with constant supervision   Standing balance support: Bilateral upper extremity supported Standing balance-Leahy Scale: Fair Standing balance comment: B UE support on RE throughout, 2p assist to  come to static stand, 1p to sustain                           ADL either performed or assessed with clinical judgement   ADL Overall ADL's : Needs assistance/impaired                 Upper Body Dressing : Minimal assistance;Sitting Upper Body Dressing Details (indicate cue type and reason): to don gown to back side like robe Lower Body Dressing: Maximal assistance;Sitting/lateral leans Lower Body Dressing Details (indicate cue type and reason): to don socks                     Vision Patient Visual  Report: No change from baseline     Perception     Praxis      Cognition Arousal/Alertness: Awake/alert Behavior During Therapy: WFL for tasks assessed/performed Overall Cognitive Status: Impaired/Different from baseline Area of Impairment: Attention;Memory;Safety/judgement;Awareness;Following commands;Problem solving                 Orientation Level: Disoriented to;Time;Situation   Memory:  Decreased short-term memory;Decreased recall of precautions Following Commands: Follows one step commands consistently;Follows one step commands with increased time Safety/Judgement: Decreased awareness of safety;Decreased awareness of deficits Awareness: Intellectual Problem Solving: Slow processing;Decreased initiation;Requires tactile cues;Requires verbal cues General Comments: Pt is alert and conversational but author questions overall cognition. She has poor safety awareness and poor carry-over between task        Exercises Other Exercises Other Exercises: OT engages pt in seated UB/LB dressing tasks. 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 In addition, engaged pt in 1 set x5 reps DF to increase tibialis strrength for prevention of foot drop.   Shoulder Instructions       General Comments educated on ther ex to perform throughout the day to promote strengthenig and return in function    Pertinent Vitals/ Pain       Pain Assessment: No/denies pain  Home Living                                          Prior Functioning/Environment              Frequency  Min 3X/week        Progress Toward Goals  OT Goals(current goals can now be found in the care plan section)  Progress towards OT goals: Progressing toward goals  Acute Rehab OT Goals Patient Stated Goal: get better OT Goal Formulation: With patient/family Time For Goal Achievement: 10/03/20 Potential to Achieve Goals: Good  Plan Discharge plan remains appropriate;Frequency needs to be updated    Co-evaluation    PT/OT/SLP Co-Evaluation/Treatment: Yes Reason for Co-Treatment: Complexity of the patient's impairments (multi-system involvement) PT goals addressed during session: Mobility/safety with mobility OT goals addressed during session: ADL's and self-care      AM-PAC OT "6  Clicks" Daily Activity     Outcome Measure   Help from another person eating meals?: A Little Help from another person taking care of personal grooming?: A Little Help from another person toileting, which includes using toliet, bedpan, or urinal?: A Lot Help from another person bathing (including washing, rinsing, drying)?: A Lot Help from another person to put on and taking off regular upper body clothing?: A Little Help from another person to put on and taking off regular lower body clothing?: A Lot 6 Click Score: 15    End of Session Equipment Utilized During Treatment: Gait belt;Rolling walker  OT Visit Diagnosis: Unsteadiness on feet (R26.81);Muscle weakness (generalized) (M62.81);Other symptoms and signs involving cognitive function   Activity Tolerance Patient tolerated treatment well   Patient Left in chair;with call bell/phone within reach;with family/visitor present   Nurse Communication Mobility status        Time: 1510-1535 OT Time Calculation (min): 25 min  Charges: OT General Charges $OT Visit: 1 Visit OT Treatments $Self Care/Home Management : 8-22 mins  Gerrianne Scale, [place], OTR/L ascom [phone] 09/22/20, 6:23 PM

## 2020-09-23 DIAGNOSIS — J189 Pneumonia, unspecified organism: Secondary | ICD-10-CM | POA: Diagnosis not present

## 2020-09-23 DIAGNOSIS — R0902 Hypoxemia: Secondary | ICD-10-CM | POA: Diagnosis not present

## 2020-09-23 DIAGNOSIS — J441 Chronic obstructive pulmonary disease with (acute) exacerbation: Secondary | ICD-10-CM | POA: Diagnosis not present

## 2020-09-23 DIAGNOSIS — R0603 Acute respiratory distress: Secondary | ICD-10-CM | POA: Diagnosis not present

## 2020-09-23 LAB — BASIC METABOLIC PANEL
Anion gap: 10 (ref 5–15)
BUN: 44 mg/dL — ABNORMAL HIGH (ref 8–23)
CO2: 23 mmol/L (ref 22–32)
Calcium: 8.7 mg/dL — ABNORMAL LOW (ref 8.9–10.3)
Chloride: 107 mmol/L (ref 98–111)
Creatinine, Ser: 0.51 mg/dL (ref 0.44–1.00)
GFR, Estimated: >60 mL/min (ref >60)
Glucose, Bld: 98 mg/dL (ref 70–99)
Potassium: 3.9 mmol/L (ref 3.5–5.1)
Sodium: 140 mmol/L (ref 135–145)

## 2020-09-23 LAB — CBC WITH DIFFERENTIAL/PLATELET
Abs Immature Granulocytes: 0.09 10*3/uL — ABNORMAL HIGH (ref 0.00–0.07)
Basophils Absolute: 0.1 10*3/uL (ref 0.0–0.1)
Basophils Relative: 0 %
Eosinophils Absolute: 0.1 10*3/uL (ref 0.0–0.5)
Eosinophils Relative: 1 %
HCT: 40.1 % (ref 36.0–46.0)
Hemoglobin: 12.3 g/dL (ref 12.0–15.0)
Immature Granulocytes: 1 %
Lymphocytes Relative: 28 %
Lymphs Abs: 5.4 10*3/uL — ABNORMAL HIGH (ref 0.7–4.0)
MCH: 30.3 pg (ref 26.0–34.0)
MCHC: 30.7 g/dL (ref 30.0–36.0)
MCV: 98.8 fL (ref 80.0–100.0)
Monocytes Absolute: 0.8 10*3/uL (ref 0.1–1.0)
Monocytes Relative: 4 %
Neutro Abs: 13.1 10*3/uL — ABNORMAL HIGH (ref 1.7–7.7)
Neutrophils Relative %: 66 %
Platelets: 263 10*3/uL (ref 150–400)
RBC: 4.06 MIL/uL (ref 3.87–5.11)
RDW: 15.6 % — ABNORMAL HIGH (ref 11.5–15.5)
WBC: 19.5 10*3/uL — ABNORMAL HIGH (ref 4.0–10.5)
nRBC: 0 % (ref 0.0–0.2)

## 2020-09-23 LAB — GLUCOSE, CAPILLARY
Glucose-Capillary: 106 mg/dL — ABNORMAL HIGH (ref 70–99)
Glucose-Capillary: 98 mg/dL (ref 70–99)
Glucose-Capillary: 121 mg/dL — ABNORMAL HIGH (ref 70–99)
Glucose-Capillary: 128 mg/dL — ABNORMAL HIGH (ref 70–99)
Glucose-Capillary: 120 mg/dL — ABNORMAL HIGH (ref 70–99)
Glucose-Capillary: 107 mg/dL — ABNORMAL HIGH (ref 70–99)

## 2020-09-23 MED ORDER — PREDNISONE 10 MG PO TABS
10.0000 mg | ORAL_TABLET | Freq: Every day | ORAL | Status: AC
  Administered 2020-09-24: 10 mg via ORAL
  Filled 2020-09-23: qty 1

## 2020-09-23 NOTE — Progress Notes (Signed)
Inpatient Rehabilitation Admissions Coordinator  I spoke with daughter, Amy and her Mom by phone at patient's bedside. I again discussed goals and expectations of a possible CIR admit. They prefer [facility]/inpt rehab rather than [facility] in [place]. I cannot begin authorization with [employer] until medical workup complete/cardiac cath Friday. I will follow up tomorrow.  Danne Baxter, RN, MSN Rehab Admissions Coordinator [phone] 09/23/2020 9:59 AM

## 2020-09-23 NOTE — Progress Notes (Signed)
Nutrition Follow Up Note   DOCUMENTATION CODES:   Obesity unspecified  INTERVENTION:   Nepro Shake po TID, each supplement provides 425 kcal and 19 grams protein  Magic cup TID with meals, each supplement provides 290 kcal and 9 grams of protein  MVI po daily   NUTRITION DIAGNOSIS:   Inadequate oral intake related to inability to eat (pt sedated and ventilated) as evidenced by NPO status.  GOAL:   Patient will meet greater than or equal to 90% of their needs  -progressing   MONITOR:   PO intake,Supplement acceptance,Labs,Weight trends,Skin,I & O's  ASSESSMENT:   75 y/o female with h/o COPD, HTN, NSTEMI, CHF and lung mass who is admitted with PNA and possible acute a stroke involving left MCA territory   Met with pt and pt's two daughters in room today. Family reports pt with improving appetite and oral intake in hospital. Pt reports eating well eggs and sausage for breakfast this morning. Pt documented to have eaten 50% of her lunch. Family requesting to bring food from outside the hospital; recommended no outside food until patient advances to a regular diet. Pt reports that she likes the Nepro ok; pt prefers the mixed berry flavor and drank 1/2 of a Nepro while RD was in the room today. RD discussed with patient and family the importance of adequate nutrition needed for recovery and to preserve lean muscle. Family will encourage pt to drink the supplements. Pt is enjoying the Magic Cups on her meal trays. Plan is for possible CIR at discharge.   Per chart, pt is down ~17lbs since admit. Pt is currently down ~11lbs(8%) from her UBW.   Medications reviewed and include: aspirin, plavix, lovenox, insulin. Synthroid, MVI, protonix, prednisone  Labs reviewed: BUN 44(H) Wbc- 19.5(H) cbgs- 106, 98, 121 x 24 hrs  Diet Order:   Diet Order            DIET DYS 2 Room service appropriate? Yes with Assist; Fluid consistency: Nectar Thick  Diet effective now                 EDUCATION NEEDS:   No education needs have been identified at this time  Skin:  Skin Assessment: Reviewed RN Assessment  Last BM:  5/11- type 5  Height:   Ht Readings from Last 1 Encounters:  09/15/20 $RemoveB'4\' 11"'xOfURwPd$  (1.499 m)    Weight:   Wt Readings from Last 1 Encounters:  09/23/20 56 kg    Ideal Body Weight:  44.5 kg  BMI:  Body mass index is 24.94 kg/m.  Estimated Nutritional Needs:   Kcal:  1400-1600kcal/day  Protein:  70-80g/day  Fluid:  1.4L/day  [name] MS, RD, LDN Please refer to [facility] for RD and/or RD on-call/weekend/after hours pager

## 2020-09-23 NOTE — Progress Notes (Signed)
Speech Language Pathology Treatment: Dysphagia  Patient Details Name: Kathryn Garrett MRN: [id number] DOB: Nov 06, 1945 Today's Date: 09/23/2020 Time: 1210-1300 SLP Time Calculation (min) (ACUTE ONLY): 50 min  Assessment / Plan / Recommendation Clinical Impression  Pt seen for ongoing assessment of swallowing.Sheappears to improve a little each day; sitting up in bed; alert. Daughters present in room w/ pt. Pt quite verbally responsive and talkative; followedinstructions w/ min cues -- easily distracted and weak UEs bilat. Pt has been orally extubated since 09/17/2020. NG removed post ST session yesterday.Pt is on 2L [place] O2 support; noted wbc strikingly elevated(?).Noted per NP notes, Echocardiogram this admission showed severely reduced ejection fraction, EF 25 to 30%.Pt is eager to drink coffee; NSG provided some Nectar consistency earlier. Noted min increased Congested Cough this session. Pt/Family explained general aspiration precautions and agreed verbally to the need for following them especially sitting upright for all oral intake, little to No Talking during oral intake,and taking Small, Single sips(min cues needed during trials). Pt held Cup and fed self trials ofice chips,thin liquids.Overt,clinical s/s of aspiration were noted w/ trials of ice chips and thin liquids c/b a delayed cough fairly consistently w/ trials -- ~2/5 trials w/ thin liquids w/ this SLP. Respiratory status remained calm and unlabored, vocal quality clear b/t trials, but O2 sats remained upper 80s-90%. NSG aware. Pt helped to hold Cup when drinking following instructions for single, small sips slowly-- quite weak UEs bilat.Oral phase appeared grossly [facility] for bolus management and timely A-P transfer for swallowing; oral clearing achieved. Pt is unable to wear her Dentures d/t ill-fit and discomfort for any further upgrade of solid foods.  Discussed pt's presentation w/ Family, NSG, and MD; recommended remaining  on Nectar consistency liquids d/t overt clinical s/s of aspiration and risk for Pulmonary decline from aspiration pneumonia. Recommend diet of a Dysphagia level2diet (MINCED foods w/ gravies added to moisten foods;Nectarliquids, cup/straw. Recommend general aspiration precautions; PillsWhole in Puree; tray setup,positioning assistance, and feeding assistancefor meals d/t UE weakness and ease of Distraction/mental status. ST services will continue to f/u w/ pt for toleration of diet, trials to upgrade liquid consistency next 1-2 days Post pt's procedure w/ Cardiology tomorrow, w/ possible need for MBSS for objective assessment,and education while admitted. MD/NSG updated, agreed. Precautions posted at bedside. Educationgiven to pt/Familyon dysphagia/swallowing; general aspiration precautions; food and liquid consistencies; education on dysphagia; aspiration and its consequences. Family agreed.     HPI HPI: Pt is a 75 yo female admitted for COPD exacerbation and left lower lobe pneumonia. Tracheal aspirate from 4/30 with KLEBSIELLA PNEUMONIAE (resistant to Ampicillin). Head CT 09/17/2020 No acute intracranial abnormality.Past medical history including HRrEF, tobacco abuse, CAD s/p stenting, CHF, COPD who is admitted with COPD exacerbation / pneumonia / decompensated heart failure / NSTEMI.  Pt intubated 09/11/2020 thru 09/17/2020. Extubated then w/ NG placed for meds, po's.      SLP Plan  Continue with current plan of care (mbss TBD)       Recommendations  Diet recommendations: Dysphagia 2 (fine chop);Nectar-thick liquid Liquids provided via: Cup;No straw Medication Administration: Whole meds with puree (vs Crushed if needed per NSG) Supervision: Patient able to self feed;Staff to assist with self feeding;Intermittent supervision to cue for compensatory strategies Compensations: Minimize environmental distractions;Slow rate;Small sips/bites;Lingual sweep for clearance of pocketing;Follow  solids with liquid Postural Changes and/or Swallow Maneuvers: Out of bed for meals;Seated upright 90 degrees;Upright 30-60 min after meal  General recommendations:  (dietician f/u) Oral Care Recommendations: Oral care BID;Oral care before and after PO;Staff/trained caregiver to provide oral care Follow up Recommendations:  (TBD) SLP Visit Diagnosis: Dysphagia, pharyngeal phase (R13.13) Plan: Continue with current plan of care (mbss TBD)       GO                 [name], MS, CCC-SLP Speech Language Pathologist Rehab Services [phone] [facility] 09/23/2020, 6:22 PM

## 2020-09-23 NOTE — Progress Notes (Signed)
Progress Note  Patient Name: Kathryn Garrett Date of Encounter: 09/23/2020  Primary Cardiologist: Larae Grooms, MD   Subjective   No CP or SOB.  Discussed timing of her catheterization. She is preferring tomorrow (09/24/20) over later today, given she knows she will have to be NPO.   We again reviewed the risks and benefits.  Inpatient Medications    Scheduled Meds: . aspirin  81 mg Oral QHS  . budesonide (PULMICORT) nebulizer solution  0.25 mg Nebulization BID  . buprenorphine-naloxone  2 tablet Sublingual Daily  . Chlorhexidine Gluconate Cloth  6 each Topical QHS  . clopidogrel  75 mg Oral QHS  . enoxaparin (LOVENOX) injection  40 mg Subcutaneous Q24H  . feeding supplement (NEPRO CARB STEADY)  237 mL Oral TID BM  . fluticasone  1 spray Each Nare Daily  . insulin aspart  0-15 Units Subcutaneous Q4H  . ipratropium-albuterol  3 mL Nebulization TID  . levothyroxine  88 mcg Oral Q0600  . loratadine  10 mg Oral Daily  . mouth rinse  15 mL Mouth Rinse BID  . metoprolol succinate  25 mg Oral Daily  . multivitamin with minerals  1 tablet Oral Daily  . pantoprazole  40 mg Oral Daily  . predniSONE  20 mg Oral Q breakfast  . rosuvastatin  20 mg Oral QHS  . sacubitril-valsartan  1 tablet Oral BID  . sodium chloride flush  3 mL Intravenous Q12H   Continuous Infusions: . sodium chloride 10 mL/hr at 09/17/20 0700   PRN Meds: acetaminophen **OR** acetaminophen, albuterol, artificial tears, docusate, guaiFENesin, metoprolol tartrate, morphine injection, polyethylene glycol   Vital Signs    Vitals:   09/23/20 0300 09/23/20 0400 09/23/20 0500 09/23/20 0600  BP: (!) 105/37 (!) 104/38 (!) 105/38 (!) 115/45  Pulse: 67 71 68 80  Resp: 15 18    Temp:      TempSrc:      SpO2: 91% 97% 95% 98%  Weight:   56 kg   Height:        Intake/Output Summary (Last 24 hours) at 09/23/2020 0813 Last data filed at 09/23/2020 0600 Gross per 24 hour  Intake 483 ml  Output 850 ml  Net  -367 ml   Last 3 Weights 09/23/2020 09/22/2020 09/21/2020  Weight (lbs) 123 lb 7.3 oz 130 lb 11.7 oz 131 lb 9.8 oz  Weight (kg) 56 kg 59.3 kg 59.7 kg      Telemetry    NSR, 70-90s - Personally Reviewed  ECG    No new tracings- Personally Reviewed  Physical Exam   GEN: No acute distress.   Neck: No JVD Cardiac: RRR, 1/6 systolic murmur, no rubs or gallops.  Respiratory: Clear to auscultation bilaterally. GI: Soft, nontender, non-distended  MS: No edema; No deformity. Neuro:  Nonfocal  Psych: Normal affect   Labs    High Sensitivity Troponin:   Recent Labs  Lab 09/10/20 2050 09/11/20 0437 09/11/20 1433 09/11/20 1611 09/11/20 2011  TROPONINIHS 1,838* 2,434* 2,265* 2,455* 2,317*      Chemistry Recent Labs  Lab 09/21/20 0206 09/22/20 0418 09/23/20 0426  NA 147* 146* 140  K 3.7 4.4 3.9  CL 112* 112* 107  CO2 25 25 23   GLUCOSE 127* 98 98  BUN 73* 57* 44*  CREATININE 0.65 0.56 0.51  CALCIUM 9.4 9.5 8.7*  GFRNONAA >60 >60 >60  ANIONGAP 10 9 10      Hematology Recent Labs  Lab 09/21/20 0206 09/22/20 0418 09/23/20 0426  WBC 31.7* 19.3* 19.5*  RBC 4.86 4.38 4.06  HGB 14.6 13.5 12.3  HCT 48.7* 44.3 40.1  MCV 100.2* 101.1* 98.8  MCH 30.0 30.8 30.3  MCHC 30.0 30.5 30.7  RDW 16.2* 16.4* 15.6*  PLT 331 279 263    BNPNo results for input(s): BNP, PROBNP in the last 168 hours.   DDimer No results for input(s): DDIMER in the last 168 hours.   Radiology    No results found.  Cardiac Studies   2D echo 09/11/2020: 1. Recommend limited echo with iv contrast agent (definity) for addequate  assessment of LVEF and wall motion.. Left ventricular ejection fraction,  by estimation, is 25 to 30%. The left ventricle has severely decreased  function. Left ventricular  endocardial border not optimally defined to evaluate regional wall motion.  Left ventricular diastolic parameters are indeterminate.  2. Right ventricular systolic function is normal. The right  ventricular  size is normal.  3. The mitral valve was not well visualized. Mild mitral valve  regurgitation.  4. The aortic valve was not well visualized. Aortic valve regurgitation  is not visualized.  __________  LHC 05/2016:  Ost RCA lesion, 30 %stenosed.  LM lesion, 30 %stenosed.  Ost LAD lesion, 50 %stenosed - looks similar to prior catheterization. FFR 0.87  The left ventricular systolic function is normal.  LV end diastolic pressure is normal.  The left ventricular ejection fraction is 55-65% by visual estimate.  There is mild (2+) mitral regurgitation.  _______CULPRIT LESION_____________  Prox RCA Overlapped BMS-DES stents, 99 % -n-stent re-stenosed.  PTCA with 3.0 mm Scoring Balloon with Post-dilation using 3.5 mm Advance Balloon was perfromed. Post intervention, there is a 0% residual stenosis.  Recurrent in-stent restenosis of the overlapped stent (BMS and DES) in proximal RCA treated with aggressive angioplasty. Repeat FFR on ostial LAD confirms this is not likely significant.   Plan:  She'll be transferred to 6 Central post procedure unit for sheath removal and post PCI care.  Continue aggressive risk factor modification  Continue aspirin and Plavix lifelong  Smoking cessation counseling is mandatory to maintain stent patency __________  LHC 01/2016:  Ost LAD to Prox LAD lesion, 40 %stenosed. Similar to prior cath.  The left ventricular systolic function is normal.  LV end diastolic pressure is normal.  The left ventricular ejection fraction is 55-65% by visual estimate.  There is no aortic valve stenosis.  Prox RCA lesion, 75 %stenosed, in-stent restenosis.  Post intervention with 3.0 x 10 Angiosculpt inflated to 3.5 mm, there is a 0% residual stenosis.  Continue aggressive secondary prevention. Clopidogrel restarted. _________  2D echo 09/2015: - Left ventricle: The cavity size was normal. Systolic function was  normal. The  estimated ejection fraction was in the range of 60%  to 65%. Wall motion was normal; there were no regional wall  motion abnormalities. Doppler parameters are consistent with  abnormal left ventricular relaxation (grade 1 diastolic  dysfunction).  - Right ventricle: Systolic function was normal.  - Pulmonary arteries: Systolic pressure was within the normal  range.   Impressions:   - Normal study.  Patient Profile     75 y.o. female with history of CAD with VF arrest s/p PCI to the RCA in 2017 s/p PTCA to the RCA for ISR in 2018, chronic combined systolic and diastolic CHF, COPD with ongoing tobacco use, HTN, HLD, anemia, and narcotic abuse on Suboxone therapy admitted with acute hypoxic respiratory failure requiring mechanical ventilation secondary to COPD exacerbation Klebsiella PNA  and acute on chronic combined CHF with transient ST elevation with subsequent rise on HS-Tn to 2455 and echo demonstrating new LV dysfunction requiring augmentation with milrinone for diuresis.Extubated 5/6. Seen today prior to tentative plan for cardiac catheterization 09/24/20.  Assessment & Plan    Acute on chronic combined systolic and diastolic heart failure -- No shortness of breath.  Appears euvolemic on exam.  --EF 25-30% by 4/30 echo. --Plan for [facility] 5/13. Verbal consent obtained.  --Net -367.0cc and net +1.6L for the admission. --Wt 59.3kg  56.0 kg. --Daily BMET. Renal function stable with Cr 0.51 and BUN. --Holding lasix. Previously on PTA PRN lasix. --Continue Toprol with rates well controlled. --Continue Entresto 24-26 twice daily as BP/Cr allows .  --No MRA 2/2 soft pressures.  --Consider SGLT2i now or as an OP. --NPO after midnight for [facility] tomorrow 5/13.  Shared Decision Making/Informed Consent The risks [stroke (1 in 1000), death (1 in 1000), kidney failure [usually temporary] (1 in 500), bleeding (1 in 200), allergic reaction [possibly serious] (1 in 200)], benefits  (diagnostic support and management of coronary artery disease) and alternatives of a cardiac catheterization were discussed in detail with Ms. Grandville Silos and she is willing to proceed.  NSTEMI with history of PCI to RCA --No CP. S/p multiple interventions on review of EMR.  --HS Tn peaked 2,455. --Continue ASA, Plavix, Toprol, statin. --LHC tomorrow 09/24/20.  Acute respiratory failure with hypoxia/pneumonia/COPD exacerbation -- Extubated 5/6 -- Breathing well off of oxygen.  During our conversation today, O2 sats dropped into the 80s briefly when increased back into the 90s for the rest of our conversation. --Caution with albuterol as may exacerbate episodes of SVT.  Can ask.  PAF/SVT -- Review of telemetry shows resolution of earlier brief episodes of SVT.  Continue Toprol.  HLD -- Continue statin.  DM2 --SSI, per IM.  Hypothyroidism --Continue Synthroid.  For questions or updates, please contact [place] Please consult [email or web address] for contact info under        Signed, Arvil Chaco, PA-C  09/23/2020, 8:13 AM

## 2020-09-23 NOTE — Progress Notes (Signed)
Physical Therapy Treatment Patient Details Name: Kathryn Garrett MRN: [id number] DOB: 1945/08/02 Today's Date: 09/23/2020    History of Present Illness 75 y.o. female with history of CAD with VF arrest s/p PCI to the RCA in 2017 s/p PTCA to the RCA for ISR in 2018, chronic combined systolic and diastolic CHF, COPD with ongoing tobacco use, HTN, HLD, anemia, and narcotic abuse on Suboxone therapy admitted with acute hypoxic respiratory failure requiring mechanical ventilation (extubated 09/17/2020) secondary to COPD exacerbation Klebsiella PNA and acute on chronic combined CHF with transient ST elevation and echo demonstrating new LV dysfunction.    PT Comments    Pt was long sitting in bed with daughters present upon arriving. Needed a little encouragement however was cooperative and pleasant once agreeable. She required only +1 assistance today. Was able to exit R side of bed, stand ~ 4 x and took steps from FOB to [facility]. HR elevation to 140s. Previous date, HR elevated to 170bpm. Overall pt is progressing. Planned heart cath tomorrow. Acute PT will continue to follow and progress as able per POC. Pt will greatly benefit from CIR at DC to address deficits while assisting pt to PLOF.   Follow Up Recommendations  CIR     Equipment Recommendations  None recommended by PT       Precautions / Restrictions Precautions Precautions: Fall Restrictions Weight Bearing Restrictions: No    Mobility  Bed Mobility Overal bed mobility: Needs Assistance Bed Mobility: Supine to Sit     Supine to sit: Min assist;HOB elevated;Mod assist Sit to supine: Min guard   General bed mobility comments: Pt was able to exit R side of bed with less ssistance. +1 assist for all mobility, transfers, and ambulation    Transfers Overall transfer level: Needs assistance Equipment used: Rolling walker (2 wheeled) Transfers: Sit to/from Stand Sit to Stand: Min assist;Mod assist;From elevated surface          General transfer comment: Pt stood EOB 4 x total. Used RW for UE support. less severe posterior lean/LOB but still slightly posterior. Vcs for fwd wt shift and relaxation in standing.  Ambulation/Gait Ambulation/Gait assistance: Min assist Gait Distance (Feet): 4 Feet Assistive device: Rolling walker (2 wheeled) Gait Pattern/deviations: Step-to pattern Gait velocity: decreased   General Gait Details: pt was easily able to side step from FOB to [facility].     Balance Overall balance assessment: Needs assistance Sitting-balance support: Feet unsupported;Bilateral upper extremity supported Sitting balance-Leahy Scale: Good     Standing balance support: Bilateral upper extremity supported;During functional activity Standing balance-Leahy Scale: Fair       Cognition Arousal/Alertness: Awake/alert Behavior During Therapy: WFL for tasks assessed/performed Overall Cognitive Status: Impaired/Different from baseline    Orientation Level: Disoriented to;Time     Following Commands: Follows one step commands consistently;Follows multi-step commands with increased time Safety/Judgement: Decreased awareness of safety;Decreased awareness of deficits     General Comments: Pt was alert but disoriented to time. much improved cognition from previous date. was able to follow commands better with better carry-over between desired task.         General Comments General comments (skin integrity, edema, etc.): reviewed importance of performing ther ex throughout the day      Pertinent Vitals/Pain Pain Assessment: No/denies pain Pain Score: 0-No pain Faces Pain Scale: No hurt           PT Goals (current goals can now be found in the care plan section) Acute Rehab PT Goals Patient Stated  Goal: get better so I can get home Progress towards PT goals: Progressing toward goals    Frequency    7X/week      PT Plan Current plan remains appropriate    Co-evaluation     PT goals  addressed during session: Mobility/safety with mobility;Balance;Proper use of DME;Strengthening/ROM        AM-PAC PT "6 Clicks" Mobility   Outcome Measure  Help needed turning from your back to your side while in a flat bed without using bedrails?: A Little Help needed moving from lying on your back to sitting on the side of a flat bed without using bedrails?: A Lot Help needed moving to and from a bed to a chair (including a wheelchair)?: A Lot Help needed standing up from a chair using your arms (e.g., wheelchair or bedside chair)?: A Lot Help needed to walk in hospital room?: A Lot Help needed climbing 3-5 steps with a railing? : Total 6 Click Score: 12    End of Session Equipment Utilized During Treatment: Gait belt Activity Tolerance: Patient tolerated treatment well Patient left: in bed;with call bell/phone within reach;with bed alarm set;with nursing/sitter in room;with family/visitor present Nurse Communication: Mobility status PT Visit Diagnosis: Difficulty in walking, not elsewhere classified (R26.2);Muscle weakness (generalized) (M62.81);Unsteadiness on feet (R26.81)     Time: 9147-8295 PT Time Calculation (min) (ACUTE ONLY): 23 min  Charges:  $Therapeutic Activity: 23-37 mins                     Julaine Fusi PTA 09/23/20, 3:56 PM

## 2020-09-23 NOTE — Plan of Care (Addendum)
Patient with bed in chair position most of shift, also got OOB with phys therapist.  Oral intake improving.  Daughters at beside most of shift.  Patient A&O x 4, easily reoriented to time if she forgets, VSS on room air. Plan is for [facility] tomorrow AM, NPO after midnight tonight

## 2020-09-23 NOTE — Progress Notes (Addendum)
PROGRESS NOTE    Kathryn Garrett  VOJ:[id number] DOB: 1945/09/05 DOA: 09/10/2020 PCP: Imagene Riches, NP  Brief Narrative:  74/F presented to the [facility] ED from home with complaints of shortness of breath -Admitted on 4/29 with hypoxic respiratory failure secondary to COPD exacerbation and community-acquired pneumonia -Hospital course was complicated by flash pulmonary edema and NSTEMI requiring mechanical ventilation, intubated on 4/30 -Treated with antibiotics, steroids and diuretics -Eventually extubated on 5/7 [facility] course further complicated by encephalopathy -Cardiology following, planning left heart catheterization when respiratory and mental status is stable   Assessment & Plan:    Acute hypoxic respiratory failure  Acute COPD Exacerbation/left lung multifocal pneumonia Flash pulmonary edema on 4/30 -Intubated for flash pulmonary edema on, extubated 5/7 -Clinically improving, completed antibiotic course -Treated with diuretics as well -Improved and stable, weaned off O2  Sepsis, left lower lobe pneumonia -tracheal aspirate on 4/30 with KLEBSIELLA PNEUMONIAE (resistant to Ampicillin) -Completed 7 days of antibiotics. -Improving, leukocytosis trending down  Acute systolic congestive heart failure/Flash pulmonary edema NSTEMI New diagnosis of A. fib with RVR -Echo noted with drop in EF down to 30%  -Previously on inotropes in ICU, weaned off, also treated with IV heparin briefly  -Cardiology following, continue metoprolol,  Entresto, appears euvolemic now, diuretics on hold -Also on aspirin Plavix -Cardiology following, plan for left heart cath tomorrow  Acute metabolic/toxic encephalopathy -ICU stay complicated by severe encephalopathy while on vent -At baseline on buprenorphine/naloxone which was held on admission, now resumed -Also had an MRI brain which was unremarkable -Improved and stable now, PT OT as tolerated  COPD Ongoing tobacco  abuse -Counseled, taper off prednisone, continue duo nebs  DVT prophylaxis: Lovenox Code Status: Full Family Communication: Daughters at bedside Disposition Plan: Status is: Inpatient  Remains inpatient appropriate because:Inpatient level of care appropriate due to severity of illness   Dispo: The patient is from: Home              Anticipated d/c is to: SNF versus CIR              Patient currently is not medically stable to d/c.   Difficult to place patient No Consultants:   Cardiology   Procedures: Endotracheal intubation  Antimicrobials:  None   Subjective: -Feels better overall, breathing is improving, mind continues to clear up  Objective: Vitals:   09/23/20 1000 09/23/20 1100 09/23/20 1200 09/23/20 1300  BP: (!) 105/40 (!) 101/44 (!) 114/43 (!) 110/40  Pulse: 97 91 83 86  Resp:   20   Temp:   (!) 97.5 F (36.4 C)   TempSrc:   Axillary   SpO2: 94% 91% (!) 89% (!) 87%  Weight:      Height:        Intake/Output Summary (Last 24 hours) at 09/23/2020 1459 Last data filed at 09/23/2020 1200 Gross per 24 hour  Intake 714 ml  Output 350 ml  Net 364 ml   Filed Weights   09/21/20 0500 09/22/20 0500 09/23/20 0500  Weight: 59.7 kg 59.3 kg 56 kg    Examination:  General exam: Chronically ill pleasant female sitting up in bed, AAOx3, no distress CVS: S1-S2, regular rate rhythm Lungs: Few basilar scattered rhonchi, otherwise clear Abdomen: Soft, nontender, bowel sounds present Extremities: No edema Skin: No rashes on exposed skin  Psychiatry: Appropriate mood and affect   Data Reviewed: I have personally reviewed following labs and imaging studies  CBC: Recent Labs  Lab 09/19/20 0418 09/20/20 0356 09/21/20 0206  09/22/20 0418 09/23/20 0426  WBC 28.5* 20.1* 31.7* 19.3* 19.5*  NEUTROABS  --  12.2* 24.6* 12.3* 13.1*  HGB 14.3 14.6 14.6 13.5 12.3  HCT 46.5* 48.3* 48.7* 44.3 40.1  MCV 97.3 99.6 100.2* 101.1* 98.8  PLT 361 335 331 279 735   Basic  Metabolic Panel: Recent Labs  Lab 09/18/20 0550 09/19/20 0418 09/20/20 0356 09/21/20 0206 09/22/20 0418 09/23/20 0426  NA 139 143 145 147* 146* 140  K 3.3* 5.3* 3.0* 3.7 4.4 3.9  CL 101 106 107 112* 112* 107  CO2 27 22 25 25 25 23   GLUCOSE 92 89 124* 127* 98 98  BUN 52* 67* 68* 73* 57* 44*  CREATININE 0.51 0.66 0.66 0.65 0.56 0.51  CALCIUM 9.0 9.3 9.1 9.4 9.5 8.7*  MG 2.3 2.5* 2.7* 2.5* 2.5*  --   PHOS 3.5 4.4 4.2 3.6 3.5  --    GFR: Estimated Creatinine Clearance: 46.3 mL/min (by C-G formula based on SCr of 0.51 mg/dL). Liver Function Tests: No results for input(s): AST, ALT, ALKPHOS, BILITOT, PROT, ALBUMIN in the last 168 hours. No results for input(s): LIPASE, AMYLASE in the last 168 hours. No results for input(s): AMMONIA in the last 168 hours. Coagulation Profile: No results for input(s): INR, PROTIME in the last 168 hours. Cardiac Enzymes: No results for input(s): CKTOTAL, CKMB, CKMBINDEX, TROPONINI in the last 168 hours. BNP (last 3 results) No results for input(s): PROBNP in the last 8760 hours. HbA1C: No results for input(s): HGBA1C in the last 72 hours. CBG: Recent Labs  Lab 09/22/20 1952 09/22/20 2335 09/23/20 0343 09/23/20 0733 09/23/20 1121  GLUCAP 110* 105* 106* 98 121*   Lipid Profile: No results for input(s): CHOL, HDL, LDLCALC, TRIG, CHOLHDL, LDLDIRECT in the last 72 hours. Thyroid Function Tests: No results for input(s): TSH, T4TOTAL, FREET4, T3FREE, THYROIDAB in the last 72 hours. Anemia Panel: No results for input(s): VITAMINB12, FOLATE, FERRITIN, TIBC, IRON, RETICCTPCT in the last 72 hours. Sepsis Labs: No results for input(s): PROCALCITON, LATICACIDVEN in the last 168 hours.  Recent Results (from the past 240 hour(s))  Culture, Respiratory w Gram Stain     Status: None   Collection Time: 09/16/20  8:35 AM   Specimen: Tracheal Aspirate; Respiratory  Result Value Ref Range Status   Specimen Description   Final    TRACHEAL  ASPIRATE Performed at [facility], [address], [place], [place] [address]    Special Requests   Final    NONE Performed at [facility], [address] [place], [place] [address]    Gram Stain   Final    ABUNDANT WBC PRESENT, PREDOMINANTLY PMN FEW YEAST Performed at [facility], [place] [address], [place],  [address]    Culture FEW CANDIDA ALBICANS  Final   Report Status 09/19/2020 FINAL  Final     Scheduled Meds: . aspirin  81 mg Oral QHS  . budesonide (PULMICORT) nebulizer solution  0.25 mg Nebulization BID  . buprenorphine-naloxone  2 tablet Sublingual Daily  . Chlorhexidine Gluconate Cloth  6 each Topical QHS  . clopidogrel  75 mg Oral QHS  . enoxaparin (LOVENOX) injection  40 mg Subcutaneous Q24H  . feeding supplement (NEPRO CARB STEADY)  237 mL Oral TID BM  . fluticasone  1 spray Each Nare Daily  . insulin aspart  0-15 Units Subcutaneous Q4H  . ipratropium-albuterol  3 mL Nebulization TID  . levothyroxine  88 mcg Oral Q0600  . loratadine  10  mg Oral Daily  . mouth rinse  15 mL Mouth Rinse BID  . metoprolol succinate  25 mg Oral Daily  . multivitamin with minerals  1 tablet Oral Daily  . pantoprazole  40 mg Oral Daily  . predniSONE  20 mg Oral Q breakfast  . rosuvastatin  20 mg Oral QHS  . sacubitril-valsartan  1 tablet Oral BID  . sodium chloride flush  3 mL Intravenous Q12H   Continuous Infusions: . sodium chloride 10 mL/hr at 09/17/20 0700     LOS: 13 days    Time spent: 25 minutes  Domenic Polite, MD [facility]  09/23/2020, 2:59 PM

## 2020-09-24 ENCOUNTER — Encounter
Admission: EM | Disposition: A | Discharge status: Home Health | Payer: Self-pay | Source: Home / Self Care | Attending: Internal Medicine

## 2020-09-24 ENCOUNTER — Encounter: Payer: Self-pay | Admitting: Internal Medicine

## 2020-09-24 DIAGNOSIS — I2510 Atherosclerotic heart disease of native coronary artery without angina pectoris: Secondary | ICD-10-CM | POA: Diagnosis not present

## 2020-09-24 DIAGNOSIS — I5023 Acute on chronic systolic (congestive) heart failure: Secondary | ICD-10-CM | POA: Diagnosis not present

## 2020-09-24 DIAGNOSIS — I214 Non-ST elevation (NSTEMI) myocardial infarction: Secondary | ICD-10-CM | POA: Diagnosis not present

## 2020-09-24 DIAGNOSIS — J441 Chronic obstructive pulmonary disease with (acute) exacerbation: Secondary | ICD-10-CM | POA: Diagnosis not present

## 2020-09-24 DIAGNOSIS — J9601 Acute respiratory failure with hypoxia: Secondary | ICD-10-CM | POA: Diagnosis not present

## 2020-09-24 HISTORY — PX: RIGHT/LEFT HEART CATH AND CORONARY ANGIOGRAPHY: CATH118266

## 2020-09-24 LAB — CBC WITH DIFFERENTIAL/PLATELET
Abs Immature Granulocytes: 0.09 10*3/uL — ABNORMAL HIGH (ref 0.00–0.07)
Basophils Absolute: 0 10*3/uL (ref 0.0–0.1)
Basophils Relative: 0 %
Eosinophils Absolute: 0.1 10*3/uL (ref 0.0–0.5)
Eosinophils Relative: 1 %
HCT: 40.1 % (ref 36.0–46.0)
Hemoglobin: 12.5 g/dL (ref 12.0–15.0)
Immature Granulocytes: 1 %
Lymphocytes Relative: 28 %
Lymphs Abs: 5.1 10*3/uL — ABNORMAL HIGH (ref 0.7–4.0)
MCH: 30.1 pg (ref 26.0–34.0)
MCHC: 31.2 g/dL (ref 30.0–36.0)
MCV: 96.6 fL (ref 80.0–100.0)
Monocytes Absolute: 0.8 10*3/uL (ref 0.1–1.0)
Monocytes Relative: 4 %
Neutro Abs: 12.5 10*3/uL — ABNORMAL HIGH (ref 1.7–7.7)
Neutrophils Relative %: 66 %
Platelets: 296 10*3/uL (ref 150–400)
RBC: 4.15 MIL/uL (ref 3.87–5.11)
RDW: 15.3 % (ref 11.5–15.5)
WBC: 18.7 10*3/uL — ABNORMAL HIGH (ref 4.0–10.5)
nRBC: 0 % (ref 0.0–0.2)

## 2020-09-24 LAB — GLUCOSE, CAPILLARY
Glucose-Capillary: 102 mg/dL — ABNORMAL HIGH (ref 70–99)
Glucose-Capillary: 135 mg/dL — ABNORMAL HIGH (ref 70–99)
Glucose-Capillary: 145 mg/dL — ABNORMAL HIGH (ref 70–99)
Glucose-Capillary: 182 mg/dL — ABNORMAL HIGH (ref 70–99)
Glucose-Capillary: 90 mg/dL (ref 70–99)
Glucose-Capillary: 99 mg/dL (ref 70–99)

## 2020-09-24 LAB — BASIC METABOLIC PANEL
Anion gap: 9 (ref 5–15)
BUN: 36 mg/dL — ABNORMAL HIGH (ref 8–23)
CO2: 21 mmol/L — ABNORMAL LOW (ref 22–32)
Calcium: 8.6 mg/dL — ABNORMAL LOW (ref 8.9–10.3)
Chloride: 108 mmol/L (ref 98–111)
Creatinine, Ser: 0.43 mg/dL — ABNORMAL LOW (ref 0.44–1.00)
GFR, Estimated: >60 mL/min (ref >60)
Glucose, Bld: 102 mg/dL — ABNORMAL HIGH (ref 70–99)
Potassium: 3.6 mmol/L (ref 3.5–5.1)
Sodium: 138 mmol/L (ref 135–145)

## 2020-09-24 SURGERY — RIGHT/LEFT HEART CATH AND CORONARY ANGIOGRAPHY
Anesthesia: Moderate Sedation

## 2020-09-24 MED ORDER — LIDOCAINE HCL (PF) 1 % IJ SOLN
INTRAMUSCULAR | Status: DC | PRN
  Administered 2020-09-24: 4 mL

## 2020-09-24 MED ORDER — SODIUM CHLORIDE 0.9 % IV SOLN: 250.0000 mL | INTRAVENOUS | Status: DC | PRN

## 2020-09-24 MED ORDER — MIDAZOLAM HCL 2 MG/2ML IJ SOLN
INTRAMUSCULAR | Status: AC
  Filled 2020-09-24: qty 2

## 2020-09-24 MED ORDER — SODIUM CHLORIDE 0.9% FLUSH: 3.0000 mL | INTRAVENOUS | Status: DC | PRN

## 2020-09-24 MED ORDER — SODIUM CHLORIDE 0.9 % IV SOLN
INTRAVENOUS | Status: DC
Start: 2020-09-25 — End: 2020-09-24

## 2020-09-24 MED ORDER — SODIUM CHLORIDE 0.9 % IV SOLN: INTRAVENOUS | Status: DC

## 2020-09-24 MED ORDER — HEPARIN (PORCINE) IN NACL 1000-0.9 UT/500ML-% IV SOLN
INTRAVENOUS | Status: DC | PRN
  Administered 2020-09-24: 1000 mL

## 2020-09-24 MED ORDER — LIDOCAINE HCL (PF) 1 % IJ SOLN
INTRAMUSCULAR | Status: AC
  Filled 2020-09-24: qty 30

## 2020-09-24 MED ORDER — IOHEXOL 300 MG/ML  SOLN
INTRAMUSCULAR | Status: DC | PRN
  Administered 2020-09-24: 44 mL

## 2020-09-24 MED ORDER — MIDAZOLAM HCL 2 MG/2ML IJ SOLN
INTRAMUSCULAR | Status: DC | PRN
Start: 2020-09-24 — End: 2020-09-24
  Administered 2020-09-24: 1 mg via INTRAVENOUS

## 2020-09-24 MED ORDER — HEPARIN (PORCINE) IN NACL 1000-0.9 UT/500ML-% IV SOLN
INTRAVENOUS | Status: AC
  Filled 2020-09-24: qty 1000

## 2020-09-24 MED ORDER — VERAPAMIL HCL 2.5 MG/ML IV SOLN
INTRAVENOUS | Status: AC
  Filled 2020-09-24: qty 2

## 2020-09-24 MED ORDER — ASPIRIN 81 MG PO CHEW
CHEWABLE_TABLET | ORAL | Status: AC
  Filled 2020-09-24: qty 1

## 2020-09-24 MED ORDER — ASPIRIN 81 MG PO CHEW: 81.0000 mg | CHEWABLE_TABLET | ORAL | Status: DC

## 2020-09-24 MED ORDER — SODIUM CHLORIDE 0.9% FLUSH
3.0000 mL | Freq: Two times a day (BID) | INTRAVENOUS | Status: DC
  Administered 2020-09-24 – 2020-10-13 (×37): 3 mL via INTRAVENOUS

## 2020-09-24 MED ORDER — HEPARIN SODIUM (PORCINE) 1000 UNIT/ML IJ SOLN
INTRAMUSCULAR | Status: AC
  Filled 2020-09-24: qty 1

## 2020-09-24 MED ORDER — HEPARIN SODIUM (PORCINE) 1000 UNIT/ML IJ SOLN
INTRAMUSCULAR | Status: DC | PRN
  Administered 2020-09-24: 2500 [IU] via INTRAVENOUS

## 2020-09-24 MED ORDER — FENTANYL CITRATE (PF) 100 MCG/2ML IJ SOLN
INTRAMUSCULAR | Status: AC
  Filled 2020-09-24: qty 2

## 2020-09-24 MED ORDER — VERAPAMIL HCL 2.5 MG/ML IV SOLN
INTRAVENOUS | Status: DC | PRN
  Administered 2020-09-24: 2.5 mg via INTRA_ARTERIAL

## 2020-09-24 MED ORDER — FENTANYL CITRATE (PF) 100 MCG/2ML IJ SOLN
INTRAMUSCULAR | Status: DC | PRN
  Administered 2020-09-24: 25 ug via INTRAVENOUS

## 2020-09-24 SURGICAL SUPPLY — 15 items
CATH 5F 110X4 TIG (CATHETERS) ×1 IMPLANT
CATH SWAN GANZ 7F STRAIGHT (CATHETERS) ×1 IMPLANT
COVER PROBE U/S 5X48 (MISCELLANEOUS) ×1 IMPLANT
DEVICE RAD TR BAND REGULAR (VASCULAR PRODUCTS) ×1 IMPLANT
DRAPE BRACHIAL (DRAPES) ×2 IMPLANT
GLIDESHEATH SLEND SS 6F .021 (SHEATH) ×1 IMPLANT
GLIDESHEATH SLENDER 7FR .021G (SHEATH) ×1 IMPLANT
GUIDEWIRE INQWIRE 1.5J.035X260 (WIRE) IMPLANT
INQWIRE 1.5J .035X260CM (WIRE) ×2
KIT SYRINGE INJ CVI SPIKEX1 (MISCELLANEOUS) ×1 IMPLANT
PACK CARDIAC CATH (CUSTOM PROCEDURE TRAY) ×2 IMPLANT
PROTECTION STATION PRESSURIZED (MISCELLANEOUS) ×2
SET ATX SIMPLICITY (MISCELLANEOUS) ×1 IMPLANT
STATION PROTECTION PRESSURIZED (MISCELLANEOUS) IMPLANT
WIRE HITORQ VERSACORE ST 145CM (WIRE) ×1 IMPLANT

## 2020-09-24 NOTE — TOC Progression Note (Signed)
Transition of Care [facility] - Progression Note    Patient Details  Name: Kathryn Garrett MRN: [id number] Date of Birth: [birth year]  Transition of Care [facility]) CM/SW [place], [place] Phone Number: [phone] 09/24/2020, 8:13 AM  Clinical Narrative:     Patient will undergo cardiac cath procedure with possible transfer to CIR when medical ready.  CIR transfer is pending on [employer] authorization.     Barriers to Discharge: Continued Medical Work up  Expected Discharge Plan and Services   In-house Referral: Clinical Social Work   Post Acute Care Choice:  [facility]) Living arrangements for the past 2 months: Single Family Home                                       Social Determinants of Health (SDOH) Interventions    Readmission Risk Interventions No flowsheet data found.

## 2020-09-24 NOTE — Progress Notes (Signed)
Progress Note  Patient Name: Kathryn Garrett Date of Encounter: 09/24/2020  [facility] Cardiologist: Larae Grooms, MD   Subjective   No chest pain or shortness of breath.  Patient is anxious to have catheterization done today.  Inpatient Medications    Scheduled Meds: . aspirin  81 mg Oral QHS  . budesonide (PULMICORT) nebulizer solution  0.25 mg Nebulization BID  . buprenorphine-naloxone  2 tablet Sublingual Daily  . Chlorhexidine Gluconate Cloth  6 each Topical QHS  . clopidogrel  75 mg Oral QHS  . enoxaparin (LOVENOX) injection  40 mg Subcutaneous Q24H  . feeding supplement (NEPRO CARB STEADY)  237 mL Oral TID BM  . fluticasone  1 spray Each Nare Daily  . insulin aspart  0-15 Units Subcutaneous Q4H  . ipratropium-albuterol  3 mL Nebulization TID  . levothyroxine  88 mcg Oral Q0600  . loratadine  10 mg Oral Daily  . mouth rinse  15 mL Mouth Rinse BID  . metoprolol succinate  25 mg Oral Daily  . multivitamin with minerals  1 tablet Oral Daily  . pantoprazole  40 mg Oral Daily  . predniSONE  10 mg Oral Q breakfast  . rosuvastatin  20 mg Oral QHS  . sacubitril-valsartan  1 tablet Oral BID  . sodium chloride flush  3 mL Intravenous Q12H  . sodium chloride flush  3 mL Intravenous Q12H   Continuous Infusions: . sodium chloride 10 mL/hr at 09/17/20 0700   PRN Meds: acetaminophen **OR** acetaminophen, albuterol, artificial tears, docusate, guaiFENesin, metoprolol tartrate, morphine injection, polyethylene glycol   Vital Signs    Vitals:   09/24/20 0413 09/24/20 0500 09/24/20 0600 09/24/20 0700  BP:      Pulse:  78 76 72  Resp:  14 16 15   Temp:      TempSrc:      SpO2:  100% 99% 99%  Weight: 56 kg     Height:        Intake/Output Summary (Last 24 hours) at 09/24/2020 0826 Last data filed at 09/24/2020 0400 Gross per 24 hour  Intake 861 ml  Output 1100 ml  Net -239 ml   Last 3 Weights 09/24/2020 09/23/2020 09/22/2020  Weight (lbs) 123 lb 7.3 oz 123 lb  7.3 oz 130 lb 11.7 oz  Weight (kg) 56 kg 56 kg 59.3 kg      Telemetry    Normal sinus rhythm and sinus tachycardia - Personally Reviewed  ECG    No new tracing  Physical Exam   GEN: No acute distress.   Neck: No JVD Cardiac: RRR, no murmurs, rubs, or gallops.  Respiratory:  Coarse breath sounds without wheezes or crackles. GI: Soft, nontender, non-distended  MS: No edema; No deformity. Neuro:  Nonfocal  Psych: Normal affect   Labs    High Sensitivity Troponin:   Recent Labs  Lab 09/10/20 2050 09/11/20 0437 09/11/20 1433 09/11/20 1611 09/11/20 2011  TROPONINIHS 1,838* 2,434* 2,265* 2,455* 2,317*      Chemistry Recent Labs  Lab 09/22/20 0418 09/23/20 0426 09/24/20 0430  NA 146* 140 138  K 4.4 3.9 3.6  CL 112* 107 108  CO2 25 23 21*  GLUCOSE 98 98 102*  BUN 57* 44* 36*  CREATININE 0.56 0.51 0.43*  CALCIUM 9.5 8.7* 8.6*  GFRNONAA >60 >60 >60  ANIONGAP 9 10 9      Hematology Recent Labs  Lab 09/22/20 0418 09/23/20 0426 09/24/20 0430  WBC 19.3* 19.5* 18.7*  RBC 4.38 4.06 4.15  HGB 13.5 12.3 12.5  HCT 44.3 40.1 40.1  MCV 101.1* 98.8 96.6  MCH 30.8 30.3 30.1  MCHC 30.5 30.7 31.2  RDW 16.4* 15.6* 15.3  PLT 279 263 296    BNPNo results for input(s): BNP, PROBNP in the last 168 hours.   DDimer No results for input(s): DDIMER in the last 168 hours.   Radiology    No results found.  Cardiac Studies   TTE (09/11/2020): 1. Recommend limited echo with iv contrast agent (definity) for addequate  assessment of LVEF and wall motion.. Left ventricular ejection fraction,  by estimation, is 25 to 30%. The left ventricle has severely decreased  function. Left ventricular  endocardial border not optimally defined to evaluate regional wall motion.  Left ventricular diastolic parameters are indeterminate.  2. Right ventricular systolic function is normal. The right ventricular  size is normal.  3. The mitral valve was not well visualized. Mild mitral  valve  regurgitation.  4. The aortic valve was not well visualized. Aortic valve regurgitation  is not visualized.   Patient Profile     75 y.o. female with history of CAD with VF arrest s/p PCI to the RCA in 2017 s/p PTCA to the RCA for ISR in 2018, chronic combined systolic and diastolic CHF, COPD with ongoing tobacco use, HTN, HLD, anemia, and narcotic abuse on Suboxone therapy admitted with acute hypoxic respiratory failure requiring mechanical ventilation secondary to COPD exacerbation Klebsiella PNA and acute on chronic combined CHF with transient ST elevation with subsequent rise on HS-Tn to 2455 and echo demonstrating new LV dysfunction requiring augmentation with milrinone for diuresis.  Assessment & Plan    Acute on chronic HFrEF: Ms. Acheampong denies dyspnea and appears euvolemic on examination today.  Plans for right and left heart catheterization today to better assess volume status and exclude worsening ischemic heart disease.  Proceed with right and left heart catheterization today.  Maintain net even fluid balance.  Patient is not on standing diuretic therapy at this time.  Continue current doses of metoprolol succinate and Entresto pending catheterization.  If volume status and cardiac output are reasonable, escalation of metoprolol could be considered later today or tomorrow.  Coronary artery disease and NSTEMI: Patient with elevated troponin, peaking at 2500, in the setting of acute illness and known underlying CAD.  LVEF significantly reduced from baseline during this hospitalization.  Proceed with right and left heart catheterization today.  Continue dual antiplatelet therapy with aspirin and clopidogrel.  Secondary prevention with rosuvastatin.  Acute respiratory failure with hypoxia: This has resolved and was likely secondary to pneumonia, COPD exacerbation, and element of acute on chronic HFrEF.  Maintain net even fluid balance.  Ongoing management of lung  disease per internal medicine.  PSVT: No recurrence over the last 24 hours.  Continue metoprolol succinate.  Shared Decision Making/Informed Consent The risks [stroke (1 in 1000), death (1 in 1000), kidney failure [usually temporary] (1 in 500), bleeding (1 in 200), allergic reaction [possibly serious] (1 in 200)], benefits (diagnostic support and management of coronary artery disease) and alternatives of a cardiac catheterization were discussed in detail with Ms. Grandville Silos and she is willing to proceed.  For questions or updates, please contact [place] Please consult [email or web address] for contact info under [facility].     Signed, Nelva Bush, MD  09/24/2020, 8:26 AM

## 2020-09-24 NOTE — Consult Note (Signed)
   Heart Failure Nurse Navigator Note  HFrEF 25 to 30%.  Normal right ventricular systolic function mild mitral regurgitation.  She presented on April 29 with complaints of worsening shortness of breath and a new lower extremity edema.  Comorbidities:  Coronary artery disease with previous stenting COPD Hypertension Hyperlipidemia   Medications:  Aspirin 81 mg daily Plavix 75 mg daily Metoprolol succinate 25 mg daily Crestor 20 mg daily Entresto 24/26 mg twice daily   Initial meeting with patient.  Daughters were at the bedside.  Discussed what is heart failure.  Talked about the importance of low-sodium diet, listed things to avoid.  Also discussed the importance of daily weight and what to report.  Went over the zone magnet.  She was a previous smoker but states that she has not been smoking anymore.  Also discussed the heart failure clinic, was given information about the clinic and an appointment time.  She was also given the living with heart failure teaching booklet along with a low sodium handout.   Pricilla Riffle RN [facility]

## 2020-09-24 NOTE — Progress Notes (Signed)
PT Cancellation Note  Patient Details Name: Kathryn Garrett MRN: [id number] DOB: 09-19-1945   Cancelled Treatment:     PT attempt. Pt having heart cath procedure. Will hold PT today and return tomorrow to continue current POC.   Willette Pa 09/24/2020, 10:22 AM

## 2020-09-24 NOTE — Interval H&P Note (Signed)
History and Physical Interval Note:  09/24/2020 9:30 AM  Kathryn Garrett  has presented today for surgery, with the diagnosis of NSTEMI and acute on chronic HFrEF.  The various methods of treatment have been discussed with the patient and family. After consideration of risks, benefits and other options for treatment, the patient has consented to  Procedure(s): RIGHT/LEFT HEART CATH AND CORONARY ANGIOGRAPHY (N/A) as a surgical intervention.  The patient's history has been reviewed, patient examined, no change in status, stable for surgery.  I have reviewed the patient's chart and labs.  Questions were answered to the patient's satisfaction.    Cath Lab Visit (complete for each Cath Lab visit)  Clinical Evaluation Leading to the Procedure:   ACS: Yes.    Non-ACS:  N/A  Kathryn Garrett

## 2020-09-24 NOTE — Progress Notes (Signed)
OT Cancellation Note  Patient Details Name: Kathryn Garrett MRN: [id number] DOB: September 04, 1945   Cancelled Treatment:    Reason Eval/Treat Not Completed: Patient at procedure or test/ unavailable  Pt is OTF at this time for right/left heart cath and coronary angiography. Will f/u for OT treatment at later date/time as able/pt available. Thank you.  Gerrianne Scale, [place], OTR/L ascom [phone] 09/24/20, 10:28 AM

## 2020-09-24 NOTE — Brief Op Note (Signed)
BRIEF CARDIAC CATHETERIZATION NOTE  09/24/2020  11:14 AM  PATIENT:  Kathryn Garrett  75 y.o. female  PRE-OPERATIVE DIAGNOSIS:  NSTEMI and acute on chronic HFrEF  POST-OPERATIVE DIAGNOSIS:  Same  PROCEDURE:  Procedure(s): RIGHT/LEFT HEART CATH AND CORONARY ANGIOGRAPHY (N/A)  SURGEON:  Surgeon(s) and Role:    Nelva Bush, MD - Primary  FINDINGS: 1. Nonobstructive CAD. 2. Normal left and right heart filling pressures. 3. Normal cardiac output/index.  RECOMMENDATIONS: 1. Escalate GDMT at tolerated. 2. Secondary prevention of coronary artery disease.  Nelva Bush, MD [facility]

## 2020-09-24 NOTE — Progress Notes (Signed)
Inpatient Rehabilitation Admissions Coordinator  I will begin insurance Auth with [place] for a possible CIR admit pending insurance approval next week. I spoke with her daughter, Amy, by phone.  Danne Baxter, RN, MSN Rehab Admissions Coordinator [phone] 09/24/2020 1:33 PM

## 2020-09-24 NOTE — Progress Notes (Addendum)
PROGRESS NOTE    Kathryn Garrett  YPP:[id number] DOB: [birth year] DOA: 09/10/2020 PCP: Imagene Riches, NP  Brief Narrative:  74/F presented to the [facility] ED from home with complaints of shortness of breath -Admitted on 4/29 with hypoxic respiratory failure secondary to COPD exacerbation and community-acquired pneumonia -Hospital course was complicated by flash pulmonary edema and NSTEMI requiring mechanical ventilation, intubated on 4/30 -Treated with antibiotics, steroids and diuretics -Eventually extubated on 5/7 [facility] course further complicated by encephalopathy -Cardiology following, plan for left heart cath today   Assessment & Plan:    Acute hypoxic respiratory failure  Acute COPD Exacerbation/left lung multifocal pneumonia Flash pulmonary edema on 4/30 -Intubated for flash pulmonary edema on, extubated 5/7 -Clinically improving, completed antibiotic course -Treated with diuretics as well -Improved and stable, weaned off O2 -Transfer out of ICU today  Sepsis, left lower lobe pneumonia -tracheal aspirate on 4/30 with KLEBSIELLA PNEUMONIAE (resistant to Ampicillin) -Completed 7 days of antibiotics. -Improving, leukocytosis trending down  Acute systolic congestive heart failure/Flash pulmonary edema NSTEMI Paroxysmal SVT -Echo noted with drop in EF down to 30%  -Previously on inotropes in ICU, weaned off, also treated with IV heparin briefly  -Cardiology following, continue metoprolol,  Entresto, appears euvolemic now, diuretics on hold -Also on aspirin Plavix -Cardiology following, for heart cath soon this morning  Acute metabolic/toxic encephalopathy -ICU stay complicated by severe encephalopathy while on vent -At baseline on buprenorphine/naloxone which was held on admission, now resumed -Also had an MRI brain which was unremarkable -Improved and stable now, PT OT as tolerated -CIR recommended for rehab  COPD Ongoing tobacco abuse -Counseled, taper off  prednisone, continue duo nebs  DVT prophylaxis: Lovenox Code Status: Full Family Communication: Daughters at bedside Disposition Plan: Status is: Inpatient  Remains inpatient appropriate because:Inpatient level of care appropriate due to severity of illness   Dispo: The patient is from: Home              Anticipated d/c is to:  CIR hopefully on Monday              Patient currently is not medically stable to d/c.   Difficult to place patient No Consultants:   Cardiology   Procedures: Endotracheal intubation  Antimicrobials:  None   Subjective: -Feels okay overall, denies any chest pain or shortness of breath today, anxious to get cath done sure she can its not like she does not do elective procedures  Objective: Vitals:   09/24/20 1115 09/24/20 1130 09/24/20 1200 09/24/20 1240  BP: (!) 132/52 (!) 126/43 (!) 101/50 (!) 149/58  Pulse:  82 96 85  Resp: 16 17 17 18   Temp:    98.1 F (36.7 C)  TempSrc:    Oral  SpO2: 96% 94% 94% 100%  Weight:    61.7 kg  Height:    4\' 11"  (1.499 m)    Intake/Output Summary (Last 24 hours) at 09/24/2020 1351 Last data filed at 09/24/2020 0800 Gross per 24 hour  Intake 624 ml  Output 1000 ml  Net -376 ml   Filed Weights   09/23/20 0500 09/24/20 0413 09/24/20 1240  Weight: 56 kg 56 kg 61.7 kg    Examination:  General exam: Chronically ill pleasant female sitting up in bed, AAOx3, no distress CVS: S1-S2, regular rate rhythm Lungs: Few basilar rhonchi, otherwise clear Abdomen: Soft, nontender, bowel sounds present Extremities: No edema Skin: No rashes on exposed skin  Psychiatry: Appropriate mood and affect   Data Reviewed: I have  personally reviewed following labs and imaging studies  CBC: Recent Labs  Lab 09/20/20 0356 09/21/20 0206 09/22/20 0418 09/23/20 0426 09/24/20 0430  WBC 20.1* 31.7* 19.3* 19.5* 18.7*  NEUTROABS 12.2* 24.6* 12.3* 13.1* 12.5*  HGB 14.6 14.6 13.5 12.3 12.5  HCT 48.3* 48.7* 44.3 40.1 40.1  MCV  99.6 100.2* 101.1* 98.8 96.6  PLT 335 331 279 263 242   Basic Metabolic Panel: Recent Labs  Lab 09/18/20 0550 09/19/20 0418 09/20/20 0356 09/21/20 0206 09/22/20 0418 09/23/20 0426 09/24/20 0430  NA 139 143 145 147* 146* 140 138  K 3.3* 5.3* 3.0* 3.7 4.4 3.9 3.6  CL 101 106 107 112* 112* 107 108  CO2 27 22 25 25 25 23  21*  GLUCOSE 92 89 124* 127* 98 98 102*  BUN 52* 67* 68* 73* 57* 44* 36*  CREATININE 0.51 0.66 0.66 0.65 0.56 0.51 0.43*  CALCIUM 9.0 9.3 9.1 9.4 9.5 8.7* 8.6*  MG 2.3 2.5* 2.7* 2.5* 2.5*  --   --   PHOS 3.5 4.4 4.2 3.6 3.5  --   --    GFR: Estimated Creatinine Clearance: 48.5 mL/min (A) (by C-G formula based on SCr of 0.43 mg/dL (L)). Liver Function Tests: No results for input(s): AST, ALT, ALKPHOS, BILITOT, PROT, ALBUMIN in the last 168 hours. No results for input(s): LIPASE, AMYLASE in the last 168 hours. No results for input(s): AMMONIA in the last 168 hours. Coagulation Profile: No results for input(s): INR, PROTIME in the last 168 hours. Cardiac Enzymes: No results for input(s): CKTOTAL, CKMB, CKMBINDEX, TROPONINI in the last 168 hours. BNP (last 3 results) No results for input(s): PROBNP in the last 8760 hours. HbA1C: No results for input(s): HGBA1C in the last 72 hours. CBG: Recent Labs  Lab 09/23/20 1547 09/23/20 1954 09/23/20 2320 09/24/20 0318 09/24/20 0747  GLUCAP 128* 120* 107* 102* 90   Lipid Profile: No results for input(s): CHOL, HDL, LDLCALC, TRIG, CHOLHDL, LDLDIRECT in the last 72 hours. Thyroid Function Tests: No results for input(s): TSH, T4TOTAL, FREET4, T3FREE, THYROIDAB in the last 72 hours. Anemia Panel: No results for input(s): VITAMINB12, FOLATE, FERRITIN, TIBC, IRON, RETICCTPCT in the last 72 hours. Sepsis Labs: No results for input(s): PROCALCITON, LATICACIDVEN in the last 168 hours.  Recent Results (from the past 240 hour(s))  Culture, Respiratory w Gram Stain     Status: None   Collection Time: 09/16/20  8:35 AM    Specimen: Tracheal Aspirate; Respiratory  Result Value Ref Range Status   Specimen Description   Final    TRACHEAL ASPIRATE Performed at [facility], [address], [place], [place] [address]    Special Requests   Final    NONE Performed at [facility], [address] [place], [place] [address]    Gram Stain   Final    ABUNDANT WBC PRESENT, PREDOMINANTLY PMN FEW YEAST Performed at [facility], [place] [address], [place], [place] [address]    Culture FEW CANDIDA ALBICANS  Final   Report Status 09/19/2020 FINAL  Final     Scheduled Meds: . aspirin  81 mg Oral QHS  . budesonide (PULMICORT) nebulizer solution  0.25 mg Nebulization BID  . buprenorphine-naloxone  2 tablet Sublingual Daily  . Chlorhexidine Gluconate Cloth  6 each Topical QHS  . clopidogrel  75 mg Oral QHS  . enoxaparin (LOVENOX) injection  40 mg Subcutaneous Q24H  . feeding supplement (NEPRO CARB STEADY)  237 mL Oral TID BM  . fluticasone  1 spray Each Nare Daily  . insulin aspart  0-15 Units Subcutaneous Q4H  . levothyroxine  88 mcg Oral Q0600  . loratadine  10 mg Oral Daily  . mouth rinse  15 mL Mouth Rinse BID  . metoprolol succinate  25 mg Oral Daily  . multivitamin with minerals  1 tablet Oral Daily  . pantoprazole  40 mg Oral Daily  . rosuvastatin  20 mg Oral QHS  . sacubitril-valsartan  1 tablet Oral BID  . sodium chloride flush  3 mL Intravenous Q12H  . sodium chloride flush  3 mL Intravenous Q12H   Continuous Infusions: . sodium chloride Stopped (09/24/20 1321)     LOS: 14 days    Time spent: 25 minutes  Domenic Polite, MD [facility]  09/24/2020, 1:51 PM

## 2020-09-24 NOTE — OR Nursing (Signed)
Removed left AC iv for BP placement, Dr End to place right [facility] for right heart cath in lab

## 2020-09-24 NOTE — Progress Notes (Signed)
SLP Cancellation Note  Patient Details Name: Kathryn Garrett MRN: [id number] DOB: 1945/09/12   Cancelled treatment:       Reason Eval/Treat Not Completed: Patient at procedure or test/unavailable (chart reviewed; checked w/ NSG). Pt is NPO for Cardiology procedure today. ST services will f/u tomorrow w/ pt's toleration of diet and trials to upgrade as appropriate. Recommend frequent oral care for hygiene and stimulation of swallowing.      [name], MS, CCC-SLP Speech Language Pathologist Rehab Services [phone] [facility] 09/24/2020, 1:48 PM

## 2020-09-24 NOTE — Progress Notes (Signed)
Report called to [place] on PCU, Caryl Asp will be taking over as patient's assigned nurse, pt currently in the Cath Lab, she will be taken straight to room 232, her belongings chart and meds have been transferred to that room

## 2020-09-24 NOTE — H&P (View-Only) (Signed)
Progress Note  Patient Name: Kathryn Garrett Date of Encounter: 09/24/2020  [facility] Cardiologist: Larae Grooms, MD   Subjective   No chest pain or shortness of breath.  Patient is anxious to have catheterization done today.  Inpatient Medications    Scheduled Meds: . aspirin  81 mg Oral QHS  . budesonide (PULMICORT) nebulizer solution  0.25 mg Nebulization BID  . buprenorphine-naloxone  2 tablet Sublingual Daily  . Chlorhexidine Gluconate Cloth  6 each Topical QHS  . clopidogrel  75 mg Oral QHS  . enoxaparin (LOVENOX) injection  40 mg Subcutaneous Q24H  . feeding supplement (NEPRO CARB STEADY)  237 mL Oral TID BM  . fluticasone  1 spray Each Nare Daily  . insulin aspart  0-15 Units Subcutaneous Q4H  . ipratropium-albuterol  3 mL Nebulization TID  . levothyroxine  88 mcg Oral Q0600  . loratadine  10 mg Oral Daily  . mouth rinse  15 mL Mouth Rinse BID  . metoprolol succinate  25 mg Oral Daily  . multivitamin with minerals  1 tablet Oral Daily  . pantoprazole  40 mg Oral Daily  . predniSONE  10 mg Oral Q breakfast  . rosuvastatin  20 mg Oral QHS  . sacubitril-valsartan  1 tablet Oral BID  . sodium chloride flush  3 mL Intravenous Q12H  . sodium chloride flush  3 mL Intravenous Q12H   Continuous Infusions: . sodium chloride 10 mL/hr at 09/17/20 0700   PRN Meds: acetaminophen **OR** acetaminophen, albuterol, artificial tears, docusate, guaiFENesin, metoprolol tartrate, morphine injection, polyethylene glycol   Vital Signs    Vitals:   09/24/20 0413 09/24/20 0500 09/24/20 0600 09/24/20 0700  BP:      Pulse:  78 76 72  Resp:  14 16 15   Temp:      TempSrc:      SpO2:  100% 99% 99%  Weight: 56 kg     Height:        Intake/Output Summary (Last 24 hours) at 09/24/2020 0826 Last data filed at 09/24/2020 0400 Gross per 24 hour  Intake 861 ml  Output 1100 ml  Net -239 ml   Last 3 Weights 09/24/2020 09/23/2020 09/22/2020  Weight (lbs) 123 lb 7.3 oz 123 lb  7.3 oz 130 lb 11.7 oz  Weight (kg) 56 kg 56 kg 59.3 kg      Telemetry    Normal sinus rhythm and sinus tachycardia - Personally Reviewed  ECG    No new tracing  Physical Exam   GEN: No acute distress.   Neck: No JVD Cardiac: RRR, no murmurs, rubs, or gallops.  Respiratory:  Coarse breath sounds without wheezes or crackles. GI: Soft, nontender, non-distended  MS: No edema; No deformity. Neuro:  Nonfocal  Psych: Normal affect   Labs    High Sensitivity Troponin:   Recent Labs  Lab 09/10/20 2050 09/11/20 0437 09/11/20 1433 09/11/20 1611 09/11/20 2011  TROPONINIHS 1,838* 2,434* 2,265* 2,455* 2,317*      Chemistry Recent Labs  Lab 09/22/20 0418 09/23/20 0426 09/24/20 0430  NA 146* 140 138  K 4.4 3.9 3.6  CL 112* 107 108  CO2 25 23 21*  GLUCOSE 98 98 102*  BUN 57* 44* 36*  CREATININE 0.56 0.51 0.43*  CALCIUM 9.5 8.7* 8.6*  GFRNONAA >60 >60 >60  ANIONGAP 9 10 9      Hematology Recent Labs  Lab 09/22/20 0418 09/23/20 0426 09/24/20 0430  WBC 19.3* 19.5* 18.7*  RBC 4.38 4.06 4.15  HGB 13.5 12.3 12.5  HCT 44.3 40.1 40.1  MCV 101.1* 98.8 96.6  MCH 30.8 30.3 30.1  MCHC 30.5 30.7 31.2  RDW 16.4* 15.6* 15.3  PLT 279 263 296    BNPNo results for input(s): BNP, PROBNP in the last 168 hours.   DDimer No results for input(s): DDIMER in the last 168 hours.   Radiology    No results found.  Cardiac Studies   TTE (09/11/2020): 1. Recommend limited echo with iv contrast agent (definity) for addequate  assessment of LVEF and wall motion.. Left ventricular ejection fraction,  by estimation, is 25 to 30%. The left ventricle has severely decreased  function. Left ventricular  endocardial border not optimally defined to evaluate regional wall motion.  Left ventricular diastolic parameters are indeterminate.  2. Right ventricular systolic function is normal. The right ventricular  size is normal.  3. The mitral valve was not well visualized. Mild mitral  valve  regurgitation.  4. The aortic valve was not well visualized. Aortic valve regurgitation  is not visualized.   Patient Profile     75 y.o. female with history of CAD with VF arrest s/p PCI to the RCA in 2017 s/p PTCA to the RCA for ISR in 2018, chronic combined systolic and diastolic CHF, COPD with ongoing tobacco use, HTN, HLD, anemia, and narcotic abuse on Suboxone therapy admitted with acute hypoxic respiratory failure requiring mechanical ventilation secondary to COPD exacerbation Klebsiella PNA and acute on chronic combined CHF with transient ST elevation with subsequent rise on HS-Tn to 2455 and echo demonstrating new LV dysfunction requiring augmentation with milrinone for diuresis.  Assessment & Plan    Acute on chronic HFrEF: Ms. Acheampong denies dyspnea and appears euvolemic on examination today.  Plans for right and left heart catheterization today to better assess volume status and exclude worsening ischemic heart disease.  Proceed with right and left heart catheterization today.  Maintain net even fluid balance.  Patient is not on standing diuretic therapy at this time.  Continue current doses of metoprolol succinate and Entresto pending catheterization.  If volume status and cardiac output are reasonable, escalation of metoprolol could be considered later today or tomorrow.  Coronary artery disease and NSTEMI: Patient with elevated troponin, peaking at 2500, in the setting of acute illness and known underlying CAD.  LVEF significantly reduced from baseline during this hospitalization.  Proceed with right and left heart catheterization today.  Continue dual antiplatelet therapy with aspirin and clopidogrel.  Secondary prevention with rosuvastatin.  Acute respiratory failure with hypoxia: This has resolved and was likely secondary to pneumonia, COPD exacerbation, and element of acute on chronic HFrEF.  Maintain net even fluid balance.  Ongoing management of lung  disease per internal medicine.  PSVT: No recurrence over the last 24 hours.  Continue metoprolol succinate.  Shared Decision Making/Informed Consent The risks [stroke (1 in 1000), death (1 in 1000), kidney failure [usually temporary] (1 in 500), bleeding (1 in 200), allergic reaction [possibly serious] (1 in 200)], benefits (diagnostic support and management of coronary artery disease) and alternatives of a cardiac catheterization were discussed in detail with Ms. Grandville Silos and she is willing to proceed.  For questions or updates, please contact [place] Please consult [email or web address] for contact info under [facility].     Signed, Nelva Bush, MD  09/24/2020, 8:26 AM

## 2020-09-25 DIAGNOSIS — J441 Chronic obstructive pulmonary disease with (acute) exacerbation: Secondary | ICD-10-CM | POA: Diagnosis not present

## 2020-09-25 LAB — CBC WITH DIFFERENTIAL/PLATELET
Abs Immature Granulocytes: 0.08 10*3/uL — ABNORMAL HIGH (ref 0.00–0.07)
Basophils Absolute: 0 10*3/uL (ref 0.0–0.1)
Basophils Relative: 0 %
Eosinophils Absolute: 0.1 10*3/uL (ref 0.0–0.5)
Eosinophils Relative: 1 %
HCT: 40.4 % (ref 36.0–46.0)
Hemoglobin: 12.9 g/dL (ref 12.0–15.0)
Immature Granulocytes: 1 %
Lymphocytes Relative: 29 %
Lymphs Abs: 5.1 10*3/uL — ABNORMAL HIGH (ref 0.7–4.0)
MCH: 30.6 pg (ref 26.0–34.0)
MCHC: 31.9 g/dL (ref 30.0–36.0)
MCV: 95.7 fL (ref 80.0–100.0)
Monocytes Absolute: 0.9 10*3/uL (ref 0.1–1.0)
Monocytes Relative: 5 %
Neutro Abs: 11.4 10*3/uL — ABNORMAL HIGH (ref 1.7–7.7)
Neutrophils Relative %: 64 %
Platelets: 327 10*3/uL (ref 150–400)
RBC: 4.22 MIL/uL (ref 3.87–5.11)
RDW: 15.3 % (ref 11.5–15.5)
WBC: 17.8 10*3/uL — ABNORMAL HIGH (ref 4.0–10.5)
nRBC: 0 % (ref 0.0–0.2)

## 2020-09-25 LAB — GLUCOSE, CAPILLARY
Glucose-Capillary: 80 mg/dL (ref 70–99)
Glucose-Capillary: 98 mg/dL (ref 70–99)
Glucose-Capillary: 116 mg/dL — ABNORMAL HIGH (ref 70–99)
Glucose-Capillary: 116 mg/dL — ABNORMAL HIGH (ref 70–99)
Glucose-Capillary: 128 mg/dL — ABNORMAL HIGH (ref 70–99)
Glucose-Capillary: 109 mg/dL — ABNORMAL HIGH (ref 70–99)

## 2020-09-25 LAB — BASIC METABOLIC PANEL
Anion gap: 8 (ref 5–15)
BUN: 31 mg/dL — ABNORMAL HIGH (ref 8–23)
CO2: 23 mmol/L (ref 22–32)
Calcium: 9 mg/dL (ref 8.9–10.3)
Chloride: 106 mmol/L (ref 98–111)
Creatinine, Ser: 0.53 mg/dL (ref 0.44–1.00)
GFR, Estimated: >60 mL/min (ref >60)
Glucose, Bld: 87 mg/dL (ref 70–99)
Potassium: 3.7 mmol/L (ref 3.5–5.1)
Sodium: 137 mmol/L (ref 135–145)

## 2020-09-25 MED ORDER — METOPROLOL SUCCINATE ER 50 MG PO TB24
50.0000 mg | ORAL_TABLET | Freq: Every day | ORAL | Status: DC
  Administered 2020-09-26 – 2020-10-02 (×7): 50 mg via ORAL
  Filled 2020-09-25 (×9): qty 1

## 2020-09-25 NOTE — Progress Notes (Signed)
Physical Therapy Treatment Patient Details Name: Kathryn Garrett MRN: [id number] DOB: Aug 16, 1945 Today's Date: 09/25/2020    History of Present Illness 75 y.o. female with history of CAD with VF arrest s/p PCI to the RCA in 2017 s/p PTCA to the RCA for ISR in 2018, chronic combined systolic and diastolic CHF, COPD with ongoing tobacco use, HTN, HLD, anemia, and narcotic abuse on Suboxone therapy admitted with acute hypoxic respiratory failure requiring mechanical ventilation (extubated 09/17/2020) secondary to COPD exacerbation Klebsiella PNA and acute on chronic combined CHF with transient ST elevation and echo demonstrating new LV dysfunction.    PT Comments    Pt seen for PT treatment with family present in room. Pt requesting to return to bed 2/2 fatigue & requires min assist for sit>stand, mod assist for stand pivot with RW. Pt with posterior lean requiring assistance & cuing for anterior weight shifting. Demonstrates decreased awareness of RW management during transfer. Pt declines further standing attempts is able to transfer sit>supine without assistance, then declines bed level exercises. Reviewed use of incentive spirometer with pt. Pt would benefit from intensive therapies at CIR to maximize independence with functional mobility & reduce fall risk prior to return home.   Follow Up Recommendations  CIR     Equipment Recommendations  None recommended by PT    Recommendations for Other Services Rehab consult     Precautions / Restrictions Precautions Precautions: Fall Restrictions Weight Bearing Restrictions: No    Mobility  Bed Mobility   Bed Mobility: Sit to Supine       Sit to supine: Supervision;HOB elevated        Transfers Overall transfer level: Needs assistance Equipment used: Rolling walker (2 wheeled) Transfers: Sit to/from [facility] Sit to Stand: Min assist Stand pivot transfers: Mod assist;Min assist          Ambulation/Gait                  Stairs             Wheelchair Mobility    Modified Rankin (Stroke Patients Only)       Balance Overall balance assessment: Needs assistance Sitting-balance support: Feet supported Sitting balance-Leahy Scale: Good       Standing balance-Leahy Scale: Fair                              Cognition Arousal/Alertness: Awake/alert Behavior During Therapy: WFL for tasks assessed/performed Overall Cognitive Status: Impaired/Different from baseline Area of Impairment: Attention;Memory;Safety/judgement;Awareness;Following commands;Problem solving                       Following Commands: Follows one step commands consistently;Follows multi-step commands with increased time              Exercises      General Comments General comments (skin integrity, edema, etc.): max HR of 146 bpm during session, SpO2 >90% on room air, reviewed use of incentive spirometer with pt      Pertinent Vitals/Pain Pain Assessment: No/denies pain    Home Living                      Prior Function            PT Goals (current goals can now be found in the care plan section) Acute Rehab PT Goals Patient Stated Goal: get better so I can get home PT Goal  Formulation: With patient Time For Goal Achievement: 10/02/20 Potential to Achieve Goals: Fair Progress towards PT goals: Progressing toward goals    Frequency    7X/week      PT Plan Current plan remains appropriate    Co-evaluation              AM-PAC PT "6 Clicks" Mobility   Outcome Measure  Help needed turning from your back to your side while in a flat bed without using bedrails?: A Little Help needed moving from lying on your back to sitting on the side of a flat bed without using bedrails?: A Little Help needed moving to and from a bed to a chair (including a wheelchair)?: A Lot Help needed standing up from a chair using your arms (e.g., wheelchair or bedside  chair)?: A Little Help needed to walk in hospital room?: A Lot Help needed climbing 3-5 steps with a railing? : Total 6 Click Score: 14    End of Session Equipment Utilized During Treatment: Gait belt Activity Tolerance: Patient limited by fatigue Patient left: in bed;with call bell/phone within reach;with bed alarm set;with family/visitor present   PT Visit Diagnosis: Difficulty in walking, not elsewhere classified (R26.2);Muscle weakness (generalized) (M62.81);Unsteadiness on feet (R26.81)     Time: 7341-9379 PT Time Calculation (min) (ACUTE ONLY): 9 min  Charges:  $Therapeutic Activity: 8-22 mins                     Kathryn Garrett, PT, DPT 09/25/20, 3:08 PM    Kathryn Garrett 09/25/2020, 3:07 PM

## 2020-09-25 NOTE — Progress Notes (Signed)
PROGRESS NOTE    Kathryn Garrett  WJX:[id number] DOB: [birth year] DOA: 09/10/2020 PCP: Imagene Riches, NP  Brief Narrative:  74/F presented to the [facility] ED from home with complaints of shortness of breath -Admitted on 4/29 with hypoxic respiratory failure secondary to COPD exacerbation and community-acquired pneumonia -Hospital course was complicated by flash pulmonary edema and NSTEMI requiring mechanical ventilation, intubated on 4/30 -Treated with antibiotics, steroids and diuretics -Eventually extubated on 5/7 [facility] course further complicated by encephalopathy -Cardiology following, underwent left heart cath which noted nonobstructive CAD, medical management recommended -PT eval completed, plan for discharge to [place]:    Acute hypoxic respiratory failure  Acute COPD Exacerbation/left lung multifocal pneumonia Flash pulmonary edema on 4/30 -Intubated for flash pulmonary edema on, extubated 5/7 -Clinically improving, completed antibiotic course -Treated with diuretics as well -Improved and stable, weaned off O2 -Discharge planning, plan for CIR pending insurance authorization  Sepsis, left lower lobe pneumonia -tracheal aspirate on 4/30 with KLEBSIELLA PNEUMONIAE (resistant to Ampicillin) -Completed 7 days of antibiotics. -Improving -SLP following, advance diet as tolerated  Acute systolic congestive heart failure/Flash pulmonary edema NSTEMI Paroxysmal SVT -Echo noted with drop in EF down to 30%  -Previously on inotropes in ICU, weaned off, also treated with IV heparin briefly  -Cardiology following, continue metoprolol,  Entresto, appears euvolemic now, diuretics on hold -Also on aspirin Plavix -Left heart cath with nonobstructive CAD, recommended medical management, plan for repeat echo in 2 to 3 months  Acute metabolic/toxic encephalopathy -ICU stay complicated by severe encephalopathy while on vent -At baseline on buprenorphine/naloxone  which was held on admission, now resumed -Also had an MRI brain which was unremarkable -Improved and stable now, PT OT as tolerated -CIR recommended for rehab  COPD Ongoing tobacco abuse -Counseled, taper off prednisone, continue duo nebs  DVT prophylaxis: Lovenox Code Status: Full Family Communication: No family at bedside, discussed with daughter yesterday Disposition Plan: Status is: Inpatient  Remains inpatient appropriate because:Inpatient level of care appropriate due to severity of illness   Dispo: The patient is from: Home              Anticipated d/c is to:  CIR hopefully on Monday              Patient currently is not medically stable to d/c.   Difficult to place patient No Consultants:   Cardiology   Procedures: Endotracheal intubation Left heart catheterization PROCEDURE:  Procedure(s): RIGHT/LEFT HEART CATH AND CORONARY ANGIOGRAPHY (N/A)  SURGEON:  Surgeon(s) and Role:    * End, Harrell Gave, MD - Primary  FINDINGS: 1. Nonobstructive CAD. 2. Normal left and right heart filling pressures. 3. Normal cardiac output/index.  RECOMMENDATIONS: 1. Escalate GDMT at tolerated. 2. Secondary prevention of coronary artery disease    Antimicrobials:  None   Subjective: -Feels okay overall, breathing improving  Objective: Vitals:   09/25/20 0045 09/25/20 0349 09/25/20 0711 09/25/20 1116  BP: (!) 141/65 (!) 134/52 134/61 129/66  Pulse:  78 84 83  Resp: 20 20 19 19   Temp: 97.8 F (36.6 C) 98.6 F (37 C) 98.3 F (36.8 C) 98.3 F (36.8 C)  TempSrc: Oral Oral Oral Oral  SpO2: 91% 93% 95% 94%  Weight:   57.2 kg   Height:        Intake/Output Summary (Last 24 hours) at 09/25/2020 1414 Last data filed at 09/25/2020 0556 Gross per 24 hour  Intake 355 ml  Output 650 ml  Net -295 ml  Filed Weights   09/24/20 0413 09/24/20 1240 09/25/20 0711  Weight: 56 kg 61.7 kg 57.2 kg    Examination:  General exam: Pleasant chronically ill female sitting up  in bed, AAOx3, no distress CVS: S1-S2, regular rate rhythm Lungs: Rare basilar rhonchi, otherwise clear Abdomen: Soft, nontender, bowel sounds present Extremities: No edema Skin: No rashes on exposed skin  Psychiatry: Appropriate mood and affect   Data Reviewed: I have personally reviewed following labs and imaging studies  CBC: Recent Labs  Lab 09/21/20 0206 09/22/20 0418 09/23/20 0426 09/24/20 0430 09/25/20 0328  WBC 31.7* 19.3* 19.5* 18.7* 17.8*  NEUTROABS 24.6* 12.3* 13.1* 12.5* 11.4*  HGB 14.6 13.5 12.3 12.5 12.9  HCT 48.7* 44.3 40.1 40.1 40.4  MCV 100.2* 101.1* 98.8 96.6 95.7  PLT 331 279 263 296 097   Basic Metabolic Panel: Recent Labs  Lab 09/19/20 0418 09/20/20 0356 09/21/20 0206 09/22/20 0418 09/23/20 0426 09/24/20 0430 09/25/20 0328  NA 143 145 147* 146* 140 138 137  K 5.3* 3.0* 3.7 4.4 3.9 3.6 3.7  CL 106 107 112* 112* 107 108 106  CO2 22 25 25 25 23  21* 23  GLUCOSE 89 124* 127* 98 98 102* 87  BUN 67* 68* 73* 57* 44* 36* 31*  CREATININE 0.66 0.66 0.65 0.56 0.51 0.43* 0.53  CALCIUM 9.3 9.1 9.4 9.5 8.7* 8.6* 9.0  MG 2.5* 2.7* 2.5* 2.5*  --   --   --   PHOS 4.4 4.2 3.6 3.5  --   --   --    GFR: Estimated Creatinine Clearance: 46.8 mL/min (by C-G formula based on SCr of 0.53 mg/dL). Liver Function Tests: No results for input(s): AST, ALT, ALKPHOS, BILITOT, PROT, ALBUMIN in the last 168 hours. No results for input(s): LIPASE, AMYLASE in the last 168 hours. No results for input(s): AMMONIA in the last 168 hours. Coagulation Profile: No results for input(s): INR, PROTIME in the last 168 hours. Cardiac Enzymes: No results for input(s): CKTOTAL, CKMB, CKMBINDEX, TROPONINI in the last 168 hours. BNP (last 3 results) No results for input(s): PROBNP in the last 8760 hours. HbA1C: No results for input(s): HGBA1C in the last 72 hours. CBG: Recent Labs  Lab 09/24/20 1938 09/24/20 2352 09/25/20 0349 09/25/20 0713 09/25/20 1118  GLUCAP 145* 135* 80 98  116*   Lipid Profile: No results for input(s): CHOL, HDL, LDLCALC, TRIG, CHOLHDL, LDLDIRECT in the last 72 hours. Thyroid Function Tests: No results for input(s): TSH, T4TOTAL, FREET4, T3FREE, THYROIDAB in the last 72 hours. Anemia Panel: No results for input(s): VITAMINB12, FOLATE, FERRITIN, TIBC, IRON, RETICCTPCT in the last 72 hours. Sepsis Labs: No results for input(s): PROCALCITON, LATICACIDVEN in the last 168 hours.  Recent Results (from the past 240 hour(s))  Culture, Respiratory w Gram Stain     Status: None   Collection Time: 09/16/20  8:35 AM   Specimen: Tracheal Aspirate; Respiratory  Result Value Ref Range Status   Specimen Description   Final    TRACHEAL ASPIRATE Performed at [facility], [address] [place], [place] [address]    Special Requests   Final    NONE Performed at [facility], [address] [place], [place] [address]    Gram Stain   Final    ABUNDANT WBC PRESENT, PREDOMINANTLY PMN FEW YEAST Performed at [facility], [place] [address], [place], [place] [address]    Culture FEW CANDIDA ALBICANS  Final   Report Status 09/19/2020 FINAL  Final  Scheduled Meds: . aspirin  81 mg Oral QHS  . budesonide (PULMICORT) nebulizer solution  0.25 mg Nebulization BID  . buprenorphine-naloxone  2 tablet Sublingual Daily  . Chlorhexidine Gluconate Cloth  6 each Topical QHS  . clopidogrel  75 mg Oral QHS  . enoxaparin (LOVENOX) injection  40 mg Subcutaneous Q24H  . feeding supplement (NEPRO CARB STEADY)  237 mL Oral TID BM  . fluticasone  1 spray Each Nare Daily  . insulin aspart  0-15 Units Subcutaneous Q4H  . levothyroxine  88 mcg Oral Q0600  . loratadine  10 mg Oral Daily  . mouth rinse  15 mL Mouth Rinse BID  . [START ON 09/26/2020] metoprolol succinate  50 mg Oral Daily  . multivitamin with minerals  1 tablet Oral Daily  . pantoprazole  40 mg Oral Daily  . rosuvastatin  20 mg Oral QHS  . sacubitril-valsartan  1 tablet Oral  BID  . sodium chloride flush  3 mL Intravenous Q12H  . sodium chloride flush  3 mL Intravenous Q12H   Continuous Infusions: . sodium chloride Stopped (09/24/20 1321)    LOS: 15 days   Time spent: 25 minutes  Domenic Polite, MD [facility]  09/25/2020, 2:14 PM

## 2020-09-25 NOTE — Progress Notes (Signed)
Speech Language Pathology Treatment: Dysphagia  Patient Details Name: Kathryn Garrett MRN: [id number] DOB: [birth year] Today's Date: 09/25/2020 Time: 1150-1230 SLP Time Calculation (min) (ACUTE ONLY): 40 min  Assessment / Plan / Recommendation Clinical Impression  Pt seen for ongoing assessment of swallowing.Shewas alert/talkative, reclined in bed. Pt c/o mild+ sore throat from her procedure yesterday: CARDIAC CATHETERIZATION. Vocal quality appeared min raspy, strained but similar to previously post extubation. Pt followedinstructions w/ min cues as reminders. Improved UE strength holding Cup when drinking and feeding self ice chips. Pt has been orally extubated since 09/17/2020. Pt is on 1L [place] O2 support; noted wbc remains somewhat elevated.Noted per NP notes,Echocardiogram this admission showed severely reduced ejection fraction, EF 25 to 30%.Pt is eager to drink coffee -- "not that thickened stuff". Noted min Congested Cough this session as during previous session. Ptexplained general aspiration precautions and agreed verbally to the need for following them especially sitting upright for all oral intake(more supported forward/upright), little to No Talking during oral intake,and taking Small, Single sips(min cues needed during trials). Pt held Cup and fed selftrials ofice chips,thin liquids.No immediate or delayed overt,clinical s/s of aspiration were noted w/ trials of ice chips and thin liquids except a delayedthroat clearing x1/6 trials w/ ice chips; no overt deficits noted w/ thin liquids w/ these trials. Respiratory status remained calm and unlabored, vocal quality raspy but clear b/t trials. Pt followed instructions for single, small sips slowly. Oral phase appeared [facility] for bolus management and timely A-P transfer for swallowing; oral clearing achieved.Pt is wearing her upper Denture for eating of soft solid foods now -- she denied any difficulty w/ the soft, cooked foods of the  dysphagia level 3 diet(MD had place an order for a level 3 post procedure). Discussed Mincing meats for pt for ease of oral phase d/t missing Dentition and fit of upper Denture plate; pt agreed.  Discussed pt's presentation of swallowing of thin liquids and risk for aspiration/Pulmonary decline w/ pt, NSG,and MD; pt wants to be on thin liquids at this time. MD agreed w/ trial run of thin liquids w/ strict aspiration precautions as posted in room w/ NSG to monitor pt's status and downgrade to Nectar again if indicated. Pt is at risk for Pulmonary decline from aspiration d/t recent oral intubation and declined respiratory status currently.  Recommend diet of aDysphagia level3diet (MINCED meatsw/ gravies added to moisten);Thinliquids via Cup only. Recommend aspiration precautions; PillsWhole in Puree; tray setup,positioning assistance, and reduced Distractions and Talking during oral intake. Supervision at meals for any s/s of aspiration w/ po's, drinking of thin liquids. ST services will continue to f/u w/ pt for toleration of diet w/ possible need for MBSS for objective assessment on Monday,and education while admitted. MD/NSG updated, agreed. Precautions posted at bedside.     HPI HPI: Pt is a 75 yo female admitted for COPD exacerbation and left lower lobe pneumonia. Tracheal aspirate from 4/30 with KLEBSIELLA PNEUMONIAE (resistant to Ampicillin). Head CT 09/17/2020 No acute intracranial abnormality.Past medical history including HRrEF, tobacco abuse, CAD s/p stenting, CHF, COPD who is admitted with COPD exacerbation / pneumonia / decompensated heart failure / NSTEMI.  Pt intubated 09/11/2020 thru 09/17/2020. Extubated then w/ NG placed for meds, po's.      SLP Plan  Continue with current plan of care       Recommendations  Diet recommendations: Dysphagia 3 (mechanical soft);Thin liquid (meats/ minced) Liquids provided via: Cup;No straw Medication Administration: Whole meds with puree  (for safer swallowing) Supervision:  Patient able to self feed;Staff to assist with self feeding;Intermittent supervision to cue for compensatory strategies Compensations: Minimize environmental distractions;Slow rate;Small sips/bites;Lingual sweep for clearance of pocketing;Follow solids with liquid Postural Changes and/or Swallow Maneuvers: Out of bed for meals;Seated upright 90 degrees;Upright 30-60 min after meal                General recommendations:  (Dietician f/u) Oral Care Recommendations: Oral care BID;Oral care before and after PO;Staff/trained caregiver to provide oral care Follow up Recommendations: Inpatient Rehab (tbd) SLP Visit Diagnosis: Dysphagia, pharyngeal phase (R13.13) Plan: Continue with current plan of care       GO                 [name], [place], [place] Pathologist Rehab Services [phone] [facility] 09/25/2020, 2:45 PM

## 2020-09-25 NOTE — Progress Notes (Signed)
Progress Note  Patient Name: Kathryn Garrett Date of Encounter: 09/25/2020  [facility] Cardiologist: Kathryn Grooms, MD   Subjective   Feeling well today.  No chest pain or shortness of breath.  No acute complaints at this time.    Inpatient Medications    Scheduled Meds: . aspirin  81 mg Oral QHS  . budesonide (PULMICORT) nebulizer solution  0.25 mg Nebulization BID  . buprenorphine-naloxone  2 tablet Sublingual Daily  . Chlorhexidine Gluconate Cloth  6 each Topical QHS  . clopidogrel  75 mg Oral QHS  . enoxaparin (LOVENOX) injection  40 mg Subcutaneous Q24H  . feeding supplement (NEPRO CARB STEADY)  237 mL Oral TID BM  . fluticasone  1 spray Each Nare Daily  . insulin aspart  0-15 Units Subcutaneous Q4H  . levothyroxine  88 mcg Oral Q0600  . loratadine  10 mg Oral Daily  . mouth rinse  15 mL Mouth Rinse BID  . metoprolol succinate  25 mg Oral Daily  . multivitamin with minerals  1 tablet Oral Daily  . pantoprazole  40 mg Oral Daily  . rosuvastatin  20 mg Oral QHS  . sacubitril-valsartan  1 tablet Oral BID  . sodium chloride flush  3 mL Intravenous Q12H  . sodium chloride flush  3 mL Intravenous Q12H   Continuous Infusions: . sodium chloride Stopped (09/24/20 1321)   PRN Meds: acetaminophen **OR** acetaminophen, albuterol, artificial tears, docusate, guaiFENesin, metoprolol tartrate, morphine injection, polyethylene glycol   Vital Signs    Vitals:   09/25/20 0045 09/25/20 0349 09/25/20 0711 09/25/20 1116  BP: (!) 141/65 (!) 134/52 134/61 129/66  Pulse:  78 84 83  Resp: 20 20 19 19   Temp: 97.8 F (36.6 C) 98.6 F (37 C) 98.3 F (36.8 C) 98.3 F (36.8 C)  TempSrc: Oral Oral Oral Oral  SpO2: 91% 93% 95% 94%  Weight:   57.2 kg   Height:        Intake/Output Summary (Last 24 hours) at 09/25/2020 1313 Last data filed at 09/25/2020 0556 Gross per 24 hour  Intake 355 ml  Output 650 ml  Net -295 ml   Last 3 Weights 09/25/2020 09/24/2020 09/24/2020   Weight (lbs) 126 lb 3.2 oz 136 lb 1.6 oz 123 lb 7.3 oz  Weight (kg) 57.244 kg 61.735 kg 56 kg      Telemetry    Sinus tachycardia-personally reviewed  ECG    None new  Physical Exam   GEN: Well nourished, well developed, in no acute distress  HEENT: normal  Neck: no JVD, carotid bruits, or masses Cardiac: Tachycardic, regular; no murmurs, rubs, or gallops,no edema  Respiratory:  clear to auscultation bilaterally, normal work of breathing GI: soft, nontender, nondistended, + BS MS: no deformity or atrophy  Skin: warm and dry Neuro:  Strength and sensation are intact Psych: euthymic mood, full affect   Labs    High Sensitivity Troponin:   Recent Labs  Lab 09/10/20 2050 09/11/20 0437 09/11/20 1433 09/11/20 1611 09/11/20 2011  TROPONINIHS 1,838* 2,434* 2,265* 2,455* 2,317*      Chemistry Recent Labs  Lab 09/23/20 0426 09/24/20 0430 09/25/20 0328  NA 140 138 137  K 3.9 3.6 3.7  CL 107 108 106  CO2 23 21* 23  GLUCOSE 98 102* 87  BUN 44* 36* 31*  CREATININE 0.51 0.43* 0.53  CALCIUM 8.7* 8.6* 9.0  GFRNONAA >60 >60 >60  ANIONGAP 10 9 8      Hematology Recent Labs  Lab 09/23/20 0426 09/24/20 0430 09/25/20 0328  WBC 19.5* 18.7* 17.8*  RBC 4.06 4.15 4.22  HGB 12.3 12.5 12.9  HCT 40.1 40.1 40.4  MCV 98.8 96.6 95.7  MCH 30.3 30.1 30.6  MCHC 30.7 31.2 31.9  RDW 15.6* 15.3 15.3  PLT 263 296 327    BNPNo results for input(s): BNP, PROBNP in the last 168 hours.   DDimer No results for input(s): DDIMER in the last 168 hours.   Radiology    CARDIAC CATHETERIZATION  Result Date: 09/24/2020 Conclusions: 1. Mild to moderate, non-obstructive coronary artery disease including 20-35% distal LMCA and 50% ostial LAD disease, similar to prior catheterization when FFR was not hemodynamically significant. 2. Patent proximal RCA stents with mild to moderate in-stent restenosis (30-40%). 3. Normal left and right heart filling pressures. 4. Normal pulmonary artery  pressures. 5. Normal cardiac output/index. Recommendations: 1. Maintain net even fluid balance. 2. Optimize goal-directed medical therapy with plans to repeat limited echo in 4-6 weeks. 3. Continue secondary prevention of coronary artery disease. Kathryn Bush, MD [facility]    Cardiac Studies   TTE (09/11/2020): 1. Recommend limited echo with iv contrast agent (definity) for addequate  assessment of LVEF and wall motion.. Left ventricular ejection fraction,  by estimation, is 25 to 30%. The left ventricle has severely decreased  function. Left ventricular  endocardial border not optimally defined to evaluate regional wall motion.  Left ventricular diastolic parameters are indeterminate.  2. Right ventricular systolic function is normal. The right ventricular  size is normal.  3. The mitral valve was not well visualized. Mild mitral valve  regurgitation.  4. The aortic valve was not well visualized. Aortic valve regurgitation  is not visualized.   Patient Profile     75 y.o. female with history of CAD with VF arrest s/p PCI to the RCA in 2017 s/p PTCA to the RCA for ISR in 2018, chronic combined systolic and diastolic CHF, COPD with ongoing tobacco use, HTN, HLD, anemia, and narcotic abuse on Suboxone therapy admitted with acute hypoxic respiratory failure requiring mechanical ventilation secondary to COPD exacerbation Klebsiella PNA and acute on chronic combined CHF with transient ST elevation with subsequent rise on HS-Tn to 2455 and echo demonstrating new LV dysfunction requiring augmentation with milrinone for diuresis.  Assessment & Plan    1.  Acute on chronic systolic heart failure: Patient appears to be euvolemic.  She is not short of breath.  Right and left heart catheterization with normal pressures yesterday.  She did have sinus tachycardia after working with therapy. Kathryn Garrett increase Toprol-XL to 50 mg.  Her creatinine has remained stable.  Continue current Entresto dose.     2.  Non-STEMI: Has a history of coronary artery disease.  Left heart catheterization performed yesterday with no acute lesion.  Continue aspirin and Plavix.    3.  Acute respiratory failure with hypoxia: Likely secondary to pneumonia and COPD exacerbation.  Plan per primary team.  4.  SVT: None noted.  For questions or updates, please contact [place] Please consult [email or web address] for contact info under [facility].     Signed, Rhina Kramme Meredith Leeds, MD  09/25/2020, 1:13 PM

## 2020-09-25 NOTE — Progress Notes (Signed)
Occupational Therapy Treatment Patient Details Name: Kathryn Garrett MRN: [id number] DOB: 1945-11-13 Today's Date: 09/25/2020    History of present illness 75 y.o. female with history of CAD with VF arrest s/p PCI to the RCA in 2017 s/p PTCA to the RCA for ISR in 2018, chronic combined systolic and diastolic CHF, COPD with ongoing tobacco use, HTN, HLD, anemia, and narcotic abuse on Suboxone therapy admitted with acute hypoxic respiratory failure requiring mechanical ventilation (extubated 09/17/2020) secondary to COPD exacerbation Klebsiella PNA and acute on chronic combined CHF with transient ST elevation and echo demonstrating new LV dysfunction.   OT comments  Pt seen for OT treatment this date to f/u re: safety with ADLs/ADL mobility. Pt able to come to EOB sitting with increased time, HOB elevated and MIN A. Pt demos G static sitting balance. Pt requires MOD A to son socks in sitting d/t limited dynamic sitting balance and LB strength/ROM. OT educates re: stretches to perform in sitting to preserve strength/ROM for hip and knee to regain INDEP with LB ADLs. Pt with moderate reception, will require continued ed. OT engages pt in contralateral reaching and seated tricep push ups (modified) to improve UB strength for tolerating standing with RW. OT assists pt with MIN A with RW in SPS from EOB to chair. Pt's HR up to high 140s with transfer and requires ~20-30 seconds of seated rest break to decrease to 110s (where she started session). RN notified of HR information. In addition, pt's spO2 >92% throughout on RA. Pt left in chair to eat lunch. All needs met and in reach. Chair alarm set. Will continue to follow. Pt demos improved tolerance for therapy sessions. Continue to consider pt reasonable candidate for CIR to rehabilitate ADL/ADL mobility skills to improve safety and give pt best opportunity to improve toward her functional baseline.   Follow Up Recommendations  CIR    Equipment  Recommendations  3 in 1 bedside commode;Tub/shower seat    Recommendations for Other Services      Precautions / Restrictions Precautions Precautions: Fall Restrictions Weight Bearing Restrictions: No       Mobility Bed Mobility Overal bed mobility: Needs Assistance Bed Mobility: Supine to Sit     Supine to sit: Min assist;HOB elevated Sit to supine: Supervision;HOB elevated   General bed mobility comments: increased time    Transfers Overall transfer level: Needs assistance Equipment used: Rolling walker (2 wheeled) Transfers: Sit to/from [facility] Sit to Stand: Min assist Stand pivot transfers: Min assist       General transfer comment: increased time, cues for safe hand placement/sequence    Balance Overall balance assessment: Needs assistance Sitting-balance support: Feet supported Sitting balance-Leahy Scale: Good Sitting balance - Comments: G static sitting, F/P dynamic for reaching outside BOS   Standing balance support: Bilateral upper extremity supported;During functional activity Standing balance-Leahy Scale: Fair Standing balance comment: B UE support                           ADL either performed or assessed with clinical judgement   ADL Overall ADL's : Needs assistance/impaired     Grooming: Wash/dry face;Oral care;Set up;Sitting               Lower Body Dressing: Moderate assistance;Sitting/lateral leans Lower Body Dressing Details (indicate cue type and reason): to don socks  Vision Patient Visual Report: No change from baseline     Perception     Praxis      Cognition Arousal/Alertness: Awake/alert Behavior During Therapy: WFL for tasks assessed/performed Overall Cognitive Status: Impaired/Different from baseline Area of Impairment: Attention;Memory;Safety/judgement;Awareness;Following commands;Problem solving                 Orientation Level: Disoriented  to;Time   Memory: Decreased short-term memory;Decreased recall of precautions Following Commands: Follows one step commands consistently;Follows multi-step commands with increased time Safety/Judgement: Decreased awareness of safety;Decreased awareness of deficits Awareness: Intellectual Problem Solving: Slow processing;Decreased initiation;Requires tactile cues;Requires verbal cues General Comments: some decreased recall, increased processing time requried, but overall able to follow commands and oriented to all aspects except time        Exercises Other Exercises Other Exercises: OT engages pt in seated dressing tasks with MOD A. pt requires MIN A for STS and SPS with RW. Pt transferred to chair. OT engages pt in UB therex to increase UB strength for fall prevention while utilizing UB support on RW.   Shoulder Instructions       General Comments max HR of 146 bpm during session, SpO2 >90% on room air, reviewed use of incentive spirometer with pt    Pertinent Vitals/ Pain       Pain Assessment: No/denies pain  Home Living                                          Prior Functioning/Environment              Frequency  Min 3X/week        Progress Toward Goals  OT Goals(current goals can now be found in the care plan section)  Progress towards OT goals: Progressing toward goals  Acute Rehab OT Goals Patient Stated Goal: get better so I can get home OT Goal Formulation: With patient/family Time For Goal Achievement: 10/03/20 Potential to Achieve Goals: Good  Plan Discharge plan remains appropriate;Frequency needs to be updated    Co-evaluation                 AM-PAC OT "6 Clicks" Daily Activity     Outcome Measure   Help from another person eating meals?: A Little Help from another person taking care of personal grooming?: A Little Help from another person toileting, which includes using toliet, bedpan, or urinal?: A Lot Help from  another person bathing (including washing, rinsing, drying)?: A Lot Help from another person to put on and taking off regular upper body clothing?: A Little Help from another person to put on and taking off regular lower body clothing?: A Lot 6 Click Score: 15    End of Session Equipment Utilized During Treatment: Gait belt;Rolling walker  OT Visit Diagnosis: Unsteadiness on feet (R26.81);Muscle weakness (generalized) (M62.81);Other symptoms and signs involving cognitive function   Activity Tolerance Patient tolerated treatment well   Patient Left in chair;with call bell/phone within reach;with chair alarm set   Nurse Communication Mobility status;Other (comment) (HR up to 140s with transfer)        Time: 4008-6761 OT Time Calculation (min): 38 min  Charges: OT General Charges $OT Visit: 1 Visit OT Treatments $Self Care/Home Management : 8-22 mins $Therapeutic Activity: 8-22 mins $Therapeutic Exercise: 8-22 mins  Gerrianne Scale, MS, OTR/L ascom [phone] 09/25/20, 5:23 PM

## 2020-09-26 DIAGNOSIS — J441 Chronic obstructive pulmonary disease with (acute) exacerbation: Secondary | ICD-10-CM | POA: Diagnosis not present

## 2020-09-26 LAB — CBC WITH DIFFERENTIAL/PLATELET
Abs Immature Granulocytes: 0.07 10*3/uL (ref 0.00–0.07)
Basophils Absolute: 0.1 10*3/uL (ref 0.0–0.1)
Basophils Relative: 0 %
Eosinophils Absolute: 0.2 10*3/uL (ref 0.0–0.5)
Eosinophils Relative: 1 %
HCT: 44.3 % (ref 36.0–46.0)
Hemoglobin: 13.9 g/dL (ref 12.0–15.0)
Immature Granulocytes: 0 %
Lymphocytes Relative: 26 %
Lymphs Abs: 4.7 10*3/uL — ABNORMAL HIGH (ref 0.7–4.0)
MCH: 30.1 pg (ref 26.0–34.0)
MCHC: 31.4 g/dL (ref 30.0–36.0)
MCV: 95.9 fL (ref 80.0–100.0)
Monocytes Absolute: 0.9 10*3/uL (ref 0.1–1.0)
Monocytes Relative: 5 %
Neutro Abs: 12 10*3/uL — ABNORMAL HIGH (ref 1.7–7.7)
Neutrophils Relative %: 68 %
Platelets: 336 10*3/uL (ref 150–400)
RBC: 4.62 MIL/uL (ref 3.87–5.11)
RDW: 15.4 % (ref 11.5–15.5)
WBC: 17.8 10*3/uL — ABNORMAL HIGH (ref 4.0–10.5)
nRBC: 0 % (ref 0.0–0.2)

## 2020-09-26 LAB — BASIC METABOLIC PANEL
Anion gap: 10 (ref 5–15)
BUN: 32 mg/dL — ABNORMAL HIGH (ref 8–23)
CO2: 20 mmol/L — ABNORMAL LOW (ref 22–32)
Calcium: 8.8 mg/dL — ABNORMAL LOW (ref 8.9–10.3)
Chloride: 105 mmol/L (ref 98–111)
Creatinine, Ser: 0.52 mg/dL (ref 0.44–1.00)
GFR, Estimated: >60 mL/min (ref >60)
Glucose, Bld: 103 mg/dL — ABNORMAL HIGH (ref 70–99)
Potassium: 3.8 mmol/L (ref 3.5–5.1)
Sodium: 135 mmol/L (ref 135–145)

## 2020-09-26 LAB — GLUCOSE, CAPILLARY
Glucose-Capillary: 120 mg/dL — ABNORMAL HIGH (ref 70–99)
Glucose-Capillary: 112 mg/dL — ABNORMAL HIGH (ref 70–99)
Glucose-Capillary: 109 mg/dL — ABNORMAL HIGH (ref 70–99)
Glucose-Capillary: 132 mg/dL — ABNORMAL HIGH (ref 70–99)
Glucose-Capillary: 86 mg/dL (ref 70–99)

## 2020-09-26 MED ORDER — ROSUVASTATIN CALCIUM 10 MG PO TABS
40.0000 mg | ORAL_TABLET | Freq: Every day | ORAL | Status: DC
  Administered 2020-09-26 – 2020-10-12 (×17): 40 mg via ORAL
  Filled 2020-09-26 (×15): qty 4

## 2020-09-26 NOTE — Progress Notes (Addendum)
Progress Note  Patient Name: Kathryn Garrett Date of Encounter: 09/26/2020  [facility] Cardiologist: Larae Grooms, MD   Subjective   No chest pain or shortness of breath. Eager to go home. Reports she was working with PT this morning with elevated rates in breathing but otherwise feels at her baseline.  Inpatient Medications    Scheduled Meds:  aspirin  81 mg Oral QHS   budesonide (PULMICORT) nebulizer solution  0.25 mg Nebulization BID   buprenorphine-naloxone  2 tablet Sublingual Daily   Chlorhexidine Gluconate Cloth  6 each Topical QHS   clopidogrel  75 mg Oral QHS   enoxaparin (LOVENOX) injection  40 mg Subcutaneous Q24H   feeding supplement (NEPRO CARB STEADY)  237 mL Oral TID BM   fluticasone  1 spray Each Nare Daily   insulin aspart  0-15 Units Subcutaneous Q4H   levothyroxine  88 mcg Oral Q0600   loratadine  10 mg Oral Daily   mouth rinse  15 mL Mouth Rinse BID   metoprolol succinate  50 mg Oral Daily   multivitamin with minerals  1 tablet Oral Daily   pantoprazole  40 mg Oral Daily   rosuvastatin  20 mg Oral QHS   sacubitril-valsartan  1 tablet Oral BID   sodium chloride flush  3 mL Intravenous Q12H   sodium chloride flush  3 mL Intravenous Q12H   Continuous Infusions:  sodium chloride Stopped (09/24/20 1321)   PRN Meds: acetaminophen **OR** acetaminophen, albuterol, artificial tears, docusate, guaiFENesin, metoprolol tartrate, morphine injection, polyethylene glycol   Vital Signs    Vitals:   09/25/20 1948 09/26/20 0419 09/26/20 0815 09/26/20 1130  BP: 135/62 (!) 123/55 124/73 107/62  Pulse:   97 92  Resp: 20 20 18 18   Temp: 97.7 F (36.5 C) 97.9 F (36.6 C) 97.7 F (36.5 C) 97.7 F (36.5 C)  TempSrc: Oral Oral    SpO2: 100% 93% 96% 96%  Weight:      Height:        Intake/Output Summary (Last 24 hours) at 09/26/2020 1156 Last data filed at 09/26/2020 1129 Gross per 24 hour  Intake 595 ml  Output 1350 ml  Net -755 ml   Last 3  Weights 09/25/2020 09/24/2020 09/24/2020  Weight (lbs) 126 lb 3.2 oz 136 lb 1.6 oz 123 lb 7.3 oz  Weight (kg) 57.244 kg 61.735 kg 56 kg      Telemetry    NSR-ST with rates into the 130s around 10 AM and during PT-personally reviewed  ECG    None new  Physical Exam   GEN: Well nourished, well developed, in no acute distress  HEENT: normal  Neck: no JVD, carotid bruits, or masses Cardiac: RRR; no murmurs, rubs, or gallops,no edema  Respiratory:  clear to auscultation bilaterally, normal work of breathing GI: soft, nontender, nondistended, + BS MS: no deformity or atrophy  Skin: warm and dry Neuro:  Strength and sensation are intact Psych: euthymic mood, full affect   Labs    High Sensitivity Troponin:   Recent Labs  Lab 09/10/20 2050 09/11/20 0437 09/11/20 1433 09/11/20 1611 09/11/20 2011  TROPONINIHS 1,838* 2,434* 2,265* 2,455* 2,317*      Chemistry Recent Labs  Lab 09/23/20 0426 09/24/20 0430 09/25/20 0328  NA 140 138 137  K 3.9 3.6 3.7  CL 107 108 106  CO2 23 21* 23  GLUCOSE 98 102* 87  BUN 44* 36* 31*  CREATININE 0.51 0.43* 0.53  CALCIUM 8.7* 8.6* 9.0  GFRNONAA >60 >60 >60  ANIONGAP 10 9 8      Hematology Recent Labs  Lab 09/24/20 0430 09/25/20 0328 09/26/20 0404  WBC 18.7* 17.8* 17.8*  RBC 4.15 4.22 4.62  HGB 12.5 12.9 13.9  HCT 40.1 40.4 44.3  MCV 96.6 95.7 95.9  MCH 30.1 30.6 30.1  MCHC 31.2 31.9 31.4  RDW 15.3 15.3 15.4  PLT 296 327 336    BNPNo results for input(s): BNP, PROBNP in the last 168 hours.   DDimer No results for input(s): DDIMER in the last 168 hours.   Radiology    No results found.  Cardiac Studies   [facility] 09/24/20 Conclusions: Mild to moderate, non-obstructive coronary artery disease including 20-35% distal LMCA and 50% ostial LAD disease, similar to prior catheterization when FFR was not hemodynamically significant. Patent proximal RCA stents with mild to moderate in-stent restenosis (30-40%). Normal left and  right heart filling pressures. Normal pulmonary artery pressures. Normal cardiac output/index.  Recommendations: Maintain net even fluid balance. Optimize goal-directed medical therapy with plans to repeat limited echo in 4-6 weeks. Continue secondary prevention of coronary artery disease. Right Heart  Right Heart Pressures RA (mean): 7 mmHg RV (S/EDP): 40/6 mmHg PA (S/D, mean): 31/15 (20) mmHg PCWP (mean): 11 mmHg  Ao sat: 98% PA sat: 76%  Fick CO: 3.8 L/min Fick CI: 2.6 L/min/m^2  Thermodilution CO: 3.9 L/min Thermodilution CI: 2.6 L/min/m^2    Left Heart  Left Ventricle LV end diastolic pressure is normal. LVEDP 15 mmHg.  Aortic Valve There is no aortic valve stenosis.   Coronary Diagrams   Diagnostic Dominance: Right         2D echo 09/11/2020: 1. Recommend limited echo with iv contrast agent (definity) for addequate  assessment of LVEF and wall motion.. Left ventricular ejection fraction,  by estimation, is 25 to 30%. The left ventricle has severely decreased  function. Left ventricular  endocardial border not optimally defined to evaluate regional wall motion.  Left ventricular diastolic parameters are indeterminate.   2. Right ventricular systolic function is normal. The right ventricular  size is normal.   3. The mitral valve was not well visualized. Mild mitral valve  regurgitation.   4. The aortic valve was not well visualized. Aortic valve regurgitation  is not visualized.  __________   LHC 05/2016: Ost RCA lesion, 30 %stenosed. LM lesion, 30 %stenosed. Ost LAD lesion, 50 %stenosed - looks similar to prior catheterization. FFR 0.87 The left ventricular systolic function is normal. LV end diastolic pressure is normal. The left ventricular ejection fraction is 55-65% by visual estimate. There is mild (2+) mitral regurgitation. _______CULPRIT LESION_____________ Prox RCA Overlapped BMS-DES stents, 99 % -n-stent re-stenosed. PTCA with 3.0 mm  Scoring Balloon with Post-dilation using 3.5 mm [place] Balloon was perfromed. Post intervention, there is a 0% residual stenosis.   Recurrent in-stent restenosis of the overlapped stent (BMS and DES) in proximal RCA treated with aggressive angioplasty. Repeat FFR on ostial LAD confirms this is not likely significant.     Plan: She'll be transferred to 6 [facility] post procedure unit for sheath removal and post PCI care. Continue aggressive risk factor modification Continue aspirin and Plavix lifelong Smoking cessation counseling is mandatory to maintain stent patency __________   LHC 01/2016: Ost LAD to Prox LAD lesion, 40 %stenosed. Similar to prior cath. The left ventricular systolic function is normal. LV end diastolic pressure is normal. The left ventricular ejection fraction is 55-65% by visual estimate. There is no aortic valve stenosis.  Prox RCA lesion, 75 %stenosed, in-stent restenosis. Post intervention with 3.0 x 10 Angiosculpt inflated to 3.5 mm, there is a 0% residual stenosis.   Continue aggressive secondary prevention.  Clopidogrel restarted. _________   2D echo 09/2015: - Left ventricle: The cavity size was normal. Systolic function was    normal. The estimated ejection fraction was in the range of 60%    to 65%. Wall motion was normal; there were no regional wall    motion abnormalities. Doppler parameters are consistent with    abnormal left ventricular relaxation (grade 1 diastolic    dysfunction).  - Right ventricle: Systolic function was normal.  - Pulmonary arteries: Systolic pressure was within the normal    range.   Impressions:   - Normal study.  Patient Profile     75 y.o. female history of CAD with VF arrest s/p PCI to the RCA in 2017 s/p PTCA to the RCA for ISR in 2018, chronic combined systolic and diastolic CHF, COPD with ongoing tobacco use, HTN, HLD, anemia, and narcotic abuse on Suboxone therapy admitted with acute hypoxic respiratory failure  requiring mechanical ventilation secondary to COPD exacerbation Klebsiella PNA and acute on chronic combined CHF with transient ST elevation with subsequent rise on HS-Tn to 2455 and echo demonstrating new LV dysfunction requiring augmentation with milrinone for diuresis. Extubated 5/6. R/LHC 5/13, and seen for heart failure and NSTEMI.  Assessment & Plan    Acute on chronic systolic heart failure --No SOB.  --S/p R/LHC as above. --Output -1.3 L yesterday with total net since 09/12/2020 -941 cc. --Cr 0.43  0.53. Pending BMET today. --Not currently on a diuretic. Malikah Principato continue to hold and reassess at RTC. --Continue current medications, including increased dose BB. She had sinus tachycardia with rates into the 130s this morning with PT again but current hypotension precludes escalation of Toprol XL 50 mg dose further.  Reassess in clinic.  Continue Entresto.  Consider addition of SGLT2 inhibitor/spironolactone at follow-up as BP/creatinine/potassium allows.  NSTEMI -- No chest pain.  History of coronary artery disease s/p left heart catheterization with no acute lesion as above and 5/13 this admission.  Continue DAPT with ASA and Plavix.  Continue Toprol-XL.  As needed sublingual nitro for chest pain.  Yatziry Deakins increase to Crestor 40 mg daily for optimized triglyceride control.  Previous LDL 43 and at goal of below 70.  Acute respiratory failure with hypoxia -- As previously noted, likely secondary to pneumonia and COPD exacerbation.  Per IM.  SVT -- No further SVT noted.  Continue Toprol.  HLD/Tg --Increased to Crestor 40mg  daily as above.  Recommend recheck of lipids and liver function in 6 to 8 weeks.   For questions or updates, please contact [place] Please consult [email or web address] for contact info under [facility] Card[facility].     Signed, Arvil Chaco, PA-C  09/26/2020, 11:56 AM    I have seen and examined this patient with Marrianne Mood.  Agree with above, note added to reflect my  findings.  On exam, regular rhythm, no murmurs, lungs clear.  Patient continues to do well.  No evidence of volume overload.  We Kazuma Elena continue with current management.  Patient states that she Fabrizio Filip likely go to rehab tomorrow.  Cardiology to sign off at this time.  No medication changes at this time.  We Nussen Pullin arrange for follow-up in cardiology clinic posthospitalization.  Jeydan Barner M. Caddie Randle MD 09/26/2020 12:32 PM

## 2020-09-26 NOTE — Plan of Care (Signed)
  Problem: Health Behavior/Discharge Planning: Goal: Ability to manage health-related needs will improve Outcome: Progressing   Problem: Elimination: Goal: Will not experience complications related to bowel motility Outcome: Progressing   

## 2020-09-26 NOTE — TOC Progression Note (Signed)
Transition of Care [facility]) - Progression Note    Patient Details  Name: Kathryn Garrett MRN: [id number] Date of Birth: Mar 23, 1946  [facility] CM/SW Contact  Beverly Sessions, RN Phone Number: 09/26/2020, 12:04 PM  Clinical Narrative:     Josem Kaufmann for CIR pending     Barriers to Discharge: Continued Medical Work up  Expected Discharge Plan and Services   In-house Referral: Clinical Social Work   Post Acute Care Choice:  [facility]) Living arrangements for the past 2 months: Single Family Home                                       Social Determinants of Health (SDOH) Interventions    Readmission Risk Interventions No flowsheet data found.

## 2020-09-26 NOTE — Progress Notes (Signed)
PROGRESS NOTE    Kathryn Garrett  QMG:[id number] DOB: [birth year] DOA: 09/10/2020 PCP: Imagene Riches, NP  Brief Narrative:  74/F presented to the [facility] ED from home with complaints of shortness of breath -Admitted on 4/29 with hypoxic respiratory failure secondary to COPD exacerbation and community-acquired pneumonia -Hospital course was complicated by flash pulmonary edema and NSTEMI requiring mechanical ventilation, intubated on 4/30 -Treated with antibiotics, steroids and diuretics -Eventually extubated on 5/7 [facility] course further complicated by encephalopathy -Cardiology following, underwent left heart cath which noted nonobstructive CAD, medical management recommended -PT eval completed, plan for discharge to [place]:    Acute hypoxic respiratory failure  Acute COPD Exacerbation/left lung multifocal pneumonia Flash pulmonary edema on 4/30 -Intubated for flash pulmonary edema on, extubated 5/7 -Clinically improving, completed antibiotic course -Treated with diuretics as well -Improved and stable, weaned off O2 -Discharge planning, plan for CIR pending insurance authorization  Sepsis, left lower lobe pneumonia -tracheal aspirate on 4/30 with KLEBSIELLA PNEUMONIAE (resistant to Ampicillin) -Completed 7 days of antibiotics. -Improving -SLP following, advance diet as tolerated  Acute systolic congestive heart failure/Flash pulmonary edema NSTEMI Paroxysmal SVT -Echo noted with drop in EF down to 30%  -Previously on inotropes in ICU, weaned off, also treated with IV heparin briefly  -Cardiology following, continue metoprolol,  Entresto, diuretics on hold -Also on aspirin Plavix -Left heart cath with nonobstructive CAD, recommended medical management, plan for repeat echo in 2 to 3 months -Appears euvolemic  Acute metabolic/toxic encephalopathy -ICU stay complicated by severe encephalopathy while on vent -At baseline on buprenorphine/naloxone which  was held on admission, now resumed -Also had an MRI brain which was unremarkable -Improved and stable now, PT OT as tolerated -CIR recommended for rehab  COPD Ongoing tobacco abuse -Counseled, taper off prednisone, continue duo nebs  DVT prophylaxis: Lovenox Code Status: Full Family Communication: No family at bedside, discussed with daughter previously Disposition Plan: Status is: Inpatient  Remains inpatient appropriate because:Inpatient level of care appropriate due to severity of illness   Dispo: The patient is from: Home              Anticipated d/c is to:  CIR hopefully on Monday              Patient currently is medically stable to d/c.   Difficult to place patient No Consultants:   Cardiology   Procedures: Endotracheal intubation Left heart catheterization PROCEDURE:  Procedure(s): RIGHT/LEFT HEART CATH AND CORONARY ANGIOGRAPHY (N/A)  SURGEON:  Surgeon(s) and Role:    * End, Harrell Gave, MD - Primary  FINDINGS: 1. Nonobstructive CAD. 2. Normal left and right heart filling pressures. 3. Normal cardiac output/index.  RECOMMENDATIONS: 1. Escalate GDMT at tolerated. 2. Secondary prevention of coronary artery disease    Antimicrobials:  None   Subjective: -Feels better overall, breathing is improving, intermittent cough  Objective: Vitals:   09/25/20 1948 09/26/20 0419 09/26/20 0815 09/26/20 1130  BP: 135/62 (!) 123/55 124/73 107/62  Pulse:   97 92  Resp: 20 20 18 18   Temp: 97.7 F (36.5 C) 97.9 F (36.6 C) 97.7 F (36.5 C) 97.7 F (36.5 C)  TempSrc: Oral Oral    SpO2: 100% 93% 96% 96%  Weight:      Height:        Intake/Output Summary (Last 24 hours) at 09/26/2020 1230 Last data filed at 09/26/2020 1129 Gross per 24 hour  Intake 595 ml  Output 1350 ml  Net -755 ml  Filed Weights   09/24/20 0413 09/24/20 1240 09/25/20 0711  Weight: 56 kg 61.7 kg 57.2 kg    Examination:  General exam: Pleasant chronically ill female sitting up in  bed, AAOx3, no distress CVS: S1-S2, regular rate rhythm Lungs: Few scattered basilar rhonchi, otherwise clear Abdomen: Soft, nontender, bowel sounds present Extremities: No edema Skin: No rashes on exposed skin  Psychiatry: Appropriate mood and affect   Data Reviewed: I have personally reviewed following labs and imaging studies  CBC: Recent Labs  Lab 09/22/20 0418 09/23/20 0426 09/24/20 0430 09/25/20 0328 09/26/20 0404  WBC 19.3* 19.5* 18.7* 17.8* 17.8*  NEUTROABS 12.3* 13.1* 12.5* 11.4* 12.0*  HGB 13.5 12.3 12.5 12.9 13.9  HCT 44.3 40.1 40.1 40.4 44.3  MCV 101.1* 98.8 96.6 95.7 95.9  PLT 279 263 296 327 270   Basic Metabolic Panel: Recent Labs  Lab 09/20/20 0356 09/21/20 0206 09/22/20 0418 09/23/20 0426 09/24/20 0430 09/25/20 0328  NA 145 147* 146* 140 138 137  K 3.0* 3.7 4.4 3.9 3.6 3.7  CL 107 112* 112* 107 108 106  CO2 25 25 25 23  21* 23  GLUCOSE 124* 127* 98 98 102* 87  BUN 68* 73* 57* 44* 36* 31*  CREATININE 0.66 0.65 0.56 0.51 0.43* 0.53  CALCIUM 9.1 9.4 9.5 8.7* 8.6* 9.0  MG 2.7* 2.5* 2.5*  --   --   --   PHOS 4.2 3.6 3.5  --   --   --    GFR: Estimated Creatinine Clearance: 46.8 mL/min (by C-G formula based on SCr of 0.53 mg/dL). Liver Function Tests: No results for input(s): AST, ALT, ALKPHOS, BILITOT, PROT, ALBUMIN in the last 168 hours. No results for input(s): LIPASE, AMYLASE in the last 168 hours. No results for input(s): AMMONIA in the last 168 hours. Coagulation Profile: No results for input(s): INR, PROTIME in the last 168 hours. Cardiac Enzymes: No results for input(s): CKTOTAL, CKMB, CKMBINDEX, TROPONINI in the last 168 hours. BNP (last 3 results) No results for input(s): PROBNP in the last 8760 hours. HbA1C: No results for input(s): HGBA1C in the last 72 hours. CBG: Recent Labs  Lab 09/25/20 1919 09/25/20 2322 09/26/20 0351 09/26/20 0813 09/26/20 1128  GLUCAP 128* 109* 120* 112* 109*   Lipid Profile: No results for input(s):  CHOL, HDL, LDLCALC, TRIG, CHOLHDL, LDLDIRECT in the last 72 hours. Thyroid Function Tests: No results for input(s): TSH, T4TOTAL, FREET4, T3FREE, THYROIDAB in the last 72 hours. Anemia Panel: No results for input(s): VITAMINB12, FOLATE, FERRITIN, TIBC, IRON, RETICCTPCT in the last 72 hours. Sepsis Labs: No results for input(s): PROCALCITON, LATICACIDVEN in the last 168 hours.  No results found for this or any previous visit (from the past 240 hour(s)).   Scheduled Meds: . aspirin  81 mg Oral QHS  . budesonide (PULMICORT) nebulizer solution  0.25 mg Nebulization BID  . buprenorphine-naloxone  2 tablet Sublingual Daily  . Chlorhexidine Gluconate Cloth  6 each Topical QHS  . clopidogrel  75 mg Oral QHS  . enoxaparin (LOVENOX) injection  40 mg Subcutaneous Q24H  . feeding supplement (NEPRO CARB STEADY)  237 mL Oral TID BM  . fluticasone  1 spray Each Nare Daily  . insulin aspart  0-15 Units Subcutaneous Q4H  . levothyroxine  88 mcg Oral Q0600  . loratadine  10 mg Oral Daily  . mouth rinse  15 mL Mouth Rinse BID  . metoprolol succinate  50 mg Oral Daily  . multivitamin with minerals  1 tablet Oral  Daily  . pantoprazole  40 mg Oral Daily  . rosuvastatin  40 mg Oral QHS  . sacubitril-valsartan  1 tablet Oral BID  . sodium chloride flush  3 mL Intravenous Q12H  . sodium chloride flush  3 mL Intravenous Q12H   Continuous Infusions: . sodium chloride Stopped (09/24/20 1321)    LOS: 16 days   Time spent: 25 minutes  Domenic Polite, MD [facility]  09/26/2020, 12:30 PM

## 2020-09-26 NOTE — Progress Notes (Signed)
Physical Therapy Treatment Patient Details Name: Kathryn Garrett MRN: [id number] DOB: 10-26-1945 Today's Date: 09/26/2020    History of Present Illness 75 y.o. female with history of CAD with VF arrest s/p PCI to the RCA in 2017 s/p PTCA to the RCA for ISR in 2018, chronic combined systolic and diastolic CHF, COPD with ongoing tobacco use, HTN, HLD, anemia, and narcotic abuse on Suboxone therapy admitted with acute hypoxic respiratory failure requiring mechanical ventilation (extubated 09/17/2020) secondary to COPD exacerbation Klebsiella PNA and acute on chronic combined CHF with transient ST elevation and echo demonstrating new LV dysfunction.    PT Comments    " I stink"  Pt awaiting bath from tech.  Assisted with care.  To EOB with min assist.  Steady in sitting and she is able to participate in upper body bathing with no LOB.  She does need cues to complete task but overall does well.  Stood with mod a x 1 to RW for pericare.  She requires mod a x 1 at all times to remain standing and is unable to release hand to assist in task so full assist for lower body bathing is provided in standing.  Sits x 2 during task to rest.  Stand pivot transfer to recliner at end of session with mod a x 1.  Remained in recliner with needs met.     Follow Up Recommendations  CIR     Equipment Recommendations  None recommended by PT    Recommendations for Other Services Rehab consult     Precautions / Restrictions Precautions Precautions: Fall Restrictions Weight Bearing Restrictions: No    Mobility  Bed Mobility Overal bed mobility: Needs Assistance Bed Mobility: Supine to Sit     Supine to sit: Min assist;HOB elevated          Transfers Overall transfer level: Needs assistance Equipment used: Rolling walker (2 wheeled) Transfers: Sit to/from [facility] Sit to Stand: Min assist Stand pivot transfers: Min assist;Mod assist       General transfer comment: increased  time, cues for safe hand placement/sequence  Ambulation/Gait             General Gait Details: seemed more fatigued today only able to tolerate short standing time with walker.   Stairs             Wheelchair Mobility    Modified Rankin (Stroke Patients Only)       Balance Overall balance assessment: Needs assistance Sitting-balance support: Feet supported Sitting balance-Leahy Scale: Good     Standing balance support: Bilateral upper extremity supported;During functional activity Standing balance-Leahy Scale: Poor Standing balance comment: mod assist to remains uprigth for bathing care.                            Cognition Arousal/Alertness: Awake/alert Behavior During Therapy: WFL for tasks assessed/performed Overall Cognitive Status: Within Functional Limits for tasks assessed                                        Exercises      General Comments General comments (skin integrity, edema, etc.): HR up to 128 with activity      Pertinent Vitals/Pain Pain Assessment: No/denies pain    Home Living  Prior Function            PT Goals (current goals can now be found in the care plan section) Progress towards PT goals: Progressing toward goals    Frequency    7X/week      PT Plan Current plan remains appropriate    Co-evaluation              AM-PAC PT "6 Clicks" Mobility   Outcome Measure  Help needed turning from your back to your side while in a flat bed without using bedrails?: A Little Help needed moving from lying on your back to sitting on the side of a flat bed without using bedrails?: A Little Help needed moving to and from a bed to a chair (including a wheelchair)?: A Lot Help needed standing up from a chair using your arms (e.g., wheelchair or bedside chair)?: A Lot Help needed to walk in hospital room?: A Lot Help needed climbing 3-5 steps with a railing? : Total 6  Click Score: 13    End of Session Equipment Utilized During Treatment: Gait belt Activity Tolerance: Patient limited by fatigue Patient left: in chair;with call bell/phone within reach;with chair alarm set Nurse Communication: Mobility status PT Visit Diagnosis: Difficulty in walking, not elsewhere classified (R26.2);Muscle weakness (generalized) (M62.81);Unsteadiness on feet (R26.81)     Time: 2230-0979 PT Time Calculation (min) (ACUTE ONLY): 23 min  Charges:  $Therapeutic Activity: 23-37 mins                    Chesley Noon, PTA 09/26/20, 9:56 AM

## 2020-09-26 NOTE — Progress Notes (Addendum)
Pt was on dysphasia 3 diet thin liquid, but pt unable to tolerate thin liquid even with a small sip. NP ouma made aware, Will continue to monitor.  Update 0523: Pt diet was switched back to dysphasia 3 diet nectar thick per Ouma NP order. Will continue to monitor.

## 2020-09-27 ENCOUNTER — Inpatient Hospital Stay: Payer: Medicare HMO

## 2020-09-27 DIAGNOSIS — J441 Chronic obstructive pulmonary disease with (acute) exacerbation: Secondary | ICD-10-CM | POA: Diagnosis not present

## 2020-09-27 LAB — CBC WITH DIFFERENTIAL/PLATELET
Abs Immature Granulocytes: 0.06 10*3/uL (ref 0.00–0.07)
Basophils Absolute: 0.1 10*3/uL (ref 0.0–0.1)
Basophils Relative: 0 %
Eosinophils Absolute: 0.1 10*3/uL (ref 0.0–0.5)
Eosinophils Relative: 1 %
HCT: 40.8 % (ref 36.0–46.0)
Hemoglobin: 13.3 g/dL (ref 12.0–15.0)
Immature Granulocytes: 0 %
Lymphocytes Relative: 25 %
Lymphs Abs: 4 10*3/uL (ref 0.7–4.0)
MCH: 30.9 pg (ref 26.0–34.0)
MCHC: 32.6 g/dL (ref 30.0–36.0)
MCV: 94.7 fL (ref 80.0–100.0)
Monocytes Absolute: 0.8 10*3/uL (ref 0.1–1.0)
Monocytes Relative: 5 %
Neutro Abs: 11.1 10*3/uL — ABNORMAL HIGH (ref 1.7–7.7)
Neutrophils Relative %: 69 %
Platelets: 346 10*3/uL (ref 150–400)
RBC: 4.31 MIL/uL (ref 3.87–5.11)
RDW: 15 % (ref 11.5–15.5)
WBC: 16 10*3/uL — ABNORMAL HIGH (ref 4.0–10.5)
nRBC: 0 % (ref 0.0–0.2)

## 2020-09-27 LAB — GLUCOSE, CAPILLARY
Glucose-Capillary: 116 mg/dL — ABNORMAL HIGH (ref 70–99)
Glucose-Capillary: 118 mg/dL — ABNORMAL HIGH (ref 70–99)
Glucose-Capillary: 120 mg/dL — ABNORMAL HIGH (ref 70–99)
Glucose-Capillary: 120 mg/dL — ABNORMAL HIGH (ref 70–99)
Glucose-Capillary: 135 mg/dL — ABNORMAL HIGH (ref 70–99)
Glucose-Capillary: 141 mg/dL — ABNORMAL HIGH (ref 70–99)

## 2020-09-27 LAB — BASIC METABOLIC PANEL
Anion gap: 9 (ref 5–15)
BUN: 30 mg/dL — ABNORMAL HIGH (ref 8–23)
CO2: 22 mmol/L (ref 22–32)
Calcium: 8.8 mg/dL — ABNORMAL LOW (ref 8.9–10.3)
Chloride: 105 mmol/L (ref 98–111)
Creatinine, Ser: 0.48 mg/dL (ref 0.44–1.00)
GFR, Estimated: >60 mL/min (ref >60)
Glucose, Bld: 117 mg/dL — ABNORMAL HIGH (ref 70–99)
Potassium: 3.5 mmol/L (ref 3.5–5.1)
Sodium: 136 mmol/L (ref 135–145)

## 2020-09-27 NOTE — PMR Pre-admission (Shared)
PMR Admission Coordinator Pre-Admission Assessment  Patient: Kathryn Garrett is an 75 y.o., female MRN: [id number] DOB: [birth year] Height: 4\' 11"  (149.9 cm) Weight: 57.6 kg  Insurance Information HMO: yes    PPO:      PCP:      IPA:      80/20:      OTHER:  PRIMARY: [employer] Medicare      Policy#: [id number]      Subscriber: pt CM Name: Sherlean Foot      Phone#: [phone]     Fax#: [phone] Pre-Cert#: [id number]      Employer: 5/13 Benefits:  Phone #: [phone]     Name: 5/13 Eff. Date: 05/15/2020     Deduct: $233      Out of Pocket Max: none      Life Max: none CIR: $1556 co pay per admit/ Medicaid eligible      SNF: $194.50 per day Outpatient: 80%     Co-Pay: 20% Home Health: 100%      Co-Pay: per medical neccesity DME: 80%     Co-Pay: 20% Providers: in network  SECONDARY: Medicaid of [place]      Policy#: [id number]     Phone#: 5/16 per passport one source  Financial Counselor:       Phone#:   The "Data Collection Information Summary" for patients in Inpatient Rehabilitation Facilities with attached "Privacy Act [place] Records" was provided and verbally reviewed with: Patient and Family  Emergency Contact Information Contact Information    Name Relation Home Work [place] Daughter   [phone]   Lisabeth Devoid Daughter   [phone]   [facility] Sister [phone]        Current Medical History  Patient Admitting Diagnosis: Debility  History of Present Illness: 75 year old female with medical history significant for CAD s/p RCA stent with ensuing recurrent in-stent restenosis of RCA stent in 8341, Chronic diastolic heart failure, COPD, HTN, HLD with presented on 09/10/2020 to [facility] with acute COPD exacerbation and community acquired PNA.   Hospital course complicated by flash pulmonary edema and NSTEMI requiring mechanical ventilation . Treated with antibiotics, steroids and diuretics. Eventually extubated on 5/7. Hospital course complicated  by encephalopathy that has now resolved. Tracheal aspirate with Klebsiella Pneumonia resistant to ampicillin. Completed 7 days of antibiotics . Resultant dysphagia and upgraded to D 3 and nectar thick liquids. .   NSTEMI with echo drop in EF to 30%. Previously on inotrope's in ICU, weaned off and treated with IV heparin briefly. Cardiology consulted and underwent cardiac cath on 5/13 with nonobstructive CAD with plans for medical management.     Patient's [facility] has been reviewed by the rehabilitation admission coordinator and physician.  Past Medical History  Past Medical History:  Diagnosis Date  . Anemia, mild   . Arthritis    "qwhere" (02/10/2016)  . CAD (coronary artery disease)    a. VF arrest 01/2009/CAD with inferoposterior MI s/p aspiration thrombectomy/BMS of RCA at that time. b. ISR of BMS s/p DES to RCA 06/2010 with moderate LM/LAD disease not significant by FFR. c. s/p angiosculpt PTCA to prox RCA 01/2016 for ISR. d. Aggressive PTCA to prox RCA for ISR 05/2016.  . Cardiac arrest ([place]) 01/2009   a. in setting of inf-post STEMI 01/2009 (VF).  . Chest pain 06/04/2016  . Chronic bronchitis ([place])   . Chronic combined systolic and diastolic CHF (congestive heart failure) ([place])    a. EF 40-45% by cath 2010. b.  60-65% with grade 1 DD by echo 09/2015  . COPD (chronic obstructive pulmonary disease) ([place])   . Hyperlipidemia 01/28/2016  . Hyperlipidemia LDL goal <70 01/28/2016  . Hypertension   . Lung mass   . MI (myocardial infarction) ([place]) 01/2009  . Narcotic abuse ([place])    pt now taking Suboxone tid  . Pleural effusion on left 09/24/2015  . Pneumonia 06/2014; 09/2015  . Pre-diabetes   . Pulmonary nodule   . PVD (peripheral vascular disease) (HCC)    mild atherosclerosis of infrarenal aorta, 25% ostial left renal artery stenosis, 50% ostial right common iliac by cath 2010  . S/P PTCA (percutaneous transluminal coronary angioplasty) 02/10/16 to RCA lesion for in stent  [place] 02/11/2016  . Subclavian artery stenosis (HCC)    a. >50% by duplex 05/2016.  . Tobacco abuse   . Unstable angina (HCC)     Family History   family history includes Cancer in her brother; Coronary artery disease in her father and mother; Early death in her father; Heart attack in her father and mother; Hyperlipidemia in her father and mother.  Prior Rehab/Hospitalizations Has the patient had prior rehab or hospitalizations prior to admission? Yes  Has the patient had major surgery during 100 days prior to admission? Yes   Current Medications  Current Facility-Administered Medications:  .  0.9 %  sodium chloride infusion, 250 mL, Intravenous, Continuous, Rust-Chester, Britton L, NP, Stopped at 09/24/20 1321 .  acetaminophen (TYLENOL) tablet 650 mg, 650 mg, Oral, Q6H PRN, 650 mg at 09/23/20 2146 **OR** acetaminophen (TYLENOL) suppository 650 mg, 650 mg, Rectal, Q6H PRN, Sreenath, Sudheer B, MD .  albuterol (PROVENTIL) (2.5 MG/3ML) 0.083% nebulizer solution 2.5 mg, 2.5 mg, Nebulization, Q4H PRN, Tyna Jaksch, MD .  artificial tears (LACRILUBE) ophthalmic ointment, , Both Eyes, Q4H PRN, Bradly Bienenstock, NP, Given at 09/17/20 [id number] .  aspirin chewable tablet 81 mg, 81 mg, Oral, QHS, Sreenath, Sudheer B, MD, 81 mg at 09/26/20 2130 .  budesonide (PULMICORT) nebulizer solution 0.25 mg, 0.25 mg, Nebulization, BID, Rust-Chester, Britton L, NP, 0.25 mg at 09/27/20 0740 .  buprenorphine-naloxone (SUBOXONE) 2-0.5 mg per SL tablet 2 tablet, 2 tablet, Sublingual, Daily, Priscella Mann, Sudheer B, MD, 2 tablet at 09/27/20 1024 .  Chlorhexidine Gluconate Cloth 2 % PADS 6 each, 6 each, Topical, QHS, Ottie Glazier, MD, 6 each at 09/26/20 2131 .  clopidogrel (PLAVIX) tablet 75 mg, 75 mg, Oral, QHS, Sreenath, Sudheer B, MD, 75 mg at 09/26/20 2131 .  docusate (COLACE) 50 MG/5ML liquid 100 mg, 100 mg, Oral, BID PRN, Sreenath, Sudheer B, MD .  enoxaparin (LOVENOX) injection 40 mg, 40 mg,  Subcutaneous, Q24H, Domenic Polite, MD, 40 mg at 09/26/20 1805 .  feeding supplement (NEPRO CARB STEADY) liquid 237 mL, 237 mL, Oral, TID BM, Domenic Polite, MD, 237 mL at 09/27/20 1321 .  fluticasone (FLONASE) 50 MCG/ACT nasal spray 1 spray, 1 spray, Each Nare, Daily, Howerter, Justin B, DO, 1 spray at 09/27/20 1025 .  guaiFENesin (ROBITUSSIN) 100 MG/5ML solution 200 mg, 10 mL, Oral, Q4H PRN, Priscella Mann, Sudheer B, MD, 200 mg at 09/26/20 2208 .  insulin aspart (novoLOG) injection 0-15 Units, 0-15 Units, Subcutaneous, Q4H, Rust-Chester, Britton L, NP, 2 Units at 09/27/20 1320 .  levothyroxine (SYNTHROID) tablet 88 mcg, 88 mcg, Oral, Q0600, Ralene Muskrat B, MD, 88 mcg at 09/27/20 0510 .  loratadine (CLARITIN) tablet 10 mg, 10 mg, Oral, Daily, Sreenath, Sudheer B, MD, 10 mg at 09/27/20 1024 .  MEDLINE mouth  rinse, 15 mL, Mouth Rinse, BID, Rust-Chester, Britton L, NP, 15 mL at 09/27/20 1031 .  metoprolol succinate (TOPROL-XL) 24 hr tablet 50 mg, 50 mg, Oral, Daily, Camnitz, Will Hassell Done, MD, 50 mg at 09/27/20 1024 .  multivitamin with minerals tablet 1 tablet, 1 tablet, Oral, Daily, Priscella Mann, Sudheer B, MD, 1 tablet at 09/27/20 1024 .  pantoprazole (PROTONIX) EC tablet 40 mg, 40 mg, Oral, Daily, Sreenath, Sudheer B, MD, 40 mg at 09/27/20 1024 .  polyethylene glycol (MIRALAX / GLYCOLAX) packet 17 g, 17 g, Oral, Daily PRN, Tyna Jaksch, MD .  rosuvastatin (CRESTOR) tablet 40 mg, 40 mg, Oral, QHS, Visser, Jacquelyn D, PA-C, 40 mg at 09/26/20 2130 .  sacubitril-valsartan (ENTRESTO) 24-26 mg per tablet, 1 tablet, Oral, BID, Priscella Mann, Sudheer B, MD, 1 tablet at 09/27/20 1024 .  sodium chloride flush (NS) 0.9 % injection 3 mL, 3 mL, Intravenous, Q12H, Theora Gianotti, NP, 3 mL at 09/27/20 1030 .  sodium chloride flush (NS) 0.9 % injection 3 mL, 3 mL, Intravenous, Q12H, Visser, Jacquelyn D, PA-C, 3 mL at 09/27/20 1030  Patients Current Diet:  Diet Order            DIET DYS 3 Room service  appropriate? Yes; Fluid consistency: Honey Thick  Diet effective now                 Precautions / Restrictions Precautions Precautions: Fall Restrictions Weight Bearing Restrictions: No Other Position/Activity Restrictions: NG tube   Has the patient had 2 or more falls or a fall with injury in the past year? No  Prior Activity Level Community (5-7x/wk): independent and working as Personnel officer Level Self Care: Did the patient need help bathing, dressing, using the toilet or eating? Independent  Indoor Mobility: Did the patient need assistance with walking from room to room (with or without device)? Independent  Stairs: Did the patient need assistance with internal or external stairs (with or without device)? Independent  Functional Cognition: Did the patient need help planning regular tasks such as shopping or remembering to take medications? Independent  Home Assistive Devices / Equipment Home Assistive Devices/Equipment: Gilford Rile (specify type) Home Equipment: None  Prior Device Use: Indicate devices/aids used by the patient prior to current illness, exacerbation or injury? None of the above  Current Functional Level Cognition  Overall Cognitive Status: Within Functional Limits for tasks assessed Orientation Level: Oriented X4 Following Commands: Follows one step commands consistently,Follows multi-step commands with increased time Safety/Judgement: Decreased awareness of safety,Decreased awareness of deficits General Comments: some decreased recall, increased processing time requried, but overall able to follow commands and oriented to all aspects except time    Extremity Assessment (includes Sensation/Coordination)  Upper Extremity Assessment: Generalized weakness (on assessment, R weaker than L. Barely able to I'ly raise R UE against gravity, grip MMT grossly 3-/5. L able to minimally rasie agaist gravity ~2-3 inches from bed, grip grossly 3+/5)  Lower  Extremity Assessment: Generalized weakness    ADLs  Overall ADL's : Needs assistance/impaired Eating/Feeding: Minimal assistance,Set up,Bed level Grooming: Wash/dry face,Oral care,Set up,Sitting Upper Body Dressing : Minimal assistance,Sitting Upper Body Dressing Details (indicate cue type and reason): to don gown to back side like robe Lower Body Dressing: Moderate assistance,Sitting/lateral leans Lower Body Dressing Details (indicate cue type and reason): to don socks General ADL Comments: MAX A hand over hand for UB ADLs bed level, unable to raise UEs against gravity enough in sitting to attempt an UB ADLs and requires  UEs for seated support. TOTAL A for LB ADLs bed level.    Mobility  Overal bed mobility: Needs Assistance Bed Mobility: Supine to Sit,Sit to Supine Supine to sit: Min guard Sit to supine: Min guard General bed mobility comments: able to get to EOB and back to bed on her own today with rail for assist    Transfers  Overall transfer level: Needs assistance Equipment used: Rolling walker (2 wheeled) Transfers: Sit to/from [employer] Sit to Stand: Min assist Stand pivot transfers: Min assist,Mod assist General transfer comment: increased time, cues for safe hand placement/sequence    Ambulation / Gait / Stairs / Wheelchair Mobility  Ambulation/Gait Ambulation/Gait assistance: Herbalist (Feet): 4 Feet Assistive device: Rolling walker (2 wheeled) Gait Pattern/deviations: Step-to pattern General Gait Details: seemed more fatigued today only able to tolerate short standing time with walker. Gait velocity: decreased    Posture / Balance Dynamic Sitting Balance Sitting balance - Comments: G static sitting, F/P dynamic for reaching outside BOS Balance Overall balance assessment: Needs assistance Sitting-balance support: Feet supported Sitting balance-Leahy Scale: Good Sitting balance - Comments: G static sitting, F/P dynamic for  reaching outside BOS Standing balance support: Bilateral upper extremity supported,During functional activity Standing balance-Leahy Scale: Poor Standing balance comment: mod assist to remains uprigth for bathing care.    Special needs/care consideration {Special Care Needs/Care Considerations:304600603}   Previous Home Environment  Living Arrangements: Alone  Lives With: Alone Available Help at Discharge:  (to stay with a daughter in [place] at d/c) Type of Home: [place]: One level Home Access: Stairs to enter Entrance Stairs-Rails: Surveyor, mining of Steps: flight Bathroom Shower/Tub: Chiropodist: Standard Bathroom Accessibility: Yes How Accessible: Accessible via walker [place]: No  Discharge Living Setting Plans for Discharge Living Setting: Lives with (comment) (to go stay with a daughter in [place], [place]) Type of Home at Discharge: Other (Comment) Does the patient have any problems obtaining your medications?: No  Social/Family/Support Systems Contact Information: daughters Anticipated Caregiver: daughters Anticipated Caregiver's Contact Information: see above Caregiver Availability: 24/7 Discharge Plan Discussed with Primary Caregiver: Yes Is Caregiver In Agreement with Plan?: Yes Does Caregiver/Family have Issues with Lodging/Transportation while Pt is in Rehab?: No  Goals Patient/Family Goal for Rehab: supervision PT, supervision to min OT, supervision SLP Expected length of stay: ELOS 2 weeks Pt/Family Agrees to Admission and willing to participate: Yes Program Orientation Provided & Reviewed with Pt/Caregiver Including Roles  & Responsibilities: Yes  Decrease burden of Care through IP rehab admission: n/a  Possible need for SNF placement upon discharge: not antiicapted  Patient Condition: I have reviewed medical records from ***, spoken with {CHL IP CSW OJ:[id number], and {CHL IP Patient Spouse  Son Daughter Family Member:[id number]}. I {CHL IP at bedside or phone:304550005} for inpatient rehabilitation assessment.  Patient will benefit from ongoing {CHL IP PT OT CH:[id number], can actively participate in 3 hours of therapy a day 5 days of the week, and can make measurable gains during the admission.  Patient will also benefit from the coordinated team approach during an Inpatient Acute Rehabilitation admission.  The patient will receive intensive therapy as well as Rehabilitation physician, nursing, social worker, and care management interventions.  Due to {due WC:4653188 the patient requires 24 hour a day rehabilitation nursing.  The patient is currently *** with mobility and basic ADLs.  Discharge setting and therapy post discharge at [facility] IP discharge location:[id number]} is anticipated.  Patient has agreed to participate  in the Acute Inpatient Rehabilitation Program and will admit {Time; today/tomorrow:10263}.  Preadmission Screen Completed By:  Cleatrice Burke, 09/27/2020 4:26 PM ______________________________________________________________________   Discussed status with Dr. Marland Kitchen on *** at *** and received approval for admission today.  Admission Coordinator:  Cleatrice Burke, RN, time Marland KitchenSudie Grumbling ***   Assessment/Plan: Diagnosis: 1. Does the need for close, 24 hr/day Medical supervision in concert with the patient's rehab needs make it unreasonable for this patient to be served in a less intensive setting? {yes_no_potentially:3041433} 2. Co-Morbidities requiring supervision/potential complications: *** 3. Due to {due AV:4098119}, does the patient require 24 hr/day rehab nursing? {yes_no_potentially:3041433} 4. Does the patient require coordinated care of a physician, rehab nurse, PT, OT, and SLP to address physical and functional deficits in the context of the above medical diagnosis(es)? {yes_no_potentially:3041433} Addressing deficits in the following areas:  {deficits:3041436} 5. Can the patient actively participate in an intensive therapy program of at least 3 hrs of therapy 5 days a week? {yes_no_potentially:3041433} 6. The potential for patient to make measurable gains while on inpatient rehab is {potential:3041437} 7. Anticipated functional outcomes upon discharge from inpatient rehab: {functional outcomes:304600100} PT, {functional outcomes:304600100} OT, {functional outcomes:304600100} SLP 8. Estimated rehab length of stay to reach the above functional goals is: *** 9. Anticipated discharge destination: {anticipated dc setting:21604} 10. Overall Rehab/Functional Prognosis: {potential:3041437}   MD Signature: ***

## 2020-09-27 NOTE — Progress Notes (Signed)
NAME:  Kathryn Garrett, MRN:  [id number], DOB:  1946/01/16, LOS: 33 ADMISSION DATE:  09/10/2020, CONSULTATION DATE: 09/11/2020 REFERRING MD: Dr. Damita Dunnings, CHIEF COMPLAINT: Shortness of breath  History of Present Illness:  75 year old female presented to the [facility] ED from home with complaints of shortness of breath.  Patient reported 2 to 3 days of progressive shortness of breath and associated new onset nonproductive cough as well as a subjective fever.  Per ED documentation she denied associated chest pain/palpitations/diaphoresis.  And denies any recent nausea/vomiting, chills/rigors/myalgias.  Patient confirmed a history of COPD and current smoking history stating she is not on any supplemental oxygen at baseline.  ED course: Patient received cefepime due to concerns for a left lower lobe infiltrate, 500 mL NS bolus & IV fluids at 150 mL an hour Initial vitals: Afebrile at 98, tachypneic at 22, tachycardic at 108, BP 127/72 & 99% on BiPAP Significant labs: Serum bicarb 15, hyperglycemic at 350, troponin 70 > 745 > 1838, BNP 222.6, VBG revealed metabolic acidosis: 6.28/31/517/61.6, leukocytosis at 40.2, lactic acidosis 2.5> 2.2 > 2.5  TRH hospitalist were consulted for admission.  Around midnight PCCM was called due to patient's deteriorating respiratory status.  Per ED provider Dr. Karma Greaser and care RN the patient complained that " I can't breathe", was given a dose of Ativan and became somnolent on BiPAP.  Coarse crackles auscultated bilaterally, and the decision was made to emergently intubate the patient and place her on mechanical ventilation. PCCM consulted for management  Significant Hospital Events: Including procedures, antibiotic start and stop dates in addition to other pertinent events   . 09/10/2020-patient admits admitted with hospitalist service for COPD exacerbation and left lower lobe pneumonia . 09/11/2020-overnight patient had suspected flash pulmonary edema, dyspnea followed by  somnolence requiring emergent intubation and mechanical ventilation.  Admitted to ICU . 09/12/20- patient failed SBT , she had blood tinged secretions fromETT and aggitation, family was at bedside and we agreed on giving her more time and try again.  . 09/14/20- Failed SBT (became hypoxic with sats mid 80's, increased WOB and assessory muscle use, HR 140-160's). Will diurese today and add Metoprolol.  Tracheal aspirate from 4/30 with KLEBSIELLA PNEUMONIAE (resistant to Ampicillin)   . 09/15/20- Failed SBT (increased WOB and assessory muscle use, RR 40's, HR 140-150's, Hypertensive); plan to add scheduled PO Klonopin and Oxycodone, Lasix 40 mg IV x1, consult Palliative Care . 09/16/20- Worsening Leukocytosis up to 21.7, low grade fever overnight, increased secretions overnight, will repeat Tracheal aspirate, remove central line, add mucinex, plan for SBT . 09/17/20-[name] passed SBT in AM and following commands still having some moderate secretions . 09/18/20-  Extubated yesterday.  Failed swallow eval today.  . 09/20/20- Weaned to room air, Leukocytosis improving; Hemodynamically stable . 09/27/20- patient is coughing up phlegm but clinically is improved, we discussed pneumonia and COPD with outpatient clinic follow up with pulmonology.  There is plan for post dc rehab  Interim History / Subjective:  No acute events reported overnight Afebrile, Hemodynamically stable, no Vasopressors Weaned to room air Urine output 900 cc over past 24 hrs (+1.3 L since admit), Creatinine remains stable with diuresis Leukocytosis continues to improve    Objective   Blood pressure (!) 115/55, pulse (!) 102, temperature 97.8 F (36.6 C), resp. rate 17, height 4\' 11"  (1.499 m), weight 57.6 kg, SpO2 90 %.        Intake/Output Summary (Last 24 hours) at 09/27/2020 0758 Last data filed at 09/26/2020 1909 Gross  per 24 hour  Intake 600 ml  Output 550 ml  Net 50 ml   Filed Weights   09/24/20 1240 09/25/20 0711 09/27/20 0500   Weight: 61.7 kg 57.2 kg 57.6 kg    Examination: General: Acute on chronically ill-appearing female, laying in bed, breathing treatment and chest PT via bed currently in place, in no acute distress HEENT: Atraumatic, normocephalic, neck supple, no JVD Neuro: Awake, alert and oriented x3, moves all extremities to command (generalized weakness), no focal deficits, speech clear CV: Tachycardia, regular rate and rhythm with occasional PACs, S1-S2, no murmurs, rubs, gallops, 2+ distal pulses Pulm: Coarse breath sounds bilaterally, no wheezing or rales noted, even, nonlabored GI: Soft, nontender, nondistended, no guarding or rebound tenderness, bowel sounds positive x4 Skin: Limited exam-warm and dry.  No obvious rashes, lesions, ulcerations Extremities: Warm and dry.  No deformities, minimal edema   Labs/imaging that I have personally reviewed  (right click and "Reselect all SmartList Selections" daily)  Labs 09/20/2020: WBC 20.1, K 3.0, glucose 124, BUN 68, Cr. 0.66 Chest x-ray 09/16/2020>>Endotracheal tube, NG tube, right IJ line in stable position. Heart size normal. Persistent but improved bilateral interstitial infiltrates. Tiny left pleural effusion cannot be excluded. No pneumothorax. MRI brain 09/13/2020:No acute brain finding. Mild chronic small-vessel ischemic change of the white matter, fairly typical for age.  Assessment & Plan:   Acute hypoxic respiratory failure secondary to Acute COPD Exacerbation & left lung multifocal pneumonia -S/p Extubation -Supplemental O2 as needed to maintain O2 sats 88 to 94% -Follow intermittent CXR & ABG as needed -Bronchodilators -Continue Budesonide nebs -Completed 7 days course of Cefepime/Ancef -Aggressive pulmonary toilet  Sepsis due to left lung multifocal pneumonia>> tracheal aspirate on 4/30 with KLEBSIELLA PNEUMONIAE (resistant to Ampicillin)>>treated -Monitor fever curve -Trend WBCs.  Elevated WBC in part related to steroids. -Follow  cultures as above -Completed 7 days of Cefepime/Ancef  Acute systolic congestive heart failure Acute non-ST elevation MI New diagnosis of A. fib with RVR -Continuous cardiac monitoring -Patient's ejection fraction on echocardiogram is 30% -Diuresis as renal function and BP permits >> currently on 40 mg IV Lasix daily and will continue -Cardiology following, input is appreciated -Milrinone has been tapered off -Patient completed therapy with IV heparin -Continue aspirin Plavix and statin -She will need left heart cath once stable -Metoprolol BID per tube  Acute metabolic/toxic encephalopathy>>improving -Improved now that she is extubated and off continuous sedation -stop scheduled Klonopin as unable to take p.o. -Was on buprenorphine/naloxone as an outpatient which is now held ~ continue scheduled Oxycodone per tube -Provide supportive care -CT Head at admission negative -MRI Brain 09/13/20 negative -Continue Aspirin, Plavix, Atorvastatin   Best practice (right click and "Reselect all SmartList Selections" daily)  Diet:  NPO.  Repeat swallow evaluation to be performed on 09/20/2020 Pain/Anxiety/Delirium protocol (if indicated): Oxycodone VAP protocol (if indicated): Now extubated. DVT prophylaxis: SCD (chemical prophylaxis held due to previous hemoptysis) GI prophylaxis: H2B Glucose control:  SSI Yes Central venous access: n/a Arterial line:  N/A Foley: N/A Mobility: Out of bed with assistance as tolerated PT consulted: needs Pt/OT Code Status:  full code Disposition: Stepdown   Labs   CBC: Recent Labs  Lab 09/23/20 0426 09/24/20 0430 09/25/20 0328 09/26/20 0404 09/27/20 0457  WBC 19.5* 18.7* 17.8* 17.8* 16.0*  NEUTROABS 13.1* 12.5* 11.4* 12.0* 11.1*  HGB 12.3 12.5 12.9 13.9 13.3  HCT 40.1 40.1 40.4 44.3 40.8  MCV 98.8 96.6 95.7 95.9 94.7  PLT 263 296 327 336 346  Basic Metabolic Panel: Recent Labs  Lab 09/21/20 0206 09/22/20 0418 09/23/20 0426  09/24/20 0430 09/25/20 0328 09/26/20 0328 09/27/20 0457  NA 147* 146* 140 138 137 135 136  K 3.7 4.4 3.9 3.6 3.7 3.8 3.5  CL 112* 112* 107 108 106 105 105  CO2 25 25 23  21* 23 20* 22  GLUCOSE 127* 98 98 102* 87 103* 117*  BUN 73* 57* 44* 36* 31* 32* 30*  CREATININE 0.65 0.56 0.51 0.43* 0.53 0.52 0.48  CALCIUM 9.4 9.5 8.7* 8.6* 9.0 8.8* 8.8*  MG 2.5* 2.5*  --   --   --   --   --   PHOS 3.6 3.5  --   --   --   --   --    GFR: Estimated Creatinine Clearance: 47 mL/min (by C-G formula based on SCr of 0.48 mg/dL). Recent Labs  Lab 09/24/20 0430 09/25/20 0328 09/26/20 0404 09/27/20 0457  WBC 18.7* 17.8* 17.8* 16.0*    Liver Function Tests: No results for input(s): AST, ALT, ALKPHOS, BILITOT, PROT, ALBUMIN in the last 168 hours. No results for input(s): LIPASE, AMYLASE in the last 168 hours. No results for input(s): AMMONIA in the last 168 hours.  ABG    Component Value Date/Time   PHART 7.38 09/15/2020 0500   PCO2ART 41 09/15/2020 0500   PO2ART 123 (H) 09/15/2020 0500   HCO3 33.5 (H) 09/17/2020 0944   TCO2 23 01/14/2009 0327   ACIDBASEDEF 0.8 09/15/2020 0500   O2SAT 88.8 09/17/2020 0944     Coagulation Profile: No results for input(s): INR, PROTIME in the last 168 hours.  Cardiac Enzymes: No results for input(s): CKTOTAL, CKMB, CKMBINDEX, TROPONINI in the last 168 hours.  HbA1C: Hgb A1c MFr Bld  Date/Time Value Ref Range Status  09/11/2020 04:37 AM 6.4 (H) 4.8 - 5.6 % Final    Comment:    (NOTE) Pre diabetes:          5.7%-6.4%  Diabetes:              >6.4%  Glycemic control for   <7.0% adults with diabetes   10/02/2019 12:28 PM 6.4 (H) 4.8 - 5.6 % Final    Comment:             Prediabetes: 5.7 - 6.4          Diabetes: >6.4          Glycemic control for adults with diabetes: <7.0     CBG: Recent Labs  Lab 09/26/20 1128 09/26/20 1655 09/26/20 1943 09/27/20 0016 09/27/20 0429  GLUCAP 109* 132* 86 120* Shattuck, M.D.   Pulmonary & [place]

## 2020-09-27 NOTE — Evaluation (Addendum)
Objective Swallowing Evaluation: Type of Study: MBS-Modified Barium Swallow Study   Patient Details  Name: Kathryn Garrett MRN: [id number] Date of Birth: May 15, 1946  Today's Date: 09/27/2020 Time: SLP Start Time (ACUTE ONLY): 1400 -SLP Stop Time (ACUTE ONLY): 1515  SLP Time Calculation (min) (ACUTE ONLY): 75 min   Past Medical History:  Past Medical History:  Diagnosis Date  . Anemia, mild   . Arthritis    "qwhere" (02/10/2016)  . CAD (coronary artery disease)    a. VF arrest 01/2009/CAD with inferoposterior MI s/p aspiration thrombectomy/BMS of RCA at that time. b. ISR of BMS s/p DES to RCA 06/2010 with moderate LM/LAD disease not significant by FFR. c. s/p angiosculpt PTCA to prox RCA 01/2016 for ISR. d. Aggressive PTCA to prox RCA for ISR 05/2016.  . Cardiac arrest ([place]) 01/2009   a. in setting of inf-post STEMI 01/2009 (VF).  . Chest pain 06/04/2016  . Chronic bronchitis ([place])   . Chronic combined systolic and diastolic CHF (congestive heart failure) ([place])    a. EF 40-45% by cath 2010. b. 60-65% with grade 1 DD by echo 09/2015  . COPD (chronic obstructive pulmonary disease) ([place])   . Hyperlipidemia 01/28/2016  . Hyperlipidemia LDL goal <70 01/28/2016  . Hypertension   . Lung mass   . MI (myocardial infarction) ([place]) 01/2009  . Narcotic abuse ([place])    pt now taking Suboxone tid  . Pleural effusion on left 09/24/2015  . Pneumonia 06/2014; 09/2015  . Pre-diabetes   . Pulmonary nodule   . PVD (peripheral vascular disease) ([facility])    mild atherosclerosis of infrarenal aorta, 25% ostial left renal artery stenosis, 50% ostial right common iliac by cath 2010  . S/P PTCA (percutaneous transluminal coronary angioplasty) 02/10/16 to RCA lesion for in stent restenois 02/11/2016  . Subclavian artery stenosis ([facility])    a. >50% by duplex 05/2016.  . Tobacco abuse   . Unstable angina [facility])    Past Surgical History:  Past Surgical History:  Procedure Laterality Date  . ABDOMINAL HYSTERECTOMY    .  ANKLE FRACTURE SURGERY Right 2000s  . CARDIAC CATHETERIZATION N/A 02/10/2016   Procedure: Left Heart Cath and Coronary Angiography;  Surgeon: Jettie Booze, MD;  Location: [place] CV LAB;  Service: Cardiovascular;  Laterality: N/A;  . CARDIAC CATHETERIZATION N/A 02/10/2016   Procedure: Coronary Balloon Angioplasty;  Surgeon: Jettie Booze, MD;  Location: [place] [facility] LAB;  Service: Cardiovascular;  Laterality: N/A;  instent RCA  . CARDIAC CATHETERIZATION N/A 06/05/2016   Procedure: Left Heart Cath and Coronary Angiography;  Surgeon: Leonie Man, MD;  Location: [place] [facility] LAB;  Service: Cardiovascular;  Laterality: N/A;  . CARDIAC CATHETERIZATION N/A 06/05/2016   Procedure: Coronary Balloon Angioplasty;  Surgeon: Leonie Man, MD;  Location: [place] [facility];  Service: Cardiovascular;  Laterality: N/A;  . CHEST TUBE INSERTION N/A 10/08/2015   Procedure: PLEURX CATH REMOVAL;  Surgeon: [name], MD;  Location: [facility] ORS;  Service: Thoracic;  Laterality: N/A;  . CORONARY ANGIOPLASTY WITH STENT PLACEMENT  01/2009; 2012  . FRACTURE SURGERY    . RIGHT/LEFT HEART CATH AND CORONARY ANGIOGRAPHY N/A 09/24/2020   Procedure: RIGHT/LEFT HEART CATH AND CORONARY ANGIOGRAPHY;  Surgeon: Nelva Bush, MD;  Location: [place] [facility] LAB;  Service: Cardiovascular;  Laterality: N/A;  . VIDEO ASSISTED THORACOSCOPY (VATS)/THOROCOTOMY Left 09/29/2015   Procedure: PREOP BRONCHOSCOPY, LEFT THORACOSCOPY, POSSIBLE THORACOTOMY, PLEURAL BIOPSY, TALC;  Surgeon: [name], MD;  Location: [facility] ORS;  Service: General;  Laterality: Left;   HPI: Pt is a 75 yo female admitted for COPD exacerbation and left lower lobe pneumonia. Tracheal aspirate from 4/30 with KLEBSIELLA PNEUMONIAE (resistant to Ampicillin). Head CT 09/17/2020 No acute intracranial abnormality.Past medical history including HRrEF, tobacco abuse, CAD s/p stenting, CHF, COPD who is admitted with COPD exacerbation / pneumonia /  decompensated heart failure / NSTEMI.  Pt intubated 09/11/2020 thru 09/17/2020. Extubated then w/ NG placed for meds, po's.   Subjective: pt pleasant, talkative, more alert w/ less distractibility    Assessment / Plan / Recommendation  CHL IP CLINICAL IMPRESSIONS 09/27/2020  Clinical Impression Pt appears to present w/ MOD+ pharyngeal phase dysphagia; grossly adequate oral phase of swallowing though pt is hampered by missing Bottom Dentition and overall generalized weakness. Also noted MOD Esophageal phase Dysmotility and bolus Stasis during this study w/ all trial consistencies. SILENT Aspiration and Laryngeal Penetration of trial consistencies of thin and Nectar liquids was noted during this study. MIN+ thickening of Cricopharyngeus Muscle w/ Both anterior and posterior prominence noted during the swallow -- this did not appear to impede bolus motility through the UES of the trials given at this exam; no residual residue just above UES was apparent. However, MOD Esophageal Dysmotility w/ bolus Stasis was noted in the Cervical Esophagus during lateral view. Time appeared to aid clearing of the bolus stasis. Suspect this could be related to pt's report of "years" of "acid reflux" for which she stated the took "TUMS".   Due to the SILENT Aspiration noted w/ liquid consistencies, pt is at increased risk for aspiration w/ liquids less than HONEY consistency which in turn increases risk for negative sequelae from Aspiration to occur including Aspiration Pneumonia.    During the Oral phase, timely bolus management and control of bolus  for A-P propulsion and transfer occurred. Oral clearing achieved w/ all trial consistencies w/ only slight diffuse oral residue noted intermittently w/ the various consistencies -- suspect could be related to missing Bottom Dentition. Pt was able to clear this residue w/ a f/u, Dry swallow. Missing Bottom Dentition also impacts ability to effectively masticate solids. She  adequately tolerated broken down, soft solids moistened in a puree w/ timely oral phase management, A-P transfer w/ oral clearing following.  During the Pharyngeal phase, delayed pharyngeal swallow initiation noted w/ thin and Nectar liquid consistencies. Reduced Epiglottic inversion and tight airway closure noted as well; min decreased BOT and pharyngeal pressure (umph) noted during the swallow. SILENT Aspiration and Laryngeal Penetration occurred before/druing the swallow despite use of swallowing strategies. W/ the use of swallowing strategies (Chin Tuck, f/u, Dry Swallow), and HONEY CONSISTENCY liquids, pt demonstrated NO immediate Aspiration or Laryngeal Penetration before/during the swallow. Min+ Valleculae residue appeared to reduce/clear w/ a f/u, Dry swallow - laryngeal Penetration noted x1 during f/u, Dry swallow to clear residue. Use of the Chin Tuck strategy during swallowing appeared beneficial w/ all consistencies of liquids and foods - encouraged pt to maintain a Chin Tuck position during f/u, Dry swallow as well. Pt was able to follow through w/ using swallowing strategies w/ Min Verbal Cues. During the Esophageal phase, MIN+ thickening of Cricopharyngeus Muscle w/ Both anterior and posterior prominence noted during the swallow -- this did not appear to impede bolus motility through the UES w/ the trials given. However, MOD Esophageal Dysmotility w/ bolus Stasis was noted in the Cervical Esophagus during lateral view. Time appeared to aid clearing of the bolus stasis.   Discussed results of  MBSS w/ Radiologist for any abnormalities to be addressed by MD immediately. With pt, discussed results of MBSS and impact of Pharyngeal phase Dysphagia and Aspiration(SILENT) and the Esophageal phase Dysmotility on safety of swallowing; also discussed the need to fully break down tougher solids/meats d/t Dentition status. Discussed need for HONEY consistency liquids; swallowing strategies of Chin Tuck and Dry  Swallow; food consistencies; moistening foods w/ Gravies. Discussed Pill swallowing strategies. Video viewed w/ pt, and questions answered. Recommended f/u w/ GI for ongoing management of Reflux/GERD; education. Recommended f/u w/ ENT d/t Dysphonia -- possible effects of long-term LPR vs recent oral intubation (direct view). Pt would benefit from continued skilled ST services to address pharyngeal phase Dysphagia w/ recommendation for repeat MBSS in next ~2-4 weeks as strength, medical status improve to determine appropriateness for advancement to thin liquids.  Primary Hospitalist/NSG/Family member in room updated after study.   SLP Visit Diagnosis Dysphagia, pharyngeal phase (R13.13);Dysphagia, pharyngoesophageal phase (R13.14)  Attention and concentration deficit following --  Frontal lobe and executive function deficit following --  Impact on safety and function Moderate aspiration risk;Risk for inadequate nutrition/hydration      CHL IP TREATMENT RECOMMENDATION 09/27/2020  Treatment Recommendations Therapy as outlined in treatment plan below     Prognosis 09/27/2020  Prognosis for Safe Diet Advancement Fair  Barriers to Reach Goals Time post onset;Severity of deficits;Behavior  Barriers/Prognosis Comment pt has not been drinking the thickened liquids which may impact hydration overall    CHL IP DIET RECOMMENDATION 09/27/2020  SLP Diet Recommendations Dysphagia 3 (Mech soft) solids;Honey thick liquids  Liquid Administration via Cup  Medication Administration Whole meds with puree  Compensations Minimize environmental distractions;Slow rate;Small sips/bites;Lingual sweep for clearance of pocketing;Multiple dry swallows after each bite/sip;Follow solids with liquid;Clear throat intermittently;Chin tuck  Postural Changes Remain semi-upright after after feeds/meals (Comment);Seated upright at 90 degrees      CHL IP OTHER RECOMMENDATIONS 09/27/2020  Recommended Consults Consider ENT  evaluation;Consider GI evaluation  Oral Care Recommendations Oral care BID;Oral care before and after PO;Patient independent with oral care  Other Recommendations Order thickener from pharmacy;Prohibited food (jello, ice cream, thin soups);Remove water pitcher;Have oral suction available      CHL IP FOLLOW UP RECOMMENDATIONS 09/27/2020  Follow up Recommendations Inpatient Rehab      CHL IP FREQUENCY AND DURATION 09/27/2020  Speech Therapy Frequency (ACUTE ONLY) min 2x/week  Treatment Duration 2 weeks           CHL IP ORAL PHASE 09/27/2020  Oral Phase WFL - grossly  Oral - Pudding Teaspoon --  Oral - Pudding Cup --  Oral - Honey Teaspoon 2  Oral - Honey Cup 2  Oral - Nectar Teaspoon 2  Oral - Nectar Cup 5  Oral - Nectar Straw --  Oral - Thin Teaspoon 4  Oral - Thin Cup --  Oral - Thin Straw --  Oral - Puree 3  Oral - Mech Soft 2  Oral - Regular --  Oral - Multi-Consistency --  Oral - Pill --  Oral Phase - Comment --    CHL IP PHARYNGEAL PHASE 09/27/2020  Pharyngeal Phase Impaired  Pharyngeal- Pudding Teaspoon --  Pharyngeal --  Pharyngeal- Pudding Cup --  Pharyngeal --  Pharyngeal- Honey Teaspoon 2  Pharyngeal --  Pharyngeal- Honey Cup 2  Pharyngeal --  Pharyngeal- Nectar Teaspoon 2  Pharyngeal --  Pharyngeal- Nectar Cup 5  Pharyngeal --  Pharyngeal- Nectar Straw --  Pharyngeal --  Pharyngeal- Thin Teaspoon 4  Pharyngeal --  Pharyngeal- Thin Cup --  Pharyngeal --  Pharyngeal- Thin Straw --  Pharyngeal --  Pharyngeal- Puree 3  Pharyngeal --  Pharyngeal- Mechanical Soft 2  Pharyngeal --  Pharyngeal- Regular --  Pharyngeal --  Pharyngeal- Multi-consistency --  Pharyngeal --  Pharyngeal- Pill --  Pharyngeal --  Pharyngeal Comment --     CHL IP CERVICAL ESOPHAGEAL PHASE 09/27/2020  Cervical Esophageal Phase Impaired  Pudding Teaspoon --  Pudding Cup --  Honey Teaspoon --  Honey Cup --  Nectar Teaspoon --  Nectar Cup --  Nectar Straw --  Thin  Teaspoon --  Thin Cup --  Thin Straw --  Puree --  Mechanical Soft --  Regular --  Multi-consistency --  Pill --  Cervical Esophageal Comment --         [name], MS, CCC-SLP Speech Language Pathologist Rehab Services [phone] [facility] 09/27/2020, 4:26 PM

## 2020-09-27 NOTE — Progress Notes (Signed)
Inpatient Rehabilitation Admissions Coordinator  I await [employer] Medicare Auth for CIR admit at [facility]. I began Friday and they typically take 72 business hrs.   Danne Baxter, RN, MSN Rehab Admissions Coordinator [phone] 09/27/2020 8:20 AM

## 2020-09-27 NOTE — Progress Notes (Signed)
Occupational Therapy Treatment Patient Details Name: Kathryn Garrett MRN: [id number] DOB: 06-Sep-1945 Today's Date: 09/27/2020    History of present illness 75 y.o. female with history of CAD with VF arrest s/p PCI to the RCA in 2017 s/p PTCA to the RCA for ISR in 2018, chronic combined systolic and diastolic CHF, COPD with ongoing tobacco use, HTN, HLD, anemia, and narcotic abuse on Suboxone therapy admitted with acute hypoxic respiratory failure requiring mechanical ventilation (extubated 09/17/2020) secondary to COPD exacerbation Klebsiella PNA and acute on chronic combined CHF with transient ST elevation and echo demonstrating new LV dysfunction.   OT comments  Pt seen for OT tx this date. Pt returned from [facility], family present. Pt endorses fatigue from long day and multiple providers but willing to participate and motivated to get better. Pt agreeable to grooming tasks EOB. CGA for sup>sit EOB with increased time/effort noted but no LOB. Pt tolerated sitting EOB for grooming tasks and demo'd fair unsupported static sitting balance throughout, HR up to 117 with bed mobility briefly, back down to 110's, pt requesting to stay up in recliner for meal, family present, bed alarm set. Tray table positioned. RN/NT aware. Pt continues to progress towards goals. Continue to recommend CIR for additional skilled high intensity OT services to maximize pt's return to PLOF and minimize risk of falls, functional decline, and increased caregiver burden.    Follow Up Recommendations  CIR    Equipment Recommendations  3 in 1 bedside commode;Tub/shower seat    Recommendations for Other Services      Precautions / Restrictions Precautions Precautions: Fall Restrictions Weight Bearing Restrictions: No       Mobility Bed Mobility Overal bed mobility: Needs Assistance Bed Mobility: Supine to Sit     Supine to sit: Min guard;HOB elevated Sit to supine: Min guard   General bed mobility comments: able to  get to EOB and back to bed on her own today with rail for assist    Transfers                      Balance Overall balance assessment: Needs assistance Sitting-balance support: No upper extremity supported;Feet unsupported Sitting balance-Leahy Scale: Fair                                     ADL either performed or assessed with clinical judgement   ADL Overall ADL's : Needs assistance/impaired     Grooming: Sitting;Wash/dry face;Oral care;Set up;Supervision/safety Grooming Details (indicate cue type and reason): Pt tolerated sitting EOB for grooming tasks with set up and supervision for safety                                     Vision       Perception     Praxis      Cognition Arousal/Alertness: Awake/alert Behavior During Therapy: [facility] for tasks assessed/performed Overall Cognitive Status: Within Functional Limits for tasks assessed                                 General Comments: alert and oriented, despite fatigue from multiple providers and MBSS earlier today, agreeable and motivated to participate        Exercises Other Exercises  Other Exercises: Seated grooming tasks,  care coordination for pt's family members   Shoulder Instructions       General Comments RN notified of pt's family's request to speak with someone regarding care/communication and current diet restrictions. OT confirmed diet with SLP and verified how to obtain honey thick liquids. OT removed nectar thick liquids (unopened) from room. Nurse techs in room at end of session inquiring about bath/washing hair. OT provided education on difference between nectar vs honey thick and how to obtain for the future. RN called Therapist, sports for family member to speak with.    Pertinent Vitals/ Pain       Pain Assessment: 0-10 Pain Score: 2  Pain Location: R/L groin and buttocks briefly during sup>sit transition, resolved once seated EOB; also endorsed  very brief 2/10 pain over L chest that resolved almost immediately Pain Descriptors / Indicators: Aching Pain Intervention(s): Limited activity within patient's tolerance;Monitored during session;Repositioned  Home Living     Available Help at Discharge:  (to stay with a daughter in [place] at d/c)     Technical brewer of Steps: flight Entrance Stairs-Rails: Right;Left       Bathroom Shower/Tub: Teacher, early years/pre: Standard Bathroom Accessibility: Yes How Accessible: Accessible via walker        Lives With: Alone    Prior Functioning/Environment              Frequency  Min 3X/week        Progress Toward Goals  OT Goals(current goals can now be found in the care plan section)  Progress towards OT goals: Progressing toward goals  Acute Rehab OT Goals Patient Stated Goal: get better so I can get home OT Goal Formulation: With patient/family Time For Goal Achievement: 10/03/20 Potential to Achieve Goals: Good  Plan Discharge plan remains appropriate;Frequency remains appropriate    Co-evaluation                 AM-PAC OT "6 Clicks" Daily Activity     Outcome Measure   Help from another person eating meals?: A Little Help from another person taking care of personal grooming?: A Little Help from another person toileting, which includes using toliet, bedpan, or urinal?: A Lot Help from another person bathing (including washing, rinsing, drying)?: A Lot Help from another person to put on and taking off regular upper body clothing?: A Little Help from another person to put on and taking off regular lower body clothing?: A Lot 6 Click Score: 15    End of Session    OT Visit Diagnosis: Unsteadiness on feet (R26.81);Muscle weakness (generalized) (M62.81);Other symptoms and signs involving cognitive function   Activity Tolerance Patient tolerated treatment well   Patient Left in bed;with family/visitor present;with nursing/sitter  in room;Other (comment) (seated EOB with family and nurse techs present)   Nurse Communication Other (comment) (family wishing to speak with unit director regarding care/communication; education on nectar vs honey thick liquids and how to order honey thick)        Time: 1640-1711 OT Time Calculation (min): 31 min  Charges: OT General Charges $OT Visit: 1 Visit OT Treatments $Self Care/Home Management : 8-22 mins  Hanley Hays, MPH, MS, OTR/L [employer] [phone] 09/27/20, 5:34 PM

## 2020-09-27 NOTE — Progress Notes (Signed)
Physical Therapy Treatment Patient Details Name: Kathryn Garrett MRN: [id number] DOB: 11-03-1945 Today's Date: 09/27/2020    History of Present Illness 75 y.o. female with history of CAD with VF arrest s/p PCI to the RCA in 2017 s/p PTCA to the RCA for ISR in 2018, chronic combined systolic and diastolic CHF, COPD with ongoing tobacco use, HTN, HLD, anemia, and narcotic abuse on Suboxone therapy admitted with acute hypoxic respiratory failure requiring mechanical ventilation (extubated 09/17/2020) secondary to COPD exacerbation Klebsiella PNA and acute on chronic combined CHF with transient ST elevation and echo demonstrating new LV dysfunction.    PT Comments    Pt just returned to room from swallow eval and finishing with SLP upon arrival.  She reported general fatigue from testing and transfers on off equipment but agrees to exercises.  She is able to tolerate supine manual resistive exercises 2 x 10 for all supine ranges and bridging.  She is able to transition to/from EOB with min guard today and increased time with use of rail which is improved from yesterday.  Steady in sitting x 10 minutes.  She declines OOB/gait at this time due to fatigue but tolerates sitting well.  She is visually fatigued but puts good effort into lateral scooting in sitting towards HOB prior to sitting.  She is encouraged to get OOB for dinner with nursing and voices understanding.  HR does increase to 128 with seated activity today.  CIR remains appropriate.  She is motivated to increase overall independence and puts good effort into therapy sessions.     Follow Up Recommendations  CIR     Equipment Recommendations  None recommended by PT    Recommendations for Other Services       Precautions / Restrictions Precautions Precautions: Fall Restrictions Weight Bearing Restrictions: No    Mobility  Bed Mobility Overal bed mobility: Needs Assistance Bed Mobility: Supine to Sit;Sit to Supine     Supine  to sit: Min guard Sit to supine: Min guard   General bed mobility comments: able to get to EOB and back to bed on her own today with rail for assist    Transfers                    Ambulation/Gait                 Stairs             Wheelchair Mobility    Modified Rankin (Stroke Patients Only)       Balance Overall balance assessment: Needs assistance Sitting-balance support: Feet supported Sitting balance-Leahy Scale: Good                                      Cognition Arousal/Alertness: Awake/alert Behavior During Therapy: WFL for tasks assessed/performed Overall Cognitive Status: Within Functional Limits for tasks assessed                                        Exercises Other Exercises Other Exercises: 2 x 10 manual resistance exercies for BLE and bridging    General Comments        Pertinent Vitals/Pain Pain Assessment: No/denies pain    Home Living  Prior Function            PT Goals (current goals can now be found in the care plan section) Progress towards PT goals: Progressing toward goals    Frequency    7X/week      PT Plan Current plan remains appropriate    Co-evaluation              AM-PAC PT "6 Clicks" Mobility   Outcome Measure  Help needed turning from your back to your side while in a flat bed without using bedrails?: A Little Help needed moving from lying on your back to sitting on the side of a flat bed without using bedrails?: A Little Help needed moving to and from a bed to a chair (including a wheelchair)?: A Lot Help needed standing up from a chair using your arms (e.g., wheelchair or bedside chair)?: A Lot Help needed to walk in hospital room?: A Lot Help needed climbing 3-5 steps with a railing? : Total 6 Click Score: 13    End of Session Equipment Utilized During Treatment: Gait belt Activity Tolerance: Patient limited by  fatigue Patient left: in bed;with call bell/phone within reach;with bed alarm set;with family/visitor present Nurse Communication: Mobility status PT Visit Diagnosis: Difficulty in walking, not elsewhere classified (R26.2);Muscle weakness (generalized) (M62.81);Unsteadiness on feet (R26.81)     Time: 2703-5009 PT Time Calculation (min) (ACUTE ONLY): 29 min  Charges:  $Therapeutic Exercise: 23-37 mins                    Chesley Noon, PTA 09/27/20, 4:00 PM

## 2020-09-27 NOTE — Care Management Important Message (Signed)
Important Message  Patient Details  Name: Kathryn Garrett MRN: [id number] Date of Birth: [birth year]   Medicare Important Message Given:  Yes     Dannette Barbara 09/27/2020, 10:57 AM

## 2020-09-27 NOTE — Progress Notes (Signed)
OT Cancellation Note  Patient Details Name: Kathryn Garrett MRN: [id number] DOB: March 26, 1946   Cancelled Treatment:    Reason Eval/Treat Not Completed: Patient at procedure or test/ unavailable. Per RN, pt off the floor for modified barium swallow test. Plan to follow up at later time/date as able.  Fredirick Maudlin, OTR/L [place]

## 2020-09-27 NOTE — Progress Notes (Signed)
PROGRESS NOTE    Kathryn Garrett  XKG:[id number] DOB: 09-09-1945 DOA: 09/10/2020 PCP: Imagene Riches, NP  Brief Narrative:  74/F presented to the [facility] ED from home with complaints of shortness of breath -Admitted on 4/29 with hypoxic respiratory failure secondary to COPD exacerbation and community-acquired pneumonia -Hospital course was complicated by flash pulmonary edema and NSTEMI requiring mechanical ventilation, intubated on 4/30 -Treated with antibiotics, steroids and diuretics -Eventually extubated on 5/7 [facility] course further complicated by encephalopathy -Cardiology following, underwent left heart cath which noted nonobstructive CAD, medical management recommended -PT eval completed, plan for discharge to CIR, awaiting insurance Auth   Assessment & Plan:    Acute hypoxic respiratory failure  Acute COPD Exacerbation/left lung multifocal pneumonia Flash pulmonary edema on 4/30 -Intubated for flash pulmonary edema on, extubated 5/7 -Clinically improving, completed antibiotic course -Treated with diuretics as well -Improved and stable, weaned off O2 -Discharge planning, plan for CIR pending insurance authorization  Sepsis, left lower lobe pneumonia -tracheal aspirate on 4/30 with KLEBSIELLA PNEUMONIAE (resistant to Ampicillin) -Completed 7 days of antibiotics. -Improving -SLP following, dysphagia slowly improving, remains on dysphagia 3 nectar thick liquids, SLP follow-up at CIR  Acute systolic congestive heart failure/Flash pulmonary edema NSTEMI Paroxysmal SVT -Echo noted with drop in EF down to 30%  -Previously on inotropes in ICU, weaned off, also treated with IV heparin briefly  -Cardiology following, continue aspirin, Plavix, metoprolol, Entresto -Clinically appears euvolemic -Left heart cath with nonobstructive CAD, recommended medical management, plan for repeat echo in 2 to 3 months  Acute metabolic/toxic encephalopathy -ICU stay complicated by  severe encephalopathy while on vent -At baseline on buprenorphine/naloxone which was held on admission, now resumed -Also had an MRI brain which was unremarkable -Improved and stable now, PT OT as tolerated -CIR recommended for rehab  COPD Ongoing tobacco abuse -Counseled, taper off prednisone, continue duo nebs  DVT prophylaxis: Lovenox Code Status: Full Family Communication: No family at bedside Disposition Plan: Status is: Inpatient  Remains inpatient appropriate because:Inpatient level of care appropriate due to severity of illness   Dispo: The patient is from: Home              Anticipated d/c is to: CIR pending insurance authorization              Patient currently is medically stable to d/c.   Difficult to place patient No Consultants:   Cardiology   Procedures: Endotracheal intubation Left heart catheterization PROCEDURE:  Procedure(s): RIGHT/LEFT HEART CATH AND CORONARY ANGIOGRAPHY (N/A)  SURGEON:  Surgeon(s) and Role:    * End, Harrell Gave, MD - Primary  FINDINGS: 1. Nonobstructive CAD. 2. Normal left and right heart filling pressures. 3. Normal cardiac output/index.  RECOMMENDATIONS: 1. Escalate GDMT at tolerated. 2. Secondary prevention of coronary artery disease    Antimicrobials:  None   Subjective: -Feels okay overall, intermittent cough, back on thickened liquids  Objective: Vitals:   09/27/20 0500 09/27/20 0740 09/27/20 0802 09/27/20 1151  BP:   133/75 (!) 138/107  Pulse:   (!) 110 (!) 106  Resp:   20 17  Temp:   98.2 F (36.8 C) 98.7 F (37.1 C)  TempSrc:   Oral   SpO2:  93% 93% 93%  Weight: 57.6 kg     Height:        Intake/Output Summary (Last 24 hours) at 09/27/2020 1337 Last data filed at 09/26/2020 1909 Gross per 24 hour  Intake 120 ml  Output 300 ml  Net -180 ml  Filed Weights   09/24/20 1240 09/25/20 0711 09/27/20 0500  Weight: 61.7 kg 57.2 kg 57.6 kg    Examination:  General exam: Pleasant chronically ill  female sitting up in bed, AAOx3, no distress CVS: S1-S2, regular rate rhythm Lungs: Few basilar rhonchi, otherwise clear Abdomen: Soft, nontender, bowel sounds present Extremities: No edema  Skin: No rashes on exposed skin  Psychiatry: Appropriate mood and affect   Data Reviewed: I have personally reviewed following labs and imaging studies  CBC: Recent Labs  Lab 09/23/20 0426 09/24/20 0430 09/25/20 0328 09/26/20 0404 09/27/20 0457  WBC 19.5* 18.7* 17.8* 17.8* 16.0*  NEUTROABS 13.1* 12.5* 11.4* 12.0* 11.1*  HGB 12.3 12.5 12.9 13.9 13.3  HCT 40.1 40.1 40.4 44.3 40.8  MCV 98.8 96.6 95.7 95.9 94.7  PLT 263 296 327 336 093   Basic Metabolic Panel: Recent Labs  Lab 09/21/20 0206 09/22/20 0418 09/23/20 0426 09/24/20 0430 09/25/20 0328 09/26/20 0328 09/27/20 0457  NA 147* 146* 140 138 137 135 136  K 3.7 4.4 3.9 3.6 3.7 3.8 3.5  CL 112* 112* 107 108 106 105 105  CO2 25 25 23  21* 23 20* 22  GLUCOSE 127* 98 98 102* 87 103* 117*  BUN 73* 57* 44* 36* 31* 32* 30*  CREATININE 0.65 0.56 0.51 0.43* 0.53 0.52 0.48  CALCIUM 9.4 9.5 8.7* 8.6* 9.0 8.8* 8.8*  MG 2.5* 2.5*  --   --   --   --   --   PHOS 3.6 3.5  --   --   --   --   --    GFR: Estimated Creatinine Clearance: 47 mL/min (by C-G formula based on SCr of 0.48 mg/dL). Liver Function Tests: No results for input(s): AST, ALT, ALKPHOS, BILITOT, PROT, ALBUMIN in the last 168 hours. No results for input(s): LIPASE, AMYLASE in the last 168 hours. No results for input(s): AMMONIA in the last 168 hours. Coagulation Profile: No results for input(s): INR, PROTIME in the last 168 hours. Cardiac Enzymes: No results for input(s): CKTOTAL, CKMB, CKMBINDEX, TROPONINI in the last 168 hours. BNP (last 3 results) No results for input(s): PROBNP in the last 8760 hours. HbA1C: No results for input(s): HGBA1C in the last 72 hours. CBG: Recent Labs  Lab 09/26/20 1943 09/27/20 0016 09/27/20 0429 09/27/20 0804 09/27/20 1154  GLUCAP  86 120* 135* 120* 141*   Lipid Profile: No results for input(s): CHOL, HDL, LDLCALC, TRIG, CHOLHDL, LDLDIRECT in the last 72 hours. Thyroid Function Tests: No results for input(s): TSH, T4TOTAL, FREET4, T3FREE, THYROIDAB in the last 72 hours. Anemia Panel: No results for input(s): VITAMINB12, FOLATE, FERRITIN, TIBC, IRON, RETICCTPCT in the last 72 hours. Sepsis Labs: No results for input(s): PROCALCITON, LATICACIDVEN in the last 168 hours.  No results found for this or any previous visit (from the past 240 hour(s)).   Scheduled Meds: . aspirin  81 mg Oral QHS  . budesonide (PULMICORT) nebulizer solution  0.25 mg Nebulization BID  . buprenorphine-naloxone  2 tablet Sublingual Daily  . Chlorhexidine Gluconate Cloth  6 each Topical QHS  . clopidogrel  75 mg Oral QHS  . enoxaparin (LOVENOX) injection  40 mg Subcutaneous Q24H  . feeding supplement (NEPRO CARB STEADY)  237 mL Oral TID BM  . fluticasone  1 spray Each Nare Daily  . insulin aspart  0-15 Units Subcutaneous Q4H  . levothyroxine  88 mcg Oral Q0600  . loratadine  10 mg Oral Daily  . mouth rinse  15 mL Mouth  Rinse BID  . metoprolol succinate  50 mg Oral Daily  . multivitamin with minerals  1 tablet Oral Daily  . pantoprazole  40 mg Oral Daily  . rosuvastatin  40 mg Oral QHS  . sacubitril-valsartan  1 tablet Oral BID  . sodium chloride flush  3 mL Intravenous Q12H  . sodium chloride flush  3 mL Intravenous Q12H   Continuous Infusions: . sodium chloride Stopped (09/24/20 1321)    LOS: 17 days   Time spent: 25 minutes  Domenic Polite, MD [facility]  09/27/2020, 1:37 PM

## 2020-09-28 DIAGNOSIS — Z7189 Other specified counseling: Secondary | ICD-10-CM | POA: Diagnosis not present

## 2020-09-28 DIAGNOSIS — J441 Chronic obstructive pulmonary disease with (acute) exacerbation: Secondary | ICD-10-CM | POA: Diagnosis not present

## 2020-09-28 DIAGNOSIS — Z515 Encounter for palliative care: Secondary | ICD-10-CM | POA: Diagnosis not present

## 2020-09-28 LAB — CBC WITH DIFFERENTIAL/PLATELET
Abs Immature Granulocytes: 0.03 10*3/uL (ref 0.00–0.07)
Basophils Absolute: 0.1 10*3/uL (ref 0.0–0.1)
Basophils Relative: 1 %
Eosinophils Absolute: 0.2 10*3/uL (ref 0.0–0.5)
Eosinophils Relative: 2 %
HCT: 38.3 % (ref 36.0–46.0)
Hemoglobin: 11.9 g/dL — ABNORMAL LOW (ref 12.0–15.0)
Immature Granulocytes: 0 %
Lymphocytes Relative: 37 %
Lymphs Abs: 4 10*3/uL (ref 0.7–4.0)
MCH: 29.8 pg (ref 26.0–34.0)
MCHC: 31.1 g/dL (ref 30.0–36.0)
MCV: 95.8 fL (ref 80.0–100.0)
Monocytes Absolute: 0.8 10*3/uL (ref 0.1–1.0)
Monocytes Relative: 7 %
Neutro Abs: 5.8 10*3/uL (ref 1.7–7.7)
Neutrophils Relative %: 53 %
Platelets: 339 10*3/uL (ref 150–400)
RBC: 4 MIL/uL (ref 3.87–5.11)
RDW: 14.7 % (ref 11.5–15.5)
WBC: 10.8 10*3/uL — ABNORMAL HIGH (ref 4.0–10.5)
nRBC: 0 % (ref 0.0–0.2)

## 2020-09-28 LAB — GLUCOSE, CAPILLARY
Glucose-Capillary: 115 mg/dL — ABNORMAL HIGH (ref 70–99)
Glucose-Capillary: 117 mg/dL — ABNORMAL HIGH (ref 70–99)
Glucose-Capillary: 117 mg/dL — ABNORMAL HIGH (ref 70–99)
Glucose-Capillary: 117 mg/dL — ABNORMAL HIGH (ref 70–99)
Glucose-Capillary: 123 mg/dL — ABNORMAL HIGH (ref 70–99)
Glucose-Capillary: 130 mg/dL — ABNORMAL HIGH (ref 70–99)
Glucose-Capillary: 133 mg/dL — ABNORMAL HIGH (ref 70–99)

## 2020-09-28 MED ORDER — METOPROLOL SUCCINATE ER 50 MG PO TB24: 50.0000 mg | ORAL_TABLET | Freq: Every day | ORAL | Status: DC

## 2020-09-28 MED ORDER — PANTOPRAZOLE SODIUM 40 MG PO TBEC: 40.0000 mg | DELAYED_RELEASE_TABLET | Freq: Every day | ORAL | Status: DC

## 2020-09-28 MED ORDER — SACUBITRIL-VALSARTAN 24-26 MG PO TABS: 1.0000 | ORAL_TABLET | Freq: Two times a day (BID) | ORAL | 0 refills | Status: DC

## 2020-09-28 NOTE — Discharge Summary (Addendum)
Physician Discharge Summary  CLAY ALVIAR [id number] DOB: 08/21/1945 DOA: 09/10/2020  PCP: Imagene Riches, NP  Admit date: 09/10/2020 Discharge date: 09/28/2020  Time spent: 35 minutes  Recommendations for Outpatient Follow-up:  1. SNF for rehab, needs close SLP follow-up for dysphagia 2. Cardiology Dr. Irish Lack in 2 to 3 weeks, needs repeat echo   Discharge Diagnoses:  Principal Problem: Acute hypoxic respiratory failure COPD exacerbation Left lung pneumonia, Klebsiella pneumonia Flash pulmonary edema on 4/30 Non-STEMI Acute systolic CHF Dysphagia  HTN (hypertension)   Tobacco abuse   Hyperlipidemia LDL goal <70   Severe sepsis (HCC)   NSTEMI (non-ST elevated myocardial infarction) (HCC)   Hyperglycemia   SOB (shortness of breath)   Acute respiratory failure (HCC)   HFrEF (heart failure with reduced ejection fraction) ([place])   Acute on chronic HFrEF (heart failure with reduced ejection fraction) ([place])   Discharge Condition: Stable Diet recommendation:  Dysphagia 3, honey thick liquids, low-sodium diet   Filed Weights   09/25/20 0711 09/27/20 0500 09/28/20 0428  Weight: 57.2 kg 57.6 kg 57.4 kg    History of present illness:  74/F presented to the [facility] ED from home with complaints of shortness of breath -Admitted on 4/29 with hypoxic respiratory failure secondary to COPD exacerbation and community-acquired pneumonia -Hospital course was complicated by flash pulmonary edema and NSTEMI requiring mechanical ventilation, intubated on 4/30  Hospital Course:   Acute hypoxic respiratory failure  Acute COPD Exacerbation/left lung multifocal pneumonia Flash pulmonary edema on 4/30 -Intubated for flash pulmonary edema on, extubated 5/7 -Clinically improving, completed antibiotic course -Treated with diuretics as well, now off -Improved and stable, weaned off O2 -Discharge planning, plan for CIR for rehabilitation  Sepsis, left lower lobe  pneumonia Dysphagia -tracheal aspirate on 4/30 with KLEBSIELLA PNEUMONIAE (resistant to Ampicillin) -Completed 7 days of antibiotics. -Improving -SLP following,  continues to have dysphagia, currently on dysphagia 3, honey thick liquids -Will need close SLP FU at rehab to monitor for continued improvement and changes to diet  Acute systolic congestive heart failure/Flash pulmonary edema NSTEMI Paroxysmal SVT -Echo noted with drop in EF down to 30%  -Briefly required inotropes in the ICU, this was then weaned off -Cardiology consulted, left heart cath noted nonobstructive coronary disease, recommended medical management and repeat echo in 2 to 3 months -Continue aspirin, Plavix, metoprolol, Entresto -Clinically euvolemic at this time -Follow-up with Dr. Irish Lack in 2 to 3 weeks  Acute metabolic/toxic encephalopathy -ICU stay complicated by severe encephalopathy while on vent -At baseline on buprenorphine/naloxone which was held on admission, now resumed -Also had an MRI brain which was unremarkable -Improved and stable now, PT OT as tolerated -CIR recommended for rehab  COPD Ongoing tobacco abuse -Counseled, tapered off prednisone, continue duo nebs  Discharge Exam: Vitals:   09/28/20 0753 09/28/20 0756  BP:  90/61  Pulse:  91  Resp:  17  Temp:  98 F (36.7 C)  SpO2: 96% 96%    General: AAOx3 Cardiovascular:S1S2/RRR Respiratory: Improved air movement, decreased breath sounds at the bases  Discharge Instructions    Allergies as of 09/28/2020      Reactions   Azithromycin Rash      Medication List    STOP taking these medications   amoxicillin 500 MG tablet Commonly known as: AMOXIL   dexamethasone 6 MG tablet Commonly known as: DECADRON   doxycycline 100 MG capsule Commonly known as: VIBRAMYCIN   furosemide 40 MG tablet Commonly known as: LASIX   ibuprofen 600 MG  tablet Commonly known as: ADVIL   isosorbide mononitrate 30 MG 24 hr  tablet Commonly known as: IMDUR   metoprolol tartrate 25 MG tablet Commonly known as: LOPRESSOR   mupirocin ointment 2 % Commonly known as: BACTROBAN   nitroGLYCERIN 0.4 MG SL tablet Commonly known as: NITROSTAT   nystatin cream Commonly known as: MYCOSTATIN   potassium chloride SA 20 MEQ tablet Commonly known as: KLOR-CON   predniSONE 20 MG tablet Commonly known as: DELTASONE     TAKE these medications   acetaminophen 500 MG tablet Commonly known as: TYLENOL Take 1,000 mg by mouth every 6 (six) hours as needed for headache.   albuterol 108 (90 Base) MCG/ACT inhaler Commonly known as: VENTOLIN HFA Inhale 2 puffs into the lungs every 4 (four) hours as needed for wheezing or shortness of breath.   aspirin EC 81 MG tablet Take 81 mg by mouth at bedtime.   azelastine 0.1 % nasal spray Commonly known as: ASTELIN USE 2 SPRAYS BID UNTIL DIRECTED TO STOP. USE FOR RUNNY NOSE   Buprenorphine HCl-Naloxone HCl 8-2 MG Film Place 0.5 Film under the tongue daily.   cetirizine 10 MG tablet Commonly known as: ZYRTEC Take 1 tablet (10 mg total) by mouth daily.   clopidogrel 75 MG tablet Commonly known as: PLAVIX TAKE 1 TABLET(75 MG) BY MOUTH AT BEDTIME   fluticasone 50 MCG/ACT nasal spray Commonly known as: FLONASE Place 1 spray into both nostrils daily.   levothyroxine 88 MCG tablet Commonly known as: SYNTHROID Take 88 mcg by mouth every morning.   metoprolol succinate 50 MG 24 hr tablet Commonly known as: TOPROL-XL Take 1 tablet (50 mg total) by mouth daily. Take with or immediately following a meal. Start taking on: Sep 29, 2020   pantoprazole 40 MG tablet Commonly known as: PROTONIX Take 1 tablet (40 mg total) by mouth daily. Start taking on: Sep 29, 2020   rosuvastatin 20 MG tablet Commonly known as: CRESTOR TAKE 1 TABLET(20 MG) BY MOUTH DAILY   sacubitril-valsartan 24-26 MG Commonly known as: ENTRESTO Take 1 tablet by mouth 2 (two) times daily.   Trelegy  Ellipta 100-62.5-25 MCG/INH Aepb Generic drug: Fluticasone-Umeclidin-Vilant Take 1 puff by mouth daily.   triamcinolone cream 0.1 % Commonly known as: KENALOG Apply topically 2 (two) times daily. to affected area      Allergies  Allergen Reactions  . Azithromycin Rash      The results of significant diagnostics from this hospitalization (including imaging, microbiology, ancillary and laboratory) are listed below for reference.    Significant Diagnostic Studies: DG Abd 1 View  Result Date: 09/21/2020 CLINICAL DATA:  Pain EXAM: ABDOMEN - 1 VIEW COMPARISON:  Sep 19, 2020. FINDINGS: Feeding tube no longer present. There is moderate stool in the colon. There is no bowel dilatation or air-fluid level to suggest bowel obstruction. No evident free air. IMPRESSION: Moderate stool in colon. No evident bowel obstruction or free air on supine examination. Electronically Signed   By: Lowella Grip III M.D.   On: 09/21/2020 09:57   DG Abd 1 View  Result Date: 09/19/2020 CLINICAL DATA:  Nasogastric tube placement. EXAM: ABDOMEN - 1 VIEW COMPARISON:  One view chest 09/17/2020 FINDINGS: A feeding tube projects over the right upper quadrant of the abdomen consistent with tip in the distal stomach or proximal duodenum. The previously demonstrated nasogastric tube has been removed. The visualized bowel gas pattern is normal. Mild lumbar spine degenerative changes are present. IMPRESSION: Feeding tube projects to the right  upper quadrant of the abdomen consistent with position in the distal stomach or proximal duodenum. Electronically Signed   By: Richardean Sale M.D.   On: 09/19/2020 12:12   CT HEAD WO CONTRAST  Result Date: 09/17/2020 CLINICAL DATA:  Acute neuro deficit, stroke suspected, acute COPD exacerbation EXAM: CT HEAD WITHOUT CONTRAST TECHNIQUE: Contiguous axial images were obtained from the base of the skull through the vertex without intravenous contrast. COMPARISON:  09/11/2020 CT, MRI  09/13/2020 FINDINGS: Brain: Stable benign mineralization in the bilateral basal ganglia. No evidence of acute infarction, hemorrhage, hydrocephalus, extra-axial collection, visible mass lesion or mass effect. Symmetric prominence of the ventricles, cisterns and sulci compatible with parenchymal volume loss. Patchy areas of white matter hypoattenuation are most compatible with chronic microvascular angiopathy. Vascular: Atherosclerotic calcification of the carotid siphons. No hyperdense vessel. Skull: No calvarial fracture or suspicious osseous lesion. No scalp swelling or hematoma. Sinuses/Orbits: Layering air-fluid level noted in the left maxillary sinus with some mild thickening in the ethmoids. Mastoid air cells and middle ear cavities are clear. Prior bilateral lens extractions. Stable mineralization along the left lateral rectus insertion. Orbits otherwise unremarkable. Other: None. IMPRESSION: No acute intracranial abnormality. If there is persisting concern for acute infarction, MRI is more sensitive and specific for early features of ischemia. Air-fluid level left maxillary sinus, possibly related to instrumentation versus sinusitis. Electronically Signed   By: Lovena Le M.D.   On: 09/17/2020 04:53   CT HEAD WO CONTRAST  Result Date: 09/11/2020 CLINICAL DATA:  75 year old female with unexplained altered mental status. Intubated and unresponsive. EXAM: CT HEAD WITHOUT CONTRAST TECHNIQUE: Contiguous axial images were obtained from the base of the skull through the vertex without intravenous contrast. COMPARISON:  None. FINDINGS: Brain: No midline shift, ventriculomegaly, mass effect, evidence of mass lesion, intracranial hemorrhage or evidence of cortically based acute infarction. Gray-white matter differentiation appears symmetric and within normal limits for age. No cortical encephalomalacia identified. Mild dystrophic calcification in the basal ganglia. Vascular: Calcified atherosclerosis at the  skull base. No suspicious intracranial vascular hyperdensity. Skull: Negative. Sinuses/Orbits: Frontoethmoidal sinus osteomas appear inconsequential. Visualized paranasal sinuses and mastoids are clear. Other: No acute orbit or scalp soft tissue finding. Chronic postoperative changes to the globes. IMPRESSION: Negative for age noncontrast CT appearance of the brain. No acute intracranial abnormality identified. Electronically Signed   By: Genevie Ann M.D.   On: 09/11/2020 09:20   CT ANGIO CHEST PE W OR WO CONTRAST  Result Date: 09/11/2020 CLINICAL DATA:  75 year old female with unexplained altered mental status. Intubated and unresponsive. EXAM: CT ANGIOGRAPHY CHEST WITH CONTRAST TECHNIQUE: Multidetector CT imaging of the chest was performed using the standard protocol during bolus administration of intravenous contrast. Multiplanar CT image reconstructions and MIPs were obtained to evaluate the vascular anatomy. CONTRAST:  32mL OMNIPAQUE IOHEXOL 350 MG/ML SOLN COMPARISON:  Portable chest 0706 hours today.  Chest CTA 09/24/2015. FINDINGS: Cardiovascular: Good contrast bolus timing in the pulmonary arterial tree. Mild respiratory motion. There is no central or hilar pulmonary embolus. The segmental branches also appear to be patent. There is no convincing subsegmental filling defect. Calcified coronary artery and Calcified aortic atherosclerosis. No cardiomegaly or pericardial effusion. Mediastinum/Nodes: No mediastinal lymphadenopathy. Enteric tube courses through the esophagus and into the stomach. Lungs/Pleura: Confluent reticulonodular opacity throughout the left upper lobe and lingula. Less pronounced similar opacity in the left lower lobe sparing the superior segment. Similar opacity in the medial basal segment of the right lower lobe. Some superimposed dependent atelectasis as well which  is enhancing. There is trace pleural fluid in the left lung. Endotracheal tube tip in good position above the carina.  Atelectatic changes to the mainstem bronchi. Underlying centrilobular emphysema. Mildly lobulated right upper lobe 7-8 mm pulmonary nodule is stable since 2017, and stable by report since 2014 at that time-benign. Upper Abdomen: Benign left adrenal myelolipoma. Mild reflux of IV contrast into the hepatic veins. Otherwise negative visible liver, gallbladder, spleen, pancreas, right adrenal gland and bowel. Enteric tube terminates in the stomach. Musculoskeletal: Advanced degenerative changes in the mid and lower thoracic spine. No acute osseous abnormality identified. Review of the MIP images confirms the above findings. IMPRESSION: 1. Negative for PE. Mild respiratory motion degrades some distal pulmonary branch detail but there is no convincing pulmonary embolus. 2. Multilobar Pneumonia maximal in the left upper lobe. Early right lower lobe involvement. Trace left pleural effusion. 3. Stable and benign right upper lobe pulmonary nodule. Underlying Aortic Atherosclerosis (ICD10-I70.0) and Emphysema (ICD10-J43.9). Calcified coronary artery atherosclerosis. Electronically Signed   By: Odessa Fleming M.D.   On: 09/11/2020 09:35   MR BRAIN WO CONTRAST  Result Date: 09/13/2020 CLINICAL DATA:  Altered mental status.  Stroke presentation. EXAM: MRI HEAD WITHOUT CONTRAST TECHNIQUE: Multiplanar, multiecho pulse sequences of the brain and surrounding structures were obtained without intravenous contrast. COMPARISON:  Head CT 09/11/2020 FINDINGS: Brain: Diffusion imaging does not show any acute or subacute infarction. The brain shows generalized age related atrophy. No focal abnormality affects brainstem or cerebellum. Cerebral hemispheres show mild changes of chronic small vessel disease, fairly typical for age. No cortical or large vessel territory infarction. No mass lesion, hemorrhage, hydrocephalus or extra-axial collection. Vascular: Major vessels at the base of the brain show flow. Skull and upper cervical spine: Negative  Sinuses/Orbits: No significant sinus inflammation. Bilateral mastoid effusions, often seen with mechanical ventilation. Orbits negative. Other: None IMPRESSION: No acute brain finding. Mild chronic small-vessel ischemic change of the white matter, fairly typical for age. Electronically Signed   By: Paulina Fusi M.D.   On: 09/13/2020 14:39   CARDIAC CATHETERIZATION  Result Date: 09/24/2020 Conclusions: 1. Mild to moderate, non-obstructive coronary artery disease including 20-35% distal LMCA and 50% ostial LAD disease, similar to prior catheterization when FFR was not hemodynamically significant. 2. Patent proximal RCA stents with mild to moderate in-stent restenosis (30-40%). 3. Normal left and right heart filling pressures. 4. Normal pulmonary artery pressures. 5. Normal cardiac output/index. Recommendations: 1. Maintain net even fluid balance. 2. Optimize goal-directed medical therapy with plans to repeat limited echo in 4-6 weeks. 3. Continue secondary prevention of coronary artery disease. Yvonne Kendall, MD [facility]   DG Chest Port 1 View  Result Date: 09/21/2020 CLINICAL DATA:  Shortness of breath EXAM: PORTABLE CHEST 1 VIEW COMPARISON:  Sep 17, 2020 FINDINGS: Endotracheal tube and nasogastric tube have been removed. No pneumothorax. There is atelectatic change in the lung bases. The lungs otherwise are clear. Heart size and pulmonary vascularity are normal. No adenopathy. There is aortic atherosclerosis. No bone lesions. IMPRESSION: No pneumothorax. Bibasilar atelectasis. There may be subtle interspersed infiltrate in these areas in the bases. Lungs otherwise clear. Heart size normal.  Aortic Atherosclerosis (ICD10-I70.0). Electronically Signed   By: Bretta Bang III M.D.   On: 09/21/2020 09:57   DG Chest Port 1 View  Result Date: 09/17/2020 CLINICAL DATA:  Hypoxia, increased respiratory distress, history coronary artery disease post MI, COPD, hypertension, former smoker EXAM: PORTABLE  CHEST 1 VIEW COMPARISON:  Portable exam 0922 hours compared to  09/16/2020 FINDINGS: Tip of endotracheal tube projects 3.0 cm above carina. Nasogastric tube coiled in proximal stomach. Upper normal heart size with slight pulmonary vascular congestion. Mediastinal contours normal. Atherosclerotic calcification aorta. Lungs clear. No definite infiltrate, pleural effusion, or pneumothorax. IMPRESSION: No acute abnormalities. Aortic Atherosclerosis (ICD10-I70.0). Electronically Signed   By: Lavonia Dana M.D.   On: 09/17/2020 12:36   DG Chest Port 1 View  Result Date: 09/16/2020 CLINICAL DATA:  Acute respiratory failure. EXAM: PORTABLE CHEST 1 VIEW COMPARISON:  09/15/2020. FINDINGS: Endotracheal tube, NG tube, right IJ line in stable position. Heart size normal. Persistent but improved bilateral interstitial infiltrates. Tiny left pleural effusion cannot be excluded. No pneumothorax. IMPRESSION: 1.  Lines and tubes in stable position. 2.  Persistent but improved bilateral interstitial infiltrates. Electronically Signed   By: Marcello Moores  Register   On: 09/16/2020 07:44   DG Chest Port 1 View  Result Date: 09/15/2020 CLINICAL DATA:  Acute respiratory failure with hypoxia EXAM: PORTABLE CHEST 1 VIEW COMPARISON:  Radiograph 09/14/2020, CT 09/11/2020 FINDINGS: *Endotracheal tube tip terminates in the mid trachea, approximately 3.2 cm from the expected location of the carina. *Transesophageal tube tip and side-port coiled in the left upper quadrant, distal to the GE junction. *A right IJ catheter tip terminates near the superior cavoatrial junction. *Stable cardiomediastinal contours with a calcified aorta. *Telemetry leads and external support devices overlie the chest. Redemonstration of the heterogeneous opacities throughout much of the left hemithorax and the right lung base as well, overall slightly improved from comparison exam, particular in the periphery of the left lung. No pneumothorax. Suspect trace left effusion.  No visible right effusion. Stable cardiomediastinal contours with a calcified aorta. No acute osseous or soft tissue abnormality. Degenerative changes are present in the imaged spine and shoulders. IMPRESSION: Lines and tubes as above. Bilateral heterogeneous opacities in the lungs with fairly significant interval clearing of the left lung periphery. Trace residual left effusion. Aortic Atherosclerosis (ICD10-I70.0). Electronically Signed   By: Lovena Le M.D.   On: 09/15/2020 03:48   DG Chest Port 1 View  Result Date: 09/14/2020 CLINICAL DATA:  Acute respiratory failure with hypoxia EXAM: PORTABLE CHEST 1 VIEW COMPARISON:  Two days ago FINDINGS: Airspace disease asymmetric to the left and progressed. No visible effusion or pneumothorax. Endotracheal tube with tip between the clavicular heads and carina. Right IJ line with tip at the upper cavoatrial junction. Enteric tube with tip at the stomach. Normal heart size IMPRESSION: 1. Worsening airspace disease on the left. 2. Unremarkable hardware. Electronically Signed   By: Monte Fantasia M.D.   On: 09/14/2020 10:49   DG Chest Port 1 View  Result Date: 09/12/2020 CLINICAL DATA:  Central line placement EXAM: PORTABLE CHEST 1 VIEW COMPARISON:  09/12/2020 at 4 a.m. FINDINGS: Single frontal view of the chest demonstrates interval placement of a right internal jugular catheter tip overlying the atriocaval junction. Endotracheal tube overlies tracheal air column tip midway between thoracic inlet and carina. Enteric catheter tip projects over the gastric fundus. Cardiac silhouette is stable. Continued left greater than right ground-glass airspace disease. Small left effusion again noted. No pneumothorax. IMPRESSION: 1. No complication after central venous catheter placement. 2. Bilateral left greater than right ground-glass airspace disease worrisome for multifocal pneumonia. 3. Stable trace left effusion. Electronically Signed   By: Randa Ngo M.D.   On:  09/12/2020 22:39   DG Chest Port 1 View  Result Date: 09/12/2020 CLINICAL DATA:  Acute respiratory failure, intubation EXAM: PORTABLE CHEST 1 VIEW  COMPARISON:  Radiograph and CT 09/11/2020 FINDINGS: *Low positioning of the endotracheal tube 1.5 cm from the carina. *Transesophageal tube tip and side port distal to the GE junction, coiled in the left upper quadrant. *Telemetry leads and external support devices overlie the chest. Persistent multifocal patchy and interstitial opacities are present throughout both lungs, most pronounced within the left hemithorax though not significantly changed from recent comparison radiograph and CT. Small left effusion. Cardiomediastinal contours are unchanged from prior with a calcified aorta. No acute osseous or soft tissue abnormality. IMPRESSION: Persistent widespread pulmonary opacity in both lungs, left greater than right. Low positioning of the endotracheal tube 1.5 cm from the carina, consider retraction 2-3 cm to the mid trachea. Additional support devices as above. Aortic Atherosclerosis (ICD10-I70.0). These results will be called to the ordering clinician or representative by the Radiologist Assistant, and communication documented in the PACS or [employer]. Electronically Signed   By: Lovena Le M.D.   On: 09/12/2020 06:32   DG Chest Port 1 View  Result Date: 09/11/2020 CLINICAL DATA:  75 year old female with unexplained altered mental status. Intubated and unresponsive. EXAM: PORTABLE CHEST 1 VIEW COMPARISON:  Portable chest 0043 hours today and earlier. FINDINGS: Portable AP semi upright view at 0706 hours. Stable lines and tubes. Mildly regressed widespread patchy airspace and interstitial opacity in the left lung from earlier today, now resembling that on 09/10/2020. Allowing for portable technique the right lung remains clear. No pneumothorax. Normal cardiac size and mediastinal contours. Negative visible bowel gas pattern. No acute osseous abnormality  identified. IMPRESSION: 1. Mildly improved widespread left lung opacity from earlier today. No new cardiopulmonary abnormality. 2.  Stable lines and tubes. Electronically Signed   By: Genevie Ann M.D.   On: 09/11/2020 09:22   DG Chest Portable 1 View  Result Date: 09/11/2020 CLINICAL DATA:  Endotracheal tube and orogastric tube placement. EXAM: PORTABLE CHEST 1 VIEW COMPARISON:  September 10, 2020 FINDINGS: An endotracheal tube is seen with its distal tip approximately 3.9 cm from the carina. A nasogastric tube is noted with its distal tip overlying the expected region of the gastric fundus. Marked severity infiltrate is seen throughout the left lung. This is mildly increased in severity when compared to the prior study. Mild, stable right basilar atelectasis and/or infiltrate is seen. There is no evidence of a pleural effusion or pneumothorax. The heart size and mediastinal contours are within normal limits. There is moderate severity calcification of the aortic arch. The visualized skeletal structures are unremarkable. IMPRESSION: Interval endotracheal tube and nasogastric tube placement positioning, as described above. Electronically Signed   By: Virgina Norfolk M.D.   On: 09/11/2020 02:57   DG Chest Portable 1 View  Result Date: 09/10/2020 CLINICAL DATA:  Respiratory distress. EXAM: PORTABLE CHEST 1 VIEW COMPARISON:  June 04, 2016. FINDINGS: The heart size and mediastinal contours are within normal limits. No pneumothorax or pleural effusion is noted. New large left lower lobe airspace opacity is noted concerning for pneumonia. Minimal right basilar atelectasis or infiltrate is noted. The visualized skeletal structures are unremarkable. IMPRESSION: New large left lower lobe airspace opacity is noted concerning for pneumonia. Aortic Atherosclerosis (ICD10-I70.0). Electronically Signed   By: Marijo Conception M.D.   On: 09/10/2020 14:51   ECHOCARDIOGRAM COMPLETE  Result Date: 09/11/2020    ECHOCARDIOGRAM  REPORT   Patient Name:   RILEYANNE CLINESMITH Date of Exam: 09/11/2020 Medical Rec #:  [id number]         Height:  59.0 in Accession #:    [id number]        Weight:       140.2 lb Date of Birth:  1946-03-02         BSA:          1.586 m Patient Age:    38 years          BP:           136/88 mmHg Patient Gender: F                 HR:           88 bpm. Exam Location:  [facility] Procedure: 2D Echo, Cardiac Doppler and Color Doppler Indications:     NSTEMI I21.4  History:         Patient has prior history of Echocardiogram examinations. CAD;                  Risk Factors:Hypertension.  Sonographer:     Alyse Low Roar Referring Phys:  [id number] Rhetta Mura Diagnosing Phys: Kate Sable MD IMPRESSIONS  1. Recommend limited echo with iv contrast agent (definity) for addequate assessment of LVEF and wall motion.. Left ventricular ejection fraction, by estimation, is 25 to 30%. The left ventricle has severely decreased function. Left ventricular endocardial border not optimally defined to evaluate regional wall motion. Left ventricular diastolic parameters are indeterminate.  2. Right ventricular systolic function is normal. The right ventricular size is normal.  3. The mitral valve was not well visualized. Mild mitral valve regurgitation.  4. The aortic valve was not well visualized. Aortic valve regurgitation is not visualized. FINDINGS  Left Ventricle: Recommend limited echo with iv contrast agent (definity) for addequate assessment of LVEF and wall motion. Left ventricular ejection fraction, by estimation, is 25 to 30%. The left ventricle has severely decreased function. Left ventricular endocardial border not optimally defined to evaluate regional wall motion. The left ventricular internal cavity size was normal in size. There is no left ventricular hypertrophy. Left ventricular diastolic parameters are indeterminate. Right Ventricle: The right ventricular size is normal. No increase in right ventricular wall  thickness. Right ventricular systolic function is normal. Left Atrium: Left atrial size was normal in size. Right Atrium: Right atrial size was normal in size. Pericardium: There is no evidence of pericardial effusion. Mitral Valve: The mitral valve was not well visualized. Mild mitral valve regurgitation. Tricuspid Valve: The tricuspid valve is not well visualized. Tricuspid valve regurgitation is mild. Aortic Valve: The aortic valve was not well visualized. Aortic valve regurgitation is not visualized. Aortic valve peak gradient measures 4.1 mmHg. Pulmonic Valve: The pulmonic valve was not well visualized. Pulmonic valve regurgitation is not visualized. Aorta: The aortic root is normal in size and structure. Venous: IVC assessment for right atrial pressure unable to be performed due to mechanical ventilation. IAS/Shunts: No atrial level shunt detected by color flow Doppler.  LEFT VENTRICLE PLAX 2D LVIDd:         4.20 cm  Diastology LVIDs:         3.77 cm  LV e' medial:    10.80 cm/s LV PW:         1.09 cm  LV E/e' medial:  12.5 LV IVS:        1.21 cm  LV e' lateral:   9.25 cm/s LVOT diam:     1.70 cm  LV E/e' lateral: 14.6 LVOT Area:     2.27 cm  RIGHT VENTRICLE RV  Mid diam:    2.36 cm LEFT ATRIUM           Index       RIGHT ATRIUM          Index LA diam:      3.05 cm 1.92 cm/m  RA Area:     5.91 cm LA Vol (A2C): 16.9 ml 10.66 ml/m RA Volume:   7.72 ml  4.87 ml/m LA Vol (A4C): 25.1 ml 15.83 ml/m  AORTIC VALVE                PULMONIC VALVE AV Area (Vmax): 1.65 cm    PV Vmax:        0.98 m/s AV Vmax:        101.00 cm/s PV Peak grad:   3.8 mmHg AV Peak Grad:   4.1 mmHg    RVOT Peak grad: 3 mmHg LVOT Vmax:      73.50 cm/s  AORTA Ao Root diam: 2.10 cm MITRAL VALVE                TRICUSPID VALVE MV Area (PHT): 5.97 cm     TR Peak grad:   28.1 mmHg MV Decel Time: 127 msec     TR Vmax:        265.00 cm/s MV E velocity: 135.00 cm/s                             SHUNTS                             Systemic Diam: 1.70  cm Kate Sable MD Electronically signed by Kate Sable MD Signature Date/Time: 09/11/2020/2:41:45 PM    Final     Microbiology: No results found for this or any previous visit (from the past 240 hour(s)).   Labs: Basic Metabolic Panel: Recent Labs  Lab 09/22/20 0418 09/23/20 0426 09/24/20 0430 09/25/20 0328 09/26/20 0328 09/27/20 0457  NA 146* 140 138 137 135 136  K 4.4 3.9 3.6 3.7 3.8 3.5  CL 112* 107 108 106 105 105  CO2 25 23 21* 23 20* 22  GLUCOSE 98 98 102* 87 103* 117*  BUN 57* 44* 36* 31* 32* 30*  CREATININE 0.56 0.51 0.43* 0.53 0.52 0.48  CALCIUM 9.5 8.7* 8.6* 9.0 8.8* 8.8*  MG 2.5*  --   --   --   --   --   PHOS 3.5  --   --   --   --   --    Liver Function Tests: No results for input(s): AST, ALT, ALKPHOS, BILITOT, PROT, ALBUMIN in the last 168 hours. No results for input(s): LIPASE, AMYLASE in the last 168 hours. No results for input(s): AMMONIA in the last 168 hours. CBC: Recent Labs  Lab 09/24/20 0430 09/25/20 0328 09/26/20 0404 09/27/20 0457 09/28/20 0434  WBC 18.7* 17.8* 17.8* 16.0* 10.8*  NEUTROABS 12.5* 11.4* 12.0* 11.1* 5.8  HGB 12.5 12.9 13.9 13.3 11.9*  HCT 40.1 40.4 44.3 40.8 38.3  MCV 96.6 95.7 95.9 94.7 95.8  PLT 296 327 336 346 339   Cardiac Enzymes: No results for input(s): CKTOTAL, CKMB, CKMBINDEX, TROPONINI in the last 168 hours. BNP: BNP (last 3 results) Recent Labs    09/11/20 0437 09/11/20 1720 09/12/20 0441  BNP 1,009.8* 1,298.4* 970.2*    ProBNP (last 3 results) No results for input(s): PROBNP in the last  8760 hours.  CBG: Recent Labs  Lab 09/27/20 1536 09/27/20 2032 09/28/20 0053 09/28/20 0443 09/28/20 0802  GLUCAP 116* 118* 117* 117* 115*       Signed:  Domenic Polite MD.  [facility] 09/28/2020, 11:32 AM

## 2020-09-28 NOTE — Progress Notes (Signed)
Occupational Therapy Treatment Patient Details Name: Kathryn Garrett MRN: [id number] DOB: 21-Jan-1946 Today's Date: 09/28/2020    History of present illness 75 y.o. female with history of CAD with VF arrest s/p PCI to the RCA in 2017 s/p PTCA to the RCA for ISR in 2018, chronic combined systolic and diastolic CHF, COPD with ongoing tobacco use, HTN, HLD, anemia, and narcotic abuse on Suboxone therapy admitted with acute hypoxic respiratory failure requiring mechanical ventilation (extubated 09/17/2020) secondary to COPD exacerbation Klebsiella PNA and acute on chronic combined CHF with transient ST elevation and echo demonstrating new LV dysfunction.   OT comments  Pt seen for OT treatment on this date. Upon arrival to room, pt awake and seated upright in recliner. Pt agreeable to OT session. While preparing room for functional mobility, urine noted to be on the floor with pt stating that [place] may have moved when she was shifting weight in chair. Pt agreeable to assist from OT for bathing/dressing. During sit>stand LB bathing, pt was able to bend over and wash anterior portions of LE with unilateral UE support from RW, requiring MIN A for steadying only (HR 120s). Pt required MAX A to wash posterior portion of LE while standing. Pt required SUPERVISION/SET-UP for seated UB dressing. Pt motivated to participate in UB exercises this date, and was provided HEP consisting of x10 reps of scapula retraction, shoulder flexion, shoulder horizontal abduction, chair push-ups, and elbow flexion/extension. Pt was encouraged to perform above exercises 3x/day, with pt verbalizing understanding. Pt remains extremely motivated to participate in therapy and is making good progress toward goals. Pt continues to benefit from skilled OT services to maximize return to PLOF. Will continue to follow POC. Discharge recommendation remains appropriate (if unable to go to CIR d/t insurance, recommend at least STR at [facility]).    Follow Up Recommendations  CIR    Equipment Recommendations  3 in 1 bedside commode;Tub/shower seat       Precautions / Restrictions Precautions Precautions: Fall Restrictions Weight Bearing Restrictions: No       Mobility Bed Mobility               General bed mobility comments: not assessed, pt in recliner upon arrival    Transfers Overall transfer level: Needs assistance Equipment used: Rolling walker (2 wheeled) Transfers: Sit to/from Stand Sit to Stand: Min assist         General transfer comment: increased time, cues for safe hand placement/sequence    Balance Overall balance assessment: Needs assistance Sitting-balance support: No upper extremity supported;Feet unsupported Sitting balance-Leahy Scale: Fair     Standing balance support: During functional activity;Single extremity supported Standing balance-Leahy Scale: Poor Standing balance comment: MIN A for steadying during LB bathing                           ADL either performed or assessed with clinical judgement   ADL Overall ADL's : Needs assistance/impaired             Lower Body Bathing: Moderate assistance;Sit to/from stand Lower Body Bathing Details (indicate cue type and reason): MOD A to wash posterior LE. Pt able to wash anterior LE with washcloth following MIN A for steadying and unilateral UE support on RW Upper Body Dressing : Supervision/safety;Set up;Sitting Upper Body Dressing Details (indicate cue type and reason): to don/doff hospital gown  Cognition Arousal/Alertness: Awake/alert Behavior During Therapy: WFL for tasks assessed/performed Overall Cognitive Status: Within Functional Limits for tasks assessed                                          Exercises General Exercises - Upper Extremity Shoulder Flexion: AROM;Strengthening;Both;10 reps;Seated Shoulder Horizontal ABduction:  AROM;Strengthening;Both;10 reps;Seated Elbow Flexion: AROM;Strengthening;Both;10 reps;Seated Chair Push Up: AROM;Both;10 reps;Seated Other Exercises Other Exercises: x10 reps of scapular retraction           Pertinent Vitals/ Pain       Pain Assessment: No/denies pain         Frequency  Min 3X/week        Progress Toward Goals  OT Goals(current goals can now be found in the care plan section)  Progress towards OT goals: Progressing toward goals  Acute Rehab OT Goals Patient Stated Goal: get better so I can get back to work OT Goal Formulation: With patient/family Time For Goal Achievement: 10/03/20 Potential to Achieve Goals: Good  Plan Discharge plan remains appropriate;Frequency remains appropriate       AM-PAC OT "6 Clicks" Daily Activity     Outcome Measure   Help from another person eating meals?: A Little Help from another person taking care of personal grooming?: A Little Help from another person toileting, which includes using toliet, bedpan, or urinal?: A Lot Help from another person bathing (including washing, rinsing, drying)?: A Lot Help from another person to put on and taking off regular upper body clothing?: A Little Help from another person to put on and taking off regular lower body clothing?: A Lot 6 Click Score: 15    End of Session Equipment Utilized During Treatment: Gait belt;Rolling walker  OT Visit Diagnosis: Unsteadiness on feet (R26.81);Muscle weakness (generalized) (M62.81);Other symptoms and signs involving cognitive function   Activity Tolerance Patient tolerated treatment well   Patient Left in bed;with call bell/phone within reach;with chair alarm set   Nurse Communication Mobility status        Time: 5621-3086 OT Time Calculation (min): 33 min  Charges: OT General Charges $OT Visit: 1 Visit OT Treatments $Self Care/Home Management : 8-22 mins $Therapeutic Activity: 8-22 mins  Fredirick Maudlin, OTR/L [place]

## 2020-09-28 NOTE — Consult Note (Signed)
Consultation Note Date: 09/28/2020   Patient Name: Kathryn Garrett  DOB: 1945/11/25  MRN: [id number]  Age / Sex: 75 y.o., female  PCP: Imagene Riches, NP Referring Physician: Domenic Polite, MD  Reason for Consultation: Establishing goals of care  HPI/Patient Profile: 75 year old female presented to the [facility] ED from home with complaints of shortness of breath.  Patient reported 2 to 3 days of progressive shortness of breath and associated new onset nonproductive cough as well as a subjective fever.   Clinical Assessment and Goals of Care: Patient is sitting in bedside chair with lunch in front of her.  It is a dysphagia honey thick diet.  She states that she does not like this diet.  Patient was noted to clear her throat several times after just 1 bite.  SLP came in to speak with patient for a few moments as a swallow evaluation was just completed.  Discussed her esophageal dysmotility and what this could mean.  Moderate risk for aspiration even with following the recommended diet and doing the recommended interventions to help with swallowing.  We discussed her diagnoses, prognosis, GOC, EOL wishes disposition and options.  Created space and opportunity for patient  to explore thoughts and feelings regarding current medical information.   She states that she has been using Tums for years.  A detailed discussion was had today regarding advanced directives.  Concepts specific to code status, artifical feeding and hydration, IV antibiotics and rehospitalization were discussed.  The difference between an aggressive medical intervention path and a comfort care path was discussed.  Values and goals of care important to patient and family were attempted to be elicited.  Discussed limitations of medical interventions to prolong quality of life in some situations and discussed the concept of human  mortality.  Discussed a path of continued treatment with likely progression to a need for a PEG tube based on conversations.  She states she is not amenable to a PEG tube.  She states that this would not work with her quality of life as she is still working as a Theme park manager.  Discussed the path of comfort, and discussed that without diet modification she would likely develop pneumonia again, and potentially quickly.  She is unsure if she developed pneumonia again if she would want to return to the hospital or not.  She is unsure on her feelings of focusing on comfort, and states she does not like the idea of hospice.  She states comfort focused care would not allow her to continue to work as a Theme park manager either.  She is unsure of her feelings on being placed on the ventilator or having CPR in the future.  She states she needs to think about all of this.  Of note: She is very clear that she does not want me to speak with her daughters at all.  She states that they would just "spaz out" and is not sure what they would do, or if they would make decisions in line with her wishes.  She  also states that she wants this current note blocked so that they are unable to see it.  Discussed that I would return tomorrow to talk with her further. She is unsure what time her family will visit; we agreed that if someone was in the room that we would ask them to step out so that we could speak privately, as she does want to continue conversations.    I did discuss with her that if something happens and she is unable to make decisions, she needs to have a decision maker in place, and someone who would make the decisions that she would want them to make.  Discussed that HPOA papers and a living will can be completed during this hospitalization.       SUMMARY OF RECOMMENDATIONS   Will return tomorrow for further [place] conversations, as patient is amenable to conversations.  However, this note will be locked at patient  request, and there will be no communication with her daughters per her request.    Prognosis:   Poor      Primary Diagnoses: Present on Admission: . Acute exacerbation of chronic obstructive pulmonary disease (COPD) ([place]) . Severe sepsis ([place]) . CAP (community acquired pneumonia) . NSTEMI (non-ST elevated myocardial infarction) ([place]) . Hyperglycemia . SOB (shortness of breath) . HTN (hypertension) . Hyperlipidemia LDL goal <70 . Tobacco abuse . Acute respiratory failure ([place])   I have reviewed the medical record, interviewed the patient and family, and examined the patient. The following aspects are pertinent.  Past Medical History:  Diagnosis Date  . Anemia, mild   . Arthritis    "qwhere" (02/10/2016)  . CAD (coronary artery disease)    a. VF arrest 01/2009/CAD with inferoposterior MI s/p aspiration thrombectomy/BMS of RCA at that time. b. ISR of BMS s/p DES to RCA 06/2010 with moderate LM/LAD disease not significant by FFR. c. s/p angiosculpt PTCA to prox RCA 01/2016 for ISR. d. Aggressive PTCA to prox RCA for ISR 05/2016.  . Cardiac arrest ([place]) 01/2009   a. in setting of inf-post STEMI 01/2009 (VF).  . Chest pain 06/04/2016  . Chronic bronchitis ([place])   . Chronic combined systolic and diastolic CHF (congestive heart failure) ([place])    a. EF 40-45% by cath 2010. b. 60-65% with grade 1 DD by echo 09/2015  . COPD (chronic obstructive pulmonary disease) ([place])   . Hyperlipidemia 01/28/2016  . Hyperlipidemia LDL goal <70 01/28/2016  . Hypertension   . Lung mass   . MI (myocardial infarction) ([place]) 01/2009  . Narcotic abuse ([place])    pt now taking Suboxone tid  . Pleural effusion on left 09/24/2015  . Pneumonia 06/2014; 09/2015  . Pre-diabetes   . Pulmonary nodule   . PVD (peripheral vascular disease) ([facility])    mild atherosclerosis of infrarenal aorta, 25% ostial left renal artery stenosis, 50% ostial right common iliac by cath 2010  . S/P PTCA (percutaneous transluminal coronary  angioplasty) 02/10/16 to RCA lesion for in stent restenois 02/11/2016  . Subclavian artery stenosis ([facility])    a. >50% by duplex 05/2016.  . Tobacco abuse   . Unstable angina [facility])    Social History   Socioeconomic History  . Marital status: Legally Separated    Spouse name: Not on file  . Number of children: Not on file  . Years of education: Not on file  . Highest education level: Not on file  Occupational History  . Not on file  Tobacco Use  . Smoking status:  Current Every Day Smoker    Packs/day: 0.50    Years: 56.00    Pack years: 28.00    Types: Cigarettes  . Smokeless tobacco: Never Used  Vaping Use  . Vaping Use: Never used  Substance and Sexual Activity  . Alcohol use: No    Alcohol/week: 0.0 standard drinks  . Drug use: No  . Sexual activity: Not Currently  Other Topics Concern  . Not on file  Social History Narrative  . Not on file   Social Determinants of Health   Financial Resource Strain: Not on file  Food Insecurity: Not on file  Transportation Needs: Not on file  Physical Activity: Not on file  Stress: Not on file  Social Connections: Not on file   Family History  Problem Relation Age of Onset  . Coronary artery disease Father   . Hyperlipidemia Father   . Early death Father   . Heart attack Father   . Coronary artery disease Mother   . Hyperlipidemia Mother   . Heart attack Mother   . Cancer Brother    Scheduled Meds: . aspirin  81 mg Oral QHS  . budesonide (PULMICORT) nebulizer solution  0.25 mg Nebulization BID  . buprenorphine-naloxone  2 tablet Sublingual Daily  . Chlorhexidine Gluconate Cloth  6 each Topical QHS  . clopidogrel  75 mg Oral QHS  . enoxaparin (LOVENOX) injection  40 mg Subcutaneous Q24H  . feeding supplement (NEPRO CARB STEADY)  237 mL Oral TID BM  . fluticasone  1 spray Each Nare Daily  . insulin aspart  0-15 Units Subcutaneous Q4H  . levothyroxine  88 mcg Oral Q0600  . loratadine  10 mg Oral Daily  . mouth rinse  15  mL Mouth Rinse BID  . metoprolol succinate  50 mg Oral Daily  . multivitamin with minerals  1 tablet Oral Daily  . pantoprazole  40 mg Oral Daily  . rosuvastatin  40 mg Oral QHS  . sacubitril-valsartan  1 tablet Oral BID  . sodium chloride flush  3 mL Intravenous Q12H  . sodium chloride flush  3 mL Intravenous Q12H   Continuous Infusions: . sodium chloride Stopped (09/24/20 1321)   PRN Meds:.acetaminophen **OR** acetaminophen, albuterol, artificial tears, docusate, guaiFENesin, polyethylene glycol Medications Prior to Admission:  Prior to Admission medications   Medication Sig Start Date End Date Taking? Authorizing Provider  acetaminophen (TYLENOL) 500 MG tablet Take 1,000 mg by mouth every 6 (six) hours as needed for headache.   Yes [provider]  albuterol (PROVENTIL HFA;VENTOLIN HFA) 108 (90 Base) MCG/ACT inhaler Inhale 2 puffs into the lungs every 4 (four) hours as needed for wheezing or shortness of breath.   Yes [provider]  amoxicillin (AMOXIL) 500 MG tablet Take 500 mg by mouth 3 (three) times daily. 08/03/20  Yes [provider]  aspirin EC 81 MG tablet Take 81 mg by mouth at bedtime.    Yes [provider]  azelastine (ASTELIN) 0.1 % nasal spray USE 2 SPRAYS BID UNTIL DIRECTED TO STOP. USE FOR RUNNY NOSE 08/14/17  Yes [provider]  Buprenorphine HCl-Naloxone HCl 8-2 MG FILM Place 0.5 Film under the tongue daily.   Yes [provider]  cetirizine (ZYRTEC) 10 MG tablet Take 1 tablet (10 mg total) by mouth daily. 03/11/18  Yes Lacy Duverney M, PA-C  clopidogrel (PLAVIX) 75 MG tablet TAKE 1 TABLET(75 MG) BY MOUTH AT BEDTIME 09/16/20  Yes Jettie Booze, MD  dexamethasone (DECADRON) 6  MG tablet dexamethasone 6 mg tablet  TAKE 1 TABLET BY MOUTH EVERY DAY   Yes [provider]  doxycycline (VIBRAMYCIN) 100 MG capsule doxycycline hyclate 100 mg capsule  TAKE 1 CAPSULE BY MOUTH TWICE DAILY   Yes [provider]  fluticasone (FLONASE) 50 MCG/ACT nasal spray Place 1 spray into both nostrils daily.   Yes [provider]  furosemide (LASIX) 40 MG tablet Take 1 tablet (40 mg total) by mouth daily as needed (swelling). 01/16/20  Yes Jettie Booze, MD  ibuprofen (ADVIL,MOTRIN) 600 MG tablet Take 600 mg by mouth as needed for pain. 08/14/17  Yes [provider]  isosorbide mononitrate (IMDUR) 30 MG 24 hr tablet TAKE 1 TABLET(30 MG) BY MOUTH DAILY 09/16/20  Yes Jettie Booze, MD  levothyroxine (SYNTHROID) 88 MCG tablet Take 88 mcg by mouth every morning. 05/26/20  Yes [provider]  metoprolol tartrate (LOPRESSOR) 25 MG tablet TAKE 1 TABLET BY MOUTH TWICE DAILY 09/16/20  Yes Jettie Booze, MD  mupirocin ointment (BACTROBAN) 2 % mupirocin 2 % topical ointment  APPLY TOPICALLY TO LOWER LEG THREE TIMES DAILY FOR 10 DAYS   Yes [provider]  nystatin cream (MYCOSTATIN) Apply topically 2 (two) times daily. 08/11/20  Yes [provider]  potassium chloride SA (KLOR-CON) 20 MEQ tablet Take 1 tablet (20 mEq total) by mouth daily as needed (Take with furosemide (lasix)). 01/16/20  Yes Jettie Booze, MD  predniSONE (DELTASONE) 20 MG tablet prednisone 20 mg tablet   Yes [provider]  rosuvastatin (CRESTOR) 20 MG tablet TAKE 1 TABLET(20 MG) BY MOUTH DAILY 09/16/20  Yes Jettie Booze, MD  TRELEGY ELLIPTA 100-62.5-25 MCG/INH AEPB Take 1 puff by mouth daily. 08/13/20  Yes [provider]  triamcinolone cream (KENALOG) 0.1 % Apply topically 2 (two) times daily. to affected area 08/11/20  Yes [provider]  metoprolol succinate (TOPROL-XL) 50 MG 24 hr tablet Take 1 tablet (50 mg total) by mouth daily. Take with or immediately following a meal. 09/29/20   Domenic Polite, MD  nitroGLYCERIN (NITROSTAT) 0.4 MG SL tablet Place 1 tablet (0.4 mg total) under the tongue every 5 (five) minutes as needed for chest pain. 03/12/18   Jettie Booze, MD  pantoprazole (PROTONIX) 40 MG tablet Take 1 tablet (40 mg total) by mouth daily. 09/29/20   Domenic Polite, MD  sacubitril-valsartan (ENTRESTO) 24-26 MG Take 1 tablet by mouth 2 (two) times daily. 09/28/20   Domenic Polite, MD   Allergies  Allergen Reactions  . Azithromycin Rash   Review of Systems  Constitutional: Positive for activity change.  Respiratory: Positive for cough.     Physical Exam Pulmonary:     Comments: Cough Neurological:     Mental Status: She is alert.     Vital Signs: BP (!) 114/49 (BP Location: Left Arm)   Pulse 86   Temp 98 F (36.7 C)   Resp 16   Ht 4\' 11"  (1.499 m)   Wt 57.4 kg   SpO2 90%   BMI 25.57 kg/m  Pain Scale: 0-10 POSS *See Group Information*: 1-Acceptable,Awake and alert Pain Score: 0-No pain   SpO2: SpO2: 90 % O2 Device:SpO2: 90 % O2 Flow Rate: .O2 Flow Rate (L/min): 1 L/min  IO: Intake/output summary:   Intake/Output Summary (Last 24 hours) at 09/28/2020 1319 Last data filed at 09/28/2020 0900 Gross per 24 hour  Intake 120 ml  Output 200 ml  Net -80 ml  LBM: Last BM Date: 09/25/20 Baseline Weight: Weight: 68.2 kg Most recent weight: Weight: 57.4 kg        Time In: 12:00 Time Out: 1:00 Time Total: 60 min Greater than 50%  of this time was spent counseling and coordinating care related to the above assessment and plan.  Signed by: Asencion Gowda, NP   Please contact Palliative Medicine Team phone at [phone] for questions and concerns.  For individual provider: See Shea Evans

## 2020-09-28 NOTE — Progress Notes (Signed)
Inpatient Rehabilitation Admissions Coordinator  [employer] has denied CIR admit. Feel her medical care can be managed at a lower level of care/SNF. I contacted patient and her daughter, Amy, by phone, as well as discussed with Dr. Broadus John by phone. No medical justification to appeal. I have alerted acute team and TOC, Bridgett, SW. We will sign off. Patient and family wish to pursue SNF at this time.  Danne Baxter, RN, MSN Rehab Admissions Coordinator [phone] 09/28/2020 12:04 PM

## 2020-09-28 NOTE — Progress Notes (Signed)
NAME:  Kathryn Garrett, MRN:  [id number], DOB:  08/12/1945, LOS: 16 ADMISSION DATE:  09/10/2020, CONSULTATION DATE: 09/11/2020 REFERRING MD: Dr. Damita Dunnings, CHIEF COMPLAINT: Shortness of breath  History of Present Illness:  75 year old female presented to the [facility] ED from home with complaints of shortness of breath.  Patient reported 2 to 3 days of progressive shortness of breath and associated new onset nonproductive cough as well as a subjective fever.  Per ED documentation she denied associated chest pain/palpitations/diaphoresis.  And denies any recent nausea/vomiting, chills/rigors/myalgias.  Patient confirmed a history of COPD and current smoking history stating she is not on any supplemental oxygen at baseline.  ED course: Patient received cefepime due to concerns for a left lower lobe infiltrate, 500 mL NS bolus & IV fluids at 150 mL an hour Initial vitals: Afebrile at 98, tachypneic at 22, tachycardic at 108, BP 127/72 & 99% on BiPAP Significant labs: Serum bicarb 15, hyperglycemic at 350, troponin 70 > 745 > 1838, BNP 222.6, VBG revealed metabolic acidosis: 3.41/93/790/24.0, leukocytosis at 40.2, lactic acidosis 2.5> 2.2 > 2.5  TRH hospitalist were consulted for admission.  Around midnight PCCM was called due to patient's deteriorating respiratory status.  Per ED provider Dr. Karma Greaser and care RN the patient complained that " I can't breathe", was given a dose of Ativan and became somnolent on BiPAP.  Coarse crackles auscultated bilaterally, and the decision was made to emergently intubate the patient and place her on mechanical ventilation. PCCM consulted for management  Significant Hospital Events: Including procedures, antibiotic start and stop dates in addition to other pertinent events   . 09/10/2020-patient admits admitted with hospitalist service for COPD exacerbation and left lower lobe pneumonia . 09/11/2020-overnight patient had suspected flash pulmonary edema, dyspnea followed by  somnolence requiring emergent intubation and mechanical ventilation.  Admitted to ICU . 09/12/20- patient failed SBT , she had blood tinged secretions fromETT and aggitation, family was at bedside and we agreed on giving her more time and try again.  . 09/14/20- Failed SBT (became hypoxic with sats mid 80's, increased WOB and assessory muscle use, HR 140-160's). Will diurese today and add Metoprolol.  Tracheal aspirate from 4/30 with KLEBSIELLA PNEUMONIAE (resistant to Ampicillin)   . 09/15/20- Failed SBT (increased WOB and assessory muscle use, RR 40's, HR 140-150's, Hypertensive); plan to add scheduled PO Klonopin and Oxycodone, Lasix 40 mg IV x1, consult Palliative Care . 09/16/20- Worsening Leukocytosis up to 21.7, low grade fever overnight, increased secretions overnight, will repeat Tracheal aspirate, remove central line, add mucinex, plan for SBT . 09/17/20-[name] passed SBT in AM and following commands still having some moderate secretions . 09/18/20-  Extubated yesterday.  Failed swallow eval today.  . 09/20/20- Weaned to room air, Leukocytosis improving; Hemodynamically stable . 09/27/20- patient is coughing up phlegm but clinically is improved, we discussed pneumonia and COPD with outpatient clinic follow up with pulmonology.  There is plan for post dc rehab . 09/28/20- patient is improved and she is being optimized for CIR, pulmonary will sign off and can follow up on outpatient basis  Interim History / Subjective:  No acute events reported overnight Afebrile, Hemodynamically stable, no Vasopressors Weaned to room air Urine output 900 cc over past 24 hrs (+1.3 L since admit), Creatinine remains stable with diuresis Leukocytosis continues to improve    Objective   Blood pressure 90/61, pulse 91, temperature 98 F (36.7 C), resp. rate 17, height 4\' 11"  (1.499 m), weight 57.4 kg, SpO2 96 %.  Intake/Output Summary (Last 24 hours) at 09/28/2020 0823 Last data filed at 09/27/2020 1800 Gross  per 24 hour  Intake 120 ml  Output 800 ml  Net -680 ml   Filed Weights   09/25/20 0711 09/27/20 0500 09/28/20 0428  Weight: 57.2 kg 57.6 kg 57.4 kg    Examination: General: Acute on chronically ill-appearing female, laying in bed, breathing treatment and chest PT via bed currently in place, in no acute distress HEENT: Atraumatic, normocephalic, neck supple, no JVD Neuro: Awake, alert and oriented x3, moves all extremities to command (generalized weakness), no focal deficits, speech clear CV: Tachycardia, regular rate and rhythm with occasional PACs, S1-S2, no murmurs, rubs, gallops, 2+ distal pulses Pulm: Coarse breath sounds bilaterally, no wheezing or rales noted, even, nonlabored GI: Soft, nontender, nondistended, no guarding or rebound tenderness, bowel sounds positive x4 Skin: Limited exam-warm and dry.  No obvious rashes, lesions, ulcerations Extremities: Warm and dry.  No deformities, minimal edema   Labs/imaging that I have personally reviewed  (right click and "Reselect all SmartList Selections" daily)  Labs 09/20/2020: WBC 20.1, K 3.0, glucose 124, BUN 68, Cr. 0.66 Chest x-ray 09/16/2020>>Endotracheal tube, NG tube, right IJ line in stable position. Heart size normal. Persistent but improved bilateral interstitial infiltrates. Tiny left pleural effusion cannot be excluded. No pneumothorax. MRI brain 09/13/2020:No acute brain finding. Mild chronic small-vessel ischemic change of the white matter, fairly typical for age.  Assessment & Plan:   Acute hypoxic respiratory failure secondary to Acute COPD Exacerbation & left lung multifocal pneumonia -S/p Extubation -Supplemental O2 as needed to maintain O2 sats 88 to 94% -Follow intermittent CXR & ABG as needed -Bronchodilators -Continue Budesonide nebs -Completed 7 days course of Cefepime/Ancef -Aggressive pulmonary toilet  Sepsis due to left lung multifocal pneumonia>> tracheal aspirate on 4/30 with KLEBSIELLA PNEUMONIAE  (resistant to Ampicillin)>>treated -Monitor fever curve -Trend WBCs.  Elevated WBC in part related to steroids. -Follow cultures as above -Completed 7 days of Cefepime/Ancef  Acute systolic congestive heart failure Acute non-ST elevation MI New diagnosis of A. fib with RVR -Continuous cardiac monitoring -Patient's ejection fraction on echocardiogram is 30% -Diuresis as renal function and BP permits >> currently on 40 mg IV Lasix daily and will continue -Cardiology following, input is appreciated -Milrinone has been tapered off -Patient completed therapy with IV heparin -Continue aspirin Plavix and statin -She will need left heart cath once stable -Metoprolol BID per tube  Acute metabolic/toxic encephalopathy>>improving -Improved now that she is extubated and off continuous sedation -stop scheduled Klonopin as unable to take p.o. -Was on buprenorphine/naloxone as an outpatient which is now held ~ continue scheduled Oxycodone per tube -Provide supportive care -CT Head at admission negative -MRI Brain 09/13/20 negative -Continue Aspirin, Plavix, Atorvastatin   Best practice (right click and "Reselect all SmartList Selections" daily)  Diet:  NPO.  Repeat swallow evaluation to be performed on 09/20/2020 Pain/Anxiety/Delirium protocol (if indicated): Oxycodone VAP protocol (if indicated): Now extubated. DVT prophylaxis: SCD (chemical prophylaxis held due to previous hemoptysis) GI prophylaxis: H2B Glucose control:  SSI Yes Central venous access: n/a Arterial line:  N/A Foley: N/A Mobility: Out of bed with assistance as tolerated PT consulted: needs Pt/OT Code Status:  full code Disposition: Stepdown   Labs   CBC: Recent Labs  Lab 09/24/20 0430 09/25/20 0328 09/26/20 0404 09/27/20 0457 09/28/20 0434  WBC 18.7* 17.8* 17.8* 16.0* 10.8*  NEUTROABS 12.5* 11.4* 12.0* 11.1* 5.8  HGB 12.5 12.9 13.9 13.3 11.9*  HCT 40.1 40.4 44.3 40.8  38.3  MCV 96.6 95.7 95.9 94.7 95.8  PLT  296 327 336 346 865    Basic Metabolic Panel: Recent Labs  Lab 09/22/20 0418 09/23/20 0426 09/24/20 0430 09/25/20 0328 09/26/20 0328 09/27/20 0457  NA 146* 140 138 137 135 136  K 4.4 3.9 3.6 3.7 3.8 3.5  CL 112* 107 108 106 105 105  CO2 25 23 21* 23 20* 22  GLUCOSE 98 98 102* 87 103* 117*  BUN 57* 44* 36* 31* 32* 30*  CREATININE 0.56 0.51 0.43* 0.53 0.52 0.48  CALCIUM 9.5 8.7* 8.6* 9.0 8.8* 8.8*  MG 2.5*  --   --   --   --   --   PHOS 3.5  --   --   --   --   --    GFR: Estimated Creatinine Clearance: 46.9 mL/min (by C-G formula based on SCr of 0.48 mg/dL). Recent Labs  Lab 09/25/20 0328 09/26/20 0404 09/27/20 0457 09/28/20 0434  WBC 17.8* 17.8* 16.0* 10.8*    Liver Function Tests: No results for input(s): AST, ALT, ALKPHOS, BILITOT, PROT, ALBUMIN in the last 168 hours. No results for input(s): LIPASE, AMYLASE in the last 168 hours. No results for input(s): AMMONIA in the last 168 hours.  ABG    Component Value Date/Time   PHART 7.38 09/15/2020 0500   PCO2ART 41 09/15/2020 0500   PO2ART 123 (H) 09/15/2020 0500   HCO3 33.5 (H) 09/17/2020 0944   TCO2 23 01/14/2009 0327   ACIDBASEDEF 0.8 09/15/2020 0500   O2SAT 88.8 09/17/2020 0944     Coagulation Profile: No results for input(s): INR, PROTIME in the last 168 hours.  Cardiac Enzymes: No results for input(s): CKTOTAL, CKMB, CKMBINDEX, TROPONINI in the last 168 hours.  HbA1C: Hgb A1c MFr Bld  Date/Time Value Ref Range Status  09/11/2020 04:37 AM 6.4 (H) 4.8 - 5.6 % Final    Comment:    (NOTE) Pre diabetes:          5.7%-6.4%  Diabetes:              >6.4%  Glycemic control for   <7.0% adults with diabetes   10/02/2019 12:28 PM 6.4 (H) 4.8 - 5.6 % Final    Comment:             Prediabetes: 5.7 - 6.4          Diabetes: >6.4          Glycemic control for adults with diabetes: <7.0     CBG: Recent Labs  Lab 09/27/20 1536 09/27/20 2032 09/28/20 0053 09/28/20 0443 09/28/20 0802  GLUCAP 116*  118* 117* 117* 115*      Ottie Glazier, M.D.  [facility]

## 2020-09-28 NOTE — Progress Notes (Addendum)
Physical Therapy Treatment Patient Details Name: Kathryn Garrett MRN: [id number] DOB: 04-05-1946 Today's Date: 09/28/2020    History of Present Illness 75 y.o. female with history of CAD with VF arrest s/p PCI to the RCA in 2017 s/p PTCA to the RCA for ISR in 2018, chronic combined systolic and diastolic CHF, COPD with ongoing tobacco use, HTN, HLD, anemia, and narcotic abuse on Suboxone therapy admitted with acute hypoxic respiratory failure requiring mechanical ventilation (extubated 09/17/2020) secondary to COPD exacerbation Klebsiella PNA and acute on chronic combined CHF with transient ST elevation and echo demonstrating new LV dysfunction.    PT Comments    Pt was long sitting in bed upon arriving. She agrees to PT session and is cooperative throughout. " I hope I can get to rehab today." Pt agrees to OOB activity but does endorse severe fatigue. HR at rest 98 bpm. Upon sitting UP EOB HR elevated to 118 bpm. She stood and ambulated ~ 8 ft with HR elevation to 131 bpm. Pt endorses severe fatigue with slightly SOB with very minimal activity. Pt needs prolonged rest throughout session. Did discuss energy conservation and POC going forward. Will need rehab at DC to address deficits while maximizing independence with ADLs.   Follow Up Recommendations  CIR     Equipment Recommendations  None recommended by PT       Precautions / Restrictions Precautions Precautions: Fall Restrictions Weight Bearing Restrictions: No    Mobility  Bed Mobility Overal bed mobility: Needs Assistance Bed Mobility: Supine to Sit     Supine to sit: Min guard;HOB elevated Sit to supine: Min guard   General bed mobility comments: CGA for safety with increased time required    Transfers Overall transfer level: Needs assistance Equipment used: Rolling walker (2 wheeled) Transfers: Sit to/from Stand Sit to Stand: Min assist         General transfer comment: increased time, cues for safe hand  placement/sequence  Ambulation/Gait Ambulation/Gait assistance: Min assist Gait Distance (Feet): 8 Feet Assistive device: Rolling walker (2 wheeled) Gait Pattern/deviations: Step-to pattern Gait velocity: decreased   General Gait Details: Pt fatigues extremely quickly with HR elevation to 140 with mminimal activity.      Balance Overall balance assessment: Needs assistance Sitting-balance support: No upper extremity supported;Feet unsupported Sitting balance-Leahy Scale: Fair     Standing balance support: During functional activity;Bilateral upper extremity supported Standing balance-Leahy Scale: Fair Standing balance comment: MIN A for steadying during LB bathing      Cognition Arousal/Alertness: Awake/alert Behavior During Therapy: WFL for tasks assessed/performed Overall Cognitive Status: Within Functional Limits for tasks assessed      General Comments: Pt is A and O x 4      Exercises General Exercises - Upper Extremity Shoulder Flexion: AROM;Strengthening;Both;10 reps;Seated Shoulder Horizontal ABduction: AROM;Strengthening;Both;10 reps;Seated Elbow Flexion: AROM;Strengthening;Both;10 reps;Seated Chair Push Up: AROM;Both;10 reps;Seated Other Exercises Other Exercises: x10 reps of scapular retraction        Pertinent Vitals/Pain Pain Assessment: No/denies pain           PT Goals (current goals can now be found in the care plan section) Acute Rehab PT Goals Patient Stated Goal: I want to return to work Progress towards PT goals: Progressing toward goals    Frequency    7X/week      PT Plan Current plan remains appropriate    Co-evaluation     PT goals addressed during session: Mobility/safety with mobility;Balance;Proper use of DME;Strengthening/ROM  AM-PAC PT "6 Clicks" Mobility   Outcome Measure  Help needed turning from your back to your side while in a flat bed without using bedrails?: A Little Help needed moving from lying on  your back to sitting on the side of a flat bed without using bedrails?: A Little Help needed moving to and from a bed to a chair (including a wheelchair)?: A Little Help needed standing up from a chair using your arms (e.g., wheelchair or bedside chair)?: A Little Help needed to walk in hospital room?: A Lot Help needed climbing 3-5 steps with a railing? : A Lot 6 Click Score: 16    End of Session Equipment Utilized During Treatment: Gait belt Activity Tolerance: Patient limited by fatigue Patient left: in chair;with call bell/phone within reach;with chair alarm set Nurse Communication: Mobility status PT Visit Diagnosis: Difficulty in walking, not elsewhere classified (R26.2);Muscle weakness (generalized) (M62.81);Unsteadiness on feet (R26.81)     Time: 1601-0932 PT Time Calculation (min) (ACUTE ONLY): 24 min  Charges:  $Gait Training: 8-22 mins $Therapeutic Activity: 8-22 mins                     Julaine Fusi PTA 09/28/20, 1:23 PM

## 2020-09-28 NOTE — Consult Note (Signed)
Physical Medicine and Rehabilitation Consult Reason for Consult: acute exacerbation of chronic obstructive pulmonary disease Referring Physician: Domenic Polite, MD   HPI: Kathryn Garrett is a 75 y.o. female with a history of CAD with VF arrest s/p PCI to the PCA in 2017 s/p PTCA to the RCA for ISR in 2018, chronic combined systolic and diastolic CHF, COPD with ongoing tobacco use, HTN, HLD, anemia, and narcotic abuse on suboxone therapy admitted with acute hypoxic respiratory failure requiring mechanical ventilation (extubated 09/17/20) secondary to COPD exacerbation and Klebsiella PNA and acute on chronic combined CHF with transient ST elevation and Echo demonstrating new diastolic LV dysfunction. Physical Medicine & Rehabilitation was consulted to assess candidacy for CIR.    Review of Systems  Constitutional: Positive for malaise/fatigue.  HENT: Negative.   Eyes: Negative.   Respiratory: Negative.   Cardiovascular: Negative.   Gastrointestinal: Negative.   Genitourinary: Negative.   Musculoskeletal: Negative.   Skin: Negative.   Neurological: Positive for weakness.  Endo/Heme/Allergies: Negative.   Psychiatric/Behavioral: Negative.    Past Medical History:  Diagnosis Date   Anemia, mild    Arthritis    "qwhere" (02/10/2016)   CAD (coronary artery disease)    a. VF arrest 01/2009/CAD with inferoposterior MI s/p aspiration thrombectomy/BMS of RCA at that time. b. ISR of BMS s/p DES to RCA 06/2010 with moderate LM/LAD disease not significant by FFR. c. s/p angiosculpt PTCA to prox RCA 01/2016 for ISR. d. Aggressive PTCA to prox RCA for ISR 05/2016.   Cardiac arrest ([place]) 01/2009   a. in setting of inf-post STEMI 01/2009 (VF).   Chest pain 06/04/2016   Chronic bronchitis (HCC)    Chronic combined systolic and diastolic CHF (congestive heart failure) ([place])    a. EF 40-45% by cath 2010. b. 60-65% with grade 1 DD by echo 09/2015   COPD (chronic obstructive pulmonary disease) ([place])     Hyperlipidemia 01/28/2016   Hyperlipidemia LDL goal <70 01/28/2016   Hypertension    Lung mass    MI (myocardial infarction) ([place]) 01/2009   Narcotic abuse ([place])    pt now taking Suboxone tid   Pleural effusion on left 09/24/2015   Pneumonia 06/2014; 09/2015   Pre-diabetes    Pulmonary nodule    PVD (peripheral vascular disease) (HCC)    mild atherosclerosis of infrarenal aorta, 25% ostial left renal artery stenosis, 50% ostial right common iliac by cath 2010   S/P PTCA (percutaneous transluminal coronary angioplasty) 02/10/16 to RCA lesion for in stent restenois 02/11/2016   Subclavian artery stenosis (HCC)    a. >50% by duplex 05/2016.   Tobacco abuse    Unstable angina [facility])    Past Surgical History:  Procedure Laterality Date   ABDOMINAL HYSTERECTOMY     ANKLE FRACTURE SURGERY Right 2000s   CARDIAC CATHETERIZATION N/A 02/10/2016   Procedure: Left Heart Cath and Coronary Angiography;  Surgeon: Jettie Booze, MD;  Location: [place] [facility];  Service: Cardiovascular;  Laterality: N/A;   CARDIAC CATHETERIZATION N/A 02/10/2016   Procedure: Coronary Balloon Angioplasty;  Surgeon: Jettie Booze, MD;  Location: [place] [facility];  Service: Cardiovascular;  Laterality: N/A;  instent RCA   CARDIAC CATHETERIZATION N/A 06/05/2016   Procedure: Left Heart Cath and Coronary Angiography;  Surgeon: Leonie Man, MD;  Location: [place] [facility];  Service: Cardiovascular;  Laterality: N/A;   CARDIAC CATHETERIZATION N/A 06/05/2016   Procedure: Coronary Balloon Angioplasty;  Surgeon: Leonie Man, MD;  Location: [place] [facility];  Service: Cardiovascular;  Laterality: N/A;   CHEST TUBE INSERTION N/A 10/08/2015   Procedure: PLEURX CATH REMOVAL;  Surgeon: [name], MD;  Location: [facility];  Service: Thoracic;  Laterality: N/A;   CORONARY ANGIOPLASTY WITH STENT PLACEMENT  01/2009; 2012   FRACTURE SURGERY     RIGHT/LEFT HEART CATH AND CORONARY ANGIOGRAPHY N/A 09/24/2020   Procedure:  RIGHT/LEFT HEART CATH AND CORONARY ANGIOGRAPHY;  Surgeon: Nelva Bush, MD;  Location: [place] [facility];  Service: Cardiovascular;  Laterality: N/A;   VIDEO ASSISTED THORACOSCOPY (VATS)/THOROCOTOMY Left 09/29/2015   Procedure: PREOP BRONCHOSCOPY, LEFT THORACOSCOPY, POSSIBLE THORACOTOMY, PLEURAL BIOPSY, TALC;  Surgeon: [name], MD;  Location: [facility];  Service: General;  Laterality: Left;   Family History  Problem Relation Age of Onset   Coronary artery disease Father    Hyperlipidemia Father    Early death Father    Heart attack Father    Coronary artery disease Mother    Hyperlipidemia Mother    Heart attack Mother    Cancer Brother    Social History:  reports that she has been smoking cigarettes. She has a 28.00 pack-year smoking history. She has never used smokeless tobacco. She reports that she does not drink alcohol and does not use drugs. Allergies:  Allergies  Allergen Reactions   Azithromycin Rash   Medications Prior to Admission  Medication Sig Dispense Refill   acetaminophen (TYLENOL) 500 MG tablet Take 1,000 mg by mouth every 6 (six) hours as needed for headache.     albuterol (PROVENTIL HFA;VENTOLIN HFA) 108 (90 Base) MCG/ACT inhaler Inhale 2 puffs into the lungs every 4 (four) hours as needed for wheezing or shortness of breath.     amoxicillin (AMOXIL) 500 MG tablet Take 500 mg by mouth 3 (three) times daily.     aspirin EC 81 MG tablet Take 81 mg by mouth at bedtime.      azelastine (ASTELIN) 0.1 % nasal spray USE 2 SPRAYS BID UNTIL DIRECTED TO STOP. USE FOR RUNNY NOSE  0   Buprenorphine HCl-Naloxone HCl 8-2 MG FILM Place 0.5 Film under the tongue daily.     cetirizine (ZYRTEC) 10 MG tablet Take 1 tablet (10 mg total) by mouth daily. 30 tablet 0   dexamethasone (DECADRON) 6 MG tablet dexamethasone 6 mg tablet  TAKE 1 TABLET BY MOUTH EVERY DAY     doxycycline (VIBRAMYCIN) 100 MG capsule doxycycline hyclate 100 mg capsule  TAKE 1 CAPSULE BY MOUTH TWICE DAILY      fluticasone (FLONASE) 50 MCG/ACT nasal spray Place 1 spray into both nostrils daily.     furosemide (LASIX) 40 MG tablet Take 1 tablet (40 mg total) by mouth daily as needed (swelling). 90 tablet 3   ibuprofen (ADVIL,MOTRIN) 600 MG tablet Take 600 mg by mouth as needed for pain.  0   levothyroxine (SYNTHROID) 88 MCG tablet Take 88 mcg by mouth every morning.     mupirocin ointment (BACTROBAN) 2 % mupirocin 2 % topical ointment  APPLY TOPICALLY TO LOWER LEG THREE TIMES DAILY FOR 10 DAYS     nystatin cream (MYCOSTATIN) Apply topically 2 (two) times daily.     potassium chloride SA (KLOR-CON) 20 MEQ tablet Take 1 tablet (20 mEq total) by mouth daily as needed (Take with furosemide (lasix)). 90 tablet 3   predniSONE (DELTASONE) 20 MG tablet prednisone 20 mg tablet     TRELEGY ELLIPTA 100-62.5-25 MCG/INH AEPB Take 1 puff by mouth daily.  triamcinolone cream (KENALOG) 0.1 % Apply topically 2 (two) times daily. to affected area     nitroGLYCERIN (NITROSTAT) 0.4 MG SL tablet Place 1 tablet (0.4 mg total) under the tongue every 5 (five) minutes as needed for chest pain. 25 tablet 4    Home: Home Living Family/patient expects to be discharged to:: Private residence Living Arrangements: Alone Available Help at Discharge:  (to stay with a daughter in [place] at d/c) Type of Home: Apartment Home Access: Stairs to enter [employer] of Steps: flight Entrance Stairs-Rails: Right,Left Home Layout: One level Bathroom Shower/Tub: Optometrist: Yes Home Equipment: None  Lives With: Alone  Functional History: Prior Function Level of Independence: Independent Comments: driving, working as Theme park manager, 1 fall in past 6 months (tripped over cords in bathroom), active Functional Status:  Mobility: [place] bed mobility: Needs Assistance Bed Mobility: Supine to Sit Supine to sit: Min guard,HOB elevated Sit to supine: Min  guard General bed mobility comments: able to get to EOB and back to bed on her own today with rail for assist Transfers Overall transfer level: Needs assistance Equipment used: Rolling walker (2 wheeled) Transfers: Sit to/from [employer] to Stand: Min assist Stand pivot transfers: Min assist,Mod assist General transfer comment: increased time, cues for safe hand placement/sequence Ambulation/Gait Ambulation/Gait assistance: Min assist Gait Distance (Feet): 4 Feet Assistive device: Rolling walker (2 wheeled) Gait Pattern/deviations: Step-to pattern General Gait Details: seemed more fatigued today only able to tolerate short standing time with walker. Gait velocity: decreased    ADL: ADL Overall ADL's : Needs assistance/impaired Eating/Feeding: Minimal assistance,Set up,Bed level Grooming: Sitting,Wash/dry face,Oral care,Set up,Supervision/safety Grooming Details (indicate cue type and reason): Pt tolerated sitting EOB for grooming tasks with set up and supervision for safety Upper Body Dressing : Minimal assistance,Sitting Upper Body Dressing Details (indicate cue type and reason): to don gown to back side like robe Lower Body Dressing: Moderate assistance,Sitting/lateral leans Lower Body Dressing Details (indicate cue type and reason): to don socks General ADL Comments: MAX A hand over hand for UB ADLs bed level, unable to raise UEs against gravity enough in sitting to attempt an UB ADLs and requires UEs for seated support. TOTAL A for LB ADLs bed level.  Cognition: Cognition Overall Cognitive Status: Within Functional Limits for tasks assessed Orientation Level: Oriented X4 Cognition Arousal/Alertness: Awake/alert Behavior During Therapy: WFL for tasks assessed/performed Overall Cognitive Status: Within Functional Limits for tasks assessed Area of Impairment: Attention,Memory,Safety/judgement,Awareness,Following commands,Problem solving Orientation Level:  Disoriented to,Time Memory: Decreased short-term memory,Decreased recall of precautions Following Commands: Follows one step commands consistently,Follows multi-step commands with increased time Safety/Judgement: Decreased awareness of safety,Decreased awareness of deficits Awareness: Intellectual Problem Solving: Slow processing,Decreased initiation,Requires tactile cues,Requires verbal cues General Comments: alert and oriented, despite fatigue from multiple providers and MBSS earlier today, agreeable and motivated to participate  Blood pressure 90/61, pulse 91, temperature 98 F (36.7 C), resp. rate 17, height 4\' 11"  (1.499 m), weight 57.4 kg, SpO2 96 %. Physical Exam  Gen: no distress, ill appearing HEENT: oral mucosa pink and moist, NCAT Cardio: Reg rate Chest: normal effort, normal rate of breathing Abd: soft, non-distended Ext: no edema Psych: pleasant, normal affect Skin: intact, IV in place Neuro: Alert and oriented x3 Musculoskeletal:4/5 strength throughout  Results for orders placed or performed during the hospital encounter of 09/10/20 (from the past 24 hour(s))  Glucose, capillary     Status: Abnormal   Collection Time: 09/27/20 11:54 AM  Result Value Ref Range   Glucose-Capillary 141 (H) 70 - 99 mg/dL  Glucose, capillary     Status: Abnormal   Collection Time: 09/27/20  3:36 PM  Result Value Ref Range   Glucose-Capillary 116 (H) 70 - 99 mg/dL  Glucose, capillary     Status: Abnormal   Collection Time: 09/27/20  8:32 PM  Result Value Ref Range   Glucose-Capillary 118 (H) 70 - 99 mg/dL   Comment 1 Document in Chart   Glucose, capillary     Status: Abnormal   Collection Time: 09/28/20 12:53 AM  Result Value Ref Range   Glucose-Capillary 117 (H) 70 - 99 mg/dL  CBC with Differential/Platelet     Status: Abnormal   Collection Time: 09/28/20  4:34 AM  Result Value Ref Range   WBC 10.8 (H) 4.0 - 10.5 K/uL   RBC 4.00 3.87 - 5.11 MIL/uL   Hemoglobin 11.9 (L) 12.0 -  15.0 g/dL   HCT 38.3 36.0 - 46.0 %   MCV 95.8 80.0 - 100.0 fL   MCH 29.8 26.0 - 34.0 pg   MCHC 31.1 30.0 - 36.0 g/dL   RDW 14.7 11.5 - 15.5 %   Platelets 339 150 - 400 K/uL   nRBC 0.0 0.0 - 0.2 %   Neutrophils Relative % 53 %   Neutro Abs 5.8 1.7 - 7.7 K/uL   Lymphocytes Relative 37 %   Lymphs Abs 4.0 0.7 - 4.0 K/uL   Monocytes Relative 7 %   Monocytes Absolute 0.8 0.1 - 1.0 K/uL   Eosinophils Relative 2 %   Eosinophils Absolute 0.2 0.0 - 0.5 K/uL   Basophils Relative 1 %   Basophils Absolute 0.1 0.0 - 0.1 K/uL   Immature Granulocytes 0 %   Abs Immature Granulocytes 0.03 0.00 - 0.07 K/uL  Glucose, capillary     Status: Abnormal   Collection Time: 09/28/20  4:43 AM  Result Value Ref Range   Glucose-Capillary 117 (H) 70 - 99 mg/dL  Glucose, capillary     Status: Abnormal   Collection Time: 09/28/20  8:02 AM  Result Value Ref Range   Glucose-Capillary 115 (H) 70 - 99 mg/dL   No results found.   Assessment/Plan: Diagnosis: Cardiopulmonary debility -Unfortunately patient was denied CIR by her insurance though she is an excellent candidate. Discussed with patient her insurance company's decision, her current functional status, and the best options for her moving forward. She is currently having difficulty with standing due to her weakness. Her daughters both work full time so will be unable to care for her during the day. Educated her regarding FMLA in case this is an option for her daughters. Encouraged her to do her best with therapy to improve her strength and independence. Encouraged choosing high protein foods to maintain muscle mass. Discussed that SNF is likely best option for her temporarily until she can achieve modI given her limited family support. Patient confirms that she has a good PCP who will take over her care upon discharge from hospital/SNF. She denies any pain or other complaints.   Thank you for this consult. Leeroy Cha, MD

## 2020-09-28 NOTE — Progress Notes (Signed)
Speech Language Pathology Treatment: Dysphagia (education)  Patient Details Name: Kathryn Garrett MRN: [id number] DOB: 1945-12-06 Today's Date: 09/28/2020 Time: 0102-7253 SLP Time Calculation (min) (ACUTE ONLY): 35 min  Assessment / Plan / Recommendation Clinical Impression  Pt seen for ongoing assessment of swallowing and toleration of diet; education re: swallowing, Esophageal dysmotility and impact on swallowing, as well as the results of the MBSS yesterday indicating primary Pharyngeal phase Dysphagia w/ Esophageal phase Dysmotility. Further education was given on diet consistency including need for HONEY consistency liquids and swallowing strategies rec'd; general aspiration and REFLUX precautions. Family was not present in room.   Discussed w/ pt the results of the MBSS indicating primary pharyngeal phase dysphagia, especially the delayed pharyngeal swallow initiation and reduced airway closure resulting in SILENT aspiration of thin and Nectar liquids. Min pharyngeal phase dysmotility resulting pharyngeal residue requiring a f/u, Dry swallow to clear residue. Discussed the use of swallowing strategies to aid in reducing the pharyngeal residue but also the risk for aspiration: Chin Tuck and f/u Dry swallow w/ each bite/sip. Discussed the delayed pharyngeal swallow initiation during trials of thin and Nectar liquids and the SILENT laryngeal penetration/aspiration noted during the study -- significance of SILENT nature of the penetration/aspiration and risk for Pulmonary decline. Pt also appeared to present w/ Esophageal dysmotility; she has a h/o REFLUX and c/o "some" Esophageal dysmotility/discomfort especially "acid reflux" for which she takes TUMS at home. ANY Esophageal phase deficits and/or GERD retrograde activity can impact the oropharyngeal phases of swallowing (such as impact of LPR) which can then impact the timing of the pharyngeal swallow thus increase risk for (silent) aspiration to  occur. ANY Reflux material or retrograde activity can have an impact and increase presentation of a "cough" during/post meals. Recommend pt f/u w/ GI for further assessment/management, and Education.  Discussed and Practiced general aspiration precautions during oral intake of Honey consistency liquid to include Small sips Slowly, No straws, and Sit fully Upright w/ all oral intake. Strategies of f/u, Dry swallow and Chin Tuck practiced w/ pt -- pt exhibited adequate follow through w/ strategies w/ min verbal cues. Recommended reducing distractions including Talking at meals.   Handouts on diet consistency recommended as well as aspiration precautions and strategies posted in Room and given to pt; Handouts on both Reflux precautions and Esophageal motility given. Thorough education w/ pt on aspiration precautions; swallowing strategies; diet consistency including foods/liquids; thickened liquids(Honey consistency) and preparation/ordering/supplies. Also discussed REFLUX precautions and Esophageal dysmotility as this was a Baseline c/o pt's prior to admit. Again, encouraged pt to have pt f/u w/ GI for formal assessment/management and Education.   Recommend continue a Dysphagia level 3 (minced meats, moistened mech soft foods) diet w/ Honey consistency liquids Via Cup; chin tuck; f/u, Dry swallow; aspiration precautions; REFLUX precautions; Pill in Puree for safer swallowing. ST services to f/u at discharge at next venue of care/REHAB w/ ongoing dysphagia tx and f/u MBSS in ~2-4 weeks, hopefully post GI consultation and f/u. Pt agreed; NSG agreed.     HPI HPI: Pt is a 75 yo female admitted for COPD exacerbation and left lower lobe pneumonia. Tracheal aspirate from 4/30 with KLEBSIELLA PNEUMONIAE (resistant to Ampicillin). Head CT 09/17/2020 No acute intracranial abnormality.Past medical history including HRrEF, tobacco abuse, CAD s/p stenting, CHF, COPD who is admitted with COPD exacerbation / pneumonia /  decompensated heart failure / NSTEMI.  Pt intubated 09/11/2020 thru 09/17/2020. Extubated then w/ NG placed for meds, po's.  MBSS completed on 09/27/2020  revealing pharyngeal phase dysphagia w/ SILENT Aspiration; also noted Esophageal phase Dysmotility w/ bolus stasis and retrograde backflow b/t trials.      SLP Plan  Continue with current plan of care       Recommendations  Diet recommendations: Dysphagia 3 (mechanical soft);Honey-thick liquid (meats Minced w/ gravies for ease of mastication) Liquids provided via: Cup;No straw Medication Administration: Whole meds with puree (vs need to Crush) Supervision: Patient able to self feed;Staff to assist with self feeding;Intermittent supervision to cue for compensatory strategies Compensations: Minimize environmental distractions;Slow rate;Small sips/bites;Lingual sweep for clearance of pocketing;Multiple dry swallows after each bite/sip;Follow solids with liquid;Clear throat intermittently;Chin tuck Postural Changes and/or Swallow Maneuvers: Out of bed for meals;Seated upright 90 degrees;Upright 30-60 min after meal                General recommendations:  (Palliative Care following for [place]; Dietician f/u) Oral Care Recommendations: Oral care BID;Oral care before and after PO;Staff/trained caregiver to provide oral care Follow up Recommendations: Skilled Nursing facility (denied by CIR today) SLP Visit Diagnosis: Dysphagia, pharyngeal phase (R13.13);Dysphagia, pharyngoesophageal phase (R13.14) Plan: Continue with current plan of care       GO                 [name], [place], CCC-SLP Speech Language Pathologist Rehab Services [phone] [facility] 09/28/2020, 2:39 PM

## 2020-09-28 NOTE — TOC Progression Note (Signed)
Transition of Care [facility]) - Progression Note    Patient Details  Name: Kathryn Garrett MRN: [id number] Date of Birth: Apr 12, 1946  Transition of Care [facility]) CM/SW [place], LCSW Phone Number: 09/28/2020, 12:29 PM  Clinical Narrative:   CSW spoke with pt's daughter Amy and at this time they would like to try to get pt into a SNF closer to them (right [place]ide of [place], [place] area). Pt's daughter suggested [facility] in [place]. CSW spoke with admissions at [facility] and they have a wait list right now. The admission coordinator suggested the [facility] in [address] knows they have beds. CSW will reach back out to daughter.      Barriers to Discharge: Continued Medical Work up  Expected Discharge Plan and Services   In-house Referral: Clinical Social Work   Post Acute Care Choice:  [facility]) Living arrangements for the past 2 months: Single Family Home                                       Social Determinants of Health (SDOH) Interventions    Readmission Risk Interventions No flowsheet data found.

## 2020-09-29 DIAGNOSIS — Z515 Encounter for palliative care: Secondary | ICD-10-CM | POA: Diagnosis not present

## 2020-09-29 DIAGNOSIS — J441 Chronic obstructive pulmonary disease with (acute) exacerbation: Secondary | ICD-10-CM | POA: Diagnosis not present

## 2020-09-29 DIAGNOSIS — Z7189 Other specified counseling: Secondary | ICD-10-CM | POA: Diagnosis not present

## 2020-09-29 LAB — GLUCOSE, CAPILLARY
Glucose-Capillary: 99 mg/dL (ref 70–99)
Glucose-Capillary: 95 mg/dL (ref 70–99)
Glucose-Capillary: 147 mg/dL — ABNORMAL HIGH (ref 70–99)
Glucose-Capillary: 79 mg/dL (ref 70–99)
Glucose-Capillary: 130 mg/dL — ABNORMAL HIGH (ref 70–99)
Glucose-Capillary: 121 mg/dL — ABNORMAL HIGH (ref 70–99)

## 2020-09-29 LAB — CBC WITH DIFFERENTIAL/PLATELET
Abs Immature Granulocytes: 0.05 10*3/uL (ref 0.00–0.07)
Basophils Absolute: 0.1 10*3/uL (ref 0.0–0.1)
Basophils Relative: 1 %
Eosinophils Absolute: 0.3 10*3/uL (ref 0.0–0.5)
Eosinophils Relative: 2 %
HCT: 38.2 % (ref 36.0–46.0)
Hemoglobin: 12.1 g/dL (ref 12.0–15.0)
Immature Granulocytes: 0 %
Lymphocytes Relative: 29 %
Lymphs Abs: 3.6 10*3/uL (ref 0.7–4.0)
MCH: 30.5 pg (ref 26.0–34.0)
MCHC: 31.7 g/dL (ref 30.0–36.0)
MCV: 96.2 fL (ref 80.0–100.0)
Monocytes Absolute: 1 10*3/uL (ref 0.1–1.0)
Monocytes Relative: 8 %
Neutro Abs: 7.3 10*3/uL (ref 1.7–7.7)
Neutrophils Relative %: 60 %
Platelets: 372 10*3/uL (ref 150–400)
RBC: 3.97 MIL/uL (ref 3.87–5.11)
RDW: 14.6 % (ref 11.5–15.5)
WBC: 12.2 10*3/uL — ABNORMAL HIGH (ref 4.0–10.5)
nRBC: 0 % (ref 0.0–0.2)

## 2020-09-29 NOTE — Progress Notes (Signed)
NAME:  DALEY MOORADIAN, MRN:  [id number], DOB:  23-Jan-1946, LOS: 88 ADMISSION DATE:  09/10/2020, CONSULTATION DATE: 09/11/2020 REFERRING MD: Dr. Damita Dunnings, CHIEF COMPLAINT: Shortness of breath  History of Present Illness:  75 year old female presented to the [facility] ED from home with complaints of shortness of breath.  Patient reported 2 to 3 days of progressive shortness of breath and associated new onset nonproductive cough as well as a subjective fever.  Per ED documentation she denied associated chest pain/palpitations/diaphoresis.  And denies any recent nausea/vomiting, chills/rigors/myalgias.  Patient confirmed a history of COPD and current smoking history stating she is not on any supplemental oxygen at baseline.  ED course: Patient received cefepime due to concerns for a left lower lobe infiltrate, 500 mL NS bolus & IV fluids at 150 mL an hour Initial vitals: Afebrile at 98, tachypneic at 22, tachycardic at 108, BP 127/72 & 99% on BiPAP Significant labs: Serum bicarb 15, hyperglycemic at 350, troponin 70 > 745 > 1838, BNP 222.6, VBG revealed metabolic acidosis: 0.93/23/557/32.2, leukocytosis at 40.2, lactic acidosis 2.5> 2.2 > 2.5  TRH hospitalist were consulted for admission.  Around midnight PCCM was called due to patient's deteriorating respiratory status.  Per ED provider Dr. Karma Greaser and care RN the patient complained that " I can't breathe", was given a dose of Ativan and became somnolent on BiPAP.  Coarse crackles auscultated bilaterally, and the decision was made to emergently intubate the patient and place her on mechanical ventilation. PCCM consulted for management  Significant Hospital Events: Including procedures, antibiotic start and stop dates in addition to other pertinent events   . 09/10/2020-patient admits admitted with hospitalist service for COPD exacerbation and left lower lobe pneumonia . 09/11/2020-overnight patient had suspected flash pulmonary edema, dyspnea followed by  somnolence requiring emergent intubation and mechanical ventilation.  Admitted to ICU . 09/12/20- patient failed SBT , she had blood tinged secretions fromETT and aggitation, family was at bedside and we agreed on giving her more time and try again.  . 09/14/20- Failed SBT (became hypoxic with sats mid 80's, increased WOB and assessory muscle use, HR 140-160's). Will diurese today and add Metoprolol.  Tracheal aspirate from 4/30 with KLEBSIELLA PNEUMONIAE (resistant to Ampicillin)   . 09/15/20- Failed SBT (increased WOB and assessory muscle use, RR 40's, HR 140-150's, Hypertensive); plan to add scheduled PO Klonopin and Oxycodone, Lasix 40 mg IV x1, consult Palliative Care . 09/16/20- Worsening Leukocytosis up to 21.7, low grade fever overnight, increased secretions overnight, will repeat Tracheal aspirate, remove central line, add mucinex, plan for SBT . 09/17/20-[name] passed SBT in AM and following commands still having some moderate secretions . 09/18/20-  Extubated yesterday.  Failed swallow eval today.  . 09/20/20- Weaned to room air, Leukocytosis improving; Hemodynamically stable . 09/27/20- patient is coughing up phlegm but clinically is improved, we discussed pneumonia and COPD with outpatient clinic follow up with pulmonology.  There is plan for post dc rehab . 09/28/20- patient is improved and she is being optimized for CIR, pulmonary will sign off and can follow up on outpatient basis . 09/29/20- family at bedside today.  We discussed barium swallow with aspiration.   Interim History / Subjective:  No acute events reported overnight Afebrile, Hemodynamically stable, no Vasopressors Weaned to room air Urine output 900 cc over past 24 hrs (+1.3 L since admit), Creatinine remains stable with diuresis Leukocytosis continues to improve    Objective   Blood pressure (!) 89/46, pulse 93, temperature 98.5 F (36.9 C),  temperature source Oral, resp. rate 18, height 4\' 11"  (1.499 m), weight 57.6 kg, SpO2 97  %.        Intake/Output Summary (Last 24 hours) at 09/29/2020 1704 Last data filed at 09/29/2020 1103 Gross per 24 hour  Intake 460 ml  Output 275 ml  Net 185 ml   Filed Weights   09/27/20 0500 09/28/20 0428 09/29/20 0700  Weight: 57.6 kg 57.4 kg 57.6 kg    Examination: General: Acute on chronically ill-appearing female, laying in bed, breathing treatment and chest PT via bed currently in place, in no acute distress HEENT: Atraumatic, normocephalic, neck supple, no JVD Neuro: Awake, alert and oriented x3, moves all extremities to command (generalized weakness), no focal deficits, speech clear CV: Tachycardia, regular rate and rhythm with occasional PACs, S1-S2, no murmurs, rubs, gallops, 2+ distal pulses Pulm: Coarse breath sounds bilaterally, no wheezing or rales noted, even, nonlabored GI: Soft, nontender, nondistended, no guarding or rebound tenderness, bowel sounds positive x4 Skin: Limited exam-warm and dry.  No obvious rashes, lesions, ulcerations Extremities: Warm and dry.  No deformities, minimal edema   Labs/imaging that I have personally reviewed  (right click and "Reselect all SmartList Selections" daily)  Labs 09/20/2020: WBC 20.1, K 3.0, glucose 124, BUN 68, Cr. 0.66 Chest x-ray 09/16/2020>>Endotracheal tube, NG tube, right IJ line in stable position. Heart size normal. Persistent but improved bilateral interstitial infiltrates. Tiny left pleural effusion cannot be excluded. No pneumothorax. MRI brain 09/13/2020:No acute brain finding. Mild chronic small-vessel ischemic change of the white matter, fairly typical for age.  Assessment & Plan:   Acute hypoxic respiratory failure secondary to Acute COPD Exacerbation & left lung multifocal pneumonia -S/p Extubation -Supplemental O2 as needed to maintain O2 sats 88 to 94% -Follow intermittent CXR & ABG as needed -Bronchodilators -Continue Budesonide nebs -Completed 7 days course of Cefepime/Ancef -Aggressive pulmonary  toilet  Sepsis due to left lung multifocal pneumonia>> tracheal aspirate on 4/30 with KLEBSIELLA PNEUMONIAE (resistant to Ampicillin)>>treated -Monitor fever curve -Trend WBCs.  Elevated WBC in part related to steroids. -Follow cultures as above -Completed 7 days of Cefepime/Ancef  Acute systolic congestive heart failure Acute non-ST elevation MI New diagnosis of A. fib with RVR -Continuous cardiac monitoring -Patient's ejection fraction on echocardiogram is 30% -Diuresis as renal function and BP permits >> currently on 40 mg IV Lasix daily and will continue -Cardiology following, input is appreciated -Milrinone has been tapered off -Patient completed therapy with IV heparin -Continue aspirin Plavix and statin -She will need left heart cath once stable -Metoprolol BID per tube  Acute metabolic/toxic encephalopathy>>improving -Improved now that she is extubated and off continuous sedation -stop scheduled Klonopin as unable to take p.o. -Was on buprenorphine/naloxone as an outpatient which is now held ~ continue scheduled Oxycodone per tube -Provide supportive care -CT Head at admission negative -MRI Brain 09/13/20 negative -Continue Aspirin, Plavix, Atorvastatin   Best practice (right click and "Reselect all SmartList Selections" daily)  Diet:  NPO.  Repeat swallow evaluation to be performed on 09/20/2020 Pain/Anxiety/Delirium protocol (if indicated): Oxycodone VAP protocol (if indicated): Now extubated. DVT prophylaxis: SCD (chemical prophylaxis held due to previous hemoptysis) GI prophylaxis: H2B Glucose control:  SSI Yes Central venous access: n/a Arterial line:  N/A Foley: N/A Mobility: Out of bed with assistance as tolerated PT consulted: needs Pt/OT Code Status:  full code Disposition: Stepdown   Labs   CBC: Recent Labs  Lab 09/25/20 0328 09/26/20 0404 09/27/20 0457 09/28/20 0434 09/29/20 0442  WBC 17.8*  17.8* 16.0* 10.8* 12.2*  NEUTROABS 11.4* 12.0* 11.1*  5.8 7.3  HGB 12.9 13.9 13.3 11.9* 12.1  HCT 40.4 44.3 40.8 38.3 38.2  MCV 95.7 95.9 94.7 95.8 96.2  PLT 327 336 346 339 063    Basic Metabolic Panel: Recent Labs  Lab 09/23/20 0426 09/24/20 0430 09/25/20 0328 09/26/20 0328 09/27/20 0457  NA 140 138 137 135 136  K 3.9 3.6 3.7 3.8 3.5  CL 107 108 106 105 105  CO2 23 21* 23 20* 22  GLUCOSE 98 102* 87 103* 117*  BUN 44* 36* 31* 32* 30*  CREATININE 0.51 0.43* 0.53 0.52 0.48  CALCIUM 8.7* 8.6* 9.0 8.8* 8.8*   GFR: Estimated Creatinine Clearance: 47 mL/min (by C-G formula based on SCr of 0.48 mg/dL). Recent Labs  Lab 09/26/20 0404 09/27/20 0457 09/28/20 0434 09/29/20 0442  WBC 17.8* 16.0* 10.8* 12.2*    Liver Function Tests: No results for input(s): AST, ALT, ALKPHOS, BILITOT, PROT, ALBUMIN in the last 168 hours. No results for input(s): LIPASE, AMYLASE in the last 168 hours. No results for input(s): AMMONIA in the last 168 hours.  ABG    Component Value Date/Time   PHART 7.38 09/15/2020 0500   PCO2ART 41 09/15/2020 0500   PO2ART 123 (H) 09/15/2020 0500   HCO3 33.5 (H) 09/17/2020 0944   TCO2 23 01/14/2009 0327   ACIDBASEDEF 0.8 09/15/2020 0500   O2SAT 88.8 09/17/2020 0944     Coagulation Profile: No results for input(s): INR, PROTIME in the last 168 hours.  Cardiac Enzymes: No results for input(s): CKTOTAL, CKMB, CKMBINDEX, TROPONINI in the last 168 hours.  HbA1C: Hgb A1c MFr Bld  Date/Time Value Ref Range Status  09/11/2020 04:37 AM 6.4 (H) 4.8 - 5.6 % Final    Comment:    (NOTE) Pre diabetes:          5.7%-6.4%  Diabetes:              >6.4%  Glycemic control for   <7.0% adults with diabetes   10/02/2019 12:28 PM 6.4 (H) 4.8 - 5.6 % Final    Comment:             Prediabetes: 5.7 - 6.4          Diabetes: >6.4          Glycemic control for adults with diabetes: <7.0     CBG: Recent Labs  Lab 09/28/20 2337 09/29/20 0441 09/29/20 0808 09/29/20 1248 09/29/20 1619  GLUCAP 123* 99 95 147* 79       Ottie Glazier, M.D.  Pulmonary & [place]

## 2020-09-29 NOTE — Progress Notes (Signed)
Physical Therapy Treatment Patient Details Name: Kathryn Garrett MRN: [id number] DOB: Apr 04, 1946 Today's Date: 09/29/2020    History of Present Illness 75 y.o. female with history of CAD with VF arrest s/p PCI to the RCA in 2017 s/p PTCA to the RCA for ISR in 2018, chronic combined systolic and diastolic CHF, COPD with ongoing tobacco use, HTN, HLD, anemia, and narcotic abuse on Suboxone therapy admitted with acute hypoxic respiratory failure requiring mechanical ventilation (extubated 09/17/2020) secondary to COPD exacerbation Klebsiella PNA and acute on chronic combined CHF with transient ST elevation and echo demonstrating new LV dysfunction.    PT Comments    Pt was in recliner upon arriving. Agrees to PT session and is cooperative and pleasant. " I had a meeting yesterday and they were talking about a feeding tube. Do you think I should have a feeding tube?" Redirected conversation and discussed role of PT. Personally suggested that pt discuss with family however pt did not want to discuss yet. She did agree to perform ambulation however severely limited by activity tolerance. Resting BP sitting in chair 104/66 with pt asymptomatic. Resting HR 97 bpm. Stood to RW and ambulate 8 ft prior to pt requesting to sit quickly. HR elevated to 151 bpm.  Pt is SOB. Stood pivot back to recliner. RN aware. Pt will need extensive PT going forward. Denied CIR. Highly recommend DC to SNF. Acute PT will continue to follow per POC.    Follow Up Recommendations  SNF;Supervision/Assistance - 24 hour     Equipment Recommendations  None recommended by PT       Precautions / Restrictions Precautions Precautions: Fall Restrictions Weight Bearing Restrictions: No    Mobility  Bed Mobility      General bed mobility comments: Pt was in recliner pre/post session    Transfers Overall transfer level: Needs assistance Equipment used: Rolling walker (2 wheeled) Transfers: Sit to/from Stand Sit to Stand:  Min guard         General transfer comment: Pt was able to stand from recliner with CGA for safety.  Ambulation/Gait Ambulation/Gait assistance: Min guard Gait Distance (Feet): 8 Feet Assistive device: Rolling walker (2 wheeled) Gait Pattern/deviations: Step-through pattern Gait velocity: decreased   General Gait Details: Pt was able to ambulate 8 ft. HR elevated to 151 BPM from 97 BPM at rest.       Balance Overall balance assessment: Needs assistance Sitting-balance support: No upper extremity supported;Feet unsupported Sitting balance-Leahy Scale: Fair     Standing balance support: During functional activity;Bilateral upper extremity supported Standing balance-Leahy Scale: Fair       Cognition Arousal/Alertness: Awake/alert Behavior During Therapy: WFL for tasks assessed/performed Overall Cognitive Status: Within Functional Limits for tasks assessed      General Comments: Pt is A and O x 4. Reports feeling well.Resting HR 97 bpm.             Pertinent Vitals/Pain Pain Assessment: No/denies pain           PT Goals (current goals can now be found in the care plan section) Acute Rehab PT Goals Patient Stated Goal: I want to return to work Progress towards PT goals: Progressing toward goals    Frequency    7X/week      PT Plan Current plan remains appropriate    Co-evaluation     PT goals addressed during session: Mobility/safety with mobility;Balance;Proper use of DME;Strengthening/ROM        AM-PAC PT "6 Clicks" Mobility   Outcome  Measure  Help needed turning from your back to your side while in a flat bed without using bedrails?: A Little Help needed moving from lying on your back to sitting on the side of a flat bed without using bedrails?: A Little Help needed moving to and from a bed to a chair (including a wheelchair)?: A Little Help needed standing up from a chair using your arms (e.g., wheelchair or bedside chair)?: A Little Help  needed to walk in hospital room?: A Lot Help needed climbing 3-5 steps with a railing? : A Lot 6 Click Score: 16    End of Session Equipment Utilized During Treatment: Gait belt Activity Tolerance: Patient limited by fatigue Patient left: in chair;with call bell/phone within reach;with chair alarm set Nurse Communication: Mobility status PT Visit Diagnosis: Difficulty in walking, not elsewhere classified (R26.2);Muscle weakness (generalized) (M62.81);Unsteadiness on feet (R26.81)     Time: 7253-6644 PT Time Calculation (min) (ACUTE ONLY): 28 min  Charges:  $Therapeutic Activity: 23-37 mins                     Julaine Fusi PTA 09/29/20, 1:46 PM

## 2020-09-29 NOTE — NC FL2 (Deleted)
[place] LEVEL OF CARE SCREENING TOOL     IDENTIFICATION  Patient Name: Kathryn Garrett Birthdate: [birth year] Sex: female Admission Date (Current Location): 09/10/2020  [place] and [place] Number:  Engineering geologist and Address:  [facility], [address], [place], [place] [address]      Provider Number: [id number]  Attending Physician Name and Address:  Kathie Dike, MD  Relative Name and Phone Number:      [name] (Daughter)  [phone] [facility])   [place] Daughter   [phone]       Current Level of Care: Hospital Recommended Level of Care: [place] Prior Approval Number: [id number]  Date Approved/Denied:   PASRR Number:    Discharge Plan: SNF    Current Diagnoses: Patient Active Problem List   Diagnosis Date Noted  . Acute on chronic HFrEF (heart failure with reduced ejection fraction) ([place])   . Acute respiratory failure ([place]) 09/11/2020  . HFrEF (heart failure with reduced ejection fraction) ([place])   . Acute exacerbation of chronic obstructive pulmonary disease (COPD) ([place]) 09/10/2020  . Severe sepsis ([place]) 09/10/2020  . NSTEMI (non-ST elevated myocardial infarction) ([place]) 09/10/2020  . Hyperglycemia 09/10/2020  . SOB (shortness of breath) 09/10/2020  . Chronic obstructive pulmonary disease ([place]) 03/05/2018  . History of opioid abuse ([place]) 03/05/2018  . Nocturnal hypoxemia 10/30/2017  . Peripheral arterial disease ([place]) 08/30/2016  . Chest pain 06/04/2016  . S/P PTCA (percutaneous transluminal coronary angioplasty) 02/10/16 to RCA lesion for in stent restenois 02/11/2016  . Unstable angina ([place])   . Hyperlipidemia LDL goal <70 01/28/2016  . Presence of coronary angioplasty implant and graft 10/15/2015  . Tobacco abuse 10/15/2015  . CAP (community acquired pneumonia) 09/24/2015  . CAD (coronary artery disease) 09/24/2015  . HTN (hypertension) 09/24/2015  . Lung nodule,  solitary 03/31/2014  . Liver nodule 03/31/2014    Orientation RESPIRATION BLADDER Height & Weight     Self,Time,Situation,Place  Normal Incontinent Weight: 126 lb 15.8 oz (57.6 kg) Height:  4\' 11"  (149.9 cm)  BEHAVIORAL SYMPTOMS/MOOD NEUROLOGICAL BOWEL NUTRITION STATUS      Continent Diet (DYS 3 diet, honey thick liquids)  AMBULATORY STATUS COMMUNICATION OF NEEDS Skin   Extensive Assist Verbally Normal                       Personal Care Assistance Level of Assistance  Bathing,Dressing,Feeding Bathing Assistance: Maximum assistance Feeding assistance: Independent Dressing Assistance: Maximum assistance     Functional Limitations Info  Sight,Hearing,Speech Sight Info: Adequate Hearing Info: Adequate Speech Info: Adequate    SPECIAL CARE FACTORS FREQUENCY  PT (By licensed PT),OT (By licensed OT)     PT Frequency: 5x OT Frequency: 5x            Contractures Contractures Info: Not present    Additional Factors Info  Code Status,Allergies,Isolation Precautions Code Status Info: full code Allergies Info: Azithromycin     Isolation Precautions Info: MRSA     Current Medications (09/29/2020):  This is the current hospital active medication list Current Facility-Administered Medications  Medication Dose Route Frequency Provider Last Rate Last Admin  . 0.9 %  sodium chloride infusion  250 mL Intravenous Continuous Rust-Chester, Huel Cote, NP   Stopped at 09/24/20 1321  . acetaminophen (TYLENOL) tablet 650 mg  650 mg Oral Q6H PRN Ralene Muskrat B, MD   650 mg at 09/28/20 1337   Or  . acetaminophen (TYLENOL) suppository 650 mg  650 mg  Rectal Q6H PRN Ralene Muskrat B, MD      . albuterol (PROVENTIL) (2.5 MG/3ML) 0.083% nebulizer solution 2.5 mg  2.5 mg Nebulization Q4H PRN Tyna Jaksch, MD      . artificial tears (LACRILUBE) ophthalmic ointment   Both Eyes Q4H PRN Bradly Bienenstock, NP   Given at 09/17/20 [phone]  . aspirin chewable tablet 81 mg  81 mg Oral  QHS Ralene Muskrat B, MD   81 mg at 09/28/20 2120  . budesonide (PULMICORT) nebulizer solution 0.25 mg  0.25 mg Nebulization BID Rust-Chester, Britton L, NP   0.25 mg at 09/29/20 0843  . buprenorphine-naloxone (SUBOXONE) 2-0.5 mg per SL tablet 2 tablet  2 tablet Sublingual Daily Ralene Muskrat B, MD   2 tablet at 09/29/20 [phone]  . Chlorhexidine Gluconate Cloth 2 % PADS 6 each  6 each Topical QHS Ottie Glazier, MD   6 each at 09/26/20 2131  . clopidogrel (PLAVIX) tablet 75 mg  75 mg Oral QHS Ralene Muskrat B, MD   75 mg at 09/28/20 2120  . docusate (COLACE) 50 MG/5ML liquid 100 mg  100 mg Oral BID PRN Priscella Mann, Sudheer B, MD      . enoxaparin (LOVENOX) injection 40 mg  40 mg Subcutaneous Q24H Domenic Polite, MD   40 mg at 09/28/20 1737  . fluticasone (FLONASE) 50 MCG/ACT nasal spray 1 spray  1 spray Each Nare Daily Howerter, Justin B, DO   1 spray at 09/29/20 0944  . guaiFENesin (ROBITUSSIN) 100 MG/5ML solution 200 mg  10 mL Oral Q4H PRN Ralene Muskrat B, MD   200 mg at 09/26/20 2208  . insulin aspart (novoLOG) injection 0-15 Units  0-15 Units Subcutaneous Q4H Rust-Chester, Britton L, NP   2 Units at 09/29/20 0057  . levothyroxine (SYNTHROID) tablet 88 mcg  88 mcg Oral Q0600 Ralene Muskrat B, MD   88 mcg at 09/29/20 0542  . loratadine (CLARITIN) tablet 10 mg  10 mg Oral Daily Ralene Muskrat B, MD   10 mg at 09/29/20 0942  . MEDLINE mouth rinse  15 mL Mouth Rinse BID Rust-Chester, Britton L, NP   15 mL at 09/29/20 0942  . metoprolol succinate (TOPROL-XL) 24 hr tablet 50 mg  50 mg Oral Daily Constance Haw, MD   50 mg at 09/29/20 0942  . multivitamin with minerals tablet 1 tablet  1 tablet Oral Daily Ralene Muskrat B, MD   1 tablet at 09/29/20 [phone]  . pantoprazole (PROTONIX) EC tablet 40 mg  40 mg Oral Daily Ralene Muskrat B, MD   40 mg at 09/29/20 0942  . polyethylene glycol (MIRALAX / GLYCOLAX) packet 17 g  17 g Oral Daily PRN Tyna Jaksch, MD      .  rosuvastatin (CRESTOR) tablet 40 mg  40 mg Oral QHS Visser, Jacquelyn D, PA-C   40 mg at 09/28/20 2120  . sacubitril-valsartan (ENTRESTO) 24-26 mg per tablet  1 tablet Oral BID Ralene Muskrat B, MD   1 tablet at 09/29/20 [phone]  . sodium chloride flush (NS) 0.9 % injection 3 mL  3 mL Intravenous Q12H Theora Gianotti, NP   3 mL at 09/29/20 0943  . sodium chloride flush (NS) 0.9 % injection 3 mL  3 mL Intravenous Q12H Visser, Jacquelyn D, PA-C   3 mL at 09/29/20 9518     Discharge Medications: Please see discharge summary for a list of discharge medications.  Relevant Imaging Results:  Relevant Lab Results:   Additional Information (  P) [id number]  Bridget A Cobb, LCSW

## 2020-09-29 NOTE — Progress Notes (Signed)
Speech Language Pathology Treatment: Dysphagia  Patient Details Name: Kathryn Garrett MRN: [id number] DOB: 03/16/1946 Today's Date: 09/29/2020 Time: 1425-1500 SLP Time Calculation (min) (ACUTE ONLY): 35 min  Assessment / Plan / Recommendation Clinical Impression  Pt seen for ongoing assessment of swallowing and toleration of diet; education re: swallowing, Esophageal dysmotility and impact on swallowing, as well as discussion of the results of the MBSS indicating primary Pharyngeal phase Dysphagia w/ Esophageal phase Dysmotility. Further education was given on diet consistency including need for HONEY consistency liquids and swallowing strategies rec'd; general aspiration and REFLUX precautions. Daughter from [place] was present in room.   Discussed w/ pt the results of the MBSS indicating primary pharyngeal phase dysphagia, especially the delayed pharyngeal swallow initiation and reduced airway closure resulting in SILENT aspiration of thin and Nectar liquids. Min pharyngeal phase dysmotility resulting pharyngeal residue requiring a f/u, Dry swallow to clear residue. Discussed the use of swallowing strategies to aid in reducing the pharyngeal residue but also the risk for aspiration: Chin Tuck and f/u Dry swallow w/ each bite/sip. Discussed the delayed pharyngeal swallow initiation during trials of thin and Nectar liquids and the SILENT laryngeal penetration/aspiration noted during the study -- significance of SILENT nature of the penetration/aspiration and risk for Pulmonary decline. Pt also appeared to present w/Esophageal dysmotility during the MBSS; she has a h/o REFLUX and c/o "some" Esophageal dysmotility/discomfort especially "acid reflux" for which she takes TUMS at home. ANY Esophageal phase deficits and/or GERD retrograde activity can impact the oropharyngeal phases of swallowing (such as impact of LPR) which can then impact the timing of the pharyngeal swallow thus increase risk for  (silent) aspiration to occur. ANY Reflux material or retrograde activity can increase risk for aspiration of Reflux material thus impact Pulmonary status as well as increase presentation of a "cough" during/post meals. Recommend pt f/u w/ GI for further assessment and management of Dysmotility, and Education. Discussed and Practiced general aspiration precautionsswallowing strategies of f/u, Dry swallow and Chin Tuck -- encouraged a Cough intermittently also to aid airway protection. Pt verbally repeated the strategies and exhibited adequate follow through w/ strategies w/ min verbal cues.Recommended reducing distractions including Talking at meals.   Handouts on diet consistency recommended as well as aspiration precautions and strategiesposted in Room and given to pt;Handouts on both Reflux precautionsandEsophageal motility given.Thorough education w/ pt on aspiration precautions; swallowing strategies; diet consistency including foods/liquids; thickened liquids(Honey consistency) and preparation/ordering/supplies. Also discussed REFLUX precautions and Esophageal dysmotility as this was a Baseline c/o pt's prior to admit. Again, encouraged pt to have pt f/u w/ GI for formal assessment/management and Education.  Recommend continue a Dysphagia level3(minced meats,moistened mech soft foods) diet w/Honey consistency liquids Via Cup; chin tuck; f/u, Dry swallow; aspiration precautions; REFLUX precautions; Pill in Puree for safer swallowing.  Recommend ST servicesto f/u at discharge at next venue of care/REHAB w/ ongoing dysphagia tx and f/u MBSS in ~2-4 weeks, hopefully post GI consultation and f/u for Esophageal dysmotility.Pt and Daughter agreed w/ the above; NSG agreed.     HPI HPI: Pt is a 75 yo female admitted for COPD exacerbation and left lower lobe pneumonia. Tracheal aspirate from 4/30 with KLEBSIELLA PNEUMONIAE (resistant to Ampicillin). Head CT 09/17/2020 No acute intracranial  abnormality.Past medical history including HRrEF, tobacco abuse, CAD s/p stenting, CHF, COPD who is admitted with COPD exacerbation / pneumonia / decompensated heart failure / NSTEMI.  Pt intubated 09/11/2020 thru 09/17/2020. Extubated then w/ NG placed for meds, po's.  MBSS completed  on 09/27/2020 revealing pharyngeal phase dysphagia w/ SILENT Aspiration; also noted Esophageal phase Dysmotility w/ bolus stasis and retrograde backflow b/t trials.      SLP Plan  Continue with current plan of care       Recommendations  Diet recommendations: Dysphagia 3 (mechanical soft);Honey-thick liquid Liquids provided via: Cup;No straw Medication Administration: Whole meds with puree (for safer swallowing) Supervision: Patient able to self feed;Intermittent supervision to cue for compensatory strategies (for support w/ strategies) Compensations: Minimize environmental distractions;Slow rate;Small sips/bites;Lingual sweep for clearance of pocketing;Multiple dry swallows after each bite/sip;Follow solids with liquid;Chin tuck Postural Changes and/or Swallow Maneuvers: Out of bed for meals;Seated upright 90 degrees;Upright 30-60 min after meal                General recommendations:  (Dietician f/u; Palliative care following) Oral Care Recommendations: Oral care BID;Oral care before and after PO;Staff/trained caregiver to provide oral care (denture care) Follow up Recommendations: Skilled Nursing facility (in [place]?) SLP Visit Diagnosis: Dysphagia, pharyngeal phase (R13.13);Dysphagia, pharyngoesophageal phase (R13.14) Plan: Continue with current plan of care       GO                 [name], [place], CCC-SLP Speech Language Pathologist Rehab Services [phone] [facility] 09/29/2020, 4:53 PM

## 2020-09-29 NOTE — Progress Notes (Signed)
Occupational Therapy Treatment Patient Details Name: Kathryn Garrett MRN: [id number] DOB: 1945/12/12 Today's Date: 09/29/2020    History of present illness 75 y.o. female with history of CAD with VF arrest s/p PCI to the RCA in 2017 s/p PTCA to the RCA for ISR in 2018, chronic combined systolic and diastolic CHF, COPD with ongoing tobacco use, HTN, HLD, anemia, and narcotic abuse on Suboxone therapy admitted with acute hypoxic respiratory failure requiring mechanical ventilation (extubated 09/17/2020) secondary to COPD exacerbation Klebsiella PNA and acute on chronic combined CHF with transient ST elevation and echo demonstrating new LV dysfunction.   OT comments  Pt seen for OT treatment on this date. Upon arrival to room, pt awake and seated upright in chair with daughter present (HR 80s-90s). Pt remains highly motivated to participate in OT session. At beginning of session, pt able to recall 4/6 exercises in HEP provided yesterday, and stated that she performed them independently 3x since previous OT session. Prior to mobility, pt performed x10 reps of seated therex consisting of scapular retraction, elbow flexion/extension, heel slides, and ankle pumps. Pt encouraged to perform UB ADLs while standing to improve activity tolerance and strength, with pt agreeable and able to perform x3 bouts of standing this date. Pt currently requires unilateral UE support on walker and MIN A for steadying while standing and brushing hair for ~15sec. While standing, pt only able to tolerate standing/marching in place for ~30sec before fatigued (MAX HR 126, SpO2 >92% throughout) and requesting a seated rest break. Pt is making good progress toward goals and continues to benefit from skilled OT services to maximize return to PLOF and minimize risk of future falls, injury, caregiver burden, and readmission. Will continue to follow POC. Discharge recommendation remains appropriate (if unable to go to CIR d/t insurance,  recommend at least STR at [facility]).   Follow Up Recommendations  CIR    Equipment Recommendations  3 in 1 bedside commode;Tub/shower seat       Precautions / Restrictions Precautions Precautions: Fall Restrictions Weight Bearing Restrictions: No       Mobility Bed Mobility               General bed mobility comments: Pt was in recliner pre/post session    Transfers Overall transfer level: Needs assistance Equipment used: Rolling walker (2 wheeled) Transfers: Sit to/from Stand Sit to Stand: Min guard         General transfer comment: x3 bouts, required verbal cues for safe hand placement to ensure controlled descent    Balance Overall balance assessment: Needs assistance Sitting-balance support: No upper extremity supported;Feet unsupported Sitting balance-Leahy Scale: Fair     Standing balance support: During functional activity;Bilateral upper extremity supported Standing balance-Leahy Scale: Fair Standing balance comment: Requires unilateral UE support from walker and MIN A for steadying while brushing hair                           ADL either performed or assessed with clinical judgement   ADL Overall ADL's : Needs assistance/impaired     Grooming: Brushing hair;Minimal assistance;Standing Grooming Details (indicate cue type and reason): Pt able to brush hair following set-up assist when seated. With MIN A for steadying, pt able to brush hair while standing with unilateral UE support from RW                             Functional  mobility during ADLs: Minimal assistance;Rolling walker                 Cognition Arousal/Alertness: Awake/alert Behavior During Therapy: WFL for tasks assessed/performed Overall Cognitive Status: Within Functional Limits for tasks assessed Area of Impairment: Memory;Following commands;Problem solving;Awareness                   Current Attention Level: Sustained Memory: Decreased  short-term memory Following Commands: Follows one step commands consistently;Follows multi-step commands with increased time   Awareness: Intellectual Problem Solving: Slow processing;Decreased initiation;Requires tactile cues;Requires verbal cues General Comments: Pt pleasant and agreeable. Able to recall 4/6 exercises in HEP provided yesterday.        Exercises General Exercises - Upper Extremity Elbow Flexion: AROM;Strengthening;Both;10 reps;Seated General Exercises - Lower Extremity Ankle Circles/Pumps: AROM;Both;10 reps;Seated Hip Flexion/Marching: AROM;Strengthening;Both;20 reps;Standing           Pertinent Vitals/ Pain       Pain Assessment: Faces Faces Pain Scale: Hurts a little bit Pain Location: Buttocks Pain Descriptors / Indicators: Aching Pain Intervention(s): Limited activity within patient's tolerance;Monitored during session;Repositioned         Frequency  Min 3X/week        Progress Toward Goals  OT Goals(current goals can now be found in the care plan section)  Progress towards OT goals: Progressing toward goals  Acute Rehab OT Goals Patient Stated Goal: to return to work OT Goal Formulation: With patient/family Time For Goal Achievement: 10/03/20 Potential to Achieve Goals: Good  Plan Discharge plan remains appropriate;Frequency remains appropriate    Co-evaluation        PT goals addressed during session: Mobility/safety with mobility;Balance;Proper use of DME;Strengthening/ROM        AM-PAC OT "6 Clicks" Daily Activity     Outcome Measure   Help from another person eating meals?: A Little Help from another person taking care of personal grooming?: A Little Help from another person toileting, which includes using toliet, bedpan, or urinal?: A Lot Help from another person bathing (including washing, rinsing, drying)?: A Lot Help from another person to put on and taking off regular upper body clothing?: A Little Help from another  person to put on and taking off regular lower body clothing?: A Lot 6 Click Score: 15    End of Session Equipment Utilized During Treatment: Gait belt;Rolling walker  OT Visit Diagnosis: Unsteadiness on feet (R26.81);Muscle weakness (generalized) (M62.81);Other symptoms and signs involving cognitive function   Activity Tolerance Patient tolerated treatment well   Patient Left with call bell/phone within reach;in chair;with chair alarm set;with family/visitor present   Nurse Communication Mobility status        Time: 6270-3500 OT Time Calculation (min): 30 min  Charges: OT General Charges $OT Visit: 1 Visit OT Treatments $Self Care/Home Management : 8-22 mins $Therapeutic Activity: 8-22 mins  Fredirick Maudlin, OTR/L [place]

## 2020-09-29 NOTE — Progress Notes (Signed)
PROGRESS NOTE    Kathryn Garrett  GMW:[id number] DOB: 19-Jan-1946 DOA: 09/10/2020 PCP: Imagene Riches, NP   Brief Narrative:  74/F presented to the [facility] ED from home with complaints of shortness of breath -Admitted on 4/29 with hypoxic respiratory failure secondary to COPD exacerbation and community-acquired pneumonia [facility] course was complicated by flash pulmonary edema and NSTEMI requiring mechanical ventilation, intubated on 4/30  Post extubation patient continued to slowly improve.  Initially was slated for CIR however unfortunately insurance denied.  Currently bed search initiated for skilled nursing facility.  Patient remains medically stable for discharge.   Assessment & Plan:   Principal Problem:   Acute exacerbation of chronic obstructive pulmonary disease (COPD) ([place]) Active Problems:   CAP (community acquired pneumonia)   HTN (hypertension)   Tobacco abuse   Hyperlipidemia LDL goal <70   Severe sepsis (HCC)   NSTEMI (non-ST elevated myocardial infarction) (HCC)   Hyperglycemia   SOB (shortness of breath)   Acute respiratory failure (HCC)   HFrEF (heart failure with reduced ejection fraction) (HCC)   Acute on chronic HFrEF (heart failure with reduced ejection fraction) (HCC)  Acute hypoxic respiratory failure  Acute COPD Exacerbation/left lung multifocal pneumonia Flash pulmonary edema on 4/30 -Intubated for flash pulmonary edema on, extubated 5/7 -Clinically improving, completed antibiotic course -Treated with diuretics as well, now off -Improved and stable, weaned off O2 -Discharge planning, CIR declined -Now bed search initiated for skilled nursing facility  Sepsis, left lower lobe pneumonia Dysphagia -tracheal aspirate on 4/30 with KLEBSIELLA PNEUMONIAE (resistant to Ampicillin) -Completed 7 days of antibiotics. -Improving -SLP following, continues to have dysphagia, currently on dysphagia 3, honey thick liquids -Will need close SLP FU at rehab to  monitor for continued improvement and changes to diet  Acute systolic congestive heart failure/Flash pulmonary edema NSTEMI Paroxysmal SVT -Echo noted with drop in EF down to 30%  -Briefly required inotropes in the ICU, this was then weaned off -Cardiology consulted, left heart cath noted nonobstructive coronary disease, recommended medical management and repeat echo in 2 to 3 months -Continueaspirin, Plavix, metoprolol, Entresto -Clinically euvolemic at this time -Follow-up with Dr. Irish Lack in 2 to 3 weeks  Acute metabolic/toxic encephalopathy -ICU stay complicated by severe encephalopathy while on vent -At baseline on buprenorphine/naloxone which was held on admission, now resumed -Also had an MRI brain which was unremarkable -Improved and stable now, PT OT as tolerated -CIR recommended for rehab.  Declined.  SNF bed search initiated  COPD Ongoing tobacco abuse -Counseled, tapered off prednisone, continue duo nebs   DVT prophylaxis: SQ Lovenox Code Status: Full Family Communication: None today Disposition Plan: Status is: Inpatient  Remains inpatient appropriate because:Unsafe d/c plan   Dispo: The patient is from: Home              Anticipated d/c is to: SNF              Patient currently is medically stable to d/c.   Difficult to place patient No  CIR declined admission.  Bed search initiated for skilled nursing facility.  Patient can discharge once bed is found     Level ofcare: Progressive Cardiac  Consultants:   Palliative care  Procedures:   None  Antimicrobials:   None   Subjective: Seen and examined.  Sitting up in bed.  No apparent distress.  Objective: Vitals:   09/29/20 0843 09/29/20 1104 09/29/20 1247 09/29/20 1355  BP:  (!) 87/35 97/85 (!) 89/46  Pulse:  95 94 93  Resp:  18 18 18   Temp:  98.5 F (36.9 C) 98.2 F (36.8 C) 98.5 F (36.9 C)  TempSrc:  Oral Oral Oral  SpO2: 97% 99% 99% 97%  Weight:      Height:         Intake/Output Summary (Last 24 hours) at 09/29/2020 1510 Last data filed at 09/29/2020 1103 Gross per 24 hour  Intake 460 ml  Output 275 ml  Net 185 ml   Filed Weights   09/27/20 0500 09/28/20 0428 09/29/20 0700  Weight: 57.6 kg 57.4 kg 57.6 kg    Examination:  General exam: Appears calm and comfortable.  Appears fatigued Respiratory system: Clear to auscultation. Respiratory effort normal. Cardiovascular system: S1 & S2 heard, RRR. No JVD, murmurs, rubs, gallops or clicks. No pedal edema. Gastrointestinal system: Abdomen is nondistended, soft and nontender. No organomegaly or masses felt. Normal bowel sounds heard. Central nervous system: Alert and oriented. No focal neurological deficits. Extremities: Symmetric 5 x 5 power. Skin: No rashes, lesions or ulcers Psychiatry: Judgement and insight appear normal. Mood & affect appropriate.     Data Reviewed: I have personally reviewed following labs and imaging studies  CBC: Recent Labs  Lab 09/25/20 0328 09/26/20 0404 09/27/20 0457 09/28/20 0434 09/29/20 0442  WBC 17.8* 17.8* 16.0* 10.8* 12.2*  NEUTROABS 11.4* 12.0* 11.1* 5.8 7.3  HGB 12.9 13.9 13.3 11.9* 12.1  HCT 40.4 44.3 40.8 38.3 38.2  MCV 95.7 95.9 94.7 95.8 96.2  PLT 327 336 346 339 308   Basic Metabolic Panel: Recent Labs  Lab 09/23/20 0426 09/24/20 0430 09/25/20 0328 09/26/20 0328 09/27/20 0457  NA 140 138 137 135 136  K 3.9 3.6 3.7 3.8 3.5  CL 107 108 106 105 105  CO2 23 21* 23 20* 22  GLUCOSE 98 102* 87 103* 117*  BUN 44* 36* 31* 32* 30*  CREATININE 0.51 0.43* 0.53 0.52 0.48  CALCIUM 8.7* 8.6* 9.0 8.8* 8.8*   GFR: Estimated Creatinine Clearance: 47 mL/min (by C-G formula based on SCr of 0.48 mg/dL). Liver Function Tests: No results for input(s): AST, ALT, ALKPHOS, BILITOT, PROT, ALBUMIN in the last 168 hours. No results for input(s): LIPASE, AMYLASE in the last 168 hours. No results for input(s): AMMONIA in the last 168 hours. Coagulation  Profile: No results for input(s): INR, PROTIME in the last 168 hours. Cardiac Enzymes: No results for input(s): CKTOTAL, CKMB, CKMBINDEX, TROPONINI in the last 168 hours. BNP (last 3 results) No results for input(s): PROBNP in the last 8760 hours. HbA1C: No results for input(s): HGBA1C in the last 72 hours. CBG: Recent Labs  Lab 09/28/20 1933 09/28/20 2337 09/29/20 0441 09/29/20 0808 09/29/20 1248  GLUCAP 117* 123* 99 95 147*   Lipid Profile: No results for input(s): CHOL, HDL, LDLCALC, TRIG, CHOLHDL, LDLDIRECT in the last 72 hours. Thyroid Function Tests: No results for input(s): TSH, T4TOTAL, FREET4, T3FREE, THYROIDAB in the last 72 hours. Anemia Panel: No results for input(s): VITAMINB12, FOLATE, FERRITIN, TIBC, IRON, RETICCTPCT in the last 72 hours. Sepsis Labs: No results for input(s): PROCALCITON, LATICACIDVEN in the last 168 hours.  No results found for this or any previous visit (from the past 240 hour(s)).       Radiology Studies: No results found.      Scheduled Meds: . aspirin  81 mg Oral QHS  . budesonide (PULMICORT) nebulizer solution  0.25 mg Nebulization BID  . buprenorphine-naloxone  2 tablet Sublingual Daily  . Chlorhexidine Gluconate Cloth  6 each Topical  QHS  . clopidogrel  75 mg Oral QHS  . enoxaparin (LOVENOX) injection  40 mg Subcutaneous Q24H  . fluticasone  1 spray Each Nare Daily  . insulin aspart  0-15 Units Subcutaneous Q4H  . levothyroxine  88 mcg Oral Q0600  . loratadine  10 mg Oral Daily  . mouth rinse  15 mL Mouth Rinse BID  . metoprolol succinate  50 mg Oral Daily  . multivitamin with minerals  1 tablet Oral Daily  . pantoprazole  40 mg Oral Daily  . rosuvastatin  40 mg Oral QHS  . sacubitril-valsartan  1 tablet Oral BID  . sodium chloride flush  3 mL Intravenous Q12H  . sodium chloride flush  3 mL Intravenous Q12H   Continuous Infusions: . sodium chloride Stopped (09/24/20 1321)     LOS: 19 days    Time spent: 15  minutes    Sidney Ace, MD [facility] Pager [phone]  If 7PM-7AM, please contact night-coverage 09/29/2020, 3:10 PM

## 2020-09-29 NOTE — TOC Progression Note (Signed)
Transition of Care [facility]) - Progression Note    Patient Details  Name: Kathryn Garrett MRN: [id number] Date of Birth: [birth year]  Transition of Care [facility]) CM/SW Contact  Eileen Stanford, LCSW Phone Number: 09/29/2020, 9:09 AM  Clinical Narrative:   Pt's daughter reached out to CSW and wants to try [facility] in [place] and [facility] [place]. [facility] has no short term bed avaliabilty, waiting on a call back from [place].      Barriers to Discharge: Continued Medical Work up  Expected Discharge Plan and Services   In-house Referral: Clinical Social Work   Post Acute Care Choice:  [facility]) Living arrangements for the past 2 months: Single Family Home                                       Social Determinants of Health (SDOH) Interventions    Readmission Risk Interventions No flowsheet data found.

## 2020-09-29 NOTE — TOC Progression Note (Signed)
Transition of Care [facility]) - Progression Note    Patient Details  Name: Kathryn Garrett MRN: [id number] Date of Birth: 1945/07/07  Transition of Care [facility]) CM/SW [place], LCSW Phone Number: 09/29/2020, 2:02 PM  Clinical Narrative:    [facility] in [place] has bed availability--pt's daughter confirmed she is agreeable, CSW has faxed referral, waiting on a response.     Barriers to Discharge: Continued Medical Work up  Expected Discharge Plan and Services   In-house Referral: Clinical Social Work   Post Acute Care Choice:  [facility]) Living arrangements for the past 2 months: Single Family Home                                       Social Determinants of Health (SDOH) Interventions    Readmission Risk Interventions No flowsheet data found.

## 2020-09-29 NOTE — Progress Notes (Addendum)
Daily Progress Note   Patient Name: Kathryn Garrett       Date: 09/29/2020 DOB: 1946/03/09  Age: 75 y.o. MRN#: [id number] Attending Physician: Sidney Ace, MD Primary Care Physician: Imagene Riches, NP Admit Date: 09/10/2020  Reason for Consultation/Follow-up: Establishing goals of care  Subjective: Patient is sitting on the side of the bed.  She is alone.  She states that she is tolerating her honey thick liquids but  really does not like it.  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 She states she still is unsure if she would allow a feeding tube. I discussed taking time to see if the dysphagia diet is something that she could adjust to, and to determine if she would want to continue it; discussed that without diet modifications she would be a candidate for hospice care. Upon discussing difficulty with hydration on honey thick liquids, patient states that she has not urinated since before 8:00pm yesterday evening.  Charge nurse and attending made aware. She is continuing to consider thoughts on CODE STATUS.  In regards to her swallowing, likelihood of aspiration leading to pneumonia, and poor prognosis without diet modifications, she states to me " do not, do not, I mean do not talk to my girls or my son about any of this."  She states what they are currently aware of is all she wants them to be aware of.  Advised her that I would lock today's note as well.  I would recommend outpatient palliative to follow for further conversations and to provide time to see how she does with interventions.  Again discussed that if she chooses to select someone as an HPOA that  would honor her wishes, that document can be completed during this hospitalization.  She understands that without HPOA documents, her children would be her surrogate decision makers.  She is also aware that a living will can be completed during this hospitalization as well.  Length of Stay: 19  Current Medications: Scheduled Meds:  . aspirin  81 mg Oral QHS  . budesonide (PULMICORT) nebulizer solution  0.25 mg Nebulization BID  . buprenorphine-naloxone  2 tablet Sublingual Daily  . Chlorhexidine  Gluconate Cloth  6 each Topical QHS  . clopidogrel  75 mg Oral QHS  . enoxaparin (LOVENOX) injection  40 mg Subcutaneous Q24H  . fluticasone  1 spray Each Nare Daily  . insulin aspart  0-15 Units Subcutaneous Q4H  . levothyroxine  88 mcg Oral Q0600  . loratadine  10 mg Oral Daily  . mouth rinse  15 mL Mouth Rinse BID  . metoprolol succinate  50 mg Oral Daily  . multivitamin with minerals  1 tablet Oral Daily  . pantoprazole  40 mg Oral Daily  . rosuvastatin  40 mg Oral QHS  . sacubitril-valsartan  1 tablet Oral BID  . sodium chloride flush  3 mL Intravenous Q12H  . sodium chloride flush  3 mL Intravenous Q12H    Continuous Infusions: . sodium chloride Stopped (09/24/20 1321)    PRN Meds: acetaminophen **OR** acetaminophen, albuterol, artificial tears, docusate, guaiFENesin, polyethylene glycol  Physical Exam Pulmonary:     Effort: Pulmonary effort is normal.  Neurological:     Mental Status: She is alert.             Vital Signs: BP (!) 87/35 (BP Location: Left Arm)   Pulse 95   Temp 98.5 F (36.9 C) (Oral)   Resp 18   Ht 4\' 11"  (1.499 m)   Wt 57.6 kg   SpO2 99%   BMI 25.65 kg/m  SpO2: SpO2: 99 % O2 Device: O2 Device: Room Air O2 Flow Rate: O2 Flow Rate (L/min): 1 L/min  Intake/output summary:   Intake/Output Summary (Last 24 hours) at 09/29/2020 1148 Last data filed at 09/29/2020 1103 Gross per 24 hour  Intake 460 ml  Output 275 ml  Net 185 ml   LBM: Last BM  Date: 09/26/20 Baseline Weight: Weight: 68.2 kg Most recent weight: Weight: 57.6 kg        Patient Active Problem List   Diagnosis Date Noted  . Acute on chronic HFrEF (heart failure with reduced ejection fraction) ([place])   . Acute respiratory failure ([place]) 09/11/2020  . HFrEF (heart failure with reduced ejection fraction) ([place])   . Acute exacerbation of chronic obstructive pulmonary disease (COPD) ([place]) 09/10/2020  . Severe sepsis ([place]) 09/10/2020  . NSTEMI (non-ST elevated myocardial infarction) ([place]) 09/10/2020  . Hyperglycemia 09/10/2020  . SOB (shortness of breath) 09/10/2020  . Chronic obstructive pulmonary disease ([place]) 03/05/2018  . History of opioid abuse ([place]) 03/05/2018  . Nocturnal hypoxemia 10/30/2017  . Peripheral arterial disease ([place]) 08/30/2016  . Chest pain 06/04/2016  . S/P PTCA (percutaneous transluminal coronary angioplasty) 02/10/16 to RCA lesion for in stent restenois 02/11/2016  . Unstable angina ([place])   . Hyperlipidemia LDL goal <70 01/28/2016  . Presence of coronary angioplasty implant and graft 10/15/2015  . Tobacco abuse 10/15/2015  . CAP (community acquired pneumonia) 09/24/2015  . CAD (coronary artery disease) 09/24/2015  . HTN (hypertension) 09/24/2015  . Lung nodule, solitary 03/31/2014  . Liver nodule 03/31/2014    Palliative Care Assessment & Plan   Recommendations/Plan:  Recommend outpatient palliative to follow.  She will need time for adjustment and to decide definitively what she would like to do moving forward.      Code Status:    Code Status Orders  (From admission, onward)         Start     Ordered   09/11/20 0044  Full code  Continuous        09/11/20 0044  Code Status History    Date Active Date Inactive Code Status Order ID Comments User Context   09/10/2020 1615 09/11/2020 0044 Full Code [id number]  Rhetta Mura, DO ED   06/04/2016 1751 06/06/2016 1520 Full Code [id number]  Cheryln Manly, NP ED    02/10/2016 0908 02/11/2016 1551 Full Code [id number]  Jettie Booze, MD Inpatient   09/24/2015 2245 10/01/2015 1719 Full Code [id number]  Lance Coon, MD ED   Advance Care Planning Activity       Prognosis:  Poor without diet modifications.  Care plan was discussed with primary team.  Thank you for allowing the Palliative Medicine Team to assist in the care of this patient.   Total Time 35 min Prolonged Time Billed no      Greater than 50%  of this time was spent counseling and coordinating care related to the above assessment and plan.  Asencion Gowda, NP  Please contact Palliative Medicine Team phone at [phone] for questions and concerns.

## 2020-09-30 ENCOUNTER — Ambulatory Visit: Payer: Medicare HMO | Admitting: Family

## 2020-09-30 DIAGNOSIS — J441 Chronic obstructive pulmonary disease with (acute) exacerbation: Secondary | ICD-10-CM | POA: Diagnosis not present

## 2020-09-30 LAB — GLUCOSE, CAPILLARY
Glucose-Capillary: 104 mg/dL — ABNORMAL HIGH (ref 70–99)
Glucose-Capillary: 113 mg/dL — ABNORMAL HIGH (ref 70–99)
Glucose-Capillary: 118 mg/dL — ABNORMAL HIGH (ref 70–99)
Glucose-Capillary: 153 mg/dL — ABNORMAL HIGH (ref 70–99)
Glucose-Capillary: 158 mg/dL — ABNORMAL HIGH (ref 70–99)
Glucose-Capillary: 78 mg/dL (ref 70–99)

## 2020-09-30 LAB — CBC WITH DIFFERENTIAL/PLATELET
Abs Immature Granulocytes: 0.03 10*3/uL (ref 0.00–0.07)
Basophils Absolute: 0 10*3/uL (ref 0.0–0.1)
Basophils Relative: 1 %
Eosinophils Absolute: 0.2 10*3/uL (ref 0.0–0.5)
Eosinophils Relative: 3 %
HCT: 34.8 % — ABNORMAL LOW (ref 36.0–46.0)
Hemoglobin: 10.9 g/dL — ABNORMAL LOW (ref 12.0–15.0)
Immature Granulocytes: 0 %
Lymphocytes Relative: 34 %
Lymphs Abs: 2.8 10*3/uL (ref 0.7–4.0)
MCH: 29.6 pg (ref 26.0–34.0)
MCHC: 31.3 g/dL (ref 30.0–36.0)
MCV: 94.6 fL (ref 80.0–100.0)
Monocytes Absolute: 0.7 10*3/uL (ref 0.1–1.0)
Monocytes Relative: 8 %
Neutro Abs: 4.6 10*3/uL (ref 1.7–7.7)
Neutrophils Relative %: 54 %
Platelets: 351 10*3/uL (ref 150–400)
RBC: 3.68 MIL/uL — ABNORMAL LOW (ref 3.87–5.11)
RDW: 14.5 % (ref 11.5–15.5)
WBC: 8.4 10*3/uL (ref 4.0–10.5)
nRBC: 0 % (ref 0.0–0.2)

## 2020-09-30 LAB — BASIC METABOLIC PANEL
Anion gap: 7 (ref 5–15)
BUN: 20 mg/dL (ref 8–23)
CO2: 24 mmol/L (ref 22–32)
Calcium: 8.5 mg/dL — ABNORMAL LOW (ref 8.9–10.3)
Chloride: 106 mmol/L (ref 98–111)
Creatinine, Ser: 0.56 mg/dL (ref 0.44–1.00)
GFR, Estimated: >60 mL/min (ref >60)
Glucose, Bld: 109 mg/dL — ABNORMAL HIGH (ref 70–99)
Potassium: 3.4 mmol/L — ABNORMAL LOW (ref 3.5–5.1)
Sodium: 137 mmol/L (ref 135–145)

## 2020-09-30 MED ORDER — POTASSIUM CHLORIDE CRYS ER 20 MEQ PO TBCR
40.0000 meq | EXTENDED_RELEASE_TABLET | Freq: Once | ORAL | Status: AC
  Administered 2020-09-30: 40 meq via ORAL
  Filled 2020-09-30: qty 2

## 2020-09-30 NOTE — Progress Notes (Signed)
Nutrition Follow Up Note   DOCUMENTATION CODES:   Obesity unspecified  INTERVENTION:   Discontinue Nepro as pt downgraded to honey thick liquids   Add Honey Thick Mighty Shake TID, each supplement provides 200kcal and 7g protein   Magic cup TID with meals, each supplement provides 290 kcal and 9 grams of protein  MVI po daily   NUTRITION DIAGNOSIS:   Inadequate oral intake related to inability to eat (pt sedated and ventilated) as evidenced by NPO status.  GOAL:   Patient will meet greater than or equal to 90% of their needs  -progressing   MONITOR:   PO intake,Supplement acceptance,Labs,Weight trends,Skin,I & O's  ASSESSMENT:   75 y/o female with h/o COPD, HTN, NSTEMI, CHF and lung mass who is admitted with PNA and possible acute a stroke involving left MCA territory   Pt's appetite continues to wax and wane; she is eating anywhere from sips/bites to 100% of meals. Pt with suspected continued aspiration and coughing when eating. Pt seen by SLP on 5/18 and downgraded to honey thick liquids. Nepro discontinued and honey thick mighty shakes were added by RD yesterday. Per chart, pt is down ~10lbs(8%) since admit and from her UBW. Pt does appears to be weight stable for the past week. Palliative care following and has been discussing with pt about G-tube; pt is still unsure if she would want a feeding tube. Pt remains at refeed risk. CIR declined patient; pt pending SNF placement.   Medications reviewed and include: aspirin, plavix, lovenox, insulin, synthroid, MVI, protonix  Labs reviewed: K 3.4(L) Hgb 10.9(L), Hct 34.8(L)  Diet Order:   Diet Order            DIET DYS 3 Room service appropriate? Yes; Fluid consistency: Honey Thick  Diet effective now                EDUCATION NEEDS:   No education needs have been identified at this time  Skin:  Skin Assessment: Reviewed RN Assessment  Last BM:  5/19- TYPE 3  Height:   Ht Readings from Last 1 Encounters:   09/24/20 4\' 11"  (1.499 m)    Weight:   Wt Readings from Last 1 Encounters:  09/30/20 56.5 kg    Ideal Body Weight:  44.5 kg  BMI:  Body mass index is 25.15 kg/m.  Estimated Nutritional Needs:   Kcal:  1400-1600kcal/day  Protein:  70-80g/day  Fluid:  1.4L/day  [name] MS, RD, LDN Please refer to [facility] for RD and/or RD on-call/weekend/after hours pager

## 2020-09-30 NOTE — Progress Notes (Signed)
NAME:  Kathryn Garrett, MRN:  [id number], DOB:  [birth year], LOS: 71 ADMISSION DATE:  09/10/2020, CONSULTATION DATE: 09/11/2020 REFERRING MD: Dr. Damita Dunnings, CHIEF COMPLAINT: Shortness of breath  History of Present Illness:  75 year old female presented to the [facility] ED from home with complaints of shortness of breath.  Patient reported 2 to 3 days of progressive shortness of breath and associated new onset nonproductive cough as well as a subjective fever.  Per ED documentation she denied associated chest pain/palpitations/diaphoresis.  And denies any recent nausea/vomiting, chills/rigors/myalgias.  Patient confirmed a history of COPD and current smoking history stating she is not on any supplemental oxygen at baseline.  ED course: Patient received cefepime due to concerns for a left lower lobe infiltrate, 500 mL NS bolus & IV fluids at 150 mL an hour Initial vitals: Afebrile at 98, tachypneic at 22, tachycardic at 108, BP 127/72 & 99% on BiPAP Significant labs: Serum bicarb 15, hyperglycemic at 350, troponin 70 > 745 > 1838, BNP 222.6, VBG revealed metabolic acidosis: 3.71/69/678/93.8, leukocytosis at 40.2, lactic acidosis 2.5> 2.2 > 2.5  TRH hospitalist were consulted for admission.  Around midnight PCCM was called due to patient's deteriorating respiratory status.  Per ED provider Dr. Karma Greaser and care RN the patient complained that " I can't breathe", was given a dose of Ativan and became somnolent on BiPAP.  Coarse crackles auscultated bilaterally, and the decision was made to emergently intubate the patient and place her on mechanical ventilation. PCCM consulted for management  Significant Hospital Events: Including procedures, antibiotic start and stop dates in addition to other pertinent events   . 09/10/2020-patient admits admitted with hospitalist service for COPD exacerbation and left lower lobe pneumonia . 09/11/2020-overnight patient had suspected flash pulmonary edema, dyspnea followed by  somnolence requiring emergent intubation and mechanical ventilation.  Admitted to ICU . 09/12/20- patient failed SBT , she had blood tinged secretions fromETT and aggitation, family was at bedside and we agreed on giving her more time and try again.  . 09/14/20- Failed SBT (became hypoxic with sats mid 80's, increased WOB and assessory muscle use, HR 140-160's). Will diurese today and add Metoprolol.  Tracheal aspirate from 4/30 with KLEBSIELLA PNEUMONIAE (resistant to Ampicillin)   . 09/15/20- Failed SBT (increased WOB and assessory muscle use, RR 40's, HR 140-150's, Hypertensive); plan to add scheduled PO Klonopin and Oxycodone, Lasix 40 mg IV x1, consult Palliative Care . 09/16/20- Worsening Leukocytosis up to 21.7, low grade fever overnight, increased secretions overnight, will repeat Tracheal aspirate, remove central line, add mucinex, plan for SBT . 09/17/20-[name] passed SBT in AM and following commands still having some moderate secretions . 09/18/20-  Extubated yesterday.  Failed swallow eval today.  . 09/20/20- Weaned to room air, Leukocytosis improving; Hemodynamically stable . 09/27/20- patient is coughing up phlegm but clinically is improved, we discussed pneumonia and COPD with outpatient clinic follow up with pulmonology.  There is plan for post dc rehab . 09/28/20- patient is improved and she is being optimized for CIR, pulmonary will sign off and can follow up on outpatient basis . 09/29/20- family at bedside today.  We discussed barium swallow with aspiration.  . 09/30/20- patient had PT today she walked today without need of additional supplemental O2 therapy.  She generally has been walking ok but has had severe LE weakness here.  She ate lunch infront of me had cough after couple bites.  We disucssed aspiartion risk and her barium swallow.      Objective  Blood pressure (!) 105/45, pulse 78, temperature 97.8 F (36.6 C), resp. rate 15, height 4\' 11"  (1.499 m), weight 56.5 kg, SpO2 96 %.         Intake/Output Summary (Last 24 hours) at 09/30/2020 1006 Last data filed at 09/30/2020 0948 Gross per 24 hour  Intake 2643.5 ml  Output 1025 ml  Net 1618.5 ml   Filed Weights   09/28/20 0428 09/29/20 0700 09/30/20 0346  Weight: 57.4 kg 57.6 kg 56.5 kg    Examination: General: Acute on chronically ill-appearing female, laying in bed, breathing treatment and chest PT via bed currently in place, in no acute distress HEENT: Atraumatic, normocephalic, neck supple, no JVD Neuro: Awake, alert and oriented x3, moves all extremities to command (generalized weakness), no focal deficits, speech clear CV: Tachycardia, regular rate and rhythm with occasional PACs, S1-S2, no murmurs, rubs, gallops, 2+ distal pulses Pulm: Coarse breath sounds bilaterally, no wheezing or rales noted, even, nonlabored GI: Soft, nontender, nondistended, no guarding or rebound tenderness, bowel sounds positive x4 Skin: Limited exam-warm and dry.  No obvious rashes, lesions, ulcerations Extremities: Warm and dry.  No deformities, minimal edema   Labs/imaging that I have personally reviewed  (right click and "Reselect all SmartList Selections" daily)  Labs 09/20/2020: WBC 20.1, K 3.0, glucose 124, BUN 68, Cr. 0.66 Chest x-ray 09/16/2020>>Endotracheal tube, NG tube, right IJ line in stable position. Heart size normal. Persistent but improved bilateral interstitial infiltrates. Tiny left pleural effusion cannot be excluded. No pneumothorax. MRI brain 09/13/2020:No acute brain finding. Mild chronic small-vessel ischemic change of the white matter, fairly typical for age.  Assessment & Plan:   Acute hypoxic respiratory failure secondary to Acute COPD Exacerbation & left lung multifocal pneumonia -S/p Extubation -Supplemental O2 as needed to maintain O2 sats 88 to 94% -Follow intermittent CXR & ABG as needed -Bronchodilators -Continue Budesonide nebs -Completed 7 days course of Cefepime/Ancef -Aggressive pulmonary  toilet -PT/OT  Sepsis due to left lung multifocal pneumonia>> tracheal aspirate on 4/30 with KLEBSIELLA PNEUMONIAE (resistant to Ampicillin)>>treated -Monitor fever curve -Trend WBCs.  Elevated WBC in part related to steroids. -Follow cultures as above -Completed 7 days of Cefepime/Ancef  Acute systolic congestive heart failure Acute non-ST elevation MI New diagnosis of A. fib with RVR -Continuous cardiac monitoring -Patient's ejection fraction on echocardiogram is 30% -Diuresis as renal function and BP permits >> currently on 40 mg IV Lasix daily and will continue -Cardiology following, input is appreciated -Milrinone has been tapered off -Patient completed therapy with IV heparin -Continue aspirin Plavix and statin -She will need left heart cath once stable -Metoprolol BID per tube  Acute metabolic/toxic encephalopathy>>improving -Improved now that she is extubated and off continuous sedation -stop scheduled Klonopin as unable to take p.o. -Was on buprenorphine/naloxone as an outpatient which is now held ~ continue scheduled Oxycodone per tube -Provide supportive care -CT Head at admission negative -MRI Brain 09/13/20 negative -Continue Aspirin, Plavix, Atorvastatin   Best practice (right click and "Reselect all SmartList Selections" daily)  Diet:  NPO.  Repeat swallow evaluation to be performed on 09/20/2020 Pain/Anxiety/Delirium protocol (if indicated): Oxycodone VAP protocol (if indicated): Now extubated. DVT prophylaxis: SCD (chemical prophylaxis held due to previous hemoptysis) GI prophylaxis: H2B Glucose control:  SSI Yes Central venous access: n/a Arterial line:  N/A Foley: N/A Mobility: Out of bed with assistance as tolerated PT consulted: needs Pt/OT Code Status:  full code Disposition: Stepdown   Labs   CBC: Recent Labs  Lab 09/26/20 0404 09/27/20 0457  09/28/20 0434 09/29/20 0442 09/30/20 0430  WBC 17.8* 16.0* 10.8* 12.2* 8.4  NEUTROABS 12.0* 11.1*  5.8 7.3 4.6  HGB 13.9 13.3 11.9* 12.1 10.9*  HCT 44.3 40.8 38.3 38.2 34.8*  MCV 95.9 94.7 95.8 96.2 94.6  PLT 336 346 339 372 102    Basic Metabolic Panel: Recent Labs  Lab 09/24/20 0430 09/25/20 0328 09/26/20 0328 09/27/20 0457 09/30/20 0430  NA 138 137 135 136 137  K 3.6 3.7 3.8 3.5 3.4*  CL 108 106 105 105 106  CO2 21* 23 20* 22 24  GLUCOSE 102* 87 103* 117* 109*  BUN 36* 31* 32* 30* 20  CREATININE 0.43* 0.53 0.52 0.48 0.56  CALCIUM 8.6* 9.0 8.8* 8.8* 8.5*   GFR: Estimated Creatinine Clearance: 46.5 mL/min (by C-G formula based on SCr of 0.56 mg/dL). Recent Labs  Lab 09/27/20 0457 09/28/20 0434 09/29/20 0442 09/30/20 0430  WBC 16.0* 10.8* 12.2* 8.4    Liver Function Tests: No results for input(s): AST, ALT, ALKPHOS, BILITOT, PROT, ALBUMIN in the last 168 hours. No results for input(s): LIPASE, AMYLASE in the last 168 hours. No results for input(s): AMMONIA in the last 168 hours.  ABG    Component Value Date/Time   PHART 7.38 09/15/2020 0500   PCO2ART 41 09/15/2020 0500   PO2ART 123 (H) 09/15/2020 0500   HCO3 33.5 (H) 09/17/2020 0944   TCO2 23 01/14/2009 0327   ACIDBASEDEF 0.8 09/15/2020 0500   O2SAT 88.8 09/17/2020 0944     Coagulation Profile: No results for input(s): INR, PROTIME in the last 168 hours.  Cardiac Enzymes: No results for input(s): CKTOTAL, CKMB, CKMBINDEX, TROPONINI in the last 168 hours.  HbA1C: Hgb A1c MFr Bld  Date/Time Value Ref Range Status  09/11/2020 04:37 AM 6.4 (H) 4.8 - 5.6 % Final    Comment:    (NOTE) Pre diabetes:          5.7%-6.4%  Diabetes:              >6.4%  Glycemic control for   <7.0% adults with diabetes   10/02/2019 12:28 PM 6.4 (H) 4.8 - 5.6 % Final    Comment:             Prediabetes: 5.7 - 6.4          Diabetes: >6.4          Glycemic control for adults with diabetes: <7.0     CBG: Recent Labs  Lab 09/29/20 1619 09/29/20 2024 09/29/20 2311 09/30/20 0342 09/30/20 0736  GLUCAP 79 130* 121*  113* 118*      Ottie Glazier, M.D.  [facility] [place]

## 2020-09-30 NOTE — Progress Notes (Signed)
Mobility Specialist - Progress Note   09/30/20 1600  Mobility  Activity Pillow/wedge support  Level of Assistance Minimal assist, patient does 75% or more  Assistive Device Front wheel walker  Distance Ambulated (ft) 0 ft  Mobility Response Tolerated well  Mobility performed by Mobility specialist  $Mobility charge 1 Mobility    Pt sitting in recliner on arrival, agreed to session. Pt slouched down in chair and c/o pressure wound on buttocks. Upon first standing attempt, pt begins c/o feeling weak and shaky. NT entered to check vitals, glucose 78. Ambulation deferred. Pt stood x1 with minA for pillow wedge support. Session concluded. RN notified.    Kathee Delton Mobility Specialist 09/30/20, 4:53 PM

## 2020-09-30 NOTE — Evaluation (Signed)
Occupational Therapy Re-Evaluation Patient Details Name: Kathryn Garrett MRN: [id number] DOB: [birth year] Today's Date: 09/30/2020    History of Present Illness 75 y.o. female with history of CAD with VF arrest s/p PCI to the RCA in 2017 s/p PTCA to the RCA for ISR in 2018, chronic combined systolic and diastolic CHF, COPD with ongoing tobacco use, HTN, HLD, anemia, and narcotic abuse on Suboxone therapy admitted with acute hypoxic respiratory failure requiring mechanical ventilation (extubated 09/17/2020) secondary to COPD exacerbation Klebsiella PNA and acute on chronic combined CHF with transient ST elevation and echo demonstrating new LV dysfunction.   Clinical Impression   Kathryn Garrett was seen for OT re-evaluation this date 2/2 prolonged hospital stay. Goals updated this date to reflect pt progress. Upon arrival to room pt seated in chair, agreeable to tx. Pt completed x4 STS from chair height c CGA + RW. Tolerated longest standing bout 90 secs, HR 130 bpm. MIN A for BSC t/f, SETUP perihygiene with lateral leans. Pt demonstrated poor activity tolerance requiring standing rest break following ~2 ft mobility. Pt making good progress toward goals. Pt continues to benefit from skilled OT services to maximize return to PLOF and minimize risk of future falls, injury, caregiver burden, and readmission. Will continue to follow POC. Discharge recommendation remains appropriate.      Follow Up Recommendations  CIR    Equipment Recommendations  3 in 1 bedside commode;Tub/shower seat    Recommendations for Other Services       Precautions / Restrictions Precautions Precautions: Fall Restrictions Weight Bearing Restrictions: No      Mobility Bed Mobility               General bed mobility comments: pt received and left in chair    Transfers Overall transfer level: Needs assistance Equipment used: Rolling walker (2 wheeled) Transfers: Sit to/from Stand Sit to Stand: Min guard          General transfer comment: VCs for hand placement    Balance Overall balance assessment: Needs assistance Sitting-balance support: No upper extremity supported;Feet unsupported Sitting balance-Leahy Scale: Fair     Standing balance support: During functional activity;Bilateral upper extremity supported Standing balance-Leahy Scale: Fair                             ADL either performed or assessed with clinical judgement   ADL Overall ADL's : Needs assistance/impaired                                       General ADL Comments: MIN A for BSC t/f. SETUP perihygiene with lateral leans.                  Pertinent Vitals/Pain Pain Assessment: No/denies pain              Cognition Arousal/Alertness: Awake/alert Behavior During Therapy: WFL for tasks assessed/performed Overall Cognitive Status: Within Functional Limits for tasks assessed                                     General Comments  Seated BP 121/52, MAP 68, HR 96    Exercises Exercises: Other exercises Other Exercises Other Exercises: Pt educated re: OT role, DME recs, d/c recs, falls prevention, ECS, HEP Other Exercises: Toileting,  sit<>stand x4, sitting/standing balance/tolerance, ~10 ft mobility, seated crunches                      OT Goals(Current goals can be found in the care plan section) Acute Rehab OT Goals Patient Stated Goal: to return to work OT Goal Formulation: With patient/family Time For Goal Achievement: 10/14/20 Potential to Achieve Goals: Good ADL Goals Pt Will Perform Upper Body Bathing: with modified independence;sitting Pt Will Transfer to Toilet: with modified independence;ambulating;regular height toilet (c LRAD PRN) Pt Will Perform Toileting - Clothing Manipulation and hygiene: with modified independence;sitting/lateral leans Additional ADL Goal #1: Pt will tolerate >5 mins standing grooming tasks c SUPERVISION and LRAD PRN   OT Frequency: Min 3X/week    AM-PAC OT "6 Clicks" Daily Activity     Outcome Measure Help from another person eating meals?: A Little Help from another person taking care of personal grooming?: A Little Help from another person toileting, which includes using toliet, bedpan, or urinal?: A Little Help from another person bathing (including washing, rinsing, drying)?: A Lot Help from another person to put on and taking off regular upper body clothing?: A Little Help from another person to put on and taking off regular lower body clothing?: A Lot 6 Click Score: 16   End of Session Equipment Utilized During Treatment: Rolling walker  Activity Tolerance: Patient tolerated treatment well Patient left: with call bell/phone within reach;with chair alarm set  OT Visit Diagnosis: Unsteadiness on feet (R26.81);Muscle weakness (generalized) (M62.81);Other symptoms and signs involving cognitive function                Time: 1101-1140 OT Time Calculation (min): 39 min Charges:  OT General Charges $OT Visit: 1 Visit OT Evaluation $OT Re-eval: 1 Re-eval OT Treatments $Self Care/Home Management : 23-37 mins $Therapeutic Exercise: 8-22 mins   Dessie Coma, M.S. OTR/L  09/30/20, 1:36 PM  ascom [phone]

## 2020-09-30 NOTE — Care Management Important Message (Signed)
Important Message  Patient Details  Name: Kathryn Garrett MRN: [id number] Date of Birth: 1945/08/05   Medicare Important Message Given:  Yes     Dannette Barbara 09/30/2020, 1:53 PM

## 2020-09-30 NOTE — Progress Notes (Signed)
PROGRESS NOTE    Kathryn Garrett  YNW:[id number] DOB: [birth year] DOA: 09/10/2020 PCP: Imagene Riches, NP   Brief Narrative:  74/F presented to the [facility] ED from home with complaints of shortness of breath -Admitted on 4/29 with hypoxic respiratory failure secondary to COPD exacerbation and community-acquired pneumonia [facility] course was complicated by flash pulmonary edema and NSTEMI requiring mechanical ventilation, intubated on 4/30  Post extubation patient continued to slowly improve.  Initially was slated for CIR however unfortunately insurance denied.  Currently bed search initiated for skilled nursing facility.  Patient remains medically stable for discharge.   Assessment & Plan:   Principal Problem:   Acute exacerbation of chronic obstructive pulmonary disease (COPD) (HCC) Active Problems:   CAP (community acquired pneumonia)   HTN (hypertension)   Tobacco abuse   Hyperlipidemia LDL goal <70   Severe sepsis (HCC)   NSTEMI (non-ST elevated myocardial infarction) (HCC)   Hyperglycemia   SOB (shortness of breath)   Acute respiratory failure (HCC)   HFrEF (heart failure with reduced ejection fraction) (HCC)   Acute on chronic HFrEF (heart failure with reduced ejection fraction) (HCC)  Acute hypoxic respiratory failure  Acute COPD Exacerbation/left lung multifocal pneumonia Flash pulmonary edema on 4/30 -Intubated for flash pulmonary edema on, extubated 5/7 -Clinically improving, completed antibiotic course -Treated with diuretics as well, now off -Improved and stable, weaned off O2 -Discharge planning, CIR declined -Now bed search initiated for skilled nursing facility -[facility] aware and is following for placement options  Sepsis, left lower lobe pneumonia Dysphagia -tracheal aspirate on 4/30 with KLEBSIELLA PNEUMONIAE (resistant to Ampicillin) -Completed 7 days of antibiotics. -Improving -SLP following, continues to have dysphagia, currently on dysphagia 3, honey  thick liquids -Will need close SLP FU at rehab to monitor for continued improvement and changes to diet  Acute systolic congestive heart failure/Flash pulmonary edema NSTEMI Paroxysmal SVT -Echo noted with drop in EF down to 30%  -Briefly required inotropes in the ICU, this was then weaned off -Cardiology consulted, left heart cath noted nonobstructive coronary disease, recommended medical management and repeat echo in 2 to 3 months -Continueaspirin, Plavix, metoprolol, Entresto -Clinically euvolemic at this time -Follow-up with Dr. Irish Lack in 2 to 3 weeks  Acute metabolic/toxic encephalopathy -ICU stay complicated by severe encephalopathy while on vent -At baseline on buprenorphine/naloxone which was held on admission, now resumed -Also had an MRI brain which was unremarkable -Improved and stable now, PT OT as tolerated -CIR recommended for rehab.  Declined.  SNF bed search initiated  COPD Ongoing tobacco abuse -Counseled, tapered off prednisone, continue duo nebs   DVT prophylaxis: SQ Lovenox Code Status: Full Family Communication: None today Disposition Plan: Status is: Inpatient  Remains inpatient appropriate because:Unsafe d/c plan   Dispo: The patient is from: Home              Anticipated d/c is to: SNF              Patient currently is medically stable to d/c.   Difficult to place patient No  Initial plan for CIR.  Insurance declined.  Bed search initiated for skilled nursing facility.     Level ofcare: Progressive Cardiac  Consultants:   Palliative care  Procedures:   None  Antimicrobials:   None   Subjective: Seen and examined.  Sitting up in bed.  No apparent distress.  Objective: Vitals:   09/29/20 2313 09/30/20 0346 09/30/20 0735 09/30/20 1138  BP: (!) 105/50 (!) 105/45 (!) 105/45 (!) 121/58  Pulse: 93 87 78 96  Resp: 18 18 15 17   Temp: 98.7 F (37.1 C) 98.4 F (36.9 C) 97.8 F (36.6 C) 97.7 F (36.5 C)  TempSrc: Oral Oral   Oral  SpO2: 94% 95% 96% 100%  Weight:  56.5 kg    Height:        Intake/Output Summary (Last 24 hours) at 09/30/2020 1315 Last data filed at 09/30/2020 0950 Gross per 24 hour  Intake 2683.5 ml  Output 750 ml  Net 1933.5 ml   Filed Weights   09/28/20 0428 09/29/20 0700 09/30/20 0346  Weight: 57.4 kg 57.6 kg 56.5 kg    Examination:  General exam: Appears calm and comfortable.  Appears fatigued Respiratory system: Clear to auscultation. Respiratory effort normal. Cardiovascular system: S1 & S2 heard, RRR. No JVD, murmurs, rubs, gallops or clicks. No pedal edema. Gastrointestinal system: Abdomen is nondistended, soft and nontender. No organomegaly or masses felt. Normal bowel sounds heard. Central nervous system: Alert and oriented. No focal neurological deficits. Extremities: Symmetric 5 x 5 power. Skin: No rashes, lesions or ulcers Psychiatry: Judgement and insight appear normal. Mood & affect appropriate.     Data Reviewed: I have personally reviewed following labs and imaging studies  CBC: Recent Labs  Lab 09/26/20 0404 09/27/20 0457 09/28/20 0434 09/29/20 0442 09/30/20 0430  WBC 17.8* 16.0* 10.8* 12.2* 8.4  NEUTROABS 12.0* 11.1* 5.8 7.3 4.6  HGB 13.9 13.3 11.9* 12.1 10.9*  HCT 44.3 40.8 38.3 38.2 34.8*  MCV 95.9 94.7 95.8 96.2 94.6  PLT 336 346 339 372 010   Basic Metabolic Panel: Recent Labs  Lab 09/24/20 0430 09/25/20 0328 09/26/20 0328 09/27/20 0457 09/30/20 0430  NA 138 137 135 136 137  K 3.6 3.7 3.8 3.5 3.4*  CL 108 106 105 105 106  CO2 21* 23 20* 22 24  GLUCOSE 102* 87 103* 117* 109*  BUN 36* 31* 32* 30* 20  CREATININE 0.43* 0.53 0.52 0.48 0.56  CALCIUM 8.6* 9.0 8.8* 8.8* 8.5*   GFR: Estimated Creatinine Clearance: 46.5 mL/min (by C-G formula based on SCr of 0.56 mg/dL). Liver Function Tests: No results for input(s): AST, ALT, ALKPHOS, BILITOT, PROT, ALBUMIN in the last 168 hours. No results for input(s): LIPASE, AMYLASE in the last 168  hours. No results for input(s): AMMONIA in the last 168 hours. Coagulation Profile: No results for input(s): INR, PROTIME in the last 168 hours. Cardiac Enzymes: No results for input(s): CKTOTAL, CKMB, CKMBINDEX, TROPONINI in the last 168 hours. BNP (last 3 results) No results for input(s): PROBNP in the last 8760 hours. HbA1C: No results for input(s): HGBA1C in the last 72 hours. CBG: Recent Labs  Lab 09/29/20 2024 09/29/20 2311 09/30/20 0342 09/30/20 0736 09/30/20 1141  GLUCAP 130* 121* 113* 118* 158*   Lipid Profile: No results for input(s): CHOL, HDL, LDLCALC, TRIG, CHOLHDL, LDLDIRECT in the last 72 hours. Thyroid Function Tests: No results for input(s): TSH, T4TOTAL, FREET4, T3FREE, THYROIDAB in the last 72 hours. Anemia Panel: No results for input(s): VITAMINB12, FOLATE, FERRITIN, TIBC, IRON, RETICCTPCT in the last 72 hours. Sepsis Labs: No results for input(s): PROCALCITON, LATICACIDVEN in the last 168 hours.  No results found for this or any previous visit (from the past 240 hour(s)).       Radiology Studies: No results found.      Scheduled Meds: . aspirin  81 mg Oral QHS  . budesonide (PULMICORT) nebulizer solution  0.25 mg Nebulization BID  . buprenorphine-naloxone  2 tablet Sublingual  Daily  . Chlorhexidine Gluconate Cloth  6 each Topical QHS  . clopidogrel  75 mg Oral QHS  . enoxaparin (LOVENOX) injection  40 mg Subcutaneous Q24H  . fluticasone  1 spray Each Nare Daily  . insulin aspart  0-15 Units Subcutaneous Q4H  . levothyroxine  88 mcg Oral Q0600  . loratadine  10 mg Oral Daily  . mouth rinse  15 mL Mouth Rinse BID  . metoprolol succinate  50 mg Oral Daily  . multivitamin with minerals  1 tablet Oral Daily  . pantoprazole  40 mg Oral Daily  . rosuvastatin  40 mg Oral QHS  . sacubitril-valsartan  1 tablet Oral BID  . sodium chloride flush  3 mL Intravenous Q12H  . sodium chloride flush  3 mL Intravenous Q12H   Continuous Infusions: .  sodium chloride Stopped (09/24/20 1321)     LOS: 20 days    Time spent: 15 minutes    Sidney Ace, MD [facility] Pager [phone]  If 7PM-7AM, please contact night-coverage 09/30/2020, 1:15 PM

## 2020-09-30 NOTE — Progress Notes (Signed)
PT Cancellation Note  Patient Details Name: Kathryn Garrett MRN: [id number] DOB: 05-Jan-1946   Cancelled Treatment:     PT attempt. Pt was working with OT when attempted.Pt was up with RN staff to [place]. Will continue to follow and return tomorrow per POC.    Willette Pa 09/30/2020, 4:21 PM

## 2020-10-01 DIAGNOSIS — Z515 Encounter for palliative care: Secondary | ICD-10-CM

## 2020-10-01 DIAGNOSIS — Z7189 Other specified counseling: Secondary | ICD-10-CM | POA: Diagnosis not present

## 2020-10-01 DIAGNOSIS — J441 Chronic obstructive pulmonary disease with (acute) exacerbation: Secondary | ICD-10-CM | POA: Diagnosis not present

## 2020-10-01 LAB — GLUCOSE, CAPILLARY
Glucose-Capillary: 110 mg/dL — ABNORMAL HIGH (ref 70–99)
Glucose-Capillary: 124 mg/dL — ABNORMAL HIGH (ref 70–99)
Glucose-Capillary: 149 mg/dL — ABNORMAL HIGH (ref 70–99)
Glucose-Capillary: 120 mg/dL — ABNORMAL HIGH (ref 70–99)
Glucose-Capillary: 133 mg/dL — ABNORMAL HIGH (ref 70–99)
Glucose-Capillary: 107 mg/dL — ABNORMAL HIGH (ref 70–99)

## 2020-10-01 NOTE — Progress Notes (Addendum)
Daily Progress Note   Patient Name: Kathryn Garrett       Date: 10/01/2020 DOB: [birth year]  Age: 75 y.o. MRN#: [id number] Attending Physician: Sidney Ace, MD Primary Care Physician: Imagene Riches, NP Admit Date: 09/10/2020  Reason for Consultation/Follow-up: Establishing goals of care   Epic chat per RD and primary MD regarding decisions on a feeding tube.   Subjective: Patient is resting in bed at this time. She states the diet is not that bad and she is adjusting to it. Discussed concerns for adequate  nutrition and hydration. She states if needed, she will try to eat and drink more. She remains unsure about her feelings on a PEG tube. Patient begins to focus only on the t.v. and does not seem open to discussing [place] or decisions any further. Would recommend palliative to follow outpatient.    Length of Stay: 21  Current Medications: Scheduled Meds:  . aspirin  81 mg Oral QHS  . budesonide (PULMICORT) nebulizer solution  0.25 mg Nebulization BID  . buprenorphine-naloxone  2 tablet Sublingual Daily  . Chlorhexidine Gluconate Cloth  6 each Topical QHS  . clopidogrel  75 mg Oral QHS  . enoxaparin (LOVENOX) injection  40 mg Subcutaneous Q24H  . fluticasone  1 spray Each Nare Daily  . insulin aspart  0-15 Units Subcutaneous Q4H  . levothyroxine  88 mcg Oral Q0600  . loratadine  10 mg Oral Daily  . mouth rinse  15 mL Mouth Rinse BID  . metoprolol succinate  50 mg Oral Daily  . multivitamin with minerals  1 tablet Oral Daily  . pantoprazole  40 mg Oral Daily  . rosuvastatin  40 mg Oral QHS  . sacubitril-valsartan  1 tablet Oral BID  . sodium chloride flush  3 mL Intravenous Q12H  . sodium chloride flush  3 mL Intravenous Q12H    Continuous Infusions: . sodium chloride  Stopped (09/24/20 1321)    PRN Meds: acetaminophen **OR** acetaminophen, albuterol, artificial tears, docusate, guaiFENesin, polyethylene glycol  Physical Exam Pulmonary:     Effort: Pulmonary effort is normal.  Neurological:     Mental Status: She is alert.             Vital Signs: BP 106/60   Pulse 100   Temp 98.7 F (37.1 C) (Oral)  Resp 20   Ht 4\' 11"  (1.499 m)   Wt 55.1 kg   SpO2 96%   BMI 24.53 kg/m  SpO2: SpO2: 96 % O2 Device: O2 Device: Room Air O2 Flow Rate: O2 Flow Rate (L/min): 1 L/min  Intake/output summary:   Intake/Output Summary (Last 24 hours) at 10/01/2020 1013 Last data filed at 10/01/2020 5956 Gross per 24 hour  Intake 960 ml  Output 75 ml  Net 885 ml   LBM: Last BM Date: 09/30/20 Baseline Weight: Weight: 68.2 kg Most recent weight: Weight: 55.1 kg         Patient Active Problem List   Diagnosis Date Noted  . Acute on chronic HFrEF (heart failure with reduced ejection fraction) ([place])   . Acute respiratory failure ([place]) 09/11/2020  . HFrEF (heart failure with reduced ejection fraction) ([place])   . Acute exacerbation of chronic obstructive pulmonary disease (COPD) ([place]) 09/10/2020  . Severe sepsis ([place]) 09/10/2020  . NSTEMI (non-ST elevated myocardial infarction) ([place]) 09/10/2020  . Hyperglycemia 09/10/2020  . SOB (shortness of breath) 09/10/2020  . Chronic obstructive pulmonary disease ([place]) 03/05/2018  . History of opioid abuse ([place]) 03/05/2018  . Nocturnal hypoxemia 10/30/2017  . Peripheral arterial disease ([place]) 08/30/2016  . Chest pain 06/04/2016  . S/P PTCA (percutaneous transluminal coronary angioplasty) 02/10/16 to RCA lesion for in stent restenois 02/11/2016  . Unstable angina ([place])   . Hyperlipidemia LDL goal <70 01/28/2016  . Presence of coronary angioplasty implant and graft 10/15/2015  . Tobacco abuse 10/15/2015  . CAP (community acquired pneumonia) 09/24/2015  . CAD (coronary artery disease) 09/24/2015  . HTN (hypertension)  09/24/2015  . Lung nodule, solitary 03/31/2014  . Liver nodule 03/31/2014    Palliative Care Assessment & Plan    Recommendations/Plan:  Patient states she will try to eat and drink more. She remains unsure about her feelings on a feeding tube.   Recommend palliative at D/C.     Code Status:    Code Status Orders  (From admission, onward)         Start     Ordered   09/11/20 0044  Full code  Continuous        09/11/20 0044        Code Status History    Date Active Date Inactive Code Status Order ID Comments User Context   09/10/2020 1615 09/11/2020 0044 Full Code [id number]  Rhetta Mura, DO ED   06/04/2016 1751 06/06/2016 1520 Full Code [id number]  Cheryln Manly, NP ED   02/10/2016 0908 02/11/2016 1551 Full Code [id number]  Jettie Booze, MD Inpatient   09/24/2015 2245 10/01/2015 1719 Full Code [id number]  Lance Coon, MD ED   Advance Care Planning Activity       Prognosis:  Poor overall    Care plan was discussed with RN, MD, RD.   Thank you for allowing the Palliative Medicine Team to assist in the care of this patient.   Total Time 15 min Prolonged Time Billed no      Greater than 50%  of this time was spent counseling and coordinating care related to the above assessment and plan.  Asencion Gowda, NP  Please contact Palliative Medicine Team phone at [phone] for questions and concerns.

## 2020-10-01 NOTE — TOC Progression Note (Signed)
Transition of Care [facility]) - Progression Note    Patient Details  Name: Kathryn Garrett MRN: [id number] Date of Birth: 04-21-1946  [facility]) CM/SW Contact  Eileen Stanford, LCSW Phone Number: 10/01/2020, 1:59 PM  Clinical Narrative:   Pt's daughter is doing appeal with [employer] regarding CIR denial. Holland Falling is requesting clinicals stating why pt needs CIR. Clinicals faxed to [phone]        Barriers to Discharge: Continued Medical Work up  Expected Discharge Plan and Services   In-house Referral: Clinical Social Work   Post Acute Care Choice:  [facility]) Living arrangements for the past 2 months: Single Family Home                                       Social Determinants of Health (SDOH) Interventions    Readmission Risk Interventions No flowsheet data found.

## 2020-10-01 NOTE — Progress Notes (Signed)
NAME:  Kathryn Garrett, MRN:  [id number], DOB:  Dec 11, 1945, LOS: 21 ADMISSION DATE:  09/10/2020, CONSULTATION DATE: 09/11/2020 REFERRING MD: Dr. Damita Dunnings, CHIEF COMPLAINT: Shortness of breath  History of Present Illness:  75 year old female presented to the [facility] ED from home with complaints of shortness of breath.  Patient reported 2 to 3 days of progressive shortness of breath and associated new onset nonproductive cough as well as a subjective fever.  Per ED documentation she denied associated chest pain/palpitations/diaphoresis.  And denies any recent nausea/vomiting, chills/rigors/myalgias.  Patient confirmed a history of COPD and current smoking history stating she is not on any supplemental oxygen at baseline.  ED course: Patient received cefepime due to concerns for a left lower lobe infiltrate, 500 mL NS bolus & IV fluids at 150 mL an hour Initial vitals: Afebrile at 98, tachypneic at 22, tachycardic at 108, BP 127/72 & 99% on BiPAP Significant labs: Serum bicarb 15, hyperglycemic at 350, troponin 70 > 745 > 1838, BNP 222.6, VBG revealed metabolic acidosis: 4.09/81/191/47.8, leukocytosis at 40.2, lactic acidosis 2.5> 2.2 > 2.5  TRH hospitalist were consulted for admission.  Around midnight PCCM was called due to patient's deteriorating respiratory status.  Per ED provider Dr. Karma Greaser and care RN the patient complained that " I can't breathe", was given a dose of Ativan and became somnolent on BiPAP.  Coarse crackles auscultated bilaterally, and the decision was made to emergently intubate the patient and place her on mechanical ventilation. PCCM consulted for management  Significant Hospital Events: Including procedures, antibiotic start and stop dates in addition to other pertinent events   . 09/10/2020-patient admits admitted with hospitalist service for COPD exacerbation and left lower lobe pneumonia . 09/11/2020-overnight patient had suspected flash pulmonary edema, dyspnea followed by  somnolence requiring emergent intubation and mechanical ventilation.  Admitted to ICU . 09/12/20- patient failed SBT , she had blood tinged secretions fromETT and aggitation, family was at bedside and we agreed on giving her more time and try again.  . 09/14/20- Failed SBT (became hypoxic with sats mid 80's, increased WOB and assessory muscle use, HR 140-160's). Will diurese today and add Metoprolol.  Tracheal aspirate from 4/30 with KLEBSIELLA PNEUMONIAE (resistant to Ampicillin)   . 09/15/20- Failed SBT (increased WOB and assessory muscle use, RR 40's, HR 140-150's, Hypertensive); plan to add scheduled PO Klonopin and Oxycodone, Lasix 40 mg IV x1, consult Palliative Care . 09/16/20- Worsening Leukocytosis up to 21.7, low grade fever overnight, increased secretions overnight, will repeat Tracheal aspirate, remove central line, add mucinex, plan for SBT . 09/17/20-[name] passed SBT in AM and following commands still having some moderate secretions . 09/18/20-  Extubated yesterday.  Failed swallow eval today.  . 09/20/20- Weaned to room air, Leukocytosis improving; Hemodynamically stable . 09/27/20- patient is coughing up phlegm but clinically is improved, we discussed pneumonia and COPD with outpatient clinic follow up with pulmonology.  There is plan for post dc rehab . 09/28/20- patient is improved and she is being optimized for CIR, pulmonary will sign off and can follow up on outpatient basis . 09/29/20- family at bedside today.  We discussed barium swallow with aspiration.  . 09/30/20- patient had PT today she walked today without need of additional supplemental O2 therapy.  She generally has been walking ok but has had severe LE weakness here.  She ate lunch infront of me had cough after couple bites.  We disucssed aspiartion risk and her barium swallow.  10/01/20- patient is in stable chronic  state , reviewed care plan with attending physician Dr Priscella Mann this am. PCCM will sign off at this time and are available  if needed.     Objective   Blood pressure 106/60, pulse 100, temperature 98.7 F (37.1 C), temperature source Oral, resp. rate 20, height 4\' 11"  (3.976 m), weight 55.1 kg, SpO2 96 %.        Intake/Output Summary (Last 24 hours) at 10/01/2020 1128 Last data filed at 10/01/2020 1015 Gross per 24 hour  Intake 1200 ml  Output 75 ml  Net 1125 ml   Filed Weights   09/29/20 0700 09/30/20 0346 10/01/20 0508  Weight: 57.6 kg 56.5 kg 55.1 kg    Examination: General: Acute on chronically ill-appearing female, laying in bed, breathing treatment and chest PT via bed currently in place, in no acute distress HEENT: Atraumatic, normocephalic, neck supple, no JVD Neuro: Awake, alert and oriented x3, moves all extremities to command (generalized weakness), no focal deficits, speech clear CV: Tachycardia, regular rate and rhythm with occasional PACs, S1-S2, no murmurs, rubs, gallops, 2+ distal pulses Pulm: Coarse breath sounds bilaterally, no wheezing or rales noted, even, nonlabored GI: Soft, nontender, nondistended, no guarding or rebound tenderness, bowel sounds positive x4 Skin: Limited exam-warm and dry.  No obvious rashes, lesions, ulcerations Extremities: Warm and dry.  No deformities, minimal edema   Labs/imaging that I have personally reviewed  (right click and "Reselect all SmartList Selections" daily)  Labs 09/20/2020: WBC 20.1, K 3.0, glucose 124, BUN 68, Cr. 0.66 Chest x-ray 09/16/2020>>Endotracheal tube, NG tube, right IJ line in stable position. Heart size normal. Persistent but improved bilateral interstitial infiltrates. Tiny left pleural effusion cannot be excluded. No pneumothorax. MRI brain 09/13/2020:No acute brain finding. Mild chronic small-vessel ischemic change of the white matter, fairly typical for age.  Assessment & Plan:   Acute hypoxic respiratory failure secondary to Acute COPD Exacerbation & left lung multifocal pneumonia -S/p Extubation -Supplemental O2 as needed  to maintain O2 sats 88 to 94% -Follow intermittent CXR & ABG as needed -Bronchodilators -Continue Budesonide nebs -Completed 7 days course of Cefepime/Ancef -Aggressive pulmonary toilet -PT/OT  Sepsis due to left lung multifocal pneumonia>> tracheal aspirate on 4/30 with KLEBSIELLA PNEUMONIAE (resistant to Ampicillin)>>treated -Monitor fever curve -Trend WBCs.  Elevated WBC in part related to steroids. -Follow cultures as above -Completed 7 days of Cefepime/Ancef  Acute systolic congestive heart failure Acute non-ST elevation MI New diagnosis of A. fib with RVR -Continuous cardiac monitoring -Patient's ejection fraction on echocardiogram is 30% -Diuresis as renal function and BP permits >> currently on 40 mg IV Lasix daily and will continue -Cardiology following, input is appreciated -Milrinone has been tapered off -Patient completed therapy with IV heparin -Continue aspirin Plavix and statin -She will need left heart cath once stable -Metoprolol BID per tube  Acute metabolic/toxic encephalopathy>>improving -Improved now that she is extubated and off continuous sedation -stop scheduled Klonopin as unable to take p.o. -Was on buprenorphine/naloxone as an outpatient which is now held ~ continue scheduled Oxycodone per tube -Provide supportive care -CT Head at admission negative -MRI Brain 09/13/20 negative -Continue Aspirin, Plavix, Atorvastatin   Best practice (right click and "Reselect all SmartList Selections" daily)  Diet:  NPO.  Repeat swallow evaluation to be performed on 09/20/2020 Pain/Anxiety/Delirium protocol (if indicated): Oxycodone VAP protocol (if indicated): Now extubated. DVT prophylaxis: SCD (chemical prophylaxis held due to previous hemoptysis) GI prophylaxis: H2B Glucose control:  SSI Yes Central venous access: n/a Arterial line:  N/A Foley: N/A  Mobility: Out of bed with assistance as tolerated PT consulted: needs Pt/OT Code Status:  full  code Disposition: Stepdown   Labs   CBC: Recent Labs  Lab 09/26/20 0404 09/27/20 0457 09/28/20 0434 09/29/20 0442 09/30/20 0430  WBC 17.8* 16.0* 10.8* 12.2* 8.4  NEUTROABS 12.0* 11.1* 5.8 7.3 4.6  HGB 13.9 13.3 11.9* 12.1 10.9*  HCT 44.3 40.8 38.3 38.2 34.8*  MCV 95.9 94.7 95.8 96.2 94.6  PLT 336 346 339 372 546    Basic Metabolic Panel: Recent Labs  Lab 09/25/20 0328 09/26/20 0328 09/27/20 0457 09/30/20 0430  NA 137 135 136 137  K 3.7 3.8 3.5 3.4*  CL 106 105 105 106  CO2 23 20* 22 24  GLUCOSE 87 103* 117* 109*  BUN 31* 32* 30* 20  CREATININE 0.53 0.52 0.48 0.56  CALCIUM 9.0 8.8* 8.8* 8.5*   GFR: Estimated Creatinine Clearance: 46 mL/min (by C-G formula based on SCr of 0.56 mg/dL). Recent Labs  Lab 09/27/20 0457 09/28/20 0434 09/29/20 0442 09/30/20 0430  WBC 16.0* 10.8* 12.2* 8.4    Liver Function Tests: No results for input(s): AST, ALT, ALKPHOS, BILITOT, PROT, ALBUMIN in the last 168 hours. No results for input(s): LIPASE, AMYLASE in the last 168 hours. No results for input(s): AMMONIA in the last 168 hours.  ABG    Component Value Date/Time   PHART 7.38 09/15/2020 0500   PCO2ART 41 09/15/2020 0500   PO2ART 123 (H) 09/15/2020 0500   HCO3 33.5 (H) 09/17/2020 0944   TCO2 23 01/14/2009 0327   ACIDBASEDEF 0.8 09/15/2020 0500   O2SAT 88.8 09/17/2020 0944     Coagulation Profile: No results for input(s): INR, PROTIME in the last 168 hours.  Cardiac Enzymes: No results for input(s): CKTOTAL, CKMB, CKMBINDEX, TROPONINI in the last 168 hours.  HbA1C: Hgb A1c MFr Bld  Date/Time Value Ref Range Status  09/11/2020 04:37 AM 6.4 (H) 4.8 - 5.6 % Final    Comment:    (NOTE) Pre diabetes:          5.7%-6.4%  Diabetes:              >6.4%  Glycemic control for   <7.0% adults with diabetes   10/02/2019 12:28 PM 6.4 (H) 4.8 - 5.6 % Final    Comment:             Prediabetes: 5.7 - 6.4          Diabetes: >6.4          Glycemic control for adults  with diabetes: <7.0     CBG: Recent Labs  Lab 09/30/20 1528 09/30/20 2040 09/30/20 2355 10/01/20 0513 10/01/20 0750  GLUCAP 78 153* 104* 110* 124*      Ottie Glazier, M.D.  Pulmonary & [place]

## 2020-10-01 NOTE — Progress Notes (Signed)
Physical Therapy Treatment Patient Details Name: RAMONITA KOENIG MRN: [id number] DOB: June 28, 1945 Today's Date: 10/01/2020    History of Present Illness 75 y.o. female with history of CAD with VF arrest s/p PCI to the RCA in 2017 s/p PTCA to the RCA for ISR in 2018, chronic combined systolic and diastolic CHF, COPD with ongoing tobacco use, HTN, HLD, anemia, and narcotic abuse on Suboxone therapy admitted with acute hypoxic respiratory failure requiring mechanical ventilation (extubated 09/17/2020) secondary to COPD exacerbation Klebsiella PNA and acute on chronic combined CHF with transient ST elevation and echo demonstrating new LV dysfunction.    PT Comments    Pt was sitting in recliner talking on phone upon arriving. She agrees to session and si cooperative and pleasnat throughout. Was able to stand, and ambulate 40 ft with RW + slow  cadence,. Does have unsteadiness in which required min assist at times to prevent fall. Pt does not use RW at baseline. HR elevated to 133 bpm at peak. Highly recommend DC to rehab to address deficits while maximizing independence and activity tolerance with ADLs.    Follow Up Recommendations  Supervision/Assistance - 24 hour;CIR     Equipment Recommendations  None recommended by PT       Precautions / Restrictions Precautions Precautions: Fall Restrictions Weight Bearing Restrictions: No    Mobility  Bed Mobility    General bed mobility comments: Pt was in recliner pre/post session    Transfers Overall transfer level: Needs assistance Equipment used: Rolling walker (2 wheeled) Transfers: Sit to/from Stand Sit to Stand: Min guard         General transfer comment: CGA to stand from recliner. does continue to require Vcs for hand placement and improved technique.  Ambulation/Gait Ambulation/Gait assistance: Min assist;Min guard Gait Distance (Feet): 40 Feet Assistive device: Rolling walker (2 wheeled) Gait Pattern/deviations: Step-to  pattern;Staggering right (staggers post) Gait velocity: decreased   General Gait Details: pt was able to ambulate ~ 40 ft with CGA mostly however does have LOB posteriorly and R. intervention required to prevent fall. HR elevated to 133 BPM at peak       Balance Overall balance assessment: Needs assistance Sitting-balance support: No upper extremity supported;Feet unsupported Sitting balance-Leahy Scale: Fair     Standing balance support: During functional activity;Bilateral upper extremity supported Standing balance-Leahy Scale: Fair Standing balance comment: reliant on BUE support. pt is very high fall risk       Cognition Arousal/Alertness: Awake/alert Behavior During Therapy: WFL for tasks assessed/performed Overall Cognitive Status: Within Functional Limits for tasks assessed      General Comments: Pt is A and O x 4. Cooperative and pleasant throughout         General Comments General comments (skin integrity, edema, etc.): HR elevated to 133bpm at peak with ambulation 40 ft      Pertinent Vitals/Pain Pain Assessment: No/denies pain Pain Score: 0-No pain Pain Location: headache Pain Descriptors / Indicators: Aching Pain Intervention(s): Limited activity within patient's tolerance;Monitored during session;Premedicated before session           PT Goals (current goals can now be found in the care plan section) Acute Rehab PT Goals Patient Stated Goal: to return to work Progress towards PT goals: Progressing toward goals    Frequency    7X/week      PT Plan Current plan remains appropriate    Co-evaluation     PT goals addressed during session: Mobility/safety with mobility;Balance;Proper use of DME;Strengthening/ROM  AM-PAC PT "6 Clicks" Mobility   Outcome Measure  Help needed turning from your back to your side while in a flat bed without using bedrails?: A Little Help needed moving from lying on your back to sitting on the side of a flat  bed without using bedrails?: A Little Help needed moving to and from a bed to a chair (including a wheelchair)?: A Little Help needed standing up from a chair using your arms (e.g., wheelchair or bedside chair)?: A Little Help needed to walk in hospital room?: A Little Help needed climbing 3-5 steps with a railing? : A Lot 6 Click Score: 17    End of Session Equipment Utilized During Treatment: Gait belt Activity Tolerance: Patient limited by fatigue Patient left: in chair;with call bell/phone within reach;with chair alarm set Nurse Communication: Mobility status PT Visit Diagnosis: Difficulty in walking, not elsewhere classified (R26.2);Muscle weakness (generalized) (M62.81);Unsteadiness on feet (R26.81)     Time: 4580-9983 PT Time Calculation (min) (ACUTE ONLY): 20 min  Charges:  $Gait Training: 8-22 mins                     Julaine Fusi PTA 10/01/20, 4:44 PM

## 2020-10-01 NOTE — Progress Notes (Signed)
Occupational Therapy Treatment Patient Details Name: Kathryn Garrett MRN: [id number] DOB: 02-05-1946 Today's Date: 10/01/2020    History of present illness 75 y.o. female with history of CAD with VF arrest s/p PCI to the RCA in 2017 s/p PTCA to the RCA for ISR in 2018, chronic combined systolic and diastolic CHF, COPD with ongoing tobacco use, HTN, HLD, anemia, and narcotic abuse on Suboxone therapy admitted with acute hypoxic respiratory failure requiring mechanical ventilation (extubated 09/17/2020) secondary to COPD exacerbation Klebsiella PNA and acute on chronic combined CHF with transient ST elevation and echo demonstrating new LV dysfunction.   OT comments  Pt seen for OT tx this date to f/u re: safety with ADLs/ADL mobility. OT engages pt in bathign and dressing tasks seated EOB with SETUP for UB and MIN A for LB in standing with RW for support. Next, OT engages pt in SPS with MIN A from bed to chair. Pt with improved HR noted this session, only peaking at 120 bpm with transfer to chair and standing ADL tasks. Pt left in chair with chair alarm. RN and CNA notified. Will continue to follow.   Follow Up Recommendations  CIR    Equipment Recommendations  3 in 1 bedside commode;Tub/shower seat    Recommendations for Other Services      Precautions / Restrictions Precautions Precautions: Fall Restrictions Weight Bearing Restrictions: No       Mobility Bed Mobility Overal bed mobility: Needs Assistance Bed Mobility: Supine to Sit     Supine to sit: Min assist     General bed mobility comments: increased time, MIN A from flattened bed    Transfers Overall transfer level: Needs assistance Equipment used: Rolling walker (2 wheeled) Transfers: Sit to/from [facility] Sit to Stand: Min assist Stand pivot transfers: Min assist       General transfer comment: increased time, cues for safe hand placement    Balance Overall balance assessment: Needs  assistance Sitting-balance support: No upper extremity supported;Feet unsupported Sitting balance-Leahy Scale: Fair Sitting balance - Comments: G static sitting, F/P dynamic for reaching outside BOS   Standing balance support: During functional activity;Bilateral upper extremity supported Standing balance-Leahy Scale: Fair Standing balance comment: requires UE support on RW                           ADL either performed or assessed with clinical judgement   ADL Overall ADL's : Needs assistance/impaired     Grooming: Wash/dry face;Oral care;Applying deodorant;Set up;Sitting   Upper Body Bathing: Minimal assistance;Sitting   Lower Body Bathing: Minimal assistance;Sit to/from stand Lower Body Bathing Details (indicate cue type and reason): MIN A in standing for posterior LB bathing d/t decreased standing balance for more dynamic tasks.                             Vision Patient Visual Report: No change from baseline     Perception     Praxis      Cognition Arousal/Alertness: Awake/alert Behavior During Therapy: WFL for tasks assessed/performed Overall Cognitive Status: Within Functional Limits for tasks assessed                                 General Comments: pt with some slow processing occasionally, some delays with recalling new information. But pt is oriented and apporiate throughout  Exercises Other Exercises Other Exercises: OT engages pt in bathign and dressing tasks seated EOB with SETUP for UB and MIN A for LB in standing with RW for support. Next, OT engages pt in  SPS with MIN A from bed to chair.   Shoulder Instructions       General Comments pt HR more stable this date. Pt HR up to 120 at peak with transfer from bed to chair. Tolerates ~4 min stand while completing LB ADLs with MIN A with HR maintaining ~105-115bpm    Pertinent Vitals/ Pain       Pain Assessment: 0-10 Pain Score: 2  Pain Location: raw  bottom Pain Descriptors / Indicators: Tender Pain Intervention(s): Monitored during session;Repositioned;Other (comment) (assisted pt to bathe and apply barrier ointment to peri area.)  Home Living                                          Prior Functioning/Environment              Frequency  Min 3X/week        Progress Toward Goals  OT Goals(current goals can now be found in the care plan section)  Progress towards OT goals: Progressing toward goals  Acute Rehab OT Goals Patient Stated Goal: to return to work OT Goal Formulation: With patient/family Time For Goal Achievement: 10/14/20 Potential to Achieve Goals: Good  Plan Discharge plan remains appropriate;Frequency remains appropriate    Co-evaluation        PT goals addressed during session: Mobility/safety with mobility;Balance;Proper use of DME;Strengthening/ROM        AM-PAC OT "6 Clicks" Daily Activity     Outcome Measure   Help from another person eating meals?: A Little Help from another person taking care of personal grooming?: A Little Help from another person toileting, which includes using toliet, bedpan, or urinal?: A Little Help from another person bathing (including washing, rinsing, drying)?: A Little Help from another person to put on and taking off regular upper body clothing?: A Little Help from another person to put on and taking off regular lower body clothing?: A Lot 6 Click Score: 17    End of Session Equipment Utilized During Treatment: Rolling walker  OT Visit Diagnosis: Unsteadiness on feet (R26.81);Muscle weakness (generalized) (M62.81);Other symptoms and signs involving cognitive function   Activity Tolerance Patient tolerated treatment well   Patient Left with call bell/phone within reach;with chair alarm set   Nurse Communication Mobility status        Time: 9024-0973 OT Time Calculation (min): 29 min  Charges: OT General Charges $OT Visit: 1  Visit OT Treatments $Self Care/Home Management : 23-37 mins  Gerrianne Scale, [place], OTR/L ascom [phone] 10/01/20, 5:03 PM

## 2020-10-01 NOTE — Progress Notes (Signed)
PT Cancellation Note  Patient Details Name: Kathryn Garrett MRN: [id number] DOB: 1945-12-16   Cancelled Treatment:     PT attempt. Pt long sitting in bed and requesting author return later in day. Pt was c/o severe HA and requesting pain medications for it. RN notified. Will return later this date and continue to follow per POC.   Willette Pa 10/01/2020, 11:09 AM

## 2020-10-01 NOTE — Progress Notes (Addendum)
Speech Language Pathology Treatment: Dysphagia  Patient Details Name: Kathryn Garrett MRN: [id number] DOB: [birth year] Today's Date: 10/01/2020 Time: 1135-1220 SLP Time Calculation (min) (ACUTE ONLY): 45 min  Assessment / Plan / Recommendation Clinical Impression  Pt seen for ongoing assessment of swallowing and toleration of diet; education re: swallowing, Esophageal dysmotility and impact on swallowing, as well as practice w/ the swallowing strategies recommended from MBSS. Further education was given on diet consistencyincluding need for HONEY consistency liquids and general aspiration and REFLUX precautions.No family present in room.   Discussed w/ pt the results of the MBSS indicatingprimarypharyngeal phase dysphagia, especially thedelayed pharyngeal swallow initiation and reduced airway closure resulting in SILENT aspiration of thin and Nectar liquids. Minpharyngeal phase dysmotility resulting pharyngeal residuerequiring a f/u, Dry swallow to clear residue but w/ risk for aspiration of that as well. Discussed the use of swallowing strategies to aid in reducing the pharyngeal residue but also the risk for aspiration: Chin Tuck and f/u Dry swallow w/ each bite/sip. Discussed the delayed pharyngeal swallow initiation during trials of thinand Nectarliquids and the SILENT laryngeal penetration/aspiration noted during the study -- significance of SILENT nature of the penetration/aspiration and risk for Pulmonary decline. Pt also appeared to present w/Esophageal dysmotility during the MBSS; she has a h/o REFLUX and c/o"some"Esophageal dysmotility/discomfort especially"acid reflux" for which she takes TUMS at home. ANY Esophageal phase deficits and/or GERDretrograde activitycan impact the oropharyngeal phases of swallowing (such as impact of LPR) which can then impact the timing of the pharyngeal swallow thus increase risk for(silent)aspiration to occur. ANY Reflux material or retrograde  activity can increase risk for aspiration of Reflux material thus impact Pulmonary status as well as increase presentation of a "cough" during/post meals. Recommend pt f/u w/GIfor further assessment and management of Dysmotility,and Education.Pt stated she has been talked w/ re: option of PEG placement for supporting her needs; she is Not ready to make a decision about that at this time.  Discussed and Practiced general aspiration precautionsswallowing strategies of f/u, Dry swallow andChin Tuck -- encouraged a Cough intermittently also to aid airway protection. Pt verbally repeated the strategies and exhibited adequate follow through w/ strategies w/ min verbal cues during po trials of Honey liquids via Cup, ~3 oz.Pt stated she was "working on" her hydration and drinking more of the Honey liquids. Discussed ways to treat them to make them more appealing including making Lemonade w/ the Honey waters, lemon, sugar and mixing flavors. Pt stated she would try them w/ NSG later. Pt was able to state the importance of Hydration w/ re: to her HR and being able to stay safely hydrated for going home/healing -- working w/ OT/PT.   Handouts on diet consistency recommended as well as aspiration precautions and strategiesposted in Room and given to pt;thorough education w/ pt on aspiration precautions; swallowing strategies; diet consistency including foods/liquids; thickened liquids(Honeyconsistency) and preparation/ordering/supplies. Also discussed REFLUX precautions.   Recommend continue a Dysphagia level3(mincedmeats,moistenedmech soft foods) diet w/Honey consistencyliquids Via Cup;chin tuck; f/u, Dry swallow;aspiration precautions; REFLUX precautions; Pill in Puree for safer swallowing. Encourage hydration throughout the day. Recommend ST servicesto f/u at dischargeat next venue of care/REHABw/ ongoing dysphagia tx and f/u MBSS in ~2-4 weeks, hopefully post GI consultation and f/u for  Esophageal dysmotility.Pt and Daughter agreed w/ the above;NSG agreed.    HPI HPI: Pt is a 75 yo female admitted for COPD exacerbation and left lower lobe pneumonia. Tracheal aspirate from 4/30 with KLEBSIELLA PNEUMONIAE (resistant to Ampicillin). Head CT 09/17/2020 No acute  intracranial abnormality.Past medical history including HRrEF, tobacco abuse, CAD s/p stenting, CHF, COPD who is admitted with COPD exacerbation / pneumonia / decompensated heart failure / NSTEMI.  Pt intubated 09/11/2020 thru 09/17/2020. Extubated then w/ NG placed for meds, po's.  MBSS completed on 09/27/2020 revealing pharyngeal phase dysphagia w/ SILENT Aspiration; also noted Esophageal phase Dysmotility w/ bolus stasis and retrograde backflow b/t trials.      SLP Plan  Continue with current plan of care       Recommendations  Diet recommendations: Dysphagia 3 (mechanical soft);Honey-thick liquid (chopped meats) Liquids provided via: Cup;No straw Medication Administration: Whole meds with puree (for safer swallowing) Supervision: Patient able to self feed;Intermittent supervision to cue for compensatory strategies Compensations: Minimize environmental distractions;Slow rate;Small sips/bites;Lingual sweep for clearance of pocketing;Multiple dry swallows after each bite/sip;Clear throat intermittently;Chin tuck (Cough at end of po's/meal) Postural Changes and/or Swallow Maneuvers: Out of bed for meals;Seated upright 90 degrees;Upright 30-60 min after meal                General recommendations:  (Dietician f/u) Oral Care Recommendations: Oral care BID;Oral care before and after PO;Patient independent with oral care Follow up Recommendations: Skilled Nursing facility (vs Home Health SLP) SLP Visit Diagnosis: Dysphagia, pharyngeal phase (R13.13);Dysphagia, pharyngoesophageal phase (R13.14) Plan: Continue with current plan of care       Vandiver, Anna, CCC-SLP Speech Language  Pathologist Rehab Services [phone] [facility] 10/01/2020, 1:58 PM

## 2020-10-01 NOTE — Progress Notes (Signed)
   10/01/20 1130  Clinical Encounter Type  Visited With Patient and family together  Visit Type Initial;Spiritual support;Social support  Referral From Chaplain  Consult/Referral To [employer] visited PT while doing his rounds. A family member was at bedside. Chaplain explored emotions with PT. PT stated she was ready to get home and that there was no place like home. Chaplain supported PT in her emotions. PT spoke of her faith in [place] and that her Doristine Bosworth would be by to see her later on. PT seemed interested in visit but chaplain ended visit because she had someone at bedside. Chaplain will try to follow up.

## 2020-10-01 NOTE — Progress Notes (Signed)
PROGRESS NOTE    Kathryn Garrett  YIF:[id number] DOB: [birth year] DOA: 09/10/2020 PCP: Imagene Riches, NP   Brief Narrative:  74/F presented to the [facility] ED from home with complaints of shortness of breath -Admitted on 4/29 with hypoxic respiratory failure secondary to COPD exacerbation and community-acquired pneumonia [facility] course was complicated by flash pulmonary edema and NSTEMI requiring mechanical ventilation, intubated on 4/30  Post extubation patient continued to slowly improve.  Initially was slated for CIR however unfortunately insurance denied.  Currently bed search initiated for skilled nursing facility.  Patient remains medically stable for discharge.  Received notification of case management that SNF had been denied by patient's insurance.  Appeal currently in progress.   Assessment & Plan:   Principal Problem:   Acute exacerbation of chronic obstructive pulmonary disease (COPD) (HCC) Active Problems:   CAP (community acquired pneumonia)   HTN (hypertension)   Tobacco abuse   Hyperlipidemia LDL goal <70   Severe sepsis (HCC)   NSTEMI (non-ST elevated myocardial infarction) (HCC)   Hyperglycemia   SOB (shortness of breath)   Acute respiratory failure (HCC)   HFrEF (heart failure with reduced ejection fraction) (HCC)   Acute on chronic HFrEF (heart failure with reduced ejection fraction) (HCC)  Acute hypoxic respiratory failure  Acute COPD Exacerbation/left lung multifocal pneumonia Flash pulmonary edema on 4/30 -Intubated for flash pulmonary edema on, extubated 5/7 -Clinically improving, completed antibiotic course -Treated with diuretics as well, now off -Improved and stable, weaned off O2 -Discharge planning, CIR declined -Now bed search initiated for skilled nursing facility -[facility] aware and is following for placement options -Patient has had a complicated hospital course and is significantly debilitated.  In my opinion she is unsafe to return home.  She  does have recovery potential and would benefit from standard short-term rehab.  Per case management appeal process is in works.  Sepsis, left lower lobe pneumonia Dysphagia -tracheal aspirate on 4/30 with KLEBSIELLA PNEUMONIAE (resistant to Ampicillin) -Completed 7 days of antibiotics. -Improving -SLP following, continues to have dysphagia, currently on dysphagia 3, honey thick liquids -Will need close SLP FU at rehab to monitor for continued improvement and changes to diet -Currently on honey thick liquids.  Intermittently tolerating p.o. intake.  Acute systolic congestive heart failure/Flash pulmonary edema NSTEMI Paroxysmal SVT -Echo noted with drop in EF down to 30%  -Briefly required inotropes in the ICU, this was then weaned off -Cardiology consulted, left heart cath noted nonobstructive coronary disease, recommended medical management and repeat echo in 2 to 3 months -Continueaspirin, Plavix, metoprolol, Entresto -Clinically euvolemic at this time -Follow-up with Dr. Irish Lack in 2 to 3 weeks  Acute metabolic/toxic encephalopathy -ICU stay complicated by severe encephalopathy while on vent -At baseline on buprenorphine/naloxone which was held on admission, now resumed -Also had an MRI brain which was unremarkable -Improved and stable now, PT OT as tolerated -CIR recommended for rehab.  Declined.  SNF bed search initiated  COPD Ongoing tobacco abuse -Counseled, tapered off prednisone, continue duo nebs   DVT prophylaxis: SQ Lovenox Code Status: Full Family Communication: None today Disposition Plan: Status is: Inpatient  Remains inpatient appropriate because:Unsafe d/c plan   Dispo: The patient is from: Home              Anticipated d/c is to: SNF              Patient currently is medically stable to d/c.   Difficult to place patient No  Initial plan for CIR.  Insurance declined.  Case management informed that skilled nursing facility is also been declined.   Appeal process in progress.     Level ofcare: Progressive Cardiac  Consultants:   Palliative care  Procedures:   None  Antimicrobials:   None   Subjective: Patient seen and examined.  Appears fatigued.  Sitting up in bed.  Tolerating small amounts of p.o. intake.  Objective: Vitals:   10/01/20 0720 10/01/20 0739 10/01/20 0955 10/01/20 1132  BP:  (!) 111/53 106/60 (!) 124/53  Pulse:  99 100 98  Resp:  20  18  Temp:  98.7 F (37.1 C)  98.3 F (36.8 C)  TempSrc:  Oral  Oral  SpO2: 95% 97% 96% 97%  Weight:      Height:        Intake/Output Summary (Last 24 hours) at 10/01/2020 1334 Last data filed at 10/01/2020 1015 Gross per 24 hour  Intake 1200 ml  Output 75 ml  Net 1125 ml   Filed Weights   09/29/20 0700 09/30/20 0346 10/01/20 0508  Weight: 57.6 kg 56.5 kg 55.1 kg    Examination:  General exam: Appears calm and comfortable.  Appears fatigued Respiratory system: Clear to auscultation. Respiratory effort normal. Cardiovascular system: S1 & S2 heard, RRR. No JVD, murmurs, rubs, gallops or clicks. No pedal edema. Gastrointestinal system: Abdomen is nondistended, soft and nontender. No organomegaly or masses felt. Normal bowel sounds heard. Central nervous system: Alert and oriented. No focal neurological deficits. Extremities: Symmetric 5 x 5 power. Skin: No rashes, lesions or ulcers Psychiatry: Judgement and insight appear normal. Mood & affect appropriate.     Data Reviewed: I have personally reviewed following labs and imaging studies  CBC: Recent Labs  Lab 09/26/20 0404 09/27/20 0457 09/28/20 0434 09/29/20 0442 09/30/20 0430  WBC 17.8* 16.0* 10.8* 12.2* 8.4  NEUTROABS 12.0* 11.1* 5.8 7.3 4.6  HGB 13.9 13.3 11.9* 12.1 10.9*  HCT 44.3 40.8 38.3 38.2 34.8*  MCV 95.9 94.7 95.8 96.2 94.6  PLT 336 346 339 372 606   Basic Metabolic Panel: Recent Labs  Lab 09/25/20 0328 09/26/20 0328 09/27/20 0457 09/30/20 0430  NA 137 135 136 137  K 3.7  3.8 3.5 3.4*  CL 106 105 105 106  CO2 23 20* 22 24  GLUCOSE 87 103* 117* 109*  BUN 31* 32* 30* 20  CREATININE 0.53 0.52 0.48 0.56  CALCIUM 9.0 8.8* 8.8* 8.5*   GFR: Estimated Creatinine Clearance: 46 mL/min (by C-G formula based on SCr of 0.56 mg/dL). Liver Function Tests: No results for input(s): AST, ALT, ALKPHOS, BILITOT, PROT, ALBUMIN in the last 168 hours. No results for input(s): LIPASE, AMYLASE in the last 168 hours. No results for input(s): AMMONIA in the last 168 hours. Coagulation Profile: No results for input(s): INR, PROTIME in the last 168 hours. Cardiac Enzymes: No results for input(s): CKTOTAL, CKMB, CKMBINDEX, TROPONINI in the last 168 hours. BNP (last 3 results) No results for input(s): PROBNP in the last 8760 hours. HbA1C: No results for input(s): HGBA1C in the last 72 hours. CBG: Recent Labs  Lab 09/30/20 2040 09/30/20 2355 10/01/20 0513 10/01/20 0750 10/01/20 1136  GLUCAP 153* 104* 110* 124* 149*   Lipid Profile: No results for input(s): CHOL, HDL, LDLCALC, TRIG, CHOLHDL, LDLDIRECT in the last 72 hours. Thyroid Function Tests: No results for input(s): TSH, T4TOTAL, FREET4, T3FREE, THYROIDAB in the last 72 hours. Anemia Panel: No results for input(s): VITAMINB12, FOLATE, FERRITIN, TIBC, IRON, RETICCTPCT in the last 72 hours. Sepsis  Labs: No results for input(s): PROCALCITON, LATICACIDVEN in the last 168 hours.  No results found for this or any previous visit (from the past 240 hour(s)).       Radiology Studies: No results found.      Scheduled Meds: . aspirin  81 mg Oral QHS  . budesonide (PULMICORT) nebulizer solution  0.25 mg Nebulization BID  . buprenorphine-naloxone  2 tablet Sublingual Daily  . Chlorhexidine Gluconate Cloth  6 each Topical QHS  . clopidogrel  75 mg Oral QHS  . enoxaparin (LOVENOX) injection  40 mg Subcutaneous Q24H  . fluticasone  1 spray Each Nare Daily  . insulin aspart  0-15 Units Subcutaneous Q4H  .  levothyroxine  88 mcg Oral Q0600  . loratadine  10 mg Oral Daily  . mouth rinse  15 mL Mouth Rinse BID  . metoprolol succinate  50 mg Oral Daily  . multivitamin with minerals  1 tablet Oral Daily  . pantoprazole  40 mg Oral Daily  . rosuvastatin  40 mg Oral QHS  . sacubitril-valsartan  1 tablet Oral BID  . sodium chloride flush  3 mL Intravenous Q12H  . sodium chloride flush  3 mL Intravenous Q12H   Continuous Infusions: . sodium chloride Stopped (09/24/20 1321)     LOS: 21 days    Time spent: 15 minutes    Sidney Ace, MD [facility] Pager [phone]  If 7PM-7AM, please contact night-coverage 10/01/2020, 1:34 PM

## 2020-10-02 ENCOUNTER — Inpatient Hospital Stay: Payer: Medicare HMO

## 2020-10-02 DIAGNOSIS — I471 Supraventricular tachycardia: Secondary | ICD-10-CM

## 2020-10-02 DIAGNOSIS — I5041 Acute combined systolic (congestive) and diastolic (congestive) heart failure: Secondary | ICD-10-CM

## 2020-10-02 DIAGNOSIS — J9601 Acute respiratory failure with hypoxia: Secondary | ICD-10-CM | POA: Diagnosis not present

## 2020-10-02 DIAGNOSIS — J441 Chronic obstructive pulmonary disease with (acute) exacerbation: Secondary | ICD-10-CM | POA: Diagnosis not present

## 2020-10-02 DIAGNOSIS — Z515 Encounter for palliative care: Secondary | ICD-10-CM | POA: Diagnosis not present

## 2020-10-02 DIAGNOSIS — E7800 Pure hypercholesterolemia, unspecified: Secondary | ICD-10-CM

## 2020-10-02 DIAGNOSIS — Z7189 Other specified counseling: Secondary | ICD-10-CM | POA: Diagnosis not present

## 2020-10-02 LAB — BASIC METABOLIC PANEL
Anion gap: 7 (ref 5–15)
BUN: 10 mg/dL (ref 8–23)
CO2: 27 mmol/L (ref 22–32)
Calcium: 8.5 mg/dL — ABNORMAL LOW (ref 8.9–10.3)
Chloride: 104 mmol/L (ref 98–111)
Creatinine, Ser: 0.49 mg/dL (ref 0.44–1.00)
GFR, Estimated: >60 mL/min (ref >60)
Glucose, Bld: 110 mg/dL — ABNORMAL HIGH (ref 70–99)
Potassium: 3.3 mmol/L — ABNORMAL LOW (ref 3.5–5.1)
Sodium: 138 mmol/L (ref 135–145)

## 2020-10-02 LAB — GLUCOSE, CAPILLARY
Glucose-Capillary: 104 mg/dL — ABNORMAL HIGH (ref 70–99)
Glucose-Capillary: 110 mg/dL — ABNORMAL HIGH (ref 70–99)
Glucose-Capillary: 116 mg/dL — ABNORMAL HIGH (ref 70–99)
Glucose-Capillary: 136 mg/dL — ABNORMAL HIGH (ref 70–99)
Glucose-Capillary: 142 mg/dL — ABNORMAL HIGH (ref 70–99)
Glucose-Capillary: 151 mg/dL — ABNORMAL HIGH (ref 70–99)
Glucose-Capillary: 165 mg/dL — ABNORMAL HIGH (ref 70–99)
Glucose-Capillary: 93 mg/dL (ref 70–99)
Glucose-Capillary: 98 mg/dL (ref 70–99)

## 2020-10-02 LAB — CBC WITH DIFFERENTIAL/PLATELET
Abs Immature Granulocytes: 0.08 10*3/uL — ABNORMAL HIGH (ref 0.00–0.07)
Basophils Absolute: 0.1 10*3/uL (ref 0.0–0.1)
Basophils Relative: 0 %
Eosinophils Absolute: 0 10*3/uL (ref 0.0–0.5)
Eosinophils Relative: 0 %
HCT: 37 % (ref 36.0–46.0)
Hemoglobin: 12.2 g/dL (ref 12.0–15.0)
Immature Granulocytes: 1 %
Lymphocytes Relative: 7 %
Lymphs Abs: 1.1 10*3/uL (ref 0.7–4.0)
MCH: 30.3 pg (ref 26.0–34.0)
MCHC: 33 g/dL (ref 30.0–36.0)
MCV: 92 fL (ref 80.0–100.0)
Monocytes Absolute: 0.6 10*3/uL (ref 0.1–1.0)
Monocytes Relative: 4 %
Neutro Abs: 12.9 10*3/uL — ABNORMAL HIGH (ref 1.7–7.7)
Neutrophils Relative %: 88 %
Platelets: 297 10*3/uL (ref 150–400)
RBC: 4.02 MIL/uL (ref 3.87–5.11)
RDW: 14.2 % (ref 11.5–15.5)
WBC: 14.8 10*3/uL — ABNORMAL HIGH (ref 4.0–10.5)
nRBC: 0 % (ref 0.0–0.2)

## 2020-10-02 LAB — COMPREHENSIVE METABOLIC PANEL
ALT: 28 U/L (ref 0–44)
AST: 27 U/L (ref 15–41)
Albumin: 2.8 g/dL — ABNORMAL LOW (ref 3.5–5.0)
Alkaline Phosphatase: 77 U/L (ref 38–126)
Anion gap: 9 (ref 5–15)
BUN: 12 mg/dL (ref 8–23)
CO2: 26 mmol/L (ref 22–32)
Calcium: 8.5 mg/dL — ABNORMAL LOW (ref 8.9–10.3)
Chloride: 99 mmol/L (ref 98–111)
Creatinine, Ser: 0.58 mg/dL (ref 0.44–1.00)
GFR, Estimated: >60 mL/min (ref >60)
Glucose, Bld: 142 mg/dL — ABNORMAL HIGH (ref 70–99)
Potassium: 3.9 mmol/L (ref 3.5–5.1)
Sodium: 134 mmol/L — ABNORMAL LOW (ref 135–145)
Total Bilirubin: 0.6 mg/dL (ref 0.3–1.2)
Total Protein: 6.4 g/dL — ABNORMAL LOW (ref 6.5–8.1)

## 2020-10-02 LAB — PROTIME-INR
INR: 1.4 — ABNORMAL HIGH (ref 0.8–1.2)
Prothrombin Time: 17.4 seconds — ABNORMAL HIGH (ref 11.4–15.2)

## 2020-10-02 LAB — D-DIMER, QUANTITATIVE: D-Dimer, Quant: 3.61 ug/mL-FEU — ABNORMAL HIGH (ref 0.00–0.50)

## 2020-10-02 LAB — PROCALCITONIN: Procalcitonin: 1.93 ng/mL

## 2020-10-02 LAB — LACTIC ACID, PLASMA
Lactic Acid, Venous: 2.4 mmol/L (ref 0.5–1.9)
Lactic Acid, Venous: 1.1 mmol/L (ref 0.5–1.9)

## 2020-10-02 LAB — APTT: aPTT: 38 seconds — ABNORMAL HIGH (ref 24–36)

## 2020-10-02 IMAGING — DX DG CHEST 1V PORT
1 series · 1 of 1 positions shown · non-contrast
Comparison: [DATE]

CLINICAL DATA: Shortness of breath

EXAM:
PORTABLE CHEST 1 VIEW

[chest ap]
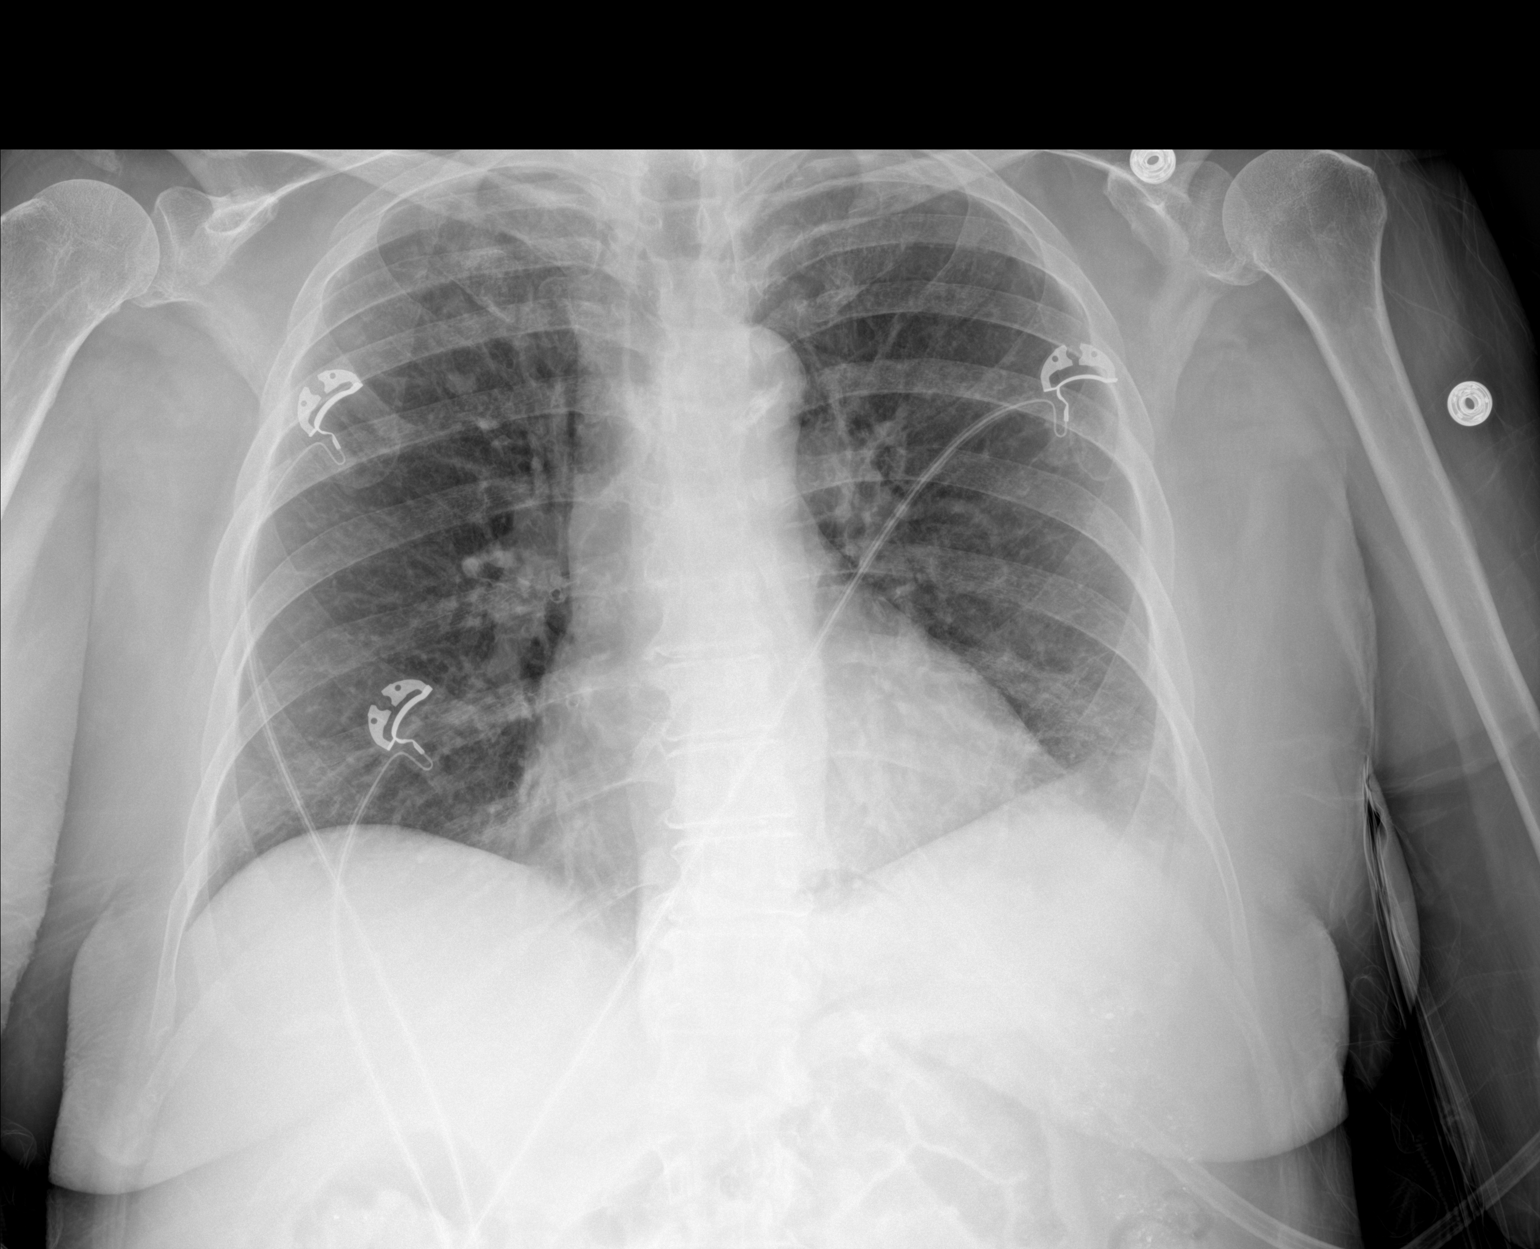

[1 of 1 positions shown; findings below may reference images not displayed]

FINDINGS: Heart and mediastinal contours are within normal limits. No focal
opacities or effusions. No acute bony abnormality.
IMPRESSION: No active disease.

## 2020-10-02 MED ORDER — AMIODARONE HCL IN DEXTROSE 360-4.14 MG/200ML-% IV SOLN
30.0000 mg/h | INTRAVENOUS | Status: DC
  Filled 2020-10-02: qty 200

## 2020-10-02 MED ORDER — SODIUM CHLORIDE 0.9 % IV SOLN
2.0000 g | Freq: Once | INTRAVENOUS | Status: DC
  Filled 2020-10-02: qty 2

## 2020-10-02 MED ORDER — METOPROLOL TARTRATE 25 MG PO TABS
12.5000 mg | ORAL_TABLET | Freq: Three times a day (TID) | ORAL | Status: DC
  Administered 2020-10-02: 12.5 mg via ORAL
  Filled 2020-10-02: qty 1

## 2020-10-02 MED ORDER — AMIODARONE LOAD VIA INFUSION
150.0000 mg | Freq: Once | INTRAVENOUS | Status: AC
  Administered 2020-10-02: 150 mg via INTRAVENOUS
  Filled 2020-10-02: qty 83.34

## 2020-10-02 MED ORDER — NOREPINEPHRINE 4 MG/250ML-% IV SOLN
2.0000 ug/min | INTRAVENOUS | Status: DC
  Administered 2020-10-02: 2 ug/min via INTRAVENOUS
  Administered 2020-10-03: 8 ug/min via INTRAVENOUS
  Administered 2020-10-03: 5 ug/min via INTRAVENOUS
  Administered 2020-10-04: 4 ug/min via INTRAVENOUS
  Administered 2020-10-05: 2 ug/min via INTRAVENOUS
  Administered 2020-10-06: 7 ug/min via INTRAVENOUS
  Administered 2020-10-06 – 2020-10-07 (×2): 5 ug/min via INTRAVENOUS
  Filled 2020-10-02 (×9): qty 250

## 2020-10-02 MED ORDER — AMIODARONE HCL IN DEXTROSE 360-4.14 MG/200ML-% IV SOLN
60.0000 mg/h | INTRAVENOUS | Status: AC
  Administered 2020-10-02: 60 mg/h via INTRAVENOUS
  Filled 2020-10-02: qty 200

## 2020-10-02 MED ORDER — SODIUM CHLORIDE 0.9 % IV SOLN
250.0000 mL | INTRAVENOUS | Status: DC
  Administered 2020-10-02 – 2020-10-04 (×2): 250 mL via INTRAVENOUS

## 2020-10-02 MED ORDER — LACTATED RINGERS IV BOLUS (SEPSIS)
500.0000 mL | Freq: Once | INTRAVENOUS | Status: AC
  Administered 2020-10-02: 500 mL via INTRAVENOUS

## 2020-10-02 MED ORDER — LACTATED RINGERS IV BOLUS (SEPSIS)
250.0000 mL | Freq: Once | INTRAVENOUS | Status: AC
  Administered 2020-10-02: 250 mL via INTRAVENOUS

## 2020-10-02 MED ORDER — METRONIDAZOLE 500 MG/100ML IV SOLN
500.0000 mg | Freq: Three times a day (TID) | INTRAVENOUS | Status: DC
  Administered 2020-10-02 – 2020-10-03 (×2): 500 mg via INTRAVENOUS
  Filled 2020-10-02 (×4): qty 100

## 2020-10-02 MED ORDER — SODIUM CHLORIDE 0.9 % IV SOLN
2.0000 g | Freq: Two times a day (BID) | INTRAVENOUS | Status: DC
  Administered 2020-10-02 – 2020-10-03 (×3): 2 g via INTRAVENOUS
  Filled 2020-10-02 (×6): qty 2

## 2020-10-02 MED ORDER — LACTATED RINGERS IV BOLUS (SEPSIS)
1000.0000 mL | Freq: Once | INTRAVENOUS | Status: AC
  Administered 2020-10-02: 1000 mL via INTRAVENOUS

## 2020-10-02 MED ORDER — POTASSIUM CHLORIDE 10 MEQ/100ML IV SOLN
10.0000 meq | INTRAVENOUS | Status: AC
  Administered 2020-10-02 (×4): 10 meq via INTRAVENOUS
  Filled 2020-10-02 (×4): qty 100

## 2020-10-02 NOTE — Progress Notes (Signed)
elink following sepsis, called bedside RN to reach out for assistance. Briefly explained what needed to be done. Elink RN can be reached at [phone] if any questions or assistance is needed

## 2020-10-02 NOTE — Consult Note (Signed)
  Amiodarone Drug - Drug Interaction Consult Note  Recommendations: No changed recommended at this time.  Amiodarone is metabolized by the cytochrome P450 system and therefore has the potential to cause many drug interactions. Amiodarone has an average plasma half-life of 50 days (range 20 to 100 days).   There is potential for drug interactions to occur several weeks or months after stopping treatment and the onset of drug interactions may be slow after initiating amiodarone.   [x]  Statins: Increased risk of myopathy. Simvastatin- restrict dose to 20mg  daily. Other statins: counsel patients to report any muscle pain or weakness immediately. ON crestor.   []  Anticoagulants: Amiodarone can increase anticoagulant effect. Consider warfarin dose reduction. Patients should be monitored closely and the dose of anticoagulant altered accordingly, remembering that amiodarone levels take several weeks to stabilize.  []  Antiepileptics: Amiodarone can increase plasma concentration of phenytoin, the dose should be reduced. Note that small changes in phenytoin dose can result in large changes in levels. Monitor patient and counsel on signs of toxicity.  [x]  Beta blockers: increased risk of bradycardia, AV block and myocardial depression. Sotalol - avoid concomitant use.  []   Calcium channel blockers (diltiazem and verapamil): increased risk of bradycardia, AV block and myocardial depression.  []   Cyclosporine: Amiodarone increases levels of cyclosporine. Reduced dose of cyclosporine is recommended.  []  Digoxin dose should be halved when amiodarone is started.  []  Diuretics: increased risk of cardiotoxicity if hypokalemia occurs.  []  Oral hypoglycemic agents (glyburide, glipizide, glimepiride): increased risk of hypoglycemia. Patient's glucose levels should be monitored closely when initiating amiodarone therapy.   []  Drugs that prolong the QT interval:  Torsades de pointes risk may be increased with  concurrent use - avoid if possible.  Monitor QTc, also keep magnesium/potassium WNL if concurrent therapy can't be avoided. Marland Kitchen Antibiotics: e.g. fluoroquinolones, erythromycin. . Antiarrhythmics: e.g. quinidine, procainamide, disopyramide, sotalol. . Antipsychotics: e.g. phenothiazines, haloperidol.  . Lithium, tricyclic antidepressants, and methadone. Thank You,  Oswald Hillock  10/02/2020 2:39 PM

## 2020-10-02 NOTE — Progress Notes (Signed)
Progress Note  Patient Name: Kathryn Garrett Date of Encounter: 10/02/2020  [facility] Cardiologist: Larae Grooms, MD   Subjective   Feeling well.  Notes some palpitations but denies any respiratory distress or chest pain.  Inpatient Medications    Scheduled Meds: . aspirin  81 mg Oral QHS  . budesonide (PULMICORT) nebulizer solution  0.25 mg Nebulization BID  . buprenorphine-naloxone  2 tablet Sublingual Daily  . Chlorhexidine Gluconate Cloth  6 each Topical QHS  . clopidogrel  75 mg Oral QHS  . enoxaparin (LOVENOX) injection  40 mg Subcutaneous Q24H  . fluticasone  1 spray Each Nare Daily  . insulin aspart  0-15 Units Subcutaneous Q4H  . levothyroxine  88 mcg Oral Q0600  . loratadine  10 mg Oral Daily  . mouth rinse  15 mL Mouth Rinse BID  . metoprolol succinate  50 mg Oral Daily  . multivitamin with minerals  1 tablet Oral Daily  . pantoprazole  40 mg Oral Daily  . rosuvastatin  40 mg Oral QHS  . sacubitril-valsartan  1 tablet Oral BID  . sodium chloride flush  3 mL Intravenous Q12H  . sodium chloride flush  3 mL Intravenous Q12H   Continuous Infusions: . sodium chloride Stopped (09/24/20 1321)  . amiodarone 60 mg/hr (10/02/20 1436)   Followed by  . amiodarone    . potassium chloride     PRN Meds: acetaminophen **OR** acetaminophen, albuterol, artificial tears, docusate, guaiFENesin, polyethylene glycol   Vital Signs    Vitals:   10/02/20 1151 10/02/20 1342 10/02/20 1440 10/02/20 1450  BP: (!) 168/82 (!) 116/59 126/65 119/60  Pulse: (!) 107 (!) 127    Resp: 18 20  18   Temp: 98.4 F (36.9 C) 99.1 F (37.3 C) 98.8 F (37.1 C)   TempSrc:  Oral    SpO2:  92%    Weight:      Height:        Intake/Output Summary (Last 24 hours) at 10/02/2020 1516 Last data filed at 10/02/2020 1350 Gross per 24 hour  Intake 480 ml  Output 350 ml  Net 130 ml   Last 3 Weights 10/02/2020 10/01/2020 09/30/2020  Weight (lbs) 120 lb 4.8 oz 121 lb 7.6 oz 124 lb 8 oz   Weight (kg) 54.568 kg 55.1 kg 56.473 kg      Telemetry    Sinus tachycardia.  Rates 110s to 150s.- Personally Reviewed  ECG    n/a - Personally Reviewed  Physical Exam   VS:  BP 119/60   Pulse (!) 127   Temp 98.8 F (37.1 C)   Resp 18   Ht 4\' 11"  (1.499 m)   Wt 54.6 kg   SpO2 92%   BMI 24.30 kg/m  , BMI Body mass index is 24.3 kg/m. GENERAL:  Chronically ill-appearing HEENT: Pupils equal round and reactive, fundi not visualized, oral mucosa unremarkable NECK:  No jugular venous distention, waveform within normal limits, carotid upstroke brisk and symmetric, no bruits LUNGS:  Clear to auscultation bilaterally HEART:  Tachycardic.  Regular rhythm.   PMI not displaced or sustained,S1 and S2 within normal limits, no S3, no S4, no clicks, no rubs, no murmurs ABD:  Flat, positive bowel sounds normal in frequency in pitch, no bruits, no rebound, no guarding, no midline pulsatile mass, no hepatomegaly, no splenomegaly EXT:  2 plus pulses throughout, no edema, no cyanosis no clubbing SKIN:  No rashes no nodules NEURO:  Cranial nerves II through XII grossly intact,  motor grossly intact throughout [facility]:  Cognitively intact, oriented to person place and time   Labs    High Sensitivity Troponin:   Recent Labs  Lab 09/10/20 2050 09/11/20 0437 09/11/20 1433 09/11/20 1611 09/11/20 2011  TROPONINIHS 1,838* 2,434* 2,265* 2,455* 2,317*      Chemistry Recent Labs  Lab 09/27/20 0457 09/30/20 0430 10/02/20 0508  NA 136 137 138  K 3.5 3.4* 3.3*  CL 105 106 104  CO2 22 24 27   GLUCOSE 117* 109* 110*  BUN 30* 20 10  CREATININE 0.48 0.56 0.49  CALCIUM 8.8* 8.5* 8.5*  GFRNONAA >60 >60 >60  ANIONGAP 9 7 7      Hematology Recent Labs  Lab 09/28/20 0434 09/29/20 0442 09/30/20 0430  WBC 10.8* 12.2* 8.4  RBC 4.00 3.97 3.68*  HGB 11.9* 12.1 10.9*  HCT 38.3 38.2 34.8*  MCV 95.8 96.2 94.6  MCH 29.8 30.5 29.6  MCHC 31.1 31.7 31.3  RDW 14.7 14.6 14.5  PLT 339 372 351     BNPNo results for input(s): BNP, PROBNP in the last 168 hours.   DDimer No results for input(s): DDIMER in the last 168 hours.   Radiology    No results found.  Cardiac Studies   LHC/RHC 09/24/20: Conclusions: 1. Mild to moderate, non-obstructive coronary artery disease including 20-35% distal LMCA and 50% ostial LAD disease, similar to prior catheterization when FFR was not hemodynamically significant. 2. Patent proximal RCA stents with mild to moderate in-stent restenosis (30-40%). 3. Normal left and right heart filling pressures. 4. Normal pulmonary artery pressures. 5. Normal cardiac output/index.  Recommendations: 1. Maintain net even fluid balance. 2. Optimize goal-directed medical therapy with plans to repeat limited echo in 4-6 weeks. 3. Continue secondary prevention of coronary artery disease.  Echo 09/11/2020: 1. Recommend limited echo with iv contrast agent (definity) for addequate  assessment of LVEF and wall motion.. Left ventricular ejection fraction,  by estimation, is 25 to 30%. The left ventricle has severely decreased  function. Left ventricular  endocardial border not optimally defined to evaluate regional wall motion.  Left ventricular diastolic parameters are indeterminate.  2. Right ventricular systolic function is normal. The right ventricular  size is normal.  3. The mitral valve was not well visualized. Mild mitral valve  regurgitation.  4. The aortic valve was not well visualized. Aortic valve regurgitation  is not visualized.    Patient Profile     75 y.o. female with CAD status post RCA PCI in 2017 and in-stent restenosis requiring PCI, VF arrest,, chronic systolic diastolic heart failure, COPD, ongoing tobacco abuse, hypertension, hyperlipidemia, COPD, and narcotic abuse admitted with COPD exacerbation and Klebsiella pneumonia as well as acute on chronic systolic and diastolic heart failure.  Cardiology was initially consulted for elevated  troponin.  Echo revealed new systolic dysfunction.  Hospital course was complicated by cardiogenic shock requiring milrinone for diuresis and hypoxic respiratory failure requiring intubation.  She underwent left and right heart cath on 5/13 and was found to have mild to moderate nonobstructive CAD and normal filling pressures.  She also had SVT that was managed with metoprolol.  Cardiology signed off and now is very engaging due to recurrent tachycardia.  Assessment & Plan    #SVT: #Sinus tachycardia: Ms. Rembert's hospital course has been complicated by episodes of SVT.  She has initially been managed with metoprolol.  Today she had sudden onset of tachycardia to the 150s.  She reports that she was washing up and her heart started  racing.  She denies any chest pain.  She does feel some palpitations.  Her breathing is at baseline.  On review of telemetry, the morphology of her P wave seems to be unchanged from her sinus rhythm.  I think this is all sinus tachycardia.  Is unclear why she is so tachycardic and why it has not come back down to baseline when she started resting.  She has been started on IV amiodarone, which is reasonable given her low blood pressures.  Continue metoprolol.  We will try to give an additional 12.5 mg now and again in 8 hours.  This should be okay as long as her systolic blood pressures greater than 90.  Potassium was low this morning so we will also supplement that to maintain potassium greater than 4.  Agree with getting a CT to rule out PE, though this seems less likely given that she has been on antiplatelets and prophylactic heparin.  #Demand ischemia: #CAD status post PCI: Troponin was elevated this admission but she was found to have nonobstructive disease on cath.  Continue aspirin, clopidogrel, metoprolol, and rosuvastatin.  #Acute systolic and diastolic heart failure: LVEF was reduced on this admission.  Volume status is now stable.  Continue metoprolol and  Entresto.  We are giving 2 additional doses of metoprolol for heart rate control as above.  She continues to have heart rates greater than 80, consider chronic ivabradine.  Recommend adding spironolactone and an SGLT2 inhibitor as blood pressure allows.  She will need a repeat echo in 3 months to assess for candidacy for ICD.      For questions or updates, please contact [place] Please consult [email or web address] for contact info under        Signed, Skeet Latch, MD  10/02/2020, 3:16 PM

## 2020-10-02 NOTE — Care Management (Signed)
Patient developed acute onset tachyarrhythmia this afternoon.  Responded to bedside.  Patient was found to be a little drowsy but mentating clearly.  Cardiology reinvolved.  Recommendations appreciated.  P wave morphology similar to previous episodes of SVT.  Suspect sinus tachycardia.  Amiodarone 150 mg bolus and gtt. initiated with improvement in heart rate.  At approximately 1800 received notification from bedside RN the patient was increasingly lethargic and had declining blood pressure.  Also became febrile.  Suspect evolving sepsis.  Chest x-ray done earlier this afternoon did not demonstrate any abnormalities.  Suspect urinary source.  Patient septic this was likely driving the tachyarrhythmia.  See below for plan.  Plan: Initiate sepsis bundle Lactated ringer bolus 30 mils per KG Blood cultures Urine cultures Repeat CBC and CMP Stat lactic acid Stat procalcitonin Cefepime pharmacy dosing assistance  Patient with multiple medical comorbidities and high risk for further decompensation.  Monitor closely on PCU for now.  Patient further decompensates and developed shock physiology consider transfer to intensive care unit and we engagement of PCCM for consideration for vasopressor support.  Ralene Muskrat MD

## 2020-10-02 NOTE — Progress Notes (Signed)
Cross Cover Patient improving per clinical exam as her mentation is normal.  She states she had period of severe chills this afternoon but feels much better. Denies pain or burning with urination.  Feels she is able to empty her bladder completely with voids  WBC 14.8, lactic 2.4 Blood pressure minimally improved with fluids. MAP 55 with last 250 ml infusing.  Dlscussed with Transfer to step down for closer monitoring, possible pressors.  Plan discussed with patient and family who agree. Case discussed with critical care NP Marda Stalker.

## 2020-10-02 NOTE — Progress Notes (Signed)
Date and time results received: 10/02/20 1945  Test: Lactic Acid Critical Value: 2.4  Name of Provider Notified: Randol Kern NP  Orders Received? Or Actions Taken?: Receiving bolus. Patient is going to ICU for higher level of care.Marland Kitchen

## 2020-10-02 NOTE — Progress Notes (Signed)
PROGRESS NOTE    Kathryn Garrett  BSW:[id number] DOB: [birth year] DOA: 09/10/2020 PCP: Imagene Riches, NP   Brief Narrative:  74/F presented to the [facility] ED from home with complaints of shortness of breath -Admitted on 4/29 with hypoxic respiratory failure secondary to COPD exacerbation and community-acquired pneumonia [facility] course was complicated by flash pulmonary edema and NSTEMI requiring mechanical ventilation, intubated on 4/30  Post extubation patient continued to slowly improve.  Initially was slated for CIR however unfortunately insurance denied.  Currently bed search initiated for skilled nursing facility.  Patient remains medically stable for discharge.  Received notification of case management that SNF had been denied by patient's insurance.  Appeal currently in progress.   Assessment & Plan:   Principal Problem:   Acute exacerbation of chronic obstructive pulmonary disease (COPD) (HCC) Active Problems:   CAP (community acquired pneumonia)   HTN (hypertension)   Tobacco abuse   Hyperlipidemia LDL goal <70   Severe sepsis (HCC)   NSTEMI (non-ST elevated myocardial infarction) (HCC)   Hyperglycemia   SOB (shortness of breath)   Acute respiratory failure (HCC)   HFrEF (heart failure with reduced ejection fraction) (HCC)   Acute on chronic HFrEF (heart failure with reduced ejection fraction) (HCC)  Acute hypoxic respiratory failure  Acute COPD Exacerbation/left lung multifocal pneumonia Flash pulmonary edema on 4/30 -Intubated for flash pulmonary edema on, extubated 5/7 -Clinically improving, completed antibiotic course -Treated with diuretics as well, now off -Improved and stable, weaned off O2 -Discharge planning, CIR declined -Now bed search initiated for skilled nursing facility -[facility] aware and is following for placement options -Patient has had a complicated hospital course and is significantly debilitated.  In my opinion she is unsafe to return home.  She  does have recovery potential and would benefit from standard Garrett-term rehab.  Per case management appeal process has been initiated.  Sepsis, left lower lobe pneumonia Dysphagia -tracheal aspirate on 4/30 with KLEBSIELLA PNEUMONIAE (resistant to Ampicillin) -Completed 7 days of antibiotics. -Improving -SLP following, continues to have dysphagia, currently on dysphagia 3, honey thick liquids -Will need close SLP FU at rehab to monitor for continued improvement and changes to diet -Currently on honey thick liquids.  Intermittently tolerating p.o. intake.  Acute systolic congestive heart failure/Flash pulmonary edema NSTEMI Paroxysmal SVT -Echo noted with drop in EF down to 30%  -Briefly required inotropes in the ICU, this was then weaned off -Cardiology consulted, left heart cath noted nonobstructive coronary disease, recommended medical management and repeat echo in 2 to 3 months -Continueaspirin, Plavix, metoprolol, Entresto -Clinically euvolemic at this time -Follow-up with Dr. Irish Lack in 2 to 3 weeks  Acute metabolic/toxic encephalopathy -ICU stay complicated by severe encephalopathy while on vent -At baseline on buprenorphine/naloxone which was held on admission, now resumed -Also had an MRI brain which was unremarkable -Improved and stable now, PT OT as tolerated -CIR recommended for rehab.  Declined.  SNF bed search initiated -SNF also declined.  Family filing appeal with [employer].  COPD Ongoing tobacco abuse -Counseled, tapered off prednisone, continue duo nebs   DVT prophylaxis: SQ Lovenox Code Status: Full Family Communication: Daughter Kathryn Garrett [phone] on 5/21 Disposition Plan: Status is: Inpatient  Remains inpatient appropriate because:Unsafe d/c plan   Dispo: The patient is from: Home              Anticipated d/c is to: SNF              Patient currently is medically stable to d/c.  Difficult to place patient No  Initial plan for CIR.   Insurance declined.  Case management informed that skilled nursing facility is also been declined.  Appeal process in progress.     Level ofcare: Progressive Cardiac  Consultants:   Palliative care  Procedures:   None  Antimicrobials:   None   Subjective: Patient seen and examined.  Appears fatigued.  Sitting up in bed.  Tolerating small amounts of p.o. intake.  Objective: Vitals:   10/02/20 0334 10/02/20 0718 10/02/20 0752 10/02/20 1151  BP: (!) 121/52  (!) 118/48 (!) 168/82  Pulse: 97  95 (!) 107  Resp: 19  18 18   Temp: 97.8 F (36.6 C)  98.3 F (36.8 C) 98.4 F (36.9 C)  TempSrc:      SpO2: 96% 93% 94%   Weight: 54.6 kg     Height:        Intake/Output Summary (Last 24 hours) at 10/02/2020 1304 Last data filed at 10/02/2020 0940 Gross per 24 hour  Intake 480 ml  Output 350 ml  Net 130 ml   Filed Weights   09/30/20 0346 10/01/20 0508 10/02/20 0334  Weight: 56.5 kg 55.1 kg 54.6 kg    Examination:  General exam: Appears calm and comfortable.  Appears fatigued Respiratory system: Clear to auscultation. Respiratory effort normal. Cardiovascular system: S1 & S2 heard, RRR. No JVD, murmurs, rubs, gallops or clicks. No pedal edema. Gastrointestinal system: Abdomen is nondistended, soft and nontender. No organomegaly or masses felt. Normal bowel sounds heard. Central nervous system: Alert and oriented. No focal neurological deficits. Extremities: Symmetric 5 x 5 power. Skin: No rashes, lesions or ulcers Psychiatry: Judgement and insight appear normal. Mood & affect appropriate.     Data Reviewed: I have personally reviewed following labs and imaging studies  CBC: Recent Labs  Lab 09/26/20 0404 09/27/20 0457 09/28/20 0434 09/29/20 0442 09/30/20 0430  WBC 17.8* 16.0* 10.8* 12.2* 8.4  NEUTROABS 12.0* 11.1* 5.8 7.3 4.6  HGB 13.9 13.3 11.9* 12.1 10.9*  HCT 44.3 40.8 38.3 38.2 34.8*  MCV 95.9 94.7 95.8 96.2 94.6  PLT 336 346 339 372 242   Basic  Metabolic Panel: Recent Labs  Lab 09/26/20 0328 09/27/20 0457 09/30/20 0430 10/02/20 0508  NA 135 136 137 138  K 3.8 3.5 3.4* 3.3*  CL 105 105 106 104  CO2 20* 22 24 27   GLUCOSE 103* 117* 109* 110*  BUN 32* 30* 20 10  CREATININE 0.52 0.48 0.56 0.49  CALCIUM 8.8* 8.8* 8.5* 8.5*   GFR: Estimated Creatinine Clearance: 45.8 mL/min (by C-G formula based on SCr of 0.49 mg/dL). Liver Function Tests: No results for input(s): AST, ALT, ALKPHOS, BILITOT, PROT, ALBUMIN in the last 168 hours. No results for input(s): LIPASE, AMYLASE in the last 168 hours. No results for input(s): AMMONIA in the last 168 hours. Coagulation Profile: No results for input(s): INR, PROTIME in the last 168 hours. Cardiac Enzymes: No results for input(s): CKTOTAL, CKMB, CKMBINDEX, TROPONINI in the last 168 hours. BNP (last 3 results) No results for input(s): PROBNP in the last 8760 hours. HbA1C: No results for input(s): HGBA1C in the last 72 hours. CBG: Recent Labs  Lab 10/01/20 2313 10/02/20 0054 10/02/20 0336 10/02/20 0754 10/02/20 1155  GLUCAP 107* 98 110* 116* 165*   Lipid Profile: No results for input(s): CHOL, HDL, LDLCALC, TRIG, CHOLHDL, LDLDIRECT in the last 72 hours. Thyroid Function Tests: No results for input(s): TSH, T4TOTAL, FREET4, T3FREE, THYROIDAB in the last 72 hours. Anemia  Panel: No results for input(s): VITAMINB12, FOLATE, FERRITIN, TIBC, IRON, RETICCTPCT in the last 72 hours. Sepsis Labs: No results for input(s): PROCALCITON, LATICACIDVEN in the last 168 hours.  No results found for this or any previous visit (from the past 240 hour(s)).       Radiology Studies: No results found.      Scheduled Meds: . aspirin  81 mg Oral QHS  . budesonide (PULMICORT) nebulizer solution  0.25 mg Nebulization BID  . buprenorphine-naloxone  2 tablet Sublingual Daily  . Chlorhexidine Gluconate Cloth  6 each Topical QHS  . clopidogrel  75 mg Oral QHS  . enoxaparin (LOVENOX) injection   40 mg Subcutaneous Q24H  . fluticasone  1 spray Each Nare Daily  . insulin aspart  0-15 Units Subcutaneous Q4H  . levothyroxine  88 mcg Oral Q0600  . loratadine  10 mg Oral Daily  . mouth rinse  15 mL Mouth Rinse BID  . metoprolol succinate  50 mg Oral Daily  . multivitamin with minerals  1 tablet Oral Daily  . pantoprazole  40 mg Oral Daily  . rosuvastatin  40 mg Oral QHS  . sacubitril-valsartan  1 tablet Oral BID  . sodium chloride flush  3 mL Intravenous Q12H  . sodium chloride flush  3 mL Intravenous Q12H   Continuous Infusions: . sodium chloride Stopped (09/24/20 1321)     LOS: 22 days    Time spent: 15 minutes    Sidney Ace, MD [facility] Pager [phone]  If 7PM-7AM, please contact night-coverage 10/02/2020, 1:04 PM

## 2020-10-02 NOTE — Progress Notes (Signed)
Report given to Beth RN in ICU.

## 2020-10-02 NOTE — Progress Notes (Signed)
Pharmacy Antibiotic Note  Kathryn Garrett is a 75 y.o. female admitted on 09/10/2020 with sepsis and UTI.  Pharmacy has been consulted for cefepime dosing.  Patient is febrile with Tmax 101.2, last WBC WNL  Plan: Cefepime 2g IV q12h Monitor clinical picture, renal function F/U C&S, abx de-escalation, LOT   Height: 4\' 11"  (149.9 cm) Weight: 54.6 kg (120 lb 4.8 oz) IBW/kg (Calculated) : 43.2  Temp (24hrs), Avg:98.7 F (37.1 C), Min:97.8 F (36.6 C), Max:101.2 F (38.4 C)  Recent Labs  Lab 09/26/20 0328 09/26/20 0404 09/27/20 0457 09/28/20 0434 09/29/20 0442 09/30/20 0430 10/02/20 0508  WBC  --  17.8* 16.0* 10.8* 12.2* 8.4  --   CREATININE 0.52  --  0.48  --   --  0.56 0.49    Estimated Creatinine Clearance: 45.8 mL/min (by C-G formula based on SCr of 0.49 mg/dL).    Allergies  Allergen Reactions  . Azithromycin Rash    Antimicrobials this admission: Cefazolin 5/3>5/5 Doxy 4/29 >5/3 vanc 4/29 >5/1 Cefepime 4/29>5/3; 5/21>  Microbiology results: 4/30 Trach aspirate: Kleb Pneumo 4/29 BCx ng final 4/29 UCx 20k CFU Ecoli 4/29 MRSA PCR positive   Brendolyn Patty, PharmD Clinical Pharmacist  10/02/2020   6:27 PM

## 2020-10-02 NOTE — Progress Notes (Addendum)
Occupational Therapy Treatment Patient Details Name: Kathryn Garrett MRN: [id number] DOB: 1945/08/21 Today's Date: 10/02/2020    History of present illness 75 y.o. female with history of CAD with VF arrest s/p PCI to the RCA in 2017 s/p PTCA to the RCA for ISR in 2018, chronic combined systolic and diastolic CHF, COPD with ongoing tobacco use, HTN, HLD, anemia, and narcotic abuse on Suboxone therapy admitted with acute hypoxic respiratory failure requiring mechanical ventilation (extubated 09/17/2020) secondary to COPD exacerbation Klebsiella PNA and acute on chronic combined CHF with transient ST elevation and echo demonstrating new LV dysfunction.   OT comments  Pt seen for OT tx this date to f/u re: safety with ADLs/ADL mobility. Pt with decreased fxl activity toelrance this date comparatively to her performance with self care ADLs during yesterday's tx as evidenced by softer BP with SBP in the 90s and HR increase to 154 bpm with attempts to engage pt in one standing task (RN notified of both findings). OT engages pt in seated UB bathing tasks and g/h tasks, and standing LB bathing/drying. Pt requires MIN A to wash hair d/t decreased tolerance and requires SETUP only to wash face in sitting. Pt requires MIN/MOD A this date for LB d/t decreased standing tolerance as well as decreased tolerance for tasks that require more dynamic reaching skills. OT completes remainder of task to ensure safety and conserve pt energy. Overall worse tolerance for self care ADLs this date demonstrating somewhat waxing/waning pattern with ADL skills in relation to waxing/waning VS control. Will continue to follow acutely. Continue to anticipate pt will require f/u OT services in CIR setting as she is requiring assistance with the most basic of ADLs in comparison to previously being completely INDEP including working and driving. At this time, OT does not consider pt safe to live at home alone, and do not anticipate that she  even has the endurance to return home with family support and reasonably be able to contribute to her daily activities.    Follow Up Recommendations  CIR    Equipment Recommendations  3 in 1 bedside commode;Tub/shower seat    Recommendations for Other Services      Precautions / Restrictions Precautions Precautions: Fall Restrictions Weight Bearing Restrictions: No       Mobility Bed Mobility Overal bed mobility: Needs Assistance Bed Mobility: Supine to Sit     Supine to sit: HOB elevated;Supervision          Transfers Overall transfer level: Needs assistance Equipment used: Rolling walker (2 wheeled) Transfers: Sit to/from [facility] Sit to Stand: Min assist Stand pivot transfers: Min assist       General transfer comment: increasd time, noted decreasd tolerance today. Pt with HR again getting up to 150s with transfers despite improvement noted during yesterday's session (only hit 120 bpm)    Balance Overall balance assessment: Needs assistance Sitting-balance support: No upper extremity supported;Feet unsupported Sitting balance-Leahy Scale: Fair Sitting balance - Comments: G static sitting, F dynamic   Standing balance support: During functional activity;Bilateral upper extremity supported Standing balance-Leahy Scale: Fair Standing balance comment: requires UE support on RW                           ADL either performed or assessed with clinical judgement   ADL Overall ADL's : Needs assistance/impaired     Grooming: Wash/dry face;Oral care;Set up;Sitting   Upper Body Bathing: Minimal assistance;Sitting   Lower Body  Bathing: Minimal assistance;Moderate assistance;Sit to/from stand Lower Body Bathing Details (indicate cue type and reason): increased assist today as pt with decreased activity tolernace for dynamic reaching to perform posterior LB bathing. Upper Body Dressing : Supervision/safety;Sitting Upper Body Dressing  Details (indicate cue type and reason): to don/doff hospital gown                         Vision Patient Visual Report: No change from baseline     Perception     Praxis      Cognition Arousal/Alertness: Awake/alert Behavior During Therapy: WFL for tasks assessed/performed Overall Cognitive Status: Within Functional Limits for tasks assessed                                          Exercises Other Exercises Other Exercises: OT engages pt in seated UB bathing tasks and g/h tasks, and standing LB bathing/drying. Pt requires MIN/MOD A this date for LB d/t decreased standing tolerance as well as decreased tolerance for tasks that require more dynamic reaching skills. Pt noted to have HR increase to 154bpm in standing with attempts to contribute to LB ADLs. OT completes remainder of task to ensure safety and conserve pt energy.   Shoulder Instructions       General Comments      Pertinent Vitals/ Pain       Pain Assessment: 0-10 Pain Score: 2  Pain Location: raw bottom Pain Descriptors / Indicators: Tender Pain Intervention(s): Limited activity within patient's tolerance;Monitored during session;Repositioned;Other (comment) (applied barrier cream)  Home Living                                          Prior Functioning/Environment              Frequency  Min 3X/week        Progress Toward Goals  OT Goals(current goals can now be found in the care plan section)  Progress towards OT goals: Progressing toward goals  Acute Rehab OT Goals Patient Stated Goal: to return to work OT Goal Formulation: With patient/family Time For Goal Achievement: 10/14/20 Potential to Achieve Goals: Good  Plan Discharge plan remains appropriate;Frequency remains appropriate    Co-evaluation                 AM-PAC OT "6 Clicks" Daily Activity     Outcome Measure   Help from another person eating meals?: A Little Help from  another person taking care of personal grooming?: A Little Help from another person toileting, which includes using toliet, bedpan, or urinal?: A Little Help from another person bathing (including washing, rinsing, drying)?: A Little Help from another person to put on and taking off regular upper body clothing?: A Little Help from another person to put on and taking off regular lower body clothing?: A Lot 6 Click Score: 17    End of Session Equipment Utilized During Treatment: Rolling walker  OT Visit Diagnosis: Unsteadiness on feet (R26.81);Muscle weakness (generalized) (M62.81);Other symptoms and signs involving cognitive function   Activity Tolerance Patient tolerated treatment well   Patient Left with call bell/phone within reach;with chair alarm set   Nurse Communication Mobility status        Time: 5284-1324 OT Time Calculation (min): 31  min  Charges: OT General Charges $OT Visit: 1 Visit OT Treatments $Self Care/Home Management : 23-37 mins  Gerrianne Scale, MS, OTR/L ascom [phone] 10/02/20, 1:46 PM

## 2020-10-02 NOTE — Progress Notes (Signed)
PT Cancellation Note  Patient Details Name: [name] MRN: [id number] DOB: 11-07-1945   Cancelled Treatment:      PT attempt. Upon arriving pt was sitting in recliner with BLEs elevated. She present with lethargy and has difficulty keeping eyes open. Currently with elevated RR/HR sitting in chair. HR 130s and RR reaching 40 several times without activity. RN staff notified. Will hold PT.   Willette Pa 10/02/2020, 1:45 PM

## 2020-10-02 NOTE — Progress Notes (Signed)
NAME:  Kathryn Garrett, MRN:  [id number], DOB:  [birth year], LOS: 43 ADMISSION DATE:  09/10/2020, CONSULTATION DATE: 09/11/2020 REFERRING MD: Dr. Damita Dunnings, CHIEF COMPLAINT: Shortness of breath  History of Present Illness:  75 year old female presented to the [facility] ED from home with complaints of shortness of breath.  Patient reported 2 to 3 days of progressive shortness of breath and associated new onset nonproductive cough as well as a subjective fever.  Per ED documentation she denied associated chest pain/palpitations/diaphoresis.  And denies any recent nausea/vomiting, chills/rigors/myalgias.  Patient confirmed a history of COPD and current smoking history stating she is not on any supplemental oxygen at baseline.  ED course: Patient received cefepime due to concerns for a left lower lobe infiltrate, 500 mL NS bolus & IV fluids at 150 mL an hour Initial vitals: Afebrile at 98, tachypneic at 22, tachycardic at 108, BP 127/72 & 99% on BiPAP Significant labs: Serum bicarb 15, hyperglycemic at 350, troponin 70 > 745 > 1838, BNP 222.6, VBG revealed metabolic acidosis: 3.71/06/269/48.5, leukocytosis at 40.2, lactic acidosis 2.5> 2.2 > 2.5  TRH hospitalist were consulted for admission.  Around midnight PCCM was called due to patient's deteriorating respiratory status.  Per ED provider Dr. Karma Greaser and care RN the patient complained that " I can't breathe", was given a dose of Ativan and became somnolent on BiPAP.  Coarse crackles auscultated bilaterally, and the decision was made to emergently intubate the patient and place her on mechanical ventilation. PCCM consulted for management  Significant Hospital Events: Including procedures, antibiotic start and stop dates in addition to other pertinent events   . 09/10/2020-patient admits admitted with hospitalist service for COPD exacerbation and left lower lobe pneumonia . 09/11/2020-overnight patient had suspected flash pulmonary edema, dyspnea followed by  somnolence requiring emergent intubation and mechanical ventilation.  Admitted to ICU . 09/12/20- patient failed SBT , she had blood tinged secretions fromETT and aggitation, family was at bedside and we agreed on giving her more time and try again.  . 09/14/20- Failed SBT (became hypoxic with sats mid 80's, increased WOB and assessory muscle use, HR 140-160's). Will diurese today and add Metoprolol.  Tracheal aspirate from 4/30 with KLEBSIELLA PNEUMONIAE (resistant to Ampicillin)   . 09/15/20- Failed SBT (increased WOB and assessory muscle use, RR 40's, HR 140-150's, Hypertensive); plan to add scheduled PO Klonopin and Oxycodone, Lasix 40 mg IV x1, consult Palliative Care . 09/16/20- Worsening Leukocytosis up to 21.7, low grade fever overnight, increased secretions overnight, will repeat Tracheal aspirate, remove central line, add mucinex, plan for SBT . 09/17/20-[name] passed SBT in AM and following commands still having some moderate secretions . 09/18/20-  Extubated yesterday.  Failed swallow eval today.  . 09/20/20- Weaned to room air, Leukocytosis improving; Hemodynamically stable . 09/27/20- patient is coughing up phlegm but clinically is improved, we discussed pneumonia and COPD with outpatient clinic follow up with pulmonology.  There is plan for post dc rehab . 09/28/20- patient is improved and she is being optimized for CIR, pulmonary will sign off and can follow up on outpatient basis . 09/29/20- family at bedside today.  We discussed barium swallow with aspiration.  . 09/30/20- patient had PT today she walked today without need of additional supplemental O2 therapy.  She generally has been walking ok but has had severe LE weakness here.  She ate lunch infront of me had cough after couple bites.  We disucssed aspiartion risk and her barium swallow.  Marland Kitchen 10/01/20: PCCM signed off  .  10/02/20: Pt with hypotension despite aggressive fluid resuscitation concerning for sepsis secondary to UTI requiring levophed gtt  and transfer to stepdown unit.  PCCM signed back on case to assist with management   Objective   Blood pressure (!) 75/41, pulse 88, temperature 98.3 F (36.8 C), temperature source Oral, resp. rate 19, height 4\' 11"  (1.499 m), weight 54.6 kg, SpO2 98 %.        Intake/Output Summary (Last 24 hours) at 10/02/2020 2009 Last data filed at 10/02/2020 1810 Gross per 24 hour  Intake 480 ml  Output 250 ml  Net 230 ml   Filed Weights   09/30/20 0346 10/01/20 0508 10/02/20 0334  Weight: 56.5 kg 55.1 kg 54.6 kg    Examination: General: Acute on chronically ill-appearing female,resting in bed NA  HEENT: Atraumatic, normocephalic, neck supple, no JVD Neuro: Alert and oriented, follows commands, PERRLA  CV: Sinus rhythm, no R/G, 2+ radial/1+ distal pulses, no edema  Pulm: Clear throughout, even, non labored  GI: Soft, nontender, nondistended, no guarding or rebound tenderness, bowel sounds positive x4 Skin: Limited exam-warm and dry.  No obvious rashes, lesions, ulcerations Extremities: Warm and dry. Normal bulk and tone  Labs/imaging that I have personally reviewed  (right click and "Reselect all SmartList Selections" daily)  Labs 09/20/2020: WBC 20.1, K 3.0, glucose 124, BUN 68, Cr. 0.66 Chest x-ray 09/16/2020>>Endotracheal tube, NG tube, right IJ line in stable position. Heart size normal. Persistent but improved bilateral interstitial infiltrates. Tiny left pleural effusion cannot be excluded. No pneumothorax. MRI brain 09/13/2020:No acute brain finding. Mild chronic small-vessel ischemic change of the white matter, fairly typical for age. Labs 05/21: Na+ 134, glucose 142, albumin 2.8, pct 1.93, wbc 14.8, lactic acid 2.4, d-dimer 3.61 CXR 05/21: No active disease   Assessment & Plan:   Acute hypoxic respiratory failure secondary to Acute COPD Exacerbation & left lung multifocal pneumonia -S/p Extubation -Supplemental O2 as needed to maintain O2 sats 88 to 94% -Follow intermittent  CXR -Bronchodilators -Continue Budesonide nebs -Aggressive pulmonary toilet -PT/OT  Sepsis due to left lung multifocal pneumonia>> tracheal aspirate on 4/30 with KLEBSIELLA PNEUMONIAE (resistant to Ampicillin)>>treated -Monitor fever curve -Trend wbc  -Repeat Blood cultures and UA  -Completed 7 days of Cefepime/Ancef; However, on 05/21 cefepime resumed due to concerns of septic shock secondary to possible UTI   Acute systolic congestive heart failure Acute non-ST elevation MI s/p cardiac cath 05/13 recommendation medical management  New diagnosis of A. fib with RVR -Continuous cardiac monitoring -Patient's ejection fraction on echocardiogram is 30% -Diuresis as renal function and BP permits >> currently on 40 mg IV Lasix daily and will continue -Cardiology following, input is appreciated -Hold entresto and immediate release metoprolol due to hypotension  -Continue aspirin, plavix, and statin  Acute metabolic/toxic encephalopathy>>resolved  -Continue outpatient Suboxone due to narcotic abuse hx   Best practice (right click and "Reselect all SmartList Selections" daily)  Diet: DYS 3 Pain/Anxiety/Delirium protocol: not indicated  VAP protocol: not indicated  DVT prophylaxis: SCD and subq lovenox  GI prophylaxis: H2B Glucose control:  SSI Yes Central venous access: n/a Arterial line:  N/A Foley: N/A Mobility: Out of bed with assistance as tolerated PT consulted: needs Pt/OT Code Status:  full code Disposition: Stepdown   Labs   CBC: Recent Labs  Lab 09/27/20 0457 09/28/20 0434 09/29/20 0442 09/30/20 0430 10/02/20 1823  WBC 16.0* 10.8* 12.2* 8.4 14.8*  NEUTROABS 11.1* 5.8 7.3 4.6 12.9*  HGB 13.3 11.9* 12.1 10.9* 12.2  HCT 40.8  38.3 38.2 34.8* 37.0  MCV 94.7 95.8 96.2 94.6 92.0  PLT 346 339 372 351 998    Basic Metabolic Panel: Recent Labs  Lab 09/26/20 0328 09/27/20 0457 09/30/20 0430 10/02/20 0508 10/02/20 1736  NA 135 136 137 138 134*  K 3.8 3.5 3.4*  3.3* 3.9  CL 105 105 106 104 99  CO2 20* 22 24 27 26   GLUCOSE 103* 117* 109* 110* 142*  BUN 32* 30* 20 10 12   CREATININE 0.52 0.48 0.56 0.49 0.58  CALCIUM 8.8* 8.8* 8.5* 8.5* 8.5*   GFR: Estimated Creatinine Clearance: 45.8 mL/min (by C-G formula based on SCr of 0.58 mg/dL). Recent Labs  Lab 09/28/20 0434 09/29/20 0442 09/30/20 0430 10/02/20 1736 10/02/20 1823 10/02/20 1824  PROCALCITON  --   --   --  1.93  --   --   WBC 10.8* 12.2* 8.4  --  14.8*  --   LATICACIDVEN  --   --   --   --   --  2.4*    Liver Function Tests: Recent Labs  Lab 10/02/20 1736  AST 27  ALT 28  ALKPHOS 77  BILITOT 0.6  PROT 6.4*  ALBUMIN 2.8*   No results for input(s): LIPASE, AMYLASE in the last 168 hours. No results for input(s): AMMONIA in the last 168 hours.  ABG    Component Value Date/Time   PHART 7.38 09/15/2020 0500   PCO2ART 41 09/15/2020 0500   PO2ART 123 (H) 09/15/2020 0500   HCO3 33.5 (H) 09/17/2020 0944   TCO2 23 01/14/2009 0327   ACIDBASEDEF 0.8 09/15/2020 0500   O2SAT 88.8 09/17/2020 0944     Coagulation Profile: Recent Labs  Lab 10/02/20 1736  INR 1.4*    Cardiac Enzymes: No results for input(s): CKTOTAL, CKMB, CKMBINDEX, TROPONINI in the last 168 hours.  HbA1C: Hgb A1c MFr Bld  Date/Time Value Ref Range Status  09/11/2020 04:37 AM 6.4 (H) 4.8 - 5.6 % Final    Comment:    (NOTE) Pre diabetes:          5.7%-6.4%  Diabetes:              >6.4%  Glycemic control for   <7.0% adults with diabetes   10/02/2019 12:28 PM 6.4 (H) 4.8 - 5.6 % Final    Comment:             Prediabetes: 5.7 - 6.4          Diabetes: >6.4          Glycemic control for adults with diabetes: <7.0     CBG: Recent Labs  Lab 10/02/20 0336 10/02/20 0754 10/02/20 1155 10/02/20 1343 10/02/20 [place], [place] Pager [phone] (please enter 7 digits) PCCM Consult Pager [phone] (please enter 7 digits)

## 2020-10-02 NOTE — Progress Notes (Signed)
   10/02/20 1342  Assess: MEWS Score  Temp 99.1 F (37.3 C)  BP (!) 116/59  Pulse Rate (!) 127  Resp 20  SpO2 92 %  O2 Device Room Air  Assess: MEWS Score  MEWS Temp 0  MEWS Systolic 0  MEWS Pulse 2  MEWS RR 0  MEWS LOC 0  MEWS Score 2  MEWS Score Color Yellow  Assess: if the MEWS score is Yellow or Red  Were vital signs taken at a resting state? Yes  Focused Assessment Change from prior assessment (see assessment flowsheet)  Early Detection of Sepsis Score *See Row Information* Low  MEWS guidelines implemented *See Row Information* Yes  Treat  MEWS Interventions Other (Comment) (will continue to monitor)  Pain Scale 0-10  Pain Score 0  Patients Stated Pain Goal 0  Take Vital Signs  Increase Vital Sign Frequency  Yellow: Q 2hr X 2 then Q 4hr X 2, if remains yellow, continue Q 4hrs  Escalate  MEWS: Escalate Yellow: discuss with charge nurse/RN and consider discussing with provider and RRT  Notify: Charge Nurse/RN  Name of Charge Nurse/RN Notified lindsey Symphany Fleissner  Date Charge Nurse/RN Notified 10/02/20  Time Charge Nurse/RN Notified 1349  Notify: Provider  Provider Name/Title sreenath  Date Provider Notified 10/02/20  Time Provider Notified 1349  Notification Type Page  Notification Reason Change in status  Provider response En route;See new orders  Date of Provider Response 10/02/20  Time of Provider Response 1355  Document  Patient Outcome Stabilized after interventions  Progress note created (see row info) Yes  Assess: SIRS CRITERIA  SIRS Temperature  0  SIRS Pulse 1  SIRS Respirations  0  SIRS WBC 0  SIRS Score Sum  1

## 2020-10-03 DIAGNOSIS — A414 Sepsis due to anaerobes: Secondary | ICD-10-CM

## 2020-10-03 DIAGNOSIS — R6521 Severe sepsis with septic shock: Secondary | ICD-10-CM

## 2020-10-03 DIAGNOSIS — J441 Chronic obstructive pulmonary disease with (acute) exacerbation: Secondary | ICD-10-CM | POA: Diagnosis not present

## 2020-10-03 LAB — GLUCOSE, CAPILLARY
Glucose-Capillary: 115 mg/dL — ABNORMAL HIGH (ref 70–99)
Glucose-Capillary: 122 mg/dL — ABNORMAL HIGH (ref 70–99)
Glucose-Capillary: 164 mg/dL — ABNORMAL HIGH (ref 70–99)
Glucose-Capillary: 122 mg/dL — ABNORMAL HIGH (ref 70–99)
Glucose-Capillary: 142 mg/dL — ABNORMAL HIGH (ref 70–99)

## 2020-10-03 LAB — URINALYSIS, COMPLETE (UACMP) WITH MICROSCOPIC
Bilirubin Urine: NEGATIVE
Glucose, UA: NEGATIVE mg/dL
Hgb urine dipstick: NEGATIVE
Ketones, ur: NEGATIVE mg/dL
Leukocytes,Ua: NEGATIVE
Nitrite: NEGATIVE
Protein, ur: NEGATIVE mg/dL
Specific Gravity, Urine: 1.006 (ref 1.005–1.030)
Squamous Epithelial / HPF: NONE SEEN (ref 0–5)
pH: 6 (ref 5.0–8.0)

## 2020-10-03 LAB — CBC WITH DIFFERENTIAL/PLATELET
Abs Immature Granulocytes: 0.03 10*3/uL (ref 0.00–0.07)
Basophils Absolute: 0.1 10*3/uL (ref 0.0–0.1)
Basophils Relative: 1 %
Eosinophils Absolute: 0.1 10*3/uL (ref 0.0–0.5)
Eosinophils Relative: 1 %
HCT: 35.8 % — ABNORMAL LOW (ref 36.0–46.0)
Hemoglobin: 11.6 g/dL — ABNORMAL LOW (ref 12.0–15.0)
Immature Granulocytes: 0 %
Lymphocytes Relative: 16 %
Lymphs Abs: 1.4 10*3/uL (ref 0.7–4.0)
MCH: 30.3 pg (ref 26.0–34.0)
MCHC: 32.4 g/dL (ref 30.0–36.0)
MCV: 93.5 fL (ref 80.0–100.0)
Monocytes Absolute: 0.7 10*3/uL (ref 0.1–1.0)
Monocytes Relative: 8 %
Neutro Abs: 6.4 10*3/uL (ref 1.7–7.7)
Neutrophils Relative %: 74 %
Platelets: 305 10*3/uL (ref 150–400)
RBC: 3.83 MIL/uL — ABNORMAL LOW (ref 3.87–5.11)
RDW: 14.4 % (ref 11.5–15.5)
WBC: 8.6 10*3/uL (ref 4.0–10.5)
nRBC: 0 % (ref 0.0–0.2)

## 2020-10-03 LAB — BLOOD CULTURE ID PANEL (REFLEXED) - BCID2

## 2020-10-03 LAB — RENAL FUNCTION PANEL
Albumin: 2.5 g/dL — ABNORMAL LOW (ref 3.5–5.0)
Anion gap: 8 (ref 5–15)
BUN: 10 mg/dL (ref 8–23)
CO2: 25 mmol/L (ref 22–32)
Calcium: 8.4 mg/dL — ABNORMAL LOW (ref 8.9–10.3)
Chloride: 105 mmol/L (ref 98–111)
Creatinine, Ser: 0.44 mg/dL (ref 0.44–1.00)
GFR, Estimated: >60 mL/min (ref >60)
Glucose, Bld: 136 mg/dL — ABNORMAL HIGH (ref 70–99)
Phosphorus: 3.3 mg/dL (ref 2.5–4.6)
Potassium: 3.5 mmol/L (ref 3.5–5.1)
Sodium: 138 mmol/L (ref 135–145)

## 2020-10-03 LAB — MAGNESIUM: Magnesium: 1.7 mg/dL (ref 1.7–2.4)

## 2020-10-03 MED ORDER — MIDODRINE HCL 5 MG PO TABS
5.0000 mg | ORAL_TABLET | Freq: Three times a day (TID) | ORAL | Status: DC
  Administered 2020-10-03 – 2020-10-04 (×3): 5 mg via ORAL
  Filled 2020-10-03 (×3): qty 1

## 2020-10-03 NOTE — Plan of Care (Signed)
Patient remains on Levophed infusion; down-titrating as tolerated. IV Watch placed as patient is on pressors via PIV. Plan to obtain urine culture via I+O cath later this AM. Progressive mobility as tolerated.    Problem: Health Behavior/Discharge Planning: Goal: Ability to manage health-related needs will improve Outcome: Progressing   Problem: Activity: Goal: Risk for activity intolerance will decrease Outcome: Progressing   Problem: Elimination: Goal: Will not experience complications related to bowel motility Outcome: Progressing

## 2020-10-03 NOTE — TOC Progression Note (Signed)
Transition of Care [facility]) - Progression Note    Patient Details  Name: MAMIE HUNDERTMARK MRN: [id number] Date of Birth: 1945-06-18  Transition of Care [facility]) CM/SW [place], [place] Phone Number: [phone] 10/03/2020, 9:50 AM  Clinical Narrative:     Patient hypotensive despite IVF and started on Levo, concern to septic shock. Patient returned to ICU on 10/02/2020 in the evening.  PT/OT have recommended CIR but [employer] has denied CIR.  Pt's daughter [name] (Daughter) [phone] appealed denial, Clinicals faxed to [place] [phone] on 10/01/2020. TOC following for discharge plans.     Barriers to Discharge: Continued Medical Work up  Expected Discharge Plan and Services   In-house Referral: Clinical Social Work   Post Acute Care Choice:  [facility]) Living arrangements for the past 2 months: Single Family Home                                       Social Determinants of Health (SDOH) Interventions    Readmission Risk Interventions No flowsheet data found.

## 2020-10-03 NOTE — Progress Notes (Signed)
Progress Note  Patient Name: Kathryn Garrett Date of Encounter: 10/03/2020  [facility] Cardiologist: Larae Grooms, MD   Subjective   Overnight patient hypotensive despite IVF so she was transferred to ICU and started on Levo. LA 2.5, WBC 14.8. concern for septic shock. CXR negative. Remains in mostly SR, heart rates 80-100. Potassium today 3.5.    Inpatient Medications    Scheduled Meds: . aspirin  81 mg Oral QHS  . budesonide (PULMICORT) nebulizer solution  0.25 mg Nebulization BID  . buprenorphine-naloxone  2 tablet Sublingual Daily  . Chlorhexidine Gluconate Cloth  6 each Topical QHS  . clopidogrel  75 mg Oral QHS  . fluticasone  1 spray Each Nare Daily  . insulin aspart  0-15 Units Subcutaneous Q4H  . levothyroxine  88 mcg Oral Q0600  . loratadine  10 mg Oral Daily  . mouth rinse  15 mL Mouth Rinse BID  . metoprolol succinate  50 mg Oral Daily  . multivitamin with minerals  1 tablet Oral Daily  . pantoprazole  40 mg Oral Daily  . rosuvastatin  40 mg Oral QHS  . sodium chloride flush  3 mL Intravenous Q12H  . sodium chloride flush  3 mL Intravenous Q12H   Continuous Infusions: . sodium chloride Stopped (09/24/20 1321)  . sodium chloride 250 mL (10/02/20 2210)  . ceFEPime (MAXIPIME) IV Stopped (10/02/20 2208)  . norepinephrine (LEVOPHED) Adult infusion 8 mcg/min (10/03/20 0800)   PRN Meds: acetaminophen **OR** acetaminophen, albuterol, artificial tears, docusate, guaiFENesin, polyethylene glycol   Vital Signs    Vitals:   10/03/20 0500 10/03/20 0600 10/03/20 0700 10/03/20 0800  BP: (!) 105/54 (!) 117/55 (!) 113/59 130/64  Pulse: 84 90 89 86  Resp: 17 16 19 19   Temp:    98.8 F (37.1 C)  TempSrc:    Axillary  SpO2: 99% 99% 97% 98%  Weight: 54.6 kg     Height:        Intake/Output Summary (Last 24 hours) at 10/03/2020 0906 Last data filed at 10/03/2020 0900 Gross per 24 hour  Intake 1670.51 ml  Output 1850 ml  Net -179.49 ml   Last 3 Weights  10/03/2020 10/02/2020 10/01/2020  Weight (lbs) 120 lb 5.9 oz 120 lb 4.8 oz 121 lb 7.6 oz  Weight (kg) 54.6 kg 54.568 kg 55.1 kg      Telemetry    SR/ST, HR 80-105bpm - Personally Reviewed  ECG    No new - Personally Reviewed  Physical Exam   GEN: No acute distress.   Neck: No JVD Cardiac: RRR, no murmurs, rubs, or gallops.  Respiratory: Clear to auscultation bilaterally. GI: Soft, nontender, non-distended  MS: No edema; No deformity. Neuro:  Nonfocal  Psych: Normal affect   Labs    High Sensitivity Troponin:   Recent Labs  Lab 09/10/20 2050 09/11/20 0437 09/11/20 1433 09/11/20 1611 09/11/20 2011  TROPONINIHS 1,838* 2,434* 2,265* 2,455* 2,317*      Chemistry Recent Labs  Lab 10/02/20 0508 10/02/20 1736 10/03/20 0822  NA 138 134* 138  K 3.3* 3.9 3.5  CL 104 99 105  CO2 27 26 25   GLUCOSE 110* 142* 136*  BUN 10 12 10   CREATININE 0.49 0.58 0.44  CALCIUM 8.5* 8.5* 8.4*  PROT  --  6.4*  --   ALBUMIN  --  2.8* 2.5*  AST  --  27  --   ALT  --  28  --   ALKPHOS  --  77  --  BILITOT  --  0.6  --   GFRNONAA >60 >60 >60  ANIONGAP 7 9 8      Hematology Recent Labs  Lab 09/29/20 0442 09/30/20 0430 10/02/20 1823  WBC 12.2* 8.4 14.8*  RBC 3.97 3.68* 4.02  HGB 12.1 10.9* 12.2  HCT 38.2 34.8* 37.0  MCV 96.2 94.6 92.0  MCH 30.5 29.6 30.3  MCHC 31.7 31.3 33.0  RDW 14.6 14.5 14.2  PLT 372 351 297    BNPNo results for input(s): BNP, PROBNP in the last 168 hours.   DDimer  Recent Labs  Lab 10/02/20 1736  DDIMER 3.61*     Radiology    DG Chest Port 1 View  Result Date: 10/02/2020 CLINICAL DATA:  Shortness of breath EXAM: PORTABLE CHEST 1 VIEW COMPARISON:  09/21/2020 FINDINGS: Heart and mediastinal contours are within normal limits. No focal opacities or effusions. No acute bony abnormality. IMPRESSION: No active disease. Electronically Signed   By: Rolm Baptise M.D.   On: 10/02/2020 15:38    Cardiac Studies    LHC/RHC  09/24/20: Conclusions: 1. Mild to moderate, non-obstructive coronary artery disease including 20-35% distal LMCA and 50% ostial LAD disease, similar to prior catheterization when FFR was not hemodynamically significant. 2. Patent proximal RCA stents with mild to moderate in-stent restenosis (30-40%). 3. Normal left and right heart filling pressures. 4. Normal pulmonary artery pressures. 5. Normal cardiac output/index.  Recommendations: 1. Maintain net even fluid balance. 2. Optimize goal-directed medical therapy with plans to repeat limited echo in 4-6 weeks. 3. Continue secondary prevention of coronary artery disease.  Echo 09/11/2020: 1. Recommend limited echo with iv contrast agent (definity) for addequate  assessment of LVEF and wall motion.. Left ventricular ejection fraction,  by estimation, is 25 to 30%. The left ventricle has severely decreased  function. Left ventricular  endocardial border not optimally defined to evaluate regional wall motion.  Left ventricular diastolic parameters are indeterminate.  2. Right ventricular systolic function is normal. The right ventricular  size is normal.  3. The mitral valve was not well visualized. Mild mitral valve  regurgitation.  4. The aortic valve was not well visualized. Aortic valve regurgitation  is not visualized.   Patient Profile     75 y.o. female with CAD status post RCA PCI in 2017 and in-stent restenosis requiring PCI, VF arrest,, chronic systolic diastolic heart failure, COPD, ongoing tobacco abuse, hypertension, hyperlipidemia, COPD, and narcotic abuse admitted with COPD exacerbation and Klebsiella pneumonia as well as acute on chronic systolic and diastolic heart failure.  Cardiology was initially consulted for elevated troponin.  Echo revealed new systolic dysfunction.  Hospital course was complicated by cardiogenic shock requiring milrinone for diuresis and hypoxic respiratory failure requiring intubation.  She  underwent left and right heart cath on 5/13 and was found to have mild to moderate nonobstructive CAD and normal filling pressures.  She also had SVT that was managed with metoprolol.  Cardiology signed off and now is very engaging due to recurrent tachycardia.  Assessment & Plan    SVT/Sinus tachycardia - heart artes up to 150s on 5/21, patient felt palpitations. Reviewed and felt to be Sinus tachycardia - started on IV amiodarone however discontinued - metoprolol 50mg  daily, extra 12.5mg  given for elevated rates monitor with soft pressures - continues to be in SR  CAD s/p PCI Elevated troponin - cardiac cath showed nonobstructive disease - likely demand ischemia - continue ASA, Plavix, metoprolol and statin  Acute systolic and diastolic heart failure - LVEF  reduced to 25-30% - heart cath as above - continue metoprolol and Entresto - further GDMT as BP allows  Sepsis due to ?PNA and possible UTI - transferred to ICU on Levo - per CCM  For questions or updates, please contact [place] Please consult [email or web address] for contact info under        Signed, Casin Federici Ninfa Meeker, PA-C  10/03/2020, 9:06 AM

## 2020-10-03 NOTE — Progress Notes (Signed)
PROGRESS NOTE    Kathryn Garrett  YTK:[id number] DOB: 12/11/1945 DOA: 09/10/2020 PCP: Imagene Riches, NP   Brief Narrative:  74/F presented to the [facility] ED from home with complaints of shortness of breath -Admitted on 4/29 with hypoxic respiratory failure secondary to COPD exacerbation and community-acquired pneumonia [facility] course was complicated by flash pulmonary edema and NSTEMI requiring mechanical ventilation, intubated on 4/30  Post extubation patient continued to slowly improve.  Initially was slated for CIR however unfortunately insurance denied.  Currently bed search initiated for skilled nursing facility.  Patient remains medically stable for discharge.  Received notification of case management that SNF had been denied by patient's insurance.  Appeal currently in progress.  5/22: Patient had acute onset of tachyarrhythmia yesterday.  Responded to bedside.  Ventricular rates approximately 60.  Unclear etiology as morphology.  Sinus.  Initially started amiodarone infusion however patient then dropped her blood pressure and became febrile.  Was transferred to the ICU overnight due to declining blood pressures despite aggressive fluid resuscitation.  Patient now in ICU.  Evidence of septic shock secondary to Klebsiella bacteremia.  On IV cefepime.  Mentating clearly.  Unclear source of sepsis.    Assessment & Plan:   Principal Problem:   Acute exacerbation of chronic obstructive pulmonary disease (COPD) (HCC) Active Problems:   CAP (community acquired pneumonia)   CAD in native artery   HTN (hypertension)   Tobacco abuse   Hyperlipidemia LDL goal <70   Severe sepsis (HCC)   NSTEMI (non-ST elevated myocardial infarction) (HCC)   Hyperglycemia   SOB (shortness of breath)   Acute respiratory failure (HCC)   HFrEF (heart failure with reduced ejection fraction) (HCC)   Acute on chronic HFrEF (heart failure with reduced ejection fraction) (HCC)   SVT (supraventricular  tachycardia) (HCC)   Acute combined systolic and diastolic heart failure ([place])   Pure hypercholesterolemia  Septic shock Secondary to Klebsiella bacteremia Unclear source of bacteremia Suspect urinary source but urine clean Could be impacted by previous exposure to antibiotics Is currently on vasopressor support in ICU Plan: Continue vasopressor support Wean as tolerated PCCM consultation Continue cefepime Monitor vitals and fever curve Can transfer back to floor once off pressors  Acute hypoxic respiratory failure  Acute COPD Exacerbation/left lung multifocal pneumonia Flash pulmonary edema on 4/30 -Intubated for flash pulmonary edema on, extubated 5/7 -Clinically improving, completed antibiotic course -Treated with diuretics as well, now off -Improved and stable, weaned off O2 -Discharge planning, CIR declined -Now bed search initiated for skilled nursing facility -[facility] aware and is following for placement options -Patient has had a complicated hospital course and is significantly debilitated.  In my opinion she is unsafe to return home.  She does have recovery potential and would benefit from standard short-term rehab.  Per case management appeal process has been initiated.  Sepsis, left lower lobe pneumonia Dysphagia -tracheal aspirate on 4/30 with KLEBSIELLA PNEUMONIAE (resistant to Ampicillin) -Completed 7 days of antibiotics. -Improving -SLP following, continues to have dysphagia, currently on dysphagia 3, honey thick liquids -Will need close SLP FU at rehab to monitor for continued improvement and changes to diet -Currently on honey thick liquids.  Intermittently tolerating p.o. intake.  Acute systolic congestive heart failure/Flash pulmonary edema NSTEMI Paroxysmal SVT -Echo noted with drop in EF down to 30%  -Briefly required inotropes in the ICU, this was then weaned off -Cardiology consulted, left heart cath noted nonobstructive coronary disease, recommended  medical management and repeat echo in 2 to 3  months -Continueaspirin, Plavix, metoprolol, Entresto -Clinically euvolemic at this time -Follow-up with Dr. Irish Lack in 2 to 3 weeks  Acute metabolic/toxic encephalopathy -ICU stay complicated by severe encephalopathy while on vent -At baseline on buprenorphine/naloxone which was held on admission, now resumed -Also had an MRI brain which was unremarkable -Improved and stable now, PT OT as tolerated -CIR recommended for rehab.  Declined.  SNF bed search initiated -SNF also declined.  Family filing appeal with [employer].  COPD Ongoing tobacco abuse -Counseled, tapered off prednisone, continue duo nebs   DVT prophylaxis: SQ Lovenox Code Status: Full Family Communication: Daughter [name] [phone] on 5/21 Disposition Plan: Status is: Inpatient  Remains inpatient appropriate because:Inpatient level of care appropriate due to severity of illness   Dispo: The patient is from: Home              Anticipated d/c is to: SNF              Patient currently is not medically stable to d/c.   Difficult to place patient No   Patient was awaiting placement.  Decompensated yesterday and currently on stepdown level of care for vasopressor support.  Disposition plan pending.           Level ofcare: Stepdown  Consultants:   Palliative care  Procedures:   None  Antimicrobials:   None   Subjective: Patient seen and examined.  Mentating clearly.  Appears a little fatigued.  Objective: Vitals:   10/03/20 1215 10/03/20 1230 10/03/20 1245 10/03/20 1300  BP: (!) 104/50 (!) 85/50 (!) 105/51 (!) 93/43  Pulse: 97 96 94 87  Resp: 15 15 15 18   Temp:      TempSrc:      SpO2: 98% 95% 99% 98%  Weight:      Height:        Intake/Output Summary (Last 24 hours) at 10/03/2020 1405 Last data filed at 10/03/2020 1300 Gross per 24 hour  Intake 1763.04 ml  Output 1600 ml  Net 163.04 ml   Filed Weights   10/01/20 0508 10/02/20  0334 10/03/20 0500  Weight: 55.1 kg 54.6 kg 54.6 kg    Examination:  General exam: Appears calm and comfortable.  Appears fatigued Respiratory system: Clear to auscultation. Respiratory effort normal. Cardiovascular system: S1 & S2 heard, RRR. No JVD, murmurs, rubs, gallops or clicks. No pedal edema. Gastrointestinal system: Abdomen is nondistended, soft and nontender. No organomegaly or masses felt. Normal bowel sounds heard. Central nervous system: Alert and oriented. No focal neurological deficits. Extremities: Symmetric 5 x 5 power. Skin: No rashes, lesions or ulcers Psychiatry: Judgement and insight appear normal. Mood & affect appropriate.     Data Reviewed: I have personally reviewed following labs and imaging studies  CBC: Recent Labs  Lab 09/28/20 0434 09/29/20 0442 09/30/20 0430 10/02/20 1823 10/03/20 0822  WBC 10.8* 12.2* 8.4 14.8* 8.6  NEUTROABS 5.8 7.3 4.6 12.9* 6.4  HGB 11.9* 12.1 10.9* 12.2 11.6*  HCT 38.3 38.2 34.8* 37.0 35.8*  MCV 95.8 96.2 94.6 92.0 93.5  PLT 339 372 351 297 [id number]   Basic Metabolic Panel: Recent Labs  Lab 09/27/20 0457 09/30/20 0430 10/02/20 0508 10/02/20 1736 10/03/20 0822  NA 136 137 138 134* 138  K 3.5 3.4* 3.3* 3.9 3.5  CL 105 106 104 99 105  CO2 22 24 27 26 25   GLUCOSE 117* 109* 110* 142* 136*  BUN 30* 20 10 12 10   CREATININE 0.48 0.56 0.49 0.58 0.44  CALCIUM 8.8* 8.5*  8.5* 8.5* 8.4*  MG  --   --   --   --  1.7  PHOS  --   --   --   --  3.3   GFR: Estimated Creatinine Clearance: 45.8 mL/min (by C-G formula based on SCr of 0.44 mg/dL). Liver Function Tests: Recent Labs  Lab 10/02/20 1736 10/03/20 0822  AST 27  --   ALT 28  --   ALKPHOS 77  --   BILITOT 0.6  --   PROT 6.4*  --   ALBUMIN 2.8* 2.5*   No results for input(s): LIPASE, AMYLASE in the last 168 hours. No results for input(s): AMMONIA in the last 168 hours. Coagulation Profile: Recent Labs  Lab 10/02/20 1736  INR 1.4*   Cardiac Enzymes: No results  for input(s): CKTOTAL, CKMB, CKMBINDEX, TROPONINI in the last 168 hours. BNP (last 3 results) No results for input(s): PROBNP in the last 8760 hours. HbA1C: No results for input(s): HGBA1C in the last 72 hours. CBG: Recent Labs  Lab 10/02/20 2108 10/02/20 2340 10/03/20 0357 10/03/20 0747 10/03/20 1131  GLUCAP 142* 151* 115* 122* 164*   Lipid Profile: No results for input(s): CHOL, HDL, LDLCALC, TRIG, CHOLHDL, LDLDIRECT in the last 72 hours. Thyroid Function Tests: No results for input(s): TSH, T4TOTAL, FREET4, T3FREE, THYROIDAB in the last 72 hours. Anemia Panel: No results for input(s): VITAMINB12, FOLATE, FERRITIN, TIBC, IRON, RETICCTPCT in the last 72 hours. Sepsis Labs: Recent Labs  Lab 10/02/20 1736 10/02/20 1824 10/02/20 2130  PROCALCITON 1.93  --   --   LATICACIDVEN  --  2.4* 1.1    Recent Results (from the past 240 hour(s))  Culture, blood (x 2)     Status: None (Preliminary result)   Collection Time: 10/02/20  6:24 PM   Specimen: BLOOD  Result Value Ref Range Status   Specimen Description BLOOD BLOOD RIGHT HAND  Final   Special Requests   Final    BOTTLES DRAWN AEROBIC AND ANAEROBIC Blood Culture adequate volume   Culture  Setup Time   Final    Organism ID to follow GRAM NEGATIVE RODS IN BOTH AEROBIC AND ANAEROBIC BOTTLES CRITICAL RESULT CALLED TO, READ BACK BY AND VERIFIED WITH: NATHAN BELUE AT 8502 ON 10/03/2020 [place]. Performed at [facility], [address] [place], [place] [address]    Culture GRAM NEGATIVE RODS  Final   Report Status PENDING  Incomplete  Blood Culture ID Panel (Reflexed)     Status: Abnormal   Collection Time: 10/02/20  6:24 PM  Result Value Ref Range Status   Enterococcus faecalis NOT DETECTED NOT DETECTED Final   Enterococcus Faecium NOT DETECTED NOT DETECTED Final   Listeria monocytogenes NOT DETECTED NOT DETECTED Final   Staphylococcus species NOT DETECTED NOT DETECTED Final   Staphylococcus aureus (BCID) NOT  DETECTED NOT DETECTED Final   Staphylococcus epidermidis NOT DETECTED NOT DETECTED Final   Staphylococcus lugdunensis NOT DETECTED NOT DETECTED Final   Streptococcus species NOT DETECTED NOT DETECTED Final   Streptococcus agalactiae NOT DETECTED NOT DETECTED Final   Streptococcus pneumoniae NOT DETECTED NOT DETECTED Final   Streptococcus pyogenes NOT DETECTED NOT DETECTED Final   A.calcoaceticus-baumannii NOT DETECTED NOT DETECTED Final   Bacteroides fragilis NOT DETECTED NOT DETECTED Final   Enterobacterales DETECTED (A) NOT DETECTED Final    Comment: Enterobacterales represent a large order of gram negative bacteria, not a single organism. CRITICAL RESULT CALLED TO, READ BACK BY AND VERIFIED WITH: NATHAN BELUE AT [place] ON 10/03/2020  [place].    Enterobacter cloacae complex NOT DETECTED NOT DETECTED Final   Escherichia coli NOT DETECTED NOT DETECTED Final   Klebsiella aerogenes NOT DETECTED NOT DETECTED Final   Klebsiella oxytoca NOT DETECTED NOT DETECTED Final   Klebsiella pneumoniae DETECTED (A) NOT DETECTED Corrected    Comment: CORRECTED ON 05/22 AT 0825: PREVIOUSLY REPORTED AS DETECTED CRITICAL RESULT CALLED TO, READ BACK BY AND VERIFIED WITH: NATHAN BELUE AT 0708 ON 10/03/2020 [place].   Proteus species NOT DETECTED NOT DETECTED Final   Salmonella species NOT DETECTED NOT DETECTED Final   Serratia marcescens NOT DETECTED NOT DETECTED Final   Haemophilus influenzae NOT DETECTED NOT DETECTED Final   Neisseria meningitidis NOT DETECTED NOT DETECTED Final   Pseudomonas aeruginosa NOT DETECTED NOT DETECTED Final   Stenotrophomonas maltophilia NOT DETECTED NOT DETECTED Final   Candida albicans NOT DETECTED NOT DETECTED Final   Candida auris NOT DETECTED NOT DETECTED Final   Candida glabrata NOT DETECTED NOT DETECTED Final   Candida krusei NOT DETECTED NOT DETECTED Final   Candida parapsilosis NOT DETECTED NOT DETECTED Final   Candida tropicalis NOT DETECTED NOT DETECTED Final   Cryptococcus  neoformans/gattii NOT DETECTED NOT DETECTED Final   CTX-M ESBL NOT DETECTED NOT DETECTED Final   Carbapenem resistance IMP NOT DETECTED NOT DETECTED Final   Carbapenem resistance KPC NOT DETECTED NOT DETECTED Final   Carbapenem resistance NDM NOT DETECTED NOT DETECTED Final   Carbapenem resist OXA 48 LIKE NOT DETECTED NOT DETECTED Final   Carbapenem resistance VIM NOT DETECTED NOT DETECTED Final    Comment: Performed at [facility], [address] [place], [place] [address]  Culture, blood (x 2)     Status: None (Preliminary result)   Collection Time: 10/02/20  6:32 PM   Specimen: BLOOD  Result Value Ref Range Status   Specimen Description BLOOD RIGHT ANTECUBITAL  Final   Special Requests   Final    BOTTLES DRAWN AEROBIC AND ANAEROBIC Blood Culture results may not be optimal due to an excessive volume of blood received in culture bottles   Culture  Setup Time   Final    GRAM NEGATIVE RODS IN BOTH AEROBIC AND ANAEROBIC BOTTLES CRITICAL VALUE NOTED.  VALUE IS CONSISTENT WITH PREVIOUSLY REPORTED AND CALLED VALUE. Performed at [facility], [address] [place], [place] [address]    Culture GRAM NEGATIVE RODS  Final   Report Status PENDING  Incomplete         Radiology Studies: DG Chest Port 1 View  Result Date: 10/02/2020 CLINICAL DATA:  Shortness of breath EXAM: PORTABLE CHEST 1 VIEW COMPARISON:  09/21/2020 FINDINGS: Heart and mediastinal contours are within normal limits. No focal opacities or effusions. No acute bony abnormality. IMPRESSION: No active disease. Electronically Signed   By: Rolm Baptise M.D.   On: 10/02/2020 15:38        Scheduled Meds: . aspirin  81 mg Oral QHS  . budesonide (PULMICORT) nebulizer solution  0.25 mg Nebulization BID  . buprenorphine-naloxone  2 tablet Sublingual Daily  . Chlorhexidine Gluconate Cloth  6 each Topical QHS  . clopidogrel  75 mg Oral QHS  . fluticasone  1 spray Each Nare Daily  . insulin aspart  0-15  Units Subcutaneous Q4H  . levothyroxine  88 mcg Oral Q0600  . loratadine  10 mg Oral Daily  . mouth rinse  15 mL Mouth Rinse BID  . metoprolol succinate  50 mg Oral Daily  . midodrine  5 mg Oral  TID WC  . multivitamin with minerals  1 tablet Oral Daily  . pantoprazole  40 mg Oral Daily  . rosuvastatin  40 mg Oral QHS  . sodium chloride flush  3 mL Intravenous Q12H  . sodium chloride flush  3 mL Intravenous Q12H   Continuous Infusions: . sodium chloride Stopped (09/24/20 1321)  . sodium chloride 250 mL (10/02/20 2210)  . ceFEPime (MAXIPIME) IV Stopped (10/03/20 1110)  . norepinephrine (LEVOPHED) Adult infusion 4 mcg/min (10/03/20 1300)     LOS: 23 days    Time spent: 25 minutes    Sidney Ace, MD [facility] Pager [phone]  If 7PM-7AM, please contact night-coverage 10/03/2020, 2:05 PM

## 2020-10-03 NOTE — Consult Note (Signed)
PHARMACY - PHYSICIAN COMMUNICATION CRITICAL VALUE ALERT - BLOOD CULTURE IDENTIFICATION (BCID)  Kathryn Garrett is an 75 y.o. female who presented to [facility] on 09/10/2020 with a chief complaint of acute onset tachyarrhythmia and sepsis  Assessment:  3 out 4 bottles growing GNR BCID positive for klebsiella pneumoniae ESBL not detected.  (include suspected source if known)  Name of physician (or Provider) Contacted: Dr. Priscella Mann  Current antibiotics: cefepime  Changes to prescribed antibiotics recommended: ceftriaxone  Recommendations declined by provider due to clinical deterioration  Results for orders placed or performed during the hospital encounter of 09/10/20  Blood Culture ID Panel (Reflexed) (Collected: 10/02/2020  6:24 PM)  Result Value Ref Range   Enterococcus faecalis NOT DETECTED NOT DETECTED   Enterococcus Faecium NOT DETECTED NOT DETECTED   Listeria monocytogenes NOT DETECTED NOT DETECTED   Staphylococcus species NOT DETECTED NOT DETECTED   Staphylococcus aureus (BCID) NOT DETECTED NOT DETECTED   Staphylococcus epidermidis NOT DETECTED NOT DETECTED   Staphylococcus lugdunensis NOT DETECTED NOT DETECTED   Streptococcus species NOT DETECTED NOT DETECTED   Streptococcus agalactiae NOT DETECTED NOT DETECTED   Streptococcus pneumoniae NOT DETECTED NOT DETECTED   Streptococcus pyogenes NOT DETECTED NOT DETECTED   A.calcoaceticus-baumannii NOT DETECTED NOT DETECTED   Bacteroides fragilis NOT DETECTED NOT DETECTED   Enterobacterales DETECTED (A) NOT DETECTED   Enterobacter cloacae complex NOT DETECTED NOT DETECTED   Escherichia coli NOT DETECTED NOT DETECTED   Klebsiella aerogenes NOT DETECTED NOT DETECTED   Klebsiella oxytoca NOT DETECTED NOT DETECTED   Klebsiella pneumoniae DETECTED (A) NOT DETECTED   Proteus species NOT DETECTED NOT DETECTED   Salmonella species NOT DETECTED NOT DETECTED   Serratia marcescens NOT DETECTED NOT DETECTED   Haemophilus influenzae NOT  DETECTED NOT DETECTED   Neisseria meningitidis NOT DETECTED NOT DETECTED   Pseudomonas aeruginosa NOT DETECTED NOT DETECTED   Stenotrophomonas maltophilia NOT DETECTED NOT DETECTED   Candida albicans NOT DETECTED NOT DETECTED   Candida auris NOT DETECTED NOT DETECTED   Candida glabrata NOT DETECTED NOT DETECTED   Candida krusei NOT DETECTED NOT DETECTED   Candida parapsilosis NOT DETECTED NOT DETECTED   Candida tropicalis NOT DETECTED NOT DETECTED   Cryptococcus neoformans/gattii NOT DETECTED NOT DETECTED   CTX-M ESBL NOT DETECTED NOT DETECTED   Carbapenem resistance IMP NOT DETECTED NOT DETECTED   Carbapenem resistance KPC NOT DETECTED NOT DETECTED   Carbapenem resistance NDM NOT DETECTED NOT DETECTED   Carbapenem resist OXA 48 LIKE NOT DETECTED NOT DETECTED   Carbapenem resistance VIM NOT DETECTED NOT DETECTED    Oswald Hillock 10/03/2020  10:28 AM

## 2020-10-03 NOTE — Progress Notes (Signed)
Pt on levophed at 8 mcgs via PIV. Blood return noted and no ss infiltration. Pt rested overnight with no acute issues

## 2020-10-03 NOTE — Progress Notes (Signed)
NAME:  Kathryn Garrett, MRN:  [id number], DOB:  Jun 06, 1945, LOS: 23 ADMISSION DATE:  09/10/2020, INITIAL CONSULTATION DATE:  09/11/2020 REFERRING MD: Dr. Damita Dunnings, CHIEF COMPLAINT: SOB  Brief Patient Description  [name] is a 75F with CAD status post RCA PCI in 2017 and in-stent restenosis requiring PCI, VF arrest,, chronic systolic diastolic heart failure, COPD, ongoing tobacco abuse, hypertension, hyperlipidemia, COPD, and narcotic abuse admitted with COPD exacerbation and Klebsiella pneumonia as well as acute on chronic systolic and diastolic heart failure.  Hospital course was complicated by cardiogenic shock requiring milrinone for diuresis and hypoxic respiratory failure requiring intubation. She was treated with Cefepime/Ancef for aspiration. She underwent left and right heart cath on 5/13 and was found to have mild to moderate nonobstructive CAD and normal filling pressures. She also had SVT that was managed with metoprolol. Patient was extubated and transferred to the floor. On 5/21 she was noted to be hypotensive despite IVFs and in sinus tachycardia to the 150s.  Lacate was 2.4.  Blood cultures with GNRs.  WBC was 14.8.  No significant infection in urine. CXR was unremarkable.  She was treated with Cefepime for Klebsiella pneumonia and transferred to ICU.  SHe was started on levophed for septic shock and PCCM re-consulted to assist with management.  Pertinent  Medical History  CAD status post RCA PCI in 2017 and in-stent restenosis requiring PCI,  VF arrest,,  chronic systolic diastolic heart failure,  COPD,  ongoing tobacco abuse,  hypertension,  hyperlipidemia,  Chronic narcotic abuse    Significant Hospital Events: Including procedures, antibiotic start and stop dates in addition to other pertinent events   09/10/2020-patient admits admitted with hospitalist service for COPD exacerbation and left lower lobe pneumonia 09/11/2020-overnight patient had suspected flash pulmonary  edema, dyspnea followed by somnolence requiring emergent intubation and mechanical ventilation.  Admitted to ICU 09/12/20- patient failed SBT , she had blood tinged secretions fromETT and aggitation, family was at bedside and we agreed on giving her more time and try again.  09/14/20- Failed SBT (became hypoxic with sats mid 80's, increased WOB and assessory muscle use, HR 140-160's). Will diurese today and add Metoprolol.  Tracheal aspirate from 4/30 with KLEBSIELLA PNEUMONIAE (resistant to Ampicillin)   09/15/20- Failed SBT (increased WOB and assessory muscle use, RR 40's, HR 140-150's, Hypertensive); plan to add scheduled PO Klonopin and Oxycodone, Lasix 40 mg IV x1, consult Palliative Care 09/16/20- Worsening Leukocytosis up to 21.7, low grade fever overnight, increased secretions overnight, will repeat Tracheal aspirate, remove central line, add mucinex, plan for SBT 09/17/20-[name] passed SBT in AM and following commands still having some moderate secretions 09/18/20-  Extubated yesterday.  Failed swallow eval today.  09/20/20- Weaned to room air, Leukocytosis improving; Hemodynamically stable 09/27/20- patient is coughing up phlegm but clinically is improved, we discussed pneumonia and COPD with outpatient clinic follow up with pulmonology.  There is plan for post dc rehab 09/28/20- patient is improved and she is being optimized for CIR, pulmonary will sign off and can follow up on outpatient basis 09/29/20- family at bedside today.  We discussed barium swallow with aspiration.  09/30/20- patient had PT today she walked today without need of additional supplemental O2 therapy.  She generally has been walking ok but has had severe LE weakness here.  She ate lunch infront of me had cough after couple bites.  We disucssed aspiartion risk and her barium swallow.  10/01/20: PCCM signed off  10/02/20: Pt with hypotension despite aggressive fluid resuscitation  concerning for sepsis secondary to UTI requiring levophed  gtt and transfer to stepdown unit.  PCCM signed back on case to assist with management  10/03/20: Remains on Levophed  Cultures:  4/29: SARS-CoV-2 PCR>> negative 4/29:Influenza PCR>> negative 5/1: Strep pneumo urinary antigen>>negative 5/1: Legionella urinary antigen>>negative 5/5: Tracheal aspirate> candida albicans 5/21: Blood culture x2>>3 out 4 bottles growing GNR BCID positive for klebsiella pneumoniae ESBL not detected 5/21: UCx> pending  Antimicrobials:  Ceftriaxone 5/22>> Cefepime 5/21 x 1 dose Metronidazole 5/21 x 1 dose  Interim History / Subjective:  Off Bipap this morning Remains on pressor  OBJECTIVE   Blood pressure (!) 109/53, pulse 98, temperature 98.8 F (37.1 C), temperature source Axillary, resp. rate 17, height 4\' 11"  (1.499 m), weight 54.6 kg, SpO2 99 %.        Intake/Output Summary (Last 24 hours) at 10/03/2020 1048 Last data filed at 10/03/2020 1033 Gross per 24 hour  Intake 1435.51 ml  Output 1600 ml  Net -164.49 ml   Filed Weights   10/01/20 0508 10/02/20 0334 10/03/20 0500  Weight: 55.1 kg 54.6 kg 54.6 kg   Physical Examination: GENERAL:75 year-old patient lying in the bed with no acute distress.  EYES: Pupils equal, round, reactive to light and accommodation. No scleral icterus. Extraocular muscles intact.  HEENT: Head atraumatic, normocephalic. Oropharynx and nasopharynx clear.  NECK:  Supple, no jugular venous distention. No thyroid enlargement, no tenderness.  LUNGS: Decreased breath sounds bilaterally, no wheezing, rales,rhonchi or crepitation. No use of accessory muscles of respiration.  CARDIOVASCULAR: S1, S2 normal. No murmurs, rubs, or gallops.  ABDOMEN: Soft, nontender, nondistended. Bowel sounds present. No organomegaly or mass.  EXTREMITIES: No pedal edema, cyanosis, or clubbing.  NEUROLOGIC: Cranial nerves II through XII are intact. Muscle strength 5/5 in all extremities. Sensation intact. Gait not checked.  PSYCHIATRIC: The patient  is alert and oriented x 3.  SKIN: No obvious rash, lesion, or ulcer.   Labs/imaging that I havepersonally reviewed  (right click and "Reselect all SmartList Selections" daily)   Labs 09/20/2020: WBC 20.1, K 3.0, glucose 124, BUN 68, Cr. 0.66 Chest x-ray 09/16/2020>>Endotracheal tube, NG tube, right IJ line in stable position. Heart size normal. Persistent but improved bilateral interstitial infiltrates. Tiny left pleural effusion cannot be excluded. No pneumothorax. MRI brain 09/13/2020:No acute brain finding. Mild chronic small-vessel ischemic change of the white matter, fairly typical for age. Labs 05/21: Na+ 134, glucose 142, albumin 2.8, pct 1.93, wbc 14.8, lactic acid 2.4, d-dimer 3.61 CXR 05/21: No active disease   Labs   CBC: Recent Labs  Lab 09/28/20 0434 09/29/20 0442 09/30/20 0430 10/02/20 1823 10/03/20 0822  WBC 10.8* 12.2* 8.4 14.8* 8.6  NEUTROABS 5.8 7.3 4.6 12.9* 6.4  HGB 11.9* 12.1 10.9* 12.2 11.6*  HCT 38.3 38.2 34.8* 37.0 35.8*  MCV 95.8 96.2 94.6 92.0 93.5  PLT 339 372 351 297 [id number]    Basic Metabolic Panel: Recent Labs  Lab 09/27/20 0457 09/30/20 0430 10/02/20 0508 10/02/20 1736 10/03/20 0822  NA 136 137 138 134* 138  K 3.5 3.4* 3.3* 3.9 3.5  CL 105 106 104 99 105  CO2 22 24 27 26 25   GLUCOSE 117* 109* 110* 142* 136*  BUN 30* 20 10 12 10   CREATININE 0.48 0.56 0.49 0.58 0.44  CALCIUM 8.8* 8.5* 8.5* 8.5* 8.4*  MG  --   --   --   --  1.7  PHOS  --   --   --   --  3.3  GFR: Estimated Creatinine Clearance: 45.8 mL/min (by C-G formula based on SCr of 0.44 mg/dL). Recent Labs  Lab 09/29/20 0442 09/30/20 0430 10/02/20 1736 10/02/20 1823 10/02/20 1824 10/02/20 2130 10/03/20 0822  PROCALCITON  --   --  1.93  --   --   --   --   WBC 12.2* 8.4  --  14.8*  --   --  8.6  LATICACIDVEN  --   --   --   --  2.4* 1.1  --     Liver Function Tests: Recent Labs  Lab 10/02/20 1736 10/03/20 0822  AST 27  --   ALT 28  --   ALKPHOS 77  --   BILITOT 0.6   --   PROT 6.4*  --   ALBUMIN 2.8* 2.5*   No results for input(s): LIPASE, AMYLASE in the last 168 hours. No results for input(s): AMMONIA in the last 168 hours.  ABG    Component Value Date/Time   PHART 7.38 09/15/2020 0500   PCO2ART 41 09/15/2020 0500   PO2ART 123 (H) 09/15/2020 0500   HCO3 33.5 (H) 09/17/2020 0944   TCO2 23 01/14/2009 0327   ACIDBASEDEF 0.8 09/15/2020 0500   O2SAT 88.8 09/17/2020 0944     Coagulation Profile: Recent Labs  Lab 10/02/20 1736  INR 1.4*    Cardiac Enzymes: No results for input(s): CKTOTAL, CKMB, CKMBINDEX, TROPONINI in the last 168 hours.  HbA1C: Hgb A1c MFr Bld  Date/Time Value Ref Range Status  09/11/2020 04:37 AM 6.4 (H) 4.8 - 5.6 % Final    Comment:    (NOTE) Pre diabetes:          5.7%-6.4%  Diabetes:              >6.4%  Glycemic control for   <7.0% adults with diabetes   10/02/2019 12:28 PM 6.4 (H) 4.8 - 5.6 % Final    Comment:             Prediabetes: 5.7 - 6.4          Diabetes: >6.4          Glycemic control for adults with diabetes: <7.0     CBG: Recent Labs  Lab 10/02/20 2044 10/02/20 2108 10/02/20 2340 10/03/20 0357 10/03/20 0747  GLUCAP 136* 142* 151* 115* 122*    Allergies Allergies  Allergen Reactions  . Azithromycin Rash     Home Medications  Prior to Admission medications   Medication Sig Start Date End Date Taking? Authorizing Provider  acetaminophen (TYLENOL) 500 MG tablet Take 1,000 mg by mouth every 6 (six) hours as needed for headache.   Yes [provider]  albuterol (PROVENTIL HFA;VENTOLIN HFA) 108 (90 Base) MCG/ACT inhaler Inhale 2 puffs into the lungs every 4 (four) hours as needed for wheezing or shortness of breath.   Yes [provider]  amoxicillin (AMOXIL) 500 MG tablet Take 500 mg by mouth 3 (three) times daily. 08/03/20  Yes [provider]  aspirin EC 81 MG tablet Take 81 mg by mouth at bedtime.    Yes [provider]  azelastine (ASTELIN) 0.1  % nasal spray USE 2 SPRAYS BID UNTIL DIRECTED TO STOP. USE FOR RUNNY NOSE 08/14/17  Yes [provider]  Buprenorphine HCl-Naloxone HCl 8-2 MG FILM Place 0.5 Film under the tongue daily.   Yes [provider]  cetirizine (ZYRTEC) 10 MG tablet Take 1 tablet (10 mg total) by mouth daily. 03/11/18  Yes Waldon Merl,  PA-C  clopidogrel (PLAVIX) 75 MG tablet TAKE 1 TABLET(75 MG) BY MOUTH AT BEDTIME 09/16/20  Yes Jettie Booze, MD  dexamethasone (DECADRON) 6 MG tablet dexamethasone 6 mg tablet  TAKE 1 TABLET BY MOUTH EVERY DAY   Yes [provider]  doxycycline (VIBRAMYCIN) 100 MG capsule doxycycline hyclate 100 mg capsule  TAKE 1 CAPSULE BY MOUTH TWICE DAILY   Yes [provider]  fluticasone (FLONASE) 50 MCG/ACT nasal spray Place 1 spray into both nostrils daily.   Yes [provider]  furosemide (LASIX) 40 MG tablet Take 1 tablet (40 mg total) by mouth daily as needed (swelling). 01/16/20  Yes Jettie Booze, MD  ibuprofen (ADVIL,MOTRIN) 600 MG tablet Take 600 mg by mouth as needed for pain. 08/14/17  Yes [provider]  isosorbide mononitrate (IMDUR) 30 MG 24 hr tablet TAKE 1 TABLET(30 MG) BY MOUTH DAILY 09/16/20  Yes Jettie Booze, MD  levothyroxine (SYNTHROID) 88 MCG tablet Take 88 mcg by mouth every morning. 05/26/20  Yes [provider]  metoprolol tartrate (LOPRESSOR) 25 MG tablet TAKE 1 TABLET BY MOUTH TWICE DAILY 09/16/20  Yes Jettie Booze, MD  mupirocin ointment (BACTROBAN) 2 % mupirocin 2 % topical ointment  APPLY TOPICALLY TO LOWER LEG THREE TIMES DAILY FOR 10 DAYS   Yes [provider]  nystatin cream (MYCOSTATIN) Apply topically 2 (two) times daily. 08/11/20  Yes [provider]  potassium chloride SA (KLOR-CON) 20 MEQ tablet Take 1 tablet (20 mEq total) by mouth daily as needed (Take with furosemide (lasix)). 01/16/20  Yes Jettie Booze, MD  predniSONE (DELTASONE) 20 MG tablet prednisone  20 mg tablet   Yes [provider]  rosuvastatin (CRESTOR) 20 MG tablet TAKE 1 TABLET(20 MG) BY MOUTH DAILY 09/16/20  Yes Jettie Booze, MD  TRELEGY ELLIPTA 100-62.5-25 MCG/INH AEPB Take 1 puff by mouth daily. 08/13/20  Yes [provider]  triamcinolone cream (KENALOG) 0.1 % Apply topically 2 (two) times daily. to affected area 08/11/20  Yes [provider]  metoprolol succinate (TOPROL-XL) 50 MG 24 hr tablet Take 1 tablet (50 mg total) by mouth daily. Take with or immediately following a meal. 09/29/20   Domenic Polite, MD  nitroGLYCERIN (NITROSTAT) 0.4 MG SL tablet Place 1 tablet (0.4 mg total) under the tongue every 5 (five) minutes as needed for chest pain. 03/12/18   Jettie Booze, MD  pantoprazole (PROTONIX) 40 MG tablet Take 1 tablet (40 mg total) by mouth daily. 09/29/20   Domenic Polite, MD  sacubitril-valsartan (ENTRESTO) 24-26 MG Take 1 tablet by mouth 2 (two) times daily. 09/28/20   Domenic Polite, MD   Scheduled Meds: . aspirin  81 mg Oral QHS  . budesonide (PULMICORT) nebulizer solution  0.25 mg Nebulization BID  . buprenorphine-naloxone  2 tablet Sublingual Daily  . Chlorhexidine Gluconate Cloth  6 each Topical QHS  . clopidogrel  75 mg Oral QHS  . fluticasone  1 spray Each Nare Daily  . insulin aspart  0-15 Units Subcutaneous Q4H  . levothyroxine  88 mcg Oral Q0600  . loratadine  10 mg Oral Daily  . mouth rinse  15 mL Mouth Rinse BID  . metoprolol succinate  50 mg Oral Daily  . multivitamin with minerals  1 tablet Oral Daily  . pantoprazole  40 mg Oral Daily  . rosuvastatin  40 mg Oral QHS  . sodium chloride flush  3 mL Intravenous Q12H  . sodium chloride flush  3 mL  Intravenous Q12H   Continuous Infusions: . sodium chloride Stopped (09/24/20 1321)  . sodium chloride 250 mL (10/02/20 2210)  . ceFEPime (MAXIPIME) IV 2 g (10/03/20 1033)  . norepinephrine (LEVOPHED) Adult infusion 8 mcg/min (10/03/20 0800)   PRN Meds:.acetaminophen  **OR** acetaminophen, albuterol, artificial tears, docusate, guaiFENesin, polyethylene glycol  Resolved Hospital Problem list     ASSESSMENT & PLAN  Acute hypoxic respiratory failure secondary to Acute COPD Exacerbation & left lung multifocal pneumonia -S/p Extubation 5/7 -Supplemental O2 as needed to maintain O2 sats 88 to 94% -Follow intermittent CXR -Bronchodilators -Continue Budesonide nebs -Aggressive pulmonary toilet -PT/OT  Sepsis due to left lung multifocal pneumonia and possible UT?I>> Tracheal aspirate on 4/30 with KLEBSIELLA PNEUMONIAE (resistant to Ampicillin)>>treated Blood Cx 5/21>3 out 4 bottles growing GNR BCID positive for klebsiella pneumoniae ESBL not detected -Monitor fever curve -Trend wbc  -Follow Blood cultures and UA  -Completed 7 days of Cefepime/Ancef; However, on 05/21 cefepime AND metronidazole resumed due to concerns of septic shock secondary to possible UTI. Blood cultures as above, abx changed to Ceftriaxone.  Acute systolic congestive heart failure Acute non-ST elevation MI s/p cardiac cath 05/13 recommendation medical management  New diagnosis of A. fib with RVR -Continuous cardiac monitoring -Patient's ejection fraction on echocardiogram is 30% -Diuresis as renal function and BP permits >> currently on 40 mg IV Lasix daily and will continue -Cardiology following, input is appreciated -Hold entresto and immediate release metoprolol due to hypotension  -Continue aspirin, plavix, and statin -Off Amiodarone  -Cardiology input appreciated  Acute metabolic/toxic encephalopathy>>resolved  -Continue outpatient Suboxone due to narcotic abuse hx   Best practice (right click and "Reselect all SmartList Selections" daily)   Diet: DYS 3 Pain/Anxiety/Delirium protocol: not indicated  VAP protocol: not indicated  DVT prophylaxis: SCD and subq lovenox  GI prophylaxis: H2B Glucose control:  SSI Yes Central venous access: n/a Arterial line:   N/A Foley: N/A Mobility: Out of bed with assistance as tolerated PT consulted: needs Pt/OT Code Status:  full code Disposition: Stepdown  Critical care time: [place], DNP, FNP-C, AGACNP-BC Acute Care Nurse Practitioner  [facility] Pager: [phone] [place] [facility]

## 2020-10-03 NOTE — Progress Notes (Signed)
PT Cancellation Note  Patient Details Name: Kathryn Garrett MRN: [id number] DOB: 08-25-1945   Cancelled Treatment:    Reason Eval/Treat Not Completed: Medical issues which prohibited therapy. Patient has been transferred to ICU for blood pressure management with pressor support required. Noted most recent blood pressure of 93/43. Awaiting continue at transfer order/new PT order to resume PT when medically appropriate.   Minna Merritts, PT, MPT  Percell Locus 10/03/2020, 1:16 PM

## 2020-10-04 ENCOUNTER — Inpatient Hospital Stay: Payer: Medicare HMO

## 2020-10-04 DIAGNOSIS — J441 Chronic obstructive pulmonary disease with (acute) exacerbation: Secondary | ICD-10-CM | POA: Diagnosis not present

## 2020-10-04 DIAGNOSIS — R0902 Hypoxemia: Secondary | ICD-10-CM | POA: Diagnosis not present

## 2020-10-04 DIAGNOSIS — I428 Other cardiomyopathies: Secondary | ICD-10-CM | POA: Diagnosis not present

## 2020-10-04 DIAGNOSIS — J189 Pneumonia, unspecified organism: Secondary | ICD-10-CM | POA: Diagnosis not present

## 2020-10-04 LAB — PHOSPHORUS: Phosphorus: 2.9 mg/dL (ref 2.5–4.6)

## 2020-10-04 LAB — GLUCOSE, CAPILLARY
Glucose-Capillary: 105 mg/dL — ABNORMAL HIGH (ref 70–99)
Glucose-Capillary: 112 mg/dL — ABNORMAL HIGH (ref 70–99)
Glucose-Capillary: 117 mg/dL — ABNORMAL HIGH (ref 70–99)
Glucose-Capillary: 124 mg/dL — ABNORMAL HIGH (ref 70–99)
Glucose-Capillary: 126 mg/dL — ABNORMAL HIGH (ref 70–99)
Glucose-Capillary: 130 mg/dL — ABNORMAL HIGH (ref 70–99)
Glucose-Capillary: 130 mg/dL — ABNORMAL HIGH (ref 70–99)

## 2020-10-04 LAB — CBC WITH DIFFERENTIAL/PLATELET
Abs Immature Granulocytes: 0.02 10*3/uL (ref 0.00–0.07)
Basophils Absolute: 0 10*3/uL (ref 0.0–0.1)
Basophils Relative: 0 %
Eosinophils Absolute: 0.2 10*3/uL (ref 0.0–0.5)
Eosinophils Relative: 4 %
HCT: 32.5 % — ABNORMAL LOW (ref 36.0–46.0)
Hemoglobin: 10.4 g/dL — ABNORMAL LOW (ref 12.0–15.0)
Immature Granulocytes: 0 %
Lymphocytes Relative: 22 %
Lymphs Abs: 1.2 10*3/uL (ref 0.7–4.0)
MCH: 30.2 pg (ref 26.0–34.0)
MCHC: 32 g/dL (ref 30.0–36.0)
MCV: 94.5 fL (ref 80.0–100.0)
Monocytes Absolute: 0.7 10*3/uL (ref 0.1–1.0)
Monocytes Relative: 12 %
Neutro Abs: 3.4 10*3/uL (ref 1.7–7.7)
Neutrophils Relative %: 62 %
Platelets: 270 10*3/uL (ref 150–400)
RBC: 3.44 MIL/uL — ABNORMAL LOW (ref 3.87–5.11)
RDW: 14.3 % (ref 11.5–15.5)
Smear Review: NORMAL
WBC: 5.5 10*3/uL (ref 4.0–10.5)
nRBC: 0 % (ref 0.0–0.2)

## 2020-10-04 LAB — BASIC METABOLIC PANEL
Anion gap: 7 (ref 5–15)
BUN: 8 mg/dL (ref 8–23)
CO2: 26 mmol/L (ref 22–32)
Calcium: 8.4 mg/dL — ABNORMAL LOW (ref 8.9–10.3)
Chloride: 108 mmol/L (ref 98–111)
Creatinine, Ser: 0.44 mg/dL (ref 0.44–1.00)
GFR, Estimated: >60 mL/min (ref >60)
Glucose, Bld: 123 mg/dL — ABNORMAL HIGH (ref 70–99)
Potassium: 3.5 mmol/L (ref 3.5–5.1)
Sodium: 141 mmol/L (ref 135–145)

## 2020-10-04 LAB — MAGNESIUM: Magnesium: 1.8 mg/dL (ref 1.7–2.4)

## 2020-10-04 LAB — URINE CULTURE: Culture: NO GROWTH

## 2020-10-04 IMAGING — MR MR CERVICAL SPINE WO/W CM
5 of 8 series · 25 of 48 positions shown · IV contrast (gadavist)
Comparison: None available.

CLINICAL DATA: Initial evaluation for epidural abscess. Klebsiella
bacteremia.

EXAM:
MRI CERVICAL, THORACIC AND LUMBAR SPINE WITHOUT AND WITH CONTRAST
TECHNIQUE: Multiplanar and multiecho pulse sequences of the cervical spine, to
include the craniocervical junction and cervicothoracic junction,
and thoracic and lumbar spine, were obtained without and with
intravenous contrast.
CONTRAST:  5mL GADAVIST GADOBUTROL 1 MMOL/ML IV SOLN

[Series 25: T2 · sagittal · 3.0mm · 0.62mm/px · 4 of 15 slices shown (1 of 2)]
[im 1/15]
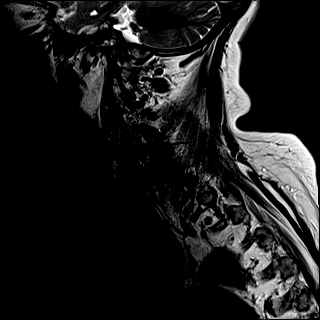
[im 5/15]
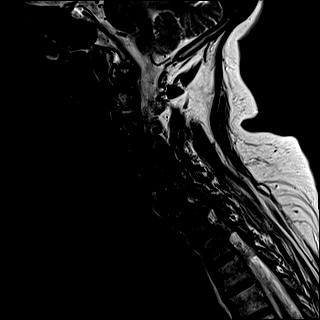
[im 10/15]
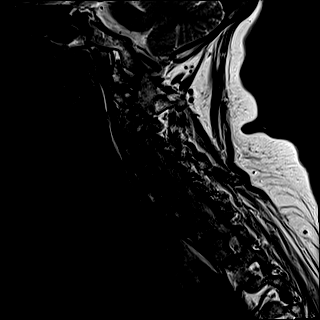
[im 15/15]
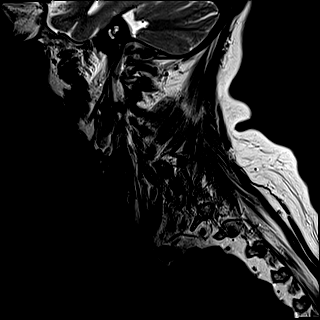

[Series 27: STIR · sagittal · 3.0mm · 0.62mm/px · 4 of 15 slices shown]
[im 1/15]
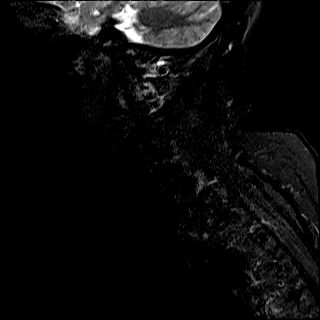
[im 5/15]
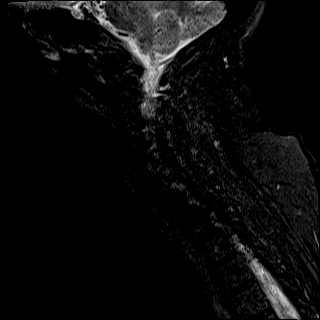
[im 10/15]
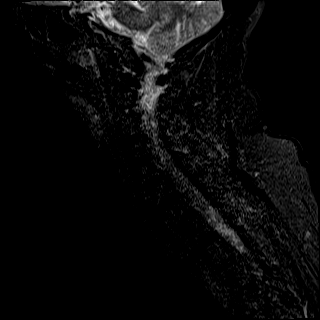
[im 15/15]
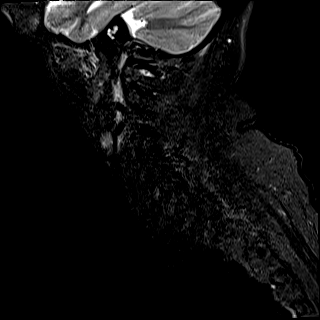

[Series 28: T2 · axial · 3.0mm · 0.70mm/px · z∈[-108,-16]mm · 8 of 29 slices shown (2 of 2)]
[im 1/29]
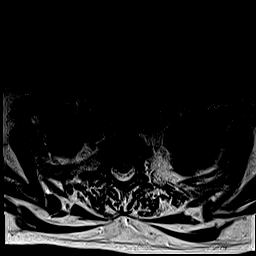
[im 5/29]
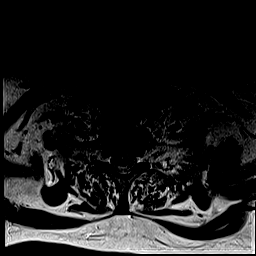
[im 9/29]
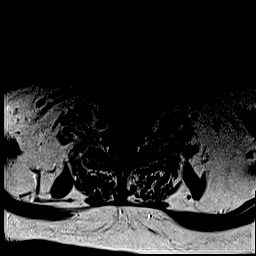
[im 13/29]
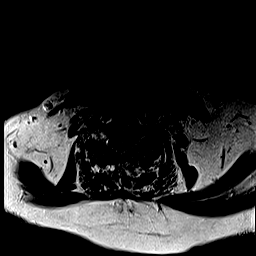
[im 17/29]
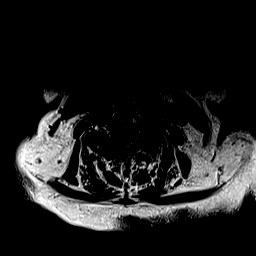
[im 21/29]
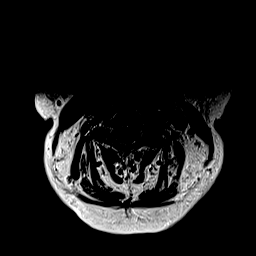
[im 25/29]
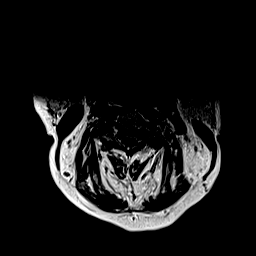
[im 29/29]
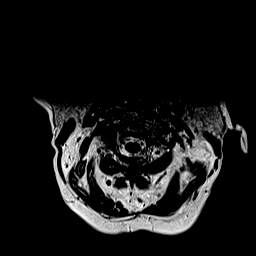

[Series 30: T1 · axial · non-contrast · 3.0mm · 0.35mm/px · z∈[-108,-16]mm · 8 of 29 slices shown]
[im 1/29]
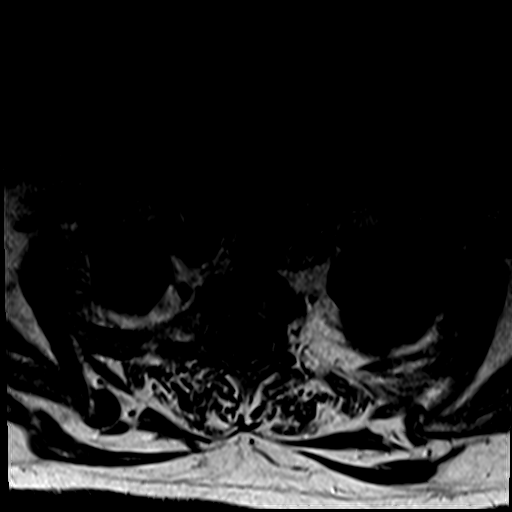
[im 5/29]
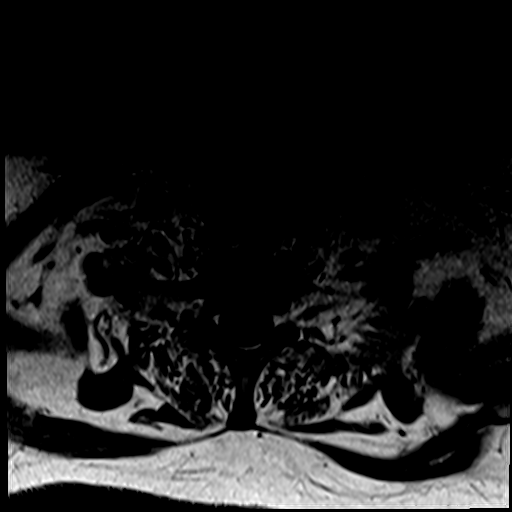
[im 9/29]
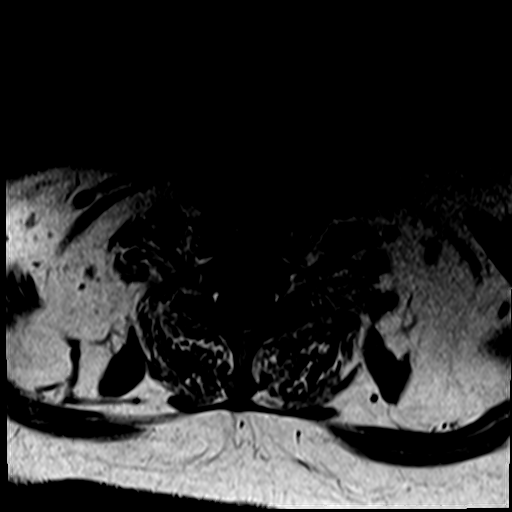
[im 13/29]
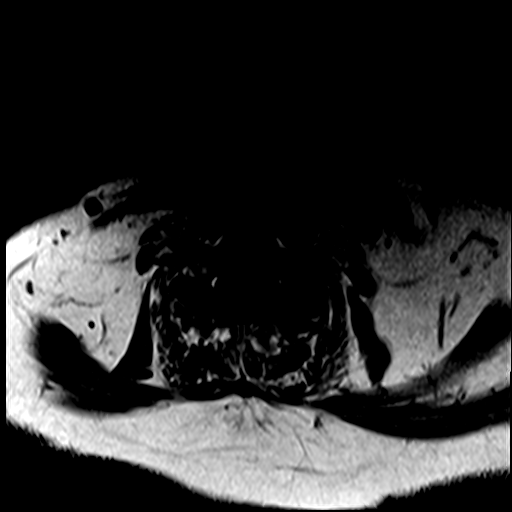
[im 17/29]
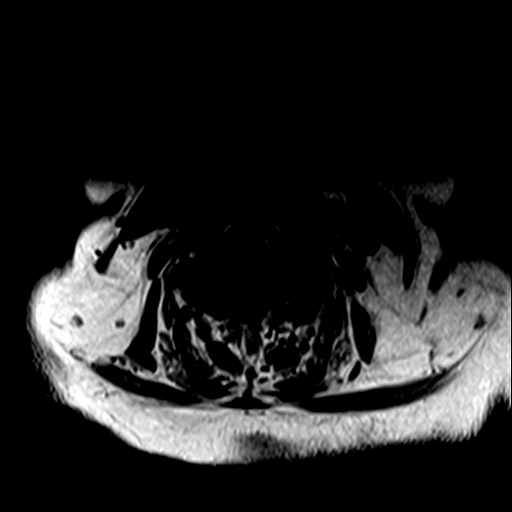
[im 21/29]
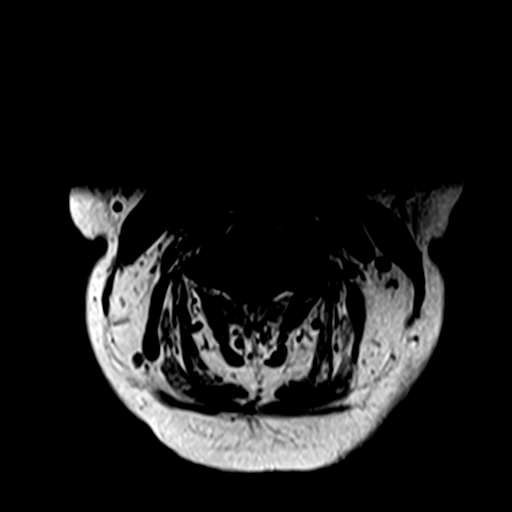
[im 25/29]
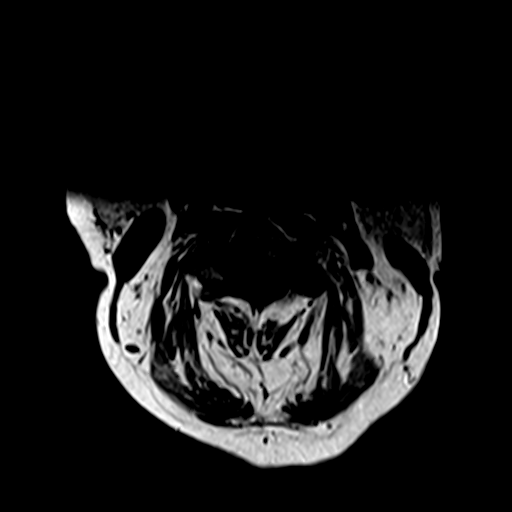
[im 29/29]
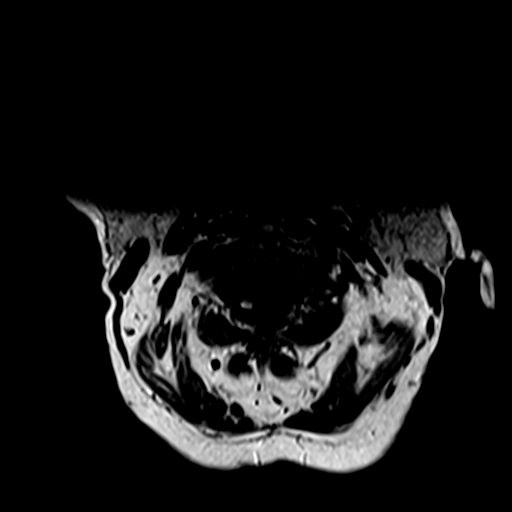

[Series 42: T1 post-contrast · axial · 3.0mm · 0.35mm/px · 1 of 29 slices shown]
[im 1/29]
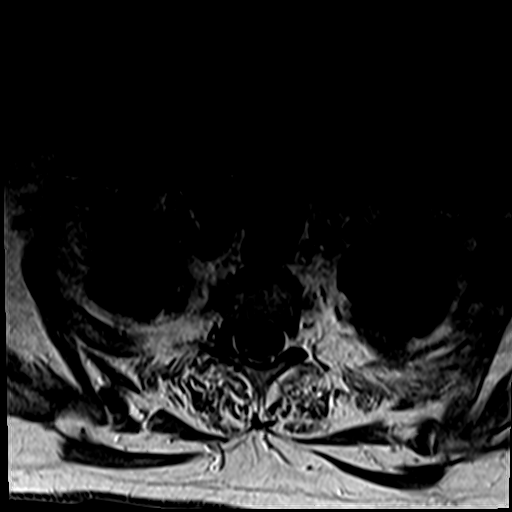

[25 of 48 positions shown; findings below may reference images not displayed]

FINDINGS: MRI CERVICAL SPINE FINDINGS

Alignment: Straightening of the normal cervical lordosis. No
listhesis.

Vertebrae: Vertebral body height maintained without acute or chronic
fracture. Bone marrow signal intensity within normal limits. Small
benign hemangioma noted within the C5 vertebral body. No worrisome
osseous lesions. No evidence for osteomyelitis discitis or septic
arthritis.

Cord: Normal signal and morphology. No epidural abscess or other
collection. No abnormal enhancement.

Posterior Fossa, vertebral arteries, paraspinal tissues: Chronic
microvascular ischemic changes noted within the visualized
pons/brainstem. Partially empty sella noted. Craniocervical junction
within normal limits. Paraspinous and prevertebral soft tissues
normal. Normal flow voids seen within the vertebral arteries
bilaterally.

Disc levels:

C2-C3: Mild disc bulge with right-sided uncovertebral and facet
hypertrophy. No spinal stenosis. Moderate right C3 foraminal
narrowing.

C3-C4: Small central disc protrusion with resultant mild spinal
stenosis, but no cord impingement. Superimposed mild uncovertebral
and facet hypertrophy without significant foraminal stenosis.

C4-C5: Small central disc protrusion with resultant mild spinal
stenosis, but no cord impingement. Left-sided facet degeneration.
Foramina remain patent.

C5-C6: Central disc protrusion indents the ventral thecal sac.
Resultant mild spinal stenosis with minimal flattening of the
ventral cord. Left-sided uncovertebral and facet hypertrophy. No
significant foraminal encroachment.

C6-C7: Minimal disc bulge. No significant spinal stenosis. Foramina
remain patent.

C7-T1: Negative interspace. Left-sided facet hypertrophy. No
significant stenosis.

MRI THORACIC SPINE FINDINGS

Alignment: Physiologic with preservation of the normal thoracic
kyphosis. No listhesis.

Vertebrae: Vertebral body height maintained without acute or chronic
fracture. Bone marrow signal intensity mildly heterogeneous but
overall within normal limits. No worrisome osseous lesions. No
signal changes to suggest osteomyelitis discitis or septic
arthritis.

Cord: Normal signal and morphology. No epidural abscess or other
collection. No abnormal enhancement.

Paraspinal and other soft tissues: Paraspinous soft tissues
demonstrate no acute finding. Small layering bilateral pleural
effusions, left slightly larger than right. Small benign appearing
simple cyst partially visualized at the upper pole the left kidney.
Visualized visceral structures otherwise unremarkable.

Disc levels:

T6-7: Central disc osteophyte complex indents the ventral thecal sac
(series 22, image 20). Secondary mild flattening of the ventral cord
without cord signal changes. Thecal sac fairly capacious at this
level without significant spinal stenosis. Foramina remain patent.

T7-8: Lobulated central disc protrusion indents the ventral thecal
sac, slightly asymmetric to the right. Flattening of the ventral
cord, worse on the right. No cord signal changes or significant
spinal stenosis. Foramina remain patent.

Otherwise, normal for age multilevel degenerative disc disease
without significant canal or foraminal stenosis. No other
significant focal disc herniation.

MRI LUMBAR SPINE FINDINGS

Segmentation: Standard. Lowest well-formed disc space labeled the
L5-S1 level.

Alignment: Trace retrolisthesis of L2 on L3, with 2 mm
anterolisthesis of L4 on L5 and L5 on S1, chronic and facet
mediated. Alignment otherwise normal preservation of the normal
lumbar lordosis.

Vertebrae: Vertebral body height maintained without acute or chronic
fracture. Bone marrow signal intensity mildly heterogeneous but
overall within normal limits. No worrisome osseous lesions. No
findings to suggest osteomyelitis discitis. Mild reactive endplate
change about the T11-12 and T12-L1 interspace anteriorly felt to be
consistent with degenerative change. No findings to suggest septic
arthritis.

Conus medullaris and cauda equina: Conus extends to the L1 level.
Conus and cauda equina appear normal. No epidural abscess or other
collection. No abnormal enhancement.

Paraspinal and other soft tissues: Minimal edema and enhancement
within the lower posterior paraspinous musculature, nonspecific, but
could reflect changes of muscular injury and/or strain. Possible
acute myositis could also have this appearance. No discrete
collections. Paraspinous soft tissues demonstrate no other acute
finding. 1 cm simple cyst present at the upper pole the left kidney.
Visualized visceral structures otherwise unremarkable.

Disc levels:

L1-2:  Unremarkable.

L2-3: Trace retrolisthesis. Minimal disc bulge with facet
hypertrophy. No stenosis.

L3-4: Minimal disc bulge. Mild bilateral facet hypertrophy. No
significant canal or foraminal stenosis.

L4-5: 2 mm anterolisthesis. Diffuse disc bulge with disc
desiccation. Moderate bilateral facet hypertrophy. Associated small
bilateral joint effusions. Resultant mild-to-moderate canal with
bilateral lateral recess stenosis. Mild left L4 foraminal narrowing.

L5-S1: Trace anterolisthesis. Disc desiccation with minimal disc
bulge. Moderate right worse than left facet hypertrophy with
associated small joint effusions. No canal or lateral recess
stenosis. Foramina remain patent.
IMPRESSION: 1. No MRI evidence for osteomyelitis discitis or septic arthritis
within the cervical, thoracic, or lumbar spine. No epidural abscess
or other collection.
2. Mild edema and enhancement within the lower posterior paraspinous
musculature, nonspecific, but could reflect changes of mild muscular
injury and/or strain. Possible acute myositis could also have this
appearance. No discrete collections.
3. Mild multilevel cervical spondylosis with resultant mild spinal
stenosis at C3-4 through C5-6.
4. Degenerative spondylosis at T6-7 and T7-8 with associated cord
flattening, but no significant spinal stenosis.
5. Mild for age multilevel lumbar spondylosis, most pronounced at
L4-5 where there is resultant mild-to-moderate bilateral lateral
recess stenosis. No frank impingement.
6. Small layering bilateral pleural effusions, left greater than
right.

## 2020-10-04 IMAGING — MR MR LUMBAR SPINE WO/W CM
5 of 7 series · 29 of 48 positions shown · IV contrast (gadavist)
Comparison: None available.

CLINICAL DATA: Initial evaluation for epidural abscess. Klebsiella
bacteremia.

EXAM:
MRI CERVICAL, THORACIC AND LUMBAR SPINE WITHOUT AND WITH CONTRAST
TECHNIQUE: Multiplanar and multiecho pulse sequences of the cervical spine, to
include the craniocervical junction and cervicothoracic junction,
and thoracic and lumbar spine, were obtained without and with
intravenous contrast.
CONTRAST:  5mL GADAVIST GADOBUTROL 1 MMOL/ML IV SOLN

[Series 31: T2 · sagittal · 4.0mm · 1.02mm/px · 5 of 15 slices shown (1 of 2)]
[im 1/15]
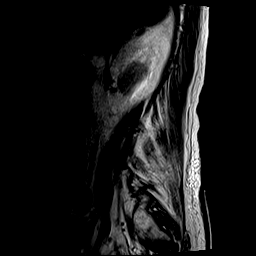
[im 4/15]
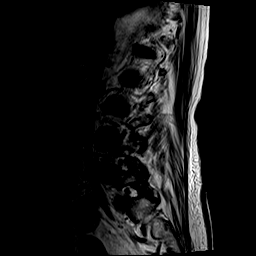
[im 8/15]
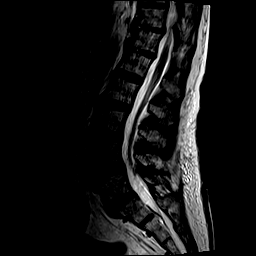
[im 11/15]
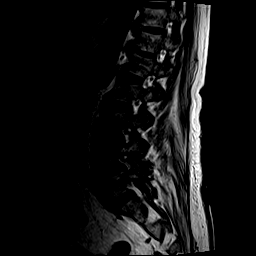
[im 15/15]
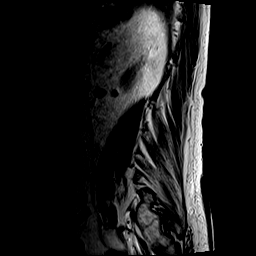

[Series 32: T1 · sagittal · 4.0mm · 1.02mm/px · 4 of 15 slices shown (1 of 2)]
[im 1/15]
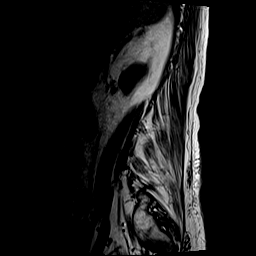
[im 5/15]
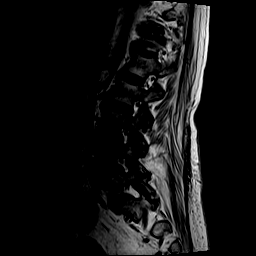
[im 10/15]
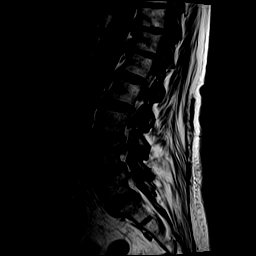
[im 15/15]
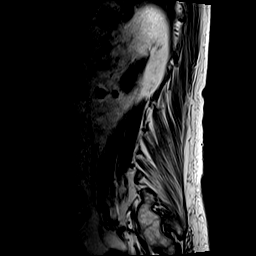

[Series 35: T2 · axial · 4.0mm · 0.78mm/px · z∈[-491,-271]mm · 8 of 36 slices shown (2 of 2)]
[im 1/36]
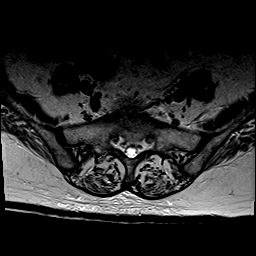
[im 4/36]
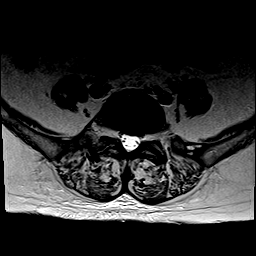
[im 12/36]
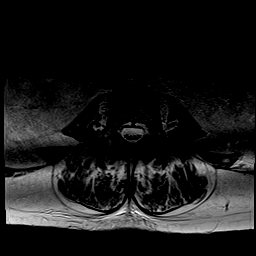
[im 16/36]
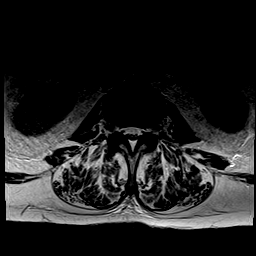
[im 20/36]
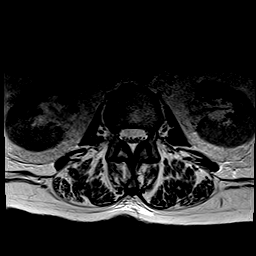
[im 24/36]
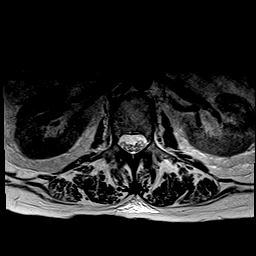
[im 32/36]
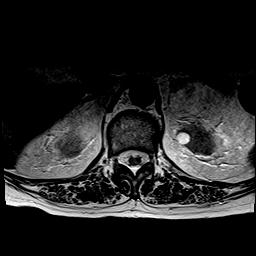
[im 36/36]
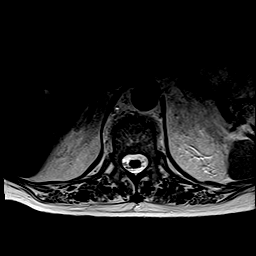

[Series 36: T1 · axial · 4.0mm · 0.39mm/px · z∈[-491,-271]mm · 8 of 36 slices shown (2 of 2)]
[im 1/36]
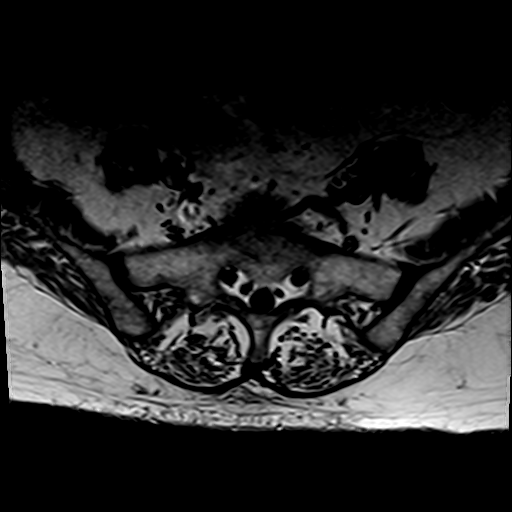
[im 4/36]
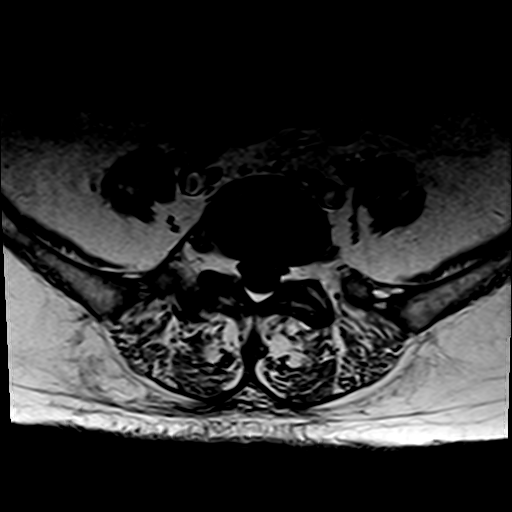
[im 12/36]
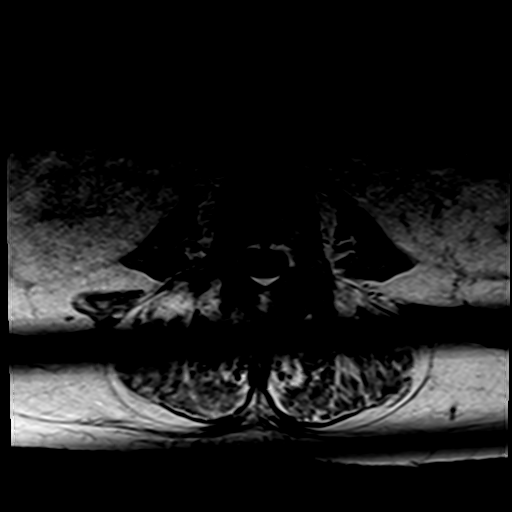
[im 16/36]
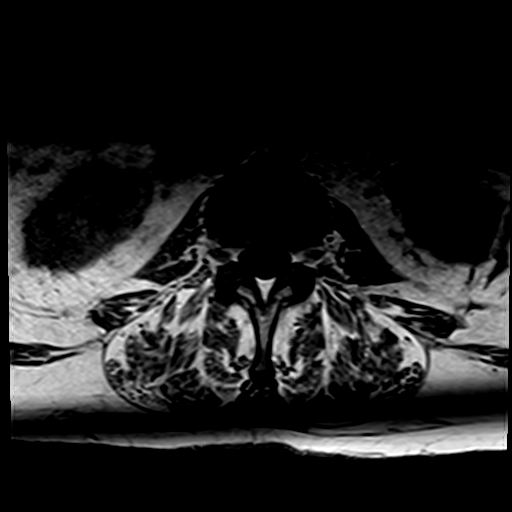
[im 20/36]
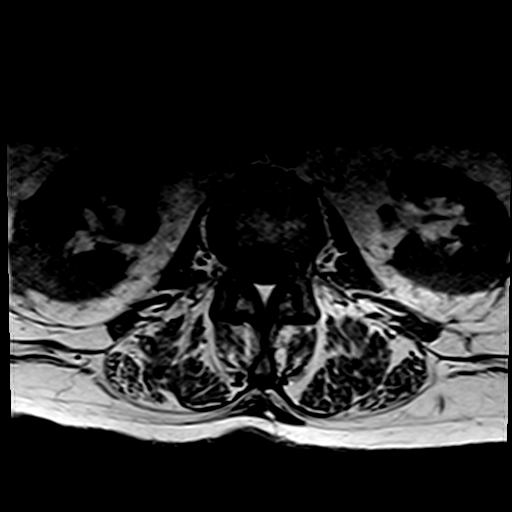
[im 24/36]
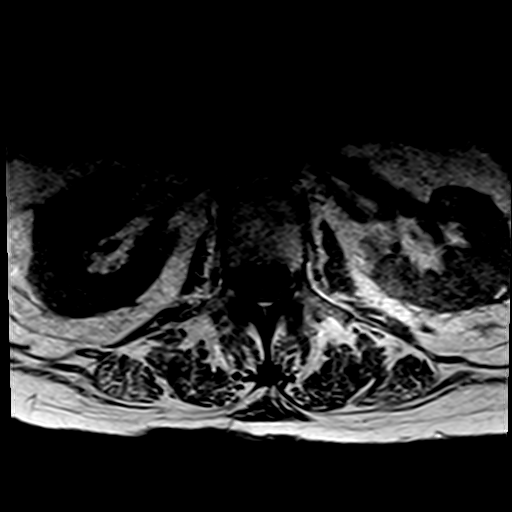
[im 32/36]
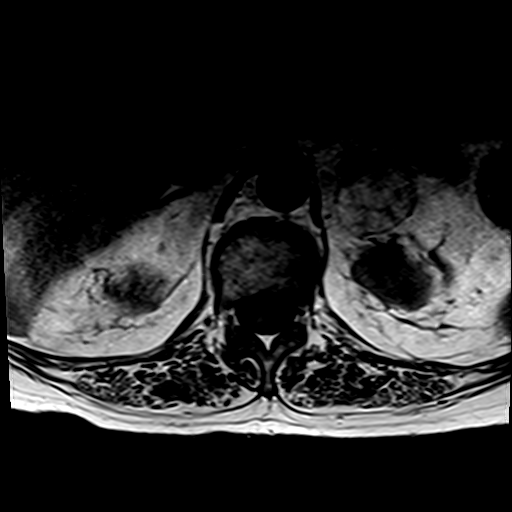
[im 36/36]
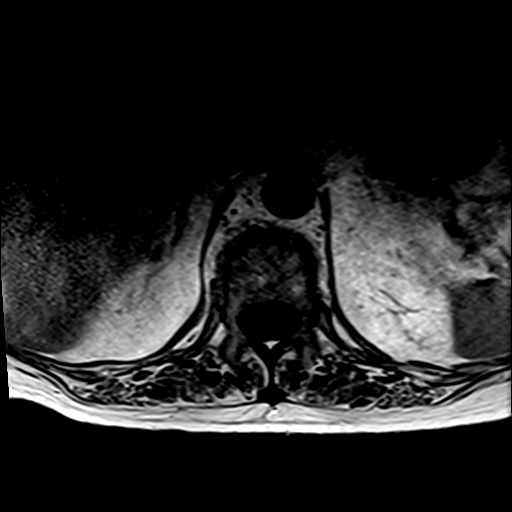

[Series 37: T1 fat-sat post-contrast · sagittal · 4.0mm · 0.81mm/px · 4 of 17 slices shown]
[im 1/17]
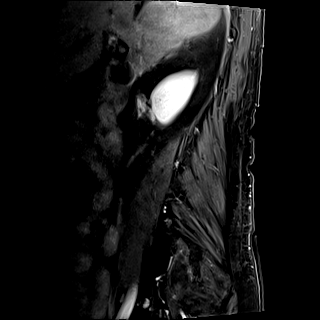
[im 5/17]
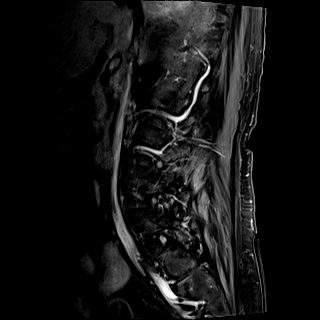
[im 9/17]
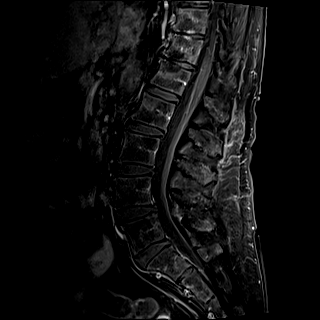
[im 13/17]
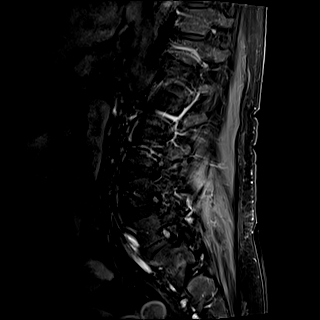

[29 of 48 positions shown; findings below may reference images not displayed]

FINDINGS: MRI CERVICAL SPINE FINDINGS

Alignment: Straightening of the normal cervical lordosis. No
listhesis.

Vertebrae: Vertebral body height maintained without acute or chronic
fracture. Bone marrow signal intensity within normal limits. Small
benign hemangioma noted within the C5 vertebral body. No worrisome
osseous lesions. No evidence for osteomyelitis discitis or septic
arthritis.

Cord: Normal signal and morphology. No epidural abscess or other
collection. No abnormal enhancement.

Posterior Fossa, vertebral arteries, paraspinal tissues: Chronic
microvascular ischemic changes noted within the visualized
pons/brainstem. Partially empty sella noted. Craniocervical junction
within normal limits. Paraspinous and prevertebral soft tissues
normal. Normal flow voids seen within the vertebral arteries
bilaterally.

Disc levels:

C2-C3: Mild disc bulge with right-sided uncovertebral and facet
hypertrophy. No spinal stenosis. Moderate right C3 foraminal
narrowing.

C3-C4: Small central disc protrusion with resultant mild spinal
stenosis, but no cord impingement. Superimposed mild uncovertebral
and facet hypertrophy without significant foraminal stenosis.

C4-C5: Small central disc protrusion with resultant mild spinal
stenosis, but no cord impingement. Left-sided facet degeneration.
Foramina remain patent.

C5-C6: Central disc protrusion indents the ventral thecal sac.
Resultant mild spinal stenosis with minimal flattening of the
ventral cord. Left-sided uncovertebral and facet hypertrophy. No
significant foraminal encroachment.

C6-C7: Minimal disc bulge. No significant spinal stenosis. Foramina
remain patent.

C7-T1: Negative interspace. Left-sided facet hypertrophy. No
significant stenosis.

MRI THORACIC SPINE FINDINGS

Alignment: Physiologic with preservation of the normal thoracic
kyphosis. No listhesis.

Vertebrae: Vertebral body height maintained without acute or chronic
fracture. Bone marrow signal intensity mildly heterogeneous but
overall within normal limits. No worrisome osseous lesions. No
signal changes to suggest osteomyelitis discitis or septic
arthritis.

Cord: Normal signal and morphology. No epidural abscess or other
collection. No abnormal enhancement.

Paraspinal and other soft tissues: Paraspinous soft tissues
demonstrate no acute finding. Small layering bilateral pleural
effusions, left slightly larger than right. Small benign appearing
simple cyst partially visualized at the upper pole the left kidney.
Visualized visceral structures otherwise unremarkable.

Disc levels:

T6-7: Central disc osteophyte complex indents the ventral thecal sac
(series 22, image 20). Secondary mild flattening of the ventral cord
without cord signal changes. Thecal sac fairly capacious at this
level without significant spinal stenosis. Foramina remain patent.

T7-8: Lobulated central disc protrusion indents the ventral thecal
sac, slightly asymmetric to the right. Flattening of the ventral
cord, worse on the right. No cord signal changes or significant
spinal stenosis. Foramina remain patent.

Otherwise, normal for age multilevel degenerative disc disease
without significant canal or foraminal stenosis. No other
significant focal disc herniation.

MRI LUMBAR SPINE FINDINGS

Segmentation: Standard. Lowest well-formed disc space labeled the
L5-S1 level.

Alignment: Trace retrolisthesis of L2 on L3, with 2 mm
anterolisthesis of L4 on L5 and L5 on S1, chronic and facet
mediated. Alignment otherwise normal preservation of the normal
lumbar lordosis.

Vertebrae: Vertebral body height maintained without acute or chronic
fracture. Bone marrow signal intensity mildly heterogeneous but
overall within normal limits. No worrisome osseous lesions. No
findings to suggest osteomyelitis discitis. Mild reactive endplate
change about the T11-12 and T12-L1 interspace anteriorly felt to be
consistent with degenerative change. No findings to suggest septic
arthritis.

Conus medullaris and cauda equina: Conus extends to the L1 level.
Conus and cauda equina appear normal. No epidural abscess or other
collection. No abnormal enhancement.

Paraspinal and other soft tissues: Minimal edema and enhancement
within the lower posterior paraspinous musculature, nonspecific, but
could reflect changes of muscular injury and/or strain. Possible
acute myositis could also have this appearance. No discrete
collections. Paraspinous soft tissues demonstrate no other acute
finding. 1 cm simple cyst present at the upper pole the left kidney.
Visualized visceral structures otherwise unremarkable.

Disc levels:

L1-2:  Unremarkable.

L2-3: Trace retrolisthesis. Minimal disc bulge with facet
hypertrophy. No stenosis.

L3-4: Minimal disc bulge. Mild bilateral facet hypertrophy. No
significant canal or foraminal stenosis.

L4-5: 2 mm anterolisthesis. Diffuse disc bulge with disc
desiccation. Moderate bilateral facet hypertrophy. Associated small
bilateral joint effusions. Resultant mild-to-moderate canal with
bilateral lateral recess stenosis. Mild left L4 foraminal narrowing.

L5-S1: Trace anterolisthesis. Disc desiccation with minimal disc
bulge. Moderate right worse than left facet hypertrophy with
associated small joint effusions. No canal or lateral recess
stenosis. Foramina remain patent.
IMPRESSION: 1. No MRI evidence for osteomyelitis discitis or septic arthritis
within the cervical, thoracic, or lumbar spine. No epidural abscess
or other collection.
2. Mild edema and enhancement within the lower posterior paraspinous
musculature, nonspecific, but could reflect changes of mild muscular
injury and/or strain. Possible acute myositis could also have this
appearance. No discrete collections.
3. Mild multilevel cervical spondylosis with resultant mild spinal
stenosis at C3-4 through C5-6.
4. Degenerative spondylosis at T6-7 and T7-8 with associated cord
flattening, but no significant spinal stenosis.
5. Mild for age multilevel lumbar spondylosis, most pronounced at
L4-5 where there is resultant mild-to-moderate bilateral lateral
recess stenosis. No frank impingement.
6. Small layering bilateral pleural effusions, left greater than
right.

## 2020-10-04 IMAGING — MR MR CERVICAL SPINE WO/W CM
1 series · 15 of 15 positions shown · IV contrast (gadavist)
Comparison: None available.

CLINICAL DATA: Initial evaluation for epidural abscess. Klebsiella
bacteremia.

EXAM:
MRI CERVICAL, THORACIC AND LUMBAR SPINE WITHOUT AND WITH CONTRAST
TECHNIQUE: Multiplanar and multiecho pulse sequences of the cervical spine, to
include the craniocervical junction and cervicothoracic junction,
and thoracic and lumbar spine, were obtained without and with
intravenous contrast.
CONTRAST:  5mL GADAVIST GADOBUTROL 1 MMOL/ML IV SOLN

[Series 33: STIR · sagittal · 3.0mm · 0.62mm/px · 15 of 15 slices shown]
[im 1/15]
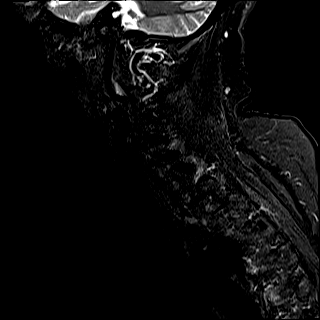
[im 2/15]
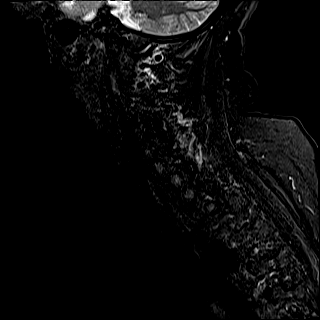
[im 3/15]
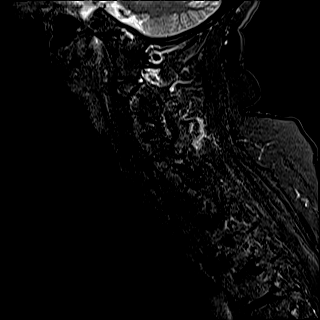
[im 4/15]
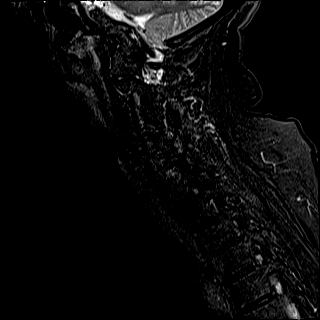
[im 5/15]
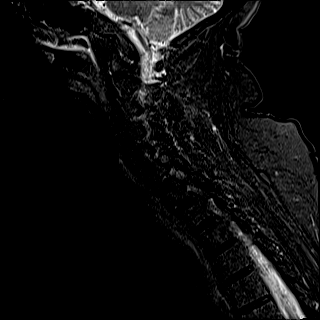
[im 6/15]
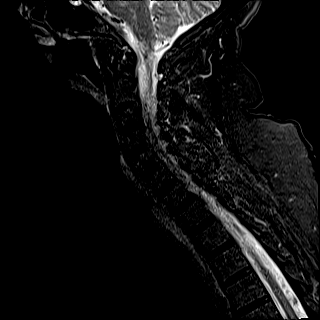
[im 7/15]
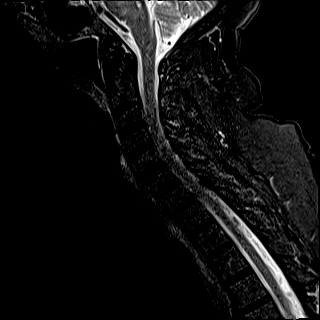
[im 8/15]
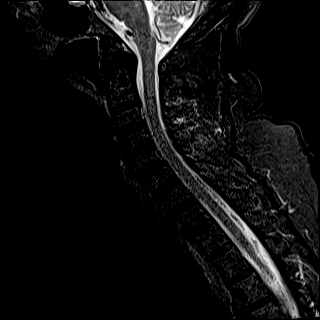
[im 9/15]
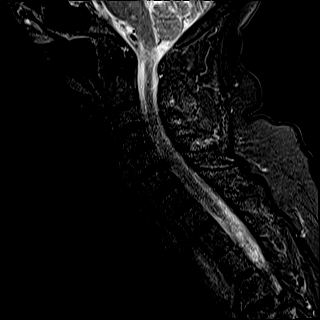
[im 10/15]
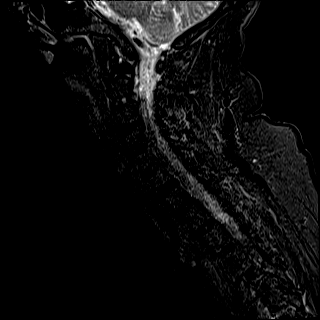
[im 11/15]
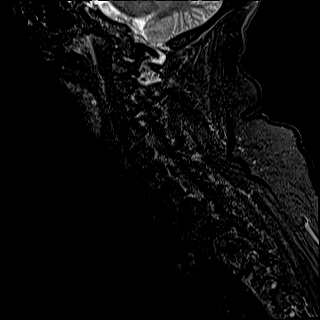
[im 12/15]
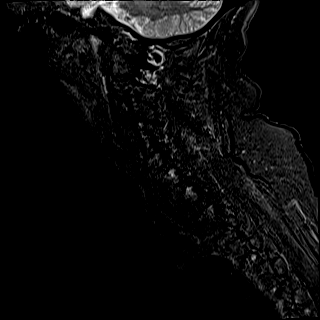
[im 13/15]
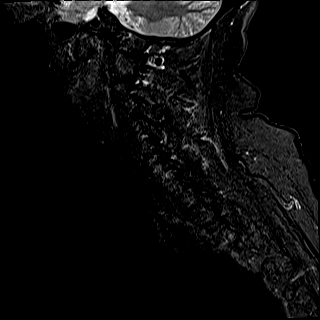
[im 14/15]
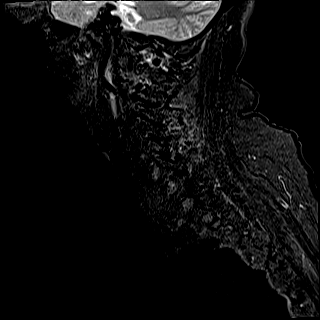
[im 15/15]
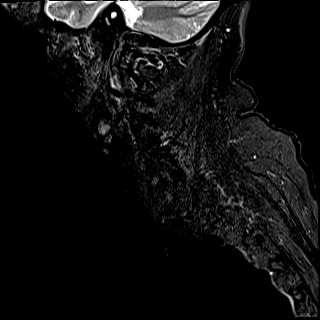

[15 of 15 positions shown; findings below may reference images not displayed]

FINDINGS: MRI CERVICAL SPINE FINDINGS

Alignment: Straightening of the normal cervical lordosis. No
listhesis.

Vertebrae: Vertebral body height maintained without acute or chronic
fracture. Bone marrow signal intensity within normal limits. Small
benign hemangioma noted within the C5 vertebral body. No worrisome
osseous lesions. No evidence for osteomyelitis discitis or septic
arthritis.

Cord: Normal signal and morphology. No epidural abscess or other
collection. No abnormal enhancement.

Posterior Fossa, vertebral arteries, paraspinal tissues: Chronic
microvascular ischemic changes noted within the visualized
pons/brainstem. Partially empty sella noted. Craniocervical junction
within normal limits. Paraspinous and prevertebral soft tissues
normal. Normal flow voids seen within the vertebral arteries
bilaterally.

Disc levels:

C2-C3: Mild disc bulge with right-sided uncovertebral and facet
hypertrophy. No spinal stenosis. Moderate right C3 foraminal
narrowing.

C3-C4: Small central disc protrusion with resultant mild spinal
stenosis, but no cord impingement. Superimposed mild uncovertebral
and facet hypertrophy without significant foraminal stenosis.

C4-C5: Small central disc protrusion with resultant mild spinal
stenosis, but no cord impingement. Left-sided facet degeneration.
Foramina remain patent.

C5-C6: Central disc protrusion indents the ventral thecal sac.
Resultant mild spinal stenosis with minimal flattening of the
ventral cord. Left-sided uncovertebral and facet hypertrophy. No
significant foraminal encroachment.

C6-C7: Minimal disc bulge. No significant spinal stenosis. Foramina
remain patent.

C7-T1: Negative interspace. Left-sided facet hypertrophy. No
significant stenosis.

MRI THORACIC SPINE FINDINGS

Alignment: Physiologic with preservation of the normal thoracic
kyphosis. No listhesis.

Vertebrae: Vertebral body height maintained without acute or chronic
fracture. Bone marrow signal intensity mildly heterogeneous but
overall within normal limits. No worrisome osseous lesions. No
signal changes to suggest osteomyelitis discitis or septic
arthritis.

Cord: Normal signal and morphology. No epidural abscess or other
collection. No abnormal enhancement.

Paraspinal and other soft tissues: Paraspinous soft tissues
demonstrate no acute finding. Small layering bilateral pleural
effusions, left slightly larger than right. Small benign appearing
simple cyst partially visualized at the upper pole the left kidney.
Visualized visceral structures otherwise unremarkable.

Disc levels:

T6-7: Central disc osteophyte complex indents the ventral thecal sac
(series 22, image 20). Secondary mild flattening of the ventral cord
without cord signal changes. Thecal sac fairly capacious at this
level without significant spinal stenosis. Foramina remain patent.

T7-8: Lobulated central disc protrusion indents the ventral thecal
sac, slightly asymmetric to the right. Flattening of the ventral
cord, worse on the right. No cord signal changes or significant
spinal stenosis. Foramina remain patent.

Otherwise, normal for age multilevel degenerative disc disease
without significant canal or foraminal stenosis. No other
significant focal disc herniation.

MRI LUMBAR SPINE FINDINGS

Segmentation: Standard. Lowest well-formed disc space labeled the
L5-S1 level.

Alignment: Trace retrolisthesis of L2 on L3, with 2 mm
anterolisthesis of L4 on L5 and L5 on S1, chronic and facet
mediated. Alignment otherwise normal preservation of the normal
lumbar lordosis.

Vertebrae: Vertebral body height maintained without acute or chronic
fracture. Bone marrow signal intensity mildly heterogeneous but
overall within normal limits. No worrisome osseous lesions. No
findings to suggest osteomyelitis discitis. Mild reactive endplate
change about the T11-12 and T12-L1 interspace anteriorly felt to be
consistent with degenerative change. No findings to suggest septic
arthritis.

Conus medullaris and cauda equina: Conus extends to the L1 level.
Conus and cauda equina appear normal. No epidural abscess or other
collection. No abnormal enhancement.

Paraspinal and other soft tissues: Minimal edema and enhancement
within the lower posterior paraspinous musculature, nonspecific, but
could reflect changes of muscular injury and/or strain. Possible
acute myositis could also have this appearance. No discrete
collections. Paraspinous soft tissues demonstrate no other acute
finding. 1 cm simple cyst present at the upper pole the left kidney.
Visualized visceral structures otherwise unremarkable.

Disc levels:

L1-2:  Unremarkable.

L2-3: Trace retrolisthesis. Minimal disc bulge with facet
hypertrophy. No stenosis.

L3-4: Minimal disc bulge. Mild bilateral facet hypertrophy. No
significant canal or foraminal stenosis.

L4-5: 2 mm anterolisthesis. Diffuse disc bulge with disc
desiccation. Moderate bilateral facet hypertrophy. Associated small
bilateral joint effusions. Resultant mild-to-moderate canal with
bilateral lateral recess stenosis. Mild left L4 foraminal narrowing.

L5-S1: Trace anterolisthesis. Disc desiccation with minimal disc
bulge. Moderate right worse than left facet hypertrophy with
associated small joint effusions. No canal or lateral recess
stenosis. Foramina remain patent.
IMPRESSION: 1. No MRI evidence for osteomyelitis discitis or septic arthritis
within the cervical, thoracic, or lumbar spine. No epidural abscess
or other collection.
2. Mild edema and enhancement within the lower posterior paraspinous
musculature, nonspecific, but could reflect changes of mild muscular
injury and/or strain. Possible acute myositis could also have this
appearance. No discrete collections.
3. Mild multilevel cervical spondylosis with resultant mild spinal
stenosis at C3-4 through C5-6.
4. Degenerative spondylosis at T6-7 and T7-8 with associated cord
flattening, but no significant spinal stenosis.
5. Mild for age multilevel lumbar spondylosis, most pronounced at
L4-5 where there is resultant mild-to-moderate bilateral lateral
recess stenosis. No frank impingement.
6. Small layering bilateral pleural effusions, left greater than
right.

## 2020-10-04 IMAGING — MR MR THORACIC SPINE WO/W CM
6 of 8 series · 28 of 48 positions shown · IV contrast (gadavist)
Comparison: None available.

CLINICAL DATA: Initial evaluation for epidural abscess. Klebsiella
bacteremia.

EXAM:
MRI CERVICAL, THORACIC AND LUMBAR SPINE WITHOUT AND WITH CONTRAST
TECHNIQUE: Multiplanar and multiecho pulse sequences of the cervical spine, to
include the craniocervical junction and cervicothoracic junction,
and thoracic and lumbar spine, were obtained without and with
intravenous contrast.
CONTRAST:  5mL GADAVIST GADOBUTROL 1 MMOL/ML IV SOLN

[Series 19: T2 · sagittal · 3.0mm · 1.33mm/px · 4 of 17 slices shown (1 of 2)]
[im 1/17]
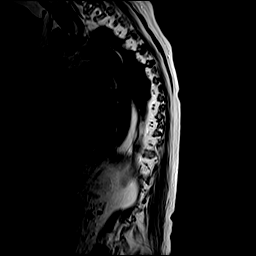
[im 6/17]
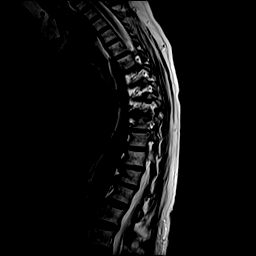
[im 11/17]
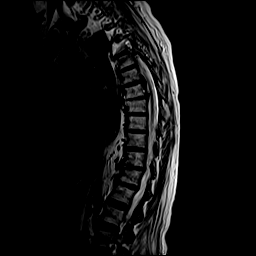
[im 17/17]
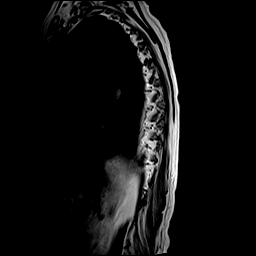

[Series 20: T1 · sagittal · 3.0mm · 1.33mm/px · 4 of 17 slices shown]
[im 1/17]
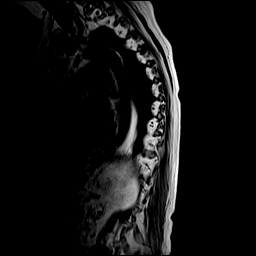
[im 6/17]
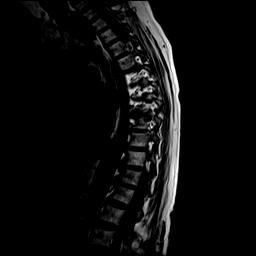
[im 11/17]
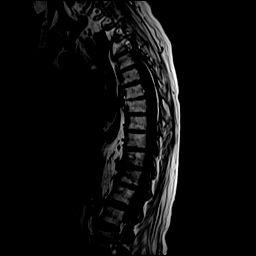
[im 17/17]
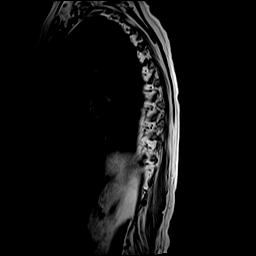

[Series 21: STIR · sagittal · 3.0mm · 0.66mm/px · 4 of 17 slices shown]
[im 1/17]
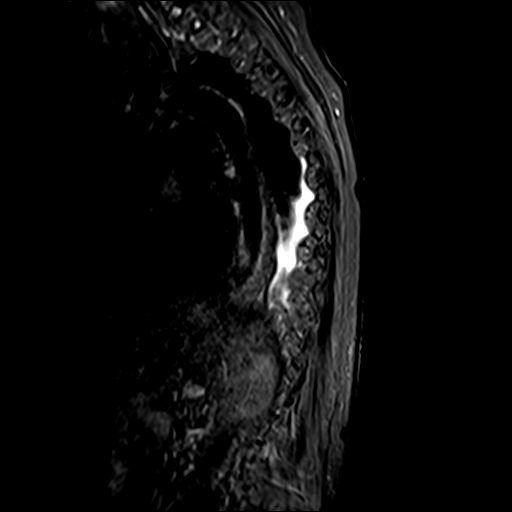
[im 6/17]
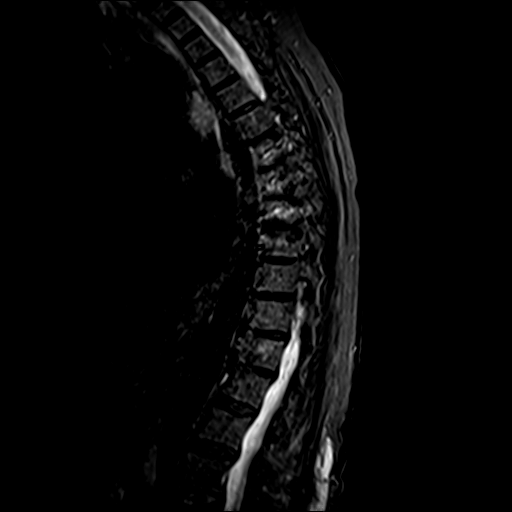
[im 11/17]
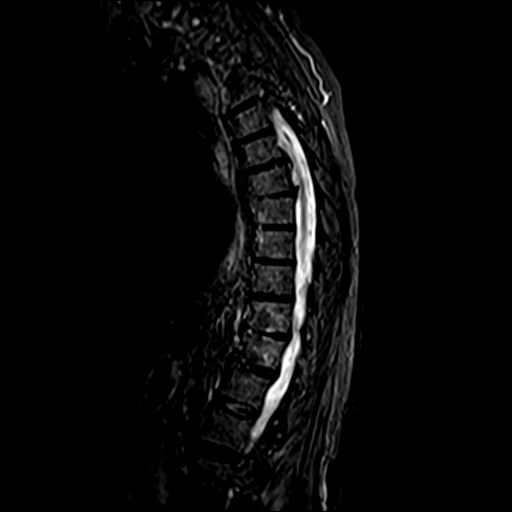
[im 17/17]
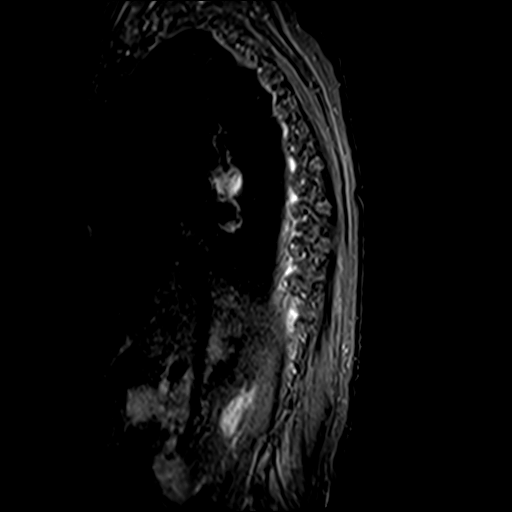

[Series 22: T2 · axial · 4.0mm · 0.59mm/px · z∈[-279,-114]mm · 8 of 39 slices shown (2 of 2)]
[im 1/39]
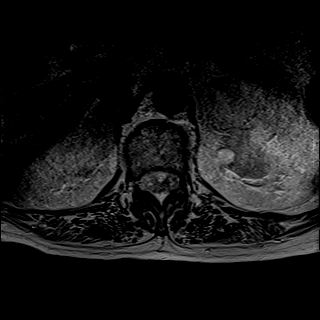
[im 6/39]
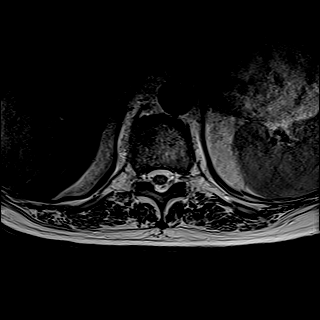
[im 11/39]
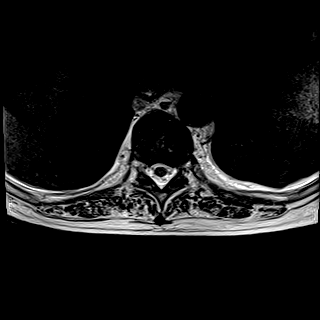
[im 17/39]
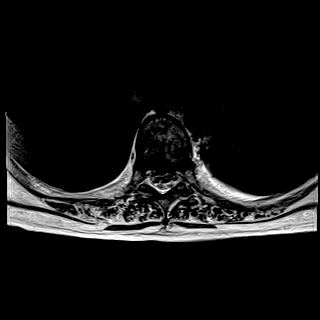
[im 22/39]
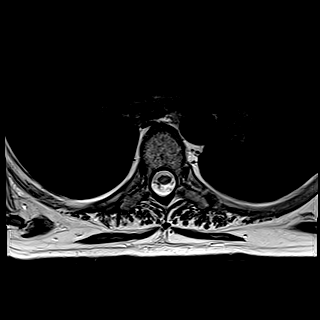
[im 28/39]
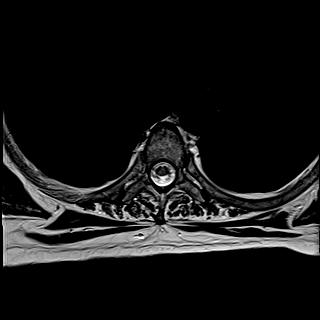
[im 33/39]
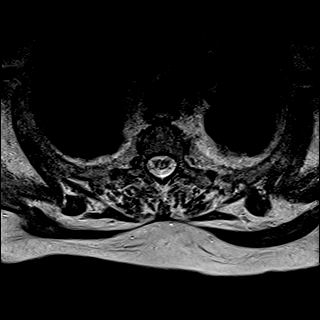
[im 39/39]
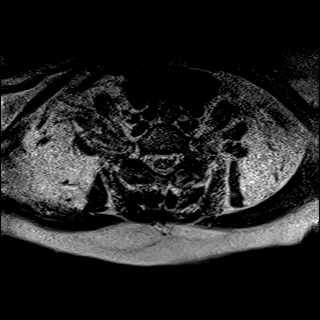

[Series 24: T1 post-contrast · axial · non-contrast · 4.0mm · 0.37mm/px · z∈[-279,-193]mm · 4 of 39 slices shown]
[im 1/39]
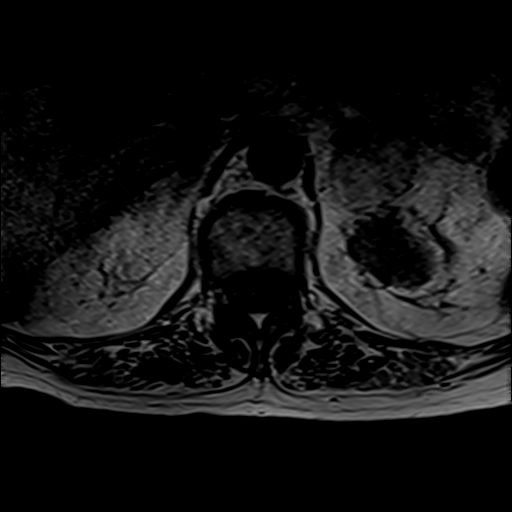
[im 6/39]
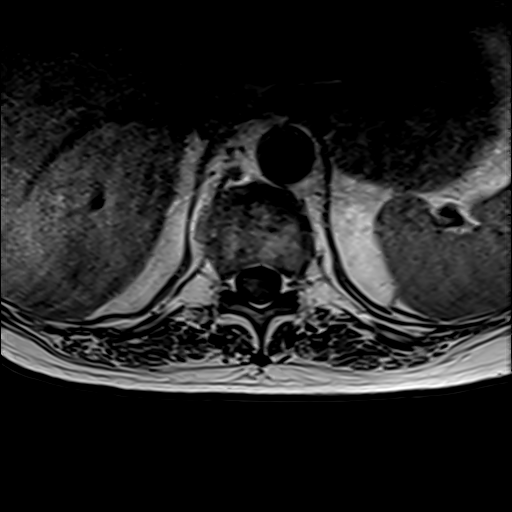
[im 11/39]
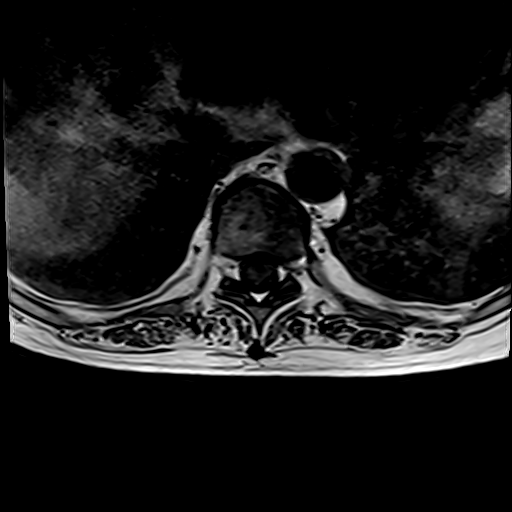
[im 17/39]
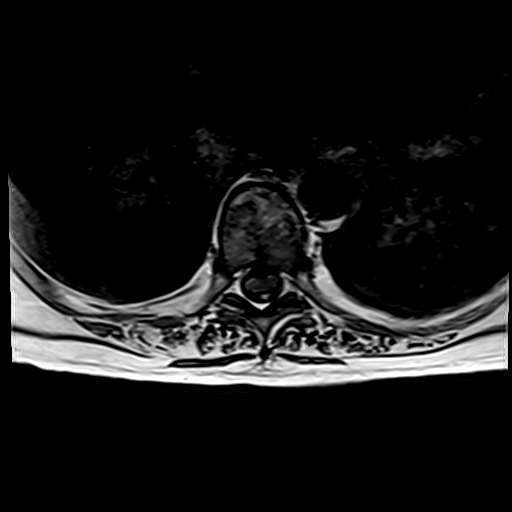

[Series 40: T1 fat-sat post-contrast · sagittal · 3.0mm · 1.33mm/px · 4 of 17 slices shown]
[im 1/17]
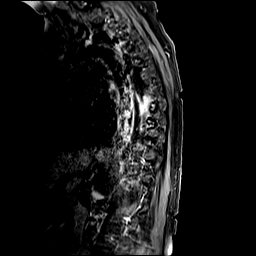
[im 6/17]
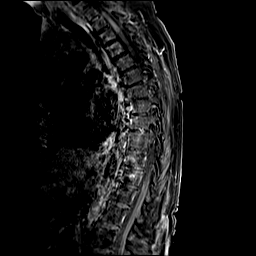
[im 11/17]
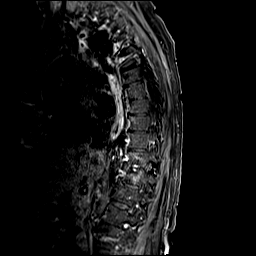
[im 17/17]
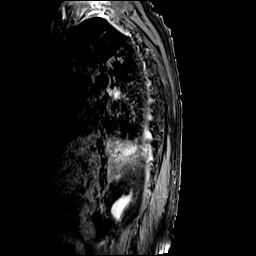

[28 of 48 positions shown; findings below may reference images not displayed]

FINDINGS: MRI CERVICAL SPINE FINDINGS

Alignment: Straightening of the normal cervical lordosis. No
listhesis.

Vertebrae: Vertebral body height maintained without acute or chronic
fracture. Bone marrow signal intensity within normal limits. Small
benign hemangioma noted within the C5 vertebral body. No worrisome
osseous lesions. No evidence for osteomyelitis discitis or septic
arthritis.

Cord: Normal signal and morphology. No epidural abscess or other
collection. No abnormal enhancement.

Posterior Fossa, vertebral arteries, paraspinal tissues: Chronic
microvascular ischemic changes noted within the visualized
pons/brainstem. Partially empty sella noted. Craniocervical junction
within normal limits. Paraspinous and prevertebral soft tissues
normal. Normal flow voids seen within the vertebral arteries
bilaterally.

Disc levels:

C2-C3: Mild disc bulge with right-sided uncovertebral and facet
hypertrophy. No spinal stenosis. Moderate right C3 foraminal
narrowing.

C3-C4: Small central disc protrusion with resultant mild spinal
stenosis, but no cord impingement. Superimposed mild uncovertebral
and facet hypertrophy without significant foraminal stenosis.

C4-C5: Small central disc protrusion with resultant mild spinal
stenosis, but no cord impingement. Left-sided facet degeneration.
Foramina remain patent.

C5-C6: Central disc protrusion indents the ventral thecal sac.
Resultant mild spinal stenosis with minimal flattening of the
ventral cord. Left-sided uncovertebral and facet hypertrophy. No
significant foraminal encroachment.

C6-C7: Minimal disc bulge. No significant spinal stenosis. Foramina
remain patent.

C7-T1: Negative interspace. Left-sided facet hypertrophy. No
significant stenosis.

MRI THORACIC SPINE FINDINGS

Alignment: Physiologic with preservation of the normal thoracic
kyphosis. No listhesis.

Vertebrae: Vertebral body height maintained without acute or chronic
fracture. Bone marrow signal intensity mildly heterogeneous but
overall within normal limits. No worrisome osseous lesions. No
signal changes to suggest osteomyelitis discitis or septic
arthritis.

Cord: Normal signal and morphology. No epidural abscess or other
collection. No abnormal enhancement.

Paraspinal and other soft tissues: Paraspinous soft tissues
demonstrate no acute finding. Small layering bilateral pleural
effusions, left slightly larger than right. Small benign appearing
simple cyst partially visualized at the upper pole the left kidney.
Visualized visceral structures otherwise unremarkable.

Disc levels:

T6-7: Central disc osteophyte complex indents the ventral thecal sac
(series 22, image 20). Secondary mild flattening of the ventral cord
without cord signal changes. Thecal sac fairly capacious at this
level without significant spinal stenosis. Foramina remain patent.

T7-8: Lobulated central disc protrusion indents the ventral thecal
sac, slightly asymmetric to the right. Flattening of the ventral
cord, worse on the right. No cord signal changes or significant
spinal stenosis. Foramina remain patent.

Otherwise, normal for age multilevel degenerative disc disease
without significant canal or foraminal stenosis. No other
significant focal disc herniation.

MRI LUMBAR SPINE FINDINGS

Segmentation: Standard. Lowest well-formed disc space labeled the
L5-S1 level.

Alignment: Trace retrolisthesis of L2 on L3, with 2 mm
anterolisthesis of L4 on L5 and L5 on S1, chronic and facet
mediated. Alignment otherwise normal preservation of the normal
lumbar lordosis.

Vertebrae: Vertebral body height maintained without acute or chronic
fracture. Bone marrow signal intensity mildly heterogeneous but
overall within normal limits. No worrisome osseous lesions. No
findings to suggest osteomyelitis discitis. Mild reactive endplate
change about the T11-12 and T12-L1 interspace anteriorly felt to be
consistent with degenerative change. No findings to suggest septic
arthritis.

Conus medullaris and cauda equina: Conus extends to the L1 level.
Conus and cauda equina appear normal. No epidural abscess or other
collection. No abnormal enhancement.

Paraspinal and other soft tissues: Minimal edema and enhancement
within the lower posterior paraspinous musculature, nonspecific, but
could reflect changes of muscular injury and/or strain. Possible
acute myositis could also have this appearance. No discrete
collections. Paraspinous soft tissues demonstrate no other acute
finding. 1 cm simple cyst present at the upper pole the left kidney.
Visualized visceral structures otherwise unremarkable.

Disc levels:

L1-2:  Unremarkable.

L2-3: Trace retrolisthesis. Minimal disc bulge with facet
hypertrophy. No stenosis.

L3-4: Minimal disc bulge. Mild bilateral facet hypertrophy. No
significant canal or foraminal stenosis.

L4-5: 2 mm anterolisthesis. Diffuse disc bulge with disc
desiccation. Moderate bilateral facet hypertrophy. Associated small
bilateral joint effusions. Resultant mild-to-moderate canal with
bilateral lateral recess stenosis. Mild left L4 foraminal narrowing.

L5-S1: Trace anterolisthesis. Disc desiccation with minimal disc
bulge. Moderate right worse than left facet hypertrophy with
associated small joint effusions. No canal or lateral recess
stenosis. Foramina remain patent.
IMPRESSION: 1. No MRI evidence for osteomyelitis discitis or septic arthritis
within the cervical, thoracic, or lumbar spine. No epidural abscess
or other collection.
2. Mild edema and enhancement within the lower posterior paraspinous
musculature, nonspecific, but could reflect changes of mild muscular
injury and/or strain. Possible acute myositis could also have this
appearance. No discrete collections.
3. Mild multilevel cervical spondylosis with resultant mild spinal
stenosis at C3-4 through C5-6.
4. Degenerative spondylosis at T6-7 and T7-8 with associated cord
flattening, but no significant spinal stenosis.
5. Mild for age multilevel lumbar spondylosis, most pronounced at
L4-5 where there is resultant mild-to-moderate bilateral lateral
recess stenosis. No frank impingement.
6. Small layering bilateral pleural effusions, left greater than
right.

## 2020-10-04 MED ORDER — MAGNESIUM SULFATE IN D5W 1-5 GM/100ML-% IV SOLN
2.0000 g | Freq: Once | INTRAVENOUS | Status: AC
  Administered 2020-10-04: 2 g via INTRAVENOUS
  Filled 2020-10-04: qty 200

## 2020-10-04 MED ORDER — GADOBUTROL 1 MMOL/ML IV SOLN
5.0000 mL | Freq: Once | INTRAVENOUS | Status: AC | PRN
  Administered 2020-10-04: 5 mL via INTRAVENOUS

## 2020-10-04 MED ORDER — SODIUM CHLORIDE 0.9 % IV SOLN
2.0000 g | INTRAVENOUS | Status: DC
  Administered 2020-10-04 – 2020-10-05 (×2): 2 g via INTRAVENOUS
  Filled 2020-10-04 (×2): qty 2

## 2020-10-04 MED ORDER — MAGNESIUM SULFATE 2 GM/50ML IV SOLN
2.0000 g | Freq: Once | INTRAVENOUS | Status: DC
  Filled 2020-10-04: qty 50

## 2020-10-04 MED ORDER — MIDODRINE HCL 5 MG PO TABS
10.0000 mg | ORAL_TABLET | Freq: Three times a day (TID) | ORAL | Status: DC
  Administered 2020-10-04 – 2020-10-09 (×15): 10 mg via ORAL
  Filled 2020-10-04 (×15): qty 2

## 2020-10-04 MED ORDER — POTASSIUM CHLORIDE CRYS ER 20 MEQ PO TBCR
40.0000 meq | EXTENDED_RELEASE_TABLET | Freq: Once | ORAL | Status: AC
  Administered 2020-10-04: 40 meq via ORAL
  Filled 2020-10-04: qty 2

## 2020-10-04 MED ORDER — MOMETASONE FURO-FORMOTEROL FUM 200-5 MCG/ACT IN AERO
2.0000 | INHALATION_SPRAY | Freq: Two times a day (BID) | RESPIRATORY_TRACT | Status: DC
  Administered 2020-10-04 – 2020-10-13 (×17): 2 via RESPIRATORY_TRACT
  Filled 2020-10-04: qty 8.8

## 2020-10-04 NOTE — Progress Notes (Signed)
NAME:  Kathryn Garrett, MRN:  [id number], DOB:  [birth year], LOS: 24 ADMISSION DATE:  09/10/2020, INITIAL CONSULTATION DATE:  09/11/2020 REFERRING MD: Dr. Damita Dunnings, CHIEF COMPLAINT: SOB  Brief Patient Description  Ms. Townshend is a 75F with CAD status post RCA PCI in 2017 and in-stent restenosis requiring PCI, VF arrest,, chronic systolic diastolic heart failure, COPD, ongoing tobacco abuse, hypertension, hyperlipidemia, COPD, and narcotic abuse admitted with COPD exacerbation and Klebsiella pneumonia as well as acute on chronic systolic and diastolic heart failure.  Hospital course was complicated by cardiogenic shock requiring milrinone for diuresis and hypoxic respiratory failure requiring intubation. She was treated with Cefepime/Ancef for aspiration. She underwent left and right heart cath on 5/13 and was found to have mild to moderate nonobstructive CAD and normal filling pressures. She also had SVT that was managed with metoprolol. Patient was extubated and transferred to the floor. On 5/21 she was noted to be hypotensive despite IVFs and in sinus tachycardia to the 150s.  Lacate was 2.4.  Blood cultures with GNRs.  WBC was 14.8.  No significant infection in urine. CXR was unremarkable.  She was treated with Cefepime for Klebsiella pneumonia and transferred to ICU.  SHe was started on levophed for septic shock and PCCM re-consulted to assist with management.  Pertinent  Medical History  CAD status post RCA PCI in 2017 and in-stent restenosis requiring PCI,  VF arrest,,  chronic systolic diastolic heart failure,  COPD,  ongoing tobacco abuse,  hypertension,  hyperlipidemia,  Chronic narcotic abuse    Significant Hospital Events: Including procedures, antibiotic start and stop dates in addition to other pertinent events   09/10/2020-patient admits admitted with hospitalist service for COPD exacerbation and left lower lobe pneumonia 09/11/2020-overnight patient had suspected flash pulmonary  edema, dyspnea followed by somnolence requiring emergent intubation and mechanical ventilation.  Admitted to ICU 09/12/20- patient failed SBT , she had blood tinged secretions fromETT and aggitation, family was at bedside and we agreed on giving her more time and try again.  09/14/20- Failed SBT (became hypoxic with sats mid 80's, increased WOB and assessory muscle use, HR 140-160's). Will diurese today and add Metoprolol.  Tracheal aspirate from 4/30 with KLEBSIELLA PNEUMONIAE (resistant to Ampicillin)   09/15/20- Failed SBT (increased WOB and assessory muscle use, RR 40's, HR 140-150's, Hypertensive); plan to add scheduled PO Klonopin and Oxycodone, Lasix 40 mg IV x1, consult Palliative Care 09/16/20- Worsening Leukocytosis up to 21.7, low grade fever overnight, increased secretions overnight, will repeat Tracheal aspirate, remove central line, add mucinex, plan for SBT 09/17/20-[name] passed SBT in AM and following commands still having some moderate secretions 09/18/20-  Extubated yesterday.  Failed swallow eval today.  09/20/20- Weaned to room air, Leukocytosis improving; Hemodynamically stable 09/27/20- patient is coughing up phlegm but clinically is improved, we discussed pneumonia and COPD with outpatient clinic follow up with pulmonology.  There is plan for post dc rehab 09/28/20- patient is improved and she is being optimized for CIR, pulmonary will sign off and can follow up on outpatient basis 09/29/20- family at bedside today.  We discussed barium swallow with aspiration.  09/30/20- patient had PT today she walked today without need of additional supplemental O2 therapy.  She generally has been walking ok but has had severe LE weakness here.  She ate lunch infront of me had cough after couple bites.  We disucssed aspiartion risk and her barium swallow.  10/01/20: PCCM signed off  10/02/20: Pt with hypotension despite aggressive fluid resuscitation  concerning for sepsis secondary to UTI requiring levophed  gtt and transfer to stepdown unit.  PCCM signed back on case to assist with management  10/03/20: Remains on Levophed 10/04/20: Weaning down Levophed, increase Midodrine to 10 mg TID; consult ID for Bacteremia   Cultures:  4/29: SARS-CoV-2 PCR>> negative 4/29:Influenza PCR>> negative 5/1: Strep pneumo urinary antigen>>negative 5/1: Legionella urinary antigen>>negative 5/5: Tracheal aspirate> candida albicans 5/21: Blood culture x2>>3 out 4 bottles growing GNR BCID positive for klebsiella pneumoniae ESBL not detected 5/21: UCx> pending  Antimicrobials:  Ceftriaxone 5/22>> Cefepime 5/21 x 1 dose Metronidazole 5/21 x 1 dose  Interim History / Subjective:  -No acute events reported overnight -Pt denies all complaints -Up in chair this morning eating breakfast -On  2L [place], weaning levophed (currently on 2 mcg), will increase Midodrine to 10 mg TID -Afebrile, urine output 1.8 L last 24 hrs (+1.5L since admit)  OBJECTIVE   Blood pressure (!) 112/58, pulse 91, temperature 99.7 F (37.6 C), temperature source Oral, resp. rate 17, height 4\' 11"  (1.499 m), weight 54.9 kg, SpO2 94 %.        Intake/Output Summary (Last 24 hours) at 10/04/2020 0913 Last data filed at 10/04/2020 0500 Gross per 24 hour  Intake 769.75 ml  Output 925 ml  Net -155.25 ml   Filed Weights   10/02/20 0334 10/03/20 0500 10/04/20 0437  Weight: 54.6 kg 54.6 kg 54.9 kg   Physical Examination: GENERAL:Acutely ill appearing 75 year-old patient, sitting in recliner, no acute distress.  EYES: Pupils equal, round, reactive to light and accommodation. No scleral icterus. Extraocular muscles intact.  HEENT: Head atraumatic, normocephalic. Oropharynx and nasopharynx clear.  NECK:  Supple, no jugular venous distention. No thyroid enlargement, no tenderness.  LUNGS: Clear breath sounds bilaterally, no wheezing, rales,rhonchi or crepitation. No use of accessory muscles of respiration.  CARDIOVASCULAR: Tachycardiac, regular  rhythm, S1, S2. No murmurs, rubs, or gallops. 2+ distal pulses ABDOMEN: Soft, nontender, nondistended. Bowel sounds present. No organomegaly or mass.  EXTREMITIES: No pedal edema, cyanosis, or clubbing.  NEUROLOGIC: Cranial nerves II through XII are intact. Muscle strength 5/5 in all extremities. Sensation intact. Gait not checked.  PSYCHIATRIC: The patient is alert and oriented x 4.  SKIN: Warm and dry. No obvious rash, lesion, or ulcer.   Labs/imaging that I havepersonally reviewed  (right click and "Reselect all SmartList Selections" daily)   Labs 10/04/2020: WBC 5.5, Hgb 10.4, glucose 123, BUN 8, Cr. 0.44 CXR 05/21: No active disease   Labs   CBC: Recent Labs  Lab 09/29/20 0442 09/30/20 0430 10/02/20 1823 10/03/20 0822 10/04/20 0407  WBC 12.2* 8.4 14.8* 8.6 5.5  NEUTROABS 7.3 4.6 12.9* 6.4 3.4  HGB 12.1 10.9* 12.2 11.6* 10.4*  HCT 38.2 34.8* 37.0 35.8* 32.5*  MCV 96.2 94.6 92.0 93.5 94.5  PLT 372 351 297 305 161    Basic Metabolic Panel: Recent Labs  Lab 09/30/20 0430 10/02/20 0508 10/02/20 1736 10/03/20 0822 10/04/20 0407  NA 137 138 134* 138 141  K 3.4* 3.3* 3.9 3.5 3.5  CL 106 104 99 105 108  CO2 24 27 26 25 26   GLUCOSE 109* 110* 142* 136* 123*  BUN 20 10 12 10 8   CREATININE 0.56 0.49 0.58 0.44 0.44  CALCIUM 8.5* 8.5* 8.5* 8.4* 8.4*  MG  --   --   --  1.7 1.8  PHOS  --   --   --  3.3 2.9   GFR: Estimated Creatinine Clearance: 45.9 mL/min (by C-G  formula based on SCr of 0.44 mg/dL). Recent Labs  Lab 09/30/20 0430 10/02/20 1736 10/02/20 1823 10/02/20 1824 10/02/20 2130 10/03/20 0822 10/04/20 0407  PROCALCITON  --  1.93  --   --   --   --   --   WBC 8.4  --  14.8*  --   --  8.6 5.5  LATICACIDVEN  --   --   --  2.4* 1.1  --   --     Liver Function Tests: Recent Labs  Lab 10/02/20 1736 10/03/20 0822  AST 27  --   ALT 28  --   ALKPHOS 77  --   BILITOT 0.6  --   PROT 6.4*  --   ALBUMIN 2.8* 2.5*   No results for input(s): LIPASE, AMYLASE in  the last 168 hours. No results for input(s): AMMONIA in the last 168 hours.  ABG    Component Value Date/Time   PHART 7.38 09/15/2020 0500   PCO2ART 41 09/15/2020 0500   PO2ART 123 (H) 09/15/2020 0500   HCO3 33.5 (H) 09/17/2020 0944   TCO2 23 01/14/2009 0327   ACIDBASEDEF 0.8 09/15/2020 0500   O2SAT 88.8 09/17/2020 0944     Coagulation Profile: Recent Labs  Lab 10/02/20 1736  INR 1.4*    Cardiac Enzymes: No results for input(s): CKTOTAL, CKMB, CKMBINDEX, TROPONINI in the last 168 hours.  HbA1C: Hgb A1c MFr Bld  Date/Time Value Ref Range Status  09/11/2020 04:37 AM 6.4 (H) 4.8 - 5.6 % Final    Comment:    (NOTE) Pre diabetes:          5.7%-6.4%  Diabetes:              >6.4%  Glycemic control for   <7.0% adults with diabetes   10/02/2019 12:28 PM 6.4 (H) 4.8 - 5.6 % Final    Comment:             Prediabetes: 5.7 - 6.4          Diabetes: >6.4          Glycemic control for adults with diabetes: <7.0     CBG: Recent Labs  Lab 10/03/20 1551 10/03/20 1944 10/04/20 0034 10/04/20 0413 10/04/20 0741  GLUCAP 122* 142* 112* 124* 126*    Allergies Allergies  Allergen Reactions  . Azithromycin Rash     Home Medications  Prior to Admission medications   Medication Sig Start Date End Date Taking? Authorizing Provider  acetaminophen (TYLENOL) 500 MG tablet Take 1,000 mg by mouth every 6 (six) hours as needed for headache.   Yes [provider]  albuterol (PROVENTIL HFA;VENTOLIN HFA) 108 (90 Base) MCG/ACT inhaler Inhale 2 puffs into the lungs every 4 (four) hours as needed for wheezing or shortness of breath.   Yes [provider]  amoxicillin (AMOXIL) 500 MG tablet Take 500 mg by mouth 3 (three) times daily. 08/03/20  Yes [provider]  aspirin EC 81 MG tablet Take 81 mg by mouth at bedtime.    Yes [provider]  azelastine (ASTELIN) 0.1 % nasal spray USE 2 SPRAYS BID UNTIL DIRECTED TO STOP. USE FOR RUNNY NOSE 08/14/17  Yes  [provider]  Buprenorphine HCl-Naloxone HCl 8-2 MG FILM Place 0.5 Film under the tongue daily.   Yes [provider]  cetirizine (ZYRTEC) 10 MG tablet Take 1 tablet (10 mg total) by mouth daily. 03/11/18  Yes Lacy Duverney M, PA-C  clopidogrel (PLAVIX) 75 MG tablet TAKE  1 TABLET(75 MG) BY MOUTH AT BEDTIME 09/16/20  Yes Jettie Booze, MD  dexamethasone (DECADRON) 6 MG tablet dexamethasone 6 mg tablet  TAKE 1 TABLET BY MOUTH EVERY DAY   Yes [provider]  doxycycline (VIBRAMYCIN) 100 MG capsule doxycycline hyclate 100 mg capsule  TAKE 1 CAPSULE BY MOUTH TWICE DAILY   Yes [provider]  fluticasone (FLONASE) 50 MCG/ACT nasal spray Place 1 spray into both nostrils daily.   Yes [provider]  furosemide (LASIX) 40 MG tablet Take 1 tablet (40 mg total) by mouth daily as needed (swelling). 01/16/20  Yes Jettie Booze, MD  ibuprofen (ADVIL,MOTRIN) 600 MG tablet Take 600 mg by mouth as needed for pain. 08/14/17  Yes [provider]  isosorbide mononitrate (IMDUR) 30 MG 24 hr tablet TAKE 1 TABLET(30 MG) BY MOUTH DAILY 09/16/20  Yes Jettie Booze, MD  levothyroxine (SYNTHROID) 88 MCG tablet Take 88 mcg by mouth every morning. 05/26/20  Yes [provider]  metoprolol tartrate (LOPRESSOR) 25 MG tablet TAKE 1 TABLET BY MOUTH TWICE DAILY 09/16/20  Yes Jettie Booze, MD  mupirocin ointment (BACTROBAN) 2 % mupirocin 2 % topical ointment  APPLY TOPICALLY TO LOWER LEG THREE TIMES DAILY FOR 10 DAYS   Yes [provider]  nystatin cream (MYCOSTATIN) Apply topically 2 (two) times daily. 08/11/20  Yes [provider]  potassium chloride SA (KLOR-CON) 20 MEQ tablet Take 1 tablet (20 mEq total) by mouth daily as needed (Take with furosemide (lasix)). 01/16/20  Yes Jettie Booze, MD  predniSONE (DELTASONE) 20 MG tablet prednisone 20 mg tablet   Yes [provider]  rosuvastatin (CRESTOR) 20 MG tablet  TAKE 1 TABLET(20 MG) BY MOUTH DAILY 09/16/20  Yes Jettie Booze, MD  TRELEGY ELLIPTA 100-62.5-25 MCG/INH AEPB Take 1 puff by mouth daily. 08/13/20  Yes [provider]  triamcinolone cream (KENALOG) 0.1 % Apply topically 2 (two) times daily. to affected area 08/11/20  Yes [provider]  metoprolol succinate (TOPROL-XL) 50 MG 24 hr tablet Take 1 tablet (50 mg total) by mouth daily. Take with or immediately following a meal. 09/29/20   Domenic Polite, MD  nitroGLYCERIN (NITROSTAT) 0.4 MG SL tablet Place 1 tablet (0.4 mg total) under the tongue every 5 (five) minutes as needed for chest pain. 03/12/18   Jettie Booze, MD  pantoprazole (PROTONIX) 40 MG tablet Take 1 tablet (40 mg total) by mouth daily. 09/29/20   Domenic Polite, MD  sacubitril-valsartan (ENTRESTO) 24-26 MG Take 1 tablet by mouth 2 (two) times daily. 09/28/20   Domenic Polite, MD   Scheduled Meds: . aspirin  81 mg Oral QHS  . buprenorphine-naloxone  2 tablet Sublingual Daily  . Chlorhexidine Gluconate Cloth  6 each Topical QHS  . clopidogrel  75 mg Oral QHS  . fluticasone  1 spray Each Nare Daily  . insulin aspart  0-15 Units Subcutaneous Q4H  . levothyroxine  88 mcg Oral Q0600  . loratadine  10 mg Oral Daily  . mouth rinse  15 mL Mouth Rinse BID  . metoprolol succinate  50 mg Oral Daily  . midodrine  5 mg Oral TID WC  . mometasone-formoterol  2 puff Inhalation BID  . multivitamin with minerals  1 tablet Oral Daily  . pantoprazole  40 mg Oral Daily  . rosuvastatin  40 mg Oral QHS  . sodium chloride flush  3 mL Intravenous Q12H  . sodium chloride flush  3 mL Intravenous Q12H  Continuous Infusions: . sodium chloride Stopped (09/24/20 1321)  . sodium chloride 250 mL (10/04/20 0653)  . cefTRIAXone (ROCEPHIN)  IV 2 g (10/04/20 0850)  . norepinephrine (LEVOPHED) Adult infusion 2 mcg/min (10/04/20 0858)   PRN Meds:.acetaminophen **OR** acetaminophen, albuterol, artificial tears, docusate,  guaiFENesin, polyethylene glycol  Resolved Hospital Problem list     ASSESSMENT & PLAN   Septic shock Acute systolic congestive heart failure Acute non-ST elevation MI s/p cardiac cath 05/13 recommendation medical management  New diagnosis of A. fib with RVR -Continuous cardiac monitoring -Maintain MAP >65 -Vasopressors as needed to maintain MAP goal -Increase midodrine to 10 mg TID -Patient's ejection fraction on echocardiogram is 30% -Diuresis as renal function and BP permits >> currently on hold due to vasopressors -Cardiology following, input is appreciated -Hold entresto and immediate release metoprolol due to hypotension  -Continue aspirin, plavix, and statin -Off Amiodarone   Severe sepsis due to [place], currently unclear etiology Previous Klebsiella Pneumonia ~ treated (Completed 7 days of Cefepime/Ancef) -Monitor fever curve -Trend WBC's -Follow cultures as above -Blood cultures on 10/02/20 with klebsiella pneumoniae & Enterobacteriaceae  -Continue Ceftriaxone for now pending cultures and sensitivities -Urinalysis and urine culture on 5/21 negative (? Could be impacted by previous exposure to antibiotics) -CXR on 5/21 without acute infiltrate -Consult ID, appreciate input ~ will follow recommendations  Acute hypoxic respiratory failure secondary to Acute COPD Exacerbation & left lung multifocal pneumonia -S/p Extubation 5/7 -Supplemental O2 as needed to maintain O2 sats 88 to 94% -Follow intermittent CXR & ABG as needed -Completed 7 day course of ABX for Klebsiella Pneumonia -Bronchodilators as needed -Continue Dulera -Aggressive pulmonary toilet -PT/OT  Acute metabolic/toxic encephalopathy ~ resolved  -Continue outpatient Suboxone due to narcotic abuse hx   Best practice (right click and "Reselect all SmartList Selections" daily)   Diet: DYS 3 Pain/Anxiety/Delirium protocol: not indicated  VAP protocol: not indicated  DVT  prophylaxis: SCD and subq lovenox  GI prophylaxis: PPI Glucose control:  SSI Yes Central venous access: n/a Arterial line:  N/A Foley: N/A Mobility: Out of bed with assistance as tolerated PT consulted: needs Pt/OT Code Status:  full code Disposition: Stepdown  Critical care time: 40 minutes    Darel Hong, AGACNP-BC [facility] Prefer epic messenger for cross cover needs If after hours, please call E-link

## 2020-10-04 NOTE — Progress Notes (Signed)
Progress Note  Patient Name: Kathryn Garrett Date of Encounter: 10/04/2020  [facility] Cardiologist: Larae Grooms, MD   Subjective   Receiving breathing treatment this morning Heart rate around 100, normal sinus On discussion of how she ended up back into the ICU, she is unclear  Inpatient Medications    Scheduled Meds: . aspirin  81 mg Oral QHS  . buprenorphine-naloxone  2 tablet Sublingual Daily  . Chlorhexidine Gluconate Cloth  6 each Topical QHS  . clopidogrel  75 mg Oral QHS  . fluticasone  1 spray Each Nare Daily  . insulin aspart  0-15 Units Subcutaneous Q4H  . levothyroxine  88 mcg Oral Q0600  . loratadine  10 mg Oral Daily  . mouth rinse  15 mL Mouth Rinse BID  . metoprolol succinate  50 mg Oral Daily  . midodrine  5 mg Oral TID WC  . mometasone-formoterol  2 puff Inhalation BID  . multivitamin with minerals  1 tablet Oral Daily  . pantoprazole  40 mg Oral Daily  . rosuvastatin  40 mg Oral QHS  . sodium chloride flush  3 mL Intravenous Q12H  . sodium chloride flush  3 mL Intravenous Q12H   Continuous Infusions: . sodium chloride Stopped (09/24/20 1321)  . sodium chloride 250 mL (10/04/20 0653)  . cefTRIAXone (ROCEPHIN)  IV 2 g (10/04/20 0850)  . norepinephrine (LEVOPHED) Adult infusion 2 mcg/min (10/04/20 0858)   PRN Meds: acetaminophen **OR** acetaminophen, albuterol, artificial tears, docusate, guaiFENesin, polyethylene glycol   Vital Signs    Vitals:   10/04/20 0730 10/04/20 0734 10/04/20 0746 10/04/20 0830  BP: 128/63   (!) 112/58  Pulse: 100  100 91  Resp: 17  19 17   Temp:  99.7 F (37.6 C)    TempSrc:  Oral    SpO2: 98%  98% 94%  Weight:      Height:        Intake/Output Summary (Last 24 hours) at 10/04/2020 0919 Last data filed at 10/04/2020 0500 Gross per 24 hour  Intake 769.75 ml  Output 925 ml  Net -155.25 ml   Last 3 Weights 10/04/2020 10/03/2020 10/02/2020  Weight (lbs) 121 lb 0.5 oz 120 lb 5.9 oz 120 lb 4.8 oz  Weight  (kg) 54.9 kg 54.6 kg 54.568 kg      Telemetry    Sinus tachycardia rate 100- Personally Reviewed  ECG     - Personally Reviewed  Physical Exam   GEN: No acute distress.   Neck: No JVD Cardiac: RRR, no murmurs, rubs, or gallops.  Respiratory:  Coarse breath sounds GI: Soft, nontender, non-distended  MS: No edema; No deformity. Neuro:  Nonfocal  Psych: Normal affect   Labs    High Sensitivity Troponin:   Recent Labs  Lab 09/10/20 2050 09/11/20 0437 09/11/20 1433 09/11/20 1611 09/11/20 2011  TROPONINIHS 1,838* 2,434* 2,265* 2,455* 2,317*      Chemistry Recent Labs  Lab 10/02/20 1736 10/03/20 0822 10/04/20 0407  NA 134* 138 141  K 3.9 3.5 3.5  CL 99 105 108  CO2 26 25 26   GLUCOSE 142* 136* 123*  BUN 12 10 8   CREATININE 0.58 0.44 0.44  CALCIUM 8.5* 8.4* 8.4*  PROT 6.4*  --   --   ALBUMIN 2.8* 2.5*  --   AST 27  --   --   ALT 28  --   --   ALKPHOS 77  --   --   BILITOT 0.6  --   --  GFRNONAA >60 >60 >60  ANIONGAP 9 8 7      Hematology Recent Labs  Lab 10/02/20 1823 10/03/20 0822 10/04/20 0407  WBC 14.8* 8.6 5.5  RBC 4.02 3.83* 3.44*  HGB 12.2 11.6* 10.4*  HCT 37.0 35.8* 32.5*  MCV 92.0 93.5 94.5  MCH 30.3 30.3 30.2  MCHC 33.0 32.4 32.0  RDW 14.2 14.4 14.3  PLT 297 305 270    BNPNo results for input(s): BNP, PROBNP in the last 168 hours.   DDimer  Recent Labs  Lab 10/02/20 1736  DDIMER 3.61*     Radiology    DG Chest Port 1 View  Result Date: 10/02/2020 CLINICAL DATA:  Shortness of breath EXAM: PORTABLE CHEST 1 VIEW COMPARISON:  09/21/2020 FINDINGS: Heart and mediastinal contours are within normal limits. No focal opacities or effusions. No acute bony abnormality. IMPRESSION: No active disease. Electronically Signed   By: Rolm Baptise M.D.   On: 10/02/2020 15:38    Cardiac Studies     Patient Profile     75 y.o. female with CAD status post RCA PCI in 2017 and in-stent restenosis requiring PCI, VF arrest,, chronic systolic  diastolic heart failure, COPD, ongoing tobacco abuse, hypertension, hyperlipidemia, COPD, and narcotic abuse admitted with COPD exacerbation and Klebsiella pneumonia as well as acute on chronic systolic and diastolic heart failure. Cardiology was initially consulted for elevated troponin. Echo revealed new systolic dysfunction. Hospital course was complicated by cardiogenic shock requiring milrinone for diuresis and hypoxic respiratory failure requiring intubation. She underwent left and right heart cath on 5/13 and was found to have mild to moderate nonobstructive CAD and normal filling pressures. She also had SVT that was managed with metoprolol. Cardiology signed off and now is very engaging due to recurrent tachycardia.  Assessment & Plan    SVT/Sinus tachycardia -Notes indicating heart rates up to 150 on May 21, sinus tachycardia -Amiodarone initially started was discontinued secondary to low blood pressure -Given metoprolol 50 daily, Rates improved Unable to advance beta-blocker given low blood pressure Heart rate likely driven by underlying respiratory issues, fever, sepsis  Bacteremia Enterobacter on blood cultures, gram-negative rods Unclear source, on cefepime, mentating better  CAD s/p PCI Elevated troponin - cardiac cath showed nonobstructive disease Demand ischemia in the setting of above - continue ASA, Plavix, metoprolol and statin  Acute systolic and diastolic heart failure - LVEF reduced to 25-30% Catheterization has been completed, no intervention needed - continue metoprolol And Entresto on hold for low blood pressure    Total encounter time more than 35 minutes  Greater than 50% was spent in counseling and coordination of care with the patient   For questions or updates, please contact [facility] Please consult [email or web address] for contact info under        Signed, Ida Rogue, MD  10/04/2020, 9:19 AM

## 2020-10-04 NOTE — Progress Notes (Signed)
Patient is alert and oriented x 4. Patient urine output has been WDL. Patient has denied having any pain. I have been able to wean the patients levo down to 4. Patient stated that she feels weak so does not want to get up to the bedside commode. I explained to the patient that I understood and told her the only way she will get stronger is if she tries but it is completely up to you. Patient decided that she would like to use the bed pan instead of getting up. Will continue patient care.

## 2020-10-04 NOTE — Progress Notes (Deleted)
Patient alert and oriented to self and place, disoriented to time and situation. Patient has been in and out of sleep. Patient asked for fentanyl after being turned to clean his buttocks (See MAR). Patient refused a bath stated he wants to take a shower. Explained to patient that he is in the ICU and unable to take a shower. Offered the patient CHG wipes and he said yes to the CHG wipe down. Patient has cough and deep breathe to get some of the mucous in his chest up while I suctioned. Patient has had good urine output. Will continue patient care.

## 2020-10-04 NOTE — Progress Notes (Addendum)
Inpatient Rehabilitation Admissions Coordinator  I was notified 5/20 by SW., Shelton Silvas , that family has begun appeal with [employer] medicare for a possible CIR admit. I spoke with daughter, Amy, by phone and asked her to call me when she received a determination.  Danne Baxter, RN, MSN Rehab Admissions Coordinator [phone] 10/04/2020 12:34 PM   I was notified by daughter, Amy, that [employer] [facility] has stated that the decision was upheld and she could call [facility] for further follow up. I explained that this was the denial was upheld that CIR is not approved. She asked that SW call her to discuss her SNF options for she is under the understanding that [employer] [facility] has denied SNF placement. I have notified acute team and TOC SW, Teresita of Amy's request to call her to further discus rehab options.  Danne Baxter, RN, MSN Rehab Admissions Coordinator [phone] 10/04/2020 4:06 PM

## 2020-10-04 NOTE — Progress Notes (Signed)
PROGRESS NOTE    Kathryn Garrett  ZOX:[id number] DOB: 17-Apr-1946 DOA: 09/10/2020 PCP: Imagene Riches, NP   Brief Narrative:  74/F presented to the [facility] ED from home with complaints of shortness of breath -Admitted on 4/29 with hypoxic respiratory failure secondary to COPD exacerbation and community-acquired pneumonia [facility] course was complicated by flash pulmonary edema and NSTEMI requiring mechanical ventilation, intubated on 4/30  Post extubation patient continued to slowly improve.  Initially was slated for CIR however unfortunately insurance denied.  Currently bed search initiated for skilled nursing facility.  Patient remains medically stable for discharge.  Received notification of case management that SNF had been denied by patient's insurance.  Appeal currently in progress.  5/22: Patient had acute onset of tachyarrhythmia yesterday.  Responded to bedside.  Ventricular rates approximately 60.  Unclear etiology as morphology.  Sinus.  Initially started amiodarone infusion however patient then dropped her blood pressure and became febrile.  Was transferred to the ICU overnight due to declining blood pressures despite aggressive fluid resuscitation.  Patient now in ICU.  Evidence of septic shock secondary to Klebsiella bacteremia.  On IV cefepime.  Mentating clearly.  Unclear source of sepsis.   5/23: Dynamics improving.  The exact etiology of the Klebsiella bacteremia is unclear.  Urinalysis did not grow any bacteria.  Case has been discussed with PCCM.  The Klebsiella pneumonia is the same bacteria that was found on tracheal aspirate when patient was intubated.  This raises possibility of another infectious focus somewhere in the body.  Considering TEE and will pursue MRI total spine to rule out spinal abscess.   Assessment & Plan:   Principal Problem:   Acute exacerbation of chronic obstructive pulmonary disease (COPD) (HCC) Active Problems:   CAP (community acquired  pneumonia)   CAD in native artery   HTN (hypertension)   Tobacco abuse   Hyperlipidemia LDL goal <70   Severe sepsis (HCC)   NSTEMI (non-ST elevated myocardial infarction) (HCC)   Hyperglycemia   SOB (shortness of breath)   Acute respiratory failure (HCC)   HFrEF (heart failure with reduced ejection fraction) (HCC)   Acute on chronic HFrEF (heart failure with reduced ejection fraction) (HCC)   SVT (supraventricular tachycardia) (HCC)   Acute combined systolic and diastolic heart failure (HCC)   Pure hypercholesterolemia  Septic shock Secondary to Klebsiella bacteremia Unclear source of bacteremia Does include urine (urine clean, spinal abscess, valvular vegetation Currently on 2 mics of Levophed Initiating oral vasopressor support to wean from norepinephrine Plan: Continue vasopressor support Midodrine 10 mg 3 times daily Wean as tolerated PCCM consultation Continue Rocephin 2 g every 24 hours Monitor vitals and fever curve Status for now  Acute hypoxic respiratory failure  Acute COPD Exacerbation/left lung multifocal pneumonia Flash pulmonary edema on 4/30 -Intubated for flash pulmonary edema on, extubated 5/7 -Clinically improving, completed antibiotic course -Treated with diuretics as well, now off -Improved and stable, weaned off O2 -Discharge planning, CIR declined -Now bed search initiated for skilled nursing facility -[facility] aware and is following for placement options -Patient has had a complicated hospital course and is significantly debilitated.  In my opinion she is unsafe to return home.  She does have recovery potential and would benefit from standard short-term rehab.  Per case management appeal process has been initiated. -Appeal has been upheld by [employer].  Will pursue skilled nursing facility at time of discharge.  Disposition plan pending  Sepsis, left lower lobe pneumonia Dysphagia -tracheal aspirate on 4/30 with KLEBSIELLA PNEUMONIAE (  resistant to  Ampicillin) -Completed 7 days of antibiotics. -Improving -SLP following, continues to have dysphagia, currently on dysphagia 3, honey thick liquids -Will need close SLP FU at rehab to monitor for continued improvement and changes to diet -Currently on honey thick liquids.  Intermittently tolerating p.o. intake.  Acute systolic congestive heart failure/Flash pulmonary edema NSTEMI Paroxysmal SVT -Echo noted with drop in EF down to 30%  -Briefly required inotropes in the ICU, this was then weaned off -Cardiology consulted, left heart cath noted nonobstructive coronary disease, recommended medical management and repeat echo in 2 to 3 months -Continueaspirin, Plavix, metoprolol, Entresto -Clinically euvolemic at this time -Follow-up with Dr. Irish Lack in 2 to 3 weeks  Acute metabolic/toxic encephalopathy -ICU stay complicated by severe encephalopathy while on vent -At baseline on buprenorphine/naloxone which was held on admission, now resumed -Also had an MRI brain which was unremarkable -Improved and stable now, PT OT as tolerated -CIR recommended for rehab.  Declined.  SNF bed search initiated -SNF also declined.  Family filing appeal with [employer].  COPD Ongoing tobacco abuse -Counseled, tapered off prednisone, continue duo nebs   DVT prophylaxis: SQ Lovenox Code Status: Full Family Communication: Daughter [name] [phone] on 5/21, 5/23 Disposition Plan: Status is: Inpatient  Remains inpatient appropriate because:Inpatient level of care appropriate due to severity of illness   Dispo: The patient is from: Home              Anticipated d/c is to: SNF              Patient currently is not medically stable to d/c.   Difficult to place patient No   Recurrent sepsis secondary to Klebsiella pneumonia bacteremia.  Unclear source.  Currently Investigating persistent source of bacteria.           Level ofcare: Stepdown  Consultants:   Palliative  care  Procedures:   None  Antimicrobials:   None   Subjective: Patient seen and examined.  Mentating clearly.  Appears a little fatigued.  Objective: Vitals:   10/04/20 0746 10/04/20 0830 10/04/20 0930 10/04/20 1010  BP:  (!) 112/58 128/62   Pulse: 100 91 (!) 111 (!) 101  Resp: 19 17 (!) 21 18  Temp:      TempSrc:      SpO2: 98% 94% 98% 99%  Weight:      Height:        Intake/Output Summary (Last 24 hours) at 10/04/2020 1709 Last data filed at 10/04/2020 0500 Gross per 24 hour  Intake 437.22 ml  Output 425 ml  Net 12.22 ml   Filed Weights   10/02/20 0334 10/03/20 0500 10/04/20 0437  Weight: 54.6 kg 54.6 kg 54.9 kg    Examination:  General exam: Appears calm and comfortable.  Appears fatigued Respiratory system: Clear to auscultation. Respiratory effort normal. Cardiovascular system: S1 & S2 heard, RRR. No JVD, murmurs, rubs, gallops or clicks. No pedal edema. Gastrointestinal system: Abdomen is nondistended, soft and nontender. No organomegaly or masses felt. Normal bowel sounds heard. Central nervous system: Alert and oriented. No focal neurological deficits. Extremities: Symmetric 5 x 5 power. Skin: No rashes, lesions or ulcers Psychiatry: Judgement and insight appear normal. Mood & affect appropriate.     Data Reviewed: I have personally reviewed following labs and imaging studies  CBC: Recent Labs  Lab 09/29/20 0442 09/30/20 0430 10/02/20 1823 10/03/20 0822 10/04/20 0407  WBC 12.2* 8.4 14.8* 8.6 5.5  NEUTROABS 7.3 4.6 12.9* 6.4 3.4  HGB 12.1  10.9* 12.2 11.6* 10.4*  HCT 38.2 34.8* 37.0 35.8* 32.5*  MCV 96.2 94.6 92.0 93.5 94.5  PLT 372 351 297 305 858   Basic Metabolic Panel: Recent Labs  Lab 09/30/20 0430 10/02/20 0508 10/02/20 1736 10/03/20 0822 10/04/20 0407  NA 137 138 134* 138 141  K 3.4* 3.3* 3.9 3.5 3.5  CL 106 104 99 105 108  CO2 24 27 26 25 26   GLUCOSE 109* 110* 142* 136* 123*  BUN 20 10 12 10 8   CREATININE 0.56 0.49 0.58  0.44 0.44  CALCIUM 8.5* 8.5* 8.5* 8.4* 8.4*  MG  --   --   --  1.7 1.8  PHOS  --   --   --  3.3 2.9   GFR: Estimated Creatinine Clearance: 45.9 mL/min (by C-G formula based on SCr of 0.44 mg/dL). Liver Function Tests: Recent Labs  Lab 10/02/20 1736 10/03/20 0822  AST 27  --   ALT 28  --   ALKPHOS 77  --   BILITOT 0.6  --   PROT 6.4*  --   ALBUMIN 2.8* 2.5*   No results for input(s): LIPASE, AMYLASE in the last 168 hours. No results for input(s): AMMONIA in the last 168 hours. Coagulation Profile: Recent Labs  Lab 10/02/20 1736  INR 1.4*   Cardiac Enzymes: No results for input(s): CKTOTAL, CKMB, CKMBINDEX, TROPONINI in the last 168 hours. BNP (last 3 results) No results for input(s): PROBNP in the last 8760 hours. HbA1C: No results for input(s): HGBA1C in the last 72 hours. CBG: Recent Labs  Lab 10/04/20 0034 10/04/20 0413 10/04/20 0741 10/04/20 1145 10/04/20 1549  GLUCAP 112* 124* 126* 130* 130*   Lipid Profile: No results for input(s): CHOL, HDL, LDLCALC, TRIG, CHOLHDL, LDLDIRECT in the last 72 hours. Thyroid Function Tests: No results for input(s): TSH, T4TOTAL, FREET4, T3FREE, THYROIDAB in the last 72 hours. Anemia Panel: No results for input(s): VITAMINB12, FOLATE, FERRITIN, TIBC, IRON, RETICCTPCT in the last 72 hours. Sepsis Labs: Recent Labs  Lab 10/02/20 1736 10/02/20 1824 10/02/20 2130  PROCALCITON 1.93  --   --   LATICACIDVEN  --  2.4* 1.1    Recent Results (from the past 240 hour(s))  Culture, blood (x 2)     Status: Abnormal (Preliminary result)   Collection Time: 10/02/20  6:24 PM   Specimen: BLOOD  Result Value Ref Range Status   Specimen Description   Final    BLOOD BLOOD RIGHT HAND Performed at [facility], [address] [place], [place] [address]    Special Requests   Final    BOTTLES DRAWN AEROBIC AND ANAEROBIC Blood Culture adequate volume Performed at [facility], [address] [place], [place]  [address]    Culture  Setup Time   Final    GRAM NEGATIVE RODS IN BOTH AEROBIC AND ANAEROBIC BOTTLES CRITICAL RESULT CALLED TO, READ BACK BY AND VERIFIED WITH: NATHAN BELUE AT [place] 10/03/2020 [place].    Culture (A)  Final    KLEBSIELLA PNEUMONIAE SUSCEPTIBILITIES TO FOLLOW Performed at [facility], [place] [address], [place], [place] [address]    Report Status PENDING  Incomplete  Blood Culture ID Panel (Reflexed)     Status: Abnormal   Collection Time: 10/02/20  6:24 PM  Result Value Ref Range Status   Enterococcus faecalis NOT DETECTED NOT DETECTED Final   Enterococcus Faecium NOT DETECTED NOT DETECTED Final   Listeria monocytogenes NOT DETECTED NOT DETECTED Final   Staphylococcus species  NOT DETECTED NOT DETECTED Final   Staphylococcus aureus (BCID) NOT DETECTED NOT DETECTED Final   Staphylococcus epidermidis NOT DETECTED NOT DETECTED Final   Staphylococcus lugdunensis NOT DETECTED NOT DETECTED Final   Streptococcus species NOT DETECTED NOT DETECTED Final   Streptococcus agalactiae NOT DETECTED NOT DETECTED Final   Streptococcus pneumoniae NOT DETECTED NOT DETECTED Final   Streptococcus pyogenes NOT DETECTED NOT DETECTED Final   A.calcoaceticus-baumannii NOT DETECTED NOT DETECTED Final   Bacteroides fragilis NOT DETECTED NOT DETECTED Final   Enterobacterales DETECTED (A) NOT DETECTED Final    Comment: Enterobacterales represent a large order of gram negative bacteria, not a single organism. CRITICAL RESULT CALLED TO, READ BACK BY AND VERIFIED WITH: NATHAN BELUE AT 0708 ON 10/03/2020 [place].    Enterobacter cloacae complex NOT DETECTED NOT DETECTED Final   Escherichia coli NOT DETECTED NOT DETECTED Final   Klebsiella aerogenes NOT DETECTED NOT DETECTED Final   Klebsiella oxytoca NOT DETECTED NOT DETECTED Final   Klebsiella pneumoniae DETECTED (A) NOT DETECTED Corrected    Comment: CORRECTED ON 05/22 AT 0825: PREVIOUSLY REPORTED AS DETECTED CRITICAL RESULT CALLED TO, READ BACK BY  AND VERIFIED WITH: NATHAN BELUE AT 0708 ON 10/03/2020 [place].   Proteus species NOT DETECTED NOT DETECTED Final   Salmonella species NOT DETECTED NOT DETECTED Final   Serratia marcescens NOT DETECTED NOT DETECTED Final   Haemophilus influenzae NOT DETECTED NOT DETECTED Final   Neisseria meningitidis NOT DETECTED NOT DETECTED Final   Pseudomonas aeruginosa NOT DETECTED NOT DETECTED Final   Stenotrophomonas maltophilia NOT DETECTED NOT DETECTED Final   Candida albicans NOT DETECTED NOT DETECTED Final   Candida auris NOT DETECTED NOT DETECTED Final   Candida glabrata NOT DETECTED NOT DETECTED Final   Candida krusei NOT DETECTED NOT DETECTED Final   Candida parapsilosis NOT DETECTED NOT DETECTED Final   Candida tropicalis NOT DETECTED NOT DETECTED Final   Cryptococcus neoformans/gattii NOT DETECTED NOT DETECTED Final   CTX-M ESBL NOT DETECTED NOT DETECTED Final   Carbapenem resistance IMP NOT DETECTED NOT DETECTED Final   Carbapenem resistance KPC NOT DETECTED NOT DETECTED Final   Carbapenem resistance NDM NOT DETECTED NOT DETECTED Final   Carbapenem resist OXA 48 LIKE NOT DETECTED NOT DETECTED Final   Carbapenem resistance VIM NOT DETECTED NOT DETECTED Final    Comment: Performed at [facility], [address] [place], [place] [address]  Culture, blood (x 2)     Status: Abnormal (Preliminary result)   Collection Time: 10/02/20  6:32 PM   Specimen: BLOOD  Result Value Ref Range Status   Specimen Description   Final    BLOOD RIGHT ANTECUBITAL Performed at [facility], [address] [place], [place] [address]    Special Requests   Final    BOTTLES DRAWN AEROBIC AND ANAEROBIC Blood Culture results may not be optimal due to an excessive volume of blood received in culture bottles Performed at [facility], [address] [place], [place] [address]    Culture  Setup Time   Final    GRAM NEGATIVE RODS IN BOTH AEROBIC AND ANAEROBIC BOTTLES CRITICAL VALUE  NOTED.  VALUE IS CONSISTENT WITH PREVIOUSLY REPORTED AND CALLED VALUE. Performed at [facility], [address] [place], [place] [address]    Culture KLEBSIELLA PNEUMONIAE (A)  Final   Report Status PENDING  Incomplete  Culture, Urine     Status: None   Collection Time: 10/03/20  8:52 AM   Specimen: Urine, Clean Catch  Result  Value Ref Range Status   Specimen Description   Final    URINE, CLEAN CATCH Performed at [facility], [address] [place], [place] [address]    Special Requests   Final    NONE Performed at [facility], [address] [place], [place] [address]    Culture   Final    NO GROWTH Performed at [facility], [place] [address], [place], [place] [address]    Report Status 10/04/2020 FINAL  Final         Radiology Studies: No results found.      Scheduled Meds: . aspirin  81 mg Oral QHS  . buprenorphine-naloxone  2 tablet Sublingual Daily  . Chlorhexidine Gluconate Cloth  6 each Topical QHS  . clopidogrel  75 mg Oral QHS  . fluticasone  1 spray Each Nare Daily  . insulin aspart  0-15 Units Subcutaneous Q4H  . levothyroxine  88 mcg Oral Q0600  . loratadine  10 mg Oral Daily  . mouth rinse  15 mL Mouth Rinse BID  . metoprolol succinate  50 mg Oral Daily  . midodrine  10 mg Oral TID WC  . mometasone-formoterol  2 puff Inhalation BID  . multivitamin with minerals  1 tablet Oral Daily  . pantoprazole  40 mg Oral Daily  . rosuvastatin  40 mg Oral QHS  . sodium chloride flush  3 mL Intravenous Q12H  . sodium chloride flush  3 mL Intravenous Q12H   Continuous Infusions: . sodium chloride Stopped (09/24/20 1321)  . sodium chloride 250 mL (10/04/20 0653)  . cefTRIAXone (ROCEPHIN)  IV 2 g (10/04/20 0850)  . norepinephrine (LEVOPHED) Adult infusion Stopped (10/04/20 1014)     LOS: 24 days    Time spent: 25 minutes    Sidney Ace, MD [facility] Pager [phone]  If 7PM-7AM, please  contact night-coverage 10/04/2020, 5:09 PM

## 2020-10-04 NOTE — Evaluation (Signed)
Physical Therapy Evaluation Patient Details Name: Kathryn Garrett MRN: [id number] DOB: 01-May-1946 Today's Date: 10/04/2020   History of Present Illness  75 y.o. female with history of CAD with VF arrest s/p PCI to the RCA in 2017 s/p PTCA to the RCA for ISR in 2018, chronic combined systolic and diastolic CHF, COPD with ongoing tobacco use, HTN, HLD, anemia, and narcotic abuse on Suboxone therapy admitted with acute hypoxic respiratory failure requiring mechanical ventilation (extubated 09/17/2020) secondary to COPD exacerbation Klebsiella PNA and acute on chronic combined CHF with transient ST elevation and echo demonstrating new LV dysfunction. Patient with recent transfer to ICU for blood pressure issues requiring pressor support  Clinical Impression  Patient seen for re-evaluation after transfer to ICU for pressor support and blood pressure issues. Pressors are being weaned today per report and blood pressure stable during mobility efforts today. Heart rate up to 144bpm with standing activity. Patient required increased assistance and with decreased overall activity tolerance since transfer to ICU. Patient was able to get OOB and ambulate a short distance to the chair with assistance and fatigue with activity. Plan of care and goals updated accordingly. Patient still remains appropriate for CIR with current therapy needs. PT will continue to follow to maximize independence and facilitate return to prior level of function.     Follow Up Recommendations CIR    Equipment Recommendations  None recommended by PT    Recommendations for Other Services       Precautions / Restrictions Precautions Precautions: Fall Restrictions Weight Bearing Restrictions: No      Mobility  Bed Mobility Overal bed mobility: Needs Assistance Bed Mobility: Sidelying to Sit     Supine to sit: Min assist     General bed mobility comments: assistance for scooting in bed. verbal cues for sequencing and  technique. increased time required to complete tasks due to fatigue with activity    Transfers Overall transfer level: Needs assistance Equipment used: Rolling walker (2 wheeled) Transfers: Sit to/from Stand Sit to Stand: Mod assist         General transfer comment: Mod A for lifting assistance provided. verbal cues for hand placement for safety  Ambulation/Gait Ambulation/Gait assistance: Min assist Gait Distance (Feet): 3 Feet Assistive device: Rolling walker (2 wheeled) Gait Pattern/deviations: Narrow base of support Gait velocity: decreased   General Gait Details: patient was able to ambulate from bed to chair with steadying assistance provided. verbal cues for safety and technique with upright activity. heart rate noted to be as high as 144bpm with activity and quickly decreased to 110bpm with seated rest break. Sp02 94% on room air. and no dizziness is reported with activity. patient was fatigued with minimal standing activity and unable to progress activity further at this time  Stairs            Wheelchair Mobility    Modified Rankin (Stroke Patients Only)       Balance Overall balance assessment: Needs assistance Sitting-balance support: No upper extremity supported;Feet unsupported Sitting balance-Leahy Scale: Fair     Standing balance support: Bilateral upper extremity supported;During functional activity Standing balance-Leahy Scale: Fair Standing balance comment: patient relying on rolling walker for support in standing                             Pertinent Vitals/Pain Pain Assessment: No/denies pain    Home Living Family/patient expects to be discharged to:: Private residence Living Arrangements: Alone Available  Help at Discharge: Family;Available 24 hours/day Type of Home: Apartment Home Access: Stairs to enter Entrance Stairs-Rails: Psychiatric nurse of Steps: flight Home Layout: One level Home Equipment: None       Prior Function Level of Independence: Independent               Hand Dominance        Extremity/Trunk Assessment   Upper Extremity Assessment Upper Extremity Assessment: Generalized weakness;Defer to OT evaluation    Lower Extremity Assessment Lower Extremity Assessment: Generalized weakness (endurance impaired for sustained activity)       Communication   Communication: No difficulties  Cognition Arousal/Alertness: Awake/alert Behavior During Therapy: WFL for tasks assessed/performed Overall Cognitive Status: Within Functional Limits for tasks assessed                                        General Comments      Exercises     Assessment/Plan    PT Assessment Patient needs continued PT services  PT Problem List Decreased safety awareness;Decreased strength;Decreased mobility;Decreased activity tolerance;Cardiopulmonary status limiting activity;Decreased balance;Decreased knowledge of use of DME;Decreased cognition       PT Treatment Interventions DME instruction;Therapeutic activities;Cognitive remediation;Therapeutic exercise;Gait training;Patient/family education;Balance training;Wheelchair mobility training;Functional mobility training;Neuromuscular re-education;Stair training    PT Goals (Current goals can be found in the Care Plan section)  Acute Rehab PT Goals Patient Stated Goal: to get stronger PT Goal Formulation: With patient Time For Goal Achievement: 10/18/20 Potential to Achieve Goals: Fair    Frequency Min 2X/week   Barriers to discharge Inaccessible home environment;Decreased caregiver support      Co-evaluation PT/OT/SLP Co-Evaluation/Treatment: Yes Reason for Co-Treatment: Complexity of the patient's impairments (multi-system involvement) PT goals addressed during session: Mobility/safety with mobility OT goals addressed during session: ADL's and self-care       AM-PAC PT "6 Clicks" Mobility  Outcome Measure  Help needed turning from your back to your side while in a flat bed without using bedrails?: A Little Help needed moving from lying on your back to sitting on the side of a flat bed without using bedrails?: A Lot Help needed moving to and from a bed to a chair (including a wheelchair)?: A Lot Help needed standing up from a chair using your arms (e.g., wheelchair or bedside chair)?: A Lot Help needed to walk in hospital room?: A Lot Help needed climbing 3-5 steps with a railing? : Total 6 Click Score: 12    End of Session Equipment Utilized During Treatment: Gait belt Activity Tolerance: Patient limited by fatigue Patient left: in chair;with call bell/phone within reach Nurse Communication: Mobility status PT Visit Diagnosis: Difficulty in walking, not elsewhere classified (R26.2);Muscle weakness (generalized) (M62.81);Unsteadiness on feet (R26.81)    Time: 7858-8502 PT Time Calculation (min) (ACUTE ONLY): 30 min   Charges:   PT Evaluation $PT Re-evaluation: 1 Re-eval PT Treatments $Therapeutic Activity: 8-22 mins        Minna Merritts, PT, MPT   Percell Locus 10/04/2020, 11:20 AM

## 2020-10-04 NOTE — Evaluation (Signed)
Occupational Therapy Re-Evaluation Patient Details Name: Kathryn Garrett MRN: [id number] DOB: [birth year] Today's Date: 10/04/2020    History of Present Illness 75 y.o. female with history of CAD with VF arrest s/p PCI to the RCA in 2017 s/p PTCA to the RCA for ISR in 2018, chronic combined systolic and diastolic CHF, COPD with ongoing tobacco use, HTN, HLD, anemia, and narcotic abuse on Suboxone therapy admitted with acute hypoxic respiratory failure requiring mechanical ventilation (extubated 09/17/2020) secondary to COPD exacerbation Klebsiella PNA and acute on chronic combined CHF with transient ST elevation and echo demonstrating new LV dysfunction. Patient with recent transfer to ICU for blood pressure issues requiring pressor support   Clinical Impression   Kathryn Garrett was seen for OT re-evaluation on this date, seen with PT 2/2 increased complexity. Upon arrival to room pt reclined in bed eating breakfast, agreeable to tx. Pt eager to get OOB to chair, requires MIN A exit bed and MOD A sit<>stand (+2 for lines mgmt only). HR increased to 144 bpm in standing, RN notified. Pt requires MOD A for LBD at bed level. MIN A don/doff gown seated EOC. Pt requires BUE support for standing with poor tolerance, unable to progress standing ADLs this date. Pt continues to benefit from skilled OT services to maximize return to PLOF and minimize risk of future falls, injury, caregiver burden, and readmission. Will continue to follow POC. Discharge recommendation remains appropriate.      Follow Up Recommendations  CIR    Equipment Recommendations  3 in 1 bedside commode;Tub/shower seat    Recommendations for Other Services Rehab consult     Precautions / Restrictions Precautions Precautions: Fall Restrictions Weight Bearing Restrictions: No      Mobility Bed Mobility Overal bed mobility: Needs Assistance Bed Mobility: Sidelying to Sit     Supine to sit: Min assist     General bed mobility  comments: assist scooting to EOB    Transfers Overall transfer level: Needs assistance Equipment used: Rolling walker (2 wheeled) Transfers: Sit to/from Stand Sit to Stand: Mod assist         General transfer comment: Mod A for lifting assistance provided. verbal cues for hand placement for safety    Balance Overall balance assessment: Needs assistance Sitting-balance support: No upper extremity supported;Feet unsupported Sitting balance-Leahy Scale: Fair     Standing balance support: Bilateral upper extremity supported;During functional activity Standing balance-Leahy Scale: Fair Standing balance comment: patient relying on rolling walker for support in standing                           ADL either performed or assessed with clinical judgement   ADL Overall ADL's : Needs assistance/impaired                                       General ADL Comments: MOD A for LBD at bed level. MOD A + RW for ADL t/f. MIN A don/doff gown seated EOB. Pt requires BUE support for standing with poor tolerance, unable to progress standing ADLs this date.                  Pertinent Vitals/Pain Pain Assessment: No/denies pain        Extremity/Trunk Assessment Upper Extremity Assessment Upper Extremity Assessment: Generalized weakness   Lower Extremity Assessment Lower Extremity Assessment: Generalized weakness  Cognition Arousal/Alertness: Awake/alert Behavior During Therapy: WFL for tasks assessed/performed Overall Cognitive Status: Within Functional Limits for tasks assessed                                     General Comments  HR 144 bpm in standing    Exercises Exercises: Other exercises Other Exercises Other Exercises: Pt educated re: OT role, DME recs, d/c recs, falls prevention, ECS, HEP Other Exercises: LBD, UBD, sup>sit, sit<>stand, ~2 ft mobility, sitting/standing balance/tolerance                      OT  Goals(Current goals can be found in the care plan section) Acute Rehab OT Goals Patient Stated Goal: to get stronger OT Goal Formulation: With patient/family Time For Goal Achievement: 10/18/20 Potential to Achieve Goals: Good ADL Goals Pt Will Perform Grooming: with supervision;sitting;with min assist Pt Will Perform Upper Body Bathing: with modified independence;sitting Pt Will Transfer to Toilet: with supervision;ambulating;regular height toilet (c LRAD PRN) Pt Will Perform Toileting - Clothing Manipulation and hygiene: Independently Pt/caregiver will Perform Home Exercise Program: Increased strength;Both right and left upper extremity;With minimal assist Additional ADL Goal #1: Pt will tolerate >5 min standing grooming task c SBA and LRAD PRN  OT Frequency: Min 3X/week           Co-evaluation PT/OT/SLP Co-Evaluation/Treatment: Yes Reason for Co-Treatment: Complexity of the patient's impairments (multi-system involvement) PT goals addressed during session: Mobility/safety with mobility OT goals addressed during session: ADL's and self-care      AM-PAC OT "6 Clicks" Daily Activity     Outcome Measure Help from another person eating meals?: A Little Help from another person taking care of personal grooming?: A Lot Help from another person toileting, which includes using toliet, bedpan, or urinal?: A Little Help from another person bathing (including washing, rinsing, drying)?: A Little Help from another person to put on and taking off regular upper body clothing?: A Little Help from another person to put on and taking off regular lower body clothing?: A Lot 6 Click Score: 16   End of Session Equipment Utilized During Treatment: Rolling walker Nurse Communication: Mobility status  Activity Tolerance: Patient tolerated treatment well Patient left: in chair;with call bell/phone within reach;with nursing/sitter in room  OT Visit Diagnosis: Unsteadiness on feet (R26.81);Muscle  weakness (generalized) (M62.81);Other symptoms and signs involving cognitive function                Time: 3299-2426 OT Time Calculation (min): 29 min Charges:  OT General Charges $OT Visit: 1 Visit OT Evaluation $OT Re-eval: 1 Re-eval OT Treatments $Self Care/Home Management : 8-22 mins   Dessie Coma, M.S. OTR/L  10/04/20, 3:30 PM  ascom [phone]

## 2020-10-04 NOTE — TOC Progression Note (Signed)
Transition of Care [facility]) - Progression Note    Patient Details  Name: Kathryn Garrett MRN: [id number] Date of Birth: [birth year]  Transition of Care [facility]) CM/SW [place], [place] Phone Number: [phone] 10/04/2020, 4:36 PM  Clinical Narrative:     CSW spoke with patient's [name] (Daughter) [phone] with update on patient discharge plan.  [place] medicare has upheld decision to decline patient for CIR placement.  Patient has SNF placement offers.  Ms. Carmie End would prefer a closer location and will contact [employer] to find out which SNF is in network.  CSW gave Ms. Rathborne the names of the SNF facilities which have made bed offers for the patient. Ms. Carey Bullocks stated she would contact CSW tomorrow 10/05/2020.   Barriers to Discharge: Continued Medical Work up  Expected Discharge Plan and Services   In-house Referral: Clinical Social Work   Post Acute Care Choice:  [facility]) Living arrangements for the past 2 months: Single Family Home                                       Social Determinants of Health (SDOH) Interventions    Readmission Risk Interventions No flowsheet data found.

## 2020-10-05 ENCOUNTER — Inpatient Hospital Stay: Payer: Medicare HMO

## 2020-10-05 ENCOUNTER — Ambulatory Visit: Payer: Medicare HMO | Admitting: Family

## 2020-10-05 DIAGNOSIS — Z7189 Other specified counseling: Secondary | ICD-10-CM | POA: Diagnosis not present

## 2020-10-05 DIAGNOSIS — J441 Chronic obstructive pulmonary disease with (acute) exacerbation: Secondary | ICD-10-CM | POA: Diagnosis not present

## 2020-10-05 LAB — BLOOD GAS, ARTERIAL
Acid-base deficit: 1.6 mmol/L (ref 0.0–2.0)
Bicarbonate: 20.9 mmol/L (ref 20.0–28.0)
FIO2: 0.28
O2 Saturation: 94.2 %
Patient temperature: 37
pCO2 arterial: 28 mmHg — ABNORMAL LOW (ref 32.0–48.0)
pH, Arterial: 7.48 — ABNORMAL HIGH (ref 7.350–7.450)
pO2, Arterial: 66 mmHg — ABNORMAL LOW (ref 83.0–108.0)

## 2020-10-05 LAB — BASIC METABOLIC PANEL
Anion gap: 7 (ref 5–15)
BUN: 10 mg/dL (ref 8–23)
CO2: 26 mmol/L (ref 22–32)
Calcium: 8.2 mg/dL — ABNORMAL LOW (ref 8.9–10.3)
Chloride: 101 mmol/L (ref 98–111)
Creatinine, Ser: 0.49 mg/dL (ref 0.44–1.00)
GFR, Estimated: >60 mL/min (ref >60)
Glucose, Bld: 101 mg/dL — ABNORMAL HIGH (ref 70–99)
Potassium: 4.1 mmol/L (ref 3.5–5.1)
Sodium: 134 mmol/L — ABNORMAL LOW (ref 135–145)

## 2020-10-05 LAB — CBC WITH DIFFERENTIAL/PLATELET
Abs Immature Granulocytes: 0.02 10*3/uL (ref 0.00–0.07)
Basophils Absolute: 0 10*3/uL (ref 0.0–0.1)
Basophils Relative: 0 %
Eosinophils Absolute: 0.1 10*3/uL (ref 0.0–0.5)
Eosinophils Relative: 1 %
HCT: 30.7 % — ABNORMAL LOW (ref 36.0–46.0)
Hemoglobin: 10 g/dL — ABNORMAL LOW (ref 12.0–15.0)
Immature Granulocytes: 0 %
Lymphocytes Relative: 17 %
Lymphs Abs: 0.9 10*3/uL (ref 0.7–4.0)
MCH: 30.5 pg (ref 26.0–34.0)
MCHC: 32.6 g/dL (ref 30.0–36.0)
MCV: 93.6 fL (ref 80.0–100.0)
Monocytes Absolute: 0.5 10*3/uL (ref 0.1–1.0)
Monocytes Relative: 9 %
Neutro Abs: 4 10*3/uL (ref 1.7–7.7)
Neutrophils Relative %: 73 %
Platelets: 211 10*3/uL (ref 150–400)
RBC: 3.28 MIL/uL — ABNORMAL LOW (ref 3.87–5.11)
RDW: 14.6 % (ref 11.5–15.5)
WBC: 5.6 10*3/uL (ref 4.0–10.5)
nRBC: 0 % (ref 0.0–0.2)

## 2020-10-05 LAB — CULTURE, BLOOD (ROUTINE X 2): Special Requests: ADEQUATE

## 2020-10-05 LAB — GLUCOSE, CAPILLARY
Glucose-Capillary: 100 mg/dL — ABNORMAL HIGH (ref 70–99)
Glucose-Capillary: 106 mg/dL — ABNORMAL HIGH (ref 70–99)
Glucose-Capillary: 108 mg/dL — ABNORMAL HIGH (ref 70–99)
Glucose-Capillary: 126 mg/dL — ABNORMAL HIGH (ref 70–99)
Glucose-Capillary: 137 mg/dL — ABNORMAL HIGH (ref 70–99)
Glucose-Capillary: 171 mg/dL — ABNORMAL HIGH (ref 70–99)
Glucose-Capillary: 85 mg/dL (ref 70–99)

## 2020-10-05 LAB — PHOSPHORUS: Phosphorus: 2.5 mg/dL (ref 2.5–4.6)

## 2020-10-05 LAB — MAGNESIUM: Magnesium: 2.1 mg/dL (ref 1.7–2.4)

## 2020-10-05 IMAGING — DX DG CHEST 1V PORT
1 series · 1 of 1 positions shown · non-contrast
Comparison: [DATE], [DATE]

CLINICAL DATA: 74-year-old female with shortness of breath.

EXAM:
PORTABLE CHEST - 1 VIEW

[chest ap]
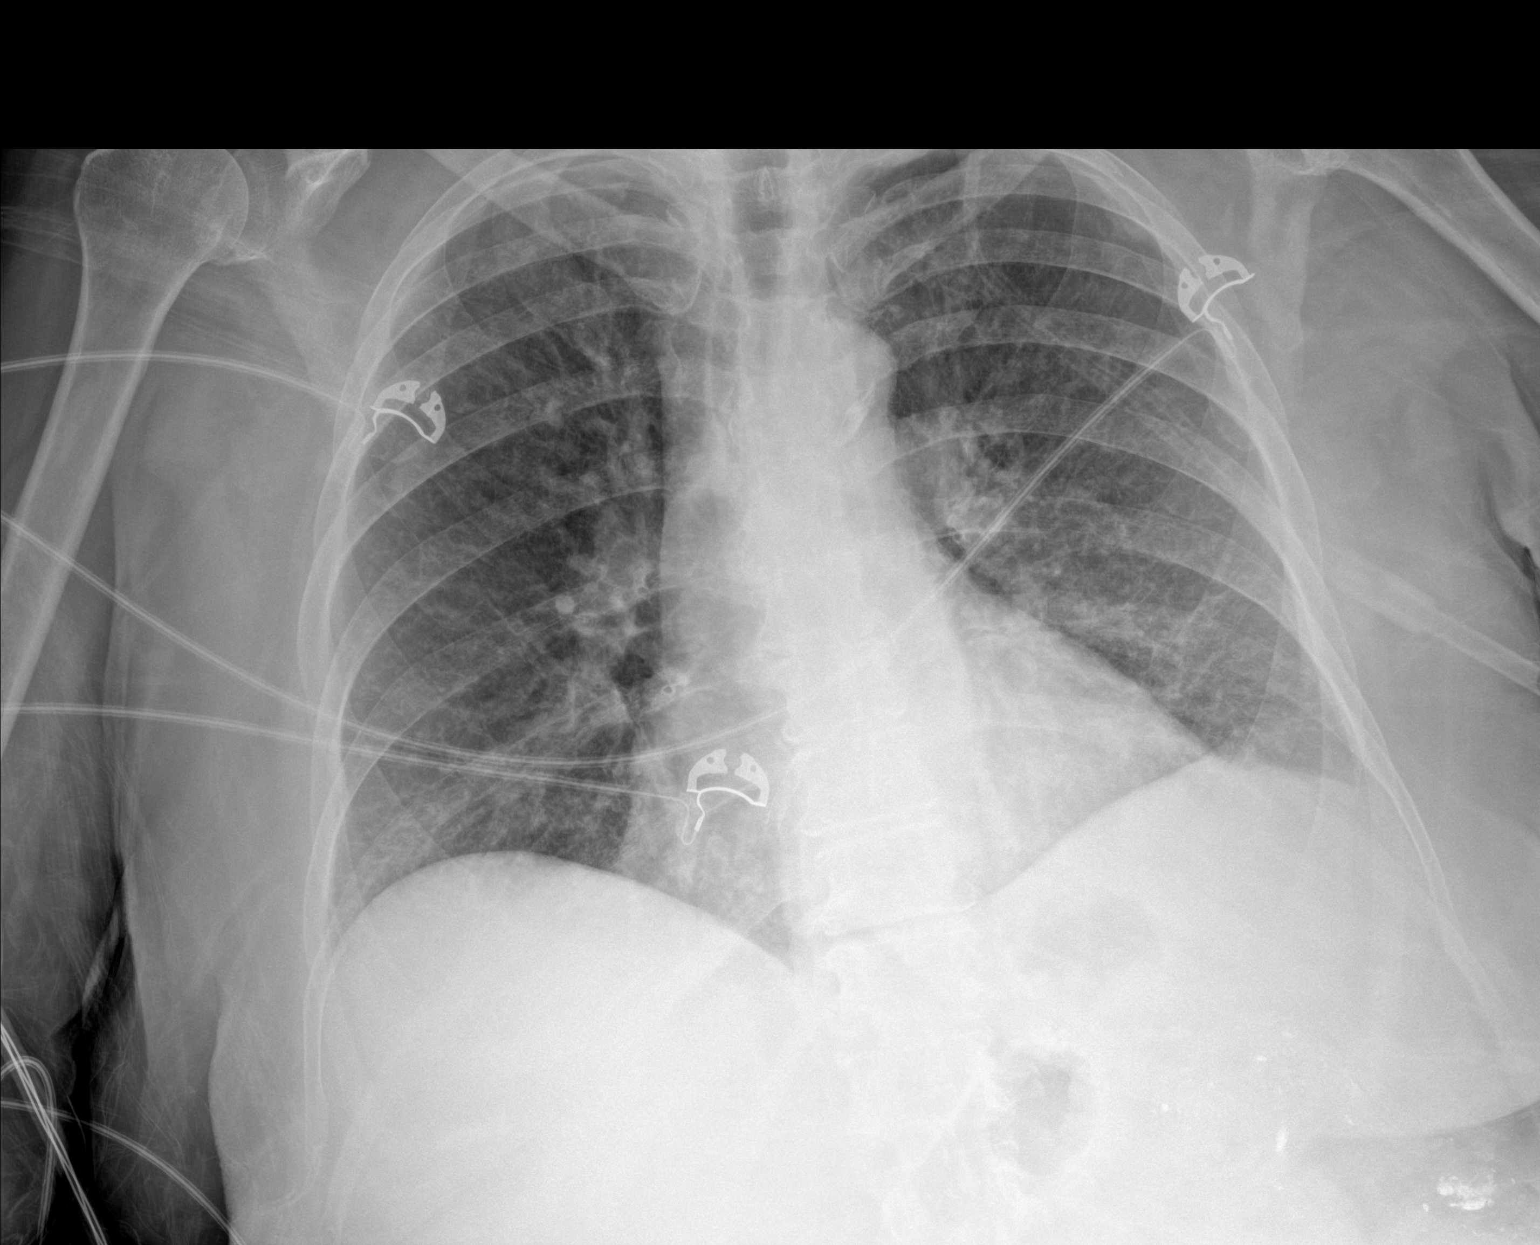

[1 of 1 positions shown; findings below may reference images not displayed]

FINDINGS: The mediastinal contours are within normal limits. No cardiomegaly.
Atherosclerotic calcification of the aortic arch. Coronary stent in
place. Mild bibasilar hazy opacities, similar to comparison. No
significant pleural effusion, focal consolidation, or pneumothorax.
Enteric radiopaque material visualized near the splenic flexure. No
acute osseous abnormality.
IMPRESSION: Unchanged faint bibasilar hazy opacities, likely representing
resolving pneumonia. No new or worsening opacities.

Aortic Atherosclerosis ([XW]-[XW]).

## 2020-10-05 MED ORDER — SODIUM CHLORIDE 0.9 % IV SOLN
1.0000 g | Freq: Three times a day (TID) | INTRAVENOUS | Status: AC
  Administered 2020-10-05 – 2020-10-12 (×22): 1 g via INTRAVENOUS
  Filled 2020-10-05 (×24): qty 1

## 2020-10-05 MED ORDER — LACTATED RINGERS IV BOLUS
1000.0000 mL | Freq: Once | INTRAVENOUS | Status: AC
  Administered 2020-10-05: 1000 mL via INTRAVENOUS

## 2020-10-05 MED ORDER — ONDANSETRON HCL 4 MG/2ML IJ SOLN: 4.0000 mg | Freq: Four times a day (QID) | INTRAMUSCULAR | Status: DC | PRN

## 2020-10-05 NOTE — Progress Notes (Signed)
Patient alert and oriented x 4. Patient has had adequate urine output. Patient states that she has no pain. Patient blood pressure has been soft but maintaining 90 systolic. Nurse practitioner Sharion Settler notified, no new orders at this time. Patient has midodrine due this morning. Patient sitting up drinking thickened cranberry juice.

## 2020-10-05 NOTE — Progress Notes (Addendum)
NAME:  Kathryn Garrett, MRN:  [id number], DOB:  1945/11/12, LOS: 25 ADMISSION DATE:  09/10/2020, INITIAL CONSULTATION DATE:  09/11/2020 REFERRING MD: Dr. Damita Dunnings, CHIEF COMPLAINT: SOB  Brief Patient Description  [name] is a 3F with CAD status post RCA PCI in 2017 and in-stent restenosis requiring PCI, VF arrest,, chronic systolic diastolic heart failure, COPD, ongoing tobacco abuse, hypertension, hyperlipidemia, COPD, and narcotic abuse admitted with COPD exacerbation and Klebsiella pneumonia as well as acute on chronic systolic and diastolic heart failure.  Hospital course was complicated by cardiogenic shock requiring milrinone for diuresis and hypoxic respiratory failure requiring intubation. She was treated with Cefepime/Ancef for aspiration. She underwent left and right heart cath on 5/13 and was found to have mild to moderate nonobstructive CAD and normal filling pressures. She also had SVT that was managed with metoprolol. Patient was extubated and transferred to the floor. On 5/21 she was noted to be hypotensive despite IVFs and in sinus tachycardia to the 150s.  Lacate was 2.4.  Blood cultures with GNRs.  WBC was 14.8.  No significant infection in urine. CXR was unremarkable.  She was treated with Cefepime for Klebsiella pneumonia and transferred to ICU.  SHe was started on levophed for septic shock and PCCM re-consulted to assist with management.  Pertinent  Medical History  CAD status post RCA PCI in 2017 and in-stent restenosis requiring PCI,  VF arrest,,  chronic systolic diastolic heart failure,  COPD,  ongoing tobacco abuse,  hypertension,  hyperlipidemia,  Chronic narcotic abuse    Significant Hospital Events: Including procedures, antibiotic start and stop dates in addition to other pertinent events   09/10/2020-patient admits admitted with hospitalist service for COPD exacerbation and left lower lobe pneumonia 09/11/2020-overnight patient had suspected flash pulmonary  edema, dyspnea followed by somnolence requiring emergent intubation and mechanical ventilation.  Admitted to ICU 09/12/20- patient failed SBT , she had blood tinged secretions fromETT and aggitation, family was at bedside and we agreed on giving her more time and try again.  09/14/20- Failed SBT (became hypoxic with sats mid 80's, increased WOB and assessory muscle use, HR 140-160's). Will diurese today and add Metoprolol.  Tracheal aspirate from 4/30 with KLEBSIELLA PNEUMONIAE (resistant to Ampicillin)   09/15/20- Failed SBT (increased WOB and assessory muscle use, RR 40's, HR 140-150's, Hypertensive); plan to add scheduled PO Klonopin and Oxycodone, Lasix 40 mg IV x1, consult Palliative Care 09/16/20- Worsening Leukocytosis up to 21.7, low grade fever overnight, increased secretions overnight, will repeat Tracheal aspirate, remove central line, add mucinex, plan for SBT 09/17/20-[name] passed SBT in AM and following commands still having some moderate secretions 09/18/20-  Extubated yesterday.  Failed swallow eval today.  09/20/20- Weaned to room air, Leukocytosis improving; Hemodynamically stable 09/27/20- patient is coughing up phlegm but clinically is improved, we discussed pneumonia and COPD with outpatient clinic follow up with pulmonology.  There is plan for post dc rehab 09/28/20- patient is improved and she is being optimized for CIR, pulmonary will sign off and can follow up on outpatient basis 09/29/20- family at bedside today.  We discussed barium swallow with aspiration.  09/30/20- patient had PT today she walked today without need of additional supplemental O2 therapy.  She generally has been walking ok but has had severe LE weakness here.  She ate lunch infront of me had cough after couple bites.  We disucssed aspiartion risk and her barium swallow.  10/01/20: PCCM signed off  10/02/20: Pt with hypotension despite aggressive fluid resuscitation  concerning for sepsis secondary to UTI requiring levophed  gtt and transfer to stepdown unit.  PCCM signed back on case to assist with management  10/03/20: Remains on Levophed 10/04/20: Weaning down Levophed, increase Midodrine to 10 mg TID; consult ID for Bacteremia  10/05/20: Levophed weaned off, MRI spine yesterday negative for osteomyelitis discitis or septic arthritis, no epidural abscess or other fluid collection. Klebsiella is +ESBL.  ABX changed to Meropenem.  Cultures:  4/29: SARS-CoV-2 PCR>> negative 4/29:Influenza PCR>> negative 5/1: Strep pneumo urinary antigen>>negative 5/1: Legionella urinary antigen>>negative 5/5: Tracheal aspirate> candida albicans 5/21: Blood culture x2>>3 out 4 bottles growing GNR BCID positive for klebsiella pneumoniae (+ESBL) 5/21: UCx> negative  Antimicrobials:  Ceftriaxone 5/22>>5/24 Meropenem 5/24>> Cefepime 5/21 x 1 dose Metronidazole 5/21 x 1 dose  Interim History / Subjective:  -No acute events reported overnight -Pt denies all complaints, sitting in bed, eating breakfast -Levophed has been weaned off - MRI spine yesterday 10/04/20, negative for osteomyelitis discitis or septic arthritis, no epidural abscess or other fluid collection. -Afebrile, hemodynamically stable -WBC remains stable at 5.6 K ADDENDUM ~ pt developed fever 102.9 F, tachycardia, tachypnea (no escalation required in FiO2 requirements), BP remains stable, no vasopressors -Klebsiella comes back +ESBL ~ ABX changed to Meropenem (previously on Ceftriaxone) ABG: pH 7.48/pCO2 28/pO2 66/ Bicarbonate 20.9 CXR~ "Unchanged faint bibasilar hazy opacities, likely representing resolving pneumonia. No new or worsening opacities."   OBJECTIVE   Blood pressure (!) 117/55, pulse (!) 112, temperature 99 F (37.2 C), temperature source Oral, resp. rate 19, height 4\' 11"  (1.499 m), weight 54.6 kg, SpO2 93 %.        Intake/Output Summary (Last 24 hours) at 10/05/2020 0831 Last data filed at 10/05/2020 0500 Gross per 24 hour  Intake 127.6 ml   Output 400 ml  Net -272.4 ml   Filed Weights   10/03/20 0500 10/04/20 0437 10/05/20 0500  Weight: 54.6 kg 54.9 kg 54.6 kg   Physical Examination: GENERAL:Acutely ill appearing 75 year-old patient, sitting in bed, no acute distress.  EYES: Pupils PERRLA. No scleral icterus. Extraocular muscles intact.  HEENT: Head atraumatic, normocephalic. Oropharynx and nasopharynx clear.  NECK:  Supple, no jugular venous distention. No thyroid enlargement, no tenderness.  LUNGS: Clear breath sounds bilaterally, no wheezing, rales,rhonchi or crepitation. No use of accessory muscles of respiration.  CARDIOVASCULAR: Tachycardiac, regular rhythm, S1, S2. No murmurs, rubs, or gallops. 2+ distal pulses ABDOMEN: Soft, nontender, nondistended. Bowel sounds present. No organomegaly or mass.  EXTREMITIES: No pedal edema, cyanosis, or clubbing.  NEUROLOGIC: Cranial nerves II through XII are intact. Muscle strength 5/5 in all extremities. Sensation intact. Gait not checked.  PSYCHIATRIC: The patient is alert and oriented x 4.  SKIN: Warm and dry. No obvious rash, lesion, or ulcer.   Labs/imaging that I havepersonally reviewed  (right click and "Reselect all SmartList Selections" daily)   Labs 10/04/2020: WBC 5.5, Hgb 10.4, glucose 123, BUN 8, Cr. 0.44 CXR 05/21: No active disease  MRI Spine (cervical, thoracic, lumbar) 10/03/20>>1. No MRI evidence for osteomyelitis discitis or septic arthritis within the cervical, thoracic, or lumbar spine. No epidural abscess or other collection. 2. Mild edema and enhancement within the lower posterior paraspinous musculature, nonspecific, but could reflect changes of mild muscular injury and/or strain. Possible acute myositis could also have this appearance. No discrete collections. 3. Mild multilevel cervical spondylosis with resultant mild spinal stenosis at C3-4 through C5-6. 4. Degenerative spondylosis at T6-7 and T7-8 with associated cord flattening, but no  significant spinal  stenosis. 5. Mild for age multilevel lumbar spondylosis, most pronounced at L4-5 where there is resultant mild-to-moderate bilateral lateral recess stenosis. No frank impingement. 6. Small layering bilateral pleural effusions, left greater than right.  Labs   CBC: Recent Labs  Lab 09/30/20 0430 10/02/20 1823 10/03/20 0822 10/04/20 0407 10/05/20 0325  WBC 8.4 14.8* 8.6 5.5 5.6  NEUTROABS 4.6 12.9* 6.4 3.4 4.0  HGB 10.9* 12.2 11.6* 10.4* 10.0*  HCT 34.8* 37.0 35.8* 32.5* 30.7*  MCV 94.6 92.0 93.5 94.5 93.6  PLT 351 297 305 270 093    Basic Metabolic Panel: Recent Labs  Lab 10/02/20 0508 10/02/20 1736 10/03/20 0822 10/04/20 0407 10/05/20 0325  NA 138 134* 138 141 134*  K 3.3* 3.9 3.5 3.5 4.1  CL 104 99 105 108 101  CO2 27 26 25 26 26   GLUCOSE 110* 142* 136* 123* 101*  BUN 10 12 10 8 10   CREATININE 0.49 0.58 0.44 0.44 0.49  CALCIUM 8.5* 8.5* 8.4* 8.4* 8.2*  MG  --   --  1.7 1.8 2.1  PHOS  --   --  3.3 2.9 2.5   GFR: Estimated Creatinine Clearance: 45.8 mL/min (by C-G formula based on SCr of 0.49 mg/dL). Recent Labs  Lab 10/02/20 1736 10/02/20 1823 10/02/20 1824 10/02/20 2130 10/03/20 0822 10/04/20 0407 10/05/20 0325  PROCALCITON 1.93  --   --   --   --   --   --   WBC  --  14.8*  --   --  8.6 5.5 5.6  LATICACIDVEN  --   --  2.4* 1.1  --   --   --     Liver Function Tests: Recent Labs  Lab 10/02/20 1736 10/03/20 0822  AST 27  --   ALT 28  --   ALKPHOS 77  --   BILITOT 0.6  --   PROT 6.4*  --   ALBUMIN 2.8* 2.5*   No results for input(s): LIPASE, AMYLASE in the last 168 hours. No results for input(s): AMMONIA in the last 168 hours.  ABG    Component Value Date/Time   PHART 7.38 09/15/2020 0500   PCO2ART 41 09/15/2020 0500   PO2ART 123 (H) 09/15/2020 0500   HCO3 33.5 (H) 09/17/2020 0944   TCO2 23 01/14/2009 0327   ACIDBASEDEF 0.8 09/15/2020 0500   O2SAT 88.8 09/17/2020 0944     Coagulation Profile: Recent Labs  Lab  10/02/20 1736  INR 1.4*    Cardiac Enzymes: No results for input(s): CKTOTAL, CKMB, CKMBINDEX, TROPONINI in the last 168 hours.  HbA1C: Hgb A1c MFr Bld  Date/Time Value Ref Range Status  09/11/2020 04:37 AM 6.4 (H) 4.8 - 5.6 % Final    Comment:    (NOTE) Pre diabetes:          5.7%-6.4%  Diabetes:              >6.4%  Glycemic control for   <7.0% adults with diabetes   10/02/2019 12:28 PM 6.4 (H) 4.8 - 5.6 % Final    Comment:             Prediabetes: 5.7 - 6.4          Diabetes: >6.4          Glycemic control for adults with diabetes: <7.0     CBG: Recent Labs  Lab 10/04/20 1549 10/04/20 1921 10/04/20 2324 10/05/20 0407 10/05/20 0807  GLUCAP 130* 117* 105* 106* 100*    Allergies Allergies  Allergen Reactions  . Azithromycin Rash     Home Medications  Prior to Admission medications   Medication Sig Start Date End Date Taking? Authorizing Provider  acetaminophen (TYLENOL) 500 MG tablet Take 1,000 mg by mouth every 6 (six) hours as needed for headache.   Yes [provider]  albuterol (PROVENTIL HFA;VENTOLIN HFA) 108 (90 Base) MCG/ACT inhaler Inhale 2 puffs into the lungs every 4 (four) hours as needed for wheezing or shortness of breath.   Yes [provider]  amoxicillin (AMOXIL) 500 MG tablet Take 500 mg by mouth 3 (three) times daily. 08/03/20  Yes [provider]  aspirin EC 81 MG tablet Take 81 mg by mouth at bedtime.    Yes [provider]  azelastine (ASTELIN) 0.1 % nasal spray USE 2 SPRAYS BID UNTIL DIRECTED TO STOP. USE FOR RUNNY NOSE 08/14/17  Yes [provider]  Buprenorphine HCl-Naloxone HCl 8-2 MG FILM Place 0.5 Film under the tongue daily.   Yes [provider]  cetirizine (ZYRTEC) 10 MG tablet Take 1 tablet (10 mg total) by mouth daily. 03/11/18  Yes Alene Mires, Sahar M, PA-C  clopidogrel (PLAVIX) 75 MG tablet TAKE 1 TABLET(75 MG) BY MOUTH AT BEDTIME 09/16/20  Yes Jettie Booze, MD  dexamethasone  (DECADRON) 6 MG tablet dexamethasone 6 mg tablet  TAKE 1 TABLET BY MOUTH EVERY DAY   Yes [provider]  doxycycline (VIBRAMYCIN) 100 MG capsule doxycycline hyclate 100 mg capsule  TAKE 1 CAPSULE BY MOUTH TWICE DAILY   Yes [provider]  fluticasone (FLONASE) 50 MCG/ACT nasal spray Place 1 spray into both nostrils daily.   Yes [provider]  furosemide (LASIX) 40 MG tablet Take 1 tablet (40 mg total) by mouth daily as needed (swelling). 01/16/20  Yes Jettie Booze, MD  ibuprofen (ADVIL,MOTRIN) 600 MG tablet Take 600 mg by mouth as needed for pain. 08/14/17  Yes [provider]  isosorbide mononitrate (IMDUR) 30 MG 24 hr tablet TAKE 1 TABLET(30 MG) BY MOUTH DAILY 09/16/20  Yes Jettie Booze, MD  levothyroxine (SYNTHROID) 88 MCG tablet Take 88 mcg by mouth every morning. 05/26/20  Yes [provider]  metoprolol tartrate (LOPRESSOR) 25 MG tablet TAKE 1 TABLET BY MOUTH TWICE DAILY 09/16/20  Yes Jettie Booze, MD  mupirocin ointment (BACTROBAN) 2 % mupirocin 2 % topical ointment  APPLY TOPICALLY TO LOWER LEG THREE TIMES DAILY FOR 10 DAYS   Yes [provider]  nystatin cream (MYCOSTATIN) Apply topically 2 (two) times daily. 08/11/20  Yes [provider]  potassium chloride SA (KLOR-CON) 20 MEQ tablet Take 1 tablet (20 mEq total) by mouth daily as needed (Take with furosemide (lasix)). 01/16/20  Yes Jettie Booze, MD  predniSONE (DELTASONE) 20 MG tablet prednisone 20 mg tablet   Yes [provider]  rosuvastatin (CRESTOR) 20 MG tablet TAKE 1 TABLET(20 MG) BY MOUTH DAILY 09/16/20  Yes Jettie Booze, MD  TRELEGY ELLIPTA 100-62.5-25 MCG/INH AEPB Take 1 puff by mouth daily. 08/13/20  Yes [provider]  triamcinolone cream (KENALOG) 0.1 % Apply topically 2 (two) times daily. to affected area 08/11/20  Yes [provider]  metoprolol succinate (TOPROL-XL) 50 MG 24 hr tablet Take 1 tablet (50 mg  total) by mouth daily. Take with or immediately following a meal. 09/29/20   Domenic Polite, MD  nitroGLYCERIN (NITROSTAT) 0.4 MG SL tablet Place 1 tablet (0.4 mg total) under the tongue every 5 (five) minutes as needed for  chest pain. 03/12/18   Jettie Booze, MD  pantoprazole (PROTONIX) 40 MG tablet Take 1 tablet (40 mg total) by mouth daily. 09/29/20   Domenic Polite, MD  sacubitril-valsartan (ENTRESTO) 24-26 MG Take 1 tablet by mouth 2 (two) times daily. 09/28/20   Domenic Polite, MD   Scheduled Meds: . aspirin  81 mg Oral QHS  . buprenorphine-naloxone  2 tablet Sublingual Daily  . Chlorhexidine Gluconate Cloth  6 each Topical QHS  . clopidogrel  75 mg Oral QHS  . fluticasone  1 spray Each Nare Daily  . insulin aspart  0-15 Units Subcutaneous Q4H  . levothyroxine  88 mcg Oral Q0600  . loratadine  10 mg Oral Daily  . mouth rinse  15 mL Mouth Rinse BID  . metoprolol succinate  50 mg Oral Daily  . midodrine  10 mg Oral TID WC  . mometasone-formoterol  2 puff Inhalation BID  . multivitamin with minerals  1 tablet Oral Daily  . pantoprazole  40 mg Oral Daily  . rosuvastatin  40 mg Oral QHS  . sodium chloride flush  3 mL Intravenous Q12H  . sodium chloride flush  3 mL Intravenous Q12H   Continuous Infusions: . sodium chloride Stopped (09/24/20 1321)  . sodium chloride 250 mL (10/04/20 0653)  . cefTRIAXone (ROCEPHIN)  IV 2 g (10/04/20 0850)  . norepinephrine (LEVOPHED) Adult infusion Stopped (10/04/20 1014)   PRN Meds:.acetaminophen **OR** acetaminophen, albuterol, artificial tears, docusate, guaiFENesin, polyethylene glycol  Resolved Hospital Problem list     ASSESSMENT & PLAN   Septic shock ~ resolved Acute systolic congestive heart failure Acute non-ST elevation MI s/p cardiac cath 05/13 recommendation medical management  New diagnosis of A. fib with RVR ~ currently Sinus tachycardia -Continuous cardiac monitoring -Maintain MAP >65 -Vasopressors as needed to maintain  MAP goal -Continue midodrine to 10 mg TID -Patient's ejection fraction on echocardiogram is 30% -Diuresis as renal function and BP permits >> currently on hold due to vasopressors -Cardiology following, input is appreciated -Hold entresto and immediate release metoprolol due to hypotension  -Continue aspirin, plavix, and statin -Off Amiodarone   Severe sepsis due to [place] (+ESBL), currently unclear etiology Previous Klebsiella Pneumonia ~ treated (Completed 7 days of Cefepime/Ancef) -Monitor fever curve -Trend WBC's -Follow cultures as above -Blood cultures on 10/02/20 with klebsiella pneumoniae & Enterobacteriaceae  -ABX changed to Meropenem 10/05/20 -Urinalysis and urine culture on 5/21 negative (? Could be impacted by previous exposure to antibiotics) -CXR on 5/24 without new/worsening opacities, shows unchanged faint bibasilar opacities consistent with resolving pneumonia -MRI spine 10/04/20 negative for osteomyelitis discitis or septic arthritis within the cervical, thoracic, or lumbar spine. No epidural abscess or other collection. -Consider TEE -Consult ID, appreciate input ~ will follow recommendations  Acute hypoxic respiratory failure secondary to Acute COPD Exacerbation & left lung multifocal pneumonia -S/p Extubation 5/7 -Supplemental O2 as needed to maintain O2 sats 88 to 94% -Follow intermittent CXR & ABG as needed -Completed 7 day course of ABX for Klebsiella Pneumonia -Bronchodilators as needed -Continue Dulera -Aggressive pulmonary toilet -PT/OT  Acute metabolic/toxic encephalopathy ~ resolved  -Continue outpatient Suboxone due to narcotic abuse hx        Best practice (right click and "Reselect all SmartList Selections" daily)   Diet: DYS 3 Pain/Anxiety/Delirium protocol: not indicated  VAP protocol: not indicated  DVT prophylaxis: SCD and subq lovenox  GI prophylaxis: PPI Glucose control:  SSI Yes Central venous access:  n/a Arterial line:  N/A Foley: N/A  Mobility: Out of bed with assistance as tolerated PT consulted: needs Pt/OT Code Status:  full code Disposition: Stepdown  Critical care time: 38 minutes    Darel Hong, AGACNP-BC [facility] Prefer epic messenger for cross cover needs If after hours, please call E-link

## 2020-10-05 NOTE — Progress Notes (Signed)
PT Cancellation Note  Patient Details Name: CLARICE ZULAUF MRN: [id number] DOB: [birth year]   Cancelled Treatment:    Reason Eval/Treat Not Completed: Medical issues which prohibited therapy. Chart reviewed. Noted patient with tachycardia, tachypnea, fever with the most current blood pressure of 87/45.  PT will hold given these acute medical issues and follow up as appropriate.   Minna Merritts, PT, MPT  .Percell Locus 10/05/2020, 1:46 PM

## 2020-10-05 NOTE — Consult Note (Signed)
Kathryn Garrett for Infectious Disease       Reason for Consult: bacteremia    Referring Physician: Dr. Priscella Mann  Principal Problem:   Acute exacerbation of chronic obstructive pulmonary disease (COPD) ([place]) Active Problems:   CAP (community acquired pneumonia)   CAD in native artery   HTN (hypertension)   Tobacco abuse   Hyperlipidemia LDL goal <70   Severe sepsis (HCC)   NSTEMI (non-ST elevated myocardial infarction) ([place])   Hyperglycemia   SOB (shortness of breath)   Acute respiratory failure (HCC)   HFrEF (heart failure with reduced ejection fraction) (HCC)   Acute on chronic HFrEF (heart failure with reduced ejection fraction) (HCC)   SVT (supraventricular tachycardia) (HCC)   Acute combined systolic and diastolic heart failure ([place])   Pure hypercholesterolemia   . aspirin  81 mg Oral QHS  . buprenorphine-naloxone  2 tablet Sublingual Daily  . Chlorhexidine Gluconate Cloth  6 each Topical QHS  . clopidogrel  75 mg Oral QHS  . fluticasone  1 spray Each Nare Daily  . insulin aspart  0-15 Units Subcutaneous Q4H  . levothyroxine  88 mcg Oral Q0600  . loratadine  10 mg Oral Daily  . mouth rinse  15 mL Mouth Rinse BID  . metoprolol succinate  50 mg Oral Daily  . midodrine  10 mg Oral TID WC  . mometasone-formoterol  2 puff Inhalation BID  . multivitamin with minerals  1 tablet Oral Daily  . pantoprazole  40 mg Oral Daily  . rosuvastatin  40 mg Oral QHS  . sodium chloride flush  3 mL Intravenous Q12H  . sodium chloride flush  3 mL Intravenous Q12H    Recommendations:  continue meropenem 7 days  Assessment: She has a new bacteremia with ESBL Klebsiella of unknown etiology during a prolonged hospitalization.  No urinary, GI or new pulmonary symptoms, which are the typical sources.  No indication to repeat blood cultures.  Would treat for 7 days with meropenem.  Not typical for endocarditis so TEE not indicated.    Antibiotics: Day 1 meropenem  HPI: Kathryn Garrett is a 75 y.o. female with a history of CAD, history of PCI in 2017 and stent placement, vfib arrest, chronic systolic heart failure, COPD, HTN, hyperlipidemia admitted initially on 09/11/20 with acute respiratory failure due to left lung pneumonia and required intubation due to acute flash pulmonary edema.  She was extubated 5/7 and completed 7 days of antibiotics.  Trach aspirate at that time had grown Klebsiella pneumoniae resistant only to ampicillin.  She remained inpatient with inability to get to SNF since it was denied by her insurance.  On 5/21 she developed a fever to 101.2 and leukocytosis to 14.8 and blood cultures sent and are positive for ESBL Klebsiella.  UA not consistent with infection. Lactate elevated to 2.4 and she was initially started on cefepime and metronidazole > ceftriaxone and now meropenem with the blood culture results.  She has required some pressor support and currently on norepinephrine.  Tmax over last 24 hours of 102.9.  No associated diarrhea, no dysuria.     Review of Systems:  Constitutional: positive for fevers and chills or negative for anorexia Respiratory: negative for cough or sputum Gastrointestinal: negative for nausea and diarrhea Integument/breast: negative for rash All other systems reviewed and are negative    Past Medical History:  Diagnosis Date  . Anemia, mild   . Arthritis    "qwhere" (02/10/2016)  . CAD (coronary artery  disease)    a. VF arrest 01/2009/CAD with inferoposterior MI s/p aspiration thrombectomy/BMS of RCA at that time. b. ISR of BMS s/p DES to RCA 06/2010 with moderate LM/LAD disease not significant by FFR. c. s/p angiosculpt PTCA to prox RCA 01/2016 for ISR. d. Aggressive PTCA to prox RCA for ISR 05/2016.  . Cardiac arrest ([place]) 01/2009   a. in setting of inf-post STEMI 01/2009 (VF).  . Chest pain 06/04/2016  . Chronic bronchitis ([place])   . Chronic combined systolic and diastolic CHF (congestive heart failure) ([place])    a. EF  40-45% by cath 2010. b. 60-65% with grade 1 DD by echo 09/2015  . COPD (chronic obstructive pulmonary disease) ([place])   . Hyperlipidemia 01/28/2016  . Hyperlipidemia LDL goal <70 01/28/2016  . Hypertension   . Lung mass   . MI (myocardial infarction) ([place]) 01/2009  . Narcotic abuse ([place])    pt now taking Suboxone tid  . Pleural effusion on left 09/24/2015  . Pneumonia 06/2014; 09/2015  . Pre-diabetes   . Pulmonary nodule   . PVD (peripheral vascular disease) (HCC)    mild atherosclerosis of infrarenal aorta, 25% ostial left renal artery stenosis, 50% ostial right common iliac by cath 2010  . S/P PTCA (percutaneous transluminal coronary angioplasty) 02/10/16 to RCA lesion for in stent restenois 02/11/2016  . Subclavian artery stenosis (HCC)    a. >50% by duplex 05/2016.  . Tobacco abuse   . Unstable angina [facility])     Social History   Tobacco Use  . Smoking status: Current Every Day Smoker    Packs/day: 0.50    Years: 56.00    Pack years: 28.00    Types: Cigarettes  . Smokeless tobacco: Never Used  Vaping Use  . Vaping Use: Never used  Substance Use Topics  . Alcohol use: No    Alcohol/week: 0.0 standard drinks  . Drug use: No    Family History  Problem Relation Age of Onset  . Coronary artery disease Father   . Hyperlipidemia Father   . Early death Father   . Heart attack Father   . Coronary artery disease Mother   . Hyperlipidemia Mother   . Heart attack Mother   . Cancer Brother     Allergies  Allergen Reactions  . Azithromycin Rash    Physical Exam: Constitutional: in no apparent distress  Vitals:   10/05/20 1545 10/05/20 1556  BP: (!) 83/55 (!) 98/50  Pulse: (!) 101   Resp: (!) 21   Temp:    SpO2: 95%    EYES: anicteric Cardiovascular: Cor RRR Respiratory: clear; GI: Bowel sounds are normal, liver is not enlarged, spleen is not enlarged Musculoskeletal: no pedal edema noted Skin: negatives: no rash Neuro: non-focal  Lab Results  Component Value Date    WBC 5.6 10/05/2020   HGB 10.0 (L) 10/05/2020   HCT 30.7 (L) 10/05/2020   MCV 93.6 10/05/2020   PLT 211 10/05/2020    Lab Results  Component Value Date   CREATININE 0.49 10/05/2020   BUN 10 10/05/2020   NA 134 (L) 10/05/2020   K 4.1 10/05/2020   CL 101 10/05/2020   CO2 26 10/05/2020    Lab Results  Component Value Date   ALT 28 10/02/2020   AST 27 10/02/2020   ALKPHOS 77 10/02/2020     Microbiology: Recent Results (from the past 240 hour(s))  Culture, blood (x 2)     Status: Abnormal   Collection Time: 10/02/20  6:24 PM   Specimen: BLOOD  Result Value Ref Range Status   Specimen Description   Final    BLOOD BLOOD RIGHT HAND Performed at [facility], [address] [place], [place] [address]    Special Requests   Final    BOTTLES DRAWN AEROBIC AND ANAEROBIC Blood Culture adequate volume Performed at [facility], [address] [place], [place] [address]    Culture  Setup Time   Final    GRAM NEGATIVE RODS IN BOTH AEROBIC AND ANAEROBIC BOTTLES CRITICAL RESULT CALLED TO, READ BACK BY AND VERIFIED WITH: NATHAN BELUE AT 7893 ON 10/03/2020 [place]. Performed at [facility], [place] [address], [place],  [address]    Culture (A)  Final    KLEBSIELLA PNEUMONIAE Confirmed Extended Spectrum Beta-Lactamase Producer (ESBL).  In bloodstream infections from ESBL organisms, carbapenems are preferred over piperacillin/tazobactam. They are shown to have a lower risk of mortality.    Report Status 10/05/2020 FINAL  Final   Organism ID, Bacteria KLEBSIELLA PNEUMONIAE  Final      Susceptibility   Klebsiella pneumoniae - MIC*    AMPICILLIN >=32 RESISTANT Resistant     CEFAZOLIN >=64 RESISTANT Resistant     CEFEPIME 8 INTERMEDIATE Intermediate     CEFTAZIDIME >=64 RESISTANT Resistant     CEFTRIAXONE >=64 RESISTANT Resistant     CIPROFLOXACIN <=0.25 SENSITIVE Sensitive     GENTAMICIN <=1 SENSITIVE Sensitive     IMIPENEM <=0.25 SENSITIVE Sensitive      TRIMETH/SULFA <=20 SENSITIVE Sensitive     AMPICILLIN/SULBACTAM >=32 RESISTANT Resistant     PIP/TAZO >=128 RESISTANT Resistant     * KLEBSIELLA PNEUMONIAE  Blood Culture ID Panel (Reflexed)     Status: Abnormal   Collection Time: 10/02/20  6:24 PM  Result Value Ref Range Status   Enterococcus faecalis NOT DETECTED NOT DETECTED Final   Enterococcus Faecium NOT DETECTED NOT DETECTED Final   Listeria monocytogenes NOT DETECTED NOT DETECTED Final   Staphylococcus species NOT DETECTED NOT DETECTED Final   Staphylococcus aureus (BCID) NOT DETECTED NOT DETECTED Final   Staphylococcus epidermidis NOT DETECTED NOT DETECTED Final   Staphylococcus lugdunensis NOT DETECTED NOT DETECTED Final   Streptococcus species NOT DETECTED NOT DETECTED Final   Streptococcus agalactiae NOT DETECTED NOT DETECTED Final   Streptococcus pneumoniae NOT DETECTED NOT DETECTED Final   Streptococcus pyogenes NOT DETECTED NOT DETECTED Final   A.calcoaceticus-baumannii NOT DETECTED NOT DETECTED Final   Bacteroides fragilis NOT DETECTED NOT DETECTED Final   Enterobacterales DETECTED (A) NOT DETECTED Final    Comment: Enterobacterales represent a large order of gram negative bacteria, not a single organism. CRITICAL RESULT CALLED TO, READ BACK BY AND VERIFIED WITH: NATHAN BELUE AT 0708 ON 10/03/2020 [place].    Enterobacter cloacae complex NOT DETECTED NOT DETECTED Final   Escherichia coli NOT DETECTED NOT DETECTED Final   Klebsiella aerogenes NOT DETECTED NOT DETECTED Final   Klebsiella oxytoca NOT DETECTED NOT DETECTED Final   Klebsiella pneumoniae DETECTED (A) NOT DETECTED Corrected    Comment: CORRECTED ON 05/22 AT 0825: PREVIOUSLY REPORTED AS DETECTED CRITICAL RESULT CALLED TO, READ BACK BY AND VERIFIED WITH: NATHAN BELUE AT 0708 ON 10/03/2020 [place].   Proteus species NOT DETECTED NOT DETECTED Final   Salmonella species NOT DETECTED NOT DETECTED Final   Serratia marcescens NOT DETECTED NOT DETECTED Final   Haemophilus  influenzae NOT DETECTED NOT DETECTED Final   Neisseria meningitidis NOT DETECTED NOT DETECTED Final   Pseudomonas aeruginosa NOT  DETECTED NOT DETECTED Final   Stenotrophomonas maltophilia NOT DETECTED NOT DETECTED Final   Candida albicans NOT DETECTED NOT DETECTED Final   Candida auris NOT DETECTED NOT DETECTED Final   Candida glabrata NOT DETECTED NOT DETECTED Final   Candida krusei NOT DETECTED NOT DETECTED Final   Candida parapsilosis NOT DETECTED NOT DETECTED Final   Candida tropicalis NOT DETECTED NOT DETECTED Final   Cryptococcus neoformans/gattii NOT DETECTED NOT DETECTED Final   CTX-M ESBL NOT DETECTED NOT DETECTED Final   Carbapenem resistance IMP NOT DETECTED NOT DETECTED Final   Carbapenem resistance KPC NOT DETECTED NOT DETECTED Final   Carbapenem resistance NDM NOT DETECTED NOT DETECTED Final   Carbapenem resist OXA 48 LIKE NOT DETECTED NOT DETECTED Final   Carbapenem resistance VIM NOT DETECTED NOT DETECTED Final    Comment: Performed at [facility], [address] [place], [place] [address]  Culture, blood (x 2)     Status: Abnormal   Collection Time: 10/02/20  6:32 PM   Specimen: BLOOD  Result Value Ref Range Status   Specimen Description   Final    BLOOD RIGHT ANTECUBITAL Performed at [facility], [address] [place], [place] [address]    Special Requests   Final    BOTTLES DRAWN AEROBIC AND ANAEROBIC Blood Culture results may not be optimal due to an excessive volume of blood received in culture bottles Performed at [facility], [address] [place], [place] [address]    Culture  Setup Time   Final    GRAM NEGATIVE RODS IN BOTH AEROBIC AND ANAEROBIC BOTTLES CRITICAL VALUE NOTED.  VALUE IS CONSISTENT WITH PREVIOUSLY REPORTED AND CALLED VALUE. Performed at [facility], [address] [place], [place] [address]    Culture (A)  Final    KLEBSIELLA PNEUMONIAE SUSCEPTIBILITIES PERFORMED ON PREVIOUS CULTURE WITHIN  THE LAST 5 DAYS. Performed at [facility], [place] [address], [place], [place] [address]    Report Status 10/05/2020 FINAL  Final  Culture, Urine     Status: None   Collection Time: 10/03/20  8:52 AM   Specimen: Urine, Clean Catch  Result Value Ref Range Status   Specimen Description   Final    URINE, CLEAN CATCH Performed at [facility], [address], [place], [place] [address]    Special Requests   Final    NONE Performed at [facility], [address], [place], [place] [address]    Culture   Final    NO GROWTH Performed at [facility], [place] [address] [place],  [address]    Report Status 10/04/2020 FINAL  Final    [place] Headings, [place] for Infectious Disease [facility] [email or web address] 10/05/2020, 4:30 PM

## 2020-10-05 NOTE — Progress Notes (Addendum)
Daily Progress Note   Patient Name: Kathryn Garrett       Date: 10/05/2020 DOB: 09-25-1945  Age: 75 y.o. MRN#: [id number] Attending Physician: Sidney Ace, MD Primary Care Physician: Imagene Riches, NP Admit Date: 09/10/2020  Reason for Consultation/Follow-up: Establishing goals of care  Subjective: Requested to re-engage by primary team as patient is now in ICU. Updated by CCM. ID just finished speaking with patient. 2 daughters present. Asked daughters to step out per previous patient request, at which point they asked why they could not be present and voiced that they wanted to stay. Explained that I cannot share our conversations as per her request. They stepped out. Upon closing door and speaking with patient, she immediately looked around the room and says "where are my girls"? Explained that they were not present and she said "good". She states she feels she has been doing better. She understands she is in ICU. She states she has not made decisions about care she would not want. I asked if her daughters or other family members could be included in the conversation at this point and she said "no". Explained that if she becomes unable to make decisions that if she does not have a surrogate decision maker, her daughters and son will be her surrogate decision maker. She voices understanding. Stepped out and family reentered soon after.     Note locked today at patient request and as per previous directives during some conversations.   Length of Stay: 25  Current Medications: Scheduled Meds:  . aspirin  81 mg Oral QHS  . buprenorphine-naloxone  2 tablet Sublingual Daily  . Chlorhexidine Gluconate Cloth  6 each Topical QHS  . clopidogrel  75 mg Oral QHS  . fluticasone  1 spray Each  Nare Daily  . insulin aspart  0-15 Units Subcutaneous Q4H  . levothyroxine  88 mcg Oral Q0600  . loratadine  10 mg Oral Daily  . mouth rinse  15 mL Mouth Rinse BID  . metoprolol succinate  50 mg Oral Daily  . midodrine  10 mg Oral TID WC  . mometasone-formoterol  2 puff Inhalation BID  . multivitamin with minerals  1 tablet Oral Daily  . pantoprazole  40 mg Oral Daily  . rosuvastatin  40 mg Oral QHS  . sodium chloride  flush  3 mL Intravenous Q12H  . sodium chloride flush  3 mL Intravenous Q12H    Continuous Infusions: . sodium chloride Stopped (09/24/20 1321)  . sodium chloride 5 mL/hr at 10/05/20 1500  . meropenem (MERREM) IV Stopped (10/05/20 1205)  . norepinephrine (LEVOPHED) Adult infusion 3 mcg/min (10/05/20 1500)    PRN Meds: acetaminophen **OR** acetaminophen, albuterol, artificial tears, docusate, guaiFENesin, ondansetron (ZOFRAN) IV, polyethylene glycol  Physical Exam Pulmonary:     Effort: Pulmonary effort is normal.  Neurological:     Mental Status: She is alert.             Vital Signs: BP (!) 98/50   Pulse (!) 101   Temp 98.6 F (37 C) (Oral)   Resp (!) 21   Ht 4\' 11"  (1.499 m)   Wt 54.6 kg   SpO2 95%   BMI 24.31 kg/m  SpO2: SpO2: 95 % O2 Device: O2 Device: Nasal Cannula O2 Flow Rate: O2 Flow Rate (L/min): 2 L/min  Intake/output summary:   Intake/Output Summary (Last 24 hours) at 10/05/2020 1609 Last data filed at 10/05/2020 1500 Gross per 24 hour  Intake 1616.97 ml  Output 400 ml  Net 1216.97 ml   LBM: Last BM Date: 10/05/20 Baseline Weight: Weight: 68.2 kg Most recent weight: Weight: 54.6 kg         Patient Active Problem List   Diagnosis Date Noted  . SVT (supraventricular tachycardia) ([place])   . Acute combined systolic and diastolic heart failure ([place])   . Pure hypercholesterolemia   . Acute on chronic HFrEF (heart failure with reduced ejection fraction) ([place])   . Acute respiratory failure ([place]) 09/11/2020  . HFrEF (heart failure  with reduced ejection fraction) ([place])   . Acute exacerbation of chronic obstructive pulmonary disease (COPD) ([place]) 09/10/2020  . Severe sepsis ([place]) 09/10/2020  . NSTEMI (non-ST elevated myocardial infarction) ([place]) 09/10/2020  . Hyperglycemia 09/10/2020  . SOB (shortness of breath) 09/10/2020  . Chronic obstructive pulmonary disease ([place]) 03/05/2018  . History of opioid abuse ([place]) 03/05/2018  . Nocturnal hypoxemia 10/30/2017  . Peripheral arterial disease ([place]) 08/30/2016  . Chest pain 06/04/2016  . S/P PTCA (percutaneous transluminal coronary angioplasty) 02/10/16 to RCA lesion for in stent restenois 02/11/2016  . Unstable angina ([place])   . Hyperlipidemia LDL goal <70 01/28/2016  . Presence of coronary angioplasty implant and graft 10/15/2015  . Tobacco abuse 10/15/2015  . CAP (community acquired pneumonia) 09/24/2015  . CAD in native artery 09/24/2015  . HTN (hypertension) 09/24/2015  . Lung nodule, solitary 03/31/2014  . Liver nodule 03/31/2014    Palliative Care Assessment & Plan    Recommendations/Plan:  Full code full scope. Patient maintains she does not want me to speak with any of her family. She is aware if she becomes unable to make decisions, her children will be her surrogate decision makers.     Code Status:    Code Status Orders  (From admission, onward)         Start     Ordered   09/11/20 0044  Full code  Continuous        09/11/20 0044        Code Status History    Date Active Date Inactive Code Status Order ID Comments User Context   09/10/2020 1615 09/11/2020 0044 Full Code [id number]  Rhetta Mura, DO ED   06/04/2016 1751 06/06/2016 1520 Full Code [id number]  Cheryln Manly, NP ED   02/10/2016 667 [id number] 1253 02/11/2016  [place] Full Code [id number]  Jettie Booze, MD Inpatient   09/24/2015 2245 10/01/2015 1719 Full Code [id number]  Lance Coon, MD ED   Advance Care Planning Activity       Prognosis:   Poor    Care plan was discussed with  CCM  Thank you for allowing the Palliative Medicine Team to assist in the care of this patient.   Total Time 15 min Prolonged Time Billed no      Greater than 50%  of this time was spent counseling and coordinating care related to the above assessment and plan.  Asencion Gowda, NP  Please contact Palliative Medicine Team phone at [phone] for questions and concerns.

## 2020-10-05 NOTE — Progress Notes (Signed)
PROGRESS NOTE    Kathryn Garrett  XNT:[id number] DOB: 02-10-1946 DOA: 09/10/2020 PCP: Imagene Riches, NP   Brief Narrative:  74/F presented to the [facility] ED from home with complaints of shortness of breath -Admitted on 4/29 with hypoxic respiratory failure secondary to COPD exacerbation and community-acquired pneumonia [facility] course was complicated by flash pulmonary edema and NSTEMI requiring mechanical ventilation, intubated on 4/30  Post extubation patient continued to slowly improve.  Initially was slated for CIR however unfortunately insurance denied.  Currently bed search initiated for skilled nursing facility.  Patient remains medically stable for discharge.  Received notification of case management that SNF had been denied by patient's insurance.  Appeal currently in progress.  5/22: Patient had acute onset of tachyarrhythmia yesterday.  Responded to bedside.  Ventricular rates approximately 60.  Unclear etiology as morphology.  Sinus.  Initially started amiodarone infusion however patient then dropped her blood pressure and became febrile.  Was transferred to the ICU overnight due to declining blood pressures despite aggressive fluid resuscitation.  Patient now in ICU.  Evidence of septic shock secondary to Klebsiella bacteremia.  On IV cefepime.  Mentating clearly.  Unclear source of sepsis.   5/23: Dynamics improving.  The exact etiology of the Klebsiella bacteremia is unclear.  Urinalysis did not grow any bacteria.  Case has been discussed with PCCM.  The Klebsiella pneumonia is the same bacteria that was found on tracheal aspirate when patient was intubated.  This raises possibility of another infectious focus somewhere in the body.  Considering TEE and will pursue MRI total spine to rule out spinal abscess.  5/24: Notified by pharmacy that Klebsiella isolated from the blood is demonstrating resistance gene.  Antibiotic switched to meropenem.  Before new antibiotic to be started  patient's clinical condition deteriorated.  She became acutely tachycardic, febrile, tachypneic.  PCCM reengaged for recommendations.  Assessment & Plan:   Principal Problem:   Acute exacerbation of chronic obstructive pulmonary disease (COPD) (HCC) Active Problems:   CAP (community acquired pneumonia)   CAD in native artery   HTN (hypertension)   Tobacco abuse   Hyperlipidemia LDL goal <70   Severe sepsis (HCC)   NSTEMI (non-ST elevated myocardial infarction) (HCC)   Hyperglycemia   SOB (shortness of breath)   Acute respiratory failure (HCC)   HFrEF (heart failure with reduced ejection fraction) (HCC)   Acute on chronic HFrEF (heart failure with reduced ejection fraction) (HCC)   SVT (supraventricular tachycardia) (HCC)   Acute combined systolic and diastolic heart failure (HCC)   Pure hypercholesterolemia  Septic shock Secondary to ESBL Klebsiella bacteremia Unclear source of bacteremia Urinalysis clean MRI total spine negative for abscess Was seemingly responding to IV ceftriaxone/cefepime Deteriorated on 5/24 Placed back on Levophed PCCM reengaged Plan: PCCM on consult for vasopressor support Continue meropenem with pharmacy dosing Monitor vitals and fever curve Continue stepdown status for now High risk for deterioration  Acute hypoxic respiratory failure  Acute COPD Exacerbation/left lung multifocal pneumonia Flash pulmonary edema on 4/30 -Intubated for flash pulmonary edema on, extubated 5/7 -Clinically improving, completed antibiotic course -Treated with diuretics as well, now off -Improved and stable, weaned off O2 -Discharge planning, CIR declined -Now bed search initiated for skilled nursing facility -[facility] aware and is following for placement options -Patient has had a complicated hospital course and is significantly debilitated.  In my opinion she is unsafe to return home.  She does have recovery potential and would benefit from standard short-term rehab.   Per case  management appeal process has been initiated. -Appeal has been upheld by [employer].  Will pursue skilled nursing facility at time of discharge.  Disposition plan pending  Sepsis, left lower lobe pneumonia Dysphagia -tracheal aspirate on 4/30 with KLEBSIELLA PNEUMONIAE (resistant to Ampicillin) -Completed 7 days of antibiotics. -Improving -SLP following, continues to have dysphagia, currently on dysphagia 3, honey thick liquids -Will need close SLP FU at rehab to monitor for continued improvement and changes to diet -Currently on honey thick liquids.  Intermittently tolerating p.o. intake.  Acute systolic congestive heart failure/Flash pulmonary edema NSTEMI Paroxysmal SVT -Echo noted with drop in EF down to 30%  -Briefly required inotropes in the ICU, this was then weaned off -Cardiology consulted, left heart cath noted nonobstructive coronary disease, recommended medical management and repeat echo in 2 to 3 months -Continueaspirin, Plavix, metoprolol, Entresto -Clinically euvolemic at this time -Follow-up with Dr. Irish Lack in 2 to 3 weeks  Acute metabolic/toxic encephalopathy -ICU stay complicated by severe encephalopathy while on vent -At baseline on buprenorphine/naloxone which was held on admission, now resumed -Also had an MRI brain which was unremarkable -Improved and stable now, PT OT as tolerated -CIR recommended for rehab.  Declined.  SNF bed search initiated -SNF also declined.  Family filing appeal with [employer].  COPD Ongoing tobacco abuse -Counseled, tapered off prednisone, continue duo nebs   DVT prophylaxis: SQ Lovenox Code Status: Full Family Communication: Daughter [name] [phone] on 5/21, 5/23, at bedside on 5/24 Disposition Plan: Status is: Inpatient  Remains inpatient appropriate because:Inpatient level of care appropriate due to severity of illness   Dispo: The patient is from: Home              Anticipated d/c is to: SNF               Patient currently is not medically stable to d/c.   Difficult to place patient No   Current sepsis/septic shock secondary to ESBL Klebsiella bacteremia.  Currently in stepdown status on vasopressor support.  PCCM consulted.  Disposition plan pending.  Level ofcare: Stepdown  Consultants:   Palliative care  Procedures:   None  Antimicrobials:   None   Subjective: Patient seen and examined.  Diaphoretic.  Tachypneic.  Fatigued  Objective: Vitals:   10/05/20 1515 10/05/20 1530 10/05/20 1545 10/05/20 1556  BP: (!) 88/51 (!) 94/55 (!) 83/55 (!) 98/50  Pulse: 97 99 (!) 101   Resp: 19 (!) 23 (!) 21   Temp:      TempSrc:      SpO2: 93% 95% 95%   Weight:      Height:        Intake/Output Summary (Last 24 hours) at 10/05/2020 1600 Last data filed at 10/05/2020 1500 Gross per 24 hour  Intake 1616.97 ml  Output 400 ml  Net 1216.97 ml   Filed Weights   10/03/20 0500 10/04/20 0437 10/05/20 0500  Weight: 54.6 kg 54.9 kg 54.6 kg    Examination:  General exam: Fatigue.  Appears anxious Respiratory system: Lungs clear.  Normal work of breathing.  2 L Cardiovascular system: Tachycardic, regular rhythm, no murmurs, no pedal edema Gastrointestinal system: Abdomen is nondistended, soft and nontender. No organomegaly or masses felt. Normal bowel sounds heard. Central nervous system: Alert and oriented. No focal neurological deficits. Extremities: Symmetric 5 x 5 power. Skin: No rashes, lesions or ulcers Psychiatry: Judgement and insight appear normal. Mood & affect appropriate.     Data Reviewed: I have personally reviewed following labs and  imaging studies  CBC: Recent Labs  Lab 09/30/20 0430 10/02/20 1823 10/03/20 0822 10/04/20 0407 10/05/20 0325  WBC 8.4 14.8* 8.6 5.5 5.6  NEUTROABS 4.6 12.9* 6.4 3.4 4.0  HGB 10.9* 12.2 11.6* 10.4* 10.0*  HCT 34.8* 37.0 35.8* 32.5* 30.7*  MCV 94.6 92.0 93.5 94.5 93.6  PLT 351 297 305 270 468   Basic Metabolic Panel: Recent  Labs  Lab 10/02/20 0508 10/02/20 1736 10/03/20 0822 10/04/20 0407 10/05/20 0325  NA 138 134* 138 141 134*  K 3.3* 3.9 3.5 3.5 4.1  CL 104 99 105 108 101  CO2 27 26 25 26 26   GLUCOSE 110* 142* 136* 123* 101*  BUN 10 12 10 8 10   CREATININE 0.49 0.58 0.44 0.44 0.49  CALCIUM 8.5* 8.5* 8.4* 8.4* 8.2*  MG  --   --  1.7 1.8 2.1  PHOS  --   --  3.3 2.9 2.5   GFR: Estimated Creatinine Clearance: 45.8 mL/min (by C-G formula based on SCr of 0.49 mg/dL). Liver Function Tests: Recent Labs  Lab 10/02/20 1736 10/03/20 0822  AST 27  --   ALT 28  --   ALKPHOS 77  --   BILITOT 0.6  --   PROT 6.4*  --   ALBUMIN 2.8* 2.5*   No results for input(s): LIPASE, AMYLASE in the last 168 hours. No results for input(s): AMMONIA in the last 168 hours. Coagulation Profile: Recent Labs  Lab 10/02/20 1736  INR 1.4*   Cardiac Enzymes: No results for input(s): CKTOTAL, CKMB, CKMBINDEX, TROPONINI in the last 168 hours. BNP (last 3 results) No results for input(s): PROBNP in the last 8760 hours. HbA1C: No results for input(s): HGBA1C in the last 72 hours. CBG: Recent Labs  Lab 10/04/20 2324 10/05/20 0407 10/05/20 0807 10/05/20 1002 10/05/20 1204  GLUCAP 105* 106* 100* 85 108*   Lipid Profile: No results for input(s): CHOL, HDL, LDLCALC, TRIG, CHOLHDL, LDLDIRECT in the last 72 hours. Thyroid Function Tests: No results for input(s): TSH, T4TOTAL, FREET4, T3FREE, THYROIDAB in the last 72 hours. Anemia Panel: No results for input(s): VITAMINB12, FOLATE, FERRITIN, TIBC, IRON, RETICCTPCT in the last 72 hours. Sepsis Labs: Recent Labs  Lab 10/02/20 1736 10/02/20 1824 10/02/20 2130  PROCALCITON 1.93  --   --   LATICACIDVEN  --  2.4* 1.1    Recent Results (from the past 240 hour(s))  Culture, blood (x 2)     Status: Abnormal   Collection Time: 10/02/20  6:24 PM   Specimen: BLOOD  Result Value Ref Range Status   Specimen Description   Final    BLOOD BLOOD RIGHT HAND Performed at  [facility], [address], [place], [place] [address]    Special Requests   Final    BOTTLES DRAWN AEROBIC AND ANAEROBIC Blood Culture adequate volume Performed at [facility], [address], [place], [place] [address]    Culture  Setup Time   Final    GRAM NEGATIVE RODS IN BOTH AEROBIC AND ANAEROBIC BOTTLES CRITICAL RESULT CALLED TO, READ BACK BY AND VERIFIED WITH: NATHAN BELUE AT [place] 10/03/2020 [place]. Performed at [facility], [place] [address] [place], [place] [address]    Culture (A)  Final    KLEBSIELLA PNEUMONIAE Confirmed Extended Spectrum Beta-Lactamase Producer (ESBL).  In bloodstream infections from ESBL organisms, carbapenems are preferred over piperacillin/tazobactam. They are shown to have a lower risk of mortality.    Report Status 10/05/2020 FINAL  Final  Organism ID, Bacteria KLEBSIELLA PNEUMONIAE  Final      Susceptibility   Klebsiella pneumoniae - MIC*    AMPICILLIN >=32 RESISTANT Resistant     CEFAZOLIN >=64 RESISTANT Resistant     CEFEPIME 8 INTERMEDIATE Intermediate     CEFTAZIDIME >=64 RESISTANT Resistant     CEFTRIAXONE >=64 RESISTANT Resistant     CIPROFLOXACIN <=0.25 SENSITIVE Sensitive     GENTAMICIN <=1 SENSITIVE Sensitive     IMIPENEM <=0.25 SENSITIVE Sensitive     TRIMETH/SULFA <=20 SENSITIVE Sensitive     AMPICILLIN/SULBACTAM >=32 RESISTANT Resistant     PIP/TAZO >=128 RESISTANT Resistant     * KLEBSIELLA PNEUMONIAE  Blood Culture ID Panel (Reflexed)     Status: Abnormal   Collection Time: 10/02/20  6:24 PM  Result Value Ref Range Status   Enterococcus faecalis NOT DETECTED NOT DETECTED Final   Enterococcus Faecium NOT DETECTED NOT DETECTED Final   Listeria monocytogenes NOT DETECTED NOT DETECTED Final   Staphylococcus species NOT DETECTED NOT DETECTED Final   Staphylococcus aureus (BCID) NOT DETECTED NOT DETECTED Final   Staphylococcus epidermidis NOT DETECTED NOT DETECTED Final   Staphylococcus lugdunensis NOT  DETECTED NOT DETECTED Final   Streptococcus species NOT DETECTED NOT DETECTED Final   Streptococcus agalactiae NOT DETECTED NOT DETECTED Final   Streptococcus pneumoniae NOT DETECTED NOT DETECTED Final   Streptococcus pyogenes NOT DETECTED NOT DETECTED Final   A.calcoaceticus-baumannii NOT DETECTED NOT DETECTED Final   Bacteroides fragilis NOT DETECTED NOT DETECTED Final   Enterobacterales DETECTED (A) NOT DETECTED Final    Comment: Enterobacterales represent a large order of gram negative bacteria, not a single organism. CRITICAL RESULT CALLED TO, READ BACK BY AND VERIFIED WITH: NATHAN BELUE AT 0708 ON 10/03/2020 [place].    Enterobacter cloacae complex NOT DETECTED NOT DETECTED Final   Escherichia coli NOT DETECTED NOT DETECTED Final   Klebsiella aerogenes NOT DETECTED NOT DETECTED Final   Klebsiella oxytoca NOT DETECTED NOT DETECTED Final   Klebsiella pneumoniae DETECTED (A) NOT DETECTED Corrected    Comment: CORRECTED ON 05/22 AT 0825: PREVIOUSLY REPORTED AS DETECTED CRITICAL RESULT CALLED TO, READ BACK BY AND VERIFIED WITH: NATHAN BELUE AT 0708 ON 10/03/2020 [place].   Proteus species NOT DETECTED NOT DETECTED Final   Salmonella species NOT DETECTED NOT DETECTED Final   Serratia marcescens NOT DETECTED NOT DETECTED Final   Haemophilus influenzae NOT DETECTED NOT DETECTED Final   Neisseria meningitidis NOT DETECTED NOT DETECTED Final   Pseudomonas aeruginosa NOT DETECTED NOT DETECTED Final   Stenotrophomonas maltophilia NOT DETECTED NOT DETECTED Final   Candida albicans NOT DETECTED NOT DETECTED Final   Candida auris NOT DETECTED NOT DETECTED Final   Candida glabrata NOT DETECTED NOT DETECTED Final   Candida krusei NOT DETECTED NOT DETECTED Final   Candida parapsilosis NOT DETECTED NOT DETECTED Final   Candida tropicalis NOT DETECTED NOT DETECTED Final   Cryptococcus neoformans/gattii NOT DETECTED NOT DETECTED Final   CTX-M ESBL NOT DETECTED NOT DETECTED Final   Carbapenem resistance IMP  NOT DETECTED NOT DETECTED Final   Carbapenem resistance KPC NOT DETECTED NOT DETECTED Final   Carbapenem resistance NDM NOT DETECTED NOT DETECTED Final   Carbapenem resist OXA 48 LIKE NOT DETECTED NOT DETECTED Final   Carbapenem resistance VIM NOT DETECTED NOT DETECTED Final    Comment: Performed at [facility], [address] [place], [place] [address]  Culture, blood (x 2)     Status: Abnormal   Collection Time: 10/02/20  6:32 PM  Specimen: BLOOD  Result Value Ref Range Status   Specimen Description   Final    BLOOD RIGHT ANTECUBITAL Performed at [facility], [address] [place], [place] [address]    Special Requests   Final    BOTTLES DRAWN AEROBIC AND ANAEROBIC Blood Culture results may not be optimal due to an excessive volume of blood received in culture bottles Performed at [facility], [address] [place], [place] [address]    Culture  Setup Time   Final    GRAM NEGATIVE RODS IN BOTH AEROBIC AND ANAEROBIC BOTTLES CRITICAL VALUE NOTED.  VALUE IS CONSISTENT WITH PREVIOUSLY REPORTED AND CALLED VALUE. Performed at [facility], [address] [place], [place] [address]    Culture (A)  Final    KLEBSIELLA PNEUMONIAE SUSCEPTIBILITIES PERFORMED ON PREVIOUS CULTURE WITHIN THE LAST 5 DAYS. Performed at [facility], [place] [address], [place], [place] [address]    Report Status 10/05/2020 FINAL  Final  Culture, Urine     Status: None   Collection Time: 10/03/20  8:52 AM   Specimen: Urine, Clean Catch  Result Value Ref Range Status   Specimen Description   Final    URINE, CLEAN CATCH Performed at [facility], [address], [place], [place] [address]    Special Requests   Final    NONE Performed at [facility], [address], [place], [place] [address]    Culture   Final    NO GROWTH Performed at [facility], [place] [address], [place], [place] [address]    Report Status 10/04/2020 FINAL   Final         Radiology Studies: MR CERVICAL SPINE W WO CONTRAST  Result Date: 10/05/2020 CLINICAL DATA:  Initial evaluation for epidural abscess. Klebsiella bacteremia. EXAM: MRI CERVICAL, THORACIC AND LUMBAR SPINE WITHOUT AND WITH CONTRAST TECHNIQUE: Multiplanar and multiecho pulse sequences of the cervical spine, to include the craniocervical junction and cervicothoracic junction, and thoracic and lumbar spine, were obtained without and with intravenous contrast. CONTRAST:  30mL GADAVIST GADOBUTROL 1 MMOL/ML IV SOLN COMPARISON:  None available. FINDINGS: MRI CERVICAL SPINE FINDINGS Alignment: Straightening of the normal cervical lordosis. No listhesis. Vertebrae: Vertebral body height maintained without acute or chronic fracture. Bone marrow signal intensity within normal limits. Small benign hemangioma noted within the C5 vertebral body. No worrisome osseous lesions. No evidence for osteomyelitis discitis or septic arthritis. Cord: Normal signal and morphology. No epidural abscess or other collection. No abnormal enhancement. Posterior Fossa, vertebral arteries, paraspinal tissues: Chronic microvascular ischemic changes noted within the visualized pons/brainstem. Partially empty sella noted. Craniocervical junction within normal limits. Paraspinous and prevertebral soft tissues normal. Normal flow voids seen within the vertebral arteries bilaterally. Disc levels: C2-C3: Mild disc bulge with right-sided uncovertebral and facet hypertrophy. No spinal stenosis. Moderate right C3 foraminal narrowing. C3-C4: Small central disc protrusion with resultant mild spinal stenosis, but no cord impingement. Superimposed mild uncovertebral and facet hypertrophy without significant foraminal stenosis. C4-C5: Small central disc protrusion with resultant mild spinal stenosis, but no cord impingement. Left-sided facet degeneration. Foramina remain patent. C5-C6: Central disc protrusion indents the ventral thecal sac.  Resultant mild spinal stenosis with minimal flattening of the ventral cord. Left-sided uncovertebral and facet hypertrophy. No significant foraminal encroachment. C6-C7: Minimal disc bulge. No significant spinal stenosis. Foramina remain patent. C7-T1: Negative interspace. Left-sided facet hypertrophy. No significant stenosis. MRI THORACIC SPINE FINDINGS Alignment: Physiologic with preservation of the normal thoracic kyphosis. No  listhesis. Vertebrae: Vertebral body height maintained without acute or chronic fracture. Bone marrow signal intensity mildly heterogeneous but overall within normal limits. No worrisome osseous lesions. No signal changes to suggest osteomyelitis discitis or septic arthritis. Cord: Normal signal and morphology. No epidural abscess or other collection. No abnormal enhancement. Paraspinal and other soft tissues: Paraspinous soft tissues demonstrate no acute finding. Small layering bilateral pleural effusions, left slightly larger than right. Small benign appearing simple cyst partially visualized at the upper pole the left kidney. Visualized visceral structures otherwise unremarkable. Disc levels: T6-7: Central disc osteophyte complex indents the ventral thecal sac (series 22, image 20). Secondary mild flattening of the ventral cord without cord signal changes. Thecal sac fairly capacious at this level without significant spinal stenosis. Foramina remain patent. T7-8: Lobulated central disc protrusion indents the ventral thecal sac, slightly asymmetric to the right. Flattening of the ventral cord, worse on the right. No cord signal changes or significant spinal stenosis. Foramina remain patent. Otherwise, normal for age multilevel degenerative disc disease without significant canal or foraminal stenosis. No other significant focal disc herniation. MRI LUMBAR SPINE FINDINGS Segmentation: Standard. Lowest well-formed disc space labeled the L5-S1 level. Alignment: Trace retrolisthesis of L2 on  L3, with 2 mm anterolisthesis of L4 on L5 and L5 on S1, chronic and facet mediated. Alignment otherwise normal preservation of the normal lumbar lordosis. Vertebrae: Vertebral body height maintained without acute or chronic fracture. Bone marrow signal intensity mildly heterogeneous but overall within normal limits. No worrisome osseous lesions. No findings to suggest osteomyelitis discitis. Mild reactive endplate change about the T11-12 and T12-L1 interspace anteriorly felt to be consistent with degenerative change. No findings to suggest septic arthritis. Conus medullaris and cauda equina: Conus extends to the L1 level. Conus and cauda equina appear normal. No epidural abscess or other collection. No abnormal enhancement. Paraspinal and other soft tissues: Minimal edema and enhancement within the lower posterior paraspinous musculature, nonspecific, but could reflect changes of muscular injury and/or strain. Possible acute myositis could also have this appearance. No discrete collections. Paraspinous soft tissues demonstrate no other acute finding. 1 cm simple cyst present at the upper pole the left kidney. Visualized visceral structures otherwise unremarkable. Disc levels: L1-2:  Unremarkable. L2-3: Trace retrolisthesis. Minimal disc bulge with facet hypertrophy. No stenosis. L3-4: Minimal disc bulge. Mild bilateral facet hypertrophy. No significant canal or foraminal stenosis. L4-5: 2 mm anterolisthesis. Diffuse disc bulge with disc desiccation. Moderate bilateral facet hypertrophy. Associated small bilateral joint effusions. Resultant mild-to-moderate canal with bilateral lateral recess stenosis. Mild left L4 foraminal narrowing. L5-S1: Trace anterolisthesis. Disc desiccation with minimal disc bulge. Moderate right worse than left facet hypertrophy with associated small joint effusions. No canal or lateral recess stenosis. Foramina remain patent. IMPRESSION: 1. No MRI evidence for osteomyelitis discitis or  septic arthritis within the cervical, thoracic, or lumbar spine. No epidural abscess or other collection. 2. Mild edema and enhancement within the lower posterior paraspinous musculature, nonspecific, but could reflect changes of mild muscular injury and/or strain. Possible acute myositis could also have this appearance. No discrete collections. 3. Mild multilevel cervical spondylosis with resultant mild spinal stenosis at C3-4 through C5-6. 4. Degenerative spondylosis at T6-7 and T7-8 with associated cord flattening, but no significant spinal stenosis. 5. Mild for age multilevel lumbar spondylosis, most pronounced at L4-5 where there is resultant mild-to-moderate bilateral lateral recess stenosis. No frank impingement. 6. Small layering bilateral pleural effusions, left greater than right. Electronically Signed   By: Pincus Badder.D.  On: 10/05/2020 03:45   MR THORACIC SPINE W WO CONTRAST  Result Date: 10/05/2020 CLINICAL DATA:  Initial evaluation for epidural abscess. Klebsiella bacteremia. EXAM: MRI CERVICAL, THORACIC AND LUMBAR SPINE WITHOUT AND WITH CONTRAST TECHNIQUE: Multiplanar and multiecho pulse sequences of the cervical spine, to include the craniocervical junction and cervicothoracic junction, and thoracic and lumbar spine, were obtained without and with intravenous contrast. CONTRAST:  33mL GADAVIST GADOBUTROL 1 MMOL/ML IV SOLN COMPARISON:  None available. FINDINGS: MRI CERVICAL SPINE FINDINGS Alignment: Straightening of the normal cervical lordosis. No listhesis. Vertebrae: Vertebral body height maintained without acute or chronic fracture. Bone marrow signal intensity within normal limits. Small benign hemangioma noted within the C5 vertebral body. No worrisome osseous lesions. No evidence for osteomyelitis discitis or septic arthritis. Cord: Normal signal and morphology. No epidural abscess or other collection. No abnormal enhancement. Posterior Fossa, vertebral arteries, paraspinal  tissues: Chronic microvascular ischemic changes noted within the visualized pons/brainstem. Partially empty sella noted. Craniocervical junction within normal limits. Paraspinous and prevertebral soft tissues normal. Normal flow voids seen within the vertebral arteries bilaterally. Disc levels: C2-C3: Mild disc bulge with right-sided uncovertebral and facet hypertrophy. No spinal stenosis. Moderate right C3 foraminal narrowing. C3-C4: Small central disc protrusion with resultant mild spinal stenosis, but no cord impingement. Superimposed mild uncovertebral and facet hypertrophy without significant foraminal stenosis. C4-C5: Small central disc protrusion with resultant mild spinal stenosis, but no cord impingement. Left-sided facet degeneration. Foramina remain patent. C5-C6: Central disc protrusion indents the ventral thecal sac. Resultant mild spinal stenosis with minimal flattening of the ventral cord. Left-sided uncovertebral and facet hypertrophy. No significant foraminal encroachment. C6-C7: Minimal disc bulge. No significant spinal stenosis. Foramina remain patent. C7-T1: Negative interspace. Left-sided facet hypertrophy. No significant stenosis. MRI THORACIC SPINE FINDINGS Alignment: Physiologic with preservation of the normal thoracic kyphosis. No listhesis. Vertebrae: Vertebral body height maintained without acute or chronic fracture. Bone marrow signal intensity mildly heterogeneous but overall within normal limits. No worrisome osseous lesions. No signal changes to suggest osteomyelitis discitis or septic arthritis. Cord: Normal signal and morphology. No epidural abscess or other collection. No abnormal enhancement. Paraspinal and other soft tissues: Paraspinous soft tissues demonstrate no acute finding. Small layering bilateral pleural effusions, left slightly larger than right. Small benign appearing simple cyst partially visualized at the upper pole the left kidney. Visualized visceral structures  otherwise unremarkable. Disc levels: T6-7: Central disc osteophyte complex indents the ventral thecal sac (series 22, image 20). Secondary mild flattening of the ventral cord without cord signal changes. Thecal sac fairly capacious at this level without significant spinal stenosis. Foramina remain patent. T7-8: Lobulated central disc protrusion indents the ventral thecal sac, slightly asymmetric to the right. Flattening of the ventral cord, worse on the right. No cord signal changes or significant spinal stenosis. Foramina remain patent. Otherwise, normal for age multilevel degenerative disc disease without significant canal or foraminal stenosis. No other significant focal disc herniation. MRI LUMBAR SPINE FINDINGS Segmentation: Standard. Lowest well-formed disc space labeled the L5-S1 level. Alignment: Trace retrolisthesis of L2 on L3, with 2 mm anterolisthesis of L4 on L5 and L5 on S1, chronic and facet mediated. Alignment otherwise normal preservation of the normal lumbar lordosis. Vertebrae: Vertebral body height maintained without acute or chronic fracture. Bone marrow signal intensity mildly heterogeneous but overall within normal limits. No worrisome osseous lesions. No findings to suggest osteomyelitis discitis. Mild reactive endplate change about the T11-12 and T12-L1 interspace anteriorly felt to be consistent with degenerative change. No findings to suggest septic  arthritis. Conus medullaris and cauda equina: Conus extends to the L1 level. Conus and cauda equina appear normal. No epidural abscess or other collection. No abnormal enhancement. Paraspinal and other soft tissues: Minimal edema and enhancement within the lower posterior paraspinous musculature, nonspecific, but could reflect changes of muscular injury and/or strain. Possible acute myositis could also have this appearance. No discrete collections. Paraspinous soft tissues demonstrate no other acute finding. 1 cm simple cyst present at the  upper pole the left kidney. Visualized visceral structures otherwise unremarkable. Disc levels: L1-2:  Unremarkable. L2-3: Trace retrolisthesis. Minimal disc bulge with facet hypertrophy. No stenosis. L3-4: Minimal disc bulge. Mild bilateral facet hypertrophy. No significant canal or foraminal stenosis. L4-5: 2 mm anterolisthesis. Diffuse disc bulge with disc desiccation. Moderate bilateral facet hypertrophy. Associated small bilateral joint effusions. Resultant mild-to-moderate canal with bilateral lateral recess stenosis. Mild left L4 foraminal narrowing. L5-S1: Trace anterolisthesis. Disc desiccation with minimal disc bulge. Moderate right worse than left facet hypertrophy with associated small joint effusions. No canal or lateral recess stenosis. Foramina remain patent. IMPRESSION: 1. No MRI evidence for osteomyelitis discitis or septic arthritis within the cervical, thoracic, or lumbar spine. No epidural abscess or other collection. 2. Mild edema and enhancement within the lower posterior paraspinous musculature, nonspecific, but could reflect changes of mild muscular injury and/or strain. Possible acute myositis could also have this appearance. No discrete collections. 3. Mild multilevel cervical spondylosis with resultant mild spinal stenosis at C3-4 through C5-6. 4. Degenerative spondylosis at T6-7 and T7-8 with associated cord flattening, but no significant spinal stenosis. 5. Mild for age multilevel lumbar spondylosis, most pronounced at L4-5 where there is resultant mild-to-moderate bilateral lateral recess stenosis. No frank impingement. 6. Small layering bilateral pleural effusions, left greater than right. Electronically Signed   By: Jeannine Boga M.D.   On: 10/05/2020 03:45   MR Lumbar Spine W Wo Contrast  Result Date: 10/05/2020 CLINICAL DATA:  Initial evaluation for epidural abscess. Klebsiella bacteremia. EXAM: MRI CERVICAL, THORACIC AND LUMBAR SPINE WITHOUT AND WITH CONTRAST TECHNIQUE:  Multiplanar and multiecho pulse sequences of the cervical spine, to include the craniocervical junction and cervicothoracic junction, and thoracic and lumbar spine, were obtained without and with intravenous contrast. CONTRAST:  74mL GADAVIST GADOBUTROL 1 MMOL/ML IV SOLN COMPARISON:  None available. FINDINGS: MRI CERVICAL SPINE FINDINGS Alignment: Straightening of the normal cervical lordosis. No listhesis. Vertebrae: Vertebral body height maintained without acute or chronic fracture. Bone marrow signal intensity within normal limits. Small benign hemangioma noted within the C5 vertebral body. No worrisome osseous lesions. No evidence for osteomyelitis discitis or septic arthritis. Cord: Normal signal and morphology. No epidural abscess or other collection. No abnormal enhancement. Posterior Fossa, vertebral arteries, paraspinal tissues: Chronic microvascular ischemic changes noted within the visualized pons/brainstem. Partially empty sella noted. Craniocervical junction within normal limits. Paraspinous and prevertebral soft tissues normal. Normal flow voids seen within the vertebral arteries bilaterally. Disc levels: C2-C3: Mild disc bulge with right-sided uncovertebral and facet hypertrophy. No spinal stenosis. Moderate right C3 foraminal narrowing. C3-C4: Small central disc protrusion with resultant mild spinal stenosis, but no cord impingement. Superimposed mild uncovertebral and facet hypertrophy without significant foraminal stenosis. C4-C5: Small central disc protrusion with resultant mild spinal stenosis, but no cord impingement. Left-sided facet degeneration. Foramina remain patent. C5-C6: Central disc protrusion indents the ventral thecal sac. Resultant mild spinal stenosis with minimal flattening of the ventral cord. Left-sided uncovertebral and facet hypertrophy. No significant foraminal encroachment. C6-C7: Minimal disc bulge. No significant spinal stenosis. Foramina remain  patent. C7-T1: Negative  interspace. Left-sided facet hypertrophy. No significant stenosis. MRI THORACIC SPINE FINDINGS Alignment: Physiologic with preservation of the normal thoracic kyphosis. No listhesis. Vertebrae: Vertebral body height maintained without acute or chronic fracture. Bone marrow signal intensity mildly heterogeneous but overall within normal limits. No worrisome osseous lesions. No signal changes to suggest osteomyelitis discitis or septic arthritis. Cord: Normal signal and morphology. No epidural abscess or other collection. No abnormal enhancement. Paraspinal and other soft tissues: Paraspinous soft tissues demonstrate no acute finding. Small layering bilateral pleural effusions, left slightly larger than right. Small benign appearing simple cyst partially visualized at the upper pole the left kidney. Visualized visceral structures otherwise unremarkable. Disc levels: T6-7: Central disc osteophyte complex indents the ventral thecal sac (series 22, image 20). Secondary mild flattening of the ventral cord without cord signal changes. Thecal sac fairly capacious at this level without significant spinal stenosis. Foramina remain patent. T7-8: Lobulated central disc protrusion indents the ventral thecal sac, slightly asymmetric to the right. Flattening of the ventral cord, worse on the right. No cord signal changes or significant spinal stenosis. Foramina remain patent. Otherwise, normal for age multilevel degenerative disc disease without significant canal or foraminal stenosis. No other significant focal disc herniation. MRI LUMBAR SPINE FINDINGS Segmentation: Standard. Lowest well-formed disc space labeled the L5-S1 level. Alignment: Trace retrolisthesis of L2 on L3, with 2 mm anterolisthesis of L4 on L5 and L5 on S1, chronic and facet mediated. Alignment otherwise normal preservation of the normal lumbar lordosis. Vertebrae: Vertebral body height maintained without acute or chronic fracture. Bone marrow signal intensity  mildly heterogeneous but overall within normal limits. No worrisome osseous lesions. No findings to suggest osteomyelitis discitis. Mild reactive endplate change about the T11-12 and T12-L1 interspace anteriorly felt to be consistent with degenerative change. No findings to suggest septic arthritis. Conus medullaris and cauda equina: Conus extends to the L1 level. Conus and cauda equina appear normal. No epidural abscess or other collection. No abnormal enhancement. Paraspinal and other soft tissues: Minimal edema and enhancement within the lower posterior paraspinous musculature, nonspecific, but could reflect changes of muscular injury and/or strain. Possible acute myositis could also have this appearance. No discrete collections. Paraspinous soft tissues demonstrate no other acute finding. 1 cm simple cyst present at the upper pole the left kidney. Visualized visceral structures otherwise unremarkable. Disc levels: L1-2:  Unremarkable. L2-3: Trace retrolisthesis. Minimal disc bulge with facet hypertrophy. No stenosis. L3-4: Minimal disc bulge. Mild bilateral facet hypertrophy. No significant canal or foraminal stenosis. L4-5: 2 mm anterolisthesis. Diffuse disc bulge with disc desiccation. Moderate bilateral facet hypertrophy. Associated small bilateral joint effusions. Resultant mild-to-moderate canal with bilateral lateral recess stenosis. Mild left L4 foraminal narrowing. L5-S1: Trace anterolisthesis. Disc desiccation with minimal disc bulge. Moderate right worse than left facet hypertrophy with associated small joint effusions. No canal or lateral recess stenosis. Foramina remain patent. IMPRESSION: 1. No MRI evidence for osteomyelitis discitis or septic arthritis within the cervical, thoracic, or lumbar spine. No epidural abscess or other collection. 2. Mild edema and enhancement within the lower posterior paraspinous musculature, nonspecific, but could reflect changes of mild muscular injury and/or strain.  Possible acute myositis could also have this appearance. No discrete collections. 3. Mild multilevel cervical spondylosis with resultant mild spinal stenosis at C3-4 through C5-6. 4. Degenerative spondylosis at T6-7 and T7-8 with associated cord flattening, but no significant spinal stenosis. 5. Mild for age multilevel lumbar spondylosis, most pronounced at L4-5 where there is resultant mild-to-moderate bilateral lateral recess  stenosis. No frank impingement. 6. Small layering bilateral pleural effusions, left greater than right. Electronically Signed   By: Jeannine Boga M.D.   On: 10/05/2020 03:45   DG Chest Port 1 View  Result Date: 10/05/2020 CLINICAL DATA:  75 year old female with shortness of breath. EXAM: PORTABLE CHEST - 1 VIEW COMPARISON:  10/02/2020, 09/21/2020 FINDINGS: The mediastinal contours are within normal limits. No cardiomegaly. Atherosclerotic calcification of the aortic arch. Coronary stent in place. Mild bibasilar hazy opacities, similar to comparison. No significant pleural effusion, focal consolidation, or pneumothorax. Enteric radiopaque material visualized near the splenic flexure. No acute osseous abnormality. IMPRESSION: Unchanged faint bibasilar hazy opacities, likely representing resolving pneumonia. No new or worsening opacities. Aortic Atherosclerosis (ICD10-I70.0). Electronically Signed   By: Ruthann Cancer MD   On: 10/05/2020 10:34        Scheduled Meds: . aspirin  81 mg Oral QHS  . buprenorphine-naloxone  2 tablet Sublingual Daily  . Chlorhexidine Gluconate Cloth  6 each Topical QHS  . clopidogrel  75 mg Oral QHS  . fluticasone  1 spray Each Nare Daily  . insulin aspart  0-15 Units Subcutaneous Q4H  . levothyroxine  88 mcg Oral Q0600  . loratadine  10 mg Oral Daily  . mouth rinse  15 mL Mouth Rinse BID  . metoprolol succinate  50 mg Oral Daily  . midodrine  10 mg Oral TID WC  . mometasone-formoterol  2 puff Inhalation BID  . multivitamin with minerals   1 tablet Oral Daily  . pantoprazole  40 mg Oral Daily  . rosuvastatin  40 mg Oral QHS  . sodium chloride flush  3 mL Intravenous Q12H  . sodium chloride flush  3 mL Intravenous Q12H   Continuous Infusions: . sodium chloride Stopped (09/24/20 1321)  . sodium chloride 5 mL/hr at 10/05/20 1500  . meropenem (MERREM) IV Stopped (10/05/20 1205)  . norepinephrine (LEVOPHED) Adult infusion 3 mcg/min (10/05/20 1500)     LOS: 25 days    Time spent: 25 minutes    Sidney Ace, MD [facility] Pager [phone]  If 7PM-7AM, please contact night-coverage 10/05/2020, 4:00 PM

## 2020-10-05 NOTE — Progress Notes (Signed)
GOALS OF CARE DISCUSSION  The Clinical status was relayed to family in detail. Daughters at bedside, PATIENT  THE PATIENT  DOES NOT WANT TO DISCLOSE ANY INFORMATION TO FAMILY Updated and notified of patients medical condition.   Explained to family course of therapy and the modalities    PATIENT REMAINS FULL CODE PATIENT WITH SEVERE MALNUTRITION LOSING WEIGHT +ESOPHAGEAL DYSMOTILITY NEEDS PEG TUBE BUT PATIENT REFUSING ANY INTERVENTION AT THIS TIME  THIS IS A VERY DIFFICULT SITUATION PALLIATIVE CARE TEAM TO FOLLOW UP  Family are satisfied with Plan of action and management. All questions answered  Additional CC time 35 mins   Rashonda Warrior Patricia Pesa, M.D.  [facility]  Medical Director [place] Director [facility] Cardio-Pulmonary Department

## 2020-10-05 NOTE — TOC Progression Note (Signed)
Transition of Care [facility]) - Progression Note    Patient Details  Name: Kathryn Garrett MRN: [id number] Date of Birth: 01-07-1946  Transition of Care [facility]) CM/SW [place], [place] Phone Number: 10/05/2020, 4:04 PM  Clinical Narrative:     Patient remains in the ICU.  Patient SNF bed offers but is not medically ready for discharge. TOC will continue to follow.   Barriers to Discharge: Continued Medical Work up  Expected Discharge Plan and Services   In-house Referral: Clinical Social Work   Post Acute Care Choice:  [facility]) Living arrangements for the past 2 months: Single Family Home                                       Social Determinants of Health (SDOH) Interventions    Readmission Risk Interventions No flowsheet data found.

## 2020-10-05 NOTE — Progress Notes (Signed)
Patient become acutely tachycardic with HR 150-160s, hypertensive, RR 50-60s, complaining of SOB and feeling very cold. Patient noted to be shivering. Initial oral temp 99.2, but patient was very warm to touch. Rechecked temp 15 minutes later, 102.9 orally.   Dr. Priscella Mann and Dr. Mortimer Fries notified of acute changes. Chest XR ordered and completed with no acute findings, ABG completed and was unremarkable, and BG 85.   Per Dr. Priscella Mann, ABX coverage needed changing to cover resistant Klebsiella. LR bolus ordered and given, and rocephin changed meropenem. After first dose of meropenem, patient continued to improve. By the afternoon, patient was NSR, normothermic but levophed had to be restarted for BP support.

## 2020-10-06 DIAGNOSIS — J441 Chronic obstructive pulmonary disease with (acute) exacerbation: Secondary | ICD-10-CM | POA: Diagnosis not present

## 2020-10-06 DIAGNOSIS — J9601 Acute respiratory failure with hypoxia: Secondary | ICD-10-CM | POA: Diagnosis not present

## 2020-10-06 DIAGNOSIS — I214 Non-ST elevation (NSTEMI) myocardial infarction: Secondary | ICD-10-CM | POA: Diagnosis not present

## 2020-10-06 DIAGNOSIS — I5041 Acute combined systolic (congestive) and diastolic (congestive) heart failure: Secondary | ICD-10-CM | POA: Diagnosis not present

## 2020-10-06 LAB — CBC WITH DIFFERENTIAL/PLATELET
Abs Immature Granulocytes: 0.06 10*3/uL (ref 0.00–0.07)
Basophils Absolute: 0.1 10*3/uL (ref 0.0–0.1)
Basophils Relative: 1 %
Eosinophils Absolute: 0.2 10*3/uL (ref 0.0–0.5)
Eosinophils Relative: 2 %
HCT: 32.7 % — ABNORMAL LOW (ref 36.0–46.0)
Hemoglobin: 10.6 g/dL — ABNORMAL LOW (ref 12.0–15.0)
Immature Granulocytes: 1 %
Lymphocytes Relative: 21 %
Lymphs Abs: 2.3 10*3/uL (ref 0.7–4.0)
MCH: 30.2 pg (ref 26.0–34.0)
MCHC: 32.4 g/dL (ref 30.0–36.0)
MCV: 93.2 fL (ref 80.0–100.0)
Monocytes Absolute: 1.2 10*3/uL — ABNORMAL HIGH (ref 0.1–1.0)
Monocytes Relative: 11 %
Neutro Abs: 7.4 10*3/uL (ref 1.7–7.7)
Neutrophils Relative %: 64 %
Platelets: 255 10*3/uL (ref 150–400)
RBC: 3.51 MIL/uL — ABNORMAL LOW (ref 3.87–5.11)
RDW: 14.6 % (ref 11.5–15.5)
Smear Review: NORMAL
WBC: 11.2 10*3/uL — ABNORMAL HIGH (ref 4.0–10.5)
nRBC: 0 % (ref 0.0–0.2)

## 2020-10-06 LAB — BASIC METABOLIC PANEL
Anion gap: 9 (ref 5–15)
BUN: 12 mg/dL (ref 8–23)
CO2: 29 mmol/L (ref 22–32)
Calcium: 8.4 mg/dL — ABNORMAL LOW (ref 8.9–10.3)
Chloride: 101 mmol/L (ref 98–111)
Creatinine, Ser: 0.66 mg/dL (ref 0.44–1.00)
GFR, Estimated: >60 mL/min (ref >60)
Glucose, Bld: 117 mg/dL — ABNORMAL HIGH (ref 70–99)
Potassium: 4.3 mmol/L (ref 3.5–5.1)
Sodium: 139 mmol/L (ref 135–145)

## 2020-10-06 LAB — GLUCOSE, CAPILLARY
Glucose-Capillary: 105 mg/dL — ABNORMAL HIGH (ref 70–99)
Glucose-Capillary: 145 mg/dL — ABNORMAL HIGH (ref 70–99)
Glucose-Capillary: 138 mg/dL — ABNORMAL HIGH (ref 70–99)
Glucose-Capillary: 137 mg/dL — ABNORMAL HIGH (ref 70–99)
Glucose-Capillary: 139 mg/dL — ABNORMAL HIGH (ref 70–99)

## 2020-10-06 LAB — MAGNESIUM: Magnesium: 2.3 mg/dL (ref 1.7–2.4)

## 2020-10-06 LAB — PHOSPHORUS: Phosphorus: 3.5 mg/dL (ref 2.5–4.6)

## 2020-10-06 LAB — PROCALCITONIN: Procalcitonin: 39.42 ng/mL

## 2020-10-06 NOTE — Progress Notes (Addendum)
NAME:  Kathryn Garrett, MRN:  [id number], DOB:  [birth year], LOS: 2 ADMISSION DATE:  09/10/2020, INITIAL CONSULTATION DATE:  09/11/2020 REFERRING MD: Dr. Damita Dunnings, CHIEF COMPLAINT: SOB  Brief Patient Description  Kathryn Garrett is a 67F with CAD status post RCA PCI in 2017 and in-stent restenosis requiring PCI, VF arrest,, chronic systolic diastolic heart failure, COPD, ongoing tobacco abuse, hypertension, hyperlipidemia, COPD, and narcotic abuse admitted with COPD exacerbation and Klebsiella pneumonia as well as acute on chronic systolic and diastolic heart failure.  Hospital course was complicated by cardiogenic shock requiring milrinone for diuresis and hypoxic respiratory failure requiring intubation. She was treated with Cefepime/Ancef for aspiration. She underwent left and right heart cath on 5/13 and was found to have mild to moderate nonobstructive CAD and normal filling pressures. She also had SVT that was managed with metoprolol. Patient was extubated and transferred to the floor. On 5/21 she was noted to be hypotensive despite IVFs and in sinus tachycardia to the 150s.  Lacate was 2.4.  Blood cultures with GNRs.  WBC was 14.8.  No significant infection in urine. CXR was unremarkable.  She was treated with Cefepime for Klebsiella pneumonia and transferred to ICU.  SHe was started on levophed for septic shock and PCCM re-consulted to assist with management.  Pertinent  Medical History  CAD status post RCA PCI in 2017 and in-stent restenosis requiring PCI,  VF arrest,,  chronic systolic diastolic heart failure,  COPD,  ongoing tobacco abuse,  hypertension,  hyperlipidemia,  Chronic narcotic abuse    Significant Hospital Events: Including procedures, antibiotic start and stop dates in addition to other pertinent events   09/10/2020-patient admits admitted with hospitalist service for COPD exacerbation and left lower lobe pneumonia 09/11/2020-overnight patient had suspected flash pulmonary  edema, dyspnea followed by somnolence requiring emergent intubation and mechanical ventilation.  Admitted to ICU 09/12/20- patient failed SBT , she had blood tinged secretions fromETT and aggitation, family was at bedside and we agreed on giving her more time and try again.  09/14/20- Failed SBT (became hypoxic with sats mid 80's, increased WOB and assessory muscle use, HR 140-160's). Will diurese today and add Metoprolol.  Tracheal aspirate from 4/30 with KLEBSIELLA PNEUMONIAE (resistant to Ampicillin)   09/15/20- Failed SBT (increased WOB and assessory muscle use, RR 40's, HR 140-150's, Hypertensive); plan to add scheduled PO Klonopin and Oxycodone, Lasix 40 mg IV x1, consult Palliative Care 09/16/20- Worsening Leukocytosis up to 21.7, low grade fever overnight, increased secretions overnight, will repeat Tracheal aspirate, remove central line, add mucinex, plan for SBT 09/17/20-[name] passed SBT in AM and following commands still having some moderate secretions 09/18/20-  Extubated yesterday.  Failed swallow eval today.  09/20/20- Weaned to room air, Leukocytosis improving; Hemodynamically stable 09/27/20- patient is coughing up phlegm but clinically is improved, we discussed pneumonia and COPD with outpatient clinic follow up with pulmonology.  There is plan for post dc rehab 09/28/20- patient is improved and she is being optimized for CIR, pulmonary will sign off and can follow up on outpatient basis 09/29/20- family at bedside today.  We discussed barium swallow with aspiration.  09/30/20- patient had PT today she walked today without need of additional supplemental O2 therapy.  She generally has been walking ok but has had severe LE weakness here.  She ate lunch infront of me had cough after couple bites.  We disucssed aspiartion risk and her barium swallow.  10/01/20: PCCM signed off  10/02/20: Pt with hypotension despite aggressive fluid resuscitation  concerning for sepsis secondary to UTI requiring levophed  gtt and transfer to stepdown unit.  PCCM signed back on case to assist with management  10/03/20: Remains on Levophed 10/04/20: Weaning down Levophed, increase Midodrine to 10 mg TID; consult ID for Bacteremia  10/05/20: Levophed weaned off, MRI spine yesterday negative for osteomyelitis discitis or septic arthritis, no epidural abscess or other fluid collection. Klebsiella is +ESBL.  ABX changed to Meropenem. 10/06/20: Deteriorated yesterday (fever, tachycardic, yesterday), Klebsiella ESBL +, ABX changed to Meropenem, requiring low dose Levophed again (currently on 5 mcg), ID following  Cultures:  4/29: SARS-CoV-2 PCR>> negative 4/29:Influenza PCR>> negative 5/1: Strep pneumo urinary antigen>>negative 5/1: Legionella urinary antigen>>negative 5/5: Tracheal aspirate> candida albicans 5/21: Blood culture x2>>3 out 4 bottles growing GNR BCID positive for klebsiella pneumoniae (+ESBL) 5/21: UCx> negative  Antimicrobials:  Ceftriaxone 5/22>>5/24 Meropenem 5/24>> Cefepime 5/21 x 1 dose Metronidazole 5/21 x 1 dose  Interim History / Subjective:  -Pt deteriorated yesterday (fever, tachycardic, tachypneic).  Klebsiella ESBL +, ABX changed to Meropenem -Requiring low dose Levophed again (currently on 5 mcg), in process of weaning -Afebrile, on 2L nasal cannula  -WBC increased to 11.2 K today -Pt denies all complaints (HA, chest pain, SOB, cough, sputum, N/V/D, abdominal pain, dysuria, fever, chills, edema, flank pain) -Today pt gives permission to update her son who is at bedside   OBJECTIVE   Blood pressure 114/89, pulse (!) 118, temperature 98 F (36.7 C), temperature source Oral, resp. rate 18, height 4\' 11"  (1.499 m), weight 54.6 kg, SpO2 97 %.        Intake/Output Summary (Last 24 hours) at 10/06/2020 1100 Last data filed at 10/06/2020 0900 Gross per 24 hour  Intake 2123.01 ml  Output 600 ml  Net 1523.01 ml   Filed Weights   10/04/20 0437 10/05/20 0500 10/06/20 0500  Weight: 54.9  kg 54.6 kg 54.6 kg   Physical Examination: GENERAL:Acutely ill appearing 76 year-old patient, sitting in bed, no acute distress.  EYES: Pupils PERRLA. No scleral icterus. Extraocular muscles intact.  HEENT: Head atraumatic, normocephalic. Oropharynx and nasopharynx clear.  NECK:  Supple, no jugular venous distention. No thyroid enlargement, no tenderness.  LUNGS: Clear breath sounds bilaterally, no wheezing, rales,rhonchi or crepitation. No use of accessory muscles of respiration.  CARDIOVASCULAR: Regular rate & rhythm, S1, S2. No murmurs, rubs, or gallops. 2+ distal pulses ABDOMEN: Soft, nontender, nondistended. Bowel sounds present. No organomegaly or mass.  EXTREMITIES: No pedal edema, cyanosis, or clubbing.  NEUROLOGIC: Cranial nerves II through XII are intact. Muscle strength 5/5 in all extremities. Sensation intact. Gait not checked.  PSYCHIATRIC: The patient is alert and oriented x 4.  SKIN: Warm and dry. No obvious rash, lesion, or ulcer.   Labs/imaging that I havepersonally reviewed  (right click and "Reselect all SmartList Selections" daily)   Labs 10/06/2020:  WBC 11.2, Procalcitonin 39.4, hgb 10.6, Hct 32.7, glucose 117 CXR 10/05/20: Unchanged faint bibasilar hazy opacities, likely representing resolving pneumonia. No new or worsening opacities. MRI Spine (cervical, thoracic, lumbar) 10/03/20>>1. No MRI evidence for osteomyelitis discitis or septic arthritis within the cervical, thoracic, or lumbar spine. No epidural abscess or other collection. 2. Mild edema and enhancement within the lower posterior paraspinous musculature, nonspecific, but could reflect changes of mild muscular injury and/or strain. Possible acute myositis could also have this appearance. No discrete collections. 3. Mild multilevel cervical spondylosis with resultant mild spinal stenosis at C3-4 through C5-6. 4. Degenerative spondylosis at T6-7 and T7-8 with associated cord flattening, but  no significant  spinal stenosis. 5. Mild for age multilevel lumbar spondylosis, most pronounced at L4-5 where there is resultant mild-to-moderate bilateral lateral recess stenosis. No frank impingement. 6. Small layering bilateral pleural effusions, left greater than right.  Labs   CBC: Recent Labs  Lab 10/02/20 1823 10/03/20 0822 10/04/20 0407 10/05/20 0325 10/06/20 0413  WBC 14.8* 8.6 5.5 5.6 11.2*  NEUTROABS 12.9* 6.4 3.4 4.0 7.4  HGB 12.2 11.6* 10.4* 10.0* 10.6*  HCT 37.0 35.8* 32.5* 30.7* 32.7*  MCV 92.0 93.5 94.5 93.6 93.2  PLT 297 305 270 211 417    Basic Metabolic Panel: Recent Labs  Lab 10/02/20 1736 10/03/20 0822 10/04/20 0407 10/05/20 0325 10/06/20 0413  NA 134* 138 141 134* 139  K 3.9 3.5 3.5 4.1 4.3  CL 99 105 108 101 101  CO2 26 25 26 26 29   GLUCOSE 142* 136* 123* 101* 117*  BUN 12 10 8 10 12   CREATININE 0.58 0.44 0.44 0.49 0.66  CALCIUM 8.5* 8.4* 8.4* 8.2* 8.4*  MG  --  1.7 1.8 2.1 2.3  PHOS  --  3.3 2.9 2.5 3.5   GFR: Estimated Creatinine Clearance: 45.8 mL/min (by C-G formula based on SCr of 0.66 mg/dL). Recent Labs  Lab 10/02/20 1736 10/02/20 1823 10/02/20 1824 10/02/20 2130 10/03/20 0822 10/04/20 0407 10/05/20 0325 10/06/20 0413  PROCALCITON 1.93  --   --   --   --   --   --   --   WBC  --    < >  --   --  8.6 5.5 5.6 11.2*  LATICACIDVEN  --   --  2.4* 1.1  --   --   --   --    < > = values in this interval not displayed.    Liver Function Tests: Recent Labs  Lab 10/02/20 1736 10/03/20 0822  AST 27  --   ALT 28  --   ALKPHOS 77  --   BILITOT 0.6  --   PROT 6.4*  --   ALBUMIN 2.8* 2.5*   No results for input(s): LIPASE, AMYLASE in the last 168 hours. No results for input(s): AMMONIA in the last 168 hours.  ABG    Component Value Date/Time   PHART 7.48 (H) 10/05/2020 1000   PCO2ART 28 (L) 10/05/2020 1000   PO2ART 66 (L) 10/05/2020 1000   HCO3 20.9 10/05/2020 1000   TCO2 23 01/14/2009 0327   ACIDBASEDEF 1.6 10/05/2020 1000   O2SAT  94.2 10/05/2020 1000     Coagulation Profile: Recent Labs  Lab 10/02/20 1736  INR 1.4*    Cardiac Enzymes: No results for input(s): CKTOTAL, CKMB, CKMBINDEX, TROPONINI in the last 168 hours.  HbA1C: Hgb A1c MFr Bld  Date/Time Value Ref Range Status  09/11/2020 04:37 AM 6.4 (H) 4.8 - 5.6 % Final    Comment:    (NOTE) Pre diabetes:          5.7%-6.4%  Diabetes:              >6.4%  Glycemic control for   <7.0% adults with diabetes   10/02/2019 12:28 PM 6.4 (H) 4.8 - 5.6 % Final    Comment:             Prediabetes: 5.7 - 6.4          Diabetes: >6.4          Glycemic control for adults with diabetes: <7.0     CBG: Recent Labs  Lab 10/05/20 1700 10/05/20 1925 10/05/20 2340 10/06/20 0402 10/06/20 0801  GLUCAP 137* 171* 126* 105* 145*    Allergies Allergies  Allergen Reactions  . Azithromycin Rash     Home Medications  Prior to Admission medications   Medication Sig Start Date End Date Taking? Authorizing Provider  acetaminophen (TYLENOL) 500 MG tablet Take 1,000 mg by mouth every 6 (six) hours as needed for headache.   Yes [provider]  albuterol (PROVENTIL HFA;VENTOLIN HFA) 108 (90 Base) MCG/ACT inhaler Inhale 2 puffs into the lungs every 4 (four) hours as needed for wheezing or shortness of breath.   Yes [provider]  amoxicillin (AMOXIL) 500 MG tablet Take 500 mg by mouth 3 (three) times daily. 08/03/20  Yes [provider]  aspirin EC 81 MG tablet Take 81 mg by mouth at bedtime.    Yes [provider]  azelastine (ASTELIN) 0.1 % nasal spray USE 2 SPRAYS BID UNTIL DIRECTED TO STOP. USE FOR RUNNY NOSE 08/14/17  Yes [provider]  Buprenorphine HCl-Naloxone HCl 8-2 MG FILM Place 0.5 Film under the tongue daily.   Yes [provider]  cetirizine (ZYRTEC) 10 MG tablet Take 1 tablet (10 mg total) by mouth daily. 03/11/18  Yes Alene Mires, Sahar M, PA-C  clopidogrel (PLAVIX) 75 MG tablet TAKE 1 TABLET(75 MG) BY  MOUTH AT BEDTIME 09/16/20  Yes Jettie Booze, MD  dexamethasone (DECADRON) 6 MG tablet dexamethasone 6 mg tablet  TAKE 1 TABLET BY MOUTH EVERY DAY   Yes [provider]  doxycycline (VIBRAMYCIN) 100 MG capsule doxycycline hyclate 100 mg capsule  TAKE 1 CAPSULE BY MOUTH TWICE DAILY   Yes [provider]  fluticasone (FLONASE) 50 MCG/ACT nasal spray Place 1 spray into both nostrils daily.   Yes [provider]  furosemide (LASIX) 40 MG tablet Take 1 tablet (40 mg total) by mouth daily as needed (swelling). 01/16/20  Yes Jettie Booze, MD  ibuprofen (ADVIL,MOTRIN) 600 MG tablet Take 600 mg by mouth as needed for pain. 08/14/17  Yes [provider]  isosorbide mononitrate (IMDUR) 30 MG 24 hr tablet TAKE 1 TABLET(30 MG) BY MOUTH DAILY 09/16/20  Yes Jettie Booze, MD  levothyroxine (SYNTHROID) 88 MCG tablet Take 88 mcg by mouth every morning. 05/26/20  Yes [provider]  metoprolol tartrate (LOPRESSOR) 25 MG tablet TAKE 1 TABLET BY MOUTH TWICE DAILY 09/16/20  Yes Jettie Booze, MD  mupirocin ointment (BACTROBAN) 2 % mupirocin 2 % topical ointment  APPLY TOPICALLY TO LOWER LEG THREE TIMES DAILY FOR 10 DAYS   Yes [provider]  nystatin cream (MYCOSTATIN) Apply topically 2 (two) times daily. 08/11/20  Yes [provider]  potassium chloride SA (KLOR-CON) 20 MEQ tablet Take 1 tablet (20 mEq total) by mouth daily as needed (Take with furosemide (lasix)). 01/16/20  Yes Jettie Booze, MD  predniSONE (DELTASONE) 20 MG tablet prednisone 20 mg tablet   Yes [provider]  rosuvastatin (CRESTOR) 20 MG tablet TAKE 1 TABLET(20 MG) BY MOUTH DAILY 09/16/20  Yes Jettie Booze, MD  TRELEGY ELLIPTA 100-62.5-25 MCG/INH AEPB Take 1 puff by mouth daily. 08/13/20  Yes [provider]  triamcinolone cream (KENALOG) 0.1 % Apply topically 2 (two) times daily. to affected area 08/11/20  Yes [provider]   metoprolol succinate (TOPROL-XL) 50 MG 24 hr tablet Take 1 tablet (50 mg total) by mouth daily. Take with or immediately following a meal. 09/29/20   Domenic Polite,  MD  nitroGLYCERIN (NITROSTAT) 0.4 MG SL tablet Place 1 tablet (0.4 mg total) under the tongue every 5 (five) minutes as needed for chest pain. 03/12/18   Jettie Booze, MD  pantoprazole (PROTONIX) 40 MG tablet Take 1 tablet (40 mg total) by mouth daily. 09/29/20   Domenic Polite, MD  sacubitril-valsartan (ENTRESTO) 24-26 MG Take 1 tablet by mouth 2 (two) times daily. 09/28/20   Domenic Polite, MD   Scheduled Meds: . aspirin  81 mg Oral QHS  . buprenorphine-naloxone  2 tablet Sublingual Daily  . Chlorhexidine Gluconate Cloth  6 each Topical QHS  . clopidogrel  75 mg Oral QHS  . fluticasone  1 spray Each Nare Daily  . insulin aspart  0-15 Units Subcutaneous Q4H  . levothyroxine  88 mcg Oral Q0600  . loratadine  10 mg Oral Daily  . mouth rinse  15 mL Mouth Rinse BID  . metoprolol succinate  50 mg Oral Daily  . midodrine  10 mg Oral TID WC  . mometasone-formoterol  2 puff Inhalation BID  . multivitamin with minerals  1 tablet Oral Daily  . pantoprazole  40 mg Oral Daily  . rosuvastatin  40 mg Oral QHS  . sodium chloride flush  3 mL Intravenous Q12H  . sodium chloride flush  3 mL Intravenous Q12H   Continuous Infusions: . sodium chloride Stopped (09/24/20 1321)  . sodium chloride Stopped (10/05/20 2214)  . meropenem (MERREM) IV 1 g (10/05/20 2135)  . norepinephrine (LEVOPHED) Adult infusion 5 mcg/min (10/06/20 0900)   PRN Meds:.acetaminophen **OR** acetaminophen, albuterol, artificial tears, docusate, guaiFENesin, ondansetron (ZOFRAN) IV, polyethylene glycol  Resolved Hospital Problem list     ASSESSMENT & PLAN   Septic shock  Acute systolic congestive heart failure Acute non-ST elevation MI s/p cardiac cath 05/13 recommendation medical management  New diagnosis of A. fib with RVR ~ currently Sinus  tachycardia -Continuous cardiac monitoring -Maintain MAP >65 -Vasopressors as needed to maintain MAP goal -Continue midodrine to 10 mg TID -Patient's ejection fraction on echocardiogram is 30% -Diuresis as renal function and BP permits >> currently on hold due to vasopressors -Cardiology following, input is appreciated -Hold entresto and immediate release metoprolol due to hypotension  -Continue aspirin, plavix, and statin -Off Amiodarone   Severe sepsis due to Lyles (+ESBL), currently unclear etiology Previous Klebsiella Pneumonia ~ treated (Completed 7 days of Cefepime/Ancef) -Monitor fever curve -Trend WBC's & Procalcitonin -Follow cultures as above -Blood cultures on 10/02/20 with klebsiella pneumoniae & Enterobacteriaceae  -ABX changed to Meropenem 10/05/20 -Urinalysis and urine culture on 5/21 negative (? Could be impacted by previous exposure to antibiotics) -CXR on 5/24 without new/worsening opacities, shows unchanged faint bibasilar opacities consistent with resolving pneumonia -MRI spine 10/04/20 negative for osteomyelitis discitis or septic arthritis within the cervical, thoracic, or lumbar spine. No epidural abscess or other collection. -ID following, appreciate input ~ will follow recommendations ~ recommends 7 days of Meropenem  Acute hypoxic respiratory failure secondary to Acute COPD Exacerbation & left lung multifocal pneumonia -S/p Extubation 5/7 -Supplemental O2 as needed to maintain O2 sats 88 to 94% -Follow intermittent CXR & ABG as needed -Completed 7 day course of ABX for Klebsiella Pneumonia -Bronchodilators as needed -Continue Dulera -Aggressive pulmonary toilet -PT/OT  Acute metabolic/toxic encephalopathy ~ resolved  -Continue outpatient Suboxone due to narcotic abuse hx        Best practice (right click and "Reselect all SmartList Selections" daily)   Diet: DYS 3 Pain/Anxiety/Delirium protocol: not indicated  VAP  protocol: not indicated  DVT prophylaxis: SCD and subq lovenox  GI prophylaxis: PPI Glucose control:  SSI Yes Central venous access: n/a Arterial line:  N/A Foley: N/A Mobility: Bed rest PT consulted: NA Code Status:  full code Disposition: Stepdown   Pt's son at bedside today 10/06/20.  Pt gives permission to update son at bedside.  Update provided and all questions answered.  Darel Hong, AGACNP-BC [facility] Prefer epic messenger for cross cover needs If after hours, please call E-link    PCCM ATTENDING ATTESTATION:  Patient seen and examined and relevant ancillary tests reviewed.   I agree with the assessment and plan of care as outlined by Darel Hong NP.  This patient was not seen as a shared visit. The following reflects my independent critical care time.  I  personally  reviewed database in its entirety and discussed care plan in detail. In addition, this patient was discussed on multidisciplinary rounds.   I agree with assessment and plan.   Corrin Parker, M.D.  [facility]  Medical Director [place] Director [facility] Cardio-Pulmonary Department

## 2020-10-06 NOTE — Progress Notes (Signed)
PROGRESS NOTE    Kathryn Garrett  ATF:[id number] DOB: [birth year] DOA: 09/10/2020 PCP: Imagene Riches, NP   Brief Narrative:  75/F presented to the [facility] ED from home with complaints of shortness of breath -Admitted on 4/29 with hypoxic respiratory failure secondary to COPD exacerbation and community-acquired pneumonia [facility] course was complicated by flash pulmonary edema and NSTEMI requiring mechanical ventilation, intubated on 4/30  Post extubation patient continued to slowly improve.  Initially was slated for CIR however unfortunately insurance denied.  Currently bed search initiated for skilled nursing facility.  Patient remains medically stable for discharge.  Received notification of case management that SNF had been denied by patient's insurance.  Appeal currently in progress.  5/22: Patient had acute onset of tachyarrhythmia yesterday.  Responded to bedside.  Ventricular rates approximately 60.  Unclear etiology as morphology.  Sinus.  Initially started amiodarone infusion however patient then dropped her blood pressure and became febrile.  Was transferred to the ICU overnight due to declining blood pressures despite aggressive fluid resuscitation.  Patient now in ICU.  Evidence of septic shock secondary to Klebsiella bacteremia.  On IV cefepime.  Mentating clearly.  Unclear source of sepsis.   5/23: Dynamics improving.  The exact etiology of the Klebsiella bacteremia is unclear.  Urinalysis did not grow any bacteria.  Case has been discussed with PCCM.  The Klebsiella pneumonia is the same bacteria that was found on tracheal aspirate when patient was intubated.  This raises possibility of another infectious focus somewhere in the body.  Considering TEE and will pursue MRI total spine to rule out spinal abscess.  5/24: Notified by pharmacy that Klebsiella isolated from the blood is demonstrating resistance gene.  Antibiotic switched to meropenem.  Before new antibiotic to be started  patient's clinical condition deteriorated.  She became acutely tachycardic, febrile, tachypneic.  PCCM reengaged for recommendations.  Assessment & Plan:   Principal Problem:   Acute exacerbation of chronic obstructive pulmonary disease (COPD) (HCC) Active Problems:   CAP (community acquired pneumonia)   CAD in native artery   HTN (hypertension)   Tobacco abuse   Hyperlipidemia LDL goal <70   Severe sepsis (HCC)   NSTEMI (non-ST elevated myocardial infarction) (HCC)   Hyperglycemia   SOB (shortness of breath)   Acute respiratory failure (HCC)   HFrEF (heart failure with reduced ejection fraction) (HCC)   Acute on chronic HFrEF (heart failure with reduced ejection fraction) (HCC)   SVT (supraventricular tachycardia) (HCC)   Acute combined systolic and diastolic heart failure (HCC)   Pure hypercholesterolemia  Septic shock Secondary to ESBL Klebsiella bacteremia Unclear source of bacteremia Urinalysis clean MRI total spine negative for abscess Was seemingly responding to IV ceftriaxone/cefepime Deteriorated on 5/24 Placed back on Levophed PCCM/ID following No indication for TEE at this point Plan: Wean off levophed as tolerated Continue meropenem with pharmacy dosing for total of 7 days Monitor vitals and fever curve Continue stepdown status for now High risk for deterioration  Acute hypoxic respiratory failure  Acute COPD Exacerbation/left lung multifocal pneumonia Flash pulmonary edema on 4/30 -Intubated for flash pulmonary edema on, extubated 5/7 -Treated with diuretics as well, now off -Improved and stable, weaned off O2 -Discharge planning, CIR declined -Now bed search initiated for skilled nursing facility -[facility] aware and is following for placement options -Patient has had a complicated hospital course and is significantly debilitated.  In my opinion she is unsafe to return home.  She does have recovery potential and would benefit from standard  short-term rehab.   Per case management appeal process has been initiated. -Appeal has been upheld by [employer].  Will pursue skilled nursing facility at time of discharge.  Disposition plan pending  Sepsis, left lower lobe pneumonia Dysphagia -tracheal aspirate on 4/30 with KLEBSIELLA PNEUMONIAE (resistant to Ampicillin) -Completed 7 days of antibiotics. -Improving -SLP following, continues to have dysphagia, currently on dysphagia 3, honey thick liquids -Will need close SLP FU at rehab to monitor for continued improvement and changes to diet -Currently on honey thick liquids.  Intermittently tolerating p.o. intake.  Acute systolic congestive heart failure/Flash pulmonary edema NSTEMI Paroxysmal SVT -Echo noted with drop in EF down to 25-30%  -Cardiology consulted, left heart cath noted nonobstructive coronary disease, recommended medical management and repeat echo in 2 to 3 months -Continueaspirin, Plavix. Metoprolol/Entresto currently on hold due to low BP -Follow-up with Dr. Irish Lack in 2 to 3 weeks  Acute metabolic/toxic encephalopathy -ICU stay complicated by severe encephalopathy while on vent -At baseline on buprenorphine/naloxone which was held on admission, now resumed -Also had an MRI brain which was unremarkable -Improved and stable now, PT OT as tolerated   COPD Ongoing tobacco abuse -Counseled, tapered off prednisone, continue duo nebs   DVT prophylaxis: SQ Lovenox Code Status: Full Family Communication: updated patient son at the bedside Disposition Plan: Status is: Inpatient  Remains inpatient appropriate because:Inpatient level of care appropriate due to severity of illness   Dispo: The patient is from: Home              Anticipated d/c is to: SNF              Patient currently is not medically stable to d/c.   Difficult to place patient No   Current sepsis/septic shock secondary to ESBL Klebsiella bacteremia.  Currently in stepdown status on vasopressor support.  PCCM  consulted.  Disposition plan pending.  Level ofcare: Stepdown  Consultants:   Palliative care  PCCM  Cardiology  ID  Procedures:   None  Antimicrobials:   Meropenem 5/24>   Subjective: She says she feels better today. No nausea or vomiting. She ate a small amount of breakfast. No shortness of breath or cough  Objective: Vitals:   10/06/20 0815 10/06/20 0830 10/06/20 0845 10/06/20 0900  BP: (!) 118/50 (!) 104/48 (!) 118/49 114/89  Pulse: (!) 101 (!) 102 (!) 106 (!) 118  Resp: 15 15 16 18   Temp:      TempSrc:      SpO2: 98% 98% 97% 97%  Weight:      Height:        Intake/Output Summary (Last 24 hours) at 10/06/2020 1235 Last data filed at 10/06/2020 0900 Gross per 24 hour  Intake 1410.37 ml  Output 600 ml  Net 810.37 ml   Filed Weights   10/04/20 0437 10/05/20 0500 10/06/20 0500  Weight: 54.9 kg 54.6 kg 54.6 kg    Examination:  General exam: Alert, awake, oriented x 3 Respiratory system: Clear to auscultation. Respiratory effort normal. Cardiovascular system:RRR. No murmurs, rubs, gallops. Gastrointestinal system: Abdomen is nondistended, soft and nontender. No organomegaly or masses felt. Normal bowel sounds heard. Central nervous system: Alert and oriented. No focal neurological deficits. Extremities: No C/C/E, +pedal pulses Skin: No rashes, lesions or ulcers Psychiatry: Judgement and insight appear normal. Mood & affect appropriate.    Data Reviewed: I have personally reviewed following labs and imaging studies  CBC: Recent Labs  Lab 10/02/20 1823 10/03/20 0160 10/04/20 0407 10/05/20 0325 10/06/20  0413  WBC 14.8* 8.6 5.5 5.6 11.2*  NEUTROABS 12.9* 6.4 3.4 4.0 7.4  HGB 12.2 11.6* 10.4* 10.0* 10.6*  HCT 37.0 35.8* 32.5* 30.7* 32.7*  MCV 92.0 93.5 94.5 93.6 93.2  PLT 297 305 270 211 426   Basic Metabolic Panel: Recent Labs  Lab 10/02/20 1736 10/03/20 0822 10/04/20 0407 10/05/20 0325 10/06/20 0413  NA 134* 138 141 134* 139  K 3.9 3.5  3.5 4.1 4.3  CL 99 105 108 101 101  CO2 26 25 26 26 29   GLUCOSE 142* 136* 123* 101* 117*  BUN 12 10 8 10 12   CREATININE 0.58 0.44 0.44 0.49 0.66  CALCIUM 8.5* 8.4* 8.4* 8.2* 8.4*  MG  --  1.7 1.8 2.1 2.3  PHOS  --  3.3 2.9 2.5 3.5   GFR: Estimated Creatinine Clearance: 45.8 mL/min (by C-G formula based on SCr of 0.66 mg/dL). Liver Function Tests: Recent Labs  Lab 10/02/20 1736 10/03/20 0822  AST 27  --   ALT 28  --   ALKPHOS 77  --   BILITOT 0.6  --   PROT 6.4*  --   ALBUMIN 2.8* 2.5*   No results for input(s): LIPASE, AMYLASE in the last 168 hours. No results for input(s): AMMONIA in the last 168 hours. Coagulation Profile: Recent Labs  Lab 10/02/20 1736  INR 1.4*   Cardiac Enzymes: No results for input(s): CKTOTAL, CKMB, CKMBINDEX, TROPONINI in the last 168 hours. BNP (last 3 results) No results for input(s): PROBNP in the last 8760 hours. HbA1C: No results for input(s): HGBA1C in the last 72 hours. CBG: Recent Labs  Lab 10/05/20 1925 10/05/20 2340 10/06/20 0402 10/06/20 0801 10/06/20 1207  GLUCAP 171* 126* 105* 145* 138*   Lipid Profile: No results for input(s): CHOL, HDL, LDLCALC, TRIG, CHOLHDL, LDLDIRECT in the last 72 hours. Thyroid Function Tests: No results for input(s): TSH, T4TOTAL, FREET4, T3FREE, THYROIDAB in the last 72 hours. Anemia Panel: No results for input(s): VITAMINB12, FOLATE, FERRITIN, TIBC, IRON, RETICCTPCT in the last 72 hours. Sepsis Labs: Recent Labs  Lab 10/02/20 1736 10/02/20 1824 10/02/20 2130 10/06/20 0413  PROCALCITON 1.93  --   --  39.42  LATICACIDVEN  --  2.4* 1.1  --     Recent Results (from the past 240 hour(s))  Culture, blood (x 2)     Status: Abnormal   Collection Time: 10/02/20  6:24 PM   Specimen: BLOOD  Result Value Ref Range Status   Specimen Description   Final    BLOOD BLOOD RIGHT HAND Performed at [facility], [address], [place], [place] [address]    Special Requests   Final     BOTTLES DRAWN AEROBIC AND ANAEROBIC Blood Culture adequate volume Performed at [facility], [address], [place], [place] [address]    Culture  Setup Time   Final    GRAM NEGATIVE RODS IN BOTH AEROBIC AND ANAEROBIC BOTTLES CRITICAL RESULT CALLED TO, READ BACK BY AND VERIFIED WITH: NATHAN BELUE AT [place] 10/03/2020 [place]. Performed at [facility], [place] [address] [place], [place] [address]    Culture (A)  Final    KLEBSIELLA PNEUMONIAE Confirmed Extended Spectrum Beta-Lactamase Producer (ESBL).  In bloodstream infections from ESBL organisms, carbapenems are preferred over piperacillin/tazobactam. They are shown to have a lower risk of mortality.    Report Status 10/05/2020 FINAL  Final   Organism ID, Bacteria KLEBSIELLA PNEUMONIAE  Final      Susceptibility  Klebsiella pneumoniae - MIC*    AMPICILLIN >=32 RESISTANT Resistant     CEFAZOLIN >=64 RESISTANT Resistant     CEFEPIME 8 INTERMEDIATE Intermediate     CEFTAZIDIME >=64 RESISTANT Resistant     CEFTRIAXONE >=64 RESISTANT Resistant     CIPROFLOXACIN <=0.25 SENSITIVE Sensitive     GENTAMICIN <=1 SENSITIVE Sensitive     IMIPENEM <=0.25 SENSITIVE Sensitive     TRIMETH/SULFA <=20 SENSITIVE Sensitive     AMPICILLIN/SULBACTAM >=32 RESISTANT Resistant     PIP/TAZO >=128 RESISTANT Resistant     * KLEBSIELLA PNEUMONIAE  Blood Culture ID Panel (Reflexed)     Status: Abnormal   Collection Time: 10/02/20  6:24 PM  Result Value Ref Range Status   Enterococcus faecalis NOT DETECTED NOT DETECTED Final   Enterococcus Faecium NOT DETECTED NOT DETECTED Final   Listeria monocytogenes NOT DETECTED NOT DETECTED Final   Staphylococcus species NOT DETECTED NOT DETECTED Final   Staphylococcus aureus (BCID) NOT DETECTED NOT DETECTED Final   Staphylococcus epidermidis NOT DETECTED NOT DETECTED Final   Staphylococcus lugdunensis NOT DETECTED NOT DETECTED Final   Streptococcus species NOT DETECTED NOT DETECTED Final   Streptococcus  agalactiae NOT DETECTED NOT DETECTED Final   Streptococcus pneumoniae NOT DETECTED NOT DETECTED Final   Streptococcus pyogenes NOT DETECTED NOT DETECTED Final   A.calcoaceticus-baumannii NOT DETECTED NOT DETECTED Final   Bacteroides fragilis NOT DETECTED NOT DETECTED Final   Enterobacterales DETECTED (A) NOT DETECTED Final    Comment: Enterobacterales represent a large order of gram negative bacteria, not a single organism. CRITICAL RESULT CALLED TO, READ BACK BY AND VERIFIED WITH: NATHAN BELUE AT 0708 ON 10/03/2020 [place].    Enterobacter cloacae complex NOT DETECTED NOT DETECTED Final   Escherichia coli NOT DETECTED NOT DETECTED Final   Klebsiella aerogenes NOT DETECTED NOT DETECTED Final   Klebsiella oxytoca NOT DETECTED NOT DETECTED Final   Klebsiella pneumoniae DETECTED (A) NOT DETECTED Corrected    Comment: CORRECTED ON 05/22 AT 0825: PREVIOUSLY REPORTED AS DETECTED CRITICAL RESULT CALLED TO, READ BACK BY AND VERIFIED WITH: NATHAN BELUE AT 0708 ON 10/03/2020 [place].   Proteus species NOT DETECTED NOT DETECTED Final   Salmonella species NOT DETECTED NOT DETECTED Final   Serratia marcescens NOT DETECTED NOT DETECTED Final   Haemophilus influenzae NOT DETECTED NOT DETECTED Final   Neisseria meningitidis NOT DETECTED NOT DETECTED Final   Pseudomonas aeruginosa NOT DETECTED NOT DETECTED Final   Stenotrophomonas maltophilia NOT DETECTED NOT DETECTED Final   Candida albicans NOT DETECTED NOT DETECTED Final   Candida auris NOT DETECTED NOT DETECTED Final   Candida glabrata NOT DETECTED NOT DETECTED Final   Candida krusei NOT DETECTED NOT DETECTED Final   Candida parapsilosis NOT DETECTED NOT DETECTED Final   Candida tropicalis NOT DETECTED NOT DETECTED Final   Cryptococcus neoformans/gattii NOT DETECTED NOT DETECTED Final   CTX-M ESBL NOT DETECTED NOT DETECTED Final   Carbapenem resistance IMP NOT DETECTED NOT DETECTED Final   Carbapenem resistance KPC NOT DETECTED NOT DETECTED Final    Carbapenem resistance NDM NOT DETECTED NOT DETECTED Final   Carbapenem resist OXA 48 LIKE NOT DETECTED NOT DETECTED Final   Carbapenem resistance VIM NOT DETECTED NOT DETECTED Final    Comment: Performed at [facility], [address] [place], [place] [address]  Culture, blood (x 2)     Status: Abnormal   Collection Time: 10/02/20  6:32 PM   Specimen: BLOOD  Result Value Ref Range Status   Specimen Description  Final    BLOOD RIGHT ANTECUBITAL Performed at [facility], [address] [place], [place] [address]    Special Requests   Final    BOTTLES DRAWN AEROBIC AND ANAEROBIC Blood Culture results may not be optimal due to an excessive volume of blood received in culture bottles Performed at [facility], [address] [place], [place] [address]    Culture  Setup Time   Final    GRAM NEGATIVE RODS IN BOTH AEROBIC AND ANAEROBIC BOTTLES CRITICAL VALUE NOTED.  VALUE IS CONSISTENT WITH PREVIOUSLY REPORTED AND CALLED VALUE. Performed at [facility], [address] [place], [place] [address]    Culture (A)  Final    KLEBSIELLA PNEUMONIAE SUSCEPTIBILITIES PERFORMED ON PREVIOUS CULTURE WITHIN THE LAST 5 DAYS. Performed at [facility], [place] [address] [place], [place] [address]    Report Status 10/05/2020 FINAL  Final  Culture, Urine     Status: None   Collection Time: 10/03/20  8:52 AM   Specimen: Urine, Clean Catch  Result Value Ref Range Status   Specimen Description   Final    URINE, CLEAN CATCH Performed at [facility], [address] [place], [place] [address]    Special Requests   Final    NONE Performed at [facility], [address] [place], [place] [address]    Culture   Final    NO GROWTH Performed at [facility], [place] [address], [place], [place] [address]    Report Status 10/04/2020 FINAL  Final         Radiology Studies: MR CERVICAL SPINE W WO CONTRAST  Result Date:  10/05/2020 CLINICAL DATA:  Initial evaluation for epidural abscess. Klebsiella bacteremia. EXAM: MRI CERVICAL, THORACIC AND LUMBAR SPINE WITHOUT AND WITH CONTRAST TECHNIQUE: Multiplanar and multiecho pulse sequences of the cervical spine, to include the craniocervical junction and cervicothoracic junction, and thoracic and lumbar spine, were obtained without and with intravenous contrast. CONTRAST:  70mL GADAVIST GADOBUTROL 1 MMOL/ML IV SOLN COMPARISON:  None available. FINDINGS: MRI CERVICAL SPINE FINDINGS Alignment: Straightening of the normal cervical lordosis. No listhesis. Vertebrae: Vertebral body height maintained without acute or chronic fracture. Bone marrow signal intensity within normal limits. Small benign hemangioma noted within the C5 vertebral body. No worrisome osseous lesions. No evidence for osteomyelitis discitis or septic arthritis. Cord: Normal signal and morphology. No epidural abscess or other collection. No abnormal enhancement. Posterior Fossa, vertebral arteries, paraspinal tissues: Chronic microvascular ischemic changes noted within the visualized pons/brainstem. Partially empty sella noted. Craniocervical junction within normal limits. Paraspinous and prevertebral soft tissues normal. Normal flow voids seen within the vertebral arteries bilaterally. Disc levels: C2-C3: Mild disc bulge with right-sided uncovertebral and facet hypertrophy. No spinal stenosis. Moderate right C3 foraminal narrowing. C3-C4: Small central disc protrusion with resultant mild spinal stenosis, but no cord impingement. Superimposed mild uncovertebral and facet hypertrophy without significant foraminal stenosis. C4-C5: Small central disc protrusion with resultant mild spinal stenosis, but no cord impingement. Left-sided facet degeneration. Foramina remain patent. C5-C6: Central disc protrusion indents the ventral thecal sac. Resultant mild spinal stenosis with minimal flattening of the ventral cord. Left-sided  uncovertebral and facet hypertrophy. No significant foraminal encroachment. C6-C7: Minimal disc bulge. No significant spinal stenosis. Foramina remain patent. C7-T1: Negative interspace. Left-sided facet hypertrophy. No significant stenosis. MRI THORACIC SPINE FINDINGS Alignment: Physiologic with preservation of the normal thoracic kyphosis. No listhesis. Vertebrae: Vertebral body height maintained without acute or chronic fracture. Bone marrow signal  intensity mildly heterogeneous but overall within normal limits. No worrisome osseous lesions. No signal changes to suggest osteomyelitis discitis or septic arthritis. Cord: Normal signal and morphology. No epidural abscess or other collection. No abnormal enhancement. Paraspinal and other soft tissues: Paraspinous soft tissues demonstrate no acute finding. Small layering bilateral pleural effusions, left slightly larger than right. Small benign appearing simple cyst partially visualized at the upper pole the left kidney. Visualized visceral structures otherwise unremarkable. Disc levels: T6-7: Central disc osteophyte complex indents the ventral thecal sac (series 22, image 20). Secondary mild flattening of the ventral cord without cord signal changes. Thecal sac fairly capacious at this level without significant spinal stenosis. Foramina remain patent. T7-8: Lobulated central disc protrusion indents the ventral thecal sac, slightly asymmetric to the right. Flattening of the ventral cord, worse on the right. No cord signal changes or significant spinal stenosis. Foramina remain patent. Otherwise, normal for age multilevel degenerative disc disease without significant canal or foraminal stenosis. No other significant focal disc herniation. MRI LUMBAR SPINE FINDINGS Segmentation: Standard. Lowest well-formed disc space labeled the L5-S1 level. Alignment: Trace retrolisthesis of L2 on L3, with 2 mm anterolisthesis of L4 on L5 and L5 on S1, chronic and facet mediated.  Alignment otherwise normal preservation of the normal lumbar lordosis. Vertebrae: Vertebral body height maintained without acute or chronic fracture. Bone marrow signal intensity mildly heterogeneous but overall within normal limits. No worrisome osseous lesions. No findings to suggest osteomyelitis discitis. Mild reactive endplate change about the T11-12 and T12-L1 interspace anteriorly felt to be consistent with degenerative change. No findings to suggest septic arthritis. Conus medullaris and cauda equina: Conus extends to the L1 level. Conus and cauda equina appear normal. No epidural abscess or other collection. No abnormal enhancement. Paraspinal and other soft tissues: Minimal edema and enhancement within the lower posterior paraspinous musculature, nonspecific, but could reflect changes of muscular injury and/or strain. Possible acute myositis could also have this appearance. No discrete collections. Paraspinous soft tissues demonstrate no other acute finding. 1 cm simple cyst present at the upper pole the left kidney. Visualized visceral structures otherwise unremarkable. Disc levels: L1-2:  Unremarkable. L2-3: Trace retrolisthesis. Minimal disc bulge with facet hypertrophy. No stenosis. L3-4: Minimal disc bulge. Mild bilateral facet hypertrophy. No significant canal or foraminal stenosis. L4-5: 2 mm anterolisthesis. Diffuse disc bulge with disc desiccation. Moderate bilateral facet hypertrophy. Associated small bilateral joint effusions. Resultant mild-to-moderate canal with bilateral lateral recess stenosis. Mild left L4 foraminal narrowing. L5-S1: Trace anterolisthesis. Disc desiccation with minimal disc bulge. Moderate right worse than left facet hypertrophy with associated small joint effusions. No canal or lateral recess stenosis. Foramina remain patent. IMPRESSION: 1. No MRI evidence for osteomyelitis discitis or septic arthritis within the cervical, thoracic, or lumbar spine. No epidural abscess or  other collection. 2. Mild edema and enhancement within the lower posterior paraspinous musculature, nonspecific, but could reflect changes of mild muscular injury and/or strain. Possible acute myositis could also have this appearance. No discrete collections. 3. Mild multilevel cervical spondylosis with resultant mild spinal stenosis at C3-4 through C5-6. 4. Degenerative spondylosis at T6-7 and T7-8 with associated cord flattening, but no significant spinal stenosis. 5. Mild for age multilevel lumbar spondylosis, most pronounced at L4-5 where there is resultant mild-to-moderate bilateral lateral recess stenosis. No frank impingement. 6. Small layering bilateral pleural effusions, left greater than right. Electronically Signed   By: Jeannine Boga M.D.   On: 10/05/2020 03:45   MR THORACIC SPINE W WO CONTRAST  Result Date:  10/05/2020 CLINICAL DATA:  Initial evaluation for epidural abscess. Klebsiella bacteremia. EXAM: MRI CERVICAL, THORACIC AND LUMBAR SPINE WITHOUT AND WITH CONTRAST TECHNIQUE: Multiplanar and multiecho pulse sequences of the cervical spine, to include the craniocervical junction and cervicothoracic junction, and thoracic and lumbar spine, were obtained without and with intravenous contrast. CONTRAST:  88mL GADAVIST GADOBUTROL 1 MMOL/ML IV SOLN COMPARISON:  None available. FINDINGS: MRI CERVICAL SPINE FINDINGS Alignment: Straightening of the normal cervical lordosis. No listhesis. Vertebrae: Vertebral body height maintained without acute or chronic fracture. Bone marrow signal intensity within normal limits. Small benign hemangioma noted within the C5 vertebral body. No worrisome osseous lesions. No evidence for osteomyelitis discitis or septic arthritis. Cord: Normal signal and morphology. No epidural abscess or other collection. No abnormal enhancement. Posterior Fossa, vertebral arteries, paraspinal tissues: Chronic microvascular ischemic changes noted within the visualized pons/brainstem.  Partially empty sella noted. Craniocervical junction within normal limits. Paraspinous and prevertebral soft tissues normal. Normal flow voids seen within the vertebral arteries bilaterally. Disc levels: C2-C3: Mild disc bulge with right-sided uncovertebral and facet hypertrophy. No spinal stenosis. Moderate right C3 foraminal narrowing. C3-C4: Small central disc protrusion with resultant mild spinal stenosis, but no cord impingement. Superimposed mild uncovertebral and facet hypertrophy without significant foraminal stenosis. C4-C5: Small central disc protrusion with resultant mild spinal stenosis, but no cord impingement. Left-sided facet degeneration. Foramina remain patent. C5-C6: Central disc protrusion indents the ventral thecal sac. Resultant mild spinal stenosis with minimal flattening of the ventral cord. Left-sided uncovertebral and facet hypertrophy. No significant foraminal encroachment. C6-C7: Minimal disc bulge. No significant spinal stenosis. Foramina remain patent. C7-T1: Negative interspace. Left-sided facet hypertrophy. No significant stenosis. MRI THORACIC SPINE FINDINGS Alignment: Physiologic with preservation of the normal thoracic kyphosis. No listhesis. Vertebrae: Vertebral body height maintained without acute or chronic fracture. Bone marrow signal intensity mildly heterogeneous but overall within normal limits. No worrisome osseous lesions. No signal changes to suggest osteomyelitis discitis or septic arthritis. Cord: Normal signal and morphology. No epidural abscess or other collection. No abnormal enhancement. Paraspinal and other soft tissues: Paraspinous soft tissues demonstrate no acute finding. Small layering bilateral pleural effusions, left slightly larger than right. Small benign appearing simple cyst partially visualized at the upper pole the left kidney. Visualized visceral structures otherwise unremarkable. Disc levels: T6-7: Central disc osteophyte complex indents the ventral  thecal sac (series 22, image 20). Secondary mild flattening of the ventral cord without cord signal changes. Thecal sac fairly capacious at this level without significant spinal stenosis. Foramina remain patent. T7-8: Lobulated central disc protrusion indents the ventral thecal sac, slightly asymmetric to the right. Flattening of the ventral cord, worse on the right. No cord signal changes or significant spinal stenosis. Foramina remain patent. Otherwise, normal for age multilevel degenerative disc disease without significant canal or foraminal stenosis. No other significant focal disc herniation. MRI LUMBAR SPINE FINDINGS Segmentation: Standard. Lowest well-formed disc space labeled the L5-S1 level. Alignment: Trace retrolisthesis of L2 on L3, with 2 mm anterolisthesis of L4 on L5 and L5 on S1, chronic and facet mediated. Alignment otherwise normal preservation of the normal lumbar lordosis. Vertebrae: Vertebral body height maintained without acute or chronic fracture. Bone marrow signal intensity mildly heterogeneous but overall within normal limits. No worrisome osseous lesions. No findings to suggest osteomyelitis discitis. Mild reactive endplate change about the T11-12 and T12-L1 interspace anteriorly felt to be consistent with degenerative change. No findings to suggest septic arthritis. Conus medullaris and cauda equina: Conus extends to the L1 level. Conus and  cauda equina appear normal. No epidural abscess or other collection. No abnormal enhancement. Paraspinal and other soft tissues: Minimal edema and enhancement within the lower posterior paraspinous musculature, nonspecific, but could reflect changes of muscular injury and/or strain. Possible acute myositis could also have this appearance. No discrete collections. Paraspinous soft tissues demonstrate no other acute finding. 1 cm simple cyst present at the upper pole the left kidney. Visualized visceral structures otherwise unremarkable. Disc levels:  L1-2:  Unremarkable. L2-3: Trace retrolisthesis. Minimal disc bulge with facet hypertrophy. No stenosis. L3-4: Minimal disc bulge. Mild bilateral facet hypertrophy. No significant canal or foraminal stenosis. L4-5: 2 mm anterolisthesis. Diffuse disc bulge with disc desiccation. Moderate bilateral facet hypertrophy. Associated small bilateral joint effusions. Resultant mild-to-moderate canal with bilateral lateral recess stenosis. Mild left L4 foraminal narrowing. L5-S1: Trace anterolisthesis. Disc desiccation with minimal disc bulge. Moderate right worse than left facet hypertrophy with associated small joint effusions. No canal or lateral recess stenosis. Foramina remain patent. IMPRESSION: 1. No MRI evidence for osteomyelitis discitis or septic arthritis within the cervical, thoracic, or lumbar spine. No epidural abscess or other collection. 2. Mild edema and enhancement within the lower posterior paraspinous musculature, nonspecific, but could reflect changes of mild muscular injury and/or strain. Possible acute myositis could also have this appearance. No discrete collections. 3. Mild multilevel cervical spondylosis with resultant mild spinal stenosis at C3-4 through C5-6. 4. Degenerative spondylosis at T6-7 and T7-8 with associated cord flattening, but no significant spinal stenosis. 5. Mild for age multilevel lumbar spondylosis, most pronounced at L4-5 where there is resultant mild-to-moderate bilateral lateral recess stenosis. No frank impingement. 6. Small layering bilateral pleural effusions, left greater than right. Electronically Signed   By: Jeannine Boga M.D.   On: 10/05/2020 03:45   MR Lumbar Spine W Wo Contrast  Result Date: 10/05/2020 CLINICAL DATA:  Initial evaluation for epidural abscess. Klebsiella bacteremia. EXAM: MRI CERVICAL, THORACIC AND LUMBAR SPINE WITHOUT AND WITH CONTRAST TECHNIQUE: Multiplanar and multiecho pulse sequences of the cervical spine, to include the craniocervical  junction and cervicothoracic junction, and thoracic and lumbar spine, were obtained without and with intravenous contrast. CONTRAST:  74mL GADAVIST GADOBUTROL 1 MMOL/ML IV SOLN COMPARISON:  None available. FINDINGS: MRI CERVICAL SPINE FINDINGS Alignment: Straightening of the normal cervical lordosis. No listhesis. Vertebrae: Vertebral body height maintained without acute or chronic fracture. Bone marrow signal intensity within normal limits. Small benign hemangioma noted within the C5 vertebral body. No worrisome osseous lesions. No evidence for osteomyelitis discitis or septic arthritis. Cord: Normal signal and morphology. No epidural abscess or other collection. No abnormal enhancement. Posterior Fossa, vertebral arteries, paraspinal tissues: Chronic microvascular ischemic changes noted within the visualized pons/brainstem. Partially empty sella noted. Craniocervical junction within normal limits. Paraspinous and prevertebral soft tissues normal. Normal flow voids seen within the vertebral arteries bilaterally. Disc levels: C2-C3: Mild disc bulge with right-sided uncovertebral and facet hypertrophy. No spinal stenosis. Moderate right C3 foraminal narrowing. C3-C4: Small central disc protrusion with resultant mild spinal stenosis, but no cord impingement. Superimposed mild uncovertebral and facet hypertrophy without significant foraminal stenosis. C4-C5: Small central disc protrusion with resultant mild spinal stenosis, but no cord impingement. Left-sided facet degeneration. Foramina remain patent. C5-C6: Central disc protrusion indents the ventral thecal sac. Resultant mild spinal stenosis with minimal flattening of the ventral cord. Left-sided uncovertebral and facet hypertrophy. No significant foraminal encroachment. C6-C7: Minimal disc bulge. No significant spinal stenosis. Foramina remain patent. C7-T1: Negative interspace. Left-sided facet hypertrophy. No significant stenosis. MRI THORACIC SPINE FINDINGS  Alignment: Physiologic with preservation of the normal thoracic kyphosis. No listhesis. Vertebrae: Vertebral body height maintained without acute or chronic fracture. Bone marrow signal intensity mildly heterogeneous but overall within normal limits. No worrisome osseous lesions. No signal changes to suggest osteomyelitis discitis or septic arthritis. Cord: Normal signal and morphology. No epidural abscess or other collection. No abnormal enhancement. Paraspinal and other soft tissues: Paraspinous soft tissues demonstrate no acute finding. Small layering bilateral pleural effusions, left slightly larger than right. Small benign appearing simple cyst partially visualized at the upper pole the left kidney. Visualized visceral structures otherwise unremarkable. Disc levels: T6-7: Central disc osteophyte complex indents the ventral thecal sac (series 22, image 20). Secondary mild flattening of the ventral cord without cord signal changes. Thecal sac fairly capacious at this level without significant spinal stenosis. Foramina remain patent. T7-8: Lobulated central disc protrusion indents the ventral thecal sac, slightly asymmetric to the right. Flattening of the ventral cord, worse on the right. No cord signal changes or significant spinal stenosis. Foramina remain patent. Otherwise, normal for age multilevel degenerative disc disease without significant canal or foraminal stenosis. No other significant focal disc herniation. MRI LUMBAR SPINE FINDINGS Segmentation: Standard. Lowest well-formed disc space labeled the L5-S1 level. Alignment: Trace retrolisthesis of L2 on L3, with 2 mm anterolisthesis of L4 on L5 and L5 on S1, chronic and facet mediated. Alignment otherwise normal preservation of the normal lumbar lordosis. Vertebrae: Vertebral body height maintained without acute or chronic fracture. Bone marrow signal intensity mildly heterogeneous but overall within normal limits. No worrisome osseous lesions. No  findings to suggest osteomyelitis discitis. Mild reactive endplate change about the T11-12 and T12-L1 interspace anteriorly felt to be consistent with degenerative change. No findings to suggest septic arthritis. Conus medullaris and cauda equina: Conus extends to the L1 level. Conus and cauda equina appear normal. No epidural abscess or other collection. No abnormal enhancement. Paraspinal and other soft tissues: Minimal edema and enhancement within the lower posterior paraspinous musculature, nonspecific, but could reflect changes of muscular injury and/or strain. Possible acute myositis could also have this appearance. No discrete collections. Paraspinous soft tissues demonstrate no other acute finding. 1 cm simple cyst present at the upper pole the left kidney. Visualized visceral structures otherwise unremarkable. Disc levels: L1-2:  Unremarkable. L2-3: Trace retrolisthesis. Minimal disc bulge with facet hypertrophy. No stenosis. L3-4: Minimal disc bulge. Mild bilateral facet hypertrophy. No significant canal or foraminal stenosis. L4-5: 2 mm anterolisthesis. Diffuse disc bulge with disc desiccation. Moderate bilateral facet hypertrophy. Associated small bilateral joint effusions. Resultant mild-to-moderate canal with bilateral lateral recess stenosis. Mild left L4 foraminal narrowing. L5-S1: Trace anterolisthesis. Disc desiccation with minimal disc bulge. Moderate right worse than left facet hypertrophy with associated small joint effusions. No canal or lateral recess stenosis. Foramina remain patent. IMPRESSION: 1. No MRI evidence for osteomyelitis discitis or septic arthritis within the cervical, thoracic, or lumbar spine. No epidural abscess or other collection. 2. Mild edema and enhancement within the lower posterior paraspinous musculature, nonspecific, but could reflect changes of mild muscular injury and/or strain. Possible acute myositis could also have this appearance. No discrete collections. 3. Mild  multilevel cervical spondylosis with resultant mild spinal stenosis at C3-4 through C5-6. 4. Degenerative spondylosis at T6-7 and T7-8 with associated cord flattening, but no significant spinal stenosis. 5. Mild for age multilevel lumbar spondylosis, most pronounced at L4-5 where there is resultant mild-to-moderate bilateral lateral recess stenosis. No frank impingement. 6. Small layering bilateral pleural effusions, left greater than right. Electronically  Signed   By: Jeannine Boga M.D.   On: 10/05/2020 03:45   DG Chest Port 1 View  Result Date: 10/05/2020 CLINICAL DATA:  75 year old female with shortness of breath. EXAM: PORTABLE CHEST - 1 VIEW COMPARISON:  10/02/2020, 09/21/2020 FINDINGS: The mediastinal contours are within normal limits. No cardiomegaly. Atherosclerotic calcification of the aortic arch. Coronary stent in place. Mild bibasilar hazy opacities, similar to comparison. No significant pleural effusion, focal consolidation, or pneumothorax. Enteric radiopaque material visualized near the splenic flexure. No acute osseous abnormality. IMPRESSION: Unchanged faint bibasilar hazy opacities, likely representing resolving pneumonia. No new or worsening opacities. Aortic Atherosclerosis (ICD10-I70.0). Electronically Signed   By: Ruthann Cancer MD   On: 10/05/2020 10:34        Scheduled Meds: . aspirin  81 mg Oral QHS  . buprenorphine-naloxone  2 tablet Sublingual Daily  . Chlorhexidine Gluconate Cloth  6 each Topical QHS  . clopidogrel  75 mg Oral QHS  . fluticasone  1 spray Each Nare Daily  . insulin aspart  0-15 Units Subcutaneous Q4H  . levothyroxine  88 mcg Oral Q0600  . loratadine  10 mg Oral Daily  . mouth rinse  15 mL Mouth Rinse BID  . metoprolol succinate  50 mg Oral Daily  . midodrine  10 mg Oral TID WC  . mometasone-formoterol  2 puff Inhalation BID  . multivitamin with minerals  1 tablet Oral Daily  . pantoprazole  40 mg Oral Daily  . rosuvastatin  40 mg Oral QHS   . sodium chloride flush  3 mL Intravenous Q12H  . sodium chloride flush  3 mL Intravenous Q12H   Continuous Infusions: . sodium chloride Stopped (09/24/20 1321)  . sodium chloride Stopped (10/05/20 2214)  . meropenem (MERREM) IV 1 g (10/05/20 2135)  . norepinephrine (LEVOPHED) Adult infusion 5 mcg/min (10/06/20 0900)     LOS: 26 days    Time spent: 35 minutes    Kathie Dike, MD [facility]  If 7PM-7AM, please contact night-coverage 10/06/2020, 12:35 PM

## 2020-10-06 NOTE — TOC Progression Note (Signed)
Transition of Care [facility]) - Progression Note    Patient Details  Name: ALEIDA CRANDELL MRN: [id number] Date of Birth: 1945/09/13  Transition of Care [facility]) CM/SW [place], [place] Phone Number: [phone] 10/06/2020, 2:13 PM  Clinical Narrative:     Patient remain critically ill, deteriorated yesterday 5/24 and remain in the ICU.  Patient has SNF bed offers but is not medically ready for discharge. Patient met with Palliative Care NP yesterday 5/24. Patient does not want care/discharge conversations shred with daughters or son.  Patient is aware and has verbalized understanding if she is unable make care decisions her daughters and son will make care decisions for her, unless she had a designated Media planner.  TOC will continue to follow.    Barriers to Discharge: Continued Medical Work up  Expected Discharge Plan and Services   In-house Referral: Clinical Social Work   Post Acute Care Choice:  [facility]) Living arrangements for the past 2 months: Single Family Home                                       Social Determinants of Health (SDOH) Interventions    Readmission Risk Interventions No flowsheet data found.

## 2020-10-06 NOTE — Plan of Care (Signed)
Patient alert and oriented x 4. Patient states that her pain is a 0 on a scale of 0 to 10. Patient states that she is feeling much better than she did yesterday morning. Patient and daughters stated that they do not want to see palliative again due to the conversation palliative had with the family. Explained to the family that I would make a note of it and pass their request along to day shift. Patient has remained afebrile. Patients heart rate has maintained in normal sinus rhythm. Patients MAP goal is 65. Patient has been able to maintain her MAP goal on and off. Increased patients levo to 7 in order to maintain the patients MAP goal. Patient is now resting. Will continue patient care.   Problem: Health Behavior/Discharge Planning: Goal: Ability to manage health-related needs will improve 10/06/2020 0512 by Regenia Skeeter, RN Outcome: Progressing 10/06/2020 0511 by Regenia Skeeter, RN Outcome: Progressing   Problem: Activity: Goal: Risk for activity intolerance will decrease 10/06/2020 0512 by Regenia Skeeter, RN Outcome: Progressing 10/06/2020 0511 by Regenia Skeeter, RN Outcome: Progressing   Problem: Elimination: Goal: Will not experience complications related to bowel motility 10/06/2020 0512 by Regenia Skeeter, RN Outcome: Progressing 10/06/2020 0511 by Regenia Skeeter, RN Outcome: Progressing   Problem: Clinical Measurements: Goal: Respiratory complications will improve Outcome: Progressing Goal: Cardiovascular complication will be avoided Outcome: Progressing   Problem: Nutrition: Goal: Adequate nutrition will be maintained Outcome: Progressing   Problem: Clinical Measurements: Goal: Ability to maintain clinical measurements within normal limits will improve Outcome: Not Progressing Goal: Will remain free from infection Outcome: Not Progressing Goal: Diagnostic test results will improve Outcome: Not Progressing   Problem: Skin Integrity: Goal:  Risk for impaired skin integrity will decrease Outcome: Not Progressing

## 2020-10-06 NOTE — Progress Notes (Signed)
Occupational Therapy Treatment Patient Details Name: Kathryn Garrett MRN: [id number] DOB: Sep 19, 1945 Today's Date: 10/06/2020    History of present illness 75 y.o. female with history of CAD with VF arrest s/p PCI to the RCA in 2017 s/p PTCA to the RCA for ISR in 2018, chronic combined systolic and diastolic CHF, COPD with ongoing tobacco use, HTN, HLD, anemia, and narcotic abuse on Suboxone therapy admitted with acute hypoxic respiratory failure requiring mechanical ventilation (extubated 09/17/2020) secondary to COPD exacerbation Klebsiella PNA and acute on chronic combined CHF with transient ST elevation and echo demonstrating new LV dysfunction. Patient with recent transfer to ICU for blood pressure issues requiring pressor support   OT comments  Ms. Pugmire was seen for OT treatment on this date. Upon arrival to room pt awake/alert, in bed with HOB elevated, with son at bedside assisting pt with lunch tray. Son provided max assist for self-feeding. Pt/caregiver educated on importance of pt completing self-care tasks as independently as possible within her capabilities in order to maintain her strength and functional independence upon DC. Pt agreeable to attempting t/f to room recliner. She performs functional tasks as described below (See mobility/ADL sections below for additional detail). Pt performs bed mobility with SBA for mgt of lines/leads. She is able to transfer to room recliner with MIN A for balance as well as SBA for lines/lead mgt. Educated on safety/falls prevention strategies t/o session. BP monitored t/o remains soft however, pt denies any adverse s/s during functional activity. BP at end of session 89/55 (63). RN updated and in room at end of session. Pt continues to benefit from skilled OT services to maximize return to PLOF and minimize risk of future falls, injury, caregiver burden, and readmission. Will continue to follow POC as written. Discharge recommendation remains  appropriate.    Follow Up Recommendations  CIR    Equipment Recommendations  3 in 1 bedside commode;Tub/shower seat    Recommendations for Other Services Rehab consult    Precautions / Restrictions Precautions Precautions: Fall Restrictions Weight Bearing Restrictions: No       Mobility Bed Mobility Overal bed mobility: Needs Assistance Bed Mobility: Supine to Sit     Supine to sit: Supervision;Min guard     General bed mobility comments: SBA for lines/leads mgt.    Transfers Overall transfer level: Needs assistance Equipment used: Rolling walker (2 wheeled) Transfers: Sit to/from Stand Sit to Stand: Min assist Stand pivot transfers: Min assist       General transfer comment: Min A to maintain standing balance during functional transfers.    Balance Overall balance assessment: Needs assistance Sitting-balance support: Feet supported;Single extremity supported Sitting balance-Leahy Scale: Fair Sitting balance - Comments: G static sitting, F dynamic   Standing balance support: Bilateral upper extremity supported;During functional activity Standing balance-Leahy Scale: Fair Standing balance comment: patient relying on rolling walker for support in standing                           ADL either performed or assessed with clinical judgement   ADL Overall ADL's : Needs assistance/impaired Eating/Feeding: Minimal assistance;Set up;Sitting Eating/Feeding Details (indicate cue type and reason): Pt able to peform self-feeding/drinking given set-up assist from lunch tray this date. Requires min A to access items on plate further away from her. Educated on importance of completing ADL tasks including self-feeding as independently as possible to maintain strength and skills for DC.  Functional mobility during ADLs: Minimal assistance;Rolling walker;Cueing for sequencing;Cueing for safety General ADL Comments: Pt  performs bed mobility with SBA for mgt of lines/leads. She is able to transfer to room recliner with MIN A for balance as well as SBA for lines/lead mgt. Educated on safety/falls prevention strategies t/o session. BP monitored t/o remains soft however, pt denies any adverse s/s during functional activity. BP at end of session 89/55 (63).     Vision Baseline Vision/History: Wears glasses Wears Glasses: At all times Patient Visual Report: No change from baseline     Perception     Praxis      Cognition Arousal/Alertness: Awake/alert Behavior During Therapy: WFL for tasks assessed/performed Overall Cognitive Status: Within Functional Limits for tasks assessed                                 General Comments: pt with some slow processing occasionally, some delays with recalling new information. But pt is oriented and apporiate throughout        Exercises Other Exercises Other Exercises: OT facilitates LBD task, self-feeding, sup>sit, sit<>stand, & functional transfer with education on safety, energy conservation, and safe use of AE t/o. Pt with good recall from past sessions.   Shoulder Instructions       General Comments HR reaches 120's during functional mobility.    Pertinent Vitals/ Pain       Faces Pain Scale: Hurts a little bit Pain Location: buttocks Pain Descriptors / Indicators: Sore Pain Intervention(s): Limited activity within patient's tolerance;Monitored during session;Repositioned  Home Living                                          Prior Functioning/Environment              Frequency  Min 3X/week        Progress Toward Goals  OT Goals(current goals can now be found in the care plan section)  Progress towards OT goals: Progressing toward goals  Acute Rehab OT Goals Patient Stated Goal: to get stronger OT Goal Formulation: With patient/family Time For Goal Achievement: 10/18/20 Potential to Achieve Goals: Good   Plan Discharge plan remains appropriate;Frequency remains appropriate    Co-evaluation                 AM-PAC OT "6 Clicks" Daily Activity     Outcome Measure   Help from another person eating meals?: A Little Help from another person taking care of personal grooming?: A Lot Help from another person toileting, which includes using toliet, bedpan, or urinal?: A Little Help from another person bathing (including washing, rinsing, drying)?: A Little Help from another person to put on and taking off regular upper body clothing?: A Little Help from another person to put on and taking off regular lower body clothing?: A Lot 6 Click Score: 16    End of Session Equipment Utilized During Treatment: Rolling walker  OT Visit Diagnosis: Unsteadiness on feet (R26.81);Muscle weakness (generalized) (M62.81);Other symptoms and signs involving cognitive function   Activity Tolerance Patient tolerated treatment well   Patient Left in chair;with call bell/phone within reach;with nursing/sitter in room   Nurse Communication Mobility status        Time: 8295-6213 OT Time Calculation (min): 30 min  Charges: OT General Charges $OT Visit: 1 Visit OT  Treatments $Self Care/Home Management : 8-22 mins $Therapeutic Activity: 8-22 mins  Shara Blazing, M.S., OTR/L Ascom: [phone] 10/06/20, 4:03 PM

## 2020-10-07 DIAGNOSIS — J9601 Acute respiratory failure with hypoxia: Secondary | ICD-10-CM | POA: Diagnosis not present

## 2020-10-07 DIAGNOSIS — J441 Chronic obstructive pulmonary disease with (acute) exacerbation: Secondary | ICD-10-CM | POA: Diagnosis not present

## 2020-10-07 DIAGNOSIS — J189 Pneumonia, unspecified organism: Secondary | ICD-10-CM | POA: Diagnosis not present

## 2020-10-07 DIAGNOSIS — I214 Non-ST elevation (NSTEMI) myocardial infarction: Secondary | ICD-10-CM | POA: Diagnosis not present

## 2020-10-07 LAB — CBC WITH DIFFERENTIAL/PLATELET
Abs Immature Granulocytes: 0.04 10*3/uL (ref 0.00–0.07)
Basophils Absolute: 0.1 10*3/uL (ref 0.0–0.1)
Basophils Relative: 1 %
Eosinophils Absolute: 0.2 10*3/uL (ref 0.0–0.5)
Eosinophils Relative: 3 %
HCT: 29.8 % — ABNORMAL LOW (ref 36.0–46.0)
Hemoglobin: 9.6 g/dL — ABNORMAL LOW (ref 12.0–15.0)
Immature Granulocytes: 1 %
Lymphocytes Relative: 30 %
Lymphs Abs: 2.6 10*3/uL (ref 0.7–4.0)
MCH: 30.1 pg (ref 26.0–34.0)
MCHC: 32.2 g/dL (ref 30.0–36.0)
MCV: 93.4 fL (ref 80.0–100.0)
Monocytes Absolute: 0.9 10*3/uL (ref 0.1–1.0)
Monocytes Relative: 10 %
Neutro Abs: 4.9 10*3/uL (ref 1.7–7.7)
Neutrophils Relative %: 55 %
Platelets: 258 10*3/uL (ref 150–400)
RBC: 3.19 MIL/uL — ABNORMAL LOW (ref 3.87–5.11)
RDW: 14.5 % (ref 11.5–15.5)
Smear Review: NORMAL
WBC: 8.6 10*3/uL (ref 4.0–10.5)
nRBC: 0 % (ref 0.0–0.2)

## 2020-10-07 LAB — GLUCOSE, CAPILLARY
Glucose-Capillary: 102 mg/dL — ABNORMAL HIGH (ref 70–99)
Glucose-Capillary: 108 mg/dL — ABNORMAL HIGH (ref 70–99)
Glucose-Capillary: 111 mg/dL — ABNORMAL HIGH (ref 70–99)
Glucose-Capillary: 114 mg/dL — ABNORMAL HIGH (ref 70–99)
Glucose-Capillary: 120 mg/dL — ABNORMAL HIGH (ref 70–99)
Glucose-Capillary: 129 mg/dL — ABNORMAL HIGH (ref 70–99)

## 2020-10-07 LAB — RENAL FUNCTION PANEL
Albumin: 2.3 g/dL — ABNORMAL LOW (ref 3.5–5.0)
Anion gap: 9 (ref 5–15)
BUN: 12 mg/dL (ref 8–23)
CO2: 26 mmol/L (ref 22–32)
Calcium: 8.3 mg/dL — ABNORMAL LOW (ref 8.9–10.3)
Chloride: 104 mmol/L (ref 98–111)
Creatinine, Ser: 0.54 mg/dL (ref 0.44–1.00)
GFR, Estimated: >60 mL/min (ref >60)
Glucose, Bld: 111 mg/dL — ABNORMAL HIGH (ref 70–99)
Phosphorus: 3.3 mg/dL (ref 2.5–4.6)
Potassium: 3.7 mmol/L (ref 3.5–5.1)
Sodium: 139 mmol/L (ref 135–145)

## 2020-10-07 LAB — BASIC METABOLIC PANEL
Anion gap: 8 (ref 5–15)
BUN: 12 mg/dL (ref 8–23)
CO2: 27 mmol/L (ref 22–32)
Calcium: 8.4 mg/dL — ABNORMAL LOW (ref 8.9–10.3)
Chloride: 105 mmol/L (ref 98–111)
Creatinine, Ser: 0.5 mg/dL (ref 0.44–1.00)
GFR, Estimated: >60 mL/min (ref >60)
Glucose, Bld: 111 mg/dL — ABNORMAL HIGH (ref 70–99)
Potassium: 3.7 mmol/L (ref 3.5–5.1)
Sodium: 140 mmol/L (ref 135–145)

## 2020-10-07 LAB — PROCALCITONIN: Procalcitonin: 23.89 ng/mL

## 2020-10-07 MED ORDER — METOPROLOL TARTRATE 25 MG PO TABS
25.0000 mg | ORAL_TABLET | Freq: Two times a day (BID) | ORAL | Status: DC
  Administered 2020-10-07 – 2020-10-09 (×3): 25 mg via ORAL
  Filled 2020-10-07 (×3): qty 1

## 2020-10-07 NOTE — TOC Progression Note (Signed)
Transition of Care [facility]) - Progression Note    Patient Details  Name: Kathryn Garrett MRN: [id number] Date of Birth: 01-19-1946  Transition of Care [facility]) CM/SW [place], [place] Phone Number: [phone] 10/07/2020, 4:15 PM  Clinical Narrative:     CSW spoke with patient and daughters [name] (Daughter) [phone] and Lisabeth Devoid [phone] about discharge plan.  CSW spoke about the four facilities which have offered placement.  CSW explained SNF placement process and insurance authorization estimated timeline. Ms. Carmie End stated she was unhappy with the SNF placement choices given to the patient.  Ms. Carmie End questioned whether this CSW has reached out to the three facilities which [place] stated accepted the insurance.  CSW stated I had not received a reply from any of those facilities.  Ms. Carmie End stated this CSW was pushing the facilities that had made a bed offers.  CSW stated I was not pushing those facilities, but I was updating my patient on the current status of SNF placement. CSW asked the patient if she understood the process and she stated she did.  CSW stated to Ms. Rathbone that I would reach out to the facilities she gave me the information about and I would update the patient on their response.  [facility] CSW spoke with [place] and she stated they do not take [employer] [phone].  [place] left voicemail for admission coordinator requesting update [phone].  [facility], CSW left voicemail for update [phone].   Barriers to Discharge: Continued Medical Work up  Expected Discharge Plan and Services   In-house Referral: Clinical Social Work   Post Acute Care Choice:  [facility]) Living arrangements for the past 2 months: Single Family Home                                       Social Determinants of Health (SDOH) Interventions    Readmission Risk Interventions No flowsheet  data found.

## 2020-10-07 NOTE — Progress Notes (Signed)
Progress Note  Patient Name: Kathryn Garrett Date of Encounter: 10/07/2020  [facility] Cardiologist: Larae Grooms, MD   Subjective   Sitting up in recliner this morning, off oxygen saturations 96% " I do not want a feeding tube" Reports no difficulty swallowing just does not like some of the food, " I do not eat pancakes" which was brought this morning Feels that she can eat plenty of other foods to get her weight up Reports some cough, not much sputum  Inpatient Medications    Scheduled Meds: . aspirin  81 mg Oral QHS  . buprenorphine-naloxone  2 tablet Sublingual Daily  . Chlorhexidine Gluconate Cloth  6 each Topical QHS  . clopidogrel  75 mg Oral QHS  . fluticasone  1 spray Each Nare Daily  . insulin aspart  0-15 Units Subcutaneous Q4H  . levothyroxine  88 mcg Oral Q0600  . loratadine  10 mg Oral Daily  . mouth rinse  15 mL Mouth Rinse BID  . metoprolol tartrate  25 mg Oral BID  . midodrine  10 mg Oral TID WC  . mometasone-formoterol  2 puff Inhalation BID  . multivitamin with minerals  1 tablet Oral Daily  . pantoprazole  40 mg Oral Daily  . rosuvastatin  40 mg Oral QHS  . sodium chloride flush  3 mL Intravenous Q12H  . sodium chloride flush  3 mL Intravenous Q12H   Continuous Infusions: . sodium chloride Stopped (09/24/20 1321)  . sodium chloride Stopped (10/06/20 1418)  . meropenem (MERREM) IV 1 g (10/07/20 1303)  . norepinephrine (LEVOPHED) Adult infusion 1 mcg/min (10/07/20 1223)   PRN Meds: acetaminophen **OR** acetaminophen, albuterol, artificial tears, docusate, guaiFENesin, ondansetron (ZOFRAN) IV, polyethylene glycol   Vital Signs    Vitals:   10/07/20 1000 10/07/20 1100 10/07/20 1200 10/07/20 1300  BP: (!) 117/100 (!) 148/59 122/61 (!) 111/54  Pulse: (!) 103 96 92 (!) 111  Resp: 19 14 12 16   Temp:      TempSrc:      SpO2: 95% 96% 93% 97%  Weight:      Height:        Intake/Output Summary (Last 24 hours) at 10/07/2020 1317 Last  data filed at 10/07/2020 1223 Gross per 24 hour  Intake 1155.1 ml  Output 1050 ml  Net 105.1 ml   Last 3 Weights 10/07/2020 10/06/2020 10/05/2020  Weight (lbs) 128 lb 1.4 oz 120 lb 5.9 oz 120 lb 5.9 oz  Weight (kg) 58.1 kg 54.6 kg 54.6 kg      Telemetry    Sinus tachycardia rate 100, with movement out of bed rate up to 1 30-1 40 - Personally Reviewed  ECG     - Personally Reviewed  Physical Exam   GEN: No acute distress.   Neck: No JVD Cardiac: RRR, no murmurs, rubs, or gallops.  Respiratory:  Coarse breath sounds GI: Soft, nontender, non-distended  MS: No edema; No deformity. Neuro:  Nonfocal  Psych: Normal affect   Labs    High Sensitivity Troponin:   Recent Labs  Lab 09/10/20 2050 09/11/20 0437 09/11/20 1433 09/11/20 1611 09/11/20 2011  TROPONINIHS 1,838* 2,434* 2,265* 2,455* 2,317*      Chemistry Recent Labs  Lab 10/02/20 1736 10/03/20 8657 10/04/20 0407 10/05/20 0325 10/06/20 0413 10/07/20 0438  NA 134* 138   < > 134* 139 139  140  K 3.9 3.5   < > 4.1 4.3 3.7  3.7  CL 99 105   < >  101 101 104  105  CO2 26 25   < > 26 29 26  27   GLUCOSE 142* 136*   < > 101* 117* 111*  111*  BUN 12 10   < > 10 12 12  12   CREATININE 0.58 0.44   < > 0.49 0.66 0.54  0.50  CALCIUM 8.5* 8.4*   < > 8.2* 8.4* 8.3*  8.4*  PROT 6.4*  --   --   --   --   --   ALBUMIN 2.8* 2.5*  --   --   --  2.3*  AST 27  --   --   --   --   --   ALT 28  --   --   --   --   --   ALKPHOS 77  --   --   --   --   --   BILITOT 0.6  --   --   --   --   --   GFRNONAA >60 >60   < > >60 >60 >60  >60  ANIONGAP 9 8   < > 7 9 9  8    < > = values in this interval not displayed.     Hematology Recent Labs  Lab 10/05/20 0325 10/06/20 0413 10/07/20 0438  WBC 5.6 11.2* 8.6  RBC 3.28* 3.51* 3.19*  HGB 10.0* 10.6* 9.6*  HCT 30.7* 32.7* 29.8*  MCV 93.6 93.2 93.4  MCH 30.5 30.2 30.1  MCHC 32.6 32.4 32.2  RDW 14.6 14.6 14.5  PLT 211 255 258    BNPNo results for input(s): BNP, PROBNP  in the last 168 hours.   DDimer  Recent Labs  Lab 10/02/20 1736  DDIMER 3.61*     Radiology    No results found.  Cardiac Studies     Patient Profile     75 y.o. female with CAD status post RCA PCI in 2017 and in-stent restenosis requiring PCI, VF arrest,, chronic systolic diastolic heart failure, COPD, ongoing tobacco abuse, hypertension, hyperlipidemia, COPD, and narcotic abuse admitted with COPD exacerbation and Klebsiella pneumonia as well as acute on chronic systolic and diastolic heart failure. Cardiology was initially consulted for elevated troponin. Echo revealed new systolic dysfunction. Hospital course was complicated by cardiogenic shock requiring milrinone for diuresis and hypoxic respiratory failure requiring intubation. She underwent left and right heart cath on 5/13 and was found to have mild to moderate nonobstructive CAD and normal filling pressures. She also had SVT that was managed with metoprolol. Cardiology signed off and now is very engaging due to recurrent tachycardia.  Assessment & Plan    SVT/Sinus tachycardia -Notes indicating heart rates up to 150 on May 21, sinus tachycardia -Amiodarone initially started was discontinued secondary to low blood pressure -Scheduled to receive metoprolol succinate 50 daily for sinus tachycardia but this has been held for low blood pressure this morning -Currently on low-dose pressors which is being weaned -Sinus tachycardia likely triggered by underlying sepsis/bacteremia, COPD -Recommend we decrease metoprolol succinate 50 down to metoprolol tartrate 25 twice daily with hold parameters  Bacteremia/sepsis with hypotension on pressors Enterobacter and Klebsiella pneumonia on blood cultures,  mentating better On broad-spectrum antibiotics  CAD s/p PCI Elevated troponin - cardiac cath showed nonobstructive disease Demand ischemia in the setting of above - continue ASA, Plavix, statin Metoprolol as blood pressure  tolerates  Acute systolic and diastolic heart failure - LVEF reduced to 25-30% Catheterization has been completed, no intervention needed -Metoprolol  as blood pressure tolerates above Other cardiomyopathy medications on hold for low blood pressure including Entresto  -Once recovered from sepsis could consider adding Farxiga/Jardiance  Weight loss She will work to increase her calorie intake, denies any trouble swallowing   Total encounter time more than 35 minutes  Greater than 50% was spent in counseling and coordination of care with the patient   For questions or updates, please contact Emma Please consult [email or web address] for contact info under        Signed, Ida Rogue, MD  10/07/2020, 1:17 PM

## 2020-10-07 NOTE — Progress Notes (Signed)
48 hour Calorie Count   Estimated Nutritional Needs:   Kcal:  1400-1600kcal/day Protein:  70-80g/day Fluid:  1.4L/day   Breakfast 5/24: pt consumed 0% of breakfast   Total- 0  Lunch 5/24: 50% of pot roast, mashed potatoes, broccoli and cake   Total- 353kcal and 13g protein   Dinner 5/24: 50% roasted [place], mashed potatoes, cranberry juice   Total- 210kcal and 9g protein   Breakfast 5/25: 75% cream of wheat, eggs, sausage  Total- 289kcal and 19g protein   Lunch 5/25: 25% spaghetti  Total- 147kcal and 4g protein  Dinner 5/25: [place], mashed potatoes, beans, cranberry juice   Total- 268kcal and 10g protein  Total Intake Day 1:  563kcal (40% estimated needs) and  22g protein (31% estimated needs) Total Intake Day 2:  704kcal (50% estimated needs) and  33g protein (47% estimated needs)  [name] MS, RD, LDN Please refer to [facility] for RD and/or RD on-call/weekend/after hours pager

## 2020-10-07 NOTE — NC FL2 (Signed)
[place] LEVEL OF CARE SCREENING TOOL     IDENTIFICATION  Patient Name: Kathryn Garrett Birthdate: January 30, 1946 Sex: female Admission Date (Current Location): 09/10/2020  [place] and [place] Number:  Selena Lesser [id number] T Facility and Address:  [facility], [address], [place], [place] [address]      Provider Number: [id number]  Attending Physician Name and Address:  Kathie Dike, MD  Relative Name and Phone Number:  [name] (Daughter)   [phone] [facility]), [place] Daughter     [phone]    Current Level of Care: Hospital Recommended Level of Care: [place] Prior Approval Number:    Date Approved/Denied:   PASRR Number: [id number]  Discharge Plan: SNF    Current Diagnoses: Patient Active Problem List   Diagnosis Date Noted  . SVT (supraventricular tachycardia) ([place])   . Acute combined systolic and diastolic heart failure ([place])   . Pure hypercholesterolemia   . Acute on chronic HFrEF (heart failure with reduced ejection fraction) ([place])   . Acute respiratory failure ([place]) 09/11/2020  . HFrEF (heart failure with reduced ejection fraction) ([place])   . Acute exacerbation of chronic obstructive pulmonary disease (COPD) ([place]) 09/10/2020  . Severe sepsis ([place]) 09/10/2020  . NSTEMI (non-ST elevated myocardial infarction) ([place]) 09/10/2020  . Hyperglycemia 09/10/2020  . SOB (shortness of breath) 09/10/2020  . Chronic obstructive pulmonary disease ([place]) 03/05/2018  . History of opioid abuse ([place]) 03/05/2018  . Nocturnal hypoxemia 10/30/2017  . Peripheral arterial disease ([place]) 08/30/2016  . Chest pain 06/04/2016  . S/P PTCA (percutaneous transluminal coronary angioplasty) 02/10/16 to RCA lesion for in stent restenois 02/11/2016  . Unstable angina ([place])   . Hyperlipidemia LDL goal <70 01/28/2016  . Presence of coronary angioplasty implant and graft 10/15/2015  . Tobacco abuse 10/15/2015  . CAP  (community acquired pneumonia) 09/24/2015  . CAD in native artery 09/24/2015  . HTN (hypertension) 09/24/2015  . Lung nodule, solitary 03/31/2014  . Liver nodule 03/31/2014    Orientation RESPIRATION BLADDER Height & Weight     Self,Time,Situation,Place  Normal Incontinent Weight: 128 lb 1.4 oz (58.1 kg) Height:  4\' 11"  (149.9 cm)  BEHAVIORAL SYMPTOMS/MOOD NEUROLOGICAL BOWEL NUTRITION STATUS      Continent Diet  AMBULATORY STATUS COMMUNICATION OF NEEDS Skin   Limited Assist Verbally Normal                       Personal Care Assistance Level of Assistance  Bathing,Feeding,Dressing,Total care Bathing Assistance: Limited assistance Feeding assistance: Independent Dressing Assistance: Limited assistance Total Care Assistance: Limited assistance   Functional Limitations Info  Sight,Hearing,Speech Sight Info: Adequate Hearing Info: Adequate Speech Info: Adequate    SPECIAL CARE FACTORS FREQUENCY  PT (By licensed PT),OT (By licensed OT)     PT Frequency: 5X per week OT Frequency: 5X per week            Contractures Contractures Info: Not present    Additional Factors Info  Code Status,Allergies,Isolation Precautions Code Status Info: full code Allergies Info: Azithromycin Low Rash     Isolation Precautions Info: Contact precautions  Added: 10/05/20 by Brendia Sacks, RN     Current Medications (10/07/2020):  This is the current hospital active medication list Current Facility-Administered Medications  Medication Dose Route Frequency Provider Last Rate Last Admin  . 0.9 %  sodium chloride infusion  250 mL Intravenous Continuous End, Harrell Gave, MD   Stopped at 09/24/20 1321  . 0.9 %  sodium chloride infusion  250 mL Intravenous Continuous Awilda Bill, NP   Stopped at 10/06/20 1418  . acetaminophen (TYLENOL) tablet 650 mg  650 mg Oral Q6H PRN End, Harrell Gave, MD   650 mg at 10/05/20 1015   Or  . acetaminophen (TYLENOL) suppository 650 mg  650 mg Rectal  Q6H PRN End, Harrell Gave, MD      . albuterol (PROVENTIL) (2.5 MG/3ML) 0.083% nebulizer solution 2.5 mg  2.5 mg Nebulization Q4H PRN End, Harrell Gave, MD   2.5 mg at 10/02/20 0717  . artificial tears (LACRILUBE) ophthalmic ointment   Both Eyes Q4H PRN End, Harrell Gave, MD   Given at 09/17/20 [phone]  . aspirin chewable tablet 81 mg  81 mg Oral QHS End, Christopher, MD   81 mg at 10/06/20 2115  . buprenorphine-naloxone (SUBOXONE) 2-0.5 mg per SL tablet 2 tablet  2 tablet Sublingual Daily End, Christopher, MD   2 tablet at 10/07/20 0935  . Chlorhexidine Gluconate Cloth 2 % PADS 6 each  6 each Topical QHS End, Harrell Gave, MD   6 each at 10/06/20 2115  . clopidogrel (PLAVIX) tablet 75 mg  75 mg Oral QHS End, Christopher, MD   75 mg at 10/06/20 2114  . docusate (COLACE) 50 MG/5ML liquid 100 mg  100 mg Oral BID PRN End, Harrell Gave, MD      . fluticasone (FLONASE) 50 MCG/ACT nasal spray 1 spray  1 spray Each Nare Daily End, Christopher, MD   1 spray at 10/07/20 0937  . guaiFENesin (ROBITUSSIN) 100 MG/5ML solution 200 mg  10 mL Oral Q4H PRN End, Harrell Gave, MD   200 mg at 09/26/20 2208  . insulin aspart (novoLOG) injection 0-15 Units  0-15 Units Subcutaneous Q4H End, Christopher, MD   2 Units at 10/07/20 1204  . levothyroxine (SYNTHROID) tablet 88 mcg  88 mcg Oral Q0600 End, Harrell Gave, MD   88 mcg at 10/07/20 0616  . loratadine (CLARITIN) tablet 10 mg  10 mg Oral Daily End, Christopher, MD   10 mg at 10/07/20 0936  . MEDLINE mouth rinse  15 mL Mouth Rinse BID End, Harrell Gave, MD   15 mL at 10/06/20 2115  . meropenem (MERREM) 1 g in sodium chloride 0.9 % 100 mL IVPB  1 g Intravenous Q8H Berton Mount, RPH   Stopped at 10/07/20 1333  . metoprolol tartrate (LOPRESSOR) tablet 25 mg  25 mg Oral BID Minna Merritts, MD      . midodrine (PROAMATINE) tablet 10 mg  10 mg Oral TID WC Darel Hong D, NP   10 mg at 10/07/20 1204  . mometasone-formoterol (DULERA) 200-5 MCG/ACT inhaler 2 puff  2 puff  Inhalation BID Ralene Muskrat B, MD   2 puff at 10/07/20 [phone]  . multivitamin with minerals tablet 1 tablet  1 tablet Oral Daily End, Christopher, MD   1 tablet at 10/07/20 0935  . norepinephrine (LEVOPHED) 4mg  in 240mL premix infusion  2-10 mcg/min Intravenous Titrated Awilda Bill, NP   Stopped at 10/07/20 1259  . ondansetron (ZOFRAN) injection 4 mg  4 mg Intravenous Q6H PRN Sreenath, Sudheer B, MD      . pantoprazole (PROTONIX) EC tablet 40 mg  40 mg Oral Daily End, Christopher, MD   40 mg at 10/07/20 0936  . polyethylene glycol (MIRALAX / GLYCOLAX) packet 17 g  17 g Oral Daily PRN End, Christopher, MD      . rosuvastatin (CRESTOR) tablet 40 mg  40 mg Oral QHS Visser, Jacquelyn D, PA-C   40 mg  at 10/06/20 2114  . sodium chloride flush (NS) 0.9 % injection 3 mL  3 mL Intravenous Q12H End, Christopher, MD   3 mL at 10/06/20 2116  . sodium chloride flush (NS) 0.9 % injection 3 mL  3 mL Intravenous Q12H End, Christopher, MD   3 mL at 10/06/20 2116     Discharge Medications: Please see discharge summary for a list of discharge medications.  Relevant Imaging Results:  Relevant Lab Results:   Additional Information SS# [id number]  Adelene Amas, LCSWA

## 2020-10-07 NOTE — Progress Notes (Signed)
Nutrition Follow-up  DOCUMENTATION CODES:   Obesity unspecified  INTERVENTION:   Continue Magic cup TID with meals, each supplement provides 290 kcal and 9 grams of protein  Snacks (1000, 1400 and 2000)  MVI po daily   Assist with ordering meals   NUTRITION DIAGNOSIS:   Inadequate oral intake related to inability to eat (pt sedated and ventilated) as evidenced by NPO status.  GOAL:   Patient will meet greater than or equal to 90% of their needs -not met   MONITOR:   PO intake,Supplement acceptance,Labs,Weight trends,Skin,I & O's  ASSESSMENT:   75 y/o female with h/o COPD, HTN, NSTEMI, CHF and lung mass who is admitted with PNA and possible acute a stroke involving left MCA territory. Pt also found to have ESBL Klebsiella bacteremia.   Met with pt in room today. Pt reports that she is feeling good today. Pt reports that her appetite is improving. Pt reports that she did not eat breakfast this morning as she does not like pancakes. Pt reports that she is eating some of the Magic Cups, about 50%. Pt reports that she is not drinking the [employer] as she does not like milk. RD completed a 48 hour calorie count on patient; pt meeting about 50% of her estimated needs via oral intake. Pt has been recommended for PEG tube but refuses. RD discussed with pt the importance of adequate nutrition needed to preserve lean muscle. Pt agrees to have snacks between her meals; pt reports that she loves yogurt, peanut butter and banana sandwiches. Pt is requesting to have chips today to snack on. Pt has not been using her menu to order foods that she enjoys. RD showed pt today how to use her menu and call in meals; pt reports that she will get her daughters to order her meals for her. RD will change pt to needs assist.   Per chart, pt is down ~15lbs(11%) since admit; pt is aware of her weight loss.   Medications reviewed and include: aspirin, plavix, insulin, synthroid, MVI, protonix, meropenem,  levophed   Labs reviewed: K 3.7 wnl, P 3.3 wnl Hgb 9.6(L), Hct 29.8(L) cbgs- 120, 114 x 24 hrs  NUTRITION - FOCUSED PHYSICAL EXAM:  Flowsheet Row Most Recent Value  Orbital Region No depletion  Upper Arm Region Mild depletion  Thoracic and Lumbar Region No depletion  Buccal Region No depletion  Temple Region Moderate depletion  Clavicle Bone Region Mild depletion  Clavicle and Acromion Bone Region Mild depletion  Scapular Bone Region No depletion  Dorsal Hand No depletion  Patellar Region Mild depletion  Anterior Thigh Region No depletion  Posterior Calf Region No depletion  Edema (RD Assessment) Mild  Hair Reviewed  Eyes Reviewed  Mouth Reviewed  Skin Reviewed  Nails Reviewed     Diet Order:   Diet Order            DIET DYS 3 Room service appropriate? Yes; Fluid consistency: Honey Thick  Diet effective now                EDUCATION NEEDS:   No education needs have been identified at this time  Skin:  Skin Assessment: Reviewed RN Assessment  Last BM:  5/25- type 4  Height:   Ht Readings from Last 1 Encounters:  09/24/20 $RemoveB'4\' 11"'[id number]$  (1.499 m)    Weight:   Wt Readings from Last 1 Encounters:  10/07/20 58.1 kg    Ideal Body Weight:  44.5 kg  BMI:  Body  mass index is 25.87 kg/m.  Estimated Nutritional Needs:   Kcal:  1400-1600kcal/day  Protein:  70-80g/day  Fluid:  1.4L/day  [name] MS, RD, LDN Please refer to [facility] for RD and/or RD on-call/weekend/after hours pager

## 2020-10-07 NOTE — Progress Notes (Signed)
Physical Therapy Treatment Patient Details Name: Kathryn Garrett MRN: [id number] DOB: 04-Aug-1945 Today's Date: 10/07/2020    History of Present Illness 75 y.o. female with history of CAD with VF arrest s/p PCI to the RCA in 2017 s/p PTCA to the RCA for ISR in 2018, chronic combined systolic and diastolic CHF, COPD with ongoing tobacco use, HTN, HLD, anemia, and narcotic abuse on Suboxone therapy admitted with acute hypoxic respiratory failure requiring mechanical ventilation (extubated 09/17/2020) secondary to COPD exacerbation Klebsiella PNA and acute on chronic combined CHF with transient ST elevation and echo demonstrating new LV dysfunction. Patient with recent transfer to ICU for blood pressure issues requiring pressor support     Pt was long sitting in bed upon arriving. She agrees to PT session however does endorse not feeling very well. Resting HR 115 bpm that quickly elevated to 130s with sitting up EOB. Sat EOB x ~ 10 minutes prior to stand and taking steps to recliner. HR elevated to 150 with standing activity. Only in standing for total of ~ 15 sec. No LOB but severely limited by fatigue. Acute PT will continue to progress pt as able per POC.   Follow Up Recommendations  CIR     Equipment Recommendations  None recommended by PT       Precautions / Restrictions Precautions Precautions: Fall Restrictions Weight Bearing Restrictions: No    Mobility  Bed Mobility Overal bed mobility: Needs Assistance Bed Mobility: Supine to Sit     Supine to sit: Supervision Sit to supine: Supervision        Transfers Overall transfer level: Needs assistance Equipment used: Rolling walker (2 wheeled) Transfers: Sit to/from Stand Sit to Stand: Min assist         General transfer comment: Pt was able to stand and take steps 3 ft with RW. limited by fatigue and response to activity. HR elevated form 115 to 150bpm with only standing < 20 sec.  Ambulation/Gait Ambulation/Gait  assistance: Min assist Gait Distance (Feet): 5 Feet Assistive device: Rolling walker (2 wheeled)               Balance Overall balance assessment: Needs assistance Sitting-balance support: Feet supported;Single extremity supported Sitting balance-Leahy Scale: Fair     Standing balance support: Bilateral upper extremity supported;During functional activity Standing balance-Leahy Scale: Fair      Cognition Arousal/Alertness: Awake/alert Behavior During Therapy: WFL for tasks assessed/performed Overall Cognitive Status: Within Functional Limits for tasks assessed        Following Commands: Follows one step commands consistently;Follows multi-step commands with increased time Safety/Judgement: Decreased awareness of safety;Decreased awareness of deficits   Problem Solving: Slow processing;Decreased initiation;Requires tactile cues;Requires verbal cues General Comments: Pt is A and O x 4             Pertinent Vitals/Pain Pain Assessment: No/denies pain Pain Score: 0-No pain           PT Goals (current goals can now be found in the care plan section) Acute Rehab PT Goals Patient Stated Goal: get better so I can get home Progress towards PT goals: Progressing toward goals    Frequency    Min 2X/week      PT Plan Current plan remains appropriate    Co-evaluation     PT goals addressed during session: Mobility/safety with mobility;Balance;Strengthening/ROM;Proper use of DME        AM-PAC PT "6 Clicks" Mobility   Outcome Measure  Help needed turning from your back to your  side while in a flat bed without using bedrails?: A Little Help needed moving from lying on your back to sitting on the side of a flat bed without using bedrails?: A Little Help needed moving to and from a bed to a chair (including a wheelchair)?: A Lot Help needed standing up from a chair using your arms (e.g., wheelchair or bedside chair)?: A Lot Help needed to walk in hospital  room?: A Lot Help needed climbing 3-5 steps with a railing? : Total 6 Click Score: 13    End of Session Equipment Utilized During Treatment: Gait belt Activity Tolerance: Patient limited by fatigue Patient left: in chair;with chair alarm set;with call bell/phone within reach Nurse Communication: Mobility status PT Visit Diagnosis: Difficulty in walking, not elsewhere classified (R26.2);Muscle weakness (generalized) (M62.81);Unsteadiness on feet (R26.81)     Time: 2641-5830 PT Time Calculation (min) (ACUTE ONLY): 24 min  Charges:  $Therapeutic Activity: 23-37 mins                     Julaine Fusi PTA 10/07/20, 1:36 PM

## 2020-10-07 NOTE — Progress Notes (Signed)
  Oct 07, 2020  Patient: Kathryn Garrett  Date of Birth: 01-04-1946  Date of Visit: 09/10/2020    To Whom It May Concern:  [name] was seen and treated in our emergency department/Intensive Care Unit on 09/10/2020. Dillard Cannon    Sincerely,   Lu Duffel, RN

## 2020-10-07 NOTE — Progress Notes (Signed)
NAME:  Kathryn Garrett, MRN:  [id number], DOB:  [birth year], LOS: 11 ADMISSION DATE:  09/10/2020, INITIAL CONSULTATION DATE:  09/11/2020 REFERRING MD: Dr. Damita Dunnings, CHIEF COMPLAINT: SOB  Brief Patient Description  [name] is a 75F with CAD status post RCA PCI in 2017 and in-stent restenosis requiring PCI, VF arrest,, chronic systolic diastolic heart failure, COPD, ongoing tobacco abuse, hypertension, hyperlipidemia, COPD, and narcotic abuse admitted with COPD exacerbation and Klebsiella pneumonia as well as acute on chronic systolic and diastolic heart failure.  Hospital course was complicated by cardiogenic shock requiring milrinone for diuresis and hypoxic respiratory failure requiring intubation. She was treated with Cefepime/Ancef for aspiration. She underwent left and right heart cath on 5/13 and was found to have mild to moderate nonobstructive CAD and normal filling pressures. She also had SVT that was managed with metoprolol. Patient was extubated and transferred to the floor. On 5/21 she was noted to be hypotensive despite IVFs and in sinus tachycardia to the 150s.  Lacate was 2.4.  Blood cultures with GNRs.  WBC was 14.8.  No significant infection in urine. CXR was unremarkable.  She was treated with Cefepime for Klebsiella pneumonia and transferred to ICU.  SHe was started on levophed for septic shock and PCCM re-consulted to assist with management.  Pertinent  Medical History  CAD status post RCA PCI in 2017 and in-stent restenosis requiring PCI,  VF arrest,,  chronic systolic diastolic heart failure,  COPD,  ongoing tobacco abuse,  hypertension,  hyperlipidemia,  Chronic narcotic abuse    Significant Hospital Events: Including procedures, antibiotic start and stop dates in addition to other pertinent events   09/10/2020-patient admits admitted with hospitalist service for COPD exacerbation and left lower lobe pneumonia 09/11/2020-overnight patient had suspected flash pulmonary  edema, dyspnea followed by somnolence requiring emergent intubation and mechanical ventilation.  Admitted to ICU 09/12/20- patient failed SBT , she had blood tinged secretions fromETT and aggitation, family was at bedside and we agreed on giving her more time and try again.  09/14/20- Failed SBT (became hypoxic with sats mid 80's, increased WOB and assessory muscle use, HR 140-160's). Will diurese today and add Metoprolol.  Tracheal aspirate from 4/30 with KLEBSIELLA PNEUMONIAE (resistant to Ampicillin)   09/15/20- Failed SBT (increased WOB and assessory muscle use, RR 40's, HR 140-150's, Hypertensive); plan to add scheduled PO Klonopin and Oxycodone, Lasix 40 mg IV x1, consult Palliative Care 09/16/20- Worsening Leukocytosis up to 21.7, low grade fever overnight, increased secretions overnight, will repeat Tracheal aspirate, remove central line, add mucinex, plan for SBT 09/17/20-[name] passed SBT in AM and following commands still having some moderate secretions 09/18/20-  Extubated yesterday.  Failed swallow eval today.  09/20/20- Weaned to room air, Leukocytosis improving; Hemodynamically stable 09/27/20- patient is coughing up phlegm but clinically is improved, we discussed pneumonia and COPD with outpatient clinic follow up with pulmonology.  There is plan for post dc rehab 09/28/20- patient is improved and she is being optimized for CIR, pulmonary will sign off and can follow up on outpatient basis 09/29/20- family at bedside today.  We discussed barium swallow with aspiration.  09/30/20- patient had PT today she walked today without need of additional supplemental O2 therapy.  She generally has been walking ok but has had severe LE weakness here.  She ate lunch infront of me had cough after couple bites.  We disucssed aspiartion risk and her barium swallow.  10/01/20: PCCM signed off  10/02/20: Pt with hypotension despite aggressive fluid resuscitation  concerning for sepsis secondary to UTI requiring levophed  gtt and transfer to stepdown unit.  PCCM signed back on case to assist with management  10/03/20: Remains on Levophed 10/04/20: Weaning down Levophed, increase Midodrine to 10 mg TID; consult ID for Bacteremia  10/05/20: Levophed weaned off, MRI spine yesterday negative for osteomyelitis discitis or septic arthritis, no epidural abscess or other fluid collection. Klebsiella is +ESBL.  ABX changed to Meropenem. 10/06/20: Deteriorated yesterday (fever, tachycardic, yesterday), Klebsiella ESBL +, ABX changed to Meropenem, requiring low dose Levophed again (currently on 5 mcg), ID following 10/06/20: Slowly weaning Levophed (currently at 3 mcg), WBC & Procalcitonin improving  Cultures:  4/29: SARS-CoV-2 PCR>> negative 4/29:Influenza PCR>> negative 5/1: Strep pneumo urinary antigen>>negative 5/1: Legionella urinary antigen>>negative 5/5: Tracheal aspirate> candida albicans 5/21: Blood culture x2>>3 out 4 bottles growing GNR BCID positive for klebsiella pneumoniae (+ESBL) 5/21: UCx> negative  Antimicrobials:  Ceftriaxone 5/22>>5/24 Meropenem 5/24>> Cefepime 5/21 x 1 dose Metronidazole 5/21 x 1 dose  Interim History / Subjective:  -No acute events overnight -Afebrile, on 2L [place],  hemodynamically stable, weaning Levophed (currently on 3 mcg) -WBC improved to 8.6, PCT improved to 23.8 -Pt denies all complaints   OBJECTIVE   Blood pressure (!) 133/52, pulse 87, temperature 98.4 F (36.9 C), temperature source Axillary, resp. rate 14, height 4\' 11"  (1.499 m), weight 58.1 kg, SpO2 98 %.        Intake/Output Summary (Last 24 hours) at 10/07/2020 0841 Last data filed at 10/07/2020 0816 Gross per 24 hour  Intake 1243.89 ml  Output 550 ml  Net 693.89 ml   Filed Weights   10/05/20 0500 10/06/20 0500 10/07/20 0500  Weight: 54.6 kg 54.6 kg 58.1 kg   Physical Examination: GENERAL:Acutely ill appearing 75 year-old patient, sitting in bed, no acute distress.  EYES: Pupils PERRLA. No scleral  icterus. Extraocular muscles intact.  HEENT: Head atraumatic, normocephalic. Oropharynx and nasopharynx clear.  NECK:  Supple, no jugular venous distention. No thyroid enlargement, no tenderness.  LUNGS: Clear breath sounds bilaterally, no wheezing, rales,rhonchi or crepitation. No use of accessory muscles of respiration.  CARDIOVASCULAR: Tachycardia, regular rhythm, S1, S2. No murmurs, rubs, or gallops. 2+ distal pulses ABDOMEN: Soft, nontender, nondistended. Bowel sounds present. No organomegaly or mass.  EXTREMITIES: No pedal edema, cyanosis, or clubbing.  NEUROLOGIC: Cranial nerves II through XII are intact. Muscle strength 5/5 in all extremities. Sensation intact. Gait not checked.  PSYCHIATRIC: The patient is alert and oriented x 4.  SKIN: Warm and dry. No obvious rash, lesion, or ulcer.   Labs/imaging that I havepersonally reviewed  (right click and "Reselect all SmartList Selections" daily)   Labs 09/13/2020:  WBC 8.6, procalcitonin 23.89, Hgb 9.6, hct 29.8, glucose 111, BUN 12, Cr. 0.5 CXR 10/05/20: Unchanged faint bibasilar hazy opacities, likely representing resolving pneumonia. No new or worsening opacities. MRI Spine (cervical, thoracic, lumbar) 10/03/20>>1. No MRI evidence for osteomyelitis discitis or septic arthritis within the cervical, thoracic, or lumbar spine. No epidural abscess or other collection. 2. Mild edema and enhancement within the lower posterior paraspinous musculature, nonspecific, but could reflect changes of mild muscular injury and/or strain. Possible acute myositis could also have this appearance. No discrete collections. 3. Mild multilevel cervical spondylosis with resultant mild spinal stenosis at C3-4 through C5-6. 4. Degenerative spondylosis at T6-7 and T7-8 with associated cord flattening, but no significant spinal stenosis. 5. Mild for age multilevel lumbar spondylosis, most pronounced at L4-5 where there is resultant mild-to-moderate bilateral  lateral recess stenosis. No  frank impingement. 6. Small layering bilateral pleural effusions, left greater than right.  Labs   CBC: Recent Labs  Lab 10/03/20 0822 10/04/20 0407 10/05/20 0325 10/06/20 0413 10/07/20 0438  WBC 8.6 5.5 5.6 11.2* 8.6  NEUTROABS 6.4 3.4 4.0 7.4 4.9  HGB 11.6* 10.4* 10.0* 10.6* 9.6*  HCT 35.8* 32.5* 30.7* 32.7* 29.8*  MCV 93.5 94.5 93.6 93.2 93.4  PLT 305 270 211 255 876    Basic Metabolic Panel: Recent Labs  Lab 10/03/20 0822 10/04/20 0407 10/05/20 0325 10/06/20 0413 10/07/20 0438  NA 138 141 134* 139 139  140  K 3.5 3.5 4.1 4.3 3.7  3.7  CL 105 108 101 101 104  105  CO2 25 26 26 29 26  27   GLUCOSE 136* 123* 101* 117* 111*  111*  BUN 10 8 10 12 12  12   CREATININE 0.44 0.44 0.49 0.66 0.54  0.50  CALCIUM 8.4* 8.4* 8.2* 8.4* 8.3*  8.4*  MG 1.7 1.8 2.1 2.3  --   PHOS 3.3 2.9 2.5 3.5 3.3   GFR: Estimated Creatinine Clearance: 47.2 mL/min (by C-G formula based on SCr of 0.5 mg/dL). Recent Labs  Lab 10/02/20 1736 10/02/20 1823 10/02/20 1824 10/02/20 2130 10/03/20 8115 10/04/20 0407 10/05/20 0325 10/06/20 0413 10/07/20 0438  PROCALCITON 1.93  --   --   --   --   --   --  39.42 23.89  WBC  --    < >  --   --    < > 5.5 5.6 11.2* 8.6  LATICACIDVEN  --   --  2.4* 1.1  --   --   --   --   --    < > = values in this interval not displayed.    Liver Function Tests: Recent Labs  Lab 10/02/20 1736 10/03/20 0822 10/07/20 0438  AST 27  --   --   ALT 28  --   --   ALKPHOS 77  --   --   BILITOT 0.6  --   --   PROT 6.4*  --   --   ALBUMIN 2.8* 2.5* 2.3*   No results for input(s): LIPASE, AMYLASE in the last 168 hours. No results for input(s): AMMONIA in the last 168 hours.  ABG    Component Value Date/Time   PHART 7.48 (H) 10/05/2020 1000   PCO2ART 28 (L) 10/05/2020 1000   PO2ART 66 (L) 10/05/2020 1000   HCO3 20.9 10/05/2020 1000   TCO2 23 01/14/2009 0327   ACIDBASEDEF 1.6 10/05/2020 1000   O2SAT 94.2 10/05/2020 1000      Coagulation Profile: Recent Labs  Lab 10/02/20 1736  INR 1.4*    Cardiac Enzymes: No results for input(s): CKTOTAL, CKMB, CKMBINDEX, TROPONINI in the last 168 hours.  HbA1C: Hgb A1c MFr Bld  Date/Time Value Ref Range Status  09/11/2020 04:37 AM 6.4 (H) 4.8 - 5.6 % Final    Comment:    (NOTE) Pre diabetes:          5.7%-6.4%  Diabetes:              >6.4%  Glycemic control for   <7.0% adults with diabetes   10/02/2019 12:28 PM 6.4 (H) 4.8 - 5.6 % Final    Comment:             Prediabetes: 5.7 - 6.4          Diabetes: >6.4          Glycemic  control for adults with diabetes: <7.0     CBG: Recent Labs  Lab 10/06/20 1207 10/06/20 1544 10/06/20 1915 10/07/20 0004 10/07/20 0402  GLUCAP 138* 137* 139* 120* 114*    Allergies Allergies  Allergen Reactions  . Azithromycin Rash     Home Medications  Prior to Admission medications   Medication Sig Start Date End Date Taking? Authorizing Provider  acetaminophen (TYLENOL) 500 MG tablet Take 1,000 mg by mouth every 6 (six) hours as needed for headache.   Yes [provider]  albuterol (PROVENTIL HFA;VENTOLIN HFA) 108 (90 Base) MCG/ACT inhaler Inhale 2 puffs into the lungs every 4 (four) hours as needed for wheezing or shortness of breath.   Yes [provider]  amoxicillin (AMOXIL) 500 MG tablet Take 500 mg by mouth 3 (three) times daily. 08/03/20  Yes [provider]  aspirin EC 81 MG tablet Take 81 mg by mouth at bedtime.    Yes [provider]  azelastine (ASTELIN) 0.1 % nasal spray USE 2 SPRAYS BID UNTIL DIRECTED TO STOP. USE FOR RUNNY NOSE 08/14/17  Yes [provider]  Buprenorphine HCl-Naloxone HCl 8-2 MG FILM Place 0.5 Film under the tongue daily.   Yes [provider]  cetirizine (ZYRTEC) 10 MG tablet Take 1 tablet (10 mg total) by mouth daily. 03/11/18  Yes Alene Mires, Sahar M, PA-C  clopidogrel (PLAVIX) 75 MG tablet TAKE 1 TABLET(75 MG) BY MOUTH AT BEDTIME 09/16/20   Yes Jettie Booze, MD  dexamethasone (DECADRON) 6 MG tablet dexamethasone 6 mg tablet  TAKE 1 TABLET BY MOUTH EVERY DAY   Yes [provider]  doxycycline (VIBRAMYCIN) 100 MG capsule doxycycline hyclate 100 mg capsule  TAKE 1 CAPSULE BY MOUTH TWICE DAILY   Yes [provider]  fluticasone (FLONASE) 50 MCG/ACT nasal spray Place 1 spray into both nostrils daily.   Yes [provider]  furosemide (LASIX) 40 MG tablet Take 1 tablet (40 mg total) by mouth daily as needed (swelling). 01/16/20  Yes Jettie Booze, MD  ibuprofen (ADVIL,MOTRIN) 600 MG tablet Take 600 mg by mouth as needed for pain. 08/14/17  Yes [provider]  isosorbide mononitrate (IMDUR) 30 MG 24 hr tablet TAKE 1 TABLET(30 MG) BY MOUTH DAILY 09/16/20  Yes Jettie Booze, MD  levothyroxine (SYNTHROID) 88 MCG tablet Take 88 mcg by mouth every morning. 05/26/20  Yes [provider]  metoprolol tartrate (LOPRESSOR) 25 MG tablet TAKE 1 TABLET BY MOUTH TWICE DAILY 09/16/20  Yes Jettie Booze, MD  mupirocin ointment (BACTROBAN) 2 % mupirocin 2 % topical ointment  APPLY TOPICALLY TO LOWER LEG THREE TIMES DAILY FOR 10 DAYS   Yes [provider]  nystatin cream (MYCOSTATIN) Apply topically 2 (two) times daily. 08/11/20  Yes [provider]  potassium chloride SA (KLOR-CON) 20 MEQ tablet Take 1 tablet (20 mEq total) by mouth daily as needed (Take with furosemide (lasix)). 01/16/20  Yes Jettie Booze, MD  predniSONE (DELTASONE) 20 MG tablet prednisone 20 mg tablet   Yes [provider]  rosuvastatin (CRESTOR) 20 MG tablet TAKE 1 TABLET(20 MG) BY MOUTH DAILY 09/16/20  Yes Jettie Booze, MD  TRELEGY ELLIPTA 100-62.5-25 MCG/INH AEPB Take 1 puff by mouth daily. 08/13/20  Yes [provider]  triamcinolone cream (KENALOG) 0.1 % Apply topically 2 (two) times daily. to affected area 08/11/20  Yes [provider]  metoprolol succinate  (TOPROL-XL) 50 MG 24 hr tablet Take 1 tablet (50 mg total) by  mouth daily. Take with or immediately following a meal. 09/29/20   Domenic Polite, MD  nitroGLYCERIN (NITROSTAT) 0.4 MG SL tablet Place 1 tablet (0.4 mg total) under the tongue every 5 (five) minutes as needed for chest pain. 03/12/18   Jettie Booze, MD  pantoprazole (PROTONIX) 40 MG tablet Take 1 tablet (40 mg total) by mouth daily. 09/29/20   Domenic Polite, MD  sacubitril-valsartan (ENTRESTO) 24-26 MG Take 1 tablet by mouth 2 (two) times daily. 09/28/20   Domenic Polite, MD   Scheduled Meds: . aspirin  81 mg Oral QHS  . buprenorphine-naloxone  2 tablet Sublingual Daily  . Chlorhexidine Gluconate Cloth  6 each Topical QHS  . clopidogrel  75 mg Oral QHS  . fluticasone  1 spray Each Nare Daily  . insulin aspart  0-15 Units Subcutaneous Q4H  . levothyroxine  88 mcg Oral Q0600  . loratadine  10 mg Oral Daily  . mouth rinse  15 mL Mouth Rinse BID  . metoprolol succinate  50 mg Oral Daily  . midodrine  10 mg Oral TID WC  . mometasone-formoterol  2 puff Inhalation BID  . multivitamin with minerals  1 tablet Oral Daily  . pantoprazole  40 mg Oral Daily  . rosuvastatin  40 mg Oral QHS  . sodium chloride flush  3 mL Intravenous Q12H  . sodium chloride flush  3 mL Intravenous Q12H   Continuous Infusions: . sodium chloride Stopped (09/24/20 1321)  . sodium chloride Stopped (10/06/20 1418)  . meropenem (MERREM) IV Stopped (10/07/20 3532)  . norepinephrine (LEVOPHED) Adult infusion 3 mcg/min (10/07/20 0816)   PRN Meds:.acetaminophen **OR** acetaminophen, albuterol, artificial tears, docusate, guaiFENesin, ondansetron (ZOFRAN) IV, polyethylene glycol  Resolved Hospital Problem list     ASSESSMENT & PLAN   Septic shock  Acute systolic congestive heart failure Acute non-ST elevation MI s/p cardiac cath 05/13 recommendation medical management  New diagnosis of A. fib with RVR ~ currently Sinus tachycardia -Continuous  cardiac monitoring -Maintain MAP >65 -Vasopressors as needed to maintain MAP goal -Continue midodrine to 10 mg TID -Patient's ejection fraction on echocardiogram is 30% -Diuresis as renal function and BP permits >> currently on hold due to vasopressors -Cardiology following, input is appreciated -Hold entresto and immediate release metoprolol due to hypotension  -Continue aspirin, plavix, and statin -Off Amiodarone   Severe sepsis due to [place] (+ESBL), currently unclear etiology Previous Klebsiella Pneumonia ~ treated (Completed 7 days of Cefepime/Ancef) -Monitor fever curve -Trend WBC's & Procalcitonin -Follow cultures as above -Blood cultures on 10/02/20 with klebsiella pneumoniae & Enterobacteriaceae  -ABX changed to Meropenem 10/05/20 -Urinalysis and urine culture on 5/21 negative (? Could be impacted by previous exposure to antibiotics) -CXR on 5/24 without new/worsening opacities, shows unchanged faint bibasilar opacities consistent with resolving pneumonia -MRI spine 10/04/20 negative for osteomyelitis discitis or septic arthritis within the cervical, thoracic, or lumbar spine. No epidural abscess or other collection. -ID following, appreciate input ~ will follow recommendations ~ recommends 7 days of Meropenem  Acute hypoxic respiratory failure secondary to Acute COPD Exacerbation & left lung multifocal pneumonia -S/p Extubation 5/7 -Supplemental O2 as needed to maintain O2 sats 88 to 94% -Follow intermittent CXR & ABG as needed -Completed 7 day course of ABX for Klebsiella Pneumonia -Bronchodilators as needed -Continue Dulera -Aggressive pulmonary toilet -PT/OT  Acute metabolic/toxic encephalopathy ~ resolved  -Continue outpatient Suboxone due to narcotic abuse hx        Best practice (right click and "Reselect all  SmartList Selections" daily)   Diet: DYS 3 Pain/Anxiety/Delirium protocol: not indicated  VAP protocol: not indicated   DVT prophylaxis: SCD and subq lovenox  GI prophylaxis: PPI Glucose control:  SSI Yes Central venous access: n/a Arterial line:  N/A Foley: N/A Mobility: OOB  PT consulted: yes Code Status:  full code Disposition: Stepdown       Darel Hong, AGACNP-BC [facility] [place] epic messenger for cross cover needs If after hours, please call E-link

## 2020-10-07 NOTE — Progress Notes (Signed)
PROGRESS NOTE    Kathryn Garrett  XAJ:[id number] DOB: 1946-04-28 DOA: 09/10/2020 PCP: Imagene Riches, NP   Brief Narrative:  74/F presented to the [facility] ED from home with complaints of shortness of breath -Admitted on 4/29 with hypoxic respiratory failure secondary to COPD exacerbation and community-acquired pneumonia [facility] course was complicated by flash pulmonary edema and NSTEMI requiring mechanical ventilation, intubated on 4/30  Post extubation patient continued to slowly improve.  Initially was slated for CIR however unfortunately insurance denied.  Currently bed search initiated for skilled nursing facility.  Patient remains medically stable for discharge.  Received notification of case management that SNF had been denied by patient's insurance.  Appeal currently in progress.  5/22: Patient had acute onset of tachyarrhythmia yesterday.  Responded to bedside.  Ventricular rates approximately 60.  Unclear etiology as morphology.  Sinus.  Initially started amiodarone infusion however patient then dropped her blood pressure and became febrile.  Was transferred to the ICU overnight due to declining blood pressures despite aggressive fluid resuscitation.  Patient now in ICU.  Evidence of septic shock secondary to Klebsiella bacteremia.  On IV cefepime.  Mentating clearly.  Unclear source of sepsis.   5/23: Dynamics improving.  The exact etiology of the Klebsiella bacteremia is unclear.  Urinalysis did not grow any bacteria.  Case has been discussed with PCCM.  The Klebsiella pneumonia is the same bacteria that was found on tracheal aspirate when patient was intubated.  This raises possibility of another infectious focus somewhere in the body.  Considering TEE and will pursue MRI total spine to rule out spinal abscess.  5/24: Notified by pharmacy that Klebsiella isolated from the blood is demonstrating resistance gene.  Antibiotic switched to meropenem.  Before new antibiotic to be started  patient's clinical condition deteriorated.  She became acutely tachycardic, febrile, tachypneic.  PCCM reengaged for recommendations.  Assessment & Plan:   Principal Problem:   Acute exacerbation of chronic obstructive pulmonary disease (COPD) (HCC) Active Problems:   CAP (community acquired pneumonia)   CAD in native artery   HTN (hypertension)   Tobacco abuse   Hyperlipidemia LDL goal <70   Severe sepsis (HCC)   NSTEMI (non-ST elevated myocardial infarction) (HCC)   Hyperglycemia   SOB (shortness of breath)   Acute respiratory failure (HCC)   HFrEF (heart failure with reduced ejection fraction) (HCC)   Acute on chronic HFrEF (heart failure with reduced ejection fraction) (HCC)   SVT (supraventricular tachycardia) (HCC)   Acute combined systolic and diastolic heart failure (HCC)   Pure hypercholesterolemia  Septic shock Secondary to ESBL Klebsiella bacteremia Unclear source of bacteremia Urinalysis clean MRI total spine negative for abscess Was seemingly responding to IV ceftriaxone/cefepime Deteriorated on 5/24 Placed back on Levophed PCCM/ID following No indication for TEE at this point Plan: Wean off levophed as tolerated, still requiring low dose Continue meropenem with pharmacy dosing for total of 7 days Monitor vitals and fever curve Continue stepdown status for now High risk for deterioration  Acute hypoxic respiratory failure  Acute COPD Exacerbation/left lung multifocal pneumonia Flash pulmonary edema on 4/30 -Intubated for flash pulmonary edema on, extubated 5/7 -Treated with diuretics as well, now off -Improved and stable, weaned off O2 -Discharge planning, CIR declined -Now bed search initiated for skilled nursing facility -[facility] aware and is following for placement options -Patient has had a complicated hospital course and is significantly debilitated.  In my opinion she is unsafe to return home.  She does have recovery potential and  would benefit from  standard short-term rehab.  Per case management appeal process has been initiated. -Appeal has been upheld by [employer].  Will pursue skilled nursing facility at time of discharge.  Disposition plan pending  Sepsis, left lower lobe pneumonia Dysphagia -tracheal aspirate on 4/30 with KLEBSIELLA PNEUMONIAE (resistant to Ampicillin) -Completed 7 days of antibiotics. -Improving -SLP following, continues to have dysphagia, currently on dysphagia 3, honey thick liquids -Will need close SLP FU at rehab to monitor for continued improvement and changes to diet -Currently on honey thick liquids. Feels that po intake is improving  Acute systolic congestive heart failure/Flash pulmonary edema NSTEMI Paroxysmal SVT -Echo noted with drop in EF down to 25-30%  -Cardiology consulted, left heart cath noted nonobstructive coronary disease, recommended medical management and repeat echo in 2 to 3 months -Continueaspirin, Plavix. Metoprolol as tolerated, Entresto currently on hold due to low BP -Follow-up with Dr. Irish Lack in 2 to 3 weeks  Acute metabolic/toxic encephalopathy -ICU stay complicated by severe encephalopathy while on vent -At baseline on buprenorphine/naloxone which was held on admission, now resumed -Also had an MRI brain which was unremarkable -Improved and stable now, PT OT as tolerated   COPD Ongoing tobacco abuse -Counseled, tapered off prednisone, continue duo nebs   DVT prophylaxis: SQ Lovenox Code Status: Full Family Communication: updated patient son at the bedside Disposition Plan: Status is: Inpatient  Remains inpatient appropriate because:Inpatient level of care appropriate due to severity of illness   Dispo: The patient is from: Home              Anticipated d/c is to: CIR              Patient currently is not medically stable to d/c.   Difficult to place patient No   Current sepsis/septic shock secondary to ESBL Klebsiella bacteremia.  Currently in stepdown  status on vasopressor support.  PCCM consulted.  Disposition plan pending.  Level ofcare: Stepdown  Consultants:   Palliative care  PCCM  Cardiology  ID  Procedures:   None  Antimicrobials:   Meropenem 5/24>   Subjective: Feels that her po intake is increasing, wants to hold off on any feeding tubes for now, no shortness of breath, no cough or vomiting.  Objective: Vitals:   10/07/20 1100 10/07/20 1200 10/07/20 1300 10/07/20 1400  BP: (!) 148/59 122/61 (!) 111/54   Pulse: 96 92 (!) 111   Resp: 14 12 16  (!) 34  Temp:   98.2 F (36.8 C)   TempSrc:   Axillary   SpO2: 96% 93% 97%   Weight:      Height:        Intake/Output Summary (Last 24 hours) at 10/07/2020 1432 Last data filed at 10/07/2020 1404 Gross per 24 hour  Intake 1137.34 ml  Output 1050 ml  Net 87.34 ml   Filed Weights   10/05/20 0500 10/06/20 0500 10/07/20 0500  Weight: 54.6 kg 54.6 kg 58.1 kg    Examination:  General exam: Alert, awake, oriented x 3 Respiratory system: Clear to auscultation. Respiratory effort normal. Cardiovascular system:RRR. No murmurs, rubs, gallops. Gastrointestinal system: Abdomen is nondistended, soft and nontender. No organomegaly or masses felt. Normal bowel sounds heard. Central nervous system: Alert and oriented. No focal neurological deficits. Extremities: No C/C/E, +pedal pulses Skin: No rashes, lesions or ulcers Psychiatry: Judgement and insight appear normal. Mood & affect appropriate.     Data Reviewed: I have personally reviewed following labs and imaging studies  CBC: Recent Labs  Lab 10/03/20 4462 10/04/20 0407 10/05/20 0325 10/06/20 0413 10/07/20 0438  WBC 8.6 5.5 5.6 11.2* 8.6  NEUTROABS 6.4 3.4 4.0 7.4 4.9  HGB 11.6* 10.4* 10.0* 10.6* 9.6*  HCT 35.8* 32.5* 30.7* 32.7* 29.8*  MCV 93.5 94.5 93.6 93.2 93.4  PLT 305 270 211 255 863   Basic Metabolic Panel: Recent Labs  Lab 10/03/20 0822 10/04/20 0407 10/05/20 0325 10/06/20 0413  10/07/20 0438  NA 138 141 134* 139 139  140  K 3.5 3.5 4.1 4.3 3.7  3.7  CL 105 108 101 101 104  105  CO2 25 26 26 29 26  27   GLUCOSE 136* 123* 101* 117* 111*  111*  BUN 10 8 10 12 12  12   CREATININE 0.44 0.44 0.49 0.66 0.54  0.50  CALCIUM 8.4* 8.4* 8.2* 8.4* 8.3*  8.4*  MG 1.7 1.8 2.1 2.3  --   PHOS 3.3 2.9 2.5 3.5 3.3   GFR: Estimated Creatinine Clearance: 47.2 mL/min (by C-G formula based on SCr of 0.5 mg/dL). Liver Function Tests: Recent Labs  Lab 10/02/20 1736 10/03/20 0822 10/07/20 0438  AST 27  --   --   ALT 28  --   --   ALKPHOS 77  --   --   BILITOT 0.6  --   --   PROT 6.4*  --   --   ALBUMIN 2.8* 2.5* 2.3*   No results for input(s): LIPASE, AMYLASE in the last 168 hours. No results for input(s): AMMONIA in the last 168 hours. Coagulation Profile: Recent Labs  Lab 10/02/20 1736  INR 1.4*   Cardiac Enzymes: No results for input(s): CKTOTAL, CKMB, CKMBINDEX, TROPONINI in the last 168 hours. BNP (last 3 results) No results for input(s): PROBNP in the last 8760 hours. HbA1C: No results for input(s): HGBA1C in the last 72 hours. CBG: Recent Labs  Lab 10/06/20 1544 10/06/20 1915 10/07/20 0004 10/07/20 0402 10/07/20 1153  GLUCAP 137* 139* 120* 114* 129*   Lipid Profile: No results for input(s): CHOL, HDL, LDLCALC, TRIG, CHOLHDL, LDLDIRECT in the last 72 hours. Thyroid Function Tests: No results for input(s): TSH, T4TOTAL, FREET4, T3FREE, THYROIDAB in the last 72 hours. Anemia Panel: No results for input(s): VITAMINB12, FOLATE, FERRITIN, TIBC, IRON, RETICCTPCT in the last 72 hours. Sepsis Labs: Recent Labs  Lab 10/02/20 1736 10/02/20 1824 10/02/20 2130 10/06/20 0413 10/07/20 0438  PROCALCITON 1.93  --   --  39.42 23.89  LATICACIDVEN  --  2.4* 1.1  --   --     Recent Results (from the past 240 hour(s))  Culture, blood (x 2)     Status: Abnormal   Collection Time: 10/02/20  6:24 PM   Specimen: BLOOD  Result Value Ref Range Status    Specimen Description   Final    BLOOD BLOOD RIGHT HAND Performed at [facility], [address] [place], [place] [address]    Special Requests   Final    BOTTLES DRAWN AEROBIC AND ANAEROBIC Blood Culture adequate volume Performed at [facility], [address] [place], [place] [address]    Culture  Setup Time   Final    GRAM NEGATIVE RODS IN BOTH AEROBIC AND ANAEROBIC BOTTLES CRITICAL RESULT CALLED TO, READ BACK BY AND VERIFIED WITH: NATHAN BELUE AT [place] 10/03/2020 [place]. Performed at [facility], [place] [address] [place], [place] [address]    Culture (A)  Final    KLEBSIELLA PNEUMONIAE Confirmed Extended Spectrum Beta-Lactamase Producer (ESBL).  In bloodstream infections from ESBL organisms, carbapenems are preferred over piperacillin/tazobactam. They are shown to have a lower risk of mortality.    Report Status 10/05/2020 FINAL  Final   Organism ID, Bacteria KLEBSIELLA PNEUMONIAE  Final      Susceptibility   Klebsiella pneumoniae - MIC*    AMPICILLIN >=32 RESISTANT Resistant     CEFAZOLIN >=64 RESISTANT Resistant     CEFEPIME 8 INTERMEDIATE Intermediate     CEFTAZIDIME >=64 RESISTANT Resistant     CEFTRIAXONE >=64 RESISTANT Resistant     CIPROFLOXACIN <=0.25 SENSITIVE Sensitive     GENTAMICIN <=1 SENSITIVE Sensitive     IMIPENEM <=0.25 SENSITIVE Sensitive     TRIMETH/SULFA <=20 SENSITIVE Sensitive     AMPICILLIN/SULBACTAM >=32 RESISTANT Resistant     PIP/TAZO >=128 RESISTANT Resistant     * KLEBSIELLA PNEUMONIAE  Blood Culture ID Panel (Reflexed)     Status: Abnormal   Collection Time: 10/02/20  6:24 PM  Result Value Ref Range Status   Enterococcus faecalis NOT DETECTED NOT DETECTED Final   Enterococcus Faecium NOT DETECTED NOT DETECTED Final   Listeria monocytogenes NOT DETECTED NOT DETECTED Final   Staphylococcus species NOT DETECTED NOT DETECTED Final   Staphylococcus aureus (BCID) NOT DETECTED NOT DETECTED Final   Staphylococcus  epidermidis NOT DETECTED NOT DETECTED Final   Staphylococcus lugdunensis NOT DETECTED NOT DETECTED Final   Streptococcus species NOT DETECTED NOT DETECTED Final   Streptococcus agalactiae NOT DETECTED NOT DETECTED Final   Streptococcus pneumoniae NOT DETECTED NOT DETECTED Final   Streptococcus pyogenes NOT DETECTED NOT DETECTED Final   A.calcoaceticus-baumannii NOT DETECTED NOT DETECTED Final   Bacteroides fragilis NOT DETECTED NOT DETECTED Final   Enterobacterales DETECTED (A) NOT DETECTED Final    Comment: Enterobacterales represent a large order of gram negative bacteria, not a single organism. CRITICAL RESULT CALLED TO, READ BACK BY AND VERIFIED WITH: NATHAN BELUE AT 0708 ON 10/03/2020 [place].    Enterobacter cloacae complex NOT DETECTED NOT DETECTED Final   Escherichia coli NOT DETECTED NOT DETECTED Final   Klebsiella aerogenes NOT DETECTED NOT DETECTED Final   Klebsiella oxytoca NOT DETECTED NOT DETECTED Final   Klebsiella pneumoniae DETECTED (A) NOT DETECTED Corrected    Comment: CORRECTED ON 05/22 AT 0825: PREVIOUSLY REPORTED AS DETECTED CRITICAL RESULT CALLED TO, READ BACK BY AND VERIFIED WITH: NATHAN BELUE AT 0708 ON 10/03/2020 [place].   Proteus species NOT DETECTED NOT DETECTED Final   Salmonella species NOT DETECTED NOT DETECTED Final   Serratia marcescens NOT DETECTED NOT DETECTED Final   Haemophilus influenzae NOT DETECTED NOT DETECTED Final   Neisseria meningitidis NOT DETECTED NOT DETECTED Final   Pseudomonas aeruginosa NOT DETECTED NOT DETECTED Final   Stenotrophomonas maltophilia NOT DETECTED NOT DETECTED Final   Candida albicans NOT DETECTED NOT DETECTED Final   Candida auris NOT DETECTED NOT DETECTED Final   Candida glabrata NOT DETECTED NOT DETECTED Final   Candida krusei NOT DETECTED NOT DETECTED Final   Candida parapsilosis NOT DETECTED NOT DETECTED Final   Candida tropicalis NOT DETECTED NOT DETECTED Final   Cryptococcus neoformans/gattii NOT DETECTED NOT DETECTED  Final   CTX-M ESBL NOT DETECTED NOT DETECTED Final   Carbapenem resistance IMP NOT DETECTED NOT DETECTED Final   Carbapenem resistance KPC NOT DETECTED NOT DETECTED Final   Carbapenem resistance NDM NOT DETECTED NOT DETECTED Final   Carbapenem resist OXA 48 LIKE NOT DETECTED NOT DETECTED Final   Carbapenem resistance VIM NOT DETECTED NOT DETECTED Final    Comment:  Performed at [facility], [address] [place], [place] [address]  Culture, blood (x 2)     Status: Abnormal   Collection Time: 10/02/20  6:32 PM   Specimen: BLOOD  Result Value Ref Range Status   Specimen Description   Final    BLOOD RIGHT ANTECUBITAL Performed at [facility], [address] [place], [place] [address]    Special Requests   Final    BOTTLES DRAWN AEROBIC AND ANAEROBIC Blood Culture results may not be optimal due to an excessive volume of blood received in culture bottles Performed at [facility], [address], [place], [place] [address]    Culture  Setup Time   Final    GRAM NEGATIVE RODS IN BOTH AEROBIC AND ANAEROBIC BOTTLES CRITICAL VALUE NOTED.  VALUE IS CONSISTENT WITH PREVIOUSLY REPORTED AND CALLED VALUE. Performed at [facility], [address] [place], [place] [address]    Culture (A)  Final    KLEBSIELLA PNEUMONIAE SUSCEPTIBILITIES PERFORMED ON PREVIOUS CULTURE WITHIN THE LAST 5 DAYS. Performed at [facility], [place] [address] [place], [place] [address]    Report Status 10/05/2020 FINAL  Final  Culture, Urine     Status: None   Collection Time: 10/03/20  8:52 AM   Specimen: Urine, Clean Catch  Result Value Ref Range Status   Specimen Description   Final    URINE, CLEAN CATCH Performed at [facility], [address] [place], [place] [address]    Special Requests   Final    NONE Performed at [facility], [address] [place], [place] [address]    Culture   Final    NO GROWTH Performed at [place], [place] [address] [place],  [address]    Report Status 10/04/2020 FINAL  Final         Radiology Studies: No results found.      Scheduled Meds: . aspirin  81 mg Oral QHS  . buprenorphine-naloxone  2 tablet Sublingual Daily  . Chlorhexidine Gluconate Cloth  6 each Topical QHS  . clopidogrel  75 mg Oral QHS  . fluticasone  1 spray Each Nare Daily  . insulin aspart  0-15 Units Subcutaneous Q4H  . levothyroxine  88 mcg Oral Q0600  . loratadine  10 mg Oral Daily  . mouth rinse  15 mL Mouth Rinse BID  . metoprolol tartrate  25 mg Oral BID  . midodrine  10 mg Oral TID WC  . mometasone-formoterol  2 puff Inhalation BID  . multivitamin with minerals  1 tablet Oral Daily  . pantoprazole  40 mg Oral Daily  . rosuvastatin  40 mg Oral QHS  . sodium chloride flush  3 mL Intravenous Q12H  . sodium chloride flush  3 mL Intravenous Q12H   Continuous Infusions: . sodium chloride Stopped (09/24/20 1321)  . sodium chloride Stopped (10/06/20 1418)  . meropenem (MERREM) IV Stopped (10/07/20 1333)  . norepinephrine (LEVOPHED) Adult infusion Stopped (10/07/20 1259)     LOS: 27 days    Time spent: 35 minutes    Kathie Dike, MD [facility]  If 7PM-7AM, please contact night-coverage 10/07/2020, 2:32 PM

## 2020-10-08 DIAGNOSIS — I214 Non-ST elevation (NSTEMI) myocardial infarction: Secondary | ICD-10-CM | POA: Diagnosis not present

## 2020-10-08 DIAGNOSIS — J441 Chronic obstructive pulmonary disease with (acute) exacerbation: Secondary | ICD-10-CM | POA: Diagnosis not present

## 2020-10-08 DIAGNOSIS — J9601 Acute respiratory failure with hypoxia: Secondary | ICD-10-CM | POA: Diagnosis not present

## 2020-10-08 DIAGNOSIS — R0603 Acute respiratory distress: Secondary | ICD-10-CM | POA: Diagnosis not present

## 2020-10-08 DIAGNOSIS — I5041 Acute combined systolic (congestive) and diastolic (congestive) heart failure: Secondary | ICD-10-CM | POA: Diagnosis not present

## 2020-10-08 DIAGNOSIS — J189 Pneumonia, unspecified organism: Secondary | ICD-10-CM | POA: Diagnosis not present

## 2020-10-08 LAB — BASIC METABOLIC PANEL
Anion gap: 8 (ref 5–15)
BUN: 8 mg/dL (ref 8–23)
CO2: 26 mmol/L (ref 22–32)
Calcium: 8.3 mg/dL — ABNORMAL LOW (ref 8.9–10.3)
Chloride: 106 mmol/L (ref 98–111)
Creatinine, Ser: 0.47 mg/dL (ref 0.44–1.00)
GFR, Estimated: >60 mL/min (ref >60)
Glucose, Bld: 96 mg/dL (ref 70–99)
Potassium: 3.5 mmol/L (ref 3.5–5.1)
Sodium: 140 mmol/L (ref 135–145)

## 2020-10-08 LAB — CBC WITH DIFFERENTIAL/PLATELET
Abs Immature Granulocytes: 0.03 10*3/uL (ref 0.00–0.07)
Basophils Absolute: 0 10*3/uL (ref 0.0–0.1)
Basophils Relative: 0 %
Eosinophils Absolute: 0.2 10*3/uL (ref 0.0–0.5)
Eosinophils Relative: 3 %
HCT: 28.3 % — ABNORMAL LOW (ref 36.0–46.0)
Hemoglobin: 9.1 g/dL — ABNORMAL LOW (ref 12.0–15.0)
Immature Granulocytes: 0 %
Lymphocytes Relative: 38 %
Lymphs Abs: 2.6 10*3/uL (ref 0.7–4.0)
MCH: 30.4 pg (ref 26.0–34.0)
MCHC: 32.2 g/dL (ref 30.0–36.0)
MCV: 94.6 fL (ref 80.0–100.0)
Monocytes Absolute: 0.6 10*3/uL (ref 0.1–1.0)
Monocytes Relative: 9 %
Neutro Abs: 3.4 10*3/uL (ref 1.7–7.7)
Neutrophils Relative %: 50 %
Platelets: 249 10*3/uL (ref 150–400)
RBC: 2.99 MIL/uL — ABNORMAL LOW (ref 3.87–5.11)
RDW: 14.6 % (ref 11.5–15.5)
Smear Review: NORMAL
WBC: 6.9 10*3/uL (ref 4.0–10.5)
nRBC: 0 % (ref 0.0–0.2)

## 2020-10-08 LAB — GLUCOSE, CAPILLARY
Glucose-Capillary: 111 mg/dL — ABNORMAL HIGH (ref 70–99)
Glucose-Capillary: 99 mg/dL (ref 70–99)
Glucose-Capillary: 94 mg/dL (ref 70–99)
Glucose-Capillary: 122 mg/dL — ABNORMAL HIGH (ref 70–99)
Glucose-Capillary: 82 mg/dL (ref 70–99)
Glucose-Capillary: 127 mg/dL — ABNORMAL HIGH (ref 70–99)

## 2020-10-08 LAB — PROCALCITONIN: Procalcitonin: 7.81 ng/mL

## 2020-10-08 MED ORDER — POTASSIUM CHLORIDE CRYS ER 20 MEQ PO TBCR
40.0000 meq | EXTENDED_RELEASE_TABLET | ORAL | Status: AC
  Administered 2020-10-08 (×2): 40 meq via ORAL
  Filled 2020-10-08 (×2): qty 2

## 2020-10-08 NOTE — Progress Notes (Signed)
Progress Note  Patient Name: Kathryn Garrett Date of Encounter: 10/08/2020  [facility] Cardiologist: Larae Grooms, MD   Subjective   No complaints, sitting up in a recliner, reports that she feels well  Telemetry reviewed, increased heart rate when getting out of bed, washing up, eating, sinus tachycardia Telemetry showing rate 135 bpm, settling back to 100 bpm at rest Did not receive metoprolol yesterday secondary to low blood pressure Blood pressure improved today, did receive metoprolol  Inpatient Medications    Scheduled Meds: . aspirin  81 mg Oral QHS  . buprenorphine-naloxone  2 tablet Sublingual Daily  . Chlorhexidine Gluconate Cloth  6 each Topical QHS  . clopidogrel  75 mg Oral QHS  . fluticasone  1 spray Each Nare Daily  . insulin aspart  0-15 Units Subcutaneous Q4H  . levothyroxine  88 mcg Oral Q0600  . loratadine  10 mg Oral Daily  . mouth rinse  15 mL Mouth Rinse BID  . metoprolol tartrate  25 mg Oral BID  . midodrine  10 mg Oral TID WC  . mometasone-formoterol  2 puff Inhalation BID  . multivitamin with minerals  1 tablet Oral Daily  . pantoprazole  40 mg Oral Daily  . rosuvastatin  40 mg Oral QHS  . sodium chloride flush  3 mL Intravenous Q12H  . sodium chloride flush  3 mL Intravenous Q12H   Continuous Infusions: . sodium chloride Stopped (09/24/20 1321)  . sodium chloride Stopped (10/06/20 1418)  . meropenem (MERREM) IV 1 g (10/08/20 0544)  . norepinephrine (LEVOPHED) Adult infusion Stopped (10/07/20 1259)   PRN Meds: acetaminophen **OR** acetaminophen, albuterol, artificial tears, docusate, guaiFENesin, ondansetron (ZOFRAN) IV, polyethylene glycol   Vital Signs    Vitals:   10/08/20 1000 10/08/20 1015 10/08/20 1030 10/08/20 1045  BP: (!) 107/48 (!) 105/53 115/62 (!) 115/49  Pulse: (!) 112 (!) 109 (!) 118 (!) 109  Resp: (!) 26 (!) 22 18 20   Temp:      TempSrc:      SpO2:    96%  Weight:      Height:        Intake/Output  Summary (Last 24 hours) at 10/08/2020 1440 Last data filed at 10/07/2020 1557 Gross per 24 hour  Intake --  Output 50 ml  Net -50 ml   Last 3 Weights 10/08/2020 10/07/2020 10/06/2020  Weight (lbs) 119 lb 0.8 oz 128 lb 1.4 oz 120 lb 5.9 oz  Weight (kg) 54 kg 58.1 kg 54.6 kg      Telemetry    Sinus tachycardia rate 90 up to 140 bpm  ECG     - Personally Reviewed  Physical Exam   Constitutional:  oriented to person, place, and time. No distress.  HENT:  Head: Grossly normal Eyes:  no discharge. No scleral icterus.  Neck: No JVD, no carotid bruits  Cardiovascular: Regular rate and rhythm, no murmurs appreciated Pulmonary/Chest: Clear to auscultation bilaterally, no wheezes or rails Abdominal: Soft.  no distension.  no tenderness.  Musculoskeletal: Normal range of motion Neurological:  normal muscle tone. Coordination normal. No atrophy Skin: Skin warm and dry Psychiatric: normal affect, pleasant   Labs    High Sensitivity Troponin:   Recent Labs  Lab 09/10/20 2050 09/11/20 0437 09/11/20 1433 09/11/20 1611 09/11/20 2011  TROPONINIHS 1,838* 2,434* 2,265* 2,455* 2,317*      Chemistry Recent Labs  Lab 10/02/20 1736 10/03/20 3710 10/04/20 0407 10/06/20 0413 10/07/20 0438 10/08/20 0519  NA 134* 138   < >  139 139  140 140  K 3.9 3.5   < > 4.3 3.7  3.7 3.5  CL 99 105   < > 101 104  105 106  CO2 26 25   < > 29 26  27 26   GLUCOSE 142* 136*   < > 117* 111*  111* 96  BUN 12 10   < > 12 12  12 8   CREATININE 0.58 0.44   < > 0.66 0.54  0.50 0.47  CALCIUM 8.5* 8.4*   < > 8.4* 8.3*  8.4* 8.3*  PROT 6.4*  --   --   --   --   --   ALBUMIN 2.8* 2.5*  --   --  2.3*  --   AST 27  --   --   --   --   --   ALT 28  --   --   --   --   --   ALKPHOS 77  --   --   --   --   --   BILITOT 0.6  --   --   --   --   --   GFRNONAA >60 >60   < > >60 >60  >60 >60  ANIONGAP 9 8   < > 9 9  8 8    < > = values in this interval not displayed.     Hematology Recent Labs  Lab  10/06/20 0413 10/07/20 0438 10/08/20 0519  WBC 11.2* 8.6 6.9  RBC 3.51* 3.19* 2.99*  HGB 10.6* 9.6* 9.1*  HCT 32.7* 29.8* 28.3*  MCV 93.2 93.4 94.6  MCH 30.2 30.1 30.4  MCHC 32.4 32.2 32.2  RDW 14.6 14.5 14.6  PLT 255 258 249    BNPNo results for input(s): BNP, PROBNP in the last 168 hours.   DDimer  Recent Labs  Lab 10/02/20 1736  DDIMER 3.61*     Radiology    No results found.  Cardiac Studies     Patient Profile     75 y.o. female with CAD status post RCA PCI in 2017 and in-stent restenosis requiring PCI, VF arrest,, chronic systolic diastolic heart failure, COPD, ongoing tobacco abuse, hypertension, hyperlipidemia, COPD, and narcotic abuse admitted with COPD exacerbation and Klebsiella pneumonia as well as acute on chronic systolic and diastolic heart failure.  -- elevated troponin. Echo : EF 25 to 30% -- cardiogenic shock requiring milrinone for diuresis and hypoxic respiratory failure requiring intubation.  left and right heart cath on 5/13 ,  mild to moderate nonobstructive CAD and normal filling pressures.   SVT ,  managed with metoprolol.   Assessment & Plan    SVT/Sinus tachycardia -Notes indicating heart rates up to 150 on May 21, sinus tachycardia -Amiodarone initially started was discontinued secondary to low blood pressure -Currently off pressors, blood pressure improving, Received metoprolol this morning, blood pressure stable -We will look to advance her metoprolol as she continues to improve, with transition to metoprolol succinate or bisoprolol  Bacteremia/sepsis with hypotension on pressors Enterobacter and Klebsiella pneumonia on blood cultures,  mentating better On broad-spectrum antibiotics Sepsis picture slowly improving, blood pressure stabilizing, off pressors  CAD s/p PCI Elevated troponin - cardiac cath showed nonobstructive diseaseabove - continue ASA, Plavix, statin Management of beta-blocker as above  Acute systolic and  diastolic heart failure - LVEF reduced to 25-30% Catheterization has been completed, no intervention needed --Now off pressors, looking to transition from metoprolol to tartrate to metoprolol succinate as tolerated or  bisoprolol -If blood pressure tolerates over the weekend could initiate ARB/Entresto Next week consider adding Farxiga/Jardiance    Total encounter time more than 35 minutes  Greater than 50% was spent in counseling and coordination of care with the patient   For questions or updates, please contact [place] Please consult [email or web address] for contact info under        Signed, Ida Rogue, MD  10/08/2020, 2:40 PM

## 2020-10-08 NOTE — Progress Notes (Signed)
NAME:  Kathryn Garrett, MRN:  [id number], DOB:  Nov 25, 1945, LOS: 66 ADMISSION DATE:  09/10/2020, INITIAL CONSULTATION DATE:  09/11/2020 REFERRING MD: Dr. Damita Dunnings, CHIEF COMPLAINT: SOB  Brief Patient Description  Kathryn Garrett is a 75F with CAD status post RCA PCI in 2017 and in-stent restenosis requiring PCI, VF arrest,, chronic systolic diastolic heart failure, COPD, ongoing tobacco abuse, hypertension, hyperlipidemia, COPD, and narcotic abuse admitted with COPD exacerbation and Klebsiella pneumonia as well as acute on chronic systolic and diastolic heart failure.  Hospital course was complicated by cardiogenic shock requiring milrinone for diuresis and hypoxic respiratory failure requiring intubation. She was treated with Cefepime/Ancef for aspiration. She underwent left and right heart cath on 5/13 and was found to have mild to moderate nonobstructive CAD and normal filling pressures. She also had SVT that was managed with metoprolol. Patient was extubated and transferred to the floor. On 5/21 she was noted to be hypotensive despite IVFs and in sinus tachycardia to the 150s.  Lacate was 2.4.  Blood cultures with GNRs.  WBC was 14.8.  No significant infection in urine. CXR was unremarkable.  She was treated with Cefepime for Klebsiella pneumonia and transferred to ICU.  SHe was started on levophed for septic shock and PCCM re-consulted to assist with management.  Pertinent  Medical History  CAD status post RCA PCI in 2017 and in-stent restenosis requiring PCI,  VF arrest,,  chronic systolic diastolic heart failure,  COPD,  ongoing tobacco abuse,  hypertension,  hyperlipidemia,  Chronic narcotic abuse    Significant Hospital Events: Including procedures, antibiotic start and stop dates in addition to other pertinent events   09/10/2020-patient admits admitted with hospitalist service for COPD exacerbation and left lower lobe pneumonia 09/11/2020-overnight patient had suspected flash pulmonary  edema, dyspnea followed by somnolence requiring emergent intubation and mechanical ventilation.  Admitted to ICU 09/12/20- patient failed SBT , she had blood tinged secretions fromETT and aggitation, family was at bedside and we agreed on giving her more time and try again.  09/14/20- Failed SBT (became hypoxic with sats mid 80's, increased WOB and assessory muscle use, HR 140-160's). Will diurese today and add Metoprolol.  Tracheal aspirate from 4/30 with KLEBSIELLA PNEUMONIAE (resistant to Ampicillin)   09/15/20- Failed SBT (increased WOB and assessory muscle use, RR 40's, HR 140-150's, Hypertensive); plan to add scheduled PO Klonopin and Oxycodone, Lasix 40 mg IV x1, consult Palliative Care 09/16/20- Worsening Leukocytosis up to 21.7, low grade fever overnight, increased secretions overnight, will repeat Tracheal aspirate, remove central line, add mucinex, plan for SBT 09/17/20-[name] passed SBT in AM and following commands still having some moderate secretions 09/18/20-  Extubated yesterday.  Failed swallow eval today.  09/20/20- Weaned to room air, Leukocytosis improving; Hemodynamically stable 09/27/20- patient is coughing up phlegm but clinically is improved, we discussed pneumonia and COPD with outpatient clinic follow up with pulmonology.  There is plan for post dc rehab 09/28/20- patient is improved and she is being optimized for CIR, pulmonary will sign off and can follow up on outpatient basis 09/29/20- family at bedside today.  We discussed barium swallow with aspiration.  09/30/20- patient had PT today she walked today without need of additional supplemental O2 therapy.  She generally has been walking ok but has had severe LE weakness here.  She ate lunch infront of me had cough after couple bites.  We disucssed aspiartion risk and her barium swallow.  10/01/20: PCCM signed off  10/02/20: Pt with hypotension despite aggressive fluid resuscitation  concerning for sepsis secondary to UTI requiring levophed  gtt and transfer to stepdown unit.  PCCM signed back on case to assist with management  10/03/20: Remains on Levophed 10/04/20: Weaning down Levophed, increase Midodrine to 10 mg TID; consult ID for Bacteremia  10/05/20: Levophed weaned off, MRI spine yesterday negative for osteomyelitis discitis or septic arthritis, no epidural abscess or other fluid collection. Klebsiella is +ESBL.  ABX changed to Meropenem. 10/06/20: Deteriorated yesterday (fever, tachycardic, yesterday), Klebsiella ESBL +, ABX changed to Meropenem, requiring low dose Levophed again (currently on 5 mcg), ID following 10/06/20: Slowly weaning Levophed (currently at 3 mcg), WBC & Procalcitonin improving  10/07/20- patient is clinically improved, she is speaking and is oriented x3. Optimizing medically, vitals stabalizing, Procalcitonin trending down appropriately, Wbc count wnl, bmp is much improved, residual anemia on CBC.  PT/OT today  Cultures:  4/29: SARS-CoV-2 PCR>> negative 4/29:Influenza PCR>> negative 5/1: Strep pneumo urinary antigen>>negative 5/1: Legionella urinary antigen>>negative 5/5: Tracheal aspirate> candida albicans 5/21: Blood culture x2>>3 out 4 bottles growing GNR BCID positive for klebsiella pneumoniae (+ESBL) 5/21: UCx> negative  Antimicrobials:  Ceftriaxone 5/22>>5/24 Meropenem 5/24>> Cefepime 5/21 x 1 dose Metronidazole 5/21 x 1 dose   OBJECTIVE   Blood pressure (!) 109/52, pulse (!) 109, temperature 98.1 F (36.7 C), temperature source Oral, resp. rate 15, height 4\' 11"  (1.499 m), weight 54 kg, SpO2 96 %.        Intake/Output Summary (Last 24 hours) at 10/08/2020 0937 Last data filed at 10/07/2020 1557 Gross per 24 hour  Intake 140.74 ml  Output 550 ml  Net -409.26 ml   Filed Weights   10/06/20 0500 10/07/20 0500 10/08/20 0500  Weight: 54.6 kg 58.1 kg 54 kg   Physical Examination: GENERAL:age appropriate 75 year-old patient, sitting in bed, no acute distress.  EYES: Pupils PERRLA. No  scleral icterus. Extraocular muscles intact.  HEENT: Head atraumatic, normocephalic. Oropharynx and nasopharynx clear.  NECK:  Supple, no jugular venous distention. No thyroid enlargement, no tenderness.  LUNGS: Clear breath sounds bilaterally, no wheezing, rales,rhonchi or crepitation. No use of accessory muscles of respiration.  CARDIOVASCULAR: Tachycardia, regular rhythm, S1, S2. No murmurs, rubs, or gallops. 2+ distal pulses ABDOMEN: Soft, nontender, nondistended. Bowel sounds present. No organomegaly or mass.  EXTREMITIES: No pedal edema, cyanosis, or clubbing.  NEUROLOGIC: Cranial nerves II through XII are intact. Muscle strength 5/5 in all extremities. Sensation intact. Gait not checked.  PSYCHIATRIC: The patient is alert and oriented x 4.  SKIN: Warm and dry. No obvious rash, lesion, or ulcer.     Labs   CBC: Recent Labs  Lab 10/04/20 0407 10/05/20 0325 10/06/20 0413 10/07/20 0438 10/08/20 0519  WBC 5.5 5.6 11.2* 8.6 6.9  NEUTROABS 3.4 4.0 7.4 4.9 3.4  HGB 10.4* 10.0* 10.6* 9.6* 9.1*  HCT 32.5* 30.7* 32.7* 29.8* 28.3*  MCV 94.5 93.6 93.2 93.4 94.6  PLT 270 211 255 258 562    Basic Metabolic Panel: Recent Labs  Lab 10/03/20 0822 10/04/20 0407 10/05/20 0325 10/06/20 0413 10/07/20 0438 10/08/20 0519  NA 138 141 134* 139 139  140 140  K 3.5 3.5 4.1 4.3 3.7  3.7 3.5  CL 105 108 101 101 104  105 106  CO2 25 26 26 29 26  27 26   GLUCOSE 136* 123* 101* 117* 111*  111* 96  BUN 10 8 10 12 12  12 8   CREATININE 0.44 0.44 0.49 0.66 0.54  0.50 0.47  CALCIUM 8.4* 8.4* 8.2* 8.4* 8.3*  8.4* 8.3*  MG 1.7 1.8 2.1 2.3  --   --   PHOS 3.3 2.9 2.5 3.5 3.3  --    GFR: Estimated Creatinine Clearance: 45.6 mL/min (by C-G formula based on SCr of 0.47 mg/dL). Recent Labs  Lab 10/02/20 1736 10/02/20 1823 10/02/20 1824 10/02/20 2130 10/03/20 8546 10/05/20 0325 10/06/20 0413 10/07/20 0438 10/08/20 0519  PROCALCITON 1.93  --   --   --   --   --  39.42 23.89 7.81  WBC   --    < >  --   --    < > 5.6 11.2* 8.6 6.9  LATICACIDVEN  --   --  2.4* 1.1  --   --   --   --   --    < > = values in this interval not displayed.    Liver Function Tests: Recent Labs  Lab 10/02/20 1736 10/03/20 0822 10/07/20 0438  AST 27  --   --   ALT 28  --   --   ALKPHOS 77  --   --   BILITOT 0.6  --   --   PROT 6.4*  --   --   ALBUMIN 2.8* 2.5* 2.3*   No results for input(s): LIPASE, AMYLASE in the last 168 hours. No results for input(s): AMMONIA in the last 168 hours.  ABG    Component Value Date/Time   PHART 7.48 (H) 10/05/2020 1000   PCO2ART 28 (L) 10/05/2020 1000   PO2ART 66 (L) 10/05/2020 1000   HCO3 20.9 10/05/2020 1000   TCO2 23 01/14/2009 0327   ACIDBASEDEF 1.6 10/05/2020 1000   O2SAT 94.2 10/05/2020 1000     Coagulation Profile: Recent Labs  Lab 10/02/20 1736  INR 1.4*    Cardiac Enzymes: No results for input(s): CKTOTAL, CKMB, CKMBINDEX, TROPONINI in the last 168 hours.  HbA1C: Hgb A1c MFr Bld  Date/Time Value Ref Range Status  09/11/2020 04:37 AM 6.4 (H) 4.8 - 5.6 % Final    Comment:    (NOTE) Pre diabetes:          5.7%-6.4%  Diabetes:              >6.4%  Glycemic control for   <7.0% adults with diabetes   10/02/2019 12:28 PM 6.4 (H) 4.8 - 5.6 % Final    Comment:             Prediabetes: 5.7 - 6.4          Diabetes: >6.4          Glycemic control for adults with diabetes: <7.0     CBG: Recent Labs  Lab 10/07/20 1548 10/07/20 1921 10/07/20 2319 10/08/20 0418 10/08/20 0732  GLUCAP 111* 102* 108* 99 94    Allergies Allergies  Allergen Reactions  . Azithromycin Rash     Home Medications  Prior to Admission medications   Medication Sig Start Date End Date Taking? Authorizing Provider  acetaminophen (TYLENOL) 500 MG tablet Take 1,000 mg by mouth every 6 (six) hours as needed for headache.   Yes [provider]  albuterol (PROVENTIL HFA;VENTOLIN HFA) 108 (90 Base) MCG/ACT inhaler Inhale 2 puffs into the lungs  every 4 (four) hours as needed for wheezing or shortness of breath.   Yes [provider]  amoxicillin (AMOXIL) 500 MG tablet Take 500 mg by mouth 3 (three) times daily. 08/03/20  Yes [provider]  aspirin EC 81 MG tablet Take 81 mg by mouth at  bedtime.    Yes [provider]  azelastine (ASTELIN) 0.1 % nasal spray USE 2 SPRAYS BID UNTIL DIRECTED TO STOP. USE FOR RUNNY NOSE 08/14/17  Yes [provider]  Buprenorphine HCl-Naloxone HCl 8-2 MG FILM Place 0.5 Film under the tongue daily.   Yes [provider]  cetirizine (ZYRTEC) 10 MG tablet Take 1 tablet (10 mg total) by mouth daily. 03/11/18  Yes Alene Mires, Sahar M, PA-C  clopidogrel (PLAVIX) 75 MG tablet TAKE 1 TABLET(75 MG) BY MOUTH AT BEDTIME 09/16/20  Yes Jettie Booze, MD  dexamethasone (DECADRON) 6 MG tablet dexamethasone 6 mg tablet  TAKE 1 TABLET BY MOUTH EVERY DAY   Yes [provider]  doxycycline (VIBRAMYCIN) 100 MG capsule doxycycline hyclate 100 mg capsule  TAKE 1 CAPSULE BY MOUTH TWICE DAILY   Yes [provider]  fluticasone (FLONASE) 50 MCG/ACT nasal spray Place 1 spray into both nostrils daily.   Yes [provider]  furosemide (LASIX) 40 MG tablet Take 1 tablet (40 mg total) by mouth daily as needed (swelling). 01/16/20  Yes Jettie Booze, MD  ibuprofen (ADVIL,MOTRIN) 600 MG tablet Take 600 mg by mouth as needed for pain. 08/14/17  Yes [provider]  isosorbide mononitrate (IMDUR) 30 MG 24 hr tablet TAKE 1 TABLET(30 MG) BY MOUTH DAILY 09/16/20  Yes Jettie Booze, MD  levothyroxine (SYNTHROID) 88 MCG tablet Take 88 mcg by mouth every morning. 05/26/20  Yes [provider]  metoprolol tartrate (LOPRESSOR) 25 MG tablet TAKE 1 TABLET BY MOUTH TWICE DAILY 09/16/20  Yes Jettie Booze, MD  mupirocin ointment (BACTROBAN) 2 % mupirocin 2 % topical ointment  APPLY TOPICALLY TO LOWER LEG THREE TIMES DAILY FOR 10 DAYS   Yes [provider]  nystatin cream (MYCOSTATIN) Apply topically 2 (two) times daily. 08/11/20  Yes [provider]  potassium chloride SA (KLOR-CON) 20 MEQ tablet Take 1 tablet (20 mEq total) by mouth daily as needed (Take with furosemide (lasix)). 01/16/20  Yes Jettie Booze, MD  predniSONE (DELTASONE) 20 MG tablet prednisone 20 mg tablet   Yes [provider]  rosuvastatin (CRESTOR) 20 MG tablet TAKE 1 TABLET(20 MG) BY MOUTH DAILY 09/16/20  Yes Jettie Booze, MD  TRELEGY ELLIPTA 100-62.5-25 MCG/INH AEPB Take 1 puff by mouth daily. 08/13/20  Yes [provider]  triamcinolone cream (KENALOG) 0.1 % Apply topically 2 (two) times daily. to affected area 08/11/20  Yes [provider]  metoprolol succinate (TOPROL-XL) 50 MG 24 hr tablet Take 1 tablet (50 mg total) by mouth daily. Take with or immediately following a meal. 09/29/20   Domenic Polite, MD  nitroGLYCERIN (NITROSTAT) 0.4 MG SL tablet Place 1 tablet (0.4 mg total) under the tongue every 5 (five) minutes as needed for chest pain. 03/12/18   Jettie Booze, MD  pantoprazole (PROTONIX) 40 MG tablet Take 1 tablet (40 mg total) by mouth daily. 09/29/20   Domenic Polite, MD  sacubitril-valsartan (ENTRESTO) 24-26 MG Take 1 tablet by mouth 2 (two) times daily. 09/28/20   Domenic Polite, MD   Scheduled Meds: . aspirin  81 mg Oral QHS  . buprenorphine-naloxone  2 tablet Sublingual Daily  . Chlorhexidine Gluconate Cloth  6 each Topical QHS  . clopidogrel  75 mg Oral QHS  . fluticasone  1 spray Each Nare Daily  . insulin aspart  0-15 Units Subcutaneous Q4H  . levothyroxine  88 mcg Oral Q0600  . loratadine  10 mg Oral Daily  .  mouth rinse  15 mL Mouth Rinse BID  . metoprolol tartrate  25 mg Oral BID  . midodrine  10 mg Oral TID WC  . mometasone-formoterol  2 puff Inhalation BID  . multivitamin with minerals  1 tablet Oral Daily  . pantoprazole  40 mg Oral Daily  . rosuvastatin  40 mg Oral QHS  . sodium  chloride flush  3 mL Intravenous Q12H  . sodium chloride flush  3 mL Intravenous Q12H   Continuous Infusions: . sodium chloride Stopped (09/24/20 1321)  . sodium chloride Stopped (10/06/20 1418)  . meropenem (MERREM) IV 1 g (10/08/20 0544)  . norepinephrine (LEVOPHED) Adult infusion Stopped (10/07/20 1259)   PRN Meds:.acetaminophen **OR** acetaminophen, albuterol, artificial tears, docusate, guaiFENesin, ondansetron (ZOFRAN) IV, polyethylene glycol  Resolved Hospital Problem list     ASSESSMENT & PLAN   Septic shock     Due to Klebsiella pneumonia    - present on admission  Previous Klebsiella Pneumonia ~ treated (Completed 7 days of Cefepime/Ancef) -Monitor fever curve -Trend WBC's & Procalcitonin -Follow cultures as above -Blood cultures on 10/02/20 with klebsiella pneumoniae & Enterobacteriaceae  -ABX changed to Meropenem 10/05/20 -Urinalysis and urine culture on 5/21 negative (? Could be impacted by previous exposure to antibiotics) -CXR on 5/24 without new/worsening opacities, shows unchanged faint bibasilar opacities consistent with resolving pneumonia -MRI spine 10/04/20 negative for osteomyelitis discitis or septic arthritis within the cervical, thoracic, or lumbar spine. No epidural abscess or other collection. -ID following, appreciate input ~ will follow recommendations ~ recommends 7 days of Meropenem  Acute decompensated systolic congestive heart failure with EF 25%   - exacerbated by Klebsiella infection with pneumonia and sepsis   - cardiology on case - appreciate input-Dr Gollan                                   LVEF reduced to 25-30% Catheterization has been completed, no intervention needed -Metoprolol as blood pressure tolerates above Other cardiomyopathy medications on hold for low blood pressure including Entresto  -Once recovered from sepsis could consider adding Farxiga/Jardiance    -   Sinus tachycardia    Cardiology on case - appreciate input   -Continuous cardiac monitoring -Maintain MAP >72    Acute metabolic/toxic encephalopathy ~ resolved  -Continue outpatient Suboxone due to narcotic abuse hx        Best practice (right click and "Reselect all SmartList Selections" daily)   Diet: DYS 3 Pain/Anxiety/Delirium protocol: not indicated  VAP protocol: not indicated  DVT prophylaxis: SCD and subq lovenox  GI prophylaxis: PPI Glucose control:  SSI Yes Central venous access: n/a Arterial line:  N/A Foley: N/A Mobility: OOB  PT consulted: yes Code Status:  full code Disposition: Stepdown     Critical care provider statement:    Critical care time (minutes):  109   Critical care time was exclusive of:  Separately billable procedures and  treating other patients   Critical care was necessary to treat or prevent imminent or  life-threatening deterioration of the following conditions:  septic shock, klesillla pneumonia, CHF with acute exacerbation, encephalopathy, multiple comorbid conditions.   Critical care was time spent personally by me on the following  activities:  Development of treatment plan with patient or surrogate,  discussions with consultants, evaluation of patient's response to  treatment, examination of patient, obtaining history from patient or  surrogate, ordering and performing treatments and interventions, ordering  and review of laboratory studies and re-evaluation of patient's condition   I assumed direction of critical care for this patient from another  provider in my specialty: no        Ottie Glazier, M.D.  [facility]

## 2020-10-08 NOTE — TOC Progression Note (Addendum)
Transition of Care [facility]) - Progression Note    Patient Details  Name: Kathryn Garrett MRN: [id number] Date of Birth: 1945/08/28  Transition [facility] Care [facility] CM/SW [place], [place] Phone Number: [phone] 10/08/2020, 2:27 PM  Clinical Narrative:     CSW spoke with [facility] for possible SNF placement.  [facility] Hail stated they would not offer placement due to patient's current Suboxone medication prescription. CSW will continue search for SNF placement near [place].   Barriers to Discharge: Continued Medical Work up  Expected Discharge Plan and Services   In-house Referral: Clinical Social Work   Post Acute Care Choice:  [facility]) Living arrangements for the past 2 months: Single Family Home                                       Social Determinants of Health (SDOH) Interventions    Readmission Risk Interventions No flowsheet data found.

## 2020-10-08 NOTE — Progress Notes (Signed)
PROGRESS NOTE    Kathryn Garrett  EXH:[id number] DOB: [birth year] DOA: 09/10/2020 PCP: Imagene Riches, NP   Brief Narrative:  74/F presented to the [facility] ED from home with complaints of shortness of breath -Admitted on 4/29 with hypoxic respiratory failure secondary to COPD exacerbation and community-acquired pneumonia [facility] course was complicated by flash pulmonary edema and NSTEMI requiring mechanical ventilation, intubated on 4/30  Post extubation patient continued to slowly improve.  Initially was slated for CIR however unfortunately insurance denied.  Currently bed search initiated for skilled nursing facility.  Patient remains medically stable for discharge.  Received notification of case management that SNF had been denied by patient's insurance.  Appeal currently in progress.  5/22: Patient had acute onset of tachyarrhythmia yesterday.  Responded to bedside.  Ventricular rates approximately 60.  Unclear etiology as morphology.  Sinus.  Initially started amiodarone infusion however patient then dropped her blood pressure and became febrile.  Was transferred to the ICU overnight due to declining blood pressures despite aggressive fluid resuscitation.  Patient now in ICU.  Evidence of septic shock secondary to Klebsiella bacteremia.  On IV cefepime.  Mentating clearly.  Unclear source of sepsis.   5/23: Dynamics improving.  The exact etiology of the Klebsiella bacteremia is unclear.  Urinalysis did not grow any bacteria.  Case has been discussed with PCCM.  The Klebsiella pneumonia is the same bacteria that was found on tracheal aspirate when patient was intubated.  This raises possibility of another infectious focus somewhere in the body.  Considering TEE and will pursue MRI total spine to rule out spinal abscess.  5/24: Notified by pharmacy that Klebsiella isolated from the blood is demonstrating resistance gene.  Antibiotic switched to meropenem.  Before new antibiotic to be started  patient's clinical condition deteriorated.  She became acutely tachycardic, febrile, tachypneic.  PCCM reengaged for recommendations.  Assessment & Plan:   Principal Problem:   Acute exacerbation of chronic obstructive pulmonary disease (COPD) (HCC) Active Problems:   CAP (community acquired pneumonia)   CAD in native artery   HTN (hypertension)   Tobacco abuse   Hyperlipidemia LDL goal <70   Severe sepsis (HCC)   NSTEMI (non-ST elevated myocardial infarction) (HCC)   Hyperglycemia   SOB (shortness of breath)   Acute respiratory failure (HCC)   HFrEF (heart failure with reduced ejection fraction) (HCC)   Acute on chronic HFrEF (heart failure with reduced ejection fraction) (HCC)   SVT (supraventricular tachycardia) (HCC)   Acute combined systolic and diastolic heart failure (HCC)   Pure hypercholesterolemia  Septic shock Secondary to ESBL Klebsiella bacteremia Unclear source of bacteremia Urinalysis clean MRI total spine negative for abscess Was seemingly responding to IV ceftriaxone/cefepime Deteriorated on 5/24 Placed back on Levophed PCCM/ID following No indication for TEE at this point Plan: Levophed has been weaned off since 5/26 Continue meropenem with pharmacy dosing for total of 7 days No fevers and blood pressure stabiling Still has tachycardia with exertion   Acute hypoxic respiratory failure  Acute COPD Exacerbation/left lung multifocal pneumonia Flash pulmonary edema on 4/30 -Intubated for flash pulmonary edema on, extubated 5/7 -Treated with diuretics as well, now off -Improved and stable, weaned off O2 -Discharge planning, CIR declined -Now bed search initiated for skilled nursing facility -[facility] aware and is following for placement options -Patient has had a complicated hospital course and is significantly debilitated.  In my opinion she is unsafe to return home.  She does have recovery potential and would benefit from standard  short-term rehab.  Per case  management appeal process has been initiated. -Appeal has been upheld by [employer].  Will pursue skilled nursing facility at time of discharge.  Disposition plan pending  Sepsis, left lower lobe pneumonia Dysphagia -tracheal aspirate on 4/30 with KLEBSIELLA PNEUMONIAE (resistant to Ampicillin) -Completed 7 days of antibiotics. -Improving -SLP following, continues to have dysphagia, currently on dysphagia 3, honey thick liquids -Will need close SLP FU at rehab to monitor for continued improvement and changes to diet -Currently on honey thick liquids. Feels that po intake is improving  Acute systolic congestive heart failure/Flash pulmonary edema NSTEMI Paroxysmal SVT -Echo noted with drop in EF down to 25-30%  -Cardiology consulted, left heart cath noted nonobstructive coronary disease, recommended medical management and repeat echo in 2 to 3 months -Continueaspirin, Plavix. Metoprolol as tolerated, Entresto currently on hold due to low BP -Follow-up with Dr. Irish Lack in 2 to 3 weeks  Acute metabolic/toxic encephalopathy -ICU stay complicated by severe encephalopathy while on vent -At baseline on buprenorphine/naloxone which was held on admission, now resumed -Also had an MRI brain which was unremarkable -Improved and stable now, PT OT as tolerated   COPD Ongoing tobacco abuse -Counseled, tapered off prednisone, continue duo nebs   DVT prophylaxis: SQ Lovenox Code Status: Full Family Communication: updated patient daughters at the bedside Disposition Plan: Status is: Inpatient  Remains inpatient appropriate because:Inpatient level of care appropriate due to severity of illness   Dispo: The patient is from: Home              Anticipated d/c is to: SNF              Patient currently is not medically stable to d/c.   Difficult to place patient No   Current sepsis/septic shock secondary to ESBL Klebsiella bacteremia.  Currently in stepdown status on vasopressor support.   PCCM consulted.  Disposition plan pending.  Level ofcare: Progressive Cardiac  Consultants:   Palliative care  PCCM  Cardiology  ID  Procedures:   None  Antimicrobials:   Meropenem 5/24>   Subjective: Seen sitting up in chair. Reports improved po intake. Denies shortness of breath or chest pain  Objective: Vitals:   10/08/20 1330 10/08/20 1345 10/08/20 1400 10/08/20 1415  BP: 111/68 115/66 123/69 114/68  Pulse: 91 91 96 95  Resp: (!) 21 (!) 22 (!) 21 (!) 24  Temp:      TempSrc:      SpO2:      Weight:      Height:        Intake/Output Summary (Last 24 hours) at 10/08/2020 1534 Last data filed at 10/07/2020 1557 Gross per 24 hour  Intake --  Output 50 ml  Net -50 ml   Filed Weights   10/06/20 0500 10/07/20 0500 10/08/20 0500  Weight: 54.6 kg 58.1 kg 54 kg    Examination:  General exam: Alert, awake, oriented x 3 Respiratory system: Clear to auscultation. Respiratory effort normal. Cardiovascular system:RRR. No murmurs, rubs, gallops. Gastrointestinal system: Abdomen is nondistended, soft and nontender. No organomegaly or masses felt. Normal bowel sounds heard. Central nervous system: Alert and oriented. No focal neurological deficits. Extremities: No C/C/E, +pedal pulses Skin: No rashes, lesions or ulcers Psychiatry: Judgement and insight appear normal. Mood & affect appropriate.      Data Reviewed: I have personally reviewed following labs and imaging studies  CBC: Recent Labs  Lab 10/04/20 0407 10/05/20 0325 10/06/20 0413 10/07/20 0438 10/08/20 0519  WBC 5.5  5.6 11.2* 8.6 6.9  NEUTROABS 3.4 4.0 7.4 4.9 3.4  HGB 10.4* 10.0* 10.6* 9.6* 9.1*  HCT 32.5* 30.7* 32.7* 29.8* 28.3*  MCV 94.5 93.6 93.2 93.4 94.6  PLT 270 211 255 258 654   Basic Metabolic Panel: Recent Labs  Lab 10/03/20 0822 10/04/20 0407 10/05/20 0325 10/06/20 0413 10/07/20 0438 10/08/20 0519  NA 138 141 134* 139 139  140 140  K 3.5 3.5 4.1 4.3 3.7  3.7 3.5  CL 105  108 101 101 104  105 106  CO2 25 26 26 29 26  27 26   GLUCOSE 136* 123* 101* 117* 111*  111* 96  BUN 10 8 10 12 12  12 8   CREATININE 0.44 0.44 0.49 0.66 0.54  0.50 0.47  CALCIUM 8.4* 8.4* 8.2* 8.4* 8.3*  8.4* 8.3*  MG 1.7 1.8 2.1 2.3  --   --   PHOS 3.3 2.9 2.5 3.5 3.3  --    GFR: Estimated Creatinine Clearance: 45.6 mL/min (by C-G formula based on SCr of 0.47 mg/dL). Liver Function Tests: Recent Labs  Lab 10/02/20 1736 10/03/20 0822 10/07/20 0438  AST 27  --   --   ALT 28  --   --   ALKPHOS 77  --   --   BILITOT 0.6  --   --   PROT 6.4*  --   --   ALBUMIN 2.8* 2.5* 2.3*   No results for input(s): LIPASE, AMYLASE in the last 168 hours. No results for input(s): AMMONIA in the last 168 hours. Coagulation Profile: Recent Labs  Lab 10/02/20 1736  INR 1.4*   Cardiac Enzymes: No results for input(s): CKTOTAL, CKMB, CKMBINDEX, TROPONINI in the last 168 hours. BNP (last 3 results) No results for input(s): PROBNP in the last 8760 hours. HbA1C: No results for input(s): HGBA1C in the last 72 hours. CBG: Recent Labs  Lab 10/07/20 1921 10/07/20 2319 10/08/20 0418 10/08/20 0732 10/08/20 1117  GLUCAP 102* 108* 99 94 122*   Lipid Profile: No results for input(s): CHOL, HDL, LDLCALC, TRIG, CHOLHDL, LDLDIRECT in the last 72 hours. Thyroid Function Tests: No results for input(s): TSH, T4TOTAL, FREET4, T3FREE, THYROIDAB in the last 72 hours. Anemia Panel: No results for input(s): VITAMINB12, FOLATE, FERRITIN, TIBC, IRON, RETICCTPCT in the last 72 hours. Sepsis Labs: Recent Labs  Lab 10/02/20 1736 10/02/20 1824 10/02/20 2130 10/06/20 0413 10/07/20 0438 10/08/20 0519  PROCALCITON 1.93  --   --  39.42 23.89 7.81  LATICACIDVEN  --  2.4* 1.1  --   --   --     Recent Results (from the past 240 hour(s))  Culture, blood (x 2)     Status: Abnormal   Collection Time: 10/02/20  6:24 PM   Specimen: BLOOD  Result Value Ref Range Status   Specimen Description   Final     BLOOD BLOOD RIGHT HAND Performed at [facility], [address], [place], [place] [address]    Special Requests   Final    BOTTLES DRAWN AEROBIC AND ANAEROBIC Blood Culture adequate volume Performed at [facility], [address] [place], [place] [address]    Culture  Setup Time   Final    GRAM NEGATIVE RODS IN BOTH AEROBIC AND ANAEROBIC BOTTLES CRITICAL RESULT CALLED TO, READ BACK BY AND VERIFIED WITH: NATHAN BELUE AT [place] 10/03/2020 [place]. Performed at [facility], [place] [address] [place],  [address]    Culture (A)  Final  KLEBSIELLA PNEUMONIAE Confirmed Extended Spectrum Beta-Lactamase Producer (ESBL).  In bloodstream infections from ESBL organisms, carbapenems are preferred over piperacillin/tazobactam. They are shown to have a lower risk of mortality.    Report Status 10/05/2020 FINAL  Final   Organism ID, Bacteria KLEBSIELLA PNEUMONIAE  Final      Susceptibility   Klebsiella pneumoniae - MIC*    AMPICILLIN >=32 RESISTANT Resistant     CEFAZOLIN >=64 RESISTANT Resistant     CEFEPIME 8 INTERMEDIATE Intermediate     CEFTAZIDIME >=64 RESISTANT Resistant     CEFTRIAXONE >=64 RESISTANT Resistant     CIPROFLOXACIN <=0.25 SENSITIVE Sensitive     GENTAMICIN <=1 SENSITIVE Sensitive     IMIPENEM <=0.25 SENSITIVE Sensitive     TRIMETH/SULFA <=20 SENSITIVE Sensitive     AMPICILLIN/SULBACTAM >=32 RESISTANT Resistant     PIP/TAZO >=128 RESISTANT Resistant     * KLEBSIELLA PNEUMONIAE  Blood Culture ID Panel (Reflexed)     Status: Abnormal   Collection Time: 10/02/20  6:24 PM  Result Value Ref Range Status   Enterococcus faecalis NOT DETECTED NOT DETECTED Final   Enterococcus Faecium NOT DETECTED NOT DETECTED Final   Listeria monocytogenes NOT DETECTED NOT DETECTED Final   Staphylococcus species NOT DETECTED NOT DETECTED Final   Staphylococcus aureus (BCID) NOT DETECTED NOT DETECTED Final   Staphylococcus epidermidis NOT DETECTED NOT DETECTED  Final   Staphylococcus lugdunensis NOT DETECTED NOT DETECTED Final   Streptococcus species NOT DETECTED NOT DETECTED Final   Streptococcus agalactiae NOT DETECTED NOT DETECTED Final   Streptococcus pneumoniae NOT DETECTED NOT DETECTED Final   Streptococcus pyogenes NOT DETECTED NOT DETECTED Final   A.calcoaceticus-baumannii NOT DETECTED NOT DETECTED Final   Bacteroides fragilis NOT DETECTED NOT DETECTED Final   Enterobacterales DETECTED (A) NOT DETECTED Final    Comment: Enterobacterales represent a large order of gram negative bacteria, not a single organism. CRITICAL RESULT CALLED TO, READ BACK BY AND VERIFIED WITH: NATHAN BELUE AT 0708 ON 10/03/2020 [place].    Enterobacter cloacae complex NOT DETECTED NOT DETECTED Final   Escherichia coli NOT DETECTED NOT DETECTED Final   Klebsiella aerogenes NOT DETECTED NOT DETECTED Final   Klebsiella oxytoca NOT DETECTED NOT DETECTED Final   Klebsiella pneumoniae DETECTED (A) NOT DETECTED Corrected    Comment: CORRECTED ON 05/22 AT 0825: PREVIOUSLY REPORTED AS DETECTED CRITICAL RESULT CALLED TO, READ BACK BY AND VERIFIED WITH: NATHAN BELUE AT 0708 ON 10/03/2020 [place].   Proteus species NOT DETECTED NOT DETECTED Final   Salmonella species NOT DETECTED NOT DETECTED Final   Serratia marcescens NOT DETECTED NOT DETECTED Final   Haemophilus influenzae NOT DETECTED NOT DETECTED Final   Neisseria meningitidis NOT DETECTED NOT DETECTED Final   Pseudomonas aeruginosa NOT DETECTED NOT DETECTED Final   Stenotrophomonas maltophilia NOT DETECTED NOT DETECTED Final   Candida albicans NOT DETECTED NOT DETECTED Final   Candida auris NOT DETECTED NOT DETECTED Final   Candida glabrata NOT DETECTED NOT DETECTED Final   Candida krusei NOT DETECTED NOT DETECTED Final   Candida parapsilosis NOT DETECTED NOT DETECTED Final   Candida tropicalis NOT DETECTED NOT DETECTED Final   Cryptococcus neoformans/gattii NOT DETECTED NOT DETECTED Final   CTX-M ESBL NOT DETECTED NOT  DETECTED Final   Carbapenem resistance IMP NOT DETECTED NOT DETECTED Final   Carbapenem resistance KPC NOT DETECTED NOT DETECTED Final   Carbapenem resistance NDM NOT DETECTED NOT DETECTED Final   Carbapenem resist OXA 48 LIKE NOT DETECTED NOT DETECTED Final   Carbapenem resistance VIM  NOT DETECTED NOT DETECTED Final    Comment: Performed at [facility], [address] [place], [place] [address]  Culture, blood (x 2)     Status: Abnormal   Collection Time: 10/02/20  6:32 PM   Specimen: BLOOD  Result Value Ref Range Status   Specimen Description   Final    BLOOD RIGHT ANTECUBITAL Performed at [facility], [address] [place], [place] [address]    Special Requests   Final    BOTTLES DRAWN AEROBIC AND ANAEROBIC Blood Culture results may not be optimal due to an excessive volume of blood received in culture bottles Performed at [facility], [address] [place], [place] [address]    Culture  Setup Time   Final    GRAM NEGATIVE RODS IN BOTH AEROBIC AND ANAEROBIC BOTTLES CRITICAL VALUE NOTED.  VALUE IS CONSISTENT WITH PREVIOUSLY REPORTED AND CALLED VALUE. Performed at [facility], [place] [address] [place], [place] [address]    Culture (A)  Final    KLEBSIELLA PNEUMONIAE SUSCEPTIBILITIES PERFORMED ON PREVIOUS CULTURE WITHIN THE LAST 5 DAYS. Performed at [facility], [place] [address], [place], [place] [address]    Report Status 10/05/2020 FINAL  Final  Culture, Urine     Status: None   Collection Time: 10/03/20  8:52 AM   Specimen: Urine, Clean Catch  Result Value Ref Range Status   Specimen Description   Final    URINE, CLEAN CATCH Performed at [facility], [address], [place], [place] [address]    Special Requests   Final    NONE Performed at [facility], [address] [place], [place] [address]    Culture   Final    NO GROWTH Performed at [facility], [place] [address] [place], [place]  [address]    Report Status 10/04/2020 FINAL  Final         Radiology Studies: No results found.      Scheduled Meds: . aspirin  81 mg Oral QHS  . buprenorphine-naloxone  2 tablet Sublingual Daily  . Chlorhexidine Gluconate Cloth  6 each Topical QHS  . clopidogrel  75 mg Oral QHS  . fluticasone  1 spray Each Nare Daily  . insulin aspart  0-15 Units Subcutaneous Q4H  . levothyroxine  88 mcg Oral Q0600  . loratadine  10 mg Oral Daily  . mouth rinse  15 mL Mouth Rinse BID  . metoprolol tartrate  25 mg Oral BID  . midodrine  10 mg Oral TID WC  . mometasone-formoterol  2 puff Inhalation BID  . multivitamin with minerals  1 tablet Oral Daily  . pantoprazole  40 mg Oral Daily  . rosuvastatin  40 mg Oral QHS  . sodium chloride flush  3 mL Intravenous Q12H  . sodium chloride flush  3 mL Intravenous Q12H   Continuous Infusions: . sodium chloride Stopped (09/24/20 1321)  . sodium chloride Stopped (10/06/20 1418)  . meropenem (MERREM) IV 1 g (10/08/20 1444)  . norepinephrine (LEVOPHED) Adult infusion Stopped (10/07/20 1259)     LOS: 28 days    Time spent: 35 minutes    Kathie Dike, MD [facility]  If 7PM-7AM, please contact night-coverage 10/08/2020, 3:34 PM

## 2020-10-08 NOTE — Plan of Care (Signed)
  Problem: Clinical Measurements: Goal: Respiratory complications will improve Outcome: Progressing   Problem: Nutrition: Goal: Adequate nutrition will be maintained Outcome: Progressing   

## 2020-10-08 NOTE — TOC Progression Note (Signed)
Transition of Care [facility]) - Progression Note    Patient Details  Name: Kathryn Garrett MRN: [id number] Date of Birth: [birth year]  Transition of Care [facility]) CM/SW [place], [place] Phone Number: [phone] 10/08/2020, 2:44 PM  Clinical Narrative:     CSW contacted [place] CSW left voicemail for admission coordinator requesting placement update [phone].  [facility], CSW left voicemail for placement update [phone].    Barriers to Discharge: Continued Medical Work up  Expected Discharge Plan and Services   In-house Referral: Clinical Social Work   Post Acute Care Choice:  [facility]) Living arrangements for the past 2 months: Single Family Home                                       Social Determinants of Health (SDOH) Interventions    Readmission Risk Interventions No flowsheet data found.

## 2020-10-09 DIAGNOSIS — J441 Chronic obstructive pulmonary disease with (acute) exacerbation: Secondary | ICD-10-CM | POA: Diagnosis not present

## 2020-10-09 DIAGNOSIS — I214 Non-ST elevation (NSTEMI) myocardial infarction: Secondary | ICD-10-CM | POA: Diagnosis not present

## 2020-10-09 DIAGNOSIS — I5041 Acute combined systolic (congestive) and diastolic (congestive) heart failure: Secondary | ICD-10-CM | POA: Diagnosis not present

## 2020-10-09 DIAGNOSIS — R0603 Acute respiratory distress: Secondary | ICD-10-CM | POA: Diagnosis not present

## 2020-10-09 LAB — GLUCOSE, CAPILLARY
Glucose-Capillary: 100 mg/dL — ABNORMAL HIGH (ref 70–99)
Glucose-Capillary: 112 mg/dL — ABNORMAL HIGH (ref 70–99)
Glucose-Capillary: 118 mg/dL — ABNORMAL HIGH (ref 70–99)
Glucose-Capillary: 121 mg/dL — ABNORMAL HIGH (ref 70–99)
Glucose-Capillary: 127 mg/dL — ABNORMAL HIGH (ref 70–99)
Glucose-Capillary: 128 mg/dL — ABNORMAL HIGH (ref 70–99)
Glucose-Capillary: 94 mg/dL (ref 70–99)

## 2020-10-09 LAB — CBC WITH DIFFERENTIAL/PLATELET
Abs Immature Granulocytes: 0.06 10*3/uL (ref 0.00–0.07)
Basophils Absolute: 0.1 10*3/uL (ref 0.0–0.1)
Basophils Relative: 1 %
Eosinophils Absolute: 0.2 10*3/uL (ref 0.0–0.5)
Eosinophils Relative: 3 %
HCT: 31.6 % — ABNORMAL LOW (ref 36.0–46.0)
Hemoglobin: 10 g/dL — ABNORMAL LOW (ref 12.0–15.0)
Immature Granulocytes: 1 %
Lymphocytes Relative: 38 %
Lymphs Abs: 3 10*3/uL (ref 0.7–4.0)
MCH: 30.1 pg (ref 26.0–34.0)
MCHC: 31.6 g/dL (ref 30.0–36.0)
MCV: 95.2 fL (ref 80.0–100.0)
Monocytes Absolute: 0.7 10*3/uL (ref 0.1–1.0)
Monocytes Relative: 8 %
Neutro Abs: 4.1 10*3/uL (ref 1.7–7.7)
Neutrophils Relative %: 49 %
Platelets: 298 10*3/uL (ref 150–400)
RBC: 3.32 MIL/uL — ABNORMAL LOW (ref 3.87–5.11)
RDW: 14.6 % (ref 11.5–15.5)
Smear Review: NORMAL
WBC: 7.9 10*3/uL (ref 4.0–10.5)
nRBC: 0 % (ref 0.0–0.2)

## 2020-10-09 LAB — BASIC METABOLIC PANEL
Anion gap: 8 (ref 5–15)
BUN: 9 mg/dL (ref 8–23)
CO2: 25 mmol/L (ref 22–32)
Calcium: 8.9 mg/dL (ref 8.9–10.3)
Chloride: 109 mmol/L (ref 98–111)
Creatinine, Ser: 0.44 mg/dL (ref 0.44–1.00)
GFR, Estimated: >60 mL/min (ref >60)
Glucose, Bld: 109 mg/dL — ABNORMAL HIGH (ref 70–99)
Potassium: 4.3 mmol/L (ref 3.5–5.1)
Sodium: 142 mmol/L (ref 135–145)

## 2020-10-09 LAB — MAGNESIUM: Magnesium: 2.3 mg/dL (ref 1.7–2.4)

## 2020-10-09 MED ORDER — MIDODRINE HCL 5 MG PO TABS
5.0000 mg | ORAL_TABLET | Freq: Three times a day (TID) | ORAL | Status: DC
  Administered 2020-10-09 – 2020-10-11 (×5): 5 mg via ORAL
  Filled 2020-10-09 (×5): qty 1

## 2020-10-09 MED ORDER — METOPROLOL SUCCINATE ER 50 MG PO TB24
50.0000 mg | ORAL_TABLET | Freq: Two times a day (BID) | ORAL | Status: DC
  Administered 2020-10-09 – 2020-10-13 (×8): 50 mg via ORAL
  Filled 2020-10-09 (×8): qty 1

## 2020-10-09 NOTE — Progress Notes (Signed)
Progress Note  Patient Name: Kathryn Garrett Date of Encounter: 10/09/2020  [facility] Cardiologist: Larae Grooms, MD   Subjective   Family at the bedside, sitting up eating lunch Off oxygen, no complaints Denies any significant chest pain, no cough, no tachypalpitations Telemetry reviewed, sinus tachycardia noted  Inpatient Medications    Scheduled Meds: . aspirin  81 mg Oral QHS  . buprenorphine-naloxone  2 tablet Sublingual Daily  . Chlorhexidine Gluconate Cloth  6 each Topical QHS  . clopidogrel  75 mg Oral QHS  . fluticasone  1 spray Each Nare Daily  . insulin aspart  0-15 Units Subcutaneous Q4H  . levothyroxine  88 mcg Oral Q0600  . loratadine  10 mg Oral Daily  . mouth rinse  15 mL Mouth Rinse BID  . metoprolol tartrate  25 mg Oral BID  . midodrine  10 mg Oral TID WC  . mometasone-formoterol  2 puff Inhalation BID  . multivitamin with minerals  1 tablet Oral Daily  . pantoprazole  40 mg Oral Daily  . rosuvastatin  40 mg Oral QHS  . sodium chloride flush  3 mL Intravenous Q12H  . sodium chloride flush  3 mL Intravenous Q12H   Continuous Infusions: . sodium chloride Stopped (09/24/20 1321)  . sodium chloride Stopped (10/06/20 1418)  . meropenem (MERREM) IV 1 g (10/09/20 0922)   PRN Meds: acetaminophen **OR** acetaminophen, albuterol, artificial tears, docusate, guaiFENesin, ondansetron (ZOFRAN) IV, polyethylene glycol   Vital Signs    Vitals:   10/09/20 0020 10/09/20 0453 10/09/20 0733 10/09/20 1147  BP: (!) 120/53 (!) 126/59 (!) 145/62 (!) 142/57  Pulse: 79 88 99 93  Resp: 18 18 19 19   Temp: 98.2 F (36.8 C) 98.2 F (36.8 C) 97.9 F (36.6 C) 98.2 F (36.8 C)  TempSrc:   Oral Oral  SpO2: 97% 95% 96% 95%  Weight:  60.9 kg    Height:        Intake/Output Summary (Last 24 hours) at 10/09/2020 1348 Last data filed at 10/09/2020 0500 Gross per 24 hour  Intake --  Output 1100 ml  Net -1100 ml   Last 3 Weights 10/09/2020 10/08/2020 10/07/2020   Weight (lbs) 134 lb 4.8 oz 119 lb 0.8 oz 128 lb 1.4 oz  Weight (kg) 60.918 kg 54 kg 58.1 kg      Telemetry    Sinus tachycardia rate 90 up to 100 bpm reviewed personally by myself  ECG     - Personally Reviewed  Physical Exam   Constitutional:  oriented to person, place, and time. No distress.  HENT:  Head: Grossly normal Eyes:  no discharge. No scleral icterus.  Neck: No JVD, no carotid bruits  Cardiovascular: Regular rate and rhythm, no murmurs appreciated Pulmonary/Chest: Scattered Rales Abdominal: Soft.  no distension.  no tenderness.  Musculoskeletal: Normal range of motion Neurological:  normal muscle tone. Coordination normal. No atrophy Skin: Skin warm and dry Psychiatric: normal affect, pleasant   Labs    High Sensitivity Troponin:   Recent Labs  Lab 09/10/20 2050 09/11/20 0437 09/11/20 1433 09/11/20 1611 09/11/20 2011  TROPONINIHS 1,838* 2,434* 2,265* 2,455* 2,317*      Chemistry Recent Labs  Lab 10/02/20 1736 10/03/20 6803 10/04/20 0407 10/07/20 0438 10/08/20 0519 10/09/20 0618  NA 134* 138   < > 139  140 140 142  K 3.9 3.5   < > 3.7  3.7 3.5 4.3  CL 99 105   < > 104  105 106  109  CO2 26 25   < > 26  27 26 25   GLUCOSE 142* 136*   < > 111*  111* 96 109*  BUN 12 10   < > 12  12 8 9   CREATININE 0.58 0.44   < > 0.54  0.50 0.47 0.44  CALCIUM 8.5* 8.4*   < > 8.3*  8.4* 8.3* 8.9  PROT 6.4*  --   --   --   --   --   ALBUMIN 2.8* 2.5*  --  2.3*  --   --   AST 27  --   --   --   --   --   ALT 28  --   --   --   --   --   ALKPHOS 77  --   --   --   --   --   BILITOT 0.6  --   --   --   --   --   GFRNONAA >60 >60   < > >60  >60 >60 >60  ANIONGAP 9 8   < > 9  8 8 8    < > = values in this interval not displayed.     Hematology Recent Labs  Lab 10/07/20 0438 10/08/20 0519 10/09/20 0618  WBC 8.6 6.9 7.9  RBC 3.19* 2.99* 3.32*  HGB 9.6* 9.1* 10.0*  HCT 29.8* 28.3* 31.6*  MCV 93.4 94.6 95.2  MCH 30.1 30.4 30.1  MCHC 32.2 32.2 31.6   RDW 14.5 14.6 14.6  PLT 258 249 298    BNPNo results for input(s): BNP, PROBNP in the last 168 hours.   DDimer  Recent Labs  Lab 10/02/20 1736  DDIMER 3.61*     Radiology    No results found.  Cardiac Studies     Patient Profile     75 y.o. female with CAD status post RCA PCI in 2017 and in-stent restenosis requiring PCI, VF arrest,, chronic systolic diastolic heart failure, COPD, ongoing tobacco abuse, hypertension, hyperlipidemia, COPD, and narcotic abuse admitted with COPD exacerbation and Klebsiella pneumonia as well as acute on chronic systolic and diastolic heart failure.  -- elevated troponin. Echo : EF 25 to 30% -- cardiogenic shock requiring milrinone for diuresis and hypoxic respiratory failure requiring intubation.  left and right heart cath on 5/13 ,  mild to moderate nonobstructive CAD and normal filling pressures.   SVT ,  managed with metoprolol.   Assessment & Plan    SVT/Sinus tachycardia -Paroxysmal, none recently Initially treated with amiodarone, since discontinued -Managed with metoprolol as blood pressure tolerates, held for some period of time during sepsis, reinitiated -Transition metoprolol to tartrate to metoprolol succinate given her cardiomyopathy, 50 twice daily started  Bacteremia/sepsis with hypotension on pressors Enterobacter and Klebsiella pneumonia on blood cultures,  mentating better On broad-spectrum antibiotics Sepsis picture slowly improving, blood pressure stabilizing, off pressors ----will decrease midodrine down to 5 mg 3 times daily given blood pressure has stabilized.  CAD s/p PCI Elevated troponin - cardiac cath showed nonobstructive diseaseabove - continue ASA, Plavix, statin Transition to metoprolol succinate 50 twice daily  Acute systolic and diastolic heart failure - LVEF reduced to 25-30% Catheterization has been completed, no intervention needed --Now off pressors,  Blood pressure stabilizing, will  decrease midodrine down to 5 3 times daily, transition metoprolol to tartrate 25 twice daily to metoprolol succinate 50 twice daily with close monitoring of blood pressure If blood pressure holds consider initiate ARB/Entresto At a  later date add Farxiga/Jardiance  Long discussion with patient and family at the bedside concerning her recovery, medication changes Very weak, needs to continue aggressive PT  Total encounter time more than 35 minutes  Greater than 50% was spent in counseling and coordination of care with the patient   For questions or updates, please contact [place] Please consult [email or web address] for contact info under        Signed, Ida Rogue, MD  10/09/2020, 1:48 PM

## 2020-10-09 NOTE — Progress Notes (Signed)
PROGRESS NOTE    Kathryn Garrett  ZOX:[id number] DOB: 1945-07-15 DOA: 09/10/2020 PCP: Imagene Riches, NP   Brief Narrative:  74/F presented to the [facility] ED from home with complaints of shortness of breath -Admitted on 4/29 with hypoxic respiratory failure secondary to COPD exacerbation and community-acquired pneumonia [facility] course was complicated by flash pulmonary edema and NSTEMI requiring mechanical ventilation, intubated on 4/30  Post extubation patient continued to slowly improve.  Initially was slated for CIR however unfortunately insurance denied.  Currently bed search initiated for skilled nursing facility.  Patient remains medically stable for discharge.  Received notification of case management that SNF had been denied by patient's insurance.  Appeal currently in progress.  5/22: Patient had acute onset of tachyarrhythmia yesterday.  Responded to bedside.  Ventricular rates approximately 60.  Unclear etiology as morphology.  Sinus.  Initially started amiodarone infusion however patient then dropped her blood pressure and became febrile.  Was transferred to the ICU overnight due to declining blood pressures despite aggressive fluid resuscitation.  Patient now in ICU.  Evidence of septic shock secondary to Klebsiella bacteremia.  On IV cefepime.  Mentating clearly.  Unclear source of sepsis.   5/23: Dynamics improving.  The exact etiology of the Klebsiella bacteremia is unclear.  Urinalysis did not grow any bacteria.  Case has been discussed with PCCM.  The Klebsiella pneumonia is the same bacteria that was found on tracheal aspirate when patient was intubated.  This raises possibility of another infectious focus somewhere in the body.  Considering TEE and will pursue MRI total spine to rule out spinal abscess.  5/24: Notified by pharmacy that Klebsiella isolated from the blood is demonstrating resistance gene.  Antibiotic switched to meropenem.  Before new antibiotic to be started  patient's clinical condition deteriorated.  She became acutely tachycardic, febrile, tachypneic.  PCCM reengaged for recommendations.  Assessment & Plan:   Principal Problem:   Acute exacerbation of chronic obstructive pulmonary disease (COPD) (HCC) Active Problems:   CAP (community acquired pneumonia)   CAD in native artery   HTN (hypertension)   Tobacco abuse   Hyperlipidemia LDL goal <70   Severe sepsis (HCC)   NSTEMI (non-ST elevated myocardial infarction) (HCC)   Hyperglycemia   SOB (shortness of breath)   Acute respiratory failure (HCC)   HFrEF (heart failure with reduced ejection fraction) (HCC)   Acute on chronic HFrEF (heart failure with reduced ejection fraction) (HCC)   SVT (supraventricular tachycardia) (HCC)   Acute combined systolic and diastolic heart failure (HCC)   Pure hypercholesterolemia  Septic shock Secondary to ESBL Klebsiella bacteremia Unclear source of bacteremia Urinalysis clean MRI total spine negative for abscess Was seemingly responding to IV ceftriaxone/cefepime Deteriorated on 5/24 Placed back on Levophed PCCM/ID following No indication for TEE at this point Plan: Levophed has been weaned off since 5/26 Continue meropenem with pharmacy dosing for total of 7 days No fevers and blood pressure stabiling Still has tachycardia with exertion   Acute hypoxic respiratory failure  Acute COPD Exacerbation/left lung multifocal pneumonia Flash pulmonary edema on 4/30 -Intubated for flash pulmonary edema on, extubated 5/7 -Treated with diuretics as well, now off -Improved and stable, weaned off O2 -Discharge planning, CIR declined -Now bed search initiated for skilled nursing facility -[facility] aware and is following for placement options -Patient has had a complicated hospital course and is significantly debilitated.  In my opinion she is unsafe to return home.  She does have recovery potential and would benefit from standard  short-term rehab.  Per case  management appeal process has been initiated. -Appeal has been upheld by [employer].  Will pursue skilled nursing facility at time of discharge.  Disposition plan pending  Sepsis, left lower lobe pneumonia Dysphagia -tracheal aspirate on 4/30 with KLEBSIELLA PNEUMONIAE (resistant to Ampicillin) -Completed 7 days of antibiotics. -Improving -SLP following, continues to have dysphagia, currently on dysphagia 3, honey thick liquids -Will need close SLP FU at rehab to monitor for continued improvement and changes to diet -Currently on honey thick liquids. Feels that po intake is improving  Acute systolic congestive heart failure/Flash pulmonary edema NSTEMI Paroxysmal SVT -Echo noted with drop in EF down to 25-30%  -Cardiology consulted, left heart cath noted nonobstructive coronary disease, recommended medical management and repeat echo in 2 to 3 months -Continueaspirin, Plavix. Metoprolol dose being adjusted as tolerated, Will consider entresto if BP continues to stabilize -Follow-up with Dr. Irish Lack in 2 to 3 weeks  Acute metabolic/toxic encephalopathy -ICU stay complicated by severe encephalopathy while on vent -At baseline on buprenorphine/naloxone which was held on admission, now resumed -Also had an MRI brain which was unremarkable -Improved and stable now, PT OT as tolerated   COPD Ongoing tobacco abuse -Counseled, tapered off prednisone, continue duo nebs   DVT prophylaxis: SQ Lovenox Code Status: Full Family Communication: no family at bedside today Disposition Plan: Status is: Inpatient  Remains inpatient appropriate because:Inpatient level of care appropriate due to severity of illness   Dispo: The patient is from: Home              Anticipated d/c is to: SNF              Patient currently is not medically stable to d/c.   Difficult to place patient No   Current sepsis/septic shock secondary to ESBL Klebsiella bacteremia.  Currently in stepdown status on  vasopressor support.  PCCM consulted.  Disposition plan pending.  Level ofcare: Progressive Cardiac  Consultants:   Palliative care  PCCM  Cardiology  ID  Procedures:   None  Antimicrobials:   Meropenem 5/24>   Subjective: Sitting up in bed. Feels well. No shortness of breath. Tolerating diet  Objective: Vitals:   10/09/20 0453 10/09/20 0733 10/09/20 1147 10/09/20 1645  BP: (!) 126/59 (!) 145/62 (!) 142/57 (!) 124/46  Pulse: 88 99 93 92  Resp: 18 19 19 19   Temp: 98.2 F (36.8 C) 97.9 F (36.6 C) 98.2 F (36.8 C) 98.4 F (36.9 C)  TempSrc:  Oral Oral Oral  SpO2: 95% 96% 95% 96%  Weight: 60.9 kg     Height:        Intake/Output Summary (Last 24 hours) at 10/09/2020 1726 Last data filed at 10/09/2020 0500 Gross per 24 hour  Intake --  Output 1100 ml  Net -1100 ml   Filed Weights   10/07/20 0500 10/08/20 0500 10/09/20 0453  Weight: 58.1 kg 54 kg 60.9 kg    Examination:  General exam: Alert, awake, oriented x 3 Respiratory system: Clear to auscultation. Respiratory effort normal. Cardiovascular system:RRR. No murmurs, rubs, gallops. Gastrointestinal system: Abdomen is nondistended, soft and nontender. No organomegaly or masses felt. Normal bowel sounds heard. Central nervous system: Alert and oriented. No focal neurological deficits. Extremities: No C/C/E, +pedal pulses Skin: No rashes, lesions or ulcers Psychiatry: Judgement and insight appear normal. Mood & affect appropriate.     Data Reviewed: I have personally reviewed following labs and imaging studies  CBC: Recent Labs  Lab 10/05/20 0325 10/06/20  6283 10/07/20 0438 10/08/20 0519 10/09/20 0618  WBC 5.6 11.2* 8.6 6.9 7.9  NEUTROABS 4.0 7.4 4.9 3.4 4.1  HGB 10.0* 10.6* 9.6* 9.1* 10.0*  HCT 30.7* 32.7* 29.8* 28.3* 31.6*  MCV 93.6 93.2 93.4 94.6 95.2  PLT 211 255 258 249 662   Basic Metabolic Panel: Recent Labs  Lab 10/03/20 0822 10/04/20 0407 10/05/20 0325 10/06/20 0413  10/07/20 0438 10/08/20 0519 10/09/20 0618  NA 138 141 134* 139 139  140 140 142  K 3.5 3.5 4.1 4.3 3.7  3.7 3.5 4.3  CL 105 108 101 101 104  105 106 109  CO2 25 26 26 29 26  27 26 25   GLUCOSE 136* 123* 101* 117* 111*  111* 96 109*  BUN 10 8 10 12 12  12 8 9   CREATININE 0.44 0.44 0.49 0.66 0.54  0.50 0.47 0.44  CALCIUM 8.4* 8.4* 8.2* 8.4* 8.3*  8.4* 8.3* 8.9  MG 1.7 1.8 2.1 2.3  --   --  2.3  PHOS 3.3 2.9 2.5 3.5 3.3  --   --    GFR: Estimated Creatinine Clearance: 48.2 mL/min (by C-G formula based on SCr of 0.44 mg/dL). Liver Function Tests: Recent Labs  Lab 10/02/20 1736 10/03/20 0822 10/07/20 0438  AST 27  --   --   ALT 28  --   --   ALKPHOS 77  --   --   BILITOT 0.6  --   --   PROT 6.4*  --   --   ALBUMIN 2.8* 2.5* 2.3*   No results for input(s): LIPASE, AMYLASE in the last 168 hours. No results for input(s): AMMONIA in the last 168 hours. Coagulation Profile: Recent Labs  Lab 10/02/20 1736  INR 1.4*   Cardiac Enzymes: No results for input(s): CKTOTAL, CKMB, CKMBINDEX, TROPONINI in the last 168 hours. BNP (last 3 results) No results for input(s): PROBNP in the last 8760 hours. HbA1C: No results for input(s): HGBA1C in the last 72 hours. CBG: Recent Labs  Lab 10/09/20 0019 10/09/20 0455 10/09/20 0735 10/09/20 1148 10/09/20 1647  GLUCAP 112* 94 100* 118* 128*   Lipid Profile: No results for input(s): CHOL, HDL, LDLCALC, TRIG, CHOLHDL, LDLDIRECT in the last 72 hours. Thyroid Function Tests: No results for input(s): TSH, T4TOTAL, FREET4, T3FREE, THYROIDAB in the last 72 hours. Anemia Panel: No results for input(s): VITAMINB12, FOLATE, FERRITIN, TIBC, IRON, RETICCTPCT in the last 72 hours. Sepsis Labs: Recent Labs  Lab 10/02/20 1736 10/02/20 1824 10/02/20 2130 10/06/20 0413 10/07/20 0438 10/08/20 0519  PROCALCITON 1.93  --   --  39.42 23.89 7.81  LATICACIDVEN  --  2.4* 1.1  --   --   --     Recent Results (from the past 240 hour(s))   Culture, blood (x 2)     Status: Abnormal   Collection Time: 10/02/20  6:24 PM   Specimen: BLOOD  Result Value Ref Range Status   Specimen Description   Final    BLOOD BLOOD RIGHT HAND Performed at [facility], [address] [place], [place] [address]    Special Requests   Final    BOTTLES DRAWN AEROBIC AND ANAEROBIC Blood Culture adequate volume Performed at [facility], [address], [place], [place] [address]    Culture  Setup Time   Final    GRAM NEGATIVE RODS IN BOTH AEROBIC AND ANAEROBIC BOTTLES CRITICAL RESULT CALLED TO, READ BACK BY AND VERIFIED WITH: NATHAN BELUE AT [place] ON 10/03/2020  [facility]. Performed at [facility], [place] [address], [place], [place] [address]    Culture (A)  Final    KLEBSIELLA PNEUMONIAE Confirmed Extended Spectrum Beta-Lactamase Producer (ESBL).  In bloodstream infections from ESBL organisms, carbapenems are preferred over piperacillin/tazobactam. They are shown to have a lower risk of mortality.    Report Status 10/05/2020 FINAL  Final   Organism ID, Bacteria KLEBSIELLA PNEUMONIAE  Final      Susceptibility   Klebsiella pneumoniae - MIC*    AMPICILLIN >=32 RESISTANT Resistant     CEFAZOLIN >=64 RESISTANT Resistant     CEFEPIME 8 INTERMEDIATE Intermediate     CEFTAZIDIME >=64 RESISTANT Resistant     CEFTRIAXONE >=64 RESISTANT Resistant     CIPROFLOXACIN <=0.25 SENSITIVE Sensitive     GENTAMICIN <=1 SENSITIVE Sensitive     IMIPENEM <=0.25 SENSITIVE Sensitive     TRIMETH/SULFA <=20 SENSITIVE Sensitive     AMPICILLIN/SULBACTAM >=32 RESISTANT Resistant     PIP/TAZO >=128 RESISTANT Resistant     * KLEBSIELLA PNEUMONIAE  Blood Culture ID Panel (Reflexed)     Status: Abnormal   Collection Time: 10/02/20  6:24 PM  Result Value Ref Range Status   Enterococcus faecalis NOT DETECTED NOT DETECTED Final   Enterococcus Faecium NOT DETECTED NOT DETECTED Final   Listeria monocytogenes NOT DETECTED NOT DETECTED Final    Staphylococcus species NOT DETECTED NOT DETECTED Final   Staphylococcus aureus (BCID) NOT DETECTED NOT DETECTED Final   Staphylococcus epidermidis NOT DETECTED NOT DETECTED Final   Staphylococcus lugdunensis NOT DETECTED NOT DETECTED Final   Streptococcus species NOT DETECTED NOT DETECTED Final   Streptococcus agalactiae NOT DETECTED NOT DETECTED Final   Streptococcus pneumoniae NOT DETECTED NOT DETECTED Final   Streptococcus pyogenes NOT DETECTED NOT DETECTED Final   A.calcoaceticus-baumannii NOT DETECTED NOT DETECTED Final   Bacteroides fragilis NOT DETECTED NOT DETECTED Final   Enterobacterales DETECTED (A) NOT DETECTED Final    Comment: Enterobacterales represent a large order of gram negative bacteria, not a single organism. CRITICAL RESULT CALLED TO, READ BACK BY AND VERIFIED WITH: NATHAN BELUE AT 0708 ON 10/03/2020 [place].    Enterobacter cloacae complex NOT DETECTED NOT DETECTED Final   Escherichia coli NOT DETECTED NOT DETECTED Final   Klebsiella aerogenes NOT DETECTED NOT DETECTED Final   Klebsiella oxytoca NOT DETECTED NOT DETECTED Final   Klebsiella pneumoniae DETECTED (A) NOT DETECTED Corrected    Comment: CORRECTED ON 05/22 AT 0825: PREVIOUSLY REPORTED AS DETECTED CRITICAL RESULT CALLED TO, READ BACK BY AND VERIFIED WITH: NATHAN BELUE AT 0708 ON 10/03/2020 [place].   Proteus species NOT DETECTED NOT DETECTED Final   Salmonella species NOT DETECTED NOT DETECTED Final   Serratia marcescens NOT DETECTED NOT DETECTED Final   Haemophilus influenzae NOT DETECTED NOT DETECTED Final   Neisseria meningitidis NOT DETECTED NOT DETECTED Final   Pseudomonas aeruginosa NOT DETECTED NOT DETECTED Final   Stenotrophomonas maltophilia NOT DETECTED NOT DETECTED Final   Candida albicans NOT DETECTED NOT DETECTED Final   Candida auris NOT DETECTED NOT DETECTED Final   Candida glabrata NOT DETECTED NOT DETECTED Final   Candida krusei NOT DETECTED NOT DETECTED Final   Candida parapsilosis NOT DETECTED  NOT DETECTED Final   Candida tropicalis NOT DETECTED NOT DETECTED Final   Cryptococcus neoformans/gattii NOT DETECTED NOT DETECTED Final   CTX-M ESBL NOT DETECTED NOT DETECTED Final   Carbapenem resistance IMP NOT DETECTED NOT DETECTED Final   Carbapenem resistance KPC NOT DETECTED NOT DETECTED Final   Carbapenem  resistance NDM NOT DETECTED NOT DETECTED Final   Carbapenem resist OXA 48 LIKE NOT DETECTED NOT DETECTED Final   Carbapenem resistance VIM NOT DETECTED NOT DETECTED Final    Comment: Performed at [facility], [address] [place], [place] [address]  Culture, blood (x 2)     Status: Abnormal   Collection Time: 10/02/20  6:32 PM   Specimen: BLOOD  Result Value Ref Range Status   Specimen Description   Final    BLOOD RIGHT ANTECUBITAL Performed at [facility], [address] [place], [place] [address]    Special Requests   Final    BOTTLES DRAWN AEROBIC AND ANAEROBIC Blood Culture results may not be optimal due to an excessive volume of blood received in culture bottles Performed at [facility], [address] [place], [place] [address]    Culture  Setup Time   Final    GRAM NEGATIVE RODS IN BOTH AEROBIC AND ANAEROBIC BOTTLES CRITICAL VALUE NOTED.  VALUE IS CONSISTENT WITH PREVIOUSLY REPORTED AND CALLED VALUE. Performed at [facility], [address] [place], [place] [address]    Culture (A)  Final    KLEBSIELLA PNEUMONIAE SUSCEPTIBILITIES PERFORMED ON PREVIOUS CULTURE WITHIN THE LAST 5 DAYS. Performed at [facility], [place] [address], [place], [place] [address]    Report Status 10/05/2020 FINAL  Final  Culture, Urine     Status: None   Collection Time: 10/03/20  8:52 AM   Specimen: Urine, Clean Catch  Result Value Ref Range Status   Specimen Description   Final    URINE, CLEAN CATCH Performed at [facility], [address], [place], [place] [address]    Special Requests   Final    NONE Performed at  [facility], [address] [place], [place] [address]    Culture   Final    NO GROWTH Performed at [facility], [place] [address] [place], [place] [address]    Report Status 10/04/2020 FINAL  Final         Radiology Studies: No results found.      Scheduled Meds: . aspirin  81 mg Oral QHS  . buprenorphine-naloxone  2 tablet Sublingual Daily  . Chlorhexidine Gluconate Cloth  6 each Topical QHS  . clopidogrel  75 mg Oral QHS  . fluticasone  1 spray Each Nare Daily  . insulin aspart  0-15 Units Subcutaneous Q4H  . levothyroxine  88 mcg Oral Q0600  . loratadine  10 mg Oral Daily  . mouth rinse  15 mL Mouth Rinse BID  . metoprolol succinate  50 mg Oral BID  . midodrine  5 mg Oral TID WC  . mometasone-formoterol  2 puff Inhalation BID  . multivitamin with minerals  1 tablet Oral Daily  . pantoprazole  40 mg Oral Daily  . rosuvastatin  40 mg Oral QHS  . sodium chloride flush  3 mL Intravenous Q12H  . sodium chloride flush  3 mL Intravenous Q12H   Continuous Infusions: . sodium chloride Stopped (09/24/20 1321)  . sodium chloride Stopped (10/06/20 1418)  . meropenem (MERREM) IV 1 g (10/09/20 1545)     LOS: 29 days    Time spent: 35 minutes    Kathie Dike, MD [facility]  If 7PM-7AM, please contact night-coverage 10/09/2020, 5:26 PM

## 2020-10-09 NOTE — Progress Notes (Signed)
NAME:  Kathryn Garrett, MRN:  [id number], DOB:  Dec 28, 1945, LOS: 25 ADMISSION DATE:  09/10/2020, INITIAL CONSULTATION DATE:  09/11/2020 REFERRING MD: Dr. Damita Garrett, CHIEF COMPLAINT: SOB  Brief Patient Description  [name] is a 8F with CAD status post RCA PCI in 2017 and in-stent restenosis requiring PCI, VF arrest,, chronic systolic diastolic heart failure, COPD, ongoing tobacco abuse, hypertension, hyperlipidemia, COPD, and narcotic abuse admitted with COPD exacerbation and Klebsiella pneumonia as well as acute on chronic systolic and diastolic heart failure.  Hospital course was complicated by cardiogenic shock requiring milrinone for diuresis and hypoxic respiratory failure requiring intubation. She was treated with Cefepime/Ancef for aspiration. She underwent left and right heart cath on 5/13 and was found to have mild to moderate nonobstructive CAD and normal filling pressures. She also had SVT that was managed with metoprolol. Patient was extubated and transferred to the floor. On 5/21 she was noted to be hypotensive despite IVFs and in sinus tachycardia to the 150s.  Lacate was 2.4.  Blood cultures with GNRs.  WBC was 14.8.  No significant infection in urine. CXR was unremarkable.  She was treated with Cefepime for Klebsiella pneumonia and transferred to ICU.  SHe was started on levophed for septic shock and PCCM re-consulted to assist with management.  Pertinent  Medical History  CAD status post RCA PCI in 2017 and in-stent restenosis requiring PCI,  VF arrest,,  chronic systolic diastolic heart failure,  COPD,  ongoing tobacco abuse,  hypertension,  hyperlipidemia,  Chronic narcotic abuse    Significant Hospital Events: Including procedures, antibiotic start and stop dates in addition to other pertinent events   09/10/2020-patient admits admitted with hospitalist service for COPD exacerbation and left lower lobe pneumonia 09/11/2020-overnight patient had suspected flash pulmonary  edema, dyspnea followed by somnolence requiring emergent intubation and mechanical ventilation.  Admitted to ICU 09/12/20- patient failed SBT , she had blood tinged secretions fromETT and aggitation, family was at bedside and we agreed on giving her more time and try again.  09/14/20- Failed SBT (became hypoxic with sats mid 80's, increased WOB and assessory muscle use, HR 140-160's). Will diurese today and add Metoprolol.  Tracheal aspirate from 4/30 with KLEBSIELLA PNEUMONIAE (resistant to Ampicillin)   09/15/20- Failed SBT (increased WOB and assessory muscle use, RR 40's, HR 140-150's, Hypertensive); plan to add scheduled PO Klonopin and Oxycodone, Lasix 40 mg IV x1, consult Palliative Care 09/16/20- Worsening Leukocytosis up to 21.7, low grade fever overnight, increased secretions overnight, will repeat Tracheal aspirate, remove central line, add mucinex, plan for SBT 09/17/20-[name] passed SBT in AM and following commands still having some moderate secretions 09/18/20-  Extubated yesterday.  Failed swallow eval today.  09/20/20- Weaned to room air, Leukocytosis improving; Hemodynamically stable 09/27/20- patient is coughing up phlegm but clinically is improved, we discussed pneumonia and COPD with outpatient clinic follow up with pulmonology.  There is plan for post dc rehab 09/28/20- patient is improved and she is being optimized for CIR, pulmonary will sign off and can follow up on outpatient basis 09/29/20- family at bedside today.  We discussed barium swallow with aspiration.  09/30/20- patient had PT today she walked today without need of additional supplemental O2 therapy.  She generally has been walking ok but has had severe LE weakness here.  She ate lunch infront of me had cough after couple bites.  We disucssed aspiartion risk and her barium swallow.  10/01/20: PCCM signed off  10/02/20: Pt with hypotension despite aggressive fluid resuscitation  concerning for sepsis secondary to UTI requiring levophed  gtt and transfer to stepdown unit.  PCCM signed back on case to assist with management  10/03/20: Remains on Levophed 10/04/20: Weaning down Levophed, increase Midodrine to 10 mg TID; consult ID for Bacteremia  10/05/20: Levophed weaned off, MRI spine yesterday negative for osteomyelitis discitis or septic arthritis, no epidural abscess or other fluid collection. Klebsiella is +ESBL.  ABX changed to Meropenem. 10/06/20: Deteriorated yesterday (fever, tachycardic, yesterday), Klebsiella ESBL +, ABX changed to Meropenem, requiring low dose Levophed again (currently on 5 mcg), ID following 10/06/20: Slowly weaning Levophed (currently at 3 mcg), WBC & Procalcitonin improving  10/07/20- patient is clinically improved, she is speaking and is oriented x3. Optimizing medically, vitals stabalizing, Procalcitonin trending down appropriately, Wbc count wnl, bmp is much improved, residual anemia on CBC.  PT/OT today 10/09/20- patient is seen at bedside, resp status improved. Son Kathryn Garrett at bedside states mother looks better.  She is ready for physical therapy.  She ate her entire meal this am which is a big improvement.  CBC and BMP normal except for mild chronic anemia. d    Cultures:  4/29: SARS-CoV-2 PCR>> negative 4/29:Influenza PCR>> negative 5/1: Strep pneumo urinary antigen>>negative 5/1: Legionella urinary antigen>>negative 5/5: Tracheal aspirate> candida albicans 5/21: Blood culture x2>>3 out 4 bottles growing GNR BCID positive for klebsiella pneumoniae (+ESBL) 5/21: UCx> negative  Antimicrobials:  Ceftriaxone 5/22>>5/24 Meropenem 5/24>> Cefepime 5/21 x 1 dose Metronidazole 5/21 x 1 dose   OBJECTIVE   Blood pressure (!) 124/46, pulse 92, temperature 98.4 F (36.9 C), temperature source Oral, resp. rate 19, height 4\' 11"  (1.499 m), weight 60.9 kg, SpO2 96 %.        Intake/Output Summary (Last 24 hours) at 10/09/2020 1651 Last data filed at 10/09/2020 0500 Gross per 24 hour  Intake --   Output 1100 ml  Net -1100 ml   Filed Weights   10/07/20 0500 10/08/20 0500 10/09/20 0453  Weight: 58.1 kg 54 kg 60.9 kg   Physical Examination: GENERAL:age appropriate 75 year-old patient, sitting in bed, no acute distress.  EYES: Pupils PERRLA. No scleral icterus. Extraocular muscles intact.  HEENT: Head atraumatic, normocephalic. Oropharynx and nasopharynx clear.  NECK:  Supple, no jugular venous distention. No thyroid enlargement, no tenderness.  LUNGS: Clear breath sounds bilaterally, no wheezing, rales,rhonchi or crepitation. No use of accessory muscles of respiration.  CARDIOVASCULAR: Tachycardia, regular rhythm, S1, S2. No murmurs, rubs, or gallops. 2+ distal pulses ABDOMEN: Soft, nontender, nondistended. Bowel sounds present. No organomegaly or mass.  EXTREMITIES: No pedal edema, cyanosis, or clubbing.  NEUROLOGIC: Cranial nerves II through XII are intact. Muscle strength 5/5 in all extremities. Sensation intact. Gait not checked.  PSYCHIATRIC: The patient is alert and oriented x 4.  SKIN: Warm and dry. No obvious rash, lesion, or ulcer.     Labs   CBC: Recent Labs  Lab 10/05/20 0325 10/06/20 0413 10/07/20 0438 10/08/20 0519 10/09/20 0618  WBC 5.6 11.2* 8.6 6.9 7.9  NEUTROABS 4.0 7.4 4.9 3.4 4.1  HGB 10.0* 10.6* 9.6* 9.1* 10.0*  HCT 30.7* 32.7* 29.8* 28.3* 31.6*  MCV 93.6 93.2 93.4 94.6 95.2  PLT 211 255 258 249 481    Basic Metabolic Panel: Recent Labs  Lab 10/03/20 0822 10/04/20 0407 10/05/20 0325 10/06/20 0413 10/07/20 0438 10/08/20 0519 10/09/20 0618  NA 138 141 134* 139 139  140 140 142  K 3.5 3.5 4.1 4.3 3.7  3.7 3.5 4.3  CL 105 108 101 101  104  105 106 109  CO2 25 26 26 29 26  27 26 25   GLUCOSE 136* 123* 101* 117* 111*  111* 96 109*  BUN 10 8 10 12 12  12 8 9   CREATININE 0.44 0.44 0.49 0.66 0.54  0.50 0.47 0.44  CALCIUM 8.4* 8.4* 8.2* 8.4* 8.3*  8.4* 8.3* 8.9  MG 1.7 1.8 2.1 2.3  --   --  2.3  PHOS 3.3 2.9 2.5 3.5 3.3  --   --     GFR: Estimated Creatinine Clearance: 48.2 mL/min (by C-G formula based on SCr of 0.44 mg/dL). Recent Labs  Lab 10/02/20 1736 10/02/20 1823 10/02/20 1824 10/02/20 2130 10/03/20 1308 10/06/20 0413 10/07/20 0438 10/08/20 0519 10/09/20 0618  PROCALCITON 1.93  --   --   --   --  39.42 23.89 7.81  --   WBC  --    < >  --   --    < > 11.2* 8.6 6.9 7.9  LATICACIDVEN  --   --  2.4* 1.1  --   --   --   --   --    < > = values in this interval not displayed.    Liver Function Tests: Recent Labs  Lab 10/02/20 1736 10/03/20 0822 10/07/20 0438  AST 27  --   --   ALT 28  --   --   ALKPHOS 77  --   --   BILITOT 0.6  --   --   PROT 6.4*  --   --   ALBUMIN 2.8* 2.5* 2.3*   No results for input(s): LIPASE, AMYLASE in the last 168 hours. No results for input(s): AMMONIA in the last 168 hours.  ABG    Component Value Date/Time   PHART 7.48 (H) 10/05/2020 1000   PCO2ART 28 (L) 10/05/2020 1000   PO2ART 66 (L) 10/05/2020 1000   HCO3 20.9 10/05/2020 1000   TCO2 23 01/14/2009 0327   ACIDBASEDEF 1.6 10/05/2020 1000   O2SAT 94.2 10/05/2020 1000     Coagulation Profile: Recent Labs  Lab 10/02/20 1736  INR 1.4*    Cardiac Enzymes: No results for input(s): CKTOTAL, CKMB, CKMBINDEX, TROPONINI in the last 168 hours.  HbA1C: Hgb A1c MFr Bld  Date/Time Value Ref Range Status  09/11/2020 04:37 AM 6.4 (H) 4.8 - 5.6 % Final    Comment:    (NOTE) Pre diabetes:          5.7%-6.4%  Diabetes:              >6.4%  Glycemic control for   <7.0% adults with diabetes   10/02/2019 12:28 PM 6.4 (H) 4.8 - 5.6 % Final    Comment:             Prediabetes: 5.7 - 6.4          Diabetes: >6.4          Glycemic control for adults with diabetes: <7.0     CBG: Recent Labs  Lab 10/09/20 0019 10/09/20 0455 10/09/20 0735 10/09/20 1148 10/09/20 1647  GLUCAP 112* 94 100* 118* 128*    Allergies Allergies  Allergen Reactions  . Azithromycin Rash     Home Medications  Prior to  Admission medications   Medication Sig Start Date End Date Taking? Authorizing Provider  acetaminophen (TYLENOL) 500 MG tablet Take 1,000 mg by mouth every 6 (six) hours as needed for headache.   Yes [provider]  albuterol (PROVENTIL HFA;VENTOLIN HFA)  108 (90 Base) MCG/ACT inhaler Inhale 2 puffs into the lungs every 4 (four) hours as needed for wheezing or shortness of breath.   Yes [provider]  amoxicillin (AMOXIL) 500 MG tablet Take 500 mg by mouth 3 (three) times daily. 08/03/20  Yes [provider]  aspirin EC 81 MG tablet Take 81 mg by mouth at bedtime.    Yes [provider]  azelastine (ASTELIN) 0.1 % nasal spray USE 2 SPRAYS BID UNTIL DIRECTED TO STOP. USE FOR RUNNY NOSE 08/14/17  Yes [provider]  Buprenorphine HCl-Naloxone HCl 8-2 MG FILM Place 0.5 Film under the tongue daily.   Yes [provider]  cetirizine (ZYRTEC) 10 MG tablet Take 1 tablet (10 mg total) by mouth daily. 03/11/18  Yes Alene Mires, Sahar M, PA-C  clopidogrel (PLAVIX) 75 MG tablet TAKE 1 TABLET(75 MG) BY MOUTH AT BEDTIME 09/16/20  Yes Jettie Booze, MD  dexamethasone (DECADRON) 6 MG tablet dexamethasone 6 mg tablet  TAKE 1 TABLET BY MOUTH EVERY DAY   Yes [provider]  doxycycline (VIBRAMYCIN) 100 MG capsule doxycycline hyclate 100 mg capsule  TAKE 1 CAPSULE BY MOUTH TWICE DAILY   Yes [provider]  fluticasone (FLONASE) 50 MCG/ACT nasal spray Place 1 spray into both nostrils daily.   Yes [provider]  furosemide (LASIX) 40 MG tablet Take 1 tablet (40 mg total) by mouth daily as needed (swelling). 01/16/20  Yes Jettie Booze, MD  ibuprofen (ADVIL,MOTRIN) 600 MG tablet Take 600 mg by mouth as needed for pain. 08/14/17  Yes [provider]  isosorbide mononitrate (IMDUR) 30 MG 24 hr tablet TAKE 1 TABLET(30 MG) BY MOUTH DAILY 09/16/20  Yes Jettie Booze, MD  levothyroxine (SYNTHROID) 88 MCG tablet Take 88 mcg by  mouth every morning. 05/26/20  Yes [provider]  metoprolol tartrate (LOPRESSOR) 25 MG tablet TAKE 1 TABLET BY MOUTH TWICE DAILY 09/16/20  Yes Jettie Booze, MD  mupirocin ointment (BACTROBAN) 2 % mupirocin 2 % topical ointment  APPLY TOPICALLY TO LOWER LEG THREE TIMES DAILY FOR 10 DAYS   Yes [provider]  nystatin cream (MYCOSTATIN) Apply topically 2 (two) times daily. 08/11/20  Yes [provider]  potassium chloride SA (KLOR-CON) 20 MEQ tablet Take 1 tablet (20 mEq total) by mouth daily as needed (Take with furosemide (lasix)). 01/16/20  Yes Jettie Booze, MD  predniSONE (DELTASONE) 20 MG tablet prednisone 20 mg tablet   Yes [provider]  rosuvastatin (CRESTOR) 20 MG tablet TAKE 1 TABLET(20 MG) BY MOUTH DAILY 09/16/20  Yes Jettie Booze, MD  TRELEGY ELLIPTA 100-62.5-25 MCG/INH AEPB Take 1 puff by mouth daily. 08/13/20  Yes [provider]  triamcinolone cream (KENALOG) 0.1 % Apply topically 2 (two) times daily. to affected area 08/11/20  Yes [provider]  metoprolol succinate (TOPROL-XL) 50 MG 24 hr tablet Take 1 tablet (50 mg total) by mouth daily. Take with or immediately following a meal. 09/29/20   Domenic Polite, MD  nitroGLYCERIN (NITROSTAT) 0.4 MG SL tablet Place 1 tablet (0.4 mg total) under the tongue every 5 (five) minutes as needed for chest pain. 03/12/18   Jettie Booze, MD  pantoprazole (PROTONIX) 40 MG tablet Take 1 tablet (40 mg total) by mouth daily. 09/29/20   Domenic Polite, MD  sacubitril-valsartan (ENTRESTO) 24-26 MG Take 1 tablet by mouth 2 (two) times daily. 09/28/20   Domenic Polite, MD   Scheduled Meds: . aspirin  81 mg  Oral QHS  . buprenorphine-naloxone  2 tablet Sublingual Daily  . Chlorhexidine Gluconate Cloth  6 each Topical QHS  . clopidogrel  75 mg Oral QHS  . fluticasone  1 spray Each Nare Daily  . insulin aspart  0-15 Units Subcutaneous Q4H  . levothyroxine  88 mcg Oral Q0600   . loratadine  10 mg Oral Daily  . mouth rinse  15 mL Mouth Rinse BID  . metoprolol succinate  50 mg Oral BID  . midodrine  5 mg Oral TID WC  . mometasone-formoterol  2 puff Inhalation BID  . multivitamin with minerals  1 tablet Oral Daily  . pantoprazole  40 mg Oral Daily  . rosuvastatin  40 mg Oral QHS  . sodium chloride flush  3 mL Intravenous Q12H  . sodium chloride flush  3 mL Intravenous Q12H   Continuous Infusions: . sodium chloride Stopped (09/24/20 1321)  . sodium chloride Stopped (10/06/20 1418)  . meropenem (MERREM) IV 1 g (10/09/20 1545)   PRN Meds:.acetaminophen **OR** acetaminophen, albuterol, artificial tears, docusate, guaiFENesin, ondansetron (ZOFRAN) IV, polyethylene glycol  Resolved Hospital Problem list     ASSESSMENT & PLAN   Septic shock     Due to Klebsiella pneumonia    - present on admission  Previous Klebsiella Pneumonia ~ treated (Completed 7 days of Cefepime/Ancef) -Monitor fever curve -Trend WBC's & Procalcitonin -Follow cultures as above -Blood cultures on 10/02/20 with klebsiella pneumoniae & Enterobacteriaceae  -ABX changed to Meropenem 10/05/20 -Urinalysis and urine culture on 5/21 negative (? Could be impacted by previous exposure to antibiotics) -CXR on 5/24 without new/worsening opacities, shows unchanged faint bibasilar opacities consistent with resolving pneumonia -MRI spine 10/04/20 negative for osteomyelitis discitis or septic arthritis within the cervical, thoracic, or lumbar spine. No epidural abscess or other collection. -ID following, appreciate input ~ will follow recommendations ~ recommends 7 days of Meropenem  Acute decompensated systolic congestive heart failure with EF 25%   - exacerbated by Klebsiella infection with pneumonia and sepsis   - cardiology on case - appreciate input-Dr Gollan                                   LVEF reduced to 25-30% Catheterization has been completed, no intervention needed -Metoprolol as blood  pressure tolerates above Other cardiomyopathy medications on hold for low blood pressure including Entresto  -Once recovered from sepsis could consider adding Farxiga/Jardiance    -   Sinus tachycardia    Cardiology on case - appreciate input  -Continuous cardiac monitoring -Maintain MAP >90    Acute metabolic/toxic encephalopathy ~ resolved  -Continue outpatient Suboxone due to narcotic abuse hx        Best practice (right click and "Reselect all SmartList Selections" daily)   Diet: DYS 3 Pain/Anxiety/Delirium protocol: not indicated  VAP protocol: not indicated  DVT prophylaxis: SCD and subq lovenox  GI prophylaxis: PPI Glucose control:  SSI Yes Central venous access: n/a Arterial line:  N/A Foley: N/A Mobility: OOB  PT consulted: yes Code Status:  full code Disposition: Stepdown     Critical care provider statement:    Critical care time (minutes):  109   Critical care time was exclusive of:  Separately billable procedures and  treating other patients   Critical care was necessary to treat or prevent imminent or  life-threatening deterioration of the following conditions:  septic shock, klesillla pneumonia, CHF with acute exacerbation, encephalopathy,  multiple comorbid conditions.   Critical care was time spent personally by me on the following  activities:  Development of treatment plan with patient or surrogate,  discussions with consultants, evaluation of patient's response to  treatment, examination of patient, obtaining history from patient or  surrogate, ordering and performing treatments and interventions, ordering  and review of laboratory studies and re-evaluation of patient's condition   I assumed direction of critical care for this patient from another  provider in my specialty: no        Ottie Glazier, M.D.  Pulmonary & [place]

## 2020-10-10 DIAGNOSIS — R0603 Acute respiratory distress: Secondary | ICD-10-CM | POA: Diagnosis not present

## 2020-10-10 DIAGNOSIS — I5023 Acute on chronic systolic (congestive) heart failure: Secondary | ICD-10-CM | POA: Diagnosis not present

## 2020-10-10 DIAGNOSIS — I2510 Atherosclerotic heart disease of native coronary artery without angina pectoris: Secondary | ICD-10-CM | POA: Diagnosis not present

## 2020-10-10 DIAGNOSIS — J9601 Acute respiratory failure with hypoxia: Secondary | ICD-10-CM | POA: Diagnosis not present

## 2020-10-10 LAB — CBC WITH DIFFERENTIAL/PLATELET
Abs Immature Granulocytes: 0.11 10*3/uL — ABNORMAL HIGH (ref 0.00–0.07)
Basophils Absolute: 0 10*3/uL (ref 0.0–0.1)
Basophils Relative: 0 %
Eosinophils Absolute: 0.2 10*3/uL (ref 0.0–0.5)
Eosinophils Relative: 2 %
HCT: 33.9 % — ABNORMAL LOW (ref 36.0–46.0)
Hemoglobin: 10.7 g/dL — ABNORMAL LOW (ref 12.0–15.0)
Immature Granulocytes: 1 %
Lymphocytes Relative: 31 %
Lymphs Abs: 2.9 10*3/uL (ref 0.7–4.0)
MCH: 30.2 pg (ref 26.0–34.0)
MCHC: 31.6 g/dL (ref 30.0–36.0)
MCV: 95.8 fL (ref 80.0–100.0)
Monocytes Absolute: 0.8 10*3/uL (ref 0.1–1.0)
Monocytes Relative: 9 %
Neutro Abs: 5.3 10*3/uL (ref 1.7–7.7)
Neutrophils Relative %: 57 %
Platelets: 364 10*3/uL (ref 150–400)
RBC: 3.54 MIL/uL — ABNORMAL LOW (ref 3.87–5.11)
RDW: 14.7 % (ref 11.5–15.5)
WBC: 9.3 10*3/uL (ref 4.0–10.5)
nRBC: 0 % (ref 0.0–0.2)

## 2020-10-10 LAB — GLUCOSE, CAPILLARY
Glucose-Capillary: 136 mg/dL — ABNORMAL HIGH (ref 70–99)
Glucose-Capillary: 120 mg/dL — ABNORMAL HIGH (ref 70–99)
Glucose-Capillary: 177 mg/dL — ABNORMAL HIGH (ref 70–99)
Glucose-Capillary: 120 mg/dL — ABNORMAL HIGH (ref 70–99)
Glucose-Capillary: 124 mg/dL — ABNORMAL HIGH (ref 70–99)

## 2020-10-10 NOTE — Progress Notes (Signed)
PROGRESS NOTE    Kathryn Garrett  KDT:[id number] DOB: [birth year] DOA: 09/10/2020 PCP: Imagene Riches, NP   Brief Narrative:  74/F presented to the [facility] ED from home with complaints of shortness of breath -Admitted on 4/29 with hypoxic respiratory failure secondary to COPD exacerbation and community-acquired pneumonia [facility] course was complicated by flash pulmonary edema and NSTEMI requiring mechanical ventilation, intubated on 4/30  Post extubation patient continued to slowly improve.  Initially was slated for CIR however unfortunately insurance denied.  Currently bed search initiated for skilled nursing facility.  Patient remains medically stable for discharge.  Received notification of case management that SNF had been denied by patient's insurance.  Appeal currently in progress.  5/22: Patient had acute onset of tachyarrhythmia yesterday.  Responded to bedside.  Ventricular rates approximately 60.  Unclear etiology as morphology.  Sinus.  Initially started amiodarone infusion however patient then dropped her blood pressure and became febrile.  Was transferred to the ICU overnight due to declining blood pressures despite aggressive fluid resuscitation.  Patient now in ICU.  Evidence of septic shock secondary to Klebsiella bacteremia.  On IV cefepime.  Mentating clearly.  Unclear source of sepsis.   5/23: Dynamics improving.  The exact etiology of the Klebsiella bacteremia is unclear.  Urinalysis did not grow any bacteria.  Case has been discussed with PCCM.  The Klebsiella pneumonia is the same bacteria that was found on tracheal aspirate when patient was intubated.  This raises possibility of another infectious focus somewhere in the body.  Considering TEE and will pursue MRI total spine to rule out spinal abscess.  5/24: Notified by pharmacy that Klebsiella isolated from the blood is demonstrating resistance gene.  Antibiotic switched to meropenem.  Before new antibiotic to be started  patient's clinical condition deteriorated.  She became acutely tachycardic, febrile, tachypneic.  PCCM reengaged for recommendations.  Assessment & Plan:   Principal Problem:   Acute exacerbation of chronic obstructive pulmonary disease (COPD) (HCC) Active Problems:   CAP (community acquired pneumonia)   CAD in native artery   HTN (hypertension)   Tobacco abuse   Hyperlipidemia LDL goal <70   Severe sepsis (HCC)   NSTEMI (non-ST elevated myocardial infarction) (HCC)   Hyperglycemia   SOB (shortness of breath)   Acute respiratory failure (HCC)   HFrEF (heart failure with reduced ejection fraction) (HCC)   Acute on chronic HFrEF (heart failure with reduced ejection fraction) (HCC)   SVT (supraventricular tachycardia) (HCC)   Acute combined systolic and diastolic heart failure (HCC)   Pure hypercholesterolemia  Septic shock Secondary to ESBL Klebsiella bacteremia Unclear source of bacteremia Urinalysis clean MRI total spine negative for abscess Was seemingly responding to IV ceftriaxone/cefepime Deteriorated on 5/24 Placed back on Levophed PCCM/ID following No indication for TEE at this point Plan: Levophed has been weaned off since 5/26 Weaning off midodrine Continue meropenem with pharmacy dosing for total of 7 days No fevers and blood pressure stabiling Still has tachycardia with exertion   Acute hypoxic respiratory failure  Acute COPD Exacerbation/left lung multifocal pneumonia Flash pulmonary edema on 4/30 -Intubated for flash pulmonary edema, extubated 5/7 -Treated with diuretics as well, now off -Improved and stable, weaned off O2 -Discharge planning, CIR declined -Now bed search initiated for skilled nursing facility -[facility] aware and is following for placement options -Patient has had a complicated hospital course and is significantly debilitated. She does have recovery potential and would benefit from standard short-term rehab.  Per case management appeal process  has been initiated. -Appeal has been upheld by [employer].  Will pursue skilled nursing facility at time of discharge.  Disposition plan pending  Sepsis, left lower lobe pneumonia Dysphagia -tracheal aspirate on 4/30 with KLEBSIELLA PNEUMONIAE (resistant to Ampicillin) -Completed 7 days of antibiotics. -Improving -SLP following, continues to have dysphagia, currently on dysphagia 3, honey thick liquids -Will need close SLP FU at rehab to monitor for continued improvement and changes to diet -Currently on honey thick liquids. Feels that po intake is improving -will ask SLP to readdress swallow  Acute systolic congestive heart failure/Flash pulmonary edema NSTEMI Paroxysmal SVT -Echo noted with drop in EF down to 25-30%  -Cardiology consulted, left heart cath noted nonobstructive coronary disease, recommended medical management and repeat echo in 2 to 3 months -Continueaspirin, Plavix. Metoprolol dose being adjusted as tolerated, Will consider entresto if BP continues to stabilize -Follow-up with Dr. Irish Lack in 2 to 3 weeks  Acute metabolic/toxic encephalopathy -ICU stay complicated by severe encephalopathy while on vent -At baseline on buprenorphine/naloxone which was held on admission, now resumed -Also had an MRI brain which was unremarkable -Improved and stable now, PT OT as tolerated   COPD Ongoing tobacco abuse -Counseled, tapered off prednisone, continue duo nebs   DVT prophylaxis: SQ Lovenox Code Status: Full Family Communication: discussed with daughter at the bedside Disposition Plan: Status is: Inpatient  Remains inpatient appropriate because:Inpatient level of care appropriate due to severity of illness   Dispo: The patient is from: Home              Anticipated d/c is to: SNF              Patient currently is not medically stable to d/c.   Difficult to place patient No   Level ofcare: Progressive Cardiac  Consultants:   Palliative  care  PCCM  Cardiology  ID  Procedures:   None  Antimicrobials:   Meropenem 5/24>   Subjective: Sitting up in chair. Worked with physical therapy today. Feels well  Objective: Vitals:   10/10/20 0407 10/10/20 0500 10/10/20 0800 10/10/20 1152  BP: (!) 144/65  133/64 (!) 112/56  Pulse: 92  89 97  Resp: 14  18 18   Temp: 98.4 F (36.9 C)  98.3 F (36.8 C) 97.9 F (36.6 C)  TempSrc: Oral   Oral  SpO2: 91%  91% 95%  Weight:  57.1 kg    Height:        Intake/Output Summary (Last 24 hours) at 10/10/2020 1545 Last data filed at 10/10/2020 0800 Gross per 24 hour  Intake --  Output 1800 ml  Net -1800 ml   Filed Weights   10/08/20 0500 10/09/20 0453 10/10/20 0500  Weight: 54 kg 60.9 kg 57.1 kg    Examination:  General exam: Alert, awake, oriented x 3 Respiratory system: Clear to auscultation. Respiratory effort normal. Cardiovascular system:RRR. No murmurs, rubs, gallops. Gastrointestinal system: Abdomen is nondistended, soft and nontender. No organomegaly or masses felt. Normal bowel sounds heard. Central nervous system: Alert and oriented. No focal neurological deficits. Extremities: No C/C/E, +pedal pulses Skin: No rashes, lesions or ulcers Psychiatry: Judgement and insight appear normal. Mood & affect appropriate.      Data Reviewed: I have personally reviewed following labs and imaging studies  CBC: Recent Labs  Lab 10/06/20 0413 10/07/20 0438 10/08/20 0519 10/09/20 0618 10/10/20 0628  WBC 11.2* 8.6 6.9 7.9 9.3  NEUTROABS 7.4 4.9 3.4 4.1 5.3  HGB 10.6* 9.6* 9.1* 10.0* 10.7*  HCT 32.7*  29.8* 28.3* 31.6* 33.9*  MCV 93.2 93.4 94.6 95.2 95.8  PLT 255 258 249 298 244   Basic Metabolic Panel: Recent Labs  Lab 10/04/20 0407 10/05/20 0325 10/06/20 0413 10/07/20 0438 10/08/20 0519 10/09/20 0618  NA 141 134* 139 139  140 140 142  K 3.5 4.1 4.3 3.7  3.7 3.5 4.3  CL 108 101 101 104  105 106 109  CO2 26 26 29 26  27 26 25   GLUCOSE 123* 101*  117* 111*  111* 96 109*  BUN 8 10 12 12  12 8 9   CREATININE 0.44 0.49 0.66 0.54  0.50 0.47 0.44  CALCIUM 8.4* 8.2* 8.4* 8.3*  8.4* 8.3* 8.9  MG 1.8 2.1 2.3  --   --  2.3  PHOS 2.9 2.5 3.5 3.3  --   --    GFR: Estimated Creatinine Clearance: 46.8 mL/min (by C-G formula based on SCr of 0.44 mg/dL). Liver Function Tests: Recent Labs  Lab 10/07/20 0438  ALBUMIN 2.3*   No results for input(s): LIPASE, AMYLASE in the last 168 hours. No results for input(s): AMMONIA in the last 168 hours. Coagulation Profile: No results for input(s): INR, PROTIME in the last 168 hours. Cardiac Enzymes: No results for input(s): CKTOTAL, CKMB, CKMBINDEX, TROPONINI in the last 168 hours. BNP (last 3 results) No results for input(s): PROBNP in the last 8760 hours. HbA1C: No results for input(s): HGBA1C in the last 72 hours. CBG: Recent Labs  Lab 10/09/20 2018 10/09/20 2339 10/10/20 0404 10/10/20 0758 10/10/20 1152  GLUCAP 121* 127* 136* 120* 177*   Lipid Profile: No results for input(s): CHOL, HDL, LDLCALC, TRIG, CHOLHDL, LDLDIRECT in the last 72 hours. Thyroid Function Tests: No results for input(s): TSH, T4TOTAL, FREET4, T3FREE, THYROIDAB in the last 72 hours. Anemia Panel: No results for input(s): VITAMINB12, FOLATE, FERRITIN, TIBC, IRON, RETICCTPCT in the last 72 hours. Sepsis Labs: Recent Labs  Lab 10/06/20 0413 10/07/20 0438 10/08/20 0519  PROCALCITON 39.42 23.89 7.81    Recent Results (from the past 240 hour(s))  Culture, blood (x 2)     Status: Abnormal   Collection Time: 10/02/20  6:24 PM   Specimen: BLOOD  Result Value Ref Range Status   Specimen Description   Final    BLOOD BLOOD RIGHT HAND Performed at [facility], [address], [place], [place] [address]    Special Requests   Final    BOTTLES DRAWN AEROBIC AND ANAEROBIC Blood Culture adequate volume Performed at [facility], [address], [place], [place] [address]    Culture  Setup Time    Final    GRAM NEGATIVE RODS IN BOTH AEROBIC AND ANAEROBIC BOTTLES CRITICAL RESULT CALLED TO, READ BACK BY AND VERIFIED WITH: NATHAN BELUE AT [place] 10/03/2020 [place]. Performed at [facility], [place] [address] [place], [place] [address]    Culture (A)  Final    KLEBSIELLA PNEUMONIAE Confirmed Extended Spectrum Beta-Lactamase Producer (ESBL).  In bloodstream infections from ESBL organisms, carbapenems are preferred over piperacillin/tazobactam. They are shown to have a lower risk of mortality.    Report Status 10/05/2020 FINAL  Final   Organism ID, Bacteria KLEBSIELLA PNEUMONIAE  Final      Susceptibility   Klebsiella pneumoniae - MIC*    AMPICILLIN >=32 RESISTANT Resistant     CEFAZOLIN >=64 RESISTANT Resistant     CEFEPIME 8 INTERMEDIATE Intermediate     CEFTAZIDIME >=64 RESISTANT Resistant     CEFTRIAXONE >=64 RESISTANT Resistant  CIPROFLOXACIN <=0.25 SENSITIVE Sensitive     GENTAMICIN <=1 SENSITIVE Sensitive     IMIPENEM <=0.25 SENSITIVE Sensitive     TRIMETH/SULFA <=20 SENSITIVE Sensitive     AMPICILLIN/SULBACTAM >=32 RESISTANT Resistant     PIP/TAZO >=128 RESISTANT Resistant     * KLEBSIELLA PNEUMONIAE  Blood Culture ID Panel (Reflexed)     Status: Abnormal   Collection Time: 10/02/20  6:24 PM  Result Value Ref Range Status   Enterococcus faecalis NOT DETECTED NOT DETECTED Final   Enterococcus Faecium NOT DETECTED NOT DETECTED Final   Listeria monocytogenes NOT DETECTED NOT DETECTED Final   Staphylococcus species NOT DETECTED NOT DETECTED Final   Staphylococcus aureus (BCID) NOT DETECTED NOT DETECTED Final   Staphylococcus epidermidis NOT DETECTED NOT DETECTED Final   Staphylococcus lugdunensis NOT DETECTED NOT DETECTED Final   Streptococcus species NOT DETECTED NOT DETECTED Final   Streptococcus agalactiae NOT DETECTED NOT DETECTED Final   Streptococcus pneumoniae NOT DETECTED NOT DETECTED Final   Streptococcus pyogenes NOT DETECTED NOT DETECTED Final    A.calcoaceticus-baumannii NOT DETECTED NOT DETECTED Final   Bacteroides fragilis NOT DETECTED NOT DETECTED Final   Enterobacterales DETECTED (A) NOT DETECTED Final    Comment: Enterobacterales represent a large order of gram negative bacteria, not a single organism. CRITICAL RESULT CALLED TO, READ BACK BY AND VERIFIED WITH: NATHAN BELUE AT 0708 ON 10/03/2020 [place].    Enterobacter cloacae complex NOT DETECTED NOT DETECTED Final   Escherichia coli NOT DETECTED NOT DETECTED Final   Klebsiella aerogenes NOT DETECTED NOT DETECTED Final   Klebsiella oxytoca NOT DETECTED NOT DETECTED Final   Klebsiella pneumoniae DETECTED (A) NOT DETECTED Corrected    Comment: CORRECTED ON 05/22 AT 0825: PREVIOUSLY REPORTED AS DETECTED CRITICAL RESULT CALLED TO, READ BACK BY AND VERIFIED WITH: NATHAN BELUE AT 0708 ON 10/03/2020 [place].   Proteus species NOT DETECTED NOT DETECTED Final   Salmonella species NOT DETECTED NOT DETECTED Final   Serratia marcescens NOT DETECTED NOT DETECTED Final   Haemophilus influenzae NOT DETECTED NOT DETECTED Final   Neisseria meningitidis NOT DETECTED NOT DETECTED Final   Pseudomonas aeruginosa NOT DETECTED NOT DETECTED Final   Stenotrophomonas maltophilia NOT DETECTED NOT DETECTED Final   Candida albicans NOT DETECTED NOT DETECTED Final   Candida auris NOT DETECTED NOT DETECTED Final   Candida glabrata NOT DETECTED NOT DETECTED Final   Candida krusei NOT DETECTED NOT DETECTED Final   Candida parapsilosis NOT DETECTED NOT DETECTED Final   Candida tropicalis NOT DETECTED NOT DETECTED Final   Cryptococcus neoformans/gattii NOT DETECTED NOT DETECTED Final   CTX-M ESBL NOT DETECTED NOT DETECTED Final   Carbapenem resistance IMP NOT DETECTED NOT DETECTED Final   Carbapenem resistance KPC NOT DETECTED NOT DETECTED Final   Carbapenem resistance NDM NOT DETECTED NOT DETECTED Final   Carbapenem resist OXA 48 LIKE NOT DETECTED NOT DETECTED Final   Carbapenem resistance VIM NOT DETECTED NOT  DETECTED Final    Comment: Performed at [facility], [address] [place], [place] [address]  Culture, blood (x 2)     Status: Abnormal   Collection Time: 10/02/20  6:32 PM   Specimen: BLOOD  Result Value Ref Range Status   Specimen Description   Final    BLOOD RIGHT ANTECUBITAL Performed at [facility], [address] [place], [place] [address]    Special Requests   Final    BOTTLES DRAWN AEROBIC AND ANAEROBIC Blood Culture results may not be optimal due to an excessive volume  of blood received in culture bottles Performed at [facility], [address] [place], [place] [address]    Culture  Setup Time   Final    GRAM NEGATIVE RODS IN BOTH AEROBIC AND ANAEROBIC BOTTLES CRITICAL VALUE NOTED.  VALUE IS CONSISTENT WITH PREVIOUSLY REPORTED AND CALLED VALUE. Performed at [facility], [address] [place], [place] [address]    Culture (A)  Final    KLEBSIELLA PNEUMONIAE SUSCEPTIBILITIES PERFORMED ON PREVIOUS CULTURE WITHIN THE LAST 5 DAYS. Performed at [facility], [place] [address], [place], [place] [address]    Report Status 10/05/2020 FINAL  Final  Culture, Urine     Status: None   Collection Time: 10/03/20  8:52 AM   Specimen: Urine, Clean Catch  Result Value Ref Range Status   Specimen Description   Final    URINE, CLEAN CATCH Performed at [facility], [address], [place], [place] [address]    Special Requests   Final    NONE Performed at [facility] [address], [place], [place] [address]    Culture   Final    NO GROWTH Performed at [facility], [place] [address] [place], [place] [address]    Report Status 10/04/2020 FINAL  Final         Radiology Studies: No results found.      Scheduled Meds: . aspirin  81 mg Oral QHS  . buprenorphine-naloxone  2 tablet Sublingual Daily  . Chlorhexidine Gluconate Cloth  6 each Topical QHS  . clopidogrel  75 mg Oral QHS  .  fluticasone  1 spray Each Nare Daily  . insulin aspart  0-15 Units Subcutaneous Q4H  . levothyroxine  88 mcg Oral Q0600  . loratadine  10 mg Oral Daily  . mouth rinse  15 mL Mouth Rinse BID  . metoprolol succinate  50 mg Oral BID  . midodrine  5 mg Oral TID WC  . mometasone-formoterol  2 puff Inhalation BID  . multivitamin with minerals  1 tablet Oral Daily  . pantoprazole  40 mg Oral Daily  . rosuvastatin  40 mg Oral QHS  . sodium chloride flush  3 mL Intravenous Q12H  . sodium chloride flush  3 mL Intravenous Q12H   Continuous Infusions: . sodium chloride Stopped (09/24/20 1321)  . sodium chloride Stopped (10/06/20 1418)  . meropenem (MERREM) IV 1 g (10/10/20 0625)     LOS: 30 days    Time spent: 35 minutes    Kathie Dike, MD [facility]  If 7PM-7AM, please contact night-coverage 10/10/2020, 3:45 PM

## 2020-10-10 NOTE — Progress Notes (Signed)
Physical Therapy Treatment Patient Details Name: Kathryn Garrett MRN: [id number] DOB: [birth year] Today's Date: 10/10/2020    History of Present Illness 75 y.o. female with history of CAD with VF arrest s/p PCI to the RCA in 2017 s/p PTCA to the RCA for ISR in 2018, chronic combined systolic and diastolic CHF, COPD with ongoing tobacco use, HTN, HLD, anemia, and narcotic abuse on Suboxone therapy admitted with acute hypoxic respiratory failure requiring mechanical ventilation (extubated 09/17/2020) secondary to COPD exacerbation Klebsiella PNA and acute on chronic combined CHF with transient ST elevation and echo demonstrating new LV dysfunction. Patient with recent transfer to ICU for blood pressure issues requiring pressor support    PT Comments    Ready for session.  Pt to EOB with rail and supervision.  Steady in sitting.  She is able to stand and march in place several times but is limited by increased HR to 130.  After transfer to recliner and short rest, ambulation is progressed from recliner to chair at end of bed.  She required min a x 1 for general safety, cues and occasional post lean/LOB.  Gait is limited by writer and increases to 130 on both gait attempts.  She could have walked a bit further but was fatigued with session.  Remains very motivated with supportive family.   Follow Up Recommendations  CIR     Equipment Recommendations  None recommended by PT    Recommendations for Other Services       Precautions / Restrictions Precautions Precautions: Fall Restrictions Weight Bearing Restrictions: No    Mobility  Bed Mobility Overal bed mobility: Needs Assistance Bed Mobility: Supine to Sit     Supine to sit: Supervision          Transfers Overall transfer level: Needs assistance Equipment used: Rolling walker (2 wheeled) Transfers: Sit to/from Stand Sit to Stand: Min assist            Ambulation/Gait Ambulation/Gait assistance: Min assist Gait  Distance (Feet): 10 Feet Assistive device: Rolling walker (2 wheeled) Gait Pattern/deviations: Narrow base of support Gait velocity: decreased   General Gait Details: 10' x 2 - from recliner to chair at end of bed and back.  limited by HR.  Stopped gait when increased to 130 but likely could have walked a bit further.   Stairs             Wheelchair Mobility    Modified Rankin (Stroke Patients Only)       Balance Overall balance assessment: Needs assistance Sitting-balance support: Feet supported;Single extremity supported Sitting balance-Leahy Scale: Fair     Standing balance support: Bilateral upper extremity supported;During functional activity Standing balance-Leahy Scale: Fair Standing balance comment: some post lean with hand on assist for safety                            Cognition Arousal/Alertness: Awake/alert Behavior During Therapy: WFL for tasks assessed/performed Overall Cognitive Status: Within Functional Limits for tasks assessed                                        Exercises      General Comments        Pertinent Vitals/Pain Pain Assessment: No/denies pain Pain Location: no pain but excessive itching from dry skin    Home Living  Prior Function            PT Goals (current goals can now be found in the care plan section) Progress towards PT goals: Progressing toward goals    Frequency    Min 2X/week      PT Plan Current plan remains appropriate    Co-evaluation              AM-PAC PT "6 Clicks" Mobility   Outcome Measure  Help needed turning from your back to your side while in a flat bed without using bedrails?: None Help needed moving from lying on your back to sitting on the side of a flat bed without using bedrails?: None Help needed moving to and from a bed to a chair (including a wheelchair)?: A Little Help needed standing up from a chair using your arms  (e.g., wheelchair or bedside chair)?: A Little Help needed to walk in hospital room?: A Little Help needed climbing 3-5 steps with a railing? : A Lot 6 Click Score: 19    End of Session Equipment Utilized During Treatment: Gait belt Activity Tolerance: Treatment limited secondary to medical complications (Comment) Patient left: in chair;with chair alarm set;with call bell/phone within reach;with family/visitor present Nurse Communication: Mobility status PT Visit Diagnosis: Difficulty in walking, not elsewhere classified (R26.2);Muscle weakness (generalized) (M62.81);Unsteadiness on feet (R26.81)     Time: 3888-2800 PT Time Calculation (min) (ACUTE ONLY): 28 min  Charges:  $Gait Training: 8-22 mins $Therapeutic Exercise: 8-22 mins                    Chesley Noon, PTA 10/10/20, 11:53 AM

## 2020-10-10 NOTE — TOC Progression Note (Signed)
Transition of Care [facility]) - Progression Note    Patient Details  Name: Kathryn Garrett MRN: [id number] Date of Birth: 09/29/1945  Transition of Care [facility]) CM/SW Contact  Izola Price, RN Phone Number: 10/10/2020, 1:27 PM  Clinical Narrative:  Patient is improving per provider's notes. Ready for PT. DC planning may shift to [facility] as CIR declines and SNF's are not wanting to accept due to ongoing Suboxone medication and patient insurance upheld an appeal.TOC will continue to monitor progress/disposition planning. Simmie Davies RN CM       Barriers to Discharge: Continued Medical Work up  Expected Discharge Plan and Services   In-house Referral: Clinical Social Work   Post Acute Care Choice:  [facility]) Living arrangements for the past 2 months: Single Family Home                                       Social Determinants of Health (SDOH) Interventions    Readmission Risk Interventions No flowsheet data found.

## 2020-10-10 NOTE — Progress Notes (Signed)
NAME:  Kathryn Garrett, MRN:  [id number], DOB:  07-04-1945, LOS: 56 ADMISSION DATE:  09/10/2020, INITIAL CONSULTATION DATE:  09/11/2020 REFERRING MD: Dr. Damita Dunnings, CHIEF COMPLAINT: SOB  Brief Patient Description  Ms. Halseth is a 75F with CAD status post RCA PCI in 2017 and in-stent restenosis requiring PCI, VF arrest,, chronic systolic diastolic heart failure, COPD, ongoing tobacco abuse, hypertension, hyperlipidemia, COPD, and narcotic abuse admitted with COPD exacerbation and Klebsiella pneumonia as well as acute on chronic systolic and diastolic heart failure.  Hospital course was complicated by cardiogenic shock requiring milrinone for diuresis and hypoxic respiratory failure requiring intubation. She was treated with Cefepime/Ancef for aspiration. She underwent left and right heart cath on 5/13 and was found to have mild to moderate nonobstructive CAD and normal filling pressures. She also had SVT that was managed with metoprolol. Patient was extubated and transferred to the floor. On 5/21 she was noted to be hypotensive despite IVFs and in sinus tachycardia to the 150s.  Lacate was 2.4.  Blood cultures with GNRs.  WBC was 14.8.  No significant infection in urine. CXR was unremarkable.  She was treated with Cefepime for Klebsiella pneumonia and transferred to ICU.  SHe was started on levophed for septic shock and PCCM re-consulted to assist with management.  Pertinent  Medical History  CAD status post RCA PCI in 2017 and in-stent restenosis requiring PCI,  VF arrest,,  chronic systolic diastolic heart failure,  COPD,  ongoing tobacco abuse,  hypertension,  hyperlipidemia,  Chronic narcotic abuse    Significant Hospital Events: Including procedures, antibiotic start and stop dates in addition to other pertinent events   09/10/2020-patient admits admitted with hospitalist service for COPD exacerbation and left lower lobe pneumonia 09/11/2020-overnight patient had suspected flash pulmonary  edema, dyspnea followed by somnolence requiring emergent intubation and mechanical ventilation.  Admitted to ICU 09/12/20- patient failed SBT , she had blood tinged secretions fromETT and aggitation, family was at bedside and we agreed on giving her more time and try again.  09/14/20- Failed SBT (became hypoxic with sats mid 80's, increased WOB and assessory muscle use, HR 140-160's). Will diurese today and add Metoprolol.  Tracheal aspirate from 4/30 with KLEBSIELLA PNEUMONIAE (resistant to Ampicillin)   09/15/20- Failed SBT (increased WOB and assessory muscle use, RR 40's, HR 140-150's, Hypertensive); plan to add scheduled PO Klonopin and Oxycodone, Lasix 40 mg IV x1, consult Palliative Care 09/16/20- Worsening Leukocytosis up to 21.7, low grade fever overnight, increased secretions overnight, will repeat Tracheal aspirate, remove central line, add mucinex, plan for SBT 09/17/20-[name] passed SBT in AM and following commands still having some moderate secretions 09/18/20-  Extubated yesterday.  Failed swallow eval today.  09/20/20- Weaned to room air, Leukocytosis improving; Hemodynamically stable 09/27/20- patient is coughing up phlegm but clinically is improved, we discussed pneumonia and COPD with outpatient clinic follow up with pulmonology.  There is plan for post dc rehab 09/28/20- patient is improved and she is being optimized for CIR, pulmonary will sign off and can follow up on outpatient basis 09/29/20- family at bedside today.  We discussed barium swallow with aspiration.  09/30/20- patient had PT today she walked today without need of additional supplemental O2 therapy.  She generally has been walking ok but has had severe LE weakness here.  She ate lunch infront of me had cough after couple bites.  We disucssed aspiartion risk and her barium swallow.  10/01/20: PCCM signed off  10/02/20: Pt with hypotension despite aggressive fluid resuscitation  concerning for sepsis secondary to UTI requiring levophed  gtt and transfer to stepdown unit.  PCCM signed back on case to assist with management  10/03/20: Remains on Levophed 10/04/20: Weaning down Levophed, increase Midodrine to 10 mg TID; consult ID for Bacteremia  10/05/20: Levophed weaned off, MRI spine yesterday negative for osteomyelitis discitis or septic arthritis, no epidural abscess or other fluid collection. Klebsiella is +ESBL.  ABX changed to Meropenem. 10/06/20: Deteriorated yesterday (fever, tachycardic, yesterday), Klebsiella ESBL +, ABX changed to Meropenem, requiring low dose Levophed again (currently on 5 mcg), ID following 10/06/20: Slowly weaning Levophed (currently at 3 mcg), WBC & Procalcitonin improving  10/07/20- patient is clinically improved, she is speaking and is oriented x3. Optimizing medically, vitals stabalizing, Procalcitonin trending down appropriately, Wbc count wnl, bmp is much improved, residual anemia on CBC.  PT/OT today 10/09/20- patient is seen at bedside, resp status improved. Son [name] at bedside states mother looks better.  She is ready for physical therapy.  She ate her entire meal this am which is a big improvement.  CBC and BMP normal except for mild chronic anemia.  10/10/20- patient is further improved. She had normal BM, she is not coughing. I met with family and reviewed plan for follow up. PCCM will sign off and available if needed.     Cultures:  4/29: SARS-CoV-2 PCR>> negative 4/29:Influenza PCR>> negative 5/1: Strep pneumo urinary antigen>>negative 5/1: Legionella urinary antigen>>negative 5/5: Tracheal aspirate> candida albicans 5/21: Blood culture x2>>3 out 4 bottles growing GNR BCID positive for klebsiella pneumoniae (+ESBL) 5/21: UCx> negative  Antimicrobials:  Ceftriaxone 5/22>>5/24 Meropenem 5/24>> Cefepime 5/21 x 1 dose Metronidazole 5/21 x 1 dose   OBJECTIVE   Blood pressure 133/64, pulse 89, temperature 98.3 F (36.8 C), resp. rate 18, height 4' 11" (1.499 m), weight 57.1 kg, SpO2  91 %.        Intake/Output Summary (Last 24 hours) at 10/10/2020 1104 Last data filed at 10/10/2020 0800 Gross per 24 hour  Intake --  Output 1800 ml  Net -1800 ml   Filed Weights   10/08/20 0500 10/09/20 0453 10/10/20 0500  Weight: 54 kg 60.9 kg 57.1 kg   Physical Examination: GENERAL:age appropriate 75 year-old patient, sitting in bed, no acute distress.  EYES: Pupils PERRLA. No scleral icterus. Extraocular muscles intact.  HEENT: Head atraumatic, normocephalic. Oropharynx and nasopharynx clear.  NECK:  Supple, no jugular venous distention. No thyroid enlargement, no tenderness.  LUNGS: Clear breath sounds bilaterally, no wheezing, rales,rhonchi or crepitation. No use of accessory muscles of respiration.  CARDIOVASCULAR: Tachycardia, regular rhythm, S1, S2. No murmurs, rubs, or gallops. 2+ distal pulses ABDOMEN: Soft, nontender, nondistended. Bowel sounds present. No organomegaly or mass.  EXTREMITIES: No pedal edema, cyanosis, or clubbing.  NEUROLOGIC: Cranial nerves II through XII are intact. Muscle strength 5/5 in all extremities. Sensation intact. Gait not checked.  PSYCHIATRIC: The patient is alert and oriented x 4.  SKIN: Warm and dry. No obvious rash, lesion, or ulcer.     Labs   CBC: Recent Labs  Lab 10/06/20 0413 10/07/20 0438 10/08/20 0519 10/09/20 0618 10/10/20 0628  WBC 11.2* 8.6 6.9 7.9 9.3  NEUTROABS 7.4 4.9 3.4 4.1 5.3  HGB 10.6* 9.6* 9.1* 10.0* 10.7*  HCT 32.7* 29.8* 28.3* 31.6* 33.9*  MCV 93.2 93.4 94.6 95.2 95.8  PLT 255 258 249 298 022    Basic Metabolic Panel: Recent Labs  Lab 10/04/20 0407 10/05/20 0325 10/06/20 0413 10/07/20 0438 10/08/20 0519 10/09/20 0618  NA  141 134* 139 139  140 140 142  K 3.5 4.1 4.3 3.7  3.7 3.5 4.3  CL 108 101 101 104  105 106 109  CO2 _0 GLUCOSE 123* 101* 117* 111*  111* 96 109*  BUN _1 CREATININE 0.44 0.49 0.66 0.54  0.50 0.47 0.44  CALCIUM 8.4* 8.2* 8.4* 8.3*   8.4* 8.3* 8.9  MG 1.8 2.1 2.3  --   --  2.3  PHOS 2.9 2.5 3.5 3.3  --   --    GFR: Estimated Creatinine Clearance: 46.8 mL/min (by C-G formula based on SCr of 0.44 mg/dL). Recent Labs  Lab 10/06/20 0413 10/07/20 0438 10/08/20 0519 10/09/20 0618 10/10/20 0628  PROCALCITON 39.42 23.89 7.81  --   --   WBC 11.2* 8.6 6.9 7.9 9.3    Liver Function Tests: Recent Labs  Lab 10/07/20 0438  ALBUMIN 2.3*   No results for input(s): LIPASE, AMYLASE in the last 168 hours. No results for input(s): AMMONIA in the last 168 hours.  ABG    Component Value Date/Time   PHART 7.48 (H) 10/05/2020 1000   PCO2ART 28 (L) 10/05/2020 1000   PO2ART 66 (L) 10/05/2020 1000   HCO3 20.9 10/05/2020 1000   TCO2 23 01/14/2009 0327   ACIDBASEDEF 1.6 10/05/2020 1000   O2SAT 94.2 10/05/2020 1000     Coagulation Profile: No results for input(s): INR, PROTIME in the last 168 hours.  Cardiac Enzymes: No results for input(s): CKTOTAL, CKMB, CKMBINDEX, TROPONINI in the last 168 hours.  HbA1C: Hgb A1c MFr Bld  Date/Time Value Ref Range Status  09/11/2020 04:37 AM 6.4 (H) 4.8 - 5.6 % Final    Comment:    (NOTE) Pre diabetes:          5.7%-6.4%  Diabetes:              >6.4%  Glycemic control for   <7.0% adults with diabetes   10/02/2019 12:28 PM 6.4 (H) 4.8 - 5.6 % Final    Comment:             Prediabetes: 5.7 - 6.4          Diabetes: >6.4          Glycemic control for adults with diabetes: <7.0     CBG: Recent Labs  Lab 10/09/20 1647 10/09/20 2018 10/09/20 2339 10/10/20 0404 10/10/20 0758  GLUCAP 128* 121* 127* 136* 120*    Allergies Allergies  Allergen Reactions  . Azithromycin Rash     Home Medications  Prior to Admission medications   Medication Sig Start Date End Date Taking? Authorizing Provider  acetaminophen (TYLENOL) 500 MG tablet Take 1,000 mg by mouth every 6 (six) hours as needed for headache.   Yes [provider]  albuterol (PROVENTIL HFA;VENTOLIN HFA)  108 (90 Base) MCG/ACT inhaler Inhale 2 puffs into the lungs every 4 (four) hours as needed for wheezing or shortness of breath.   Yes [provider]  amoxicillin (AMOXIL) 500 MG tablet Take 500 mg by mouth 3 (three) times daily. 08/03/20  Yes [provider]  aspirin EC 81 MG tablet Take 81 mg by mouth at bedtime.    Yes [provider]  azelastine (ASTELIN) 0.1 % nasal spray USE 2 SPRAYS BID UNTIL DIRECTED TO STOP. USE FOR RUNNY NOSE 08/14/17  Yes [provider]  Buprenorphine HCl-Naloxone HCl 8-2 MG FILM Place 0.5 Film  under the tongue daily.   Yes [provider]  cetirizine (ZYRTEC) 10 MG tablet Take 1 tablet (10 mg total) by mouth daily. 03/11/18  Yes Alene Mires, Sahar M, PA-C  clopidogrel (PLAVIX) 75 MG tablet TAKE 1 TABLET(75 MG) BY MOUTH AT BEDTIME 09/16/20  Yes Jettie Booze, MD  dexamethasone (DECADRON) 6 MG tablet dexamethasone 6 mg tablet  TAKE 1 TABLET BY MOUTH EVERY DAY   Yes [provider]  doxycycline (VIBRAMYCIN) 100 MG capsule doxycycline hyclate 100 mg capsule  TAKE 1 CAPSULE BY MOUTH TWICE DAILY   Yes [provider]  fluticasone (FLONASE) 50 MCG/ACT nasal spray Place 1 spray into both nostrils daily.   Yes [provider]  furosemide (LASIX) 40 MG tablet Take 1 tablet (40 mg total) by mouth daily as needed (swelling). 01/16/20  Yes Jettie Booze, MD  ibuprofen (ADVIL,MOTRIN) 600 MG tablet Take 600 mg by mouth as needed for pain. 08/14/17  Yes [provider]  isosorbide mononitrate (IMDUR) 30 MG 24 hr tablet TAKE 1 TABLET(30 MG) BY MOUTH DAILY 09/16/20  Yes Jettie Booze, MD  levothyroxine (SYNTHROID) 88 MCG tablet Take 88 mcg by mouth every morning. 05/26/20  Yes [provider]  metoprolol tartrate (LOPRESSOR) 25 MG tablet TAKE 1 TABLET BY MOUTH TWICE DAILY 09/16/20  Yes Jettie Booze, MD  mupirocin ointment (BACTROBAN) 2 % mupirocin 2 % topical ointment  APPLY TOPICALLY TO  LOWER LEG THREE TIMES DAILY FOR 10 DAYS   Yes [provider]  nystatin cream (MYCOSTATIN) Apply topically 2 (two) times daily. 08/11/20  Yes [provider]  potassium chloride SA (KLOR-CON) 20 MEQ tablet Take 1 tablet (20 mEq total) by mouth daily as needed (Take with furosemide (lasix)). 01/16/20  Yes Jettie Booze, MD  predniSONE (DELTASONE) 20 MG tablet prednisone 20 mg tablet   Yes [provider]  rosuvastatin (CRESTOR) 20 MG tablet TAKE 1 TABLET(20 MG) BY MOUTH DAILY 09/16/20  Yes Jettie Booze, MD  TRELEGY ELLIPTA 100-62.5-25 MCG/INH AEPB Take 1 puff by mouth daily. 08/13/20  Yes [provider]  triamcinolone cream (KENALOG) 0.1 % Apply topically 2 (two) times daily. to affected area 08/11/20  Yes [provider]  metoprolol succinate (TOPROL-XL) 50 MG 24 hr tablet Take 1 tablet (50 mg total) by mouth daily. Take with or immediately following a meal. 09/29/20   Domenic Polite, MD  nitroGLYCERIN (NITROSTAT) 0.4 MG SL tablet Place 1 tablet (0.4 mg total) under the tongue every 5 (five) minutes as needed for chest pain. 03/12/18   Jettie Booze, MD  pantoprazole (PROTONIX) 40 MG tablet Take 1 tablet (40 mg total) by mouth daily. 09/29/20   Domenic Polite, MD  sacubitril-valsartan (ENTRESTO) 24-26 MG Take 1 tablet by mouth 2 (two) times daily. 09/28/20   Domenic Polite, MD   Scheduled Meds: . aspirin  81 mg Oral QHS  . buprenorphine-naloxone  2 tablet Sublingual Daily  . Chlorhexidine Gluconate Cloth  6 each Topical QHS  . clopidogrel  75 mg Oral QHS  . fluticasone  1 spray Each Nare Daily  . insulin aspart  0-15 Units Subcutaneous Q4H  . levothyroxine  88 mcg Oral Q0600  . loratadine  10 mg Oral Daily  . mouth rinse  15 mL Mouth Rinse BID  . metoprolol succinate  50 mg Oral BID  . midodrine  5 mg Oral TID WC  . mometasone-formoterol  2 puff Inhalation BID  . multivitamin with minerals  1  tablet Oral Daily  . pantoprazole  40  mg Oral Daily  . rosuvastatin  40 mg Oral QHS  . sodium chloride flush  3 mL Intravenous Q12H  . sodium chloride flush  3 mL Intravenous Q12H   Continuous Infusions: . sodium chloride Stopped (09/24/20 1321)  . sodium chloride Stopped (10/06/20 1418)  . meropenem (MERREM) IV 1 g (10/10/20 0625)   PRN Meds:.acetaminophen **OR** acetaminophen, albuterol, artificial tears, docusate, guaiFENesin, ondansetron (ZOFRAN) IV, polyethylene glycol  Resolved Hospital Problem list     ASSESSMENT & PLAN   Septic shock     Due to Klebsiella pneumonia    - present on admission  Previous Klebsiella Pneumonia ~ treated (Completed 7 days of Cefepime/Ancef) -Monitor fever curve -Trend WBC's & Procalcitonin -Follow cultures as above -Blood cultures on 10/02/20 with klebsiella pneumoniae & Enterobacteriaceae  -ABX changed to Meropenem 10/05/20 -Urinalysis and urine culture on 5/21 negative (? Could be impacted by previous exposure to antibiotics) -CXR on 5/24 without new/worsening opacities, shows unchanged faint bibasilar opacities consistent with resolving pneumonia -MRI spine 10/04/20 negative for osteomyelitis discitis or septic arthritis within the cervical, thoracic, or lumbar spine. No epidural abscess or other collection. -ID following, appreciate input ~ will follow recommendations ~ recommends 7 days of Meropenem  Acute decompensated systolic congestive heart failure with EF 25%   - exacerbated by Klebsiella infection with pneumonia and sepsis   - cardiology on case - appreciate input-Dr Gollan                                   LVEF reduced to 25-30% Catheterization has been completed, no intervention needed -Metoprolol as blood pressure tolerates above Other cardiomyopathy medications on hold for low blood pressure including Entresto  -Once recovered from sepsis could consider adding Farxiga/Jardiance    -   Sinus tachycardia    Cardiology on case - appreciate input  -Continuous  cardiac monitoring -Maintain MAP >78    Acute metabolic/toxic encephalopathy ~ resolved  -Continue outpatient Suboxone due to narcotic abuse hx        Best practice (right click and "Reselect all SmartList Selections" daily)   Diet: DYS 3 Pain/Anxiety/Delirium protocol: not indicated  VAP protocol: not indicated  DVT prophylaxis: SCD and subq lovenox  GI prophylaxis: PPI Glucose control:  SSI Yes Central venous access: n/a Arterial line:  N/A Foley: N/A Mobility: OOB  PT consulted: yes Code Status:  full code Disposition: med surg      Ottie Glazier, M.D.  [facility] [place]

## 2020-10-10 NOTE — Progress Notes (Signed)
Progress Note  Patient Name: Kathryn Garrett Date of Encounter: 10/10/2020  [facility] Cardiologist: Larae Grooms, MD   Subjective   Family at the bedside,  Reports that she was able to ambulate a little bit in the room with a walker and back again Denies shortness of breath, has been off oxygen Telemetry showing normal sinus rhythm Midodrine decreased yesterday with higher dose metoprolol  Inpatient Medications    Scheduled Meds: . aspirin  81 mg Oral QHS  . buprenorphine-naloxone  2 tablet Sublingual Daily  . Chlorhexidine Gluconate Cloth  6 each Topical QHS  . clopidogrel  75 mg Oral QHS  . fluticasone  1 spray Each Nare Daily  . insulin aspart  0-15 Units Subcutaneous Q4H  . levothyroxine  88 mcg Oral Q0600  . loratadine  10 mg Oral Daily  . mouth rinse  15 mL Mouth Rinse BID  . metoprolol succinate  50 mg Oral BID  . midodrine  5 mg Oral TID WC  . mometasone-formoterol  2 puff Inhalation BID  . multivitamin with minerals  1 tablet Oral Daily  . pantoprazole  40 mg Oral Daily  . rosuvastatin  40 mg Oral QHS  . sodium chloride flush  3 mL Intravenous Q12H  . sodium chloride flush  3 mL Intravenous Q12H   Continuous Infusions: . sodium chloride Stopped (09/24/20 1321)  . sodium chloride Stopped (10/06/20 1418)  . meropenem (MERREM) IV 1 g (10/10/20 0625)   PRN Meds: acetaminophen **OR** acetaminophen, albuterol, artificial tears, docusate, guaiFENesin, ondansetron (ZOFRAN) IV, polyethylene glycol   Vital Signs    Vitals:   10/10/20 0407 10/10/20 0500 10/10/20 0800 10/10/20 1152  BP: (!) 144/65  133/64 (!) 112/56  Pulse: 92  89 97  Resp: 14  18 18   Temp: 98.4 F (36.9 C)  98.3 F (36.8 C) 97.9 F (36.6 C)  TempSrc: Oral   Oral  SpO2: 91%  91% 95%  Weight:  57.1 kg    Height:        Intake/Output Summary (Last 24 hours) at 10/10/2020 1336 Last data filed at 10/10/2020 0800 Gross per 24 hour  Intake --  Output 1800 ml  Net -1800 ml   Last  3 Weights 10/10/2020 10/09/2020 10/08/2020  Weight (lbs) 125 lb 14.1 oz 134 lb 4.8 oz 119 lb 0.8 oz  Weight (kg) 57.1 kg 60.918 kg 54 kg      Telemetry    Normal sinus rhythm reviewed personally by myself  ECG     - Personally Reviewed  Physical Exam   Constitutional:  oriented to person, place, and time. No distress.  HENT:  Head: Grossly normal Eyes:  no discharge. No scleral icterus.  Neck: No JVD, no carotid bruits  Cardiovascular: Regular rate and rhythm, no murmurs appreciated Pulmonary/Chest: Clear to auscultation bilaterally, no wheezes or rails Abdominal: Soft.  no distension.  no tenderness.  Musculoskeletal: Normal range of motion Neurological:  normal muscle tone. Coordination normal. No atrophy Skin: Skin warm and dry Psychiatric: normal affect, pleasant   Labs    High Sensitivity Troponin:   Recent Labs  Lab 09/10/20 2050 09/11/20 0437 09/11/20 1433 09/11/20 1611 09/11/20 2011  TROPONINIHS 1,838* 2,434* 2,265* 2,455* 2,317*      Chemistry Recent Labs  Lab 10/07/20 0438 10/08/20 0519 10/09/20 0618  NA 139  140 140 142  K 3.7  3.7 3.5 4.3  CL 104  105 106 109  CO2 26  27 26 25   GLUCOSE  111*  111* 96 109*  BUN 12  12 8 9   CREATININE 0.54  0.50 0.47 0.44  CALCIUM 8.3*  8.4* 8.3* 8.9  ALBUMIN 2.3*  --   --   GFRNONAA >60  >60 >60 >60  ANIONGAP 9  8 8 8      Hematology Recent Labs  Lab 10/08/20 0519 10/09/20 0618 10/10/20 0628  WBC 6.9 7.9 9.3  RBC 2.99* 3.32* 3.54*  HGB 9.1* 10.0* 10.7*  HCT 28.3* 31.6* 33.9*  MCV 94.6 95.2 95.8  MCH 30.4 30.1 30.2  MCHC 32.2 31.6 31.6  RDW 14.6 14.6 14.7  PLT 249 298 364    BNPNo results for input(s): BNP, PROBNP in the last 168 hours.   DDimer  No results for input(s): DDIMER in the last 168 hours.   Radiology    No results found.  Cardiac Studies     Patient Profile     75 y.o. female with CAD status post RCA PCI in 2017 and in-stent restenosis requiring PCI, VF arrest,,  chronic systolic diastolic heart failure, COPD, ongoing tobacco abuse, hypertension, hyperlipidemia, COPD, and narcotic abuse admitted with COPD exacerbation and Klebsiella pneumonia as well as acute on chronic systolic and diastolic heart failure.  -- elevated troponin. Echo : EF 25 to 30% -- cardiogenic shock requiring milrinone for diuresis and hypoxic respiratory failure requiring intubation.  left and right heart cath on 5/13 ,  mild to moderate nonobstructive CAD and normal filling pressures.   SVT ,  managed with metoprolol.   Assessment & Plan    SVT/Sinus tachycardia -Paroxysmal,  Well controlled recently, maintaining normal sinus rhythm Initially treated with amiodarone, since discontinued -Beta-blocker held in the setting of sepsis and hypotension but as blood pressure has recovered, metoprolol reinitiated -Metoprolol succinate up to 50 twice daily yesterday Still on low-dose midodrine, will look to wean midodrine as tolerated, possibly in the next day or 2  Bacteremia/sepsis with hypotension on pressors Enterobacter and Klebsiella pneumonia on blood cultures,  mentating better On broad-spectrum antibiotics Sepsis picture slowly improving, blood pressure stabilizing, off pressors -Midodrine down from 10 to 5 yesterday, will look to wean down on the midodrine as blood pressure tolerates  CAD s/p PCI Elevated troponin - cardiac cath showed nonobstructive diseaseabove - continue ASA, Plavix, statin Tolerating metoprolol succinate 50 twice daily, close monitoring of blood pressure and  Acute systolic and diastolic heart failure - LVEF reduced to 25-30% Catheterization has been completed, no intervention needed Sepsis, hypotension, now off pressors -Looking to wean midodrine over the next several days -Tolerating metoprolol succinate 50 twice daily, adequate rate control As midodrine weaned off, would look to initiate ARB/Entresto At a later date add  Farxiga/Jardiance  Long discussion concerning her need to continue aggressive rehab Discussed cardiac rehab in the future, chair exercises  Total encounter time more than 35 minutes  Greater than 50% was spent in counseling and coordination of care with the patient   For questions or updates, please contact [facility] Please consult [email or web address] for contact info under        Signed, Ida Rogue, MD  10/10/2020, 1:36 PM

## 2020-10-11 DIAGNOSIS — R0603 Acute respiratory distress: Secondary | ICD-10-CM | POA: Diagnosis not present

## 2020-10-11 DIAGNOSIS — I5023 Acute on chronic systolic (congestive) heart failure: Secondary | ICD-10-CM | POA: Diagnosis not present

## 2020-10-11 DIAGNOSIS — J150 Pneumonia due to Klebsiella pneumoniae: Secondary | ICD-10-CM

## 2020-10-11 DIAGNOSIS — J441 Chronic obstructive pulmonary disease with (acute) exacerbation: Secondary | ICD-10-CM | POA: Diagnosis not present

## 2020-10-11 DIAGNOSIS — I5041 Acute combined systolic (congestive) and diastolic (congestive) heart failure: Secondary | ICD-10-CM | POA: Diagnosis not present

## 2020-10-11 DIAGNOSIS — I2510 Atherosclerotic heart disease of native coronary artery without angina pectoris: Secondary | ICD-10-CM | POA: Diagnosis not present

## 2020-10-11 DIAGNOSIS — R7881 Bacteremia: Secondary | ICD-10-CM

## 2020-10-11 DIAGNOSIS — J9601 Acute respiratory failure with hypoxia: Secondary | ICD-10-CM | POA: Diagnosis not present

## 2020-10-11 DIAGNOSIS — B961 Klebsiella pneumoniae [K. pneumoniae] as the cause of diseases classified elsewhere: Secondary | ICD-10-CM

## 2020-10-11 LAB — CBC WITH DIFFERENTIAL/PLATELET
Abs Immature Granulocytes: 0.13 10*3/uL — ABNORMAL HIGH (ref 0.00–0.07)
Basophils Absolute: 0.1 10*3/uL (ref 0.0–0.1)
Basophils Relative: 1 %
Eosinophils Absolute: 0.2 10*3/uL (ref 0.0–0.5)
Eosinophils Relative: 2 %
HCT: 35 % — ABNORMAL LOW (ref 36.0–46.0)
Hemoglobin: 11.2 g/dL — ABNORMAL LOW (ref 12.0–15.0)
Immature Granulocytes: 1 %
Lymphocytes Relative: 28 %
Lymphs Abs: 3.1 10*3/uL (ref 0.7–4.0)
MCH: 30.3 pg (ref 26.0–34.0)
MCHC: 32 g/dL (ref 30.0–36.0)
MCV: 94.6 fL (ref 80.0–100.0)
Monocytes Absolute: 0.9 10*3/uL (ref 0.1–1.0)
Monocytes Relative: 8 %
Neutro Abs: 6.5 10*3/uL (ref 1.7–7.7)
Neutrophils Relative %: 60 %
Platelets: 433 10*3/uL — ABNORMAL HIGH (ref 150–400)
RBC: 3.7 MIL/uL — ABNORMAL LOW (ref 3.87–5.11)
RDW: 14.7 % (ref 11.5–15.5)
WBC: 10.8 10*3/uL — ABNORMAL HIGH (ref 4.0–10.5)
nRBC: 0 % (ref 0.0–0.2)

## 2020-10-11 LAB — GLUCOSE, CAPILLARY
Glucose-Capillary: 103 mg/dL — ABNORMAL HIGH (ref 70–99)
Glucose-Capillary: 105 mg/dL — ABNORMAL HIGH (ref 70–99)
Glucose-Capillary: 108 mg/dL — ABNORMAL HIGH (ref 70–99)
Glucose-Capillary: 116 mg/dL — ABNORMAL HIGH (ref 70–99)
Glucose-Capillary: 130 mg/dL — ABNORMAL HIGH (ref 70–99)
Glucose-Capillary: 137 mg/dL — ABNORMAL HIGH (ref 70–99)
Glucose-Capillary: 98 mg/dL (ref 70–99)

## 2020-10-11 LAB — BASIC METABOLIC PANEL
Anion gap: 9 (ref 5–15)
BUN: 15 mg/dL (ref 8–23)
CO2: 25 mmol/L (ref 22–32)
Calcium: 9 mg/dL (ref 8.9–10.3)
Chloride: 105 mmol/L (ref 98–111)
Creatinine, Ser: 0.58 mg/dL (ref 0.44–1.00)
GFR, Estimated: >60 mL/min (ref >60)
Glucose, Bld: 105 mg/dL — ABNORMAL HIGH (ref 70–99)
Potassium: 4.2 mmol/L (ref 3.5–5.1)
Sodium: 139 mmol/L (ref 135–145)

## 2020-10-11 MED ORDER — LOSARTAN POTASSIUM 25 MG PO TABS
25.0000 mg | ORAL_TABLET | Freq: Every day | ORAL | Status: DC
  Administered 2020-10-11 – 2020-10-13 (×3): 25 mg via ORAL
  Filled 2020-10-11 (×3): qty 1

## 2020-10-11 MED ORDER — MIDODRINE HCL 5 MG PO TABS: 2.5000 mg | ORAL_TABLET | Freq: Three times a day (TID) | ORAL | Status: DC

## 2020-10-11 NOTE — Progress Notes (Signed)
Date of Admission:  09/10/2020   current antibiotic meropenem from 10/05/2020.     ID: Kathryn Garrett is a 75 y.o. female  Principal Problem:   Acute exacerbation of chronic obstructive pulmonary disease (COPD) ([place]) Active Problems:   CAP (community acquired pneumonia)   CAD in native artery   HTN (hypertension)   Tobacco abuse   Hyperlipidemia LDL goal <70   Severe sepsis (HCC)   NSTEMI (non-ST elevated myocardial infarction) ([place])   Hyperglycemia   SOB (shortness of breath)   Acute respiratory failure (HCC)   HFrEF (heart failure with reduced ejection fraction) (HCC)   Acute on chronic HFrEF (heart failure with reduced ejection fraction) (HCC)   SVT (supraventricular tachycardia) (HCC)   Acute combined systolic and diastolic heart failure ([place])   Pure hypercholesterolemia  LDA CVC triple-lumen placement 09/12/2020 and removal 09/16/2020. Ureteral catheter placement 09/11/2020 and removal 5-2/22. Airway placement 09/11/2020.  Removal 09/17/2020.  Subjective: She is feeling better No sob appetite fair  Medications:  . aspirin  81 mg Oral QHS  . buprenorphine-naloxone  2 tablet Sublingual Daily  . Chlorhexidine Gluconate Cloth  6 each Topical QHS  . clopidogrel  75 mg Oral QHS  . fluticasone  1 spray Each Nare Daily  . insulin aspart  0-15 Units Subcutaneous Q4H  . levothyroxine  88 mcg Oral Q0600  . loratadine  10 mg Oral Daily  . losartan  25 mg Oral Daily  . mouth rinse  15 mL Mouth Rinse BID  . metoprolol succinate  50 mg Oral BID  . mometasone-formoterol  2 puff Inhalation BID  . multivitamin with minerals  1 tablet Oral Daily  . pantoprazole  40 mg Oral Daily  . rosuvastatin  40 mg Oral QHS  . sodium chloride flush  3 mL Intravenous Q12H  . sodium chloride flush  3 mL Intravenous Q12H    Objective: Vital signs in last 24 hours: Temp:  [97.6 F (36.4 C)-98.6 F (37 C)] 97.6 F (36.4 C) (05/30 0829) Pulse Rate:  [88-98] 88 (05/30 0829) Resp:  [17-18] 18  (05/30 0829) BP: (112-126)/(46-80) 126/50 (05/30 0829) SpO2:  [94 %-96 %] 96 % (05/30 0829)  PHYSICAL EXAM:  General: Alert, cooperative, no distress, appears stated age.  Head: Normocephalic, without obvious abnormality, atraumatic. Eyes: Conjunctivae clear, anicteric sclerae. Pupils are equal ENT Nares normal. No drainage or sinus tenderness. Lips, mucosa, and tongue normal. No Thrush Neck: Supple, symmetrical, no adenopathy, thyroid: non tender no carotid bruit and no JVD. Back: No CVA tenderness. Lungs: b/l air entry Heart: Regular rate and rhythm, no murmur, rub or gallop. Abdomen: Soft, non-tender,not distended. Bowel sounds normal. No masses Extremities: atraumatic, no cyanosis. No edema. No clubbing Skin: No rashes or lesions. Or bruising Lymph: Cervical, supraclavicular normal. Neurologic: Grossly non-focal  Lab Results Recent Labs    10/09/20 0618 10/10/20 0628 10/11/20 0639  WBC 7.9 9.3 10.8*  HGB 10.0* 10.7* 11.2*  HCT 31.6* 33.9* 35.0*  NA 142  --  139  K 4.3  --  4.2  CL 109  --  105  CO2 25  --  25  BUN 9  --  15  CREATININE 0.44  --  0.58   Liver Panel No results for input(s): PROT, ALBUMIN, AST, ALT, ALKPHOS, BILITOT, BILIDIR, IBILI in the last 72 hours. Sedimentation Rate No results for input(s): ESRSEDRATE in the last 72 hours. C-Reactive Protein No results for input(s): CRP in the last 72 hours.  Microbiology: 09/11/20- Resp  culture kleb 5/21 BC - ESBL: kleb  Studies/Results: 09/11/20: Multilobar pneumonia involving the left upper lobe and early right lower lobe involvement.    10/04/2020 chest x-ray.  Resolving pneumonia with faint bibasilar hazy opacities.  Assessment/Plan: Klebsiella pneumonia (pansensitive except for ampicillin) CAP Present on Admission on 09/11/2020..  Treated with cefepime 4/29 until 5/ 3  Acute respiratory failure due to pneumonia and flash pulmonary edema leading to intubation.  Extubated on 09/17/2020.  COPD  New  onset fever on 10/02/2020 and blood culture positive for ESBL Klebsiella.  ESBL Klebsiella bacteremia diagnosed on 10/02/2020.  Currently on meropenem.  Started on 10/05/2020.  Will finish 7 days of treatment on 10/12/2020.  ID will sign off.  Call if needed.

## 2020-10-11 NOTE — TOC Progression Note (Signed)
Transition of Care [facility]) - Progression Note    Patient Details  Name: Kathryn Garrett MRN: [id number] Date of Birth: 1945-12-21  Transition of Care [facility]) CM/SW [place], [place] Phone Number: 10/11/2020, 12:51 PM  Clinical Narrative:     CSW followed up with bed offers of [facility] and [facility]. [facility] reports they do not accept suboxone patients, and Chanda Busing reports their admissions coordinator Estill Bamberg would be in tomorrow to call back.   [facility] did report that [employer] takes Suboxone patients in [place].   CSW spoke with Santiago Glad in admissions at [facility] at [phone]. She reports they do accept Suboxone and [employer] patients, reports the hospital would fill the prescription or the anticipated SNF length of stay to be transported with patient at discharge.   Santiago Glad reports CSW can fax referral to [phone].   Referral faxed, Santiago Glad reports she will follow up tomorrow.     Barriers to Discharge: Continued Medical Work up  Expected Discharge Plan and Services   In-house Referral: Clinical Social Work   Post Acute Care Choice:  [facility]) Living arrangements for the past 2 months: Single Family Home                                       Social Determinants of Health (SDOH) Interventions    Readmission Risk Interventions No flowsheet data found.

## 2020-10-11 NOTE — Progress Notes (Signed)
NAME:  Kathryn Garrett, MRN:  [id number], DOB:  [birth year], LOS: 44 ADMISSION DATE:  09/10/2020, INITIAL CONSULTATION DATE:  09/11/2020 REFERRING MD: Dr. Damita Dunnings, CHIEF COMPLAINT: SOB  Brief Patient Description  Kathryn Garrett is a 75F with CAD status post RCA PCI in 2017 and in-stent restenosis requiring PCI, VF arrest,, chronic systolic diastolic heart failure, COPD, ongoing tobacco abuse, hypertension, hyperlipidemia, COPD, and narcotic abuse admitted with COPD exacerbation and Klebsiella pneumonia as well as acute on chronic systolic and diastolic heart failure.  Hospital course was complicated by cardiogenic shock requiring milrinone for diuresis and hypoxic respiratory failure requiring intubation. She was treated with Cefepime/Ancef for aspiration. She underwent left and right heart cath on 5/13 and was found to have mild to moderate nonobstructive CAD and normal filling pressures. She also had SVT that was managed with metoprolol. Patient was extubated and transferred to the floor. On 5/21 she was noted to be hypotensive despite IVFs and in sinus tachycardia to the 150s.  Lacate was 2.4.  Blood cultures with GNRs.  WBC was 14.8.  No significant infection in urine. CXR was unremarkable.  She was treated with Cefepime for Klebsiella pneumonia and transferred to ICU.  SHe was started on levophed for septic shock and PCCM re-consulted to assist with management.  Pertinent  Medical History  CAD status post RCA PCI in 2017 and in-stent restenosis requiring PCI,  VF arrest,,  chronic systolic diastolic heart failure,  COPD,  ongoing tobacco abuse,  hypertension,  hyperlipidemia,  Chronic narcotic abuse    Significant Hospital Events: Including procedures, antibiotic start and stop dates in addition to other pertinent events   09/10/2020-patient admits admitted with hospitalist service for COPD exacerbation and left lower lobe pneumonia 09/11/2020-overnight patient had suspected flash pulmonary  edema, dyspnea followed by somnolence requiring emergent intubation and mechanical ventilation.  Admitted to ICU 09/12/20- patient failed SBT , she had blood tinged secretions fromETT and aggitation, family was at bedside and we agreed on giving her more time and try again.  09/14/20- Failed SBT (became hypoxic with sats mid 80's, increased WOB and assessory muscle use, HR 140-160's). Will diurese today and add Metoprolol.  Tracheal aspirate from 4/30 with KLEBSIELLA PNEUMONIAE (resistant to Ampicillin)   09/15/20- Failed SBT (increased WOB and assessory muscle use, RR 40's, HR 140-150's, Hypertensive); plan to add scheduled PO Klonopin and Oxycodone, Lasix 40 mg IV x1, consult Palliative Care 09/16/20- Worsening Leukocytosis up to 21.7, low grade fever overnight, increased secretions overnight, will repeat Tracheal aspirate, remove central line, add mucinex, plan for SBT 09/17/20-[name] passed SBT in AM and following commands still having some moderate secretions 09/18/20-  Extubated yesterday.  Failed swallow eval today.  09/20/20- Weaned to room air, Leukocytosis improving; Hemodynamically stable 09/27/20- patient is coughing up phlegm but clinically is improved, we discussed pneumonia and COPD with outpatient clinic follow up with pulmonology.  There is plan for post dc rehab 09/28/20- patient is improved and she is being optimized for CIR, pulmonary will sign off and can follow up on outpatient basis 09/29/20- family at bedside today.  We discussed barium swallow with aspiration.  09/30/20- patient had PT today she walked today without need of additional supplemental O2 therapy.  She generally has been walking ok but has had severe LE weakness here.  She ate lunch infront of me had cough after couple bites.  We disucssed aspiartion risk and her barium swallow.  10/01/20: PCCM signed off  10/02/20: Pt with hypotension despite aggressive fluid resuscitation  concerning for sepsis secondary to UTI requiring levophed  gtt and transfer to stepdown unit.  PCCM signed back on case to assist with management  10/03/20: Remains on Levophed 10/04/20: Weaning down Levophed, increase Midodrine to 10 mg TID; consult ID for Bacteremia  10/05/20: Levophed weaned off, MRI spine yesterday negative for osteomyelitis discitis or septic arthritis, no epidural abscess or other fluid collection. Klebsiella is +ESBL.  ABX changed to Meropenem. 10/06/20: Deteriorated yesterday (fever, tachycardic, yesterday), Klebsiella ESBL +, ABX changed to Meropenem, requiring low dose Levophed again (currently on 5 mcg), ID following 10/06/20: Slowly weaning Levophed (currently at 3 mcg), WBC & Procalcitonin improving  10/07/20- patient is clinically improved, she is speaking and is oriented x3. Optimizing medically, vitals stabalizing, Procalcitonin trending down appropriately, Wbc count wnl, bmp is much improved, residual anemia on CBC.  PT/OT today 10/09/20- patient is seen at bedside, resp status improved. Son [name] at bedside states mother looks better.  She is ready for physical therapy.  She ate her entire meal this am which is a big improvement.  CBC and BMP normal except for mild chronic anemia.   10/11/20- patient is further improved. She had normal BM, she is not coughing. She is on last day of Meropenem. She is on suboxone which is hindering her discharge. Reviewed care plan with Dr Roderic Palau.      Cultures:  4/29: SARS-CoV-2 PCR>> negative 4/29:Influenza PCR>> negative 5/1: Strep pneumo urinary antigen>>negative 5/1: Legionella urinary antigen>>negative 5/5: Tracheal aspirate> candida albicans 5/21: Blood culture x2>>3 out 4 bottles growing GNR BCID positive for klebsiella pneumoniae (+ESBL) 5/21: UCx> negative  Antimicrobials:  Ceftriaxone 5/22>>5/24 Meropenem 5/24>> Cefepime 5/21 x 1 dose Metronidazole 5/21 x 1 dose   OBJECTIVE   Blood pressure (!) 117/53, pulse 89, temperature 98.1 F (36.7 C), temperature source Oral, resp.  rate 16, height 4\' 11"  (1.499 m), weight 56.4 kg, SpO2 98 %.        Intake/Output Summary (Last 24 hours) at 10/11/2020 1329 Last data filed at 10/11/2020 0919 Gross per 24 hour  Intake 798.22 ml  Output --  Net 798.22 ml   Filed Weights   10/09/20 0453 10/10/20 0500 10/11/20 1239  Weight: 60.9 kg 57.1 kg 56.4 kg   Physical Examination: GENERAL:age appropriate 75 year-old patient, sitting in bed, no acute distress.  EYES: Pupils PERRLA. No scleral icterus. Extraocular muscles intact.  HEENT: Head atraumatic, normocephalic. Oropharynx and nasopharynx clear.  NECK:  Supple, no jugular venous distention. No thyroid enlargement, no tenderness.  LUNGS: Clear breath sounds bilaterally, no wheezing, rales,rhonchi or crepitation. No use of accessory muscles of respiration.  CARDIOVASCULAR: Tachycardia, regular rhythm, S1, S2. No murmurs, rubs, or gallops. 2+ distal pulses ABDOMEN: Soft, nontender, nondistended. Bowel sounds present. No organomegaly or mass.  EXTREMITIES: No pedal edema, cyanosis, or clubbing.  NEUROLOGIC: Cranial nerves II through XII are intact. Muscle strength 5/5 in all extremities. Sensation intact. Gait not checked.  PSYCHIATRIC: The patient is alert and oriented x 4.  SKIN: Warm and dry. No obvious rash, lesion, or ulcer.     Labs   CBC: Recent Labs  Lab 10/07/20 0438 10/08/20 0519 10/09/20 0618 10/10/20 0628 10/11/20 0639  WBC 8.6 6.9 7.9 9.3 10.8*  NEUTROABS 4.9 3.4 4.1 5.3 6.5  HGB 9.6* 9.1* 10.0* 10.7* 11.2*  HCT 29.8* 28.3* 31.6* 33.9* 35.0*  MCV 93.4 94.6 95.2 95.8 94.6  PLT 258 249 298 364 433*    Basic Metabolic Panel: Recent Labs  Lab 10/05/20 0325 10/06/20 0413  10/07/20 0438 10/08/20 0519 10/09/20 0618 10/11/20 0639  NA 134* 139 139  140 140 142 139  K 4.1 4.3 3.7  3.7 3.5 4.3 4.2  CL 101 101 104  105 106 109 105  CO2 26 29 26  27 26 25 25   GLUCOSE 101* 117* 111*  111* 96 109* 105*  BUN 10 12 12  12 8 9 15   CREATININE 0.49  0.66 0.54  0.50 0.47 0.44 0.58  CALCIUM 8.2* 8.4* 8.3*  8.4* 8.3* 8.9 9.0  MG 2.1 2.3  --   --  2.3  --   PHOS 2.5 3.5 3.3  --   --   --    GFR: Estimated Creatinine Clearance: 46.5 mL/min (by C-G formula based on SCr of 0.58 mg/dL). Recent Labs  Lab 10/06/20 0413 10/07/20 0438 10/08/20 0519 10/09/20 0618 10/10/20 0628 10/11/20 0639  PROCALCITON 39.42 23.89 7.81  --   --   --   WBC 11.2* 8.6 6.9 7.9 9.3 10.8*    Liver Function Tests: Recent Labs  Lab 10/07/20 0438  ALBUMIN 2.3*   No results for input(s): LIPASE, AMYLASE in the last 168 hours. No results for input(s): AMMONIA in the last 168 hours.  ABG    Component Value Date/Time   PHART 7.48 (H) 10/05/2020 1000   PCO2ART 28 (L) 10/05/2020 1000   PO2ART 66 (L) 10/05/2020 1000   HCO3 20.9 10/05/2020 1000   TCO2 23 01/14/2009 0327   ACIDBASEDEF 1.6 10/05/2020 1000   O2SAT 94.2 10/05/2020 1000     Coagulation Profile: No results for input(s): INR, PROTIME in the last 168 hours.  Cardiac Enzymes: No results for input(s): CKTOTAL, CKMB, CKMBINDEX, TROPONINI in the last 168 hours.  HbA1C: Hgb A1c MFr Bld  Date/Time Value Ref Range Status  09/11/2020 04:37 AM 6.4 (H) 4.8 - 5.6 % Final    Comment:    (NOTE) Pre diabetes:          5.7%-6.4%  Diabetes:              >6.4%  Glycemic control for   <7.0% adults with diabetes   10/02/2019 12:28 PM 6.4 (H) 4.8 - 5.6 % Final    Comment:             Prediabetes: 5.7 - 6.4          Diabetes: >6.4          Glycemic control for adults with diabetes: <7.0     CBG: Recent Labs  Lab 10/10/20 2010 10/11/20 0025 10/11/20 0342 10/11/20 0832 10/11/20 1241  GLUCAP 124* 103* 108* 116* 130*    Allergies Allergies  Allergen Reactions  . Azithromycin Rash     Home Medications  Prior to Admission medications   Medication Sig Start Date End Date Taking? Authorizing Provider  acetaminophen (TYLENOL) 500 MG tablet Take 1,000 mg by mouth every 6 (six) hours as  needed for headache.   Yes [provider]  albuterol (PROVENTIL HFA;VENTOLIN HFA) 108 (90 Base) MCG/ACT inhaler Inhale 2 puffs into the lungs every 4 (four) hours as needed for wheezing or shortness of breath.   Yes [provider]  amoxicillin (AMOXIL) 500 MG tablet Take 500 mg by mouth 3 (three) times daily. 08/03/20  Yes [provider]  aspirin EC 81 MG tablet Take 81 mg by mouth at bedtime.    Yes [provider]  azelastine (ASTELIN) 0.1 % nasal spray USE 2 SPRAYS BID UNTIL DIRECTED TO STOP.  USE FOR RUNNY NOSE 08/14/17  Yes [provider]  Buprenorphine HCl-Naloxone HCl 8-2 MG FILM Place 0.5 Film under the tongue daily.   Yes [provider]  cetirizine (ZYRTEC) 10 MG tablet Take 1 tablet (10 mg total) by mouth daily. 03/11/18  Yes Alene Mires, Sahar M, PA-C  clopidogrel (PLAVIX) 75 MG tablet TAKE 1 TABLET(75 MG) BY MOUTH AT BEDTIME 09/16/20  Yes Jettie Booze, MD  dexamethasone (DECADRON) 6 MG tablet dexamethasone 6 mg tablet  TAKE 1 TABLET BY MOUTH EVERY DAY   Yes [provider]  doxycycline (VIBRAMYCIN) 100 MG capsule doxycycline hyclate 100 mg capsule  TAKE 1 CAPSULE BY MOUTH TWICE DAILY   Yes [provider]  fluticasone (FLONASE) 50 MCG/ACT nasal spray Place 1 spray into both nostrils daily.   Yes [provider]  furosemide (LASIX) 40 MG tablet Take 1 tablet (40 mg total) by mouth daily as needed (swelling). 01/16/20  Yes Jettie Booze, MD  ibuprofen (ADVIL,MOTRIN) 600 MG tablet Take 600 mg by mouth as needed for pain. 08/14/17  Yes [provider]  isosorbide mononitrate (IMDUR) 30 MG 24 hr tablet TAKE 1 TABLET(30 MG) BY MOUTH DAILY 09/16/20  Yes Jettie Booze, MD  levothyroxine (SYNTHROID) 88 MCG tablet Take 88 mcg by mouth every morning. 05/26/20  Yes [provider]  metoprolol tartrate (LOPRESSOR) 25 MG tablet TAKE 1 TABLET BY MOUTH TWICE DAILY 09/16/20  Yes Jettie Booze,  MD  mupirocin ointment (BACTROBAN) 2 % mupirocin 2 % topical ointment  APPLY TOPICALLY TO LOWER LEG THREE TIMES DAILY FOR 10 DAYS   Yes [provider]  nystatin cream (MYCOSTATIN) Apply topically 2 (two) times daily. 08/11/20  Yes [provider]  potassium chloride SA (KLOR-CON) 20 MEQ tablet Take 1 tablet (20 mEq total) by mouth daily as needed (Take with furosemide (lasix)). 01/16/20  Yes Jettie Booze, MD  predniSONE (DELTASONE) 20 MG tablet prednisone 20 mg tablet   Yes [provider]  rosuvastatin (CRESTOR) 20 MG tablet TAKE 1 TABLET(20 MG) BY MOUTH DAILY 09/16/20  Yes Jettie Booze, MD  TRELEGY ELLIPTA 100-62.5-25 MCG/INH AEPB Take 1 puff by mouth daily. 08/13/20  Yes [provider]  triamcinolone cream (KENALOG) 0.1 % Apply topically 2 (two) times daily. to affected area 08/11/20  Yes [provider]  metoprolol succinate (TOPROL-XL) 50 MG 24 hr tablet Take 1 tablet (50 mg total) by mouth daily. Take with or immediately following a meal. 09/29/20   Domenic Polite, MD  nitroGLYCERIN (NITROSTAT) 0.4 MG SL tablet Place 1 tablet (0.4 mg total) under the tongue every 5 (five) minutes as needed for chest pain. 03/12/18   Jettie Booze, MD  pantoprazole (PROTONIX) 40 MG tablet Take 1 tablet (40 mg total) by mouth daily. 09/29/20   Domenic Polite, MD  sacubitril-valsartan (ENTRESTO) 24-26 MG Take 1 tablet by mouth 2 (two) times daily. 09/28/20   Domenic Polite, MD   Scheduled Meds: . aspirin  81 mg Oral QHS  . buprenorphine-naloxone  2 tablet Sublingual Daily  . Chlorhexidine Gluconate Cloth  6 each Topical QHS  . clopidogrel  75 mg Oral QHS  . fluticasone  1 spray Each Nare Daily  . insulin aspart  0-15 Units Subcutaneous Q4H  . levothyroxine  88 mcg Oral Q0600  . loratadine  10 mg Oral Daily  . losartan  25 mg Oral Daily  . mouth rinse  15 mL Mouth Rinse BID  . metoprolol succinate  50 mg Oral BID  . mometasone-formoterol  2  puff Inhalation BID  . multivitamin with minerals  1 tablet Oral Daily  . pantoprazole  40 mg Oral Daily  . rosuvastatin  40 mg Oral QHS  . sodium chloride flush  3 mL Intravenous Q12H  . sodium chloride flush  3 mL Intravenous Q12H   Continuous Infusions: . sodium chloride 250 mL (10/11/20 1304)  . sodium chloride Stopped (10/06/20 1418)  . meropenem (MERREM) IV 1 g (10/11/20 1305)   PRN Meds:.acetaminophen **OR** acetaminophen, albuterol, artificial tears, docusate, guaiFENesin, ondansetron (ZOFRAN) IV, polyethylene glycol  Resolved Hospital Problem list     ASSESSMENT & PLAN   Septic shock     Due to Klebsiella pneumonia    - present on admission  Previous Klebsiella Pneumonia ~ treated (Completed 7 days of Cefepime/Ancef) -Monitor fever curve -Trend WBC's & Procalcitonin -Follow cultures as above -Blood cultures on 10/02/20 with klebsiella pneumoniae & Enterobacteriaceae  -ABX changed to Meropenem 10/05/20 -Urinalysis and urine culture on 5/21 negative (? Could be impacted by previous exposure to antibiotics) -CXR on 5/24 without new/worsening opacities, shows unchanged faint bibasilar opacities consistent with resolving pneumonia -MRI spine 10/04/20 negative for osteomyelitis discitis or septic arthritis within the cervical, thoracic, or lumbar spine. No epidural abscess or other collection. -ID following, appreciate input ~ will follow recommendations ~ recommends 7 days of Meropenem  Acute decompensated systolic congestive heart failure with EF 25%   - exacerbated by Klebsiella infection with pneumonia and sepsis   - cardiology on case - appreciate input-Dr Gollan                                   LVEF reduced to 25-30% Catheterization has been completed, no intervention needed -Metoprolol as blood pressure tolerates above Other cardiomyopathy medications on hold for low blood pressure including Entresto  -Once recovered from sepsis could consider adding  Farxiga/Jardiance    -   Sinus tachycardia    Cardiology on case - appreciate input  -Continuous cardiac monitoring -Maintain MAP >49    Acute metabolic/toxic encephalopathy ~ resolved  -Continue outpatient Suboxone due to narcotic abuse hx        Best practice (right click and "Reselect all SmartList Selections" daily)   Diet: DYS 3 Pain/Anxiety/Delirium protocol: not indicated  VAP protocol: not indicated  DVT prophylaxis: SCD and subq lovenox  GI prophylaxis: PPI Glucose control:  SSI Yes Central venous access: n/a Arterial line:  N/A Foley: N/A Mobility: OOB  PT consulted: yes Code Status:  full code Disposition: med surg      Ottie Glazier, M.D.  Pulmonary & [place]

## 2020-10-11 NOTE — Progress Notes (Signed)
PROGRESS NOTE    Kathryn Garrett  RJJ:[id number] DOB: 1946/04/15 DOA: 09/10/2020 PCP: Imagene Riches, NP   Brief Narrative:  74/F presented to the [facility] ED from home with complaints of shortness of breath -Admitted on 4/29 with hypoxic respiratory failure secondary to COPD exacerbation and community-acquired pneumonia [facility] course was complicated by flash pulmonary edema and NSTEMI requiring mechanical ventilation, intubated on 4/30  Post extubation patient continued to slowly improve.  Initially was slated for CIR however unfortunately insurance denied.  Currently bed search initiated for skilled nursing facility.  Patient remains medically stable for discharge.  Received notification of case management that SNF had been denied by patient's insurance.  Appeal currently in progress.  5/22: Patient had acute onset of tachyarrhythmia yesterday.  Responded to bedside.  Ventricular rates approximately 60.  Unclear etiology as morphology.  Sinus.  Initially started amiodarone infusion however patient then dropped her blood pressure and became febrile.  Was transferred to the ICU overnight due to declining blood pressures despite aggressive fluid resuscitation.  Patient now in ICU.  Evidence of septic shock secondary to Klebsiella bacteremia.  On IV cefepime.  Mentating clearly.  Unclear source of sepsis.   5/23: Dynamics improving.  The exact etiology of the Klebsiella bacteremia is unclear.  Urinalysis did not grow any bacteria.  Case has been discussed with PCCM.  The Klebsiella pneumonia is the same bacteria that was found on tracheal aspirate when patient was intubated.  This raises possibility of another infectious focus somewhere in the body.  Considering TEE and will pursue MRI total spine to rule out spinal abscess.  5/24: Notified by pharmacy that Klebsiella isolated from the blood is demonstrating resistance gene.  Antibiotic switched to meropenem.  Before new antibiotic to be started  patient's clinical condition deteriorated.  She became acutely tachycardic, febrile, tachypneic.  PCCM reengaged for recommendations.  Assessment & Plan:   Principal Problem:   Acute exacerbation of chronic obstructive pulmonary disease (COPD) (HCC) Active Problems:   CAP (community acquired pneumonia)   CAD in native artery   HTN (hypertension)   Tobacco abuse   Hyperlipidemia LDL goal <70   Severe sepsis (HCC)   NSTEMI (non-ST elevated myocardial infarction) (HCC)   Hyperglycemia   SOB (shortness of breath)   Acute respiratory failure (HCC)   HFrEF (heart failure with reduced ejection fraction) (HCC)   Acute on chronic HFrEF (heart failure with reduced ejection fraction) (HCC)   SVT (supraventricular tachycardia) (HCC)   Acute combined systolic and diastolic heart failure (HCC)   Pure hypercholesterolemia  Septic shock Secondary to ESBL Klebsiella bacteremia Unclear source of bacteremia Urinalysis clean MRI total spine negative for abscess Was seemingly responding to IV ceftriaxone/cefepime Deteriorated on 5/24 Placed back on Levophed PCCM/ID following No indication for TEE at this point Plan: Levophed has been weaned off since 5/26 Midodrine has been weaned off Continue meropenem with pharmacy dosing for total of 7 days No fevers and blood pressure stabiling Still has tachycardia with exertion   Acute hypoxic respiratory failure  Acute COPD Exacerbation/left lung multifocal pneumonia Flash pulmonary edema on 4/30 -Intubated for flash pulmonary edema, extubated 5/7 -Treated with diuretics as well, now off -Improved and stable, weaned off O2 -Discharge planning, CIR declined -Now bed search initiated for skilled nursing facility -[facility] aware and is following for placement options -Patient has had a complicated hospital course and is significantly debilitated. She does have recovery potential and would benefit from standard short-term rehab.  Per case management appeal  process has been initiated. -Appeal has been upheld by [employer].  Will pursue skilled nursing facility at time of discharge.  Disposition plan pending  Sepsis, left lower lobe pneumonia Dysphagia -tracheal aspirate on 4/30 with KLEBSIELLA PNEUMONIAE (resistant to Ampicillin) -Completed 7 days of antibiotics. -Improving -SLP following, continues to have dysphagia, currently on dysphagia 3, honey thick liquids -Will need close SLP FU at rehab to monitor for continued improvement and changes to diet -Currently on honey thick liquids. Feels that po intake is improving -will likely need SLP to follow up swallowing as outpatient. Repeat MBSS could be repeated in the hospital, but cannot be done until the end of week, if patient is still admitted  Acute systolic congestive heart failure/Flash pulmonary edema NSTEMI Paroxysmal SVT -Echo noted with drop in EF down to 25-30%  -Cardiology consulted, left heart cath noted nonobstructive coronary disease, recommended medical management and repeat echo in 2 to 3 months -Continueaspirin, Plavix. Metoprolol dose being adjusted as tolerated, started on losartan -Follow-up with Dr. Irish Lack in 2 to 3 weeks  Acute metabolic/toxic encephalopathy -ICU stay complicated by severe encephalopathy while on vent -At baseline on buprenorphine/naloxone which was held on admission, now resumed -Also had an MRI brain which was unremarkable -Improved and stable now, PT OT as tolerated   COPD Ongoing tobacco abuse -Counseled, tapered off prednisone, continue duo nebs   DVT prophylaxis: SQ Lovenox Code Status: Full Family Communication: discussed with daughter at the bedside Disposition Plan: Status is: Inpatient  Remains inpatient appropriate because:Inpatient level of care appropriate due to severity of illness   Dispo: The patient is from: Home              Anticipated d/c is to: SNF              Patient currently is not medically stable to d/c.    Difficult to place patient No   Level ofcare: Progressive Cardiac  Consultants:   Palliative care  PCCM  Cardiology  ID  Procedures:   None  Antimicrobials:   Meropenem 5/24>   Subjective: Patient says she feels a little shaky and tremulous today. No shortness of breath or cough  Objective: Vitals:   10/10/20 2046 10/11/20 0340 10/11/20 0829 10/11/20 1239  BP: 118/80 (!) 116/52 (!) 126/50 (!) 117/53  Pulse: 93 88 88 89  Resp: 18 17 18 16   Temp: 98.6 F (37 C) 98.6 F (37 C) 97.6 F (36.4 C) 98.1 F (36.7 C)  TempSrc:   Oral Oral  SpO2: 95% 95% 96% 98%  Weight:    56.4 kg  Height:        Intake/Output Summary (Last 24 hours) at 10/11/2020 1453 Last data filed at 10/11/2020 0919 Gross per 24 hour  Intake 798.22 ml  Output --  Net 798.22 ml   Filed Weights   10/09/20 0453 10/10/20 0500 10/11/20 1239  Weight: 60.9 kg 57.1 kg 56.4 kg    Examination:  General exam: Alert, awake, oriented x 3 Respiratory system: Clear to auscultation. Respiratory effort normal. Cardiovascular system:RRR. No murmurs, rubs, gallops. Gastrointestinal system: Abdomen is nondistended, soft and nontender. No organomegaly or masses felt. Normal bowel sounds heard. Central nervous system: Alert and oriented. No focal neurological deficits. Extremities: No C/C/E, +pedal pulses Skin: No rashes, lesions or ulcers Psychiatry: Judgement and insight appear normal. Mood & affect appropriate.      Data Reviewed: I have personally reviewed following labs and imaging studies  CBC: Recent Labs  Lab 10/07/20 0438 10/08/20  5361 10/09/20 0618 10/10/20 0628 10/11/20 0639  WBC 8.6 6.9 7.9 9.3 10.8*  NEUTROABS 4.9 3.4 4.1 5.3 6.5  HGB 9.6* 9.1* 10.0* 10.7* 11.2*  HCT 29.8* 28.3* 31.6* 33.9* 35.0*  MCV 93.4 94.6 95.2 95.8 94.6  PLT 258 249 298 364 443*   Basic Metabolic Panel: Recent Labs  Lab 10/05/20 0325 10/06/20 0413 10/07/20 0438 10/08/20 0519 10/09/20 0618  10/11/20 0639  NA 134* 139 139  140 140 142 139  K 4.1 4.3 3.7  3.7 3.5 4.3 4.2  CL 101 101 104  105 106 109 105  CO2 26 29 26  27 26 25 25   GLUCOSE 101* 117* 111*  111* 96 109* 105*  BUN 10 12 12  12 8 9 15   CREATININE 0.49 0.66 0.54  0.50 0.47 0.44 0.58  CALCIUM 8.2* 8.4* 8.3*  8.4* 8.3* 8.9 9.0  MG 2.1 2.3  --   --  2.3  --   PHOS 2.5 3.5 3.3  --   --   --    GFR: Estimated Creatinine Clearance: 46.5 mL/min (by C-G formula based on SCr of 0.58 mg/dL). Liver Function Tests: Recent Labs  Lab 10/07/20 0438  ALBUMIN 2.3*   No results for input(s): LIPASE, AMYLASE in the last 168 hours. No results for input(s): AMMONIA in the last 168 hours. Coagulation Profile: No results for input(s): INR, PROTIME in the last 168 hours. Cardiac Enzymes: No results for input(s): CKTOTAL, CKMB, CKMBINDEX, TROPONINI in the last 168 hours. BNP (last 3 results) No results for input(s): PROBNP in the last 8760 hours. HbA1C: No results for input(s): HGBA1C in the last 72 hours. CBG: Recent Labs  Lab 10/10/20 2010 10/11/20 0025 10/11/20 0342 10/11/20 0832 10/11/20 1241  GLUCAP 124* 103* 108* 116* 130*   Lipid Profile: No results for input(s): CHOL, HDL, LDLCALC, TRIG, CHOLHDL, LDLDIRECT in the last 72 hours. Thyroid Function Tests: No results for input(s): TSH, T4TOTAL, FREET4, T3FREE, THYROIDAB in the last 72 hours. Anemia Panel: No results for input(s): VITAMINB12, FOLATE, FERRITIN, TIBC, IRON, RETICCTPCT in the last 72 hours. Sepsis Labs: Recent Labs  Lab 10/06/20 0413 10/07/20 0438 10/08/20 0519  PROCALCITON 39.42 23.89 7.81    Recent Results (from the past 240 hour(s))  Culture, blood (x 2)     Status: Abnormal   Collection Time: 10/02/20  6:24 PM   Specimen: BLOOD  Result Value Ref Range Status   Specimen Description   Final    BLOOD BLOOD RIGHT HAND Performed at [facility], [address] [place], [place] [address]    Special Requests   Final     BOTTLES DRAWN AEROBIC AND ANAEROBIC Blood Culture adequate volume Performed at [facility], [address], [place], [place] [address]    Culture  Setup Time   Final    GRAM NEGATIVE RODS IN BOTH AEROBIC AND ANAEROBIC BOTTLES CRITICAL RESULT CALLED TO, READ BACK BY AND VERIFIED WITH: NATHAN BELUE AT [facility] 10/03/2020 [place]. Performed at [facility], [place] [address], [place], [place] [address]    Culture (A)  Final    KLEBSIELLA PNEUMONIAE Confirmed Extended Spectrum Beta-Lactamase Producer (ESBL).  In bloodstream infections from ESBL organisms, carbapenems are preferred over piperacillin/tazobactam. They are shown to have a lower risk of mortality.    Report Status 10/05/2020 FINAL  Final   Organism ID, Bacteria KLEBSIELLA PNEUMONIAE  Final      Susceptibility   Klebsiella pneumoniae - MIC*    AMPICILLIN >=  32 RESISTANT Resistant     CEFAZOLIN >=64 RESISTANT Resistant     CEFEPIME 8 INTERMEDIATE Intermediate     CEFTAZIDIME >=64 RESISTANT Resistant     CEFTRIAXONE >=64 RESISTANT Resistant     CIPROFLOXACIN <=0.25 SENSITIVE Sensitive     GENTAMICIN <=1 SENSITIVE Sensitive     IMIPENEM <=0.25 SENSITIVE Sensitive     TRIMETH/SULFA <=20 SENSITIVE Sensitive     AMPICILLIN/SULBACTAM >=32 RESISTANT Resistant     PIP/TAZO >=128 RESISTANT Resistant     * KLEBSIELLA PNEUMONIAE  Blood Culture ID Panel (Reflexed)     Status: Abnormal   Collection Time: 10/02/20  6:24 PM  Result Value Ref Range Status   Enterococcus faecalis NOT DETECTED NOT DETECTED Final   Enterococcus Faecium NOT DETECTED NOT DETECTED Final   Listeria monocytogenes NOT DETECTED NOT DETECTED Final   Staphylococcus species NOT DETECTED NOT DETECTED Final   Staphylococcus aureus (BCID) NOT DETECTED NOT DETECTED Final   Staphylococcus epidermidis NOT DETECTED NOT DETECTED Final   Staphylococcus lugdunensis NOT DETECTED NOT DETECTED Final   Streptococcus species NOT DETECTED NOT DETECTED Final   Streptococcus  agalactiae NOT DETECTED NOT DETECTED Final   Streptococcus pneumoniae NOT DETECTED NOT DETECTED Final   Streptococcus pyogenes NOT DETECTED NOT DETECTED Final   A.calcoaceticus-baumannii NOT DETECTED NOT DETECTED Final   Bacteroides fragilis NOT DETECTED NOT DETECTED Final   Enterobacterales DETECTED (A) NOT DETECTED Final    Comment: Enterobacterales represent a large order of gram negative bacteria, not a single organism. CRITICAL RESULT CALLED TO, READ BACK BY AND VERIFIED WITH: NATHAN BELUE AT 0708 ON 10/03/2020 [place].    Enterobacter cloacae complex NOT DETECTED NOT DETECTED Final   Escherichia coli NOT DETECTED NOT DETECTED Final   Klebsiella aerogenes NOT DETECTED NOT DETECTED Final   Klebsiella oxytoca NOT DETECTED NOT DETECTED Final   Klebsiella pneumoniae DETECTED (A) NOT DETECTED Corrected    Comment: CORRECTED ON 05/22 AT 0825: PREVIOUSLY REPORTED AS DETECTED CRITICAL RESULT CALLED TO, READ BACK BY AND VERIFIED WITH: NATHAN BELUE AT 0708 ON 10/03/2020 [place].   Proteus species NOT DETECTED NOT DETECTED Final   Salmonella species NOT DETECTED NOT DETECTED Final   Serratia marcescens NOT DETECTED NOT DETECTED Final   Haemophilus influenzae NOT DETECTED NOT DETECTED Final   Neisseria meningitidis NOT DETECTED NOT DETECTED Final   Pseudomonas aeruginosa NOT DETECTED NOT DETECTED Final   Stenotrophomonas maltophilia NOT DETECTED NOT DETECTED Final   Candida albicans NOT DETECTED NOT DETECTED Final   Candida auris NOT DETECTED NOT DETECTED Final   Candida glabrata NOT DETECTED NOT DETECTED Final   Candida krusei NOT DETECTED NOT DETECTED Final   Candida parapsilosis NOT DETECTED NOT DETECTED Final   Candida tropicalis NOT DETECTED NOT DETECTED Final   Cryptococcus neoformans/gattii NOT DETECTED NOT DETECTED Final   CTX-M ESBL NOT DETECTED NOT DETECTED Final   Carbapenem resistance IMP NOT DETECTED NOT DETECTED Final   Carbapenem resistance KPC NOT DETECTED NOT DETECTED Final    Carbapenem resistance NDM NOT DETECTED NOT DETECTED Final   Carbapenem resist OXA 48 LIKE NOT DETECTED NOT DETECTED Final   Carbapenem resistance VIM NOT DETECTED NOT DETECTED Final    Comment: Performed at [facility], [address] [place], [place] [address]  Culture, blood (x 2)     Status: Abnormal   Collection Time: 10/02/20  6:32 PM   Specimen: BLOOD  Result Value Ref Range Status   Specimen Description   Final    BLOOD RIGHT ANTECUBITAL Performed  at [facility], [address] [place], [place] [address]    Special Requests   Final    BOTTLES DRAWN AEROBIC AND ANAEROBIC Blood Culture results may not be optimal due to an excessive volume of blood received in culture bottles Performed at [facility], [address] [place], [place] [address]    Culture  Setup Time   Final    GRAM NEGATIVE RODS IN BOTH AEROBIC AND ANAEROBIC BOTTLES CRITICAL VALUE NOTED.  VALUE IS CONSISTENT WITH PREVIOUSLY REPORTED AND CALLED VALUE. Performed at [facility], [address] [place], [place] [address]    Culture (A)  Final    KLEBSIELLA PNEUMONIAE SUSCEPTIBILITIES PERFORMED ON PREVIOUS CULTURE WITHIN THE LAST 5 DAYS. Performed at [facility], [place] [address] [place], [place] [address]    Report Status 10/05/2020 FINAL  Final  Culture, Urine     Status: None   Collection Time: 10/03/20  8:52 AM   Specimen: Urine, Clean Catch  Result Value Ref Range Status   Specimen Description   Final    URINE, CLEAN CATCH Performed at [facility], [address], [place], [place] [address]    Special Requests   Final    NONE Performed at [facility], [address] [place], [place] [address]    Culture   Final    NO GROWTH Performed at [facility], [place] [address] [place], [place] [address]    Report Status 10/04/2020 FINAL  Final         Radiology Studies: No results found.      Scheduled Meds: . aspirin   81 mg Oral QHS  . buprenorphine-naloxone  2 tablet Sublingual Daily  . Chlorhexidine Gluconate Cloth  6 each Topical QHS  . clopidogrel  75 mg Oral QHS  . fluticasone  1 spray Each Nare Daily  . insulin aspart  0-15 Units Subcutaneous Q4H  . levothyroxine  88 mcg Oral Q0600  . loratadine  10 mg Oral Daily  . losartan  25 mg Oral Daily  . mouth rinse  15 mL Mouth Rinse BID  . metoprolol succinate  50 mg Oral BID  . mometasone-formoterol  2 puff Inhalation BID  . multivitamin with minerals  1 tablet Oral Daily  . pantoprazole  40 mg Oral Daily  . rosuvastatin  40 mg Oral QHS  . sodium chloride flush  3 mL Intravenous Q12H  . sodium chloride flush  3 mL Intravenous Q12H   Continuous Infusions: . sodium chloride 250 mL (10/11/20 1304)  . sodium chloride Stopped (10/06/20 1418)  . meropenem (MERREM) IV 1 g (10/11/20 1305)     LOS: 31 days    Time spent: 35 minutes    Kathie Dike, MD [facility]  If 7PM-7AM, please contact night-coverage 10/11/2020, 2:53 PM

## 2020-10-11 NOTE — Progress Notes (Signed)
Progress Note  Patient Name: Kathryn Garrett Date of Encounter: 10/11/2020  [facility] Cardiologist: Larae Grooms, MD   Subjective   Reports feeling well this morning, had a good day yesterday Each day feeling stronger Denies shortness of breath Discussed with nurses, they report tachycardia on ambulation but heart rate recovers quickly at rest Off oxygen   Inpatient Medications    Scheduled Meds: . aspirin  81 mg Oral QHS  . buprenorphine-naloxone  2 tablet Sublingual Daily  . Chlorhexidine Gluconate Cloth  6 each Topical QHS  . clopidogrel  75 mg Oral QHS  . fluticasone  1 spray Each Nare Daily  . insulin aspart  0-15 Units Subcutaneous Q4H  . levothyroxine  88 mcg Oral Q0600  . loratadine  10 mg Oral Daily  . losartan  25 mg Oral Daily  . mouth rinse  15 mL Mouth Rinse BID  . metoprolol succinate  50 mg Oral BID  . mometasone-formoterol  2 puff Inhalation BID  . multivitamin with minerals  1 tablet Oral Daily  . pantoprazole  40 mg Oral Daily  . rosuvastatin  40 mg Oral QHS  . sodium chloride flush  3 mL Intravenous Q12H  . sodium chloride flush  3 mL Intravenous Q12H   Continuous Infusions: . sodium chloride Stopped (09/24/20 1321)  . sodium chloride Stopped (10/06/20 1418)  . meropenem (MERREM) IV Stopped (10/11/20 0258)   PRN Meds: acetaminophen **OR** acetaminophen, albuterol, artificial tears, docusate, guaiFENesin, ondansetron (ZOFRAN) IV, polyethylene glycol   Vital Signs    Vitals:   10/10/20 1549 10/10/20 2046 10/11/20 0340 10/11/20 0829  BP: (!) 115/46 118/80 (!) 116/52 (!) 126/50  Pulse: 98 93 88 88  Resp: 18 18 17 18   Temp: 98.1 F (36.7 C) 98.6 F (37 C) 98.6 F (37 C) 97.6 F (36.4 C)  TempSrc: Oral   Oral  SpO2: 94% 95% 95% 96%  Weight:      Height:        Intake/Output Summary (Last 24 hours) at 10/11/2020 1118 Last data filed at 10/11/2020 0919 Gross per 24 hour  Intake 798.22 ml  Output --  Net 798.22 ml   Last 3  Weights 10/10/2020 10/09/2020 10/08/2020  Weight (lbs) 125 lb 14.1 oz 134 lb 4.8 oz 119 lb 0.8 oz  Weight (kg) 57.1 kg 60.918 kg 54 kg      Telemetry    Normal sinus rhythm:  reviewed personally by myself  ECG     - Personally Reviewed  Physical Exam   Constitutional:  oriented to person, place, and time. No distress.  HENT:  Head: Grossly normal Eyes:  no discharge. No scleral icterus.  Neck: No JVD, no carotid bruits  Cardiovascular: Regular rate and rhythm, no murmurs appreciated Pulmonary/Chest: Mildly decreased breath sounds throughout, scattered Rales Abdominal: Soft.  no distension.  no tenderness.  Musculoskeletal: Normal range of motion Neurological:  normal muscle tone. Coordination normal. No atrophy Skin: Skin warm and dry Psychiatric: normal affect, pleasant   Labs    High Sensitivity Troponin:   Recent Labs  Lab 09/11/20 1433 09/11/20 1611 09/11/20 2011  TROPONINIHS 2,265* 2,455* 2,317*      Chemistry Recent Labs  Lab 10/07/20 0438 10/08/20 0519 10/09/20 0618 10/11/20 0639  NA 139  140 140 142 139  K 3.7  3.7 3.5 4.3 4.2  CL 104  105 106 109 105  CO2 26  27 26 25 25   GLUCOSE 111*  111* 96 109* 105*  BUN 12  12 8 9 15   CREATININE 0.54  0.50 0.47 0.44 0.58  CALCIUM 8.3*  8.4* 8.3* 8.9 9.0  ALBUMIN 2.3*  --   --   --   GFRNONAA >60  >60 >60 >60 >60  ANIONGAP 9  8 8 8 9      Hematology Recent Labs  Lab 10/09/20 0618 10/10/20 0628 10/11/20 0639  WBC 7.9 9.3 10.8*  RBC 3.32* 3.54* 3.70*  HGB 10.0* 10.7* 11.2*  HCT 31.6* 33.9* 35.0*  MCV 95.2 95.8 94.6  MCH 30.1 30.2 30.3  MCHC 31.6 31.6 32.0  RDW 14.6 14.7 14.7  PLT 298 364 433*    BNPNo results for input(s): BNP, PROBNP in the last 168 hours.   DDimer  No results for input(s): DDIMER in the last 168 hours.   Radiology    No results found.  Cardiac Studies     Patient Profile     75 y.o. female with CAD status post RCA PCI in 2017 and in-stent restenosis  requiring PCI, VF arrest,, chronic systolic diastolic heart failure, COPD, ongoing tobacco abuse, hypertension, hyperlipidemia, COPD, and narcotic abuse admitted with COPD exacerbation and Klebsiella pneumonia as well as acute on chronic systolic and diastolic heart failure.  -- elevated troponin. Echo : EF 25 to 30% -- cardiogenic shock requiring milrinone for diuresis and hypoxic respiratory failure requiring intubation.  left and right heart cath on 5/13 ,  mild to moderate nonobstructive CAD and normal filling pressures.   SVT ,  managed with metoprolol.   Assessment & Plan    SVT/Sinus tachycardia -Paroxysmal,  Well-controlled past several days maintaining normal sinus rhythm Initially treated with amiodarone, since discontinued -Beta-blocker held in the setting of sepsis and hypotension but as blood pressure has recovered, metoprolol reinitiated Now escalated up to metoprolol succinate 50 twice daily and tolerating relatively well -Tachycardia with ambulation, consider checking ambulatory saturations  Bacteremia/sepsis with hypotension on pressors Enterobacter and Klebsiella pneumonia on blood cultures,  On broad-spectrum antibiotics Hypotension resolved, Will discontinue midodrine as blood pressure stable  CAD s/p PCI Elevated troponin - cardiac cath showed nonobstructive diseaseabove - continue ASA, Plavix, statin Tolerating metoprolol succinate 50 twice daily  Acute systolic and diastolic heart failure - LVEF reduced to 25-30% Catheterization has been completed, no intervention needed Sepsis, hypotension, now off pressors -Looking to wean midodrine over the next several days -Tolerating metoprolol succinate 50 twice daily, adequate rate control -Midodrine off as above, will add low-dose losartan 25 and monitor pressure next 24 hours Consider adding  Farxiga/Jardiance, possibly in the clinic or at the time of discharge   Discussed medication changes with her,  discussed with hospitalist service  Total encounter time more than 35 minutes  Greater than 50% was spent in counseling and coordination of care with the patient   For questions or updates, please contact [place] Please consult [email or web address] for contact info under        Signed, Ida Rogue, MD  10/11/2020, 11:18 AM

## 2020-10-11 NOTE — Progress Notes (Signed)
Occupational Therapy Treatment Patient Details Name: Kathryn Garrett MRN: [id number] DOB: 27-Sep-1945 Today's Date: 10/11/2020    History of present illness 75 y.o. female with history of CAD with VF arrest s/p PCI to the RCA in 2017 s/p PTCA to the RCA for ISR in 2018, chronic combined systolic and diastolic CHF, COPD with ongoing tobacco use, HTN, HLD, anemia, and narcotic abuse on Suboxone therapy admitted with acute hypoxic respiratory failure requiring mechanical ventilation (extubated 09/17/2020) secondary to COPD exacerbation Klebsiella PNA and acute on chronic combined CHF with transient ST elevation and echo demonstrating new LV dysfunction. Patient with recent transfer to ICU for blood pressure issues requiring pressor support   OT comments  Pt seen for OT tx this date to f/u re: safety with ADLs/ADL mobility. Pt resting in bed, but agreeable to getting OOB. Pt able to come to sitting with SUPV with use of bed rails. Pt demos G static sitting balance. OT engages pt in sitting g/h tasks with SETUP and UB bathing with MIN A. Pt requires CGA/MIN A for STS with RW and CGA for fxl mobility to [facility] ~5-6 steps. Pt requires MIN A/CGA for pivoting steps from commode towards recliner with RW as well as one cue for safe use of RW (hand placement). Pt's HR noted to hit 136bpm at it's peak with activity. RN notified. Pt left up to chair with all needs met and in reach. Continue to anticipate pt will require rehabilitation s/p hospitalization as she continues to demo weakness and decreased balance to safely perform ADLs herself.  Follow Up Recommendations  CIR    Equipment Recommendations  3 in 1 bedside commode;Tub/shower seat    Recommendations for Other Services Rehab consult    Precautions / Restrictions Precautions Precautions: Fall Restrictions Weight Bearing Restrictions: No       Mobility Bed Mobility Overal bed mobility: Needs Assistance Bed Mobility: Supine to Sit     Supine to  sit: Supervision;HOB elevated     General bed mobility comments: increased time    Transfers Overall transfer level: Needs assistance Equipment used: Rolling walker (2 wheeled) Transfers: Sit to/from Stand Sit to Stand: Min assist;Min guard         General transfer comment: increased time, one cue for hand placement, slight assist to extend hips into full stand    Balance Overall balance assessment: Needs assistance Sitting-balance support: Feet supported;Single extremity supported Sitting balance-Leahy Scale: Good Sitting balance - Comments: G static, F dynamic   Standing balance support: Bilateral upper extremity supported;During functional activity Standing balance-Leahy Scale: Fair Standing balance comment: requires UE support, some posterior sway requiring assist to sustain standing                           ADL either performed or assessed with clinical judgement   ADL Overall ADL's : Needs assistance/impaired     Grooming: Oral care;Applying deodorant;Set up;Sitting   Upper Body Bathing: Minimal assistance;Sitting Upper Body Bathing Details (indicate cue type and reason): seated EOB     Upper Body Dressing : Set up;Sitting Upper Body Dressing Details (indicate cue type and reason): to don/doff hospital gown                 Functional mobility during ADLs: Min guard;Minimal assistance;Rolling walker (to take 4-5 steps from EOB to [facility] then pivoting steps from [facility] to recliner. Pt requires only minimal posterior support/steaying initially, but then can finish remainder of steps w/  CGA only.)       Vision Baseline Vision/History: Wears glasses Wears Glasses: At all times Patient Visual Report: No change from baseline     Perception     Praxis      Cognition Arousal/Alertness: Awake/alert Behavior During Therapy: WFL for tasks assessed/performed Overall Cognitive Status: Within Functional Limits for tasks assessed                                           Exercises Other Exercises Other Exercises: OT engages pt in seated bathing/grooming/dressing tasks while EOB as well as toileting tasks with BSC.   Shoulder Instructions       General Comments      Pertinent Vitals/ Pain       Pain Assessment: No/denies pain  Home Living                                          Prior Functioning/Environment              Frequency  Min 3X/week        Progress Toward Goals  OT Goals(current goals can now be found in the care plan section)  Progress towards OT goals: Progressing toward goals  Acute Rehab OT Goals Patient Stated Goal: get better so I can get home OT Goal Formulation: With patient/family Time For Goal Achievement: 10/18/20 Potential to Achieve Goals: Good  Plan Discharge plan remains appropriate;Frequency remains appropriate    Co-evaluation                 AM-PAC OT "6 Clicks" Daily Activity     Outcome Measure   Help from another person eating meals?: A Little Help from another person taking care of personal grooming?: A Lot Help from another person toileting, which includes using toliet, bedpan, or urinal?: A Little Help from another person bathing (including washing, rinsing, drying)?: A Little Help from another person to put on and taking off regular upper body clothing?: A Little Help from another person to put on and taking off regular lower body clothing?: A Lot 6 Click Score: 16    End of Session Equipment Utilized During Treatment: Rolling walker  OT Visit Diagnosis: Unsteadiness on feet (R26.81);Muscle weakness (generalized) (M62.81);Other symptoms and signs involving cognitive function   Activity Tolerance Patient tolerated treatment well   Patient Left in chair;with call bell/phone within reach;with nursing/sitter in room   Nurse Communication Mobility status        Time: 8182-9937 OT Time Calculation (min): 27 min  Charges: OT  General Charges $OT Visit: 1 Visit OT Treatments $Self Care/Home Management : 8-22 mins $Therapeutic Activity: 8-22 mins  Gerrianne Scale, [place], OTR/L ascom [phone] 10/11/20, 4:49 PM

## 2020-10-12 DIAGNOSIS — I5021 Acute systolic (congestive) heart failure: Secondary | ICD-10-CM | POA: Diagnosis not present

## 2020-10-12 DIAGNOSIS — I248 Other forms of acute ischemic heart disease: Secondary | ICD-10-CM

## 2020-10-12 DIAGNOSIS — I471 Supraventricular tachycardia: Secondary | ICD-10-CM | POA: Diagnosis not present

## 2020-10-12 LAB — CBC WITH DIFFERENTIAL/PLATELET
Abs Immature Granulocytes: 0.14 10*3/uL — ABNORMAL HIGH (ref 0.00–0.07)
Basophils Absolute: 0.1 10*3/uL (ref 0.0–0.1)
Basophils Relative: 1 %
Eosinophils Absolute: 0.2 10*3/uL (ref 0.0–0.5)
Eosinophils Relative: 2 %
HCT: 33.4 % — ABNORMAL LOW (ref 36.0–46.0)
Hemoglobin: 10.7 g/dL — ABNORMAL LOW (ref 12.0–15.0)
Immature Granulocytes: 2 %
Lymphocytes Relative: 28 %
Lymphs Abs: 2.7 10*3/uL (ref 0.7–4.0)
MCH: 30.5 pg (ref 26.0–34.0)
MCHC: 32 g/dL (ref 30.0–36.0)
MCV: 95.2 fL (ref 80.0–100.0)
Monocytes Absolute: 0.9 10*3/uL (ref 0.1–1.0)
Monocytes Relative: 10 %
Neutro Abs: 5.6 10*3/uL (ref 1.7–7.7)
Neutrophils Relative %: 57 %
Platelets: 401 10*3/uL — ABNORMAL HIGH (ref 150–400)
RBC: 3.51 MIL/uL — ABNORMAL LOW (ref 3.87–5.11)
RDW: 15.2 % (ref 11.5–15.5)
WBC: 9.6 10*3/uL (ref 4.0–10.5)
nRBC: 0 % (ref 0.0–0.2)

## 2020-10-12 LAB — GLUCOSE, CAPILLARY
Glucose-Capillary: 107 mg/dL — ABNORMAL HIGH (ref 70–99)
Glucose-Capillary: 119 mg/dL — ABNORMAL HIGH (ref 70–99)
Glucose-Capillary: 123 mg/dL — ABNORMAL HIGH (ref 70–99)
Glucose-Capillary: 118 mg/dL — ABNORMAL HIGH (ref 70–99)

## 2020-10-12 LAB — BASIC METABOLIC PANEL
Anion gap: 9 (ref 5–15)
BUN: 18 mg/dL (ref 8–23)
CO2: 25 mmol/L (ref 22–32)
Calcium: 9.1 mg/dL (ref 8.9–10.3)
Chloride: 104 mmol/L (ref 98–111)
Creatinine, Ser: 0.48 mg/dL (ref 0.44–1.00)
GFR, Estimated: >60 mL/min (ref >60)
Glucose, Bld: 110 mg/dL — ABNORMAL HIGH (ref 70–99)
Potassium: 4.2 mmol/L (ref 3.5–5.1)
Sodium: 138 mmol/L (ref 135–145)

## 2020-10-12 MED ORDER — ROSUVASTATIN CALCIUM 40 MG PO TABS: 40.0000 mg | ORAL_TABLET | Freq: Every day | ORAL | 1 refills | Status: DC

## 2020-10-12 MED ORDER — METOPROLOL SUCCINATE ER 50 MG PO TB24: 50.0000 mg | ORAL_TABLET | Freq: Two times a day (BID) | ORAL | 1 refills | Status: DC

## 2020-10-12 MED ORDER — LOSARTAN POTASSIUM 25 MG PO TABS: 25.0000 mg | ORAL_TABLET | Freq: Every day | ORAL | 1 refills | Status: DC

## 2020-10-12 MED ORDER — EMPAGLIFLOZIN 10 MG PO TABS: 10.0000 mg | ORAL_TABLET | Freq: Every day | ORAL | 1 refills | Status: DC

## 2020-10-12 MED ORDER — STARCH (THICKENING) PO POWD: ORAL | 0 refills | Status: DC

## 2020-10-12 MED ORDER — EMPAGLIFLOZIN 10 MG PO TABS
10.0000 mg | ORAL_TABLET | Freq: Every day | ORAL | Status: DC
  Administered 2020-10-12 – 2020-10-13 (×2): 10 mg via ORAL
  Filled 2020-10-12 (×2): qty 1

## 2020-10-12 NOTE — Progress Notes (Signed)
NAME:  Kathryn Garrett, MRN:  [id number], DOB:  August 03, 1945, LOS: 20 ADMISSION DATE:  09/10/2020, INITIAL CONSULTATION DATE:  09/11/2020 REFERRING MD: Dr. Damita Dunnings, CHIEF COMPLAINT: SOB  Brief Patient Description  Kathryn Garrett is a 75F with CAD status post RCA PCI in 2017 and in-stent restenosis requiring PCI, VF arrest,, chronic systolic diastolic heart failure, COPD, ongoing tobacco abuse, hypertension, hyperlipidemia, COPD, and narcotic abuse admitted with COPD exacerbation and Klebsiella pneumonia as well as acute on chronic systolic and diastolic heart failure.  Hospital course was complicated by cardiogenic shock requiring milrinone for diuresis and hypoxic respiratory failure requiring intubation. She was treated with Cefepime/Ancef for aspiration. She underwent left and right heart cath on 5/13 and was found to have mild to moderate nonobstructive CAD and normal filling pressures. She also had SVT that was managed with metoprolol. Patient was extubated and transferred to the floor. On 5/21 she was noted to be hypotensive despite IVFs and in sinus tachycardia to the 150s.  Lacate was 2.4.  Blood cultures with GNRs.  WBC was 14.8.  No significant infection in urine. CXR was unremarkable.  She was treated with Cefepime for Klebsiella pneumonia and transferred to ICU.  SHe was started on levophed for septic shock and PCCM re-consulted to assist with management.  Pertinent  Medical History  CAD status post RCA PCI in 2017 and in-stent restenosis requiring PCI,  VF arrest,,  chronic systolic diastolic heart failure,  COPD,  ongoing tobacco abuse,  hypertension,  hyperlipidemia,  Chronic narcotic abuse    Significant Hospital Events: Including procedures, antibiotic start and stop dates in addition to other pertinent events   09/10/2020-patient admits admitted with hospitalist service for COPD exacerbation and left lower lobe pneumonia 09/11/2020-overnight patient had suspected flash pulmonary  edema, dyspnea followed by somnolence requiring emergent intubation and mechanical ventilation.  Admitted to ICU 09/12/20- patient failed SBT , she had blood tinged secretions fromETT and aggitation, family was at bedside and we agreed on giving her more time and try again.  09/14/20- Failed SBT (became hypoxic with sats mid 80's, increased WOB and assessory muscle use, HR 140-160's). Will diurese today and add Metoprolol.  Tracheal aspirate from 4/30 with KLEBSIELLA PNEUMONIAE (resistant to Ampicillin)   09/15/20- Failed SBT (increased WOB and assessory muscle use, RR 40's, HR 140-150's, Hypertensive); plan to add scheduled PO Klonopin and Oxycodone, Lasix 40 mg IV x1, consult Palliative Care 09/16/20- Worsening Leukocytosis up to 21.7, low grade fever overnight, increased secretions overnight, will repeat Tracheal aspirate, remove central line, add mucinex, plan for SBT 09/17/20-[name] passed SBT in AM and following commands still having some moderate secretions 09/18/20-  Extubated yesterday.  Failed swallow eval today.  09/20/20- Weaned to room air, Leukocytosis improving; Hemodynamically stable 09/27/20- patient is coughing up phlegm but clinically is improved, we discussed pneumonia and COPD with outpatient clinic follow up with pulmonology.  There is plan for post dc rehab 09/28/20- patient is improved and she is being optimized for CIR, pulmonary will sign off and can follow up on outpatient basis 09/29/20- family at bedside today.  We discussed barium swallow with aspiration.  09/30/20- patient had PT today she walked today without need of additional supplemental O2 therapy.  She generally has been walking ok but has had severe LE weakness here.  She ate lunch infront of me had cough after couple bites.  We disucssed aspiartion risk and her barium swallow.  10/01/20: PCCM signed off  10/02/20: Pt with hypotension despite aggressive fluid resuscitation  concerning for sepsis secondary to UTI requiring levophed  gtt and transfer to stepdown unit.  PCCM signed back on case to assist with management  10/03/20: Remains on Levophed 10/04/20: Weaning down Levophed, increase Midodrine to 10 mg TID; consult ID for Bacteremia  10/05/20: Levophed weaned off, MRI spine yesterday negative for osteomyelitis discitis or septic arthritis, no epidural abscess or other fluid collection. Klebsiella is +ESBL.  ABX changed to Meropenem. 10/06/20: Deteriorated yesterday (fever, tachycardic, yesterday), Klebsiella ESBL +, ABX changed to Meropenem, requiring low dose Levophed again (currently on 5 mcg), ID following 10/06/20: Slowly weaning Levophed (currently at 3 mcg), WBC & Procalcitonin improving  10/07/20- patient is clinically improved, she is speaking and is oriented x3. Optimizing medically, vitals stabalizing, Procalcitonin trending down appropriately, Wbc count wnl, bmp is much improved, residual anemia on CBC.  PT/OT today 10/09/20- patient is seen at bedside, resp status improved. Son Kathryn Garrett at bedside states mother looks better.  She is ready for physical therapy.  She ate her entire meal this am which is a big improvement.  CBC and BMP normal except for mild chronic anemia.   10/11/20- patient is further improved. She had normal BM, she is not coughing. She is on last day of Meropenem. She is on suboxone which is hindering her discharge. Reviewed care plan with Dr Roderic Palau.    10/12/20- Patient walked to bathroom and back on her own without oxygen on felt no dyspnea, she is thankful for care and excited for discharge.   No chest pain , very mild cough no need to tx at thim point.   Cultures:  4/29: SARS-CoV-2 PCR>> negative 4/29:Influenza PCR>> negative 5/1: Strep pneumo urinary antigen>>negative 5/1: Legionella urinary antigen>>negative 5/5: Tracheal aspirate> candida albicans 5/21: Blood culture x2>>3 out 4 bottles growing GNR BCID positive for klebsiella pneumoniae (+ESBL) 5/21: UCx> negative  Antimicrobials:   Ceftriaxone 5/22>>5/24 Meropenem 5/24>> Cefepime 5/21 x 1 dose Metronidazole 5/21 x 1 dose   OBJECTIVE   Blood pressure (!) 125/45, pulse 92, temperature 98 F (36.7 C), temperature source Oral, resp. rate 18, height 4\' 11"  (1.499 m), weight 56.4 kg, SpO2 97 %.        Intake/Output Summary (Last 24 hours) at 10/12/2020 0817 Last data filed at 10/11/2020 1747 Gross per 24 hour  Intake 1118.22 ml  Output --  Net 1118.22 ml   Filed Weights   10/09/20 0453 10/10/20 0500 10/11/20 1239  Weight: 60.9 kg 57.1 kg 56.4 kg   Physical Examination: GENERAL:age appropriate 75 year-old patient, sitting in bed, no acute distress.  EYES: Pupils PERRLA. No scleral icterus. Extraocular muscles intact.  HEENT: Head atraumatic, normocephalic. Oropharynx and nasopharynx clear.  NECK:  Supple, no jugular venous distention. No thyroid enlargement, no tenderness.  LUNGS: Clear breath sounds bilaterally, no wheezing, rales,rhonchi or crepitation. No use of accessory muscles of respiration.  CARDIOVASCULAR: Tachycardia, regular rhythm, S1, S2. No murmurs, rubs, or gallops. 2+ distal pulses ABDOMEN: Soft, nontender, nondistended. Bowel sounds present. No organomegaly or mass.  EXTREMITIES: No pedal edema, cyanosis, or clubbing.  NEUROLOGIC: Cranial nerves II through XII are intact. Muscle strength 5/5 in all extremities. Sensation intact. Gait not checked.  PSYCHIATRIC: The patient is alert and oriented x 4.  SKIN: Warm and dry. No obvious rash, lesion, or ulcer.     Labs   CBC: Recent Labs  Lab 10/08/20 0519 10/09/20 0618 10/10/20 0628 10/11/20 0639 10/12/20 0705  WBC 6.9 7.9 9.3 10.8* 9.6  NEUTROABS 3.4 4.1 5.3 6.5 5.6  HGB 9.1* 10.0* 10.7* 11.2* 10.7*  HCT 28.3* 31.6* 33.9* 35.0* 33.4*  MCV 94.6 95.2 95.8 94.6 95.2  PLT 249 298 364 433* 401*    Basic Metabolic Panel: Recent Labs  Lab 10/06/20 0413 10/07/20 0438 10/08/20 0519 10/09/20 0618 10/11/20 0639 10/12/20 0705  NA 139  139  140 140 142 139 138  K 4.3 3.7  3.7 3.5 4.3 4.2 4.2  CL 101 104  105 106 109 105 104  CO2 29 26  27 26 25 25 25   GLUCOSE 117* 111*  111* 96 109* 105* 110*  BUN 12 12  12 8 9 15 18   CREATININE 0.66 0.54  0.50 0.47 0.44 0.58 0.48  CALCIUM 8.4* 8.3*  8.4* 8.3* 8.9 9.0 9.1  MG 2.3  --   --  2.3  --   --   PHOS 3.5 3.3  --   --   --   --    GFR: Estimated Creatinine Clearance: 46.5 mL/min (by C-G formula based on SCr of 0.48 mg/dL). Recent Labs  Lab 10/06/20 0413 10/07/20 0438 10/08/20 0519 10/09/20 0618 10/10/20 0628 10/11/20 0639 10/12/20 0705  PROCALCITON 39.42 23.89 7.81  --   --   --   --   WBC 11.2* 8.6 6.9 7.9 9.3 10.8* 9.6    Liver Function Tests: Recent Labs  Lab 10/07/20 0438  ALBUMIN 2.3*   No results for input(s): LIPASE, AMYLASE in the last 168 hours. No results for input(s): AMMONIA in the last 168 hours.  ABG    Component Value Date/Time   PHART 7.48 (H) 10/05/2020 1000   PCO2ART 28 (L) 10/05/2020 1000   PO2ART 66 (L) 10/05/2020 1000   HCO3 20.9 10/05/2020 1000   TCO2 23 01/14/2009 0327   ACIDBASEDEF 1.6 10/05/2020 1000   O2SAT 94.2 10/05/2020 1000     Coagulation Profile: No results for input(s): INR, PROTIME in the last 168 hours.  Cardiac Enzymes: No results for input(s): CKTOTAL, CKMB, CKMBINDEX, TROPONINI in the last 168 hours.  HbA1C: Hgb A1c MFr Bld  Date/Time Value Ref Range Status  09/11/2020 04:37 AM 6.4 (H) 4.8 - 5.6 % Final    Comment:    (NOTE) Pre diabetes:          5.7%-6.4%  Diabetes:              >6.4%  Glycemic control for   <7.0% adults with diabetes   10/02/2019 12:28 PM 6.4 (H) 4.8 - 5.6 % Final    Comment:             Prediabetes: 5.7 - 6.4          Diabetes: >6.4          Glycemic control for adults with diabetes: <7.0     CBG: Recent Labs  Lab 10/11/20 1241 10/11/20 1659 10/11/20 2022 10/11/20 2324 10/12/20 0745  GLUCAP 130* 98 105* 137* 107*    Allergies Allergies  Allergen Reactions   . Azithromycin Rash     Home Medications  Prior to Admission medications   Medication Sig Start Date End Date Taking? Authorizing Provider  acetaminophen (TYLENOL) 500 MG tablet Take 1,000 mg by mouth every 6 (six) hours as needed for headache.   Yes [provider]  albuterol (PROVENTIL HFA;VENTOLIN HFA) 108 (90 Base) MCG/ACT inhaler Inhale 2 puffs into the lungs every 4 (four) hours as needed for wheezing or shortness of breath.   Yes [provider]  amoxicillin (AMOXIL) 500 MG  tablet Take 500 mg by mouth 3 (three) times daily. 08/03/20  Yes [provider]  aspirin EC 81 MG tablet Take 81 mg by mouth at bedtime.    Yes [provider]  azelastine (ASTELIN) 0.1 % nasal spray USE 2 SPRAYS BID UNTIL DIRECTED TO STOP. USE FOR RUNNY NOSE 08/14/17  Yes [provider]  Buprenorphine HCl-Naloxone HCl 8-2 MG FILM Place 0.5 Film under the tongue daily.   Yes [provider]  cetirizine (ZYRTEC) 10 MG tablet Take 1 tablet (10 mg total) by mouth daily. 03/11/18  Yes Alene Mires, Sahar M, PA-C  clopidogrel (PLAVIX) 75 MG tablet TAKE 1 TABLET(75 MG) BY MOUTH AT BEDTIME 09/16/20  Yes Jettie Booze, MD  dexamethasone (DECADRON) 6 MG tablet dexamethasone 6 mg tablet  TAKE 1 TABLET BY MOUTH EVERY DAY   Yes [provider]  doxycycline (VIBRAMYCIN) 100 MG capsule doxycycline hyclate 100 mg capsule  TAKE 1 CAPSULE BY MOUTH TWICE DAILY   Yes [provider]  fluticasone (FLONASE) 50 MCG/ACT nasal spray Place 1 spray into both nostrils daily.   Yes [provider]  furosemide (LASIX) 40 MG tablet Take 1 tablet (40 mg total) by mouth daily as needed (swelling). 01/16/20  Yes Jettie Booze, MD  ibuprofen (ADVIL,MOTRIN) 600 MG tablet Take 600 mg by mouth as needed for pain. 08/14/17  Yes [provider]  isosorbide mononitrate (IMDUR) 30 MG 24 hr tablet TAKE 1 TABLET(30 MG) BY MOUTH DAILY 09/16/20  Yes Jettie Booze, MD   levothyroxine (SYNTHROID) 88 MCG tablet Take 88 mcg by mouth every morning. 05/26/20  Yes [provider]  metoprolol tartrate (LOPRESSOR) 25 MG tablet TAKE 1 TABLET BY MOUTH TWICE DAILY 09/16/20  Yes Jettie Booze, MD  mupirocin ointment (BACTROBAN) 2 % mupirocin 2 % topical ointment  APPLY TOPICALLY TO LOWER LEG THREE TIMES DAILY FOR 10 DAYS   Yes [provider]  nystatin cream (MYCOSTATIN) Apply topically 2 (two) times daily. 08/11/20  Yes [provider]  potassium chloride SA (KLOR-CON) 20 MEQ tablet Take 1 tablet (20 mEq total) by mouth daily as needed (Take with furosemide (lasix)). 01/16/20  Yes Jettie Booze, MD  predniSONE (DELTASONE) 20 MG tablet prednisone 20 mg tablet   Yes [provider]  rosuvastatin (CRESTOR) 20 MG tablet TAKE 1 TABLET(20 MG) BY MOUTH DAILY 09/16/20  Yes Jettie Booze, MD  TRELEGY ELLIPTA 100-62.5-25 MCG/INH AEPB Take 1 puff by mouth daily. 08/13/20  Yes [provider]  triamcinolone cream (KENALOG) 0.1 % Apply topically 2 (two) times daily. to affected area 08/11/20  Yes [provider]  metoprolol succinate (TOPROL-XL) 50 MG 24 hr tablet Take 1 tablet (50 mg total) by mouth daily. Take with or immediately following a meal. 09/29/20   Domenic Polite, MD  nitroGLYCERIN (NITROSTAT) 0.4 MG SL tablet Place 1 tablet (0.4 mg total) under the tongue every 5 (five) minutes as needed for chest pain. 03/12/18   Jettie Booze, MD  pantoprazole (PROTONIX) 40 MG tablet Take 1 tablet (40 mg total) by mouth daily. 09/29/20   Domenic Polite, MD  sacubitril-valsartan (ENTRESTO) 24-26 MG Take 1 tablet by mouth 2 (two) times daily. 09/28/20   Domenic Polite, MD   Scheduled Meds: . aspirin  81 mg Oral QHS  . buprenorphine-naloxone  2 tablet Sublingual Daily  . Chlorhexidine Gluconate Cloth  6 each Topical QHS  . clopidogrel  75 mg Oral QHS  . fluticasone  1 spray  Each Nare Daily  . insulin aspart  0-15  Units Subcutaneous Q4H  . levothyroxine  88 mcg Oral Q0600  . loratadine  10 mg Oral Daily  . losartan  25 mg Oral Daily  . mouth rinse  15 mL Mouth Rinse BID  . metoprolol succinate  50 mg Oral BID  . mometasone-formoterol  2 puff Inhalation BID  . multivitamin with minerals  1 tablet Oral Daily  . pantoprazole  40 mg Oral Daily  . rosuvastatin  40 mg Oral QHS  . sodium chloride flush  3 mL Intravenous Q12H  . sodium chloride flush  3 mL Intravenous Q12H   Continuous Infusions: . sodium chloride 250 mL (10/11/20 1304)  . sodium chloride Stopped (10/06/20 1418)  . meropenem (MERREM) IV 1 g (10/12/20 0430)   PRN Meds:.acetaminophen **OR** acetaminophen, albuterol, artificial tears, docusate, guaiFENesin, ondansetron (ZOFRAN) IV, polyethylene glycol  Resolved Hospital Problem list     ASSESSMENT & PLAN   Septic shock -resolved     Due to Klebsiella pneumonia    - present on admission  -COMPLETED THERAPY  Acute decompensated systolic congestive heart failure with EF 25%-CHRONIC STABLE   - exacerbated by Klebsiella infection with pneumonia and sepsis   - cardiology on case - appreciate input-Dr Gollan                                   LVEF reduced to 25-30% Catheterization has been completed, no intervention needed    -   Sinus tachycardia-RESOLVED    Cardiology on case - appreciate input  -Continuous cardiac monitoring -Maintain MAP >51    Acute metabolic/toxic encephalopathy ~ resolved  -Continue outpatient Suboxone due to narcotic abuse hx        Best practice (right click and "Reselect all SmartList Selections" daily)   Diet: DYS 3 Pain/Anxiety/Delirium protocol: not indicated  VAP protocol: not indicated  DVT prophylaxis: SCD and subq lovenox  GI prophylaxis: PPI Glucose control:  SSI Yes Central venous access: n/a Arterial line:  N/A Foley: N/A Mobility: OOB  PT consulted: yes Code Status:  full code Disposition: med surg      Ottie Glazier, M.D.  [facility] [place]

## 2020-10-12 NOTE — Progress Notes (Signed)
  Speech Language Pathology Treatment: Dysphagia  Patient Details Name: Kathryn Garrett MRN: [id number] DOB: 02/26/1946 Today's Date: 10/12/2020 Time: 1027-2536 SLP Time Calculation (min) (ACUTE ONLY): 26 min  Assessment / Plan / Recommendation Clinical Impression  Education completed on diet consistency recommendations including Honey consistency liquids via Cup; aspiration precautions; swallowing strategies; ordering information for thickened liquids; Honey thick liquids and powered thickening agent for use at d/c; scheduling of appt for f/u MBSS Outpatient at next venue of care if d/c'ing tomorrow. All questions answered; no further concerns stated by pt/Dtr on phone. NSG updated.     HPI HPI: Pt is a 75 yo female admitted for COPD exacerbation and left lower lobe pneumonia. Tracheal aspirate from 4/30 with KLEBSIELLA PNEUMONIAE (resistant to Ampicillin). Head CT 09/17/2020 No acute intracranial abnormality.Past medical history including HRrEF, tobacco abuse, CAD s/p stenting, CHF, COPD who is admitted with COPD exacerbation / pneumonia / decompensated heart failure / NSTEMI.  Pt intubated 09/11/2020 thru 09/17/2020. Extubated then w/ NG placed for meds, po's.  MBSS completed on 09/27/2020 revealing pharyngeal phase dysphagia w/ SILENT Aspiration; also noted Esophageal phase Dysmotility w/ bolus stasis and retrograde backflow b/t trials.      SLP Plan  Continue with current plan of care       Recommendations  Diet recommendations: Dysphagia 3 (mechanical soft);Honey-thick liquid Liquids provided via: Cup;No straw Medication Administration: Whole meds with puree (for safer swallowing) Supervision: Patient able to self feed;Intermittent supervision to cue for compensatory strategies Compensations: Minimize environmental distractions;Slow rate;Small sips/bites;Lingual sweep for clearance of pocketing;Multiple dry swallows after each bite/sip;Clear throat intermittently;Chin tuck Postural  Changes and/or Swallow Maneuvers: Out of bed for meals;Seated upright 90 degrees;Upright 30-60 min after meal                General recommendations:  (Dietician f/u) Oral Care Recommendations: Oral care BID;Oral care prior to ice chip/H20;Patient independent with oral care Follow up Recommendations: Home health SLP (vs SNF) SLP Visit Diagnosis: Dysphagia, pharyngeal phase (R13.13);Dysphagia, pharyngoesophageal phase (R13.14) Plan: Continue with current plan of care       [place], [place], CCC-SLP Speech Language Pathologist Rehab Services [phone] [facility] 10/12/2020, 4:47 PM

## 2020-10-12 NOTE — TOC Initial Note (Addendum)
Transition of Care [facility]) - Initial/Assessment Note    Patient Details  Name: Kathryn Garrett MRN: [id number] Date of Birth: 02/26/1946  Transition of [facility]) CM/SW Contact:    Kathryn Sam, LCSW Phone Number: 10/12/2020, 12:15 PM  Clinical Narrative:                  Update 1:46 pm: Kathryn Garrett with [facility] has accepted patient for PT, OT, RN and aide. Will inform them when patient discharges.     CSW spoke with patient, patient's daughter Kathryn Garrett and patient's daughter Kathryn Garrett. They report they would not be interested in SNF bed in [facility] that can accept suboxone.  They reports they would like patient to be set up with home health services. No preference of agency. They report at discharge patient will be going to Amanda's home at: [address], [place]  Reports they are interested in [facility] PT, [place], [place] and aide.   They report they have a wheel chair and walker at home, would need 3in1 and tub bench at time of discharge.   CSW has sent out home health referral pending acceptance at this time.   Awaiting DME orders for CSW to follow up with [employer] for 3in1 and tub bench to be delivered to room.   Kathryn Garrett to transport patient home at discharge.   Family has questions about thickened liquid diet at dc, MD made aware will speak with family.  Expected Discharge Plan: [place] Barriers to Discharge: Continued Medical Work up   Patient Goals and CMS Choice Patient states their goals for this hospitalization and ongoing recovery are:: to go home CMS Medicare.gov Compare Post Acute Care list provided to:: Patient Choice offered to / list presented to : Patient  Expected Discharge Plan and Services Expected Discharge Plan: [place] In-house Referral: Clinical Social Work   Post Acute Care Choice: [place] arrangements for the past 2 months: [place]                 DME Arranged: 3-N-1,Tub bench DME  Agency: [employer] Date DME Agency Contacted: 10/12/20 Time DME Agency Contacted: 46 Representative spoke with at DME Agency: Kathryn Garrett HH Arranged: PT,OT,RN,Nurse's Aide [place]:  (TBD) Date HH Agency Contacted: 10/12/20 Time HH Agency Contacted: [phone]    Prior Living Arrangements/Services Living arrangements for the past 2 months: [place] with:: Self Patient language and need for interpreter reviewed:: Yes Do you feel safe going back to the place where you live?: Yes      Need for Family Participation in Patient Care: Yes (Comment) Care giver support system in place?: Yes (comment)   Criminal Activity/Legal Involvement Pertinent to Current Situation/Hospitalization: No - Comment as needed  Activities of Daily Living Home Assistive Devices/Equipment: Walker (specify type) ADL Screening (condition at time of admission) Patient's cognitive ability adequate to safely complete daily activities?: Yes Is the patient deaf or have difficulty hearing?: No Does the patient have difficulty seeing, even when wearing glasses/contacts?: No Does the patient have difficulty concentrating, remembering, or making decisions?: No Patient able to express need for assistance with ADLs?: Yes Does the patient have difficulty dressing or bathing?: No Independently performs ADLs?: Yes (appropriate for developmental age) Does the patient have difficulty walking or climbing stairs?: Yes Weakness of Legs: Both Weakness of Arms/Hands: None  Permission Sought/Granted Permission sought to share information with : Case Manager,Family Supports Permission granted to share information with :  Yes, Verbal Permission Granted  Share Information with NAME: Kathryn Garrett  Permission granted to share info w AGENCY: home health  Permission granted to share info w Relationship: daughter  Permission granted to share info w Contact Information: [phone]  Emotional Assessment Appearance:: Appears stated  age Attitude/Demeanor/Rapport: Gracious Affect (typically observed): Calm Orientation: : Oriented to Self,Oriented to Place,Oriented to  Time,Oriented to Situation Alcohol / Substance Use: Not Applicable Psych Involvement: No (comment)  Admission diagnosis:  Acute respiratory failure (HCC) [J96.00] Acute respiratory distress [R06.03] Acute exacerbation of chronic obstructive pulmonary disease (COPD) ([place]) [J44.1] Pneumonia of left lower lobe due to infectious organism [J18.9] Patient Active Problem List   Diagnosis Date Noted  . SVT (supraventricular tachycardia) ([place])   . Acute combined systolic and diastolic heart failure ([place])   . Pure hypercholesterolemia   . Acute on chronic HFrEF (heart failure with reduced ejection fraction) ([place])   . Acute respiratory failure ([place]) 09/11/2020  . HFrEF (heart failure with reduced ejection fraction) ([place])   . Acute exacerbation of chronic obstructive pulmonary disease (COPD) ([place]) 09/10/2020  . Severe sepsis ([place]) 09/10/2020  . NSTEMI (non-ST elevated myocardial infarction) ([place]) 09/10/2020  . Hyperglycemia 09/10/2020  . SOB (shortness of breath) 09/10/2020  . Chronic obstructive pulmonary disease ([place]) 03/05/2018  . History of opioid abuse ([place]) 03/05/2018  . Nocturnal hypoxemia 10/30/2017  . Peripheral arterial disease ([place]) 08/30/2016  . Chest pain 06/04/2016  . S/P PTCA (percutaneous transluminal coronary angioplasty) 02/10/16 to RCA lesion for in stent restenois 02/11/2016  . Unstable angina ([place])   . Hyperlipidemia LDL goal <70 01/28/2016  . Presence of coronary angioplasty implant and graft 10/15/2015  . Tobacco abuse 10/15/2015  . CAP (community acquired pneumonia) 09/24/2015  . CAD in native artery 09/24/2015  . HTN (hypertension) 09/24/2015  . Lung nodule, solitary 03/31/2014  . Liver nodule 03/31/2014   PCP:  Imagene Riches, NP Pharmacy:   [facility] [place], [place] - [address] [place] [address] Phone: [phone] Fax: [phone]  [id number] [address], [place] [address] [place] [address] Phone: [phone] Fax: [phone]     Social Determinants of Health (SDOH) Interventions    Readmission Risk Interventions No flowsheet data found.

## 2020-10-12 NOTE — Progress Notes (Signed)
Progress Note  Patient Name: Kathryn Garrett Date of Encounter: 10/12/2020  [facility] Cardiologist: Kathryn Grooms, MD   Subjective   Kathryn Garrett feels well, denying chest pain, shortness of breath, palpitations, lightheadedness, and edema.  Inpatient Medications    Scheduled Meds: . aspirin  81 mg Oral QHS  . buprenorphine-naloxone  2 tablet Sublingual Daily  . Chlorhexidine Gluconate Cloth  6 each Topical QHS  . clopidogrel  75 mg Oral QHS  . fluticasone  1 spray Each Nare Daily  . insulin aspart  0-15 Units Subcutaneous Q4H  . levothyroxine  88 mcg Oral Q0600  . loratadine  10 mg Oral Daily  . losartan  25 mg Oral Daily  . mouth rinse  15 mL Mouth Rinse BID  . metoprolol succinate  50 mg Oral BID  . mometasone-formoterol  2 puff Inhalation BID  . multivitamin with minerals  1 tablet Oral Daily  . pantoprazole  40 mg Oral Daily  . rosuvastatin  40 mg Oral QHS  . sodium chloride flush  3 mL Intravenous Q12H  . sodium chloride flush  3 mL Intravenous Q12H   Continuous Infusions: . sodium chloride 250 mL (10/11/20 1304)  . sodium chloride Stopped (10/06/20 1418)  . meropenem (MERREM) IV 1 g (10/12/20 0430)   PRN Meds: acetaminophen **OR** acetaminophen, albuterol, artificial tears, docusate, guaiFENesin, ondansetron (ZOFRAN) IV, polyethylene glycol   Vital Signs    Vitals:   10/11/20 1508 10/11/20 2021 10/12/20 0434 10/12/20 0745  BP: (!) 122/45 (!) 129/50 (!) 109/48 (!) 125/45  Pulse: 89 95 92 92  Resp: 19 12 16 18   Temp: 98.2 F (36.8 C) 97.9 F (36.6 C) 99.6 F (37.6 C) 98 F (36.7 C)  TempSrc: Oral   Oral  SpO2: 96% 100% 93% 97%  Weight:      Height:        Intake/Output Summary (Last 24 hours) at 10/12/2020 1058 Last data filed at 10/11/2020 1747 Gross per 24 hour  Intake 320 ml  Output --  Net 320 ml   Last 3 Weights 10/11/2020 10/10/2020 10/09/2020  Weight (lbs) 124 lb 4.8 oz 125 lb 14.1 oz 134 lb 4.8 oz  Weight (kg) 56.382 kg 57.1 kg  60.918 kg      Telemetry    Sinus rhythm with heart rates predominantly 80 to 110 bpm; brief spikes to 130 bpm noted. - Personally Reviewed  ECG    No new tracing  Physical Exam   GEN: No acute distress.   Neck: No JVD Cardiac: RRR, no murmurs, rubs, or gallops.  Respiratory:  Mildly diminished breath sounds throughout without wheezes or crackles GI: Soft, nontender, non-distended  MS: No edema; No deformity. Neuro:  Nonfocal  Psych: Normal affect   Labs    High Sensitivity Troponin:  No results for input(s): TROPONINIHS in the last 720 hours.    Chemistry Recent Labs  Lab 10/07/20 0438 10/08/20 0519 10/09/20 0618 10/11/20 0639 10/12/20 0705  NA 139  140   < > 142 139 138  K 3.7  3.7   < > 4.3 4.2 4.2  CL 104  105   < > 109 105 104  CO2 26  27   < > 25 25 25   GLUCOSE 111*  111*   < > 109* 105* 110*  BUN 12  12   < > 9 15 18   CREATININE 0.54  0.50   < > 0.44 0.58 0.48  CALCIUM 8.3*  8.4*   < >  8.9 9.0 9.1  ALBUMIN 2.3*  --   --   --   --   GFRNONAA >60  >60   < > >60 >60 >60  ANIONGAP 9  8   < > 8 9 9    < > = values in this interval not displayed.     Hematology Recent Labs  Lab 10/10/20 0628 10/11/20 0639 10/12/20 0705  WBC 9.3 10.8* 9.6  RBC 3.54* 3.70* 3.51*  HGB 10.7* 11.2* 10.7*  HCT 33.9* 35.0* 33.4*  MCV 95.8 94.6 95.2  MCH 30.2 30.3 30.5  MCHC 31.6 32.0 32.0  RDW 14.7 14.7 15.2  PLT 364 433* 401*    BNPNo results for input(s): BNP, PROBNP in the last 168 hours.   DDimer No results for input(s): DDIMER in the last 168 hours.   Radiology    No results found.  Cardiac Studies   L/RHC (09/24/2020): 1. Mild to moderate, non-obstructive coronary artery disease including 20-35% distal LMCA and 50% ostial LAD disease, similar to prior catheterization when FFR was not hemodynamically significant. 2. Patent proximal RCA stents with mild to moderate in-stent restenosis (30-40%). 3. Normal left and right heart filling  pressures. 4. Normal pulmonary artery pressures. 5. Normal cardiac output/index.  TTE (09/11/2020): 1. Recommend limited echo with iv contrast agent (definity) for addequate  assessment of LVEF and wall motion.. Left ventricular ejection fraction,  by estimation, is 25 to 30%. The left ventricle has severely decreased  function. Left ventricular  endocardial border not optimally defined to evaluate regional wall motion.  Left ventricular diastolic parameters are indeterminate.  2. Right ventricular systolic function is normal. The right ventricular  size is normal.  3. The mitral valve was not well visualized. Mild mitral valve  regurgitation.  4. The aortic valve was not well visualized. Aortic valve regurgitation  is not visualized.   Patient Profile     75 y.o. female with history of coronary artery disease status post multiple PCI's, chronic diastolic heart failure, COPD, hypertension, hyperlipidemia, COPD, admitted with COPD exacerbation and Klebsiella pneumonia complicated by acute HFrEF.  Assessment & Plan    Acute HFrEF: Kathryn Garrett appears euvolemic and is breathing comfortably.  Continue current regimen of metoprolol succinate 50 mg daily and losartan 25 mg daily.  Maintain net even fluid balance.  Defer adding spironolactone in the setting of soft blood pressure.  Will add empagliflozin 10 mg daily.  Repeat limited echo in 2 to 3 weeks to reassess LVEF.  Demand ischemia: Significant troponin bump noted during early stages of admission with subsequent cath showing patent RCA stents and no critical disease.  Medical management recommended.  Continue up to 12 months of DAPT with aspirin and clopidogrel.  Secondary prevention with rosuvastatin 40 mg daily.  PSVT and sinus tachycardia: Telemetry shows maintenance of sinus rhythm with some heart rate spikes up to 130 bpm, consistent with sinus tachycardia.  No significant atrial or ventricular arrhythmia observed  over the last 24 hours.  Continue metoprolol succinate 50 mg twice daily.  For questions or updates, please contact [place] Please consult [email or web address] for contact info under [facility].  Signed, Nelva Bush, MD  10/12/2020, 10:58 AM

## 2020-10-12 NOTE — Discharge Summary (Signed)
Physician Discharge Summary  Kathryn Garrett TJQ:[id number] DOB: [birth year] DOA: 09/10/2020  PCP: Imagene Riches, NP  Admit date: 09/10/2020 Discharge date: 10/13/2020  Admitted From: home Disposition:  home  Recommendations for Outpatient Follow-up:  1. Follow up with PCP in 1-2 weeks 2. Please obtain BMP/CBC in one week 3. Follow up with cardiologist, Dr. Irish Lack in 2 weeks, needs repeat echocardiogram 4. Patient plans to follow-up with speech therapy for repeat modified barium swallow  Home Health:HH PT/OT, RN, aide Equipment/Devices:  Discharge Condition: stable CODE STATUS:full code Diet recommendation: dysphagia 3 diet with honey thick liquids  Brief/Interim Summary: 75 year old female presented to the emergency room with complaints of shortness of breath.  She was admitted on 4/29 with acute hypoxic respiratory failure secondary to COPD exacerbation and pneumonia.  Hospital course was complicated by development of flash pulmonary edema and non-ST elevation MI requiring mechanical ventilation on 4/30.  Echocardiogram showed drop in ejection fraction at 30%.  She briefly required inotropes in the ICU.  Cardiology was consulted and she underwent left heart cath.  Recommendations were for medical management.  Hospital course further complicated by development of gram-negative bacteremia.  Seen by infectious disease and completed 7 days of meropenem.  Discharge Diagnoses:  Principal Problem:   Acute exacerbation of chronic obstructive pulmonary disease (COPD) ([place]) Active Problems:   CAP (community acquired pneumonia)   CAD in native artery   HTN (hypertension)   Tobacco abuse   Hyperlipidemia LDL goal <70   Severe sepsis (HCC)   NSTEMI (non-ST elevated myocardial infarction) (HCC)   Hyperglycemia   SOB (shortness of breath)   Acute respiratory failure (HCC)   HFrEF (heart failure with reduced ejection fraction) (HCC)   Acute on chronic HFrEF (heart failure with reduced  ejection fraction) (HCC)   SVT (supraventricular tachycardia) (HCC)   Acute combined systolic and diastolic heart failure (HCC)   Pure hypercholesterolemia  Septic shock Secondary to ESBL Klebsiella bacteremia Unclear source of bacteremia Urinalysis clean MRI total spine negative for abscess Was seemingly responding to IV ceftriaxone/cefepime Deteriorated on 5/24 Placed back on Levophed PCCM/ID following Subsequently transitioned to meropenem for 7 days with good effect Currently afebrile, hemodynamically stable, normal WBC count   Acute hypoxic respiratory failure  Acute COPD Exacerbation/left lung multifocal pneumonia Flash pulmonary edema on 4/30 -Intubated for flash pulmonary edema, extubated 5/7 -Treated with diuretics as well, now off -Improved and stable, weaned off O2 -Currently on beta-blockers  -We will follow-up with cardiology  Sepsis, left lower lobe pneumonia Dysphagia -tracheal aspirate on 4/30 with KLEBSIELLA PNEUMONIAE (resistant to Ampicillin) -Completed 7 days of antibiotics. -SLP following,continues to have dysphagia, currently on dysphagia 3, honey thick liquids -She plans to follow-up with speech therapy as an outpatient for repeat MBSS -Currently on honey thick liquids. Feels that po intake is improving   Acute systolic congestive heart failure/Flash pulmonary edema NSTEMI Paroxysmal SVT -Echo noted with drop in EF down to 25-30%  -Cardiology consulted, left heart cath noted nonobstructive coronary disease, recommended medical management and repeat echo in 2 to 3 months -Continueaspirin, Plavix. Metoprolol dose being adjusted as tolerated, started on losartan -She is also on statin -Follow-up with Dr. Irish Lack in 2 to 3 weeks for repeat echocardiogram  Acute metabolic/toxic encephalopathy -ICU stay complicated by severe encephalopathy while on vent -At baseline on buprenorphine/naloxone which was held on admission, now resumed -Also had an  MRI brain which was unremarkable -Improved and stable now, PT OT as tolerated   COPD Ongoing tobacco abuse -  Counseled, taperedoff prednisone, continue duo nebs  Discharge Instructions   Allergies as of 10/12/2020      Reactions   Azithromycin Rash      Medication List    STOP taking these medications   amoxicillin 500 MG tablet Commonly known as: AMOXIL   dexamethasone 6 MG tablet Commonly known as: DECADRON   doxycycline 100 MG capsule Commonly known as: VIBRAMYCIN   furosemide 40 MG tablet Commonly known as: LASIX   ibuprofen 600 MG tablet Commonly known as: ADVIL   isosorbide mononitrate 30 MG 24 hr tablet Commonly known as: IMDUR   metoprolol tartrate 25 MG tablet Commonly known as: LOPRESSOR   mupirocin ointment 2 % Commonly known as: BACTROBAN   nitroGLYCERIN 0.4 MG SL tablet Commonly known as: NITROSTAT   nystatin cream Commonly known as: MYCOSTATIN   potassium chloride SA 20 MEQ tablet Commonly known as: KLOR-CON   predniSONE 20 MG tablet Commonly known as: DELTASONE     TAKE these medications   acetaminophen 500 MG tablet Commonly known as: TYLENOL Take 1,000 mg by mouth every 6 (six) hours as needed for headache.   albuterol 108 (90 Base) MCG/ACT inhaler Commonly known as: VENTOLIN HFA Inhale 2 puffs into the lungs every 4 (four) hours as needed for wheezing or shortness of breath.   aspirin EC 81 MG tablet Take 81 mg by mouth at bedtime.   azelastine 0.1 % nasal spray Commonly known as: ASTELIN USE 2 SPRAYS BID UNTIL DIRECTED TO STOP. USE FOR RUNNY NOSE   Buprenorphine HCl-Naloxone HCl 8-2 MG Film Place 0.5 Film under the tongue daily.   cetirizine 10 MG tablet Commonly known as: ZYRTEC Take 1 tablet (10 mg total) by mouth daily.   clopidogrel 75 MG tablet Commonly known as: PLAVIX TAKE 1 TABLET(75 MG) BY MOUTH AT BEDTIME   empagliflozin 10 MG Tabs tablet Commonly known as: JARDIANCE Take 1 tablet (10 mg total) by mouth  daily. Start taking on: October 13, 2020   fluticasone 50 MCG/ACT nasal spray Commonly known as: FLONASE Place 1 spray into both nostrils daily.   food thickener Powd Commonly known as: THICK IT Use as needed for honey thick liquids   levothyroxine 88 MCG tablet Commonly known as: SYNTHROID Take 88 mcg by mouth every morning.   losartan 25 MG tablet Commonly known as: COZAAR Take 1 tablet (25 mg total) by mouth daily. Start taking on: October 13, 2020   metoprolol succinate 50 MG 24 hr tablet Commonly known as: TOPROL-XL Take 1 tablet (50 mg total) by mouth 2 (two) times daily. Take with or immediately following a meal.   pantoprazole 40 MG tablet Commonly known as: PROTONIX Take 1 tablet (40 mg total) by mouth daily.   rosuvastatin 40 MG tablet Commonly known as: CRESTOR Take 1 tablet (40 mg total) by mouth at bedtime. What changed:   medication strength  See the new instructions.   Trelegy Ellipta 100-62.5-25 MCG/INH Aepb Generic drug: Fluticasone-Umeclidin-Vilant Take 1 puff by mouth daily.   triamcinolone cream 0.1 % Commonly known as: KENALOG Apply topically 2 (two) times daily. to affected area            [place]  (From admission, onward)         Start     Ordered   10/12/20 1400  For home use only DME 3 n 1  Once        10/12/20 1359   10/12/20 1400  For home use only  DME Tub bench  Once        10/12/20 1359          Allergies  Allergen Reactions  . Azithromycin Rash    Consultations:  PCCM  Cardiology  Infectious disease  Palliative care   Procedures/Studies: DG Abd 1 View  Result Date: 09/21/2020 CLINICAL DATA:  Pain EXAM: ABDOMEN - 1 VIEW COMPARISON:  Sep 19, 2020. FINDINGS: Feeding tube no longer present. There is moderate stool in the colon. There is no bowel dilatation or air-fluid level to suggest bowel obstruction. No evident free air. IMPRESSION: Moderate stool in colon. No evident bowel obstruction or free  air on supine examination. Electronically Signed   By: Lowella Grip III M.D.   On: 09/21/2020 09:57   DG Abd 1 View  Result Date: 09/19/2020 CLINICAL DATA:  Nasogastric tube placement. EXAM: ABDOMEN - 1 VIEW COMPARISON:  One view chest 09/17/2020 FINDINGS: A feeding tube projects over the right upper quadrant of the abdomen consistent with tip in the distal stomach or proximal duodenum. The previously demonstrated nasogastric tube has been removed. The visualized bowel gas pattern is normal. Mild lumbar spine degenerative changes are present. IMPRESSION: Feeding tube projects to the right upper quadrant of the abdomen consistent with position in the distal stomach or proximal duodenum. Electronically Signed   By: Richardean Sale M.D.   On: 09/19/2020 12:12   CT HEAD WO CONTRAST  Result Date: 09/17/2020 CLINICAL DATA:  Acute neuro deficit, stroke suspected, acute COPD exacerbation EXAM: CT HEAD WITHOUT CONTRAST TECHNIQUE: Contiguous axial images were obtained from the base of the skull through the vertex without intravenous contrast. COMPARISON:  09/11/2020 CT, MRI 09/13/2020 FINDINGS: Brain: Stable benign mineralization in the bilateral basal ganglia. No evidence of acute infarction, hemorrhage, hydrocephalus, extra-axial collection, visible mass lesion or mass effect. Symmetric prominence of the ventricles, cisterns and sulci compatible with parenchymal volume loss. Patchy areas of white matter hypoattenuation are most compatible with chronic microvascular angiopathy. Vascular: Atherosclerotic calcification of the carotid siphons. No hyperdense vessel. Skull: No calvarial fracture or suspicious osseous lesion. No scalp swelling or hematoma. Sinuses/Orbits: Layering air-fluid level noted in the left maxillary sinus with some mild thickening in the ethmoids. Mastoid air cells and middle ear cavities are clear. Prior bilateral lens extractions. Stable mineralization along the left lateral rectus insertion.  Orbits otherwise unremarkable. Other: None. IMPRESSION: No acute intracranial abnormality. If there is persisting concern for acute infarction, MRI is more sensitive and specific for early features of ischemia. Air-fluid level left maxillary sinus, possibly related to instrumentation versus sinusitis. Electronically Signed   By: Lovena Le M.D.   On: 09/17/2020 04:53   MR BRAIN WO CONTRAST  Result Date: 09/13/2020 CLINICAL DATA:  Altered mental status.  Stroke presentation. EXAM: MRI HEAD WITHOUT CONTRAST TECHNIQUE: Multiplanar, multiecho pulse sequences of the brain and surrounding structures were obtained without intravenous contrast. COMPARISON:  Head CT 09/11/2020 FINDINGS: Brain: Diffusion imaging does not show any acute or subacute infarction. The brain shows generalized age related atrophy. No focal abnormality affects brainstem or cerebellum. Cerebral hemispheres show mild changes of chronic small vessel disease, fairly typical for age. No cortical or large vessel territory infarction. No mass lesion, hemorrhage, hydrocephalus or extra-axial collection. Vascular: Major vessels at the base of the brain show flow. Skull and upper cervical spine: Negative Sinuses/Orbits: No significant sinus inflammation. Bilateral mastoid effusions, often seen with mechanical ventilation. Orbits negative. Other: None IMPRESSION: No acute brain finding. Mild chronic small-vessel ischemic change of  the white matter, fairly typical for age. Electronically Signed   By: Nelson Chimes M.D.   On: 09/13/2020 14:39   MR CERVICAL SPINE W WO CONTRAST  Result Date: 10/05/2020 CLINICAL DATA:  Initial evaluation for epidural abscess. Klebsiella bacteremia. EXAM: MRI CERVICAL, THORACIC AND LUMBAR SPINE WITHOUT AND WITH CONTRAST TECHNIQUE: Multiplanar and multiecho pulse sequences of the cervical spine, to include the craniocervical junction and cervicothoracic junction, and thoracic and lumbar spine, were obtained without and with  intravenous contrast. CONTRAST:  33mL GADAVIST GADOBUTROL 1 MMOL/ML IV SOLN COMPARISON:  None available. FINDINGS: MRI CERVICAL SPINE FINDINGS Alignment: Straightening of the normal cervical lordosis. No listhesis. Vertebrae: Vertebral body height maintained without acute or chronic fracture. Bone marrow signal intensity within normal limits. Small benign hemangioma noted within the C5 vertebral body. No worrisome osseous lesions. No evidence for osteomyelitis discitis or septic arthritis. Cord: Normal signal and morphology. No epidural abscess or other collection. No abnormal enhancement. Posterior Fossa, vertebral arteries, paraspinal tissues: Chronic microvascular ischemic changes noted within the visualized pons/brainstem. Partially empty sella noted. Craniocervical junction within normal limits. Paraspinous and prevertebral soft tissues normal. Normal flow voids seen within the vertebral arteries bilaterally. Disc levels: C2-C3: Mild disc bulge with right-sided uncovertebral and facet hypertrophy. No spinal stenosis. Moderate right C3 foraminal narrowing. C3-C4: Small central disc protrusion with resultant mild spinal stenosis, but no cord impingement. Superimposed mild uncovertebral and facet hypertrophy without significant foraminal stenosis. C4-C5: Small central disc protrusion with resultant mild spinal stenosis, but no cord impingement. Left-sided facet degeneration. Foramina remain patent. C5-C6: Central disc protrusion indents the ventral thecal sac. Resultant mild spinal stenosis with minimal flattening of the ventral cord. Left-sided uncovertebral and facet hypertrophy. No significant foraminal encroachment. C6-C7: Minimal disc bulge. No significant spinal stenosis. Foramina remain patent. C7-T1: Negative interspace. Left-sided facet hypertrophy. No significant stenosis. MRI THORACIC SPINE FINDINGS Alignment: Physiologic with preservation of the normal thoracic kyphosis. No listhesis. Vertebrae:  Vertebral body height maintained without acute or chronic fracture. Bone marrow signal intensity mildly heterogeneous but overall within normal limits. No worrisome osseous lesions. No signal changes to suggest osteomyelitis discitis or septic arthritis. Cord: Normal signal and morphology. No epidural abscess or other collection. No abnormal enhancement. Paraspinal and other soft tissues: Paraspinous soft tissues demonstrate no acute finding. Small layering bilateral pleural effusions, left slightly larger than right. Small benign appearing simple cyst partially visualized at the upper pole the left kidney. Visualized visceral structures otherwise unremarkable. Disc levels: T6-7: Central disc osteophyte complex indents the ventral thecal sac (series 22, image 20). Secondary mild flattening of the ventral cord without cord signal changes. Thecal sac fairly capacious at this level without significant spinal stenosis. Foramina remain patent. T7-8: Lobulated central disc protrusion indents the ventral thecal sac, slightly asymmetric to the right. Flattening of the ventral cord, worse on the right. No cord signal changes or significant spinal stenosis. Foramina remain patent. Otherwise, normal for age multilevel degenerative disc disease without significant canal or foraminal stenosis. No other significant focal disc herniation. MRI LUMBAR SPINE FINDINGS Segmentation: Standard. Lowest well-formed disc space labeled the L5-S1 level. Alignment: Trace retrolisthesis of L2 on L3, with 2 mm anterolisthesis of L4 on L5 and L5 on S1, chronic and facet mediated. Alignment otherwise normal preservation of the normal lumbar lordosis. Vertebrae: Vertebral body height maintained without acute or chronic fracture. Bone marrow signal intensity mildly heterogeneous but overall within normal limits. No worrisome osseous lesions. No findings to suggest osteomyelitis discitis. Mild reactive endplate change about  the T11-12 and T12-L1  interspace anteriorly felt to be consistent with degenerative change. No findings to suggest septic arthritis. Conus medullaris and cauda equina: Conus extends to the L1 level. Conus and cauda equina appear normal. No epidural abscess or other collection. No abnormal enhancement. Paraspinal and other soft tissues: Minimal edema and enhancement within the lower posterior paraspinous musculature, nonspecific, but could reflect changes of muscular injury and/or strain. Possible acute myositis could also have this appearance. No discrete collections. Paraspinous soft tissues demonstrate no other acute finding. 1 cm simple cyst present at the upper pole the left kidney. Visualized visceral structures otherwise unremarkable. Disc levels: L1-2:  Unremarkable. L2-3: Trace retrolisthesis. Minimal disc bulge with facet hypertrophy. No stenosis. L3-4: Minimal disc bulge. Mild bilateral facet hypertrophy. No significant canal or foraminal stenosis. L4-5: 2 mm anterolisthesis. Diffuse disc bulge with disc desiccation. Moderate bilateral facet hypertrophy. Associated small bilateral joint effusions. Resultant mild-to-moderate canal with bilateral lateral recess stenosis. Mild left L4 foraminal narrowing. L5-S1: Trace anterolisthesis. Disc desiccation with minimal disc bulge. Moderate right worse than left facet hypertrophy with associated small joint effusions. No canal or lateral recess stenosis. Foramina remain patent. IMPRESSION: 1. No MRI evidence for osteomyelitis discitis or septic arthritis within the cervical, thoracic, or lumbar spine. No epidural abscess or other collection. 2. Mild edema and enhancement within the lower posterior paraspinous musculature, nonspecific, but could reflect changes of mild muscular injury and/or strain. Possible acute myositis could also have this appearance. No discrete collections. 3. Mild multilevel cervical spondylosis with resultant mild spinal stenosis at C3-4 through C5-6. 4.  Degenerative spondylosis at T6-7 and T7-8 with associated cord flattening, but no significant spinal stenosis. 5. Mild for age multilevel lumbar spondylosis, most pronounced at L4-5 where there is resultant mild-to-moderate bilateral lateral recess stenosis. No frank impingement. 6. Small layering bilateral pleural effusions, left greater than right. Electronically Signed   By: Jeannine Boga M.D.   On: 10/05/2020 03:45   MR THORACIC SPINE W WO CONTRAST  Result Date: 10/05/2020 CLINICAL DATA:  Initial evaluation for epidural abscess. Klebsiella bacteremia. EXAM: MRI CERVICAL, THORACIC AND LUMBAR SPINE WITHOUT AND WITH CONTRAST TECHNIQUE: Multiplanar and multiecho pulse sequences of the cervical spine, to include the craniocervical junction and cervicothoracic junction, and thoracic and lumbar spine, were obtained without and with intravenous contrast. CONTRAST:  93mL GADAVIST GADOBUTROL 1 MMOL/ML IV SOLN COMPARISON:  None available. FINDINGS: MRI CERVICAL SPINE FINDINGS Alignment: Straightening of the normal cervical lordosis. No listhesis. Vertebrae: Vertebral body height maintained without acute or chronic fracture. Bone marrow signal intensity within normal limits. Small benign hemangioma noted within the C5 vertebral body. No worrisome osseous lesions. No evidence for osteomyelitis discitis or septic arthritis. Cord: Normal signal and morphology. No epidural abscess or other collection. No abnormal enhancement. Posterior Fossa, vertebral arteries, paraspinal tissues: Chronic microvascular ischemic changes noted within the visualized pons/brainstem. Partially empty sella noted. Craniocervical junction within normal limits. Paraspinous and prevertebral soft tissues normal. Normal flow voids seen within the vertebral arteries bilaterally. Disc levels: C2-C3: Mild disc bulge with right-sided uncovertebral and facet hypertrophy. No spinal stenosis. Moderate right C3 foraminal narrowing. C3-C4: Small central  disc protrusion with resultant mild spinal stenosis, but no cord impingement. Superimposed mild uncovertebral and facet hypertrophy without significant foraminal stenosis. C4-C5: Small central disc protrusion with resultant mild spinal stenosis, but no cord impingement. Left-sided facet degeneration. Foramina remain patent. C5-C6: Central disc protrusion indents the ventral thecal sac. Resultant mild spinal stenosis with minimal flattening of the ventral cord.  Left-sided uncovertebral and facet hypertrophy. No significant foraminal encroachment. C6-C7: Minimal disc bulge. No significant spinal stenosis. Foramina remain patent. C7-T1: Negative interspace. Left-sided facet hypertrophy. No significant stenosis. MRI THORACIC SPINE FINDINGS Alignment: Physiologic with preservation of the normal thoracic kyphosis. No listhesis. Vertebrae: Vertebral body height maintained without acute or chronic fracture. Bone marrow signal intensity mildly heterogeneous but overall within normal limits. No worrisome osseous lesions. No signal changes to suggest osteomyelitis discitis or septic arthritis. Cord: Normal signal and morphology. No epidural abscess or other collection. No abnormal enhancement. Paraspinal and other soft tissues: Paraspinous soft tissues demonstrate no acute finding. Small layering bilateral pleural effusions, left slightly larger than right. Small benign appearing simple cyst partially visualized at the upper pole the left kidney. Visualized visceral structures otherwise unremarkable. Disc levels: T6-7: Central disc osteophyte complex indents the ventral thecal sac (series 22, image 20). Secondary mild flattening of the ventral cord without cord signal changes. Thecal sac fairly capacious at this level without significant spinal stenosis. Foramina remain patent. T7-8: Lobulated central disc protrusion indents the ventral thecal sac, slightly asymmetric to the right. Flattening of the ventral cord, worse on the  right. No cord signal changes or significant spinal stenosis. Foramina remain patent. Otherwise, normal for age multilevel degenerative disc disease without significant canal or foraminal stenosis. No other significant focal disc herniation. MRI LUMBAR SPINE FINDINGS Segmentation: Standard. Lowest well-formed disc space labeled the L5-S1 level. Alignment: Trace retrolisthesis of L2 on L3, with 2 mm anterolisthesis of L4 on L5 and L5 on S1, chronic and facet mediated. Alignment otherwise normal preservation of the normal lumbar lordosis. Vertebrae: Vertebral body height maintained without acute or chronic fracture. Bone marrow signal intensity mildly heterogeneous but overall within normal limits. No worrisome osseous lesions. No findings to suggest osteomyelitis discitis. Mild reactive endplate change about the T11-12 and T12-L1 interspace anteriorly felt to be consistent with degenerative change. No findings to suggest septic arthritis. Conus medullaris and cauda equina: Conus extends to the L1 level. Conus and cauda equina appear normal. No epidural abscess or other collection. No abnormal enhancement. Paraspinal and other soft tissues: Minimal edema and enhancement within the lower posterior paraspinous musculature, nonspecific, but could reflect changes of muscular injury and/or strain. Possible acute myositis could also have this appearance. No discrete collections. Paraspinous soft tissues demonstrate no other acute finding. 1 cm simple cyst present at the upper pole the left kidney. Visualized visceral structures otherwise unremarkable. Disc levels: L1-2:  Unremarkable. L2-3: Trace retrolisthesis. Minimal disc bulge with facet hypertrophy. No stenosis. L3-4: Minimal disc bulge. Mild bilateral facet hypertrophy. No significant canal or foraminal stenosis. L4-5: 2 mm anterolisthesis. Diffuse disc bulge with disc desiccation. Moderate bilateral facet hypertrophy. Associated small bilateral joint effusions.  Resultant mild-to-moderate canal with bilateral lateral recess stenosis. Mild left L4 foraminal narrowing. L5-S1: Trace anterolisthesis. Disc desiccation with minimal disc bulge. Moderate right worse than left facet hypertrophy with associated small joint effusions. No canal or lateral recess stenosis. Foramina remain patent. IMPRESSION: 1. No MRI evidence for osteomyelitis discitis or septic arthritis within the cervical, thoracic, or lumbar spine. No epidural abscess or other collection. 2. Mild edema and enhancement within the lower posterior paraspinous musculature, nonspecific, but could reflect changes of mild muscular injury and/or strain. Possible acute myositis could also have this appearance. No discrete collections. 3. Mild multilevel cervical spondylosis with resultant mild spinal stenosis at C3-4 through C5-6. 4. Degenerative spondylosis at T6-7 and T7-8 with associated cord flattening, but no significant spinal stenosis. 5.  Mild for age multilevel lumbar spondylosis, most pronounced at L4-5 where there is resultant mild-to-moderate bilateral lateral recess stenosis. No frank impingement. 6. Small layering bilateral pleural effusions, left greater than right. Electronically Signed   By: Jeannine Boga M.D.   On: 10/05/2020 03:45   MR Lumbar Spine W Wo Contrast  Result Date: 10/05/2020 CLINICAL DATA:  Initial evaluation for epidural abscess. Klebsiella bacteremia. EXAM: MRI CERVICAL, THORACIC AND LUMBAR SPINE WITHOUT AND WITH CONTRAST TECHNIQUE: Multiplanar and multiecho pulse sequences of the cervical spine, to include the craniocervical junction and cervicothoracic junction, and thoracic and lumbar spine, were obtained without and with intravenous contrast. CONTRAST:  29mL GADAVIST GADOBUTROL 1 MMOL/ML IV SOLN COMPARISON:  None available. FINDINGS: MRI CERVICAL SPINE FINDINGS Alignment: Straightening of the normal cervical lordosis. No listhesis. Vertebrae: Vertebral body height maintained  without acute or chronic fracture. Bone marrow signal intensity within normal limits. Small benign hemangioma noted within the C5 vertebral body. No worrisome osseous lesions. No evidence for osteomyelitis discitis or septic arthritis. Cord: Normal signal and morphology. No epidural abscess or other collection. No abnormal enhancement. Posterior Fossa, vertebral arteries, paraspinal tissues: Chronic microvascular ischemic changes noted within the visualized pons/brainstem. Partially empty sella noted. Craniocervical junction within normal limits. Paraspinous and prevertebral soft tissues normal. Normal flow voids seen within the vertebral arteries bilaterally. Disc levels: C2-C3: Mild disc bulge with right-sided uncovertebral and facet hypertrophy. No spinal stenosis. Moderate right C3 foraminal narrowing. C3-C4: Small central disc protrusion with resultant mild spinal stenosis, but no cord impingement. Superimposed mild uncovertebral and facet hypertrophy without significant foraminal stenosis. C4-C5: Small central disc protrusion with resultant mild spinal stenosis, but no cord impingement. Left-sided facet degeneration. Foramina remain patent. C5-C6: Central disc protrusion indents the ventral thecal sac. Resultant mild spinal stenosis with minimal flattening of the ventral cord. Left-sided uncovertebral and facet hypertrophy. No significant foraminal encroachment. C6-C7: Minimal disc bulge. No significant spinal stenosis. Foramina remain patent. C7-T1: Negative interspace. Left-sided facet hypertrophy. No significant stenosis. MRI THORACIC SPINE FINDINGS Alignment: Physiologic with preservation of the normal thoracic kyphosis. No listhesis. Vertebrae: Vertebral body height maintained without acute or chronic fracture. Bone marrow signal intensity mildly heterogeneous but overall within normal limits. No worrisome osseous lesions. No signal changes to suggest osteomyelitis discitis or septic arthritis. Cord:  Normal signal and morphology. No epidural abscess or other collection. No abnormal enhancement. Paraspinal and other soft tissues: Paraspinous soft tissues demonstrate no acute finding. Small layering bilateral pleural effusions, left slightly larger than right. Small benign appearing simple cyst partially visualized at the upper pole the left kidney. Visualized visceral structures otherwise unremarkable. Disc levels: T6-7: Central disc osteophyte complex indents the ventral thecal sac (series 22, image 20). Secondary mild flattening of the ventral cord without cord signal changes. Thecal sac fairly capacious at this level without significant spinal stenosis. Foramina remain patent. T7-8: Lobulated central disc protrusion indents the ventral thecal sac, slightly asymmetric to the right. Flattening of the ventral cord, worse on the right. No cord signal changes or significant spinal stenosis. Foramina remain patent. Otherwise, normal for age multilevel degenerative disc disease without significant canal or foraminal stenosis. No other significant focal disc herniation. MRI LUMBAR SPINE FINDINGS Segmentation: Standard. Lowest well-formed disc space labeled the L5-S1 level. Alignment: Trace retrolisthesis of L2 on L3, with 2 mm anterolisthesis of L4 on L5 and L5 on S1, chronic and facet mediated. Alignment otherwise normal preservation of the normal lumbar lordosis. Vertebrae: Vertebral body height maintained without acute or chronic fracture. Bone  marrow signal intensity mildly heterogeneous but overall within normal limits. No worrisome osseous lesions. No findings to suggest osteomyelitis discitis. Mild reactive endplate change about the T11-12 and T12-L1 interspace anteriorly felt to be consistent with degenerative change. No findings to suggest septic arthritis. Conus medullaris and cauda equina: Conus extends to the L1 level. Conus and cauda equina appear normal. No epidural abscess or other collection. No  abnormal enhancement. Paraspinal and other soft tissues: Minimal edema and enhancement within the lower posterior paraspinous musculature, nonspecific, but could reflect changes of muscular injury and/or strain. Possible acute myositis could also have this appearance. No discrete collections. Paraspinous soft tissues demonstrate no other acute finding. 1 cm simple cyst present at the upper pole the left kidney. Visualized visceral structures otherwise unremarkable. Disc levels: L1-2:  Unremarkable. L2-3: Trace retrolisthesis. Minimal disc bulge with facet hypertrophy. No stenosis. L3-4: Minimal disc bulge. Mild bilateral facet hypertrophy. No significant canal or foraminal stenosis. L4-5: 2 mm anterolisthesis. Diffuse disc bulge with disc desiccation. Moderate bilateral facet hypertrophy. Associated small bilateral joint effusions. Resultant mild-to-moderate canal with bilateral lateral recess stenosis. Mild left L4 foraminal narrowing. L5-S1: Trace anterolisthesis. Disc desiccation with minimal disc bulge. Moderate right worse than left facet hypertrophy with associated small joint effusions. No canal or lateral recess stenosis. Foramina remain patent. IMPRESSION: 1. No MRI evidence for osteomyelitis discitis or septic arthritis within the cervical, thoracic, or lumbar spine. No epidural abscess or other collection. 2. Mild edema and enhancement within the lower posterior paraspinous musculature, nonspecific, but could reflect changes of mild muscular injury and/or strain. Possible acute myositis could also have this appearance. No discrete collections. 3. Mild multilevel cervical spondylosis with resultant mild spinal stenosis at C3-4 through C5-6. 4. Degenerative spondylosis at T6-7 and T7-8 with associated cord flattening, but no significant spinal stenosis. 5. Mild for age multilevel lumbar spondylosis, most pronounced at L4-5 where there is resultant mild-to-moderate bilateral lateral recess stenosis. No frank  impingement. 6. Small layering bilateral pleural effusions, left greater than right. Electronically Signed   By: Jeannine Boga M.D.   On: 10/05/2020 03:45   CARDIAC CATHETERIZATION  Result Date: 09/24/2020 Conclusions: 1. Mild to moderate, non-obstructive coronary artery disease including 20-35% distal LMCA and 50% ostial LAD disease, similar to prior catheterization when FFR was not hemodynamically significant. 2. Patent proximal RCA stents with mild to moderate in-stent restenosis (30-40%). 3. Normal left and right heart filling pressures. 4. Normal pulmonary artery pressures. 5. Normal cardiac output/index. Recommendations: 1. Maintain net even fluid balance. 2. Optimize goal-directed medical therapy with plans to repeat limited echo in 4-6 weeks. 3. Continue secondary prevention of coronary artery disease. Nelva Bush, MD [facility]   DG Chest Port 1 View  Result Date: 10/05/2020 CLINICAL DATA:  75 year old female with shortness of breath. EXAM: PORTABLE CHEST - 1 VIEW COMPARISON:  10/02/2020, 09/21/2020 FINDINGS: The mediastinal contours are within normal limits. No cardiomegaly. Atherosclerotic calcification of the aortic arch. Coronary stent in place. Mild bibasilar hazy opacities, similar to comparison. No significant pleural effusion, focal consolidation, or pneumothorax. Enteric radiopaque material visualized near the splenic flexure. No acute osseous abnormality. IMPRESSION: Unchanged faint bibasilar hazy opacities, likely representing resolving pneumonia. No new or worsening opacities. Aortic Atherosclerosis (ICD10-I70.0). Electronically Signed   By: Ruthann Cancer MD   On: 10/05/2020 10:34   DG Chest Port 1 View  Result Date: 10/02/2020 CLINICAL DATA:  Shortness of breath EXAM: PORTABLE CHEST 1 VIEW COMPARISON:  09/21/2020 FINDINGS: Heart and mediastinal contours are within normal  limits. No focal opacities or effusions. No acute bony abnormality. IMPRESSION: No active disease.  Electronically Signed   By: Rolm Baptise M.D.   On: 10/02/2020 15:38   DG Chest Port 1 View  Result Date: 09/21/2020 CLINICAL DATA:  Shortness of breath EXAM: PORTABLE CHEST 1 VIEW COMPARISON:  Sep 17, 2020 FINDINGS: Endotracheal tube and nasogastric tube have been removed. No pneumothorax. There is atelectatic change in the lung bases. The lungs otherwise are clear. Heart size and pulmonary vascularity are normal. No adenopathy. There is aortic atherosclerosis. No bone lesions. IMPRESSION: No pneumothorax. Bibasilar atelectasis. There may be subtle interspersed infiltrate in these areas in the bases. Lungs otherwise clear. Heart size normal.  Aortic Atherosclerosis (ICD10-I70.0). Electronically Signed   By: Lowella Grip III M.D.   On: 09/21/2020 09:57   DG Chest Port 1 View  Result Date: 09/17/2020 CLINICAL DATA:  Hypoxia, increased respiratory distress, history coronary artery disease post MI, COPD, hypertension, former smoker EXAM: PORTABLE CHEST 1 VIEW COMPARISON:  Portable exam 0922 hours compared to 09/16/2020 FINDINGS: Tip of endotracheal tube projects 3.0 cm above carina. Nasogastric tube coiled in proximal stomach. Upper normal heart size with slight pulmonary vascular congestion. Mediastinal contours normal. Atherosclerotic calcification aorta. Lungs clear. No definite infiltrate, pleural effusion, or pneumothorax. IMPRESSION: No acute abnormalities. Aortic Atherosclerosis (ICD10-I70.0). Electronically Signed   By: Lavonia Dana M.D.   On: 09/17/2020 12:36   DG Chest Port 1 View  Result Date: 09/16/2020 CLINICAL DATA:  Acute respiratory failure. EXAM: PORTABLE CHEST 1 VIEW COMPARISON:  09/15/2020. FINDINGS: Endotracheal tube, NG tube, right IJ line in stable position. Heart size normal. Persistent but improved bilateral interstitial infiltrates. Tiny left pleural effusion cannot be excluded. No pneumothorax. IMPRESSION: 1.  Lines and tubes in stable position. 2.  Persistent but improved  bilateral interstitial infiltrates. Electronically Signed   By: Marcello Moores  Register   On: 09/16/2020 07:44   DG Chest Port 1 View  Result Date: 09/15/2020 CLINICAL DATA:  Acute respiratory failure with hypoxia EXAM: PORTABLE CHEST 1 VIEW COMPARISON:  Radiograph 09/14/2020, CT 09/11/2020 FINDINGS: *Endotracheal tube tip terminates in the mid trachea, approximately 3.2 cm from the expected location of the carina. *Transesophageal tube tip and side-port coiled in the left upper quadrant, distal to the GE junction. *A right IJ catheter tip terminates near the superior cavoatrial junction. *Stable cardiomediastinal contours with a calcified aorta. *Telemetry leads and external support devices overlie the chest. Redemonstration of the heterogeneous opacities throughout much of the left hemithorax and the right lung base as well, overall slightly improved from comparison exam, particular in the periphery of the left lung. No pneumothorax. Suspect trace left effusion. No visible right effusion. Stable cardiomediastinal contours with a calcified aorta. No acute osseous or soft tissue abnormality. Degenerative changes are present in the imaged spine and shoulders. IMPRESSION: Lines and tubes as above. Bilateral heterogeneous opacities in the lungs with fairly significant interval clearing of the left lung periphery. Trace residual left effusion. Aortic Atherosclerosis (ICD10-I70.0). Electronically Signed   By: Lovena Le M.D.   On: 09/15/2020 03:48   DG Chest Port 1 View  Result Date: 09/14/2020 CLINICAL DATA:  Acute respiratory failure with hypoxia EXAM: PORTABLE CHEST 1 VIEW COMPARISON:  Two days ago FINDINGS: Airspace disease asymmetric to the left and progressed. No visible effusion or pneumothorax. Endotracheal tube with tip between the clavicular heads and carina. Right IJ line with tip at the upper cavoatrial junction. Enteric tube with tip at the stomach. Normal heart size  IMPRESSION: 1. Worsening airspace  disease on the left. 2. Unremarkable hardware. Electronically Signed   By: Monte Fantasia M.D.   On: 09/14/2020 10:49   DG Chest Port 1 View  Result Date: 09/12/2020 CLINICAL DATA:  Central line placement EXAM: PORTABLE CHEST 1 VIEW COMPARISON:  09/12/2020 at 4 a.m. FINDINGS: Single frontal view of the chest demonstrates interval placement of a right internal jugular catheter tip overlying the atriocaval junction. Endotracheal tube overlies tracheal air column tip midway between thoracic inlet and carina. Enteric catheter tip projects over the gastric fundus. Cardiac silhouette is stable. Continued left greater than right ground-glass airspace disease. Small left effusion again noted. No pneumothorax. IMPRESSION: 1. No complication after central venous catheter placement. 2. Bilateral left greater than right ground-glass airspace disease worrisome for multifocal pneumonia. 3. Stable trace left effusion. Electronically Signed   By: Randa Ngo M.D.   On: 09/12/2020 22:39      Subjective: Patient is feeling better.  No shortness of breath.  No chest pain.  Discharge Exam: Vitals:   10/12/20 0434 10/12/20 0745 10/12/20 1206 10/12/20 1533  BP: (!) 109/48 (!) 125/45 (!) 122/52 (!) 106/51  Pulse: 92 92 90 90  Resp: 16 18 16 16   Temp: 99.6 F (37.6 C) 98 F (36.7 C) 98.5 F (36.9 C) 98.6 F (37 C)  TempSrc:  Oral Oral Oral  SpO2: 93% 97% 97% 96%  Weight:      Height:        General: Pt is alert, awake, not in acute distress Cardiovascular: RRR, S1/S2 +, no rubs, no gallops Respiratory: CTA bilaterally, no wheezing, no rhonchi Abdominal: Soft, NT, ND, bowel sounds + Extremities: no edema, no cyanosis    The results of significant diagnostics from this hospitalization (including imaging, microbiology, ancillary and laboratory) are listed below for reference.     Microbiology: Recent Results (from the past 240 hour(s))  Culture, blood (x 2)     Status: Abnormal   Collection Time:  10/02/20  6:24 PM   Specimen: BLOOD  Result Value Ref Range Status   Specimen Description   Final    BLOOD BLOOD RIGHT HAND Performed at [facility], [address] [place], [place] [address]    Special Requests   Final    BOTTLES DRAWN AEROBIC AND ANAEROBIC Blood Culture adequate volume Performed at [facility], [address] [place], [place] [address]    Culture  Setup Time   Final    GRAM NEGATIVE RODS IN BOTH AEROBIC AND ANAEROBIC BOTTLES CRITICAL RESULT CALLED TO, READ BACK BY AND VERIFIED WITH: NATHAN BELUE AT 0708 ON 10/03/2020 [place]. Performed at [facility], [place] [address], [place], [place] [address]    Culture (A)  Final    KLEBSIELLA PNEUMONIAE Confirmed Extended Spectrum Beta-Lactamase Producer (ESBL).  In bloodstream infections from ESBL organisms, carbapenems are preferred over piperacillin/tazobactam. They are shown to have a lower risk of mortality.    Report Status 10/05/2020 FINAL  Final   Organism ID, Bacteria KLEBSIELLA PNEUMONIAE  Final      Susceptibility   Klebsiella pneumoniae - MIC*    AMPICILLIN >=32 RESISTANT Resistant     CEFAZOLIN >=64 RESISTANT Resistant     CEFEPIME 8 INTERMEDIATE Intermediate     CEFTAZIDIME >=64 RESISTANT Resistant     CEFTRIAXONE >=64 RESISTANT Resistant     CIPROFLOXACIN <=0.25 SENSITIVE Sensitive     GENTAMICIN <=1 SENSITIVE Sensitive     IMIPENEM <=0.25 SENSITIVE Sensitive  TRIMETH/SULFA <=20 SENSITIVE Sensitive     AMPICILLIN/SULBACTAM >=32 RESISTANT Resistant     PIP/TAZO >=128 RESISTANT Resistant     * KLEBSIELLA PNEUMONIAE  Blood Culture ID Panel (Reflexed)     Status: Abnormal   Collection Time: 10/02/20  6:24 PM  Result Value Ref Range Status   Enterococcus faecalis NOT DETECTED NOT DETECTED Final   Enterococcus Faecium NOT DETECTED NOT DETECTED Final   Listeria monocytogenes NOT DETECTED NOT DETECTED Final   Staphylococcus species NOT DETECTED NOT DETECTED Final   Staphylococcus  aureus (BCID) NOT DETECTED NOT DETECTED Final   Staphylococcus epidermidis NOT DETECTED NOT DETECTED Final   Staphylococcus lugdunensis NOT DETECTED NOT DETECTED Final   Streptococcus species NOT DETECTED NOT DETECTED Final   Streptococcus agalactiae NOT DETECTED NOT DETECTED Final   Streptococcus pneumoniae NOT DETECTED NOT DETECTED Final   Streptococcus pyogenes NOT DETECTED NOT DETECTED Final   A.calcoaceticus-baumannii NOT DETECTED NOT DETECTED Final   Bacteroides fragilis NOT DETECTED NOT DETECTED Final   Enterobacterales DETECTED (A) NOT DETECTED Final    Comment: Enterobacterales represent a large order of gram negative bacteria, not a single organism. CRITICAL RESULT CALLED TO, READ BACK BY AND VERIFIED WITH: NATHAN BELUE AT 0708 ON 10/03/2020 [place].    Enterobacter cloacae complex NOT DETECTED NOT DETECTED Final   Escherichia coli NOT DETECTED NOT DETECTED Final   Klebsiella aerogenes NOT DETECTED NOT DETECTED Final   Klebsiella oxytoca NOT DETECTED NOT DETECTED Final   Klebsiella pneumoniae DETECTED (A) NOT DETECTED Corrected    Comment: CORRECTED ON 05/22 AT 0825: PREVIOUSLY REPORTED AS DETECTED CRITICAL RESULT CALLED TO, READ BACK BY AND VERIFIED WITH: NATHAN BELUE AT 0708 ON 10/03/2020 [place].   Proteus species NOT DETECTED NOT DETECTED Final   Salmonella species NOT DETECTED NOT DETECTED Final   Serratia marcescens NOT DETECTED NOT DETECTED Final   Haemophilus influenzae NOT DETECTED NOT DETECTED Final   Neisseria meningitidis NOT DETECTED NOT DETECTED Final   Pseudomonas aeruginosa NOT DETECTED NOT DETECTED Final   Stenotrophomonas maltophilia NOT DETECTED NOT DETECTED Final   Candida albicans NOT DETECTED NOT DETECTED Final   Candida auris NOT DETECTED NOT DETECTED Final   Candida glabrata NOT DETECTED NOT DETECTED Final   Candida krusei NOT DETECTED NOT DETECTED Final   Candida parapsilosis NOT DETECTED NOT DETECTED Final   Candida tropicalis NOT DETECTED NOT DETECTED Final    Cryptococcus neoformans/gattii NOT DETECTED NOT DETECTED Final   CTX-M ESBL NOT DETECTED NOT DETECTED Final   Carbapenem resistance IMP NOT DETECTED NOT DETECTED Final   Carbapenem resistance KPC NOT DETECTED NOT DETECTED Final   Carbapenem resistance NDM NOT DETECTED NOT DETECTED Final   Carbapenem resist OXA 48 LIKE NOT DETECTED NOT DETECTED Final   Carbapenem resistance VIM NOT DETECTED NOT DETECTED Final    Comment: Performed at [facility], [address] [place], [place] [address]  Culture, blood (x 2)     Status: Abnormal   Collection Time: 10/02/20  6:32 PM   Specimen: BLOOD  Result Value Ref Range Status   Specimen Description   Final    BLOOD RIGHT ANTECUBITAL Performed at [facility], [address] [place], [place] [address]    Special Requests   Final    BOTTLES DRAWN AEROBIC AND ANAEROBIC Blood Culture results may not be optimal due to an excessive volume of blood received in culture bottles Performed at [facility], [address], [place], [place] [address]    Culture  Setup  Time   Final    GRAM NEGATIVE RODS IN BOTH AEROBIC AND ANAEROBIC BOTTLES CRITICAL VALUE NOTED.  VALUE IS CONSISTENT WITH PREVIOUSLY REPORTED AND CALLED VALUE. Performed at [facility], [address] [place], [place] [address]    Culture (A)  Final    KLEBSIELLA PNEUMONIAE SUSCEPTIBILITIES PERFORMED ON PREVIOUS CULTURE WITHIN THE LAST 5 DAYS. Performed at [facility], [place] [address], [place], [place] [address]    Report Status 10/05/2020 FINAL  Final  Culture, Urine     Status: None   Collection Time: 10/03/20  8:52 AM   Specimen: Urine, Clean Catch  Result Value Ref Range Status   Specimen Description   Final    URINE, CLEAN CATCH Performed at [facility], [address], [place], [place] [address]    Special Requests   Final    NONE Performed at [facility], [address] [place], [place] [address]     Culture   Final    NO GROWTH Performed at [facility], [employer] [address], [place], [place] [address]    Report Status 10/04/2020 FINAL  Final     Labs: BNP (last 3 results) Recent Labs    09/11/20 0437 09/11/20 1720 09/12/20 0441  BNP 1,009.8* 1,298.4* 638.4*   Basic Metabolic Panel: Recent Labs  Lab 10/06/20 0413 10/07/20 0438 10/08/20 0519 10/09/20 0618 10/11/20 0639 10/12/20 0705  NA 139 139  140 140 142 139 138  K 4.3 3.7  3.7 3.5 4.3 4.2 4.2  CL 101 104  105 106 109 105 104  CO2 29 26  27 26 25 25 25   GLUCOSE 117* 111*  111* 96 109* 105* 110*  BUN 12 12  12 8 9 15 18   CREATININE 0.66 0.54  0.50 0.47 0.44 0.58 0.48  CALCIUM 8.4* 8.3*  8.4* 8.3* 8.9 9.0 9.1  MG 2.3  --   --  2.3  --   --   PHOS 3.5 3.3  --   --   --   --    Liver Function Tests: Recent Labs  Lab 10/07/20 0438  ALBUMIN 2.3*   No results for input(s): LIPASE, AMYLASE in the last 168 hours. No results for input(s): AMMONIA in the last 168 hours. CBC: Recent Labs  Lab 10/08/20 0519 10/09/20 0618 10/10/20 0628 10/11/20 0639 10/12/20 0705  WBC 6.9 7.9 9.3 10.8* 9.6  NEUTROABS 3.4 4.1 5.3 6.5 5.6  HGB 9.1* 10.0* 10.7* 11.2* 10.7*  HCT 28.3* 31.6* 33.9* 35.0* 33.4*  MCV 94.6 95.2 95.8 94.6 95.2  PLT 249 298 364 433* 401*   Cardiac Enzymes: No results for input(s): CKTOTAL, CKMB, CKMBINDEX, TROPONINI in the last 168 hours. BNP: Invalid input(s): POCBNP CBG: Recent Labs  Lab 10/11/20 2022 10/11/20 2324 10/12/20 0745 10/12/20 1206 10/12/20 1534  GLUCAP 105* 137* 107* 119* 123*   D-Dimer No results for input(s): DDIMER in the last 72 hours. Hgb A1c No results for input(s): HGBA1C in the last 72 hours. Lipid Profile No results for input(s): CHOL, HDL, LDLCALC, TRIG, CHOLHDL, LDLDIRECT in the last 72 hours. Thyroid function studies No results for input(s): TSH, T4TOTAL, T3FREE, THYROIDAB in the last 72 hours.  Invalid input(s): FREET3 Anemia work up No results for  input(s): VITAMINB12, FOLATE, FERRITIN, TIBC, IRON, RETICCTPCT in the last 72 hours. Urinalysis    Component Value Date/Time   COLORURINE YELLOW (A) 10/02/2020 1800   APPEARANCEUR CLEAR (A) 10/02/2020 1800   APPEARANCEUR Clear 03/19/2013  1617   LABSPEC 1.006 10/02/2020 1800   LABSPEC 1.011 03/19/2013 1617   PHURINE 6.0 10/02/2020 1800   GLUCOSEU NEGATIVE 10/02/2020 1800   GLUCOSEU Negative 03/19/2013 1617   HGBUR NEGATIVE 10/02/2020 1800   BILIRUBINUR NEGATIVE 10/02/2020 1800   BILIRUBINUR Negative 03/19/2013 [place] 10/02/2020 1800   PROTEINUR NEGATIVE 10/02/2020 1800   NITRITE NEGATIVE 10/02/2020 1800   LEUKOCYTESUR NEGATIVE 10/02/2020 1800   LEUKOCYTESUR Negative 03/19/2013 1617   Sepsis Labs Invalid input(s): PROCALCITONIN,  WBC,  LACTICIDVEN Microbiology Recent Results (from the past 240 hour(s))  Culture, blood (x 2)     Status: Abnormal   Collection Time: 10/02/20  6:24 PM   Specimen: BLOOD  Result Value Ref Range Status   Specimen Description   Final    BLOOD BLOOD RIGHT HAND Performed at [facility], [address] [place], [place] [address]    Special Requests   Final    BOTTLES DRAWN AEROBIC AND ANAEROBIC Blood Culture adequate volume Performed at [facility], [address] [place], [place] [address]    Culture  Setup Time   Final    GRAM NEGATIVE RODS IN BOTH AEROBIC AND ANAEROBIC BOTTLES CRITICAL RESULT CALLED TO, READ BACK BY AND VERIFIED WITH: NATHAN BELUE AT 6301 ON 10/03/2020 [place]. Performed at [facility], [place] [address] [place], [place] [address]    Culture (A)  Final    KLEBSIELLA PNEUMONIAE Confirmed Extended Spectrum Beta-Lactamase Producer (ESBL).  In bloodstream infections from ESBL organisms, carbapenems are preferred over piperacillin/tazobactam. They are shown to have a lower risk of mortality.    Report Status 10/05/2020 FINAL  Final   Organism ID, Bacteria KLEBSIELLA PNEUMONIAE  Final       Susceptibility   Klebsiella pneumoniae - MIC*    AMPICILLIN >=32 RESISTANT Resistant     CEFAZOLIN >=64 RESISTANT Resistant     CEFEPIME 8 INTERMEDIATE Intermediate     CEFTAZIDIME >=64 RESISTANT Resistant     CEFTRIAXONE >=64 RESISTANT Resistant     CIPROFLOXACIN <=0.25 SENSITIVE Sensitive     GENTAMICIN <=1 SENSITIVE Sensitive     IMIPENEM <=0.25 SENSITIVE Sensitive     TRIMETH/SULFA <=20 SENSITIVE Sensitive     AMPICILLIN/SULBACTAM >=32 RESISTANT Resistant     PIP/TAZO >=128 RESISTANT Resistant     * KLEBSIELLA PNEUMONIAE  Blood Culture ID Panel (Reflexed)     Status: Abnormal   Collection Time: 10/02/20  6:24 PM  Result Value Ref Range Status   Enterococcus faecalis NOT DETECTED NOT DETECTED Final   Enterococcus Faecium NOT DETECTED NOT DETECTED Final   Listeria monocytogenes NOT DETECTED NOT DETECTED Final   Staphylococcus species NOT DETECTED NOT DETECTED Final   Staphylococcus aureus (BCID) NOT DETECTED NOT DETECTED Final   Staphylococcus epidermidis NOT DETECTED NOT DETECTED Final   Staphylococcus lugdunensis NOT DETECTED NOT DETECTED Final   Streptococcus species NOT DETECTED NOT DETECTED Final   Streptococcus agalactiae NOT DETECTED NOT DETECTED Final   Streptococcus pneumoniae NOT DETECTED NOT DETECTED Final   Streptococcus pyogenes NOT DETECTED NOT DETECTED Final   A.calcoaceticus-baumannii NOT DETECTED NOT DETECTED Final   Bacteroides fragilis NOT DETECTED NOT DETECTED Final   Enterobacterales DETECTED (A) NOT DETECTED Final    Comment: Enterobacterales represent a large order of gram negative bacteria, not a single organism. CRITICAL RESULT CALLED TO, READ BACK BY AND VERIFIED WITH: NATHAN BELUE AT 0708 ON 10/03/2020 [place].    Enterobacter cloacae complex NOT DETECTED NOT DETECTED Final  Escherichia coli NOT DETECTED NOT DETECTED Final   Klebsiella aerogenes NOT DETECTED NOT DETECTED Final   Klebsiella oxytoca NOT DETECTED NOT DETECTED Final   Klebsiella pneumoniae  DETECTED (A) NOT DETECTED Corrected    Comment: CORRECTED ON 05/22 AT 0825: PREVIOUSLY REPORTED AS DETECTED CRITICAL RESULT CALLED TO, READ BACK BY AND VERIFIED WITH: NATHAN BELUE AT 6283 ON 10/03/2020 [place].   Proteus species NOT DETECTED NOT DETECTED Final   Salmonella species NOT DETECTED NOT DETECTED Final   Serratia marcescens NOT DETECTED NOT DETECTED Final   Haemophilus influenzae NOT DETECTED NOT DETECTED Final   Neisseria meningitidis NOT DETECTED NOT DETECTED Final   Pseudomonas aeruginosa NOT DETECTED NOT DETECTED Final   Stenotrophomonas maltophilia NOT DETECTED NOT DETECTED Final   Candida albicans NOT DETECTED NOT DETECTED Final   Candida auris NOT DETECTED NOT DETECTED Final   Candida glabrata NOT DETECTED NOT DETECTED Final   Candida krusei NOT DETECTED NOT DETECTED Final   Candida parapsilosis NOT DETECTED NOT DETECTED Final   Candida tropicalis NOT DETECTED NOT DETECTED Final   Cryptococcus neoformans/gattii NOT DETECTED NOT DETECTED Final   CTX-M ESBL NOT DETECTED NOT DETECTED Final   Carbapenem resistance IMP NOT DETECTED NOT DETECTED Final   Carbapenem resistance KPC NOT DETECTED NOT DETECTED Final   Carbapenem resistance NDM NOT DETECTED NOT DETECTED Final   Carbapenem resist OXA 48 LIKE NOT DETECTED NOT DETECTED Final   Carbapenem resistance VIM NOT DETECTED NOT DETECTED Final    Comment: Performed at [facility], [address] [place], [place] [address]  Culture, blood (x 2)     Status: Abnormal   Collection Time: 10/02/20  6:32 PM   Specimen: BLOOD  Result Value Ref Range Status   Specimen Description   Final    BLOOD RIGHT ANTECUBITAL Performed at [facility], [address] [place], [place] [address]    Special Requests   Final    BOTTLES DRAWN AEROBIC AND ANAEROBIC Blood Culture results may not be optimal due to an excessive volume of blood received in culture bottles Performed at [facility], [address]  [place], [place] [address]    Culture  Setup Time   Final    GRAM NEGATIVE RODS IN BOTH AEROBIC AND ANAEROBIC BOTTLES CRITICAL VALUE NOTED.  VALUE IS CONSISTENT WITH PREVIOUSLY REPORTED AND CALLED VALUE. Performed at [facility], [address] [place], [place] [address]    Culture (A)  Final    KLEBSIELLA PNEUMONIAE SUSCEPTIBILITIES PERFORMED ON PREVIOUS CULTURE WITHIN THE LAST 5 DAYS. Performed at [facility], [place] [address] [place], [place] [address]    Report Status 10/05/2020 FINAL  Final  Culture, Urine     Status: None   Collection Time: 10/03/20  8:52 AM   Specimen: Urine, Clean Catch  Result Value Ref Range Status   Specimen Description   Final    URINE, CLEAN CATCH Performed at [facility], [address], [place], [place] [address]    Special Requests   Final    NONE Performed at [facility], [address], [place], [place] [address]    Culture   Final    NO GROWTH Performed at [facility], [place] [address], [place],  [address]    Report Status 10/04/2020 FINAL  Final     Time coordinating discharge: 13mins  SIGNED:   Kathie Dike, MD  [facility] 10/12/2020, 5:50 PM   If 7PM-7AM, please contact night-coverage  [email or web address]

## 2020-10-12 NOTE — Progress Notes (Signed)
Physical Therapy Treatment Patient Details Name: Kathryn Garrett MRN: [id number] DOB: 05-06-1946 Today's Date: 10/12/2020    History of Present Illness 75 y.o. female with history of CAD with VF arrest s/p PCI to the RCA in 2017 s/p PTCA to the RCA for ISR in 2018, chronic combined systolic and diastolic CHF, COPD with ongoing tobacco use, HTN, HLD, anemia, and narcotic abuse on Suboxone therapy admitted with acute hypoxic respiratory failure requiring mechanical ventilation (extubated 09/17/2020) secondary to COPD exacerbation Klebsiella PNA and acute on chronic combined CHF with transient ST elevation and echo demonstrating new LV dysfunction. Patient with recent transfer to ICU for blood pressure issues requiring pressor support    PT Comments    Pt was sitting on BSC upon arriving. She agrees to PT session and is cooperative and motivated throughout. " I feel better today than I have been." " I'm going home tomorrow with my daughter." Pt was able to stand and ambulate ~ 50 ft with RW with relatively steady gait kinematics. Does have some unsteadiness noted but able to correct without intervention. HR elevated to 115 bpm at peak which is greatly down from any other ambulation this admission. Will benefit from ongoing PT to address deficits while maximizing activity tolerance.     Follow Up Recommendations  Home health PT;Other (comment);Supervision/Assistance - 24 hour;Supervision for mobility/OOB (pt is planning to DC to daughter's house with HHPT to follow)     Equipment Recommendations  Wheelchair (measurements PT);Wheelchair cushion (measurements PT);3in1 (PT);Rolling walker with 5" wheels;Other (comment) (pt would benefit form WC for long distance community access/support)       Precautions / Restrictions Precautions Precautions: Fall Restrictions Weight Bearing Restrictions: No    Mobility  Bed Mobility Overal bed mobility: Needs Assistance Bed Mobility: Supine to Sit      Supine to sit: Supervision;HOB elevated Sit to supine: Supervision        Transfers Overall transfer level: Needs assistance Equipment used: Rolling walker (2 wheeled) Transfers: Sit to/from Stand Sit to Stand: Min guard;Supervision         General transfer comment: Stood from [facility] and from recliner with no physical lifting assistance  Ambulation/Gait Ambulation/Gait assistance: Scientist, forensic (Feet): 50 Feet Assistive device: Rolling walker (2 wheeled) Gait Pattern/deviations: Step-to pattern Gait velocity: decreased   General Gait Details: pt ambulated 50 ft with RW with HR elevtion to 115 bpm. Pt was no SOB today. By far., most activity pt has tolerated since admission       Balance Overall balance assessment: Needs assistance Sitting-balance support: Feet supported Sitting balance-Leahy Scale: Good Sitting balance - Comments: no LOB in sitting with feet support only   Standing balance support: Bilateral upper extremity supported;During functional activity Standing balance-Leahy Scale: Fair Standing balance comment: reliant on RW for safety. does have a little unsteadiness today during ambulation however no intervention required       Cognition Arousal/Alertness: Awake/alert Behavior During Therapy: WFL for tasks assessed/performed Overall Cognitive Status: Within Functional Limits for tasks assessed      General Comments: A and O x 4             Pertinent Vitals/Pain Pain Assessment: 0-10 Pain Score: 3  Pain Location: butt/rash/wound pain Pain Descriptors / Indicators: Sore;Burning Pain Intervention(s): Limited activity within patient's tolerance;Monitored during session;Premedicated before session;Repositioned     PT Goals (current goals can now be found in the care plan section) Acute Rehab PT Goals Patient Stated Goal: get better so I can get  home Progress towards PT goals: Progressing toward goals    Frequency    Min  2X/week      PT Plan Current plan remains appropriate    AM-PAC PT "6 Clicks" Mobility   Outcome Measure  Help needed turning from your back to your side while in a flat bed without using bedrails?: None Help needed moving from lying on your back to sitting on the side of a flat bed without using bedrails?: None Help needed moving to and from a bed to a chair (including a wheelchair)?: A Little Help needed standing up from a chair using your arms (e.g., wheelchair or bedside chair)?: A Little Help needed to walk in hospital room?: A Little Help needed climbing 3-5 steps with a railing? : A Little 6 Click Score: 20    End of Session Equipment Utilized During Treatment: Gait belt Activity Tolerance: Treatment limited secondary to medical complications (Comment) Patient left: in chair;with chair alarm set;with call bell/phone within reach;with family/visitor present Nurse Communication: Mobility status PT Visit Diagnosis: Difficulty in walking, not elsewhere classified (R26.2);Muscle weakness (generalized) (M62.81);Unsteadiness on feet (R26.81)     Time: 7703-4035 PT Time Calculation (min) (ACUTE ONLY): 15 min  Charges:  $Gait Training: 8-22 mins                     Julaine Fusi PTA 10/12/20, 4:38 PM

## 2020-10-13 DIAGNOSIS — I5041 Acute combined systolic (congestive) and diastolic (congestive) heart failure: Secondary | ICD-10-CM | POA: Diagnosis not present

## 2020-10-13 DIAGNOSIS — R0603 Acute respiratory distress: Secondary | ICD-10-CM | POA: Diagnosis not present

## 2020-10-13 DIAGNOSIS — I2510 Atherosclerotic heart disease of native coronary artery without angina pectoris: Secondary | ICD-10-CM | POA: Diagnosis not present

## 2020-10-13 DIAGNOSIS — J441 Chronic obstructive pulmonary disease with (acute) exacerbation: Secondary | ICD-10-CM | POA: Diagnosis not present

## 2020-10-13 LAB — CBC WITH DIFFERENTIAL/PLATELET
Abs Immature Granulocytes: 0.1 10*3/uL — ABNORMAL HIGH (ref 0.00–0.07)
Basophils Absolute: 0.1 10*3/uL (ref 0.0–0.1)
Basophils Relative: 1 %
Eosinophils Absolute: 0.1 10*3/uL (ref 0.0–0.5)
Eosinophils Relative: 1 %
HCT: 30 % — ABNORMAL LOW (ref 36.0–46.0)
Hemoglobin: 9.3 g/dL — ABNORMAL LOW (ref 12.0–15.0)
Immature Granulocytes: 1 %
Lymphocytes Relative: 30 %
Lymphs Abs: 2.9 10*3/uL (ref 0.7–4.0)
MCH: 29.9 pg (ref 26.0–34.0)
MCHC: 31 g/dL (ref 30.0–36.0)
MCV: 96.5 fL (ref 80.0–100.0)
Monocytes Absolute: 1 10*3/uL (ref 0.1–1.0)
Monocytes Relative: 10 %
Neutro Abs: 5.6 10*3/uL (ref 1.7–7.7)
Neutrophils Relative %: 57 %
Platelets: 393 10*3/uL (ref 150–400)
RBC: 3.11 MIL/uL — ABNORMAL LOW (ref 3.87–5.11)
RDW: 15.2 % (ref 11.5–15.5)
WBC: 9.8 10*3/uL (ref 4.0–10.5)
nRBC: 0 % (ref 0.0–0.2)

## 2020-10-13 LAB — GLUCOSE, CAPILLARY
Glucose-Capillary: 101 mg/dL — ABNORMAL HIGH (ref 70–99)
Glucose-Capillary: 106 mg/dL — ABNORMAL HIGH (ref 70–99)
Glucose-Capillary: 110 mg/dL — ABNORMAL HIGH (ref 70–99)
Glucose-Capillary: 120 mg/dL — ABNORMAL HIGH (ref 70–99)

## 2020-10-13 NOTE — Progress Notes (Addendum)
PROGRESS NOTE    Kathryn Garrett  ENI:[id number] DOB: 1945-11-29 DOA: 09/10/2020 PCP: Imagene Riches, NP   Brief Narrative:  74/F presented to the [facility] ED from home with complaints of shortness of breath -Admitted on 4/29 with hypoxic respiratory failure secondary to COPD exacerbation and community-acquired pneumonia [facility] course was complicated by flash pulmonary edema and NSTEMI requiring mechanical ventilation, intubated on 4/30  Post extubation patient continued to slowly improve.  Initially was slated for CIR however unfortunately insurance denied.  Currently bed search initiated for skilled nursing facility.  Patient remains medically stable for discharge.  Received notification of case management that SNF had been denied by patient's insurance.  Appeal currently in progress.  5/22: Patient had acute onset of tachyarrhythmia yesterday.  Responded to bedside.  Ventricular rates approximately 60.  Unclear etiology as morphology.  Sinus.  Initially started amiodarone infusion however patient then dropped her blood pressure and became febrile.  Was transferred to the ICU overnight due to declining blood pressures despite aggressive fluid resuscitation.  Patient now in ICU.  Evidence of septic shock secondary to Klebsiella bacteremia.  On IV cefepime.  Mentating clearly.  Unclear source of sepsis.   5/23: Dynamics improving.  The exact etiology of the Klebsiella bacteremia is unclear.  Urinalysis did not grow any bacteria.  Case has been discussed with PCCM.  The Klebsiella pneumonia is the same bacteria that was found on tracheal aspirate when patient was intubated.  This raises possibility of another infectious focus somewhere in the body.  Considering TEE and will pursue MRI total spine to rule out spinal abscess.  5/24: Notified by pharmacy that Klebsiella isolated from the blood is demonstrating resistance gene.  Antibiotic switched to meropenem.  Before new antibiotic to be started  patient's clinical condition deteriorated.  She became acutely tachycardic, febrile, tachypneic.  PCCM reengaged for recommendations.  6/1: Patient was discharged yesterday, stayed another night for some unknown reasons. Home health services were established yesterday.  Assessment & Plan:   Principal Problem:   Acute exacerbation of chronic obstructive pulmonary disease (COPD) (HCC) Active Problems:   CAP (community acquired pneumonia)   CAD in native artery   HTN (hypertension)   Tobacco abuse   Hyperlipidemia LDL goal <70   Severe sepsis (HCC)   NSTEMI (non-ST elevated myocardial infarction) (HCC)   Hyperglycemia   SOB (shortness of breath)   Acute respiratory failure (HCC)   HFrEF (heart failure with reduced ejection fraction) (HCC)   Acute on chronic HFrEF (heart failure with reduced ejection fraction) (HCC)   SVT (supraventricular tachycardia) (HCC)   Acute combined systolic and diastolic heart failure (HCC)   Pure hypercholesterolemia  Septic shock Secondary to ESBL Klebsiella bacteremia Unclear source of bacteremia Urinalysis clean MRI total spine negative for abscess Was seemingly responding to IV ceftriaxone/cefepime Deteriorated on 5/24 Placed back on Levophed PCCM/ID following No indication for TEE at this point Plan: Levophed has been weaned off since 5/26 Midodrine has been weaned off Continue meropenem with pharmacy dosing for total of 7 days No fevers and blood pressure stabiling   Acute hypoxic respiratory failure  Acute COPD Exacerbation/left lung multifocal pneumonia Flash pulmonary edema on 4/30 -Intubated for flash pulmonary edema, extubated 5/7 -Treated with diuretics as well, now off -Improved and stable, weaned off O2 -Discharge planning, CIR declined, patient decided to go home with home health services.  Sepsis, left lower lobe pneumonia Dysphagia -tracheal aspirate on 4/30 with KLEBSIELLA PNEUMONIAE (resistant to Ampicillin) -Completed  7 days  of antibiotics. -Improving -SLP following, continues to have dysphagia, currently on dysphagia 3, honey thick liquids -Will need close SLP FU at rehab to monitor for continued improvement and changes to diet -Currently on honey thick liquids. Feels that po intake is improving -will likely need SLP to follow up swallowing as outpatient. Repeat MBSS could be repeated in the hospital, but cannot be done until the end of week, if patient is still admitted  Acute systolic congestive heart failure/Flash pulmonary edema NSTEMI Paroxysmal SVT -Echo noted with drop in EF down to 25-30%  -Cardiology consulted, left heart cath noted nonobstructive coronary disease, recommended medical management and repeat echo in 2 to 3 months -Continueaspirin, Plavix. Metoprolol dose being adjusted as tolerated, started on losartan -Follow-up with Dr. Irish Lack in 2 to 3 weeks  Acute metabolic/toxic encephalopathy -ICU stay complicated by severe encephalopathy while on vent -At baseline on buprenorphine/naloxone which was held on admission, now resumed -Also had an MRI brain which was unremarkable -Improved and stable now, PT OT as tolerated   COPD Ongoing tobacco abuse -Counseled, tapered off prednisone, continue duo nebs   DVT prophylaxis: SQ Lovenox Code Status: Full Family Communication: Discussed with patient Disposition Plan: Status is: Inpatient  Remains inpatient appropriate because:Inpatient level of care appropriate due to severity of illness   Dispo: The patient is from: Home              Anticipated d/c is to: Home with home health              Patient currently is not medically stable to d/c.   Difficult to place patient No   Level ofcare: Progressive Cardiac  Consultants:   Palliative care  PCCM  Cardiology  ID  Procedures:   None  Antimicrobials:   Meropenem 5/24>   Subjective: Patient was seen and examined today.  Very excited to go home.  No new  complaints  Objective: Vitals:   10/12/20 1533 10/12/20 2025 10/13/20 0405 10/13/20 0742  BP: (!) 106/51 (!) 105/48 (!) 100/45 (!) 109/56  Pulse: 90 86 75 83  Resp: 16 18 20 17   Temp: 98.6 F (37 C) 98.1 F (36.7 C) 98.5 F (36.9 C) 98.3 F (36.8 C)  TempSrc: Oral  Oral Oral  SpO2: 96% 95% 94% 94%  Weight:      Height:       No intake or output data in the 24 hours ending 10/13/20 1101 Filed Weights   10/09/20 0453 10/10/20 0500 10/11/20 1239  Weight: 60.9 kg 57.1 kg 56.4 kg    Examination:  General.  Pleasant elderly lady, in no acute distress. Pulmonary.  Lungs clear bilaterally, normal respiratory effort. CV.  Regular rate and rhythm, no JVD, rub or murmur. Abdomen.  Soft, nontender, nondistended, BS positive. CNS.  Alert and oriented x3.  No focal neurologic deficit. Extremities.  No edema, no cyanosis, pulses intact and symmetrical. Psychiatry.  Judgment and insight appears normal.   Data Reviewed: I have personally reviewed following labs and imaging studies  CBC: Recent Labs  Lab 10/09/20 0618 10/10/20 0628 10/11/20 0639 10/12/20 0705 10/13/20 0553  WBC 7.9 9.3 10.8* 9.6 9.8  NEUTROABS 4.1 5.3 6.5 5.6 5.6  HGB 10.0* 10.7* 11.2* 10.7* 9.3*  HCT 31.6* 33.9* 35.0* 33.4* 30.0*  MCV 95.2 95.8 94.6 95.2 96.5  PLT 298 364 433* 401* 275   Basic Metabolic Panel: Recent Labs  Lab 10/07/20 0438 10/08/20 0519 10/09/20 0618 10/11/20 0639 10/12/20 0705  NA 139  140  140 142 139 138  K 3.7  3.7 3.5 4.3 4.2 4.2  CL 104  105 106 109 105 104  CO2 26  27 26 25 25 25   GLUCOSE 111*  111* 96 109* 105* 110*  BUN 12  12 8 9 15 18   CREATININE 0.54  0.50 0.47 0.44 0.58 0.48  CALCIUM 8.3*  8.4* 8.3* 8.9 9.0 9.1  MG  --   --  2.3  --   --   PHOS 3.3  --   --   --   --    GFR: Estimated Creatinine Clearance: 46.5 mL/min (by C-G formula based on SCr of 0.48 mg/dL). Liver Function Tests: Recent Labs  Lab 10/07/20 0438  ALBUMIN 2.3*   No results for  input(s): LIPASE, AMYLASE in the last 168 hours. No results for input(s): AMMONIA in the last 168 hours. Coagulation Profile: No results for input(s): INR, PROTIME in the last 168 hours. Cardiac Enzymes: No results for input(s): CKTOTAL, CKMB, CKMBINDEX, TROPONINI in the last 168 hours. BNP (last 3 results) No results for input(s): PROBNP in the last 8760 hours. HbA1C: No results for input(s): HGBA1C in the last 72 hours. CBG: Recent Labs  Lab 10/12/20 1534 10/12/20 2022 10/13/20 0004 10/13/20 0406 10/13/20 0744  GLUCAP 123* 118* 110* 101* 106*   Lipid Profile: No results for input(s): CHOL, HDL, LDLCALC, TRIG, CHOLHDL, LDLDIRECT in the last 72 hours. Thyroid Function Tests: No results for input(s): TSH, T4TOTAL, FREET4, T3FREE, THYROIDAB in the last 72 hours. Anemia Panel: No results for input(s): VITAMINB12, FOLATE, FERRITIN, TIBC, IRON, RETICCTPCT in the last 72 hours. Sepsis Labs: Recent Labs  Lab 10/07/20 0438 10/08/20 0519  PROCALCITON 23.89 7.81    No results found for this or any previous visit (from the past 240 hour(s)).    Radiology Studies: No results found.  Scheduled Meds: . aspirin  81 mg Oral QHS  . buprenorphine-naloxone  2 tablet Sublingual Daily  . Chlorhexidine Gluconate Cloth  6 each Topical QHS  . clopidogrel  75 mg Oral QHS  . empagliflozin  10 mg Oral Daily  . fluticasone  1 spray Each Nare Daily  . insulin aspart  0-15 Units Subcutaneous Q4H  . levothyroxine  88 mcg Oral Q0600  . loratadine  10 mg Oral Daily  . losartan  25 mg Oral Daily  . mouth rinse  15 mL Mouth Rinse BID  . metoprolol succinate  50 mg Oral BID  . mometasone-formoterol  2 puff Inhalation BID  . multivitamin with minerals  1 tablet Oral Daily  . pantoprazole  40 mg Oral Daily  . rosuvastatin  40 mg Oral QHS  . sodium chloride flush  3 mL Intravenous Q12H  . sodium chloride flush  3 mL Intravenous Q12H   Continuous Infusions: . sodium chloride 250 mL (10/11/20  1304)  . sodium chloride Stopped (10/06/20 1418)     LOS: 33 days    Time spent: 25 minutes    Lorella Nimrod, MD [facility]  If 7PM-7AM, please contact night-coverage 10/13/2020, 11:01 AM

## 2020-10-13 NOTE — Care Management Important Message (Signed)
Important Message  Patient Details  Name: SAMYRAH BRUSTER MRN: [id number] Date of Birth: Jul 08, 1945   Medicare Important Message Given:  Yes  Reviewed with patient via room phone due to isolation status.  Patient confirmed she still has copy of Medicare IM left previously in room.  Declined another copy at this time.     Dannette Barbara 10/13/2020, 10:42 AM

## 2020-10-13 NOTE — TOC Transition Note (Signed)
Transition of Care [facility]) - CM/SW Discharge Note   Patient Details  Name: Kathryn Garrett MRN: [id number] Date of Birth: 07-Jun-1945  Transition of Care [facility]) CM/SW Contact:  Alberteen Sam, LCSW Phone Number: 10/13/2020, 1:55 PM   Clinical Narrative:      Patient previously set up with [facility] PT, OT, RN, and adie on 5/31, all equipment including DME 3in1 and tub bench have been delivered to patient's  Room via [facility] with [employer].   CSW spoke with daughter Estill Bamberg who reports she is picking up patient now, no other needs.   Final next level of care: [place] Barriers to Discharge: No Barriers Identified   Patient Goals and CMS Choice Patient states their goals for this hospitalization and ongoing recovery are:: to go home CMS Medicare.gov Compare Post Acute Care list provided to:: Patient Represenative (must comment) (daughter Estill Bamberg) Choice offered to / list presented to : Patient  Discharge Placement                Patient to be transferred to facility by: family Name of family member notified: Estill Bamberg Patient and family notified of of transfer: 10/13/20  Discharge Plan and Services In-house Referral: Clinical Social Work   Post Acute Care Choice: Home Health          DME Arranged: 3-N-1,Tub bench DME Agency: [employer] Date DME Agency Contacted: 10/13/20 Time DME Agency Contacted: 1200 Representative spoke with at DME Agency: [place]: PT,OT,RN,Nurse's Aide [place]: [place] Date [place]: 10/13/20 Time [place]: 1200 Representative spoke with at [place]: [name]  Social Determinants of Health ([place]) Interventions     Readmission Risk Interventions No flowsheet data found.

## 2020-10-13 NOTE — Progress Notes (Signed)
Progress Note  Patient Name: Kathryn Garrett Date of Encounter: 10/13/2020  [facility] Cardiologist: Larae Grooms, MD   Subjective   Reports that she is ambulating little bit better, twice to the bathroom with her walker Has not been into the hallway Feels that she can go home, do rehab at home with PT Reports having family support Denies shortness of breath, no tachycardia, no significant cough, no anginal symptoms  Inpatient Medications    Scheduled Meds: . aspirin  81 mg Oral QHS  . buprenorphine-naloxone  2 tablet Sublingual Daily  . Chlorhexidine Gluconate Cloth  6 each Topical QHS  . clopidogrel  75 mg Oral QHS  . empagliflozin  10 mg Oral Daily  . fluticasone  1 spray Each Nare Daily  . insulin aspart  0-15 Units Subcutaneous Q4H  . levothyroxine  88 mcg Oral Q0600  . loratadine  10 mg Oral Daily  . losartan  25 mg Oral Daily  . mouth rinse  15 mL Mouth Rinse BID  . metoprolol succinate  50 mg Oral BID  . mometasone-formoterol  2 puff Inhalation BID  . multivitamin with minerals  1 tablet Oral Daily  . pantoprazole  40 mg Oral Daily  . rosuvastatin  40 mg Oral QHS  . sodium chloride flush  3 mL Intravenous Q12H  . sodium chloride flush  3 mL Intravenous Q12H   Continuous Infusions: . sodium chloride 250 mL (10/11/20 1304)  . sodium chloride Stopped (10/06/20 1418)   PRN Meds: acetaminophen **OR** acetaminophen, albuterol, artificial tears, docusate, guaiFENesin, ondansetron (ZOFRAN) IV, polyethylene glycol   Vital Signs    Vitals:   10/12/20 1533 10/12/20 2025 10/13/20 0405 10/13/20 0742  BP: (!) 106/51 (!) 105/48 (!) 100/45 (!) 109/56  Pulse: 90 86 75 83  Resp: 16 18 20 17   Temp: 98.6 F (37 C) 98.1 F (36.7 C) 98.5 F (36.9 C) 98.3 F (36.8 C)  TempSrc: Oral  Oral Oral  SpO2: 96% 95% 94% 94%  Weight:      Height:       No intake or output data in the 24 hours ending 10/13/20 1030 Last 3 Weights 10/11/2020 10/10/2020 10/09/2020  Weight  (lbs) 124 lb 4.8 oz 125 lb 14.1 oz 134 lb 4.8 oz  Weight (kg) 56.382 kg 57.1 kg 60.918 kg      Telemetry    Normal sinus rhythm:  reviewed personally by myself  ECG     - Personally Reviewed  Physical Exam   Constitutional:  oriented to person, place, and time. No distress.  HENT:  Head: Grossly normal Eyes:  no discharge. No scleral icterus.  Neck: No JVD, no carotid bruits  Cardiovascular: Regular rate and rhythm, no murmurs appreciated Pulmonary/Chest: Clear to auscultation bilaterally, no wheezes or rails Abdominal: Soft.  no distension.  no tenderness.  Musculoskeletal: Normal range of motion Neurological:  normal muscle tone. Coordination normal. No atrophy Skin: Skin warm and dry Psychiatric: normal affect, pleasant   Labs    High Sensitivity Troponin:   No results for input(s): TROPONINIHS in the last 720 hours.    Chemistry Recent Labs  Lab 10/07/20 0438 10/08/20 0519 10/09/20 0618 10/11/20 0639 10/12/20 0705  NA 139  140   < > 142 139 138  K 3.7  3.7   < > 4.3 4.2 4.2  CL 104  105   < > 109 105 104  CO2 26  27   < > 25 25 25   GLUCOSE  111*  111*   < > 109* 105* 110*  BUN 12  12   < > 9 15 18   CREATININE 0.54  0.50   < > 0.44 0.58 0.48  CALCIUM 8.3*  8.4*   < > 8.9 9.0 9.1  ALBUMIN 2.3*  --   --   --   --   GFRNONAA >60  >60   < > >60 >60 >60  ANIONGAP 9  8   < > 8 9 9    < > = values in this interval not displayed.     Hematology Recent Labs  Lab 10/11/20 2703 10/12/20 0705 10/13/20 0553  WBC 10.8* 9.6 9.8  RBC 3.70* 3.51* 3.11*  HGB 11.2* 10.7* 9.3*  HCT 35.0* 33.4* 30.0*  MCV 94.6 95.2 96.5  MCH 30.3 30.5 29.9  MCHC 32.0 32.0 31.0  RDW 14.7 15.2 15.2  PLT 433* 401* 393    BNPNo results for input(s): BNP, PROBNP in the last 168 hours.   DDimer  No results for input(s): DDIMER in the last 168 hours.   Radiology    No results found.  Cardiac Studies     Patient Profile     75 y.o. female with CAD status post RCA  PCI in 2017 and in-stent restenosis requiring PCI, VF arrest,, chronic systolic diastolic heart failure, COPD, ongoing tobacco abuse, hypertension, hyperlipidemia, COPD, and narcotic abuse admitted with COPD exacerbation and Klebsiella pneumonia as well as acute on chronic systolic and diastolic heart failure.  -- elevated troponin. Echo : EF 25 to 30% -- cardiogenic shock requiring milrinone for diuresis and hypoxic respiratory failure requiring intubation.  left and right heart cath on 5/13 ,  mild to moderate nonobstructive CAD and normal filling pressures.   SVT ,  managed with metoprolol.   Assessment & Plan    SVT/Sinus tachycardia -Paroxysmal,  -Has been maintaining normal sinus rhythm on metoprolol succinate 50 twice daily No change to medications at discharge  Bacteremia/sepsis with hypotension on pressors Enterobacter and Klebsiella pneumonia on blood cultures,  On broad-spectrum antibiotics Hypotension resolved, off pressors, off midodrine Pressure is low but stable  CAD s/p PCI Elevated troponin - cardiac cath showed nonobstructive disease - continue ASA, Plavix, statin beta-blocker  Acute systolic and diastolic heart failure - LVEF reduced to 25-30% Catheterization has been completed, no intervention needed Sepsis, hypotension, now off pressors -Would continue metoprolol succinate 50 twice daily, losartan 25 daily, Jardiance  Weakness/gait instability Discussed need for aggressive PT at home  Long discussion concerning need for discharge follow-up, rehab, medication changes made this admission compared to her medications on arrival  Total encounter time more than 35 minutes  Greater than 50% was spent in counseling and coordination of care with the patient   For questions or updates, please contact [facility] Please consult [email or web address] for contact info under        Signed, Ida Rogue, MD  10/13/2020, 10:30 AM

## 2020-10-20 NOTE — Progress Notes (Deleted)
   Patient ID: Kathryn Garrett, female    DOB: 1945-10-25, 75 y.o.   MRN: [id number]  HPI  Ms Kneale is a 75 y/o female with a history of  Echo report from 09/11/20 reviewed and showed an EF of 25-30% along with mild MR.   RHC/LHC completed 09/24/20 and showed: 1. Mild to moderate, non-obstructive coronary artery disease including 20-35% distal LMCA and 50% ostial LAD disease, similar to prior catheterization when FFR was not hemodynamically significant. 2. Patent proximal RCA stents with mild to moderate in-stent restenosis (30-40%). 3. Normal left and right heart filling pressures. 4. Normal pulmonary artery pressures. 5. Normal cardiac output/index.  Admitted 09/10/20 due to COPD exacerbation and pneumonia. Developed flash pulmonary edema and NSTEMI requiring intubation. Briefly required inotropes. Cardiology, palliative care and ID consults obtained. Hospital course further complicated by development of gram-negative bacteremia. MRI total spine negative for abscess. Given course of IV antibiotics. Initially given IV lasix with transition to oral diuretics. Brain MRI done due to severe encephalopathy which was negative. Discharged after 33 days with home health.   She presents today for her initial visit with a chief complaint of  Review of Systems    Physical Exam  Assessment & Plan:  1: Chronic heart failure with reduced ejection fraction- - NYHA class - BNP 09/12/20 was 970.2  2: HTN- - BP - BMP 10/12/20 reviewed and showed sodium 138, potassium 4.2, creatinine 0.48 and GFR >60  3: COPD-  4: CAD- - saw cardiology Irish Lack) 07/06/20

## 2020-10-21 ENCOUNTER — Telehealth: Payer: Self-pay | Admitting: Family

## 2020-10-21 ENCOUNTER — Ambulatory Visit: Payer: Medicare HMO | Admitting: Family

## 2020-10-21 NOTE — Telephone Encounter (Signed)
Patient did not show for her Heart Failure Clinic appointment on 10/21/20. Will attempt to reschedule.

## 2020-10-31 ENCOUNTER — Telehealth: Payer: Self-pay | Admitting: Physician Assistant

## 2020-10-31 NOTE — Telephone Encounter (Signed)
This is a patient of Dr. Irish Lack with past medical history of CAD, hypertension, hyperlipidemia, and COPD.  She recently had a prolonged hospitalization course in May 2022.  Echocardiogram at the time showed EF down to 25 to 30%.  Left and right heart cath showed stable coronary anatomy without culprit lesion.  Since discharge, patient has been doing well.  She woke up this morning with elevated blood pressure in the 170s.  Heart rate was in the 130s.  Upon taking metoprolol succinate, blood pressure came down to 130s and heart rate came down to 80s.  She is currently residing with her daughter in [place].  She has a follow-up next Tuesday in the cardiologist office.  Daughter is concerned that the patient might have urinary tract infection.  They are seeking care at the local urgent care.  They can obtain the EKG at the urgent care to make sure she has not gone into atrial fibrillation.  Otherwise patient's clinical condition is stable enough to follow-up as outpatient next Tuesday.

## 2020-11-02 ENCOUNTER — Other Ambulatory Visit: Payer: Self-pay

## 2020-11-02 ENCOUNTER — Ambulatory Visit (INDEPENDENT_AMBULATORY_CARE_PROVIDER_SITE_OTHER): Payer: Medicare HMO | Admitting: Family

## 2020-11-02 ENCOUNTER — Encounter: Payer: Self-pay | Admitting: Family

## 2020-11-02 VITALS — BP 130/60 | HR 77 | Ht 59.0 in | Wt 129.0 lb

## 2020-11-02 DIAGNOSIS — K219 Gastro-esophageal reflux disease without esophagitis: Secondary | ICD-10-CM

## 2020-11-02 DIAGNOSIS — Z720 Tobacco use: Secondary | ICD-10-CM

## 2020-11-02 DIAGNOSIS — I25118 Atherosclerotic heart disease of native coronary artery with other forms of angina pectoris: Secondary | ICD-10-CM

## 2020-11-02 DIAGNOSIS — I5022 Chronic systolic (congestive) heart failure: Secondary | ICD-10-CM | POA: Diagnosis not present

## 2020-11-02 DIAGNOSIS — E785 Hyperlipidemia, unspecified: Secondary | ICD-10-CM

## 2020-11-02 MED ORDER — PANTOPRAZOLE SODIUM 40 MG PO TBEC: 40.0000 mg | DELAYED_RELEASE_TABLET | Freq: Every day | ORAL | 2 refills | Status: DC

## 2020-11-02 MED ORDER — EMPAGLIFLOZIN 10 MG PO TABS: 10.0000 mg | ORAL_TABLET | Freq: Every day | ORAL | 5 refills | Status: DC

## 2020-11-02 MED ORDER — LOSARTAN POTASSIUM 50 MG PO TABS: 50.0000 mg | ORAL_TABLET | Freq: Every day | ORAL | 5 refills | Status: DC

## 2020-11-02 MED ORDER — METOPROLOL SUCCINATE ER 50 MG PO TB24: 50.0000 mg | ORAL_TABLET | Freq: Every day | ORAL | 5 refills | Status: DC

## 2020-11-02 MED ORDER — NITROGLYCERIN 0.4 MG SL SUBL: 0.4000 mg | SUBLINGUAL_TABLET | SUBLINGUAL | 3 refills | Status: AC | PRN

## 2020-11-02 MED ORDER — ROSUVASTATIN CALCIUM 40 MG PO TABS: 40.0000 mg | ORAL_TABLET | Freq: Every day | ORAL | 5 refills | Status: DC

## 2020-11-02 NOTE — Progress Notes (Signed)
Office Visit    Patient Name: Kathryn Garrett Date of Encounter: 11/02/2020  PCP:  Imagene Riches, NP   [place]  Cardiologist:  Larae Grooms, MD  Advanced Practice Provider:  No care team member to display Electrophysiologist:  None  Chief Complaint    Kathryn Garrett is a 75 y.o. female with a hx of CAD s/p DES to RCA, hypertension, hyperlipidemia, COPD, HFrEF/ICM.  Presents today for hospital follow-up  Past Medical History    Past Medical History:  Diagnosis Date   Anemia, mild    Arthritis    "qwhere" (02/10/2016)   CAD (coronary artery disease)    a. VF arrest 01/2009/CAD with inferoposterior MI s/p aspiration thrombectomy/BMS of RCA at that time. b. ISR of BMS s/p DES to RCA 06/2010 with moderate LM/LAD disease not significant by FFR. c. s/p angiosculpt PTCA to prox RCA 01/2016 for ISR. d. Aggressive PTCA to prox RCA for ISR 05/2016.   Cardiac arrest ([place]) 01/2009   a. in setting of inf-post STEMI 01/2009 (VF).   Chest pain 06/04/2016   Chronic bronchitis (HCC)    Chronic combined systolic and diastolic CHF (congestive heart failure) ([place])    a. EF 40-45% by cath 2010. b. 60-65% with grade 1 DD by echo 09/2015   COPD (chronic obstructive pulmonary disease) ([place])    Hyperlipidemia 01/28/2016   Hyperlipidemia LDL goal <70 01/28/2016   Hypertension    Lung mass    MI (myocardial infarction) ([place]) 01/2009   Narcotic abuse ([place])    pt now taking Suboxone tid   Pleural effusion on left 09/24/2015   Pneumonia 06/2014; 09/2015   Pre-diabetes    Pulmonary nodule    PVD (peripheral vascular disease) (HCC)    mild atherosclerosis of infrarenal aorta, 25% ostial left renal artery stenosis, 50% ostial right common iliac by cath 2010   S/P PTCA (percutaneous transluminal coronary angioplasty) 02/10/16 to RCA lesion for in stent restenois 02/11/2016   Subclavian artery stenosis (HCC)    a. >50% by duplex 05/2016.   Tobacco abuse    Unstable angina [facility])     Past Surgical History:  Procedure Laterality Date   ABDOMINAL HYSTERECTOMY     ANKLE FRACTURE SURGERY Right 2000s   CARDIAC CATHETERIZATION N/A 02/10/2016   Procedure: Left Heart Cath and Coronary Angiography;  Surgeon: Jettie Booze, MD;  Location: [place] [facility];  Service: Cardiovascular;  Laterality: N/A;   CARDIAC CATHETERIZATION N/A 02/10/2016   Procedure: Coronary Balloon Angioplasty;  Surgeon: Jettie Booze, MD;  Location: [place] [facility];  Service: Cardiovascular;  Laterality: N/A;  instent RCA   CARDIAC CATHETERIZATION N/A 06/05/2016   Procedure: Left Heart Cath and Coronary Angiography;  Surgeon: Leonie Man, MD;  Location: [place] [facility];  Service: Cardiovascular;  Laterality: N/A;   CARDIAC CATHETERIZATION N/A 06/05/2016   Procedure: Coronary Balloon Angioplasty;  Surgeon: Leonie Man, MD;  Location: [place] [facility];  Service: Cardiovascular;  Laterality: N/A;   CHEST TUBE INSERTION N/A 10/08/2015   Procedure: PLEURX CATH REMOVAL;  Surgeon: [name], MD;  Location: [facility];  Service: Thoracic;  Laterality: N/A;   CORONARY ANGIOPLASTY WITH STENT PLACEMENT  01/2009; 2012   FRACTURE SURGERY     RIGHT/LEFT HEART CATH AND CORONARY ANGIOGRAPHY N/A 09/24/2020   Procedure: RIGHT/LEFT HEART CATH AND CORONARY ANGIOGRAPHY;  Surgeon: Nelva Bush, MD;  Location: [place] [facility];  Service: Cardiovascular;  Laterality: N/A;  VIDEO ASSISTED THORACOSCOPY (VATS)/THOROCOTOMY Left 09/29/2015   Procedure: PREOP BRONCHOSCOPY, LEFT THORACOSCOPY, POSSIBLE THORACOTOMY, PLEURAL BIOPSY, TALC;  Surgeon: [name], MD;  Location: [facility];  Service: General;  Laterality: Left;    Allergies  Allergies  Allergen Reactions   Azithromycin Rash    History of Present Illness    Kathryn Garrett is a 75 y.o. female with a hx of CAD s/p DES to RCA, hypertension, hyperlipidemia, COPD, HFrEF/ICM last seen while hospitalized.  Her CAD dates back to inferior MI  treated with bare metal stent to RCA in 2010. In 06/2010 she had DES to proximal to mid RCA for ISR of the BMS. Her left main and LAD disease was moderate and not significant. She was admitted 01/2016 for unstable angina with cardiac cath showing ISR of RCA for which she underwent balloon angioplasty with ISR 75% ? 0% and Plavix was resumed. She had unstable angina again 05/2016 with cardiac cath showing ISR within proximal RCA treated with PTCA with scoring balloon. She had bruit on exam with subsequent carotid doppler showing bilateral ECA stenosis >50% but no significant ICA stenosis.   She had COVID 01/2020 and required oxygen therapy. She was started on Levothyroxine 05/2020 due to hypothyroidism.   She was seen in clinic 07/06/20 doing well from cardiac perspective and no changes were made.  She was hospitalized 09/10/20 with acute hypoxic respiratory failure secondary to COPD exacerbation. Complicated by flash pulmonary edema and NSTEMI requiring mechanical ventillation on 4/30. Echo 09/11/20 LVEF 25-30%, indeterminite diastolic parameters, mild MR. Marland Kitchen She was extubated 09/18/20. She underwent [facility] 09/24/20 showing mild to moderate nonobstructive CAD including  20-35% distal LMCA and 50% ostial LAD disease with FFR not hemodynamicaly significant, prox RCA stent with mild-moderat ICR (30-40%), normal pulmonary pressure, cardiac output/index, filling pressures. Hospital course further complicated by gram negative bacteremia, evaluated by ID and treated with meropenem. Required midodrine during admission for hypotension. She did have SVT while admitted and was discharged on Toprol.   Presents today with her daughter for follow-up.  She has been staying with one of her daughters in [place] and receiving home health therapies including nursing, OT, speech, nursing.  She reports no chest pain, Shikhman tightness since hospital discharge.  She was very deconditioned upon leaving the hospital has been gradually  improving her strength.  Reports her dyspnea on exertion is present but still improving.  Her diastolic blood pressure was routinely in the 50s so her daughter decreased her Toprol from 50 mg twice daily to once daily.  She is only required Lasix 3 times since hospital discharge for pedal edema.  She has quit smoking since hospital discharge and strongly encouraged her to continue cessation.  She drinks less than 2 L/day and follows a low-salt diet.  She is still doing thickened liquids at the direction of her speech therapist.  We reviewed her hospitalization as well as cardiac medications in detail and she and her daughter were very appreciative of the explanation.  She has had lab work since hospital discharge with her primary care provider, detailed below, and her primary care provider is supplementing her potassium.  Lab work 10/20/20 K 3.6, creat 0.49, GFR >59, Hb 9.8,  Lab work 10/27/20 Na 144, K 3.1, Cl 106, Glu 87, creat 0.61, GFR >59, Albumin 3.4, AST 12, ALT 11, Ca 8.4, Hb 9.5  EKGs/Labs/Other Studies Reviewed:   The following studies were reviewed today:  L/RHC (09/24/2020): Mild to moderate, non-obstructive coronary artery disease including 20-35% distal  LMCA and 50% ostial LAD disease, similar to prior catheterization when FFR was not hemodynamically significant. Patent proximal RCA stents with mild to moderate in-stent restenosis (30-40%). Normal left and right heart filling pressures. Normal pulmonary artery pressures. Normal cardiac output/index.   TTE (09/11/2020):  1. Recommend limited echo with iv contrast agent (definity) for addequate  assessment of LVEF and wall motion.. Left ventricular ejection fraction,  by estimation, is 25 to 30%. The left ventricle has severely decreased  function. Left ventricular  endocardial border not optimally defined to evaluate regional wall motion.  Left ventricular diastolic parameters are indeterminate.   2. Right ventricular systolic function  is normal. The right ventricular  size is normal.   3. The mitral valve was not well visualized. Mild mitral valve  regurgitation.   4. The aortic valve was not well visualized. Aortic valve regurgitation  is not visualized.  EKG:  No EKG today.  Recent Labs: 09/12/2020: B Natriuretic Peptide 970.2 10/02/2020: ALT 28 10/09/2020: Magnesium 2.3 10/12/2020: BUN 18; Creatinine, Ser 0.48; Potassium 4.2; Sodium 138 10/13/2020: Hemoglobin 9.3; Platelets 393  Recent Lipid Panel    Component Value Date/Time   CHOL 107 10/02/2019 1228   CHOL 85 03/20/2013 0557   TRIG 183 (H) 09/15/2020 0552   TRIG 112 03/20/2013 0557   HDL 41 10/02/2019 1228   HDL 21 (L) 03/20/2013 0557   CHOLHDL 2.6 10/02/2019 1228   CHOLHDL 3.5 06/05/2016 0911   VLDL 24 06/05/2016 0911   VLDL 22 03/20/2013 0557   LDLCALC 43 10/02/2019 1228   LDLCALC 42 03/20/2013 0557   Home Medications   No outpatient medications have been marked as taking for the 11/02/20 encounter (Appointment) with Loel Dubonnet, NP.     Review of Systems    All other systems reviewed and are otherwise negative except as noted above.  Physical Exam    VS:  There were no vitals taken for this visit. , BMI There is no height or weight on file to calculate BMI.  Wt Readings from Last 3 Encounters:  10/11/20 124 lb 4.8 oz (56.4 kg)  07/06/20 135 lb (61.2 kg)  10/02/19 135 lb 3.2 oz (61.3 kg)     GEN: Well nourished, well developed, in no acute distress. HEENT: normal. Neck: Supple, no JVD, carotid bruits, or masses. Cardiac: RRR, no murmurs, rubs, or gallops. No clubbing, cyanosis, edema.  Radials/DP/PT 2+ and equal bilaterally.  Respiratory:  Respirations regular and unlabored, clear to auscultation bilaterally. GI: Soft, nontender, nondistended. MS: No deformity or atrophy. Skin: Warm and dry, no rash. Neuro:  Strength and sensation are intact. Psych: Normal affect.  Assessment & Plan    SVT / Sinus tachycardia -noted during  recent hospitalization and COPD exacerbation.  No palpitations. Continue Toprol 50mg  daily.   HTN -Home BP greater than 130/80.  Increase losartan from 25 mg daily to 50 mg daily.  Recent BMP with PCP normal renal function. Educated to report blood pressure consistently greater than 130/80.  CAD s/p DES to RCA - RCA initially with BMS with ISR requiring DES and subsequent balloon angioplasty. LHC 09/2020 mild to moderate nonobstructive disease of distal LMCA and LAD with patent prox RCA stent mild to moderate ISR (30-40%). Stable with no anginal symptoms. No indication for ischemic evaluation. GDMT includes aspirin, crestor, toprol, jardiance, plavix. Refills provided. In setting of recurrent ISR would recommend continuation of DAPT. Recent CBC with PCP no evidence of anemia.   Chronic systolic and diastolic heart failure /  Ischemic cardiomyopathy -euvolemic and well compensated on exam.  Daily weight at home has been stable.  LVEF by echo 09/11/2020 of 25-30% in the setting of NSTEMI, heart failure exacerbation, bacteremia, complicated hospitalization.  Plan for repeat echocardiogram, ordered today.  GDMT includes Jardiance 10 mg daily, Toprol 50 mg daily, losartan 50 mg daily, Lasix 20 mg as needed.  Will await results of echocardiogram prior to further optimization of GDMT.  If LVEF remains depressed consider referral to EP for consideration of ICD as well as transition to [facility] and or addition of MRA.  Low-salt diet, fluid restriction encouraged.  HLD, LDL goal <70 - 10/02/19 LDL 43. Not fasting today, due for lipid panel at next fasting labs. Continue Crestor 40mg  daily. Denies myalgias. Refill provided today.   Tobacco use -has not smoked since hospitalization.  Continue cessation encouraged.  Encouraged to utilize 2000 quit now.  GERD - Continue Protonix. Refill provided.   COPD - No signs of acute exacerbations. Follow with PCP. Educated on pursed lip breathing.   Disposition: Follow up   after echocardiogram  with Dr. Christ Kick or APP.    Signed, Loel Dubonnet, NP 11/02/2020, 11:32 AM [place]

## 2020-11-02 NOTE — Patient Instructions (Addendum)
Medication Instructions:  Your physician has recommended you make the following change in your medication:  CHANGE Losartan to 50mg  daily  CONTINUE Metoprolol Succinate 50mg  daily  *If you need a refill on your cardiac medications before your next appointment, please call your pharmacy*   Lab Work: None ordered today.   Testing/Procedures: Your physician has requested that you have an echocardiogram. Echocardiography is a painless test that uses sound waves to create images of your heart. It provides your doctor with information about the size and shape of your heart and how well your heart's chambers and valves are working. This procedure takes approximately one hour. There are no restrictions for this procedure.  Follow-Up: At [facility], you and your health needs are our priority.  As part of our continuing mission to provide you with exceptional heart care, we have created designated Provider Care Teams.  These Care Teams include your primary Cardiologist (physician) and Advanced Practice Providers (APPs -  Physician Assistants and Nurse Practitioners) who all work together to provide you with the care you need, when you need it.  We recommend signing up for the patient portal called "MyChart".  Sign up information is provided on this After Visit Summary.  MyChart is used to connect with patients for Virtual Visits (Telemedicine).  Patients are able to view lab/test results, encounter notes, upcoming appointments, etc.  Non-urgent messages can be sent to your provider as well.   To learn more about what you can do with MyChart, go to [email or web address].    Your next appointment:   After Echo  The format for your next appointment:   In Person  Provider:   You may see Larae Grooms, MD or one of the following Advanced Practice Providers on your designated Care Team:   Melina Copa, PA-C Ermalinda Barrios, PA-C   Other Instructions  For coronary artery disease often called  "heart disease" we aim for optimal guideline directed medical therapy. We use the "A, B, C"s to help keep [place] on track!  A = Aspirin 81mg  daily B = Beta blocker which helps to relax the heart. This is your Metoprolol. C = Cholesterol control. You take Rosuvastatin to help control your cholesterol.  D = Don't forget nitroglycerin! This is an emergency tablet to be used if you have chest pain.  Your medicines that help strengthen your heart are: Jardiance, Losartan, Metoprolol, Lasix (as needed.   Recommend weighing daily and keeping a log. Please call our office if you have weight gain of 2 pounds overnight or 5 pounds in 1 week. This is when you should take your Furosemide (Lasix) aka your fluid pill.   Heart Healthy Diet Recommendations: A low-salt diet is recommended. Meats should be grilled, baked, or boiled. Avoid fried foods. Focus on lean protein sources like fish or chicken with vegetables and fruits. The American Heart Association is a Microbiologist!  American Heart Association Diet and Lifeystyle Recommendations   Exercise recommendations: The American Heart Association recommends 150 minutes of moderate intensity exercise weekly. Try 30 minutes of moderate intensity exercise 4-5 times per week. This could include walking, jogging, or swimming.  Pursed Lip Breathing  How to perform pursed lip breathing Being short of breath can make you tense and anxious. Before you start this breathing exercise, take a minute to relax your shoulders and close your eyes. Then: Start the exercise by closing your mouth. Breathe in through your nose, taking a normal breath. You can do this at your normal rate  of breathing. If you feel you are not getting enough air, breathe in while slowly counting to 2 or 3. Pucker (purse) your lips as if you were going to whistle. Gently tighten the muscles of your abdomenor press on your abdomen to help push the air out. Breathe out slowly through your pursed  lips. Take at least twice as long to breathe out as it takes you to breathe in. Make sure that you breathe out all of the air, but do not force air out. Ask your health care provider how often and how long to do this exercise.

## 2020-11-24 ENCOUNTER — Other Ambulatory Visit: Payer: Self-pay

## 2020-11-24 ENCOUNTER — Ambulatory Visit (HOSPITAL_COMMUNITY): Payer: Medicare HMO | Attending: Cardiology

## 2020-11-24 DIAGNOSIS — I5022 Chronic systolic (congestive) heart failure: Secondary | ICD-10-CM

## 2020-11-24 DIAGNOSIS — E785 Hyperlipidemia, unspecified: Secondary | ICD-10-CM | POA: Diagnosis present

## 2020-11-24 DIAGNOSIS — I25118 Atherosclerotic heart disease of native coronary artery with other forms of angina pectoris: Secondary | ICD-10-CM | POA: Insufficient documentation

## 2020-11-24 LAB — ECHOCARDIOGRAM COMPLETE
Area-P 1/2: 2.4 cm2
S' Lateral: 2 cm

## 2020-11-26 ENCOUNTER — Encounter (HOSPITAL_BASED_OUTPATIENT_CLINIC_OR_DEPARTMENT_OTHER): Payer: Self-pay | Admitting: Family

## 2020-11-26 ENCOUNTER — Ambulatory Visit (INDEPENDENT_AMBULATORY_CARE_PROVIDER_SITE_OTHER): Payer: Medicare HMO | Admitting: Family

## 2020-11-26 ENCOUNTER — Other Ambulatory Visit: Payer: Self-pay

## 2020-11-26 VITALS — BP 130/64 | HR 63 | Ht 59.0 in | Wt 129.0 lb

## 2020-11-26 DIAGNOSIS — J449 Chronic obstructive pulmonary disease, unspecified: Secondary | ICD-10-CM

## 2020-11-26 DIAGNOSIS — E785 Hyperlipidemia, unspecified: Secondary | ICD-10-CM | POA: Diagnosis not present

## 2020-11-26 DIAGNOSIS — I5022 Chronic systolic (congestive) heart failure: Secondary | ICD-10-CM

## 2020-11-26 DIAGNOSIS — I10 Essential (primary) hypertension: Secondary | ICD-10-CM

## 2020-11-26 DIAGNOSIS — K219 Gastro-esophageal reflux disease without esophagitis: Secondary | ICD-10-CM

## 2020-11-26 DIAGNOSIS — I25118 Atherosclerotic heart disease of native coronary artery with other forms of angina pectoris: Secondary | ICD-10-CM | POA: Diagnosis not present

## 2020-11-26 DIAGNOSIS — E876 Hypokalemia: Secondary | ICD-10-CM

## 2020-11-26 DIAGNOSIS — Z720 Tobacco use: Secondary | ICD-10-CM

## 2020-11-26 MED ORDER — FUROSEMIDE 20 MG PO TABS: 20.0000 mg | ORAL_TABLET | ORAL | 5 refills | Status: DC | PRN

## 2020-11-26 NOTE — Patient Instructions (Addendum)
Medication Instructions:  Continue your current medications  *If you need a refill on your cardiac medications before your next appointment, please call your pharmacy*   Lab Work: None ordered today  Testing/Procedures: Your recent echocardiogram showed your heart pumping function had returned to normal.    Follow-Up: At [facility], you and your health needs are our priority.  As part of our continuing mission to provide you with exceptional heart care, we have created designated Provider Care Teams.  These Care Teams include your primary Cardiologist (physician) and Advanced Practice Providers (APPs -  Physician Assistants and Nurse Practitioners) who all work together to provide you with the care you need, when you need it.  We recommend signing up for the patient portal called "MyChart".  Sign up information is provided on this After Visit Summary.  MyChart is used to connect with patients for Virtual Visits (Telemedicine).  Patients are able to view lab/test results, encounter notes, upcoming appointments, etc.  Non-urgent messages can be sent to your provider as well.   To learn more about what you can do with MyChart, go to [email or web address].    Your next appointment:   03/02/21 at 11:30 with Loel Dubonnet, NP    Other Instructions  Heart Healthy Diet Recommendations: A low-salt diet is recommended. Meats should be grilled, baked, or boiled. Avoid fried foods. Focus on lean protein sources like fish or chicken with vegetables and fruits. The American Heart Association is a Microbiologist!  American Heart Association Diet and Lifeystyle Recommendations   Exercise recommendations: The American Heart Association recommends 150 minutes of moderate intensity exercise weekly. Try 30 minutes of moderate intensity exercise 4-5 times per week. This could include walking, jogging, or swimming.  Recommend weighing daily and keeping a log. Please call our office if you have  weight gain of 2 pounds overnight or 5 pounds in 1 week.

## 2020-11-26 NOTE — Progress Notes (Signed)
Office Visit    Patient Name: Kathryn Garrett Date of Encounter: 11/26/2020  PCP:  Imagene Riches, NP   [place]  Cardiologist:  Larae Grooms, MD  Advanced Practice Provider:  No care team member to display Electrophysiologist:  None  Chief Complaint    Kathryn Garrett is a 75 y.o. female with a hx of CAD s/p DES to RCA, hypertension, hyperlipidemia, COPD, HFrEF/ICM.  Presents today for follow-up after echocardiogram  Past Medical History    Past Medical History:  Diagnosis Date   Anemia, mild    Arthritis    "qwhere" (02/10/2016)   CAD (coronary artery disease)    a. VF arrest 01/2009/CAD with inferoposterior MI s/p aspiration thrombectomy/BMS of RCA at that time. b. ISR of BMS s/p DES to RCA 06/2010 with moderate LM/LAD disease not significant by FFR. c. s/p angiosculpt PTCA to prox RCA 01/2016 for ISR. d. Aggressive PTCA to prox RCA for ISR 05/2016.   Cardiac arrest ([place]) 01/2009   a. in setting of inf-post STEMI 01/2009 (VF).   Chest pain 06/04/2016   Chronic bronchitis (HCC)    Chronic combined systolic and diastolic CHF (congestive heart failure) ([place])    a. EF 40-45% by cath 2010. b. 60-65% with grade 1 DD by echo 09/2015   COPD (chronic obstructive pulmonary disease) ([place])    Hyperlipidemia 01/28/2016   Hyperlipidemia LDL goal <70 01/28/2016   Hypertension    Lung mass    MI (myocardial infarction) ([place]) 01/2009   Narcotic abuse ([place])    pt now taking Suboxone tid   Pleural effusion on left 09/24/2015   Pneumonia 06/2014; 09/2015   Pre-diabetes    Pulmonary nodule    PVD (peripheral vascular disease) (HCC)    mild atherosclerosis of infrarenal aorta, 25% ostial left renal artery stenosis, 50% ostial right common iliac by cath 2010   S/P PTCA (percutaneous transluminal coronary angioplasty) 02/10/16 to RCA lesion for in stent restenois 02/11/2016   Subclavian artery stenosis (HCC)    a. >50% by duplex 05/2016.   Tobacco abuse    Unstable  angina [facility])    Past Surgical History:  Procedure Laterality Date   ABDOMINAL HYSTERECTOMY     ANKLE FRACTURE SURGERY Right 2000s   CARDIAC CATHETERIZATION N/A 02/10/2016   Procedure: Left Heart Cath and Coronary Angiography;  Surgeon: Jettie Booze, MD;  Location: [place] CV LAB;  Service: Cardiovascular;  Laterality: N/A;   CARDIAC CATHETERIZATION N/A 02/10/2016   Procedure: Coronary Balloon Angioplasty;  Surgeon: Jettie Booze, MD;  Location: [place] [facility] LAB;  Service: Cardiovascular;  Laterality: N/A;  instent RCA   CARDIAC CATHETERIZATION N/A 06/05/2016   Procedure: Left Heart Cath and Coronary Angiography;  Surgeon: Leonie Man, MD;  Location: [place] [facility] LAB;  Service: Cardiovascular;  Laterality: N/A;   CARDIAC CATHETERIZATION N/A 06/05/2016   Procedure: Coronary Balloon Angioplasty;  Surgeon: Leonie Man, MD;  Location: [place] [facility] LAB;  Service: Cardiovascular;  Laterality: N/A;   CHEST TUBE INSERTION N/A 10/08/2015   Procedure: PLEURX CATH REMOVAL;  Surgeon: [name], MD;  Location: [facility] ORS;  Service: Thoracic;  Laterality: N/A;   CORONARY ANGIOPLASTY WITH STENT PLACEMENT  01/2009; 2012   FRACTURE SURGERY     RIGHT/LEFT HEART CATH AND CORONARY ANGIOGRAPHY N/A 09/24/2020   Procedure: RIGHT/LEFT HEART CATH AND CORONARY ANGIOGRAPHY;  Surgeon: Nelva Bush, MD;  Location: [place] CV LAB;  Service: Cardiovascular;  Laterality: N/A;  VIDEO ASSISTED THORACOSCOPY (VATS)/THOROCOTOMY Left 09/29/2015   Procedure: PREOP BRONCHOSCOPY, LEFT THORACOSCOPY, POSSIBLE THORACOTOMY, PLEURAL BIOPSY, TALC;  Surgeon: [name], MD;  Location: [facility] ORS;  Service: General;  Laterality: Left;    Allergies  Allergies  Allergen Reactions   Azithromycin Rash    History of Present Illness    Kathryn Garrett is a 75 y.o. female with a hx of CAD s/p DES to RCA, hypertension, hyperlipidemia, COPD, HFrEF/ICM last seen 11/02/2020.  Her CAD dates back to inferior  MI treated with bare metal stent to RCA in 2010. In 06/2010 she had DES to proximal to mid RCA for ISR of the BMS. Her left main and LAD disease was moderate and not significant. She was admitted 01/2016 for unstable angina with cardiac cath showing ISR of RCA for which she underwent balloon angioplasty with ISR 75% ? 0% and Plavix was resumed. She had unstable angina again 05/2016 with cardiac cath showing ISR within proximal RCA treated with PTCA with scoring balloon. She had bruit on exam with subsequent carotid doppler showing bilateral ECA stenosis >50% but no significant ICA stenosis.   She had COVID 01/2020 and required oxygen therapy. She was started on Levothyroxine 05/2020 due to hypothyroidism.   She was seen in clinic 07/06/20 doing well from cardiac perspective and no changes were made.  She was hospitalized 09/10/20 with acute hypoxic respiratory failure secondary to COPD exacerbation. Complicated by flash pulmonary edema and NSTEMI requiring mechanical ventillation on 4/30. Echo 09/11/20 LVEF 25-30%, indeterminite diastolic parameters, mild MR. Marland Kitchen She was extubated 09/18/20. She underwent [facility] 09/24/20 showing mild to moderate nonobstructive CAD including  20-35% distal LMCA and 50% ostial LAD disease with FFR not hemodynamicaly significant, prox RCA stent with mild-moderat ICR (30-40%), normal pulmonary pressure, cardiac output/index, filling pressures. Hospital course further complicated by gram negative bacteremia, evaluated by ID and treated with meropenem. Required midodrine during admission for hypotension. She did have SVT while admitted and was discharged on Toprol.   She was seen in follow-up 11/02/2020 with her daughter.  She was staying with her daughter in [place] and receiving home health therapies.  She had quit smoking since hospital discharge.  Her daughter decreased her Toprol dose to daily due to low diastolic blood pressure readings.  She was recommended for repeat echocardiogram to  reassess LVEF  Lab work 10/20/20 K 3.6, creat 0.49, GFR >59, Hb 9.8,  Lab work 10/27/20 Na 144, K 3.1, Cl 106, Glu 87, creat 0.61, GFR >59, Albumin 3.4, AST 12, ALT 11, Ca 8.4, Hb 9.5  Echocardiogram 11/21/2020 LVEF 60 to 65%, no R WMA, mild LVH, grade 1 diastolic dysfunction, RV normal size and function, normal PASP, trivial MR.  She presents today for follow-up with her 2 daughters.  We reviewed her echocardiogram in detail.  She is very pleased with the result.  She continues to participate in speech therapy 3 times per week.  She is doing physical therapy once per week at home.  She continues to stay with her daughter in [place].  Blood pressure at home has been routinely 185U-314H systolic Reports no shortness of breath nor dyspnea on exertion. Reports no chest pain, pressure, or tightness. No orthopnea, PND.  Reports her edema has improved and she is only using her Lasix very sparingly.  Reports no palpitations.  She continues to not smoke and was congratulated.  EKGs/Labs/Other Studies Reviewed:   The following studies were reviewed today:  Echo 11/24/20  1. Left ventricular ejection fraction, by  estimation, is 60 to 65%. The  left ventricle has normal function. The left ventricle has no regional  wall motion abnormalities. There is mild concentric left ventricular  hypertrophy. Left ventricular diastolic  parameters are consistent with Grade I diastolic dysfunction (impaired  relaxation).   2. Right ventricular systolic function is normal. The right ventricular  size is normal. There is normal pulmonary artery systolic pressure. The  estimated right ventricular systolic pressure is 35.3 mmHg.   3. The mitral valve is normal in structure. Trivial mitral valve  regurgitation. No evidence of mitral stenosis.   4. The aortic valve is tricuspid. Aortic valve regurgitation is not  visualized. No aortic stenosis is present.   5. The inferior vena cava is normal in size with greater than 50%   respiratory variability, suggesting right atrial pressure of 3 mmHg.   Comparison(s): Compared to prior TTE in 08/2020, the LVEF has returned to  normal (60-65% from 25-30%).  L/RHC (09/24/2020): Mild to moderate, non-obstructive coronary artery disease including 20-35% distal LMCA and 50% ostial LAD disease, similar to prior catheterization when FFR was not hemodynamically significant. Patent proximal RCA stents with mild to moderate in-stent restenosis (30-40%). Normal left and right heart filling pressures. Normal pulmonary artery pressures. Normal cardiac output/index.   TTE (09/11/2020):  1. Recommend limited echo with iv contrast agent (definity) for addequate  assessment of LVEF and wall motion.. Left ventricular ejection fraction,  by estimation, is 25 to 30%. The left ventricle has severely decreased  function. Left ventricular  endocardial border not optimally defined to evaluate regional wall motion.  Left ventricular diastolic parameters are indeterminate.   2. Right ventricular systolic function is normal. The right ventricular  size is normal.   3. The mitral valve was not well visualized. Mild mitral valve  regurgitation.   4. The aortic valve was not well visualized. Aortic valve regurgitation  is not visualized.  EKG:  No EKG today.  Recent Labs: 09/12/2020: B Natriuretic Peptide 970.2 10/02/2020: ALT 28 10/09/2020: Magnesium 2.3 10/12/2020: BUN 18; Creatinine, Ser 0.48; Potassium 4.2; Sodium 138 10/13/2020: Hemoglobin 9.3; Platelets 393  Recent Lipid Panel    Component Value Date/Time   CHOL 107 10/02/2019 1228   CHOL 85 03/20/2013 0557   TRIG 183 (H) 09/15/2020 0552   TRIG 112 03/20/2013 0557   HDL 41 10/02/2019 1228   HDL 21 (L) 03/20/2013 0557   CHOLHDL 2.6 10/02/2019 1228   CHOLHDL 3.5 06/05/2016 0911   VLDL 24 06/05/2016 0911   VLDL 22 03/20/2013 0557   LDLCALC 43 10/02/2019 1228   LDLCALC 42 03/20/2013 0557   Home Medications   Current Meds   Medication Sig   acetaminophen (TYLENOL) 500 MG tablet Take 1,000 mg by mouth every 6 (six) hours as needed for headache.   albuterol (PROVENTIL HFA;VENTOLIN HFA) 108 (90 Base) MCG/ACT inhaler Inhale 2 puffs into the lungs every 4 (four) hours as needed for wheezing or shortness of breath.   aspirin EC 81 MG tablet Take 81 mg by mouth at bedtime.    azelastine (ASTELIN) 0.1 % nasal spray USE 2 SPRAYS BID UNTIL DIRECTED TO STOP. USE FOR RUNNY NOSE   Buprenorphine HCl-Naloxone HCl 8-2 MG FILM Place 0.5 Film under the tongue daily.   cetirizine (ZYRTEC) 10 MG tablet Take 1 tablet (10 mg total) by mouth daily.   clopidogrel (PLAVIX) 75 MG tablet TAKE 1 TABLET(75 MG) BY MOUTH AT BEDTIME   empagliflozin (JARDIANCE) 10 MG TABS tablet Take 1 tablet (10  mg total) by mouth daily.   fluticasone (FLONASE) 50 MCG/ACT nasal spray Place 1 spray into both nostrils daily.   food thickener (THICK IT) POWD Use as needed for honey thick liquids   levothyroxine (SYNTHROID) 88 MCG tablet Take 88 mcg by mouth every morning.   losartan (COZAAR) 50 MG tablet Take 1 tablet (50 mg total) by mouth daily.   metoprolol succinate (TOPROL-XL) 50 MG 24 hr tablet Take 1 tablet (50 mg total) by mouth daily. Take with or immediately following a meal.   nitroGLYCERIN (NITROSTAT) 0.4 MG SL tablet Place 1 tablet (0.4 mg total) under the tongue every 5 (five) minutes as needed for chest pain.   pantoprazole (PROTONIX) 40 MG tablet Take 1 tablet (40 mg total) by mouth daily. Further refills per PCP.   Potassium Chloride ER 20 MEQ TBCR Take 1 tablet by mouth daily.   rosuvastatin (CRESTOR) 40 MG tablet Take 1 tablet (40 mg total) by mouth at bedtime.   TRELEGY ELLIPTA 100-62.5-25 MCG/INH AEPB Take 1 puff by mouth daily.   triamcinolone cream (KENALOG) 0.1 % Apply topically 2 (two) times daily. to affected area   [DISCONTINUED] furosemide (LASIX) 20 MG tablet Take 20 mg by mouth daily.     Review of Systems    All other systems  reviewed and are otherwise negative except as noted above.  Physical Exam    VS:  BP 130/64   Pulse 63   Ht 4\' 11"  (1.499 m)   Wt 129 lb (58.5 kg)   SpO2 99%   BMI 26.05 kg/m  , BMI Body mass index is 26.05 kg/m.  Wt Readings from Last 3 Encounters:  11/26/20 129 lb (58.5 kg)  11/02/20 129 lb (58.5 kg)  10/11/20 124 lb 4.8 oz (56.4 kg)     GEN: Well nourished, well developed, in no acute distress. HEENT: normal. Neck: Supple, no JVD, carotid bruits, or masses. Cardiac: RRR, no murmurs, rubs, or gallops. No clubbing, cyanosis, edema.  Radials/DP/PT 2+ and equal bilaterally.  Respiratory:  Respirations regular and unlabored, clear to auscultation bilaterally. GI: Soft, nontender, nondistended. MS: No deformity or atrophy. Skin: Warm and dry, no rash. Neuro:  Strength and sensation are intact. Psych: Normal affect.  Assessment & Plan    SVT / Sinus tachycardia -noted during recent hospitalization and COPD exacerbation.  No palpitations. Continue Toprol 50mg  daily.   HTN -blood pressure control improved since increased dose of losartan.  BP well controlled. Continue current antihypertensive regimen.  ome BP greater than 130/80.  She has upcoming lab work with primary care today.  CAD s/p DES to RCA - RCA initially with BMS with ISR requiring DES and subsequent balloon angioplasty. LHC 09/2020 mild to moderate nonobstructive disease of distal LMCA and LAD with patent prox RCA stent mild to moderate ISR (30-40%). Stable with no anginal symptoms. No indication for ischemic evaluation. GDMT includes aspirin, crestor, toprol, jardiance, plavix. In setting of recurrent ISR would recommend continuation of DAPT. Heart healthy diet and regular cardiovascular exercise encouraged.  Discussed participating in [place] program when she returns to her home.  Chronic systolic and diastolic heart failure with recovered LVEF./ Ischemic cardiomyopathy -Euvolemic and well compensated on exam.  Daily  weight at home has been stable.  LVEF by echo 09/11/2020 of 25-30% in the setting of NSTEMI, heart failure exacerbation, bacteremia, complicated hospitalization.  Echocardiogram 11/2020 showed LVEF improved to 70-62%, grade 1 diastolic dysfunction, mild LVH, no significant valvular abnormality. GDMT includes Jardiance 10 mg  daily, Toprol 50 mg daily, losartan 50 mg daily, Lasix 20 mg as needed.  To keep heart pumping function strong she prefers to continue her current medications agreement with this plan.  Low-salt diet, fluid restriction encouraged.    Hypokalemia - Following with primary care. If potassium normal, anticipate potassium could be reduce to take as-needed with Lasix. If potassium remains low or low normal, anticipate continued repletion.   HLD, LDL goal <70 - 10/02/19 LDL 43. Not fasting today, due for lipid panel at next fasting labs. Anticipate collection with PCP.  Tobacco use -has not smoked since hospitalization.  Continue cessation encouraged.  Encouraged to utilize 1-800 quit now.  GERD - Continue Protonix.   COPD - No signs of acute exacerbations. Follow with PCP.   Disposition: Follow up in 3 month(s) with Dr. Christ Kick or APP.    Signed, Loel Dubonnet, NP 11/26/2020, 11:15 AM [place]

## 2020-12-07 ENCOUNTER — Telehealth: Payer: Self-pay

## 2020-12-07 NOTE — Telephone Encounter (Signed)
   Primary Cardiologist: Larae Grooms, MD  Chart reviewed as part of pre-operative protocol coverage. Given past medical history and time since last visit, based on ACC/AHA guidelines, Kathryn Garrett would be at acceptable risk for the planned procedure without further cardiovascular testing.   I will route this recommendation to the requesting party via Epic fax function and remove from pre-op pool.  Please call with questions.  [name]. [name] NP-C    12/07/2020, 3:50 PM [name] [name] Office [phone] Fax [phone]

## 2020-12-07 NOTE — Telephone Encounter (Signed)
   [facility] Pre-operative Risk Assessment    Patient Name: Kathryn Garrett  DOB: 09-23-1945 MRN: [id number]  HEARTCARE STAFF:  - IMPORTANT!!!!!! Under Visit Info/Reason for Call, type in Other and utilize the format Clearance MM/DD/YY or Clearance TBD. Do not use dashes or single digits. - Please review there is not already an duplicate clearance open for this procedure. - If request is for dental extraction, please clarify the # of teeth to be extracted. - If the patient is currently at the dentist's office, call Pre-Op Callback Staff (MA/nurse) to input urgent request.  - If the patient is not currently in the dentist office, please route to the Pre-Op pool.  Request for surgical clearance:  What type of surgery is being performed? Extractions - 4 teeth  When is this surgery scheduled? TBD  What type of clearance is required (medical clearance vs. Pharmacy clearance to hold med vs. Both)? medical  Are there any medications that need to be held prior to surgery and how long? None Listed  Practice name and name of physician performing surgery? [facility]     Dr Daylene Katayama  What is the office phone number? [phone]   7.   What is the office fax number? [phone]  8.   Anesthesia type (None, local, MAC, general)? Local    Alainah Phang T 12/07/2020, 3:21 PM  _________________________________________________________________   (provider comments below)

## 2021-03-02 ENCOUNTER — Ambulatory Visit (INDEPENDENT_AMBULATORY_CARE_PROVIDER_SITE_OTHER): Payer: Medicare HMO | Admitting: Family

## 2021-03-02 ENCOUNTER — Other Ambulatory Visit: Payer: Self-pay

## 2021-03-02 ENCOUNTER — Encounter (HOSPITAL_BASED_OUTPATIENT_CLINIC_OR_DEPARTMENT_OTHER): Payer: Self-pay | Admitting: Family

## 2021-03-02 VITALS — BP 112/64 | HR 71 | Ht 59.0 in | Wt 135.4 lb

## 2021-03-02 DIAGNOSIS — I5022 Chronic systolic (congestive) heart failure: Secondary | ICD-10-CM | POA: Diagnosis not present

## 2021-03-02 DIAGNOSIS — E876 Hypokalemia: Secondary | ICD-10-CM | POA: Diagnosis not present

## 2021-03-02 DIAGNOSIS — Z720 Tobacco use: Secondary | ICD-10-CM

## 2021-03-02 DIAGNOSIS — I25118 Atherosclerotic heart disease of native coronary artery with other forms of angina pectoris: Secondary | ICD-10-CM

## 2021-03-02 DIAGNOSIS — J449 Chronic obstructive pulmonary disease, unspecified: Secondary | ICD-10-CM

## 2021-03-02 DIAGNOSIS — E785 Hyperlipidemia, unspecified: Secondary | ICD-10-CM

## 2021-03-02 DIAGNOSIS — I10 Essential (primary) hypertension: Secondary | ICD-10-CM

## 2021-03-02 MED ORDER — METOPROLOL SUCCINATE ER 50 MG PO TB24: 50.0000 mg | ORAL_TABLET | Freq: Every day | ORAL | 2 refills | Status: DC

## 2021-03-02 MED ORDER — FUROSEMIDE 20 MG PO TABS: 20.0000 mg | ORAL_TABLET | ORAL | 5 refills | Status: DC | PRN

## 2021-03-02 MED ORDER — EMPAGLIFLOZIN 10 MG PO TABS: 10.0000 mg | ORAL_TABLET | Freq: Every day | ORAL | 2 refills | Status: DC

## 2021-03-02 MED ORDER — LOSARTAN POTASSIUM 50 MG PO TABS: 50.0000 mg | ORAL_TABLET | Freq: Every day | ORAL | 2 refills | Status: DC

## 2021-03-02 MED ORDER — ROSUVASTATIN CALCIUM 40 MG PO TABS: 40.0000 mg | ORAL_TABLET | Freq: Every day | ORAL | 2 refills | Status: DC

## 2021-03-02 MED ORDER — CLOPIDOGREL BISULFATE 75 MG PO TABS: 75.0000 mg | ORAL_TABLET | Freq: Every day | ORAL | 2 refills | Status: DC

## 2021-03-02 NOTE — Progress Notes (Signed)
Office Visit    Patient Name: Kathryn Garrett Date of Encounter: 03/02/2021  PCP:  Imagene Riches, NP   [facility]  Cardiologist:  Larae Grooms, MD  Advanced Practice Provider:  No care team member to display Electrophysiologist:  None  Chief Complaint    Kathryn Garrett is a 75 y.o. female with a hx of CAD s/p DES to RCA, hypertension, hyperlipidemia, COPD, HFrEF/ICM.  Presents today for follow-up after echocardiogram  Past Medical History    Past Medical History:  Diagnosis Date   Anemia, mild    Arthritis    "qwhere" (02/10/2016)   CAD (coronary artery disease)    a. VF arrest 01/2009/CAD with inferoposterior MI s/p aspiration thrombectomy/BMS of RCA at that time. b. ISR of BMS s/p DES to RCA 06/2010 with moderate LM/LAD disease not significant by FFR. c. s/p angiosculpt PTCA to prox RCA 01/2016 for ISR. d. Aggressive PTCA to prox RCA for ISR 05/2016.   Cardiac arrest ([place]) 01/2009   a. in setting of inf-post STEMI 01/2009 (VF).   Chest pain 06/04/2016   Chronic bronchitis (HCC)    Chronic combined systolic and diastolic CHF (congestive heart failure) ([place])    a. EF 40-45% by cath 2010. b. 60-65% with grade 1 DD by echo 09/2015   COPD (chronic obstructive pulmonary disease) ([place])    Hyperlipidemia 01/28/2016   Hyperlipidemia LDL goal <70 01/28/2016   Hypertension    Lung mass    MI (myocardial infarction) ([place]) 01/2009   Narcotic abuse ([place])    pt now taking Suboxone tid   Pleural effusion on left 09/24/2015   Pneumonia 06/2014; 09/2015   Pre-diabetes    Pulmonary nodule    PVD (peripheral vascular disease) (HCC)    mild atherosclerosis of infrarenal aorta, 25% ostial left renal artery stenosis, 50% ostial right common iliac by cath 2010   S/P PTCA (percutaneous transluminal coronary angioplasty) 02/10/16 to RCA lesion for in stent restenois 02/11/2016   Subclavian artery stenosis (HCC)    a. >50% by duplex 05/2016.   Tobacco abuse    Unstable  angina [facility])    Past Surgical History:  Procedure Laterality Date   ABDOMINAL HYSTERECTOMY     ANKLE FRACTURE SURGERY Right 2000s   CARDIAC CATHETERIZATION N/A 02/10/2016   Procedure: Left Heart Cath and Coronary Angiography;  Surgeon: Jettie Booze, MD;  Location: [place] [facility] LAB;  Service: Cardiovascular;  Laterality: N/A;   CARDIAC CATHETERIZATION N/A 02/10/2016   Procedure: Coronary Balloon Angioplasty;  Surgeon: Jettie Booze, MD;  Location: [place] [facility];  Service: Cardiovascular;  Laterality: N/A;  instent RCA   CARDIAC CATHETERIZATION N/A 06/05/2016   Procedure: Left Heart Cath and Coronary Angiography;  Surgeon: Leonie Man, MD;  Location: [place] [facility];  Service: Cardiovascular;  Laterality: N/A;   CARDIAC CATHETERIZATION N/A 06/05/2016   Procedure: Coronary Balloon Angioplasty;  Surgeon: Leonie Man, MD;  Location: [place] [facility];  Service: Cardiovascular;  Laterality: N/A;   CHEST TUBE INSERTION N/A 10/08/2015   Procedure: PLEURX CATH REMOVAL;  Surgeon: [name], MD;  Location: [facility] ORS;  Service: Thoracic;  Laterality: N/A;   CORONARY ANGIOPLASTY WITH STENT PLACEMENT  01/2009; 2012   FRACTURE SURGERY     RIGHT/LEFT HEART CATH AND CORONARY ANGIOGRAPHY N/A 09/24/2020   Procedure: RIGHT/LEFT HEART CATH AND CORONARY ANGIOGRAPHY;  Surgeon: Nelva Bush, MD;  Location: [place] [facility];  Service: Cardiovascular;  Laterality: N/A;  VIDEO ASSISTED THORACOSCOPY (VATS)/THOROCOTOMY Left 09/29/2015   Procedure: PREOP BRONCHOSCOPY, LEFT THORACOSCOPY, POSSIBLE THORACOTOMY, PLEURAL BIOPSY, TALC;  Surgeon: [name], MD;  Location: [facility] ORS;  Service: General;  Laterality: Left;    Allergies  Allergies  Allergen Reactions   Azithromycin Rash    History of Present Illness    Kathryn Garrett is a 75 y.o. female with a hx of CAD s/p DES to RCA, hypertension, hyperlipidemia, COPD, HFrEF/ICM last seen 11/26/20  Her CAD dates back to inferior MI  treated with bare metal stent to RCA in 2010. In 06/2010 she had DES to proximal to mid RCA for ISR of the BMS. Her left main and LAD disease was moderate and not significant. She was admitted 01/2016 for unstable angina with cardiac cath showing ISR of RCA for which she underwent balloon angioplasty with ISR 75% ? 0% and Plavix was resumed. She had unstable angina again 05/2016 with cardiac cath showing ISR within proximal RCA treated with PTCA with scoring balloon. She had bruit on exam with subsequent carotid doppler showing bilateral ECA stenosis >50% but no significant ICA stenosis.   She had COVID 01/2020 and required oxygen therapy. She was started on Levothyroxine 05/2020 due to hypothyroidism.   She was seen in clinic 07/06/20 doing well from cardiac perspective and no changes were made.  She was hospitalized 09/10/20 with acute hypoxic respiratory failure secondary to COPD exacerbation. Complicated by flash pulmonary edema and NSTEMI requiring mechanical ventillation on 4/30. Echo 09/11/20 LVEF 25-30%, indeterminite diastolic parameters, mild MR. Marland Kitchen She was extubated 09/18/20. She underwent [facility] 09/24/20 showing mild to moderate nonobstructive CAD including  20-35% distal LMCA and 50% ostial LAD disease with FFR not hemodynamicaly significant, prox RCA stent with mild-moderat ICR (30-40%), normal pulmonary pressure, cardiac output/index, filling pressures. Hospital course further complicated by gram negative bacteremia, evaluated by ID and treated with meropenem. Required midodrine during admission for hypotension. She did have SVT while admitted and was discharged on Toprol.   She was seen in follow-up 11/02/2020 with her daughter.  She was staying with her daughter in [place] and receiving home health therapies.  She had quit smoking since hospital discharge.  Her daughter decreased her Toprol dose to daily due to low diastolic blood pressure readings.  She was recommended for repeat echocardiogram to  reassess LVEF  Lab work 10/20/20 K 3.6, creat 0.49, GFR >59, Hb 9.8,  Lab work 10/27/20 Na 144, K 3.1, Cl 106, Glu 87, creat 0.61, GFR >59, Albumin 3.4, AST 12, ALT 11, Ca 8.4, Hb 9.5  Echocardiogram 11/21/2020 LVEF 60 to 65%, no R WMA, mild LVH, grade 1 diastolic dysfunction, RV normal size and function, normal PASP, trivial MR.    Seen in follow up 11/26/20. She was continuing physical therapy, blood pressure well controlled, and edema improved with only PRN usage of Lasix.   ED visit 02/27/21 with elevated blood pressure and palpitations in setting of anxiety. EKG with no acute changes. BP improved to 161 systolic.   Presents today for follow up with her 2 daughters. She is still staying in [place] with them. No recurrent chest pain, pressure, tightness. No recurrent elevated blood pressures. Thinks her previous ED event was due to an anxiety attack. Continuing to walk regularly for exercise without chest pain or exertional dyspnea. Staying with her son in [place] the next couple weeks as she has 2 weddings to attend in the next couple of weeks.yspnea on exertion. Reports no chest pain, pressure, or tightness. No  edema, orthopnea, PND. Reports no palpitations.    EKGs/Labs/Other Studies Reviewed:   The following studies were reviewed today:  Echo 11/24/20  1. Left ventricular ejection fraction, by estimation, is 60 to 65%. The  left ventricle has normal function. The left ventricle has no regional  wall motion abnormalities. There is mild concentric left ventricular  hypertrophy. Left ventricular diastolic  parameters are consistent with Grade I diastolic dysfunction (impaired  relaxation).   2. Right ventricular systolic function is normal. The right ventricular  size is normal. There is normal pulmonary artery systolic pressure. The  estimated right ventricular systolic pressure is 74.1 mmHg.   3. The mitral valve is normal in structure. Trivial mitral valve  regurgitation. No  evidence of mitral stenosis.   4. The aortic valve is tricuspid. Aortic valve regurgitation is not  visualized. No aortic stenosis is present.   5. The inferior vena cava is normal in size with greater than 50%  respiratory variability, suggesting right atrial pressure of 3 mmHg.   Comparison(s): Compared to prior TTE in 08/2020, the LVEF has returned to  normal (60-65% from 25-30%).  L/RHC (09/24/2020): Mild to moderate, non-obstructive coronary artery disease including 20-35% distal LMCA and 50% ostial LAD disease, similar to prior catheterization when FFR was not hemodynamically significant. Patent proximal RCA stents with mild to moderate in-stent restenosis (30-40%). Normal left and right heart filling pressures. Normal pulmonary artery pressures. Normal cardiac output/index.   TTE (09/11/2020):  1. Recommend limited echo with iv contrast agent (definity) for addequate  assessment of LVEF and wall motion.. Left ventricular ejection fraction,  by estimation, is 25 to 30%. The left ventricle has severely decreased  function. Left ventricular  endocardial border not optimally defined to evaluate regional wall motion.  Left ventricular diastolic parameters are indeterminate.   2. Right ventricular systolic function is normal. The right ventricular  size is normal.   3. The mitral valve was not well visualized. Mild mitral valve  regurgitation.   4. The aortic valve was not well visualized. Aortic valve regurgitation  is not visualized.  EKG:  EKG ordered today demonstrated NSR 71 bpm with no acute ST/T wave changes.   Recent Labs: 09/12/2020: B Natriuretic Peptide 970.2 10/02/2020: ALT 28 10/09/2020: Magnesium 2.3 10/12/2020: BUN 18; Creatinine, Ser 0.48; Potassium 4.2; Sodium 138 10/13/2020: Hemoglobin 9.3; Platelets 393  Recent Lipid Panel    Component Value Date/Time   CHOL 107 10/02/2019 1228   CHOL 85 03/20/2013 0557   TRIG 183 (H) 09/15/2020 0552   TRIG 112 03/20/2013 0557    HDL 41 10/02/2019 1228   HDL 21 (L) 03/20/2013 0557   CHOLHDL 2.6 10/02/2019 1228   CHOLHDL 3.5 06/05/2016 0911   VLDL 24 06/05/2016 0911   VLDL 22 03/20/2013 0557   LDLCALC 43 10/02/2019 1228   LDLCALC 42 03/20/2013 0557   Home Medications   Current Meds  Medication Sig   acetaminophen (TYLENOL) 500 MG tablet Take 1,000 mg by mouth every 6 (six) hours as needed for headache.   albuterol (PROVENTIL HFA;VENTOLIN HFA) 108 (90 Base) MCG/ACT inhaler Inhale 2 puffs into the lungs every 4 (four) hours as needed for wheezing or shortness of breath.   aspirin EC 81 MG tablet Take 81 mg by mouth at bedtime.    azelastine (ASTELIN) 0.1 % nasal spray USE 2 SPRAYS BID UNTIL DIRECTED TO STOP. USE FOR RUNNY NOSE   Buprenorphine HCl-Naloxone HCl 8-2 MG FILM Place 0.5 Film under the tongue daily.  cetirizine (ZYRTEC) 10 MG tablet Take 1 tablet (10 mg total) by mouth daily.   clopidogrel (PLAVIX) 75 MG tablet TAKE 1 TABLET(75 MG) BY MOUTH AT BEDTIME   empagliflozin (JARDIANCE) 10 MG TABS tablet Take 1 tablet (10 mg total) by mouth daily.   fluticasone (FLONASE) 50 MCG/ACT nasal spray Place 1 spray into both nostrils daily.   food thickener (THICK IT) POWD Use as needed for honey thick liquids   furosemide (LASIX) 20 MG tablet Take 1 tablet (20 mg total) by mouth as needed for fluid or edema.   levothyroxine (SYNTHROID) 88 MCG tablet Take 88 mcg by mouth every morning.   losartan (COZAAR) 50 MG tablet Take 1 tablet (50 mg total) by mouth daily.   metoprolol succinate (TOPROL-XL) 50 MG 24 hr tablet Take 1 tablet (50 mg total) by mouth daily. Take with or immediately following a meal.   nitroGLYCERIN (NITROSTAT) 0.4 MG SL tablet Place 1 tablet (0.4 mg total) under the tongue every 5 (five) minutes as needed for chest pain.   pantoprazole (PROTONIX) 40 MG tablet Take 1 tablet (40 mg total) by mouth daily. Further refills per PCP.   Potassium Chloride ER 20 MEQ TBCR Take 1 tablet by mouth daily.    rosuvastatin (CRESTOR) 40 MG tablet Take 1 tablet (40 mg total) by mouth at bedtime.   TRELEGY ELLIPTA 100-62.5-25 MCG/INH AEPB Take 1 puff by mouth daily.   triamcinolone cream (KENALOG) 0.1 % Apply topically 2 (two) times daily. to affected area    Review of Systems    All other systems reviewed and are otherwise negative except as noted above.  Physical Exam    VS:  BP 112/64   Ht 4\' 11"  (1.499 m)   Wt 135 lb 6.4 oz (61.4 kg)   SpO2 97%   BMI 27.35 kg/m  , BMI Body mass index is 27.35 kg/m.  Wt Readings from Last 3 Encounters:  03/02/21 135 lb 6.4 oz (61.4 kg)  11/26/20 129 lb (58.5 kg)  11/02/20 129 lb (58.5 kg)     GEN: Well nourished, well developed, in no acute distress. HEENT: normal. Neck: Supple, no JVD, carotid bruits, or masses. Cardiac: RRR, no murmurs, rubs, or gallops. No clubbing, cyanosis, edema.  Radials/DP/PT 2+ and equal bilaterally.  Respiratory:  Respirations regular and unlabored, clear to auscultation bilaterally. GI: Soft, nontender, nondistended. MS: No deformity or atrophy. Skin: Warm and dry, no rash. Neuro:  Strength and sensation are intact. Psych: Normal affect.  Assessment & Plan    SVT / Sinus tachycardia -No palpitations. Continue Toprol 50mg  daily.   HTN -BP well controlled. Continue current antihypertensive regimen.  She will report if BP greater than 130/80.    CAD s/p DES to RCA - RCA initially with BMS with ISR requiring DES and subsequent balloon angioplasty. LHC 09/2020 mild to moderate nonobstructive disease of distal LMCA and LAD with patent prox RCA stent mild to moderate ISR (30-40%). Stable with no anginal symptoms. No indication for ischemic evaluation. GDMT includes aspirin, crestor, toprol, jardiance, plavix. In setting of recurrent ISR would recommend continuation of DAPT. Heart healthy diet and regular cardiovascular exercise encouraged.  Discussed participating in [facility] program when she returns to her home.  Chronic  systolic and diastolic heart failure with recovered LVEF./ Ischemic cardiomyopathy -Euvolemic and well compensated on exam.  Daily weight at home has been stable.  LVEF by echo 09/11/2020 of 25-30% in the setting of NSTEMI, heart failure exacerbation, bacteremia, complicated hospitalization.  Echocardiogram 11/2020 showed LVEF improved to 27-12%, grade 1 diastolic dysfunction, mild LVH, no significant valvular abnormality. GDMT includes Jardiance 10 mg daily, Toprol 50 mg daily, losartan 50 mg daily, Lasix 20 mg as needed.  To keep heart pumping function strong she prefers to continue her current medications agreement with this plan.  Low-salt diet, fluid restriction encouraged.  Refills provided.  Hypokalemia - Following with primary care. 02/26/21 ED K 3.9.  HLD, LDL goal <70 - 10/02/19 LDL 43. Continue Crestor 40mg  QD. Direct LDL, LFT ordered today.   Tobacco use -has not smoked since hospitalization.  Continue cessation encouraged.  Encouraged to utilize 1-800 quit now. Offered referral to Elias Else, PsyD for smoking cessation coping resources which she will consider.   GERD - Continue Protonix.   COPD - No signs of acute exacerbations. Follow with PCP.   Disposition: Follow up in 3 month(s) with Dr. Christ Kick or APP.    Signed, Loel Dubonnet, NP 03/02/2021, 11:38 AM [place]

## 2021-03-02 NOTE — Patient Instructions (Signed)
Medication Instructions:  Continue your current medications.   *If you need a refill on your cardiac medications before your next appointment, please call your pharmacy*   Lab Work: Your physician recommends that you return for lab work today for lipid and liver panel  If you have labs (blood work) drawn today and your tests are completely normal, you will receive your results only by: [place] (if you have [place]) OR A paper copy in the mail If you have any lab test that is abnormal or we need to change your treatment, we will call you to review the results.   Testing/Procedures: Your EKG today showed normal sinus rhythm which is a great result!  Follow-Up: At [facility], you and your health needs are our priority.  As part of our continuing mission to provide you with exceptional heart care, we have created designated Provider Care Teams.  These Care Teams include your primary Cardiologist (physician) and Advanced Practice Providers (APPs -  Physician Assistants and Nurse Practitioners) who all work together to provide you with the care you need, when you need it.  We recommend signing up for the patient portal called "MyChart".  Sign up information is provided on this After Visit Summary.  MyChart is used to connect with patients for Virtual Visits (Telemedicine).  Patients are able to view lab/test results, encounter notes, upcoming appointments, etc.  Non-urgent messages can be sent to your provider as well.   To learn more about what you can do with MyChart, go to [email or web address].    Your next appointment:   3-4 month(s)  The format for your next appointment:   In Person  Provider:   You may see Larae Grooms, MD or one of the following Advanced Practice Providers on your designated Care Team:   Melina Copa, PA-C Ermalinda Barrios, PA-C Loel Dubonnet, NP    Other Instructions  Heart Healthy Diet Recommendations: A low-salt diet is recommended. Meats  should be grilled, baked, or boiled. Avoid fried foods. Focus on lean protein sources like fish or chicken with vegetables and fruits. The American Heart Association is a Microbiologist!  Exercise recommendations: The American Heart Association recommends 150 minutes of moderate intensity exercise weekly. Try 30 minutes of moderate intensity exercise 4-5 times per week. This could include walking, jogging, or swimming.   Managing the Challenge of Quitting Smoking Quitting smoking is a physical and mental challenge. You will face cravings, withdrawal symptoms, and temptation. Before quitting, work with your health care provider to make a plan that can help you manage quitting. Preparation can help you quit and keep you from giving in. How to manage lifestyle changes Managing stress Stress can make you want to smoke, and wanting to smoke may cause stress. It is important to find ways to manage your stress. You might try some of the following: Practice relaxation techniques. Breathe slowly and deeply, in through your nose and out through your mouth. Listen to music. Soak in a bath or take a shower. Imagine a peaceful place or vacation. Get some support. Talk with family or friends about your stress. Join a support group. Talk with a counselor or therapist. Get some physical activity. Go for a walk, run, or bike ride. Play a favorite sport. Practice yoga.  Medicines Talk with your health care provider about medicines that might help you deal with cravings and make quitting easier for you. Relationships Social situations can be difficult when you are quitting smoking. To manage this, you  can: Avoid parties and other social situations where people might be smoking. Avoid alcohol. Leave right away if you have the urge to smoke. Explain to your family and friends that you are quitting smoking. Ask for support and let them know you might be a bit grumpy. Plan activities where smoking is not  an option. General instructions Be aware that many people gain weight after they quit smoking. However, not everyone does. To keep from gaining weight, have a plan in place before you quit and stick to the plan after you quit. Your plan should include: Having healthy snacks. When you have a craving, it may help to: Eat popcorn, carrots, celery, or other cut vegetables. Chew sugar-free gum. Changing how you eat. Eat small portion sizes at meals. Eat 4-6 small meals throughout the day instead of 1-2 large meals a day. Be mindful when you eat. Do not watch television or do other things that might distract you as you eat. Exercising regularly. Make time to exercise each day. If you do not have time for a long workout, do short bouts of exercise for 5-10 minutes several times a day. Do some form of strengthening exercise, such as weight lifting. Do some exercise that gets your heart beating and causes you to breathe deeply, such as walking fast, running, swimming, or biking. This is very important. Drinking plenty of water or other low-calorie or no-calorie drinks. Drink 6-8 glasses of water daily.  How to recognize withdrawal symptoms Your body and mind may experience discomfort as you try to get used to not having nicotine in your system. These effects are called withdrawal symptoms. They may include: Feeling hungrier than normal. Having trouble concentrating. Feeling irritable or restless. Having trouble sleeping. Feeling depressed. Craving a cigarette. To manage withdrawal symptoms: Avoid places, people, and activities that trigger your cravings. Remember why you want to quit. Get plenty of sleep. Avoid coffee and other caffeinated drinks. These may worsen some of your symptoms. These symptoms may surprise you. But be assured that they are normal to have when quitting smoking. How to manage cravings Come up with a plan for how to deal with your cravings. The plan should include the  following: A definition of the specific situation you want to deal with. An alternative action you will take. A clear idea for how this action will help. The name of someone who might help you with this. Cravings usually last for 5-10 minutes. Consider taking the following actions to help you with your plan to deal with cravings: Keep your mouth busy. Chew sugar-free gum. Suck on hard candies or a straw. Brush your teeth. Keep your hands and body busy. Change to a different activity right away. Squeeze or play with a ball. Do an activity or a hobby, such as making bead jewelry, practicing needlepoint, or working with wood. Mix up your normal routine. Take a short exercise break. Go for a quick walk or run up and down stairs. Focus on doing something kind or helpful for someone else. Call a friend or family member to talk during a craving. Join a support group. Contact a quitline. Where to find support To get help or find a support group: Call the [facility]'s Smoking Quitline: 1-800-QUIT NOW (337)701-1934) Visit the website of the Substance Abuse and [place]: ktimeonline.com Text QUIT to SmokefreeTXT: 253664 Where to find more information Visit these websites to find more information on quitting smoking: [place]: www.smokefree.gov American Lung Association: www.lung.org  American Cancer Society: www.cancer.org Centers for Disease Control and Prevention: http://www.wolf.info/ American Heart Association: www.heart.org Contact a health care provider if: You want to change your plan for quitting. The medicines you are taking are not helping. Your eating feels out of control or you cannot sleep. Get help right away if: You feel depressed or become very anxious. Summary Quitting smoking is a physical and mental challenge. You will face cravings, withdrawal symptoms, and temptation to smoke again. Preparation can help you as you go  through these challenges. Try different techniques to manage stress, handle social situations, and prevent weight gain. You can deal with cravings by keeping your mouth busy (such as by chewing gum), keeping your hands and body busy, calling family or friends, or contacting a quitline for people who want to quit smoking. You can deal with withdrawal symptoms by avoiding places where people smoke, getting plenty of rest, and avoiding drinks with caffeine. This information is not intended to replace advice given to you by your health care provider. Make sure you discuss any questions you have with your health care provider. Document Revised: 02/18/2019 Document Reviewed: 02/18/2019 Elsevier Patient Education  [place].

## 2021-03-03 ENCOUNTER — Telehealth (HOSPITAL_BASED_OUTPATIENT_CLINIC_OR_DEPARTMENT_OTHER): Payer: Self-pay | Admitting: Family

## 2021-03-03 NOTE — Telephone Encounter (Signed)
Left message for patient to call and schedule 3-4 month follow up with Dr, Irish Lack / Laurann Montana, NP

## 2021-03-08 NOTE — Telephone Encounter (Signed)
Spoke with patient regarding 3-4 month folow up appt with Laurann Montana, NP---Thursday 06/09/21 at 1:30 pm.  Germaine [place] mail information to patient and she voiced her understanding.

## 2021-03-10 ENCOUNTER — Emergency Department (HOSPITAL_COMMUNITY): Payer: Medicare HMO

## 2021-03-10 ENCOUNTER — Emergency Department (HOSPITAL_COMMUNITY)
Admission: EM | Admit: 2021-03-10 | Discharge: 2021-03-10 | Disposition: A | Discharge status: Home / Self Care | Payer: Medicare HMO | Attending: Emergency Medicine | Admitting: Emergency Medicine

## 2021-03-10 DIAGNOSIS — R000 Tachycardia, unspecified: Secondary | ICD-10-CM | POA: Insufficient documentation

## 2021-03-10 DIAGNOSIS — Z79899 Other long term (current) drug therapy: Secondary | ICD-10-CM | POA: Diagnosis not present

## 2021-03-10 DIAGNOSIS — I5042 Chronic combined systolic (congestive) and diastolic (congestive) heart failure: Secondary | ICD-10-CM | POA: Insufficient documentation

## 2021-03-10 DIAGNOSIS — I110 Hypertensive heart disease with heart failure: Secondary | ICD-10-CM | POA: Insufficient documentation

## 2021-03-10 DIAGNOSIS — Z7982 Long term (current) use of aspirin: Secondary | ICD-10-CM | POA: Diagnosis not present

## 2021-03-10 DIAGNOSIS — R0602 Shortness of breath: Secondary | ICD-10-CM | POA: Diagnosis present

## 2021-03-10 DIAGNOSIS — B370 Candidal stomatitis: Secondary | ICD-10-CM

## 2021-03-10 DIAGNOSIS — Z7951 Long term (current) use of inhaled steroids: Secondary | ICD-10-CM | POA: Insufficient documentation

## 2021-03-10 DIAGNOSIS — Z87891 Personal history of nicotine dependence: Secondary | ICD-10-CM | POA: Diagnosis not present

## 2021-03-10 DIAGNOSIS — Z7984 Long term (current) use of oral hypoglycemic drugs: Secondary | ICD-10-CM | POA: Diagnosis not present

## 2021-03-10 DIAGNOSIS — I2510 Atherosclerotic heart disease of native coronary artery without angina pectoris: Secondary | ICD-10-CM | POA: Diagnosis not present

## 2021-03-10 DIAGNOSIS — J449 Chronic obstructive pulmonary disease, unspecified: Secondary | ICD-10-CM | POA: Diagnosis not present

## 2021-03-10 DIAGNOSIS — U071 COVID-19: Secondary | ICD-10-CM | POA: Diagnosis not present

## 2021-03-10 DIAGNOSIS — R0902 Hypoxemia: Secondary | ICD-10-CM

## 2021-03-10 LAB — COMPREHENSIVE METABOLIC PANEL
ALT: 16 U/L (ref 0–44)
AST: 26 U/L (ref 15–41)
Albumin: 4 g/dL (ref 3.5–5.0)
Alkaline Phosphatase: 70 U/L (ref 38–126)
Anion gap: 11 (ref 5–15)
BUN: 21 mg/dL (ref 8–23)
CO2: 22 mmol/L (ref 22–32)
Calcium: 8.8 mg/dL — ABNORMAL LOW (ref 8.9–10.3)
Chloride: 102 mmol/L (ref 98–111)
Creatinine, Ser: 0.95 mg/dL (ref 0.44–1.00)
GFR, Estimated: >60 mL/min (ref >60)
Glucose, Bld: 84 mg/dL (ref 70–99)
Potassium: 3.7 mmol/L (ref 3.5–5.1)
Sodium: 135 mmol/L (ref 135–145)
Total Bilirubin: 0.7 mg/dL (ref 0.3–1.2)
Total Protein: 7.7 g/dL (ref 6.5–8.1)

## 2021-03-10 LAB — CBC WITH DIFFERENTIAL/PLATELET
Abs Immature Granulocytes: 0.04 10*3/uL (ref 0.00–0.07)
Basophils Absolute: 0 10*3/uL (ref 0.0–0.1)
Basophils Relative: 0 %
Eosinophils Absolute: 0 10*3/uL (ref 0.0–0.5)
Eosinophils Relative: 0 %
HCT: 41.3 % (ref 36.0–46.0)
Hemoglobin: 13.1 g/dL (ref 12.0–15.0)
Immature Granulocytes: 0 %
Lymphocytes Relative: 16 %
Lymphs Abs: 1.5 10*3/uL (ref 0.7–4.0)
MCH: 29.4 pg (ref 26.0–34.0)
MCHC: 31.7 g/dL (ref 30.0–36.0)
MCV: 92.8 fL (ref 80.0–100.0)
Monocytes Absolute: 1.1 10*3/uL — ABNORMAL HIGH (ref 0.1–1.0)
Monocytes Relative: 12 %
Neutro Abs: 6.7 10*3/uL (ref 1.7–7.7)
Neutrophils Relative %: 72 %
Platelets: 251 10*3/uL (ref 150–400)
RBC: 4.45 MIL/uL (ref 3.87–5.11)
RDW: 14 % (ref 11.5–15.5)
WBC: 9.3 10*3/uL (ref 4.0–10.5)
nRBC: 0 % (ref 0.0–0.2)

## 2021-03-10 LAB — RESP PANEL BY RT-PCR (FLU A&B, COVID) ARPGX2
Influenza A by PCR: NEGATIVE
Influenza B by PCR: NEGATIVE
SARS Coronavirus 2 by RT PCR: POSITIVE — AB

## 2021-03-10 LAB — LACTIC ACID, PLASMA: Lactic Acid, Venous: 2.9 mmol/L (ref 0.5–1.9)

## 2021-03-10 IMAGING — CR DG CHEST 2V
2 series · 2 of 2 positions shown · non-contrast
Comparison: Compared with report for previous study done on
[DATE].

CLINICAL DATA: Cough, shortness of breath

EXAM:
CHEST - 2 VIEW

[w chest pa]
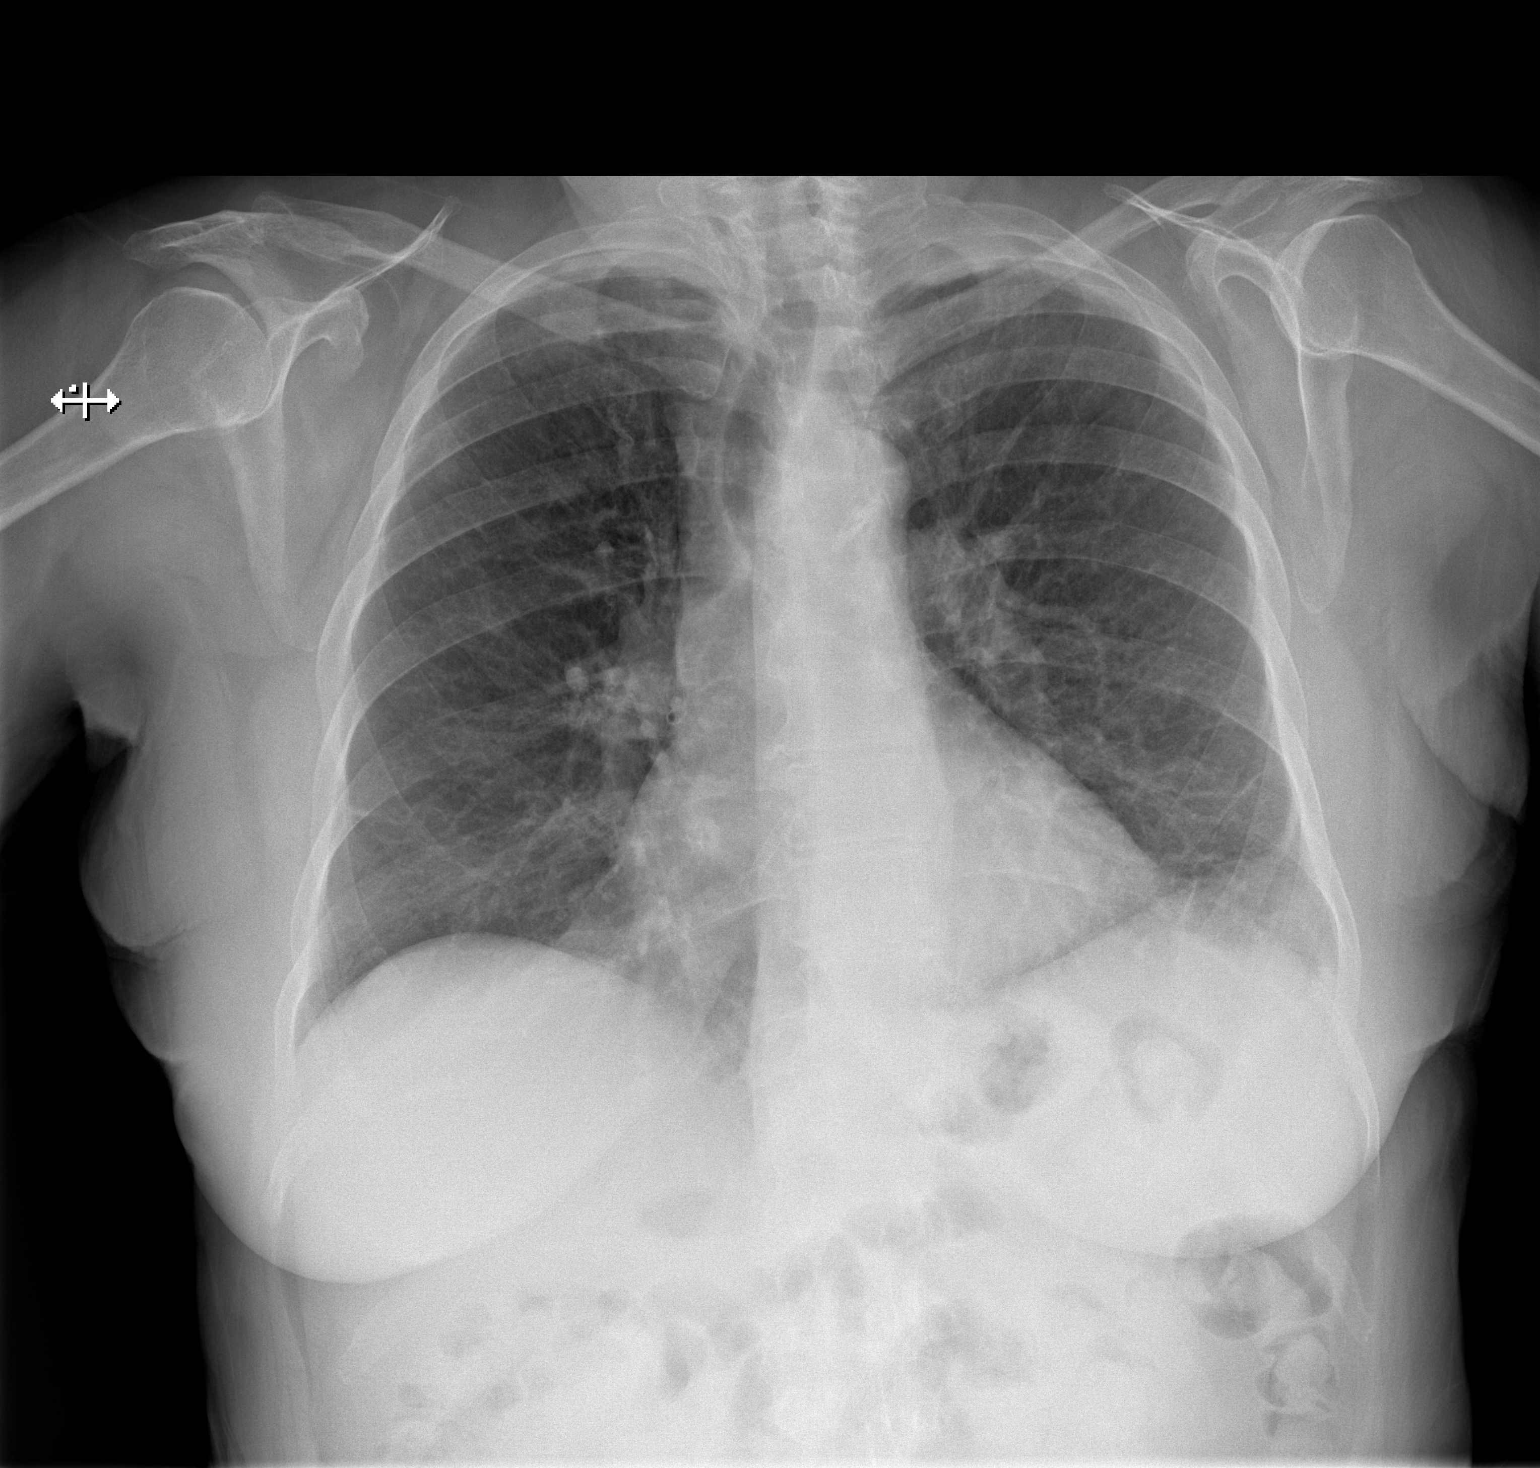

[w chest lat]
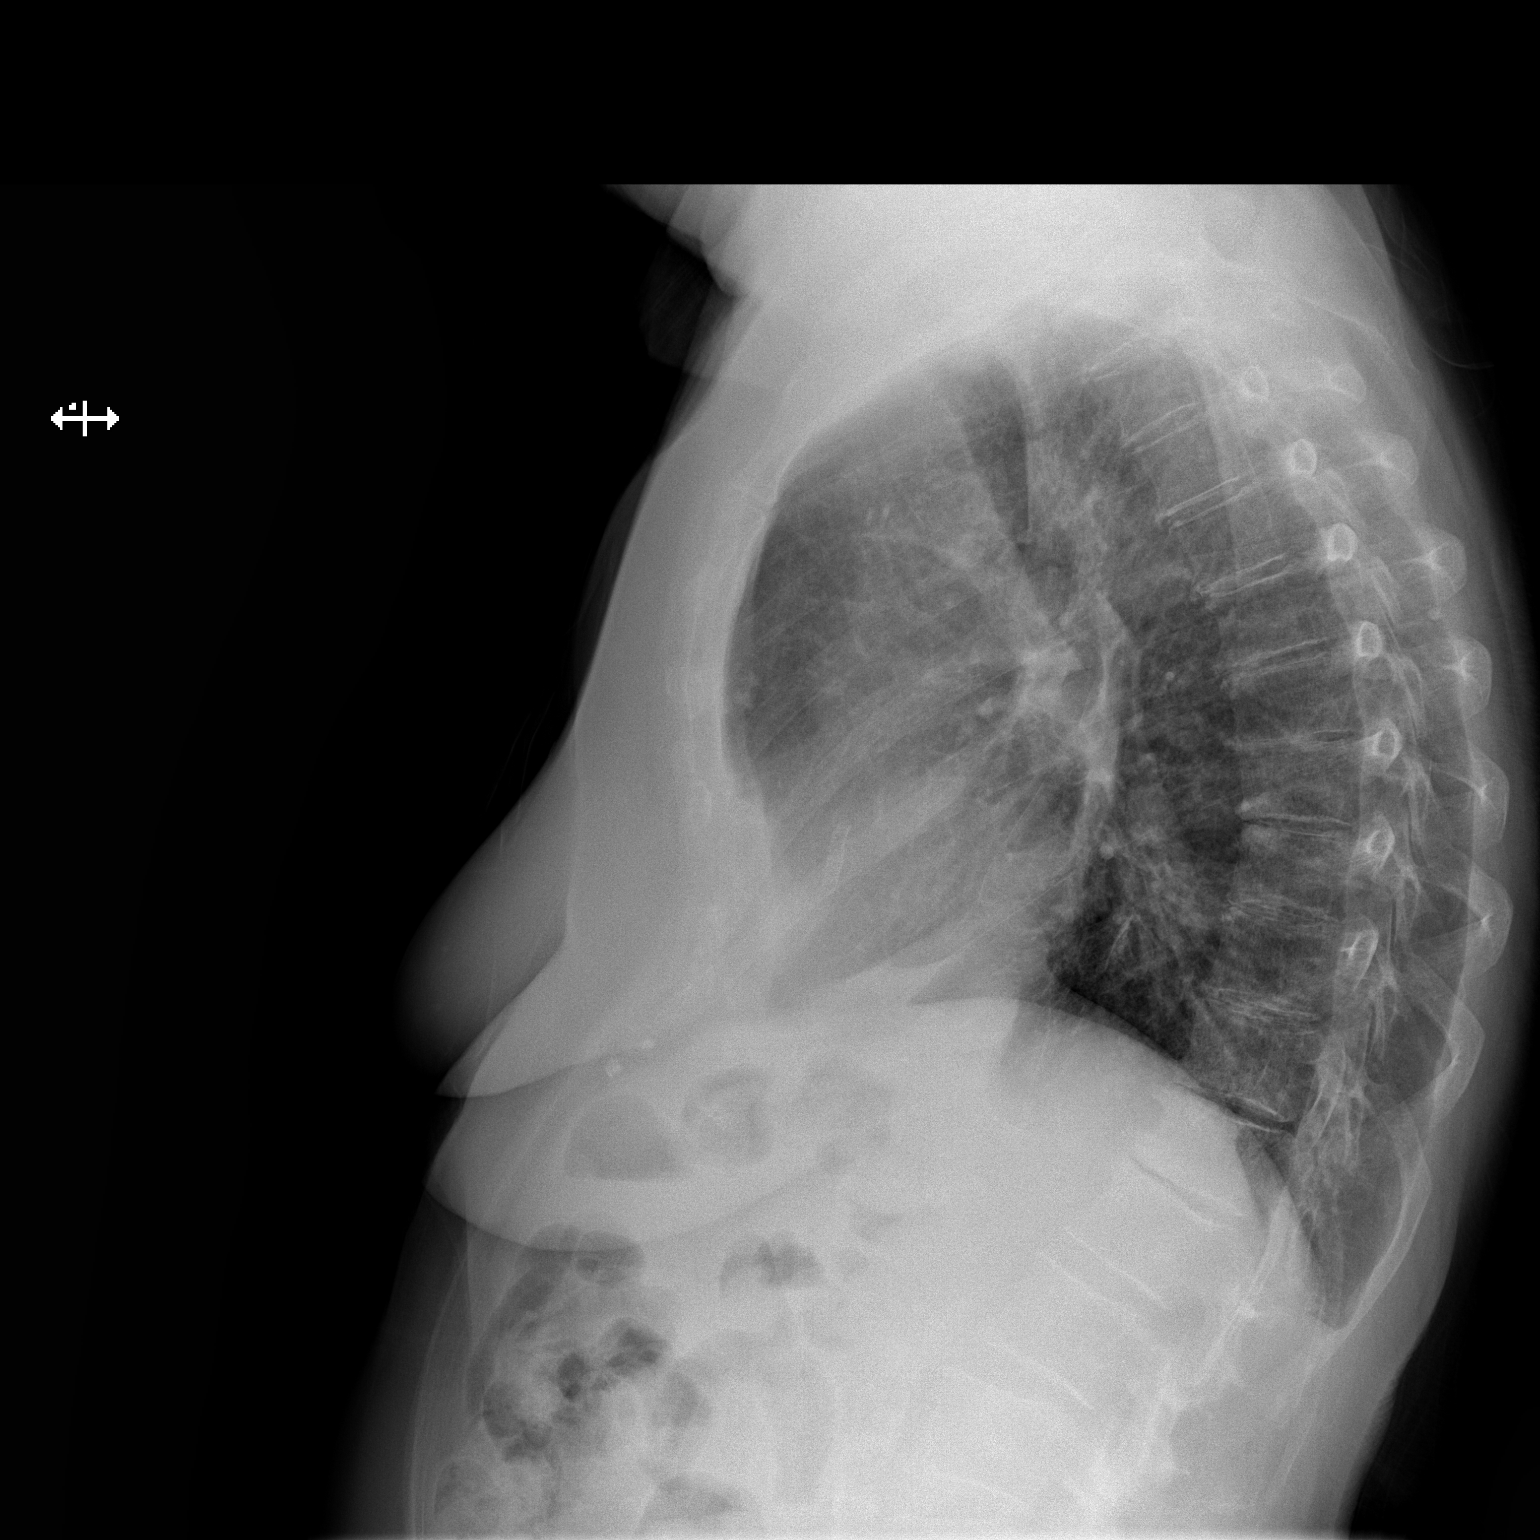

[2 of 2 positions shown; findings below may reference images not displayed]

Due to technical difficulties, images of the previous
study not available for comparison at the time of this dictation.
FINDINGS: Transverse diameter of heart is within normal limits. Possible
coronary artery stent is noted. Mediastinum is unremarkable. There
are no signs of pulmonary edema or focal pulmonary consolidation.
Small linear densities are seen in the lower lung fields, possibly
suggesting scarring. Left hemidiaphragm is elevated. Left lateral CP
angle is indistinct. Posterior costophrenic angles are clear. There
is no pneumothorax. Pleural thickening is seen in the apices, more
so on the left side.
IMPRESSION: There are no signs of pulmonary edema or focal pulmonary
consolidation. Small linear densities in the lower lung fields and
blunting of left lateral CP angle may suggest scarring.

Reading location: [place], [place].

## 2021-03-10 MED ORDER — NIRMATRELVIR/RITONAVIR (PAXLOVID)TABLET: 3.0000 | ORAL_TABLET | Freq: Two times a day (BID) | ORAL | Status: DC

## 2021-03-10 MED ORDER — NYSTATIN 100000 UNIT/ML MT SUSP: 500000.0000 [IU] | Freq: Four times a day (QID) | OROMUCOSAL | 0 refills | Status: DC

## 2021-03-10 MED ORDER — NIRMATRELVIR/RITONAVIR (PAXLOVID) TABLET (RENAL DOSING)
2.0000 | ORAL_TABLET | Freq: Two times a day (BID) | ORAL | Status: DC
  Administered 2021-03-10: 2 via ORAL
  Filled 2021-03-10: qty 20

## 2021-03-10 MED ORDER — ACETAMINOPHEN 500 MG PO TABS
1000.0000 mg | ORAL_TABLET | Freq: Once | ORAL | Status: AC
  Administered 2021-03-10: 1000 mg via ORAL
  Filled 2021-03-10: qty 2

## 2021-03-10 NOTE — ED Notes (Signed)
Pt's O2 turned off to evaluate room air sats

## 2021-03-10 NOTE — ED Triage Notes (Signed)
Pt BIBA from home. Pt c/o has had productive cough since last night, along with SHOB. Pt was satting 89% on RA. Pt was satting 96% on 2L [place].  Pt had PNA in May and was tubed at that time. Pt has rhonchi.   150/88 90HR

## 2021-03-10 NOTE — ED Provider Notes (Signed)
[facility] DEPT Provider Note   CSN: [id number] Arrival date & time: 03/10/21  1019     History Chief Complaint  Patient presents with   Cough   Shortness of Breath    Kathryn Garrett is a 75 y.o. female.  Patient is a 75 year old female with a history of CAD status post V. fib arrest, COPD, CHF, hypertension, prior narcotic abuse currently on Suboxone, PVD and prior tobacco abuse she quit in May 2022 who is presenting today via EMS due to complaints of worsening cough and shortness of breath.  Patient reported 4 days prior to arrival she started to have cough, general malaise and temperature of 99.  Symptoms have gradually worsened and before today the cough has been dry but now she is having yellow mucus production.  She has not had any blood in her sputum.  She is also had worsening shortness of breath since last night.  She does use inhalers at home but had not used them this morning.  She has no chest pain or abdominal pain.  No nausea vomiting or diarrhea.  She has still been eating and drinking.  She has not noticed any swelling in her lower extremities.  She has been taking her medications as prescribed.  She has not received a COVID or flu vaccine at any point in time.  No known sick contacts.  Patient did call EMS today and when they arrived patient was satting 89% on room air which improved to 96% on 2 L.  Patient does not wear oxygen at home.  The history is provided by the patient.  Cough Associated symptoms: shortness of breath   Shortness of Breath Associated symptoms: cough       Past Medical History:  Diagnosis Date   Anemia, mild    Arthritis    "qwhere" (02/10/2016)   CAD (coronary artery disease)    a. VF arrest 01/2009/CAD with inferoposterior MI s/p aspiration thrombectomy/BMS of RCA at that time. b. ISR of BMS s/p DES to RCA 06/2010 with moderate LM/LAD disease not significant by FFR. c. s/p angiosculpt PTCA to prox RCA 01/2016 for  ISR. d. Aggressive PTCA to prox RCA for ISR 05/2016.   Cardiac arrest ([place]) 01/2009   a. in setting of inf-post STEMI 01/2009 ([facility]).   Chest pain 06/04/2016   Chronic bronchitis (HCC)    Chronic combined systolic and diastolic CHF (congestive heart failure) ([place])    a. EF 40-45% by cath 2010. b. 60-65% with grade 1 DD by echo 09/2015   COPD (chronic obstructive pulmonary disease) ([place])    Hyperlipidemia 01/28/2016   Hyperlipidemia LDL goal <70 01/28/2016   Hypertension    Lung mass    MI (myocardial infarction) ([place]) 01/2009   Narcotic abuse ([place])    pt now taking Suboxone tid   Pleural effusion on left 09/24/2015   Pneumonia 06/2014; 09/2015   Pre-diabetes    Pulmonary nodule    PVD (peripheral vascular disease) (HCC)    mild atherosclerosis of infrarenal aorta, 25% ostial left renal artery stenosis, 50% ostial right common iliac by cath 2010   S/P PTCA (percutaneous transluminal coronary angioplasty) 02/10/16 to RCA lesion for in stent restenois 02/11/2016   Subclavian artery stenosis (HCC)    a. >50% by duplex 05/2016.   Tobacco abuse    Unstable angina [facility])     Patient Active Problem List   Diagnosis Date Noted   SVT (supraventricular tachycardia) (HCC)    Acute combined  systolic and diastolic heart failure (HCC)    Pure hypercholesterolemia    Acute on chronic HFrEF (heart failure with reduced ejection fraction) (HCC)    Acute respiratory failure ([place]) 09/11/2020   HFrEF (heart failure with reduced ejection fraction) (HCC)    Acute exacerbation of chronic obstructive pulmonary disease (COPD) ([place]) 09/10/2020   Severe sepsis ([place]) 09/10/2020   NSTEMI (non-ST elevated myocardial infarction) ([place]) 09/10/2020   Hyperglycemia 09/10/2020   SOB (shortness of breath) 09/10/2020   Chronic obstructive pulmonary disease ([place]) 03/05/2018   History of opioid abuse ([place]) 03/05/2018   Nocturnal hypoxemia 10/30/2017   Peripheral arterial disease ([place]) 08/30/2016   Chest pain 06/04/2016   S/P  PTCA (percutaneous transluminal coronary angioplasty) 02/10/16 to RCA lesion for in stent restenois 02/11/2016   Unstable angina (HCC)    Hyperlipidemia LDL goal <70 01/28/2016   Presence of coronary angioplasty implant and graft 10/15/2015   Tobacco abuse 10/15/2015   CAP (community acquired pneumonia) 09/24/2015   CAD in native artery 09/24/2015   HTN (hypertension) 09/24/2015   Lung nodule, solitary 03/31/2014   Liver nodule 03/31/2014    Past Surgical History:  Procedure Laterality Date   ABDOMINAL HYSTERECTOMY     ANKLE FRACTURE SURGERY Right 2000s   CARDIAC CATHETERIZATION N/A 02/10/2016   Procedure: Left Heart Cath and Coronary Angiography;  Surgeon: Jettie Booze, MD;  Location: [facility] LAB;  Service: Cardiovascular;  Laterality: N/A;   CARDIAC CATHETERIZATION N/A 02/10/2016   Procedure: Coronary Balloon Angioplasty;  Surgeon: Jettie Booze, MD;  Location: [facility] LAB;  Service: Cardiovascular;  Laterality: N/A;  instent RCA   CARDIAC CATHETERIZATION N/A 06/05/2016   Procedure: Left Heart Cath and Coronary Angiography;  Surgeon: Leonie Man, MD;  Location: [facility];  Service: Cardiovascular;  Laterality: N/A;   CARDIAC CATHETERIZATION N/A 06/05/2016   Procedure: Coronary Balloon Angioplasty;  Surgeon: Leonie Man, MD;  Location: [facility];  Service: Cardiovascular;  Laterality: N/A;   CHEST TUBE INSERTION N/A 10/08/2015   Procedure: PLEURX CATH REMOVAL;  Surgeon: [name], MD;  Location: [facility] ORS;  Service: Thoracic;  Laterality: N/A;   CORONARY ANGIOPLASTY WITH STENT PLACEMENT  01/2009; 2012   FRACTURE SURGERY     RIGHT/LEFT HEART CATH AND CORONARY ANGIOGRAPHY N/A 09/24/2020   Procedure: RIGHT/LEFT HEART CATH AND CORONARY ANGIOGRAPHY;  Surgeon: Nelva Bush, MD;  Location: [facility];  Service: Cardiovascular;  Laterality: N/A;   VIDEO ASSISTED THORACOSCOPY (VATS)/THOROCOTOMY Left 09/29/2015   Procedure: PREOP  BRONCHOSCOPY, LEFT THORACOSCOPY, POSSIBLE THORACOTOMY, PLEURAL BIOPSY, TALC;  Surgeon: [name], MD;  Location: [facility] ORS;  Service: General;  Laterality: Left;     OB History   No obstetric history on file.     Family History  Problem Relation Age of Onset   Coronary artery disease Father    Hyperlipidemia Father    Early death Father    Heart attack Father    Coronary artery disease Mother    Hyperlipidemia Mother    Heart attack Mother    Cancer Brother     Social History   Tobacco Use   Smoking status: Former    Packs/day: 0.50    Years: 56.00    Pack years: 28.00    Types: Cigarettes    Quit date: 09/10/2020    Years since quitting: 0.4   Smokeless tobacco: Never  Vaping Use   Vaping Use: Never used  Substance Use Topics   Alcohol  use: No    Alcohol/week: 0.0 standard drinks   Drug use: No    Home Medications Prior to Admission medications   Medication Sig Start Date End Date Taking? Authorizing Provider  acetaminophen (TYLENOL) 500 MG tablet Take 1,000 mg by mouth every 6 (six) hours as needed for headache.    [provider]  albuterol (PROVENTIL HFA;VENTOLIN HFA) 108 (90 Base) MCG/ACT inhaler Inhale 2 puffs into the lungs every 4 (four) hours as needed for wheezing or shortness of breath.    [provider]  aspirin EC 81 MG tablet Take 81 mg by mouth at bedtime.     [provider]  azelastine (ASTELIN) 0.1 % nasal spray USE 2 SPRAYS BID UNTIL DIRECTED TO STOP. USE FOR RUNNY NOSE 08/14/17   [provider]  Buprenorphine HCl-Naloxone HCl 8-2 MG FILM Place 0.5 Film under the tongue daily.    [provider]  cetirizine (ZYRTEC) 10 MG tablet Take 1 tablet (10 mg total) by mouth daily. 03/11/18   Waldon Merl, PA-C  clopidogrel (PLAVIX) 75 MG tablet Take 1 tablet (75 mg total) by mouth daily. 03/02/21   Loel Dubonnet, NP  empagliflozin (JARDIANCE) 10 MG TABS tablet Take 1 tablet (10 mg total) by mouth daily.  03/02/21   Loel Dubonnet, NP  fluticasone (FLONASE) 50 MCG/ACT nasal spray Place 1 spray into both nostrils daily.    [provider]  food thickener (THICK IT) POWD Use as needed for honey thick liquids 10/12/20   Kathie Dike, MD  furosemide (LASIX) 20 MG tablet Take 1 tablet (20 mg total) by mouth as needed for fluid or edema. 03/02/21   Loel Dubonnet, NP  levothyroxine (SYNTHROID) 88 MCG tablet Take 88 mcg by mouth every morning. 05/26/20   [provider]  losartan (COZAAR) 50 MG tablet Take 1 tablet (50 mg total) by mouth daily. 03/02/21 11/27/21  Loel Dubonnet, NP  metoprolol succinate (TOPROL-XL) 50 MG 24 hr tablet Take 1 tablet (50 mg total) by mouth daily. Take with or immediately following a meal. 03/02/21   Loel Dubonnet, NP  nitroGLYCERIN (NITROSTAT) 0.4 MG SL tablet Place 1 tablet (0.4 mg total) under the tongue every 5 (five) minutes as needed for chest pain. 11/02/20 03/02/21  Loel Dubonnet, NP  pantoprazole (PROTONIX) 40 MG tablet Take 1 tablet (40 mg total) by mouth daily. Further refills per PCP. 11/02/20   Loel Dubonnet, NP  Potassium Chloride ER 20 MEQ TBCR Take 1 tablet by mouth daily. 10/28/20   [provider]  rosuvastatin (CRESTOR) 40 MG tablet Take 1 tablet (40 mg total) by mouth at bedtime. 03/02/21   Loel Dubonnet, NP  TRELEGY ELLIPTA 100-62.5-25 MCG/INH AEPB Take 1 puff by mouth daily. 08/13/20   [provider]  triamcinolone cream (KENALOG) 0.1 % Apply topically 2 (two) times daily. to affected area 08/11/20   [provider]    Allergies    Azithromycin  Review of Systems   Review of Systems  Respiratory:  Positive for cough and shortness of breath.   All other systems reviewed and are negative.  Physical Exam Updated Vital Signs There were no vitals taken for this visit.  Physical Exam Vitals and nursing note reviewed.  Constitutional:      General: She is not in acute distress.     Appearance: She is well-developed.  HENT:     Head: Normocephalic and atraumatic.  Eyes:  Pupils: Pupils are equal, round, and reactive to light.  Cardiovascular:     Rate and Rhythm: Regular rhythm. Tachycardia present.     Heart sounds: Normal heart sounds. No murmur heard.   No friction rub.  Pulmonary:     Effort: Pulmonary effort is normal. Tachypnea present.     Breath sounds: Examination of the left-middle field reveals rhonchi. Examination of the left-lower field reveals rhonchi. Rhonchi present. No wheezing or rales.  Abdominal:     General: Bowel sounds are normal. There is no distension.     Palpations: Abdomen is soft.     Tenderness: There is no abdominal tenderness. There is no guarding or rebound.  Musculoskeletal:        General: No tenderness. Normal range of motion.     Cervical back: Normal range of motion and neck supple.     Right lower leg: No edema.     Left lower leg: No edema.     Comments: No edema  Skin:    General: Skin is warm and dry.     Findings: No rash.     Comments: Hot to the touch  Neurological:     Mental Status: She is alert and oriented to person, place, and time. Mental status is at baseline.     Cranial Nerves: No cranial nerve deficit.  Psychiatric:        Mood and Affect: Mood normal.        Behavior: Behavior normal.    ED Results / Procedures / Treatments   Labs (all labs ordered are listed, but only abnormal results are displayed) Labs Reviewed  RESP PANEL BY RT-PCR (FLU A&B, COVID) ARPGX2 - Abnormal; Notable for the following components:      Result Value   SARS Coronavirus 2 by RT PCR POSITIVE (*)    All other components within normal limits  CBC WITH DIFFERENTIAL/PLATELET  COMPREHENSIVE METABOLIC PANEL  LACTIC ACID, PLASMA    EKG None  Radiology DG Chest 2 View  Result Date: 03/10/2021 CLINICAL DATA:  Cough, shortness of breath EXAM: CHEST - 2 VIEW COMPARISON:  Compared with report for previous study done on  10/05/2020. Due to technical difficulties, images of the previous study not available for comparison at the time of this dictation. FINDINGS: Transverse diameter of heart is within normal limits. Possible coronary artery stent is noted. Mediastinum is unremarkable. There are no signs of pulmonary edema or focal pulmonary consolidation. Small linear densities are seen in the lower lung fields, possibly suggesting scarring. Left hemidiaphragm is elevated. Left lateral CP angle is indistinct. Posterior costophrenic angles are clear. There is no pneumothorax. Pleural thickening is seen in the apices, more so on the left side. IMPRESSION: There are no signs of pulmonary edema or focal pulmonary consolidation. Small linear densities in the lower lung fields and blunting of left lateral CP angle may suggest scarring. Reading location: [place], [place]. Electronically Signed   By: Elmer Picker M.D.   On: 03/10/2021 11:07    Procedures Procedures   Medications Ordered in ED Medications - No data to display  ED Course  I have reviewed the triage vital signs and the nursing notes.  Pertinent labs & imaging results that were available during my care of the patient were reviewed by me and considered in my medical decision making (see chart for details).    MDM Rules/Calculators/A&P  Patient is a 75 year old female with multiple medical problems presenting today with infectious symptoms for the last 4 days that are gradually getting worse.  She has a productive cough, hot to the touch on exam and concern for fever.  Also complaining of shortness of breath.  EMS reported oxygen saturation of 89% on room air.  Here patient ranges between 89% to 93% on room air.  She is not having any wheezing but does have some rhonchi in the left lower lobe.  Concern for possible pneumonia versus flu versus COVID.  She has no abdominal pain, nausea vomiting or diarrhea.  She denies any urinary  symptoms and low suspicion for acute intra-abdominal pathology.  Chest x-ray, COVID and flu testing and labs are pending.  1:09 PM CXR without acute findings.  COVID positive.  Pt still waiting for a room   Final Clinical Impression(s) / ED Diagnoses Final diagnoses:  None    Rx / DC Orders ED Discharge Orders     None        Blanchie Dessert, MD 03/10/21 1620

## 2021-03-10 NOTE — ED Notes (Signed)
Critical Lab Value: Covid +  Dr. Maryan Rued made aware.

## 2021-03-10 NOTE — ED Provider Notes (Signed)
5:25 PM-patient seen earlier, evaluated by Dr. Maryan Rued.  She was evaluated for productive cough and shortness of breath with low oxygenation, at home.  Her sister was hospitalized over the weekend with COVID infection and the patient was exposed to her for the 2 days prior to her hospitalization.  She denies vomiting, dizziness, chest pain or weakness at this time.  Labs are still pending.  Oxygen saturation has been normal here, without supplementation.  Patient states that she has had a single COVID-vaccine, "and that was enough."  5:40 PM-room air oxygen saturation 89%.  He was placed on oxygen, 2 L by nasal cannula with improvement of the oxygen saturation to 96%.  6:35 PM-she states that she is willing to try Paxlovid.  Patient's daughter states that earlier today, at home, family member checked her oxygen level and it was in the low 80s.  They then placed her on oxygen that she had leftover from a prior illness, and her sats improved to greater than 95%.  Daughter cannot recall which home health services patient was using.     SATURATION QUALIFICATIONS: (This note is used to comply with regulatory documentation for home oxygen)  Patient Saturations on Room Air at Rest = 89%  Patient Saturations on Room Air while Ambulating = 87%  Patient Saturations on 2 Liters of oxygen while Ambulating = 96%  Please briefly explain why patient needs home oxygen: Symptomatic hypoxia, Acute Covid infection   9:45 PM-patient is alert and comfortable.  Paxlovid prescription is at the bedside.  Patient's daughter showed me a picture of an oxygen concentrator which she has at home, apparently to use for hypoxia.  I have asked social work to arrange for ongoing care.  Patient's daughter stated that she was worried that the patient had thrush.  Patient's mouth is moderately red, but there are no plaques or exudates.  We will give empiric treatment for possible thrush.  Patient discharged with full Paxlovid  prescription after taking first dose in the ED.  Recommend PCP follow-up.  Use oxygen for comfort.   Daleen Bo, MD 03/10/21 2223

## 2021-03-10 NOTE — Care Management (Signed)
Consult to [facility] regarding home oxygen, sent RN message that she needs qualifications first, due to the time, the oxygen will not be able to be delivered until AM. MD and RN aware.

## 2021-03-10 NOTE — Discharge Instructions (Addendum)
Always wear a mask when around anyone else, until all of your symptoms are gone or at least 5 days.  Take the Paxlovid as directed by the directions that you have.  For fever or pain use Tylenol.  For cough use Robitussin-DM.  We sent a prescription to treat for possible thrush, to your pharmacy.  Use your oxygen, for oxygen saturations less than 90%.

## 2021-03-10 NOTE — ED Notes (Addendum)
Pt's RA sats after being off of O2 for approximately 10 min were 89%.  Pt ambulated with no O2 sats ranged from 88 to 91%.  Pt returned to bed and sats dropped to 87% on RA. Pt placed back on 2L [place] and sats returned to 93%.

## 2021-03-10 NOTE — ED Notes (Signed)
Pt placed on sat monitor on room air which read 89%.  Pt placed on 2L [place] and sats improved to mid to upper 90s.

## 2021-03-11 ENCOUNTER — Telehealth: Payer: Self-pay

## 2021-03-11 NOTE — Telephone Encounter (Signed)
Called daughter to check on patien and to see whaat provider her oxygen is from. She gave me her brothers information , where the patent is staying, [phone]. Caqlled  son, Ace Gins is the provider will call and update them on qualifications and orders.

## 2021-04-11 ENCOUNTER — Telehealth: Payer: Self-pay | Admitting: *Deleted

## 2021-04-11 NOTE — Telephone Encounter (Signed)
   Pre-operative Risk Assessment    Patient Name: Kathryn Garrett  DOB: [birth year] MRN: [id number]     Request for Surgical Clearance   Procedure:  Dental Extraction - No. of Teeth:  3 TEETH WILL BE EXTRACTED ALONG WITH ALVEOLOPLASTY   Date of Surgery: Clearance TBD                                 Surgeon:  DR. Birdie Sons Surgeon's Group or Practice Name:  [facility]  Phone number:  [phone] Fax number:  [phone]   Type of Clearance Requested: - Medical  - Pharmacy:  Hold Aspirin and Clopidogrel (Plavix) ; DR. Birdie Sons IS ASKING IF INR IS NEEDED AND IF SO WILL NEED CURRENT INR RESULTS FAXED AS WELL   Type of Anesthesia:   Local    Additional requests/questions:   Kathryn Garrett   04/11/2021, 10:30 AM

## 2021-04-11 NOTE — Telephone Encounter (Signed)
OK to hold plavix 5 days prior and aspirin 7 days prior to tooth extraction.  Restart hopefully the day after extraction.

## 2021-04-11 NOTE — Telephone Encounter (Signed)
Left VM

## 2021-04-11 NOTE — Telephone Encounter (Signed)
Dr. Irish Lack Pt has a history of CAD with BMS in RCA (2010) with development of ISR requiring DES (2012) and subsequent balloon angioplasty (2017). Heart cath 09/2020 with patent stents with mild to moderate ISR.   She remains on DAPT due to ISR. We are asked to hold both ASA and plavix for extraction of three teeth and alveoloplasty.   OK to hold both? Or continue ASA uninterrupted?

## 2021-06-07 ENCOUNTER — Encounter (HOSPITAL_BASED_OUTPATIENT_CLINIC_OR_DEPARTMENT_OTHER): Payer: Self-pay | Admitting: Family

## 2021-06-07 ENCOUNTER — Other Ambulatory Visit: Payer: Self-pay

## 2021-06-07 ENCOUNTER — Ambulatory Visit (INDEPENDENT_AMBULATORY_CARE_PROVIDER_SITE_OTHER): Payer: Medicare HMO | Admitting: Family

## 2021-06-07 VITALS — BP 130/70 | HR 82 | Ht 59.0 in | Wt 142.0 lb

## 2021-06-07 DIAGNOSIS — I10 Essential (primary) hypertension: Secondary | ICD-10-CM

## 2021-06-07 DIAGNOSIS — I255 Ischemic cardiomyopathy: Secondary | ICD-10-CM | POA: Diagnosis not present

## 2021-06-07 DIAGNOSIS — I25118 Atherosclerotic heart disease of native coronary artery with other forms of angina pectoris: Secondary | ICD-10-CM | POA: Diagnosis not present

## 2021-06-07 DIAGNOSIS — I471 Supraventricular tachycardia: Secondary | ICD-10-CM

## 2021-06-07 DIAGNOSIS — E785 Hyperlipidemia, unspecified: Secondary | ICD-10-CM

## 2021-06-07 MED ORDER — ROSUVASTATIN CALCIUM 20 MG PO TABS: 20.0000 mg | ORAL_TABLET | Freq: Every day | ORAL | 3 refills | Status: DC

## 2021-06-07 NOTE — Telephone Encounter (Signed)
° °  Patient Name: Kathryn Garrett  DOB: 1945/07/16 MRN: [id number]  Primary Cardiologist: Larae Grooms, MD  Chart reviewed as part of pre-operative protocol coverage.   She is acceptable risk for the planned procedure and may proceed without additional cardiovascular testing. She was seen in clinic 06/07/21 doing well from a cardiac perspective. The patient was advised that if she develops new symptoms prior to surgery to contact our office to arrange for a follow-up visit, and she verbalized understanding.  SBE prophylaxis is not required for the patient from a cardiac standpoint.  Per Dr. Irish Lack OK to hold plavix 5 days prior and aspirin 7 days prior to tooth extraction.  Restart hopefully the day after extraction.   I will route this recommendation to the requesting party via Epic fax function and remove from pre-op pool.  Please call with questions.  Loel Dubonnet, NP 06/07/2021, 10:21 PM

## 2021-06-07 NOTE — Progress Notes (Signed)
Office Visit    Patient Name: Kathryn Garrett Date of Encounter: 06/07/2021  PCP:  Imagene Riches, NP   [place]  Cardiologist:  Larae Grooms, MD  Advanced Practice Provider:  No care team member to display Electrophysiologist:  None  Chief Complaint    [name] is a 76 y.o. female with a hx of CAD s/p DES to RCA, hypertension, hyperlipidemia, COPD, HFrEF/ICM.  Presents today for follow up of CAD.  Past Medical History    Past Medical History:  Diagnosis Date   Anemia, mild    Arthritis    "qwhere" (02/10/2016)   CAD (coronary artery disease)    a. VF arrest 01/2009/CAD with inferoposterior MI s/p aspiration thrombectomy/BMS of RCA at that time. b. ISR of BMS s/p DES to RCA 06/2010 with moderate LM/LAD disease not significant by FFR. c. s/p angiosculpt PTCA to prox RCA 01/2016 for ISR. d. Aggressive PTCA to prox RCA for ISR 05/2016.   Cardiac arrest ([place]) 01/2009   a. in setting of inf-post STEMI 01/2009 (VF).   Chest pain 06/04/2016   Chronic bronchitis (HCC)    Chronic combined systolic and diastolic CHF (congestive heart failure) ([place])    a. EF 40-45% by cath 2010. b. 60-65% with grade 1 DD by echo 09/2015   COPD (chronic obstructive pulmonary disease) ([place])    Hyperlipidemia 01/28/2016   Hyperlipidemia LDL goal <70 01/28/2016   Hypertension    Lung mass    MI (myocardial infarction) ([place]) 01/2009   Narcotic abuse ([place])    pt now taking Suboxone tid   Pleural effusion on left 09/24/2015   Pneumonia 06/2014; 09/2015   Pre-diabetes    Pulmonary nodule    PVD (peripheral vascular disease) (HCC)    mild atherosclerosis of infrarenal aorta, 25% ostial left renal artery stenosis, 50% ostial right common iliac by cath 2010   S/P PTCA (percutaneous transluminal coronary angioplasty) 02/10/16 to RCA lesion for in stent restenois 02/11/2016   Subclavian artery stenosis (HCC)    a. >50% by duplex 05/2016.   Tobacco abuse    Unstable angina [facility])     Past Surgical History:  Procedure Laterality Date   ABDOMINAL HYSTERECTOMY     ANKLE FRACTURE SURGERY Right 2000s   CARDIAC CATHETERIZATION N/A 02/10/2016   Procedure: Left Heart Cath and Coronary Angiography;  Surgeon: Jettie Booze, MD;  Location: [place] [facility];  Service: Cardiovascular;  Laterality: N/A;   CARDIAC CATHETERIZATION N/A 02/10/2016   Procedure: Coronary Balloon Angioplasty;  Surgeon: Jettie Booze, MD;  Location: [place] [facility];  Service: Cardiovascular;  Laterality: N/A;  instent RCA   CARDIAC CATHETERIZATION N/A 06/05/2016   Procedure: Left Heart Cath and Coronary Angiography;  Surgeon: Leonie Man, MD;  Location: [place] [facility];  Service: Cardiovascular;  Laterality: N/A;   CARDIAC CATHETERIZATION N/A 06/05/2016   Procedure: Coronary Balloon Angioplasty;  Surgeon: Leonie Man, MD;  Location: [place] [facility];  Service: Cardiovascular;  Laterality: N/A;   CHEST TUBE INSERTION N/A 10/08/2015   Procedure: PLEURX CATH REMOVAL;  Surgeon: [name], MD;  Location: [facility];  Service: Thoracic;  Laterality: N/A;   CORONARY ANGIOPLASTY WITH STENT PLACEMENT  01/2009; 2012   FRACTURE SURGERY     RIGHT/LEFT HEART CATH AND CORONARY ANGIOGRAPHY N/A 09/24/2020   Procedure: RIGHT/LEFT HEART CATH AND CORONARY ANGIOGRAPHY;  Surgeon: Nelva Bush, MD;  Location: [place] [facility] [facility];  Service: Cardiovascular;  Laterality: N/A;  VIDEO ASSISTED THORACOSCOPY (VATS)/THOROCOTOMY Left 09/29/2015   Procedure: PREOP BRONCHOSCOPY, LEFT THORACOSCOPY, POSSIBLE THORACOTOMY, PLEURAL BIOPSY, TALC;  Surgeon: [name], MD;  Location: [facility] OR[facility];  Service: General;  Laterality: Left;    Allergies  Allergies  Allergen Reactions   Azithromycin Rash    History of Present Illness    Kathryn Garrett is a 76 y.o. female with a hx of CAD s/p DES to RCA, hypertension, hyperlipidemia, COPD, HFrEF/ICM last seen 03/02/21.  Her CAD dates back to inferior MI treated  with bare metal stent to RCA in 2010. In 06/2010 she had DES to proximal to mid RCA for ISR of the BMS. Her left main and LAD disease was moderate and not significant. She was admitted 01/2016 for unstable angina with cardiac cath showing ISR of RCA for which she underwent balloon angioplasty with ISR 75% ? 0% and Plavix was resumed. She had unstable angina again 05/2016 with cardiac cath showing ISR within proximal RCA treated with PTCA with scoring balloon. She had bruit on exam with subsequent carotid doppler showing bilateral ECA stenosis >50% but no significant ICA stenosis.   She had COVID 01/2020 requiring oxygen therapy. She was started on Levothyroxine 05/2020 due to hypothyroidism. She was hospitalized 09/10/20 with acute hypoxic respiratory failure secondary to COPD exacerbation. Complicated by flash pulmonary edema and NSTEMI. Echo 09/11/20 LVEF 25-30%, indeterminite diastolic parameters, mild MR. [facility] 09/24/20 showing mild to moderate nonobstructive CAD including  20-35% distal LMCA and 50% ostial LAD disease with FFR not hemodynamicaly significant, prox RCA stent with mild-moderat ICR (30-40%), normal pulmonary pressure, cardiac output/index, filling pressures. Hospital course further complicated by gram negative bacteremia, evaluated by ID and treated with meropenem. She did have SVT while admitted and was discharged on Toprol. Repeat echo 11/21/2020 LVEF 60 to 65%, no R WMA, mild LVH, grade 1 diastolic dysfunction, RV normal size and function, normal PASP, trivial MR.    She presents today for follow up. Since last seen had COVID 03/10/21 treated with Paxlovid not requiring admission. She is hesitant regarding COVID vaccination. Still staying with her daughter, Estill Bamberg, in [place] but making frequent trips to visit family in [place]. She notes weight gain of about 7 pounds which she attributes to not walking as much. Hopeful to join [employer] in [place] area. Reports no shortness of breath nor dyspnea on  exertion. Reports no chest pain, pressure, or tightness. No edema, orthopnea, PND. Reports no palpitations.  Not smoking cigarettes but has been vaping some, encouraged to stop. Shares with me that she stopped her Crestor due to myalgias. Was on 40mg  but previously tolerated 20mg  without difficulty.    EKGs/Labs/Other Studies Reviewed:   The following studies were reviewed today:  Echo 11/24/20  1. Left ventricular ejection fraction, by estimation, is 60 to 65%. The  left ventricle has normal function. The left ventricle has no regional  wall motion abnormalities. There is mild concentric left ventricular  hypertrophy. Left ventricular diastolic  parameters are consistent with Grade I diastolic dysfunction (impaired  relaxation).   2. Right ventricular systolic function is normal. The right ventricular  size is normal. There is normal pulmonary artery systolic pressure. The  estimated right ventricular systolic pressure is 79.1 mmHg.   3. The mitral valve is normal in structure. Trivial mitral valve  regurgitation. No evidence of mitral stenosis.   4. The aortic valve is tricuspid. Aortic valve regurgitation is not  visualized. No aortic stenosis is present.   5. The inferior vena cava is  normal in size with greater than 50%  respiratory variability, suggesting right atrial pressure of 3 mmHg.   Comparison(s): Compared to prior TTE in 08/2020, the LVEF has returned to  normal (60-65% from 25-30%).  L/RHC (09/24/2020): Mild to moderate, non-obstructive coronary artery disease including 20-35% distal LMCA and 50% ostial LAD disease, similar to prior catheterization when FFR was not hemodynamically significant. Patent proximal RCA stents with mild to moderate in-stent restenosis (30-40%). Normal left and right heart filling pressures. Normal pulmonary artery pressures. Normal cardiac output/index.   TTE (09/11/2020):  1. Recommend limited echo with iv contrast agent (definity) for  addequate  assessment of LVEF and wall motion.. Left ventricular ejection fraction,  by estimation, is 25 to 30%. The left ventricle has severely decreased  function. Left ventricular  endocardial border not optimally defined to evaluate regional wall motion.  Left ventricular diastolic parameters are indeterminate.   2. Right ventricular systolic function is normal. The right ventricular  size is normal.   3. The mitral valve was not well visualized. Mild mitral valve  regurgitation.   4. The aortic valve was not well visualized. Aortic valve regurgitation  is not visualized.  EKG:  No EKG today.   Recent Labs: 09/12/2020: B Natriuretic Peptide 970.2 10/09/2020: Magnesium 2.3 03/10/2021: ALT 16; BUN 21; Creatinine, Ser 0.95; Hemoglobin 13.1; Platelets 251; Potassium 3.7; Sodium 135  Recent Lipid Panel    Component Value Date/Time   CHOL 107 10/02/2019 1228   CHOL 85 03/20/2013 0557   TRIG 183 (H) 09/15/2020 0552   TRIG 112 03/20/2013 0557   HDL 41 10/02/2019 1228   HDL 21 (L) 03/20/2013 0557   CHOLHDL 2.6 10/02/2019 1228   CHOLHDL 3.5 06/05/2016 0911   VLDL 24 06/05/2016 0911   VLDL 22 03/20/2013 0557   LDLCALC 43 10/02/2019 1228   LDLCALC 42 03/20/2013 0557   Home Medications   Current Meds  Medication Sig   acetaminophen (TYLENOL) 500 MG tablet Take 1,000 mg by mouth every 6 (six) hours as needed for headache.   albuterol (PROVENTIL HFA;VENTOLIN HFA) 108 (90 Base) MCG/ACT inhaler Inhale 2 puffs into the lungs every 4 (four) hours as needed for wheezing or shortness of breath.   aspirin EC 81 MG tablet Take 81 mg by mouth at bedtime.    azelastine (ASTELIN) 0.1 % nasal spray Place 2 sprays into both nostrils daily.   Buprenorphine HCl-Naloxone HCl 8-2 MG FILM Place 0.5 Film under the tongue daily.   cetirizine (ZYRTEC) 10 MG tablet Take 1 tablet (10 mg total) by mouth daily.   clopidogrel (PLAVIX) 75 MG tablet Take 1 tablet (75 mg total) by mouth daily.   empagliflozin  (JARDIANCE) 10 MG TABS tablet Take 1 tablet (10 mg total) by mouth daily.   fluticasone (FLONASE) 50 MCG/ACT nasal spray Place 1 spray into both nostrils daily.   food thickener (THICK IT) POWD Use as needed for honey thick liquids   furosemide (LASIX) 20 MG tablet Take 1 tablet (20 mg total) by mouth as needed for fluid or edema. (Patient taking differently: Take 20 mg by mouth daily as needed for fluid or edema.)   levothyroxine (SYNTHROID) 88 MCG tablet Take 88 mcg by mouth daily.   losartan (COZAAR) 50 MG tablet Take 1 tablet (50 mg total) by mouth daily.   metoprolol succinate (TOPROL-XL) 50 MG 24 hr tablet Take 1 tablet (50 mg total) by mouth daily. Take with or immediately following a meal.   nystatin (MYCOSTATIN) 100000 UNIT/ML  suspension Take 5 mLs (500,000 Units total) by mouth 4 (four) times daily. Swish and spit   pantoprazole (PROTONIX) 40 MG tablet Take 1 tablet (40 mg total) by mouth daily. Further refills per PCP. (Patient taking differently: Take 40 mg by mouth daily.)   Potassium Chloride ER 20 MEQ TBCR Take 1 tablet by mouth daily.   rosuvastatin (CRESTOR) 40 MG tablet Take 1 tablet (40 mg total) by mouth at bedtime.   TRELEGY ELLIPTA 100-62.5-25 MCG/INH AEPB Take 1 puff by mouth daily.   triamcinolone cream (KENALOG) 0.1 % Apply 1 application topically 2 (two) times daily. to affected area    Review of Systems    All other systems reviewed and are otherwise negative except as noted above.  Physical Exam    VS:  There were no vitals taken for this visit. , BMI There is no height or weight on file to calculate BMI.  Wt Readings from Last 3 Encounters:  03/10/21 135 lb (61.2 kg)  03/02/21 135 lb 6.4 oz (61.4 kg)  11/26/20 129 lb (58.5 kg)    GEN: Well nourished, well developed, in no acute distress. HEENT: normal. Neck: Supple, no JVD, carotid bruits, or masses. Cardiac: RRR, no murmurs, rubs, or gallops. No clubbing, cyanosis, edema.  Radials/DP/PT 2+ and equal  bilaterally.  Respiratory:  Respirations regular and unlabored, clear to auscultation bilaterally. GI: Soft, nontender, nondistended. MS: No deformity or atrophy. Skin: Warm and dry, no rash. Neuro:  Strength and sensation are intact. Psych: Normal affect.  Assessment & Plan    SVT / Sinus tachycardia -No palpitations. Continue Toprol 50mg  daily.   HTN -BP well controlled. Continue current antihypertensive regimen including Losartan 50mg  QD, Toprol 50QD.  She will report if BP consistently greater than 130/80.    CAD s/p DES to RCA - RCA initially with BMS with ISR requiring DES and subsequent balloon angioplasty. LHC 09/2020 mild to moderate nonobstructive disease of distal LMCA and LAD with patent prox RCA stent mild to moderate ISR (30-40%). Stable with no anginal symptoms. No indication for ischemic evaluation. GDMT includes aspirin, crestor, toprol, jardiance, plavix. In setting of recurrent ISR would recommend continuation of DAPT. Heart healthy diet and regular cardiovascular exercise encouraged.  Encouraged to join [employer] or alternate exercise program in [place] where she is staying with her daughter.   Chronic systolic and diastolic heart failure with recovered LVEF./ Ischemic cardiomyopathy - LVEF by echo 09/11/2020 of 25-30% in the setting of NSTEMI, heart failure exacerbation, bacteremia, complicated hospitalization.  Echocardiogram 11/2020 LVEF improved to 19-62%, grade 1 diastolic dysfunction, mild LVH, no significant valvular abnormality. GDMT includes Jardiance 10 mg daily, Toprol 50 mg daily, losartan 50 mg daily, Lasix 20 mg as needed.  Continue current medications.  HLD, LDL goal <70 -03/07/21 LDL 40, total cholesterol 102, triglycerides 131, HDL 38.  Has not taken Crestor in a couple months due to myalgias. Will resume at reduced dose Crestor 20mg  QD. If she notes myalgias, she will contact our office and consider trial of Pravastatin vs referral to lipid clinic. Repeat lipid panel  at next follow up.   Tobacco use -has not smoked since hospitalization.  Continue cessation encouraged.  Encouraged to utilize 1-800 quit now. She has been vaping some and we discussed avoidance of all nicotine products. Coping with quitting handout provided.  GERD - Continue Protonix.   COPD - No signs of acute exacerbations. Follow with PCP. Continue Trelegy.   Disposition: Follow up in 6 months with Dr. Christ Kick  or APP.    Signed, Loel Dubonnet, NP 06/07/2021, 11:27 AM [place]

## 2021-06-07 NOTE — Patient Instructions (Addendum)
Medication Instructions:  Your physician has recommended you make the following change in your medication:   START Rosuvastatin (Crestor) 20mg  daily   You can start by taking it 3 times per week for 2 weeks then increase to daily to get used to it gradually.  If you notice muscle aches or pains after starting, please let us know. We can consider adjusting the medicaiton. You have previously tolerated this dose well so I anticipate you will tolerate it well again!  *If you need a refill on your cardiac medications before your next appointment, please call your pharmacy*  Lab Work: No labs ordered today.   Testing/Procedures: None ordered today.   Follow-Up: At [facility], you and your health needs are our priority.  As part of our continuing mission to provide you with exceptional heart care, we have created designated Provider Care Teams.  These Care Teams include your primary Cardiologist (physician) and Advanced Practice Providers (APPs -  Physician Assistants and Nurse Practitioners) who all work together to provide you with the care you need, when you need it.  We recommend signing up for the patient portal called "MyChart".  Sign up information is provided on this After Visit Summary.  MyChart is used to connect with patients for Virtual Visits (Telemedicine).  Patients are able to view lab/test results, encounter notes, upcoming appointments, etc.  Non-urgent messages can be sent to your provider as well.   To learn more about what you can do with MyChart, go to [email or web address].    Your next appointment:   6 month(s)  The format for your next appointment:   In Person  Provider:   Larae Grooms, MD or Loel Dubonnet, NP     Other Instructions  Heart Healthy Diet Recommendations: A low-salt diet is recommended. Meats should be grilled, baked, or boiled. Avoid fried foods. Focus on lean protein sources like fish or chicken with vegetables and fruits. The  American Heart Association is a Microbiologist!  American Heart Association Diet and Lifeystyle Recommendations    Exercise recommendations: The American Heart Association recommends 150 minutes of moderate intensity exercise weekly. Try 30 minutes of moderate intensity exercise 4-5 times per week. This could include walking, jogging, or swimming.   Recommend gradually increasing your exercise.  Try a membership at the [facility] or a local exercise program or the senior center.   Managing the Challenge of Quitting Smoking Recommend avoiding vaping.  Quitting smoking is a physical and mental challenge. You will face cravings, withdrawal symptoms, and temptation. Before quitting, work with your health care provider to make a plan that can help you manage quitting. Preparation can help you quit and keep you from giving in. How to manage lifestyle changes Managing stress Stress can make you want to smoke, and wanting to smoke may cause stress. It is important to find ways to manage your stress. You might try some of the following: Practice relaxation techniques. Breathe slowly and deeply, in through your nose and out through your mouth. Listen to music. Soak in a bath or take a shower. Imagine a peaceful place or vacation. Get some support. Talk with family or friends about your stress. Join a support group. Talk with a counselor or therapist. Get some physical activity. Go for a walk, run, or bike ride. Play a favorite sport. Practice yoga.  General instructions Be aware that many people gain weight after they quit smoking. However, not everyone does. To keep from gaining weight, have a plan in  place before you quit and stick to the plan after you quit. Your plan should include: Having healthy snacks. When you have a craving, it may help to: Eat popcorn, carrots, celery, or other cut vegetables. Chew sugar-free gum. Changing how you eat. Eat small portion sizes at meals. Eat 4-6 small  meals throughout the day instead of 1-2 large meals a day. Be mindful when you eat. Do not watch television or do other things that might distract you as you eat. Exercising regularly. Make time to exercise each day. If you do not have time for a long workout, do short bouts of exercise for 5-10 minutes several times a day. Do some form of strengthening exercise, such as weight lifting. Do some exercise that gets your heart beating and causes you to breathe deeply, such as walking fast, running, swimming, or biking. This is very important. Drinking plenty of water or other low-calorie or no-calorie drinks. Drink 6-8 glasses of water daily.  How to manage cravings Come up with a plan for how to deal with your cravings. The plan should include the following: A definition of the specific situation you want to deal with. An alternative action you will take. A clear idea for how this action will help. The name of someone who might help you with this. Cravings usually last for 5-10 minutes. Consider taking the following actions to help you with your plan to deal with cravings: Keep your mouth busy. Chew sugar-free gum. Suck on hard candies or a straw. Brush your teeth. Keep your hands and body busy. Change to a different activity right away. Squeeze or play with a ball. Do an activity or a hobby, such as making bead jewelry, practicing needlepoint, or working with wood. Mix up your normal routine. Take a short exercise break. Go for a quick walk or run up and down stairs. Focus on doing something kind or helpful for someone else. Call a friend or family member to talk during a craving. Join a support group. Contact a quitline. Where to find support To get help or find a support group: Call the [facility]'s Smoking Quitline: 1-800-QUIT NOW (346)803-3631) Visit the website of the Substance Abuse and Mental Health Services Administration: ktimeonline.com Text QUIT to SmokefreeTXT:  354562

## 2021-06-09 ENCOUNTER — Ambulatory Visit (HOSPITAL_BASED_OUTPATIENT_CLINIC_OR_DEPARTMENT_OTHER): Payer: Medicare HMO | Admitting: Family

## 2021-09-24 ENCOUNTER — Other Ambulatory Visit (HOSPITAL_BASED_OUTPATIENT_CLINIC_OR_DEPARTMENT_OTHER): Payer: Self-pay | Admitting: Family

## 2021-09-26 NOTE — Telephone Encounter (Signed)
Rx(s) sent to pharmacy electronically.  

## 2021-10-27 ENCOUNTER — Other Ambulatory Visit (HOSPITAL_BASED_OUTPATIENT_CLINIC_OR_DEPARTMENT_OTHER): Payer: Self-pay | Admitting: Family

## 2021-10-27 DIAGNOSIS — I5022 Chronic systolic (congestive) heart failure: Secondary | ICD-10-CM

## 2021-10-27 MED ORDER — EMPAGLIFLOZIN 10 MG PO TABS: 10.0000 mg | ORAL_TABLET | Freq: Every day | ORAL | 1 refills | Status: DC

## 2021-10-27 NOTE — Telephone Encounter (Signed)
Pt of Dr. Varanasi. Please review for refill. Thank you! 

## 2022-01-23 NOTE — Progress Notes (Deleted)
Office Visit    Patient Name: Kathryn Garrett Date of Encounter: 01/23/2022  Primary Care Provider:  Rhea Bleacher, NP Primary Cardiologist:  Kathryn Grooms, MD Primary Electrophysiologist: None  Chief Complaint    Kathryn Garrett is a 76 y.o. female with PMH of HFrEF, COPD, CAD s/p DES to RCA, HTN, PVD (ECA stenosis but no ICA stenosis), HLD, ICM who presents today for posthospital follow-up of acute on chronic congestive heart failure.  Past Medical History    Past Medical History:  Diagnosis Date   Anemia, mild    Arthritis    "qwhere" (02/10/2016)   CAD (coronary artery disease)    a. VF arrest 01/2009/CAD with inferoposterior MI s/p aspiration thrombectomy/BMS of RCA at that time. b. ISR of BMS s/p DES to RCA 06/2010 with moderate LM/LAD disease not significant by FFR. c. s/p angiosculpt PTCA to prox RCA 01/2016 for ISR. d. Aggressive PTCA to prox RCA for ISR 05/2016.   Cardiac arrest ([place]) 01/2009   a. in setting of inf-post STEMI 01/2009 (VF).   Chest pain 06/04/2016   Chronic bronchitis (HCC)    Chronic combined systolic and diastolic CHF (congestive heart failure) ([place])    a. EF 40-45% by cath 2010. b. 60-65% with grade 1 DD by echo 09/2015   COPD (chronic obstructive pulmonary disease) ([place])    Hyperlipidemia 01/28/2016   Hyperlipidemia LDL goal <70 01/28/2016   Hypertension    Lung mass    MI (myocardial infarction) ([place]) 01/2009   Narcotic abuse ([place])    pt now taking Suboxone tid   Pleural effusion on left 09/24/2015   Pneumonia 06/2014; 09/2015   Pre-diabetes    Pulmonary nodule    PVD (peripheral vascular disease) (HCC)    mild atherosclerosis of infrarenal aorta, 25% ostial left renal artery stenosis, 50% ostial right common iliac by cath 2010   S/P PTCA (percutaneous transluminal coronary angioplasty) 02/10/16 to RCA lesion for in stent restenois 02/11/2016   Subclavian artery stenosis (HCC)    a. >50% by duplex 05/2016.   Tobacco abuse    Unstable angina  [facility])    Past Surgical History:  Procedure Laterality Date   ABDOMINAL HYSTERECTOMY     ANKLE FRACTURE SURGERY Right 2000s   CARDIAC CATHETERIZATION N/A 02/10/2016   Procedure: Left Heart Cath and Coronary Angiography;  Surgeon: Kathryn Booze, MD;  Location: [facility];  Service: Cardiovascular;  Laterality: N/A;   CARDIAC CATHETERIZATION N/A 02/10/2016   Procedure: Coronary Balloon Angioplasty;  Surgeon: Kathryn Booze, MD;  Location: [place] [facility];  Service: Cardiovascular;  Laterality: N/A;  instent RCA   CARDIAC CATHETERIZATION N/A 06/05/2016   Procedure: Left Heart Cath and Coronary Angiography;  Surgeon: Kathryn Man, MD;  Location: [facility] [facility];  Service: Cardiovascular;  Laterality: N/A;   CARDIAC CATHETERIZATION N/A 06/05/2016   Procedure: Coronary Balloon Angioplasty;  Surgeon: Kathryn Man, MD;  Location: [facility];  Service: Cardiovascular;  Laterality: N/A;   CHEST TUBE INSERTION N/A 10/08/2015   Procedure: PLEURX CATH REMOVAL;  Surgeon: Kathryn Lewandowsky, MD;  Location: [facility];  Service: Thoracic;  Laterality: N/A;   CORONARY ANGIOPLASTY WITH STENT PLACEMENT  01/2009; 2012   FRACTURE SURGERY     RIGHT/LEFT HEART CATH AND CORONARY ANGIOGRAPHY N/A 09/24/2020   Procedure: RIGHT/LEFT HEART CATH AND CORONARY ANGIOGRAPHY;  Surgeon: Kathryn Bush, MD;  Location: [facility];  Service: Cardiovascular;  Laterality: N/A;   VIDEO  ASSISTED THORACOSCOPY (VATS)/THOROCOTOMY Left 09/29/2015   Procedure: PREOP BRONCHOSCOPY, LEFT THORACOSCOPY, POSSIBLE THORACOTOMY, PLEURAL BIOPSY, TALC;  Surgeon: Kathryn Lewandowsky, MD;  Location: [facility];  Service: General;  Laterality: Left;    Allergies  Allergies  Allergen Reactions   Azithromycin Rash    History of Present Illness    Kathryn Garrett has a PMH of is a 76 year old female with the above mention past medical history who presents today for follow-up of acute on chronic CHF.  Kathryn Garrett has an  extensive coronary history with previous VF arrest following inferior lateral posterior STEMI in 01/2009 that was treated with BMS to RCA.  She underwent left heart cath in 2012 with DES to proximal mid RCA for in-stent restenosis.  In 05/2016 LHC was performed and showed in-stent restenosis within the proximal RCA that was treated with PTCA with scoring balloon.  She was hospitalized 08/2020 with acute hypoxic respiratory failure secondary to COPD.  She developed flash pulmonary edema and had NSTEMI with R/LHC performed showing mild to moderate nonobstructive CAD, normal pulmonary and filling pressures with normal cardiac index.  She developed bacteremia and was consulted by infectious disease and treated with meropenem.  She had 2D echo completed 7/10/202260 to 65%, no R WMA, mild LVH, grade 1 diastolic dysfunction, RV normal size and function, normal PASP, trivial MR  She was last seen by Kathryn Montana, NP on 05/2021 for follow-up.  She was doing well and had no reports of shortness of breath or dyspnea.  She noted a 7 pound weight gain but attributed to not walking as much.  She also reported stopping her Crestor due to myalgias.  Crestor was resumed and patient instructed to contact office if myalgias returned.    She was admitted at [facility] in [place] on 01/17/2022 due to acute on chronic CHF exacerbation.  She arrived via EMS with complaints of not being able to breathe.  She reported noncompliance with Lasix because she did not have any swelling.  Chest x-ray was completed that showed mild pulmonary vascular congestion.  She was started on IV Lasix and diuresed and was transitioned to p.o. Lasix 40 mg daily.  She was discharged on 9/8 in stable condition.  Since last being seen in the office patient reports***.  Patient denies chest pain, palpitations, dyspnea, PND, orthopnea, nausea, vomiting, dizziness, syncope, edema, weight gain, or early satiety.     ***Notes:  Home  Medications    Current Outpatient Medications  Medication Sig Dispense Refill   acetaminophen (TYLENOL) 500 MG tablet Take 1,000 mg by mouth every 6 (six) hours as needed for headache.     albuterol (PROVENTIL HFA;VENTOLIN HFA) 108 (90 Base) MCG/ACT inhaler Inhale 2 puffs into the lungs every 4 (four) hours as needed for wheezing or shortness of breath.     aspirin EC 81 MG tablet Take 81 mg by mouth at bedtime.      azelastine (ASTELIN) 0.1 % nasal spray Place 2 sprays into both nostrils daily.  0   Buprenorphine HCl-Naloxone HCl 8-2 MG FILM Place 0.5 Film under the tongue daily.     cetirizine (ZYRTEC) 10 MG tablet Take 1 tablet (10 mg total) by mouth daily. 30 tablet 0   clopidogrel (PLAVIX) 75 MG tablet TAKE 1 TABLET(75 MG) BY MOUTH DAILY 90 tablet 2   empagliflozin (JARDIANCE) 10 MG TABS tablet Take 1 tablet (10 mg total) by mouth daily. 90 tablet 1   fluticasone (FLONASE) 50 MCG/ACT nasal  spray Place 1 spray into both nostrils daily.     food thickener (THICK IT) POWD Use as needed for honey thick liquids 1020 g 0   furosemide (LASIX) 20 MG tablet Take 1 tablet (20 mg total) by mouth as needed for fluid or edema. (Patient taking differently: Take 20 mg by mouth daily as needed for fluid or edema.) 30 tablet 5   levothyroxine (SYNTHROID) 88 MCG tablet Take 88 mcg by mouth daily.     losartan (COZAAR) 50 MG tablet Take 1 tablet (50 mg total) by mouth daily. 90 tablet 2   metoprolol succinate (TOPROL-XL) 50 MG 24 hr tablet Take 1 tablet (50 mg total) by mouth daily. Take with or immediately following a meal. 90 tablet 2   nitroGLYCERIN (NITROSTAT) 0.4 MG SL tablet Place 1 tablet (0.4 mg total) under the tongue every 5 (five) minutes as needed for chest pain. 25 tablet 3   pantoprazole (PROTONIX) 40 MG tablet Take 1 tablet (40 mg total) by mouth daily. Further refills per PCP. (Patient taking differently: Take 40 mg by mouth daily.) 30 tablet 2   Potassium Chloride ER 20 MEQ TBCR Take 1 tablet  by mouth daily.     rosuvastatin (CRESTOR) 20 MG tablet Take 1 tablet (20 mg total) by mouth daily. 90 tablet 3   TRELEGY ELLIPTA 100-62.5-25 MCG/INH AEPB Take 1 puff by mouth daily.     triamcinolone cream (KENALOG) 0.1 % Apply 1 application topically 2 (two) times daily. to affected area     No current facility-administered medications for this visit.     Review of Systems  Please see the history of present illness.    (+)*** (+)***  All other systems reviewed and are otherwise negative except as noted above.  Physical Exam    Wt Readings from Last 3 Encounters:  06/07/21 142 lb (64.4 kg)  03/10/21 135 lb (61.2 kg)  03/02/21 135 lb 6.4 oz (61.4 kg)   EV:OJJKK were no vitals filed for this visit.,There is no height or weight on file to calculate BMI.  Constitutional:      Appearance: Healthy appearance. Not in distress.  Neck:     Vascular: JVD normal.  Pulmonary:     Effort: Pulmonary effort is normal.     Breath sounds: No wheezing. No rales. Diminished in the bases Cardiovascular:     Normal rate. Regular rhythm. Normal S1. Normal S2.      Murmurs: There is no murmur.  Edema:    Peripheral edema absent.  Abdominal:     Palpations: Abdomen is soft non tender. There is no hepatomegaly.  Skin:    General: Skin is warm and dry.  Neurological:     General: No focal deficit present.     Mental Status: Alert and oriented to person, place and time.     Cranial Nerves: Cranial nerves are intact.  EKG/LABS/Other Studies Reviewed    ECG personally reviewed by me today - ***  Risk Assessment/Calculations:   {Does this patient have ATRIAL FIBRILLATION?[phone]}        Lab Results  Component Value Date   WBC 9.3 03/10/2021   HGB 13.1 03/10/2021   HCT 41.3 03/10/2021   MCV 92.8 03/10/2021   PLT 251 03/10/2021   Lab Results  Component Value Date   CREATININE 0.95 03/10/2021   BUN 21 03/10/2021   NA 135 03/10/2021   K 3.7 03/10/2021   CL 102 03/10/2021   CO2  22 03/10/2021  Lab Results  Component Value Date   ALT 16 03/10/2021   AST 26 03/10/2021   ALKPHOS 70 03/10/2021   BILITOT 0.7 03/10/2021   Lab Results  Component Value Date   CHOL 107 10/02/2019   HDL 41 10/02/2019   LDLCALC 43 10/02/2019   TRIG 183 (H) 09/15/2020   CHOLHDL 2.6 10/02/2019    Lab Results  Component Value Date   HGBA1C 6.4 (H) 09/11/2020    Assessment & Plan    1.  Acute on chronic congestive heart failure: -Most recent 2D echo completed 11/2020 with EF of 60-65%, mild LVH, grade 1 DD, trivial TV regurgitation and no MV regurgitation -Patient recently admitted with exacerbation of CHF due to noncompliance with Lasix and was diuresed and discharged 3 days later -Current GDMT consist of Toprol 50 mg, losartan 50 mg,  2.  Coronary artery disease: -Previous VF arrest following inferior post STEMI 01/2009 treated with BMS with most recent ischemic evaluation completed 01/2016 with in-stent restenosis treated with balloon angioplasty -Continue GDMT with ASA 81 mg, Plavix 75 mg, Toprol 50 mg  3.  COPD: -Continue current treatment with bronchodilators per pulmonology  4.  Hypertension: -Blood pressure today was*** -Continue antihypertensive therapy with losartan and metoprolol as noted above      Disposition: Follow-up with Kathryn Grooms, MD or APP in *** months {Are you ordering a CV Procedure (e.g. stress test, cath, DCCV, TEE, etc)?   Press F2        :[id number]}   Medication Adjustments/Labs and Tests Ordered: Current medicines are reviewed at length with the patient today.  Concerns regarding medicines are outlined above.   Signed, Mable Fill, Marissa Nestle, NP 01/23/2022, 1:57 PM [place]

## 2022-01-24 ENCOUNTER — Ambulatory Visit: Payer: Medicare Other | Admitting: Nurse Practitioner

## 2022-01-24 NOTE — Progress Notes (Unsigned)
Office Visit    Patient Name: Kathryn Garrett Date of Encounter: 01/24/2022  Primary Care Provider:  Rhea Bleacher, NP Primary Cardiologist:  Larae Grooms, MD Primary Electrophysiologist: None  Chief Complaint    Kathryn Garrett is a 76 y.o. female with PMH of HLD, HTN, CAD s/p BMS to RCA 2012, VF arrest/posterior MI, COPD, lung nodule, HFrEF who presents today for hospital follow-up of CHF  Past Medical History    Past Medical History:  Diagnosis Date   Anemia, mild    Arthritis    "qwhere" (02/10/2016)   CAD (coronary artery disease)    a. VF arrest 01/2009/CAD with inferoposterior MI s/p aspiration thrombectomy/BMS of RCA at that time. b. ISR of BMS s/p DES to RCA 06/2010 with moderate LM/LAD disease not significant by FFR. c. s/p angiosculpt PTCA to prox RCA 01/2016 for ISR. d. Aggressive PTCA to prox RCA for ISR 05/2016.   Cardiac arrest ([place]) 01/2009   a. in setting of inf-post STEMI 01/2009 (VF).   Chest pain 06/04/2016   Chronic bronchitis (HCC)    Chronic combined systolic and diastolic CHF (congestive heart failure) ([place])    a. EF 40-45% by cath 2010. b. 60-65% with grade 1 DD by echo 09/2015   COPD (chronic obstructive pulmonary disease) ([place])    Hyperlipidemia 01/28/2016   Hyperlipidemia LDL goal <70 01/28/2016   Hypertension    Lung mass    MI (myocardial infarction) ([place]) 01/2009   Narcotic abuse ([place])    pt now taking Suboxone tid   Pleural effusion on left 09/24/2015   Pneumonia 06/2014; 09/2015   Pre-diabetes    Pulmonary nodule    PVD (peripheral vascular disease) (HCC)    mild atherosclerosis of infrarenal aorta, 25% ostial left renal artery stenosis, 50% ostial right common iliac by cath 2010   S/P PTCA (percutaneous transluminal coronary angioplasty) 02/10/16 to RCA lesion for in stent restenois 02/11/2016   Subclavian artery stenosis (HCC)    a. >50% by duplex 05/2016.   Tobacco abuse    Unstable angina [facility])    Past Surgical History:  Procedure  Laterality Date   ABDOMINAL HYSTERECTOMY     ANKLE FRACTURE SURGERY Right 2000s   CARDIAC CATHETERIZATION N/A 02/10/2016   Procedure: Left Heart Cath and Coronary Angiography;  Surgeon: Jettie Booze, MD;  Location: [facility];  Service: Cardiovascular;  Laterality: N/A;   CARDIAC CATHETERIZATION N/A 02/10/2016   Procedure: Coronary Balloon Angioplasty;  Surgeon: Jettie Booze, MD;  Location: [facility] [place] [facility];  Service: Cardiovascular;  Laterality: N/A;  instent RCA   CARDIAC CATHETERIZATION N/A 06/05/2016   Procedure: Left Heart Cath and Coronary Angiography;  Surgeon: Leonie Man, MD;  Location: [place] [facility];  Service: Cardiovascular;  Laterality: N/A;   CARDIAC CATHETERIZATION N/A 06/05/2016   Procedure: Coronary Balloon Angioplasty;  Surgeon: Leonie Man, MD;  Location: [place];  Service: Cardiovascular;  Laterality: N/A;   CHEST TUBE INSERTION N/A 10/08/2015   Procedure: PLEURX CATH REMOVAL;  Surgeon: [name], MD;  Location: [facility];  Service: Thoracic;  Laterality: N/A;   CORONARY ANGIOPLASTY WITH STENT PLACEMENT  01/2009; 2012   FRACTURE SURGERY     RIGHT/LEFT HEART CATH AND CORONARY ANGIOGRAPHY N/A 09/24/2020   Procedure: RIGHT/LEFT HEART CATH AND CORONARY ANGIOGRAPHY;  Surgeon: Nelva Bush, MD;  Location: [facility] [facility];  Service: Cardiovascular;  Laterality: N/A;   VIDEO ASSISTED THORACOSCOPY (VATS)/THOROCOTOMY Left 09/29/2015  Procedure: PREOP BRONCHOSCOPY, LEFT THORACOSCOPY, POSSIBLE THORACOTOMY, PLEURAL BIOPSY, TALC;  Surgeon: [name], MD;  Location: [facility];  Service: General;  Laterality: Left;    Allergies  Allergies  Allergen Reactions   Azithromycin Rash    History of Present Illness    Kathryn Garrett has a PMH of is a 76 year old female with the above mention past medical history who presents today for follow-up of CHF. Kathryn Garrett has an extensive coronary history with previous VF arrest following  inferior lateral posterior STEMI in 01/2009 that was treated with BMS to RCA.  She underwent left heart cath in 2012 with DES to proximal mid RCA for in-stent restenosis.  In 05/2016 LHC was performed and showed in-stent restenosis within the proximal RCA that was treated with PTCA with scoring balloon.  She was hospitalized 08/2020 with acute hypoxic respiratory failure secondary to COPD.  She developed flash pulmonary edema and had NSTEMI with R/LHC performed showing mild to moderate nonobstructive CAD, normal pulmonary and filling pressures with normal cardiac index.  She developed bacteremia and was consulted by infectious disease and treated with meropenem.  She had 2D echo completed 7/10/202260 to 65%, no R WMA, mild LVH, grade 1 diastolic dysfunction, RV normal size and function, normal PASP, trivial MR. She was last seen by Laurann Montana, NP on 05/2021 for follow-up.  She was doing well and had no reports of shortness of breath or dyspnea.  She noted a 7 pound weight gain but attributed to not walking as much.  She also reported stopping her Crestor due to myalgias.  Crestor was resumed and patient instructed to contact office if myalgias returned.     She was admitted at [facility] in [place] on 01/17/2022 due to acute on chronic CHF exacerbation.  She arrived via EMS with complaints of not being able to breathe.  She reported noncompliance with Lasix because she did not have any swelling.  Chest x-ray was completed that showed mild pulmonary vascular congestion.  She was started on IV Lasix and diuresed and was transitioned to p.o. Lasix 40 mg daily.  She was discharged on 9/8 in stable condition.  Kathryn Garrett presents today for post hospital follow-up alone.  Since last being seen in the office patient reports she is doing much better and is euvolemic today during examination.  She is currently tolerating medications without any adverse reactions or side effects.  Her Lasix was  changed to 40 mg daily during recent hospitalization in [place].  Patient denies chest pain, palpitations, dyspnea, PND, orthopnea, nausea, vomiting, dizziness, syncope, edema, weight gain, or early satiety.  Home Medications    Current Outpatient Medications  Medication Sig Dispense Refill   acetaminophen (TYLENOL) 500 MG tablet Take 1,000 mg by mouth every 6 (six) hours as needed for headache.     albuterol (PROVENTIL HFA;VENTOLIN HFA) 108 (90 Base) MCG/ACT inhaler Inhale 2 puffs into the lungs every 4 (four) hours as needed for wheezing or shortness of breath.     aspirin EC 81 MG tablet Take 81 mg by mouth at bedtime.      azelastine (ASTELIN) 0.1 % nasal spray Place 2 sprays into both nostrils daily.  0   Buprenorphine HCl-Naloxone HCl 8-2 MG FILM Place 0.5 Film under the tongue daily.     cetirizine (ZYRTEC) 10 MG tablet Take 1 tablet (10 mg total) by mouth daily. 30 tablet 0   clopidogrel (PLAVIX) 75 MG tablet TAKE 1 TABLET(75 MG) BY  MOUTH DAILY 90 tablet 2   empagliflozin (JARDIANCE) 10 MG TABS tablet Take 1 tablet (10 mg total) by mouth daily. 90 tablet 1   fluticasone (FLONASE) 50 MCG/ACT nasal spray Place 1 spray into both nostrils daily.     food thickener (THICK IT) POWD Use as needed for honey thick liquids 1020 g 0   furosemide (LASIX) 20 MG tablet Take 1 tablet (20 mg total) by mouth as needed for fluid or edema. (Patient taking differently: Take 20 mg by mouth daily as needed for fluid or edema.) 30 tablet 5   levothyroxine (SYNTHROID) 88 MCG tablet Take 88 mcg by mouth daily.     losartan (COZAAR) 50 MG tablet Take 1 tablet (50 mg total) by mouth daily. 90 tablet 2   metoprolol succinate (TOPROL-XL) 50 MG 24 hr tablet Take 1 tablet (50 mg total) by mouth daily. Take with or immediately following a meal. 90 tablet 2   nitroGLYCERIN (NITROSTAT) 0.4 MG SL tablet Place 1 tablet (0.4 mg total) under the tongue every 5 (five) minutes as needed for chest pain. 25 tablet 3    pantoprazole (PROTONIX) 40 MG tablet Take 1 tablet (40 mg total) by mouth daily. Further refills per PCP. (Patient taking differently: Take 40 mg by mouth daily.) 30 tablet 2   Potassium Chloride ER 20 MEQ TBCR Take 1 tablet by mouth daily.     rosuvastatin (CRESTOR) 20 MG tablet Take 1 tablet (20 mg total) by mouth daily. 90 tablet 3   TRELEGY ELLIPTA 100-62.5-25 MCG/INH AEPB Take 1 puff by mouth daily.     triamcinolone cream (KENALOG) 0.1 % Apply 1 application topically 2 (two) times daily. to affected area     No current facility-administered medications for this visit.     Review of Systems  Please see the history of present illness.     All other systems reviewed and are otherwise negative except as noted above.  Physical Exam    Wt Readings from Last 3 Encounters:  06/07/21 142 lb (64.4 kg)  03/10/21 135 lb (61.2 kg)  03/02/21 135 lb 6.4 oz (61.4 kg)   ZJ:QBHAL were no vitals filed for this visit.,There is no height or weight on file to calculate BMI.  Constitutional:      Appearance: Healthy appearance. Not in distress.  Neck:     Vascular: JVD normal.  Pulmonary:     Effort: Pulmonary effort is normal.     Breath sounds: No wheezing. No rales. Diminished in the bases Cardiovascular:     Normal rate. Regular rhythm. Normal S1. Normal S2.      Murmurs: There is no murmur.  Edema:    Peripheral edema absent.  Abdominal:     Palpations: Abdomen is soft non tender. There is no hepatomegaly.  Skin:    General: Skin is warm and dry.  Neurological:     General: No focal deficit present.     Mental Status: Alert and oriented to person, place and time.     Cranial Nerves: Cranial nerves are intact.  EKG/LABS/Other Studies Reviewed    ECG personally reviewed by me today -sinus rhythm with rate of 68 bpm with normal axis and no acute changes   Lab Results  Component Value Date   WBC 9.3 03/10/2021   HGB 13.1 03/10/2021   HCT 41.3 03/10/2021   MCV 92.8 03/10/2021    PLT 251 03/10/2021   Lab Results  Component Value Date   CREATININE 0.95 03/10/2021  BUN 21 03/10/2021   NA 135 03/10/2021   K 3.7 03/10/2021   CL 102 03/10/2021   CO2 22 03/10/2021   Lab Results  Component Value Date   ALT 16 03/10/2021   AST 26 03/10/2021   ALKPHOS 70 03/10/2021   BILITOT 0.7 03/10/2021   Lab Results  Component Value Date   CHOL 107 10/02/2019   HDL 41 10/02/2019   LDLCALC 43 10/02/2019   TRIG 183 (H) 09/15/2020   CHOLHDL 2.6 10/02/2019    Lab Results  Component Value Date   HGBA1C 6.4 (H) 09/11/2020    Assessment & Plan   1.  Acute on chronic congestive heart failure: -Most recent 2D echo completed 11/2020 with EF of 60-65%, mild LVH, grade 1 DD, trivial TV regurgitation and no MV regurgitation -Patient is NYHA class II and is euvolemic on examination -Patient recently admitted with exacerbation of CHF due to noncompliance with Lasix and was diuresed and discharged 3 days later -Current GDMT consist of Toprol XL 50 mg, losartan 50 mg, and Lasix 40 mg daily.  -We will check BMET and magnesium today Low sodium diet, fluid restriction <2L, and daily weights encouraged. Educated to contact our office for weight gain of 2 lbs overnight or 5 lbs in one week.     2.  Coronary artery disease: -Previous VF arrest following inferior post STEMI 01/2009 treated with BMS with most recent ischemic evaluation completed 01/2016 with in-stent restenosis treated with balloon angioplasty -Patient reports no chest pain, dizziness, palpitations today. -Continue GDMT with ASA 81 mg, Plavix 75 mg, Toprol 50 mg   3.  COPD: -Continue current treatment with bronchodilators per pulmonology   4.  Hypertension: -Blood pressure today was well controlled at 120/60 -Continue antihypertensive therapy with losartan and metoprolol as noted above   Disposition: Follow-up with Larae Grooms, MD or APP in 1 months   Medication Adjustments/Labs and Tests Ordered: Current  medicines are reviewed at length with the patient today.  Concerns regarding medicines are outlined above.   Signed, Mable Fill, Marissa Nestle, NP 01/24/2022, 7:37 PM [place]

## 2022-01-25 ENCOUNTER — Encounter: Payer: Self-pay | Admitting: Nurse Practitioner

## 2022-01-25 ENCOUNTER — Ambulatory Visit: Payer: Medicare Other | Attending: Physician Assistant | Admitting: Nurse Practitioner

## 2022-01-25 ENCOUNTER — Other Ambulatory Visit (HOSPITAL_BASED_OUTPATIENT_CLINIC_OR_DEPARTMENT_OTHER): Payer: Self-pay

## 2022-01-25 VITALS — BP 120/60 | HR 68 | Ht 59.0 in | Wt 147.0 lb

## 2022-01-25 DIAGNOSIS — I25118 Atherosclerotic heart disease of native coronary artery with other forms of angina pectoris: Secondary | ICD-10-CM | POA: Diagnosis not present

## 2022-01-25 DIAGNOSIS — I5022 Chronic systolic (congestive) heart failure: Secondary | ICD-10-CM

## 2022-01-25 DIAGNOSIS — J449 Chronic obstructive pulmonary disease, unspecified: Secondary | ICD-10-CM | POA: Diagnosis not present

## 2022-01-25 DIAGNOSIS — I10 Essential (primary) hypertension: Secondary | ICD-10-CM | POA: Diagnosis not present

## 2022-01-25 MED ORDER — LOSARTAN POTASSIUM 50 MG PO TABS: 50.0000 mg | ORAL_TABLET | Freq: Every day | ORAL | 0 refills | Status: DC

## 2022-01-25 NOTE — Telephone Encounter (Signed)
Received fax from [facility] ([place], [place]) requesting refills for Losartan 50 mg. Pt of Dr. Irish Lack. Please review for refill. Thank you!

## 2022-01-25 NOTE — Patient Instructions (Signed)
Medication Instructions:  Your physician recommends that you continue on your current medications as directed. Please refer to the Current Medication list given to you today. *If you need a refill on your cardiac medications before your next appointment, please call your pharmacy*   Lab Work: TODAY-BMET, MAG If you have labs (blood work) drawn today and your tests are completely normal, you will receive your results only by: [place] (if you have MyChart) OR A paper copy in the mail If you have any lab test that is abnormal or we need to change your treatment, we will call you to review the results.   Testing/Procedures: NONE ORDERED   Follow-Up: At [facility], you and your health needs are our priority.  As part of our continuing mission to provide you with exceptional heart care, we have created designated Provider Care Teams.  These Care Teams include your primary Cardiologist (physician) and Advanced Practice Providers (APPs -  Physician Assistants and Nurse Practitioners) who all work together to provide you with the care you need, when you need it.  We recommend signing up for the patient portal called "MyChart".  Sign up information is provided on this After Visit Summary.  MyChart is used to connect with patients for Virtual Visits (Telemedicine).  Patients are able to view lab/test results, encounter notes, upcoming appointments, etc.  Non-urgent messages can be sent to your provider as well.   To learn more about what you can do with MyChart, go to [email or web address].    Your next appointment:   FOLLOW UP AS SCHEDULED  The format for your next appointment:   In Person  Provider:   Larae Grooms, MD     Other Instructions   Important Information About Sugar

## 2022-01-26 ENCOUNTER — Telehealth: Payer: Self-pay

## 2022-01-26 DIAGNOSIS — I25118 Atherosclerotic heart disease of native coronary artery with other forms of angina pectoris: Secondary | ICD-10-CM

## 2022-01-26 DIAGNOSIS — I10 Essential (primary) hypertension: Secondary | ICD-10-CM

## 2022-01-26 DIAGNOSIS — I5022 Chronic systolic (congestive) heart failure: Secondary | ICD-10-CM

## 2022-01-26 LAB — BASIC METABOLIC PANEL
BUN/Creatinine Ratio: 28 (ref 12–28)
BUN: 32 mg/dL — ABNORMAL HIGH (ref 8–27)
CO2: 23 mmol/L (ref 20–29)
Calcium: 9.5 mg/dL (ref 8.7–10.3)
Chloride: 103 mmol/L (ref 96–106)
Creatinine, Ser: 1.16 mg/dL — ABNORMAL HIGH (ref 0.57–1.00)
Glucose: 108 mg/dL — ABNORMAL HIGH (ref 70–99)
Potassium: 5.1 mmol/L (ref 3.5–5.2)
Sodium: 140 mmol/L (ref 134–144)
eGFR: 49 mL/min/{1.73_m2} — ABNORMAL LOW (ref >59)

## 2022-01-26 LAB — MAGNESIUM: Magnesium: 2.8 mg/dL — ABNORMAL HIGH (ref 1.6–2.3)

## 2022-01-26 NOTE — Telephone Encounter (Signed)
-----   Message from Marylu Lund., NP sent at 01/26/2022  4:27 PM EDT ----- Please let patient know that her renal function and magnesium are elevated due to dehydration from increased Lasix.  Please have her increase her fluid intake and recheck BMET and CBC in 1 week  Ambrose Pancoast, NP

## 2022-01-26 NOTE — Telephone Encounter (Signed)
The patient has been notified of the result and verbalized understanding.  All questions (if any) were answered. Precious Gilding, RN 01/26/2022 4:37 PM  F/u labs scheduled for 02/01/22.

## 2022-01-27 ENCOUNTER — Ambulatory Visit: Payer: Medicare HMO | Admitting: Physician Assistant

## 2022-02-01 ENCOUNTER — Ambulatory Visit: Payer: Medicare Other | Attending: Cardiology

## 2022-02-01 DIAGNOSIS — I25118 Atherosclerotic heart disease of native coronary artery with other forms of angina pectoris: Secondary | ICD-10-CM

## 2022-02-01 DIAGNOSIS — I5022 Chronic systolic (congestive) heart failure: Secondary | ICD-10-CM

## 2022-02-01 DIAGNOSIS — I10 Essential (primary) hypertension: Secondary | ICD-10-CM

## 2022-02-02 LAB — BASIC METABOLIC PANEL
BUN/Creatinine Ratio: 21 (ref 12–28)
BUN: 29 mg/dL — ABNORMAL HIGH (ref 8–27)
CO2: 21 mmol/L (ref 20–29)
Calcium: 9 mg/dL (ref 8.7–10.3)
Chloride: 104 mmol/L (ref 96–106)
Creatinine, Ser: 1.41 mg/dL — ABNORMAL HIGH (ref 0.57–1.00)
Glucose: 146 mg/dL — ABNORMAL HIGH (ref 70–99)
Potassium: 4.4 mmol/L (ref 3.5–5.2)
Sodium: 142 mmol/L (ref 134–144)
eGFR: 38 mL/min/{1.73_m2} — ABNORMAL LOW (ref >59)

## 2022-02-02 LAB — CBC
Hematocrit: 32 % — ABNORMAL LOW (ref 34.0–46.6)
Hemoglobin: 10.9 g/dL — ABNORMAL LOW (ref 11.1–15.9)
MCH: 32 pg (ref 26.6–33.0)
MCHC: 34.1 g/dL (ref 31.5–35.7)
MCV: 94 fL (ref 79–97)
Platelets: 287 10*3/uL (ref 150–450)
RBC: 3.41 x10E6/uL — ABNORMAL LOW (ref 3.77–5.28)
RDW: 12.6 % (ref 11.7–15.4)
WBC: 10.8 10*3/uL (ref 3.4–10.8)

## 2022-02-03 ENCOUNTER — Other Ambulatory Visit: Payer: Self-pay | Admitting: *Deleted

## 2022-02-03 ENCOUNTER — Telehealth: Payer: Self-pay | Admitting: Nurse Practitioner

## 2022-02-03 DIAGNOSIS — I10 Essential (primary) hypertension: Secondary | ICD-10-CM

## 2022-02-03 DIAGNOSIS — E876 Hypokalemia: Secondary | ICD-10-CM

## 2022-02-03 DIAGNOSIS — I5022 Chronic systolic (congestive) heart failure: Secondary | ICD-10-CM

## 2022-02-03 NOTE — Telephone Encounter (Signed)
Patient is returning LPN's call for her lab results.

## 2022-02-03 NOTE — Telephone Encounter (Signed)
SEE LAB RESULTS .Kathryn Garrett

## 2022-02-13 ENCOUNTER — Ambulatory Visit (INDEPENDENT_AMBULATORY_CARE_PROVIDER_SITE_OTHER): Payer: Medicare Other | Admitting: Family

## 2022-02-13 ENCOUNTER — Encounter (HOSPITAL_BASED_OUTPATIENT_CLINIC_OR_DEPARTMENT_OTHER): Payer: Self-pay | Admitting: Family

## 2022-02-13 ENCOUNTER — Other Ambulatory Visit (INDEPENDENT_AMBULATORY_CARE_PROVIDER_SITE_OTHER): Payer: Medicare Other

## 2022-02-13 VITALS — BP 150/80 | HR 97 | Ht 59.0 in | Wt 153.4 lb

## 2022-02-13 DIAGNOSIS — E785 Hyperlipidemia, unspecified: Secondary | ICD-10-CM

## 2022-02-13 DIAGNOSIS — I25118 Atherosclerotic heart disease of native coronary artery with other forms of angina pectoris: Secondary | ICD-10-CM | POA: Diagnosis not present

## 2022-02-13 DIAGNOSIS — E876 Hypokalemia: Secondary | ICD-10-CM

## 2022-02-13 DIAGNOSIS — R002 Palpitations: Secondary | ICD-10-CM | POA: Diagnosis not present

## 2022-02-13 DIAGNOSIS — I10 Essential (primary) hypertension: Secondary | ICD-10-CM

## 2022-02-13 DIAGNOSIS — I5022 Chronic systolic (congestive) heart failure: Secondary | ICD-10-CM

## 2022-02-13 MED ORDER — POTASSIUM CHLORIDE ER 20 MEQ PO TBCR: 1.0000 | EXTENDED_RELEASE_TABLET | Freq: Every day | ORAL | 3 refills | Status: DC

## 2022-02-13 MED ORDER — CLOPIDOGREL BISULFATE 75 MG PO TABS: 75.0000 mg | ORAL_TABLET | Freq: Every day | ORAL | 3 refills | Status: AC

## 2022-02-13 MED ORDER — EMPAGLIFLOZIN 10 MG PO TABS: 10.0000 mg | ORAL_TABLET | Freq: Every day | ORAL | 3 refills | Status: DC

## 2022-02-13 MED ORDER — METOPROLOL SUCCINATE ER 50 MG PO TB24: 50.0000 mg | ORAL_TABLET | Freq: Every day | ORAL | 3 refills | Status: DC

## 2022-02-13 MED ORDER — ROSUVASTATIN CALCIUM 20 MG PO TABS: 20.0000 mg | ORAL_TABLET | Freq: Every day | ORAL | 3 refills | Status: DC

## 2022-02-13 NOTE — Patient Instructions (Addendum)
Medication Instructions:  Your Physician recommend you continue on your current medication as directed.    *If you need a refill on your cardiac medications before your next appointment, please call your pharmacy*   Lab Work: Your physician recommends that you return for lab work today- BMP, CBC, and Magnesium   If you have labs (blood work) drawn today and your tests are completely normal, you will receive your results only by: MyChart Message (if you have MyChart) OR A paper copy in the mail If you have any lab test that is abnormal or we need to change your treatment, we will call you to review the results.   Testing/Procedures: Your physician has recommended that you wear a Zio monitor.   This monitor is a medical device that records the heart's electrical activity. Doctors most often use these monitors to diagnose arrhythmias. Arrhythmias are problems with the speed or rhythm of the heartbeat. The monitor is a small device applied to your chest. You can wear one while you do your normal daily activities. While wearing this monitor if you have any symptoms to push the button and record what you felt. Once you have worn this monitor for the period of time provider prescribed (Usually 14 days), you will return the monitor device in the postage paid box. Once it is returned they will download the data collected and provide [place] with a report which the provider will then review and we will call you with those results. Important tips:  Avoid showering during the first 24 hours of wearing the monitor. Avoid excessive sweating to help maximize wear time. Do not submerge the device, no hot tubs, and no swimming pools. Keep any lotions or oils away from the patch. After 24 hours you may shower with the patch on. Take brief showers with your back facing the shower head.  Do not remove patch once it has been placed because that will interrupt data and decrease adhesive wear time. Push the button  when you have any symptoms and write down what you were feeling. Once you have completed wearing your monitor, remove and place into box which has postage paid and place in your outgoing mailbox.  If for some reason you have misplaced your box then call our office and we can provide another box and/or mail it off for you.      Follow-Up: At [facility], you and your health needs are our priority.  As part of our continuing mission to provide you with exceptional heart care, we have created designated Provider Care Teams.  These Care Teams include your primary Cardiologist (physician) and Advanced Practice Providers (APPs -  Physician Assistants and Nurse Practitioners) who all work together to provide you with the care you need, when you need it.  We recommend signing up for the patient portal called "MyChart".  Sign up information is provided on this After Visit Summary.  MyChart is used to connect with patients for Virtual Visits (Telemedicine).  Patients are able to view lab/test results, encounter notes, upcoming appointments, etc.  Non-urgent messages can be sent to your provider as well.   To learn more about what you can do with MyChart, go to [email or web address].    Your next appointment:   Follow up 3-4 months in person or virtual with Dr. Beau Fanny or Laurann Montana, NP   Other Instructions Heart Healthy Diet Recommendations: A low-salt diet is recommended. Meats should be grilled, baked, or boiled. Avoid fried foods. Focus on lean  protein sources like fish or chicken with vegetables and fruits. The American Heart Association is a Microbiologist!  American Heart Association Diet and Lifeystyle Recommendations   Exercise recommendations: The American Heart Association recommends 150 minutes of moderate intensity exercise weekly. Try 30 minutes of moderate intensity exercise 4-5 times per week. This could include walking, jogging, or swimming.   Important Information  About Sugar

## 2022-02-13 NOTE — Progress Notes (Signed)
Office Visit    Patient Name: Kathryn Garrett Date of Encounter: 02/13/2022  PCP:  Rhea Bleacher, NP   [place]  Cardiologist:  Larae Grooms, MD  Advanced Practice Provider:  No care team member to display Electrophysiologist:  None  Chief Complaint    Kathryn Garrett is a 76 y.o. female presents today for follow up of heart failure.   Past Medical History    Past Medical History:  Diagnosis Date   Anemia, mild    Arthritis    "qwhere" (02/10/2016)   CAD (coronary artery disease)    a. VF arrest 01/2009/CAD with inferoposterior MI s/p aspiration thrombectomy/BMS of RCA at that time. b. ISR of BMS s/p DES to RCA 06/2010 with moderate LM/LAD disease not significant by FFR. c. s/p angiosculpt PTCA to prox RCA 01/2016 for ISR. d. Aggressive PTCA to prox RCA for ISR 05/2016.   Cardiac arrest ([place]) 01/2009   a. in setting of inf-post STEMI 01/2009 (VF).   Chest pain 06/04/2016   Chronic bronchitis (HCC)    Chronic combined systolic and diastolic CHF (congestive heart failure) ([place])    a. EF 40-45% by cath 2010. b. 60-65% with grade 1 DD by echo 09/2015   COPD (chronic obstructive pulmonary disease) ([place])    Hyperlipidemia 01/28/2016   Hyperlipidemia LDL goal <70 01/28/2016   Hypertension    Lung mass    MI (myocardial infarction) ([place]) 01/2009   Narcotic abuse ([place])    pt now taking Suboxone tid   Pleural effusion on left 09/24/2015   Pneumonia 06/2014; 09/2015   Pre-diabetes    Pulmonary nodule    PVD (peripheral vascular disease) (HCC)    mild atherosclerosis of infrarenal aorta, 25% ostial left renal artery stenosis, 50% ostial right common iliac by cath 2010   S/P PTCA (percutaneous transluminal coronary angioplasty) 02/10/16 to RCA lesion for in stent restenois 02/11/2016   Subclavian artery stenosis (HCC)    a. >50% by duplex 05/2016.   Tobacco abuse    Unstable angina [facility])    Past Surgical History:  Procedure Laterality Date   ABDOMINAL  HYSTERECTOMY     ANKLE FRACTURE SURGERY Right 2000s   CARDIAC CATHETERIZATION N/A 02/10/2016   Procedure: Left Heart Cath and Coronary Angiography;  Surgeon: Jettie Booze, MD;  Location: [place] CV LAB;  Service: Cardiovascular;  Laterality: N/A;   CARDIAC CATHETERIZATION N/A 02/10/2016   Procedure: Coronary Balloon Angioplasty;  Surgeon: Jettie Booze, MD;  Location: [place] CV LAB;  Service: Cardiovascular;  Laterality: N/A;  instent RCA   CARDIAC CATHETERIZATION N/A 06/05/2016   Procedure: Left Heart Cath and Coronary Angiography;  Surgeon: Leonie Man, MD;  Location: [place] CV LAB;  Service: Cardiovascular;  Laterality: N/A;   CARDIAC CATHETERIZATION N/A 06/05/2016   Procedure: Coronary Balloon Angioplasty;  Surgeon: Leonie Man, MD;  Location: [place] CV LAB;  Service: Cardiovascular;  Laterality: N/A;   CHEST TUBE INSERTION N/A 10/08/2015   Procedure: PLEURX CATH REMOVAL;  Surgeon: [name], MD;  Location: [facility] ORS;  Service: Thoracic;  Laterality: N/A;   CORONARY ANGIOPLASTY WITH STENT PLACEMENT  01/2009; 2012   FRACTURE SURGERY     RIGHT/LEFT HEART CATH AND CORONARY ANGIOGRAPHY N/A 09/24/2020   Procedure: RIGHT/LEFT HEART CATH AND CORONARY ANGIOGRAPHY;  Surgeon: Nelva Bush, MD;  Location: [place] CV LAB;  Service: Cardiovascular;  Laterality: N/A;   VIDEO ASSISTED THORACOSCOPY (VATS)/THOROCOTOMY Left 09/29/2015   Procedure: PREOP  BRONCHOSCOPY, LEFT THORACOSCOPY, POSSIBLE THORACOTOMY, PLEURAL BIOPSY, TALC;  Surgeon: [name], MD;  Location: [facility] ORS;  Service: General;  Laterality: Left;    Allergies  Allergies  Allergen Reactions   Azithromycin Rash    History of Present Illness    Kathryn Garrett is a 76 y.o. female with a hx of CAD s/p DES to RCA, hypertension, hyperlipidemia, COPD, HFrEF/ICM last seen 01/25/22 by Ambrose Pancoast, NP.  Her CAD dates back to inferior MI treated with bare metal stent to RCA in 2010. In 06/2010 she  had DES to proximal to mid RCA for ISR of the BMS. Her LM and LAD disease was moderate and not significant. Admitted 01/2016 for unstable angina with cardiac cath showing ISR of RCA for which she underwent balloon angioplasty with ISR 75% ? 0% and Plavix was resumed. She had unstable angina again 05/2016 with cardiac cath showing ISR within proximal RCA treated with PTCA with scoring balloon. She had bruit on exam with subsequent carotid doppler showing bilateral ECA stenosis >50% but no significant ICA stenosis.   She had COVID 01/2020 requiring oxygen therapy. Started on Levothyroxine 05/2020 due to hypothyroidism. Hospitalized 09/10/20 with acute hypoxic respiratory failure secondary to COPD exacerbation complicated by flash pulmonary edema and NSTEMI. Echo 09/11/20 LVEF 25-30%, indeterminite diastolic parameters, mild MR. [facility] 09/24/20 showing mild to moderate nonobstructive CAD including  20-35% distal LMCA and 50% ostial LAD disease with FFR not hemodynamicaly significant, prox RCA stent with mild-moderat ICR (30-40%), normal pulmonary pressure, cardiac output/index, filling pressures. Hospital course complicated by gram negative bacteremia, evaluated by ID and treated with meropenem. Had SVT while admitted and was discharged on Toprol. Repeat echo 11/21/2020 LVEF 60 to 65%, no R WMA, mild LVH, grade 1 diastolic dysfunction, RV normal size and function, normal PASP, trivial MR.    Recurrent COVID 03/10/21 treated with Paxlovid which did not require admission.   Last seen 06/07/21. She was euvolemic. Crestor was resumed at lower dose of '20mg'$  QD due to reported myalgias.   Admitted 09/2021 for community acquired pneumonia and COPD exacerbation. ED visit 12/13/21 cellulitis after cutting herself with razor accidentally on her leg.   Admitted 9/5-01/19/22 with acute heart failure, diuresed with IV lasix. Noted home noncompliance with Lasix.  Discharged on lasix '40mg'$  QD, Losartan '50mg'$  QD, Metoprolol succiante '50mg'$ .    Follow up 02/01/22 clinic visit with creatinine 1.4, Lasix reduced to '20mg'$  QOD.   Presents today for follow up. She reports episodes of "panic" attacks that feels "swimmy headed" and as if she might pass out. Gives her a sensation of "something is going to happen". No associated shortness of breath nor chest pain. Tells me it feels like her heart is racing. They last 15 minutes and first noted about 2 weeks. Occurring once per week. Drinks 1-2 cups of coffee per day and then three 20 oz glasses of water or lemonade.  No chest pain. Stable exertional dyspnea. Notes bilateral pedal edema - she does elevate her legs but does not have compression stockings.   EKGs/Labs/Other Studies Reviewed:   The following studies were reviewed today:  Echo 11/24/20  1. Left ventricular ejection fraction, by estimation, is 60 to 65%. The  left ventricle has normal function. The left ventricle has no regional  wall motion abnormalities. There is mild concentric left ventricular  hypertrophy. Left ventricular diastolic  parameters are consistent with Grade I diastolic dysfunction (impaired  relaxation).   2. Right ventricular systolic function is normal. The right ventricular  size is normal. There is normal pulmonary artery systolic pressure. The  estimated right ventricular systolic pressure is 66.0 mmHg.   3. The mitral valve is normal in structure. Trivial mitral valve  regurgitation. No evidence of mitral stenosis.   4. The aortic valve is tricuspid. Aortic valve regurgitation is not  visualized. No aortic stenosis is present.   5. The inferior vena cava is normal in size with greater than 50%  respiratory variability, suggesting right atrial pressure of 3 mmHg.   Comparison(s): Compared to prior TTE in 08/2020, the LVEF has returned to  normal (60-65% from 25-30%).  L/RHC (09/24/2020): Mild to moderate, non-obstructive coronary artery disease including 20-35% distal LMCA and 50% ostial LAD disease,  similar to prior catheterization when FFR was not hemodynamically significant. Patent proximal RCA stents with mild to moderate in-stent restenosis (30-40%). Normal left and right heart filling pressures. Normal pulmonary artery pressures. Normal cardiac output/index.   TTE (09/11/2020):  1. Recommend limited echo with iv contrast agent (definity) for addequate  assessment of LVEF and wall motion.. Left ventricular ejection fraction,  by estimation, is 25 to 30%. The left ventricle has severely decreased  function. Left ventricular  endocardial border not optimally defined to evaluate regional wall motion.  Left ventricular diastolic parameters are indeterminate.   2. Right ventricular systolic function is normal. The right ventricular  size is normal.   3. The mitral valve was not well visualized. Mild mitral valve  regurgitation.   4. The aortic valve was not well visualized. Aortic valve regurgitation  is not visualized.  EKG:  No EKG today.   Recent Labs: 03/10/2021: ALT 16 01/25/2022: Magnesium 2.8 02/01/2022: BUN 29; Creatinine, Ser 1.41; Hemoglobin 10.9; Platelets 287; Potassium 4.4; Sodium 142  Recent Lipid Panel    Component Value Date/Time   CHOL 107 10/02/2019 1228   CHOL 85 03/20/2013 0557   TRIG 183 (H) 09/15/2020 0552   TRIG 112 03/20/2013 0557   HDL 41 10/02/2019 1228   HDL 21 (L) 03/20/2013 0557   CHOLHDL 2.6 10/02/2019 1228   CHOLHDL 3.5 06/05/2016 0911   VLDL 24 06/05/2016 0911   VLDL 22 03/20/2013 0557   LDLCALC 43 10/02/2019 1228   LDLCALC 42 03/20/2013 0557   Home Medications   Current Meds  Medication Sig   acetaminophen (TYLENOL) 500 MG tablet Take 1,000 mg by mouth every 6 (six) hours as needed for headache.   albuterol (PROVENTIL HFA;VENTOLIN HFA) 108 (90 Base) MCG/ACT inhaler Inhale 2 puffs into the lungs every 4 (four) hours as needed for wheezing or shortness of breath.   aspirin EC 81 MG tablet Take 81 mg by mouth at bedtime.    azelastine  (ASTELIN) 0.1 % nasal spray Place 2 sprays into both nostrils daily.   Buprenorphine HCl-Naloxone HCl 8-2 MG FILM Place 0.5 Film under the tongue daily.   cetirizine (ZYRTEC) 10 MG tablet Take 1 tablet (10 mg total) by mouth daily.   clopidogrel (PLAVIX) 75 MG tablet TAKE 1 TABLET(75 MG) BY MOUTH DAILY   empagliflozin (JARDIANCE) 10 MG TABS tablet Take 1 tablet (10 mg total) by mouth daily.   fluticasone (FLONASE) 50 MCG/ACT nasal spray Place 1 spray into both nostrils daily.   food thickener (THICK IT) POWD Use as needed for honey thick liquids   furosemide (LASIX) 40 MG tablet Take 20 mg by mouth every other day. MAY TAKE AN EXTRA 20 MG AS NEEDED FOR 2 LB WEIGHT GAIN IN 24 OR 5 LB WEIGHT GAIN  IN 5 DAYS    levothyroxine (SYNTHROID) 88 MCG tablet Take 88 mcg by mouth daily.   losartan (COZAAR) 50 MG tablet Take 1 tablet (50 mg total) by mouth daily.   metoprolol succinate (TOPROL-XL) 50 MG 24 hr tablet Take 1 tablet (50 mg total) by mouth daily. Take with or immediately following a meal.   pantoprazole (PROTONIX) 40 MG tablet Take 1 tablet (40 mg total) by mouth daily. Further refills per PCP. (Patient taking differently: Take 40 mg by mouth daily.)   Potassium Chloride ER 20 MEQ TBCR Take 1 tablet by mouth daily.   rosuvastatin (CRESTOR) 20 MG tablet Take 1 tablet (20 mg total) by mouth daily.   TRELEGY ELLIPTA 100-62.5-25 MCG/INH AEPB Take 1 puff by mouth daily.   triamcinolone cream (KENALOG) 0.1 % Apply 1 application topically 2 (two) times daily. to affected area    Review of Systems    All other systems reviewed and are otherwise negative except as noted above.  Physical Exam    VS:  BP (!) 168/78   Pulse 97   Ht '4\' 11"'$  (1.499 m)   Wt 153 lb 6.4 oz (69.6 kg)   BMI 30.98 kg/m  , BMI Body mass index is 30.98 kg/m.  Wt Readings from Last 3 Encounters:  02/13/22 153 lb 6.4 oz (69.6 kg)  01/25/22 147 lb (66.7 kg)  06/07/21 142 lb (64.4 kg)    GEN: Well nourished, well developed,  in no acute distress. HEENT: normal. Neck: Supple, no JVD, carotid bruits, or masses. Cardiac: RRR, no murmurs, rubs, or gallops. No clubbing, cyanosis, edema.  Radials/DP/PT 2+ and equal bilaterally.  Respiratory:  Respirations regular and unlabored, clear to auscultation bilaterally. GI: Soft, nontender, nondistended. MS: No deformity or atrophy. Skin: Warm and dry, no rash. Neuro:  Strength and sensation are intact. Psych: Normal affect.  Assessment & Plan    SVT / Sinus tachycardia -Notes palpitations. Continue Toprol '50mg'$  daily. 14 day ZIO placed in clinic.   HTN -BP well controlled at home though elevated in clinic today. Continue current antihypertensive regimen including Losartan '50mg'$  QD, Toprol 50QD.  She will report if BP consistently greater than 130/80 by home readings.   CAD s/p DES to RCA - RCA initially with BMS with ISR requiring DES and subsequent balloon angioplasty. LHC 09/2020 mild to moderate nonobstructive disease of distal LMCA and LAD with patent prox RCA stent mild to moderate ISR (30-40%). Stable with no anginal symptoms. No indication for ischemic evaluation. GDMT includes aspirin, crestor, toprol, jardiance, plavix. In setting of recurrent ISR would recommend continuation of DAPT. Heart healthy diet and regular cardiovascular exercise encouraged.   Chronic systolic and diastolic heart failure with recovered LVEF./ Ischemic cardiomyopathy - LVEF by echo 09/11/2020 of 25-30% in the setting of NSTEMI, heart failure exacerbation, bacteremia, complicated hospitalization.  Echocardiogram 11/2020 LVEF improved to 24-40%, grade 1 diastolic dysfunction, mild LVH, no significant valvular abnormality. GDMT includes Jardiance 10 mg daily, Toprol 50 mg daily, losartan 50 mg daily, Lasix 20 mg QOD with additional tablet PRN for edema. Update BMP, magnesium.  Continue current medications. Adjust diuretic pending lab results. Discussed importance of taking Lasix as prescribed.  Anemia -  Mild by most recent labs. Update CBC to ensure stable. Reports no melena, hematuria.   HLD, LDL goal <70 -Continue Crestor '20mg'$  QD. If she notes myalgias, she will contact our office and consider trial of Pravastatin vs referral to lipid clinic.   Tobacco use -Continued cessation encouraged.  Encouraged to utilize 1-800 quit now.   GERD - Continue Protonix.   COPD - No signs of acute exacerbations. Follow with PCP. Continue Trelegy.   Disposition: Follow up in 3 months with Dr. Christ Kick or APP.    Signed, Loel Dubonnet, NP 02/13/2022, 1:12 PM [facility]

## 2022-02-14 ENCOUNTER — Telehealth (HOSPITAL_BASED_OUTPATIENT_CLINIC_OR_DEPARTMENT_OTHER): Payer: Self-pay

## 2022-02-14 DIAGNOSIS — I25118 Atherosclerotic heart disease of native coronary artery with other forms of angina pectoris: Secondary | ICD-10-CM

## 2022-02-14 DIAGNOSIS — I5022 Chronic systolic (congestive) heart failure: Secondary | ICD-10-CM

## 2022-02-14 LAB — BASIC METABOLIC PANEL
BUN/Creatinine Ratio: 17 (ref 12–28)
BUN: 17 mg/dL (ref 8–27)
CO2: 20 mmol/L (ref 20–29)
Calcium: 9.4 mg/dL (ref 8.7–10.3)
Chloride: 108 mmol/L — ABNORMAL HIGH (ref 96–106)
Creatinine, Ser: 1.03 mg/dL — ABNORMAL HIGH (ref 0.57–1.00)
Glucose: 97 mg/dL (ref 70–99)
Potassium: 4.8 mmol/L (ref 3.5–5.2)
Sodium: 145 mmol/L — ABNORMAL HIGH (ref 134–144)
eGFR: 56 mL/min/{1.73_m2} — ABNORMAL LOW (ref >59)

## 2022-02-14 LAB — CBC
Hematocrit: 31.4 % — ABNORMAL LOW (ref 34.0–46.6)
Hemoglobin: 10.5 g/dL — ABNORMAL LOW (ref 11.1–15.9)
MCH: 30.4 pg (ref 26.6–33.0)
MCHC: 33.4 g/dL (ref 31.5–35.7)
MCV: 91 fL (ref 79–97)
Platelets: 256 10*3/uL (ref 150–450)
RBC: 3.45 x10E6/uL — ABNORMAL LOW (ref 3.77–5.28)
RDW: 12.5 % (ref 11.7–15.4)
WBC: 9.7 10*3/uL (ref 3.4–10.8)

## 2022-02-14 LAB — MAGNESIUM: Magnesium: 2.3 mg/dL (ref 1.6–2.3)

## 2022-02-14 MED ORDER — LOSARTAN POTASSIUM 50 MG PO TABS: 50.0000 mg | ORAL_TABLET | Freq: Every day | ORAL | 3 refills | Status: DC

## 2022-02-14 MED ORDER — FUROSEMIDE 20 MG PO TABS: 20.0000 mg | ORAL_TABLET | Freq: Every day | ORAL | 3 refills | Status: DC

## 2022-02-14 NOTE — Telephone Encounter (Addendum)
Left message for patient to call back    ----- Message from Loel Dubonnet, NP sent at 02/14/2022  7:47 AM EDT ----- Kidney function improved from previous.  CBC with stable mild anemia.  Normal magnesium.  Recommended adjusting Lasix to 20 mg daily.  Repeat BMP in 2 weeks.  We can mail her lab slips and she may be able to have collected at [place] in [place] where she lives ([address] [place], [employer])

## 2022-02-14 NOTE — Addendum Note (Signed)
Addended by: Gerald Stabs on: 02/14/2022 03:04 PM   Modules accepted: Orders

## 2022-02-14 NOTE — Telephone Encounter (Signed)
Did not need this encounetre

## 2022-02-14 NOTE — Telephone Encounter (Signed)
Patient returned call to the office, the following information was provided, patient verbalizes understanding.        ----- Message from Loel Dubonnet, NP sent at 02/14/2022  7:47 AM EDT ----- Kidney function improved from previous.  CBC with stable mild anemia.  Normal magnesium.   Recommended adjusting Lasix to 20 mg daily.  Repeat BMP in 2 weeks.   We can mail her lab slips and she may be able to have collected at [place] in [place] where she lives ([address], [place], [employer])

## 2022-03-15 LAB — HISTOPLASMA ANTIGEN, URINE: Histoplasma Antigen, urine: <0.5 (ref <0.5)

## 2022-03-23 ENCOUNTER — Telehealth (HOSPITAL_BASED_OUTPATIENT_CLINIC_OR_DEPARTMENT_OTHER): Payer: Self-pay

## 2022-03-23 NOTE — Telephone Encounter (Addendum)
Results called to patient who verbalizes understanding!    ----- Message from Loel Dubonnet, NP sent at 03/22/2022  4:50 PM EST ----- Monitor with predominantly normal sinus rhythm. 7 short episodes of a fast heart beat called SVT which is not dangerous and not of concern. No dangerous arrhythmias. Continue current medications.

## 2022-05-06 LAB — LAB REPORT - SCANNED
A1c: 6.3
EGFR: 51.6

## 2022-05-19 ENCOUNTER — Encounter (HOSPITAL_BASED_OUTPATIENT_CLINIC_OR_DEPARTMENT_OTHER): Payer: Self-pay | Admitting: Family

## 2022-05-19 ENCOUNTER — Ambulatory Visit (HOSPITAL_BASED_OUTPATIENT_CLINIC_OR_DEPARTMENT_OTHER): Payer: Medicare HMO | Admitting: Family

## 2022-05-19 VITALS — BP 138/80 | HR 80 | Ht 59.0 in | Wt 152.3 lb

## 2022-05-19 DIAGNOSIS — E785 Hyperlipidemia, unspecified: Secondary | ICD-10-CM | POA: Diagnosis not present

## 2022-05-19 DIAGNOSIS — I5022 Chronic systolic (congestive) heart failure: Secondary | ICD-10-CM

## 2022-05-19 DIAGNOSIS — I10 Essential (primary) hypertension: Secondary | ICD-10-CM

## 2022-05-19 DIAGNOSIS — I25118 Atherosclerotic heart disease of native coronary artery with other forms of angina pectoris: Secondary | ICD-10-CM | POA: Diagnosis not present

## 2022-05-19 DIAGNOSIS — I471 Supraventricular tachycardia, unspecified: Secondary | ICD-10-CM

## 2022-05-19 NOTE — Patient Instructions (Signed)
Medication Instructions:  Your physician has recommended you make the following change in your medication:   STOP Jardiance  CONTINUE Lasix '20mg'$  daily with additional '20mg'$  as needed for weight gain of 2 pounds overnight or 5 pounds in one week  *If you need a refill on your cardiac medications before your next appointment, please call your pharmacy*   Lab Work: None ordered today. We will request your labs from primary care.  Follow-Up: At [facility], you and your health needs are our priority.  As part of our continuing mission to provide you with exceptional heart care, we have created designated Provider Care Teams.  These Care Teams include your primary Cardiologist (physician) and Advanced Practice Providers (APPs -  Physician Assistants and Nurse Practitioners) who all work together to provide you with the care you need, when you need it.  We recommend signing up for the patient portal called "MyChart".  Sign up information is provided on this After Visit Summary.  MyChart is used to connect with patients for Virtual Visits (Telemedicine).  Patients are able to view lab/test results, encounter notes, upcoming appointments, etc.  Non-urgent messages can be sent to your provider as well.   To learn more about what you can do with MyChart, go to [email or web address].    Your next appointment:   4 month(s)  The format for your next appointment:   In Person  Provider:   Larae Grooms, MD  or Loel Dubonnet, NP    Other Instructions  To prevent or reduce lower extremity swelling: Eat a low salt diet. Salt makes the body hold onto extra fluid which causes swelling. Sit with legs elevated. For example, in the recliner or on an [place].  Wear knee-high compression stockings during the daytime. Ones labeled 15-20 mmHg provide good compression.  Heart Healthy Diet Recommendations: A low-salt diet is recommended. Meats should be grilled, baked, or boiled. Avoid fried  foods. Focus on lean protein sources like fish or chicken with vegetables and fruits. The American Heart Association is a Microbiologist!  American Heart Association Diet and Lifeystyle Recommendations   Exercise recommendations: The American Heart Association recommends 150 minutes of moderate intensity exercise weekly. Try 30 minutes of moderate intensity exercise 4-5 times per week. This could include walking, jogging, or swimming.  Tips to Measure your Blood Pressure Correctly   Here's what you can do to ensure a correct reading:  Don't drink a caffeinated beverage or smoke during the 30 minutes before the test.  Sit quietly for five minutes before the test begins.  During the measurement, sit in a chair with your feet on the floor and your arm supported so your elbow is at about heart level.  The inflatable part of the cuff should completely cover at least 80% of your upper arm, and the cuff should be placed on bare skin, not over a shirt.  Don't talk during the measurement.   Blood pressure categories  Blood pressure category SYSTOLIC (upper number)  DIASTOLIC (lower number)  Normal Less than 120 mm Hg and Less than 80 mm Hg  Elevated 120-129 mm Hg and Less than 80 mm Hg  High blood pressure: Stage 1 hypertension 130-139 mm Hg or 80-89 mm Hg  High blood pressure: Stage 2 hypertension 140 mm Hg or higher or 90 mm Hg or higher  Hypertensive crisis (consult your doctor immediately) Higher than 180 mm Hg and/or Higher than 120 mm Hg  Source: American Heart Association and American Stroke  Association. For more on getting your blood pressure under control, buy Controlling Your Blood Pressure, a Special Health Report from [facility].   Blood Pressure Log   Date   Time  Blood Pressure  Example: Nov 1 9 AM 124/78                                               Important Information About Sugar

## 2022-05-19 NOTE — Progress Notes (Signed)
Office Visit    Patient Name: Kathryn Garrett Date of Encounter: 05/19/2022  PCP:  Rhea Bleacher, NP   [employer]  Cardiologist:  Larae Grooms, MD  Advanced Practice Provider:  No care team member to display Electrophysiologist:  None  Chief Complaint    Kathryn Garrett is a 77 y.o. female presents today for follow up of after monitor  Past Medical History    Past Medical History:  Diagnosis Date   Anemia, mild    Arthritis    "qwhere" (02/10/2016)   CAD (coronary artery disease)    a. VF arrest 01/2009/CAD with inferoposterior MI s/p aspiration thrombectomy/BMS of RCA at that time. b. ISR of BMS s/p DES to RCA 06/2010 with moderate LM/LAD disease not significant by FFR. c. s/p angiosculpt PTCA to prox RCA 01/2016 for ISR. d. Aggressive PTCA to prox RCA for ISR 05/2016.   Cardiac arrest ([place]) 01/2009   a. in setting of inf-post STEMI 01/2009 (VF).   Chest pain 06/04/2016   Chronic bronchitis (HCC)    Chronic combined systolic and diastolic CHF (congestive heart failure) ([place])    a. EF 40-45% by cath 2010. b. 60-65% with grade 1 DD by echo 09/2015   COPD (chronic obstructive pulmonary disease) ([place])    Hyperlipidemia 01/28/2016   Hyperlipidemia LDL goal <70 01/28/2016   Hypertension    Lung mass    MI (myocardial infarction) ([place]) 01/2009   Narcotic abuse ([place])    pt now taking Suboxone tid   Pleural effusion on left 09/24/2015   Pneumonia 06/2014; 09/2015   Pre-diabetes    Pulmonary nodule    PVD (peripheral vascular disease) (HCC)    mild atherosclerosis of infrarenal aorta, 25% ostial left renal artery stenosis, 50% ostial right common iliac by cath 2010   S/P PTCA (percutaneous transluminal coronary angioplasty) 02/10/16 to RCA lesion for in stent restenois 02/11/2016   Subclavian artery stenosis (HCC)    a. >50% by duplex 05/2016.   Tobacco abuse    Unstable angina [facility])    Past Surgical History:  Procedure Laterality Date   ABDOMINAL  HYSTERECTOMY     ANKLE FRACTURE SURGERY Right 2000s   CARDIAC CATHETERIZATION N/A 02/10/2016   Procedure: Left Heart Cath and Coronary Angiography;  Surgeon: Jettie Booze, MD;  Location: [place] CV LAB;  Service: Cardiovascular;  Laterality: N/A;   CARDIAC CATHETERIZATION N/A 02/10/2016   Procedure: Coronary Balloon Angioplasty;  Surgeon: Jettie Booze, MD;  Location: [place] CV LAB;  Service: Cardiovascular;  Laterality: N/A;  instent RCA   CARDIAC CATHETERIZATION N/A 06/05/2016   Procedure: Left Heart Cath and Coronary Angiography;  Surgeon: Leonie Man, MD;  Location: [place] CV LAB;  Service: Cardiovascular;  Laterality: N/A;   CARDIAC CATHETERIZATION N/A 06/05/2016   Procedure: Coronary Balloon Angioplasty;  Surgeon: Leonie Man, MD;  Location: [place] CV LAB;  Service: Cardiovascular;  Laterality: N/A;   CHEST TUBE INSERTION N/A 10/08/2015   Procedure: PLEURX CATH REMOVAL;  Surgeon: [name], MD;  Location: [facility] ORS;  Service: Thoracic;  Laterality: N/A;   CORONARY ANGIOPLASTY WITH STENT PLACEMENT  01/2009; 2012   FRACTURE SURGERY     RIGHT/LEFT HEART CATH AND CORONARY ANGIOGRAPHY N/A 09/24/2020   Procedure: RIGHT/LEFT HEART CATH AND CORONARY ANGIOGRAPHY;  Surgeon: Nelva Bush, MD;  Location: [place] CV LAB;  Service: Cardiovascular;  Laterality: N/A;   VIDEO ASSISTED THORACOSCOPY (VATS)/THOROCOTOMY Left 09/29/2015   Procedure: PREOP BRONCHOSCOPY,  LEFT THORACOSCOPY, POSSIBLE THORACOTOMY, PLEURAL BIOPSY, TALC;  Surgeon: [name], MD;  Location: [facility] ORS;  Service: General;  Laterality: Left;    Allergies  Allergies  Allergen Reactions   Azithromycin Rash    History of Present Illness    Kathryn Garrett is a 77 y.o. female with a hx of CAD s/p DES to RCA, hypertension, hyperlipidemia, COPD, HFrEF/ICM last seen 02/13/22  Her CAD dates back to inferior MI treated with bare metal stent to RCA in 2010. In 06/2010 she had DES to proximal  to mid RCA for ISR of the BMS. Her LM and LAD disease was moderate and not significant. Admitted 01/2016 for unstable angina with cardiac cath showing ISR of RCA for which she underwent balloon angioplasty with ISR 75% ? 0% and Plavix was resumed. She had unstable angina again 05/2016 with cardiac cath showing ISR within proximal RCA treated with PTCA with scoring balloon. She had bruit on exam with subsequent carotid doppler showing bilateral ECA stenosis >50% but no significant ICA stenosis.   She had COVID 01/2020 requiring oxygen therapy. Started on Levothyroxine 05/2020 due to hypothyroidism. Hospitalized 09/10/20 with acute hypoxic respiratory failure secondary to COPD exacerbation complicated by flash pulmonary edema and NSTEMI. Echo 09/11/20 LVEF 25-30%, indeterminite diastolic parameters, mild MR. [facility] 09/24/20 showing mild to moderate nonobstructive CAD including  20-35% distal LMCA and 50% ostial LAD disease with FFR not hemodynamicaly significant, prox RCA stent with mild-moderat ICR (30-40%), normal pulmonary pressure, cardiac output/index, filling pressures. Hospital course complicated by gram negative bacteremia, evaluated by ID and treated with meropenem. Had SVT while admitted and was discharged on Toprol. Repeat echo 11/21/2020 LVEF 60 to 65%, no R WMA, mild LVH, grade 1 diastolic dysfunction, RV normal size and function, normal PASP, trivial MR.    Recurrent COVID 03/10/21 treated with Paxlovid which did not require admission.   Seen 06/07/21. Crestor was resumed at lower dose of '20mg'$  QD due to reported myalgias.   Admitted 09/2021 for community acquired pneumonia and COPD exacerbation. ED visit 12/13/21 cellulitis after cutting herself with razor accidentally on her leg.   Admitted 9/5-01/19/22 with acute heart failure, diuresed with IV lasix. Noted home noncompliance with Lasix.  Discharged on lasix '40mg'$  QD, Losartan '50mg'$  QD, Metoprolol succiante '50mg'$ .  Lasix later reduced to 20 mg every other day  due to elevated creatinine  Last seen 02/2022 noting palpitations.  Subsequent ZIO monitor predominantly normal sinus rhythm with 7 short runs of SVT which were not triggered episodes.  She presents today for follow up. She worries that Vania Rea might be causing urinary tract infections as she has had 1 every other month since starting.  She is presently on antibiotics with improvement in her dysuria.  We agreed to stop Jardiance.  Otherwise she is feeling well.  She is spending time in [place] with her children as well as in [place]. Reports no shortness of breath nor dyspnea on exertion. Reports no chest pain, pressure, or tightness. No edema, orthopnea, PND. Reports no palpitations.  .   EKGs/Labs/Other Studies Reviewed:   The following studies were reviewed today:  Monitor 03/15/22   Normal sinus rhythm with rare PACs and rare PVCs.   Rare, brief runs of PACs, not associated with symptoms.   No atrial fibrillation.   No symptoms reported.   No sustained pathologic arrhythmias.     Patch Wear Time:  14 days and 0 hours (2023-10-02T13:51:55-0400 to 2023-10-16T13:51:59-0400)   Patient had a min HR of 52 bpm,  max HR of 158 bpm, and avg HR of 74 bpm. Predominant underlying rhythm was Sinus Rhythm. 7 Supraventricular Tachycardia runs occurred, the run with the fastest interval lasting 8 beats with a max rate of 158 bpm, the  longest lasting 13 beats with an avg rate of 124 bpm. Isolated SVEs were rare (<1.0%), SVE Couplets were rare (<1.0%), and SVE Triplets were rare (<1.0%). Isolated VEs were rare (<1.0%), VE Couplets were rare (<1.0%), and no VE Triplets were present.  Ventricular Trigeminy was present.     Echo 11/24/20  1. Left ventricular ejection fraction, by estimation, is 60 to 65%. The  left ventricle has normal function. The left ventricle has no regional  wall motion abnormalities. There is mild concentric left ventricular  hypertrophy. Left ventricular diastolic   parameters are consistent with Grade I diastolic dysfunction (impaired  relaxation).   2. Right ventricular systolic function is normal. The right ventricular  size is normal. There is normal pulmonary artery systolic pressure. The  estimated right ventricular systolic pressure is 76.5 mmHg.   3. The mitral valve is normal in structure. Trivial mitral valve  regurgitation. No evidence of mitral stenosis.   4. The aortic valve is tricuspid. Aortic valve regurgitation is not  visualized. No aortic stenosis is present.   5. The inferior vena cava is normal in size with greater than 50%  respiratory variability, suggesting right atrial pressure of 3 mmHg.   Comparison(s): Compared to prior TTE in 08/2020, the LVEF has returned to  normal (60-65% from 25-30%).  L/RHC (09/24/2020): Mild to moderate, non-obstructive coronary artery disease including 20-35% distal LMCA and 50% ostial LAD disease, similar to prior catheterization when FFR was not hemodynamically significant. Patent proximal RCA stents with mild to moderate in-stent restenosis (30-40%). Normal left and right heart filling pressures. Normal pulmonary artery pressures. Normal cardiac output/index.   TTE (09/11/2020):  1. Recommend limited echo with iv contrast agent (definity) for addequate  assessment of LVEF and wall motion.. Left ventricular ejection fraction,  by estimation, is 25 to 30%. The left ventricle has severely decreased  function. Left ventricular  endocardial border not optimally defined to evaluate regional wall motion.  Left ventricular diastolic parameters are indeterminate.   2. Right ventricular systolic function is normal. The right ventricular  size is normal.   3. The mitral valve was not well visualized. Mild mitral valve  regurgitation.   4. The aortic valve was not well visualized. Aortic valve regurgitation  is not visualized.  EKG:  No EKG today.   Recent Labs: 02/13/2022: BUN 17; Creatinine, Ser  1.03; Hemoglobin 10.5; Magnesium 2.3; Platelets 256; Potassium 4.8; Sodium 145  Recent Lipid Panel    Component Value Date/Time   CHOL 107 10/02/2019 1228   CHOL 85 03/20/2013 0557   TRIG 183 (H) 09/15/2020 0552   TRIG 112 03/20/2013 0557   HDL 41 10/02/2019 1228   HDL 21 (L) 03/20/2013 0557   CHOLHDL 2.6 10/02/2019 1228   CHOLHDL 3.5 06/05/2016 0911   VLDL 24 06/05/2016 0911   VLDL 22 03/20/2013 0557   LDLCALC 43 10/02/2019 1228   LDLCALC 42 03/20/2013 0557   Home Medications   Current Meds  Medication Sig   acetaminophen (TYLENOL) 500 MG tablet Take 1,000 mg by mouth every 6 (six) hours as needed for headache.   albuterol (PROVENTIL HFA;VENTOLIN HFA) 108 (90 Base) MCG/ACT inhaler Inhale 2 puffs into the lungs every 4 (four) hours as needed for wheezing or shortness of breath.  aspirin EC 81 MG tablet Take 81 mg by mouth at bedtime.    azelastine (ASTELIN) 0.1 % nasal spray Place 2 sprays into both nostrils daily.   Buprenorphine HCl-Naloxone HCl 8-2 MG FILM Place 0.5 Film under the tongue daily.   cetirizine (ZYRTEC) 10 MG tablet Take 1 tablet (10 mg total) by mouth daily.   clopidogrel (PLAVIX) 75 MG tablet Take 1 tablet (75 mg total) by mouth daily.   ferrous sulfate 325 (65 FE) MG tablet Take 325 mg by mouth daily with breakfast.   fluticasone (FLONASE) 50 MCG/ACT nasal spray Place 1 spray into both nostrils daily.   food thickener (THICK IT) POWD Use as needed for honey thick liquids   furosemide (LASIX) 20 MG tablet Take 1 tablet (20 mg total) by mouth daily. MAY TAKE AN EXTRA 20 MG AS NEEDED FOR 2 LB WEIGHT GAIN IN 24 OR 5 LB WEIGHT GAIN IN 5 DAYS   levothyroxine (SYNTHROID) 88 MCG tablet Take 88 mcg by mouth daily.   losartan (COZAAR) 50 MG tablet Take 1 tablet (50 mg total) by mouth daily.   metoprolol succinate (TOPROL-XL) 50 MG 24 hr tablet Take 1 tablet (50 mg total) by mouth daily. Take with or immediately following a meal.   pantoprazole (PROTONIX) 40 MG tablet Take  1 tablet (40 mg total) by mouth daily. Further refills per PCP. (Patient taking differently: Take 40 mg by mouth daily.)   Potassium Chloride ER 20 MEQ TBCR Take 1 tablet by mouth daily.   rosuvastatin (CRESTOR) 20 MG tablet Take 1 tablet (20 mg total) by mouth daily.   TRELEGY ELLIPTA 100-62.5-25 MCG/INH AEPB Take 1 puff by mouth daily.   triamcinolone cream (KENALOG) 0.1 % Apply 1 application topically 2 (two) times daily. to affected area   [DISCONTINUED] empagliflozin (JARDIANCE) 10 MG TABS tablet Take 1 tablet (10 mg total) by mouth daily.    Review of Systems    All other systems reviewed and are otherwise negative except as noted above.  Physical Exam    VS:  BP 138/80   Pulse 80   Ht '4\' 11"'$  (1.499 m)   Wt 152 lb 4.8 oz (69.1 kg)   BMI 30.76 kg/m  , BMI Body mass index is 30.76 kg/m.  Wt Readings from Last 3 Encounters:  05/19/22 152 lb 4.8 oz (69.1 kg)  02/13/22 153 lb 6.4 oz (69.6 kg)  01/25/22 147 lb (66.7 kg)    GEN: Well nourished, well developed, in no acute distress. HEENT: normal. Neck: Supple, no JVD, carotid bruits, or masses. Cardiac: RRR, no murmurs, rubs, or gallops. No clubbing, cyanosis, edema.  Radials/DP/PT 2+ and equal bilaterally.  Respiratory:  Respirations regular and unlabored, clear to auscultation bilaterally. GI: Soft, nontender, nondistended. MS: No deformity or atrophy. Skin: Warm and dry, no rash. Neuro:  Strength and sensation are intact. Psych: Normal affect.  Assessment & Plan    SVT / Sinus tachycardia - Monitor with 7 brief episodes of SVT which were not triggered episodes.  No indication for further workup at this time.  Continue present dose metoprolol.  Could consider increasing dose if she has worsening palpitations in the future.  Recommend avoidance of caffeine, staying well-hydrated, managing stress well.  HTN -BP well controlled at home though elevated in clinic today. Continue current antihypertensive regimen including Losartan  '50mg'$  QD, Toprol 50QD.  She will report if BP consistently greater than 130/80 by home readings.  If noted will increase losartan to 75 mg daily.  We will call her in a week to check in.  CAD s/p DES to RCA - RCA initially with BMS with ISR requiring DES and subsequent balloon angioplasty. LHC 09/2020 mild to moderate nonobstructive disease of distal LMCA and LAD with patent prox RCA stent mild to moderate ISR (30-40%). Stable with no anginal symptoms. No indication for ischemic evaluation. GDMT includes aspirin, crestor, toprol, jardiance, plavix. In setting of recurrent ISR would recommend continuation of DAPT. Heart healthy diet and regular cardiovascular exercise encouraged.   Chronic systolic and diastolic heart failure with recovered LVEF./ Ischemic cardiomyopathy - LVEF by echo 09/11/2020 of 25-30% in the setting of NSTEMI, heart failure exacerbation, bacteremia, complicated hospitalization.  Echocardiogram 11/2020 LVEF improved to 16-10%, grade 1 diastolic dysfunction, mild LVH, no significant valvular abnormality. GDMT includes Jardiance 10 mg daily, Toprol 50 mg daily, losartan 50 mg daily, Lasix 20 mg QOD with additional tablet PRN for edema  Continue current medications.  Euvolemic on exam.  Anemia - On iron tablet per PCP. Reports no palpitations.    HLD, LDL goal <70 -Continue Crestor '20mg'$  QD.   Tobacco use -Continued cessation encouraged.  Encouraged to utilize 1-800-QUITNOW  GERD - Continue Protonix.   COPD - No signs of acute exacerbations. Follow with PCP. Continue Trelegy.   Disposition: Follow up in 4 months with Dr. Christ Kick or APP.    Signed, Loel Dubonnet, NP 05/19/2022, 5:01 PM [place]

## 2022-05-25 ENCOUNTER — Encounter (HOSPITAL_BASED_OUTPATIENT_CLINIC_OR_DEPARTMENT_OTHER): Payer: Self-pay

## 2022-06-01 ENCOUNTER — Telehealth (HOSPITAL_BASED_OUTPATIENT_CLINIC_OR_DEPARTMENT_OTHER): Payer: Self-pay

## 2022-06-01 NOTE — Telephone Encounter (Signed)
Those blood pressure readings look great! I would follow up with primary care regarding UTI. If they are still persistent despite stopping Lasix her primary care provider may want her to see a urologist.   Loel Dubonnet, NP

## 2022-06-01 NOTE — Telephone Encounter (Signed)
Returned call to patient and provided the following recommendations. Patient verbalizes understanding and states she already called her PCP today after our last conversation and is waiting to hear back from them!    "Those blood pressure readings look great! I would follow up with primary care regarding UTI. If they are still persistent despite stopping Lasix her primary care provider may want her to see a urologist.    Loel Dubonnet, NP"

## 2022-06-01 NOTE — Telephone Encounter (Addendum)
Called patient to review recent blood pressure logs,   Patient states it has been running much lower than it has been before. She states it has been in the 120s-135/ over 70s range.   Patient states she still has another UTI despite stopping Jardiance.    ----- Message from Gerald Stabs, RN sent at 05/19/2022  1:55 PM EST ----- Call for BP check in for CW

## 2022-07-03 ENCOUNTER — Encounter (HOSPITAL_BASED_OUTPATIENT_CLINIC_OR_DEPARTMENT_OTHER): Payer: Self-pay | Admitting: Family

## 2022-07-03 ENCOUNTER — Ambulatory Visit (HOSPITAL_BASED_OUTPATIENT_CLINIC_OR_DEPARTMENT_OTHER): Payer: Medicare HMO | Admitting: Family

## 2022-07-03 VITALS — BP 126/68 | HR 82 | Ht 59.0 in | Wt 151.0 lb

## 2022-07-03 DIAGNOSIS — I471 Supraventricular tachycardia, unspecified: Secondary | ICD-10-CM

## 2022-07-03 DIAGNOSIS — I25118 Atherosclerotic heart disease of native coronary artery with other forms of angina pectoris: Secondary | ICD-10-CM

## 2022-07-03 DIAGNOSIS — E785 Hyperlipidemia, unspecified: Secondary | ICD-10-CM

## 2022-07-03 DIAGNOSIS — I5022 Chronic systolic (congestive) heart failure: Secondary | ICD-10-CM

## 2022-07-03 DIAGNOSIS — I10 Essential (primary) hypertension: Secondary | ICD-10-CM

## 2022-07-03 DIAGNOSIS — Z720 Tobacco use: Secondary | ICD-10-CM

## 2022-07-03 DIAGNOSIS — J449 Chronic obstructive pulmonary disease, unspecified: Secondary | ICD-10-CM

## 2022-07-03 DIAGNOSIS — E876 Hypokalemia: Secondary | ICD-10-CM

## 2022-07-03 DIAGNOSIS — K219 Gastro-esophageal reflux disease without esophagitis: Secondary | ICD-10-CM

## 2022-07-03 MED ORDER — FUROSEMIDE 20 MG PO TABS: 20.0000 mg | ORAL_TABLET | Freq: Every day | ORAL | 3 refills | Status: AC

## 2022-07-03 NOTE — Patient Instructions (Addendum)
Medication Instructions:  Your physician has recommended you make the following change in your medication:    REDUCE Furosemide (Lasix) to 88m daily Take an additional tablet as needed for shortness of breath, leg swelling, or weight gain (2 lbs overnight or 5 lbs in one week)  Recommend talking with your primary care provider about medications for neuropathy. Some people have good relief from a medication called Lyrica.   *If you need a refill on your cardiac medications before your next appointment, please call your pharmacy*   Lab Work: Your physician recommends that you return for lab work today: BMP, CBC, magnesium  This will let u[place]orearecheck your kidney numbers and electrolytes.   If you have labs (blood work) drawn today and your tests are completely normal, you will receive your results only by: [place](if you have [place]Chart) OR A paper copy in the mail If you have any lab test that is abnormal or we need to change your treatment, we will call you to review the results.   Testing/Procedures: None ordered today.   Follow-Up: [facility] and your [facility] needs are our priority.  As part of our continuing mission to provide you with exceptional heart care, we have created designated Provider Care Teams.  These Care Teams include your primary Cardiologist (physician) and Advanced Practice Providers (APPs -  Physician Assistants and Nurse Practitioners) who all work together to provide you with the care you need, when you need it.  We recommend signing up for the patient portal called "MyChart".  Sign up information is provided on this After Visit Summary.  MyChart is used to connect with patients for Virtual Visits (Telemedicine).  Patients are able to view lab/test results, encounter notes, upcoming appointments, etc.  Non-urgent messages can be sent to your provider as well.   To learn more about what you can do with MyChart, go to [email or web address]     Your next appointment:   As scheduled with Dr. VIrish Lack Other Instructions  Heart Healthy Diet Recommendations: A low-salt diet is recommended. Meats should be grilled, baked, or boiled. Avoid fried foods. Focus on lean protein sources like fish or chicken with vegetables and fruits. The American Heart Association is a GMicrobiologist  American Heart Association Diet and Lifeystyle Recommendations   Exercise recommendations: The American Heart Association recommends 150 minutes of moderate intensity exercise weekly. Try 30 minutes of moderate intensity exercise 4-5 times per week. This could include walking, jogging, or swimming.  Recommend weighing daily and keeping a log. Please call our office if you have weight gain of 2 pounds overnight or 5 pounds in 1 week.   Date  Time Weight

## 2022-07-03 NOTE — Progress Notes (Signed)
Office Visit    Patient Name: Kathryn Garrett Date of Encounter: 07/03/2022  PCP:  Kathryn Bleacher, NP   [place]  Cardiologist:  Kathryn Grooms, MD  Advanced Practice Provider:  No care team member to display Electrophysiologist:  None  Chief Complaint    Kathryn Garrett is a 77 y.o. female presents today for hospital follow-up  Past Medical History    Past Medical History:  Diagnosis Date   Anemia, mild    Arthritis    "qwhere" (02/10/2016)   CAD (coronary artery disease)    a. VF arrest 01/2009/CAD with inferoposterior MI s/p aspiration thrombectomy/BMS of RCA at that time. b. ISR of BMS s/p DES to RCA 06/2010 with moderate LM/LAD disease not significant by FFR. c. s/p angiosculpt PTCA to prox RCA 01/2016 for ISR. d. Aggressive PTCA to prox RCA for ISR 05/2016.   Cardiac arrest ([place]) 01/2009   a. in setting of inf-post STEMI 01/2009 (VF).   Chest pain 06/04/2016   Chronic bronchitis (HCC)    Chronic combined systolic and diastolic CHF (congestive heart failure) ([place])    a. EF 40-45% by cath 2010. b. 60-65% with grade 1 DD by echo 09/2015   COPD (chronic obstructive pulmonary disease) ([place])    Hyperlipidemia 01/28/2016   Hyperlipidemia LDL goal <70 01/28/2016   Hypertension    Lung mass    MI (myocardial infarction) ([place]) 01/2009   Narcotic abuse ([place])    pt now taking Suboxone tid   Pleural effusion on left 09/24/2015   Pneumonia 06/2014; 09/2015   Pre-diabetes    Pulmonary nodule    PVD (peripheral vascular disease) (HCC)    mild atherosclerosis of infrarenal aorta, 25% ostial left renal artery stenosis, 50% ostial right common iliac by cath 2010   S/P PTCA (percutaneous transluminal coronary angioplasty) 02/10/16 to RCA lesion for in stent restenois 02/11/2016   Subclavian artery stenosis (HCC)    a. >50% by duplex 05/2016.   Tobacco abuse    Unstable angina [facility])    Past Surgical History:  Procedure Laterality Date   ABDOMINAL  HYSTERECTOMY     ANKLE FRACTURE SURGERY Right 2000s   CARDIAC CATHETERIZATION N/A 02/10/2016   Procedure: Left Heart Cath and Coronary Angiography;  Surgeon: Kathryn Booze, MD;  Location: [facility];  Service: Cardiovascular;  Laterality: N/A;   CARDIAC CATHETERIZATION N/A 02/10/2016   Procedure: Coronary Balloon Angioplasty;  Surgeon: Kathryn Booze, MD;  Location: [facility];  Service: Cardiovascular;  Laterality: N/A;  instent RCA   CARDIAC CATHETERIZATION N/A 06/05/2016   Procedure: Left Heart Cath and Coronary Angiography;  Surgeon: Kathryn Man, MD;  Location: [place] [facility];  Service: Cardiovascular;  Laterality: N/A;   CARDIAC CATHETERIZATION N/A 06/05/2016   Procedure: Coronary Balloon Angioplasty;  Surgeon: Kathryn Man, MD;  Location: [facility];  Service: Cardiovascular;  Laterality: N/A;   CHEST TUBE INSERTION N/A 10/08/2015   Procedure: PLEURX CATH REMOVAL;  Surgeon: Kathryn Lewandowsky, MD;  Location: [facility];  Service: Thoracic;  Laterality: N/A;   CORONARY ANGIOPLASTY WITH STENT PLACEMENT  01/2009; 2012   FRACTURE SURGERY     RIGHT/LEFT HEART CATH AND CORONARY ANGIOGRAPHY N/A 09/24/2020   Procedure: RIGHT/LEFT HEART CATH AND CORONARY ANGIOGRAPHY;  Surgeon: Kathryn Bush, MD;  Location: [facility];  Service: Cardiovascular;  Laterality: N/A;   VIDEO ASSISTED THORACOSCOPY (VATS)/THOROCOTOMY Left 09/29/2015   Procedure: PREOP BRONCHOSCOPY, LEFT THORACOSCOPY, POSSIBLE  THORACOTOMY, PLEURAL BIOPSY, TALC;  Surgeon: Kathryn Lewandowsky, MD;  Location: [facility];  Service: General;  Laterality: Left;    Allergies  Allergies  Allergen Reactions   Azithromycin Rash    History of Present Illness    Kathryn Garrett is a 78 y.o. female with a hx of CAD s/p DES to RCA, hypertension, hyperlipidemia, COPD, HFrEF/ICM last seen 05/19/2022  Her CAD dates back to inferior MI treated with bare metal stent to RCA in 2010. In 06/2010 she had DES to proximal  to mid RCA for ISR of the BMS. Her LM and LAD disease was moderate and not significant. Admitted 01/2016 for unstable angina with cardiac cath showing ISR of RCA for which she underwent balloon angioplasty with ISR 75% ? 0% and Plavix was resumed. She had unstable angina again 05/2016 with cardiac cath showing ISR within proximal RCA treated with PTCA with scoring balloon. She had bruit on exam with subsequent carotid doppler showing bilateral ECA stenosis >50% but no significant ICA stenosis.   She had COVID 01/2020 requiring oxygen therapy. Started on Levothyroxine 05/2020 due to hypothyroidism. Hospitalized 09/10/20 with acute hypoxic respiratory failure secondary to COPD exacerbation complicated by flash pulmonary edema and NSTEMI. Echo 09/11/20 LVEF 25-30%, indeterminite diastolic parameters, mild MR. [facility] 09/24/20 showing mild to moderate nonobstructive CAD including  20-35% distal LMCA and 50% ostial LAD disease with FFR not hemodynamicaly significant, prox RCA stent with mild-moderat ICR (30-40%), normal pulmonary pressure, cardiac output/index, filling pressures. Hospital course complicated by gram negative bacteremia, evaluated by ID and treated with meropenem. Had SVT while admitted and was discharged on Toprol. Repeat echo 11/21/2020 LVEF 60 to 65%, no R WMA, mild LVH, grade 1 diastolic dysfunction, RV normal size and function, normal PASP, trivial MR.    Recurrent COVID 03/10/21 treated with Paxlovid which did not require admission.   Seen 06/07/21. Crestor was resumed at lower dose of 89m QD due to reported myalgias.   Admitted 09/2021 for community acquired pneumonia and COPD exacerbation. ED visit 12/13/21 cellulitis after cutting herself with razor accidentally on her leg.   Admitted 9/5-01/19/22 with acute heart failure, diuresed with IV lasix. Noted home noncompliance with Lasix.  Discharged on lasix 474mQD, Losartan 5082mD, Metoprolol succiante 88m44mLasix later reduced to 20 mg every other day  due to elevated creatinine  Seen 02/2022 noting palpitations.  Subsequent ZIO monitor predominantly normal sinus rhythm with 7 short runs of SVT which were not triggered episodes.  Last seen 05/19/2022 with BP elevated in clinic but reported well-controlled at home and current regimen continued.   Admitted 2/12 - 06/28/2022 to [facility] presenting with shortness of breath.Initial troponin 229 and BNP 354.  EKG no acute changes.  She was admitted for CHF.  She was discharged on Lasix 40 mg.  Presents today for follow up. Notes she did not take her Lasix for a  few weeks to a month prior to admission as she did not feel she needs it. Now verbalizes understanding of the importance. She has had no recurrent dyspnea nor chest pain. Still struggles with back pain and neuropathy related pain. Has not taken Potassium tablet since discharge.   EKGs/Labs/Other Studies Reviewed:   The following studies were reviewed today:  Monitor 03/15/22   Normal sinus rhythm with rare PACs and rare PVCs.   Rare, brief runs of PACs, not associated with symptoms.   No atrial fibrillation.   No symptoms reported.   No sustained pathologic  arrhythmias.     Patch Wear Time:  14 days and 0 hours (2023-10-02T13:51:55-0400 to 2023-10-16T13:51:59-0400)   Patient had a min HR of 52 bpm, max HR of 158 bpm, and avg HR of 74 bpm. Predominant underlying rhythm was Sinus Rhythm. 7 Supraventricular Tachycardia runs occurred, the run with the fastest interval lasting 8 beats with a max rate of 158 bpm, the  longest lasting 13 beats with an avg rate of 124 bpm. Isolated SVEs were rare (<1.0%), SVE Couplets were rare (<1.0%), and SVE Triplets were rare (<1.0%). Isolated VEs were rare (<1.0%), VE Couplets were rare (<1.0%), and no VE Triplets were present.  Ventricular Trigeminy was present.     Echo 11/24/20  1. Left ventricular ejection fraction, by estimation, is 60 to 65%. The  left ventricle has normal  function. The left ventricle has no regional  wall motion abnormalities. There is mild concentric left ventricular  hypertrophy. Left ventricular diastolic  parameters are consistent with Grade I diastolic dysfunction (impaired  relaxation).   2. Right ventricular systolic function is normal. The right ventricular  size is normal. There is normal pulmonary artery systolic pressure. The  estimated right ventricular systolic pressure is 99991111 mmHg.   3. The mitral valve is normal in structure. Trivial mitral valve  regurgitation. No evidence of mitral stenosis.   4. The aortic valve is tricuspid. Aortic valve regurgitation is not  visualized. No aortic stenosis is present.   5. The inferior vena cava is normal in size with greater than 50%  respiratory variability, suggesting right atrial pressure of 3 mmHg.   Comparison(s): Compared to prior TTE in 08/2020, the LVEF has returned to  normal (60-65% from 25-30%).  L/RHC (09/24/2020): Mild to moderate, non-obstructive coronary artery disease including 20-35% distal LMCA and 50% ostial LAD disease, similar to prior catheterization when FFR was not hemodynamically significant. Patent proximal RCA stents with mild to moderate in-stent restenosis (30-40%). Normal left and right heart filling pressures. Normal pulmonary artery pressures. Normal cardiac output/index.   TTE (09/11/2020):  1. Recommend limited echo with iv contrast agent (definity) for addequate  assessment of LVEF and wall motion.. Left ventricular ejection fraction,  by estimation, is 25 to 30%. The left ventricle has severely decreased  function. Left ventricular  endocardial border not optimally defined to evaluate regional wall motion.  Left ventricular diastolic parameters are indeterminate.   2. Right ventricular systolic function is normal. The right ventricular  size is normal.   3. The mitral valve was not well visualized. Mild mitral valve  regurgitation.   4. The  aortic valve was not well visualized. Aortic valve regurgitation  is not visualized.  EKG:  No EKG today.   Recent Labs: 02/13/2022: BUN 17; Creatinine, Ser 1.03; Hemoglobin 10.5; Magnesium 2.3; Platelets 256; Potassium 4.8; Sodium 145  Recent Lipid Panel    Component Value Date/Time   CHOL 107 10/02/2019 1228   CHOL 85 03/20/2013 0557   TRIG 183 (H) 09/15/2020 0552   TRIG 112 03/20/2013 0557   HDL 41 10/02/2019 1228   HDL 21 (L) 03/20/2013 0557   CHOLHDL 2.6 10/02/2019 1228   CHOLHDL 3.5 06/05/2016 0911   VLDL 24 06/05/2016 0911   VLDL 22 03/20/2013 0557   LDLCALC 43 10/02/2019 1228   LDLCALC 42 03/20/2013 0557   Home Medications   Current Meds  Medication Sig   acetaminophen (TYLENOL) 500 MG tablet Take 1,000 mg by mouth every 6 (six) hours as needed for headache.   albuterol (  PROVENTIL HFA;VENTOLIN HFA) 108 (90 Base) MCG/ACT inhaler Inhale 2 puffs into the lungs every 4 (four) hours as needed for wheezing or shortness of breath.   aspirin EC 81 MG tablet Take 81 mg by mouth at bedtime.    azelastine (ASTELIN) 0.1 % nasal spray Place 2 sprays into both nostrils daily.   Buprenorphine HCl-Naloxone HCl 8-2 MG FILM Place 0.5 Film under the tongue daily.   cetirizine (ZYRTEC) 10 MG tablet Take 1 tablet (10 mg total) by mouth daily.   clopidogrel (PLAVIX) 75 MG tablet Take 1 tablet (75 mg total) by mouth daily.   ferrous sulfate 325 (65 FE) MG tablet Take 325 mg by mouth daily with breakfast.   food thickener (THICK IT) POWD Use as needed for honey thick liquids   levothyroxine (SYNTHROID) 88 MCG tablet Take 88 mcg by mouth daily.   losartan (COZAAR) 50 MG tablet Take 1 tablet (50 mg total) by mouth daily.   metoprolol succinate (TOPROL-XL) 50 MG 24 hr tablet Take 1 tablet (50 mg total) by mouth daily. Take with or immediately following a meal.   pantoprazole (PROTONIX) 40 MG tablet Take 1 tablet (40 mg total) by mouth daily. Further refills per PCP. (Patient taking differently:  Take 40 mg by mouth daily.)   Potassium Chloride ER 20 MEQ TBCR Take 1 tablet by mouth daily.   rosuvastatin (CRESTOR) 20 MG tablet Take 1 tablet (20 mg total) by mouth daily.   TRELEGY ELLIPTA 100-62.5-25 MCG/INH AEPB Take 1 puff by mouth daily.   triamcinolone cream (KENALOG) 0.1 % Apply 1 application topically 2 (two) times daily. to affected area   [DISCONTINUED] furosemide (LASIX) 20 MG tablet Take 1 tablet (20 mg total) by mouth daily. MAY TAKE AN EXTRA 20 MG AS NEEDED FOR 2 LB WEIGHT GAIN IN 24 OR 5 LB WEIGHT GAIN IN 5 DAYS    Review of Systems    All other systems reviewed and are otherwise negative except as noted above.  Physical Exam    VS:  BP 126/68   Pulse 82   Ht 4' 11"$  (1.499 m)   Wt 151 lb (68.5 kg)   SpO2 97%   BMI 30.50 kg/m  , BMI Body mass index is 30.5 kg/m.  Wt Readings from Last 3 Encounters:  07/03/22 151 lb (68.5 kg)  05/19/22 152 lb 4.8 oz (69.1 kg)  02/13/22 153 lb 6.4 oz (69.6 kg)    GEN: Well nourished, well developed, in no acute distress. HEENT: normal. Neck: Supple, no JVD, carotid bruits, or masses. Cardiac: RRR, no murmurs, rubs, or gallops. No clubbing, cyanosis, edema.  Radials/DP/PT 2+ and equal bilaterally.  Respiratory:  Respirations regular and unlabored, clear to auscultation bilaterally. GI: Soft, nontender, nondistended. MS: No deformity or atrophy. Skin: Warm and dry, no rash. Neuro:  Strength and sensation are intact. Psych: Normal affect.  Assessment & Plan    SVT / Sinus tachycardia - Monitor 03/2022 with 7 brief episodes of SVT which were not triggered episodes.  No indication for further workup at this time.  Continue present dose metoprolol.  Could consider increasing dose if she has worsening palpitations in the future.  Recommend avoidance of caffeine, staying well-hydrated, managing stress well.  HTN - BP well controlled. Continue current antihypertensive regimen.    CAD s/p DES to RCA - RCA initially with BMS with ISR  requiring DES and subsequent balloon angioplasty. LHC 09/2020 mild to moderate nonobstructive disease of distal LMCA and LAD with patent prox  RCA stent mild to moderate ISR (30-40%). Stable with no anginal symptoms. No indication for ischemic evaluation. GDMT includes aspirin, crestor, toprol, jardiance, plavix. In setting of recurrent ISR would recommend continuation of DAPT. Heart healthy diet and regular cardiovascular exercise encouraged.   Chronic systolic and diastolic heart failure with recovered LVEF./ Ischemic cardiomyopathy - LVEF by echo 09/11/2020 of 25-30% in the setting of NSTEMI, heart failure exacerbation, bacteremia, complicated hospitalization.  Echocardiogram 11/2020 LVEF improved to 99991111, grade 1 diastolic dysfunction, mild LVH, no significant valvular abnormality. Recent admission with volume overload in setting of noncompliance. GDMT includes Toprol 50 mg daily, losartan 50 mg daily, Lasix 40 mg QOD. Reduce lasix to 25m QD with additional 215mPRN for weight gain, edema, dyspnea as euvolemic on exam. Jardiance previously discontinued due to UTI. However due to persistent UTI, has been referred to uro-gynecologist.BMP, magnesium, CBC today. Readdress potassium dosing pending results as she has not taken since hospital discharge.  Anemia - On iron tablet per PCP. Reports no palpitations.    HLD, LDL goal <70 -Continue Crestor 2031mD.   Tobacco use -Continued cessation encouraged.  Encouraged to utilize 1-800-QUITNOW.   GERD - Continue Protonix.   COPD - No signs of acute exacerbation. Follow with PCP. Continue Trelegy.   Disposition: Follow up as scheduled with Dr. VarChrist Kick APP.    Signed, CaiLoel DubonnetP 07/03/2022, 4:40 PM [facility]

## 2022-07-04 ENCOUNTER — Telehealth (HOSPITAL_BASED_OUTPATIENT_CLINIC_OR_DEPARTMENT_OTHER): Payer: Self-pay

## 2022-07-04 LAB — CBC
Hematocrit: 33.1 % — ABNORMAL LOW (ref 34.0–46.6)
Hemoglobin: 10.7 g/dL — ABNORMAL LOW (ref 11.1–15.9)
MCH: 29.3 pg (ref 26.6–33.0)
MCHC: 32.3 g/dL (ref 31.5–35.7)
MCV: 91 fL (ref 79–97)
Platelets: 337 10*3/uL (ref 150–450)
RBC: 3.65 x10E6/uL — ABNORMAL LOW (ref 3.77–5.28)
RDW: 13.9 % (ref 11.7–15.4)
WBC: 10.9 10*3/uL — ABNORMAL HIGH (ref 3.4–10.8)

## 2022-07-04 LAB — BASIC METABOLIC PANEL
BUN/Creatinine Ratio: 33 — ABNORMAL HIGH (ref 12–28)
BUN: 37 mg/dL — ABNORMAL HIGH (ref 8–27)
CO2: 21 mmol/L (ref 20–29)
Calcium: 9.3 mg/dL (ref 8.7–10.3)
Chloride: 104 mmol/L (ref 96–106)
Creatinine, Ser: 1.11 mg/dL — ABNORMAL HIGH (ref 0.57–1.00)
Glucose: 110 mg/dL — ABNORMAL HIGH (ref 70–99)
Potassium: 4.4 mmol/L (ref 3.5–5.2)
Sodium: 142 mmol/L (ref 134–144)
eGFR: 51 mL/min/{1.73_m2} — ABNORMAL LOW (ref >59)

## 2022-07-04 LAB — MAGNESIUM: Magnesium: 2.3 mg/dL (ref 1.6–2.3)

## 2022-07-04 NOTE — Telephone Encounter (Addendum)
Results called to patient who verbalizes understanding!      ----- Message from Loel Dubonnet, NP sent at 07/04/2022  7:32 AM EST ----- Kidney function slightly decreased from previous.  Likely related to recently increased Lasix while admitted.  At clinic visit we reduced Lasix dose to 20 mg daily so should see improvement.  Potassium level is normal.  She may remain off potassium supplement at this time.  CBC shows stable mild anemia.  Slight escalation in white blood cell count-please be sure to report any signs of infection such as dysuria, cough, fever.

## 2022-07-07 LAB — COMPREHENSIVE METABOLIC PANEL

## 2022-09-22 LAB — LAB REPORT - SCANNED
A1c: 5.7
EGFR: 79

## 2022-09-24 NOTE — Progress Notes (Unsigned)
Cardiology Office Note   Date:  09/25/2022   ID:  Kathryn Garrett, DOB 1945/12/27, MRN [id number]  PCP:  Erskine Emery, NP    No chief complaint on file.  CAD  Wt Readings from Last 3 Encounters:  09/25/22 140 lb (63.5 kg)  07/03/22 151 lb (68.5 kg)  05/19/22 152 lb 4.8 oz (69.1 kg)       History of Present Illness: Kathryn Garrett is a 77 y.o. female    with history of HTN, HL, COPD with ongoing tobacco use, CAD status post inferior MI treated with bare-metal stent to the RCA in 2010, status post DES to the proximal to mid RCA 06/2010 for in stent restenosis of the bare-metal stent. She had moderate left main/LAD disease which was not significant.   She was admitted in 9/17 for [place]. Cath with ISR of RCA for which she  underwent balloon angioplasty of RCA with ISR 75%->0%. Plavix was restarted.    She presented [facility] to [facility] on 06[facility]04[facility]16 with complaint of recurrent CP c/w unstable angina. Her CP was relieved with NTG. ECG with mild ST depression in inferior leads. 1st troponin was normal. Given her history and symptomatology, she was referred for LHC. She was found to have recurrent in-stent restenosis within the proximal RCA, treated with PTCA with scoring balloon. Repeat FFR on ostial LAD confirms this was not significant. She was continued on DAPT with ASA + Plavix and metoprolol. Smoking cessation was advised.    Of note, she was also found to have a right carotid bruit on exam. Subsequent doppler study showed bilateral ECA stenosis >50% but no significant ICA.   She had COVID in September 2021. She was on oxygen for a while.  Had prolonged fatigue after COVID.  Ultimately was started on Synthroid for hypothyroidism.   Had oiprthopnea.  Hospitalized for congestive heart failure and February 2024 [facility].  She had stopped her Lasix.    "The patient has evidence of acute heart failure with shortness of breath and clinical finding of an  elevated BNP, radiographic imaging consistent with same, and pitting edema. Will diurese patient, monitor vital signs, weight and intake and output closely. TTE last admission 01/2022 LVEF recovered with EF of 60% and grade 1 diastolic dysfunction continue IV furosemide 40 mg q 12 hours resume PTA losartan and metoprolol once med rec completed cardiology consult was contacted in ED, suspected demand ischemia patient follows with Dr. Eldridge Dace at [facility] " She was sent home on Lasix 40 mg daily but has already started to decrease the frequency.   Denies : Chest pain. Dizziness. Leg edema. Nitroglycerin use. Orthopnea. Palpitations. Paroxysmal nocturnal dyspnea. Shortness of breath. Syncope.    Frequent UTIs.   Past Medical History:  Diagnosis Date   Anemia, mild    Arthritis    "qwhere" (02/10/2016)   CAD (coronary artery disease)    a. VF arrest 01/2009/CAD with inferoposterior MI s/p aspiration thrombectomy/BMS of RCA at that time. b. ISR of BMS s/p DES to RCA 06/2010 with moderate LM/LAD disease not significant by FFR. c. s/p angiosculpt PTCA to prox RCA 01/2016 for ISR. d. Aggressive PTCA to prox RCA for ISR 05/2016.   Cardiac arrest (HCC) 01/2009   a. in setting of inf-post STEMI 01/2009 (VF).   Chest pain 06/04/2016   Chronic bronchitis (HCC)    Chronic combined systolic and diastolic CHF (congestive heart failure) (HCC)    a. EF 40-45%  by cath 2010. b. 60-65% with grade 1 DD by echo 09/2015   COPD (chronic obstructive pulmonary disease) (HCC)    Hyperlipidemia 01/28/2016   Hyperlipidemia LDL goal <70 01/28/2016   Hypertension    Lung mass    MI (myocardial infarction) (HCC) 01/2009   Narcotic abuse (HCC)    pt now taking Suboxone tid   Pleural effusion on left 09/24/2015   Pneumonia 06/2014; 09/2015   Pre-diabetes    Pulmonary nodule    PVD (peripheral vascular disease) (HCC)    mild atherosclerosis of infrarenal aorta, 25% ostial left renal artery stenosis, 50% ostial right common  iliac by cath 2010   S/P PTCA (percutaneous transluminal coronary angioplasty) 02/10/16 to RCA lesion for in stent restenois 02/11/2016   Subclavian artery stenosis (HCC)    a. >50% by duplex 05/2016.   Tobacco abuse    Unstable angina [facility])     Past Surgical History:  Procedure Laterality Date   ABDOMINAL HYSTERECTOMY     ANKLE FRACTURE SURGERY Right 2000s   CARDIAC CATHETERIZATION N/A 02/10/2016   Procedure: Left Heart Cath and Coronary Angiography;  Surgeon: Corky Crafts, MD;  Location: [facility] INVASIVE CV LAB;  Service: Cardiovascular;  Laterality: N/A;   CARDIAC CATHETERIZATION N/A 02/10/2016   Procedure: Coronary Balloon Angioplasty;  Surgeon: Corky Crafts, MD;  Location: [facility] INVASIVE CV LAB;  Service: Cardiovascular;  Laterality: N/A;  instent RCA   CARDIAC CATHETERIZATION N/A 06/05/2016   Procedure: Left Heart Cath and Coronary Angiography;  Surgeon: Marykay Lex, MD;  Location: [facility] INVASIVE CV LAB;  Service: Cardiovascular;  Laterality: N/A;   CARDIAC CATHETERIZATION N/A 06/05/2016   Procedure: Coronary Balloon Angioplasty;  Surgeon: Marykay Lex, MD;  Location: [facility] INVASIVE CV LAB;  Service: Cardiovascular;  Laterality: N/A;   CHEST TUBE INSERTION N/A 10/08/2015   Procedure: PLEURX CATH REMOVAL;  Surgeon: Hulda Marin, MD;  Location: [facility] ORS;  Service: Thoracic;  Laterality: N/A;   CORONARY ANGIOPLASTY WITH STENT PLACEMENT  01/2009; 2012   FRACTURE SURGERY     RIGHT/LEFT HEART CATH AND CORONARY ANGIOGRAPHY N/A 09/24/2020   Procedure: RIGHT/LEFT HEART CATH AND CORONARY ANGIOGRAPHY;  Surgeon: Yvonne Kendall, MD;  Location: [facility] INVASIVE CV LAB;  Service: Cardiovascular;  Laterality: N/A;   VIDEO ASSISTED THORACOSCOPY (VATS)/THOROCOTOMY Left 09/29/2015   Procedure: PREOP BRONCHOSCOPY, LEFT THORACOSCOPY, POSSIBLE THORACOTOMY, PLEURAL BIOPSY, TALC;  Surgeon: Hulda Marin, MD;  Location: [facility] ORS;  Service: General;  Laterality: Left;     Current Outpatient Medications   Medication Sig Dispense Refill   acetaminophen (TYLENOL) 500 MG tablet Take 1,000 mg by mouth every 6 (six) hours as needed for headache.     albuterol (PROVENTIL HFA;VENTOLIN HFA) 108 (90 Base) MCG/ACT inhaler Inhale 2 puffs into the lungs every 4 (four) hours as needed for wheezing or shortness of breath.     aspirin EC 81 MG tablet Take 81 mg by mouth at bedtime.      azelastine (ASTELIN) 0.1 % nasal spray Place 2 sprays into both nostrils daily.  0   Buprenorphine HCl-Naloxone HCl 8-2 MG FILM Place 0.5 Film under the tongue daily.     cetirizine (ZYRTEC) 10 MG tablet Take 1 tablet (10 mg total) by mouth daily. 30 tablet 0   clopidogrel (PLAVIX) 75 MG tablet Take 1 tablet (75 mg total) by mouth daily. 90 tablet 3   ferrous sulfate 325 (65 FE) MG tablet Take 325 mg by mouth daily with breakfast.     food thickener (THICK  IT) POWD Use as needed for honey thick liquids 1020 g 0   furosemide (LASIX) 20 MG tablet Take 1 tablet (20 mg total) by mouth daily. MAY TAKE AN EXTRA 20 MG AS NEEDED FOR 2 LB WEIGHT GAIN IN 24 OR 5 LB WEIGHT GAIN IN 5 DAYS (Patient taking differently: Take 20 mg by mouth daily. MAY TAKE AN EXTRA 20 MG AS NEEDED FOR 2 LB WEIGHT GAIN IN 24 OR 5 LB WEIGHT GAIN IN 5 DAYS Taking PRN) 90 tablet 3   levothyroxine (SYNTHROID) 88 MCG tablet Take 88 mcg by mouth daily.     losartan (COZAAR) 50 MG tablet Take 1 tablet (50 mg total) by mouth daily. 90 tablet 3   meloxicam (MOBIC) 7.5 MG tablet Take 7.5 mg by mouth daily.     metoprolol succinate (TOPROL-XL) 50 MG 24 hr tablet Take 1 tablet (50 mg total) by mouth daily. Take with or immediately following a meal. 90 tablet 3   nystatin cream (MYCOSTATIN) Apply topically 2 (two) times daily.     ondansetron (ZOFRAN) 4 MG tablet Take 4 mg by mouth every 6 (six) hours.     OZEMPIC, 2 MG/DOSE, 8 MG/3ML SOPN Inject into the skin.     pantoprazole (PROTONIX) 40 MG tablet Take 1 tablet (40 mg total) by mouth daily. Further refills per PCP.  (Patient taking differently: Take 40 mg by mouth daily.) 30 tablet 2   rosuvastatin (CRESTOR) 20 MG tablet Take 1 tablet (20 mg total) by mouth daily. 90 tablet 3   TRELEGY ELLIPTA 100-62.5-25 MCG/INH AEPB Take 1 puff by mouth daily.     triamcinolone cream (KENALOG) 0.1 % Apply 1 application topically 2 (two) times daily. to affected area     nitroGLYCERIN (NITROSTAT) 0.4 MG SL tablet Place 1 tablet (0.4 mg total) under the tongue every 5 (five) minutes as needed for chest pain. 25 tablet 3   No current facility-administered medications for this visit.    Allergies:   Azithromycin    Social History:  The patient  reports that she quit smoking about 2 years ago. Her smoking use included cigarettes. She has a 28.00 pack-year smoking history. She has never used smokeless tobacco. She reports that she does not drink alcohol and does not use drugs.   Family History:  The patient's family history includes Cancer in her brother; Coronary artery disease in her father and mother; Early death in her father; Heart attack in her father and mother; Hyperlipidemia in her father and mother.    ROS:  Please see the history of present illness.   Otherwise, review of systems are positive for chronic back pain.   All other systems are reviewed and negative.    PHYSICAL EXAM: VS:  BP 122/62   Pulse 88   Ht 4\' 11"  (1.499 m)   Wt 140 lb (63.5 kg)   SpO2 98%   BMI 28.28 kg/m  , BMI Body mass index is 28.28 kg/m. GEN: Well nourished, well developed, in no acute distress HEENT: normal Neck: no JVD, carotid bruits, or masses Cardiac: RRR; no murmurs, rubs, or gallops,no edema  Respiratory:  clear to auscultation bilaterally, normal work of breathing GI: soft, nontender, nondistended, + BS MS: no deformity or atrophy Skin: warm and dry, no rash Neuro:  Strength and sensation are intact Psych: euthymic mood, full affect    Recent Labs: 07/03/2022: BUN 37; Creatinine, Ser 1.11; Hemoglobin 10.7;  Magnesium 2.3; Platelets 337; Potassium 4.4; Sodium 142  Lipid Panel    Component Value Date/Time   CHOL 107 10/02/2019 1228   CHOL 85 03/20/2013 0557   TRIG 183 (H) 09/15/2020 0552   TRIG 112 03/20/2013 0557   HDL 41 10/02/2019 1228   HDL 21 (L) 03/20/2013 0557   CHOLHDL 2.6 10/02/2019 1228   CHOLHDL 3.5 06/05/2016 0911   VLDL 24 06/05/2016 0911   VLDL 22 03/20/2013 0557   LDLCALC 43 10/02/2019 1228   LDLCALC 42 03/20/2013 0557     Other studies Reviewed: Additional studies/ records that were reviewed today with results demonstrating: 5/24 LDL 46. Normal LV function in 2022. 9/23 echo showed normal LVEF, EF 60%.    ASSESSMENT AND PLAN:  CAD/old MI: Continue clopidogrel and aspirin.  No bleeding issues.  Chronic diastolic heart failure: Appears euvolemic.  Daily weights, minimize salt.  Take Lasix at least 5x/week.  Watch for orthopnea or weight gain.  Tobacco abuse/COPD: Needs to stop vaping.  Stopped cigarettes, but recently started vaping. Hyperlipidemia: Whole food, plant-based diet.  Avoid added sugar.  Avoid processed foods. Prediabetes: A1c up to 6.4 in the past.  Dietary recommendations as noted above.  Exercise target as noted below. Hypothyroid: Now on Synthroid.   Current medicines are reviewed at length with the patient today.  The patient concerns regarding her medicines were addressed.  The following changes have been made:  No change  Labs/ tests ordered today include:  No orders of the defined types were placed in this encounter.   Recommend 150 minutes/week of aerobic exercise Low fat, low carb, high fiber diet recommended  Disposition:   FU in 6 months in [place], Lance Muss, MD  09/25/2022 4:45 PM    [facility] [address] [place], [place], [place]  [address] Phone: [phone]; Fax: [phone]

## 2022-09-25 ENCOUNTER — Encounter: Payer: Self-pay | Admitting: Interventional Cardiology

## 2022-09-25 ENCOUNTER — Ambulatory Visit: Payer: Medicare HMO | Attending: Interventional Cardiology | Admitting: Interventional Cardiology

## 2022-09-25 VITALS — BP 122/62 | HR 88 | Ht 59.0 in | Wt 140.0 lb

## 2022-09-25 DIAGNOSIS — I5032 Chronic diastolic (congestive) heart failure: Secondary | ICD-10-CM | POA: Diagnosis not present

## 2022-09-25 DIAGNOSIS — I5022 Chronic systolic (congestive) heart failure: Secondary | ICD-10-CM

## 2022-09-25 DIAGNOSIS — I25118 Atherosclerotic heart disease of native coronary artery with other forms of angina pectoris: Secondary | ICD-10-CM

## 2022-09-25 DIAGNOSIS — I471 Supraventricular tachycardia, unspecified: Secondary | ICD-10-CM

## 2022-09-25 DIAGNOSIS — I10 Essential (primary) hypertension: Secondary | ICD-10-CM

## 2022-09-25 DIAGNOSIS — Z720 Tobacco use: Secondary | ICD-10-CM

## 2022-09-25 DIAGNOSIS — E785 Hyperlipidemia, unspecified: Secondary | ICD-10-CM

## 2022-09-25 NOTE — Patient Instructions (Signed)
Medication Instructions:  Your physician recommends that you continue on your current medications as directed. Please refer to the Current Medication list given to you today.  *If you need a refill on your cardiac medications before your next appointment, please call your pharmacy*   Lab Work: none If you have labs (blood work) drawn today and your tests are completely normal, you will receive your results only by: MyChart Message (if you have MyChart) OR A paper copy in the mail If you have any lab test that is abnormal or we need to change your treatment, we will call you to review the results.   Testing/Procedures: none   Follow-Up: At [facility], you and your health needs are our priority.  As part of our continuing mission to provide you with exceptional heart care, we have created designated Provider Care Teams.  These Care Teams include your primary Cardiologist (physician) and Advanced Practice Providers (APPs -  Physician Assistants and Nurse Practitioners) who all work together to provide you with the care you need, when you need it.  We recommend signing up for the patient portal called "MyChart".  Sign up information is provided on this After Visit Summary.  MyChart is used to connect with patients for Virtual Visits (Telemedicine).  Patients are able to view lab/test results, encounter notes, upcoming appointments, etc.  Non-urgent messages can be sent to your provider as well.   To learn more about what you can do with MyChart, go to [email or web address].    Your next appointment:   6 month(s)  Provider:   You may see Lance Muss, MDor one of the following Advanced Practice Providers on your designated Care Team:   Nicolasa Ducking, NP Eula Listen, PA-C Cadence Fransico Michael, PA-C Charlsie Quest, NP    Other Instructions

## 2022-09-27 ENCOUNTER — Telehealth (HOSPITAL_BASED_OUTPATIENT_CLINIC_OR_DEPARTMENT_OTHER): Payer: Self-pay

## 2022-09-27 NOTE — Telephone Encounter (Signed)
Received fax from [facility] in [place] requesting refills for Plavix 75 mg.  Routing to [facility] refill pool.

## 2022-09-27 NOTE — Telephone Encounter (Signed)
Attempted to contact the patient regarding refill request. RX was sent on 02/13/22 for a yr supply.

## 2023-02-13 DIAGNOSIS — L299 Pruritus, unspecified: Secondary | ICD-10-CM | POA: Diagnosis not present

## 2023-02-13 DIAGNOSIS — J019 Acute sinusitis, unspecified: Secondary | ICD-10-CM | POA: Diagnosis not present

## 2023-02-13 DIAGNOSIS — Z20828 Contact with and (suspected) exposure to other viral communicable diseases: Secondary | ICD-10-CM | POA: Diagnosis not present

## 2023-02-13 DIAGNOSIS — R059 Cough, unspecified: Secondary | ICD-10-CM | POA: Diagnosis not present

## 2023-02-13 DIAGNOSIS — R5383 Other fatigue: Secondary | ICD-10-CM | POA: Diagnosis not present

## 2023-02-13 DIAGNOSIS — R21 Rash and other nonspecific skin eruption: Secondary | ICD-10-CM | POA: Diagnosis not present

## 2023-02-13 DIAGNOSIS — M791 Myalgia, unspecified site: Secondary | ICD-10-CM | POA: Diagnosis not present

## 2023-02-13 DIAGNOSIS — E119 Type 2 diabetes mellitus without complications: Secondary | ICD-10-CM | POA: Diagnosis not present

## 2023-02-13 DIAGNOSIS — Z1159 Encounter for screening for other viral diseases: Secondary | ICD-10-CM | POA: Diagnosis not present

## 2023-02-14 ENCOUNTER — Ambulatory Visit
Admission: RE | Admit: 2023-02-14 | Discharge: 2023-02-14 | Disposition: A | Discharge status: Home / Self Care | Payer: Medicare HMO | Source: Ambulatory Visit | Attending: Nurse Practitioner | Admitting: Nurse Practitioner

## 2023-02-14 ENCOUNTER — Other Ambulatory Visit: Payer: Self-pay | Admitting: Nurse Practitioner

## 2023-02-14 DIAGNOSIS — R059 Cough, unspecified: Secondary | ICD-10-CM

## 2023-02-14 DIAGNOSIS — I700 Atherosclerosis of aorta: Secondary | ICD-10-CM | POA: Diagnosis not present

## 2023-02-22 DIAGNOSIS — S70361A Insect bite (nonvenomous), right thigh, initial encounter: Secondary | ICD-10-CM | POA: Diagnosis not present

## 2023-02-22 DIAGNOSIS — R799 Abnormal finding of blood chemistry, unspecified: Secondary | ICD-10-CM | POA: Diagnosis not present

## 2023-03-01 DIAGNOSIS — N390 Urinary tract infection, site not specified: Secondary | ICD-10-CM | POA: Diagnosis not present

## 2023-03-09 ENCOUNTER — Telehealth: Payer: Self-pay | Admitting: Interventional Cardiology

## 2023-03-09 ENCOUNTER — Other Ambulatory Visit (HOSPITAL_BASED_OUTPATIENT_CLINIC_OR_DEPARTMENT_OTHER): Payer: Self-pay | Admitting: Family

## 2023-03-09 DIAGNOSIS — I25118 Atherosclerotic heart disease of native coronary artery with other forms of angina pectoris: Secondary | ICD-10-CM

## 2023-03-09 DIAGNOSIS — I5022 Chronic systolic (congestive) heart failure: Secondary | ICD-10-CM

## 2023-03-09 MED ORDER — METOPROLOL SUCCINATE ER 50 MG PO TB24: 50.0000 mg | ORAL_TABLET | Freq: Every day | ORAL | 1 refills | Status: AC

## 2023-03-09 NOTE — Telephone Encounter (Signed)
Pt's medication was sent to pt's pharmacy as requested. Confirmation received.  °

## 2023-03-09 NOTE — Telephone Encounter (Signed)
*  STAT* If patient is at the pharmacy, call can be transferred to refill team.   1. Which medications need to be refilled? (please list name of each medication and dose if known)   metoprolol succinate (TOPROL-XL) 50 MG 24 hr tablet   2. Would you like to learn more about the convenience, safety, & potential cost savings by using the [facility]?   3. Are you open to using the [facility] (Type [facility]. ).  4. Which pharmacy/location (including street and city if local pharmacy) is medication to be sent to?  [id number] - [place], [place] - [address] AT [facility] OF [address]   5. Do they need a 30 day or 90 day supply?   90 day   Patient stated she is completely out of this medication.

## 2023-03-19 DIAGNOSIS — N390 Urinary tract infection, site not specified: Secondary | ICD-10-CM | POA: Diagnosis not present

## 2023-04-04 ENCOUNTER — Other Ambulatory Visit (HOSPITAL_BASED_OUTPATIENT_CLINIC_OR_DEPARTMENT_OTHER): Payer: Self-pay | Admitting: Family

## 2023-04-04 DIAGNOSIS — I5022 Chronic systolic (congestive) heart failure: Secondary | ICD-10-CM

## 2023-04-04 DIAGNOSIS — I25118 Atherosclerotic heart disease of native coronary artery with other forms of angina pectoris: Secondary | ICD-10-CM

## 2023-05-08 ENCOUNTER — Ambulatory Visit (HOSPITAL_BASED_OUTPATIENT_CLINIC_OR_DEPARTMENT_OTHER): Payer: Medicare Other | Admitting: Family

## 2023-06-18 ENCOUNTER — Other Ambulatory Visit: Payer: Self-pay | Admitting: Student

## 2023-06-18 DIAGNOSIS — Z87891 Personal history of nicotine dependence: Secondary | ICD-10-CM

## 2023-06-24 ENCOUNTER — Other Ambulatory Visit: Payer: Self-pay

## 2023-06-24 ENCOUNTER — Emergency Department (HOSPITAL_BASED_OUTPATIENT_CLINIC_OR_DEPARTMENT_OTHER)
Admission: EM | Admit: 2023-06-24 | Discharge: 2023-06-24 | Disposition: A | Discharge status: Home / Self Care | Payer: PPO | Attending: Emergency Medicine | Admitting: Emergency Medicine

## 2023-06-24 ENCOUNTER — Emergency Department (HOSPITAL_BASED_OUTPATIENT_CLINIC_OR_DEPARTMENT_OTHER): Payer: PPO

## 2023-06-24 DIAGNOSIS — I110 Hypertensive heart disease with heart failure: Secondary | ICD-10-CM | POA: Diagnosis not present

## 2023-06-24 DIAGNOSIS — I509 Heart failure, unspecified: Secondary | ICD-10-CM | POA: Insufficient documentation

## 2023-06-24 DIAGNOSIS — Z7982 Long term (current) use of aspirin: Secondary | ICD-10-CM | POA: Insufficient documentation

## 2023-06-24 DIAGNOSIS — S0083XA Contusion of other part of head, initial encounter: Secondary | ICD-10-CM | POA: Insufficient documentation

## 2023-06-24 DIAGNOSIS — W19XXXA Unspecified fall, initial encounter: Secondary | ICD-10-CM

## 2023-06-24 DIAGNOSIS — I2510 Atherosclerotic heart disease of native coronary artery without angina pectoris: Secondary | ICD-10-CM | POA: Insufficient documentation

## 2023-06-24 DIAGNOSIS — Z79899 Other long term (current) drug therapy: Secondary | ICD-10-CM | POA: Insufficient documentation

## 2023-06-24 DIAGNOSIS — Z7902 Long term (current) use of antithrombotics/antiplatelets: Secondary | ICD-10-CM | POA: Diagnosis not present

## 2023-06-24 DIAGNOSIS — J449 Chronic obstructive pulmonary disease, unspecified: Secondary | ICD-10-CM | POA: Diagnosis not present

## 2023-06-24 DIAGNOSIS — W08XXXA Fall from other furniture, initial encounter: Secondary | ICD-10-CM | POA: Diagnosis not present

## 2023-06-24 DIAGNOSIS — H1131 Conjunctival hemorrhage, right eye: Secondary | ICD-10-CM | POA: Insufficient documentation

## 2023-06-24 DIAGNOSIS — S0990XA Unspecified injury of head, initial encounter: Secondary | ICD-10-CM | POA: Diagnosis present

## 2023-06-24 DIAGNOSIS — S0993XA Unspecified injury of face, initial encounter: Secondary | ICD-10-CM

## 2023-06-24 NOTE — ED Provider Notes (Signed)
 [place] [facility] AT [facility] Provider Note   CSN: [id number] Arrival date & time: 06/24/23  1405     History  Chief Complaint  Patient presents with   Kathryn Garrett    Kathryn Garrett is a 78 y.o. female with history of hypertension, CAD, STEMI, CHF, COPD, peripheral vascular disease, opioid use disorder on Suboxone , hyperlipidemia, who presents the emergency department after a fall.  Patient states that 3 days ago she was sleeping on the sofa, when she rolled off and struck her face on the ground.  She did not lose consciousness.  She is on Plavix , but held it starting yesterday.  She was able to see her eye doctor after the fall.  Her eye doctor said that her eye looks normal.  Her family was hoping to get some imaging to make sure that everything looks okay.   Fall       Home Medications Prior to Admission medications   Medication Sig Start Date End Date Taking? Authorizing Provider  acetaminophen  (TYLENOL ) 500 MG tablet Take 1,000 mg by mouth every 6 (six) hours as needed for headache.    [provider]  albuterol  (PROVENTIL  HFA;VENTOLIN  HFA) 108 (90 Base) MCG/ACT inhaler Inhale 2 puffs into the lungs every 4 (four) hours as needed for wheezing or shortness of breath.    [provider]  aspirin  EC 81 MG tablet Take 81 mg by mouth at bedtime.     [provider]  azelastine  (ASTELIN ) 0.1 % nasal spray Place 2 sprays into both nostrils daily. 08/14/17   [provider]  Buprenorphine  HCl-Naloxone  HCl 8-2 MG FILM Place 0.5 Film under the tongue daily.    [provider]  cetirizine  (ZYRTEC ) 10 MG tablet Take 1 tablet (10 mg total) by mouth daily. 03/11/18   [name] HERO, PA-C  clopidogrel  (PLAVIX ) 75 MG tablet Take 1 tablet (75 mg total) by mouth daily. 02/13/22   Walker, Caitlin S, NP  ferrous sulfate 325 (65 FE) MG tablet Take 325 mg by mouth daily with breakfast.    [provider]  food thickener  (THICK IT) POWD Use as needed for honey thick liquids 10/12/20   Antoinette Doe, MD  furosemide  (LASIX ) 20 MG tablet Take 1 tablet (20 mg total) by mouth daily. MAY TAKE AN EXTRA 20 MG AS NEEDED FOR 2 LB WEIGHT GAIN IN 24 OR 5 LB WEIGHT GAIN IN 5 DAYS Patient taking differently: Take 20 mg by mouth daily. MAY TAKE AN EXTRA 20 MG AS NEEDED FOR 2 LB WEIGHT GAIN IN 24 OR 5 LB WEIGHT GAIN IN 5 DAYS Taking PRN 07/03/22   Walker, Caitlin S, NP  levothyroxine  (SYNTHROID ) 88 MCG tablet Take 88 mcg by mouth daily. 05/26/20   [provider]  losartan  (COZAAR ) 50 MG tablet Take 1 tablet (50 mg total) by mouth daily. 02/14/22 02/09/23  Vannie Reche RAMAN, NP  meloxicam (MOBIC) 7.5 MG tablet Take 7.5 mg by mouth daily. 07/26/22   [provider]  metoprolol  succinate (TOPROL -XL) 50 MG 24 hr tablet Take 1 tablet (50 mg total) by mouth daily. Take with or immediately following a meal. 03/09/23   Vannie Reche RAMAN, NP  nitroGLYCERIN  (NITROSTAT ) 0.4 MG SL tablet Place 1 tablet (0.4 mg total) under the tongue every 5 (five) minutes as needed for chest pain. 11/02/20 03/10/21  Walker, Caitlin S, NP  nystatin  cream (MYCOSTATIN ) Apply topically 2 (two) times daily. 07/06/22   [provider]  ondansetron  (  ZOFRAN ) 4 MG tablet Take 4 mg by mouth every 6 (six) hours. 08/21/22   [provider]  OZEMPIC, 2 MG/DOSE, 8 MG/3ML SOPN Inject into the skin. 08/21/22   [provider]  pantoprazole  (PROTONIX ) 40 MG tablet Take 1 tablet (40 mg total) by mouth daily. Further refills per PCP. Patient taking differently: Take 40 mg by mouth daily. 11/02/20   Walker, Caitlin S, NP  rosuvastatin  (CRESTOR ) 20 MG tablet Take 1 tablet (20 mg total) by mouth daily. 02/13/22 02/08/23  Vannie Reche RAMAN, NP  TRELEGY ELLIPTA 100-62.5-25 MCG/INH AEPB Take 1 puff by mouth daily. 08/13/20   [provider]  triamcinolone  cream (KENALOG ) 0.1 % Apply 1 application topically 2 (two) times daily. to affected area  08/11/20   [provider]      Allergies    Azithromycin     Review of Systems   Review of Systems  HENT:         Head injury  All other systems reviewed and are negative.   Physical Exam Updated Vital Signs BP (!) 169/88   Pulse 97   Temp 97.8 F (36.6 C)   Resp 17   Ht 4' 11 (1.499 m)   Wt 63.5 kg   SpO2 99%   BMI 28.27 kg/m  Physical Exam Vitals and nursing note reviewed.  Constitutional:      Appearance: Normal appearance.  HENT:     Head: Normocephalic.     Comments: Contusion noted to the right forehead, ecchymoses around the right eye Eyes:     Extraocular Movements: Extraocular movements intact.     Conjunctiva/sclera:     Right eye: Hemorrhage present.     Comments: Conjunctival hemorrhage on the right  Neck:     Comments: No midline spinal tenderness or deformities palpated Pulmonary:     Effort: Pulmonary effort is normal. No respiratory distress.  Skin:    General: Skin is warm and dry.  Neurological:     Mental Status: She is alert.  Psychiatric:        Mood and Affect: Mood normal.        Behavior: Behavior normal.     ED Results / Procedures / Treatments   Labs (all labs ordered are listed, but only abnormal results are displayed) Labs Reviewed - No data to display  EKG None  Radiology CT Cervical Spine Wo Contrast Result Date: 06/24/2023 CLINICAL DATA:  Clemens off sofa 3 days ago, anticoagulated EXAM: CT CERVICAL SPINE WITHOUT CONTRAST TECHNIQUE: Multidetector CT imaging of the cervical spine was performed without intravenous contrast. Multiplanar CT image reconstructions were also generated. RADIATION DOSE REDUCTION: This exam was performed according to the departmental dose-optimization program which includes automated exposure control, adjustment of the mA and/or kV according to patient size and/or use of iterative reconstruction technique. COMPARISON:  None Available. FINDINGS: Alignment: Alignment is grossly anatomic. Skull base  and vertebrae: No acute fracture. No primary bone lesion or focal pathologic process. Soft tissues and spinal canal: No prevertebral fluid or swelling. No visible canal hematoma. Disc levels: Prominent facet hypertrophic changes from C2-3 through C5-6. Disc spaces are relatively well preserved. Mild hypertrophic changes at the C1-C2 interface. Upper chest: Stable emphysema at the lung apices. 7 mm right upper lobe pulmonary nodule unchanged since prior exam. Given long-term stability, no specific follow-up is warranted. Central airway is patent. Atherosclerosis of the aortic arch. Other: Reconstructed images demonstrate no additional findings. IMPRESSION: 1. No acute cervical spine fracture. 2. Multilevel cervical  degenerative changes as above. 3. Aortic Atherosclerosis (ICD10-I70.0) and Emphysema (ICD10-J43.9). Electronically Signed   By: Ozell Daring M.D.   On: 06/24/2023 15:38   CT Head Wo Contrast Result Date: 06/24/2023 CLINICAL DATA:  Clemens from sofa 3 days ago, anticoagulated EXAM: CT HEAD WITHOUT CONTRAST TECHNIQUE: Contiguous axial images were obtained from the base of the skull through the vertex without intravenous contrast. RADIATION DOSE REDUCTION: This exam was performed according to the departmental dose-optimization program which includes automated exposure control, adjustment of the mA and/or kV according to patient size and/or use of iterative reconstruction technique. COMPARISON:  09/17/2020 FINDINGS: Brain: No acute infarct or hemorrhage. Scattered hypodensities within the periventricular white matter consistent with chronic small vessel ischemic changes, stable. Lateral ventricles and midline structures are unremarkable. No acute extra-axial fluid collections. No mass effect. Vascular: Atherosclerosis of the internal carotid arteries. No hyperdense vessel. Skull: Small right frontal scalp hematoma. No underlying fracture. The remainder of the calvarium is unremarkable. Sinuses/Orbits: Stable  benign osteomas within the frontoethmoidal regions. No acute sinus disease. Other: None. IMPRESSION: 1. Small right frontal scalp hematoma.  No underlying fracture. 2. No acute intracranial process. Electronically Signed   By: Ozell Daring M.D.   On: 06/24/2023 15:34    Procedures Procedures    Medications Ordered in ED Medications - No data to display  ED Course/ Medical Decision Making/ A&P                                 Medical Decision Making Amount and/or Complexity of Data Reviewed Radiology: ordered.   This patient is a 78 y.o. female  who presents to the ED for concern of fall with facial injury.   Differential diagnoses prior to evaluation: The emergent differential diagnosis includes, but is not limited to, fracture, dislocation, intracranial bleed. This is not an exhaustive differential.   Past Medical History / Co-morbidities / Social History: hypertension, CAD, STEMI, CHF, COPD, peripheral vascular disease, opioid use disorder on Suboxone , hyperlipidemia  Physical Exam: Physical exam performed. The pertinent findings include: Hypertensive, otherwise normal vital signs.  No acute distress.  Contusion noted to the forehead, with bruising around the right eye and a conjunctival hemorrhage on the right. EOMI.   Lab Tests/Imaging studies: I personally interpreted labs/imaging and the pertinent results include: CT head with small right frontal scalp hematoma, cervical spine unremarkable. I agree with the radiologist interpretation.  Disposition: After consideration of the diagnostic results and the patients response to treatment, I feel that emergency department workup does not suggest an emergent condition requiring admission or immediate intervention beyond what has been performed at this time. The plan is: Discharge to home with reassurance.  Recommended patient continue to use ice as needed for swelling, Tylenol  as needed for pain.  Recommend she continue to follow-up  with the ophthalmologist. The patient is safe for discharge and has been instructed to return immediately for worsening symptoms, change in symptoms or any other concerns.  Final Clinical Impression(s) / ED Diagnoses Final diagnoses:  Fall, initial encounter  Facial injury, initial encounter  Conjunctival hemorrhage of right eye    Rx / DC Orders ED Discharge Orders     None      Portions of this report may have been transcribed using voice recognition software. Every effort was made to ensure accuracy; however, inadvertent computerized transcription errors may be present.    Corie Friddie DASEN, PA-C 06/24/23 1806  Yolande Lamar BROCKS, MD 06/25/23 1344

## 2023-06-24 NOTE — ED Triage Notes (Addendum)
 Pt fell onto hardwood floor from sofa Thur night; facial trauma noted; takes Plavix , but has held it since yesterday; has seen ophthalmologist since fall

## 2023-06-24 NOTE — Discharge Instructions (Signed)
 You were seen in the ER after a fall.  The CT scans of your head and neck showed no broken bones or internal bleeding.   Please continue to follow up with the eye doctor. You can take tylenol  as needed for pain.

## 2023-06-24 NOTE — ED Notes (Signed)
 ED Provider at bedside.

## 2023-06-29 ENCOUNTER — Ambulatory Visit (HOSPITAL_BASED_OUTPATIENT_CLINIC_OR_DEPARTMENT_OTHER): Payer: PPO | Admitting: Family

## 2023-06-29 ENCOUNTER — Encounter (HOSPITAL_BASED_OUTPATIENT_CLINIC_OR_DEPARTMENT_OTHER): Payer: Self-pay | Admitting: Family

## 2023-06-29 VITALS — BP 124/54 | HR 84 | Ht 59.0 in | Wt 137.1 lb

## 2023-06-29 DIAGNOSIS — I10 Essential (primary) hypertension: Secondary | ICD-10-CM

## 2023-06-29 DIAGNOSIS — E785 Hyperlipidemia, unspecified: Secondary | ICD-10-CM

## 2023-06-29 DIAGNOSIS — I25118 Atherosclerotic heart disease of native coronary artery with other forms of angina pectoris: Secondary | ICD-10-CM

## 2023-06-29 DIAGNOSIS — I5022 Chronic systolic (congestive) heart failure: Secondary | ICD-10-CM | POA: Diagnosis not present

## 2023-06-29 DIAGNOSIS — I471 Supraventricular tachycardia, unspecified: Secondary | ICD-10-CM

## 2023-06-29 DIAGNOSIS — Z720 Tobacco use: Secondary | ICD-10-CM

## 2023-06-29 DIAGNOSIS — J449 Chronic obstructive pulmonary disease, unspecified: Secondary | ICD-10-CM

## 2023-06-29 NOTE — Progress Notes (Signed)
Cardiology Office Note:  .   Date:  06/29/2023  ID:  Jadene Pierini, DOB Feb 23, 1946, MRN [id number] PCP: Erskine Emery, NP  [facility] Providers Cardiologist:  Lance Muss, MD Cardiology APP:  Alver Sorrow, NP    History of Present Illness: .   Kathryn Garrett is a 78 y.o. female with a hx of CAD s/p DES to RCA, hypertension, hyperlipidemia, COPD, HFrEF/ICM   Her CAD dates back to inferior MI treated with bare metal stent to RCA in 2010. In 06/2010 she had DES to proximal to mid RCA for ISR of the BMS. Her LM and LAD disease was moderate and not significant. Admitted 01/2016 for unstable angina with cardiac cath showing ISR of RCA for which she underwent balloon angioplasty with ISR 75% ? 0% and Plavix was resumed. She had unstable angina again 05/2016 with cardiac cath showing ISR within proximal RCA treated with PTCA with scoring balloon. She had bruit on exam with subsequent carotid doppler showing bilateral ECA stenosis >50% but no significant ICA stenosis.    Started on Levothyroxine 05/2020 due to hypothyroidism. Hospitalized 09/10/20 with acute hypoxic respiratory failure secondary to COPD exacerbation complicated by flash pulmonary edema and NSTEMI. Echo 09/11/20 LVEF 25-30%, indeterminite diastolic parameters, mild MR. [facility] 09/24/20 showing mild to moderate nonobstructive CAD including  20-35% distal LMCA and 50% ostial LAD disease with FFR not hemodynamicaly significant, prox RCA stent with mild-moderat ICR (30-40%), normal pulmonary pressure, cardiac output/index, filling pressures. Hospital course complicated by gram negative bacteremia, evaluated by ID and treated with meropenem. Had SVT while admitted and was discharged on Toprol. Repeat echo 11/21/2020 LVEF 60 to 65%, no R WMA, mild LVH, grade 1 diastolic dysfunction, RV normal size and function, normal PASP, trivial MR.     Seen 06/07/21. Crestor was resumed at lower dose of 20mg  QD due to reported myalgias.    Admitted 9/5-01/19/22 with acute heart failure, diuresed with IV lasix. Noted home noncompliance with Lasix.  Discharged on lasix 40mg  QD, Losartan 50mg  QD, Metoprolol succiante 50mg .  Lasix later reduced to 20 mg every other day due to elevated creatinine   Seen 02/2022 noting palpitations.  Subsequent ZIO monitor predominantly normal sinus rhythm with 7 short runs of SVT which were not triggered episodes.    Admitted 2/12 - 06/28/2022 to [facility] after presenting with shortness of breath.Initial troponin 229 and BNP 354.  EKG no acute changes.  She was admitted for CHF.  She was discharged on Lasix 40 mg.   Last seen 09/25/22 by Dr. Eldridge Dace doing well from a cardiac perspective.   ED visit 06/24/23 for fall 3 days prior when sleeping on the sofa and rolled off striking her face on the ground. CT head small right frontal scald hematoma, cervical spine unremarkable.   Presents today for follow up. Weight loss of 14 pounds since visit in our clinic 1 year ago. She enjoys being back at work since before Christmas. She reports feeling tired upon waking and general malaise. She does note some dyspnea with hills but none with her ADLs. Notes pain in her left arm "all the time" which is somewhat improved with Tylenol. She notes this is different than her prior anginal equivalent. No chest pain, pressure, tightness. Occasional palpitations which self resolves and is overall not bothersome. She notes she is not taking her thyroid medication regularly, reviewed that hypothyroidism can affect energy levels. Also notes she is not taking her Mounjaro routinely.   ROS:  Please see the history of present illness.    All other systems reviewed and are negative.   Studies Reviewed: Marland Kitchen   EKG Interpretation Date/Time:  Friday June 29 2023 13:55:03 EST Ventricular Rate:  84 PR Interval:  144 QRS Duration:  86 QT Interval:  362 QTC Calculation: 427 R Axis:   10  Text Interpretation: Normal  sinus rhythm No acute changes Confirmed by Gillian Shields ([id number]) on 06/29/2023 2:02:52 PM    Cardiac Studies & Procedures   ______________________________________________________________________________________________ CARDIAC CATHETERIZATION  CARDIAC CATHETERIZATION 09/24/2020  Narrative Conclusions: 1. Mild to moderate, non-obstructive coronary artery disease including 20-35% distal LMCA and 50% ostial LAD disease, similar to prior catheterization when FFR was not hemodynamically significant. 2. Patent proximal RCA stents with mild to moderate in-stent restenosis (30-40%). 3. Normal left and right heart filling pressures. 4. Normal pulmonary artery pressures. 5. Normal cardiac output/index.  Recommendations: 1. Maintain net even fluid balance. 2. Optimize goal-directed medical therapy with plans to repeat limited echo in 4-6 weeks. 3. Continue secondary prevention of coronary artery disease.  Yvonne Kendall, MD [facility]  Findings Coronary Findings Diagnostic  Dominance: Right  Left Main Vessel is moderate in size. Dist LM lesion is 25% stenosed.  Left Anterior Descending Vessel is moderate in size. Ost LAD lesion is 50% stenosed.  Left Circumflex Vessel is moderate in size. Vessel is angiographically normal.  First Obtuse Marginal Branch Vessel is small in size.  Second Obtuse Marginal Branch Vessel is moderate in size.  Right Coronary Artery Vessel is moderate in size. Prox RCA lesion is 35% stenosed. The lesion was previously treated. Previously placed stent displays restenosis.  Right Posterior Descending Artery Vessel is moderate in size.  First Right Posterolateral Branch Vessel is small in size.  Second Right Posterolateral Branch Vessel is small in size.  Third Right Posterolateral Branch Vessel is moderate in size.  Intervention  No interventions have been documented.   CARDIAC CATHETERIZATION  CARDIAC CATHETERIZATION  06/05/2016  Narrative Images from the original result were not included.   Ost RCA lesion, 30 %stenosed.  LM lesion, 30 %stenosed.  Ost LAD lesion, 50 %stenosed - looks similar to prior catheterization. FFR 0.87  The left ventricular systolic function is normal.  LV end diastolic pressure is normal.  The left ventricular ejection fraction is 55-65% by visual estimate.  There is mild (2+) mitral regurgitation.  _______CULPRIT LESION_____________  Prox RCA Overlapped BMS-DES stents, 99 % -n-stent re-stenosed.  PTCA with 3.0 mm Scoring Balloon with Post-dilation using 3.5 mm Delight Balloon was perfromed. Post intervention, there is a 0% residual stenosis.  Recurrent in-stent restenosis of the overlapped stent (BMS and DES) in proximal RCA treated with aggressive angioplasty. Repeat FFR on ostial LAD confirms this is not likely significant.   Plan:  She'll be transferred to 6 Central post procedure unit for sheath removal and post PCI care.  Continue aggressive risk factor modification  Continue aspirin and Plavix lifelong  Smoking cessation counseling is mandatory to maintain stent patency   Bryan Lemma, M.D., M.S. Interventional Cardiologist  Pager # [phone] Phone # [phone] [address] [place], [place] [address]  Findings Coronary Findings Diagnostic  Dominance: Right  Left Main Vessel is large. The lesion is tubular and smooth.  Left Anterior Descending Vessel is moderate in size. The vessel exhibits minimal luminal irregularities. The vessel is tortuous. Somewhat tortuous vessel it has several small diagonal branches. It does not reach all the way to the apex. The lesion is  located at the major branch and discrete. The stenosis was measured by a visual reading. Pressure wire/FFR was performed on the lesion. FFR: 0.87.  First Diagonal Branch Vessel is small in size. The vessel exhibits minimal luminal irregularities.  First Septal  Branch Vessel is small in size.  Second Diagonal Branch Vessel is small in size. Vessel is angiographically normal.  Second Septal Branch Vessel is small in size.  Third Diagonal Branch Vessel is small in size.  Third Septal Branch Vessel is small in size.  Left Circumflex Vessel is angiographically normal.  First Obtuse Marginal Branch Vessel is small in size.  Second Obtuse Marginal Branch Vessel is small in size. Vessel is angiographically normal.  Third Obtuse Marginal Branch Vessel is angiographically normal.  Lateral Third Obtuse Marginal Branch Vessel is moderate in size.  Right Coronary Artery Vessel is large.  The lesion is tubular and eccentric. The lesion was previously treated using a bare metal stent, a drug eluting stent and angioplasty between 1-5 months ago. Previously placed stent displays restenosis.  Acute Marginal Branch Vessel is small in size.  Right Posterior Descending Artery Vessel is moderate in size. Vessel is angiographically normal. The vessel is tortuous.  Inferior Septal Vessel is small in size.  Right Posterior Atrioventricular Artery Vessel is moderate in size.  First Right Posterolateral Branch Vessel is small in size.  Second Right Posterolateral Branch Vessel is moderate in size.  Intervention  Prox RCA lesion Angioplasty Lesion length: 20 mm. Lesion crossed with guidewire using a WIRE ASAHI PROWATER 180CM. Angioplasty alone was performed using a BALLOON [place] MOZEC 3.5X15. Maximum pressure: 16 atm. Inflation time: 30 sec. Initial: BALLOON EUPHORA RX2.5X12 ([id number]) - 12 Atm x 20 Sec Scoring Balloon: BALLOON ANGIOSCULPT RX 3.0X10 ([id number]) - 16 Atm x 30 Sec x 3 CATH LAUNCHER 72F JR4 (Yes)  Used: unable to advance 6Fr Catheter into distal Aorta due to potential stenosis. There is a 0% residual stenosis post intervention.     ECHOCARDIOGRAM  ECHOCARDIOGRAM COMPLETE 11/24/2020  Narrative ECHOCARDIOGRAM REPORT    Patient  Name:   EMMAGENE ORTNER Date of Exam: 11/24/2020 Medical Rec #:  [id number]         Height:       59.0 in Accession #:    [id number]        Weight:       129.0 lb Date of Birth:  05/22/1945         BSA:          1.531 m Patient Age:    74 years          BP:           130/60 mmHg Patient Gender: F                 HR:           56 bpm. Exam Location:  [address]  Procedure: 2D Echo, 3D Echo, Cardiac Doppler and Color Doppler  Indications:    I50.9 CHF  History:        Patient has prior history of Echocardiogram examinations, most recent 09/11/2020. CHF, CAD and Previous Myocardial Infarction, COPD, Arrythmias:Cardiac Arrest; Risk Factors:Dyslipidemia, Family History of Coronary Artery Disease, Hypertension and Current Smoker. STEMI with Cardiac Arrest (prior EF 25-30%).  Sonographer:    Farrel Conners RDCS Referring Phys: [phone] Nitika Jackowski S Guillermo Nehring  IMPRESSIONS   1. Left ventricular ejection fraction, by estimation, is 60 to 65%. The left ventricle has normal function. The  left ventricle has no regional wall motion abnormalities. There is mild concentric left ventricular hypertrophy. Left ventricular diastolic parameters are consistent with Grade I diastolic dysfunction (impaired relaxation). 2. Right ventricular systolic function is normal. The right ventricular size is normal. There is normal pulmonary artery systolic pressure. The estimated right ventricular systolic pressure is 31.1 mmHg. 3. The mitral valve is normal in structure. Trivial mitral valve regurgitation. No evidence of mitral stenosis. 4. The aortic valve is tricuspid. Aortic valve regurgitation is not visualized. No aortic stenosis is present. 5. The inferior vena cava is normal in size with greater than 50% respiratory variability, suggesting right atrial pressure of 3 mmHg.  Comparison(s): Compared to prior TTE in 08/2020, the LVEF has returned to normal (60-65% from 25-30%).  FINDINGS Left Ventricle: Left  ventricular ejection fraction, by estimation, is 60 to 65%. The left ventricle has normal function. The left ventricle has no regional wall motion abnormalities. 3D left ventricular ejection fraction analysis performed but not reported based on interpreter judgement due to suboptimal quality. The left ventricular internal cavity size was normal in size. There is mild concentric left ventricular hypertrophy. Left ventricular diastolic parameters are consistent with Grade I diastolic dysfunction (impaired relaxation).  Right Ventricle: The right ventricular size is normal. No increase in right ventricular wall thickness. Right ventricular systolic function is normal. There is normal pulmonary artery systolic pressure. The tricuspid regurgitant velocity is 2.65 m/s, and with an assumed right atrial pressure of 3 mmHg, the estimated right ventricular systolic pressure is 31.1 mmHg.  Left At[facility]: Left atrial size was normal in size.  Right [facility]: Right atrial size was normal in size.  Pericardium: There is no evidence of pericardial effusion.  Mitral Valve: The mitral valve is normal in structure. There is mild thickening of the mitral valve leaflet(s). There is mild calcification of the mitral valve leaflet(s). Mild mitral annular calcification. Trivial mitral valve regurgitation. No evidence of mitral valve stenosis.  Tricuspid Valve: The tricuspid valve is normal in structure. Tricuspid valve regurgitation is trivial.  Aortic Valve: The aortic valve is tricuspid. Aortic valve regurgitation is not visualized. No aortic stenosis is present.  Pulmonic Valve: The pulmonic valve was normal in structure. Pulmonic valve regurgitation is trivial.  Aorta: The aortic root and ascending aorta are structurally normal, with no evidence of dilitation.  Venous: The inferior vena cava is normal in size with greater than 50% respiratory variability, suggesting right atrial pressure of 3 mmHg.  IAS/Shunts: No  atrial level shunt detected by color flow Doppler.   LEFT VENTRICLE PLAX 2D LVIDd:         4.50 cm  Diastology LVIDs:         2.00 cm  LV e' medial:    5.33 cm/s LV PW:         0.95 cm  LV E/e' medial:  16.1 LV IVS:        0.85 cm  LV e' lateral:   5.87 cm/s LVOT diam:     2.00 cm  LV E/e' lateral: 14.6 LV SV:         89 LV SV Index:   58 LVOT Area:     3.14 cm  3D Volume EF: 3D EF:        63 % LV EDV:       91 ml LV ESV:       34 ml LV SV:        57 ml  RIGHT VENTRICLE RV S prime:  15.80 cm/s TAPSE (M-mode): 2.2 cm  LEFT ATRIUM             Index       RIGHT ATRIUM           Index LA diam:        3.50 cm 2.29 cm/m  RA Area:     11.10 cm LA Vol (A2C):   48.3 ml 31.55 ml/m RA Volume:   20.80 ml  13.59 ml/m LA Vol (A4C):   38.7 ml 25.28 ml/m LA Biplane Vol: 43.4 ml 28.35 ml/m AORTIC VALVE LVOT Vmax:   120.00 cm/s LVOT Vmean:  66.900 cm/s LVOT VTI:    0.282 m  AORTA Ao Root diam: 2.70 cm Ao Asc diam:  3.00 cm  MITRAL VALVE                TRICUSPID VALVE MV Area (PHT): cm          TR Peak grad:   28.1 mmHg MV Decel Time: 316 msec     TR Vmax:        265.00 cm/s MV E velocity: 85.70 cm/s MV A velocity: 121.00 cm/s  SHUNTS MV E/A ratio:  0.71         Systemic VTI:  0.28 m Systemic Diam: 2.00 cm  Laurance Flatten MD Electronically signed by Laurance Flatten MD Signature Date/Time: 11/24/2020/4:16:15 PM    Final    MONITORS  LONG TERM MONITOR (3-14 DAYS) 03/15/2022  Narrative   Normal sinus rhythm with rare PACs and rare PVCs.   Rare, brief runs of PACs, not associated with symptoms.   No atrial fibrillation.   No symptoms reported.   No sustained pathologic arrhythmias.   Patch Wear Time:  14 days and 0 hours (2023-10-02T13:51:55-0400 to 2023-10-16T13:51:59-0400)  Patient had a min HR of 52 bpm, max HR of 158 bpm, and avg HR of 74 bpm. Predominant underlying rhythm was Sinus Rhythm. 7 Supraventricular Tachycardia runs occurred, the run with the  fastest interval lasting 8 beats with a max rate of 158 bpm, the longest lasting 13 beats with an avg rate of 124 bpm. Isolated SVEs were rare (<1.0%), SVE Couplets were rare (<1.0%), and SVE Triplets were rare (<1.0%). Isolated VEs were rare (<1.0%), VE Couplets were rare (<1.0%), and no VE Triplets were present. Ventricular Trigeminy was present.       ______________________________________________________________________________________________      Risk Assessment/Calculations:             Physical Exam:   VS:  BP (!) 124/54 (BP Location: Left Arm, Patient Position: Sitting, Cuff Size: Normal)   Pulse 84   Ht 4\' 11"  (1.499 m)   Wt 137 lb 1.6 oz (62.2 kg)   SpO2 95%   BMI 27.69 kg/m    Wt Readings from Last 3 Encounters:  06/29/23 137 lb 1.6 oz (62.2 kg)  06/24/23 139 lb 15.9 oz (63.5 kg)  09/25/22 140 lb (63.5 kg)    GEN: Well nourished, overweight,  well developed in no acute distress NECK: No JVD; No carotid bruits CARDIAC: RRR, no murmurs, rubs, gallops RESPIRATORY:  Clear to auscultation without rales, wheezing or rhonchi  ABDOMEN: Soft, non-tender, non-distended EXTREMITIES:  No edema; No deformity   ASSESSMENT AND PLAN: .    SVT / Sinus tachycardia - Monitor 03/2022 with 7 brief episodes of SVT which were not triggered episodes.  No indication for further workup at this time.  Continue present dose metoprolol succinate 50mg  daily.  Rare palpitations are overall not bothersome.    HTN - BP well controlled. Continue current antihypertensive regimen.  Lasix 20 milligrams daily, losartan 50 mg daily, Toprol 50 mg daily.   CAD s/p DES to RCA / HLD, LDL goal <70 - RCA initially with BMS with ISR requiring DES and subsequent balloon angioplasty. LHC 09/2020 mild to moderate nonobstructive disease of distal LMCA and LAD with patent prox RCA stent mild to moderate ISR (30-40%).Stable with no anginal symptoms. No indication for ischemic evaluation.  GDMT aspirin, Toprol  succinate 50 mg daily, Crestor 20 mg daily.  09/2022 LDL 46.   Chronic systolic and diastolic heart failure with recovered LVEF./ Ischemic cardiomyopathy -Jardiance previously discontinued due to UTI.  GDMT includes Toprol 50 mg daily, losartan 50 mg daily, Lasix 20 mg daily. Euvolemic and well compensated on exam. Low sodium diet, fluid restriction <2L, and daily weights encouraged. Educated to contact our office for weight gain of 2 lbs overnight or 5 lbs in one week.   Fatigue / Hypothyroidism - Anticipate her fatigue is related to not taking her thyroid medication routinely. She has PCP visit Monday and encouraged to discuss with them. If fatigue persists, could consider updating echocardiogram however as no edema nor dyspnea low suspicion for recurrent heart failure.          Dispo: follow up in 3-4 months  Signed, Alver Sorrow, NP

## 2023-06-29 NOTE — Patient Instructions (Addendum)
Medication Instructions:  Continue your current medications.   *If you need a refill on your cardiac medications before your next appointment, please call your pharmacy*  Testing/Procedures: Your EKG today shows normal sinus rhythm which is a good result!  Follow-Up: At [facility], you and your health needs are our priority.  As part of our continuing mission to provide you with exceptional heart care, we have created designated Provider Care Teams.  These Care Teams include your primary Cardiologist (physician) and Advanced Practice Providers (APPs -  Physician Assistants and Nurse Practitioners) who all work together to provide you with the care you need, when you need it.  We recommend signing up for the patient portal called "MyChart".  Sign up information is provided on this After Visit Summary.  MyChart is used to connect with patients for Virtual Visits (Telemedicine).  Patients are able to view lab/test results, encounter notes, upcoming appointments, etc.  Non-urgent messages can be sent to your provider as well.   To learn more about what you can do with MyChart, go to [email or web address].    Your next appointment:   3-4 month(s)  Provider:   Gillian Shields, NP    Other Instructions     Recommend discussing your fatigue with your primary care provider. Often times if you are not taking your thyroid medication on a regular basis it can cause you to feel more tired.

## 2023-07-17 ENCOUNTER — Ambulatory Visit: Payer: PPO | Attending: Student

## 2023-08-09 ENCOUNTER — Other Ambulatory Visit: Payer: Self-pay

## 2023-08-09 DIAGNOSIS — E785 Hyperlipidemia, unspecified: Secondary | ICD-10-CM

## 2023-08-09 MED ORDER — ROSUVASTATIN CALCIUM 20 MG PO TABS: 20.0000 mg | ORAL_TABLET | Freq: Every day | ORAL | 3 refills | Status: AC

## 2023-08-28 ENCOUNTER — Encounter: Payer: Self-pay | Admitting: Oncology

## 2023-08-28 ENCOUNTER — Inpatient Hospital Stay

## 2023-08-28 ENCOUNTER — Inpatient Hospital Stay: Attending: Oncology | Admitting: Oncology

## 2023-08-28 VITALS — BP 135/52 | HR 79 | Temp 98.9°F | Resp 16 | Ht 59.0 in | Wt 136.4 lb

## 2023-08-28 DIAGNOSIS — D649 Anemia, unspecified: Secondary | ICD-10-CM

## 2023-08-28 DIAGNOSIS — R7989 Other specified abnormal findings of blood chemistry: Secondary | ICD-10-CM

## 2023-08-28 DIAGNOSIS — Z87891 Personal history of nicotine dependence: Secondary | ICD-10-CM | POA: Diagnosis not present

## 2023-08-28 LAB — IRON AND TIBC
Iron: 56 ug/dL (ref 28–170)
Saturation Ratios: 15 % (ref 10.4–31.8)
TIBC: 363 ug/dL (ref 250–450)
UIBC: 307 ug/dL

## 2023-08-28 LAB — RETICULOCYTES
Immature Retic Fract: 12.3 % (ref 2.3–15.9)
RBC.: 3.16 MIL/uL — ABNORMAL LOW (ref 3.87–5.11)
Retic Count, Absolute: 52.5 10*3/uL (ref 19.0–186.0)
Retic Ct Pct: 1.7 % (ref 0.4–3.1)

## 2023-08-28 LAB — CBC (CANCER CENTER ONLY)
HCT: 31.6 % — ABNORMAL LOW (ref 36.0–46.0)
Hemoglobin: 9.8 g/dL — ABNORMAL LOW (ref 12.0–15.0)
MCH: 31 pg (ref 26.0–34.0)
MCHC: 31 g/dL (ref 30.0–36.0)
MCV: 100 fL (ref 80.0–100.0)
Platelet Count: 278 10*3/uL (ref 150–400)
RBC: 3.16 MIL/uL — ABNORMAL LOW (ref 3.87–5.11)
RDW: 12.8 % (ref 11.5–15.5)
WBC Count: 8 10*3/uL (ref 4.0–10.5)
nRBC: 0 % (ref 0.0–0.2)

## 2023-08-28 LAB — VITAMIN B12: Vitamin B-12: 3377 pg/mL — ABNORMAL HIGH (ref 180–914)

## 2023-08-28 LAB — LACTATE DEHYDROGENASE: LDH: 171 U/L (ref 98–192)

## 2023-08-28 LAB — FERRITIN: Ferritin: 92 ng/mL (ref 11–307)

## 2023-08-28 LAB — FOLATE: Folate: 25 ng/mL (ref >5.9)

## 2023-08-28 NOTE — Progress Notes (Signed)
 [facility]  [phone] [phone]  ID: Jadene Pierini OB: 1946-01-16  MR#: [id number]  AOZ#:[id number]  Patient Care Team: Erskine Emery, NP as PCP - General Corky Crafts, MD as PCP - Cardiology (Cardiology) Alver Sorrow, NP as Nurse Practitioner (Cardiology) Jeralyn Ruths, MD as Consulting Physician (Oncology)  CHIEF COMPLAINT: Anemia, unspecified. Elevated ferritin.  INTERVAL HISTORY: Patient is a 78 year old female who was noted to have a mildly decreased hemoglobin as well as an elevated ferritin on routine blood work.  Patient reports multiple UTIs, but otherwise feels well.  She has no neurologic complaints.  She denies any recent fevers or or illnesses.  She has a good appetite and denies weight loss.  She has no chest pain, shortness of breath, cough, or hemoptysis.  She denies any nausea, vomiting, constipation, or diarrhea.  She has no urinary complaints today.  Patient offers no specific complaints today.  REVIEW OF SYSTEMS:   Review of Systems  Constitutional: Negative.  Negative for fever, malaise/fatigue and weight loss.  Respiratory: Negative.  Negative for cough and shortness of breath.   Cardiovascular: Negative.  Negative for chest pain and leg swelling.  Gastrointestinal: Negative.  Negative for abdominal pain.  Genitourinary: Negative.  Negative for dysuria.  Musculoskeletal: Negative.  Negative for back pain.  Skin: Negative.  Negative for rash.  Neurological: Negative.  Negative for dizziness, focal weakness, weakness and headaches.  Psychiatric/Behavioral: Negative.  The patient is not nervous/anxious.     As per HPI. Otherwise, a complete review of systems is negative.  PAST MEDICAL HISTORY: Past Medical History:  Diagnosis Date   Anemia, mild    Arthritis    "qwhere" (02/10/2016)   CAD (coronary artery disease)    a. VF arrest 01/2009/CAD with inferoposterior MI s/p aspiration  thrombectomy/BMS of RCA at that time. b. ISR of BMS s/p DES to RCA 06/2010 with moderate LM/LAD disease not significant by FFR. c. s/p angiosculpt PTCA to prox RCA 01/2016 for ISR. d. Aggressive PTCA to prox RCA for ISR 05/2016.   Cardiac arrest (HCC) 01/2009   a. in setting of inf-post STEMI 01/2009 (VF).   Chest pain 06/04/2016   Chronic bronchitis (HCC)    Chronic combined systolic and diastolic CHF (congestive heart failure) (HCC)    a. EF 40-45% by cath 2010. b. 60-65% with grade 1 DD by echo 09/2015   COPD (chronic obstructive pulmonary disease) (HCC)    Hyperlipidemia 01/28/2016   Hyperlipidemia LDL goal <70 01/28/2016   Hypertension    Lung mass    MI (myocardial infarction) (HCC) 01/2009   Narcotic abuse (HCC)    pt now taking Suboxone tid   Pleural effusion on left 09/24/2015   Pneumonia 06/2014; 09/2015   Pre-diabetes    Pulmonary nodule    PVD (peripheral vascular disease) (HCC)    mild atherosclerosis of infrarenal aorta, 25% ostial left renal artery stenosis, 50% ostial right common iliac by cath 2010   S/P PTCA (percutaneous transluminal coronary angioplasty) 02/10/16 to RCA lesion for in stent restenois 02/11/2016   Subclavian artery stenosis (HCC)    a. >50% by duplex 05/2016.   Tobacco abuse    Unstable angina (HCC)     PAST SURGICAL HISTORY: Past Surgical History:  Procedure Laterality Date   ABDOMINAL HYSTERECTOMY     ANKLE FRACTURE SURGERY Right 2000s   CARDIAC CATHETERIZATION N/A 02/10/2016   Procedure: Left Heart Cath and Coronary Angiography;  Surgeon: Corky Crafts, MD;  Location: [facility] INVASIVE CV LAB;  Service: Cardiovascular;  Laterality: N/A;   CARDIAC CATHETERIZATION N/A 02/10/2016   Procedure: Coronary Balloon Angioplasty;  Surgeon: Corky Crafts, MD;  Location: [facility] INVASIVE CV LAB;  Service: Cardiovascular;  Laterality: N/A;  instent RCA   CARDIAC CATHETERIZATION N/A 06/05/2016   Procedure: Left Heart Cath and Coronary Angiography;  Surgeon: Marykay Lex, MD;  Location: [facility] INVASIVE CV LAB;  Service: Cardiovascular;  Laterality: N/A;   CARDIAC CATHETERIZATION N/A 06/05/2016   Procedure: Coronary Balloon Angioplasty;  Surgeon: Marykay Lex, MD;  Location: [facility] INVASIVE CV LAB;  Service: Cardiovascular;  Laterality: N/A;   CHEST TUBE INSERTION N/A 10/08/2015   Procedure: PLEURX CATH REMOVAL;  Surgeon: Hulda Marin, MD;  Location: [facility] ORS;  Service: Thoracic;  Laterality: N/A;   CORONARY ANGIOPLASTY WITH STENT PLACEMENT  01/2009; 2012   FRACTURE SURGERY     RIGHT/LEFT HEART CATH AND CORONARY ANGIOGRAPHY N/A 09/24/2020   Procedure: RIGHT/LEFT HEART CATH AND CORONARY ANGIOGRAPHY;  Surgeon: Yvonne Kendall, MD;  Location: [facility] INVASIVE CV LAB;  Service: Cardiovascular;  Laterality: N/A;   VIDEO ASSISTED THORACOSCOPY (VATS)/THOROCOTOMY Left 09/29/2015   Procedure: PREOP BRONCHOSCOPY, LEFT THORACOSCOPY, POSSIBLE THORACOTOMY, PLEURAL BIOPSY, TALC;  Surgeon: Hulda Marin, MD;  Location: [facility] ORS;  Service: General;  Laterality: Left;    FAMILY HISTORY: Family History  Problem Relation Age of Onset   Coronary artery disease Father    Hyperlipidemia Father    Early death Father    Heart attack Father    Coronary artery disease Mother    Hyperlipidemia Mother    Heart attack Mother    Cancer Brother     ADVANCED DIRECTIVES (Y/N):  N  HEALTH MAINTENANCE: Social History   Tobacco Use   Smoking status: Former    Current packs/day: 0.00    Average packs/day: 0.5 packs/day for 56.0 years (28.0 ttl pk-yrs)    Types: Cigarettes    Start date: 09/10/1964    Quit date: 09/10/2020    Years since quitting: 2.9   Smokeless tobacco: Never  Vaping Use   Vaping status: Never Used  Substance Use Topics   Alcohol use: No    Alcohol/week: 0.0 standard drinks of alcohol   Drug use: No     Colonoscopy:  PAP:  Bone density:  Lipid panel:  Allergies  Allergen Reactions   Pregabalin Other (See Comments)    'Makes me extremely sedated'    Azithromycin Rash    Current Outpatient Medications  Medication Sig Dispense Refill   acetaminophen (TYLENOL) 500 MG tablet Take 1,000 mg by mouth every 6 (six) hours as needed for headache.     albuterol (PROVENTIL HFA;VENTOLIN HFA) 108 (90 Base) MCG/ACT inhaler Inhale 2 puffs into the lungs every 4 (four) hours as needed for wheezing or shortness of breath.     aspirin EC 81 MG tablet Take 81 mg by mouth at bedtime.      azelastine (ASTELIN) 0.1 % nasal spray Place 2 sprays into both nostrils daily.  0   Buprenorphine HCl-Naloxone HCl 8-2 MG FILM Place 0.5 Film under the tongue daily.     cetirizine (ZYRTEC) 10 MG tablet Take 1 tablet (10 mg total) by mouth daily. 30 tablet 0   clopidogrel (PLAVIX) 75 MG tablet Take 1 tablet (75 mg total) by mouth daily. 90 tablet 3   ferrous sulfate 325 (65 FE) MG tablet Take 325 mg by mouth daily with breakfast.     furosemide (LASIX) 20 MG tablet  Take 1 tablet (20 mg total) by mouth daily. MAY TAKE AN EXTRA 20 MG AS NEEDED FOR 2 LB WEIGHT GAIN IN 24 OR 5 LB WEIGHT GAIN IN 5 DAYS (Patient taking differently: Take 20 mg by mouth daily. MAY TAKE AN EXTRA 20 MG AS NEEDED FOR 2 LB WEIGHT GAIN IN 24 OR 5 LB WEIGHT GAIN IN 5 DAYS Taking PRN) 90 tablet 3   levothyroxine (SYNTHROID) 88 MCG tablet Take 88 mcg by mouth daily.     meloxicam (MOBIC) 7.5 MG tablet Take 7.5 mg by mouth daily.     metoprolol succinate (TOPROL-XL) 50 MG 24 hr tablet Take 1 tablet (50 mg total) by mouth daily. Take with or immediately following a meal. 90 tablet 1   nystatin cream (MYCOSTATIN) Apply topically 2 (two) times daily.     ondansetron (ZOFRAN) 4 MG tablet Take 4 mg by mouth every 6 (six) hours.     OZEMPIC, 2 MG/DOSE, 8 MG/3ML SOPN Inject into the skin.     rosuvastatin (CRESTOR) 20 MG tablet Take 1 tablet (20 mg total) by mouth daily. 90 tablet 3   TRELEGY ELLIPTA 100-62.5-25 MCG/INH AEPB Take 1 puff by mouth daily.     triamcinolone cream (KENALOG) 0.1 % Apply 1 application  topically 2 (two) times daily. to affected area     losartan (COZAAR) 50 MG tablet Take 1 tablet (50 mg total) by mouth daily. 90 tablet 3   nitroGLYCERIN (NITROSTAT) 0.4 MG SL tablet Place 1 tablet (0.4 mg total) under the tongue every 5 (five) minutes as needed for chest pain. 25 tablet 3   No current facility-administered medications for this visit.    OBJECTIVE: Vitals:   08/28/23 1336  BP: (!) 135/52  Pulse: 79  Resp: 16  Temp: 98.9 F (37.2 C)  SpO2: 99%     Body mass index is 27.55 kg/m.    ECOG FS:0 - Asymptomatic  General: Well-developed, well-nourished, no acute distress. Eyes: Pink conjunctiva, anicteric sclera. HEENT: Normocephalic, moist mucous membranes. Lungs: No audible wheezing or coughing. Heart: Regular rate and rhythm. Abdomen: Soft, nontender, no obvious distention. Musculoskeletal: No edema, cyanosis, or clubbing. Neuro: Alert, answering all questions appropriately. Cranial nerves grossly intact. Skin: No rashes or petechiae noted. Psych: Normal affect. Lymphatics: No cervical, calvicular, axillary or inguinal LAD.   LAB RESULTS:  Lab Results  Component Value Date   NA 142 07/03/2022   K 4.4 07/03/2022   CL 104 07/03/2022   CO2 21 07/03/2022   GLUCOSE 110 (H) 07/03/2022   BUN 37 (H) 07/03/2022   CREATININE 1.11 (H) 07/03/2022   CALCIUM 9.3 07/03/2022   PROT 7.7 03/10/2021   ALBUMIN 4.0 03/10/2021   AST 26 03/10/2021   ALT 16 03/10/2021   ALKPHOS 70 03/10/2021   BILITOT 0.7 03/10/2021   GFRNONAA >60 03/10/2021   GFRAA 77 10/02/2019    Lab Results  Component Value Date   WBC 8.0 08/28/2023   NEUTROABS 6.7 03/10/2021   HGB 9.8 (L) 08/28/2023   HCT 31.6 (L) 08/28/2023   MCV 100.0 08/28/2023   PLT 278 08/28/2023     STUDIES: No results found.  ASSESSMENT: Anemia, unspecified. Elevated ferritin.  PLAN:    Anemia unspecified: Patient's hemoglobin is mildly decreased at 9.8.  Previously iron panel and folate were within normal  limits.  All of patient's other laboratory work is pending at time of dictation.  No intervention is needed.  She does not have bone marrow biopsy.  Patient will  return to clinic in 3 weeks to discuss her results. Elevated ferritin: Previously, patient's ferritin was noted to be mildly elevated at 213 with an otherwise normal iron panel.  Repeat testing today is pending at time of dictation.  Patient reports she has frequent UTIs and her ferritin may have been elevated secondary to an acute phase reactant.  Laboratory work and follow-up as above.  I spent a total of 45 minutes reviewing chart data, face-to-face evaluation with the patient, counseling and coordination of care as detailed above.   Patient expressed understanding and was in agreement with this plan. She also understands that She can call clinic at any time with any questions, concerns, or complaints.    Shellie Dials, MD   08/28/2023 2:48 PM

## 2023-08-29 LAB — PROTEIN ELECTROPHORESIS, SERUM
A/G Ratio: 1.3 (ref 0.7–1.7)
Albumin ELP: 3.5 g/dL (ref 2.9–4.4)
Alpha-1-Globulin: 0.3 g/dL (ref 0.0–0.4)
Alpha-2-Globulin: 1 g/dL (ref 0.4–1.0)
Beta Globulin: 1 g/dL (ref 0.7–1.3)
Gamma Globulin: 0.4 g/dL (ref 0.4–1.8)
Globulin, Total: 2.6 g/dL (ref 2.2–3.9)
Total Protein ELP: 6.1 g/dL (ref 6.0–8.5)

## 2023-08-29 LAB — ERYTHROPOIETIN: Erythropoietin: 17 m[IU]/mL (ref 2.6–18.5)

## 2023-08-29 LAB — HAPTOGLOBIN: Haptoglobin: 342 mg/dL (ref 42–346)

## 2023-09-12 ENCOUNTER — Encounter: Payer: Self-pay | Admitting: Oncology

## 2023-09-17 ENCOUNTER — Other Ambulatory Visit: Payer: Self-pay | Admitting: Student

## 2023-09-17 DIAGNOSIS — N951 Menopausal and female climacteric states: Secondary | ICD-10-CM

## 2023-09-17 DIAGNOSIS — Z1231 Encounter for screening mammogram for malignant neoplasm of breast: Secondary | ICD-10-CM

## 2023-09-18 ENCOUNTER — Inpatient Hospital Stay: Attending: Oncology | Admitting: Oncology

## 2023-09-18 ENCOUNTER — Encounter: Payer: Self-pay | Admitting: Oncology

## 2023-09-18 VITALS — BP 171/64 | HR 73 | Temp 98.3°F | Resp 16 | Wt 134.5 lb

## 2023-09-18 DIAGNOSIS — Z7982 Long term (current) use of aspirin: Secondary | ICD-10-CM | POA: Insufficient documentation

## 2023-09-18 DIAGNOSIS — I5042 Chronic combined systolic (congestive) and diastolic (congestive) heart failure: Secondary | ICD-10-CM | POA: Diagnosis not present

## 2023-09-18 DIAGNOSIS — Z79899 Other long term (current) drug therapy: Secondary | ICD-10-CM | POA: Insufficient documentation

## 2023-09-18 DIAGNOSIS — Z7902 Long term (current) use of antithrombotics/antiplatelets: Secondary | ICD-10-CM | POA: Diagnosis not present

## 2023-09-18 DIAGNOSIS — I110 Hypertensive heart disease with heart failure: Secondary | ICD-10-CM | POA: Insufficient documentation

## 2023-09-18 DIAGNOSIS — D649 Anemia, unspecified: Secondary | ICD-10-CM | POA: Diagnosis present

## 2023-09-18 DIAGNOSIS — Z87891 Personal history of nicotine dependence: Secondary | ICD-10-CM | POA: Insufficient documentation

## 2023-09-18 NOTE — Progress Notes (Signed)
 [facility]  Telephone:([phone] [phone]  ID: Kathryn Garrett OB: 12-26-1945  MR#: [id number]  UXL#:[id number]  Patient Care Team: Marce Sensing, NP as PCP - General Lucendia Rusk, MD as PCP - Cardiology (Cardiology) Clearnce Curia, NP as Nurse Practitioner (Cardiology) Shellie Dials, MD as Consulting Physician (Oncology)  CHIEF COMPLAINT: Anemia, unspecified.  INTERVAL HISTORY: Patient returns to clinic today for further evaluation and discussion of her laboratory results.  She currently feels well and is asymptomatic.  She does not complain of any weakness or fatigue.  She has no neurologic complaints.  She denies any recent fevers or or illnesses.  She has a good appetite and denies weight loss.  She has no chest pain, shortness of breath, cough, or hemoptysis.  She denies any nausea, vomiting, constipation, or diarrhea.  She has no melena or hematochezia.  She has no urinary complaints today.  Patient offers no specific complaints today.  REVIEW OF SYSTEMS:   Review of Systems  Constitutional: Negative.  Negative for fever, malaise/fatigue and weight loss.  Respiratory: Negative.  Negative for cough and shortness of breath.   Cardiovascular: Negative.  Negative for chest pain and leg swelling.  Gastrointestinal: Negative.  Negative for abdominal pain.  Genitourinary: Negative.  Negative for dysuria.  Musculoskeletal: Negative.  Negative for back pain.  Skin: Negative.  Negative for rash.  Neurological: Negative.  Negative for dizziness, focal weakness, weakness and headaches.  Psychiatric/Behavioral: Negative.  The patient is not nervous/anxious.     As per HPI. Otherwise, a complete review of systems is negative.  PAST MEDICAL HISTORY: Past Medical History:  Diagnosis Date   Anemia, mild    Arthritis    "qwhere" (02/10/2016)   CAD (coronary artery disease)    a. VF arrest 01/2009/CAD with inferoposterior MI s/p aspiration  thrombectomy/BMS of RCA at that time. b. ISR of BMS s/p DES to RCA 06/2010 with moderate LM/LAD disease not significant by FFR. c. s/p angiosculpt PTCA to prox RCA 01/2016 for ISR. d. Aggressive PTCA to prox RCA for ISR 05/2016.   Cardiac arrest (HCC) 01/2009   a. in setting of inf-post STEMI 01/2009 (VF).   Chest pain 06/04/2016   Chronic bronchitis (HCC)    Chronic combined systolic and diastolic CHF (congestive heart failure) (HCC)    a. EF 40-45% by cath 2010. b. 60-65% with grade 1 DD by echo 09/2015   COPD (chronic obstructive pulmonary disease) (HCC)    Hyperlipidemia 01/28/2016   Hyperlipidemia LDL goal <70 01/28/2016   Hypertension    Lung mass    MI (myocardial infarction) (HCC) 01/2009   Narcotic abuse (HCC)    pt now taking Suboxone  tid   Pleural effusion on left 09/24/2015   Pneumonia 06/2014; 09/2015   Pre-diabetes    Pulmonary nodule    PVD (peripheral vascular disease) (HCC)    mild atherosclerosis of infrarenal aorta, 25% ostial left renal artery stenosis, 50% ostial right common iliac by cath 2010   S/P PTCA (percutaneous transluminal coronary angioplasty) 02/10/16 to RCA lesion for in stent restenois 02/11/2016   Subclavian artery stenosis (HCC)    a. >50% by duplex 05/2016.   Tobacco abuse    Unstable angina (HCC)     PAST SURGICAL HISTORY: Past Surgical History:  Procedure Laterality Date   ABDOMINAL HYSTERECTOMY     ANKLE FRACTURE SURGERY Right 2000s   CARDIAC CATHETERIZATION N/A 02/10/2016   Procedure: Left Heart Cath and Coronary Angiography;  Surgeon: Katheren Palomino  Willey Harrier, MD;  Location: [facility] INVASIVE CV LAB;  Service: Cardiovascular;  Laterality: N/A;   CARDIAC CATHETERIZATION N/A 02/10/2016   Procedure: Coronary Balloon Angioplasty;  Surgeon: Lucendia Rusk, MD;  Location: [facility] INVASIVE CV LAB;  Service: Cardiovascular;  Laterality: N/A;  instent RCA   CARDIAC CATHETERIZATION N/A 06/05/2016   Procedure: Left Heart Cath and Coronary Angiography;  Surgeon: Arleen Lacer, MD;  Location: [facility] INVASIVE CV LAB;  Service: Cardiovascular;  Laterality: N/A;   CARDIAC CATHETERIZATION N/A 06/05/2016   Procedure: Coronary Balloon Angioplasty;  Surgeon: Arleen Lacer, MD;  Location: [facility] INVASIVE CV LAB;  Service: Cardiovascular;  Laterality: N/A;   CHEST TUBE INSERTION N/A 10/08/2015   Procedure: PLEURX CATH REMOVAL;  Surgeon: Petra Brandy, MD;  Location: [facility] ORS;  Service: Thoracic;  Laterality: N/A;   CORONARY ANGIOPLASTY WITH STENT PLACEMENT  01/2009; 2012   FRACTURE SURGERY     RIGHT/LEFT HEART CATH AND CORONARY ANGIOGRAPHY N/A 09/24/2020   Procedure: RIGHT/LEFT HEART CATH AND CORONARY ANGIOGRAPHY;  Surgeon: Sammy Crisp, MD;  Location: [facility] INVASIVE CV LAB;  Service: Cardiovascular;  Laterality: N/A;   VIDEO ASSISTED THORACOSCOPY (VATS)/THOROCOTOMY Left 09/29/2015   Procedure: PREOP BRONCHOSCOPY, LEFT THORACOSCOPY, POSSIBLE THORACOTOMY, PLEURAL BIOPSY, TALC ;  Surgeon: Petra Brandy, MD;  Location: [facility] ORS;  Service: General;  Laterality: Left;    FAMILY HISTORY: Family History  Problem Relation Age of Onset   Coronary artery disease Father    Hyperlipidemia Father    Early death Father    Heart attack Father    Coronary artery disease Mother    Hyperlipidemia Mother    Heart attack Mother    Cancer Brother     ADVANCED DIRECTIVES (Y/N):  N  HEALTH MAINTENANCE: Social History   Tobacco Use   Smoking status: Former    Current packs/day: 0.00    Average packs/day: 0.5 packs/day for 56.0 years (28.0 ttl pk-yrs)    Types: Cigarettes    Start date: 09/10/1964    Quit date: 09/10/2020    Years since quitting: 3.0   Smokeless tobacco: Never  Vaping Use   Vaping status: Never Used  Substance Use Topics   Alcohol use: No    Alcohol/week: 0.0 standard drinks of alcohol   Drug use: No     Colonoscopy:  PAP:  Bone density:  Lipid panel:  Allergies  Allergen Reactions   Pregabalin Other (See Comments)    'Makes me extremely sedated'    Azithromycin  Rash    Current Outpatient Medications  Medication Sig Dispense Refill   acetaminophen  (TYLENOL ) 500 MG tablet Take 1,000 mg by mouth every 6 (six) hours as needed for headache.     albuterol  (PROVENTIL  HFA;VENTOLIN  HFA) 108 (90 Base) MCG/ACT inhaler Inhale 2 puffs into the lungs every 4 (four) hours as needed for wheezing or shortness of breath.     aspirin  EC 81 MG tablet Take 81 mg by mouth at bedtime.      azelastine (ASTELIN) 0.1 % nasal spray Place 2 sprays into both nostrils daily.  0   Buprenorphine  HCl-Naloxone  HCl 8-2 MG FILM Place 0.5 Film under the tongue daily.     cetirizine  (ZYRTEC ) 10 MG tablet Take 1 tablet (10 mg total) by mouth daily. 30 tablet 0   clopidogrel  (PLAVIX ) 75 MG tablet Take 1 tablet (75 mg total) by mouth daily. 90 tablet 3   furosemide  (LASIX ) 20 MG tablet Take 1 tablet (20 mg total) by mouth daily. MAY TAKE AN EXTRA 20 MG  AS NEEDED FOR 2 LB WEIGHT GAIN IN 24 OR 5 LB WEIGHT GAIN IN 5 DAYS (Patient taking differently: Take 20 mg by mouth daily. MAY TAKE AN EXTRA 20 MG AS NEEDED FOR 2 LB WEIGHT GAIN IN 24 OR 5 LB WEIGHT GAIN IN 5 DAYS Taking PRN) 90 tablet 3   levothyroxine  (SYNTHROID ) 88 MCG tablet Take 88 mcg by mouth daily.     meloxicam (MOBIC) 7.5 MG tablet Take 7.5 mg by mouth daily.     metoprolol  succinate (TOPROL -XL) 50 MG 24 hr tablet Take 1 tablet (50 mg total) by mouth daily. Take with or immediately following a meal. 90 tablet 1   nystatin  cream (MYCOSTATIN ) Apply topically 2 (two) times daily.     ondansetron  (ZOFRAN ) 4 MG tablet Take 4 mg by mouth every 6 (six) hours.     OZEMPIC, 2 MG/DOSE, 8 MG/3ML SOPN Inject into the skin.     rosuvastatin  (CRESTOR ) 20 MG tablet Take 1 tablet (20 mg total) by mouth daily. 90 tablet 3   TRELEGY ELLIPTA 100-62.5-25 MCG/INH AEPB Take 1 puff by mouth daily.     triamcinolone cream (KENALOG) 0.1 % Apply 1 application topically 2 (two) times daily. to affected area     ferrous sulfate 325 (65 FE) MG  tablet Take 325 mg by mouth daily with breakfast. (Patient not taking: Reported on 09/18/2023)     losartan  (COZAAR ) 50 MG tablet Take 1 tablet (50 mg total) by mouth daily. 90 tablet 3   nitroGLYCERIN  (NITROSTAT ) 0.4 MG SL tablet Place 1 tablet (0.4 mg total) under the tongue every 5 (five) minutes as needed for chest pain. 25 tablet 3   No current facility-administered medications for this visit.    OBJECTIVE: Vitals:   09/18/23 1404 09/18/23 1405  BP: (!) 153/70 (!) 171/64  Pulse:  73  Resp:  16  Temp:  98.3 F (36.8 C)  SpO2:  100%     Body mass index is 27.17 kg/m.    ECOG FS:0 - Asymptomatic  General: Well-developed, well-nourished, no acute distress. Eyes: Pink conjunctiva, anicteric sclera. HEENT: Normocephalic, moist mucous membranes. Lungs: No audible wheezing or coughing. Heart: Regular rate and rhythm. Abdomen: Soft, nontender, no obvious distention. Musculoskeletal: No edema, cyanosis, or clubbing. Neuro: Alert, answering all questions appropriately. Cranial nerves grossly intact. Skin: No rashes or petechiae noted. Psych: Normal affect.  LAB RESULTS:  Lab Results  Component Value Date   NA 142 07/03/2022   K 4.4 07/03/2022   CL 104 07/03/2022   CO2 21 07/03/2022   GLUCOSE 110 (H) 07/03/2022   BUN 37 (H) 07/03/2022   CREATININE 1.11 (H) 07/03/2022   CALCIUM  9.3 07/03/2022   PROT 7.7 03/10/2021   ALBUMIN 4.0 03/10/2021   AST 26 03/10/2021   ALT 16 03/10/2021   ALKPHOS 70 03/10/2021   BILITOT 0.7 03/10/2021   GFRNONAA >60 03/10/2021   GFRAA 77 10/02/2019    Lab Results  Component Value Date   WBC 8.0 08/28/2023   NEUTROABS 6.7 03/10/2021   HGB 9.8 (L) 08/28/2023   HCT 31.6 (L) 08/28/2023   MCV 100.0 08/28/2023   PLT 278 08/28/2023     STUDIES: No results found.  ASSESSMENT: Anemia, unspecified. Elevated ferritin.  PLAN:    Anemia unspecified: Patient's hemoglobin is mildly decreased at 9.8.  Previously, all of her other laboratory work  was either negative or within normal limits.  [employer] myeloid panel is pending at time of dictation. No intervention is needed.  Patient does not require bone marrow biopsy.  Return to clinic in 4 months with repeat laboratory work and further evaluation.   Elevated ferritin: Resolved. Hypertension: Patient's blood pressure is moderately elevated today.  Continue monitoring and treatment per primary care.    Patient expressed understanding and was in agreement with this plan. She also understands that She can call clinic at any time with any questions, concerns, or complaints.    Shellie Dials, MD   09/18/2023 2:56 PM

## 2023-09-19 LAB — INTELLIGEN MYELOID

## 2023-09-25 ENCOUNTER — Ambulatory Visit (HOSPITAL_BASED_OUTPATIENT_CLINIC_OR_DEPARTMENT_OTHER): Payer: PPO | Admitting: Family

## 2023-09-25 ENCOUNTER — Encounter (HOSPITAL_BASED_OUTPATIENT_CLINIC_OR_DEPARTMENT_OTHER): Payer: Self-pay | Admitting: Family

## 2023-09-25 VITALS — BP 142/74 | HR 74 | Ht 59.0 in | Wt 133.1 lb

## 2023-09-25 DIAGNOSIS — I25118 Atherosclerotic heart disease of native coronary artery with other forms of angina pectoris: Secondary | ICD-10-CM | POA: Diagnosis not present

## 2023-09-25 DIAGNOSIS — E785 Hyperlipidemia, unspecified: Secondary | ICD-10-CM

## 2023-09-25 DIAGNOSIS — I10 Essential (primary) hypertension: Secondary | ICD-10-CM

## 2023-09-25 DIAGNOSIS — I5022 Chronic systolic (congestive) heart failure: Secondary | ICD-10-CM

## 2023-09-25 NOTE — Patient Instructions (Addendum)
 Medication Instructions:  Your physician recommends that you continue on your current medications as directed. Please refer to the Current Medication list given to you today.   *If you need a refill on your cardiac medications before your next appointment, please call your pharmacy*  Testing/Procedures: Your physician has requested that you have an echocardiogram. Echocardiography is a painless test that uses sound waves to create images of your heart. It provides your doctor with information about the size and shape of your heart and how well your heart's chambers and valves are working. This procedure takes approximately one hour. There are no restrictions for this procedure. Please do NOT wear cologne, perfume, aftershave, or lotions (deodorant is allowed). Please arrive 15 minutes prior to your appointment time.  Please note: We ask at that you not bring children with you during ultrasound (echo/ vascular) testing. Due to room size and safety concerns, children are not allowed in the ultrasound rooms during exams. Our front office staff cannot provide observation of children in our lobby area while testing is being conducted. An adult accompanying a patient to their appointment will only be allowed in the ultrasound room at the discretion of the ultrasound technician under special circumstances. We apologize for any inconvenience.   Follow-Up: At [facility], you and your health needs are our priority.  As part of our continuing mission to provide you with exceptional heart care, our providers are all part of one team.  This team includes your primary Cardiologist (physician) and Advanced Practice Providers or APPs (Physician Assistants and Nurse Practitioners) who all work together to provide you with the care you need, when you need it.  Your next appointment:   4 month(s)  Provider:   Neomi Banks, NP    We recommend signing up for the patient portal called "MyChart".  Sign up  information is provided on this After Visit Summary.  MyChart is used to connect with patients for Virtual Visits (Telemedicine).  Patients are able to view lab/test results, encounter notes, upcoming appointments, etc.  Non-urgent messages can be sent to your provider as well.   To learn more about what you can do with MyChart, go to [email or web address].

## 2023-09-25 NOTE — Progress Notes (Signed)
 Cardiology Office Note:  .   Date:  09/25/2023  ID:  Kathryn Garrett, DOB 02/14/1946, MRN [id number] PCP: Marce Sensing, NP  [facility] Providers Cardiologist:  Avery Bodo, MD Cardiology APP:  Clearnce Curia, NP    History of Present Illness: .   Kathryn Garrett is a 78 y.o. female with a hx of CAD s/p DES to RCA, hypertension, hyperlipidemia, COPD, HFrEF/ICM  Her CAD dates back to inferior MI treated with bare metal stent to RCA in 2010. In 06/2010 she had DES to proximal to mid RCA for ISR of the BMS. Her LM and LAD disease was moderate and not significant. Admitted 01/2016 for unstable angina with cardiac cath showing ISR of RCA for which she underwent balloon angioplasty with ISR 75% ? 0% and Plavix  was resumed. She had unstable angina again 05/2016 with cardiac cath showing ISR within proximal RCA treated with PTCA with scoring balloon. She had bruit on exam with subsequent carotid doppler showing bilateral ECA stenosis >50% but no significant ICA stenosis.    Started on Levothyroxine  05/2020 due to hypothyroidism. Hospitalized 09/10/20 with acute hypoxic respiratory failure secondary to COPD exacerbation complicated by flash pulmonary edema and NSTEMI. Echo 09/11/20 LVEF 25-30%, indeterminite diastolic parameters, mild MR. [facility] 09/24/20 showing mild to moderate nonobstructive CAD including  20-35% distal LMCA and 50% ostial LAD disease with FFR not hemodynamicaly significant, prox RCA stent with mild-moderat ICR (30-40%), normal pulmonary pressure, cardiac output/index, filling pressures. Hospital course complicated by gram negative bacteremia, evaluated by ID and treated with meropenem . Had SVT while admitted and was discharged on Toprol . Repeat echo 11/21/2020 LVEF 60 to 65%, no R WMA, mild LVH, grade 1 diastolic dysfunction, RV normal size and function, normal PASP, trivial MR.     Seen 06/07/21. Crestor  was resumed at lower dose of 20mg  QD due to reported myalgias.    Admitted 9/5-01/19/22 with acute heart failure, diuresed with IV lasix . Noted home noncompliance with Lasix .  Discharged on lasix  40mg  QD, Losartan  50mg  QD, Metoprolol  succiante 50mg .  Lasix  later reduced to 20 mg every other day due to elevated creatinine   Seen 02/2022 noting palpitations.  Subsequent ZIO monitor predominantly normal sinus rhythm with 7 short runs of SVT which were not triggered episodes.    Admitted 2/12 - 06/28/2022 to [facility] after presenting with shortness of breath.Initial troponin 229 and BNP 354.  EKG no acute changes.  She was admitted for CHF.  She was discharged on Lasix  40 mg.  ED visit 06/24/23 for fall 3 days prior when sleeping on the sofa and rolled off striking her face on the ground. CT head small right frontal scald hematoma, cervical spine unremarkable.   Last seen 06/29/2023.  BP was well-controlled.  Rare palpitations not bothersome, Toprol  50 mg daily was continued.  She reported no anginal symptoms.  She was euvolemic on exam.  She was not taking her thyroid medication routinely which was held be contributory to her fatigue and she was encouraged to discuss with her primary care provider.  Presents today for follow up. Reports feeling persistently fatigued. Has resumed her thyroid medication. Reports feeling progressively better as the day goes on. Reports she is sleeping well. BP at home has been 120-124. Weight loss of 4 pounds since last visit 3 months. Tolerating Mounjaro well. Has returned to her part time job. NO chest pain, pressure, tightness. Isolated episode of LLE swelling last week which improved with Lasix  20mg . She is taking  twice per week. Notes her daughter Kathryn Garrett has been diagnosed with breast cancer and is going through chemotherapy and already seen some decrease in cancer size.   ROS: Please see the history of present illness.    All other systems reviewed and are negative.   Studies Reviewed: .           Risk  Assessment/Calculations:            Physical Exam:   VS:  BP (!) 142/74   Pulse 74   Ht 4\' 11"  (1.499 m)   Wt 133 lb 1.6 oz (60.4 kg)   SpO2 98%   BMI 26.88 kg/m    Wt Readings from Last 3 Encounters:  09/25/23 133 lb 1.6 oz (60.4 kg)  09/18/23 134 lb 8 oz (61 kg)  08/28/23 136 lb 6.4 oz (61.9 kg)    GEN: Well nourished, overweight,  well developed in no acute distress NECK: No JVD; No carotid bruits CARDIAC: RRR, no murmurs, rubs, gallops RESPIRATORY:  Clear to auscultation without rales, wheezing or rhonchi  ABDOMEN: Soft, non-tender, non-distended EXTREMITIES:  No edema; No deformity   ASSESSMENT AND PLAN: .    Fatigue - Ongoing. Worse in morning and improves throughout the day though notes sleeping well. Plan for echocardiogram to reassess LVEF. Per her report thyroid and other labs with PCP unremarkable (08/2023 vitamin D 44, TSH 1.94,  Follows with Dr. Adrian Alba for anemia with 09/07/23 Hb  10.3   SVT / Sinus tachycardia - Monitor 03/2022 with 7 brief episodes of SVT which were not triggered episodes.  No indication for further workup at this time.  Continue present dose metoprolol  succinate 50mg  daily.  Rare palpitations are overall not bothersome.    HTN - BP well controlled with home readings 120-124. Continue current antihypertensive regimen.  Lasix  20 milligrams PRM, losartan  50 mg daily, Toprol  50 mg daily.   CAD s/p DES to RCA / HLD, LDL goal <70 - RCA initially with BMS with ISR requiring DES and subsequent balloon angioplasty. LHC 09/2020 mild to moderate nonobstructive disease of distal LMCA and LAD with patent prox RCA stent mild to moderate ISR (30-40%).Stable with no anginal symptoms. No indication for ischemic evaluation.  GDMT aspirin , Toprol  succinate 50 mg daily, Crestor  20 mg daily.  08/2023 LDL 52.   Chronic systolic and diastolic heart failure with recovered LVEF./ Ischemic cardiomyopathy -Jardiance  previously discontinued due to UTI.  GDMT includes Toprol  50 mg  daily, losartan  50 mg daily, Lasix  20 mg PRN (taking twice per week). Euvolemic and well compensated on exam. Low sodium diet, fluid restriction <2L, and daily weights encouraged. Educated to contact our office for weight gain of 2 lbs overnight or 5 lbs in one week. Due to persistent fatigue, update echo to rule out recurrent reduction in LVEF.  Hypothyroidism -  Continue to follow with PCP.           Dispo: follow up in 4 months  Signed, Clearnce Curia, NP

## 2023-10-09 ENCOUNTER — Ambulatory Visit
Admission: RE | Admit: 2023-10-09 | Discharge: 2023-10-09 | Disposition: A | Discharge status: Home / Self Care | Source: Ambulatory Visit | Attending: Student | Admitting: Student

## 2023-10-09 DIAGNOSIS — I2510 Atherosclerotic heart disease of native coronary artery without angina pectoris: Secondary | ICD-10-CM | POA: Insufficient documentation

## 2023-10-09 DIAGNOSIS — Z122 Encounter for screening for malignant neoplasm of respiratory organs: Secondary | ICD-10-CM | POA: Diagnosis not present

## 2023-10-09 DIAGNOSIS — J439 Emphysema, unspecified: Secondary | ICD-10-CM | POA: Insufficient documentation

## 2023-10-09 DIAGNOSIS — I700 Atherosclerosis of aorta: Secondary | ICD-10-CM | POA: Diagnosis not present

## 2023-10-09 DIAGNOSIS — Z87891 Personal history of nicotine dependence: Secondary | ICD-10-CM | POA: Diagnosis present

## 2023-10-09 DIAGNOSIS — D1779 Benign lipomatous neoplasm of other sites: Secondary | ICD-10-CM | POA: Insufficient documentation

## 2023-10-12 ENCOUNTER — Inpatient Hospital Stay
Admission: EM | Admit: 2023-10-12 | Discharge: 2023-10-16 | DRG: 871 | Disposition: A | Discharge status: Home Health | Attending: Student in an Organized Health Care Education/Training Program | Admitting: Student in an Organized Health Care Education/Training Program

## 2023-10-12 ENCOUNTER — Emergency Department

## 2023-10-12 ENCOUNTER — Encounter: Payer: Self-pay | Admitting: Emergency Medicine

## 2023-10-12 DIAGNOSIS — R0902 Hypoxemia: Secondary | ICD-10-CM

## 2023-10-12 DIAGNOSIS — Z79891 Long term (current) use of opiate analgesic: Secondary | ICD-10-CM

## 2023-10-12 DIAGNOSIS — Z7951 Long term (current) use of inhaled steroids: Secondary | ICD-10-CM

## 2023-10-12 DIAGNOSIS — L270 Generalized skin eruption due to drugs and medicaments taken internally: Secondary | ICD-10-CM | POA: Diagnosis not present

## 2023-10-12 DIAGNOSIS — Z8674 Personal history of sudden cardiac arrest: Secondary | ICD-10-CM

## 2023-10-12 DIAGNOSIS — Z888 Allergy status to other drugs, medicaments and biological substances status: Secondary | ICD-10-CM

## 2023-10-12 DIAGNOSIS — Z79890 Hormone replacement therapy (postmenopausal): Secondary | ICD-10-CM

## 2023-10-12 DIAGNOSIS — Z7902 Long term (current) use of antithrombotics/antiplatelets: Secondary | ICD-10-CM

## 2023-10-12 DIAGNOSIS — Z83438 Family history of other disorder of lipoprotein metabolism and other lipidemia: Secondary | ICD-10-CM

## 2023-10-12 DIAGNOSIS — Z7985 Long-term (current) use of injectable non-insulin antidiabetic drugs: Secondary | ICD-10-CM

## 2023-10-12 DIAGNOSIS — E669 Obesity, unspecified: Secondary | ICD-10-CM | POA: Diagnosis present

## 2023-10-12 DIAGNOSIS — I110 Hypertensive heart disease with heart failure: Secondary | ICD-10-CM | POA: Diagnosis present

## 2023-10-12 DIAGNOSIS — N390 Urinary tract infection, site not specified: Secondary | ICD-10-CM | POA: Diagnosis present

## 2023-10-12 DIAGNOSIS — Z791 Long term (current) use of non-steroidal anti-inflammatories (NSAID): Secondary | ICD-10-CM

## 2023-10-12 DIAGNOSIS — J440 Chronic obstructive pulmonary disease with acute lower respiratory infection: Secondary | ICD-10-CM | POA: Diagnosis present

## 2023-10-12 DIAGNOSIS — I2510 Atherosclerotic heart disease of native coronary artery without angina pectoris: Secondary | ICD-10-CM | POA: Insufficient documentation

## 2023-10-12 DIAGNOSIS — Z6822 Body mass index (BMI) 22.0-22.9, adult: Secondary | ICD-10-CM

## 2023-10-12 DIAGNOSIS — R509 Fever, unspecified: Principal | ICD-10-CM

## 2023-10-12 DIAGNOSIS — R21 Rash and other nonspecific skin eruption: Secondary | ICD-10-CM

## 2023-10-12 DIAGNOSIS — E7800 Pure hypercholesterolemia, unspecified: Secondary | ICD-10-CM | POA: Diagnosis present

## 2023-10-12 DIAGNOSIS — Z8249 Family history of ischemic heart disease and other diseases of the circulatory system: Secondary | ICD-10-CM

## 2023-10-12 DIAGNOSIS — M159 Polyosteoarthritis, unspecified: Secondary | ICD-10-CM | POA: Diagnosis present

## 2023-10-12 DIAGNOSIS — Z87891 Personal history of nicotine dependence: Secondary | ICD-10-CM

## 2023-10-12 DIAGNOSIS — R4182 Altered mental status, unspecified: Secondary | ICD-10-CM

## 2023-10-12 DIAGNOSIS — A419 Sepsis, unspecified organism: Secondary | ICD-10-CM | POA: Diagnosis not present

## 2023-10-12 DIAGNOSIS — Z1152 Encounter for screening for COVID-19: Secondary | ICD-10-CM

## 2023-10-12 DIAGNOSIS — I5042 Chronic combined systolic (congestive) and diastolic (congestive) heart failure: Secondary | ICD-10-CM | POA: Diagnosis present

## 2023-10-12 DIAGNOSIS — J189 Pneumonia, unspecified organism: Secondary | ICD-10-CM

## 2023-10-12 DIAGNOSIS — Z7982 Long term (current) use of aspirin: Secondary | ICD-10-CM

## 2023-10-12 DIAGNOSIS — I739 Peripheral vascular disease, unspecified: Secondary | ICD-10-CM | POA: Diagnosis present

## 2023-10-12 DIAGNOSIS — Z881 Allergy status to other antibiotic agents status: Secondary | ICD-10-CM

## 2023-10-12 DIAGNOSIS — I5020 Unspecified systolic (congestive) heart failure: Secondary | ICD-10-CM | POA: Diagnosis present

## 2023-10-12 DIAGNOSIS — I10 Essential (primary) hypertension: Secondary | ICD-10-CM | POA: Insufficient documentation

## 2023-10-12 DIAGNOSIS — Z90710 Acquired absence of both cervix and uterus: Secondary | ICD-10-CM

## 2023-10-12 DIAGNOSIS — E039 Hypothyroidism, unspecified: Secondary | ICD-10-CM | POA: Insufficient documentation

## 2023-10-12 DIAGNOSIS — Z955 Presence of coronary angioplasty implant and graft: Secondary | ICD-10-CM

## 2023-10-12 DIAGNOSIS — I252 Old myocardial infarction: Secondary | ICD-10-CM

## 2023-10-12 LAB — CBC WITH DIFFERENTIAL/PLATELET
Abs Immature Granulocytes: 0.06 10*3/uL (ref 0.00–0.07)
Basophils Absolute: 0.1 10*3/uL (ref 0.0–0.1)
Basophils Relative: 1 %
Eosinophils Absolute: 0.2 10*3/uL (ref 0.0–0.5)
Eosinophils Relative: 2 %
HCT: 33.5 % — ABNORMAL LOW (ref 36.0–46.0)
Hemoglobin: 10.9 g/dL — ABNORMAL LOW (ref 12.0–15.0)
Immature Granulocytes: 1 %
Lymphocytes Relative: 7 %
Lymphs Abs: 0.8 10*3/uL (ref 0.7–4.0)
MCH: 31.1 pg (ref 26.0–34.0)
MCHC: 32.5 g/dL (ref 30.0–36.0)
MCV: 95.7 fL (ref 80.0–100.0)
Monocytes Absolute: 0.8 10*3/uL (ref 0.1–1.0)
Monocytes Relative: 8 %
Neutro Abs: 8.8 10*3/uL — ABNORMAL HIGH (ref 1.7–7.7)
Neutrophils Relative %: 81 %
Platelets: 283 10*3/uL (ref 150–400)
RBC: 3.5 MIL/uL — ABNORMAL LOW (ref 3.87–5.11)
RDW: 12.9 % (ref 11.5–15.5)
WBC: 10.6 10*3/uL — ABNORMAL HIGH (ref 4.0–10.5)
nRBC: 0 % (ref 0.0–0.2)

## 2023-10-12 LAB — COMPREHENSIVE METABOLIC PANEL WITH GFR
ALT: 13 U/L (ref 0–44)
AST: 18 U/L (ref 15–41)
Albumin: 3.8 g/dL (ref 3.5–5.0)
Alkaline Phosphatase: 59 U/L (ref 38–126)
Anion gap: 9 (ref 5–15)
BUN: 20 mg/dL (ref 8–23)
CO2: 24 mmol/L (ref 22–32)
Calcium: 8.8 mg/dL — ABNORMAL LOW (ref 8.9–10.3)
Chloride: 104 mmol/L (ref 98–111)
Creatinine, Ser: 0.8 mg/dL (ref 0.44–1.00)
GFR, Estimated: >60 mL/min (ref >60)
Glucose, Bld: 104 mg/dL — ABNORMAL HIGH (ref 70–99)
Potassium: 4.1 mmol/L (ref 3.5–5.1)
Sodium: 137 mmol/L (ref 135–145)
Total Bilirubin: 0.5 mg/dL (ref 0.0–1.2)
Total Protein: 7 g/dL (ref 6.5–8.1)

## 2023-10-12 LAB — LIPASE, BLOOD: Lipase: 26 U/L (ref 11–51)

## 2023-10-12 LAB — TROPONIN I (HIGH SENSITIVITY): Troponin I (High Sensitivity): 10 ng/L (ref <18)

## 2023-10-12 LAB — LACTIC ACID, PLASMA: Lactic Acid, Venous: 1.2 mmol/L (ref 0.5–1.9)

## 2023-10-12 LAB — BRAIN NATRIURETIC PEPTIDE: B Natriuretic Peptide: 205.5 pg/mL — ABNORMAL HIGH (ref 0.0–100.0)

## 2023-10-12 MED ORDER — LACTATED RINGERS IV BOLUS (SEPSIS)
1000.0000 mL | Freq: Once | INTRAVENOUS | Status: AC
  Administered 2023-10-12: 1000 mL via INTRAVENOUS

## 2023-10-12 MED ORDER — METRONIDAZOLE 500 MG/100ML IV SOLN
500.0000 mg | Freq: Once | INTRAVENOUS | Status: AC
  Administered 2023-10-13: 500 mg via INTRAVENOUS
  Filled 2023-10-12: qty 100

## 2023-10-12 MED ORDER — SODIUM CHLORIDE 0.9 % IV SOLN
2.0000 g | Freq: Once | INTRAVENOUS | Status: AC
  Administered 2023-10-12: 2 g via INTRAVENOUS
  Filled 2023-10-12: qty 12.5

## 2023-10-12 MED ORDER — ACETAMINOPHEN 325 MG RE SUPP
975.0000 mg | Freq: Once | RECTAL | Status: AC
  Administered 2023-10-13: 975 mg via RECTAL
  Filled 2023-10-12: qty 3

## 2023-10-12 MED ORDER — LACTATED RINGERS IV BOLUS (SEPSIS)
250.0000 mL | Freq: Once | INTRAVENOUS | Status: AC
  Administered 2023-10-13: 250 mL via INTRAVENOUS

## 2023-10-12 MED ORDER — LACTATED RINGERS IV BOLUS (SEPSIS)
500.0000 mL | Freq: Once | INTRAVENOUS | Status: AC
  Administered 2023-10-13: 500 mL via INTRAVENOUS

## 2023-10-12 MED ORDER — VANCOMYCIN HCL IN DEXTROSE 1-5 GM/200ML-% IV SOLN
1000.0000 mg | Freq: Once | INTRAVENOUS | Status: AC
  Administered 2023-10-13: 1000 mg via INTRAVENOUS
  Filled 2023-10-12: qty 200

## 2023-10-12 NOTE — Progress Notes (Signed)
 CODE SEPSIS - PHARMACY COMMUNICATION  **Broad Spectrum Antibiotics should be administered within 1 hour of Sepsis diagnosis**  Time Code Sepsis Called/Page Received: 2259  Antibiotics Ordered: Cefepime , Flagyl , Vancomycin   Time of 1st antibiotic administration: 2322  Coretta Dexter, PharmD, [facility] 10/12/2023 11:12 PM

## 2023-10-12 NOTE — Sepsis Progress Note (Signed)
 Elink monitoring for the code sepsis protocol.

## 2023-10-12 NOTE — ED Triage Notes (Signed)
 Patient arrives via [facility] from home for altered mental status. EMS originally called out for left arm numbness - unknown LKW. Patient also experiencing palpitations. Ems reports patient was unable to stay awake and was losing consciousness while talking. Rash on stomach appeared during ems assessment.    187/89, HR 110, SPO2 945  CBG 145

## 2023-10-12 NOTE — ED Provider Notes (Signed)
 [facility] Provider Note    Event Date/Time   First MD Initiated Contact with Patient 10/12/23 2257     (approximate)   History   Altered Mental Status   HPI  Level V caveat: Limited by altered mentation  Kathryn Garrett is a 78 y.o. female brought to the ED via EMS from home with a chief complaint of altered mental status.  EMS reports patient with decreased LOC.  Grandson states patient currently taking an antibiotic for recent diagnosis of UTI.  Patient denies headache, neck pain, vision changes, chest pain, abdominal pain, nausea or vomiting.  Endorses cough and mild shortness of breath.     Past Medical History   Past Medical History:  Diagnosis Date   Anemia, mild    Arthritis    "qwhere" (02/10/2016)   CAD (coronary artery disease)    a. VF arrest 01/2009/CAD with inferoposterior MI s/p aspiration thrombectomy/BMS of RCA at that time. b. ISR of BMS s/p DES to RCA 06/2010 with moderate LM/LAD disease not significant by FFR. c. s/p angiosculpt PTCA to prox RCA 01/2016 for ISR. d. Aggressive PTCA to prox RCA for ISR 05/2016.   Cardiac arrest (HCC) 01/2009   a. in setting of inf-post STEMI 01/2009 (VF).   Chest pain 06/04/2016   Chronic bronchitis (HCC)    Chronic combined systolic and diastolic CHF (congestive heart failure) (HCC)    a. EF 40-45% by cath 2010. b. 60-65% with grade 1 DD by echo 09/2015   COPD (chronic obstructive pulmonary disease) (HCC)    Hyperlipidemia 01/28/2016   Hyperlipidemia LDL goal <70 01/28/2016   Hypertension    Lung mass    MI (myocardial infarction) (HCC) 01/2009   Narcotic abuse (HCC)    pt now taking Suboxone  tid   Pleural effusion on left 09/24/2015   Pneumonia 06/2014; 09/2015   Pre-diabetes    Pulmonary nodule    PVD (peripheral vascular disease) (HCC)    mild atherosclerosis of infrarenal aorta, 25% ostial left renal artery stenosis, 50% ostial right common iliac by cath 2010   S/P PTCA (percutaneous  transluminal coronary angioplasty) 02/10/16 to RCA lesion for in stent restenois 02/11/2016   Subclavian artery stenosis (HCC)    a. >50% by duplex 05/2016.   Tobacco abuse    Unstable angina [facility])      Active Problem List   Patient Active Problem List   Diagnosis Date Noted   SVT (supraventricular tachycardia) (HCC)    Acute combined systolic and diastolic heart failure (HCC)    Pure hypercholesterolemia    Acute on chronic HFrEF (heart failure with reduced ejection fraction) (HCC)    Acute respiratory failure (HCC) 09/11/2020   HFrEF (heart failure with reduced ejection fraction) (HCC)    Acute exacerbation of chronic obstructive pulmonary disease (COPD) (HCC) 09/10/2020   Severe sepsis (HCC) 09/10/2020   NSTEMI (non-ST elevated myocardial infarction) (HCC) 09/10/2020   Hyperglycemia 09/10/2020   SOB (shortness of breath) 09/10/2020   Chronic obstructive pulmonary disease (HCC) 03/05/2018   History of opioid abuse (HCC) 03/05/2018   Nocturnal hypoxemia 10/30/2017   Peripheral arterial disease (HCC) 08/30/2016   Chest pain 06/04/2016   S/P PTCA (percutaneous transluminal coronary angioplasty) 02/10/16 to RCA lesion for in stent restenois 02/11/2016   Unstable angina (HCC)    Hyperlipidemia LDL goal <70 01/28/2016   Presence of coronary angioplasty implant and graft 10/15/2015   Tobacco abuse 10/15/2015   CAP (community acquired pneumonia) 09/24/2015   CAD  in native artery 09/24/2015   HTN (hypertension) 09/24/2015   Lung nodule, solitary 03/31/2014   Liver nodule 03/31/2014     Past Surgical History   Past Surgical History:  Procedure Laterality Date   ABDOMINAL HYSTERECTOMY     ANKLE FRACTURE SURGERY Right 2000s   CARDIAC CATHETERIZATION N/A 02/10/2016   Procedure: Left Heart Cath and Coronary Angiography;  Surgeon: Lucendia Rusk, MD;  Location: [facility] INVASIVE CV LAB;  Service: Cardiovascular;  Laterality: N/A;   CARDIAC CATHETERIZATION N/A 02/10/2016   Procedure:  Coronary Balloon Angioplasty;  Surgeon: Lucendia Rusk, MD;  Location: [facility] INVASIVE CV LAB;  Service: Cardiovascular;  Laterality: N/A;  instent RCA   CARDIAC CATHETERIZATION N/A 06/05/2016   Procedure: Left Heart Cath and Coronary Angiography;  Surgeon: Arleen Lacer, MD;  Location: [facility] INVASIVE CV LAB;  Service: Cardiovascular;  Laterality: N/A;   CARDIAC CATHETERIZATION N/A 06/05/2016   Procedure: Coronary Balloon Angioplasty;  Surgeon: Arleen Lacer, MD;  Location: [facility] INVASIVE CV LAB;  Service: Cardiovascular;  Laterality: N/A;   CHEST TUBE INSERTION N/A 10/08/2015   Procedure: PLEURX CATH REMOVAL;  Surgeon: Petra Brandy, MD;  Location: [facility] ORS;  Service: Thoracic;  Laterality: N/A;   CORONARY ANGIOPLASTY WITH STENT PLACEMENT  01/2009; 2012   FRACTURE SURGERY     RIGHT/LEFT HEART CATH AND CORONARY ANGIOGRAPHY N/A 09/24/2020   Procedure: RIGHT/LEFT HEART CATH AND CORONARY ANGIOGRAPHY;  Surgeon: Sammy Crisp, MD;  Location: [facility] INVASIVE CV LAB;  Service: Cardiovascular;  Laterality: N/A;   VIDEO ASSISTED THORACOSCOPY (VATS)/THOROCOTOMY Left 09/29/2015   Procedure: PREOP BRONCHOSCOPY, LEFT THORACOSCOPY, POSSIBLE THORACOTOMY, PLEURAL BIOPSY, TALC ;  Surgeon: Petra Brandy, MD;  Location: [facility] ORS;  Service: General;  Laterality: Left;     Home Medications   Prior to Admission medications   Medication Sig Start Date End Date Taking? Authorizing Provider  acetaminophen  (TYLENOL ) 500 MG tablet Take 1,000 mg by mouth every 6 (six) hours as needed for headache.    [provider]  albuterol  (PROVENTIL  HFA;VENTOLIN  HFA) 108 (90 Base) MCG/ACT inhaler Inhale 2 puffs into the lungs every 4 (four) hours as needed for wheezing or shortness of breath.    [provider]  aspirin  EC 81 MG tablet Take 81 mg by mouth at bedtime.     [provider]  azelastine (ASTELIN) 0.1 % nasal spray Place 2 sprays into both nostrils daily. 08/14/17   [provider]  Buprenorphine   HCl-Naloxone  HCl 8-2 MG FILM Place 0.5 Film under the tongue daily.    [provider]  cetirizine  (ZYRTEC ) 10 MG tablet Take 1 tablet (10 mg total) by mouth daily. 03/11/18   Jerrye Mori, PA-C  clopidogrel  (PLAVIX ) 75 MG tablet Take 1 tablet (75 mg total) by mouth daily. 02/13/22   Clearnce Curia, NP  furosemide  (LASIX ) 20 MG tablet Take 1 tablet (20 mg total) by mouth daily. MAY TAKE AN EXTRA 20 MG AS NEEDED FOR 2 LB WEIGHT GAIN IN 24 OR 5 LB WEIGHT GAIN IN 5 DAYS Patient taking differently: Take 20 mg by mouth daily. MAY TAKE AN EXTRA 20 MG AS NEEDED FOR 2 LB WEIGHT GAIN IN 24 OR 5 LB WEIGHT GAIN IN 5 DAYS Taking PRN 07/03/22   Walker, Caitlin S, NP  levothyroxine  (SYNTHROID ) 88 MCG tablet Take 88 mcg by mouth daily. 05/26/20   [provider]  losartan  (COZAAR ) 50 MG tablet Take 1 tablet (50 mg total) by mouth daily. 02/14/22 02/09/23  Walker, Caitlin S, NP  meloxicam (MOBIC) 7.5 MG tablet Take 7.5 mg by mouth daily. 07/26/22   [provider]  metoprolol  succinate (TOPROL -XL) 50 MG 24 hr tablet Take 1 tablet (50 mg total) by mouth daily. Take with or immediately following a meal. 03/09/23   Clearnce Curia, NP  nitroGLYCERIN  (NITROSTAT ) 0.4 MG SL tablet Place 1 tablet (0.4 mg total) under the tongue every 5 (five) minutes as needed for chest pain. 11/02/20 03/10/21  Walker, Caitlin S, NP  nystatin  cream (MYCOSTATIN ) Apply topically 2 (two) times daily. 07/06/22   [provider]  ondansetron  (ZOFRAN ) 4 MG tablet Take 4 mg by mouth every 6 (six) hours. 08/21/22   [provider]  rosuvastatin  (CRESTOR ) 20 MG tablet Take 1 tablet (20 mg total) by mouth daily. 08/09/23   Walker, Caitlin S, NP  tirzepatide [facility]) 5 MG/0.5ML Pen Inject 5 mg into the skin once a week.    [provider]  TRELEGY ELLIPTA 100-62.5-25 MCG/INH AEPB Take 1 puff by mouth daily. 08/13/20   [provider]  triamcinolone cream (KENALOG) 0.1 % Apply 1 application  topically 2 (two) times daily. to affected area 08/11/20   [provider]     Allergies  Pregabalin and Azithromycin    Family History   Family History  Problem Relation Age of Onset   Coronary artery disease Father    Hyperlipidemia Father    Early death Father    Heart attack Father    Coronary artery disease Mother    Hyperlipidemia Mother    Heart attack Mother    Cancer Brother      Physical Exam  Triage Vital Signs: ED Triage Vitals  Encounter Vitals Group     BP 10/12/23 2245 (!) 151/62     Systolic BP Percentile --      Diastolic BP Percentile --      Pulse Rate 10/12/23 2245 (!) 115     Resp 10/12/23 2245 15     Temp 10/12/23 2246 (!) 102 F (38.9 C)     Temp Source 10/12/23 2246 Oral     SpO2 10/12/23 2245 (!) 88 %     Weight 10/12/23 2246 110 lb 14.3 oz (50.3 kg)     Height 10/12/23 2246 4\' 11"  (1.499 m)     Head Circumference --      Peak Flow --      Pain Score 10/12/23 2246 0     Pain Loc --      Pain Education --      Exclude from Growth Chart --     Updated Vital Signs: BP (!) 142/80   Pulse (!) 103   Temp (!) 102 F (38.9 C) (Oral)   Resp 15   Ht 4\' 11"  (1.499 m)   Wt 50.3 kg   SpO2 100%   BMI 22.40 kg/m    General: Awake, mild distress.  CV:  Tachycardic.  Good peripheral perfusion.  Resp:  Increased effort.  Scattered rhonchi. Abd:  Nontender.  No distention.  Other:  Maculopapular rash noted to trunk only.  No vesicles.  No petechiae.  No tongue/lip angioedema.  Posterior oropharynx is clear.  No hoarse or muffled voice.  Tolerating secretions well.  No neck swelling.  Alert and oriented to person and place.  CN II-XII grossly intact.  5/5 motor strength and sensation all extremities.  Supple neck without meningismus.   ED Results / Procedures / Treatments  Labs (all labs ordered are listed, but only abnormal  results are displayed) Labs Reviewed  CBC WITH DIFFERENTIAL/PLATELET - Abnormal; Notable for the following  components:      Result Value   WBC 10.6 (*)    RBC 3.50 (*)    Hemoglobin 10.9 (*)    HCT 33.5 (*)    Neutro Abs 8.8 (*)    All other components within normal limits  COMPREHENSIVE METABOLIC PANEL WITH GFR - Abnormal; Notable for the following components:   Glucose, Bld 104 (*)    Calcium  8.8 (*)    All other components within normal limits  BRAIN NATRIURETIC PEPTIDE - Abnormal; Notable for the following components:   B Natriuretic Peptide 205.5 (*)    All other components within normal limits  URINALYSIS, W/ REFLEX TO CULTURE (INFECTION SUSPECTED) - Abnormal; Notable for the following components:   Color, Urine STRAW (*)    APPearance CLEAR (*)    All other components within normal limits  CULTURE, BLOOD (SINGLE)  CULTURE, BLOOD (ROUTINE X 2)  CULTURE, BLOOD (ROUTINE X 2)  RESP PANEL BY RT-PCR (RSV, FLU A&B, COVID)  [id number]  LACTIC ACID, PLASMA  LIPASE, BLOOD  LACTIC ACID, PLASMA  TROPONIN I (HIGH SENSITIVITY)  TROPONIN I (HIGH SENSITIVITY)     EKG  ED ECG REPORT I, Nayden Czajka J, the attending physician, personally viewed and interpreted this ECG.   Date: 10/13/2023  EKG Time: 2324  Rate: 105  Rhythm: sinus tachycardia  Axis: Normal  Intervals:none  ST&T Change: Nonspecific    RADIOLOGY I have independently visualized interpreted patient's imaging studies as well as noted the radiology interpretation:  CT head: No ICH  Chest x-ray: Mild bilateral patchy infiltrates  Official radiology report(s): DG Chest Port 1 View Result Date: 10/12/2023 CLINICAL DATA:  Hypoxia EXAM: PORTABLE CHEST 1 VIEW COMPARISON:  Chest x-ray 05/10/2023 FINDINGS: There are minimal patchy opacities in the retrocardiac region with central peribronchial wall thickening bilaterally. There is no pleural effusion or pneumothorax. Cardiomediastinal silhouette is within normal limits. No acute fractures are seen. IMPRESSION: Minimal patchy opacities in the retrocardiac region with central  peribronchial wall thickening bilaterally. Findings may be due to bronchitis or developing pneumonia. Electronically Signed   By: Tyron Gallon M.D.   On: 10/12/2023 23:29   CT Head Wo Contrast Result Date: 10/12/2023 EXAM: CT HEAD WITHOUT TECHNIQUE: CT of the head was performed without the administration of intravenous contrast. Automated exposure control, iterative reconstruction, and/or weight based adjustment of the mA/kV was utilized to reduce the radiation dose to as low as reasonably achievable. COMPARISON: CT Head dated 06/24/23. CLINICAL HISTORY: Mental status change, unknown cause. Patient arrives via [facility] from home for altered mental status. EMS originally called out for left arm numbness - unknown LKW. Patient also experiencing palpitations. Ems reports patient was unable to stay awake and was losing consciousness while talking. Rash on stomach appeared during ems assessment. 187/89, HR 110, SPO2 94, CBG 145. FINDINGS: BRAIN AND VENTRICLES: Global cortical atrophy. Subcortical and periventricular small vessel ischemic changes. Intracranial atherosclerosis. ORBITS: No acute abnormality is mentioned. 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 SOFT TISSUES AND SKULL: No acute abnormality of the visualized skull or soft tissues. IMPRESSION: 1. No acute intracranial abnormality. 2. Global cortical atrophy and small vessel ischemic changes. Electronically signed by: Zadie Herter MD 10/12/2023 11:28 PM EDT RP Workstation: [id number]     PROCEDURES:  Critical Care performed: Yes, see critical care procedure note(s)  CRITICAL CARE Performed by: Norlene Beavers   Total critical care  time: 30 minutes  Critical care time was exclusive of separately billable procedures and treating other patients.  Critical care was necessary to treat or prevent imminent or life-threatening deterioration.  Critical care was time spent personally by me on the following activities:  development of treatment plan with patient and/or surrogate as well as nursing, discussions with consultants, evaluation of patient's response to treatment, examination of patient, obtaining history from patient or surrogate, ordering and performing treatments and interventions, ordering and review of laboratory studies, ordering and review of radiographic studies, pulse oximetry and re-evaluation of patient's condition.   Aaron Aas1-3 Lead EKG Interpretation  Performed by: Norlene Beavers, MD Authorized by: Norlene Beavers, MD     Interpretation: abnormal     ECG rate:  115   ECG rate assessment: tachycardic     Rhythm: sinus tachycardia     Ectopy: none     Conduction: normal   Comments:     Patient placed on cardiac monitor to evaluate for arrhythmias    MEDICATIONS ORDERED IN ED: Medications  metroNIDAZOLE  (FLAGYL ) IVPB 500 mg (500 mg Intravenous New Bag/Given 10/13/23 0020)  vancomycin  (VANCOCIN ) IVPB 1000 mg/200 mL premix (has no administration in time range)  lactated ringers  bolus 1,000 mL (0 mLs Intravenous Stopped 10/13/23 0019)    And  lactated ringers  bolus 500 mL (500 mLs Intravenous New Bag/Given 10/13/23 0019)    And  lactated ringers  bolus 250 mL (250 mLs Intravenous New Bag/Given 10/13/23 0019)  ceFEPIme  (MAXIPIME ) 2 g in sodium chloride  0.9 % 100 mL IVPB (0 g Intravenous Stopped 10/13/23 0002)  acetaminophen  (TYLENOL ) suppository 975 mg (975 mg Rectal Given 10/13/23 0001)     IMPRESSION / MDM / ASSESSMENT AND PLAN / ED COURSE  I reviewed the triage vital signs and the nursing notes.                             78 year old female presenting with altered mental status. Differential diagnosis includes, but is not limited to, alcohol, illicit or prescription medications, or other toxic ingestion; intracranial pathology such as stroke or intracerebral hemorrhage; fever or infectious causes including sepsis; hypoxemia and/or hypercarbia; uremia; trauma; endocrine related disorders such as  diabetes, hypoglycemia, and thyroid-related diseases; hypertensive encephalopathy; etc. I personally reviewed patient's records and note a cardiology office visit on 09/25/2023 for follow-up hypertension, CAD, hyperlipidemia, CHF.  I am unable to find a record of her visit for UTI in which antibiotic she is currently taking and neither she nor her family member know that information.  Patient's presentation is most consistent with acute complicated illness / injury requiring diagnostic workup.  The patient is on the cardiac monitor to evaluate for evidence of arrhythmia and/or significant heart rate changes.  Patient meets sepsis criteria on presenting vital signs.  ED code sepsis workup initiated.  Administer rectal Tylenol  for fever.  30 cc/kilo IV lactated Ringer 's, broad-spectrum IV antibiotics.  Anticipate hospitalization.  Clinical Course as of 10/13/23 0044  Sat Oct 13, 2023  0044 Lactic acid 1.2.  UA clear.  Chest x-ray suspicious for pneumonia.  Will consult hospitalist services for evaluation and admission. [JS]    Clinical Course User Index [JS] Norlene Beavers, MD     FINAL CLINICAL IMPRESSION(S) / ED DIAGNOSES   Final diagnoses:  Fever, unspecified fever cause  Sepsis, due to unspecified organism, unspecified whether acute organ dysfunction present (HCC)  Hypoxia  Altered mental status, unspecified altered mental  status type  Community acquired pneumonia, unspecified laterality  Drug rash     Rx / DC Orders   ED Discharge Orders     None        Note:  This document was prepared using Dragon voice recognition software and may include unintentional dictation errors.   Easter Kennebrew J, MD 10/13/23 [phone]

## 2023-10-13 ENCOUNTER — Other Ambulatory Visit: Payer: Self-pay

## 2023-10-13 DIAGNOSIS — Z8674 Personal history of sudden cardiac arrest: Secondary | ICD-10-CM | POA: Diagnosis not present

## 2023-10-13 DIAGNOSIS — M159 Polyosteoarthritis, unspecified: Secondary | ICD-10-CM | POA: Diagnosis present

## 2023-10-13 DIAGNOSIS — I2510 Atherosclerotic heart disease of native coronary artery without angina pectoris: Secondary | ICD-10-CM | POA: Diagnosis present

## 2023-10-13 DIAGNOSIS — Z8249 Family history of ischemic heart disease and other diseases of the circulatory system: Secondary | ICD-10-CM | POA: Diagnosis not present

## 2023-10-13 DIAGNOSIS — Z1152 Encounter for screening for COVID-19: Secondary | ICD-10-CM | POA: Diagnosis not present

## 2023-10-13 DIAGNOSIS — Z90710 Acquired absence of both cervix and uterus: Secondary | ICD-10-CM | POA: Diagnosis not present

## 2023-10-13 DIAGNOSIS — I110 Hypertensive heart disease with heart failure: Secondary | ICD-10-CM | POA: Diagnosis present

## 2023-10-13 DIAGNOSIS — I5020 Unspecified systolic (congestive) heart failure: Secondary | ICD-10-CM | POA: Diagnosis not present

## 2023-10-13 DIAGNOSIS — J440 Chronic obstructive pulmonary disease with acute lower respiratory infection: Secondary | ICD-10-CM | POA: Diagnosis present

## 2023-10-13 DIAGNOSIS — J189 Pneumonia, unspecified organism: Secondary | ICD-10-CM | POA: Diagnosis present

## 2023-10-13 DIAGNOSIS — R0902 Hypoxemia: Secondary | ICD-10-CM | POA: Diagnosis present

## 2023-10-13 DIAGNOSIS — I10 Essential (primary) hypertension: Secondary | ICD-10-CM | POA: Diagnosis not present

## 2023-10-13 DIAGNOSIS — Z7982 Long term (current) use of aspirin: Secondary | ICD-10-CM | POA: Diagnosis not present

## 2023-10-13 DIAGNOSIS — A419 Sepsis, unspecified organism: Secondary | ICD-10-CM | POA: Diagnosis present

## 2023-10-13 DIAGNOSIS — E669 Obesity, unspecified: Secondary | ICD-10-CM | POA: Diagnosis present

## 2023-10-13 DIAGNOSIS — I252 Old myocardial infarction: Secondary | ICD-10-CM | POA: Diagnosis not present

## 2023-10-13 DIAGNOSIS — Z79891 Long term (current) use of opiate analgesic: Secondary | ICD-10-CM | POA: Diagnosis not present

## 2023-10-13 DIAGNOSIS — E039 Hypothyroidism, unspecified: Secondary | ICD-10-CM | POA: Insufficient documentation

## 2023-10-13 DIAGNOSIS — E7800 Pure hypercholesterolemia, unspecified: Secondary | ICD-10-CM | POA: Diagnosis present

## 2023-10-13 DIAGNOSIS — J9621 Acute and chronic respiratory failure with hypoxia: Secondary | ICD-10-CM | POA: Diagnosis not present

## 2023-10-13 DIAGNOSIS — I5042 Chronic combined systolic (congestive) and diastolic (congestive) heart failure: Secondary | ICD-10-CM | POA: Diagnosis present

## 2023-10-13 DIAGNOSIS — R509 Fever, unspecified: Secondary | ICD-10-CM | POA: Diagnosis not present

## 2023-10-13 DIAGNOSIS — N390 Urinary tract infection, site not specified: Secondary | ICD-10-CM | POA: Diagnosis present

## 2023-10-13 DIAGNOSIS — Z955 Presence of coronary angioplasty implant and graft: Secondary | ICD-10-CM | POA: Diagnosis not present

## 2023-10-13 DIAGNOSIS — L270 Generalized skin eruption due to drugs and medicaments taken internally: Secondary | ICD-10-CM | POA: Diagnosis present

## 2023-10-13 DIAGNOSIS — I739 Peripheral vascular disease, unspecified: Secondary | ICD-10-CM | POA: Diagnosis present

## 2023-10-13 DIAGNOSIS — Z7985 Long-term (current) use of injectable non-insulin antidiabetic drugs: Secondary | ICD-10-CM | POA: Diagnosis not present

## 2023-10-13 DIAGNOSIS — Z7902 Long term (current) use of antithrombotics/antiplatelets: Secondary | ICD-10-CM | POA: Diagnosis not present

## 2023-10-13 DIAGNOSIS — Z87891 Personal history of nicotine dependence: Secondary | ICD-10-CM | POA: Diagnosis not present

## 2023-10-13 LAB — CBC
HCT: 27.9 % — ABNORMAL LOW (ref 36.0–46.0)
Hemoglobin: 9.1 g/dL — ABNORMAL LOW (ref 12.0–15.0)
MCH: 31 pg (ref 26.0–34.0)
MCHC: 32.6 g/dL (ref 30.0–36.0)
MCV: 94.9 fL (ref 80.0–100.0)
Platelets: 227 10*3/uL (ref 150–400)
RBC: 2.94 MIL/uL — ABNORMAL LOW (ref 3.87–5.11)
RDW: 12.8 % (ref 11.5–15.5)
WBC: 8.7 10*3/uL (ref 4.0–10.5)
nRBC: 0 % (ref 0.0–0.2)

## 2023-10-13 LAB — URINALYSIS, W/ REFLEX TO CULTURE (INFECTION SUSPECTED)
Bacteria, UA: NONE SEEN
Bilirubin Urine: NEGATIVE
Glucose, UA: NEGATIVE mg/dL
Hgb urine dipstick: NEGATIVE
Ketones, ur: NEGATIVE mg/dL
Leukocytes,Ua: NEGATIVE
Nitrite: NEGATIVE
Protein, ur: NEGATIVE mg/dL
Specific Gravity, Urine: 1.012 (ref 1.005–1.030)
Squamous Epithelial / HPF: 0 /HPF (ref 0–5)
pH: 7 (ref 5.0–8.0)

## 2023-10-13 LAB — BASIC METABOLIC PANEL WITH GFR
Anion gap: 11 (ref 5–15)
BUN: 18 mg/dL (ref 8–23)
CO2: 21 mmol/L — ABNORMAL LOW (ref 22–32)
Calcium: 8.3 mg/dL — ABNORMAL LOW (ref 8.9–10.3)
Chloride: 105 mmol/L (ref 98–111)
Creatinine, Ser: 0.76 mg/dL (ref 0.44–1.00)
GFR, Estimated: >60 mL/min (ref >60)
Glucose, Bld: 113 mg/dL — ABNORMAL HIGH (ref 70–99)
Potassium: 4.1 mmol/L (ref 3.5–5.1)
Sodium: 137 mmol/L (ref 135–145)

## 2023-10-13 LAB — BLOOD GAS, VENOUS
Bicarbonate: 24.8 mmol/L (ref 20.0–28.0)
O2 Saturation: 86 %
Patient temperature: 37
pCO2, Ven: 42 mmHg — ABNORMAL LOW (ref 44–60)
pH, Ven: 7.38 (ref 7.25–7.43)
pO2, Ven: 51 mmHg — ABNORMAL HIGH (ref 32–45)

## 2023-10-13 LAB — CORTISOL-AM, BLOOD: Cortisol - AM: 4.7 ug/dL — ABNORMAL LOW (ref 6.7–22.6)

## 2023-10-13 LAB — RESP PANEL BY RT-PCR (RSV, FLU A&B, COVID)  RVPGX2
Influenza A by PCR: NEGATIVE
Influenza B by PCR: NEGATIVE
Resp Syncytial Virus by PCR: NEGATIVE
SARS Coronavirus 2 by RT PCR: NEGATIVE

## 2023-10-13 LAB — TROPONIN I (HIGH SENSITIVITY): Troponin I (High Sensitivity): 15 ng/L (ref <18)

## 2023-10-13 LAB — PROTIME-INR
INR: 1.3 — ABNORMAL HIGH (ref 0.8–1.2)
Prothrombin Time: 16.7 s — ABNORMAL HIGH (ref 11.4–15.2)

## 2023-10-13 LAB — LACTIC ACID, PLASMA: Lactic Acid, Venous: 1.1 mmol/L (ref 0.5–1.9)

## 2023-10-13 MED ORDER — BENZONATATE 100 MG PO CAPS
100.0000 mg | ORAL_CAPSULE | Freq: Three times a day (TID) | ORAL | Status: DC | PRN
  Administered 2023-10-13: 100 mg via ORAL
  Filled 2023-10-13: qty 1

## 2023-10-13 MED ORDER — BUPRENORPHINE HCL-NALOXONE HCL 8-2 MG SL SUBL
1.0000 | SUBLINGUAL_TABLET | Freq: Every day | SUBLINGUAL | Status: DC
  Administered 2023-10-13 – 2023-10-16 (×4): 1 via SUBLINGUAL
  Filled 2023-10-13 (×4): qty 1

## 2023-10-13 MED ORDER — FUROSEMIDE 20 MG PO TABS: 20.0000 mg | ORAL_TABLET | Freq: Every day | ORAL | Status: DC | PRN

## 2023-10-13 MED ORDER — ONDANSETRON HCL 4 MG/2ML IJ SOLN
4.0000 mg | Freq: Four times a day (QID) | INTRAMUSCULAR | Status: DC | PRN
  Administered 2023-10-14: 4 mg via INTRAVENOUS
  Filled 2023-10-13: qty 2

## 2023-10-13 MED ORDER — NITROGLYCERIN 0.4 MG SL SUBL: 0.4000 mg | SUBLINGUAL_TABLET | SUBLINGUAL | Status: DC | PRN

## 2023-10-13 MED ORDER — ASPIRIN 81 MG PO TBEC
81.0000 mg | DELAYED_RELEASE_TABLET | Freq: Every day | ORAL | Status: DC
  Administered 2023-10-13 – 2023-10-15 (×3): 81 mg via ORAL
  Filled 2023-10-13 (×3): qty 1

## 2023-10-13 MED ORDER — SODIUM CHLORIDE 0.9 % IV SOLN
100.0000 mg | Freq: Two times a day (BID) | INTRAVENOUS | Status: DC
  Administered 2023-10-13: 100 mg via INTRAVENOUS
  Filled 2023-10-13 (×3): qty 100

## 2023-10-13 MED ORDER — ENOXAPARIN SODIUM 40 MG/0.4ML IJ SOSY
40.0000 mg | PREFILLED_SYRINGE | INTRAMUSCULAR | Status: DC
  Administered 2023-10-13 – 2023-10-16 (×4): 40 mg via SUBCUTANEOUS
  Filled 2023-10-13 (×4): qty 0.4

## 2023-10-13 MED ORDER — CLOPIDOGREL BISULFATE 75 MG PO TABS
75.0000 mg | ORAL_TABLET | Freq: Every day | ORAL | Status: DC
  Administered 2023-10-13 – 2023-10-16 (×4): 75 mg via ORAL
  Filled 2023-10-13 (×4): qty 1

## 2023-10-13 MED ORDER — METOPROLOL SUCCINATE ER 50 MG PO TB24
50.0000 mg | ORAL_TABLET | Freq: Every day | ORAL | Status: DC
  Administered 2023-10-13 – 2023-10-16 (×4): 50 mg via ORAL
  Filled 2023-10-13 (×4): qty 1

## 2023-10-13 MED ORDER — LACTATED RINGERS IV SOLN
150.0000 mL/h | INTRAVENOUS | Status: DC
Start: 2023-10-13 — End: 2023-10-13
  Administered 2023-10-13: 150 mL/h via INTRAVENOUS

## 2023-10-13 MED ORDER — GUAIFENESIN ER 600 MG PO TB12
600.0000 mg | ORAL_TABLET | Freq: Two times a day (BID) | ORAL | Status: DC
  Administered 2023-10-13 – 2023-10-16 (×8): 600 mg via ORAL
  Filled 2023-10-13 (×8): qty 1

## 2023-10-13 MED ORDER — DOXYCYCLINE HYCLATE 100 MG PO TABS
100.0000 mg | ORAL_TABLET | Freq: Two times a day (BID) | ORAL | Status: DC
  Administered 2023-10-13 – 2023-10-14 (×4): 100 mg via ORAL
  Filled 2023-10-13 (×4): qty 1

## 2023-10-13 MED ORDER — ONDANSETRON HCL 4 MG PO TABS: 4.0000 mg | ORAL_TABLET | Freq: Four times a day (QID) | ORAL | Status: DC

## 2023-10-13 MED ORDER — TRAZODONE HCL 50 MG PO TABS: 25.0000 mg | ORAL_TABLET | Freq: Every evening | ORAL | Status: DC | PRN

## 2023-10-13 MED ORDER — AZELASTINE HCL 0.1 % NA SOLN: 2.0000 | Freq: Every day | NASAL | Status: DC | PRN

## 2023-10-13 MED ORDER — LEVOTHYROXINE SODIUM 88 MCG PO TABS
88.0000 ug | ORAL_TABLET | Freq: Every day | ORAL | Status: DC
  Administered 2023-10-13 – 2023-10-16 (×4): 88 ug via ORAL
  Filled 2023-10-13 (×4): qty 1

## 2023-10-13 MED ORDER — IPRATROPIUM-ALBUTEROL 0.5-2.5 (3) MG/3ML IN SOLN: 3.0000 mL | RESPIRATORY_TRACT | Status: DC | PRN

## 2023-10-13 MED ORDER — ROSUVASTATIN CALCIUM 10 MG PO TABS
20.0000 mg | ORAL_TABLET | Freq: Every day | ORAL | Status: DC
  Administered 2023-10-13 – 2023-10-16 (×4): 20 mg via ORAL
  Filled 2023-10-13 (×4): qty 2

## 2023-10-13 MED ORDER — SODIUM CHLORIDE 0.9 % IV SOLN
2.0000 g | INTRAVENOUS | Status: DC
  Administered 2023-10-13 – 2023-10-15 (×3): 2 g via INTRAVENOUS
  Filled 2023-10-13 (×5): qty 20

## 2023-10-13 MED ORDER — ONDANSETRON HCL 4 MG PO TABS: 4.0000 mg | ORAL_TABLET | Freq: Four times a day (QID) | ORAL | Status: DC | PRN

## 2023-10-13 MED ORDER — IPRATROPIUM-ALBUTEROL 0.5-2.5 (3) MG/3ML IN SOLN
3.0000 mL | Freq: Three times a day (TID) | RESPIRATORY_TRACT | Status: DC
  Administered 2023-10-13 – 2023-10-15 (×6): 3 mL via RESPIRATORY_TRACT
  Filled 2023-10-13 (×5): qty 3

## 2023-10-13 MED ORDER — ACETAMINOPHEN 650 MG RE SUPP: 650.0000 mg | Freq: Four times a day (QID) | RECTAL | Status: DC | PRN

## 2023-10-13 MED ORDER — IPRATROPIUM-ALBUTEROL 0.5-2.5 (3) MG/3ML IN SOLN
3.0000 mL | Freq: Four times a day (QID) | RESPIRATORY_TRACT | Status: DC
  Administered 2023-10-13: 3 mL via RESPIRATORY_TRACT
  Filled 2023-10-13: qty 3

## 2023-10-13 MED ORDER — MAGNESIUM HYDROXIDE 400 MG/5ML PO SUSP: 30.0000 mL | Freq: Every day | ORAL | Status: DC | PRN

## 2023-10-13 MED ORDER — LOSARTAN POTASSIUM 50 MG PO TABS
50.0000 mg | ORAL_TABLET | Freq: Every day | ORAL | Status: DC
  Administered 2023-10-13 – 2023-10-16 (×4): 50 mg via ORAL
  Filled 2023-10-13 (×4): qty 1

## 2023-10-13 MED ORDER — ACETAMINOPHEN 325 MG PO TABS
650.0000 mg | ORAL_TABLET | Freq: Four times a day (QID) | ORAL | Status: DC | PRN
Start: 2023-10-13 — End: 2023-10-16
  Administered 2023-10-14: 650 mg via ORAL
  Filled 2023-10-13: qty 2

## 2023-10-13 MED ORDER — CETIRIZINE HCL 10 MG PO TABS
10.0000 mg | ORAL_TABLET | Freq: Every day | ORAL | Status: DC
  Administered 2023-10-13 – 2023-10-16 (×4): 10 mg via ORAL
  Filled 2023-10-13 (×6): qty 1

## 2023-10-13 MED ORDER — HYDROCOD POLI-CHLORPHE POLI ER 10-8 MG/5ML PO SUER: 5.0000 mL | Freq: Two times a day (BID) | ORAL | Status: DC | PRN

## 2023-10-13 NOTE — Assessment & Plan Note (Signed)
Will continue antihypertensive therapy.

## 2023-10-13 NOTE — Plan of Care (Signed)

## 2023-10-13 NOTE — H&P (Signed)
 [place]   PATIENT NAME: Kathryn Garrett    MR#:  [id number]  DATE OF BIRTH:  January 15, 1946  DATE OF ADMISSION:  10/12/2023  PRIMARY CARE PHYSICIAN: Marce Sensing, NP   Patient is coming from: Home  REQUESTING/REFERRING PHYSICIAN: Meredith Stalls, MD  CHIEF COMPLAINT:   Chief Complaint  Patient presents with   Altered Mental Status    HISTORY OF PRESENT ILLNESS:  Kathryn Garrett is a 78 y.o. female with medical history significant for osteoarthritis, coronary artery disease, chronic systolic and diastolic CHF, COPD, hypertension, dyslipidemia, who presented to the emergency room with acute onset of malaise over the last couple of days with associated cough that is mostly dry with dyspnea and wheezing.  She has been having tactile fever and chills as well as nausea without vomiting or diarrhea or abdominal pain.  She has been having occasional chest pain with cough.  She has been mildly confused as well.  No dysuria, oliguria or hematuria or flank pain.  The patient was fairly somnolent and poor historian.  ED Course: When she came to the ER, temperature was 102 and blood pressure 142/74 with heart rate 115, respiratory rate of 15 and later 26 and pulse ox  88% on room air and 98% on 2 L of O2 by nasal cannula.  Labs revealed unremarkable CMP.  BNP was two 5.5 and high-sensitivity troponin was 10 and later 15.  Lactic acid was 1.2 later 1.1.  CBC showed WBCs of 10.6 with neutrophilia as well as mild anemia better than previous levels.  INR is 1.3 and PT 16.7.  Respiratory panel came back negative.  UA was unremarkable. EKG as reviewed by me : EKG showed sinus tachycardia with rate 105 with PACs and low voltage QRS. Imaging: Portable chest x-ray showed the following: Minimal patchy opacities in the retrocardiac region with central peribronchial wall thickening bilaterally. Findings may be due to bronchitis or developing pneumonia.  The patient was given IV cefepime , Flagyl  and  vancomycin , Tylenol  975 mg p.o. and 1.75 L of IV lactated Ringer .  She will be admitted to a medical telemetry bed for further evaluation and management. PAST MEDICAL HISTORY:   Past Medical History:  Diagnosis Date   Anemia, mild    Arthritis    "qwhere" (02/10/2016)   CAD (coronary artery disease)    a. VF arrest 01/2009/CAD with inferoposterior MI s/p aspiration thrombectomy/BMS of RCA at that time. b. ISR of BMS s/p DES to RCA 06/2010 with moderate LM/LAD disease not significant by FFR. c. s/p angiosculpt PTCA to prox RCA 01/2016 for ISR. d. Aggressive PTCA to prox RCA for ISR 05/2016.   Cardiac arrest (HCC) 01/2009   a. in setting of inf-post STEMI 01/2009 (VF).   Chest pain 06/04/2016   Chronic bronchitis (HCC)    Chronic combined systolic and diastolic CHF (congestive heart failure) (HCC)    a. EF 40-45% by cath 2010. b. 60-65% with grade 1 DD by echo 09/2015   COPD (chronic obstructive pulmonary disease) (HCC)    Hyperlipidemia 01/28/2016   Hyperlipidemia LDL goal <70 01/28/2016   Hypertension    Lung mass    MI (myocardial infarction) (HCC) 01/2009   Narcotic abuse (HCC)    pt now taking Suboxone  tid   Pleural effusion on left 09/24/2015   Pneumonia 06/2014; 09/2015   Pre-diabetes    Pulmonary nodule    PVD (peripheral vascular disease) (HCC)    mild atherosclerosis of infrarenal aorta, 25%  ostial left renal artery stenosis, 50% ostial right common iliac by cath 2010   S/P PTCA (percutaneous transluminal coronary angioplasty) 02/10/16 to RCA lesion for in stent restenois 02/11/2016   Subclavian artery stenosis (HCC)    a. >50% by duplex 05/2016.   Tobacco abuse    Unstable angina (HCC)     PAST SURGICAL HISTORY:   Past Surgical History:  Procedure Laterality Date   ABDOMINAL HYSTERECTOMY     ANKLE FRACTURE SURGERY Right 2000s   CARDIAC CATHETERIZATION N/A 02/10/2016   Procedure: Left Heart Cath and Coronary Angiography;  Surgeon: Lucendia Rusk, MD;  Location: [facility] INVASIVE CV  LAB;  Service: Cardiovascular;  Laterality: N/A;   CARDIAC CATHETERIZATION N/A 02/10/2016   Procedure: Coronary Balloon Angioplasty;  Surgeon: Lucendia Rusk, MD;  Location: [facility] INVASIVE CV LAB;  Service: Cardiovascular;  Laterality: N/A;  instent RCA   CARDIAC CATHETERIZATION N/A 06/05/2016   Procedure: Left Heart Cath and Coronary Angiography;  Surgeon: Arleen Lacer, MD;  Location[facility] INVASIVE CV LAB;  Service: Cardiovascular;  Laterality: N/A;   CARDIAC CATHETERIZATION N/A 06/05/2016   Procedure: Coronary Balloon Angioplasty;  Surgeon: Arleen Lacer, MD;  Location: [facility] INVASIVE CV LAB;  Service: Cardiovascular;  Laterality: N/A;   CHEST TUBE INSERTION N/A 10/08/2015   Procedure: PLEURX CATH REMOVAL;  Surgeon: Petra Brandy, MD;  Location: [facility] ORS;  Service: Thoracic;  Laterality: N/A;   CORONARY ANGIOPLASTY WITH STENT PLACEMENT  01/2009; 2012   FRACTURE SURGERY     RIGHT/LEFT HEART CATH AND CORONARY ANGIOGRAPHY N/A 09/24/2020   Procedure: RIGHT/LEFT HEART CATH AND CORONARY ANGIOGRAPHY;  Surgeon: Sammy Crisp, MD;  Location: [facility] INVASIVE CV LAB;  Service: Cardiovascular;  Laterality: N/A;   VIDEO ASSISTED THORACOSCOPY (VATS)/THOROCOTOMY Left 09/29/2015   Procedure: PREOP BRONCHOSCOPY, LEFT THORACOSCOPY, POSSIBLE THORACOTOMY, PLEURAL BIOPSY, TALC ;  Surgeon: Petra Brandy, MD;  Location: [facility] ORS;  Service: General;  Laterality: Left;    SOCIAL HISTORY:   Social History   Tobacco Use   Smoking status: Former    Current packs/day: 0.00    Average packs/day: 0.5 packs/day for 56.0 years (28.0 ttl pk-yrs)    Types: Cigarettes    Start date: 09/10/1964    Quit date: 09/10/2020    Years since quitting: 3.0   Smokeless tobacco: Never  Substance Use Topics   Alcohol use: No    Alcohol/week: 0.0 standard drinks of alcohol    FAMILY HISTORY:   Family History  Problem Relation Age of Onset   Coronary artery disease Father    Hyperlipidemia Father    Early death Father    Heart  attack Father    Coronary artery disease Mother    Hyperlipidemia Mother    Heart attack Mother    Cancer Brother     DRUG ALLERGIES:   Allergies  Allergen Reactions   Pregabalin Other (See Comments)    'Makes me extremely sedated'   Azithromycin  Rash    REVIEW OF SYSTEMS:   ROS As per history of present illness. All pertinent systems were reviewed above. Constitutional, HEENT, cardiovascular, respiratory, GI, GU, musculoskeletal, neuro, psychiatric, endocrine, integumentary and hematologic systems were reviewed and are otherwise negative/unremarkable except for positive findings mentioned above in the HPI.   MEDICATIONS AT HOME:   Prior to Admission medications   Medication Sig Start Date End Date Taking? Authorizing Provider  acetaminophen  (TYLENOL ) 500 MG tablet Take 1,000 mg by mouth every 6 (six) hours as needed for headache.    [provider]  albuterol  (PROVENTIL  HFA;VENTOLIN  HFA) 108 (90 Base) MCG/ACT inhaler Inhale 2 puffs into the lungs every 4 (four) hours as needed for wheezing or shortness of breath.    [provider]  aspirin  EC 81 MG tablet Take 81 mg by mouth at bedtime.     [provider]  azelastine (ASTELIN) 0.1 % nasal spray Place 2 sprays into both nostrils daily. 08/14/17   [provider]  Buprenorphine  HCl-Naloxone  HCl 8-2 MG FILM Place 0.5 Film under the tongue daily.    [provider]  cetirizine  (ZYRTEC ) 10 MG tablet Take 1 tablet (10 mg total) by mouth daily. 03/11/18   Jerrye Mori, PA-C  clopidogrel  (PLAVIX ) 75 MG tablet Take 1 tablet (75 mg total) by mouth daily. 02/13/22   Clearnce Curia, NP  furosemide  (LASIX ) 20 MG tablet Take 1 tablet (20 mg total) by mouth daily. MAY TAKE AN EXTRA 20 MG AS NEEDED FOR 2 LB WEIGHT GAIN IN 24 OR 5 LB WEIGHT GAIN IN 5 DAYS Patient taking differently: Take 20 mg by mouth daily. MAY TAKE AN EXTRA 20 MG AS NEEDED FOR 2 LB WEIGHT GAIN IN 24 OR 5 LB WEIGHT GAIN IN 5  DAYS Taking PRN 07/03/22   Walker, Caitlin S, NP  levothyroxine  (SYNTHROID ) 88 MCG tablet Take 88 mcg by mouth daily. 05/26/20   [provider]  losartan  (COZAAR ) 50 MG tablet Take 1 tablet (50 mg total) by mouth daily. 02/14/22 02/09/23  Clearnce Curia, NP  meloxicam (MOBIC) 7.5 MG tablet Take 7.5 mg by mouth daily. 07/26/22   [provider]  metoprolol  succinate (TOPROL -XL) 50 MG 24 hr tablet Take 1 tablet (50 mg total) by mouth daily. Take with or immediately following a meal. 03/09/23   Clearnce Curia, NP  nitroGLYCERIN  (NITROSTAT ) 0.4 MG SL tablet Place 1 tablet (0.4 mg total) under the tongue every 5 (five) minutes as needed for chest pain. 11/02/20 03/10/21  Walker, Caitlin S, NP  nystatin  cream (MYCOSTATIN ) Apply topically 2 (two) times daily. 07/06/22   [provider]  ondansetron  (ZOFRAN ) 4 MG tablet Take 4 mg by mouth every 6 (six) hours. 08/21/22   [provider]  rosuvastatin  (CRESTOR ) 20 MG tablet Take 1 tablet (20 mg total) by mouth daily. 08/09/23   Walker, Caitlin S, NP  tirzepatide [facility]) 5 MG/0.5ML Pen Inject 5 mg into the skin once a week.    [provider]  TRELEGY ELLIPTA 100-62.5-25 MCG/INH AEPB Take 1 puff by mouth daily. 08/13/20   [provider]  triamcinolone cream (KENALOG) 0.1 % Apply 1 application topically 2 (two) times daily. to affected area 08/11/20   [provider]      VITAL SIGNS:  Blood pressure (!) 128/53, pulse 80, temperature 98.7 F (37.1 C), resp. rate 18, height 4\' 11"  (1.499 m), weight 64 kg, SpO2 91%.  PHYSICAL EXAMINATION:  Physical Exam  GENERAL:  78 y.o.-year-old Caucasian female patient lying in the bed with mod respiratory distress with conversational dyspnea. EYES: Pupils equal, round, reactive to light and accommodation. No scleral icterus. Extraocular muscles intact.  HEENT: Head atraumatic, normocephalic. Oropharynx and nasopharynx clear.  NECK:  Supple, no jugular  venous distention. No thyroid enlargement, no tenderness.  LUNGS: Diminished bibasilar breath sounds with left midlung zone crackles.  No use of accessory muscles of respiration.  CARDIOVASCULAR: Regular rate and rhythm, S1, S2 normal. No murmurs, rubs, or gallops.  ABDOMEN: Soft, nondistended, nontender. Bowel sounds present. No organomegaly or mass.  EXTREMITIES: No pedal edema, cyanosis, or clubbing.  NEUROLOGIC: Cranial nerves II through XII are intact. Muscle strength 5/5 in all extremities. Sensation intact. Gait not checked.  PSYCHIATRIC: The patient is somnolent but arousable and oriented x 3.  Normal affect and good eye contact. SKIN: No obvious rash, lesion, or ulcer.   LABORATORY PANEL:   CBC Recent Labs  Lab 10/13/23 0214  WBC 8.7  HGB 9.1*  HCT 27.9*  PLT 227   ------------------------------------------------------------------------------------------------------------------  Chemistries  Recent Labs  Lab 10/12/23 2250 10/13/23 0214  NA 137 137  K 4.1 4.1  CL 104 105  CO2 24 21*  GLUCOSE 104* 113*  BUN 20 18  CREATININE 0.80 0.76  CALCIUM  8.8* 8.3*  AST 18  --   ALT 13  --   ALKPHOS 59  --   BILITOT 0.5  --    ------------------------------------------------------------------------------------------------------------------  Cardiac Enzymes No results for input(s): "TROPONINI" in the last 168 hours. ------------------------------------------------------------------------------------------------------------------  RADIOLOGY:  DG Chest Port 1 View Result Date: 10/12/2023 CLINICAL DATA:  Hypoxia EXAM: PORTABLE CHEST 1 VIEW COMPARISON:  Chest x-ray 05/10/2023 FINDINGS: There are minimal patchy opacities in the retrocardiac region with central peribronchial wall thickening bilaterally. There is no pleural effusion or pneumothorax. Cardiomediastinal silhouette is within normal limits. No acute fractures are seen. IMPRESSION: Minimal patchy opacities in the  retrocardiac region with central peribronchial wall thickening bilaterally. Findings may be due to bronchitis or developing pneumonia. Electronically Signed   By: Tyron Gallon M.D.   On: 10/12/2023 23:29   CT Head Wo Contrast Result Date: 10/12/2023 EXAM: CT HEAD WITHOUT TECHNIQUE: CT of the head was performed without the administration of intravenous contrast. Automated exposure control, iterative reconstruction, and/or weight based adjustment of the mA/kV was utilized to reduce the radiation dose to as low as reasonably achievable. COMPARISON: CT Head dated 06/24/23. CLINICAL HISTORY: Mental status change, unknown cause. Patient arrives via [facility] from home for altered mental status. EMS originally called out for left arm numbness - unknown LKW. Patient also experiencing palpitations. Ems reports patient was unable to stay awake and was losing consciousness while talking. Rash on stomach appeared during ems assessment. 187/89, HR 110, SPO2 94, CBG 145. FINDINGS: BRAIN AND VENTRICLES: Global cortical atrophy. Subcortical and periventricular small vessel ischemic changes. Intracranial atherosclerosis. ORBITS: No acute abnormality is mentioned. 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 SOFT TISSUES AND SKULL: No acute abnormality of the visualized skull or soft tissues. IMPRESSION: 1. No acute intracranial abnormality. 2. Global cortical atrophy and small vessel ischemic changes. Electronically signed by: Zadie Herter MD 10/12/2023 11:28 PM EDT RP Workstation: [id number]      IMPRESSION AND PLAN:  Assessment and Plan: * Sepsis due to pneumonia (HCC) - Sepsis manifested by fever, tachycardia and tachypnea. - The patient will be admitted to a medical telemetry bed. - Will continue antibiotic therapy with IV Rocephin  and Zithromax . - Mucolytic therapy and bronchodilator therapy will be provided. - The patient will be hydrated with IV lactated ringer . - Will follow blood  cultures.  Coronary artery disease - Will continue Plavix , Toprol -XL, as needed sublingual (, statin therapy and ARB therapy.  Essential hypertension - Will continue antihypertensive therapy.  Hypothyroidism - Will continue Synthroid . - Will check her TSH.  HFrEF (heart failure with reduced ejection fraction) (HCC) - Will continue Lasix , ARB and Toprol -XL.   DVT prophylaxis: Lovenox . Advanced Care Planning:  Code Status: full code. Family Communication:  The plan of care was discussed  in details with the patient (and family). I answered all questions. The patient agreed to proceed with the above mentioned plan. Further management will depend upon hospital course. Disposition Plan: Back to previous home environment Consults called: none. All the records are reviewed and case discussed with ED provider.  Status is: Inpatient  At the time of the admission, it appears that the appropriate admission status for this patient is inpatient.  This is judged to be reasonable and necessary in order to provide the required intensity of service to ensure the patient's safety given the presenting symptoms, physical exam findings and initial radiographic and laboratory data in the context of comorbid conditions.  The patient requires inpatient status due to high intensity of service, high risk of further deterioration and high frequency of surveillance required.  I certify that at the time of admission, it is my clinical judgment that the patient will require inpatient hospital care extending more than 2 midnights.                            Dispo: The patient is from: Home              Anticipated d/c is to: Home              Patient currently is not medically stable to d/c.              Difficult to place patient: No  Virgene Griffin M.D on 10/13/2023 at 5:48 AM  [facility]   From 7 PM-7 AM, contact night-coverage [email or web address]  CC: Primary care physician; Marce Sensing, NP

## 2023-10-13 NOTE — Progress Notes (Signed)
 [facility]  - [place] [facility]   PATIENT NAME: Kathryn Garrett    MR#:  [id number]  DATE OF BIRTH:  [birth year]  SUBJECTIVE:  grandson at bedside. Patient appears sleepy however did answer most questions appropriately. Came in with increased fatigue ability sleepiness fever and chills. Found to have pneumonia. Has some dry cough. Poor appetite. No vomiting.    VITALS:  Blood pressure (!) 142/52, pulse 91, temperature 98.7 F (37.1 C), resp. rate 16, height 4\' 11"  (1.499 m), weight 64 kg, SpO2 96%.  PHYSICAL EXAMINATION:   GENERAL:  78 y.o.-year-old patient with no acute distress. Obese LUNGS: coarse breath sounds bilaterally, no wheezing CARDIOVASCULAR: S1, S2 normal. No murmur   ABDOMEN: Soft, nontender, nondistended. Bowel sounds present.  EXTREMITIES: No  edema b/l.    NEUROLOGIC: nonfocal  patient is sleepy but answers quesitons   LABORATORY PANEL:  CBC Recent Labs  Lab 10/13/23 0214  WBC 8.7  HGB 9.1*  HCT 27.9*  PLT 227    Chemistries  Recent Labs  Lab 10/12/23 2250 10/13/23 0214  NA 137 137  K 4.1 4.1  CL 104 105  CO2 24 21*  GLUCOSE 104* 113*  BUN 20 18  CREATININE 0.80 0.76  CALCIUM  8.8* 8.3*  AST 18  --   ALT 13  --   ALKPHOS 59  --   BILITOT 0.5  --    Cardiac Enzymes No results for input(s): "TROPONINI" in the last 168 hours. RADIOLOGY:  DG Chest Port 1 View Result Date: 10/12/2023 CLINICAL DATA:  Hypoxia EXAM: PORTABLE CHEST 1 VIEW COMPARISON:  Chest x-ray 05/10/2023 FINDINGS: There are minimal patchy opacities in the retrocardiac region with central peribronchial wall thickening bilaterally. There is no pleural effusion or pneumothorax. Cardiomediastinal silhouette is within normal limits. No acute fractures are seen. IMPRESSION: Minimal patchy opacities in the retrocardiac region with central peribronchial wall thickening bilaterally. Findings may be due to bronchitis or developing pneumonia. Electronically Signed   By:  Tyron Gallon M.D.   On: 10/12/2023 23:29   CT Head Wo Contrast Result Date: 10/12/2023 EXAM: CT HEAD WITHOUT TECHNIQUE: CT of the head was performed without the administration of intravenous contrast. Automated exposure control, iterative reconstruction, and/or weight based adjustment of the mA/kV was utilized to reduce the radiation dose to as low as reasonably achievable. COMPARISON: CT Head dated 06/24/23. CLINICAL HISTORY: Mental status change, unknown cause. Patient arrives via [facility] from home for altered mental status. EMS originally called out for left arm numbness - unknown LKW. Patient also experiencing palpitations. Ems reports patient was unable to stay awake and was losing consciousness while talking. Rash on stomach appeared during ems assessment. 187/89, HR 110, SPO2 94, CBG 145. FINDINGS: BRAIN AND VENTRICLES: Global cortical atrophy. Subcortical and periventricular small vessel ischemic changes. Intracranial atherosclerosis. ORBITS: No acute abnormality is mentioned. 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 SOFT TISSUES AND SKULL: No acute abnormality of the visualized skull or soft tissues. IMPRESSION: 1. No acute intracranial abnormality. 2. Global cortical atrophy and small vessel ischemic changes. Electronically signed by: Zadie Herter MD 10/12/2023 11:28 PM EDT RP Workstation: [id number]    Assessment and Plan  Kathryn Garrett is a 78 y.o. female with medical history significant for osteoarthritis, coronary artery disease, chronic systolic and diastolic CHF, COPD, hypertension, dyslipidemia, who presented to the emergency room with acute onset of malaise over the last couple of days with associated cough that is mostly dry with dyspnea  and wheezing.  Pt reported having fever and chills.    chest x-ray Minimal patchy opacities in the retrocardiac region with central peribronchial wall thickening bilaterally. Findings may be due to bronchitis or  developing pneumonia.  Sepsis due to pneumonia (HCC) Hypoxia with h/o COPD H/o smoking in the past - Sepsis manifested by fever, tachycardia and tachypnea. - continue antibiotic therapy with IV Rocephin  and Doxycycline  - Mucolytic therapy and bronchodilator therapy will be provided. - Received IVF - blood cultures negative so far -- LA WNL -Wbc back to normal, no fever --cont oxygen --wean as tolerated (pt do not wear oxygen  at home)   Coronary artery disease - continue Plavix , Toprol -XL, as needed sublingual (, statin therapy and ARB therapy.   Essential hypertension - continue antihypertensive therapy.   Hypothyroidism - continue Synthroid .   HFrEF (heart failure with reduced ejection fraction) (HCC) -  continue Lasix  prn, ARB and Toprol -XL.   Procedures: Family communication :daughter Amy on the phone Consults :none CODE STATUS: full DVT Prophylaxis :lovenox  Level of care: Telemetry Medical Status is: Inpatient Remains inpatient appropriate because: Sepsis due to CAP    TOTAL TIME TAKING CARE OF THIS PATIENT: 45 minutes.  >50% time spent on counselling and coordination of care  Note: This dictation was prepared with Dragon dictation along with smaller phrase technology. Any transcriptional errors that result from this process are unintentional.  Melvinia Stager M.D    [facility]   CC: Primary care physician; Marce Sensing, NP

## 2023-10-13 NOTE — Assessment & Plan Note (Signed)
-   Will continue Lasix , ARB and Toprol -XL.

## 2023-10-13 NOTE — Assessment & Plan Note (Signed)
-   Will continue Synthroid . - Will check her TSH.

## 2023-10-13 NOTE — Plan of Care (Signed)
  Problem: Respiratory: Goal: Ability to maintain adequate ventilation will improve Outcome: Not Progressing   Problem: Health Behavior/Discharge Planning: Goal: Ability to manage health-related needs will improve Outcome: Not Progressing   Problem: Activity: Goal: Risk for activity intolerance will decrease Outcome: Not Progressing

## 2023-10-13 NOTE — Assessment & Plan Note (Addendum)
-   Sepsis manifested by fever, tachycardia and tachypnea. - The patient will be admitted to a medical telemetry bed. - Will continue antibiotic therapy with IV Rocephin  and Zithromax . - Mucolytic therapy and bronchodilator therapy will be provided. - The patient will be hydrated with IV lactated ringer . - Will follow blood cultures.

## 2023-10-13 NOTE — Assessment & Plan Note (Signed)
-   Will continue Plavix , Toprol -XL, as needed sublingual (, statin therapy and ARB therapy.

## 2023-10-14 DIAGNOSIS — E039 Hypothyroidism, unspecified: Secondary | ICD-10-CM | POA: Diagnosis not present

## 2023-10-14 DIAGNOSIS — J189 Pneumonia, unspecified organism: Secondary | ICD-10-CM | POA: Diagnosis not present

## 2023-10-14 DIAGNOSIS — I5020 Unspecified systolic (congestive) heart failure: Secondary | ICD-10-CM | POA: Diagnosis not present

## 2023-10-14 DIAGNOSIS — I10 Essential (primary) hypertension: Secondary | ICD-10-CM | POA: Diagnosis not present

## 2023-10-14 MED ORDER — FUROSEMIDE 10 MG/ML IJ SOLN
40.0000 mg | Freq: Once | INTRAMUSCULAR | Status: AC
  Administered 2023-10-14: 40 mg via INTRAVENOUS
  Filled 2023-10-14: qty 4

## 2023-10-14 MED ORDER — PREDNISONE 20 MG PO TABS
20.0000 mg | ORAL_TABLET | Freq: Every day | ORAL | Status: DC
  Administered 2023-10-15 – 2023-10-16 (×2): 20 mg via ORAL
  Filled 2023-10-14 (×2): qty 1

## 2023-10-14 NOTE — Progress Notes (Addendum)
 [facility]  -  at [facility]   PATIENT NAME: Kathryn Garrett    MR#:  [id number]  DATE OF BIRTH:  [birth year]  SUBJECTIVE:  grandson at bedside.  Patient more awake and alert. She ambulated around with physical therapy became short winded and sats drop in the 80s. She is currently back on 2 L nasal cannula oxygen . Cough present. No fever. Eating some  VITALS:  Blood pressure (!) 157/60, pulse 80, temperature (!) 97.4 F (36.3 C), resp. rate 16, height 4\' 11"  (1.499 m), weight 64 kg, SpO2 94%.  PHYSICAL EXAMINATION:   GENERAL:  78 y.o.-year-old patient with no acute distress. Obese LUNGS: coarse breath sounds bilaterally, no wheezing CARDIOVASCULAR: S1, S2 normal. No murmur   ABDOMEN: Soft, nontender, nondistended. Bowel sounds present.  EXTREMITIES: No  edema b/l.    NEUROLOGIC: nonfocal  patient is sleepy but answers quesitons   LABORATORY PANEL:  CBC Recent Labs  Lab 10/13/23 0214  WBC 8.7  HGB 9.1*  HCT 27.9*  PLT 227    Chemistries  Recent Labs  Lab 10/12/23 2250 10/13/23 0214  NA 137 137  K 4.1 4.1  CL 104 105  CO2 24 21*  GLUCOSE 104* 113*  BUN 20 18  CREATININE 0.80 0.76  CALCIUM  8.8* 8.3*  AST 18  --   ALT 13  --   ALKPHOS 59  --   BILITOT 0.5  --    Cardiac Enzymes No results for input(s): "TROPONINI" in the last 168 hours. RADIOLOGY:  DG Chest Port 1 View Result Date: 10/12/2023 CLINICAL DATA:  Hypoxia EXAM: PORTABLE CHEST 1 VIEW COMPARISON:  Chest x-ray 05/10/2023 FINDINGS: There are minimal patchy opacities in the retrocardiac region with central peribronchial wall thickening bilaterally. There is no pleural effusion or pneumothorax. Cardiomediastinal silhouette is within normal limits. No acute fractures are seen. IMPRESSION: Minimal patchy opacities in the retrocardiac region with central peribronchial wall thickening bilaterally. Findings may be due to bronchitis or developing pneumonia. Electronically Signed   By: Tyron Gallon M.D.   On: 10/12/2023 23:29   CT Head Wo Contrast Result Date: 10/12/2023 EXAM: CT HEAD WITHOUT TECHNIQUE: CT of the head was performed without the administration of intravenous contrast. Automated exposure control, iterative reconstruction, and/or weight based adjustment of the mA/kV was utilized to reduce the radiation dose to as low as reasonably achievable. COMPARISON: CT Head dated 06/24/23. CLINICAL HISTORY: Mental status change, unknown cause. Patient arrives via [facility] from home for altered mental status. EMS originally called out for left arm numbness - unknown LKW. Patient also experiencing palpitations. Ems reports patient was unable to stay awake and was losing consciousness while talking. Rash on stomach appeared during ems assessment. 187/89, HR 110, SPO2 94, CBG 145. FINDINGS: BRAIN AND VENTRICLES: Global cortical atrophy. Subcortical and periventricular small vessel ischemic changes. Intracranial atherosclerosis. ORBITS: No acute abnormality is mentioned. 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 SOFT TISSUES AND SKULL: No acute abnormality of the visualized skull or soft tissues. IMPRESSION: 1. No acute intracranial abnormality. 2. Global cortical atrophy and small vessel ischemic changes. Electronically signed by: Zadie Herter MD 10/12/2023 11:28 PM EDT RP Workstation: [id number]    Assessment and Plan  Kathryn Garrett is a 78 y.o. female with medical history significant for osteoarthritis, coronary artery disease, chronic systolic and diastolic CHF, COPD, hypertension, dyslipidemia, who presented to the emergency room with acute onset of malaise over the last couple of days with associated cough that  is mostly dry with dyspnea and wheezing.  Pt reported having fever and chills.    chest x-ray Minimal patchy opacities in the retrocardiac region with central peribronchial wall thickening bilaterally. Findings may be due to bronchitis or  developing pneumonia.  Sepsis due to pneumonia (HCC) Hypoxia with h/o COPD H/o smoking in the past - Sepsis manifested by fever, tachycardia and tachypnea. - continue antibiotic therapy with IV Rocephin  and Doxycycline  - Mucolytic therapy and bronchodilator therapy will be provided. - Received IVF - blood cultures negative so far -- LA WNL -Wbc back to normal, no fever --cont oxygen --wean as tolerated (pt do not wear oxygen  at home) --6/1-- ambulated with physical therapy. She will require home oxygen . Given shortness of breath elevated BNP I'll give one dose of Lasix  40 mg times one today. Will start a short course of steroids as well. --echo 2023--EF 60%. Will repeat echo    Coronary artery disease - continue Plavix , Toprol -XL, as needed sublingual ,statin therapy and ARB therapy.   Essential hypertension - continue antihypertensive therapy.   Hypothyroidism - continue Synthroid .   HFrEF (heart failure with reduced ejection fraction) (HCC) -  continue Lasix  prn, ARB and Toprol -XL.   Procedures: Family communication :grandson Consults :none CODE STATUS: full DVT Prophylaxis :lovenox  Level of care: Telemetry Medical Status is: Inpatient Remains inpatient appropriate because: Sepsis due to CAP    TOTAL TIME TAKING CARE OF THIS PATIENT: 40 minutes.  >50% time spent on counselling and coordination of care  Note: This dictation was prepared with Dragon dictation along with smaller phrase technology. Any transcriptional errors that result from this process are unintentional.  Melvinia Stager M.D    [facility]   CC: Primary care physician; Marce Sensing, NP

## 2023-10-14 NOTE — Progress Notes (Signed)
 SATURATION QUALIFICATIONS: (This note is used to comply with regulatory documentation for home oxygen )  Patient Saturations on Room Air at Rest = 95%  Patient Saturations on Room Air while Ambulating = 90%  Patient Saturations on 0 Liters of oxygen  while Ambulating = 0%  Please briefly explain why patient needs home oxygen : Patient ambulated on RA around the unit. She dropped to 90%, the lowest, and she quickly recovered back to 95-96%

## 2023-10-14 NOTE — TOC Initial Note (Signed)
 Transition of Care [facility]) - Initial/Assessment Note    Patient Details  Name: Kathryn Garrett MRN: [id number] Date of Birth: 1945-08-21  Transition of Care [facility]) CM/SW Contact:    Loman Risk, RN Phone Number: 10/14/2023, 4:34 PM  Clinical Narrative:                  Admitted WUJ:[id number] Admitted from: Home alone.  Grandson stays with her at night  PCP: Milburn Aliment  Current home health/prior home health/DME: NA  Therapy recommending home health.  Patient requesting [facility].  Sent to [place]  with [facility] for review. Awaiting response if they can accept.  Patient currently does not qualify for home O2, per MD for sats to be checked again tomorrow         Patient Goals and CMS Choice            Expected Discharge Plan and Services                                              Prior Living Arrangements/Services                       Activities of Daily Living   ADL Screening (condition at time of admission) Independently performs ADLs?: Yes (appropriate for developmental age) Is the patient deaf or have difficulty hearing?: No Does the patient have difficulty seeing, even when wearing glasses/contacts?: No Does the patient have difficulty concentrating, remembering, or making decisions?: No  Permission Sought/Granted                  Emotional Assessment              Admission diagnosis:  Drug rash [L27.0] Hypoxia [R09.02] Fever, unspecified fever cause [R50.9] Altered mental status, unspecified altered mental status type [R41.82] Sepsis due to pneumonia (HCC) [J18.9, A41.9] Community acquired pneumonia, unspecified laterality [J18.9] Sepsis, due to unspecified organism, unspecified whether acute organ dysfunction present [facility]) [A41.9] Patient Active Problem List   Diagnosis Date Noted   Sepsis due to pneumonia (HCC) 10/13/2023   Hypothyroidism 10/13/2023   Essential hypertension 10/13/2023   Coronary artery disease  10/13/2023   SVT (supraventricular tachycardia) (HCC)    Acute combined systolic and diastolic heart failure (HCC)    Pure hypercholesterolemia    Acute on chronic HFrEF (heart failure with reduced ejection fraction) (HCC)    Acute respiratory failure (HCC) 09/11/2020   HFrEF (heart failure with reduced ejection fraction) (HCC)    Acute exacerbation of chronic obstructive pulmonary disease (COPD) (HCC) 09/10/2020   Severe sepsis (HCC) 09/10/2020   NSTEMI (non-ST elevated myocardial infarction) (HCC) 09/10/2020   Hyperglycemia 09/10/2020   SOB (shortness of breath) 09/10/2020   Chronic obstructive pulmonary disease (HCC) 03/05/2018   History of opioid abuse (HCC) 03/05/2018   Nocturnal hypoxemia 10/30/2017   Peripheral arterial disease (HCC) 08/30/2016   Chest pain 06/04/2016   S/P PTCA (percutaneous transluminal coronary angioplasty) 02/10/16 to RCA lesion for in stent restenois 02/11/2016   Unstable angina (HCC)    Hyperlipidemia LDL goal <70 01/28/2016   Presence of coronary angioplasty implant and graft 10/15/2015   Tobacco abuse 10/15/2015   CAP (community acquired pneumonia) 09/24/2015   CAD in native artery 09/24/2015   HTN (hypertension) 09/24/2015   Lung nodule, solitary 03/31/2014   Liver nodule 03/31/2014   PCP:  Burney Carter  F, NP Pharmacy:   [facility] [phone] Jannice Mends, [place] - [address] AT [facility] OF [address] [place] [address] Phone: [phone] Fax: [phone]  [id number] Tyrone Gallop, [place] - [address] AT [facility] OF [address] & [facility] [address] [place] [address] Phone: [phone] Fax: [phone]  [id number] - [place], [place] - [address] [place] [address] Phone: [phone] Fax: [phone]     Social Drivers of Health (SDOH) Social History: SDOH Screenings   Food Insecurity: No Food Insecurity (10/13/2023)  Housing: Low Risk   (10/13/2023)  Transportation Needs: No Transportation Needs (10/13/2023)  Utilities: Not At Risk (10/13/2023)  Depression (PHQ2-9): Low Risk  (08/28/2023)  Financial Resource Strain: Low Risk  (08/28/2023)  Social Connections: Moderately Isolated (10/13/2023)  Tobacco Use: Medium Risk (10/12/2023)   SDOH Interventions:     Readmission Risk Interventions     No data to display

## 2023-10-14 NOTE — Evaluation (Signed)
 Physical Therapy Evaluation Patient Details Name: Kathryn Garrett MRN: [id number] DOB: October 14, 1945 Today's Date: 10/14/2023  History of Present Illness  Kathryn Garrett is a 78 y.o. female with medical history significant for osteoarthritis, coronary artery disease, chronic systolic and diastolic CHF, COPD, hypertension, dyslipidemia, who presented to the emergency room with acute onset of malaise over the last couple of days with associated cough that is mostly dry with dyspnea and wheezing.   Clinical Impression  Pt admitted with above diagnosis. Pt currently with functional limitations due to the deficits listed below (see PT Problem List). Pt received upright in bed agreeable to PT services. See separate note for O2 sats but pt requiring supplemental O2 to maintain 90 or greater with SPO2. Reports PTA being independent still working and driving. Has family check in on her daily.   To date, pt mobilizes in bed mobility and STS independently. Able to don socks independently in sitting EOB and complete ~120' of gait at supervision. Pt does have limited activity tolerance due to worsening SOB requiring titration to 2 L/min requesting to return to room. Pt left in recliner requiring 3 L/min to maintain 90-92% at rest. Educated on energy conservation strategies and importance of PLB. Pt understanding with all needs in reach. Pt will benefit from skilled PT services to address acute deficits in poor endurance due to acute admission to maximize return to PLOF.       If plan is discharge home, recommend the following: Help with stairs or ramp for entrance;Assist for transportation   Can travel by private vehicle        Equipment Recommendations None recommended by PT  Recommendations for Other Services       Functional Status Assessment Patient has had a recent decline in their functional status and demonstrates the ability to make significant improvements in function in a reasonable and  predictable amount of time.     Precautions / Restrictions Precautions Precautions: Fall Recall of Precautions/Restrictions: Intact Precaution/Restrictions Comments: Monitor O2 Restrictions Weight Bearing Restrictions Per Provider Order: No      Mobility  Bed Mobility Overal bed mobility: Independent               Patient Response: Cooperative  Transfers Overall transfer level: Independent                      Ambulation/Gait Ambulation/Gait assistance: Supervision Gait Distance (Feet): 120 Feet Assistive device: None Gait Pattern/deviations: Step-to pattern, Decreased step length - right, Decreased step length - left       General Gait Details: small, shuffling steps but reports ambulating at her baseline  Stairs            Wheelchair Mobility     Tilt Bed Tilt Bed Patient Response: Cooperative  Modified Rankin (Stroke Patients Only)       Balance Overall balance assessment: Mild deficits observed, not formally tested                                           Pertinent Vitals/Pain Pain Assessment Pain Assessment: No/denies pain    Home Living Family/patient expects to be discharged to:: Private residence Living Arrangements: Other relatives (grand son spends nights with patient) Available Help at Discharge: Family;Available 24 hours/day Type of Home: House Home Access: Stairs to enter Entrance Stairs-Rails: Right;Left;Can reach both Entrance Stairs-Number of Steps: 2  Home Layout: One level Home Equipment: None      Prior Function Prior Level of Function : Independent/Modified Independent;Working/employed                     Extremity/Trunk Assessment   Upper Extremity Assessment Upper Extremity Assessment: Overall WFL for tasks assessed    Lower Extremity Assessment Lower Extremity Assessment: Overall WFL for tasks assessed       Communication   Communication Communication: No apparent  difficulties    Cognition Arousal: Alert Behavior During Therapy: WFL for tasks assessed/performed   PT - Cognitive impairments: No apparent impairments                         Following commands: Intact       Cueing Cueing Techniques: Verbal cues     General Comments      Exercises Other Exercises Other Exercises: Role of PT in acute setting, PLB, energy conservation strategies   Assessment/Plan    PT Assessment Patient needs continued PT services  PT Problem List Decreased strength;Decreased activity tolerance       PT Treatment Interventions DME instruction;Therapeutic exercise;Gait training;Balance training;Stair training;Neuromuscular re-education;Functional mobility training;Therapeutic activities;Patient/family education    PT Goals (Current goals can be found in the Care Plan section)  Acute Rehab PT Goals Patient Stated Goal: improve her breathing PT Goal Formulation: With patient Time For Goal Achievement: 10/28/23 Potential to Achieve Goals: Good    Frequency Min 1X/week     Co-evaluation               AM-PAC PT "6 Clicks" Mobility  Outcome Measure Help needed turning from your back to your side while in a flat bed without using bedrails?: None Help needed moving from lying on your back to sitting on the side of a flat bed without using bedrails?: None Help needed moving to and from a bed to a chair (including a wheelchair)?: None Help needed standing up from a chair using your arms (e.g., wheelchair or bedside chair)?: A Little Help needed to walk in hospital room?: A Little Help needed climbing 3-5 steps with a railing? : A Little 6 Click Score: 21    End of Session Equipment Utilized During Treatment: Oxygen  Activity Tolerance: Patient tolerated treatment well Patient left: in chair;with call bell/phone within reach;with nursing/sitter in room;with family/visitor present Nurse Communication: Mobility status PT Visit Diagnosis:  Other abnormalities of gait and mobility (R26.89);Muscle weakness (generalized) (M62.81)    Time: 2956-2130 PT Time Calculation (min) (ACUTE ONLY): 17 min   Charges:   PT Evaluation $PT Eval Low Complexity: 1 Low   PT General Charges $$ ACUTE PT VISIT: 1 Visit        Marc Senior. Fairly IV, PT, DPT Physical Therapist- [place]  [facility]  10/14/2023, 10:19 AM

## 2023-10-14 NOTE — Progress Notes (Signed)
 PT Oxygen  Saturation Note  Patient Details Name: Kathryn Garrett MRN: [id number] DOB: 22-Jul-1945  SaO2 on room air at rest = 85-86% SaO2 on room air while ambulating = 84-85% SaO2 on 2 liters of O2 while ambulating = 88-90%    Marc Senior. Fairly IV, PT, DPT Physical Therapist- [place]  [facility]  10/14/2023, 10:49 AM

## 2023-10-15 ENCOUNTER — Inpatient Hospital Stay (HOSPITAL_COMMUNITY)
Admit: 2023-10-15 | Discharge: 2023-10-15 | Disposition: A | Discharge status: Home / Self Care | Attending: Internal Medicine | Admitting: Internal Medicine

## 2023-10-15 DIAGNOSIS — I5042 Chronic combined systolic (congestive) and diastolic (congestive) heart failure: Secondary | ICD-10-CM | POA: Diagnosis not present

## 2023-10-15 DIAGNOSIS — J9621 Acute and chronic respiratory failure with hypoxia: Secondary | ICD-10-CM

## 2023-10-15 DIAGNOSIS — A419 Sepsis, unspecified organism: Secondary | ICD-10-CM | POA: Diagnosis not present

## 2023-10-15 DIAGNOSIS — J189 Pneumonia, unspecified organism: Secondary | ICD-10-CM | POA: Diagnosis not present

## 2023-10-15 LAB — ECHOCARDIOGRAM COMPLETE
AR max vel: 2.55 cm2
AV Area VTI: 3.42 cm2
AV Area mean vel: 2.81 cm2
AV Mean grad: 4 mmHg
AV Peak grad: 8 mmHg
Ao pk vel: 1.42 m/s
Area-P 1/2: 2.97 cm2
Height: 59 in
MV VTI: 3.07 cm2
S' Lateral: 2.6 cm
Weight: 2257.51 [oz_av]

## 2023-10-15 MED ORDER — BUDESONIDE 0.25 MG/2ML IN SUSP
0.2500 mg | Freq: Two times a day (BID) | RESPIRATORY_TRACT | Status: AC
  Administered 2023-10-15 (×2): 0.25 mg via RESPIRATORY_TRACT
  Filled 2023-10-15 (×2): qty 2

## 2023-10-15 MED ORDER — FUROSEMIDE 20 MG PO TABS
20.0000 mg | ORAL_TABLET | Freq: Every day | ORAL | Status: DC
  Administered 2023-10-15 – 2023-10-16 (×2): 20 mg via ORAL
  Filled 2023-10-15 (×2): qty 1

## 2023-10-15 MED ORDER — IPRATROPIUM-ALBUTEROL 0.5-2.5 (3) MG/3ML IN SOLN
3.0000 mL | Freq: Four times a day (QID) | RESPIRATORY_TRACT | Status: DC
  Administered 2023-10-15 (×2): 3 mL via RESPIRATORY_TRACT
  Filled 2023-10-15 (×3): qty 3

## 2023-10-15 NOTE — Plan of Care (Signed)

## 2023-10-15 NOTE — Progress Notes (Addendum)
 PROGRESS NOTE  Kathryn Garrett    DOB: 1946/02/03, 78 y.o.  WGN:[id number]    Code Status: Full Code   DOA: 10/12/2023   LOS: 2   Brief hospital course  Kathryn Garrett is a 78 y.o. female with medical history significant for osteoarthritis, coronary artery disease, chronic systolic and diastolic CHF, COPD, hypertension, dyslipidemia, who presented to the emergency room with acute onset of malaise over the last couple of days with associated cough that is mostly dry with dyspnea and wheezing.   ED Course: temp 102 and blood pressure 142/74 with heart rate 115, respiratory rate of 15 and later 26 and pulse ox  88% on room air and 98% on 2 L of O2 by nasal cannula.  Labs revealed unremarkable CMP.  BNP was two 5.5 and high-sensitivity troponin was 10 and later 15.  Lactic acid was 1.2 later 1.1.  CBC showed WBCs of 10.6 with neutrophilia as well as mild anemia better than previous levels.  INR is 1.3 and PT 16.7.  Respiratory panel came back negative.  UA was unremarkable. EKG: sinus tachycardia with rate 105 with PACs and low voltage QRS. Imaging: chest x-ray: Minimal patchy opacities in the retrocardiac region with central peribronchial wall thickening bilaterally. Findings may be due to bronchitis or developing pneumonia.   The patient was given IV cefepime , Flagyl  and vancomycin , Tylenol  975 mg p.o. and 1.75 L of IV lactated Ringer .   Patient was admitted to medicine service for further workup and management of sepsis 2/2 PNA as outlined in detail below.  10/15/23 -stable on 1L  Assessment & Plan  Principal Problem:   Sepsis due to pneumonia [facility]) Active Problems:   HFrEF (heart failure with reduced ejection fraction) (HCC)   Hypothyroidism   Essential hypertension   Coronary artery disease  Sepsis due to pneumonia (HCC)- BxCx NGTD Hypoxia with h/o COPD H/o smoking in the past - Sepsis manifested by fever, tachycardia and tachypnea. Criteria have resolved - continue antibiotic  therapy with IV Rocephin  - discontinuing Doxycycline  as patient feels this is making her nauseous  - Mucolytic therapy and bronchodilator therapy will be provided. - wean O2 as tolerated. Still desats to 88% ORA while resting and when returned her 1L [place] she improved to 91%. She was asymptomatic. Unsure of her tolerance with exertion but would likely need O2 at home if went today. We discussed another day to try to wean back to room air so we can hopefully avoid having to go home with O2.  - tomorrow ambulate with pulse ox for final assessment for O2 at dc.  - f/u repeat echo today to r/u CHF contribution. Last echo 2023 showed overall normal results UPDATE: echo overall unchanged.    CAD  HFrEF - continue Plavix , Toprol -XL, as needed sublingual ,statin therapy and ARB therapy. - f/u echo - BID lasix , monitor UOP   Essential hypertension  low diastolics, widened pulse pressure. May need to allow higher goal systolics to avoid orthostatic hypotension.  - continue antihypertensive therapy.   Hypothyroidism - continue Synthroid .   Body mass index is 28.5 kg/m.  VTE ppx: enoxaparin  (LOVENOX ) injection 40 mg Start: 10/13/23 1000  Diet:     Diet   Diet Heart Room service appropriate? Yes; Fluid consistency: Thin   Consultants: None   Subjective 10/15/23    Pt reports feels better. She wants to go home today. Is agreeable to staying another day since she is concerned that she threw up her medications yesterday  and would prefer to wean from O2 prior to dc.    Objective   Blood pressure (!) 135/52, pulse 82, temperature 98.5 F (36.9 C), temperature source Oral, resp. rate 17, height 4\' 11"  (1.499 m), weight 64 kg, SpO2 95%.  Intake/Output Summary (Last 24 hours) at 10/15/2023 0845 Last data filed at 10/15/2023 6213 Gross per 24 hour  Intake 460 ml  Output 300 ml  Net 160 ml   Filed Weights   10/12/23 2246 10/13/23 0203  Weight: 50.3 kg 64 kg     Physical Exam:  General:  awake, alert, NAD HEENT: atraumatic, clear conjunctiva, anicteric sclera, MMM, hearing grossly normal Respiratory: normal respiratory effort. Inspiratory wheezing worse on left Cardiovascular: quick capillary refill, normal S1/S2, RRR, no JVD, murmurs Nervous: A&O x3. no gross focal neurologic deficits, normal speech Extremities: moves all equally, no edema, normal tone Skin: dry, intact, normal temperature, normal color. No rashes, lesions or ulcers on exposed skin Psychiatry: normal mood, congruent affect  Labs   I have personally reviewed the following labs and imaging studies CBC    Component Value Date/Time   WBC 8.7 10/13/2023 0214   RBC 2.94 (L) 10/13/2023 0214   HGB 9.1 (L) 10/13/2023 0214   HGB 9.8 (L) 08/28/2023 1408   HGB 10.7 (L) 07/03/2022 1549   HCT 27.9 (L) 10/13/2023 0214   HCT 33.1 (L) 07/03/2022 1549   PLT 227 10/13/2023 0214   PLT 278 08/28/2023 1408   PLT 337 07/03/2022 1549   MCV 94.9 10/13/2023 0214   MCV 91 07/03/2022 1549   MCV 96 03/23/2013 0617   MCH 31.0 10/13/2023 0214   MCHC 32.6 10/13/2023 0214   RDW 12.8 10/13/2023 0214   RDW 13.9 07/03/2022 1549   RDW 13.0 03/23/2013 0617   LYMPHSABS 0.8 10/12/2023 2250   LYMPHSABS 2.7 03/23/2013 0617   MONOABS 0.8 10/12/2023 2250   MONOABS 1.6 (H) 03/23/2013 0617   EOSABS 0.2 10/12/2023 2250   EOSABS 0.0 03/23/2013 0617   BASOSABS 0.1 10/12/2023 2250   BASOSABS 0.1 03/23/2013 0617      Latest Ref Rng & Units 10/13/2023    2:14 AM 10/12/2023   10:50 PM 07/03/2022    3:49 PM  BMP  Glucose 70 - 99 mg/dL 086  578  469   BUN 8 - 23 mg/dL 18  20  37   Creatinine 0.44 - 1.00 mg/dL 6.29  5.28  4.13   BUN/Creat Ratio 12 - 28   33   Sodium 135 - 145 mmol/L 137  137  142   Potassium 3.5 - 5.1 mmol/L 4.1  4.1  4.4   Chloride 98 - 111 mmol/L 105  104  104   CO2 22 - 32 mmol/L 21  24  21    Calcium  8.9 - 10.3 mg/dL 8.3  8.8  9.3     No results found.  Disposition Plan & Communication  Patient status:  Inpatient  Admitted From: Home Planned disposition location: Home health Anticipated discharge date: 6/3 pending O2 weaning or home health O2 if not by tomorrow   Family Communication: grandson at bedside    Author: Ree Candy, DO [facility] 10/15/2023, 8:45 AM   Available by Epic secure chat 7AM-7PM. If 7PM-7AM, please contact night-coverage.  [facility] contact information found on [email or web address].

## 2023-10-15 NOTE — Progress Notes (Signed)
*  PRELIMINARY RESULTS* Echocardiogram 2D Echocardiogram has been performed.  Kathryn Garrett 10/15/2023, 1:10 PM

## 2023-10-15 NOTE — Progress Notes (Signed)
 Physical Therapy Treatment Patient Details Name: Kathryn Garrett MRN: [id number] DOB: [birth year] Today's Date: 10/15/2023   History of Present Illness Kathryn Garrett is a 78 y.o. female with medical history significant for osteoarthritis, coronary artery disease, chronic systolic and diastolic CHF, COPD, hypertension, dyslipidemia, who presented to the emergency room with acute onset of malaise over the last couple of days with associated cough that is mostly dry with dyspnea and wheezing.    PT Comments  Pt resting in bed upon PT arrival; pt agreeable to therapy.  Pt able to ambulate around nursing loop x 2 trials.  Pt on 1 L supplemental O2 throughout session.  1st trial ambulating pt's SpO2 sats fluctuating between 86-91% with HR 107 bpm; 2nd trial ambulating pt's SpO2 sats 88-94% with HR mostly 105 bpm (increased to 114 very end of ambulation/once sitting).  Increased work of breathing noted after 2nd trial ambulating but pt recovered well with sitting rest.  Will continue to focus on endurance and progressive functional mobility during hospitalization.    If plan is discharge home, recommend the following: Help with stairs or ramp for entrance;Assist for transportation   Can travel by private vehicle        Equipment Recommendations  None recommended by PT    Recommendations for Other Services       Precautions / Restrictions Precautions Precautions: Fall Recall of Precautions/Restrictions: Intact Precaution/Restrictions Comments: Monitor O2 Restrictions Weight Bearing Restrictions Per Provider Order: No     Mobility  Bed Mobility Overal bed mobility: Independent             General bed mobility comments: No difficulties noted supine to sitting EOB    Transfers Overall transfer level: Independent Equipment used: None               General transfer comment: steady transfer from bed x2 trials    Ambulation/Gait Ambulation/Gait assistance:  Supervision Gait Distance (Feet):  (190 feet x2) Assistive device: None Gait Pattern/deviations: Step-through pattern       General Gait Details: steady ambulating   Stairs             Wheelchair Mobility     Tilt Bed    Modified Rankin (Stroke Patients Only)       Balance Overall balance assessment: No apparent balance deficits (not formally assessed)                                          Communication Communication Communication: No apparent difficulties  Cognition Arousal: Alert Behavior During Therapy: WFL for tasks assessed/performed   PT - Cognitive impairments: No apparent impairments                         Following commands: Intact      Cueing Cueing Techniques: Verbal cues  Exercises      General Comments  Nursing cleared pt for participation in physical therapy.  Pt agreeable to PT session.  Pt's grandson present during session.      Pertinent Vitals/Pain Pain Assessment Pain Assessment: No/denies pain    Home Living                          Prior Function            PT Goals (current goals can now be found  in the care plan section) Acute Rehab PT Goals Patient Stated Goal: improve her breathing PT Goal Formulation: With patient Time For Goal Achievement: 10/28/23 Potential to Achieve Goals: Good Progress towards PT goals: Progressing toward goals    Frequency    Min 1X/week      PT Plan      Co-evaluation              AM-PAC PT "6 Clicks" Mobility   Outcome Measure  Help needed turning from your back to your side while in a flat bed without using bedrails?: None Help needed moving from lying on your back to sitting on the side of a flat bed without using bedrails?: None Help needed moving to and from a bed to a chair (including a wheelchair)?: None Help needed standing up from a chair using your arms (e.g., wheelchair or bedside chair)?: None Help needed to walk in  hospital room?: None Help needed climbing 3-5 steps with a railing? : A Little 6 Click Score: 23    End of Session Equipment Utilized During Treatment: Oxygen  Activity Tolerance: Patient tolerated treatment well Patient left: with call bell/phone within reach;with bed alarm set;with family/visitor present (sitting on EOB; grandson present) Nurse Communication: Mobility status;Other (comment) (SpO2 sats and HR during session) PT Visit Diagnosis: Other abnormalities of gait and mobility (R26.89);Muscle weakness (generalized) (M62.81)     Time: 1610-9604 PT Time Calculation (min) (ACUTE ONLY): 18 min  Charges:    $Therapeutic Exercise: 8-22 mins PT General Charges $$ ACUTE PT VISIT: 1 Visit                     Amador Junes, PT 10/15/23, 4:27 PM

## 2023-10-15 NOTE — TOC Progression Note (Signed)
 Transition of Care [facility]) - Progression Note    Patient Details  Name: HARSHITA BERNALES MRN: [id number] Date of Birth: 07/15/1945  Transition [facility]) CM/SW Contact  Loman Risk, RN Phone Number: 10/15/2023, 3:59 PM  Clinical Narrative:     Per [place]  with [facility] they are able to accept for RN and PT with start of care for this Friday or Saturday        Expected Discharge Plan and Services                                               Social Determinants of Health (SDOH) Interventions SDOH Screenings   Food Insecurity: No Food Insecurity (10/13/2023)  Housing: Low Risk  (10/13/2023)  Transportation Needs: No Transportation Needs (10/13/2023)  Utilities: Not At Risk (10/13/2023)  Depression (PHQ2-9): Low Risk  (08/28/2023)  Financial Resource Strain: Low Risk  (08/28/2023)  Social Connections: Moderately Isolated (10/13/2023)  Tobacco Use: Medium Risk (10/12/2023)    Readmission Risk Interventions     No data to display

## 2023-10-15 NOTE — Care Management Important Message (Signed)
 Important Message  Patient Details  Name: Kathryn Garrett MRN: [id number] Date of Birth: [birth year]   Important Message Given:  Yes - Medicare IM     Anise Kerns 10/15/2023, 12:52 PM

## 2023-10-16 DIAGNOSIS — J189 Pneumonia, unspecified organism: Secondary | ICD-10-CM | POA: Diagnosis not present

## 2023-10-16 DIAGNOSIS — R0902 Hypoxemia: Secondary | ICD-10-CM | POA: Diagnosis not present

## 2023-10-16 DIAGNOSIS — R509 Fever, unspecified: Secondary | ICD-10-CM | POA: Diagnosis not present

## 2023-10-16 DIAGNOSIS — A419 Sepsis, unspecified organism: Secondary | ICD-10-CM | POA: Diagnosis not present

## 2023-10-16 MED ORDER — LEVOFLOXACIN 750 MG PO TABS: 750.0000 mg | ORAL_TABLET | Freq: Every day | ORAL | 0 refills | Status: AC

## 2023-10-16 MED ORDER — CEPHALEXIN 500 MG PO CAPS: 500.0000 mg | ORAL_CAPSULE | Freq: Two times a day (BID) | ORAL | 0 refills | Status: DC

## 2023-10-16 MED ORDER — IPRATROPIUM-ALBUTEROL 0.5-2.5 (3) MG/3ML IN SOLN
3.0000 mL | Freq: Three times a day (TID) | RESPIRATORY_TRACT | Status: DC
  Administered 2023-10-16: 3 mL via RESPIRATORY_TRACT
  Filled 2023-10-16: qty 3

## 2023-10-16 NOTE — Progress Notes (Incomplete)
 PROGRESS NOTE  Kathryn Garrett    DOB: 1946-03-31, 78 y.o.  ZOX:[id number]    Code Status: Full Code   DOA: 10/12/2023   LOS: 3   Brief hospital course  Kathryn Garrett is a 78 y.o. female with medical history significant for osteoarthritis, coronary artery disease, chronic systolic and diastolic CHF, COPD, hypertension, dyslipidemia, who presented to the emergency room with acute onset of malaise over the last couple of days with associated cough that is mostly dry with dyspnea and wheezing.   ED Course: temp 102 and blood pressure 142/74 with heart rate 115, respiratory rate of 15 and later 26 and pulse ox  88% on room air and 98% on 2 L of O2 by nasal cannula.  Labs revealed unremarkable CMP.  BNP was two 5.5 and high-sensitivity troponin was 10 and later 15.  Lactic acid was 1.2 later 1.1.  CBC showed WBCs of 10.6 with neutrophilia as well as mild anemia better than previous levels.  INR is 1.3 and PT 16.7.  Respiratory panel came back negative.  UA was unremarkable. EKG: sinus tachycardia with rate 105 with PACs and low voltage QRS. Imaging: chest x-ray: Minimal patchy opacities in the retrocardiac region with central peribronchial wall thickening bilaterally. Findings may be due to bronchitis or developing pneumonia.   The patient was given IV cefepime , Flagyl  and vancomycin , Tylenol  975 mg p.o. and 1.75 L of IV lactated Ringer .   Patient was admitted to medicine service for further workup and management of sepsis 2/2 PNA as outlined in detail below.  10/16/23 -stable on 1L  Assessment & Plan  Principal Problem:   Sepsis due to pneumonia [facility]) Active Problems:   HFrEF (heart failure with reduced ejection fraction) (HCC)   Hypothyroidism   Essential hypertension   Coronary artery disease  Sepsis due to pneumonia (HCC)- BxCx NGTD Hypoxia with h/o COPD H/o smoking in the past - Sepsis manifested by fever, tachycardia and tachypnea. Criteria have resolved - continue antibiotic  therapy with IV Rocephin  - discontinuing Doxycycline  as patient feels this is making her nauseous  - Mucolytic therapy and bronchodilator therapy will be provided. - wean O2 as tolerated. Still desats to 88% ORA while resting and when returned her 1L [place] she improved to 91%. She was asymptomatic. Unsure of her tolerance with exertion but would likely need O2 at home if went today. We discussed another day to try to wean back to room air so we can hopefully avoid having to go home with O2.  - tomorrow ambulate with pulse ox for final assessment for O2 at dc.  - f/u repeat echo today to r/u CHF contribution. Last echo 2023 showed overall normal results UPDATE: echo overall unchanged.    CAD  HFrEF - continue Plavix , Toprol -XL, as needed sublingual ,statin therapy and ARB therapy. - f/u echo - BID lasix , monitor UOP   Essential hypertension  low diastolics, widened pulse pressure. May need to allow higher goal systolics to avoid orthostatic hypotension.  - continue antihypertensive therapy.   Hypothyroidism - continue Synthroid .   Body mass index is 28.5 kg/m.  VTE ppx: enoxaparin  (LOVENOX ) injection 40 mg Start: 10/13/23 1000  Diet:     Diet   Diet Heart Room service appropriate? Yes; Fluid consistency: Thin   Consultants: None   Subjective 10/16/23    Pt reports feels better. She wants to go home today. Is agreeable to staying another day since she is concerned that she threw up her medications yesterday  and would prefer to wean from O2 prior to dc.    Objective   Blood pressure (!) 135/52, pulse 82, temperature 98.5 F (36.9 C), temperature source Oral, resp. rate 17, height 4\' 11"  (1.499 m), weight 64 kg, SpO2 95%.  Intake/Output Summary (Last 24 hours) at 10/16/2023 0734 Last data filed at 10/15/2023 1636 Gross per 24 hour  Intake 100 ml  Output --  Net 100 ml   Filed Weights   10/12/23 2246 10/13/23 0203  Weight: 50.3 kg 64 kg     Physical Exam:  General: awake,  alert, NAD HEENT: atraumatic, clear conjunctiva, anicteric sclera, MMM, hearing grossly normal Respiratory: normal respiratory effort. Inspiratory wheezing worse on left Cardiovascular: quick capillary refill, normal S1/S2, RRR, no JVD, murmurs Nervous: A&O x3. no gross focal neurologic deficits, normal speech Extremities: moves all equally, no edema, normal tone Skin: dry, intact, normal temperature, normal color. No rashes, lesions or ulcers on exposed skin Psychiatry: normal mood, congruent affect  Labs   I have personally reviewed the following labs and imaging studies CBC    Component Value Date/Time   WBC 8.7 10/13/2023 0214   RBC 2.94 (L) 10/13/2023 0214   HGB 9.1 (L) 10/13/2023 0214   HGB 9.8 (L) 08/28/2023 1408   HGB 10.7 (L) 07/03/2022 1549   HCT 27.9 (L) 10/13/2023 0214   HCT 33.1 (L) 07/03/2022 1549   PLT 227 10/13/2023 0214   PLT 278 08/28/2023 1408   PLT 337 07/03/2022 1549   MCV 94.9 10/13/2023 0214   MCV 91 07/03/2022 1549   MCV 96 03/23/2013 0617   MCH 31.0 10/13/2023 0214   MCHC 32.6 10/13/2023 0214   RDW 12.8 10/13/2023 0214   RDW 13.9 07/03/2022 1549   RDW 13.0 03/23/2013 0617   LYMPHSABS 0.8 10/12/2023 2250   LYMPHSABS 2.7 03/23/2013 0617   MONOABS 0.8 10/12/2023 2250   MONOABS 1.6 (H) 03/23/2013 0617   EOSABS 0.2 10/12/2023 2250   EOSABS 0.0 03/23/2013 0617   BASOSABS 0.1 10/12/2023 2250   BASOSABS 0.1 03/23/2013 0617      Latest Ref Rng & Units 10/13/2023    2:14 AM 10/12/2023   10:50 PM 07/03/2022    3:49 PM  BMP  Glucose 70 - 99 mg/dL 161  096  045   BUN 8 - 23 mg/dL 18  20  37   Creatinine 0.44 - 1.00 mg/dL 4.09  8.11  9.14   BUN/Creat Ratio 12 - 28   33   Sodium 135 - 145 mmol/L 137  137  142   Potassium 3.5 - 5.1 mmol/L 4.1  4.1  4.4   Chloride 98 - 111 mmol/L 105  104  104   CO2 22 - 32 mmol/L 21  24  21    Calcium  8.9 - 10.3 mg/dL 8.3  8.8  9.3     ECHOCARDIOGRAM COMPLETE Result Date: 10/15/2023    ECHOCARDIOGRAM REPORT   Patient  Name:   Kathryn Garrett Date of Exam: 10/15/2023 Medical Rec #:  [id number]         Height:       59.0 in Accession #:    [id number]        Weight:       141.1 lb Date of Birth:  July 10, 1945         BSA:          1.590 m Patient Age:    77 years          BP:  135/52 mmHg Patient Gender: F                 HR:           82 bpm. Exam Location:  [facility] Procedure: 2D Echo, Cardiac Doppler and Color Doppler (Both Spectral and Color            Flow Doppler were utilized during procedure). Indications:     Congestive heart failure I50.9  History:         Patient has prior history of Echocardiogram examinations, most                  recent 11/24/2020. CAD, COPD, Signs/Symptoms:Chest Pain; Risk                  Factors:Hypertension.  Sonographer:     Broadus Canes Referring Phys:  [id number] SONA PATEL Diagnosing Phys: Antionette Kirks MD IMPRESSIONS  1. Left ventricular ejection fraction, by estimation, is 55 to 60%. The left ventricle has normal function. The left ventricle has no regional wall motion abnormalities. There is mild left ventricular hypertrophy. Left ventricular diastolic parameters are consistent with Grade I diastolic dysfunction (impaired relaxation).  2. Right ventricular systolic function is normal. The right ventricular size is normal. Tricuspid regurgitation signal is inadequate for assessing PA pressure.  3. The mitral valve is normal in structure. No evidence of mitral valve regurgitation. No evidence of mitral stenosis. Moderate mitral annular calcification.  4. The aortic valve is normal in structure. Aortic valve regurgitation is not visualized. No aortic stenosis is present.  5. The inferior vena cava is normal in size with greater than 50% respiratory variability, suggesting right atrial pressure of 3 mmHg. FINDINGS  Left Ventricle: Left ventricular ejection fraction, by estimation, is 55 to 60%. The left ventricle has normal function. The left ventricle has no regional wall motion abnormalities. The  left ventricular internal cavity size was normal in size. There is  mild left ventricular hypertrophy. Left ventricular diastolic parameters are consistent with Grade I diastolic dysfunction (impaired relaxation). Right Ventricle: The right ventricular size is normal. No increase in right ventricular wall thickness. Right ventricular systolic function is normal. Tricuspid regurgitation signal is inadequate for assessing PA pressure. The tricuspid regurgitant velocity is 1.88 m/s, and with an assumed right atrial pressure of 3 mmHg, the estimated right ventricular systolic pressure is 17.1 mmHg. Left Atrium: Left atrial size was normal in size. Right Atrium: Right atrial size was normal in size. Pericardium: There is no evidence of pericardial effusion. Mitral Valve: The mitral valve is normal in structure. Moderate mitral annular calcification. No evidence of mitral valve regurgitation. No evidence of mitral valve stenosis. MV peak gradient, 8.9 mmHg. The mean mitral valve gradient is 4.0 mmHg. Tricuspid Valve: The tricuspid valve is normal in structure. Tricuspid valve regurgitation is not demonstrated. No evidence of tricuspid stenosis. Aortic Valve: The aortic valve is normal in structure. Aortic valve regurgitation is not visualized. No aortic stenosis is present. Aortic valve mean gradient measures 4.0 mmHg. Aortic valve peak gradient measures 8.0 mmHg. Aortic valve area, by VTI measures 3.42 cm. Pulmonic Valve: The pulmonic valve was normal in structure. Pulmonic valve regurgitation is not visualized. No evidence of pulmonic stenosis. Aorta: The aortic root is normal in size and structure. Venous: The inferior vena cava is normal in size with greater than 50% respiratory variability, suggesting right atrial pressure of 3 mmHg. IAS/Shunts: No atrial level shunt detected by color flow Doppler.  LEFT VENTRICLE PLAX  2D LVIDd:         3.60 cm LVIDs:         2.60 cm LV PW:         1.10 cm LV IVS:        1.40 cm  LVOT diam:     2.00 cm LV SV:         82 LV SV Index:   52 LVOT Area:     3.14 cm  RIGHT VENTRICLE RV Basal diam:  2.70 cm RV Mid diam:    2.60 cm RV S prime:     15.70 cm/s TAPSE (M-mode): 1.8 cm LEFT ATRIUM             Index        RIGHT ATRIUM           Index LA diam:        3.10 cm 1.95 cm/m   RA Area:     11.20 cm LA Vol (A2C):   19.2 ml 12.07 ml/m  RA Volume:   22.90 ml  14.40 ml/m LA Vol (A4C):   20.8 ml 13.08 ml/m LA Biplane Vol: 21.8 ml 13.71 ml/m  AORTIC VALVE AV Area (Vmax):    2.55 cm AV Area (Vmean):   2.81 cm AV Area (VTI):     3.42 cm AV Vmax:           141.50 cm/s AV Vmean:          90.500 cm/s AV VTI:            0.240 m AV Peak Grad:      8.0 mmHg AV Mean Grad:      4.0 mmHg LVOT Vmax:         115.00 cm/s LVOT Vmean:        81.000 cm/s LVOT VTI:          0.261 m LVOT/AV VTI ratio: 1.09  AORTA Ao Root diam: 2.90 cm MITRAL VALVE                TRICUSPID VALVE MV Area (PHT): 2.97 cm     TR Peak grad:   14.1 mmHg MV Area VTI:   3.07 cm     TR Vmax:        188.00 cm/s MV Peak grad:  8.9 mmHg MV Mean grad:  4.0 mmHg     SHUNTS MV Vmax:       1.49 m/s     Systemic VTI:  0.26 m MV Vmean:      90.5 cm/s    Systemic Diam: 2.00 cm MV Decel Time: 255 msec MV E velocity: 73.40 cm/s MV A velocity: 125.00 cm/s MV E/A ratio:  0.59 Antionette Kirks MD Electronically signed by Antionette Kirks MD Signature Date/Time: 10/15/2023/3:35:57 PM    Final     Disposition Plan & Communication  Patient status: Inpatient  Admitted From: Home Planned disposition location: Home health Anticipated discharge date: 6/3 pending O2 weaning or home health O2 if not by tomorrow   Family Communication: grandson at bedside    Author: Ree Candy, DO [facility] 10/16/2023, 7:34 AM   Available by Epic secure chat 7AM-7PM. If 7PM-7AM, please contact night-coverage.  [facility] contact information found on [email or web address].

## 2023-10-16 NOTE — Plan of Care (Signed)
  Problem: Fluid Volume: Goal: Hemodynamic stability will improve 10/16/2023 1047 by Franciso Irwin, RN Outcome: Adequate for Discharge 10/16/2023 1047 by Franciso Irwin, RN Outcome: Progressing   Problem: Clinical Measurements: Goal: Diagnostic test results will improve 10/16/2023 1047 by Franciso Irwin, RN Outcome: Adequate for Discharge 10/16/2023 1047 by Franciso Irwin, RN Outcome: Progressing Goal: Signs and symptoms of infection will decrease 10/16/2023 1047 by Franciso Irwin, RN Outcome: Adequate for Discharge 10/16/2023 1047 by Franciso Irwin, RN Outcome: Progressing   Problem: Respiratory: Goal: Ability to maintain adequate ventilation will improve 10/16/2023 1047 by Franciso Irwin, RN Outcome: Adequate for Discharge 10/16/2023 1047 by Franciso Irwin, RN Outcome: Progressing   Problem: Education: Goal: Knowledge of General Education information will improve Description: Including pain rating scale, medication(s)/side effects and non-pharmacologic comfort measures 10/16/2023 1047 by Franciso Irwin, RN Outcome: Adequate for Discharge 10/16/2023 1047 by Franciso Irwin, RN Outcome: Progressing   Problem: Health Behavior/Discharge Planning: Goal: Ability to manage health-related needs will improve 10/16/2023 1047 by Franciso Irwin, RN Outcome: Adequate for Discharge 10/16/2023 1047 by Franciso Irwin, RN Outcome: Progressing   Problem: Clinical Measurements: Goal: Ability to maintain clinical measurements within normal limits will improve 10/16/2023 1047 by Franciso Irwin, RN Outcome: Adequate for Discharge 10/16/2023 1047 by Franciso Irwin, RN Outcome: Progressing Goal: Will remain free from infection 10/16/2023 1047 by Franciso Irwin, RN Outcome: Adequate for Discharge 10/16/2023 1047 by Franciso Irwin, RN Outcome: Progressing Goal: Diagnostic test results will improve 10/16/2023  1047 by Franciso Irwin, RN Outcome: Adequate for Discharge 10/16/2023 1047 by Franciso Irwin, RN Outcome: Progressing Goal: Respiratory complications will improve 10/16/2023 1047 by Franciso Irwin, RN Outcome: Adequate for Discharge 10/16/2023 1047 by Franciso Irwin, RN Outcome: Progressing Goal: Cardiovascular complication will be avoided 10/16/2023 1047 by Franciso Irwin, RN Outcome: Adequate for Discharge 10/16/2023 1047 by Franciso Irwin, RN Outcome: Progressing   Problem: Activity: Goal: Risk for activity intolerance will decrease 10/16/2023 1047 by Franciso Irwin, RN Outcome: Adequate for Discharge 10/16/2023 1047 by Franciso Irwin, RN Outcome: Progressing   Problem: Nutrition: Goal: Adequate nutrition will be maintained 10/16/2023 1047 by Franciso Irwin, RN Outcome: Adequate for Discharge 10/16/2023 1047 by Franciso Irwin, RN Outcome: Progressing   Problem: Coping: Goal: Level of anxiety will decrease 10/16/2023 1047 by Franciso Irwin, RN Outcome: Adequate for Discharge 10/16/2023 1047 by Franciso Irwin, RN Outcome: Progressing   Problem: Elimination: Goal: Will not experience complications related to bowel motility 10/16/2023 1047 by Franciso Irwin, RN Outcome: Adequate for Discharge 10/16/2023 1047 by Franciso Irwin, RN Outcome: Progressing Goal: Will not experience complications related to urinary retention 10/16/2023 1047 by Franciso Irwin, RN Outcome: Adequate for Discharge 10/16/2023 1047 by Franciso Irwin, RN Outcome: Progressing   Problem: Pain Managment: Goal: General experience of comfort will improve and/or be controlled 10/16/2023 1047 by Franciso Irwin, RN Outcome: Adequate for Discharge 10/16/2023 1047 by Franciso Irwin, RN Outcome: Progressing   Problem: Safety: Goal: Ability to remain free from injury will improve 10/16/2023 1047 by Franciso Irwin, RN Outcome: Adequate for Discharge 10/16/2023 1047 by Franciso Irwin, RN Outcome: Progressing   Problem: Skin Integrity: Goal: Risk for impaired skin integrity will decrease 10/16/2023 1047 by Franciso Irwin, RN Outcome: Adequate for Discharge 10/16/2023 1047 by Franciso Irwin, RN Outcome: Progressing

## 2023-10-16 NOTE — TOC Progression Note (Signed)
 Transition of Care [facility]) - Progression Note    Patient Details  Name: Kathryn Garrett MRN: [id number] Date of Birth: 1945-07-14  [facility]) CM/SW Contact  Loman Risk, RN Phone Number: 10/16/2023, 10:25 AM  Clinical Narrative:     Per Dietra Fraction patient has a O2 concentrator and portable tanks. Confirmed with [employer] that patient has an order for continuous O2 at home  Update:  Patient to discharge today.  [place]  with [facility] notified of DC       Expected Discharge Plan and Services                                               Social Determinants of Health (SDOH) Interventions SDOH Screenings   Food Insecurity: No Food Insecurity (10/13/2023)  Housing: Low Risk  (10/13/2023)  Transportation Needs: No Transportation Needs (10/13/2023)  Utilities: Not At Risk (10/13/2023)  Depression (PHQ2-9): Low Risk  (08/28/2023)  Financial Resource Strain: Low Risk  (08/28/2023)  Social Connections: Moderately Isolated (10/13/2023)  Tobacco Use: Medium Risk (10/12/2023)    Readmission Risk Interventions     No data to display

## 2023-10-16 NOTE — Discharge Summary (Signed)
 Physician Discharge Summary  Patient: Kathryn Garrett BMW:[id number] DOB: [birth year]   Code Status: Full Code Admit date: 10/12/2023 Discharge date: 10/16/2023 Disposition: Home, No home health services recommended PCP: Kathryn Sensing, NP  Recommendations for Outpatient Follow-up:  Follow up with PCP within 1-2 weeks Regarding general Garrett follow up and preventative care Recommend evaluating if able to wean from O2 Need levothyroxine ? Check TSH Follow up with pulmonology   Discharge Diagnoses:  Principal Problem:   Sepsis due to pneumonia Kathryn Garrett) Active Problems:   HFrEF (heart failure with reduced ejection fraction) (HCC)   Hypothyroidism   Essential hypertension   Coronary artery disease   Fever   Hypoxia  Brief Garrett Course Summary: Kathryn Garrett is a 78 y.o. female with medical history significant for osteoarthritis, coronary artery disease, chronic systolic and diastolic CHF, COPD, hypertension, dyslipidemia, who presented to the emergency room with acute onset of malaise over the last couple of days with associated cough that is mostly dry with dyspnea and wheezing.   ED Course: temp 102 and blood pressure 142/74 with heart rate 115, respiratory rate of 15 and later 26 and pulse ox  88% on room air and 98% on 2 L of O2 by nasal cannula.  Labs revealed unremarkable CMP.  BNP was two 5.5 and high-sensitivity troponin was 10 and later 15.  Lactic acid was 1.2 later 1.1.  CBC showed WBCs of 10.6 with neutrophilia as well as mild anemia better than previous levels.  INR is 1.3 and PT 16.7.  Respiratory panel came back negative.  UA was unremarkable. EKG: sinus tachycardia with rate 105 with PACs and low voltage QRS. Imaging: chest x-ray: Minimal patchy opacities in the retrocardiac region with central peribronchial wall thickening bilaterally. Findings may be due to bronchitis or developing pneumonia.   The patient was initially given IV cefepime , Flagyl  and vancomycin ,  Tylenol  975 mg p.o. and 1.75 L of IV lactated Ringer  and treatment for sepsis due to pneumonia.  Patient's antibiotics were transitioned to IV Rocephin  plus doxycycline  but she was concerned that the doxycycline  was making her nauseous so this was discontinued.  She continued to improve on the Rocephin  alone and able to wean down to 1 L nasal cannula.  On day of discharge, she was ambulated with O2 and required 2 L with exertion and 1 L at rest.  She felt back to her baseline and ready to go home.  Of note, on admission her levothyroxine  was continued but this appears to be a historical med that she was not currently taking outpatient so was not continued at discharge.  Would recommend repeating TSH at a time when she is resolved back to her baseline.  All other chronic conditions were treated with home medications.    Discharge Condition: Good, improved Recommended discharge diet: Regular healthy diet  Consultations: None   Procedures/Studies: Echo   Allergies as of 10/16/2023       Reactions   Pregabalin Other (See Comments)   'Makes me extremely sedated'   Azithromycin  Rash   Doxycycline  Nausea Only        Medication List     STOP taking these medications    levothyroxine  88 MCG tablet Commonly known as: SYNTHROID    meloxicam 7.5 MG tablet Commonly known as: MOBIC   Mounjaro 5 MG/0.5ML Pen Generic drug: tirzepatide   ondansetron  4 MG tablet Commonly known as: ZOFRAN        TAKE these medications    acetaminophen  500  MG tablet Commonly known as: TYLENOL  Take 1,000 mg by mouth every 6 (six) hours as needed for headache.   albuterol  108 (90 Base) MCG/ACT inhaler Commonly known as: VENTOLIN  HFA Inhale 2 puffs into the lungs every 4 (four) hours as needed for wheezing or shortness of breath.   aspirin  EC 81 MG tablet Take 81 mg by mouth at bedtime.   azelastine  0.1 % nasal spray Commonly known as: ASTELIN  Place 2 sprays into both nostrils daily.    Buprenorphine  HCl-Naloxone  HCl 8-2 MG Film Place 0.5 Film under the tongue daily.   cetirizine  10 MG tablet Commonly known as: ZYRTEC  Take 1 tablet (10 mg total) by mouth daily.   clopidogrel  75 MG tablet Commonly known as: PLAVIX  Take 1 tablet (75 mg total) by mouth daily.   estradiol 0.1 MG/GM vaginal cream Commonly known as: ESTRACE Place 1 Applicatorful vaginally.   furosemide  20 MG tablet Commonly known as: LASIX  Take 1 tablet (20 mg total) by mouth daily. MAY TAKE AN EXTRA 20 MG AS NEEDED FOR 2 LB WEIGHT GAIN IN 24 OR 5 LB WEIGHT GAIN IN 5 DAYS What changed: additional instructions   levofloxacin  750 MG tablet Commonly known as: Levaquin  Take 1 tablet (750 mg total) by mouth daily for 4 days.   losartan  50 MG tablet Commonly known as: COZAAR  Take 1 tablet (50 mg total) by mouth daily.   metoprolol  succinate 50 MG 24 hr tablet Commonly known as: TOPROL -XL Take 1 tablet (50 mg total) by mouth daily. Take with or immediately following a meal.   nitroGLYCERIN  0.4 MG SL tablet Commonly known as: NITROSTAT  Place 1 tablet (0.4 mg total) under the tongue every 5 (five) minutes as needed for chest pain.   nystatin  cream Commonly known as: MYCOSTATIN  Apply topically 2 (two) times daily.   rosuvastatin  20 MG tablet Commonly known as: CRESTOR  Take 1 tablet (20 mg total) by mouth daily.   Trelegy Ellipta 100-62.5-25 MCG/INH Aepb Generic drug: Fluticasone -Umeclidin-Vilant Take 1 puff by mouth daily.   triamcinolone cream 0.1 % Commonly known as: KENALOG Apply 1 application topically 2 (two) times daily. to affected area               Durable Medical Equipment  (From admission, onward)           Start     Ordered   10/16/23 0955  For home use only DME oxygen   Once       Question Answer Comment  Length of Need 6 Months   Mode or (Route) Nasal cannula   Liters per Minute 2   Frequency Continuous (stationary and portable oxygen  unit needed)   Oxygen   conserving device Yes   Oxygen  delivery system Gas      10/16/23 0954   10/14/23 1417  For home use only DME oxygen   Once       Question Answer Comment  Length of Need 6 Months   Mode or (Route) Nasal cannula   Liters per Minute 2   Frequency Continuous (stationary and portable oxygen  unit needed)   Oxygen  conserving device Yes   Oxygen  delivery system Gas      10/14/23 1417            Follow-up Information     Kathryn Sensing, NP. Go on 10/24/2023.   Why: Go @ 11:20am. Contact information: [address] [place] [address] [phone]  Subjective   Pt reports feeling so much better today. Denies SOB at rest. She is tolerating a diet and ambulation in room.   All questions and concerns were addressed at time of discharge.  Objective  Blood pressure 125/65, pulse 72, temperature 97.7 F (36.5 C), temperature source Oral, resp. rate 17, height 4\' 11"  (1.499 m), weight 64 kg, SpO2 94%.   General: Pt is alert, awake, not in acute distress Cardiovascular: RRR, S1/S2 +, no rubs, no gallops Respiratory: CTA bilaterally, no wheezing, no rhonchi Abdominal: Soft, NT, ND, bowel sounds + Extremities: no edema, no cyanosis  The results of significant diagnostics from this hospitalization (including imaging, microbiology, ancillary and laboratory) are listed below for reference.   Imaging studies: ECHOCARDIOGRAM COMPLETE Result Date: 10/15/2023    ECHOCARDIOGRAM REPORT   Patient Name:   AKEIA PEROT Date of Exam: 10/15/2023 Medical Rec #:  [id number]         Height:       59.0 in Accession #:    [id number]        Weight:       141.1 lb Date of Birth:  [birth year]         BSA:          1.590 m Patient Age:    77 years          BP:           135/52 mmHg Patient Gender: F                 HR:           82 bpm. Exam Location:  [facility] Procedure: 2D Echo, Cardiac Doppler and Color Doppler (Both Spectral and Color            Flow Doppler were utilized during  procedure). Indications:     Congestive heart failure I50.9  History:         Patient has prior history of Echocardiogram examinations, most                  recent 11/24/2020. CAD, COPD, Signs/Symptoms:Chest Pain; Risk                  Factors:Hypertension.  Sonographer:     Broadus Canes Referring Phys:  [id number] SONA PATEL Diagnosing Phys: Antionette Kirks MD IMPRESSIONS  1. Left ventricular ejection fraction, by estimation, is 55 to 60%. The left ventricle has normal function. The left ventricle has no regional wall motion abnormalities. There is mild left ventricular hypertrophy. Left ventricular diastolic parameters are consistent with Grade I diastolic dysfunction (impaired relaxation).  2. Right ventricular systolic function is normal. The right ventricular size is normal. Tricuspid regurgitation signal is inadequate for assessing PA pressure.  3. The mitral valve is normal in structure. No evidence of mitral valve regurgitation. No evidence of mitral stenosis. Moderate mitral annular calcification.  4. The aortic valve is normal in structure. Aortic valve regurgitation is not visualized. No aortic stenosis is present.  5. The inferior vena cava is normal in size with greater than 50% respiratory variability, suggesting right atrial pressure of 3 mmHg. FINDINGS  Left Ventricle: Left ventricular ejection fraction, by estimation, is 55 to 60%. The left ventricle has normal function. The left ventricle has no regional wall motion abnormalities. The left ventricular internal cavity size was normal in size. There is  mild left ventricular hypertrophy. Left ventricular diastolic parameters are consistent with Grade I diastolic dysfunction (impaired relaxation). Right Ventricle: The  right ventricular size is normal. No increase in right ventricular wall thickness. Right ventricular systolic function is normal. Tricuspid regurgitation signal is inadequate for assessing PA pressure. The tricuspid regurgitant velocity is 1.88  m/s, and with an assumed right atrial pressure of 3 mmHg, the estimated right ventricular systolic pressure is 17.1 mmHg. Left Atrium: Left atrial size was normal in size. Right Atrium: Right atrial size was normal in size. Pericardium: There is no evidence of pericardial effusion. Mitral Valve: The mitral valve is normal in structure. Moderate mitral annular calcification. No evidence of mitral valve regurgitation. No evidence of mitral valve stenosis. MV peak gradient, 8.9 mmHg. The mean mitral valve gradient is 4.0 mmHg. Tricuspid Valve: The tricuspid valve is normal in structure. Tricuspid valve regurgitation is not demonstrated. No evidence of tricuspid stenosis. Aortic Valve: The aortic valve is normal in structure. Aortic valve regurgitation is not visualized. No aortic stenosis is present. Aortic valve mean gradient measures 4.0 mmHg. Aortic valve peak gradient measures 8.0 mmHg. Aortic valve area, by VTI measures 3.42 cm. Pulmonic Valve: The pulmonic valve was normal in structure. Pulmonic valve regurgitation is not visualized. No evidence of pulmonic stenosis. Aorta: The aortic root is normal in size and structure. Venous: The inferior vena cava is normal in size with greater than 50% respiratory variability, suggesting right atrial pressure of 3 mmHg. IAS/Shunts: No atrial level shunt detected by color flow Doppler.  LEFT VENTRICLE PLAX 2D LVIDd:         3.60 cm LVIDs:         2.60 cm LV PW:         1.10 cm LV IVS:        1.40 cm LVOT diam:     2.00 cm LV SV:         82 LV SV Index:   52 LVOT Area:     3.14 cm  RIGHT VENTRICLE RV Basal diam:  2.70 cm RV Mid diam:    2.60 cm RV S prime:     15.70 cm/s TAPSE (M-mode): 1.8 cm LEFT ATRIUM             Index        RIGHT ATRIUM           Index LA diam:        3.10 cm 1.95 cm/m   RA Area:     11.20 cm LA Vol (A2C):   19.2 ml 12.07 ml/m  RA Volume:   22.90 ml  14.40 ml/m LA Vol (A4C):   20.8 ml 13.08 ml/m LA Biplane Vol: 21.8 ml 13.71 ml/m  AORTIC VALVE  AV Area (Vmax):    2.55 cm AV Area (Vmean):   2.81 cm AV Area (VTI):     3.42 cm AV Vmax:           141.50 cm/s AV Vmean:          90.500 cm/s AV VTI:            0.240 m AV Peak Grad:      8.0 mmHg AV Mean Grad:      4.0 mmHg LVOT Vmax:         115.00 cm/s LVOT Vmean:        81.000 cm/s LVOT VTI:          0.261 m LVOT/AV VTI ratio: 1.09  AORTA Ao Root diam: 2.90 cm MITRAL VALVE  TRICUSPID VALVE MV Area (PHT): 2.97 cm     TR Peak grad:   14.1 mmHg MV Area VTI:   3.07 cm     TR Vmax:        188.00 cm/s MV Peak grad:  8.9 mmHg MV Mean grad:  4.0 mmHg     SHUNTS MV Vmax:       1.49 m/s     Systemic VTI:  0.26 m MV Vmean:      90.5 cm/s    Systemic Diam: 2.00 cm MV Decel Time: 255 msec MV E velocity: 73.40 cm/s MV A velocity: 125.00 cm/s MV E/A ratio:  0.59 Antionette Kirks MD Electronically signed by Antionette Kirks MD Signature Date/Time: 10/15/2023/3:35:57 PM    Final    DG Chest Port 1 View Result Date: 10/12/2023 CLINICAL DATA:  Hypoxia EXAM: PORTABLE CHEST 1 VIEW COMPARISON:  Chest x-ray 05/10/2023 FINDINGS: There are minimal patchy opacities in the retrocardiac region with central peribronchial wall thickening bilaterally. There is no pleural effusion or pneumothorax. Cardiomediastinal silhouette is within normal limits. No acute fractures are seen. IMPRESSION: Minimal patchy opacities in the retrocardiac region with central peribronchial wall thickening bilaterally. Findings may be due to bronchitis or developing pneumonia. Electronically Signed   By: Tyron Gallon M.D.   On: 10/12/2023 23:29   CT Head Wo Contrast Result Date: 10/12/2023 EXAM: CT HEAD WITHOUT TECHNIQUE: CT of the head was performed without the administration of intravenous contrast. Automated exposure control, iterative reconstruction, and/or weight based adjustment of the mA/kV was utilized to reduce the radiation dose to as low as reasonably achievable. COMPARISON: CT Head dated 06/24/23. CLINICAL HISTORY: Mental status  change, unknown cause. Patient arrives via [facility] from home for altered mental status. EMS originally called out for left arm numbness - unknown LKW. Patient also experiencing palpitations. Ems reports patient was unable to stay awake and was losing consciousness while talking. Rash on stomach appeared during ems assessment. 187/89, HR 110, SPO2 94, CBG 145. FINDINGS: BRAIN AND VENTRICLES: Global cortical atrophy. Subcortical and periventricular small vessel ischemic changes. Intracranial atherosclerosis. ORBITS: No acute abnormality is mentioned. 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 SOFT TISSUES AND SKULL: No acute abnormality of the visualized skull or soft tissues. IMPRESSION: 1. No acute intracranial abnormality. 2. Global cortical atrophy and small vessel ischemic changes. Electronically signed by: Zadie Herter MD 10/12/2023 11:28 PM EDT RP Workstation: [id number]    Labs: Basic Metabolic Panel: Recent Labs  Lab 10/12/23 2250 10/13/23 0214  NA 137 137  K 4.1 4.1  CL 104 105  CO2 24 21*  GLUCOSE 104* 113*  BUN 20 18  CREATININE 0.80 0.76  CALCIUM  8.8* 8.3*   CBC: Recent Labs  Lab 10/12/23 2250 10/13/23 0214  WBC 10.6* 8.7  NEUTROABS 8.8*  --   HGB 10.9* 9.1*  HCT 33.5* 27.9*  MCV 95.7 94.9  PLT 283 227   Microbiology: Results for orders placed or performed during the Garrett encounter of 10/12/23  Culture, blood (single)     Status: None (Preliminary result)   Collection Time: 10/12/23 10:50 PM   Specimen: BLOOD  Result Value Ref Range Status   Specimen Description BLOOD RIGHT ANTECUBITAL  Final   Special Requests   Final    BOTTLES DRAWN AEROBIC AND ANAEROBIC Blood Culture results may not be optimal due to an inadequate volume of blood received in culture bottles   Culture   Final    NO GROWTH 4 DAYS  Performed at [facility], [address] [place], [place] [address]    Report Status PENDING  Incomplete  Culture,  blood (routine x 2)     Status: None (Preliminary result)   Collection Time: 10/12/23 10:50 PM   Specimen: BLOOD LEFT HAND  Result Value Ref Range Status   Specimen Description BLOOD LEFT HAND  Final   Special Requests   Final    BOTTLES DRAWN AEROBIC AND ANAEROBIC Blood Culture results may not be optimal due to an inadequate volume of blood received in culture bottles   Culture   Final    NO GROWTH 4 DAYS Performed at [facility], [address] [place], [place] [address]    Report Status PENDING  Incomplete  Resp panel by RT-PCR (RSV, Flu A&B, Covid) Urine, Catheterized     Status: None   Collection Time: 10/13/23 12:01 AM   Specimen: Urine, Catheterized; Nasal Swab  Result Value Ref Range Status   SARS Coronavirus 2 by RT PCR NEGATIVE NEGATIVE Final    Comment: (NOTE) SARS-CoV-2 target nucleic acids are NOT DETECTED.  The SARS-CoV-2 RNA is generally detectable in upper respiratory specimens during the acute phase of infection. The lowest concentration of SARS-CoV-2 viral copies this assay can detect is 138 copies/mL. A negative result does not preclude SARS-Cov-2 infection and should not be used as the sole basis for treatment or other patient management decisions. A negative result may occur with  improper specimen collection/handling, submission of specimen other than nasopharyngeal swab, presence of viral mutation(s) within the areas targeted by this assay, and inadequate number of viral copies(<138 copies/mL). A negative result must be combined with clinical observations, patient history, and epidemiological information. The expected result is Negative.  Fact Sheet for Patients:  BloggerCourse.com  Fact Sheet for Healthcare Providers:  SeriousBroker.it  This test is no t yet approved or cleared by the [place]  FDA and  has been authorized for detection and/or diagnosis of SARS-CoV-2 by FDA under an  Emergency Use Authorization (EUA). This EUA will remain  in effect (meaning this test can be used) for the duration of the COVID-19 declaration under Section 564(b)(1) of the Act, 21 U.S.C.section 360bbb-3(b)(1), unless the authorization is terminated  or revoked sooner.       Influenza A by PCR NEGATIVE NEGATIVE Final   Influenza B by PCR NEGATIVE NEGATIVE Final    Comment: (NOTE) The Xpert Xpress SARS-CoV-2/FLU/RSV plus assay is intended as an aid in the diagnosis of influenza from Nasopharyngeal swab specimens and should not be used as a sole basis for treatment. Nasal washings and aspirates are unacceptable for Xpert Xpress SARS-CoV-2/FLU/RSV testing.  Fact Sheet for Patients: BloggerCourse.com  Fact Sheet for Healthcare Providers: SeriousBroker.it  This test is not yet approved or cleared by the [place]  FDA and has been authorized for detection and/or diagnosis of SARS-CoV-2 by FDA under an Emergency Use Authorization (EUA). This EUA will remain in effect (meaning this test can be used) for the duration of the COVID-19 declaration under Section 564(b)(1) of the Act, 21 U.S.C. section 360bbb-3(b)(1), unless the authorization is terminated or revoked.     Resp Syncytial Virus by PCR NEGATIVE NEGATIVE Final    Comment: (NOTE) Fact Sheet for Patients: BloggerCourse.com  Fact Sheet for Healthcare Providers: SeriousBroker.it  This test is not yet approved or cleared by the [place]  FDA and has been authorized for detection and/or diagnosis of SARS-CoV-2 by FDA under an Emergency Use Authorization (EUA). This EUA will  remain in effect (meaning this test can be used) for the duration of the COVID-19 declaration under Section 564(b)(1) of the Act, 21 U.S.C. section 360bbb-3(b)(1), unless the authorization is terminated or revoked.  Performed at [facility], [address] [place], [place] [address]   Culture, blood (routine x 2)     Status: None (Preliminary result)   Collection Time: 10/13/23  2:14 AM   Specimen: BLOOD  Result Value Ref Range Status   Specimen Description BLOOD BLOOD LEFT ARM  Final   Special Requests   Final    BOTTLES DRAWN AEROBIC AND ANAEROBIC Blood Culture results may not be optimal due to an excessive volume of blood received in culture bottles   Culture   Final    NO GROWTH 3 DAYS Performed at [facility], [address] [place], [place] [address]    Report Status PENDING  Incomplete    Time coordinating discharge: Over 30 minutes  Ree Candy, MD  [facility] 10/16/2023, 11:40 AM

## 2023-10-16 NOTE — Discharge Instructions (Signed)
 Please continue oxygen  throughout the day. Looks like you were able to only need 1L while resting but then increase to 2L when you are walking are exerting yourself.   Please follow up with your primary doctor in about 2 weeks to assess if perhaps you can wean from the oxygen  at that time.

## 2023-10-16 NOTE — Progress Notes (Signed)
 Oxygen  Need Walk Test   Oxygen  sats on RA @ REST 87%  Oxygen  sats on O2 @ REST 93% 2L  Oxygen  sats on RA Ambulating 84%  Oxygen  sats on O2 Ambulating 93% 2L

## 2023-10-17 LAB — CULTURE, BLOOD (ROUTINE X 2): Culture: NO GROWTH

## 2023-10-17 LAB — CULTURE, BLOOD (SINGLE): Culture: NO GROWTH

## 2023-10-18 LAB — CULTURE, BLOOD (ROUTINE X 2): Culture: NO GROWTH

## 2023-10-19 ENCOUNTER — Other Ambulatory Visit (HOSPITAL_BASED_OUTPATIENT_CLINIC_OR_DEPARTMENT_OTHER)

## 2023-11-01 ENCOUNTER — Encounter: Payer: Self-pay | Admitting: Student

## 2023-12-17 ENCOUNTER — Ambulatory Visit: Admitting: Pulmonary Disease

## 2024-01-14 ENCOUNTER — Emergency Department

## 2024-01-14 ENCOUNTER — Other Ambulatory Visit: Payer: Self-pay

## 2024-01-14 ENCOUNTER — Emergency Department
Admission: EM | Admit: 2024-01-14 | Discharge: 2024-01-14 | Disposition: A | Discharge status: Home / Self Care | Attending: Emergency Medicine | Admitting: Emergency Medicine

## 2024-01-14 DIAGNOSIS — I10 Essential (primary) hypertension: Secondary | ICD-10-CM | POA: Insufficient documentation

## 2024-01-14 DIAGNOSIS — Z79899 Other long term (current) drug therapy: Secondary | ICD-10-CM | POA: Insufficient documentation

## 2024-01-14 DIAGNOSIS — R519 Headache, unspecified: Secondary | ICD-10-CM | POA: Diagnosis present

## 2024-01-14 LAB — CBC
HCT: 33.3 % — ABNORMAL LOW (ref 36.0–46.0)
Hemoglobin: 10.4 g/dL — ABNORMAL LOW (ref 12.0–15.0)
MCH: 29.9 pg (ref 26.0–34.0)
MCHC: 31.2 g/dL (ref 30.0–36.0)
MCV: 95.7 fL (ref 80.0–100.0)
Platelets: 261 K/uL (ref 150–400)
RBC: 3.48 MIL/uL — ABNORMAL LOW (ref 3.87–5.11)
RDW: 14 % (ref 11.5–15.5)
WBC: 8.5 K/uL (ref 4.0–10.5)
nRBC: 0 % (ref 0.0–0.2)

## 2024-01-14 LAB — BASIC METABOLIC PANEL WITH GFR
Anion gap: 9 (ref 5–15)
BUN: 28 mg/dL — ABNORMAL HIGH (ref 8–23)
CO2: 24 mmol/L (ref 22–32)
Calcium: 9.2 mg/dL (ref 8.9–10.3)
Chloride: 110 mmol/L (ref 98–111)
Creatinine, Ser: 0.77 mg/dL (ref 0.44–1.00)
GFR, Estimated: >60 mL/min (ref >60)
Glucose, Bld: 108 mg/dL — ABNORMAL HIGH (ref 70–99)
Potassium: 4.3 mmol/L (ref 3.5–5.1)
Sodium: 143 mmol/L (ref 135–145)

## 2024-01-14 MED ORDER — LOSARTAN POTASSIUM 50 MG PO TABS
50.0000 mg | ORAL_TABLET | Freq: Once | ORAL | Status: AC
  Administered 2024-01-14: 50 mg via ORAL
  Filled 2024-01-14: qty 1

## 2024-01-14 MED ORDER — ACETAMINOPHEN 500 MG PO TABS
1000.0000 mg | ORAL_TABLET | Freq: Once | ORAL | Status: AC
  Administered 2024-01-14: 1000 mg via ORAL
  Filled 2024-01-14: qty 2

## 2024-01-14 NOTE — Discharge Instructions (Addendum)
 Please talk with your PCP as we discussed

## 2024-01-14 NOTE — ED Provider Notes (Signed)
 [facility] Provider Note    Event Date/Time   First MD Initiated Contact with Patient 01/14/24 1205     (approximate)   History   Hypertension   HPI  Kathryn Garrett is a 78 y.o. female with a history of high blood pressure, she typically takes losartan  50 and metoprolol  who presents with complaints of elevated blood pressure, patient reports her blood pressure has been elevated over the last several days.  Last night she had mild headache, discussed with her PCP who recommended taking an additional dose of blood pressure medication, this morning she had another headache and her PCP referred her to the emergency department, she denies neurodeficits.  Otherwise feels well, does have mild headache at this time     Physical Exam   Triage Vital Signs: ED Triage Vitals  Encounter Vitals Group     BP 01/14/24 1147 (!) 204/92     Girls Systolic BP Percentile --      Girls Diastolic BP Percentile --      Boys Systolic BP Percentile --      Boys Diastolic BP Percentile --      Pulse Rate 01/14/24 1147 88     Resp 01/14/24 1147 17     Temp 01/14/24 1147 98 F (36.7 C)     Temp Source 01/14/24 1147 Oral     SpO2 01/14/24 1147 96 %     Weight 01/14/24 1148 59 kg (130 lb)     Height 01/14/24 1148 1.499 m (4' 11)     Head Circumference --      Peak Flow --      Pain Score 01/14/24 1148 6     Pain Loc --      Pain Education --      Exclude from Growth Chart --     Most recent vital signs: Vitals:   01/14/24 1242 01/14/24 1426  BP: (!) 193/77 (!) 165/71  Pulse: 70   Resp: 18   Temp:    SpO2: 96%      General: Awake, no distress.  CV:  Good peripheral perfusion.  Resp:  Normal effort.  Abd:  No distention.  Other:  Normal neurologic exam, cranial nerves II through XII are normal   ED Results / Procedures / Treatments   Labs (all labs ordered are listed, but only abnormal results are displayed) Labs Reviewed  CBC - Abnormal; Notable for  the following components:      Result Value   RBC 3.48 (*)    Hemoglobin 10.4 (*)    HCT 33.3 (*)    All other components within normal limits  BASIC METABOLIC PANEL WITH GFR - Abnormal; Notable for the following components:   Glucose, Bld 108 (*)    BUN 28 (*)    All other components within normal limits     EKG     RADIOLOGY CT head viewed interpreted by me, no acute abnormality, confirmed by radiology    PROCEDURES:  Critical Care performed:   Procedures   MEDICATIONS ORDERED IN ED: Medications  losartan  (COZAAR ) tablet 50 mg (50 mg Oral Given 01/14/24 1245)  acetaminophen  (TYLENOL ) tablet 1,000 mg (1,000 mg Oral Given 01/14/24 1245)     IMPRESSION / MDM / ASSESSMENT AND PLAN / ED COURSE  I reviewed the triage vital signs and the nursing notes. Patient's presentation is most consistent with severe exacerbation of chronic illness.  Patient with history of high blood pressure presents with  elevated blood pressure with systolic over 200, mild headache but reassuring neurologic exam and reassuring exam overall.  Lab work obtained and unremarkable, normal kidney function, CT scan without acute abnormalities.  Gave patient a dose of losartan  50 mg here on top of which she took this morning as well as Tylenol  1 g  She reports her headache resolved and her blood pressure improved significantly to 165 systolic,  No indication for admission at this time, she will follow-up closely with her PCP to discuss possibly increasing losartan  dosing        FINAL CLINICAL IMPRESSION(S) / ED DIAGNOSES   Final diagnoses:  Primary hypertension     Rx / DC Orders   ED Discharge Orders     None        Note:  This document was prepared using Dragon voice recognition software and may include unintentional dictation errors.   Arlander Charleston, MD 01/14/24 [phone]

## 2024-01-14 NOTE — ED Triage Notes (Signed)
 Pt sts that she has a headache due to having HTN. Pt sts that she took her medication this AM.

## 2024-01-21 ENCOUNTER — Inpatient Hospital Stay: Attending: Oncology

## 2024-01-21 ENCOUNTER — Encounter: Payer: Self-pay | Admitting: Oncology

## 2024-01-21 ENCOUNTER — Inpatient Hospital Stay (HOSPITAL_BASED_OUTPATIENT_CLINIC_OR_DEPARTMENT_OTHER): Admitting: Oncology

## 2024-01-21 VITALS — BP 150/80 | HR 78 | Temp 98.2°F | Resp 18 | Ht 59.0 in | Wt 134.0 lb

## 2024-01-21 DIAGNOSIS — I2510 Atherosclerotic heart disease of native coronary artery without angina pectoris: Secondary | ICD-10-CM | POA: Diagnosis not present

## 2024-01-21 DIAGNOSIS — I252 Old myocardial infarction: Secondary | ICD-10-CM | POA: Insufficient documentation

## 2024-01-21 DIAGNOSIS — I5042 Chronic combined systolic (congestive) and diastolic (congestive) heart failure: Secondary | ICD-10-CM | POA: Insufficient documentation

## 2024-01-21 DIAGNOSIS — Z79899 Other long term (current) drug therapy: Secondary | ICD-10-CM | POA: Insufficient documentation

## 2024-01-21 DIAGNOSIS — Z87891 Personal history of nicotine dependence: Secondary | ICD-10-CM | POA: Insufficient documentation

## 2024-01-21 DIAGNOSIS — D649 Anemia, unspecified: Secondary | ICD-10-CM | POA: Diagnosis not present

## 2024-01-21 DIAGNOSIS — Z7982 Long term (current) use of aspirin: Secondary | ICD-10-CM | POA: Diagnosis not present

## 2024-01-21 DIAGNOSIS — I110 Hypertensive heart disease with heart failure: Secondary | ICD-10-CM | POA: Diagnosis not present

## 2024-01-21 LAB — CBC WITH DIFFERENTIAL/PLATELET
Abs Immature Granulocytes: 0.03 K/uL (ref 0.00–0.07)
Basophils Absolute: 0.1 K/uL (ref 0.0–0.1)
Basophils Relative: 1 %
Eosinophils Absolute: 0.1 K/uL (ref 0.0–0.5)
Eosinophils Relative: 1 %
HCT: 34 % — ABNORMAL LOW (ref 36.0–46.0)
Hemoglobin: 10.7 g/dL — ABNORMAL LOW (ref 12.0–15.0)
Immature Granulocytes: 0 %
Lymphocytes Relative: 19 %
Lymphs Abs: 1.9 K/uL (ref 0.7–4.0)
MCH: 29.8 pg (ref 26.0–34.0)
MCHC: 31.5 g/dL (ref 30.0–36.0)
MCV: 94.7 fL (ref 80.0–100.0)
Monocytes Absolute: 0.7 K/uL (ref 0.1–1.0)
Monocytes Relative: 7 %
Neutro Abs: 6.9 K/uL (ref 1.7–7.7)
Neutrophils Relative %: 72 %
Platelets: 267 K/uL (ref 150–400)
RBC: 3.59 MIL/uL — ABNORMAL LOW (ref 3.87–5.11)
RDW: 13.7 % (ref 11.5–15.5)
WBC: 9.7 K/uL (ref 4.0–10.5)
nRBC: 0 % (ref 0.0–0.2)

## 2024-01-21 NOTE — Progress Notes (Signed)
 [facility]  [phone] [phone]  ID: Consuelo KANDICE Hummer OB: 03/22/1946  MR#: [id number]  RDW#:[id number]  Patient Care Team: Silvano Angeline FALCON, NP as PCP - General Dann Candyce RAMAN, MD as PCP - Cardiology (Cardiology) Vannie Reche RAMAN, NP as Nurse Practitioner (Cardiology) Jacobo Evalene PARAS, MD as Consulting Physician (Oncology)  CHIEF COMPLAINT: Anemia, unspecified.  INTERVAL HISTORY: Patient returns to clinic today for repeat laboratory for further evaluation.  She continues to feel well and at her baseline. She does not complain of any weakness or fatigue.  She has no neurologic complaints.  She denies any recent fevers or or illnesses.  She has a good appetite and denies weight loss.  She has no chest pain, shortness of breath, cough, or hemoptysis.  She denies any nausea, vomiting, constipation, or diarrhea.  She has no melena or hematochezia.  She has no urinary complaints today.  She offers no specific complaints today.  REVIEW OF SYSTEMS:   Review of Systems  Constitutional: Negative.  Negative for fever, malaise/fatigue and weight loss.  Respiratory: Negative.  Negative for cough and shortness of breath.   Cardiovascular: Negative.  Negative for chest pain and leg swelling.  Gastrointestinal: Negative.  Negative for abdominal pain.  Genitourinary: Negative.  Negative for dysuria.  Musculoskeletal: Negative.  Negative for back pain.  Skin: Negative.  Negative for rash.  Neurological: Negative.  Negative for dizziness, focal weakness, weakness and headaches.  Psychiatric/Behavioral: Negative.  The patient is not nervous/anxious.     As per HPI. Otherwise, a complete review of systems is negative.  PAST MEDICAL HISTORY: Past Medical History:  Diagnosis Date   Anemia, mild    Arthritis    qwhere (02/10/2016)   CAD (coronary artery disease)    a. VF arrest 01/2009/CAD with inferoposterior MI s/p aspiration thrombectomy/BMS of  RCA at that time. b. ISR of BMS s/p DES to RCA 06/2010 with moderate LM/LAD disease not significant by FFR. c. s/p angiosculpt PTCA to prox RCA 01/2016 for ISR. d. Aggressive PTCA to prox RCA for ISR 05/2016.   Cardiac arrest (HCC) 01/2009   a. in setting of inf-post STEMI 01/2009 (VF).   Chest pain 06/04/2016   Chronic bronchitis (HCC)    Chronic combined systolic and diastolic CHF (congestive heart failure) (HCC)    a. EF 40-45% by cath 2010. b. 60-65% with grade 1 DD by echo 09/2015   COPD (chronic obstructive pulmonary disease) (HCC)    Hyperlipidemia 01/28/2016   Hyperlipidemia LDL goal <70 01/28/2016   Hypertension    Lung mass    MI (myocardial infarction) (HCC) 01/2009   Narcotic abuse (HCC)    pt now taking Suboxone  tid   Pleural effusion on left 09/24/2015   Pneumonia 06/2014; 09/2015   Pre-diabetes    Pulmonary nodule    PVD (peripheral vascular disease) (HCC)    mild atherosclerosis of infrarenal aorta, 25% ostial left renal artery stenosis, 50% ostial right common iliac by cath 2010   S/P PTCA (percutaneous transluminal coronary angioplasty) 02/10/16 to RCA lesion for in stent restenois 02/11/2016   Subclavian artery stenosis (HCC)    a. >50% by duplex 05/2016.   Tobacco abuse    Unstable angina (HCC)     PAST SURGICAL HISTORY: Past Surgical History:  Procedure Laterality Date   ABDOMINAL HYSTERECTOMY     ANKLE FRACTURE SURGERY Right 2000s   CARDIAC CATHETERIZATION N/A 02/10/2016   Procedure: Left Heart Cath and Coronary Angiography;  Surgeon: Candyce RAMAN Dann,  MD;  Location: [facility] INVASIVE CV LAB;  Service: Cardiovascular;  Laterality: N/A;   CARDIAC CATHETERIZATION N/A 02/10/2016   Procedure: Coronary Balloon Angioplasty;  Surgeon: Candyce GORMAN Reek, MD;  Location: [facility] INVASIVE CV LAB;  Service: Cardiovascular;  Laterality: N/A;  instent RCA   CARDIAC CATHETERIZATION N/A 06/05/2016   Procedure: Left Heart Cath and Coronary Angiography;  Surgeon: Alm LELON Clay, MD;  Location: [facility]  INVASIVE CV LAB;  Service: Cardiovascular;  Laterality: N/A;   CARDIAC CATHETERIZATION N/A 06/05/2016   Procedure: Coronary Balloon Angioplasty;  Surgeon: Alm LELON Clay, MD;  Location: [facility] INVASIVE CV LAB;  Service: Cardiovascular;  Laterality: N/A;   CHEST TUBE INSERTION N/A 10/08/2015   Procedure: PLEURX CATH REMOVAL;  Surgeon: Evalene Glasser, MD;  Location: [facility] ORS;  Service: Thoracic;  Laterality: N/A;   CORONARY ANGIOPLASTY WITH STENT PLACEMENT  01/2009; 2012   FRACTURE SURGERY     RIGHT/LEFT HEART CATH AND CORONARY ANGIOGRAPHY N/A 09/24/2020   Procedure: RIGHT/LEFT HEART CATH AND CORONARY ANGIOGRAPHY;  Surgeon: Mady Bruckner, MD;  Location: [facility] INVASIVE CV LAB;  Service: Cardiovascular;  Laterality: N/A;   VIDEO ASSISTED THORACOSCOPY (VATS)/THOROCOTOMY Left 09/29/2015   Procedure: PREOP BRONCHOSCOPY, LEFT THORACOSCOPY, POSSIBLE THORACOTOMY, PLEURAL BIOPSY, TALC ;  Surgeon: Evalene Glasser, MD;  Location: [facility] ORS;  Service: General;  Laterality: Left;    FAMILY HISTORY: Family History  Problem Relation Age of Onset   Coronary artery disease Father    Hyperlipidemia Father    Early death Father    Heart attack Father    Coronary artery disease Mother    Hyperlipidemia Mother    Heart attack Mother    Cancer Brother     ADVANCED DIRECTIVES (Y/N):  N  HEALTH MAINTENANCE: Social History   Tobacco Use   Smoking status: Former    Current packs/day: 0.00    Average packs/day: 0.5 packs/day for 56.0 years (28.0 ttl pk-yrs)    Types: Cigarettes    Start date: 09/10/1964    Quit date: 09/10/2020    Years since quitting: 3.3   Smokeless tobacco: Never  Vaping Use   Vaping status: Never Used  Substance Use Topics   Alcohol use: No    Alcohol/week: 0.0 standard drinks of alcohol   Drug use: No     Colonoscopy:  PAP:  Bone density:  Lipid panel:  Allergies  Allergen Reactions   Pregabalin Other (See Comments)    'Makes me extremely sedated'   Azithromycin  Rash   Doxycycline   Nausea Only    Current Outpatient Medications  Medication Sig Dispense Refill   acetaminophen  (TYLENOL ) 500 MG tablet Take 1,000 mg by mouth every 6 (six) hours as needed for headache.     albuterol  (PROVENTIL  HFA;VENTOLIN  HFA) 108 (90 Base) MCG/ACT inhaler Inhale 2 puffs into the lungs every 4 (four) hours as needed for wheezing or shortness of breath.     aspirin  EC 81 MG tablet Take 81 mg by mouth at bedtime.      azelastine  (ASTELIN ) 0.1 % nasal spray Place 2 sprays into both nostrils daily.  0   Buprenorphine  HCl-Naloxone  HCl 8-2 MG FILM Place 0.5 Film under the tongue daily.     cetirizine  (ZYRTEC ) 10 MG tablet Take 1 tablet (10 mg total) by mouth daily. 30 tablet 0   clopidogrel  (PLAVIX ) 75 MG tablet Take 1 tablet (75 mg total) by mouth daily. 90 tablet 3   estradiol (ESTRACE) 0.1 MG/GM vaginal cream Place 1 Applicatorful vaginally.     furosemide  (LASIX ) 20  MG tablet Take 1 tablet (20 mg total) by mouth daily. MAY TAKE AN EXTRA 20 MG AS NEEDED FOR 2 LB WEIGHT GAIN IN 24 OR 5 LB WEIGHT GAIN IN 5 DAYS (Patient taking differently: Take 20 mg by mouth daily. MAY TAKE AN EXTRA 20 MG AS NEEDED FOR 2 LB WEIGHT GAIN IN 24 OR 5 LB WEIGHT GAIN IN 5 DAYS Taking PRN) 90 tablet 3   losartan  (COZAAR ) 100 MG tablet Take 100 mg by mouth daily.     metoprolol  succinate (TOPROL -XL) 50 MG 24 hr tablet Take 1 tablet (50 mg total) by mouth daily. Take with or immediately following a meal. 90 tablet 1   nitroGLYCERIN  (NITROSTAT ) 0.4 MG SL tablet Place 1 tablet (0.4 mg total) under the tongue every 5 (five) minutes as needed for chest pain. 25 tablet 3   nystatin  cream (MYCOSTATIN ) Apply topically 2 (two) times daily.     rosuvastatin  (CRESTOR ) 20 MG tablet Take 1 tablet (20 mg total) by mouth daily. 90 tablet 3   TRELEGY ELLIPTA 100-62.5-25 MCG/INH AEPB Take 1 puff by mouth daily.     triamcinolone cream (KENALOG) 0.1 % Apply 1 application topically 2 (two) times daily. to affected area     losartan  (COZAAR )  50 MG tablet Take 1 tablet (50 mg total) by mouth daily. (Patient not taking: Reported on 01/21/2024) 90 tablet 3   No current facility-administered medications for this visit.    OBJECTIVE: Vitals:   01/21/24 1317 01/21/24 1321  BP: (!) 152/58 (!) 150/80  Pulse: 78   Resp: 18   Temp: 98.2 F (36.8 C)   SpO2: 98%       Body mass index is 27.06 kg/m.    ECOG FS:0 - Asymptomatic  General: Well-developed, well-nourished, no acute distress. Eyes: Pink conjunctiva, anicteric sclera. HEENT: Normocephalic, moist mucous membranes. Lungs: No audible wheezing or coughing. Heart: Regular rate and rhythm. Abdomen: Soft, nontender, no obvious distention. Musculoskeletal: No edema, cyanosis, or clubbing. Neuro: Alert, answering all questions appropriately. Cranial nerves grossly intact. Skin: No rashes or petechiae noted. Psych: Normal affect.  LAB RESULTS:  Lab Results  Component Value Date   NA 143 01/14/2024   K 4.3 01/14/2024   CL 110 01/14/2024   CO2 24 01/14/2024   GLUCOSE 108 (H) 01/14/2024   BUN 28 (H) 01/14/2024   CREATININE 0.77 01/14/2024   CALCIUM  9.2 01/14/2024   PROT 7.0 10/12/2023   ALBUMIN 3.8 10/12/2023   AST 18 10/12/2023   ALT 13 10/12/2023   ALKPHOS 59 10/12/2023   BILITOT 0.5 10/12/2023   GFRNONAA >60 01/14/2024   GFRAA 77 10/02/2019    Lab Results  Component Value Date   WBC 9.7 01/21/2024   NEUTROABS 6.9 01/21/2024   HGB 10.7 (L) 01/21/2024   HCT 34.0 (L) 01/21/2024   MCV 94.7 01/21/2024   PLT 267 01/21/2024     STUDIES: CT Head Wo Contrast Result Date: 01/14/2024 CLINICAL DATA:  Headache, increasing frequency or severity Headache, new onset (Age >= 51y) Pt sts that she has a headache due to having HTN. Pt sts that she took her medication this AM. EXAM: CT HEAD WITHOUT CONTRAST TECHNIQUE: Contiguous axial images were obtained from the base of the skull through the vertex without intravenous contrast. RADIATION DOSE REDUCTION: This exam was  performed according to the departmental dose-optimization program which includes automated exposure control, adjustment of the mA and/or kV according to patient size and/or use of iterative reconstruction technique. COMPARISON:  CT head  10/12/2023 FINDINGS: Brain: Patchy and confluent areas of decreased attenuation are noted throughout the deep and periventricular white matter of the cerebral hemispheres bilaterally, compatible with chronic microvascular ischemic disease. No evidence of large-territorial acute infarction. No parenchymal hemorrhage. No mass lesion. No extra-axial collection. No mass effect or midline shift. No hydrocephalus. Basilar cisterns are patent. Vascular: No hyperdense vessel. Atherosclerotic calcifications are present within the cavernous internal carotid arteries. Skull: No acute fracture or focal lesion. Sinuses/Orbits: Paranasal sinuses and mastoid air cells are clear. The orbits are unremarkable. Other: None. IMPRESSION: No acute intracranial abnormality. Electronically Signed   By: Morgane  Naveau M.D.   On: 01/14/2024 14:16    ASSESSMENT: Anemia, unspecified. Elevated ferritin.  PLAN:    Anemia unspecified: Chronic and unchanged.  Upon review of patient's hemoglobin has ranged between 9.1 and 13.1 since at least May 2022.  Today's result is 10.7 which has trended up over the past 6 months. Previously, all of her other laboratory work including [employer] myeloid panel and SPEP was either negative or within normal limits.  No intervention is needed at this time.  Patient does not require bone marrow biopsy.  After discussion with the patient, it was agreed upon that no further follow-up is necessary.  Please continue to monitor hemoglobin 1-2 times per year and if it trends down and remains persistently below 8.0, please refer patient back for further evaluation.  Hypertension: Chronic and patient's blood pressure is moderately elevated today.  Continue monitoring and treatment  per primary care.    Patient expressed understanding and was in agreement with this plan. She also understands that She can call clinic at any time with any questions, concerns, or complaints.    Evalene JINNY Reusing, MD   01/21/2024 1:28 PM

## 2024-01-21 NOTE — Progress Notes (Signed)
 Patient was given another bp medication she is going call with the name of the medication. Headache ongoing for about a week now. She rates her pain a a 6.

## 2024-01-28 ENCOUNTER — Ambulatory Visit (HOSPITAL_BASED_OUTPATIENT_CLINIC_OR_DEPARTMENT_OTHER): Admitting: Family

## 2024-01-28 ENCOUNTER — Encounter (HOSPITAL_BASED_OUTPATIENT_CLINIC_OR_DEPARTMENT_OTHER): Payer: Self-pay | Admitting: Family

## 2024-01-28 VITALS — BP 138/62 | HR 73 | Resp 17 | Ht 59.0 in | Wt 133.0 lb

## 2024-01-28 DIAGNOSIS — I10 Essential (primary) hypertension: Secondary | ICD-10-CM

## 2024-01-28 DIAGNOSIS — R5381 Other malaise: Secondary | ICD-10-CM | POA: Diagnosis not present

## 2024-01-28 MED ORDER — OLMESARTAN MEDOXOMIL 40 MG PO TABS: 40.0000 mg | ORAL_TABLET | Freq: Every day | ORAL | 3 refills | Status: AC

## 2024-01-28 NOTE — Progress Notes (Signed)
 Cardiology Office Note:  .   Date:  01/28/2024  ID:  Kathryn Garrett, DOB Mar 20, 1946, MRN [id number] PCP: Silvano Angeline FALCON, NP  [facility] Providers Cardiologist:  Candyce Reek, MD Cardiology APP:  Vannie Reche RAMAN, NP    History of Present Illness: .   Kathryn Garrett is a 78 y.o. female with a hx of CAD s/p DES to RCA, hypertension, hyperlipidemia, COPD, HFrEF/ICM  Her CAD dates back to inferior MI treated with bare metal stent to RCA in 2010. In 06/2010 she had DES to proximal to mid RCA for ISR of the BMS. Her LM and LAD disease was moderate and not significant. Admitted 01/2016 for unstable angina with cardiac cath showing ISR of RCA for which she underwent balloon angioplasty with ISR 75% ? 0% and Plavix  was resumed. She had unstable angina again 05/2016 with cardiac cath showing ISR within proximal RCA treated with PTCA with scoring balloon. She had bruit on exam with subsequent carotid doppler showing bilateral ECA stenosis >50% but no significant ICA stenosis.    Started on Levothyroxine  05/2020 due to hypothyroidism. Hospitalized 09/10/20 with acute hypoxic respiratory failure secondary to COPD exacerbation complicated by flash pulmonary edema and NSTEMI. Echo 09/11/20 LVEF 25-30%, indeterminite diastolic parameters, mild MR. [facility] 09/24/20 showing mild to moderate nonobstructive CAD including  20-35% distal LMCA and 50% ostial LAD disease with FFR not hemodynamicaly significant, prox RCA stent with mild-moderat ICR (30-40%), normal pulmonary pressure, cardiac output/index, filling pressures. Hospital course complicated by gram negative bacteremia, evaluated by ID and treated with meropenem . Had SVT while admitted and was discharged on Toprol . Repeat echo 11/21/2020 LVEF 60 to 65%, no R WMA, mild LVH, grade 1 diastolic dysfunction, RV normal size and function, normal PASP, trivial MR.     Seen 06/07/21. Crestor  was resumed at lower dose of 20mg  QD due to reported myalgias.    Admitted 9/5-01/19/22 with acute heart failure, diuresed with IV lasix . Noted home noncompliance with Lasix .  Discharged on lasix  40mg  QD, Losartan  50mg  QD, Metoprolol  succiante 50mg .  Lasix  later reduced to 20 mg every other day due to elevated creatinine   Seen 02/2022 noting palpitations.  Subsequent ZIO monitor predominantly normal sinus rhythm with 7 short runs of SVT which were not triggered episodes.    Admitted 2/12 - 06/28/2022 to [facility] after presenting with shortness of breath.Initial troponin 229 and BNP 354.  EKG no acute changes.  She was admitted for CHF.  She was discharged on Lasix  40 mg.  ED visit 06/24/23 for fall 3 days prior when sleeping on the sofa and rolled off striking her face on the ground. CT head small right frontal scald hematoma, cervical spine unremarkable.   Last seen 06/29/2023.  BP was well-controlled.  Rare palpitations not bothersome, Toprol  50 mg daily was continued.  She reported no anginal symptoms.  She was euvolemic on exam.  She was not taking her thyroid medication routinely which was held be contributory to her fatigue and she was encouraged to discuss with her primary care provider.  Presents today for follow up. Per hematology plan to monitor Hb 1-2 times per year and if persistently reduced, re-refer to hematology. She reports significant lower back pain which is worse with movement. Does not hurt at rest. This has been ongoing for some time. She hass been taking Tylenol  for pain with minimal improvement. She does then follow with ibuprofen  800mg  every other day. Reports BP hat home has been labile, more often  than not >130/80.  ROS: Please see the history of present illness.    All other systems reviewed and are negative.   Studies Reviewed: .        Cardiac Studies & Procedures   ______________________________________________________________________________________________ CARDIAC CATHETERIZATION  CARDIAC CATHETERIZATION  09/24/2020  Conclusion Conclusions: 1. Mild to moderate, non-obstructive coronary artery disease including 20-35% distal LMCA and 50% ostial LAD disease, similar to prior catheterization when FFR was not hemodynamically significant. 2. Patent proximal RCA stents with mild to moderate in-stent restenosis (30-40%). 3. Normal left and right heart filling pressures. 4. Normal pulmonary artery pressures. 5. Normal cardiac output/index.  Recommendations: 1. Maintain net even fluid balance. 2. Optimize goal-directed medical therapy with plans to repeat limited echo in 4-6 weeks. 3. Continue secondary prevention of coronary artery disease.  Lonni Hanson, MD [facility]  Findings Coronary Findings Diagnostic  Dominance: Right  Left Main Vessel is moderate in size. Dist LM lesion is 25% stenosed.  Left Anterior Descending Vessel is moderate in size. Ost LAD lesion is 50% stenosed.  Left Circumflex Vessel is moderate in size. Vessel is angiographically normal.  First Obtuse Marginal Branch Vessel is small in size.  Second Obtuse Marginal Branch Vessel is moderate in size.  Right Coronary Artery Vessel is moderate in size. Prox RCA lesion is 35% stenosed. The lesion was previously treated. Previously placed stent displays restenosis.  Right Posterior Descending Artery Vessel is moderate in size.  First Right Posterolateral Branch Vessel is small in size.  Second Right Posterolateral Branch Vessel is small in size.  Third Right Posterolateral Branch Vessel is moderate in size.  Intervention  No interventions have been documented.   CARDIAC CATHETERIZATION  CARDIAC CATHETERIZATION 06/05/2016  Conclusion Images from the original result were not included.   Ost RCA lesion, 30 %stenosed.  LM lesion, 30 %stenosed.  Ost LAD lesion, 50 %stenosed - looks similar to prior catheterization. FFR 0.87  The left ventricular systolic function is normal.  LV end  diastolic pressure is normal.  The left ventricular ejection fraction is 55-65% by visual estimate.  There is mild (2+) mitral regurgitation.  _______CULPRIT LESION_____________  Prox RCA Overlapped BMS-DES stents, 99 % -n-stent re-stenosed.  PTCA with 3.0 mm Scoring Balloon with Post-dilation using 3.5 mm [place] Balloon was perfromed. Post intervention, there is a 0% residual stenosis.  Recurrent in-stent restenosis of the overlapped stent (BMS and DES) in proximal RCA treated with aggressive angioplasty. Repeat FFR on ostial LAD confirms this is not likely significant.   Plan:  She'll be transferred to 6 Central post procedure unit for sheath removal and post PCI care.  Continue aggressive risk factor modification  Continue aspirin  and Plavix  lifelong  Smoking cessation counseling is mandatory to maintain stent patency   Alm Clay, M.D., M.S. Interventional Cardiologist  Pager # [phone] Phone # [phone] [address] [place], [place] [address]  Findings Coronary Findings Diagnostic  Dominance: Right  Left Main Vessel is large. The lesion is tubular and smooth.  Left Anterior Descending Vessel is moderate in size. The vessel exhibits minimal luminal irregularities. The vessel is tortuous. Somewhat tortuous vessel it has several small diagonal branches. It does not reach all the way to the apex. The lesion is located at the major branch and discrete. The stenosis was measured by a visual reading. Pressure wire/FFR was performed on the lesion. FFR: 0.87.  First Diagonal Branch Vessel is small in size. The vessel exhibits minimal luminal irregularities.  First Septal Branch Vessel is  small in size.  Second Diagonal Branch Vessel is small in size. Vessel is angiographically normal.  Second Septal Branch Vessel is small in size.  Third Diagonal Branch Vessel is small in size.  Third Septal Branch Vessel is small in size.  Left  Circumflex Vessel is angiographically normal.  First Obtuse Marginal Branch Vessel is small in size.  Second Obtuse Marginal Branch Vessel is small in size. Vessel is angiographically normal.  Third Obtuse Marginal Branch Vessel is angiographically normal.  Lateral Third Obtuse Marginal Branch Vessel is moderate in size.  Right Coronary Artery Vessel is large.  The lesion is tubular and eccentric. The lesion was previously treated using a bare metal stent, a drug eluting stent and angioplasty between 1-5 months ago. Previously placed stent displays restenosis.  Acute Marginal Branch Vessel is small in size.  Right Posterior Descending Artery Vessel is moderate in size. Vessel is angiographically normal. The vessel is tortuous.  Inferior Septal Vessel is small in size.  Right Posterior Atrioventricular Artery Vessel is moderate in size.  First Right Posterolateral Branch Vessel is small in size.  Second Right Posterolateral Branch Vessel is moderate in size.  Intervention  Prox RCA lesion Angioplasty Lesion length: 20 mm. Lesion crossed with guidewire using a WIRE ASAHI PROWATER 180CM. Angioplasty alone was performed using a BALLOON [place] MOZEC 3.5X15. Maximum pressure: 16 atm. Inflation time: 30 sec. Initial: BALLOON EUPHORA RX2.5X12 [id number]) - 12 Atm x 20 Sec Scoring Balloon: BALLOON ANGIOSCULPT RX 3.0X10 ([id number]) - 16 Atm x 30 Sec x 3 CATH LAUNCHER 61F JR4 (Yes)  Used: unable to advance 6Fr Catheter into distal Aorta due to potential stenosis. There is a 0% residual stenosis post intervention.     ECHOCARDIOGRAM  ECHOCARDIOGRAM COMPLETE 10/15/2023  Narrative ECHOCARDIOGRAM REPORT    Patient Name:   ELANIE HAMMITT Date of Exam: 10/15/2023 Medical Rec #:  [id number]         Height:       59.0 in Accession #:    [id number]        Weight:       141.1 lb Date of Birth:  November 18, 1945         BSA:          1.590 m Patient Age:    77 years          BP:           135/52  mmHg Patient Gender: F                 HR:           82 bpm. Exam Location:  [facility]  Procedure: 2D Echo, Cardiac Doppler and Color Doppler (Both Spectral and Color Flow Doppler were utilized during procedure).  Indications:     Congestive heart failure I50.9  History:         Patient has prior history of Echocardiogram examinations, most recent 11/24/2020. CAD, COPD, Signs/Symptoms:Chest Pain; Risk Factors:Hypertension.  Sonographer:     Christopher Furnace Referring Phys:  [id number] SONA PATEL Diagnosing Phys: Deatrice Cage MD  IMPRESSIONS   1. Left ventricular ejection fraction, by estimation, is 55 to 60%. The left ventricle has normal function. The left ventricle has no regional wall motion abnormalities. There is mild left ventricular hypertrophy. Left ventricular diastolic parameters are consistent with Grade I diastolic dysfunction (impaired relaxation). 2. Right ventricular systolic function is normal. The right ventricular size is normal. Tricuspid regurgitation signal is inadequate for assessing PA pressure. 3. The  mitral valve is normal in structure. No evidence of mitral valve regurgitation. No evidence of mitral stenosis. Moderate mitral annular calcification. 4. The aortic valve is normal in structure. Aortic valve regurgitation is not visualized. No aortic stenosis is present. 5. The inferior vena cava is normal in size with greater than 50% respiratory variability, suggesting right atrial pressure of 3 mmHg.  FINDINGS Left Ventricle: Left ventricular ejection fraction, by estimation, is 55 to 60%. The left ventricle has normal function. The left ventricle has no regional wall motion abnormalities. The left ventricular internal cavity size was normal in size. There is mild left ventricular hypertrophy. Left ventricular diastolic parameters are consistent with Grade I diastolic dysfunction (impaired relaxation).  Right Ventricle: The right ventricular size is normal. No increase in right  ventricular wall thickness. Right ventricular systolic function is normal. Tricuspid regurgitation signal is inadequate for assessing PA pressure. The tricuspid regurgitant velocity is 1.88 m/s, and with an assumed right atrial pressure of 3 mmHg, the estimated right ventricular systolic pressure is 17.1 mmHg.  Left Atrium: Left atrial size was normal in size.  Right Atrium: Right atrial size was normal in size.  Pericardium: There is no evidence of pericardial effusion.  Mitral Valve: The mitral valve is normal in structure. Moderate mitral annular calcification. No evidence of mitral valve regurgitation. No evidence of mitral valve stenosis. MV peak gradient, 8.9 mmHg. The mean mitral valve gradient is 4.0 mmHg.  Tricuspid Valve: The tricuspid valve is normal in structure. Tricuspid valve regurgitation is not demonstrated. No evidence of tricuspid stenosis.  Aortic Valve: The aortic valve is normal in structure. Aortic valve regurgitation is not visualized. No aortic stenosis is present. Aortic valve mean gradient measures 4.0 mmHg. Aortic valve peak gradient measures 8.0 mmHg. Aortic valve area, by VTI measures 3.42 cm.  Pulmonic Valve: The pulmonic valve was normal in structure. Pulmonic valve regurgitation is not visualized. No evidence of pulmonic stenosis.  Aorta: The aortic root is normal in size and structure.  Venous: The inferior vena cava is normal in size with greater than 50% respiratory variability, suggesting right atrial pressure of 3 mmHg.  IAS/Shunts: No atrial level shunt detected by color flow Doppler.   LEFT VENTRICLE PLAX 2D LVIDd:         3.60 cm LVIDs:         2.60 cm LV PW:         1.10 cm LV IVS:        1.40 cm LVOT diam:     2.00 cm LV SV:         82 LV SV Index:   52 LVOT Area:     3.14 cm   RIGHT VENTRICLE RV Basal diam:  2.70 cm RV Mid diam:    2.60 cm RV S prime:     15.70 cm/s TAPSE (M-mode): 1.8 cm  LEFT ATRIUM             Index         RIGHT ATRIUM           Index LA diam:        3.10 cm 1.95 cm/m   RA Area:     11.20 cm LA Vol (A2C):   19.2 ml 12.07 ml/m  RA Volume:   22.90 ml  14.40 ml/m LA Vol (A4C):   20.8 ml 13.08 ml/m LA Biplane Vol: 21.8 ml 13.71 ml/m AORTIC VALVE AV Area (Vmax):    2.55 cm AV Area (Vmean):  2.81 cm AV Area (VTI):     3.42 cm AV Vmax:           141.50 cm/s AV Vmean:          90.500 cm/s AV VTI:            0.240 m AV Peak Grad:      8.0 mmHg AV Mean Grad:      4.0 mmHg LVOT Vmax:         115.00 cm/s LVOT Vmean:        81.000 cm/s LVOT VTI:          0.261 m LVOT/AV VTI ratio: 1.09  AORTA Ao Root diam: 2.90 cm  MITRAL VALVE                TRICUSPID VALVE MV Area (PHT): 2.97 cm     TR Peak grad:   14.1 mmHg MV Area VTI:   3.07 cm     TR Vmax:        188.00 cm/s MV Peak grad:  8.9 mmHg MV Mean grad:  4.0 mmHg     SHUNTS MV Vmax:       1.49 m/s     Systemic VTI:  0.26 m MV Vmean:      90.5 cm/s    Systemic Diam: 2.00 cm MV Decel Time: 255 msec MV E velocity: 73.40 cm/s MV A velocity: 125.00 cm/s MV E/A ratio:  0.59  Deatrice Cage MD Electronically signed by Deatrice Cage MD Signature Date/Time: 10/15/2023/3:35:57 PM    Final    MONITORS  LONG TERM MONITOR (3-14 DAYS) 03/15/2022  Narrative   Normal sinus rhythm with rare PACs and rare PVCs.   Rare, brief runs of PACs, not associated with symptoms.   No atrial fibrillation.   No symptoms reported.   No sustained pathologic arrhythmias.   Patch Wear Time:  14 days and 0 hours (2023-10-02T13:51:55-0400 to 2023-10-16T13:51:59-0400)  Patient had a min HR of 52 bpm, max HR of 158 bpm, and avg HR of 74 bpm. Predominant underlying rhythm was Sinus Rhythm. 7 Supraventricular Tachycardia runs occurred, the run with the fastest interval lasting 8 beats with a max rate of 158 bpm, the longest lasting 13 beats with an avg rate of 124 bpm. Isolated SVEs were rare (<1.0%), SVE Couplets were rare (<1.0%), and SVE Triplets were  rare (<1.0%). Isolated VEs were rare (<1.0%), VE Couplets were rare (<1.0%), and no VE Triplets were present. Ventricular Trigeminy was present.       ______________________________________________________________________________________________         Risk Assessment/Calculations:            Physical Exam:   VS:  BP 138/62   Pulse 73   Resp 17   Ht 4' 11 (1.499 m)   Wt 133 lb (60.3 kg)   SpO2 97%   BMI 26.86 kg/m    Wt Readings from Last 3 Encounters:  01/28/24 133 lb (60.3 kg)  01/21/24 134 lb (60.8 kg)  01/14/24 130 lb (59 kg)    GEN: Well nourished, overweight,  well developed in no acute distress NECK: No JVD; No carotid bruits CARDIAC: RRR, no murmurs, rubs, gallops RESPIRATORY:  Clear to auscultation without rales, wheezing or rhonchi  ABDOMEN: Soft, non-tender, non-distended EXTREMITIES:  No edema; No deformity   ASSESSMENT AND PLAN: .     SVT / Sinus tachycardia - Monitor 03/2022 with 7 brief episodes of SVT which were not triggered episodes.  No  indication for further workup at this time.  Continue present dose metoprolol  succinate 50mg  daily.  Rare palpitations are overall not bothersome.    HTN - BP not routinely at goal <130/80. Stop Losartan , start Olmesartan  40mg  daily. BMET in 1-2 weeks.. Continue Lasix  20 mg PRN, Toprol  50 mg daily.   CAD s/p DES to RCA / HLD, LDL goal <70 - RCA initially with BMS with ISR requiring DES and subsequent balloon angioplasty. LHC 09/2020 mild to moderate nonobstructive disease of distal LMCA and LAD with patent prox RCA stent mild to moderate ISR (30-40%).Stable with no anginal symptoms. No indication for ischemic evaluation.  GDMT aspirin , Toprol  succinate 50 mg daily, Crestor  20 mg daily.  08/2023 LDL 52.   Chronic systolic and diastolic heart failure with recovered LVEF./ Ischemic cardiomyopathy -Jardiance  previously discontinued due to UTI.  GDMT includes Toprol  50 mg daily, losartan  50 mg daily, Lasix  20 mg PRN (taking  twice per week). Euvolemic and well compensated on exam. Low sodium diet, fluid restriction <2L, and daily weights encouraged. Educated to contact our office for weight gain of 2 lbs overnight or 5 lbs in one week.   Hypothyroidism -  Continue to follow with PCP.           Dispo: follow up in 4 months  Signed, Reche GORMAN Finder, NP

## 2024-01-28 NOTE — Patient Instructions (Signed)
 Medication Instructions:   DISCONTINUE Losartan   START Olmesartan  one (1) tablet by mouth ( 40 mg) daily.   *If you need a refill on your cardiac medications before your next appointment, please call your pharmacy*  Lab Work:  Your physician recommends that you return for lab work in: 2 weeks at any lab corp.  No fasting.    If you have labs (blood work) drawn today and your tests are completely normal, you will receive your results only by: MyChart Message (if you have MyChart) OR A paper copy in the mail If you have any lab test that is abnormal or we need to change your treatment, we will call you to review the results.  Testing/Procedures:  None ordered.  Follow-Up: At [facility], you and your health needs are our priority.  As part of our continuing mission to provide you with exceptional heart care, our providers are all part of one team.  This team includes your primary Cardiologist (physician) and Advanced Practice Providers or APPs (Physician Assistants and Nurse Practitioners) who all work together to provide you with the care you need, when you need it.  Your next appointment:   2 month(s)  Provider:   Reche Finder, NP    We recommend signing up for the patient portal called MyChart.  Sign up information is provided on this After Visit Summary.  MyChart is used to connect with patients for Virtual Visits (Telemedicine).  Patients are able to view lab/test results, encounter notes, upcoming appointments, etc.  Non-urgent messages can be sent to your provider as well.   To learn more about what you can do with MyChart, go to [email or web address].   Other Instructions  You have been referred to Physical Therapy. That office should call you to schedule appointment.

## 2024-01-29 ENCOUNTER — Ambulatory Visit: Admitting: Pulmonary Disease

## 2024-01-29 ENCOUNTER — Encounter: Payer: Self-pay | Admitting: Pulmonary Disease

## 2024-01-29 VITALS — BP 136/66 | HR 71 | Temp 97.6°F | Ht 59.0 in | Wt 133.4 lb

## 2024-01-29 DIAGNOSIS — J449 Chronic obstructive pulmonary disease, unspecified: Secondary | ICD-10-CM | POA: Diagnosis not present

## 2024-01-29 DIAGNOSIS — J439 Emphysema, unspecified: Secondary | ICD-10-CM

## 2024-01-29 DIAGNOSIS — R911 Solitary pulmonary nodule: Secondary | ICD-10-CM | POA: Diagnosis not present

## 2024-01-29 NOTE — Progress Notes (Unsigned)
 Subjective:    Patient ID: Kathryn Garrett, female    DOB: [birth year], 78 y.o.   MRN: [id number]  Patient Care Team: Silvano Angeline FALCON, NP as PCP - General Vannie Reche RAMAN, NP as Nurse Practitioner (Cardiology) Jacobo Evalene PARAS, MD as Consulting Physician (Oncology)  No chief complaint on file.   BACKGROUND:   HPI    Review of Systems A 10 point review of systems was performed and it is as noted above otherwise negative.   Past Medical History:  Diagnosis Date   Anemia, mild    Arthritis    qwhere (02/10/2016)   CAD (coronary artery disease)    a. VF arrest 01/2009/CAD with inferoposterior MI s/p aspiration thrombectomy/BMS of RCA at that time. b. ISR of BMS s/p DES to RCA 06/2010 with moderate LM/LAD disease not significant by FFR. c. s/p angiosculpt PTCA to prox RCA 01/2016 for ISR. d. Aggressive PTCA to prox RCA for ISR 05/2016.   Cardiac arrest (HCC) 01/2009   a. in setting of inf-post STEMI 01/2009 (VF).   Chest pain 06/04/2016   Chronic bronchitis (HCC)    Chronic combined systolic and diastolic CHF (congestive heart failure) (HCC)    a. EF 40-45% by cath 2010. b. 60-65% with grade 1 DD by echo 09/2015   COPD (chronic obstructive pulmonary disease) (HCC)    Hyperlipidemia 01/28/2016   Hyperlipidemia LDL goal <70 01/28/2016   Hypertension    Lung mass    MI (myocardial infarction) (HCC) 01/2009   Narcotic abuse (HCC)    pt now taking Suboxone  tid   Pleural effusion on left 09/24/2015   Pneumonia 06/2014; 09/2015   Pre-diabetes    Pulmonary nodule    PVD (peripheral vascular disease) (HCC)    mild atherosclerosis of infrarenal aorta, 25% ostial left renal artery stenosis, 50% ostial right common iliac by cath 2010   S/P PTCA (percutaneous transluminal coronary angioplasty) 02/10/16 to RCA lesion for in stent restenois 02/11/2016   Subclavian artery stenosis (HCC)    a. >50% by duplex 05/2016.   Tobacco abuse    Unstable angina [facility])     Past Surgical History:   Procedure Laterality Date   ABDOMINAL HYSTERECTOMY     ANKLE FRACTURE SURGERY Right 2000s   CARDIAC CATHETERIZATION N/A 02/10/2016   Procedure: Left Heart Cath and Coronary Angiography;  Surgeon: Candyce RAMAN Reek, MD;  Location: [facility] INVASIVE CV LAB;  Service: Cardiovascular;  Laterality: N/A;   CARDIAC CATHETERIZATION N/A 02/10/2016   Procedure: Coronary Balloon Angioplasty;  Surgeon: Candyce RAMAN Reek, MD;  Location: [facility] INVASIVE CV LAB;  Service: Cardiovascular;  Laterality: N/A;  instent RCA   CARDIAC CATHETERIZATION N/A 06/05/2016   Procedure: Left Heart Cath and Coronary Angiography;  Surgeon: Alm LELON Clay, MD;  Location: [facility] INVASIVE CV LAB;  Service: Cardiovascular;  Laterality: N/A;   CARDIAC CATHETERIZATION N/A 06/05/2016   Procedure: Coronary Balloon Angioplasty;  Surgeon: Alm LELON Clay, MD;  Location: [facility] INVASIVE CV LAB;  Service: Cardiovascular;  Laterality: N/A;   CHEST TUBE INSERTION N/A 10/08/2015   Procedure: PLEURX CATH REMOVAL;  Surgeon: Evalene Glasser, MD;  Location: [facility] ORS;  Service: Thoracic;  Laterality: N/A;   CORONARY ANGIOPLASTY WITH STENT PLACEMENT  01/2009; 2012   FRACTURE SURGERY     RIGHT/LEFT HEART CATH AND CORONARY ANGIOGRAPHY N/A 09/24/2020   Procedure: RIGHT/LEFT HEART CATH AND CORONARY ANGIOGRAPHY;  Surgeon: Mady Bruckner, MD;  Location: [facility] INVASIVE CV LAB;  Service: Cardiovascular;  Laterality: N/A;   VIDEO ASSISTED  THORACOSCOPY (VATS)/THOROCOTOMY Left 09/29/2015   Procedure: PREOP BRONCHOSCOPY, LEFT THORACOSCOPY, POSSIBLE THORACOTOMY, PLEURAL BIOPSY, TALC ;  Surgeon: Evalene Glasser, MD;  Location: [facility] ORS;  Service: General;  Laterality: Left;    Patient Active Problem List   Diagnosis Date Noted   Fever 10/16/2023   Hypoxia 10/16/2023   Sepsis due to pneumonia (HCC) 10/13/2023   Hypothyroidism 10/13/2023   Essential hypertension 10/13/2023   Coronary artery disease 10/13/2023   SVT (supraventricular tachycardia) (HCC)    Acute combined systolic  and diastolic heart failure (HCC)    Pure hypercholesterolemia    Acute on chronic HFrEF (heart failure with reduced ejection fraction) (HCC)    Acute respiratory failure (HCC) 09/11/2020   HFrEF (heart failure with reduced ejection fraction) (HCC)    Acute exacerbation of chronic obstructive pulmonary disease (COPD) (HCC) 09/10/2020   Severe sepsis (HCC) 09/10/2020   NSTEMI (non-ST elevated myocardial infarction) (HCC) 09/10/2020   Hyperglycemia 09/10/2020   SOB (shortness of breath) 09/10/2020   Chronic obstructive pulmonary disease (HCC) 03/05/2018   History of opioid abuse (HCC) 03/05/2018   Nocturnal hypoxemia 10/30/2017   Peripheral arterial disease (HCC) 08/30/2016   Chest pain 06/04/2016   S/P PTCA (percutaneous transluminal coronary angioplasty) 02/10/16 to RCA lesion for in stent restenois 02/11/2016   Unstable angina (HCC)    Hyperlipidemia LDL goal <70 01/28/2016   Presence of coronary angioplasty implant and graft 10/15/2015   Tobacco abuse 10/15/2015   CAP (community acquired pneumonia) 09/24/2015   CAD in native artery 09/24/2015   HTN (hypertension) 09/24/2015   Lung nodule, solitary 03/31/2014   Liver nodule 03/31/2014    Family History  Problem Relation Age of Onset   Coronary artery disease Father    Hyperlipidemia Father    Early death Father    Heart attack Father    Coronary artery disease Mother    Hyperlipidemia Mother    Heart attack Mother    Cancer Brother     Social History   Tobacco Use   Smoking status: Former    Current packs/day: 0.00    Average packs/day: 0.5 packs/day for 56.0 years (28.0 ttl pk-yrs)    Types: Cigarettes    Start date: 09/10/1964    Quit date: 09/10/2020    Years since quitting: 3.3   Smokeless tobacco: Never  Substance Use Topics   Alcohol use: No    Alcohol/week: 0.0 standard drinks of alcohol    Allergies  Allergen Reactions   Pregabalin Other (See Comments)    'Makes me extremely sedated'   Azithromycin   Rash   Doxycycline  Nausea Only    No outpatient medications have been marked as taking for the 01/29/24 encounter (Appointment) with Tamea Dedra CROME, MD.    Immunization History  Administered Date(s) Administered   Influenza,inj,Quad PF,6+ Mos 03/05/2018        Objective:     There were no vitals taken for this visit.     GENERAL: HEAD: Normocephalic, atraumatic.  EYES: Pupils equal, round, reactive to light.  No scleral icterus.  MOUTH:  NECK: Supple. No thyromegaly. Trachea midline. No JVD.  No adenopathy. PULMONARY: Good air entry bilaterally.  No adventitious sounds. CARDIOVASCULAR: S1 and S2. Regular rate and rhythm.  ABDOMEN: MUSCULOSKELETAL: No joint deformity, no clubbing, no edema.  NEUROLOGIC:  SKIN: Intact,warm,dry. PSYCH:        Assessment & Plan:   No diagnosis found.  No orders of the defined types were placed in this encounter.   No orders of the  defined types were placed in this encounter.    Advised if symptoms do not improve or worsen, to please contact office for sooner follow up or seek emergency care.    I spent xxx minutes of dedicated to the care of this patient on the date of this encounter to include pre-visit review of records, face-to-face time with the patient discussing conditions above, post visit ordering of testing, clinical documentation with the electronic health record, making appropriate referrals as documented, and communicating necessary findings to members of the patients care team.   C. Leita Sanders, MD Advanced Bronchoscopy PCCM [facility]    *This note was dictated using voice recognition software/Dragon.  Despite best efforts to proofread, errors can occur which can change the meaning. Any transcriptional errors that result from this process are unintentional and may not be fully corrected at the time of dictation.

## 2024-01-29 NOTE — Patient Instructions (Signed)
 VISIT SUMMARY:  You came in today for an evaluation of your COPD and a pulmonary nodule. You have a history of COPD and quit smoking in 2022. You experience frequent pneumonia and use albuterol  as needed, though rarely. You also use Trelegy every morning, which helps manage your symptoms. A pulmonary nodule was identified on a CT scan in May, which has been present for several years. You have a history of emphysema noted on imaging.  YOUR PLAN:  -CHRONIC OBSTRUCTIVE PULMONARY DISEASE (COPD) WITH EMPHYSEMA: COPD is a chronic lung disease that makes it hard to breathe, and emphysema is a type of COPD that involves damage to the air sacs in the lungs. Your COPD is well-managed with daily use of Trelegy and occasional use of albuterol . Continue using Trelegy daily and albuterol  as needed. We will order pulmonary function tests to establish a baseline for your lung function.  -RIGHT LUNG PULMONARY NODULE UNDER SURVEILLANCE: A pulmonary nodule is a small growth in the lung. Your nodule has been stable but showed slight enlargement on recent imaging. We will continue to monitor it for any significant growth. A follow-up CT scan of your chest is scheduled for November. If the nodule shows significant growth, we may consider a PET scan and possibly a biopsy to gather more information.  INSTRUCTIONS:  Please continue using Trelegy daily and albuterol  as needed. We will schedule pulmonary function tests to establish a baseline for your lung function. A follow-up CT scan of your chest is scheduled for November to monitor the pulmonary nodule. If there is significant growth, we may consider a PET scan and possibly a biopsy.

## 2024-01-30 ENCOUNTER — Encounter: Payer: Self-pay | Admitting: Pulmonary Disease

## 2024-02-05 ENCOUNTER — Ambulatory Visit: Attending: Physical Therapy | Admitting: Physical Therapy

## 2024-02-05 NOTE — Therapy (Incomplete)
 OUTPATIENT PHYSICAL THERAPY NEURO EVALUATION   Patient Name: Kathryn Garrett MRN: [id number] DOB:17-Oct-1945, 78 y.o., female Today's Date: 02/05/2024   PCP: Silvano Angeline FALCON, NP REFERRING PROVIDER: Vannie Reche RAMAN, NP   END OF SESSION: ***   Past Medical History:  Diagnosis Date   Anemia, mild    Arthritis    qwhere (02/10/2016)   CAD (coronary artery disease)    a. VF arrest 01/2009/CAD with inferoposterior MI s/p aspiration thrombectomy/BMS of RCA at that time. b. ISR of BMS s/p DES to RCA 06/2010 with moderate LM/LAD disease not significant by FFR. c. s/p angiosculpt PTCA to prox RCA 01/2016 for ISR. d. Aggressive PTCA to prox RCA for ISR 05/2016.   Cardiac arrest (HCC) 01/2009   a. in setting of inf-post STEMI 01/2009 (VF).   Chest pain 06/04/2016   Chronic bronchitis (HCC)    Chronic combined systolic and diastolic CHF (congestive heart failure) (HCC)    a. EF 40-45% by cath 2010. b. 60-65% with grade 1 DD by echo 09/2015   COPD (chronic obstructive pulmonary disease) (HCC)    Hyperlipidemia 01/28/2016   Hyperlipidemia LDL goal <70 01/28/2016   Hypertension    Lung mass    MI (myocardial infarction) (HCC) 01/2009   Narcotic abuse (HCC)    pt now taking Suboxone  tid   Pleural effusion on left 09/24/2015   Pneumonia 06/2014; 09/2015   Pre-diabetes    Pulmonary nodule    PVD (peripheral vascular disease)    mild atherosclerosis of infrarenal aorta, 25% ostial left renal artery stenosis, 50% ostial right common iliac by cath 2010   S/P PTCA (percutaneous transluminal coronary angioplasty) 02/10/16 to RCA lesion for in stent restenois 02/11/2016   Subclavian artery stenosis    a. >50% by duplex 05/2016.   Tobacco abuse    Unstable angina [facility])    Past Surgical History:  Procedure Laterality Date   ABDOMINAL HYSTERECTOMY     ANKLE FRACTURE SURGERY Right 2000s   CARDIAC CATHETERIZATION N/A 02/10/2016   Procedure: Left Heart Cath and Coronary Angiography;  Surgeon: Candyce RAMAN Reek, MD;  Location: [facility] INVASIVE CV LAB;  Service: Cardiovascular;  Laterality: N/A;   CARDIAC CATHETERIZATION N/A 02/10/2016   Procedure: Coronary Balloon Angioplasty;  Surgeon: Candyce RAMAN Reek, MD;  Location: [facility] INVASIVE CV LAB;  Service: Cardiovascular;  Laterality: N/A;  instent RCA   CARDIAC CATHETERIZATION N/A 06/05/2016   Procedure: Left Heart Cath and Coronary Angiography;  Surgeon: Alm LELON Clay, MD;  Location: [facility] INVASIVE CV LAB;  Service: Cardiovascular;  Laterality: N/A;   CARDIAC CATHETERIZATION N/A 06/05/2016   Procedure: Coronary Balloon Angioplasty;  Surgeon: Alm LELON Clay, MD;  Location: [facility] INVASIVE CV LAB;  Service: Cardiovascular;  Laterality: N/A;   CHEST TUBE INSERTION N/A 10/08/2015   Procedure: PLEURX CATH REMOVAL;  Surgeon: Evalene Glasser, MD;  Location: [facility] ORS;  Service: Thoracic;  Laterality: N/A;   CORONARY ANGIOPLASTY WITH STENT PLACEMENT  01/2009; 2012   FRACTURE SURGERY     RIGHT/LEFT HEART CATH AND CORONARY ANGIOGRAPHY N/A 09/24/2020   Procedure: RIGHT/LEFT HEART CATH AND CORONARY ANGIOGRAPHY;  Surgeon: Mady Bruckner, MD;  Location: [facility] INVASIVE CV LAB;  Service: Cardiovascular;  Laterality: N/A;   VIDEO ASSISTED THORACOSCOPY (VATS)/THOROCOTOMY Left 09/29/2015   Procedure: PREOP BRONCHOSCOPY, LEFT THORACOSCOPY, POSSIBLE THORACOTOMY, PLEURAL BIOPSY, TALC ;  Surgeon: Evalene Glasser, MD;  Location: [facility] ORS;  Service: General;  Laterality: Left;   Patient Active Problem List   Diagnosis Date Noted   Fever 10/16/2023  Hypoxia 10/16/2023   Sepsis due to pneumonia (HCC) 10/13/2023   Hypothyroidism 10/13/2023   Essential hypertension 10/13/2023   Coronary artery disease 10/13/2023   SVT (supraventricular tachycardia)    Acute combined systolic and diastolic heart failure (HCC)    Pure hypercholesterolemia    Acute on chronic HFrEF (heart failure with reduced ejection fraction) (HCC)    Acute respiratory failure (HCC) 09/11/2020   HFrEF (heart failure with  reduced ejection fraction) (HCC)    Acute exacerbation of chronic obstructive pulmonary disease (COPD) (HCC) 09/10/2020   Severe sepsis (HCC) 09/10/2020   NSTEMI (non-ST elevated myocardial infarction) (HCC) 09/10/2020   Hyperglycemia 09/10/2020   SOB (shortness of breath) 09/10/2020   Chronic obstructive pulmonary disease (HCC) 03/05/2018   History of opioid abuse (HCC) 03/05/2018   Nocturnal hypoxemia 10/30/2017   Peripheral arterial disease 08/30/2016   Chest pain 06/04/2016   S/P PTCA (percutaneous transluminal coronary angioplasty) 02/10/16 to RCA lesion for in stent restenois 02/11/2016   Unstable angina (HCC)    Hyperlipidemia LDL goal <70 01/28/2016   Presence of coronary angioplasty implant and graft 10/15/2015   Tobacco abuse 10/15/2015   CAP (community acquired pneumonia) 09/24/2015   CAD in native artery 09/24/2015   HTN (hypertension) 09/24/2015   Lung nodule, solitary 03/31/2014   Liver nodule 03/31/2014    ONSET DATE: ***  REFERRING DIAG: *** I10 (ICD-10-CM) - Essential hypertension  R53.81 (ICD-10-CM) - Physical deconditioning    THERAPY DIAG: *** No diagnosis found.  Rationale for Evaluation and Treatment: Rehabilitation  SUBJECTIVE:                                                                                                                                                                                             SUBJECTIVE STATEMENT:  ***LBP and deconditioning  Pt accompanied by: {accompnied:27141}  PERTINENT HISTORY: ***  PAIN:  Are you having pain? {OPRCPAIN:27236}  PRECAUTIONS: {Therapy precautions:24002}  RED FLAGS: {PT Red Flags:29287}   WEIGHT BEARING RESTRICTIONS: {Yes ***/No:24003}  FALLS: Has patient fallen in last 6 months? {fallsyesno:27318}  LIVING ENVIRONMENT: Lives with: {OPRC lives with:25569::lives with their family} Lives in: {Lives in:25570} Stairs: {opstairs:27293} Has following equipment at home: {Assistive  devices:23999}  PLOF: {PLOF:24004}  PATIENT GOALS: ***  OBJECTIVE:  Note: Objective measures were completed at Evaluation unless otherwise noted.  DIAGNOSTIC FINDINGS: ***  COGNITION: Overall cognitive status: {cognition:24006}   SENSATION: {sensation:27233}  COORDINATION: ***  EDEMA:  {edema:24020}  MUSCLE TONE: {LE tone:25568}  MUSCLE LENGTH: Hamstrings: Right *** deg; Left *** deg Thomas test: Right *** deg; Left *** deg  DTRs:  {DTR SITE:24025}  POSTURE: {posture:25561}  LOWER EXTREMITY ROM:     {AROM/PROM:27142}  Right Eval Left Eval  Hip flexion    Hip extension    Hip abduction    Hip adduction    Hip internal rotation    Hip external rotation    Knee flexion    Knee extension    Ankle dorsiflexion    Ankle plantarflexion    Ankle inversion    Ankle eversion     (Blank rows = not tested)  LOWER EXTREMITY MMT:    MMT Right Eval Left Eval  Hip flexion    Hip extension    Hip abduction    Hip adduction    Hip internal rotation    Hip external rotation    Knee flexion    Knee extension    Ankle dorsiflexion    Ankle plantarflexion    Ankle inversion    Ankle eversion    (Blank rows = not tested)  BED MOBILITY:  {bed mobility:32615:p}  TRANSFERS: {transfers eval:32620}  RAMP:  {ramp eval:32616}  CURB:  {curb eval:32617}  STAIRS: {stairs eval:32618} GAIT: Findings: {GaitneuroPT:32644::Distance walked: ***,Comments: ***}  FUNCTIONAL TESTS:  {Functional tests:24029}  PATIENT SURVEYS:  {rehab surveys:24030}                                                                                                                              TREATMENT DATE: 02/05/2024  ***    PATIENT EDUCATION: Education details: *** Person educated: {Person educated:25204} Education method: {Education Method:25205} Education comprehension: {Education Comprehension:25206}  HOME EXERCISE PROGRAM: ***  GOALS: Goals reviewed with  patient? Yes  SHORT TERM GOALS: Target date: 03/18/2024  *** Baseline: Goal status: INITIAL   LONG TERM GOALS: Target date: 04/29/2024***  *** Baseline:  Goal status: INITIAL  2.  *** Baseline:  Goal status: INITIAL  3.  *** Baseline:  Goal status: INITIAL  4.  *** Baseline:  Goal status: INITIAL  5.  *** Baseline:  Goal status: INITIAL  6.  *** Baseline:  Goal status: INITIAL  ASSESSMENT:  CLINICAL IMPRESSION: Patient is a 78 y.o. female who was seen today for physical therapy evaluation and treatment for ***.   OBJECTIVE IMPAIRMENTS: {opptimpairments:25111}.   ACTIVITY LIMITATIONS: {activitylimitations:27494}  PARTICIPATION LIMITATIONS: {participationrestrictions:25113}  PERSONAL FACTORS: {Personal factors:25162} are also affecting patient's functional outcome.   REHAB POTENTIAL: {rehabpotential:25112}  CLINICAL DECISION MAKING: {clinical decision making:25114}  EVALUATION COMPLEXITY: {Evaluation complexity:25115}  PLAN:  PT FREQUENCY: {rehab frequency:25116}  PT DURATION: {rehab duration:25117}  PLANNED INTERVENTIONS: {rehab planned interventions:25118::97110-Therapeutic exercises,97530- Therapeutic [phone]- Neuromuscular re-education,97535- Self Rjmz,02859- Manual therapy,Patient/Family education}  PLAN FOR NEXT SESSION: ***    Regana Kemple, PT, DPT, NCS, CSRS Physical Therapist -   [facility]  9:33 AM 02/05/24

## 2024-02-12 ENCOUNTER — Ambulatory Visit: Admitting: Physical Therapy

## 2024-02-18 ENCOUNTER — Ambulatory Visit: Admitting: Physical Therapy

## 2024-02-19 ENCOUNTER — Ambulatory Visit: Admitting: Physical Therapy

## 2024-02-26 ENCOUNTER — Ambulatory Visit

## 2024-03-04 ENCOUNTER — Ambulatory Visit: Admitting: Physical Therapy

## 2024-03-10 ENCOUNTER — Ambulatory Visit

## 2024-03-10 DIAGNOSIS — R0989 Other specified symptoms and signs involving the circulatory and respiratory systems: Secondary | ICD-10-CM

## 2024-03-10 LAB — PULMONARY FUNCTION TEST
DL/VA % pred: 79 %
DL/VA: 3.41 ml/min/mmHg/L
DLCO unc % pred: 70 %
DLCO unc: 11.04 ml/min/mmHg
FEF 25-75 Post: 0.73 L/s
FEF 25-75 Pre: 0.93 L/s
FEF2575-%Change-Post: -22 %
FEF2575-%Pred-Post: 61 %
FEF2575-%Pred-Pre: 78 %
FEV1-%Change-Post: -7 %
FEV1-%Pred-Post: 92 %
FEV1-%Pred-Pre: 99 %
FEV1-Post: 1.41 L
FEV1-Pre: 1.52 L
FEV1FVC-%Change-Post: -8 %
FEV1FVC-%Pred-Pre: 91 %
FEV6-%Change-Post: 0 %
FEV6-%Pred-Post: 116 %
FEV6-%Pred-Pre: 115 %
FEV6-Post: 2.26 L
FEV6-Pre: 2.24 L
FEV6FVC-%Change-Post: 0 %
FEV6FVC-%Pred-Post: 105 %
FEV6FVC-%Pred-Pre: 106 %
FVC-%Change-Post: 1 %
FVC-%Pred-Post: 110 %
FVC-%Pred-Pre: 108 %
FVC-Post: 2.28 L
FVC-Pre: 2.24 L
Post FEV1/FVC ratio: 62 %
Post FEV6/FVC ratio: 99 %
Pre FEV1/FVC ratio: 68 %
Pre FEV6/FVC Ratio: 100 %
RV % pred: 75 %
RV: 1.6 L
TLC % pred: 87 %
TLC: 3.77 L

## 2024-03-10 NOTE — Progress Notes (Signed)
 Full PFT completed today ? ?

## 2024-03-10 NOTE — Patient Instructions (Signed)
 Full PFT completed today ? ?

## 2024-03-11 ENCOUNTER — Emergency Department

## 2024-03-11 ENCOUNTER — Ambulatory Visit: Admitting: Physical Therapy

## 2024-03-11 ENCOUNTER — Inpatient Hospital Stay
Admission: EM | Admit: 2024-03-11 | Discharge: 2024-03-13 | DRG: 193 | Disposition: A | Discharge status: Home / Self Care

## 2024-03-11 DIAGNOSIS — Z955 Presence of coronary angioplasty implant and graft: Secondary | ICD-10-CM

## 2024-03-11 DIAGNOSIS — Z87440 Personal history of urinary (tract) infections: Secondary | ICD-10-CM

## 2024-03-11 DIAGNOSIS — Z791 Long term (current) use of non-steroidal anti-inflammatories (NSAID): Secondary | ICD-10-CM

## 2024-03-11 DIAGNOSIS — E039 Hypothyroidism, unspecified: Secondary | ICD-10-CM | POA: Diagnosis present

## 2024-03-11 DIAGNOSIS — Z881 Allergy status to other antibiotic agents status: Secondary | ICD-10-CM

## 2024-03-11 DIAGNOSIS — R7303 Prediabetes: Secondary | ICD-10-CM | POA: Diagnosis present

## 2024-03-11 DIAGNOSIS — Z8674 Personal history of sudden cardiac arrest: Secondary | ICD-10-CM

## 2024-03-11 DIAGNOSIS — I2510 Atherosclerotic heart disease of native coronary artery without angina pectoris: Secondary | ICD-10-CM | POA: Diagnosis present

## 2024-03-11 DIAGNOSIS — Z7902 Long term (current) use of antithrombotics/antiplatelets: Secondary | ICD-10-CM | POA: Diagnosis not present

## 2024-03-11 DIAGNOSIS — E785 Hyperlipidemia, unspecified: Secondary | ICD-10-CM | POA: Diagnosis present

## 2024-03-11 DIAGNOSIS — R710 Precipitous drop in hematocrit: Secondary | ICD-10-CM | POA: Diagnosis present

## 2024-03-11 DIAGNOSIS — Z1152 Encounter for screening for COVID-19: Secondary | ICD-10-CM | POA: Diagnosis not present

## 2024-03-11 DIAGNOSIS — I5042 Chronic combined systolic (congestive) and diastolic (congestive) heart failure: Secondary | ICD-10-CM | POA: Diagnosis present

## 2024-03-11 DIAGNOSIS — J441 Chronic obstructive pulmonary disease with (acute) exacerbation: Secondary | ICD-10-CM | POA: Diagnosis present

## 2024-03-11 DIAGNOSIS — I739 Peripheral vascular disease, unspecified: Secondary | ICD-10-CM | POA: Diagnosis present

## 2024-03-11 DIAGNOSIS — Z83438 Family history of other disorder of lipoprotein metabolism and other lipidemia: Secondary | ICD-10-CM

## 2024-03-11 DIAGNOSIS — Z90710 Acquired absence of both cervix and uterus: Secondary | ICD-10-CM

## 2024-03-11 DIAGNOSIS — Z9861 Coronary angioplasty status: Secondary | ICD-10-CM

## 2024-03-11 DIAGNOSIS — Z8249 Family history of ischemic heart disease and other diseases of the circulatory system: Secondary | ICD-10-CM | POA: Diagnosis not present

## 2024-03-11 DIAGNOSIS — B9789 Other viral agents as the cause of diseases classified elsewhere: Secondary | ICD-10-CM | POA: Diagnosis present

## 2024-03-11 DIAGNOSIS — Z79899 Other long term (current) drug therapy: Secondary | ICD-10-CM

## 2024-03-11 DIAGNOSIS — I110 Hypertensive heart disease with heart failure: Secondary | ICD-10-CM | POA: Diagnosis present

## 2024-03-11 DIAGNOSIS — J189 Pneumonia, unspecified organism: Secondary | ICD-10-CM | POA: Diagnosis present

## 2024-03-11 DIAGNOSIS — Z79891 Long term (current) use of opiate analgesic: Secondary | ICD-10-CM

## 2024-03-11 DIAGNOSIS — I252 Old myocardial infarction: Secondary | ICD-10-CM | POA: Diagnosis not present

## 2024-03-11 DIAGNOSIS — I701 Atherosclerosis of renal artery: Secondary | ICD-10-CM | POA: Diagnosis present

## 2024-03-11 DIAGNOSIS — J440 Chronic obstructive pulmonary disease with acute lower respiratory infection: Secondary | ICD-10-CM | POA: Diagnosis present

## 2024-03-11 DIAGNOSIS — J9601 Acute respiratory failure with hypoxia: Secondary | ICD-10-CM | POA: Diagnosis present

## 2024-03-11 DIAGNOSIS — A419 Sepsis, unspecified organism: Principal | ICD-10-CM

## 2024-03-11 DIAGNOSIS — Z87891 Personal history of nicotine dependence: Secondary | ICD-10-CM | POA: Diagnosis not present

## 2024-03-11 DIAGNOSIS — Z888 Allergy status to other drugs, medicaments and biological substances status: Secondary | ICD-10-CM

## 2024-03-11 DIAGNOSIS — J449 Chronic obstructive pulmonary disease, unspecified: Secondary | ICD-10-CM | POA: Diagnosis present

## 2024-03-11 DIAGNOSIS — I10 Essential (primary) hypertension: Secondary | ICD-10-CM | POA: Diagnosis present

## 2024-03-11 LAB — COMPREHENSIVE METABOLIC PANEL WITH GFR
ALT: 14 U/L (ref 0–44)
AST: 18 U/L (ref 15–41)
Albumin: 3.9 g/dL (ref 3.5–5.0)
Alkaline Phosphatase: 64 U/L (ref 38–126)
Anion gap: 13 (ref 5–15)
BUN: 22 mg/dL (ref 8–23)
CO2: 24 mmol/L (ref 22–32)
Calcium: 9.2 mg/dL (ref 8.9–10.3)
Chloride: 103 mmol/L (ref 98–111)
Creatinine, Ser: 0.8 mg/dL (ref 0.44–1.00)
GFR, Estimated: >60 mL/min (ref >60)
Glucose, Bld: 94 mg/dL (ref 70–99)
Potassium: 4.2 mmol/L (ref 3.5–5.1)
Sodium: 140 mmol/L (ref 135–145)
Total Bilirubin: 0.7 mg/dL (ref 0.0–1.2)
Total Protein: 7.3 g/dL (ref 6.5–8.1)

## 2024-03-11 LAB — URINALYSIS, ROUTINE W REFLEX MICROSCOPIC
Bilirubin Urine: NEGATIVE
Glucose, UA: NEGATIVE mg/dL
Hgb urine dipstick: NEGATIVE
Ketones, ur: NEGATIVE mg/dL
Leukocytes,Ua: NEGATIVE
Nitrite: NEGATIVE
Protein, ur: NEGATIVE mg/dL
Specific Gravity, Urine: 1.013 (ref 1.005–1.030)
pH: 7 (ref 5.0–8.0)

## 2024-03-11 LAB — CBC WITH DIFFERENTIAL/PLATELET
Abs Immature Granulocytes: 0.11 K/uL — ABNORMAL HIGH (ref 0.00–0.07)
Basophils Absolute: 0.1 K/uL (ref 0.0–0.1)
Basophils Relative: 0 %
Eosinophils Absolute: 0.1 K/uL (ref 0.0–0.5)
Eosinophils Relative: 0 %
HCT: 39.4 % (ref 36.0–46.0)
Hemoglobin: 12.2 g/dL (ref 12.0–15.0)
Immature Granulocytes: 1 %
Lymphocytes Relative: 8 %
Lymphs Abs: 1.6 K/uL (ref 0.7–4.0)
MCH: 29.7 pg (ref 26.0–34.0)
MCHC: 31 g/dL (ref 30.0–36.0)
MCV: 95.9 fL (ref 80.0–100.0)
Monocytes Absolute: 1.3 K/uL — ABNORMAL HIGH (ref 0.1–1.0)
Monocytes Relative: 6 %
Neutro Abs: 17.2 K/uL — ABNORMAL HIGH (ref 1.7–7.7)
Neutrophils Relative %: 85 %
Platelets: 269 K/uL (ref 150–400)
RBC: 4.11 MIL/uL (ref 3.87–5.11)
RDW: 13.8 % (ref 11.5–15.5)
WBC: 20.3 K/uL — ABNORMAL HIGH (ref 4.0–10.5)
nRBC: 0 % (ref 0.0–0.2)

## 2024-03-11 LAB — RESP PANEL BY RT-PCR (RSV, FLU A&B, COVID)  RVPGX2
Influenza A by PCR: NEGATIVE
Influenza B by PCR: NEGATIVE
Resp Syncytial Virus by PCR: NEGATIVE
SARS Coronavirus 2 by RT PCR: NEGATIVE

## 2024-03-11 LAB — MAGNESIUM: Magnesium: 2.1 mg/dL (ref 1.7–2.4)

## 2024-03-11 LAB — LACTIC ACID, PLASMA: Lactic Acid, Venous: 1.9 mmol/L (ref 0.5–1.9)

## 2024-03-11 LAB — PROTIME-INR
INR: 1.3 — ABNORMAL HIGH (ref 0.8–1.2)
Prothrombin Time: 16.6 s — ABNORMAL HIGH (ref 11.4–15.2)

## 2024-03-11 MED ORDER — LACTATED RINGERS IV BOLUS
1000.0000 mL | Freq: Once | INTRAVENOUS | Status: AC
  Administered 2024-03-11: 1000 mL via INTRAVENOUS

## 2024-03-11 MED ORDER — ACETAMINOPHEN 325 MG PO TABS: 650.0000 mg | ORAL_TABLET | Freq: Four times a day (QID) | ORAL | Status: DC | PRN

## 2024-03-11 MED ORDER — SENNOSIDES-DOCUSATE SODIUM 8.6-50 MG PO TABS
1.0000 | ORAL_TABLET | Freq: Every evening | ORAL | Status: DC | PRN
Start: 2024-03-11 — End: 2024-03-13

## 2024-03-11 MED ORDER — SODIUM CHLORIDE 0.9 % IV SOLN
2.0000 g | Freq: Once | INTRAVENOUS | Status: AC
  Administered 2024-03-11: 2 g via INTRAVENOUS
  Filled 2024-03-11: qty 12.5

## 2024-03-11 MED ORDER — ACETAMINOPHEN 650 MG RE SUPP: 650.0000 mg | Freq: Four times a day (QID) | RECTAL | Status: DC | PRN

## 2024-03-11 MED ORDER — ACETAMINOPHEN 500 MG PO TABS
1000.0000 mg | ORAL_TABLET | Freq: Once | ORAL | Status: AC
  Administered 2024-03-11: 1000 mg via ORAL
  Filled 2024-03-11: qty 2

## 2024-03-11 MED ORDER — SODIUM CHLORIDE 0.9% FLUSH
3.0000 mL | Freq: Two times a day (BID) | INTRAVENOUS | Status: DC
  Administered 2024-03-11 – 2024-03-13 (×4): 3 mL via INTRAVENOUS

## 2024-03-11 MED ORDER — METRONIDAZOLE 500 MG/100ML IV SOLN
500.0000 mg | Freq: Once | INTRAVENOUS | Status: AC
  Administered 2024-03-11: 500 mg via INTRAVENOUS
  Filled 2024-03-11: qty 100

## 2024-03-11 MED ORDER — ENOXAPARIN SODIUM 40 MG/0.4ML IJ SOSY
40.0000 mg | PREFILLED_SYRINGE | INTRAMUSCULAR | Status: DC
  Administered 2024-03-12 – 2024-03-13 (×2): 40 mg via SUBCUTANEOUS
  Filled 2024-03-11 (×2): qty 0.4

## 2024-03-11 MED ORDER — VANCOMYCIN HCL IN DEXTROSE 1-5 GM/200ML-% IV SOLN
1000.0000 mg | Freq: Once | INTRAVENOUS | Status: AC
  Administered 2024-03-11: 1000 mg via INTRAVENOUS
  Filled 2024-03-11: qty 200

## 2024-03-11 MED ORDER — IPRATROPIUM-ALBUTEROL 0.5-2.5 (3) MG/3ML IN SOLN
3.0000 mL | Freq: Four times a day (QID) | RESPIRATORY_TRACT | Status: DC
  Administered 2024-03-12 (×2): 3 mL via RESPIRATORY_TRACT
  Filled 2024-03-11 (×2): qty 3

## 2024-03-11 NOTE — Progress Notes (Signed)
 CODE SEPSIS - PHARMACY COMMUNICATION  **Broad Spectrum Antibiotics should be administered within 1 hour of Sepsis diagnosis**  Time Code Sepsis Called/Page Received: 20:03  Antibiotics Ordered: Cefepime , Vancomycin , Flagyl   Time of 1st antibiotic administration: 20:09   Ransom Blanch PGY-1 Pharmacy Resident  [place] [facility]  03/11/2024 8:18 PM

## 2024-03-11 NOTE — Sepsis Progress Note (Signed)
 Elink monitoring for the code sepsis protocol.

## 2024-03-11 NOTE — ED Provider Notes (Signed)
 [name] Provider Note    Event Date/Time   First MD Initiated Contact with Patient 03/11/24 1939     (approximate)   History   Chief Complaint Fever (Family noted pt to be lethargic/lethargic. Just completed a course of antibiotics for UTI and PNA. Pt has no complaints at moment and does not know why she's here in hospital. )   HPI  Kathryn Garrett is a 78 y.o. female with past medical history of hypertension, CAD, HFrEF, SVT, COPD, and chronic opiate use who presents to the ED complaining of fever.  Per EMS, family noted that patient was more lethargic and slow to respond today than she had been previously.  EMS was told that patient recently completed antibiotics for UTI and pneumonia, but they are unsure what the name of the antibiotic is.  Patient currently endorses a productive cough, otherwise states that she feels well with no other complaints.  She is not aware of any fevers and denies any chest pain, shortness of breath, nausea, vomiting, diarrhea, or dysuria.     Physical Exam   Triage Vital Signs: ED Triage Vitals  Encounter Vitals Group     BP 03/11/24 1945 (!) 201/78     Girls Systolic BP Percentile --      Girls Diastolic BP Percentile --      Boys Systolic BP Percentile --      Boys Diastolic BP Percentile --      Pulse Rate 03/11/24 1945 100     Resp 03/11/24 1945 20     Temp 03/11/24 1945 (!) 102.6 F (39.2 C)     Temp Source 03/11/24 1945 Oral     SpO2 03/11/24 1945 99 %     Weight 03/11/24 1946 137 lb 6.4 oz (62.3 kg)     Height 03/11/24 1946 4' 11 (1.499 m)     Head Circumference --      Peak Flow --      Pain Score 03/11/24 1946 0     Pain Loc --      Pain Education --      Exclude from Growth Chart --     Most recent vital signs: Vitals:   03/11/24 2200 03/11/24 2325  BP: (!) 130/51 (!) 140/44  Pulse: 89 71  Resp: 18 18  Temp: 99.3 F (37.4 C) 98.2 F (36.8 C)  SpO2: 97% 99%    Constitutional: Somnolent  but arousable to voice, oriented to person, place, time, and situation. Eyes: Conjunctivae are normal. Head: Atraumatic. Nose: No congestion/rhinnorhea. Mouth/Throat: Mucous membranes are moist.  Cardiovascular: Normal rate, regular rhythm. Grossly normal heart sounds.  2+ radial pulses bilaterally. Respiratory: Normal respiratory effort.  No retractions. Lungs CTAB. Gastrointestinal: Soft and nontender. No distention. Musculoskeletal: No lower extremity tenderness nor edema.  Neurologic:  Normal speech and language. No gross focal neurologic deficits are appreciated.    ED Results / Procedures / Treatments   Labs (all labs ordered are listed, but only abnormal results are displayed) Labs Reviewed  URINALYSIS, ROUTINE W REFLEX MICROSCOPIC - Abnormal; Notable for the following components:      Result Value   Color, Urine YELLOW (*)    APPearance CLEAR (*)    All other components within normal limits  CBC WITH DIFFERENTIAL/PLATELET - Abnormal; Notable for the following components:   WBC 20.3 (*)    Neutro Abs 17.2 (*)    Monocytes Absolute 1.3 (*)    Abs Immature Granulocytes 0.11 (*)  All other components within normal limits  RESP PANEL BY RT-PCR (RSV, FLU A&B, COVID)  RVPGX2  CULTURE, BLOOD (ROUTINE X 2)  CULTURE, BLOOD (ROUTINE X 2)  RESPIRATORY PANEL BY PCR  LACTIC ACID, PLASMA  COMPREHENSIVE METABOLIC PANEL WITH GFR  MAGNESIUM   CBC WITH DIFFERENTIAL/PLATELET  PROTIME-INR  BASIC METABOLIC PANEL WITH GFR  CBC     EKG  ED ECG REPORT I, Carlin Palin, the attending physician, personally viewed and interpreted this ECG.   Date: 03/11/2024  EKG Time: 19:46  Rate: 98  Rhythm: normal sinus rhythm  Axis: Normal  Intervals:none  ST&T Change: None  RADIOLOGY Chest x-ray reviewed and interpreted by me with patchy opacities concerning for pneumonia.  PROCEDURES:  Critical Care performed: Yes, see critical care procedure note(s)  .Critical Care  Performed by:  Palin Carlin, MD Authorized by: Palin Carlin, MD   Critical care provider statement:    Critical care time (minutes):  30   Critical care time was exclusive of:  Separately billable procedures and treating other patients and teaching time   Critical care was necessary to treat or prevent imminent or life-threatening deterioration of the following conditions:  Respiratory failure   Critical care was time spent personally by me on the following activities:  Development of treatment plan with patient or surrogate, discussions with consultants, evaluation of patient's response to treatment, examination of patient, ordering and review of laboratory studies, ordering and review of radiographic studies, ordering and performing treatments and interventions, pulse oximetry, re-evaluation of patient's condition and review of old charts   I assumed direction of critical care for this patient from another provider in my specialty: no     Care discussed with: admitting provider      MEDICATIONS ORDERED IN ED: Medications  sodium chloride  flush (NS) 0.9 % injection 3 mL (has no administration in time range)  acetaminophen  (TYLENOL ) tablet 650 mg (has no administration in time range)    Or  acetaminophen  (TYLENOL ) suppository 650 mg (has no administration in time range)  senna-docusate (Senokot-S) tablet 1 tablet (has no administration in time range)  enoxaparin  (LOVENOX ) injection 40 mg (has no administration in time range)  ipratropium-albuterol  (DUONEB) 0.5-2.5 (3) MG/3ML nebulizer solution 3 mL (3 mLs Nebulization Not Given 03/11/24 2334)  ceFEPIme  (MAXIPIME ) 2 g in sodium chloride  0.9 % 100 mL IVPB (0 g Intravenous Stopped 03/11/24 2040)  metroNIDAZOLE  (FLAGYL ) IVPB 500 mg (0 mg Intravenous Stopped 03/11/24 2142)  vancomycin  (VANCOCIN ) IVPB 1000 mg/200 mL premix (0 mg Intravenous Stopped 03/11/24 2318)  lactated ringers  bolus 1,000 mL (1,000 mLs Intravenous New Bag/Given 03/11/24 2008)   acetaminophen  (TYLENOL ) tablet 1,000 mg (1,000 mg Oral Given 03/11/24 2058)     IMPRESSION / MDM / ASSESSMENT AND PLAN / ED COURSE  I reviewed the triage vital signs and the nursing notes.                              78 y.o. female with past medical history of hypertension, CAD, HFrEF, SVT, COPD, and chronic opiate use who presents to the ED complaining of lethargy and cough.  Patient's presentation is most consistent with acute presentation with potential threat to life or bodily function.  Differential diagnosis includes, but is not limited to, sepsis, pneumonia, COVID-19, influenza, UTI, anemia, electrolyte abnormality, AKI, stroke, TIA.  Patient nontoxic-appearing and in no acute distress, vital signs remarkable for fever and mild tachycardia.  Patient meets sepsis criteria and  we will start on broad-spectrum antibiotics, but she is not in any respiratory distress and maintaining oxygen  saturations at 99% on room air.  Chest x-ray and urinalysis are pending in addition to labs, including blood cultures and lactic acid.  Labs with significant leukocytosis but no anemia, electrolyte abnormality, or AKI.  LFTs are unremarkable and lactic acid within normal limits.  Chest x-ray suggestive of pneumonia and patient has received broad-spectrum antibiotics.  Case discussed with hospitalist for admission.      FINAL CLINICAL IMPRESSION(S) / ED DIAGNOSES   Final diagnoses:  Sepsis without acute organ dysfunction, due to unspecified organism (HCC)  Pneumonia due to infectious organism, unspecified laterality, unspecified part of lung     Rx / DC Orders   ED Discharge Orders     None        Note:  This document was prepared using Dragon voice recognition software and may include unintentional dictation errors.   Willo Dunnings, MD 03/11/24 [phone]

## 2024-03-11 NOTE — ED Triage Notes (Signed)
  Family noted pt to be lethargic/lethargic. Just completed a course of antibiotics for UTI and PNA. Pt has no complaints at moment and does not know why she's here in hospital. Answering questions appropriately, no distress noted. Speech clear. Moving all extremities well. Remains on 2L Impact.

## 2024-03-11 NOTE — H&P (Signed)
 History and Physical    Kathryn Garrett FMW:[id number] DOB: 10/12/1945 DOA: 03/11/2024  DOS: the patient was seen and examined on 03/11/2024  PCP: Silvano Angeline FALCON, NP   Patient coming from: Home  I have personally briefly reviewed patient's old medical records in [facility] and CareEverywhere  HPI:   Kathryn Garrett is a 78 y.o. year old female with past medical history of hypertension, hyperlipidemia, HFpEF (EF 55% in 06/25), tobacco use disorder presenting with report of lethargy by family and fevers found to have acute hypoxic respiratory failure secondary to pneumonia.   Pt states she developed flulike symptoms including congestion, cough, fevers and sore throat and decided to come to the ED at the request of her family.  She had an appointment with her PCP today but instead came here because she was not feeling well.  She states her cough is productive at times.  She denies any sick contacts.  States she was told she had pneumonia and completed antibiotics and started feeling better after she completed them.  She did not know the name of the antibiotic but on chart review it appears she was prescribed doxycycline  on 02/27/2024. When I inquired about urinary symptoms she states she did not have any currently.   On arrival to the ED patient was noted to be HDS stable.  Initial lab work showed CBC with leukocytosis at 20.3, CMP unremarkable, respiratory panel negative COVID, flu, RSV and urinalysis without sign of urinary tract infection.  Lactic acid normal.  Chest x-ray shows mild ground glass and reticular opacities bilateral bases.  Patient was given cefepime , Flagyl , vancomycin  by EDP.  Given her acute hypoxic respiratory failure, TRH consulted for admission.   Review of Systems: As mentioned in the history of present illness. All other systems reviewed and are negative.   Past Medical History:  Diagnosis Date   Anemia, mild    Arthritis    qwhere (02/10/2016)   CAD  (coronary artery disease)    a. VF arrest 01/2009/CAD with inferoposterior MI s/p aspiration thrombectomy/BMS of RCA at that time. b. ISR of BMS s/p DES to RCA 06/2010 with moderate LM/LAD disease not significant by FFR. c. s/p angiosculpt PTCA to prox RCA 01/2016 for ISR. d. Aggressive PTCA to prox RCA for ISR 05/2016.   Cardiac arrest (HCC) 01/2009   a. in setting of inf-post STEMI 01/2009 (VF).   Chest pain 06/04/2016   Chronic bronchitis (HCC)    Chronic combined systolic and diastolic CHF (congestive heart failure) (HCC)    a. EF 40-45% by cath 2010. b. 60-65% with grade 1 DD by echo 09/2015   COPD (chronic obstructive pulmonary disease) (HCC)    Hyperlipidemia 01/28/2016   Hyperlipidemia LDL goal <70 01/28/2016   Hypertension    Lung mass    MI (myocardial infarction) (HCC) 01/2009   Narcotic abuse (HCC)    pt now taking Suboxone  tid   Pleural effusion on left 09/24/2015   Pneumonia 06/2014; 09/2015   Pre-diabetes    Pulmonary nodule    PVD (peripheral vascular disease)    mild atherosclerosis of infrarenal aorta, 25% ostial left renal artery stenosis, 50% ostial right common iliac by cath 2010   S/P PTCA (percutaneous transluminal coronary angioplasty) 02/10/16 to RCA lesion for in stent restenois 02/11/2016   Subclavian artery stenosis    a. >50% by duplex 05/2016.   Tobacco abuse    Unstable angina [facility])     Past Surgical History:  Procedure Laterality  Date   ABDOMINAL HYSTERECTOMY     ANKLE FRACTURE SURGERY Right 2000s   CARDIAC CATHETERIZATION N/A 02/10/2016   Procedure: Left Heart Cath and Coronary Angiography;  Surgeon: Candyce GORMAN Reek, MD;  Location: [facility] INVASIVE CV LAB;  Service: Cardiovascular;  Laterality: N/A;   CARDIAC CATHETERIZATION N/A 02/10/2016   Procedure: Coronary Balloon Angioplasty;  Surgeon: Candyce GORMAN Reek, MD;  Location: [facility] INVASIVE CV LAB;  Service: Cardiovascular;  Laterality: N/A;  instent RCA   CARDIAC CATHETERIZATION N/A 06/05/2016   Procedure: Left  Heart Cath and Coronary Angiography;  Surgeon: Alm LELON Clay, MD;  Location: [facility] INVASIVE CV LAB;  Service: Cardiovascular;  Laterality: N/A;   CARDIAC CATHETERIZATION N/A 06/05/2016   Procedure: Coronary Balloon Angioplasty;  Surgeon: Alm LELON Clay, MD;  Location: [facility] INVASIVE CV LAB;  Service: Cardiovascular;  Laterality: N/A;   CHEST TUBE INSERTION N/A 10/08/2015   Procedure: PLEURX CATH REMOVAL;  Surgeon: Evalene Glasser, MD;  Location: [facility] ORS;  Service: Thoracic;  Laterality: N/A;   CORONARY ANGIOPLASTY WITH STENT PLACEMENT  01/2009; 2012   FRACTURE SURGERY     RIGHT/LEFT HEART CATH AND CORONARY ANGIOGRAPHY N/A 09/24/2020   Procedure: RIGHT/LEFT HEART CATH AND CORONARY ANGIOGRAPHY;  Surgeon: Mady Bruckner, MD;  Location: [facility] INVASIVE CV LAB;  Service: Cardiovascular;  Laterality: N/A;   VIDEO ASSISTED THORACOSCOPY (VATS)/THOROCOTOMY Left 09/29/2015   Procedure: PREOP BRONCHOSCOPY, LEFT THORACOSCOPY, POSSIBLE THORACOTOMY, PLEURAL BIOPSY, TALC ;  Surgeon: Evalene Glasser, MD;  Location: [facility] ORS;  Service: General;  Laterality: Left;     Allergies  Allergen Reactions   Pregabalin Other (See Comments)    'Makes me extremely sedated'   Azithromycin  Rash   Doxycycline  Nausea Only    Family History  Problem Relation Age of Onset   Coronary artery disease Father    Hyperlipidemia Father    Early death Father    Heart attack Father    Coronary artery disease Mother    Hyperlipidemia Mother    Heart attack Mother    Cancer Brother     Prior to Admission medications   Medication Sig Start Date End Date Taking? Authorizing Provider  acetaminophen  (TYLENOL ) 500 MG tablet Take 1,000 mg by mouth every 6 (six) hours as needed for headache.    [provider]  albuterol  (PROVENTIL  HFA;VENTOLIN  HFA) 108 (90 Base) MCG/ACT inhaler Inhale 2 puffs into the lungs every 4 (four) hours as needed for wheezing or shortness of breath.    [provider]  amLODipine (NORVASC) 2.5 MG  tablet Take 2.5 mg by mouth daily. 01/15/24   [provider]  aspirin  EC 81 MG tablet Take 81 mg by mouth at bedtime.     [provider]  azelastine  (ASTELIN ) 0.1 % nasal spray Place 2 sprays into both nostrils daily. 08/14/17   [provider]  Buprenorphine  HCl-Naloxone  HCl 8-2 MG FILM Place 0.5 Film under the tongue daily.    [provider]  cetirizine  (ZYRTEC ) 10 MG tablet Take 1 tablet (10 mg total) by mouth daily. 03/11/18   [name] HERO, PA-C  Cholecalciferol 1.25 MG (50000 UT) TABS Take 1 tab(s) orally once a week 01/28/23   [provider]  clopidogrel  (PLAVIX ) 75 MG tablet Take 1 tablet (75 mg total) by mouth daily. 02/13/22   Walker, Caitlin S, NP  estradiol (ESTRACE) 0.1 MG/GM vaginal cream Place 1 Applicatorful vaginally. 10/11/23   [provider]  furosemide  (LASIX ) 20 MG tablet Take 1 tablet (20 mg total) by mouth daily. MAY  TAKE AN EXTRA 20 MG AS NEEDED FOR 2 LB WEIGHT GAIN IN 24 OR 5 LB WEIGHT GAIN IN 5 DAYS 07/03/22   Walker, Caitlin S, NP  losartan  (COZAAR ) 100 MG tablet Take 100 mg by mouth daily. Patient not taking: Reported on 01/29/2024    [provider]  meloxicam (MOBIC) 7.5 MG tablet Take 7.5 mg by mouth daily. 10/16/23   [provider]  metoprolol  succinate (TOPROL -XL) 50 MG 24 hr tablet Take 1 tablet (50 mg total) by mouth daily. Take with or immediately following a meal. 03/09/23   Vannie Reche RAMAN, NP  nitroGLYCERIN  (NITROSTAT ) 0.4 MG SL tablet Place 1 tablet (0.4 mg total) under the tongue every 5 (five) minutes as needed for chest pain. 11/02/20 01/29/24  Walker, Caitlin S, NP  nystatin  cream (MYCOSTATIN ) Apply topically 2 (two) times daily. 07/06/22   [provider]  olmesartan  (BENICAR ) 40 MG tablet Take 1 tablet (40 mg total) by mouth daily. 01/28/24   Vannie Reche RAMAN, NP  pantoprazole  (PROTONIX ) 40 MG tablet Take 40 mg by mouth daily.    [provider]  rosuvastatin  (CRESTOR )  20 MG tablet Take 1 tablet (20 mg total) by mouth daily. 08/09/23   Vannie Reche RAMAN, NP  TRELEGY ELLIPTA 100-62.5-25 MCG/INH AEPB Take 1 puff by mouth daily. 08/13/20   [provider]  triamcinolone cream (KENALOG) 0.1 % Apply 1 application topically 2 (two) times daily. to affected area 08/11/20   [provider]    Social History:  reports that she quit smoking about 3 years ago. Her smoking use included cigarettes. She started smoking about 59 years ago. She has a 28 pack-year smoking history. She has never used smokeless tobacco. She reports that she does not drink alcohol and does not use drugs. Lives by herself Tobacco-previous smoking history but quit since 2022 EtOH-drinks a glass of wine every 6 months Illicit drug use- denies use.  IADLs/ADLs- can perform independently at baseline    Physical Exam: Vitals:   03/11/24 2015 03/11/24 2130 03/11/24 2200 03/11/24 2325  BP: (!) 164/103 (!) 150/53 (!) 130/51 (!) 140/44  Pulse: 99 92 89 71  Resp: 17 16 18 18   Temp:   99.3 F (37.4 C) 98.2 F (36.8 C)  TempSrc:   Oral Oral  SpO2: 100% 97% 97% 99%  Weight:      Height:         Gen: NAD HENT: NCAT, nasal cannula in place CV: RRR, good pulses Resp: CTAB, no wheeze or rales present Abd: No TTP, normal bowel sounds MSK: No asymmetry, good bulk and tone Skin: No rash on examined skin Neuro: Alert and oriented x 4 Psych: Pleasant mood   Labs on Admission: I have personally reviewed following labs and imaging studies  CBC: Recent Labs  Lab 03/11/24 2000  WBC 20.3*  NEUTROABS 17.2*  HGB 12.2  HCT 39.4  MCV 95.9  PLT 269   Basic Metabolic Panel: Recent Labs  Lab 03/11/24 2000  NA 140  K 4.2  CL 103  CO2 24  GLUCOSE 94  BUN 22  CREATININE 0.80  CALCIUM  9.2  MG 2.1   GFR: Estimated Creatinine Clearance: 45.7 mL/min (by C-G formula based on SCr of 0.8 mg/dL). Liver Function Tests: Recent Labs  Lab 03/11/24 2000  AST 18  ALT 14  ALKPHOS  64  BILITOT 0.7  PROT 7.3  ALBUMIN 3.9   No results for input(s): LIPASE, AMYLASE in the last 168 hours. No  results for input(s): AMMONIA in the last 168 hours. Coagulation Profile: Recent Labs  Lab 03/11/24 2335  INR 1.3*   Cardiac Enzymes: No results for input(s): CKTOTAL, CKMB, CKMBINDEX, TROPONINI, TROPONINIHS in the last 168 hours. BNP (last 3 results) Recent Labs    10/12/23 2250  BNP 205.5*   HbA1C: No results for input(s): HGBA1C in the last 72 hours. CBG: No results for input(s): GLUCAP in the last 168 hours. Lipid Profile: No results for input(s): CHOL, HDL, LDLCALC, TRIG, CHOLHDL, LDLDIRECT in the last 72 hours. Thyroid Function Tests: No results for input(s): TSH, T4TOTAL, FREET4, T3FREE, THYROIDAB in the last 72 hours. Anemia Panel: No results for input(s): VITAMINB12, FOLATE, FERRITIN, TIBC, IRON, RETICCTPCT in the last 72 hours. Urine analysis:    Component Value Date/Time   COLORURINE YELLOW (A) 03/11/2024 1938   APPEARANCEUR CLEAR (A) 03/11/2024 1938   APPEARANCEUR Clear 03/19/2013 1617   LABSPEC 1.013 03/11/2024 1938   LABSPEC 1.011 03/19/2013 1617   PHURINE 7.0 03/11/2024 1938   GLUCOSEU NEGATIVE 03/11/2024 1938   GLUCOSEU Negative 03/19/2013 1617   HGBUR NEGATIVE 03/11/2024 1938   BILIRUBINUR NEGATIVE 03/11/2024 1938   BILIRUBINUR Negative 03/19/2013 1617   KETONESUR NEGATIVE 03/11/2024 1938   PROTEINUR NEGATIVE 03/11/2024 1938   NITRITE NEGATIVE 03/11/2024 1938   LEUKOCYTESUR NEGATIVE 03/11/2024 1938   LEUKOCYTESUR Negative 03/19/2013 1617    Radiological Exams on Admission: I have personally reviewed images DG Chest Portable 1 View Result Date: 03/11/2024 CLINICAL DATA:  Fever EXAM: PORTABLE CHEST 1 VIEW COMPARISON:  10/12/2023, chest CT 10/09/2023 FINDINGS: Mild cardiomegaly. Patchy airspace disease at the left base. Mild reticular and ground-glass disease at both bases. Aortic  atherosclerosis. Possible small left effusion. No pneumothorax. CT demonstrated right upper lobe pulmonary nodule not well seen radiographically. IMPRESSION: Mild ground-glass and reticular opacity at the bases with patchy airspace disease at the left base may be due to infection. Emphysema. Electronically Signed   By: Luke Bun M.D.   On: 03/11/2024 20:35    EKG: My personal interpretation of EKG shows: Sinus rhythm without any acute ST changes.  Assessment/Plan Principal Problem:   Acute hypoxic respiratory failure (HCC) Active Problems:   Hyperlipidemia LDL goal <70   S/P PTCA (percutaneous transluminal coronary angioplasty) 02/10/16 to RCA lesion for in stent restenois   Chronic obstructive pulmonary disease (HCC)   Hypothyroidism   Essential hypertension Acute hypoxic respiratory failure secondary to pneumonia.  On chest x-ray the pneumonia appears viral given ground glass opacities at bilateral bases.  Her symptoms also suggest a viral etiology.  She did get multiple antibiotics in the ED so was called from a bacterial pneumonias perspective and I will add on a extended viral pathogen as of that is positive then we should be able to discontinue antibiotics.  I did not order further antibiotics but would continue standard CAP coverage.  Blood cultures ordered, will monitor leukocytosis, and trend fever curve.  Given her COPD will treat for concomitant COPD exacerbation as she has at least 2 out of 3 cardinal symptoms.  Chronic problems Hypertension: Blood pressure is normotensive, so we will hold her home blood pressure medications Hyperlipidemia: Continue home meds COPD: Getting nebulizers and exacerbation treatment with prednisone  and azithromycin . Hypothyroidism: Continue home Synthroid    VTE prophylaxis:  Lovenox   Diet: Heart healthy Code Status:  Full Code Telemetry:  Admission status: Inpatient, Med-Surg Patient is from: Home Anticipated d/c is to: Home Anticipated d/c is  in: 2-3 days   Family Communication: Updated  at bedside  Consults called: None   Severity of Illness: The appropriate patient status for this patient is OBSERVATION. Observation status is judged to be reasonable and necessary in order to provide the required intensity of service to ensure the patient's safety. The patient's presenting symptoms, physical exam findings, and initial radiographic and laboratory data in the context of their medical condition is felt to place them at decreased risk for further clinical deterioration. Furthermore, it is anticipated that the patient will be medically stable for discharge from the hospital within 2 midnights of admission.    Morene Bathe, MD Jolynn DEL. [facility]

## 2024-03-12 ENCOUNTER — Encounter: Payer: Self-pay | Admitting: Internal Medicine

## 2024-03-12 ENCOUNTER — Other Ambulatory Visit: Payer: Self-pay

## 2024-03-12 DIAGNOSIS — J9601 Acute respiratory failure with hypoxia: Secondary | ICD-10-CM

## 2024-03-12 LAB — BASIC METABOLIC PANEL WITH GFR
Anion gap: 7 (ref 5–15)
BUN: 22 mg/dL (ref 8–23)
CO2: 26 mmol/L (ref 22–32)
Calcium: 8.1 mg/dL — ABNORMAL LOW (ref 8.9–10.3)
Chloride: 109 mmol/L (ref 98–111)
Creatinine, Ser: 0.77 mg/dL (ref 0.44–1.00)
GFR, Estimated: >60 mL/min (ref >60)
Glucose, Bld: 148 mg/dL — ABNORMAL HIGH (ref 70–99)
Potassium: 3.6 mmol/L (ref 3.5–5.1)
Sodium: 142 mmol/L (ref 135–145)

## 2024-03-12 LAB — RESPIRATORY PANEL BY PCR

## 2024-03-12 LAB — EXPECTORATED SPUTUM ASSESSMENT W GRAM STAIN, RFLX TO RESP C

## 2024-03-12 LAB — CBC
HCT: 30.6 % — ABNORMAL LOW (ref 36.0–46.0)
Hemoglobin: 9.6 g/dL — ABNORMAL LOW (ref 12.0–15.0)
MCH: 30.1 pg (ref 26.0–34.0)
MCHC: 31.4 g/dL (ref 30.0–36.0)
MCV: 95.9 fL (ref 80.0–100.0)
Platelets: 238 K/uL (ref 150–400)
RBC: 3.19 MIL/uL — ABNORMAL LOW (ref 3.87–5.11)
RDW: 13.7 % (ref 11.5–15.5)
WBC: 14.5 K/uL — ABNORMAL HIGH (ref 4.0–10.5)
nRBC: 0 % (ref 0.0–0.2)

## 2024-03-12 LAB — GLUCOSE, CAPILLARY: Glucose-Capillary: 119 mg/dL — ABNORMAL HIGH (ref 70–99)

## 2024-03-12 MED ORDER — BUDESON-GLYCOPYRROL-FORMOTEROL 160-9-4.8 MCG/ACT IN AERO
2.0000 | INHALATION_SPRAY | Freq: Two times a day (BID) | RESPIRATORY_TRACT | Status: DC
  Administered 2024-03-12 – 2024-03-13 (×3): 2 via RESPIRATORY_TRACT
  Filled 2024-03-12: qty 5.9

## 2024-03-12 MED ORDER — CEFTRIAXONE SODIUM 1 G IJ SOLR
1.0000 g | INTRAMUSCULAR | Status: DC
  Administered 2024-03-12 – 2024-03-13 (×2): 1 g via INTRAVENOUS
  Filled 2024-03-12 (×2): qty 10

## 2024-03-12 MED ORDER — CLOPIDOGREL BISULFATE 75 MG PO TABS
75.0000 mg | ORAL_TABLET | Freq: Every day | ORAL | Status: DC
  Administered 2024-03-12 – 2024-03-13 (×2): 75 mg via ORAL
  Filled 2024-03-12 (×2): qty 1

## 2024-03-12 MED ORDER — BUPRENORPHINE HCL-NALOXONE HCL 8-2 MG SL SUBL
1.0000 | SUBLINGUAL_TABLET | Freq: Every day | SUBLINGUAL | Status: DC
  Administered 2024-03-12 – 2024-03-13 (×2): 1 via SUBLINGUAL
  Filled 2024-03-12 (×2): qty 1

## 2024-03-12 MED ORDER — IPRATROPIUM-ALBUTEROL 0.5-2.5 (3) MG/3ML IN SOLN: 3.0000 mL | Freq: Four times a day (QID) | RESPIRATORY_TRACT | Status: DC | PRN

## 2024-03-12 MED ORDER — LORATADINE 10 MG PO TABS
10.0000 mg | ORAL_TABLET | Freq: Every day | ORAL | Status: DC
  Administered 2024-03-12 – 2024-03-13 (×2): 10 mg via ORAL
  Filled 2024-03-12 (×2): qty 1

## 2024-03-12 MED ORDER — PANTOPRAZOLE SODIUM 40 MG PO TBEC
40.0000 mg | DELAYED_RELEASE_TABLET | Freq: Every day | ORAL | Status: DC
  Administered 2024-03-12 – 2024-03-13 (×2): 40 mg via ORAL
  Filled 2024-03-12 (×2): qty 1

## 2024-03-12 MED ORDER — ASPIRIN 81 MG PO TBEC
81.0000 mg | DELAYED_RELEASE_TABLET | Freq: Every day | ORAL | Status: DC
  Administered 2024-03-12: 81 mg via ORAL
  Filled 2024-03-12: qty 1

## 2024-03-12 MED ORDER — PREDNISONE 20 MG PO TABS
40.0000 mg | ORAL_TABLET | Freq: Every day | ORAL | Status: DC
  Administered 2024-03-12: 40 mg via ORAL
  Filled 2024-03-12: qty 2

## 2024-03-12 MED ORDER — DOXYCYCLINE HYCLATE 100 MG PO TABS
100.0000 mg | ORAL_TABLET | Freq: Two times a day (BID) | ORAL | Status: DC
  Administered 2024-03-12 – 2024-03-13 (×3): 100 mg via ORAL
  Filled 2024-03-12 (×3): qty 1

## 2024-03-12 MED ORDER — ROSUVASTATIN CALCIUM 20 MG PO TABS
20.0000 mg | ORAL_TABLET | Freq: Every day | ORAL | Status: DC
  Administered 2024-03-12 – 2024-03-13 (×2): 20 mg via ORAL
  Filled 2024-03-12 (×2): qty 1

## 2024-03-12 NOTE — Progress Notes (Signed)
 Per Dr Jerelene, dc droplet precautions

## 2024-03-12 NOTE — Plan of Care (Signed)
  Problem: Clinical Measurements: Goal: Ability to maintain clinical measurements within normal limits will improve Outcome: Progressing   Problem: Coping: Goal: Level of anxiety will decrease Outcome: Progressing   Problem: Elimination: Goal: Will not experience complications related to bowel motility Outcome: Progressing   Problem: Pain Managment: Goal: General experience of comfort will improve and/or be controlled Outcome: Progressing   Problem: Safety: Goal: Ability to remain free from injury will improve Outcome: Progressing

## 2024-03-12 NOTE — Plan of Care (Signed)

## 2024-03-12 NOTE — Progress Notes (Signed)
 Patient is from home with her family. Patient being treated for acute resp failure / viral pna and copd exacerbation. No TOC needs noted at this time. Please outreach if needs arise.

## 2024-03-12 NOTE — Progress Notes (Signed)
 PROGRESS NOTE    Kathryn Garrett  FMW:[id number] DOB: 01-21-1946 DOA: 03/11/2024 PCP: Silvano Angeline FALCON, NP  Chief Complaint  Patient presents with   Fever    Family noted pt to be lethargic/lethargic. Just completed a course of antibiotics for UTI and PNA. Pt has no complaints at moment and does not know why she's here in hospital.     Hospital Course:  Kathryn Garrett is a 78 y.o. year old female with past medical history of hypertension, hyperlipidemia, HFpEF (EF 55% in 06/25), tobacco use disorder presenting with report of lethargy by family and fevers, found to have acute hypoxic respiratory failure secondary to pneumonia. Recently completed doxycycline  for pneumonia  Subjective: Patient was examined at bedside, new to me today.  States she feels significantly better, denies any symptoms BP soft, denies any bleeding  Objective: Vitals:   03/12/24 0158 03/12/24 0432 03/12/24 0500 03/12/24 0734  BP:  (!) 95/46    Pulse:  65    Resp:  20    Temp:  98.4 F (36.9 C)    TempSrc:  Oral    SpO2: 93% 98%  97%  Weight:   62.2 kg   Height:        Intake/Output Summary (Last 24 hours) at 03/12/2024 9187 Last data filed at 03/11/2024 2358 Gross per 24 hour  Intake 218 ml  Output --  Net 218 ml   Filed Weights   03/11/24 1946 03/12/24 0500  Weight: 62.3 kg 62.2 kg    Examination: Gen: NAD HENT: NCAT, nasal cannula in place CV: RRR, good pulses Resp: CTAB, no wheeze or rales present Abd: No TTP, normal bowel sounds MSK: No asymmetry, good bulk and tone Skin: No rash on examined skin Neuro: Alert and oriented x 4  Assessment & Plan:   Acute hypoxic respiratory failure secondary to pneumonia - Leukocytosis improving, On 2L [place] - CXR shows pneumonia appears viral given ground glass opacities at bilateral bases, symptoms also suggest a viral etiology but RVP negative - Continue IV Ceftriaxone , doxycycline  - Follow blood cultures. Send respiratory culture  COPD - No  evidence of exacerbation, discontinue steroids - Home inhalers  Acute drop in Hb - Hb 12.2 -> 9.6, baseline Hb ~ 10.5 - No evidence of bleeding. S/p IV fluids - Monitor Hb  Chronic problems Hypertension: Hold amlodipine, Olmesartan , metoprolol  Hyperlipidemia: Continue home meds Hypothyroidism: Continue home Synthroid   -- Ambulatory, at baseline  DVT prophylaxis: Lovenox  SQ   Code Status: Full Code Disposition:  Home  Consultants:  None  Procedures:  None  Antimicrobials:  Anti-infectives (From admission, onward)    Start     Dose/Rate Route Frequency Ordered Stop   03/11/24 2015  ceFEPIme  (MAXIPIME ) 2 g in sodium chloride  0.9 % 100 mL IVPB        2 g 200 mL/hr over 30 Minutes Intravenous  Once 03/11/24 2003 03/11/24 2040   03/11/24 2015  metroNIDAZOLE  (FLAGYL ) IVPB 500 mg        500 mg 100 mL/hr over 60 Minutes Intravenous  Once 03/11/24 2003 03/11/24 2142   03/11/24 2015  vancomycin  (VANCOCIN ) IVPB 1000 mg/200 mL premix        1,000 mg 200 mL/hr over 60 Minutes Intravenous  Once 03/11/24 2003 03/11/24 2318       Data Reviewed: I have personally reviewed following labs and imaging studies CBC: Recent Labs  Lab 03/11/24 2000 03/12/24 0348  WBC 20.3* 14.5*  NEUTROABS 17.2*  --   HGB  12.2 9.6*  HCT 39.4 30.6*  MCV 95.9 95.9  PLT 269 238   Basic Metabolic Panel: Recent Labs  Lab 03/11/24 2000 03/12/24 0348  NA 140 142  K 4.2 3.6  CL 103 109  CO2 24 26  GLUCOSE 94 148*  BUN 22 22  CREATININE 0.80 0.77  CALCIUM  9.2 8.1*  MG 2.1  --    GFR: Estimated Creatinine Clearance: 45.7 mL/min (by C-G formula based on SCr of 0.77 mg/dL). Liver Function Tests: Recent Labs  Lab 03/11/24 2000  AST 18  ALT 14  ALKPHOS 64  BILITOT 0.7  PROT 7.3  ALBUMIN 3.9   CBG: Recent Labs  Lab 03/12/24 0720  GLUCAP 119*    Recent Results (from the past 240 hours)  Blood culture (routine x 2)     Status: None (Preliminary result)   Collection Time: 03/11/24   7:50 PM   Specimen: BLOOD  Result Value Ref Range Status   Specimen Description BLOOD LEFT ASSIST CONTROL  Final   Special Requests   Final    BOTTLES DRAWN AEROBIC AND ANAEROBIC Blood Culture results may not be optimal due to an inadequate volume of blood received in culture bottles   Culture   Final    NO GROWTH < 12 HOURS Performed at [facility], [address] [place], [place] [address]    Report Status PENDING  Incomplete  Blood culture (routine x 2)     Status: None (Preliminary result)   Collection Time: 03/11/24  7:50 PM   Specimen: BLOOD  Result Value Ref Range Status   Specimen Description BLOOD LEFT WRIST  Final   Special Requests   Final    BOTTLES DRAWN AEROBIC AND ANAEROBIC Blood Culture results may not be optimal due to an inadequate volume of blood received in culture bottles   Culture   Final    NO GROWTH < 12 HOURS Performed at [facility], [address] [place], [place] [address]    Report Status PENDING  Incomplete  Resp panel by RT-PCR (RSV, Flu A&B, Covid) Urine, Clean Catch     Status: None   Collection Time: 03/11/24  7:50 PM   Specimen: Urine, Clean Catch; Nasal Swab  Result Value Ref Range Status   SARS Coronavirus 2 by RT PCR NEGATIVE NEGATIVE Final    Comment: (NOTE) SARS-CoV-2 target nucleic acids are NOT DETECTED.  The SARS-CoV-2 RNA is generally detectable in upper respiratory specimens during the acute phase of infection. The lowest concentration of SARS-CoV-2 viral copies this assay can detect is 138 copies/mL. A negative result does not preclude SARS-Cov-2 infection and should not be used as the sole basis for treatment or other patient management decisions. A negative result may occur with  improper specimen collection/handling, submission of specimen other than nasopharyngeal swab, presence of viral mutation(s) within the areas targeted by this assay, and inadequate number of viral copies(<138 copies/mL). A negative  result must be combined with clinical observations, patient history, and epidemiological information. The expected result is Negative.  Fact Sheet for Patients:  bloggercourse.com  Fact Sheet for Healthcare Providers:  seriousbroker.it  This test is no t yet approved or cleared by the [place]  FDA and  has been authorized for detection and/or diagnosis of SARS-CoV-2 by FDA under an Emergency Use Authorization (EUA). This EUA will remain  in effect (meaning this test can be used) for the duration of the COVID-19 declaration under Section 564(b)(1) of the Act, 21 U.S.C.section 360bbb-3(b)(1),  unless the authorization is terminated  or revoked sooner.       Influenza A by PCR NEGATIVE NEGATIVE Final   Influenza B by PCR NEGATIVE NEGATIVE Final    Comment: (NOTE) The Xpert Xpress SARS-CoV-2/FLU/RSV plus assay is intended as an aid in the diagnosis of influenza from Nasopharyngeal swab specimens and should not be used as a sole basis for treatment. Nasal washings and aspirates are unacceptable for Xpert Xpress SARS-CoV-2/FLU/RSV testing.  Fact Sheet for Patients: bloggercourse.com  Fact Sheet for Healthcare Providers: seriousbroker.it  This test is not yet approved or cleared by the [place]  FDA and has been authorized for detection and/or diagnosis of SARS-CoV-2 by FDA under an Emergency Use Authorization (EUA). This EUA will remain in effect (meaning this test can be used) for the duration of the COVID-19 declaration under Section 564(b)(1) of the Act, 21 U.S.C. section 360bbb-3(b)(1), unless the authorization is terminated or revoked.     Resp Syncytial Virus by PCR NEGATIVE NEGATIVE Final    Comment: (NOTE) Fact Sheet for Patients: bloggercourse.com  Fact Sheet for Healthcare Providers: seriousbroker.it  This  test is not yet approved or cleared by the [place]  FDA and has been authorized for detection and/or diagnosis of SARS-CoV-2 by FDA under an Emergency Use Authorization (EUA). This EUA will remain in effect (meaning this test can be used) for the duration of the COVID-19 declaration under Section 564(b)(1) of the Act, 21 U.S.C. section 360bbb-3(b)(1), unless the authorization is terminated or revoked.  Performed at [facility], [address] [place], [place] [address]   Respiratory (~20 pathogens) panel by PCR     Status: None   Collection Time: 03/11/24 11:56 PM   Specimen: Nasopharyngeal Swab; Respiratory  Result Value Ref Range Status   Adenovirus NOT DETECTED NOT DETECTED Final   Coronavirus 229E NOT DETECTED NOT DETECTED Final    Comment: (NOTE) The Coronavirus on the Respiratory Panel, DOES NOT test for the novel  Coronavirus (2019 nCoV)    Coronavirus HKU1 NOT DETECTED NOT DETECTED Final   Coronavirus NL63 NOT DETECTED NOT DETECTED Final   Coronavirus OC43 NOT DETECTED NOT DETECTED Final   Metapneumovirus NOT DETECTED NOT DETECTED Final   Rhinovirus / Enterovirus NOT DETECTED NOT DETECTED Final   Influenza A NOT DETECTED NOT DETECTED Final   Influenza B NOT DETECTED NOT DETECTED Final   Parainfluenza Virus 1 NOT DETECTED NOT DETECTED Final   Parainfluenza Virus 2 NOT DETECTED NOT DETECTED Final   Parainfluenza Virus 3 NOT DETECTED NOT DETECTED Final   Parainfluenza Virus 4 NOT DETECTED NOT DETECTED Final   Respiratory Syncytial Virus NOT DETECTED NOT DETECTED Final   Bordetella pertussis NOT DETECTED NOT DETECTED Final   Bordetella Parapertussis NOT DETECTED NOT DETECTED Final   Chlamydophila pneumoniae NOT DETECTED NOT DETECTED Final   Mycoplasma pneumoniae NOT DETECTED NOT DETECTED Final    Comment: Performed at [facility], [address], [place], [place] [address]     Radiology Studies: DG Chest Portable 1 View Result Date:  03/11/2024 CLINICAL DATA:  Fever EXAM: PORTABLE CHEST 1 VIEW COMPARISON:  10/12/2023, chest CT 10/09/2023 FINDINGS: Mild cardiomegaly. Patchy airspace disease at the left base. Mild reticular and ground-glass disease at both bases. Aortic atherosclerosis. Possible small left effusion. No pneumothorax. CT demonstrated right upper lobe pulmonary nodule not well seen radiographically. IMPRESSION: Mild ground-glass and reticular opacity at the bases with patchy airspace disease at the left base may be due to infection. Emphysema. Electronically Signed  By: Luke Bun M.D.   On: 03/11/2024 20:35    Scheduled Meds:  aspirin  EC  81 mg Oral QHS   clopidogrel   75 mg Oral Daily   enoxaparin  (LOVENOX ) injection  40 mg Subcutaneous Q24H   ipratropium-albuterol   3 mL Nebulization Q6H   loratadine   10 mg Oral Daily   pantoprazole   40 mg Oral Daily   predniSONE   40 mg Oral Q breakfast   rosuvastatin   20 mg Oral Daily   sodium chloride  flush  3 mL Intravenous Q12H   Continuous Infusions:   LOS: 1 day  MDM: Patient is high risk for one or more organ failure.  They necessitate ongoing hospitalization for continued IV therapies and subsequent lab monitoring. Total time spent interpreting labs and vitals, reviewing the medical record, coordinating care amongst consultants and care team members, directly assessing and discussing care with the patient and/or family: 55 min Laree Lock, MD [facility]  To contact the attending physician between 7A-7P please use Epic Chat. To contact the covering physician during after hours 7P-7A, please review Amion.  03/12/2024, 8:12 AM   *This document has been created with the assistance of dictation software. Please excuse typographical errors. *

## 2024-03-13 DIAGNOSIS — J9601 Acute respiratory failure with hypoxia: Secondary | ICD-10-CM | POA: Diagnosis not present

## 2024-03-13 LAB — BASIC METABOLIC PANEL WITH GFR
Anion gap: 8 (ref 5–15)
BUN: 20 mg/dL (ref 8–23)
CO2: 24 mmol/L (ref 22–32)
Calcium: 8.7 mg/dL — ABNORMAL LOW (ref 8.9–10.3)
Chloride: 108 mmol/L (ref 98–111)
Creatinine, Ser: 0.87 mg/dL (ref 0.44–1.00)
GFR, Estimated: >60 mL/min (ref >60)
Glucose, Bld: 126 mg/dL — ABNORMAL HIGH (ref 70–99)
Potassium: 4.9 mmol/L (ref 3.5–5.1)
Sodium: 140 mmol/L (ref 135–145)

## 2024-03-13 LAB — CBC
HCT: 32.3 % — ABNORMAL LOW (ref 36.0–46.0)
Hemoglobin: 10 g/dL — ABNORMAL LOW (ref 12.0–15.0)
MCH: 30.2 pg (ref 26.0–34.0)
MCHC: 31 g/dL (ref 30.0–36.0)
MCV: 97.6 fL (ref 80.0–100.0)
Platelets: 252 K/uL (ref 150–400)
RBC: 3.31 MIL/uL — ABNORMAL LOW (ref 3.87–5.11)
RDW: 13.6 % (ref 11.5–15.5)
WBC: 16.7 K/uL — ABNORMAL HIGH (ref 4.0–10.5)
nRBC: 0 % (ref 0.0–0.2)

## 2024-03-13 LAB — GLUCOSE, CAPILLARY: Glucose-Capillary: 107 mg/dL — ABNORMAL HIGH (ref 70–99)

## 2024-03-13 MED ORDER — METOPROLOL SUCCINATE ER 50 MG PO TB24
50.0000 mg | ORAL_TABLET | Freq: Every day | ORAL | Status: DC
  Administered 2024-03-13: 50 mg via ORAL
  Filled 2024-03-13: qty 1

## 2024-03-13 MED ORDER — DOXYCYCLINE HYCLATE 100 MG PO TABS: 100.0000 mg | ORAL_TABLET | Freq: Two times a day (BID) | ORAL | 0 refills | Status: AC

## 2024-03-13 MED ORDER — CEFUROXIME AXETIL 500 MG PO TABS: 500.0000 mg | ORAL_TABLET | Freq: Two times a day (BID) | ORAL | 0 refills | Status: AC

## 2024-03-13 NOTE — Plan of Care (Signed)

## 2024-03-13 NOTE — Discharge Summary (Signed)
 Physician Discharge Summary   Patient: Kathryn Garrett MRN: [id number] DOB: May 19, 1945  Admit date:     03/11/2024  Discharge date: 03/13/24  Discharge Physician: Laree Lock   PCP: Silvano Angeline FALCON, NP   Recommendations at discharge:   Follow-up with PCP within 1 week -repeat CBC (leukocytosis) Monitor BP -Meds held, resume amlodipine, olmesartan  as tolerated  Discharge Diagnoses: Principal Problem:   Acute hypoxic respiratory failure (HCC) Active Problems:   Hyperlipidemia LDL goal <70   S/P PTCA (percutaneous transluminal coronary angioplasty) 02/10/16 to RCA lesion for in stent restenois   Chronic obstructive pulmonary disease (HCC)   Hypothyroidism   Essential hypertension   Hospital Course: Kathryn Garrett is a 78 y.o. year old female with past medical history of hypertension, hyperlipidemia, HFpEF (EF 55% in 06/25), tobacco use disorder presenting with report of lethargy by family and fevers, found to have acute hypoxic respiratory failure secondary to pneumonia. Recently completed doxycycline  for pneumonia  Assessment and Plan: Acute hypoxic respiratory failure - resolved secondary to pneumonia - Leukocytosis (was on prednisone ), On 2L  - weaned to room air - CXR shows pneumonia appears viral given ground glass opacities at bilateral bases, symptoms also suggest a viral etiology but RVP negative - Bcx NGTD - was on IV Ceftriaxone , doxycycline , discharge on po Cefuroxime, doxy to complete 5 days   COPD - No evidence of exacerbation, discontinue steroids - Home inhalers   Acute drop in Hb - Hb 12.2 -> 9.6 -> 10 (stable), baseline Hb ~ 10.5 - No evidence of bleeding. S/p IV fluids  Hypertension: Hold amlodipine, Olmesartan  (resume as BP tolerates), on metoprolol  Hyperlipidemia: Continue home meds Hypothyroidism: Continue home Synthroid    -- Ambulatory, at baseline (no PT needs)  Consultants: None Procedures performed: None  Disposition: Home Diet  recommendation:  Discharge Diet Orders (From admission, onward)     Start     Ordered   03/13/24 0000  Diet - low sodium heart healthy        03/13/24 1253           DISCHARGE MEDICATION: Allergies as of 03/13/2024       Reactions   Pregabalin Other (See Comments)   'Makes me extremely sedated'   Azithromycin  Rash   Doxycycline  Nausea Only        Medication List     PAUSE taking these medications    amLODipine 2.5 MG tablet Wait to take this until your doctor or other care provider tells you to start again. Commonly known as: NORVASC Take 2.5 mg by mouth daily.   olmesartan  40 MG tablet Wait to take this until your doctor or other care provider tells you to start again. Commonly known as: BENICAR  Take 1 tablet (40 mg total) by mouth daily.       STOP taking these medications    losartan  100 MG tablet Commonly known as: COZAAR        TAKE these medications    acetaminophen  500 MG tablet Commonly known as: TYLENOL  Take 1,000 mg by mouth every 6 (six) hours as needed for headache.   albuterol  108 (90 Base) MCG/ACT inhaler Commonly known as: VENTOLIN  HFA Inhale 2 puffs into the lungs every 4 (four) hours as needed for wheezing or shortness of breath.   aspirin  EC 81 MG tablet Take 81 mg by mouth at bedtime.   Buprenorphine  HCl-Naloxone  HCl 8-2 MG Film Place 1 Film under the tongue daily.   cefUROXime 500 MG tablet Commonly known as: CEFTIN  Take 1 tablet (500 mg total) by mouth 2 (two) times daily with a meal for 2 days. Start taking on: March 14, 2024   clopidogrel  75 MG tablet Commonly known as: PLAVIX  Take 1 tablet (75 mg total) by mouth daily.   doxycycline  100 MG tablet Commonly known as: VIBRA -TABS Take 1 tablet (100 mg total) by mouth every 12 (twelve) hours for 2 days.   estradiol 0.1 MG/GM vaginal cream Commonly known as: ESTRACE Place 1 Applicatorful vaginally.   furosemide  20 MG tablet Commonly known as: LASIX  Take 1 tablet  (20 mg total) by mouth daily. MAY TAKE AN EXTRA 20 MG AS NEEDED FOR 2 LB WEIGHT GAIN IN 24 OR 5 LB WEIGHT GAIN IN 5 DAYS   meloxicam 7.5 MG tablet Commonly known as: MOBIC Take 7.5 mg by mouth daily.   metoprolol  succinate 50 MG 24 hr tablet Commonly known as: TOPROL -XL Take 1 tablet (50 mg total) by mouth daily. Take with or immediately following a meal.   Mounjaro 5 MG/0.5ML Pen Generic drug: tirzepatide Inject 5 mg into the skin once a week.   nitroGLYCERIN  0.4 MG SL tablet Commonly known as: NITROSTAT  Place 1 tablet (0.4 mg total) under the tongue every 5 (five) minutes as needed for chest pain.   pantoprazole  40 MG tablet Commonly known as: PROTONIX  Take 40 mg by mouth daily.   rosuvastatin  20 MG tablet Commonly known as: CRESTOR  Take 1 tablet (20 mg total) by mouth daily.   Trelegy Ellipta 100-62.5-25 MCG/INH Aepb Generic drug: Fluticasone -Umeclidin-Vilant Take 1 puff by mouth daily.        Follow-up Information     Silvano Angeline FALCON, NP Follow up.   Why: hospital follow up Contact information: [address] [place] [address] [phone]                Discharge Exam: Fredricka Weights   03/11/24 1946 03/12/24 0500 03/13/24 0500  Weight: 62.3 kg 62.2 kg 63 kg    Gen: NAD CV: RRR, good pulses Resp: CTAB, no wheeze or rales present Abd: No TTP, normal bowel sounds MSK: No asymmetry, good bulk and tone Skin: No rash on examined skin Neuro: Alert and oriented x 4  Condition at discharge: good  The results of significant diagnostics from this hospitalization (including imaging, microbiology, ancillary and laboratory) are listed below for reference.   Imaging Studies: DG Chest Portable 1 View Result Date: 03/11/2024 CLINICAL DATA:  Fever EXAM: PORTABLE CHEST 1 VIEW COMPARISON:  10/12/2023, chest CT 10/09/2023 FINDINGS: Mild cardiomegaly. Patchy airspace disease at the left base. Mild reticular and ground-glass disease at both bases. Aortic  atherosclerosis. Possible small left effusion. No pneumothorax. CT demonstrated right upper lobe pulmonary nodule not well seen radiographically. IMPRESSION: Mild ground-glass and reticular opacity at the bases with patchy airspace disease at the left base may be due to infection. Emphysema. Electronically Signed   By: Luke Bun M.D.   On: 03/11/2024 20:35    Microbiology: Results for orders placed or performed during the hospital encounter of 03/11/24  Blood culture (routine x 2)     Status: None (Preliminary result)   Collection Time: 03/11/24  7:50 PM   Specimen: BLOOD  Result Value Ref Range Status   Specimen Description BLOOD LEFT ASSIST CONTROL  Final   Special Requests   Final    BOTTLES DRAWN AEROBIC AND ANAEROBIC Blood Culture results may not be optimal due to an inadequate volume of blood received in culture bottles  Culture   Final    NO GROWTH 2 DAYS Performed at [facility], [address] [place], [place] [address]    Report Status PENDING  Incomplete  Blood culture (routine x 2)     Status: None (Preliminary result)   Collection Time: 03/11/24  7:50 PM   Specimen: BLOOD  Result Value Ref Range Status   Specimen Description BLOOD LEFT WRIST  Final   Special Requests   Final    BOTTLES DRAWN AEROBIC AND ANAEROBIC Blood Culture results may not be optimal due to an inadequate volume of blood received in culture bottles   Culture   Final    NO GROWTH 2 DAYS Performed at [facility], [address], [place], [place] [address]    Report Status PENDING  Incomplete  Resp panel by RT-PCR (RSV, Flu A&B, Covid) Urine, Clean Catch     Status: None   Collection Time: 03/11/24  7:50 PM   Specimen: Urine, Clean Catch; Nasal Swab  Result Value Ref Range Status   SARS Coronavirus 2 by RT PCR NEGATIVE NEGATIVE Final    Comment: (NOTE) SARS-CoV-2 target nucleic acids are NOT DETECTED.  The SARS-CoV-2 RNA is generally detectable in upper  respiratory specimens during the acute phase of infection. The lowest concentration of SARS-CoV-2 viral copies this assay can detect is 138 copies/mL. A negative result does not preclude SARS-Cov-2 infection and should not be used as the sole basis for treatment or other patient management decisions. A negative result may occur with  improper specimen collection/handling, submission of specimen other than nasopharyngeal swab, presence of viral mutation(s) within the areas targeted by this assay, and inadequate number of viral copies(<138 copies/mL). A negative result must be combined with clinical observations, patient history, and epidemiological information. The expected result is Negative.  Fact Sheet for Patients:  bloggercourse.com  Fact Sheet for Healthcare Providers:  seriousbroker.it  This test is no t yet approved or cleared by the [place]  FDA and  has been authorized for detection and/or diagnosis of SARS-CoV-2 by FDA under an Emergency Use Authorization (EUA). This EUA will remain  in effect (meaning this test can be used) for the duration of the COVID-19 declaration under Section 564(b)(1) of the Act, 21 U.S.C.section 360bbb-3(b)(1), unless the authorization is terminated  or revoked sooner.       Influenza A by PCR NEGATIVE NEGATIVE Final   Influenza B by PCR NEGATIVE NEGATIVE Final    Comment: (NOTE) The Xpert Xpress SARS-CoV-2/FLU/RSV plus assay is intended as an aid in the diagnosis of influenza from Nasopharyngeal swab specimens and should not be used as a sole basis for treatment. Nasal washings and aspirates are unacceptable for Xpert Xpress SARS-CoV-2/FLU/RSV testing.  Fact Sheet for Patients: bloggercourse.com  Fact Sheet for Healthcare Providers: seriousbroker.it  This test is not yet approved or cleared by the [place]  FDA and has been  authorized for detection and/or diagnosis of SARS-CoV-2 by FDA under an Emergency Use Authorization (EUA). This EUA will remain in effect (meaning this test can be used) for the duration of the COVID-19 declaration under Section 564(b)(1) of the Act, 21 U.S.C. section 360bbb-3(b)(1), unless the authorization is terminated or revoked.     Resp Syncytial Virus by PCR NEGATIVE NEGATIVE Final    Comment: (NOTE) Fact Sheet for Patients: bloggercourse.com  Fact Sheet for Healthcare Providers: seriousbroker.it  This test is not yet approved or cleared by the [place]  FDA and has been authorized for detection and/or diagnosis  of SARS-CoV-2 by FDA under an Emergency Use Authorization (EUA). This EUA will remain in effect (meaning this test can be used) for the duration of the COVID-19 declaration under Section 564(b)(1) of the Act, 21 U.S.C. section 360bbb-3(b)(1), unless the authorization is terminated or revoked.  Performed at [facility], [address] [place], [place] [address]   Respiratory (~20 pathogens) panel by PCR     Status: None   Collection Time: 03/11/24 11:56 PM   Specimen: Nasopharyngeal Swab; Respiratory  Result Value Ref Range Status   Adenovirus NOT DETECTED NOT DETECTED Final   Coronavirus 229E NOT DETECTED NOT DETECTED Final    Comment: (NOTE) The Coronavirus on the Respiratory Panel, DOES NOT test for the novel  Coronavirus (2019 nCoV)    Coronavirus HKU1 NOT DETECTED NOT DETECTED Final   Coronavirus NL63 NOT DETECTED NOT DETECTED Final   Coronavirus OC43 NOT DETECTED NOT DETECTED Final   Metapneumovirus NOT DETECTED NOT DETECTED Final   Rhinovirus / Enterovirus NOT DETECTED NOT DETECTED Final   Influenza A NOT DETECTED NOT DETECTED Final   Influenza B NOT DETECTED NOT DETECTED Final   Parainfluenza Virus 1 NOT DETECTED NOT DETECTED Final   Parainfluenza Virus 2 NOT DETECTED NOT DETECTED Final    Parainfluenza Virus 3 NOT DETECTED NOT DETECTED Final   Parainfluenza Virus 4 NOT DETECTED NOT DETECTED Final   Respiratory Syncytial Virus NOT DETECTED NOT DETECTED Final   Bordetella pertussis NOT DETECTED NOT DETECTED Final   Bordetella Parapertussis NOT DETECTED NOT DETECTED Final   Chlamydophila pneumoniae NOT DETECTED NOT DETECTED Final   Mycoplasma pneumoniae NOT DETECTED NOT DETECTED Final    Comment: Performed at [facility], [address], [place], [place] [address]  Expectorated Sputum Assessment w Gram Stain, Rflx to Resp Cult     Status: None   Collection Time: 03/12/24  1:16 PM   Specimen: SPU  Result Value Ref Range Status   Specimen Description SPUTUM  Final   Special Requests NONE  Final   Sputum evaluation   Final    Sputum specimen not acceptable for testing.  Please recollect.   NOTIFIED IZETTA GLATTER, RN [phone] 03/12/24 GM Performed at [facility], [address] [place], [place] [address]    Report Status 03/12/2024 FINAL  Final    Labs: CBC: Recent Labs  Lab 03/11/24 2000 03/12/24 0348 03/13/24 0333  WBC 20.3* 14.5* 16.7*  NEUTROABS 17.2*  --   --   HGB 12.2 9.6* 10.0*  HCT 39.4 30.6* 32.3*  MCV 95.9 95.9 97.6  PLT 269 238 252   Basic Metabolic Panel: Recent Labs  Lab 03/11/24 2000 03/12/24 0348 03/13/24 0333  NA 140 142 140  K 4.2 3.6 4.9  CL 103 109 108  CO2 24 26 24   GLUCOSE 94 148* 126*  BUN 22 22 20   CREATININE 0.80 0.77 0.87  CALCIUM  9.2 8.1* 8.7*  MG 2.1  --   --    Liver Function Tests: Recent Labs  Lab 03/11/24 2000  AST 18  ALT 14  ALKPHOS 64  BILITOT 0.7  PROT 7.3  ALBUMIN 3.9   CBG: Recent Labs  Lab 03/12/24 0720 03/13/24 0725  GLUCAP 119* 107*    Discharge time spent: greater than 30 minutes.  Signed: Laree Lock, MD [facility] 03/13/2024

## 2024-03-16 LAB — CULTURE, BLOOD (ROUTINE X 2)
Culture: NO GROWTH
Culture: NO GROWTH

## 2024-03-18 ENCOUNTER — Ambulatory Visit: Admitting: Physical Therapy

## 2024-03-25 ENCOUNTER — Ambulatory Visit: Admitting: Physical Therapy

## 2024-03-26 ENCOUNTER — Ambulatory Visit

## 2024-03-26 ENCOUNTER — Other Ambulatory Visit: Payer: Self-pay

## 2024-03-26 DIAGNOSIS — R21 Rash and other nonspecific skin eruption: Secondary | ICD-10-CM

## 2024-03-26 MED ORDER — TRIAMCINOLONE ACETONIDE 0.1 % EX OINT: TOPICAL_OINTMENT | CUTANEOUS | 2 refills | Status: AC

## 2024-03-26 NOTE — Patient Instructions (Signed)

## 2024-03-26 NOTE — Progress Notes (Signed)
    Subjective   Kathryn Garrett is a 78 y.o. female who presents for the following: Rash. Patient is new patient  Today patient reports: Rash on the abdomen and bilateral thighs. Patient states this typically presents when she is sick. Rash presented this morning and feel like a sunburn patient states that she thinks she has a UTI and started an antibiotic for that this morning. She was recently hospitalized for pneumonia and completed a course of antibiotics. Patient denied all environmental, medication, dietary changes.  Denies any new medications other than what she received in the hospital. Denies any eye pain, mouth pain, urinary pain. Denies face feeling red or swollen. Denies swollen lymph nodes. No fevers, feels otherwise well.   Review of Systems:    No other skin or systemic complaints except as noted in HPI or Assessment and Plan.  The following portions of the chart were reviewed this encounter and updated as appropriate: medications, allergies, medical history  Relevant Medical History:  n/a   Objective  (SKPE) Well appearing patient in no apparent distress; mood and affect are within normal limits. Examination was performed of the: Waist Up Skin Exam: scalp, head, eyes, ears, nose, lips, neck, chest, axillae, upper extremities, abdomen, back, hands, fingers, fingernails  Bilateral lower extremities Examination notable for: erythematous patches with fine scale on abdomen, back, proximal lower extremities. Exacerbated at intertriginous sites.  Eyes, oral mucosa clear without ulceration No lymphadenopathy  Examination limited by: Clothing and Patient deferred removal       Assessment & Plan  (SKAP)   Diffuse erythematous eruption - favor morbilliform drug vs SDRIFE vs viral exanthem  - Recently admitted from 10/28-10/30 for pneumonia, drug history as per below. Suspect secondary to antibiotic use (Cephalosporins).  - Discussed this rash typically self-resolves 1 to 2  weeks after discontinuation of the offending medication. Should they require this medication in the future, is it reasonable to consider treating through, as the rash typically self resolves after a few weeks.  - There is no mucosal involvement, making SJS/TEN unlikely. No features of DRESS  start triamcinolone ointment 0.1% twice daily to affected areas of skin Discussed side effect of potent topical steroids including atrophy, dyspigmentation, striae, telangectasia, folliculitis, loss of skin pigment, hair growth, tachyphylaxis, risk of systemic absorption with missuse. Will check CBC/Diff and CMP Will recheck next week. Discussed red flag signs/reasons to go to ED including new fevers, eye/mouth irritation   Medication review:  10/15:doxycycline   10/28: cefepime , metronidazole , vancomycin  10/29: ceftriaxone , doxycycline   10/30: discharged on cefuroxime, doxycycline   11/4: nitrofurantoin from PCP 11/12: rash onset   Level of service outlined above   Patient instructions (SKPI)   Procedures, orders, diagnosis for this visit:    There are no diagnoses linked to this encounter.  Return to clinic: No follow-ups on file.  I, Emerick Ege, CMA am acting as scribe for Lauraine JAYSON Kanaris, MD.   Documentation: I have reviewed the above documentation for accuracy and completeness, and I agree with the above.  Lauraine JAYSON Kanaris, MD

## 2024-03-27 LAB — CBC WITH DIFFERENTIAL/PLATELET
Basophils Absolute: 0.1 x10E3/uL (ref 0.0–0.2)
Basos: 1 %
EOS (ABSOLUTE): 0.2 x10E3/uL (ref 0.0–0.4)
Eos: 2 %
Hematocrit: 31.9 % — ABNORMAL LOW (ref 34.0–46.6)
Hemoglobin: 10.3 g/dL — ABNORMAL LOW (ref 11.1–15.9)
Immature Grans (Abs): 0.1 x10E3/uL (ref 0.0–0.1)
Immature Granulocytes: 1 %
Lymphocytes Absolute: 1.7 x10E3/uL (ref 0.7–3.1)
Lymphs: 18 %
MCH: 30.5 pg (ref 26.6–33.0)
MCHC: 32.3 g/dL (ref 31.5–35.7)
MCV: 94 fL (ref 79–97)
Monocytes Absolute: 0.8 x10E3/uL (ref 0.1–0.9)
Monocytes: 8 %
Neutrophils Absolute: 6.5 x10E3/uL (ref 1.4–7.0)
Neutrophils: 70 %
Platelets: 306 x10E3/uL (ref 150–450)
RBC: 3.38 x10E6/uL — ABNORMAL LOW (ref 3.77–5.28)
RDW: 12.8 % (ref 11.7–15.4)
WBC: 9.3 x10E3/uL (ref 3.4–10.8)

## 2024-03-27 LAB — COMPREHENSIVE METABOLIC PANEL WITH GFR
ALT: 11 IU/L (ref 0–32)
AST: 13 IU/L (ref 0–40)
Albumin: 4 g/dL (ref 3.8–4.8)
Alkaline Phosphatase: 77 IU/L (ref 49–135)
BUN/Creatinine Ratio: 27 (ref 12–28)
BUN: 27 mg/dL (ref 8–27)
Bilirubin Total: 0.2 mg/dL (ref 0.0–1.2)
CO2: 24 mmol/L (ref 20–29)
Calcium: 8.9 mg/dL (ref 8.7–10.3)
Chloride: 103 mmol/L (ref 96–106)
Creatinine, Ser: 1.01 mg/dL — ABNORMAL HIGH (ref 0.57–1.00)
Globulin, Total: 1.9 g/dL (ref 1.5–4.5)
Glucose: 91 mg/dL (ref 70–99)
Potassium: 4.4 mmol/L (ref 3.5–5.2)
Sodium: 140 mmol/L (ref 134–144)
Total Protein: 5.9 g/dL — ABNORMAL LOW (ref 6.0–8.5)
eGFR: 57 mL/min/1.73 — ABNORMAL LOW (ref >59)

## 2024-03-31 ENCOUNTER — Ambulatory Visit: Payer: Self-pay

## 2024-03-31 ENCOUNTER — Ambulatory Visit: Admitting: Physical Therapy

## 2024-03-31 NOTE — Telephone Encounter (Signed)
 Patient informed of pathology results

## 2024-03-31 NOTE — Telephone Encounter (Signed)
-----   Message from Lauraine JAYSON Kanaris sent at 03/31/2024  8:53 AM EST ----- Please notify patient with below plan: Labs within acceptable limits  ----- Message ----- From: Rebecka Memos Lab Results In Sent: 03/27/2024   9:27 AM EST To: Lauraine JAYSON Kanaris, MD

## 2024-03-31 NOTE — Progress Notes (Unsigned)
 Cardiology Office Note:    Date:  04/01/2024   ID:  Kathryn Garrett, DOB [birth year], MRN [id number]  PCP:  Silvano Angeline FALCON, NP   [facility] Providers Cardiologist:  None Cardiology APP:  Vannie Reche RAMAN, NP { Click to update primary MD,subspecialty MD or APP then REFRESH:1}    Referring MD: Silvano Angeline FALCON, NP   Chief complaint: Follow up hospital admission     History of Present Illness:   Kathryn Garrett is a 78 y.o. female with a hx of CAD s/p multiple PCIs, hypertension, hyperlipidemia, COPD, HFrEF/ICM, presenting to office for follow-up of chronic cardiovascular conditions.  2010 sustained an inferior MI treated with BMS to RCA. 2012 had DES to proximal/mid RCA for ISR of BMS. 2017 admitted for unstable angina, cardiac cath showing ISR of RCA treated with POBA. 2018 unstable angina with cardiac cath showing ISR treated with PTCA with scoring balloon. She had bruit on exam with subsequent carotid doppler showing bilateral ECA stenosis >50% but no significant ICA stenosis.  08/2020 admitted with acute hypoxic respiratory failure secondary to COPD exacerbation, complicated by flash pulmonary edema and NSTEMI.  Echo showing LVEF 25-30%, indeterminate diastolic parameters, mild MR. 08/7975 R/LHC showing mild to moderate nonobstructive CAD including 20-35% distal LMCA and 50% ostial LAD disease with FFR not hemodynamicaly significant, prox RCA stent with mild-moderat ICR (30-40%), normal pulmonary pressure, cardiac output/index, filling pressures.  Had SVT while admitted and was discharged on Toprol . 11/2020 repeat echo showing LVEF 60-65%, no RWMA, mild LVH, G1 DD, trivial MR. 01/2022 admitted for acute heart failure, diuresed with IV lasix .  02/2022 c/o palpitations, ZIO monitor NSR w/ 7 short runs of SVT, no patient trigger. 06/2022 admitted to [facility] for CHF, troponin 229/BNP 354, discharged with Lasix  40 mg.  Last seen 01/28/2024 by Reche Vannie, NP, was  doing well at that time aside from significant lower back pain that was worse with movement.  It was noted that per hematology the plan was to monitor Hgb 1-2 times per year and if persistently reduced, rereferred to hematology.  Reported elevated BPs, losartan  changed to olmesartan .  Admitted 03/11/2024-03/14/2024 for acute hypoxic respiratory failure secondary to pneumonia.  Acute drop in Hgb observed s/p IV fluids (12.2>> 9.6>> baseline 10.5).  Cultures negative for sepsis.  Received IV antibiotics, given p.o. antibiotics to take at home. BP meds held, instructed to resume amlodipine/olmesartan  as tolerated at discharge.  Presents independently, appears stable from a cardiovascular standpoint. She denies chest pain, palpitations, dyspnea, orthopnea, n, v, dark/tarry/bloody stools, hematuria, dizziness, syncope, edema, weight gain.  Reports occasional left arm pain/aching that has been intermittent over the last 6 months.  Occurs on average around once a month, lasts around 30 minutes, resolved spontaneously, no aggravating or alleviating factors, no radiation, no associated symptoms, nonexertional.  Reports she is continuing to cough following her pneumonia, reports it is unchanged, denies fevers.  Overall she reports she is feeling better since her hospitalization, has completed all courses of antibiotics.  Has follow-up with her PCP scheduled for 2 weeks from now.  Works as a interior and spatial designer.   ROS:   Please see the history of present illness.    All other systems reviewed and are negative.     Past Medical History:  Diagnosis Date   Anemia, mild    Arthritis    qwhere (02/10/2016)   CAD (coronary artery disease)    a. VF arrest 01/2009/CAD with inferoposterior MI s/p aspiration thrombectomy/BMS of RCA at  that time. b. ISR of BMS s/p DES to RCA 06/2010 with moderate LM/LAD disease not significant by FFR. c. s/p angiosculpt PTCA to prox RCA 01/2016 for ISR. d. Aggressive PTCA to prox RCA for ISR  05/2016.   Cardiac arrest (HCC) 01/2009   a. in setting of inf-post STEMI 01/2009 (VF).   Chest pain 06/04/2016   Chronic bronchitis (HCC)    Chronic combined systolic and diastolic CHF (congestive heart failure) (HCC)    a. EF 40-45% by cath 2010. b. 60-65% with grade 1 DD by echo 09/2015   COPD (chronic obstructive pulmonary disease) (HCC)    Hyperlipidemia 01/28/2016   Hyperlipidemia LDL goal <70 01/28/2016   Hypertension    Lung mass    MI (myocardial infarction) (HCC) 01/2009   Narcotic abuse (HCC)    pt now taking Suboxone  tid   Pleural effusion on left 09/24/2015   Pneumonia 06/2014; 09/2015   Pre-diabetes    Pulmonary nodule    PVD (peripheral vascular disease)    mild atherosclerosis of infrarenal aorta, 25% ostial left renal artery stenosis, 50% ostial right common iliac by cath 2010   S/P PTCA (percutaneous transluminal coronary angioplasty) 02/10/16 to RCA lesion for in stent restenois 02/11/2016   Subclavian artery stenosis    a. >50% by duplex 05/2016.   Tobacco abuse    Unstable angina [facility])     Past Surgical History:  Procedure Laterality Date   ABDOMINAL HYSTERECTOMY     ANKLE FRACTURE SURGERY Right 2000s   CARDIAC CATHETERIZATION N/A 02/10/2016   Procedure: Left Heart Cath and Coronary Angiography;  Surgeon: Candyce GORMAN Reek, MD;  Location: [facility] INVASIVE CV LAB;  Service: Cardiovascular;  Laterality: N/A;   CARDIAC CATHETERIZATION N/A 02/10/2016   Procedure: Coronary Balloon Angioplasty;  Surgeon: Candyce GORMAN Reek, MD;  Location: [facility] INVASIVE CV LAB;  Service: Cardiovascular;  Laterality: N/A;  instent RCA   CARDIAC CATHETERIZATION N/A 06/05/2016   Procedure: Left Heart Cath and Coronary Angiography;  Surgeon: Alm LELON Clay, MD;  Location: [facility] INVASIVE CV LAB;  Service: Cardiovascular;  Laterality: N/A;   CARDIAC CATHETERIZATION N/A 06/05/2016   Procedure: Coronary Balloon Angioplasty;  Surgeon: Alm LELON Clay, MD;  Location: [facility] INVASIVE CV LAB;  Service: Cardiovascular;   Laterality: N/A;   CHEST TUBE INSERTION N/A 10/08/2015   Procedure: PLEURX CATH REMOVAL;  Surgeon: Evalene Glasser, MD;  Location: [facility] ORS;  Service: Thoracic;  Laterality: N/A;   CORONARY ANGIOPLASTY WITH STENT PLACEMENT  01/2009; 2012   FRACTURE SURGERY     RIGHT/LEFT HEART CATH AND CORONARY ANGIOGRAPHY N/A 09/24/2020   Procedure: RIGHT/LEFT HEART CATH AND CORONARY ANGIOGRAPHY;  Surgeon: Mady Bruckner, MD;  Location: [facility] INVASIVE CV LAB;  Service: Cardiovascular;  Laterality: N/A;   VIDEO ASSISTED THORACOSCOPY (VATS)/THOROCOTOMY Left 09/29/2015   Procedure: PREOP BRONCHOSCOPY, LEFT THORACOSCOPY, POSSIBLE THORACOTOMY, PLEURAL BIOPSY, TALC ;  Surgeon: Evalene Glasser, MD;  Location: [facility] ORS;  Service: General;  Laterality: Left;    Current Medications: Current Meds  Medication Sig   acetaminophen  (TYLENOL ) 500 MG tablet Take 1,000 mg by mouth every 6 (six) hours as needed for headache.   aspirin  EC 81 MG tablet Take 81 mg by mouth at bedtime.    Buprenorphine  HCl-Naloxone  HCl 8-2 MG FILM Place 1 Film under the tongue daily.   clopidogrel  (PLAVIX ) 75 MG tablet Take 1 tablet (75 mg total) by mouth daily.   furosemide  (LASIX ) 20 MG tablet Take 1 tablet (20 mg total) by mouth daily. MAY TAKE AN EXTRA 20  MG AS NEEDED FOR 2 LB WEIGHT GAIN IN 24 OR 5 LB WEIGHT GAIN IN 5 DAYS   meloxicam (MOBIC) 7.5 MG tablet Take 7.5 mg by mouth daily.   metoprolol  succinate (TOPROL -XL) 50 MG 24 hr tablet Take 1 tablet (50 mg total) by mouth daily. Take with or immediately following a meal.   MOUNJARO 5 MG/0.5ML Pen Inject 5 mg into the skin once a week.   nitrofurantoin (MACRODANTIN) 50 MG capsule Take 50 mg by mouth daily.   nitroGLYCERIN  (NITROSTAT ) 0.4 MG SL tablet Place 1 tablet (0.4 mg total) under the tongue every 5 (five) minutes as needed for chest pain.   olmesartan  (BENICAR ) 40 MG tablet Take 1 tablet (40 mg total) by mouth daily.   pantoprazole  (PROTONIX ) 40 MG tablet Take 40 mg by mouth daily.    rosuvastatin  (CRESTOR ) 20 MG tablet Take 1 tablet (20 mg total) by mouth daily.   TRELEGY ELLIPTA 100-62.5-25 MCG/INH AEPB Take 1 puff by mouth daily.   triamcinolone ointment (KENALOG) 0.1 % Apply 7 grams twice daily to affected areas of skin. Stop once resolved and restart as needed for flares. Avoid use on face, armpits, groin unless otherwise indicated.     Allergies:   Pregabalin, Azithromycin , and Doxycycline    Social History   Socioeconomic History   Marital status: Widowed    Spouse name: Not on file   Number of children: 3   Years of education: Not on file   Highest education level: Not on file  Occupational History   Not on file  Tobacco Use   Smoking status: Former    Current packs/day: 0.00    Average packs/day: 0.5 packs/day for 56.0 years (28.0 ttl pk-yrs)    Types: Cigarettes    Start date: 09/10/1964    Quit date: 09/10/2020    Years since quitting: 3.5   Smokeless tobacco: Never  Vaping Use   Vaping status: Never Used  Substance and Sexual Activity   Alcohol use: No    Alcohol/week: 0.0 standard drinks of alcohol   Drug use: No   Sexual activity: Not Currently  Other Topics Concern   Not on file  Social History Narrative   Not on file   Social Drivers of Health   Financial Resource Strain: Low Risk  (08/28/2023)   Overall Financial Resource Strain (CARDIA)    Difficulty of Paying Living Expenses: Not hard at all  Food Insecurity: No Food Insecurity (03/12/2024)   Hunger Vital Sign    Worried About Running Out of Food in the Last Year: Never true    Ran Out of Food in the Last Year: Never true  Transportation Needs: No Transportation Needs (03/12/2024)   PRAPARE - Administrator, Civil Service (Medical): No    Lack of Transportation (Non-Medical): No  Physical Activity: Not on file  Stress: Not on file  Social Connections: Moderately Isolated (03/12/2024)   Social Connection and Isolation Panel    Frequency of Communication with Friends  and Family: More than three times a week    Frequency of Social Gatherings with Friends and Family: Twice a week    Attends Religious Services: Never    Database Administrator or Organizations: No    Attends Engineer, Structural: 1 to 4 times per year    Marital Status: Widowed     Family History: The patient's family history includes Cancer in her brother; Coronary artery disease in her father and mother; Early death in  her father; Heart attack in her father and mother; Hyperlipidemia in her father and mother.  EKGs/Labs/Other Studies Reviewed:    The following studies were reviewed today:       Recent Labs: 10/12/2023: B Natriuretic Peptide 205.5 03/11/2024: Magnesium  2.1 03/26/2024: ALT 11; BUN 27; Creatinine, Ser 1.01; Hemoglobin 10.3; Platelets 306; Potassium 4.4; Sodium 140  Recent Lipid Panel    Component Value Date/Time   CHOL 107 10/02/2019 1228   CHOL 85 03/20/2013 0557   TRIG 183 (H) 09/15/2020 0552   TRIG 112 03/20/2013 0557   HDL 41 10/02/2019 1228   HDL 21 (L) 03/20/2013 0557   CHOLHDL 2.6 10/02/2019 1228   CHOLHDL 3.5 06/05/2016 0911   VLDL 24 06/05/2016 0911   VLDL 22 03/20/2013 0557   LDLCALC 43 10/02/2019 1228   LDLCALC 42 03/20/2013 0557     Physical Exam:    VS:  BP 130/66   Pulse 70   Ht 4' 11 (1.499 m)   Wt 135 lb (61.2 kg)   SpO2 95%   BMI 27.27 kg/m        Wt Readings from Last 3 Encounters:  04/01/24 135 lb (61.2 kg)  03/13/24 138 lb 14.2 oz (63 kg)  03/10/24 135 lb (61.2 kg)     GEN:  Well nourished, well developed in no acute distress HEENT: Normal NECK: Left carotid bruit CARDIAC: S1-S2 normal, RRR, no murmurs, rubs, gallops RESPIRATORY: Mild expiratory wheeze across left lung field throughout, no rubs, rales, rhonchi MUSCULOSKELETAL:  No edema; No deformity  SKIN: Warm and dry NEUROLOGIC:  Alert and oriented x 3 PSYCHIATRIC:  Normal affect       Assessment & Plan Coronary artery disease involving native  coronary artery of native heart, unspecified whether angina present LHC 09/2020: Mild-moderate nonobstructive CAD similar to prior studies when FFR was negative.  Patent proximal RCA stents with mild-moderate in-stent restenosis (30-40%). Denies chest pain, shortness of breath, palpitations, near-syncope, nausea Reports rare left arm pain occasionally on a monthly basis, unsure of etiology, occurring sporadically throughout the year Given multiple potential causes of left arm pain including musculoskeletal, neurological, cardiac, will not pursue further ischemic workup at this time, especially considering lack of associated symptoms and lack of exertional component. Patient advised to continue to monitor arm pain, if symptoms become more frequent/intense, or become associated with other cardiovascular symptoms patient instructed to contact office, as we could consider her for stress testing at that time. ED precautions given. Patient has nitroglycerin , reports she has not used it Reports adherence to DAPT therapy, denies missing any doses, denies abnormal bleeding/bruising Continue aspirin  EC 81 mg daily Continue Plavix  75 mg daily Continue metoprolol  succinate 50 mg daily Continue Crestor  20 mg daily Continue nitroglycerin  0.4 mg SL PRN chest pain every 5 min x 3 doses Wheezing Expiratory wheezing auscultated throughout left lung field Mild, vital stable in clinic, no increased work of breathing observed, denies SOB Patient continues with postviral/pneumonia cough, denies fevers Patient has follow-up with her PCP in 2 weeks, advised to keep follow-up, and to call sooner if symptoms change/worsen or to proceed to urgent care/ED if necessary HFrEF (heart failure with reduced ejection fraction) (HCC) Ischemic cardiomyopathy Echo 10/15/2023: LVEF 55-60%, no RWMA, mild LVH, G1 DD. Denies CP, SOB, palpitations, near syncope, leg swelling Reports her weights have remained stable, and she takes an extra  Lasix  for increased weight/swelling, last extra dose last week Continue Lasix  20 mg daily, with extra dose as needed weight gain Continue  metoprolol  succinate 50 mg daily Continue olmesartan  40 mg daily Bruit of left carotid artery New left carotid bruit Will order carotid Dopplers Primary hypertension BPs reported well-controlled Continue metoprolol  succinate 50 mg daily Continue olmesartan  40 mg daily Hyperlipidemia LDL goal <70 Will need updated lipid panel at follow-up visit Continue rosuvastatin  20 mg daily  Follow-up in 3 months or sooner if symptoms change          Medication Adjustments/Labs and Tests Ordered: Current medicines are reviewed at length with the patient today.  Concerns regarding medicines are outlined above.  Orders Placed This Encounter  Procedures   VAS US  CAROTID   No orders of the defined types were placed in this encounter.   Patient Instructions  Medication Instructions:  Continue current medications.  Remain off Amlodipine.   *If you need a refill on your cardiac medications before your next appointment, please call your pharmacy*  Testing/Procedures: Your physician has requested that you have a carotid duplex. This test is an ultrasound of the carotid arteries in your neck. It looks at blood flow through these arteries that supply the brain with blood. Allow one hour for this exam. There are no restrictions or special instructions.   Follow-Up: At [facility], you and your health needs are our priority.  As part of our continuing mission to provide you with exceptional heart care, our providers are all part of one team.  This team includes your primary Cardiologist (physician) and Advanced Practice Providers or APPs (Physician Assistants and Nurse Practitioners) who all work together to provide you with the care you need, when you need it.  Your next appointment:   3 month(s)  Provider:   Reche Finder, NP    We recommend  signing up for the patient portal called MyChart.  Sign up information is provided on this After Visit Summary.  MyChart is used to connect with patients for Virtual Visits (Telemedicine).  Patients are able to view lab/test results, encounter notes, upcoming appointments, etc.  Non-urgent messages can be sent to your provider as well.   To learn more about what you can do with MyChart, go to [email or web address].   Other Instructions If your shortness of breath or cough does not continue to improve, please reach out to primary care for sooner appointment.           Signed, Miriam FORBES Shams, NP  04/01/2024 5:47 PM    [facility]

## 2024-04-01 ENCOUNTER — Encounter (HOSPITAL_BASED_OUTPATIENT_CLINIC_OR_DEPARTMENT_OTHER): Payer: Self-pay | Admitting: Emergency Medicine

## 2024-04-01 ENCOUNTER — Ambulatory Visit

## 2024-04-01 ENCOUNTER — Ambulatory Visit (INDEPENDENT_AMBULATORY_CARE_PROVIDER_SITE_OTHER): Admitting: Emergency Medicine

## 2024-04-01 VITALS — BP 130/66 | HR 70 | Ht 59.0 in | Wt 135.0 lb

## 2024-04-01 DIAGNOSIS — I2510 Atherosclerotic heart disease of native coronary artery without angina pectoris: Secondary | ICD-10-CM | POA: Diagnosis not present

## 2024-04-01 DIAGNOSIS — I10 Essential (primary) hypertension: Secondary | ICD-10-CM

## 2024-04-01 DIAGNOSIS — R0989 Other specified symptoms and signs involving the circulatory and respiratory systems: Secondary | ICD-10-CM

## 2024-04-01 DIAGNOSIS — E785 Hyperlipidemia, unspecified: Secondary | ICD-10-CM

## 2024-04-01 DIAGNOSIS — R062 Wheezing: Secondary | ICD-10-CM

## 2024-04-01 DIAGNOSIS — I5020 Unspecified systolic (congestive) heart failure: Secondary | ICD-10-CM

## 2024-04-01 DIAGNOSIS — I255 Ischemic cardiomyopathy: Secondary | ICD-10-CM | POA: Diagnosis not present

## 2024-04-01 NOTE — Patient Instructions (Signed)
 Medication Instructions:  Continue current medications.  Remain off Amlodipine.   *If you need a refill on your cardiac medications before your next appointment, please call your pharmacy*  Testing/Procedures: Your physician has requested that you have a carotid duplex. This test is an ultrasound of the carotid arteries in your neck. It looks at blood flow through these arteries that supply the brain with blood. Allow one hour for this exam. There are no restrictions or special instructions.   Follow-Up: At [facility], you and your health needs are our priority.  As part of our continuing mission to provide you with exceptional heart care, our providers are all part of one team.  This team includes your primary Cardiologist (physician) and Advanced Practice Providers or APPs (Physician Assistants and Nurse Practitioners) who all work together to provide you with the care you need, when you need it.  Your next appointment:   3 month(s)  Provider:   Reche Finder, NP    We recommend signing up for the patient portal called MyChart.  Sign up information is provided on this After Visit Summary.  MyChart is used to connect with patients for Virtual Visits (Telemedicine).  Patients are able to view lab/test results, encounter notes, upcoming appointments, etc.  Non-urgent messages can be sent to your provider as well.   To learn more about what you can do with MyChart, go to [email or web address].   Other Instructions If your shortness of breath or cough does not continue to improve, please reach out to primary care for sooner appointment.

## 2024-04-01 NOTE — Assessment & Plan Note (Signed)
 LHC 09/2020: Mild-moderate nonobstructive CAD similar to prior studies when FFR was negative.  Patent proximal RCA stents with mild-moderate in-stent restenosis (30-40%). Denies chest pain, shortness of breath, palpitations, near-syncope, nausea Reports rare left arm pain occasionally on a monthly basis, unsure of etiology, occurring sporadically throughout the year Given multiple potential causes of left arm pain including musculoskeletal, neurological, cardiac, will not pursue further ischemic workup at this time, especially considering lack of associated symptoms and lack of exertional component. Patient advised to continue to monitor arm pain, if symptoms become more frequent/intense, or become associated with other cardiovascular symptoms patient instructed to contact office, as we could consider her for stress testing at that time. ED precautions given. Patient has nitroglycerin , reports she has not used it Reports adherence to DAPT therapy, denies missing any doses, denies abnormal bleeding/bruising Continue aspirin  EC 81 mg daily Continue Plavix  75 mg daily Continue metoprolol  succinate 50 mg daily Continue Crestor  20 mg daily Continue nitroglycerin  0.4 mg SL PRN chest pain every 5 min x 3 doses

## 2024-04-01 NOTE — Assessment & Plan Note (Signed)
 Will need updated lipid panel at follow-up visit Continue rosuvastatin  20 mg daily

## 2024-04-01 NOTE — Assessment & Plan Note (Signed)
 BPs reported well-controlled Continue metoprolol  succinate 50 mg daily Continue olmesartan  40 mg daily

## 2024-04-01 NOTE — Assessment & Plan Note (Signed)
 Echo 10/15/2023: LVEF 55-60%, no RWMA, mild LVH, G1 DD. Denies CP, SOB, palpitations, near syncope, leg swelling Reports her weights have remained stable, and she takes an extra Lasix  for increased weight/swelling, last extra dose last week Continue Lasix  20 mg daily, with extra dose as needed weight gain Continue metoprolol  succinate 50 mg daily Continue olmesartan  40 mg daily

## 2024-04-02 ENCOUNTER — Other Ambulatory Visit: Payer: Self-pay

## 2024-04-02 DIAGNOSIS — R1310 Dysphagia, unspecified: Secondary | ICD-10-CM

## 2024-04-07 ENCOUNTER — Ambulatory Visit
Admission: RE | Admit: 2024-04-07 | Discharge: 2024-04-07 | Disposition: A | Discharge status: Home / Self Care | Source: Ambulatory Visit

## 2024-04-07 DIAGNOSIS — R1310 Dysphagia, unspecified: Secondary | ICD-10-CM | POA: Insufficient documentation

## 2024-04-08 ENCOUNTER — Ambulatory Visit: Admitting: Physical Therapy

## 2024-04-15 ENCOUNTER — Other Ambulatory Visit: Payer: Self-pay | Admitting: Nurse Practitioner

## 2024-04-15 ENCOUNTER — Ambulatory Visit
Admission: RE | Admit: 2024-04-15 | Discharge: 2024-04-15 | Disposition: A | Attending: Nurse Practitioner | Admitting: Nurse Practitioner

## 2024-04-15 ENCOUNTER — Ambulatory Visit: Admitting: Physical Therapy

## 2024-04-15 ENCOUNTER — Ambulatory Visit
Admission: RE | Admit: 2024-04-15 | Discharge: 2024-04-15 | Disposition: A | Source: Ambulatory Visit | Attending: Nurse Practitioner | Admitting: Nurse Practitioner

## 2024-04-15 DIAGNOSIS — Z8701 Personal history of pneumonia (recurrent): Secondary | ICD-10-CM

## 2024-04-17 ENCOUNTER — Encounter: Payer: Self-pay | Admitting: Pulmonary Disease

## 2024-04-17 ENCOUNTER — Ambulatory Visit: Admitting: Pulmonary Disease

## 2024-04-17 VITALS — BP 130/64 | HR 73 | Temp 98.1°F | Ht 59.0 in | Wt 137.0 lb

## 2024-04-17 DIAGNOSIS — R911 Solitary pulmonary nodule: Secondary | ICD-10-CM

## 2024-04-17 DIAGNOSIS — J449 Chronic obstructive pulmonary disease, unspecified: Secondary | ICD-10-CM

## 2024-04-17 DIAGNOSIS — Z8701 Personal history of pneumonia (recurrent): Secondary | ICD-10-CM

## 2024-04-17 DIAGNOSIS — K225 Diverticulum of esophagus, acquired: Secondary | ICD-10-CM

## 2024-04-17 DIAGNOSIS — R1310 Dysphagia, unspecified: Secondary | ICD-10-CM | POA: Diagnosis not present

## 2024-04-17 DIAGNOSIS — T17908D Unspecified foreign body in respiratory tract, part unspecified causing other injury, subsequent encounter: Secondary | ICD-10-CM

## 2024-04-17 NOTE — Progress Notes (Signed)
 Subjective:    Patient ID: Kathryn Garrett, female    DOB: Sep 14, 1945, 78 y.o.   MRN: [id number]  Patient Care Team: Silvano Angeline FALCON, NP as PCP - General Vannie Reche RAMAN, NP as Nurse Practitioner (Cardiology) Jacobo Evalene PARAS, MD as Consulting Physician (Oncology)  Chief Complaint  Patient presents with   COPD    No cough or wheezing. No shortness of breath.  Was in the hospital 03/11/2024- 03/13/21 due to pneumonia.     BACKGROUND/INTERVAL: Patient is a 78 year old former smoker with a history of COPD on the basis of emphysema and a history of lung nodule who presents for follow-up.  She was initially seen here on 29 January 2024 for issues related to a lung nodule seen on lung cancer screening CT.  She presents today for follow-up and recalls a recent hospitalization at [facility] from 28 October through 13 March 2024 due to recurrent aspiration pneumonia.  HPI Discussed the use of AI scribe software for clinical note transcription with the patient, who gave verbal consent to proceed.  History of Present Illness   Kathryn Garrett is a 78 year old female with mild COPD who presents for a follow-up visit.  She has experienced pneumonia twice since her last visit, which she attributes to swallowing difficulties leading to aspiration. She has started using a thickener in her water  to help prevent further episodes of pneumonia.  Esophagogram performed 24 November showed silent aspiration with thin barium.  She mentions having had pneumonia seven times since discontinuing the use of a thickener previously. There is a noted diverticulum in her esophagus, and she sometimes feels like things get stuck when swallowing.  She is currently using an inhaler (Trelegy) and reports doing well with it. She recently had a chest x-ray at the hospital.  She is unsure about upcoming blood work but mentions that she typically gets blood work done at [place] [employer] visit.       Review of  Systems A 10 point review of systems was performed and it is as noted above otherwise negative.   Patient Active Problem List   Diagnosis Date Noted   Acute hypoxic respiratory failure (HCC) 03/11/2024   Fever 10/16/2023   Hypoxia 10/16/2023   Sepsis due to pneumonia (HCC) 10/13/2023   Hypothyroidism 10/13/2023   Essential hypertension 10/13/2023   Coronary artery disease 10/13/2023   SVT (supraventricular tachycardia)    Acute combined systolic and diastolic heart failure (HCC)    Pure hypercholesterolemia    Acute on chronic HFrEF (heart failure with reduced ejection fraction) (HCC)    Acute respiratory failure (HCC) 09/11/2020   HFrEF (heart failure with reduced ejection fraction) (HCC)    Acute exacerbation of chronic obstructive pulmonary disease (COPD) (HCC) 09/10/2020   Severe sepsis (HCC) 09/10/2020   NSTEMI (non-ST elevated myocardial infarction) (HCC) 09/10/2020   Hyperglycemia 09/10/2020   SOB (shortness of breath) 09/10/2020   Chronic obstructive pulmonary disease (HCC) 03/05/2018   History of opioid abuse (HCC) 03/05/2018   Nocturnal hypoxemia 10/30/2017   Peripheral arterial disease 08/30/2016   Chest pain 06/04/2016   S/P PTCA (percutaneous transluminal coronary angioplasty) 02/10/16 to RCA lesion for in stent restenois 02/11/2016   Unstable angina (HCC)    Hyperlipidemia LDL goal <70 01/28/2016   Presence of coronary angioplasty implant and graft 10/15/2015   Tobacco abuse 10/15/2015   CAP (community acquired pneumonia) 09/24/2015   CAD in native artery 09/24/2015   HTN (hypertension) 09/24/2015   Lung nodule, solitary  03/31/2014   Liver nodule 03/31/2014    Social History   Tobacco Use   Smoking status: Former    Current packs/day: 0.00    Average packs/day: 0.5 packs/day for 56.0 years (28.0 ttl pk-yrs)    Types: Cigarettes    Start date: 09/10/1964    Quit date: 09/10/2020    Years since quitting: 3.6   Smokeless tobacco: Never  Substance Use Topics    Alcohol use: No    Alcohol/week: 0.0 standard drinks of alcohol    Allergies  Allergen Reactions   Pregabalin Other (See Comments)    'Makes me extremely sedated'   Azithromycin  Rash   Doxycycline  Nausea Only    Current Meds  Medication Sig   acetaminophen  (TYLENOL ) 500 MG tablet Take 1,000 mg by mouth every 6 (six) hours as needed for headache.   albuterol  (PROVENTIL  HFA;VENTOLIN  HFA) 108 (90 Base) MCG/ACT inhaler Inhale 2 puffs into the lungs every 4 (four) hours as needed for wheezing or shortness of breath.   aspirin  EC 81 MG tablet Take 81 mg by mouth at bedtime.    Buprenorphine  HCl-Naloxone  HCl 8-2 MG FILM Place 1 Film under the tongue daily.   clopidogrel  (PLAVIX ) 75 MG tablet Take 1 tablet (75 mg total) by mouth daily.   furosemide  (LASIX ) 20 MG tablet Take 1 tablet (20 mg total) by mouth daily. MAY TAKE AN EXTRA 20 MG AS NEEDED FOR 2 LB WEIGHT GAIN IN 24 OR 5 LB WEIGHT GAIN IN 5 DAYS   meloxicam (MOBIC) 7.5 MG tablet Take 7.5 mg by mouth daily.   metoprolol  succinate (TOPROL -XL) 50 MG 24 hr tablet Take 1 tablet (50 mg total) by mouth daily. Take with or immediately following a meal.   MOUNJARO 5 MG/0.5ML Pen Inject 5 mg into the skin once a week.   nitrofurantoin (MACRODANTIN) 50 MG capsule Take 50 mg by mouth daily.   nitroGLYCERIN  (NITROSTAT ) 0.4 MG SL tablet Place 1 tablet (0.4 mg total) under the tongue every 5 (five) minutes as needed for chest pain.   olmesartan  (BENICAR ) 40 MG tablet Take 1 tablet (40 mg total) by mouth daily.   pantoprazole  (PROTONIX ) 40 MG tablet Take 40 mg by mouth daily.   rosuvastatin  (CRESTOR ) 20 MG tablet Take 1 tablet (20 mg total) by mouth daily.   TRELEGY ELLIPTA 100-62.5-25 MCG/INH AEPB Take 1 puff by mouth daily.   triamcinolone  ointment (KENALOG ) 0.1 % Apply 7 grams twice daily to affected areas of skin. Stop once resolved and restart as needed for flares. Avoid use on face, armpits, groin unless otherwise indicated.    Immunization  History  Administered Date(s) Administered   INFLUENZA, HIGH DOSE SEASONAL PF 02/27/2019   Influenza,inj,Quad PF,6+ Mos 03/05/2018        Objective:     Vitals:   04/17/24 1342  BP: 130/64  Pulse: 73  Temp: 98.1 F (36.7 C)  Height: 4' 11 (1.499 m)  Weight: 137 lb (62.1 kg)  SpO2: 98%  TempSrc: Temporal  BMI (Calculated): 27.66     SpO2: 98 %  GENERAL: Well-developed, well-nourished elderly woman, no acute distress.  Very well-groomed.  No conversational dyspnea.  Fully ambulatory. HEAD: Normocephalic, atraumatic.  EYES: Pupils equal, round, reactive to light.  No scleral icterus.  MOUTH: Upper dentures, missing lower teeth, oral mucosa moist.  No thrush. NECK: Supple. No thyromegaly. Trachea midline. No JVD.  No adenopathy. PULMONARY: Good air entry bilaterally.  Coarse, otherwise, no adventitious sounds. CARDIOVASCULAR: S1 and S2. Regular  rate and rhythm.  No rubs, murmurs or gallops heard. ABDOMEN: Benign. MUSCULOSKELETAL: No joint deformity, no clubbing, no edema.  NEUROLOGIC: No overt focal deficit, no gait disturbance, speech is fluent. SKIN: Intact,warm,dry. PSYCH: Mood and behavior normal.   Chest x-ray performed to December 2025 shows no acute cardiopulmonary process:        Assessment & Plan:     ICD-10-CM   1. Stage 1 mild COPD by GOLD classification (HCC)  J44.9     2. Lung nodule seen on imaging study  R91.1     3. Chronic pulmonary aspiration, subsequent encounter  T17.908D      Discussion:    Mild chronic obstructive pulmonary disease (COPD) Mild COPD with good lung function. Inhaler use is effective. - Continue current inhaler regimen  Dysphagia with aspiration risk Dysphagia with aspiration risk, likely contributing to recurrent pneumonia. Use of thickener in liquids to reduce aspiration risk. No significant improvement in swallowing function reported. - Continue use of thickener in liquids  History of recurrent pneumonia secondary to  aspiration Recurrent pneumonia secondary to aspiration, likely due to dysphagia. Recent chest x-ray shows resolution of pneumonia. Continued risk of aspiration due to dysphagia. - Continue to monitor for signs of recurrent pneumonia - Will consider gastroenterology referral if aspiration issues persist  Esophageal diverticulum Presence of esophageal diverticulum with occasional sensation of food impaction. Potential need for EGD if symptoms worsen. - Will consider gastroenterology referral for evaluation of esophageal diverticulum if symptoms worsen     Will see the patient in follow-up in 2 months time call sooner should any new problems arise.  Advised if symptoms do not improve or worsen, to please contact office for sooner follow up or seek emergency care.    I spent 33 minutes of dedicated to the care of this patient on the date of this encounter to include pre-visit review of records, face-to-face time with the patient discussing conditions above, post visit ordering of testing, clinical documentation with the electronic health record, making appropriate referrals as documented, and communicating necessary findings to members of the patients care team.     C. Leita Sanders, MD Advanced Bronchoscopy PCCM [facility] [place]    *This note was generated using voice recognition software/Dragon and/or AI transcription program.  Despite best efforts to proofread, errors can occur which can change the meaning. Any transcriptional errors that result from this process are unintentional and may not be fully corrected at the time of dictation.

## 2024-04-17 NOTE — Patient Instructions (Signed)
 VISIT SUMMARY:  During today's visit, we discussed your mild COPD, swallowing difficulties, and history of recurrent pneumonia. You have been using a thickener in your water  to help with swallowing and prevent pneumonia. Your recent chest x-ray showed that your pneumonia has resolved. We also talked about the presence of an esophageal diverticulum and the occasional sensation of food getting stuck when you swallow.  YOUR PLAN:  -MILD CHRONIC OBSTRUCTIVE PULMONARY DISEASE (COPD): COPD is a chronic lung condition that makes it hard to breathe. Your lung function is good, and your current inhaler is working well. Please continue using your inhaler as prescribed.  -DYSPHAGIA WITH ASPIRATION RISK: Dysphagia means difficulty swallowing, which can lead to food or liquid entering your lungs (aspiration) and cause pneumonia. Continue using the thickener in your liquids to reduce this risk.  -HISTORY OF RECURRENT PNEUMONIA SECONDARY TO ASPIRATION: You have had multiple episodes of pneumonia due to aspiration from swallowing difficulties. Your recent chest x-ray shows that your pneumonia has cleared up. We will keep monitoring for any signs of pneumonia and may refer you to a gastroenterologist if the aspiration continues.  -ESOPHAGEAL DIVERTICULUM: An esophageal diverticulum is a pouch in the esophagus that can cause food to get stuck. If your symptoms get worse, we may refer you to a gastroenterologist for further evaluation and possible treatment.  INSTRUCTIONS:  Please continue using your inhaler and the thickener in your liquids as discussed. Monitor for any signs of pneumonia, such as cough, fever, or difficulty breathing, and contact us  if these occur. If your swallowing difficulties or the sensation of food getting stuck worsen, let us  know so we can consider a referral to a gastroenterologist. Follow up with your routine blood work as usual.

## 2024-04-18 ENCOUNTER — Encounter: Payer: Self-pay | Admitting: Pulmonary Disease

## 2024-04-21 ENCOUNTER — Encounter (INDEPENDENT_AMBULATORY_CARE_PROVIDER_SITE_OTHER)

## 2024-04-21 DIAGNOSIS — R0989 Other specified symptoms and signs involving the circulatory and respiratory systems: Secondary | ICD-10-CM

## 2024-04-22 ENCOUNTER — Ambulatory Visit: Admitting: Physical Therapy

## 2024-04-23 ENCOUNTER — Ambulatory Visit: Payer: Self-pay | Admitting: Emergency Medicine

## 2024-04-23 NOTE — Telephone Encounter (Signed)
 LVM asking pt to call our office to discuss test results. 1st attempt

## 2024-04-23 NOTE — Telephone Encounter (Signed)
 Patient is returning call.

## 2024-04-29 ENCOUNTER — Ambulatory Visit: Admitting: Physical Therapy

## 2024-05-06 ENCOUNTER — Ambulatory Visit: Admitting: Physical Therapy

## 2024-05-13 ENCOUNTER — Ambulatory Visit: Admitting: Physical Therapy

## 2024-05-20 ENCOUNTER — Ambulatory Visit
Admission: RE | Admit: 2024-05-20 | Discharge: 2024-05-20 | Disposition: A | Discharge status: Home / Self Care | Source: Ambulatory Visit

## 2024-05-20 DIAGNOSIS — R0989 Other specified symptoms and signs involving the circulatory and respiratory systems: Secondary | ICD-10-CM

## 2024-06-23 ENCOUNTER — Ambulatory Visit: Admitting: Pulmonary Disease

## 2024-07-08 ENCOUNTER — Ambulatory Visit (HOSPITAL_BASED_OUTPATIENT_CLINIC_OR_DEPARTMENT_OTHER): Admitting: Family
